# Patient Record
Sex: Male | Born: 1950
Health system: Southern US, Community
[De-identification: ages and names within clinical notes are randomized; demographics above are authoritative.]

## PROBLEM LIST (undated history)

## (undated) DIAGNOSIS — T402X5A Adverse effect of other opioids, initial encounter: Secondary | ICD-10-CM

## (undated) DIAGNOSIS — R319 Hematuria, unspecified: Secondary | ICD-10-CM

## (undated) DIAGNOSIS — E785 Hyperlipidemia, unspecified: Secondary | ICD-10-CM

## (undated) DIAGNOSIS — F329 Major depressive disorder, single episode, unspecified: Secondary | ICD-10-CM

## (undated) DIAGNOSIS — K219 Gastro-esophageal reflux disease without esophagitis: Secondary | ICD-10-CM

## (undated) DIAGNOSIS — C9110 Chronic lymphocytic leukemia of B-cell type not having achieved remission: Secondary | ICD-10-CM

## (undated) DIAGNOSIS — K5900 Constipation, unspecified: Secondary | ICD-10-CM

## (undated) DIAGNOSIS — I10 Essential (primary) hypertension: Secondary | ICD-10-CM

## (undated) DIAGNOSIS — E119 Type 2 diabetes mellitus without complications: Secondary | ICD-10-CM

## (undated) DIAGNOSIS — F32A Depression, unspecified: Secondary | ICD-10-CM

## (undated) DIAGNOSIS — K5903 Drug induced constipation: Secondary | ICD-10-CM

## (undated) HISTORY — PX: CATARACT EXTRACTION W/ INTRAOCULAR LENS IMPLANT: SHX1309

## (undated) HISTORY — DX: Chronic lymphocytic leukemia of B-cell type not having achieved remission: C91.10

## (undated) HISTORY — DX: Drug induced constipation: K59.03

## (undated) HISTORY — DX: Adverse effect of other opioids, initial encounter: T40.2X5A

## (undated) HISTORY — DX: Hematuria, unspecified: R31.9

## (undated) HISTORY — DX: Essential (primary) hypertension: I10

## (undated) HISTORY — DX: Hyperlipidemia, unspecified: E78.5

## (undated) HISTORY — DX: Constipation, unspecified: K59.00

## (undated) HISTORY — DX: Depression, unspecified: F32.A

---
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
## 1981-04-29 HISTORY — PX: CHOLECYSTECTOMY: SHX55

## 2004-08-17 ENCOUNTER — Ambulatory Visit: Payer: Self-pay | Admitting: Urology

## 2004-09-04 ENCOUNTER — Other Ambulatory Visit: Payer: Self-pay

## 2004-09-10 ENCOUNTER — Ambulatory Visit: Payer: Self-pay | Admitting: Urology

## 2004-10-31 ENCOUNTER — Ambulatory Visit: Payer: Self-pay | Admitting: Internal Medicine

## 2004-11-07 ENCOUNTER — Ambulatory Visit: Payer: Self-pay | Admitting: Urology

## 2004-11-27 ENCOUNTER — Ambulatory Visit: Payer: Self-pay | Admitting: Internal Medicine

## 2004-12-28 ENCOUNTER — Ambulatory Visit: Payer: Self-pay | Admitting: Internal Medicine

## 2005-06-05 ENCOUNTER — Ambulatory Visit: Payer: Self-pay | Admitting: Internal Medicine

## 2005-06-19 ENCOUNTER — Ambulatory Visit: Payer: Self-pay | Admitting: Internal Medicine

## 2005-07-17 ENCOUNTER — Ambulatory Visit: Payer: Self-pay | Admitting: Internal Medicine

## 2005-09-18 ENCOUNTER — Ambulatory Visit: Payer: Self-pay | Admitting: Internal Medicine

## 2005-09-27 ENCOUNTER — Ambulatory Visit: Payer: Self-pay | Admitting: Internal Medicine

## 2005-12-11 ENCOUNTER — Ambulatory Visit: Payer: Self-pay | Admitting: Internal Medicine

## 2005-12-28 ENCOUNTER — Ambulatory Visit: Payer: Self-pay | Admitting: Internal Medicine

## 2006-06-28 ENCOUNTER — Ambulatory Visit: Payer: Self-pay | Admitting: Internal Medicine

## 2006-07-02 ENCOUNTER — Ambulatory Visit: Payer: Self-pay | Admitting: Internal Medicine

## 2006-07-29 ENCOUNTER — Ambulatory Visit: Payer: Self-pay | Admitting: Internal Medicine

## 2006-10-15 ENCOUNTER — Ambulatory Visit: Payer: Self-pay | Admitting: Internal Medicine

## 2006-10-28 ENCOUNTER — Ambulatory Visit: Payer: Self-pay | Admitting: Internal Medicine

## 2007-01-05 ENCOUNTER — Ambulatory Visit: Payer: Self-pay | Admitting: Internal Medicine

## 2007-01-06 ENCOUNTER — Ambulatory Visit: Payer: Self-pay | Admitting: Internal Medicine

## 2007-02-17 ENCOUNTER — Ambulatory Visit: Payer: Self-pay | Admitting: Unknown Physician Specialty

## 2007-07-29 ENCOUNTER — Ambulatory Visit: Payer: Self-pay | Admitting: Internal Medicine

## 2007-08-28 ENCOUNTER — Ambulatory Visit: Payer: Self-pay | Admitting: Internal Medicine

## 2007-09-30 ENCOUNTER — Ambulatory Visit: Payer: Self-pay | Admitting: Internal Medicine

## 2007-10-28 ENCOUNTER — Ambulatory Visit: Payer: Self-pay | Admitting: Internal Medicine

## 2007-12-02 ENCOUNTER — Ambulatory Visit: Payer: Self-pay | Admitting: Internal Medicine

## 2007-12-29 ENCOUNTER — Ambulatory Visit: Payer: Self-pay | Admitting: Internal Medicine

## 2008-01-28 ENCOUNTER — Ambulatory Visit: Payer: Self-pay | Admitting: Internal Medicine

## 2008-02-03 ENCOUNTER — Ambulatory Visit: Payer: Self-pay | Admitting: Internal Medicine

## 2008-02-28 ENCOUNTER — Ambulatory Visit: Payer: Self-pay | Admitting: Internal Medicine

## 2008-04-29 ENCOUNTER — Ambulatory Visit: Payer: Self-pay | Admitting: Internal Medicine

## 2008-05-18 ENCOUNTER — Ambulatory Visit: Payer: Self-pay | Admitting: Internal Medicine

## 2008-05-30 ENCOUNTER — Ambulatory Visit: Payer: Self-pay | Admitting: Internal Medicine

## 2008-07-28 ENCOUNTER — Ambulatory Visit: Payer: Self-pay | Admitting: Internal Medicine

## 2008-08-10 ENCOUNTER — Ambulatory Visit: Payer: Self-pay | Admitting: Internal Medicine

## 2008-08-27 ENCOUNTER — Ambulatory Visit: Payer: Self-pay | Admitting: Internal Medicine

## 2008-09-29 ENCOUNTER — Ambulatory Visit: Payer: Self-pay | Admitting: Unknown Physician Specialty

## 2008-11-09 ENCOUNTER — Ambulatory Visit: Payer: Self-pay | Admitting: Ophthalmology

## 2008-11-22 ENCOUNTER — Ambulatory Visit: Payer: Self-pay | Admitting: Ophthalmology

## 2009-02-08 ENCOUNTER — Ambulatory Visit: Payer: Self-pay | Admitting: Internal Medicine

## 2009-02-27 ENCOUNTER — Ambulatory Visit: Payer: Self-pay | Admitting: Internal Medicine

## 2009-07-28 ENCOUNTER — Ambulatory Visit: Payer: Self-pay | Admitting: Internal Medicine

## 2009-08-27 ENCOUNTER — Ambulatory Visit: Payer: Self-pay | Admitting: Internal Medicine

## 2009-09-27 ENCOUNTER — Ambulatory Visit: Payer: Self-pay | Admitting: Internal Medicine

## 2009-10-27 ENCOUNTER — Ambulatory Visit: Payer: Self-pay | Admitting: Internal Medicine

## 2009-11-27 ENCOUNTER — Ambulatory Visit: Payer: Self-pay | Admitting: Internal Medicine

## 2009-12-28 ENCOUNTER — Ambulatory Visit: Payer: Self-pay | Admitting: Internal Medicine

## 2010-01-27 ENCOUNTER — Ambulatory Visit: Payer: Self-pay | Admitting: Internal Medicine

## 2010-02-27 ENCOUNTER — Ambulatory Visit: Payer: Self-pay | Admitting: Internal Medicine

## 2010-03-29 ENCOUNTER — Ambulatory Visit: Payer: Self-pay | Admitting: Internal Medicine

## 2010-04-29 ENCOUNTER — Ambulatory Visit: Payer: Self-pay | Admitting: Internal Medicine

## 2010-05-30 ENCOUNTER — Ambulatory Visit: Payer: Self-pay | Admitting: Internal Medicine

## 2010-06-26 ENCOUNTER — Ambulatory Visit: Payer: Self-pay | Admitting: Internal Medicine

## 2010-07-03 ENCOUNTER — Ambulatory Visit: Payer: Self-pay | Admitting: Internal Medicine

## 2010-07-29 ENCOUNTER — Ambulatory Visit: Payer: Self-pay | Admitting: Internal Medicine

## 2010-08-28 ENCOUNTER — Ambulatory Visit: Payer: Self-pay | Admitting: Internal Medicine

## 2010-10-04 ENCOUNTER — Ambulatory Visit: Payer: Self-pay | Admitting: Internal Medicine

## 2010-10-28 ENCOUNTER — Ambulatory Visit: Payer: Self-pay | Admitting: Internal Medicine

## 2010-11-28 ENCOUNTER — Ambulatory Visit: Payer: Self-pay | Admitting: Internal Medicine

## 2010-12-26 ENCOUNTER — Ambulatory Visit: Payer: Self-pay | Admitting: Physician Assistant

## 2010-12-29 ENCOUNTER — Ambulatory Visit: Payer: Self-pay | Admitting: Internal Medicine

## 2011-01-28 ENCOUNTER — Ambulatory Visit: Payer: Self-pay | Admitting: Internal Medicine

## 2011-03-28 ENCOUNTER — Ambulatory Visit: Payer: Self-pay | Admitting: Internal Medicine

## 2011-03-30 ENCOUNTER — Ambulatory Visit: Payer: Self-pay | Admitting: Internal Medicine

## 2011-06-19 ENCOUNTER — Ambulatory Visit: Payer: Self-pay | Admitting: Internal Medicine

## 2011-06-19 LAB — CBC CANCER CENTER
Basophil %: 1.4 %
Eosinophil #: 0.3 x10 3/mm (ref 0.0–0.7)
Eosinophil %: 5.6 %
HCT: 36.3 % — ABNORMAL LOW (ref 40.0–52.0)
HGB: 12 g/dL — ABNORMAL LOW (ref 13.0–18.0)
Lymphocyte %: 23.4 %
MCH: 33.6 pg (ref 26.0–34.0)
Monocyte %: 11.9 %
Neutrophil %: 57.7 %
Platelet: 114 x10 3/mm — ABNORMAL LOW (ref 150–440)
RBC: 3.57 10*6/uL — ABNORMAL LOW (ref 4.40–5.90)

## 2011-06-28 ENCOUNTER — Ambulatory Visit: Payer: Self-pay | Admitting: Internal Medicine

## 2011-09-11 ENCOUNTER — Ambulatory Visit: Payer: Self-pay | Admitting: Internal Medicine

## 2011-09-11 LAB — CBC CANCER CENTER
Basophil #: 0.2 x10 3/mm — ABNORMAL HIGH (ref 0.0–0.1)
Eosinophil #: 0.2 x10 3/mm (ref 0.0–0.7)
HCT: 35.6 % — ABNORMAL LOW (ref 40.0–52.0)
Lymphocyte %: 25.8 %
MCH: 33.7 pg (ref 26.0–34.0)
MCHC: 32.1 g/dL (ref 32.0–36.0)
Neutrophil #: 3.3 x10 3/mm (ref 1.4–6.5)
RBC: 3.39 10*6/uL — ABNORMAL LOW (ref 4.40–5.90)
WBC: 5.7 x10 3/mm (ref 3.8–10.6)

## 2011-09-11 LAB — HEPATIC FUNCTION PANEL A (ARMC)
Alkaline Phosphatase: 98 U/L (ref 50–136)
Bilirubin, Direct: 0.1 mg/dL (ref 0.00–0.20)
Bilirubin,Total: 0.3 mg/dL (ref 0.2–1.0)
SGPT (ALT): 38 U/L
Total Protein: 7.1 g/dL (ref 6.4–8.2)

## 2011-09-11 LAB — LACTATE DEHYDROGENASE: LDH: 138 U/L (ref 87–241)

## 2011-09-28 ENCOUNTER — Ambulatory Visit: Payer: Self-pay | Admitting: Internal Medicine

## 2011-12-04 ENCOUNTER — Ambulatory Visit: Payer: Self-pay | Admitting: Internal Medicine

## 2011-12-04 LAB — CBC CANCER CENTER
Basophil %: 1.2 %
Eosinophil %: 3.1 %
HCT: 35.1 % — ABNORMAL LOW (ref 40.0–52.0)
Lymphocyte #: 2.1 x10 3/mm (ref 1.0–3.6)
MCH: 34.6 pg — ABNORMAL HIGH (ref 26.0–34.0)
MCV: 105 fL — ABNORMAL HIGH (ref 80–100)
Monocyte #: 0.7 x10 3/mm (ref 0.2–1.0)
Monocyte %: 9.4 %
Neutrophil #: 4.2 x10 3/mm (ref 1.4–6.5)
Platelet: 137 x10 3/mm — ABNORMAL LOW (ref 150–440)
RBC: 3.33 10*6/uL — ABNORMAL LOW (ref 4.40–5.90)
RDW: 17.1 % — ABNORMAL HIGH (ref 11.5–14.5)
WBC: 7.3 x10 3/mm (ref 3.8–10.6)

## 2011-12-29 ENCOUNTER — Ambulatory Visit: Payer: Self-pay | Admitting: Internal Medicine

## 2011-12-30 ENCOUNTER — Emergency Department: Payer: Self-pay | Admitting: Emergency Medicine

## 2012-03-04 ENCOUNTER — Ambulatory Visit: Payer: Self-pay | Admitting: Internal Medicine

## 2012-03-04 LAB — HEPATIC FUNCTION PANEL A (ARMC)
Albumin: 3.6 g/dL (ref 3.4–5.0)
Alkaline Phosphatase: 99 U/L (ref 50–136)
Bilirubin, Direct: 0.1 mg/dL (ref 0.00–0.20)
Bilirubin,Total: 0.4 mg/dL (ref 0.2–1.0)
SGPT (ALT): 40 U/L (ref 12–78)
Total Protein: 7.3 g/dL (ref 6.4–8.2)

## 2012-03-04 LAB — LACTATE DEHYDROGENASE: LDH: 132 U/L (ref 85–241)

## 2012-03-04 LAB — CBC CANCER CENTER
Basophil #: 0.2 x10 3/mm — ABNORMAL HIGH (ref 0.0–0.1)
Basophil %: 1.6 %
Eosinophil #: 0.2 x10 3/mm (ref 0.0–0.7)
HCT: 34.6 % — ABNORMAL LOW (ref 40.0–52.0)
HGB: 11 g/dL — ABNORMAL LOW (ref 13.0–18.0)
Lymphocyte #: 4.4 x10 3/mm — ABNORMAL HIGH (ref 1.0–3.6)
Lymphocyte %: 42.5 %
MCH: 32.8 pg (ref 26.0–34.0)
MCHC: 31.9 g/dL — ABNORMAL LOW (ref 32.0–36.0)
Monocyte #: 0.6 x10 3/mm (ref 0.2–1.0)
Neutrophil #: 5 x10 3/mm (ref 1.4–6.5)
RBC: 3.36 10*6/uL — ABNORMAL LOW (ref 4.40–5.90)
RDW: 16.6 % — ABNORMAL HIGH (ref 11.5–14.5)

## 2012-03-04 LAB — CREATININE, SERUM: Creatinine: 1.56 mg/dL — ABNORMAL HIGH (ref 0.60–1.30)

## 2012-03-29 ENCOUNTER — Ambulatory Visit: Payer: Self-pay | Admitting: Internal Medicine

## 2012-04-29 ENCOUNTER — Ambulatory Visit: Payer: Self-pay | Admitting: Internal Medicine

## 2012-05-27 LAB — CBC CANCER CENTER
Eosinophil #: 0.3 x10 3/mm (ref 0.0–0.7)
Eosinophil %: 1.7 %
HCT: 34.8 % — ABNORMAL LOW (ref 40.0–52.0)
HGB: 11.4 g/dL — ABNORMAL LOW (ref 13.0–18.0)
Lymphocyte #: 10.8 x10 3/mm — ABNORMAL HIGH (ref 1.0–3.6)
Lymphocyte %: 63.9 %
MCH: 32.9 pg (ref 26.0–34.0)
MCHC: 32.7 g/dL (ref 32.0–36.0)
MCV: 101 fL — ABNORMAL HIGH (ref 80–100)
Neutrophil #: 5 x10 3/mm (ref 1.4–6.5)
RBC: 3.45 10*6/uL — ABNORMAL LOW (ref 4.40–5.90)
RDW: 16.6 % — ABNORMAL HIGH (ref 11.5–14.5)

## 2012-05-30 ENCOUNTER — Ambulatory Visit: Payer: Self-pay | Admitting: Internal Medicine

## 2012-06-04 ENCOUNTER — Ambulatory Visit: Payer: Self-pay | Admitting: Unknown Physician Specialty

## 2012-07-07 ENCOUNTER — Ambulatory Visit: Payer: Self-pay | Admitting: Internal Medicine

## 2012-08-18 ENCOUNTER — Ambulatory Visit: Payer: Self-pay | Admitting: Internal Medicine

## 2012-08-19 LAB — CBC CANCER CENTER
Basophil #: 0.1 x10 3/mm (ref 0.0–0.1)
Basophil %: 0.5 %
Eosinophil #: 0.4 x10 3/mm (ref 0.0–0.7)
Eosinophil %: 1.4 %
HCT: 34.9 % — ABNORMAL LOW (ref 40.0–52.0)
HGB: 11.2 g/dL — ABNORMAL LOW (ref 13.0–18.0)
Lymphocyte %: 75.7 %
MCH: 32.1 pg (ref 26.0–34.0)
MCV: 100 fL (ref 80–100)
Monocyte #: 0.9 x10 3/mm (ref 0.2–1.0)
Neutrophil #: 4.8 x10 3/mm (ref 1.4–6.5)
Platelet: 141 x10 3/mm — ABNORMAL LOW (ref 150–440)
RBC: 3.48 10*6/uL — ABNORMAL LOW (ref 4.40–5.90)
RDW: 18.1 % — ABNORMAL HIGH (ref 11.5–14.5)
WBC: 25.5 x10 3/mm — ABNORMAL HIGH (ref 3.8–10.6)

## 2012-08-19 LAB — HEPATIC FUNCTION PANEL A (ARMC)
Albumin: 3.9 g/dL (ref 3.4–5.0)
Alkaline Phosphatase: 104 U/L (ref 50–136)
Bilirubin,Total: 0.5 mg/dL (ref 0.2–1.0)
SGOT(AST): 20 U/L (ref 15–37)
SGPT (ALT): 26 U/L (ref 12–78)
Total Protein: 7.6 g/dL (ref 6.4–8.2)

## 2012-08-19 LAB — CREATININE, SERUM
EGFR (African American): >60
EGFR (Non-African Amer.): 54 — ABNORMAL LOW

## 2012-08-19 LAB — LACTATE DEHYDROGENASE: LDH: 136 U/L (ref 85–241)

## 2012-08-27 ENCOUNTER — Ambulatory Visit: Payer: Self-pay | Admitting: Internal Medicine

## 2012-10-27 ENCOUNTER — Ambulatory Visit: Payer: Self-pay | Admitting: Internal Medicine

## 2012-11-11 LAB — CBC CANCER CENTER
Basophil #: 0.2 x10 3/mm — ABNORMAL HIGH (ref 0.0–0.1)
Eosinophil %: 0.7 %
Lymphocyte #: 23.6 x10 3/mm — ABNORMAL HIGH (ref 1.0–3.6)
Lymphocyte %: 73.9 %
MCH: 33 pg (ref 26.0–34.0)
Platelet: 137 x10 3/mm — ABNORMAL LOW (ref 150–440)
RDW: 17.8 % — ABNORMAL HIGH (ref 11.5–14.5)

## 2012-11-27 ENCOUNTER — Ambulatory Visit: Payer: Self-pay | Admitting: Internal Medicine

## 2013-02-03 ENCOUNTER — Ambulatory Visit: Payer: Self-pay | Admitting: Internal Medicine

## 2013-02-03 LAB — CBC CANCER CENTER
Basophil #: 0.2 x10 3/mm — ABNORMAL HIGH (ref 0.0–0.1)
Basophil %: 0.6 %
Eosinophil #: 0.3 x10 3/mm (ref 0.0–0.7)
MCHC: 32.8 g/dL (ref 32.0–36.0)
MCV: 104 fL — ABNORMAL HIGH (ref 80–100)
Monocyte #: 1.3 x10 3/mm — ABNORMAL HIGH (ref 0.2–1.0)
RDW: 17 % — ABNORMAL HIGH (ref 11.5–14.5)

## 2013-02-03 LAB — HEPATIC FUNCTION PANEL A (ARMC)
Albumin: 4 g/dL (ref 3.4–5.0)
Alkaline Phosphatase: 102 U/L (ref 50–136)
Bilirubin, Direct: 0.1 mg/dL (ref 0.00–0.20)
SGPT (ALT): 25 U/L (ref 12–78)
Total Protein: 7.5 g/dL (ref 6.4–8.2)

## 2013-02-03 LAB — CREATININE, SERUM: EGFR (Non-African Amer.): 48 — ABNORMAL LOW

## 2013-02-03 LAB — LACTATE DEHYDROGENASE: LDH: 182 U/L (ref 85–241)

## 2013-02-27 ENCOUNTER — Ambulatory Visit: Payer: Self-pay | Admitting: Internal Medicine

## 2013-04-26 ENCOUNTER — Ambulatory Visit: Payer: Self-pay | Admitting: Internal Medicine

## 2013-05-05 ENCOUNTER — Ambulatory Visit: Payer: Self-pay | Admitting: Internal Medicine

## 2013-05-05 LAB — CBC CANCER CENTER
BANDS NEUTROPHIL: 1 %
BASOS ABS: 1 %
EOS PCT: 1 %
HCT: 32.6 % — AB (ref 40.0–52.0)
HGB: 10.2 g/dL — AB (ref 13.0–18.0)
Lymphocytes: 78 %
MCH: 32.7 pg (ref 26.0–34.0)
MCHC: 31.4 g/dL — AB (ref 32.0–36.0)
MCV: 104 fL — ABNORMAL HIGH (ref 80–100)
Monocytes: 5 %
Platelet: 147 x10 3/mm — ABNORMAL LOW (ref 150–440)
RBC: 3.13 10*6/uL — AB (ref 4.40–5.90)
RDW: 16.4 % — ABNORMAL HIGH (ref 11.5–14.5)
Segmented Neutrophils: 10 %
VARIANT LYMPHOCYTE - H4-RLYMPH: 4 %
WBC: 44.5 x10 3/mm — AB (ref 3.8–10.6)

## 2013-05-30 ENCOUNTER — Ambulatory Visit: Payer: Self-pay | Admitting: Internal Medicine

## 2013-07-20 ENCOUNTER — Ambulatory Visit: Payer: Self-pay | Admitting: Internal Medicine

## 2013-07-21 LAB — CBC CANCER CENTER
BANDS NEUTROPHIL: 1 %
HCT: 30.1 % — ABNORMAL LOW (ref 40.0–52.0)
HGB: 9.2 g/dL — ABNORMAL LOW (ref 13.0–18.0)
LYMPHS PCT: 84 %
MCH: 31.9 pg (ref 26.0–34.0)
MCHC: 30.4 g/dL — ABNORMAL LOW (ref 32.0–36.0)
MCV: 105 fL — AB (ref 80–100)
Monocytes: 2 %
Platelet: 127 x10 3/mm — ABNORMAL LOW (ref 150–440)
RBC: 2.87 10*6/uL — ABNORMAL LOW (ref 4.40–5.90)
RDW: 18.5 % — AB (ref 11.5–14.5)
Segmented Neutrophils: 9 %
Variant Lymphocyte: 4 %
WBC: 55.8 x10 3/mm — ABNORMAL HIGH (ref 3.8–10.6)

## 2013-07-21 LAB — HEPATIC FUNCTION PANEL A (ARMC)
AST: 15 U/L (ref 15–37)
Albumin: 3.7 g/dL (ref 3.4–5.0)
Alkaline Phosphatase: 91 U/L
Bilirubin, Direct: 0.1 mg/dL (ref 0.00–0.20)
Bilirubin,Total: 0.3 mg/dL (ref 0.2–1.0)
SGPT (ALT): 21 U/L (ref 12–78)
TOTAL PROTEIN: 7 g/dL (ref 6.4–8.2)

## 2013-07-21 LAB — LACTATE DEHYDROGENASE: LDH: 156 U/L (ref 85–241)

## 2013-07-21 LAB — CREATININE, SERUM
Creatinine: 1.37 mg/dL — ABNORMAL HIGH (ref 0.60–1.30)
EGFR (African American): >60
EGFR (Non-African Amer.): 54 — ABNORMAL LOW

## 2013-07-22 ENCOUNTER — Ambulatory Visit: Payer: Self-pay | Admitting: Ophthalmology

## 2013-07-22 LAB — CBC WITH DIFFERENTIAL/PLATELET
BANDS NEUTROPHIL: 1 %
Basophil: 1 %
Eosinophil: 1 %
HCT: 29.8 % — ABNORMAL LOW (ref 40.0–52.0)
HGB: 9.2 g/dL — ABNORMAL LOW (ref 13.0–18.0)
LYMPHS PCT: 18 %
MCH: 32.7 pg (ref 26.0–34.0)
MCHC: 30.9 g/dL — ABNORMAL LOW (ref 32.0–36.0)
MCV: 106 fL — ABNORMAL HIGH (ref 80–100)
PLATELETS: 126 10*3/uL — AB (ref 150–440)
RBC: 2.81 10*6/uL — ABNORMAL LOW (ref 4.40–5.90)
RDW: 18.2 % — AB (ref 11.5–14.5)
Segmented Neutrophils: 13 %
Variant Lymphocyte - H1-Rlymph: 66 %
WBC: 52.6 10*3/uL — ABNORMAL HIGH (ref 3.8–10.6)

## 2013-07-22 LAB — POTASSIUM: Potassium: 4.8 mmol/L (ref 3.5–5.1)

## 2013-07-28 ENCOUNTER — Ambulatory Visit: Payer: Self-pay | Admitting: Internal Medicine

## 2013-08-03 ENCOUNTER — Ambulatory Visit: Payer: Self-pay | Admitting: Ophthalmology

## 2013-10-13 ENCOUNTER — Ambulatory Visit: Payer: Self-pay | Admitting: Internal Medicine

## 2013-10-14 LAB — CBC CANCER CENTER
BASOS ABS: 0.2 x10 3/mm — AB (ref 0.0–0.1)
BASOS PCT: 0.2 %
Eosinophil #: 0.4 x10 3/mm (ref 0.0–0.7)
Eosinophil %: 0.3 %
HCT: 31.3 % — ABNORMAL LOW (ref 40.0–52.0)
HGB: 9.5 g/dL — AB (ref 13.0–18.0)
LYMPHS PCT: 93 %
Lymphocyte #: 97.1 x10 3/mm — ABNORMAL HIGH (ref 1.0–3.6)
MCH: 33.5 pg (ref 26.0–34.0)
MCHC: 30.2 g/dL — AB (ref 32.0–36.0)
MCV: 111 fL — AB (ref 80–100)
MONO ABS: 1.8 x10 3/mm — AB (ref 0.2–1.0)
MONOS PCT: 1.7 %
Neutrophil #: 5 x10 3/mm (ref 1.4–6.5)
Neutrophil %: 4.8 %
PLATELETS: 89 x10 3/mm — AB (ref 150–440)
RBC: 2.83 10*6/uL — AB (ref 4.40–5.90)
RDW: 18.5 % — AB (ref 11.5–14.5)
WBC: 104.4 x10 3/mm — ABNORMAL HIGH (ref 3.8–10.6)

## 2013-10-27 ENCOUNTER — Ambulatory Visit: Payer: Self-pay | Admitting: Internal Medicine

## 2013-11-16 ENCOUNTER — Inpatient Hospital Stay: Payer: Self-pay | Admitting: Internal Medicine

## 2013-11-16 LAB — CBC WITH DIFFERENTIAL/PLATELET
Bands: 1 %
Eosinophil: 1 %
HCT: 28.3 % — ABNORMAL LOW (ref 40.0–52.0)
HGB: 8.3 g/dL — ABNORMAL LOW (ref 13.0–18.0)
LYMPHS PCT: 35 %
MCH: 33.7 pg (ref 26.0–34.0)
MCHC: 29.5 g/dL — AB (ref 32.0–36.0)
MCV: 114 fL — ABNORMAL HIGH (ref 80–100)
MONOS PCT: 2 %
OTHER CELLS BLOOD: 41
PLATELETS: 110 10*3/uL — AB (ref 150–440)
RBC: 2.48 10*6/uL — AB (ref 4.40–5.90)
RDW: 19.4 % — AB (ref 11.5–14.5)
Segmented Neutrophils: 7 %
VARIANT LYMPHOCYTE - H1-RLYMPH: 13 %
WBC: 239.6 10*3/uL — AB (ref 3.8–10.6)

## 2013-11-16 LAB — TROPONIN I

## 2013-11-16 LAB — COMPREHENSIVE METABOLIC PANEL
ALT: 35 U/L (ref 12–78)
Albumin: 4.4 g/dL (ref 3.4–5.0)
Alkaline Phosphatase: 107 U/L
Anion Gap: 9 (ref 7–16)
BUN: 52 mg/dL — AB (ref 7–18)
Bilirubin,Total: 0.2 mg/dL (ref 0.2–1.0)
CHLORIDE: 109 mmol/L — AB (ref 98–107)
CO2: 17 mmol/L — AB (ref 21–32)
Calcium, Total: 9 mg/dL (ref 8.5–10.1)
Creatinine: 2.28 mg/dL — ABNORMAL HIGH (ref 0.60–1.30)
GFR CALC AF AMER: 34 — AB
GFR CALC NON AF AMER: 29 — AB
Glucose: 140 mg/dL — ABNORMAL HIGH (ref 65–99)
Osmolality: 286 (ref 275–301)
POTASSIUM: 5.2 mmol/L — AB (ref 3.5–5.1)
SGOT(AST): 29 U/L (ref 15–37)
Sodium: 135 mmol/L — ABNORMAL LOW (ref 136–145)
Total Protein: 8.6 g/dL — ABNORMAL HIGH (ref 6.4–8.2)

## 2013-11-16 LAB — LIPASE, BLOOD: Lipase: 522 U/L — ABNORMAL HIGH (ref 73–393)

## 2013-11-17 LAB — BASIC METABOLIC PANEL
Anion Gap: 9 (ref 7–16)
BUN: 45 mg/dL — ABNORMAL HIGH (ref 7–18)
CREATININE: 1.79 mg/dL — AB (ref 0.60–1.30)
Calcium, Total: 8 mg/dL — ABNORMAL LOW (ref 8.5–10.1)
Chloride: 113 mmol/L — ABNORMAL HIGH (ref 98–107)
Co2: 16 mmol/L — ABNORMAL LOW (ref 21–32)
GFR CALC AF AMER: 45 — AB
GFR CALC NON AF AMER: 39 — AB
GLUCOSE: 80 mg/dL (ref 65–99)
Osmolality: 286 (ref 275–301)
POTASSIUM: 4.5 mmol/L (ref 3.5–5.1)
SODIUM: 138 mmol/L (ref 136–145)

## 2013-11-17 LAB — CLOSTRIDIUM DIFFICILE(ARMC)

## 2013-11-18 LAB — CBC WITH DIFFERENTIAL/PLATELET
BASOS PCT: 0.4 %
Bands: 2 %
Bands: 2 %
Basophil #: 0.5 10*3/uL — ABNORMAL HIGH (ref 0.0–0.1)
Eosinophil #: 0.5 10*3/uL (ref 0.0–0.7)
Eosinophil %: 0.4 %
HCT: 21 % — ABNORMAL LOW (ref 40.0–52.0)
HCT: 20.4 % — ABNORMAL LOW (ref 40.0–52.0)
HGB: 5.9 g/dL — ABNORMAL LOW (ref 13.0–18.0)
HGB: 5.8 g/dL — ABNORMAL LOW (ref 13.0–18.0)
LYMPHS PCT: 92 %
Lymphocyte #: >100 10*3/uL (ref 1.0–3.6)
Lymphocytes: 87 %
MCH: 32.7 pg (ref 26.0–34.0)
MCH: 32.6 pg (ref 26.0–34.0)
MCHC: 28.2 g/dL — ABNORMAL LOW (ref 32.0–36.0)
MCHC: 28.3 g/dL — ABNORMAL LOW (ref 32.0–36.0)
MCV: 116 fL — ABNORMAL HIGH (ref 80–100)
MCV: 115 fL — ABNORMAL HIGH (ref 80–100)
MONO ABS: 2 x10 3/mm — AB (ref 0.2–1.0)
MONOS PCT: 1.7 %
MONOS PCT: 4 %
Metamyelocyte: 2 %
NEUTROS ABS: 6.4 10*3/uL (ref 1.4–6.5)
Neutrophil %: 5.5 %
Platelet: 59 10*3/uL — ABNORMAL LOW (ref 150–440)
Platelet: 58 10*3/uL — ABNORMAL LOW (ref 150–440)
RBC: 1.81 10*6/uL — ABNORMAL LOW (ref 4.40–5.90)
RBC: 1.77 10*6/uL — ABNORMAL LOW (ref 4.40–5.90)
RDW: 18.6 % — AB (ref 11.5–14.5)
RDW: 18.3 % — ABNORMAL HIGH (ref 11.5–14.5)
SEGMENTED NEUTROPHILS: 5 %
WBC: 115.2 10*3/uL — ABNORMAL HIGH (ref 3.8–10.6)
WBC: 111.2 10*3/uL — ABNORMAL HIGH (ref 3.8–10.6)

## 2013-11-18 LAB — BASIC METABOLIC PANEL
Anion Gap: 7 (ref 7–16)
BUN: 30 mg/dL — AB (ref 7–18)
CALCIUM: 7.4 mg/dL — AB (ref 8.5–10.1)
Chloride: 111 mmol/L — ABNORMAL HIGH (ref 98–107)
Co2: 20 mmol/L — ABNORMAL LOW (ref 21–32)
Creatinine: 1.45 mg/dL — ABNORMAL HIGH (ref 0.60–1.30)
GFR CALC AF AMER: 59 — AB
GFR CALC NON AF AMER: 51 — AB
GLUCOSE: 108 mg/dL — AB (ref 65–99)
Osmolality: 282 (ref 275–301)
Potassium: 3.9 mmol/L (ref 3.5–5.1)
SODIUM: 138 mmol/L (ref 136–145)

## 2013-11-18 LAB — IRON AND TIBC
IRON: 187 ug/dL — AB (ref 65–175)
Iron Bind.Cap.(Total): 306 ug/dL (ref 250–450)
Iron Saturation: 61 %
Unbound Iron-Bind.Cap.: 119 ug/dL

## 2013-11-18 LAB — FERRITIN: Ferritin (ARMC): 381 ng/mL (ref 8–388)

## 2013-11-19 LAB — BASIC METABOLIC PANEL
Anion Gap: 8 (ref 7–16)
BUN: 20 mg/dL — ABNORMAL HIGH (ref 7–18)
CO2: 21 mmol/L (ref 21–32)
Calcium, Total: 7.6 mg/dL — ABNORMAL LOW (ref 8.5–10.1)
Chloride: 113 mmol/L — ABNORMAL HIGH (ref 98–107)
Creatinine: 1.29 mg/dL (ref 0.60–1.30)
EGFR (African American): >60
EGFR (Non-African Amer.): 58 — ABNORMAL LOW
Glucose: 115 mg/dL — ABNORMAL HIGH (ref 65–99)
Osmolality: 287 (ref 275–301)
POTASSIUM: 4.6 mmol/L (ref 3.5–5.1)
SODIUM: 142 mmol/L (ref 136–145)

## 2013-11-19 LAB — CBC WITH DIFFERENTIAL/PLATELET
Bands: 1 %
COMMENT - H1-COM4: NORMAL
HCT: 26.2 % — ABNORMAL LOW (ref 40.0–52.0)
HGB: 7.9 g/dL — AB (ref 13.0–18.0)
Lymphocytes: 92 %
MCH: 31.3 pg (ref 26.0–34.0)
MCHC: 30.3 g/dL — ABNORMAL LOW (ref 32.0–36.0)
MCV: 103 fL — AB (ref 80–100)
Monocytes: 3 %
PLATELETS: 59 10*3/uL — AB (ref 150–440)
RBC: 2.53 10*6/uL — ABNORMAL LOW (ref 4.40–5.90)
RDW: 23.6 % — AB (ref 11.5–14.5)
SEGMENTED NEUTROPHILS: 4 %
WBC: 109.9 10*3/uL — AB (ref 3.8–10.6)

## 2013-11-19 LAB — OCCULT BLOOD X 1 CARD TO LAB, STOOL: Occult Blood, Feces: NEGATIVE

## 2013-11-21 LAB — STOOL CULTURE

## 2013-11-23 LAB — CBC CANCER CENTER
Bands: 1 %
HCT: 29.1 % — ABNORMAL LOW (ref 40.0–52.0)
HGB: 9 g/dL — AB (ref 13.0–18.0)
LYMPHS PCT: 80 %
MCH: 32.8 pg (ref 26.0–34.0)
MCHC: 31 g/dL — AB (ref 32.0–36.0)
MCV: 106 fL — AB (ref 80–100)
MONOS PCT: 1 %
PLATELETS: 98 x10 3/mm — AB (ref 150–440)
RBC: 2.76 10*6/uL — ABNORMAL LOW (ref 4.40–5.90)
RDW: 23.4 % — ABNORMAL HIGH (ref 11.5–14.5)
Segmented Neutrophils: 2 %
Variant Lymphocyte: 16 %
WBC: 187.9 x10 3/mm — ABNORMAL HIGH (ref 3.8–10.6)

## 2013-11-23 LAB — BASIC METABOLIC PANEL
Anion Gap: 11 (ref 7–16)
BUN: 30 mg/dL — AB (ref 7–18)
CALCIUM: 9.3 mg/dL (ref 8.5–10.1)
CREATININE: 1.44 mg/dL — AB (ref 0.60–1.30)
Chloride: 118 mmol/L — ABNORMAL HIGH (ref 98–107)
Co2: 23 mmol/L (ref 21–32)
EGFR (African American): 59 — ABNORMAL LOW
GFR CALC NON AF AMER: 51 — AB
Glucose: 105 mg/dL — ABNORMAL HIGH (ref 65–99)
Osmolality: 308 (ref 275–301)
Potassium: 4.8 mmol/L (ref 3.5–5.1)
SODIUM: 152 mmol/L — AB (ref 136–145)

## 2013-11-23 LAB — URIC ACID: Uric Acid: 8.9 mg/dL — ABNORMAL HIGH (ref 3.5–7.2)

## 2013-11-27 ENCOUNTER — Ambulatory Visit: Payer: Self-pay | Admitting: Internal Medicine

## 2013-12-14 LAB — CBC CANCER CENTER
BANDS NEUTROPHIL: 1 %
HCT: 28.4 % — AB (ref 40.0–52.0)
HGB: 8.7 g/dL — ABNORMAL LOW (ref 13.0–18.0)
Lymphocytes: 95 %
MCH: 32.7 pg (ref 26.0–34.0)
MCHC: 30.5 g/dL — ABNORMAL LOW (ref 32.0–36.0)
MCV: 107 fL — AB (ref 80–100)
Monocytes: 2 %
Platelet: 102 x10 3/mm — ABNORMAL LOW (ref 150–440)
RBC: 2.65 10*6/uL — AB (ref 4.40–5.90)
RDW: 22.6 % — ABNORMAL HIGH (ref 11.5–14.5)
Segmented Neutrophils: 2 %
WBC: 212 x10 3/mm — ABNORMAL HIGH (ref 3.8–10.6)

## 2013-12-14 LAB — BASIC METABOLIC PANEL
Anion Gap: 11 (ref 7–16)
BUN: 18 mg/dL (ref 7–18)
CHLORIDE: 104 mmol/L (ref 98–107)
Calcium, Total: 8.8 mg/dL (ref 8.5–10.1)
Co2: 25 mmol/L (ref 21–32)
Creatinine: 1.51 mg/dL — ABNORMAL HIGH (ref 0.60–1.30)
EGFR (African American): 56 — ABNORMAL LOW
EGFR (Non-African Amer.): 48 — ABNORMAL LOW
GLUCOSE: 95 mg/dL (ref 65–99)
Osmolality: 281 (ref 275–301)
Potassium: 5 mmol/L (ref 3.5–5.1)
SODIUM: 140 mmol/L (ref 136–145)

## 2013-12-14 LAB — PHOSPHORUS: PHOSPHORUS: 3.8 mg/dL (ref 2.5–4.9)

## 2013-12-14 LAB — MAGNESIUM: MAGNESIUM: 1.6 mg/dL — AB

## 2013-12-14 LAB — URIC ACID: URIC ACID: 8.9 mg/dL — AB (ref 3.5–7.2)

## 2013-12-26 DIAGNOSIS — I10 Essential (primary) hypertension: Secondary | ICD-10-CM | POA: Insufficient documentation

## 2013-12-26 DIAGNOSIS — E119 Type 2 diabetes mellitus without complications: Secondary | ICD-10-CM | POA: Insufficient documentation

## 2013-12-26 DIAGNOSIS — C9110 Chronic lymphocytic leukemia of B-cell type not having achieved remission: Secondary | ICD-10-CM | POA: Insufficient documentation

## 2013-12-28 ENCOUNTER — Ambulatory Visit: Payer: Self-pay | Admitting: Internal Medicine

## 2014-01-25 LAB — BASIC METABOLIC PANEL
Anion Gap: 10 (ref 7–16)
BUN: 17 mg/dL (ref 7–18)
CHLORIDE: 107 mmol/L (ref 98–107)
CREATININE: 1.35 mg/dL — AB (ref 0.60–1.30)
Calcium, Total: 9 mg/dL (ref 8.5–10.1)
Co2: 26 mmol/L (ref 21–32)
EGFR (African American): >60
GFR CALC NON AF AMER: 57 — AB
GLUCOSE: 98 mg/dL (ref 65–99)
Osmolality: 286 (ref 275–301)
Potassium: 4.5 mmol/L (ref 3.5–5.1)
SODIUM: 143 mmol/L (ref 136–145)

## 2014-01-25 LAB — CBC CANCER CENTER
Eosinophil: 1 %
HCT: 26.6 % — AB (ref 40.0–52.0)
HGB: 7.8 g/dL — AB (ref 13.0–18.0)
Lymphocytes: 94 %
MCH: 32.2 pg (ref 26.0–34.0)
MCHC: 29.3 g/dL — ABNORMAL LOW (ref 32.0–36.0)
MCV: 110 fL — ABNORMAL HIGH (ref 80–100)
Monocytes: 1 %
NRBC/100 WBC: 1 /100
Platelet: 128 x10 3/mm — ABNORMAL LOW (ref 150–440)
RBC: 2.42 10*6/uL — AB (ref 4.40–5.90)
RDW: 20.8 % — AB (ref 11.5–14.5)
SEGMENTED NEUTROPHILS: 4 %
WBC: 184.1 x10 3/mm — ABNORMAL HIGH (ref 3.8–10.6)

## 2014-01-25 LAB — URIC ACID: Uric Acid: 7.1 mg/dL (ref 3.5–7.2)

## 2014-01-25 LAB — HEPATIC FUNCTION PANEL A (ARMC)
ALBUMIN: 3.8 g/dL (ref 3.4–5.0)
ALT: 25 U/L
AST: 12 U/L — AB (ref 15–37)
Alkaline Phosphatase: 84 U/L
BILIRUBIN TOTAL: 0.6 mg/dL (ref 0.2–1.0)
Bilirubin, Direct: 0.2 mg/dL (ref 0.00–0.20)
Total Protein: 6.7 g/dL (ref 6.4–8.2)

## 2014-01-25 LAB — MAGNESIUM: MAGNESIUM: 1.4 mg/dL — AB

## 2014-01-25 LAB — PHOSPHORUS: Phosphorus: 3.9 mg/dL (ref 2.5–4.9)

## 2014-01-27 ENCOUNTER — Ambulatory Visit: Payer: Self-pay | Admitting: Internal Medicine

## 2014-02-15 LAB — CBC CANCER CENTER
BASOS ABS: 0.1 x10 3/mm (ref 0.0–0.1)
BASOS PCT: 0.1 %
EOS PCT: 0.3 %
Eosinophil #: 0.3 x10 3/mm (ref 0.0–0.7)
HCT: 31.6 % — ABNORMAL LOW (ref 40.0–52.0)
HGB: 8.9 g/dL — AB (ref 13.0–18.0)
LYMPHS PCT: 92.4 %
Lymphocyte #: 110.7 x10 3/mm — ABNORMAL HIGH (ref 1.0–3.6)
MCH: 30.5 pg (ref 26.0–34.0)
MCHC: 28 g/dL — AB (ref 32.0–36.0)
MCV: 109 fL — ABNORMAL HIGH (ref 80–100)
Monocyte #: 1.4 x10 3/mm — ABNORMAL HIGH (ref 0.2–1.0)
Monocyte %: 1.2 %
Neutrophil #: 7.2 x10 3/mm — ABNORMAL HIGH (ref 1.4–6.5)
Neutrophil %: 6 %
Platelet: 130 x10 3/mm — ABNORMAL LOW (ref 150–440)
RBC: 2.9 10*6/uL — AB (ref 4.40–5.90)
RDW: 19.6 % — AB (ref 11.5–14.5)
WBC: 119.7 x10 3/mm — AB (ref 3.8–10.6)

## 2014-02-15 LAB — BASIC METABOLIC PANEL
Anion Gap: 7 (ref 7–16)
BUN: 13 mg/dL (ref 7–18)
CALCIUM: 8.4 mg/dL — AB (ref 8.5–10.1)
CHLORIDE: 110 mmol/L — AB (ref 98–107)
CO2: 27 mmol/L (ref 21–32)
Creatinine: 1.23 mg/dL (ref 0.60–1.30)
EGFR (African American): >60
EGFR (Non-African Amer.): >60
GLUCOSE: 113 mg/dL — AB (ref 65–99)
Osmolality: 288 (ref 275–301)
POTASSIUM: 4.2 mmol/L (ref 3.5–5.1)
Sodium: 144 mmol/L (ref 136–145)

## 2014-02-15 LAB — URIC ACID: URIC ACID: 7.4 mg/dL — AB (ref 3.5–7.2)

## 2014-02-15 LAB — MAGNESIUM: MAGNESIUM: 1.6 mg/dL — AB

## 2014-02-15 LAB — PHOSPHORUS: PHOSPHORUS: 3.7 mg/dL (ref 2.5–4.9)

## 2014-02-27 ENCOUNTER — Ambulatory Visit: Payer: Self-pay | Admitting: Internal Medicine

## 2014-03-08 LAB — CBC CANCER CENTER
BASOS PCT: 0.2 %
Basophil #: 0.2 x10 3/mm — ABNORMAL HIGH (ref 0.0–0.1)
EOS PCT: 0.3 %
Eosinophil #: 0.2 x10 3/mm (ref 0.0–0.7)
HCT: 32.6 % — AB (ref 40.0–52.0)
HGB: 9.8 g/dL — ABNORMAL LOW (ref 13.0–18.0)
LYMPHS PCT: 89.8 %
Lymphocyte #: 73.7 x10 3/mm — ABNORMAL HIGH (ref 1.0–3.6)
MCH: 31.6 pg (ref 26.0–34.0)
MCHC: 30.2 g/dL — ABNORMAL LOW (ref 32.0–36.0)
MCV: 104 fL — AB (ref 80–100)
MONOS PCT: 2.7 %
Monocyte #: 2.2 x10 3/mm — ABNORMAL HIGH (ref 0.2–1.0)
NEUTROS ABS: 5.8 x10 3/mm (ref 1.4–6.5)
NEUTROS PCT: 7 %
PLATELETS: 113 x10 3/mm — AB (ref 150–440)
RBC: 3.12 10*6/uL — AB (ref 4.40–5.90)
RDW: 17.8 % — AB (ref 11.5–14.5)
WBC: 82.1 x10 3/mm — AB (ref 3.8–10.6)

## 2014-03-08 LAB — BASIC METABOLIC PANEL
ANION GAP: 6 — AB (ref 7–16)
BUN: 21 mg/dL — ABNORMAL HIGH (ref 7–18)
CALCIUM: 8.5 mg/dL (ref 8.5–10.1)
CREATININE: 1.31 mg/dL — AB (ref 0.60–1.30)
Chloride: 113 mmol/L — ABNORMAL HIGH (ref 98–107)
Co2: 25 mmol/L (ref 21–32)
EGFR (African American): >60
EGFR (Non-African Amer.): 58 — ABNORMAL LOW
GLUCOSE: 108 mg/dL — AB (ref 65–99)
Osmolality: 290 (ref 275–301)
Potassium: 4.6 mmol/L (ref 3.5–5.1)
Sodium: 144 mmol/L (ref 136–145)

## 2014-03-08 LAB — URIC ACID: Uric Acid: 6.2 mg/dL (ref 3.5–7.2)

## 2014-03-08 LAB — PHOSPHORUS: PHOSPHORUS: 4 mg/dL (ref 2.5–4.9)

## 2014-03-08 LAB — MAGNESIUM: Magnesium: 1.9 mg/dL

## 2014-03-29 ENCOUNTER — Ambulatory Visit: Payer: Self-pay | Admitting: Internal Medicine

## 2014-03-29 LAB — MAGNESIUM: MAGNESIUM: 2.1 mg/dL

## 2014-03-29 LAB — HEPATIC FUNCTION PANEL A (ARMC)
ALT: 16 U/L
AST: 8 U/L — AB (ref 15–37)
Albumin: 3.6 g/dL (ref 3.4–5.0)
Alkaline Phosphatase: 93 U/L
BILIRUBIN DIRECT: 0.2 mg/dL (ref 0.0–0.2)
BILIRUBIN TOTAL: 0.9 mg/dL (ref 0.2–1.0)
TOTAL PROTEIN: 7 g/dL (ref 6.4–8.2)

## 2014-03-29 LAB — CBC CANCER CENTER
Basophil #: 0 x10 3/mm (ref 0.0–0.1)
Basophil %: 0.1 %
Eosinophil #: 0 x10 3/mm (ref 0.0–0.7)
Eosinophil %: 0 %
HCT: 28.7 % — AB (ref 40.0–52.0)
HGB: 8.7 g/dL — AB (ref 13.0–18.0)
Lymphocyte #: 37.5 x10 3/mm — ABNORMAL HIGH (ref 1.0–3.6)
Lymphocyte %: 64.3 %
MCH: 31 pg (ref 26.0–34.0)
MCHC: 30.4 g/dL — ABNORMAL LOW (ref 32.0–36.0)
MCV: 102 fL — AB (ref 80–100)
Monocyte #: 2.9 x10 3/mm — ABNORMAL HIGH (ref 0.2–1.0)
Monocyte %: 5 %
Neutrophil #: 17.8 x10 3/mm — ABNORMAL HIGH (ref 1.4–6.5)
Neutrophil %: 30.6 %
Platelet: 110 x10 3/mm — ABNORMAL LOW (ref 150–440)
RBC: 2.82 10*6/uL — AB (ref 4.40–5.90)
RDW: 18.3 % — ABNORMAL HIGH (ref 11.5–14.5)
WBC: 58.4 x10 3/mm — AB (ref 3.8–10.6)

## 2014-03-29 LAB — BASIC METABOLIC PANEL
Anion Gap: 5 — ABNORMAL LOW (ref 7–16)
BUN: 14 mg/dL (ref 7–18)
CALCIUM: 8.5 mg/dL (ref 8.5–10.1)
CO2: 28 mmol/L (ref 21–32)
Chloride: 108 mmol/L — ABNORMAL HIGH (ref 98–107)
Creatinine: 1.32 mg/dL — ABNORMAL HIGH (ref 0.60–1.30)
GFR CALC NON AF AMER: 58 — AB
Glucose: 126 mg/dL — ABNORMAL HIGH (ref 65–99)
Osmolality: 283 (ref 275–301)
Potassium: 4.2 mmol/L (ref 3.5–5.1)
SODIUM: 141 mmol/L (ref 136–145)

## 2014-03-29 LAB — URIC ACID: Uric Acid: 6.1 mg/dL (ref 3.5–7.2)

## 2014-03-29 LAB — PHOSPHORUS: PHOSPHORUS: 3.5 mg/dL (ref 2.5–4.9)

## 2014-04-12 LAB — CBC CANCER CENTER
Basophil #: 0.4 x10 3/mm — ABNORMAL HIGH (ref 0.0–0.1)
Basophil %: 0.7 %
EOS ABS: 0.2 x10 3/mm (ref 0.0–0.7)
Eosinophil %: 0.4 %
HCT: 31.2 % — ABNORMAL LOW (ref 40.0–52.0)
HGB: 9.4 g/dL — ABNORMAL LOW (ref 13.0–18.0)
LYMPHS ABS: 41 x10 3/mm — AB (ref 1.0–3.6)
Lymphocyte %: 76.4 %
MCH: 29.9 pg (ref 26.0–34.0)
MCHC: 30 g/dL — ABNORMAL LOW (ref 32.0–36.0)
MCV: 100 fL (ref 80–100)
MONO ABS: 1.9 x10 3/mm — AB (ref 0.2–1.0)
Monocyte %: 3.6 %
NEUTROS PCT: 18.9 %
Neutrophil #: 10.2 x10 3/mm — ABNORMAL HIGH (ref 1.4–6.5)
Platelet: 133 x10 3/mm — ABNORMAL LOW (ref 150–440)
RBC: 3.13 10*6/uL — ABNORMAL LOW (ref 4.40–5.90)
RDW: 18.6 % — ABNORMAL HIGH (ref 11.5–14.5)
WBC: 53.7 x10 3/mm — AB (ref 3.8–10.6)

## 2014-04-12 LAB — CREATININE, SERUM
CREATININE: 1.2 mg/dL (ref 0.60–1.30)
EGFR (African American): >60
EGFR (Non-African Amer.): >60

## 2014-04-12 LAB — PHOSPHORUS: PHOSPHORUS: 3.2 mg/dL (ref 2.5–4.9)

## 2014-04-12 LAB — MAGNESIUM: Magnesium: 2.1 mg/dL

## 2014-04-29 ENCOUNTER — Ambulatory Visit: Payer: Self-pay | Admitting: Internal Medicine

## 2014-05-03 LAB — PHOSPHORUS: Phosphorus: 3.4 mg/dL (ref 2.5–4.9)

## 2014-05-03 LAB — HEPATIC FUNCTION PANEL A (ARMC)
ALBUMIN: 4.1 g/dL (ref 3.4–5.0)
Alkaline Phosphatase: 94 U/L
BILIRUBIN DIRECT: 0.1 mg/dL (ref 0.0–0.2)
Bilirubin,Total: 0.5 mg/dL (ref 0.2–1.0)
SGOT(AST): 15 U/L (ref 15–37)
SGPT (ALT): 26 U/L
TOTAL PROTEIN: 7.5 g/dL (ref 6.4–8.2)

## 2014-05-03 LAB — CBC CANCER CENTER
BASOS ABS: 0 x10 3/mm (ref 0.0–0.1)
Basophil %: 0.1 %
EOS ABS: 0.1 x10 3/mm (ref 0.0–0.7)
Eosinophil %: 0.3 %
HCT: 35.1 % — AB (ref 40.0–52.0)
HGB: 10.6 g/dL — ABNORMAL LOW (ref 13.0–18.0)
LYMPHS ABS: 30.6 x10 3/mm — AB (ref 1.0–3.6)
Lymphocyte %: 78.4 %
MCH: 29.8 pg (ref 26.0–34.0)
MCHC: 30.2 g/dL — AB (ref 32.0–36.0)
MCV: 99 fL (ref 80–100)
MONO ABS: 1.8 x10 3/mm — AB (ref 0.2–1.0)
MONOS PCT: 4.7 %
NEUTROS ABS: 6.4 x10 3/mm (ref 1.4–6.5)
Neutrophil %: 16.5 %
Platelet: 136 x10 3/mm — ABNORMAL LOW (ref 150–440)
RBC: 3.56 10*6/uL — ABNORMAL LOW (ref 4.40–5.90)
RDW: 19.1 % — AB (ref 11.5–14.5)
WBC: 39 x10 3/mm — ABNORMAL HIGH (ref 3.8–10.6)

## 2014-05-03 LAB — MAGNESIUM: Magnesium: 1.8 mg/dL

## 2014-05-03 LAB — BASIC METABOLIC PANEL
ANION GAP: 8 (ref 7–16)
BUN: 20 mg/dL — AB (ref 7–18)
CALCIUM: 8.6 mg/dL (ref 8.5–10.1)
Chloride: 110 mmol/L — ABNORMAL HIGH (ref 98–107)
Co2: 24 mmol/L (ref 21–32)
Creatinine: 1.35 mg/dL — ABNORMAL HIGH (ref 0.60–1.30)
GFR CALC NON AF AMER: 56 — AB
Glucose: 98 mg/dL (ref 65–99)
Osmolality: 286 (ref 275–301)
Potassium: 4.7 mmol/L (ref 3.5–5.1)
Sodium: 142 mmol/L (ref 136–145)

## 2014-05-12 ENCOUNTER — Ambulatory Visit: Payer: Self-pay | Admitting: Unknown Physician Specialty

## 2014-05-18 ENCOUNTER — Ambulatory Visit: Payer: Self-pay | Admitting: Unknown Physician Specialty

## 2014-05-24 LAB — PHOSPHORUS: Phosphorus: 4.1 mg/dL (ref 2.5–4.9)

## 2014-05-24 LAB — CBC CANCER CENTER
BASOS ABS: 0.2 x10 3/mm — AB (ref 0.0–0.1)
Basophil %: 0.6 %
Eosinophil #: 0.2 x10 3/mm (ref 0.0–0.7)
Eosinophil %: 0.5 %
HCT: 30.4 % — AB (ref 40.0–52.0)
HGB: 9.3 g/dL — ABNORMAL LOW (ref 13.0–18.0)
Lymphocyte #: 22.9 x10 3/mm — ABNORMAL HIGH (ref 1.0–3.6)
Lymphocyte %: 67.6 %
MCH: 29.8 pg (ref 26.0–34.0)
MCHC: 30.8 g/dL — AB (ref 32.0–36.0)
MCV: 97 fL (ref 80–100)
Monocyte #: 1.6 x10 3/mm — ABNORMAL HIGH (ref 0.2–1.0)
Monocyte %: 4.6 %
NEUTROS ABS: 9 x10 3/mm — AB (ref 1.4–6.5)
Neutrophil %: 26.7 %
Platelet: 154 x10 3/mm (ref 150–440)
RBC: 3.14 10*6/uL — ABNORMAL LOW (ref 4.40–5.90)
RDW: 18.3 % — AB (ref 11.5–14.5)
WBC: 33.8 x10 3/mm — ABNORMAL HIGH (ref 3.8–10.6)

## 2014-05-24 LAB — MAGNESIUM: Magnesium: 1.9 mg/dL

## 2014-05-24 LAB — CREATININE, SERUM
Creatinine: 1.38 mg/dL — ABNORMAL HIGH (ref 0.60–1.30)
EGFR (African American): >60
EGFR (Non-African Amer.): 55 — ABNORMAL LOW

## 2014-05-30 ENCOUNTER — Ambulatory Visit: Payer: Self-pay | Admitting: Internal Medicine

## 2014-06-28 ENCOUNTER — Ambulatory Visit
Admit: 2014-06-28 | Disposition: A | Discharge status: Home / Self Care | Payer: Self-pay | Attending: Internal Medicine | Admitting: Internal Medicine

## 2014-07-29 ENCOUNTER — Ambulatory Visit
Admit: 2014-07-29 | Disposition: A | Discharge status: Home / Self Care | Payer: Self-pay | Attending: Internal Medicine | Admitting: Internal Medicine

## 2014-08-09 LAB — CBC CANCER CENTER
BASOS ABS: 0.1 x10 3/mm (ref 0.0–0.1)
Basophil %: 0.9 %
EOS ABS: 0.1 x10 3/mm (ref 0.0–0.7)
Eosinophil %: 1.1 %
HCT: 35.6 % — ABNORMAL LOW (ref 40.0–52.0)
HGB: 11.4 g/dL — ABNORMAL LOW (ref 13.0–18.0)
Lymphocyte #: 6.5 x10 3/mm — ABNORMAL HIGH (ref 1.0–3.6)
Lymphocyte %: 53.3 %
MCH: 31.8 pg (ref 26.0–34.0)
MCHC: 31.9 g/dL — AB (ref 32.0–36.0)
MCV: 100 fL (ref 80–100)
MONOS PCT: 9.2 %
Monocyte #: 1.1 x10 3/mm — ABNORMAL HIGH (ref 0.2–1.0)
NEUTROS ABS: 4.3 x10 3/mm (ref 1.4–6.5)
Neutrophil %: 35.5 %
Platelet: 95 x10 3/mm — ABNORMAL LOW (ref 150–440)
RBC: 3.57 10*6/uL — ABNORMAL LOW (ref 4.40–5.90)
RDW: 18.5 % — ABNORMAL HIGH (ref 11.5–14.5)
WBC: 12.2 x10 3/mm — ABNORMAL HIGH (ref 3.8–10.6)

## 2014-08-09 LAB — CREATININE, SERUM
Creatinine: 1.22 mg/dL
EGFR (African American): >60
EGFR (Non-African Amer.): >60

## 2014-08-09 LAB — MAGNESIUM: Magnesium: 2 mg/dL

## 2014-08-09 LAB — PHOSPHORUS: PHOSPHORUS: 3.8 mg/dL

## 2014-08-20 NOTE — Consult Note (Signed)
PATIENT NAME:  James [name], James [name] MR#:  [id number] DATE OF BIRTH:  October 28, 1950  DATE OF CONSULTATION:  11/17/2013  REFERRING PHYSICIAN:  [name] CONSULTING PHYSICIAN:  Consuelo Suthers R. Ma Hillock, MD  REASON FOR CONSULTATION:  Chronic lymphocytic leukemia with markedly leukocytosis, WBC count greater than 200,000.   HISTORY OF PRESENT ILLNESS:  The patient is a James [name] with past medical history significant for stage IV B-cell chronic lymphocytic leukemia initially diagnosed July 2006, he has received four cycles of Treanda/Rituxan chemotherapy completed September 2011 and has been on observation since then.  Most recently seen at [facility] in March at which time he was having gradually progressive leukocytosis, but otherwise was mostly asymptomatic and observation was continued.  Past medical history otherwise remarkable for hypertension, intermittent hematuria in the past followed by urology, CLL, cholecystectomy and chronic smoking.  The patient currently admitted to the hospital with diarrhea for 7 to 8 days, dehydration and renal insufficiency.  He is getting IV hydration, creatinine is better today.  He remains afebrile.  C. difficile in stool is negative.  States that he continues to have some cough along with generalized weakness.  Diarrhea is better today.   PAST MEDICAL HISTORY AND PAST SURGICAL HISTORY:  As in HPI above.   FAMILY HISTORY:  Noncontributory.   SOCIAL HISTORY:  Chronic smoker 1 pack per day for over 30 years.  Takes a small amount of alcohol on a regular basis.  Denies recreational drug usage.  Physically active.   ALLERGIES:  No known drug allergies.   MEDICATIONS:  Aspirin 81 mg daily, HCTZ 25 mg daily, lisinopril 10 mg daily, metformin 1000 mg twice daily.   REVIEW OF SYSTEMS: CONSTITUTIONAL:  As in HPI.  No fever or chills.  HEENT:  Denies any headaches, dizziness, epistaxis, ear or jaw pain.  No sinus symptoms.  CARDIAC:  No angina, palpitation, orthopnea, or  PND.  LUNGS:  Has cough with mild dyspnea on exertion.  No hemoptysis or chest pain.  GASTROINTESTINAL:  Has abdominal pain, diarrhea ongoing, but better today.  No bright red blood in stools or melena.  GENITOURINARY:  No dysuria or hematuria currently.  SKIN:  No new rashes or pruritus.  HEMATOLOGIC:  Denies any obvious bleeding issues.  EXTREMITIES:  No new pain or swelling.  MUSCULOSKELETAL:  No new bone pains.  Chronic arthritis unchanged.  NEUROLOGIC:  No new focal weakness, seizures or loss of consciousness.  ENDOCRINE:  No polyuria or polydipsia.  Appetite steady.   PHYSICAL EXAMINATION: GENERAL:  The patient is a moderately built and well-nourished individual, resting in bed, alert and oriented and converses appropriately.  No icterus.  Pallor present.  VITAL SIGNS:  97.9, James, 18, 109/67, 97% on room air.  HEENT:  Normocephalic, atraumatic, extraocular movements intact.  Sclerae anicteric.  No oral thrush.  NECK:  Does not show obvious lymphadenopathy.  CARDIOVASCULAR:  S1, S2, regular rate and rhythm.  LUNGS:  Bilateral diminished breath sounds overall, no rhonchi or crepitation.  ABDOMEN:  Soft, nontender.  No guarding or rigidity.  No hepatosplenomegaly or masses palpable on exam.  EXTREMITIES:  Trace edema.  No cyanosis.  SKIN:  No generalized rashes or major bruising.  NEUROLOGIC:  Cranial nerves intact, grossly nonfocal.  MUSCULOSKELETAL:  No obvious joint deformity or swelling.   LABORATORY, DIAGNOSTIC, AND RADIOLOGICAL DATA:  Stool C. difficile negative.  Creatinine today better at 1.79, BUN 45, calcium 8.0.  On July 21st, WBC 239.6, 7% neutrophils, 1% bands, 35% lymphocytes, 13% variant  lymphocytes and 41% other cells with smear reported compatible with the patient's CLL history, smudge cells present.  Hemoglobin 8.3, MCV 114, platelet count 110.  Lipase 522.   IMPRESSION AND RECOMMENDATION:  A 64 year old [name] with known history of stage IV chronic lymphocytic  leukemia status post Treanda/Rituxan treatment in the past, completed September 2011, and on observation since then since he has been in partial remission, currently admitted with diarrhea, dehydration, renal insufficiency and weakness.  Symptoms are slowly improving with ongoing supportive treatment.  Complete blood count otherwise shows marked leukocytosis with white blood cell increased up to 239.6, hemoglobin has dropped to less than 10 at 8.3 and platelet count is dropping at 110.  Leukocytosis is secondary to progressive chronic lymphocytic leukemia and he most likely will need to pursue treatment once he gets over ongoing acute illness and diarrhea.  Will also get repeat peripheral blood flow cytometry (immunophenotyping study) to re-evaluate chronic lymphocytic leukemia and get CT scan of the chest/abdomen/pelvis without contrast done tomorrow to evaluate for any bulky lymphadenopathy.  Once he is better, we will meet up with the patient and discuss treatment options.  Serum quantitative immunoglobulins done in March were within normal limits otherwise.  The patient explained above details, he is agreeable to this plan.   Thank you for the referral, please feel free to contact me if any additional questions.    ____________________________ Rhett Bannister Ma Hillock, MD srp:ea D: 11/17/2013 16:23:52 ET T: 11/17/2013 17:28:16 ET JOB#: [id number]  cc: Trevor Iha R. Ma Hillock, MD, <Dictator> Alveta Heimlich MD ELECTRONICALLY SIGNED 11/17/2013 21:40

## 2014-08-20 NOTE — H&P (Signed)
PATIENT NAME:  James [name], James [name] MR#:  [id number] DATE OF BIRTH:  04/18/1951  DATE OF ADMISSION:  11/16/2013  PRIMARY CARE PHYSICIAN:  [name].  PRIMARY ONCOLOGIST:  Dr. Ma Hillock.  CHIEF COMPLAINT:  Abdominal pain and weakness.  HISTORY OF PRESENT ILLNESS:  James [name] is a pleasant 63 year old African American gentleman with history of CLL, which has been in remission for the last 3-4 years, comes to the Emergency Room after he started having diarrhea last week, which lasted about 7 days.  The patient had 4 bowel movements again today, which is non-bloody diarrhea.  He comes in with significant abdominal generalized pain, which he rates 6/10.  Denies any bloody stools.  Denies any nausea or vomiting; however, he has poor p.o. intake.  He was found to be in acute renal failure with significant dehydration with creatinine of 2.2.  His baseline creatinine is 1.3.  His potassium is 5.2.  He is being admitted for further evaluation and management.  Given his elevated creatinine, CT scan of the abdomen has not been able to be done.  KUB is still pending.  Patient is being admitted for further evaluation and management. Denies any recent travel or recent antibiotic use.    PAST MEDICAL HISTORY: 1.  CLL.  Completed treatment in September 2011.  2.  Hypertension. 3.  Hematuria.  Follows with urology.   PAST SURGICAL HISTORY: Cholecystectomy in 1983.  SOCIAL HISTORY:  He is a smoker since 1970, one pack of cigarettes a day.  Denies any alcohol or drug use.  Smoking cessation counseled, about 4 minutes spent.  Patient is not too keen on smoking cessation.  MEDICATIONS:   1.  Metformin 1000 mg b.i.d. 2.  Lisinopril 10 mg daily. 3.  Hydrochlorothiazide 25 mg daily. 4.  Aspirin 81 mg daily. 5.  Cialis as needed.  FAMILY HISTORY:  Positive for hypertension.  REVIEW OF SYSTEMS: CONSTITUTIONAL:  No fever.  Possible fatigue and weakness.  No weight loss or weight gain. EYES:  No blurred or double vision,  glaucoma, or cataract. EAR, NOSE, AND THROAT:  No tinnitus, ear pain, hearing loss or post-nasal drip. RESPIRATORY:  No cough or wheeze, hemoptysis or dyspnea.  CARDIOVASCULAR:  No chest pain, orthopnea, edema, or arrhythmia. GASTROINTESTINAL:  Positive for diarrhea and abdominal pain.  No rectal bleed or constipation. GENITOURINARY:  No dysuria, hematuria, or frequency. ENDOCRINE:  No polyuria, nocturia, or thyroid problems. HEMATOLOGY:  No anemia or easy bruising.  Positive for CLL.  SKIN:  No acne, rash, or lesion. MUSCULOSKELETAL:  No arthritis, cramps, swelling, or gout.  NEUROLOGIC:  No CVA, TIA, ataxia, or dementia. PSYCHIATRIC:  No anxiety or depression.    All other systems reviewed and negative.    PHYSICAL EXAMINATION: GENERAL:   Patient is awake, alert, oriented x 3, mild to moderate distress due to abdominal pain. VITAL SIGNS:  Temperature is 98, pulse is 87, blood pressure is 103/76, saturations 100% on room air. HEENT:  Atraumatic, normocephalic.  Pupils are equal, round, and reactive to light and accommodation.  EOM intact.  Oral mucosa is dry. NECK:  Supple.  No JVD.  No carotid bruit. RESPIRATORY:  Clear to auscultation bilaterally.  No rales, rhonchi, respiratory distress CARDIOVASCULAR:  Both the heart sounds are normal.  Rate and rhythm regular.  PMI not lateralized.  Chest nontender.  Good pedal pulses.  Good femoral pulses.  No lower extremity edema.   ABDOMEN: Soft.  Benign.  There is some diffuse tenderness, more in the epigastric  area.  Good hyperactive bowel sounds.   NEUROLOGIC: Grossly normal cranial nerves II-XII.  No motor or sensory deficits.  PSYCHIATRIC:  The patient is awake, alert, oriented x 3.  SKIN:  Warm and dry.    LABORATORY DATA:  Troponin is less than 0.02, white count is 239, hemoglobin and hematocrit is 8.3 and 28.3.  Glucose is 140, BUN is 52, creatinine 2.28, sodium 135, potassium is 5.2, chloride 109, bicarbonate is 17.   Patient's  baseline creatinine is 1.3.    KUB is still pending.  ASSESSMENT:  64 year old James [name] with history of B-cell chronic lymphocytic leukemia who finished chemotherapy September 2011.  History of type 2 diabetes and hypertension.  Comes in with abdominal pain.  He has been having diarrhea for the last 7 days.  He is being admitted with:  1.  Abdominal pain in the setting of acute diarrhea.  We will keep patient N.P.O., give p.r.n. antiemetics, and pain medication.  IV fluids for hydration.  Follow up metabolic panel.  Follow up x-ray KUB.   We will also do a CT of the abdomen since the patient creatinine is elevated.  We will consider surgical consultation if needed.    2.  Acute diarrhea.  Patient has been having diarrhea for the last 7 days.  Denies any recent antibiotic or recent travel; however, I will send stool for Clostridium difficile and stool comprehensive culture.  His white count is elevated secondary to his chronic lymphocytic leukemia.  If stool cultures are negative, consider giving Lomotil as needed.  3.  B-cell chronic lymphocytic leukemia, chronic, known with elevated white count, likely from stress reaction due to recent illness.  Emergency Room physician spoke with Dr. Ma Hillock and recommends symptomatic management.  4.  Anemia of chronic disease.  Hemoglobin and hematocrit is stable.  No active bleeding noted.  5.  Mild hyperkalemia, likely due to dehydration and renal failure.  We will hydrate patient and monitor potassium at this time.   6.  Deep venous thrombosis prophylaxis.  Subcutaneous heparin.  7.  Type 2 diabetes.  Since patient is N.P.O. at this time, we will continue sliding scale insulin for now.  Resume home medication once patient is able to tolerate p.o. diet.   8.  Relative hypotension with history of hypertension.  Hold off on hydrochlorothiazide and lisinopril.    9.   Plan was discussed with patient and patient's sister.  10.  PATIENT IS A FULL  CODE.   TIME SPENT:  50 minutes.    ____________________________ Hart Rochester Posey Pronto, MD sap:ts D: 11/16/2013 17:29:10 ET T: 11/16/2013 18:28:38 ET JOB#: [id number]  cc: Frenchie Pribyl A. Posey Pronto, MD, <Dictator> [name], MD Sandeep R. Ma Hillock, MD  Ilda Basset MD ELECTRONICALLY SIGNED 12/08/2013 11:32

## 2014-08-20 NOTE — Consult Note (Signed)
ONCOLOGY followup note - feels fatigued. Diarrhea improving.denies bleeding issues. Eating better.A, O x 3, NAD.          vitals - afebrile, stable          lungs - b/l good air entry          abd - soft, NTWBC 111.2, Hb 5.8, platelets 58K, Cr 1.45. Coombs test positive.  64 year old gentleman with known history of stage IV chronic lymphocytic leukemia status post Treanda/Rituxan treatment in the past, completed September 2011, and on observation since then since he has been in partial remission, currently admitted with diarrhea, dehydration, renal insufficiency and weakness.  Symptoms are slowly improving with ongoing supportive treatment.  Complete blood count today shows marked leukocytosis is better. 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 Will start on Prednisone, continue to monitor diabetes, agree with supportive PRBC tx. Patient explained about benefits and risks of blood transfusion, he is agreeable and signed informed consent form. Thrombocytopenia secondary to CLL and possibly CL-related ITP. Repeat peripheral blood flow cytometry (immunophenotyping study) to re-evaluate CLL is pending. CT scan of the chest/abdomen/pelvis reports bulky gen lymphadenopathy. Once he is better, will meet up with the patient and discuss treatment options.  Serum quantitative immunoglobulins done in March were within normal limits otherwise.  The patient explained above details, he is agreeable to this plan.  Electronic Signatures: Jonn Shingles (MD)  (Signed on 24-Jul-15 00:34)  Authored  Last Updated: 24-Jul-15 00:34 by Jonn Shingles (MD)

## 2014-08-20 NOTE — Op Note (Signed)
PATIENT NAME:  James [name], James [name] MR#:  [id number] DATE OF BIRTH:  [birth year]  DATE OF PROCEDURE:  08/03/2013  PREOPERATIVE DIAGNOSIS: Visually significant cataract of the left eye.   POSTOPERATIVE DIAGNOSIS: Visually significant cataract of the left eye.   OPERATIVE PROCEDURE: Cataract extraction by phacoemulsification with implant of intraocular lens to left eye.   SURGEON: Birder Robson, MD.   ANESTHESIA:  1. Managed anesthesia care.  2. Topical tetracaine drops followed by 2% Xylocaine jelly applied in the preoperative holding area.   COMPLICATIONS: None.   TECHNIQUE:  Stop and chop.   DESCRIPTION OF PROCEDURE: The patient was examined and consented in the preoperative holding area where the aforementioned topical anesthesia was applied to the left eye and then brought back to the operating room where the left eye was prepped and draped in the usual sterile ophthalmic fashion and a lid speculum was placed. A paracentesis was created with the side port blade and the anterior chamber was filled with viscoelastic. A near clear corneal incision was performed with the steel keratome. A continuous curvilinear capsulorrhexis was performed with a cystotome followed by the capsulorrhexis forceps. Hydrodissection and hydrodelineation were carried out with BSS on a blunt cannula. The lens was removed in a stop and chop technique and the remaining cortical material was removed with the irrigation-aspiration handpiece. The capsular bag was inflated with viscoelastic and the Alcon SN60WF 20.5-diopter lens, serial number [id number] was placed in the capsular bag without complication. The remaining viscoelastic was removed from the eye with the irrigation-aspiration handpiece. The wounds were hydrated. The anterior chamber was flushed with Miostat and the eye was inflated to physiologic pressure. 0.1 mL of cefuroxime concentration 10 mg/mL was placed in the anterior chamber. The wounds were found to be water  tight. The eye was dressed with Vigamox. The patient was given protective glasses to wear throughout the day and a shield with which to sleep tonight. The patient was also given drops with which to begin a drop regimen today and will follow up with me in 1 day.     ____________________________ Livingston Diones. Kylian Loh, MD wlp:aw D: 08/03/2013 12:16:00 ET T: 08/03/2013 12:44:06 ET JOB#: [id number]  cc: Verlin Duke L. Nya Monds, MD, <Dictator> Livingston Diones Earvin Blazier MD ELECTRONICALLY SIGNED 08/12/2013 10:52

## 2014-08-20 NOTE — Discharge Summary (Signed)
PATIENT NAME:  James [name], James [name] MR#:  [id number] DATE OF BIRTH:  05-Apr-1951  DATE OF ADMISSION:  11/16/2013 DATE OF DISCHARGE:  11/19/2013  DIAGNOSES AT TIME OF DISCHARGE:  1. Acute renal failure secondary to diarrhea and dehydration.  2. Chronic lymphocytic leukemia with significant leukocytosis.  3. Anemia.  4. Generalized weakness.  5. History of hypertension.   CHIEF COMPLAINT: Abdominal pain, weakness and diarrhea.   HISTORY OF PRESENT ILLNESS: James [name] is a 64 year old African American gentleman with a history of CLL who had been in remission for the last 3 to 4 days; comes to the ED after he was sent from the [facility] with diarrhea and weakness. The patient reportedly had been having significant diarrhea for about 7 days and also developed significant generalized abdominal pain, which he rated as 6 out of 10, and on admission, the patient's creatinine was 2.2, and his baseline creatinine was only 1.3 and noted to have an elevated potassium of 5.2. The patient's WBC count was also significantly elevated and was and 239,600. Hemoglobin on admission was 8.3. This subsequently dropped to 5.8. With intravenous fluids, the patient's leukocytosis did improve to some extent to 109,900, and his renal function also improved with creatinine improving from 2.28 to 1.29. He did receive 2 units of irradiated packed red blood cells. Coomb's test was positive. He was therefore started on prednisone taper. His diet was gradually advanced, and the patient's diarrhea also improved and his metabolic acidosis also improved. He was seen in consultation by Dr. Ma Hillock, oncologist, and also underwent a CT scan of the chest, abdomen and pelvis which showed bulky generalized lymphadenopathy. The patient was advised to return to [place] to see Dr. Ma Hillock for further chemotherapy as an outpatient. He was discharged in stable condition on the following medications: Metformin 1000 mg p.o. b.i.d., lisinopril 5 mg  once a day, prednisone taper starting at 55 mg and gradually taper by 1 tablet every day until it is gone. Advised to hold his HCTZ at this time, and the patient has been advised to follow up with Dr. Ma Hillock on Tuesday, 11/23/2013. He has been advised to drink plenty of fluids and call back with any questions or concerns.   TOTAL TIME SPENT ON DISCHARGING PATIENT: 35 minutes.     ____________________________ [name], MD vh:lm D: 11/19/2013 17:59:22 ET T: 11/20/2013 03:52:04 ET JOB#: [id number]  cc: [name], MD, <Dictator> [name] MD ELECTRONICALLY SIGNED 11/22/2013 18:18

## 2014-08-22 LAB — SURGICAL PATHOLOGY

## 2014-08-28 NOTE — Op Note (Signed)
PATIENT NAME:  James [name], KEIRSEY MR#:  [id number] DATE OF BIRTH:  June 22, 1950  DATE OF PROCEDURE:  05/18/2014  PROCEDURE: Upper endoscopy with biopsy.   PREOPERATIVE INDICATION: Epigastric pain.  POSTOPERATIVE DIAGNOSIS: 1.  Gastritis of the antrum and body.  2.  Scattered petechiae in the hypopharynx.  3.  Small hiatal hernia.   MEDICATIONS: The patient was sedated per propofol with a CRNA.   DESCRIPTION OF PROCEDURE: The upper endoscope was passed under direct vision down the down the oropharynx, and the hypopharynx and laryngeal areas looked normal. The scope was passed into the stomach. The fundic pool was suctioned. Air was put in the stomach. Scope passed to the pyloric area and then into the duodenal bulb and the second portion. The second portion area was normal. The bulb had a few scattered small spots of blood in it; this was washed and removed, and there were some linear type irritation spots. There were no ulcerations, no neoplasms.   The scope was withdrawn into the stomach. Examination of antrum, body, cardia and fundus showed some mild erythematous, slightly coarse mucosal scattered in the antrum and scattered in the body. A retroflex view of the cardia and the fundus showed no other abnormalities. Biopsies were taken from the antrum and the body to check for Helicobacter pylori and for histology.   Air was removed. Scope was withdrawn. The patient tolerated the procedure well.   PLAN: Treat his diarrhea symptoms. Have him take Carafate and a PPI. Follow back up in the office in a few weeks.    ____________________________ Manya Silvas, MD rte:MT D: 05/18/2014 11:26:33 ET T: 05/18/2014 11:57:07 ET JOB#: [id number]  cc: Manya Silvas, MD, <Dictator> [name], MD Manya Silvas MD ELECTRONICALLY SIGNED 06/09/2014 14:09

## 2014-08-29 ENCOUNTER — Other Ambulatory Visit: Payer: Self-pay | Admitting: *Deleted

## 2014-08-29 DIAGNOSIS — C9110 Chronic lymphocytic leukemia of B-cell type not having achieved remission: Secondary | ICD-10-CM

## 2014-08-30 ENCOUNTER — Other Ambulatory Visit: Payer: Self-pay

## 2014-08-31 ENCOUNTER — Inpatient Hospital Stay: Payer: Medicare Other | Attending: Internal Medicine

## 2014-08-31 DIAGNOSIS — D630 Anemia in neoplastic disease: Secondary | ICD-10-CM | POA: Diagnosis not present

## 2014-08-31 DIAGNOSIS — C9110 Chronic lymphocytic leukemia of B-cell type not having achieved remission: Secondary | ICD-10-CM | POA: Diagnosis not present

## 2014-08-31 DIAGNOSIS — D696 Thrombocytopenia, unspecified: Secondary | ICD-10-CM | POA: Diagnosis not present

## 2014-08-31 LAB — BASIC METABOLIC PANEL
Anion gap: 8 (ref 5–15)
BUN: 18 mg/dL (ref 6–20)
CO2: 23 mmol/L (ref 22–32)
Calcium: 9 mg/dL (ref 8.9–10.3)
Chloride: 105 mmol/L (ref 101–111)
Creatinine, Ser: 1.12 mg/dL (ref 0.61–1.24)
GFR calc Af Amer: >60 mL/min (ref >60)
GLUCOSE: 123 mg/dL — AB (ref 65–99)
POTASSIUM: 4.9 mmol/L (ref 3.5–5.1)
Sodium: 136 mmol/L (ref 135–145)

## 2014-08-31 LAB — CBC WITH DIFFERENTIAL/PLATELET
BASOS ABS: 0.1 10*3/uL (ref 0–0.1)
EOS ABS: 0.1 10*3/uL (ref 0–0.7)
Eosinophils Relative: 1 %
HEMATOCRIT: 35.2 % — AB (ref 40.0–52.0)
HEMOGLOBIN: 11.2 g/dL — AB (ref 13.0–18.0)
Lymphocytes Relative: 51 %
Lymphs Abs: 4.7 10*3/uL — ABNORMAL HIGH (ref 1.0–3.6)
MCH: 31.8 pg (ref 26.0–34.0)
MCHC: 31.9 g/dL — AB (ref 32.0–36.0)
MCV: 99.8 fL (ref 80.0–100.0)
Monocytes Absolute: 0.9 10*3/uL (ref 0.2–1.0)
Neutro Abs: 3.3 10*3/uL (ref 1.4–6.5)
Platelets: 103 10*3/uL — ABNORMAL LOW (ref 150–440)
RBC: 3.53 MIL/uL — ABNORMAL LOW (ref 4.40–5.90)
RDW: 18.4 % — ABNORMAL HIGH (ref 11.5–14.5)
WBC: 9.1 10*3/uL (ref 3.8–10.6)

## 2014-08-31 LAB — PHOSPHORUS: Phosphorus: 3.6 mg/dL (ref 2.5–4.6)

## 2014-08-31 LAB — HEPATIC FUNCTION PANEL
ALBUMIN: 4.3 g/dL (ref 3.5–5.0)
ALT: 13 U/L — ABNORMAL LOW (ref 17–63)
AST: 13 U/L — ABNORMAL LOW (ref 15–41)
Alkaline Phosphatase: 72 U/L (ref 38–126)
BILIRUBIN DIRECT: 0.2 mg/dL (ref 0.1–0.5)
Indirect Bilirubin: 0.4 mg/dL (ref 0.3–0.9)
Total Bilirubin: 0.6 mg/dL (ref 0.3–1.2)
Total Protein: 7.2 g/dL (ref 6.5–8.1)

## 2014-08-31 LAB — MAGNESIUM: Magnesium: 1.9 mg/dL (ref 1.7–2.4)

## 2014-09-20 ENCOUNTER — Encounter (INDEPENDENT_AMBULATORY_CARE_PROVIDER_SITE_OTHER): Payer: Self-pay

## 2014-09-20 ENCOUNTER — Inpatient Hospital Stay: Payer: Medicare Other | Attending: Internal Medicine

## 2014-09-20 DIAGNOSIS — C9110 Chronic lymphocytic leukemia of B-cell type not having achieved remission: Secondary | ICD-10-CM | POA: Insufficient documentation

## 2014-09-20 LAB — PHOSPHORUS: PHOSPHORUS: 2.9 mg/dL (ref 2.5–4.6)

## 2014-09-20 LAB — CBC WITH DIFFERENTIAL/PLATELET
Basophils Absolute: 0.1 10*3/uL (ref 0–0.1)
EOS ABS: 0.1 10*3/uL (ref 0–0.7)
HCT: 36 % — ABNORMAL LOW (ref 40.0–52.0)
HEMOGLOBIN: 11.7 g/dL — AB (ref 13.0–18.0)
LYMPHS ABS: 4.3 10*3/uL — AB (ref 1.0–3.6)
Lymphocytes Relative: 46 %
MCH: 32.9 pg (ref 26.0–34.0)
MCHC: 32.6 g/dL (ref 32.0–36.0)
MCV: 101.1 fL — ABNORMAL HIGH (ref 80.0–100.0)
Monocytes Absolute: 1 10*3/uL (ref 0.2–1.0)
Monocytes Relative: 10 %
NEUTROS ABS: 4 10*3/uL (ref 1.4–6.5)
Platelets: 84 10*3/uL — ABNORMAL LOW (ref 150–440)
RBC: 3.56 MIL/uL — AB (ref 4.40–5.90)
RDW: 18.8 % — ABNORMAL HIGH (ref 11.5–14.5)
WBC: 9.5 10*3/uL (ref 3.8–10.6)

## 2014-09-20 LAB — MAGNESIUM: Magnesium: 2 mg/dL (ref 1.7–2.4)

## 2014-09-20 LAB — CREATININE, SERUM
Creatinine, Ser: 1.12 mg/dL (ref 0.61–1.24)
GFR calc Af Amer: >60 mL/min (ref >60)
GFR calc non Af Amer: >60 mL/min (ref >60)

## 2014-10-11 ENCOUNTER — Inpatient Hospital Stay: Payer: Medicare Other | Attending: Internal Medicine

## 2014-10-11 DIAGNOSIS — C9111 Chronic lymphocytic leukemia of B-cell type in remission: Secondary | ICD-10-CM | POA: Insufficient documentation

## 2014-10-11 DIAGNOSIS — C9110 Chronic lymphocytic leukemia of B-cell type not having achieved remission: Secondary | ICD-10-CM

## 2014-10-11 LAB — MAGNESIUM: Magnesium: 2 mg/dL (ref 1.7–2.4)

## 2014-10-11 LAB — CBC WITH DIFFERENTIAL/PLATELET
BASOS ABS: 0.2 10*3/uL — AB (ref 0–0.1)
BASOS PCT: 3 %
EOS ABS: 0.1 10*3/uL (ref 0–0.7)
EOS PCT: 2 %
HCT: 36 % — ABNORMAL LOW (ref 40.0–52.0)
Hemoglobin: 11.7 g/dL — ABNORMAL LOW (ref 13.0–18.0)
Lymphocytes Relative: 45 %
Lymphs Abs: 3.9 10*3/uL — ABNORMAL HIGH (ref 1.0–3.6)
MCH: 32.6 pg (ref 26.0–34.0)
MCHC: 32.5 g/dL (ref 32.0–36.0)
MCV: 100.4 fL — AB (ref 80.0–100.0)
Monocytes Absolute: 0.9 10*3/uL (ref 0.2–1.0)
Monocytes Relative: 11 %
NEUTROS PCT: 39 %
Neutro Abs: 3.3 10*3/uL (ref 1.4–6.5)
Platelets: 108 10*3/uL — ABNORMAL LOW (ref 150–440)
RBC: 3.59 MIL/uL — AB (ref 4.40–5.90)
RDW: 18.1 % — AB (ref 11.5–14.5)
WBC: 8.5 10*3/uL (ref 3.8–10.6)

## 2014-10-11 LAB — CREATININE, SERUM
Creatinine, Ser: 1.21 mg/dL (ref 0.61–1.24)
GFR calc Af Amer: >60 mL/min (ref >60)
GFR calc non Af Amer: >60 mL/min (ref >60)

## 2014-10-11 LAB — PHOSPHORUS: PHOSPHORUS: 3.1 mg/dL (ref 2.5–4.6)

## 2014-11-01 ENCOUNTER — Inpatient Hospital Stay: Payer: Medicare Other | Admitting: Internal Medicine

## 2014-11-01 ENCOUNTER — Inpatient Hospital Stay: Payer: Medicare Other | Attending: Internal Medicine

## 2014-11-01 DIAGNOSIS — Z79899 Other long term (current) drug therapy: Secondary | ICD-10-CM | POA: Insufficient documentation

## 2014-11-01 DIAGNOSIS — F17210 Nicotine dependence, cigarettes, uncomplicated: Secondary | ICD-10-CM | POA: Insufficient documentation

## 2014-11-01 DIAGNOSIS — C9110 Chronic lymphocytic leukemia of B-cell type not having achieved remission: Secondary | ICD-10-CM | POA: Insufficient documentation

## 2014-11-01 DIAGNOSIS — D630 Anemia in neoplastic disease: Secondary | ICD-10-CM | POA: Insufficient documentation

## 2014-11-01 DIAGNOSIS — L988 Other specified disorders of the skin and subcutaneous tissue: Secondary | ICD-10-CM | POA: Insufficient documentation

## 2014-11-01 DIAGNOSIS — I10 Essential (primary) hypertension: Secondary | ICD-10-CM | POA: Insufficient documentation

## 2014-11-01 DIAGNOSIS — E1165 Type 2 diabetes mellitus with hyperglycemia: Secondary | ICD-10-CM | POA: Insufficient documentation

## 2014-11-01 DIAGNOSIS — D6959 Other secondary thrombocytopenia: Secondary | ICD-10-CM | POA: Insufficient documentation

## 2014-11-01 DIAGNOSIS — R0609 Other forms of dyspnea: Secondary | ICD-10-CM | POA: Insufficient documentation

## 2014-11-01 DIAGNOSIS — R5383 Other fatigue: Secondary | ICD-10-CM | POA: Insufficient documentation

## 2014-11-14 ENCOUNTER — Other Ambulatory Visit: Payer: Self-pay | Admitting: Internal Medicine

## 2014-11-14 ENCOUNTER — Inpatient Hospital Stay (HOSPITAL_BASED_OUTPATIENT_CLINIC_OR_DEPARTMENT_OTHER): Payer: Medicare Other | Admitting: Internal Medicine

## 2014-11-14 ENCOUNTER — Inpatient Hospital Stay: Payer: Medicare Other

## 2014-11-14 VITALS — BP 142/82 | HR 76 | Temp 97.6°F | Resp 18 | Ht 68.0 in | Wt 212.5 lb

## 2014-11-14 DIAGNOSIS — R5383 Other fatigue: Secondary | ICD-10-CM | POA: Diagnosis not present

## 2014-11-14 DIAGNOSIS — L988 Other specified disorders of the skin and subcutaneous tissue: Secondary | ICD-10-CM

## 2014-11-14 DIAGNOSIS — Z79899 Other long term (current) drug therapy: Secondary | ICD-10-CM | POA: Diagnosis not present

## 2014-11-14 DIAGNOSIS — C9110 Chronic lymphocytic leukemia of B-cell type not having achieved remission: Secondary | ICD-10-CM

## 2014-11-14 DIAGNOSIS — R0609 Other forms of dyspnea: Secondary | ICD-10-CM

## 2014-11-14 DIAGNOSIS — I10 Essential (primary) hypertension: Secondary | ICD-10-CM

## 2014-11-14 DIAGNOSIS — F17210 Nicotine dependence, cigarettes, uncomplicated: Secondary | ICD-10-CM | POA: Diagnosis not present

## 2014-11-14 DIAGNOSIS — D6959 Other secondary thrombocytopenia: Secondary | ICD-10-CM | POA: Diagnosis not present

## 2014-11-14 DIAGNOSIS — D630 Anemia in neoplastic disease: Secondary | ICD-10-CM | POA: Diagnosis not present

## 2014-11-14 DIAGNOSIS — E1165 Type 2 diabetes mellitus with hyperglycemia: Secondary | ICD-10-CM | POA: Diagnosis not present

## 2014-11-14 LAB — CBC WITH DIFFERENTIAL/PLATELET
Basophils Absolute: 0 10*3/uL (ref 0–0.1)
Basophils Relative: 0 %
EOS ABS: 0.1 10*3/uL (ref 0–0.7)
Eosinophils Relative: 2 %
HEMATOCRIT: 35.5 % — AB (ref 40.0–52.0)
HEMOGLOBIN: 11.3 g/dL — AB (ref 13.0–18.0)
Lymphocytes Relative: 41 %
Lymphs Abs: 3 10*3/uL (ref 1.0–3.6)
MCH: 32.4 pg (ref 26.0–34.0)
MCHC: 31.8 g/dL — AB (ref 32.0–36.0)
MCV: 101.9 fL — ABNORMAL HIGH (ref 80.0–100.0)
MONO ABS: 0.8 10*3/uL (ref 0.2–1.0)
MONOS PCT: 10 %
NEUTROS PCT: 47 %
Neutro Abs: 3.3 10*3/uL (ref 1.4–6.5)
PLATELETS: 105 10*3/uL — AB (ref 150–440)
RBC: 3.48 MIL/uL — ABNORMAL LOW (ref 4.40–5.90)
RDW: 18.1 % — AB (ref 11.5–14.5)
WBC: 7.2 10*3/uL (ref 3.8–10.6)

## 2014-11-14 LAB — HEPATIC FUNCTION PANEL
ALBUMIN: 4.4 g/dL (ref 3.5–5.0)
ALK PHOS: 72 U/L (ref 38–126)
ALT: 21 U/L (ref 17–63)
AST: 23 U/L (ref 15–41)
Bilirubin, Direct: <0.1 mg/dL — ABNORMAL LOW (ref 0.1–0.5)
TOTAL PROTEIN: 7.1 g/dL (ref 6.5–8.1)
Total Bilirubin: 0.6 mg/dL (ref 0.3–1.2)

## 2014-11-14 LAB — PHOSPHORUS: PHOSPHORUS: 3.7 mg/dL (ref 2.5–4.6)

## 2014-11-14 LAB — BASIC METABOLIC PANEL
Anion gap: 7 (ref 5–15)
BUN: 27 mg/dL — AB (ref 6–20)
CHLORIDE: 107 mmol/L (ref 101–111)
CO2: 25 mmol/L (ref 22–32)
Calcium: 8.4 mg/dL — ABNORMAL LOW (ref 8.9–10.3)
Creatinine, Ser: 1.59 mg/dL — ABNORMAL HIGH (ref 0.61–1.24)
GFR calc Af Amer: 51 mL/min — ABNORMAL LOW (ref >60)
GFR, EST NON AFRICAN AMERICAN: 44 mL/min — AB (ref >60)
GLUCOSE: 155 mg/dL — AB (ref 65–99)
POTASSIUM: 5 mmol/L (ref 3.5–5.1)
Sodium: 139 mmol/L (ref 135–145)

## 2014-11-14 LAB — URIC ACID: Uric Acid, Serum: 9 mg/dL — ABNORMAL HIGH (ref 4.4–7.6)

## 2014-11-14 LAB — MAGNESIUM: Magnesium: 1.8 mg/dL (ref 1.7–2.4)

## 2014-11-16 NOTE — Progress Notes (Signed)
[place]  [phone] [phone]     ID: Tomasa Blase OB: January 03, 1951  MR#: [id number]  KWI#:[id number]  Patient Care Team: [name], MD as PCP - General (Internal Medicine)  CHIEF COMPLAINT/DIAGNOSIS:  B-cell chronic lymphocytic leukemia diagnosed in July 2006 - stage 30 Aug 2009 - WBC 57,600, platelets 99,000, hemoglobin 12.5.  CT scan showed extensive extensive adenopathy in the supraclavicular, axillary, mediastinal, retroperitoneal, mesentery, peripancreatic and splenic hilar regions.  May 2011 - ZAP-70 is positive May 2011 - bone marrow biopsy showed hypercellular marrow (80%) with diffuse involvement by CLL/SLL.  Flow study showed monoclonal B-cell population with CD38 expression in 60% of clonal population.  Cytogenetics showed deletion long arm of chromosome 11 (CLL related clone).  FISH panel showed 50% of nucleated positive for ATM gene deletion (deletion of 11 q., associated with less favorable prognosis).  July 2006 -  Peripheral blood immunophenotyping shows monoclonal B cell kappa positive consistent with CLL/SLL.  ZAP - 70 level intermittent positive.  Patient got 4 cycles of Treanda and Rituxan (completed early Sept 2011).  July 2015 - had disease progression.  Started Ibrutinib in early August 2015.     HISTORY OF PRESENT ILLNESS:  Patient returns for continued oncology evaluation, he was seen a few months ago. States that he is doing fairly steady, he is currently taking improved one tablet daily only, states that he can tolerate this dose with some extent of fatigability. He has chronic dyspnea on exertion which is unchanged. Appetite is good, states that he remains physically active as much as possible. Denies any recurrent  pustular lesions on the tips of his fingers. States that he is trying to get blood sugar better controlled at home. Otherwise denies any progressive lymph node masses on self exam. No fevers. Denies  bleeding symptoms.  REVIEW OF SYSTEMS:   ROS As in HPI above. In addition, no fever, chills or sweats. No new headaches or focal weakness.  No new mood disturbances. No  sore throat, cough, shortness of breath, sputum, hemoptysis or chest pain. No dizziness or palpitation. No abdominal pain, constipation, diarrhea, dysuria or hematuria. No new skin rash or bleeding symptoms. No new paresthesias in extremities.  Otherwise, a complete review of systems is negative.  PAST MEDICAL HISTORY: Reviewed.         Hypertension  Hematuria followed by urology, denies history of bladder cancer  CLL  Hospitalisation july 2015 for diarrhea, renal insufficiency, anemia  Cholecystectomy in 1983  PAST SURGICAL HISTORY: Reviewed. As above  FAMILY HISTORY: Reviewed. Noncontributory.  SOCIAL HISTORY: Reviewed. Chronic smoker since 1970, one pack of cigarettes per day. Does take a small amount of alcohol on a regular basis. No recreational drug usage.  No Known Allergies  Current Outpatient Prescriptions  Medication Sig Dispense Refill  . allopurinol (ZYLOPRIM) 100 MG tablet Take 100 mg by mouth daily.    . cephALEXin (KEFLEX) 250 MG capsule Take by mouth 4 (four) times daily.    Marland Kitchen ibrutinib (IMBRUVICA) 140 MG capsul Take 140 mg by mouth daily.    Marland Kitchen lisinopril (PRINIVIL,ZESTRIL) 5 MG tablet Take 5 mg by mouth daily.    . magnesium oxide (MAG-OX) 400 MG tablet Take 400 mg by mouth daily.    . metFORMIN (GLUMETZA) 1000 MG (MOD) 24 hr tablet Take 1,000 mg by mouth 2 (two) times daily.     No current facility-administered medications for this visit.    PHYSICAL EXAM: Filed Vitals:   11/14/14 1046  BP: 142/82  Pulse: 76  Temp: 97.6 F (36.4 C)  Resp: 18     Body mass index is 32.32 kg/(m^2).    ECOG FS:1 - Symptomatic but completely ambulatory  GENERAL: Patient is alert and oriented and in no acute distress. There is no icterus. HEENT: EOMs intact. Oral exam negative for thrush or lesions. No  cervical lymphadenopathy. CVS: S1S2, regular LUNGS: Bilaterally clear to auscultation, no rhonchi. ABDOMEN: Soft, nontender. No hepatosplenomegaly clinically.  NEURO: grossly nonfocal, cranial nerves are intact. EXTREMITIES: No pedal edema. LYMPHATICS: No palpable adenopathy in axillary or inguinal areas.   LAB RESULTS:    Component Value Date/Time   NA 139 11/14/2014 1020   NA 142 05/03/2014 1139   K 5.0 11/14/2014 1020   K 4.7 05/03/2014 1139   CL 107 11/14/2014 1020   CL 110* 05/03/2014 1139   CO2 25 11/14/2014 1020   CO2 24 05/03/2014 1139   GLUCOSE 155* 11/14/2014 1020   GLUCOSE 98 05/03/2014 1139   BUN 27* 11/14/2014 1020   BUN 20* 05/03/2014 1139   CREATININE 1.59* 11/14/2014 1020   CREATININE 1.22 08/09/2014 1122   CALCIUM 8.4* 11/14/2014 1020   CALCIUM 8.6 05/03/2014 1139   PROT 7.1 11/14/2014 1020   PROT 7.5 05/03/2014 1139   ALBUMIN 4.4 11/14/2014 1020   ALBUMIN 4.1 05/03/2014 1139   AST 23 11/14/2014 1020   AST 15 05/03/2014 1139   ALT 21 11/14/2014 1020   ALT 26 05/03/2014 1139   ALKPHOS 72 11/14/2014 1020   ALKPHOS 94 05/03/2014 1139   BILITOT 0.6 11/14/2014 1020   BILITOT 0.5 05/03/2014 1139   GFRNONAA 44* 11/14/2014 1020   GFRNONAA >60 08/09/2014 1122   GFRAA 51* 11/14/2014 1020   GFRAA >60 08/09/2014 1122   Lab Results  Component Value Date   WBC 7.2 11/14/2014   NEUTROABS 3.3 11/14/2014   HGB 11.3* 11/14/2014   HCT 35.5* 11/14/2014   MCV 101.9* 11/14/2014   PLT 105* 11/14/2014     STUDIES: 03/29/14 - CT chest/abdomen/pelvis. IMPRESSION:  Significant reduction in lymphadenopathy involving the chest, abdomen and pelvis suggesting an excellent response to chemotherapy.   ASSESSMENT / PLAN:   1. Chronic lymphocytic leukemia, stage IV disease given platelet count < 100K, extensive lymphadenopathy on CT scan,bone marrow biopsy showing diffuse involvement by CLL. Also has poor prognostic features with ZAP-70 positivity and deletion long-arm  chromosome 11. Coombs positive hemolytic anemia  -  have reviewed labs from today and discussed with patient. He is currently taking low-dose Ibrutinib at 1 capsule daily. CBC today shows WBC count remains in the normal range, no progressive leukocytosis. He does not have any progressive lymphadenopathy or hepatosplenomegaly on exam either. Patient also does not want to increase dose of Ibrutinib, will continue currently at this dose. Monitor labs every 3 weekly, we did flow cytometry at 3 weeks from now. Next MD follow-up at 18 weeks with labs and make further plan of management.  2. Anemia likely related to CLL - mild, no new symptoms. Will  monitor. 3. Thrombocytopenia likely related to CLL - mild, no bleeding symptoms. Will  Monitor. 4. Skin lesions on bilateral fingers has improved, he was advised to continue to maintain strict control of blood sugar at home.  5. In between visits, patient advised to call or come to ER in case of any fevers, chills, new symptoms or acute sickness. He is agreeable to this plan.     Leia Alf, MD   11/16/2014 7:32  PM

## 2014-12-05 ENCOUNTER — Inpatient Hospital Stay: Payer: Medicare Other | Attending: Internal Medicine

## 2014-12-05 DIAGNOSIS — D6959 Other secondary thrombocytopenia: Secondary | ICD-10-CM | POA: Diagnosis not present

## 2014-12-05 DIAGNOSIS — C9110 Chronic lymphocytic leukemia of B-cell type not having achieved remission: Secondary | ICD-10-CM | POA: Diagnosis present

## 2014-12-05 DIAGNOSIS — D630 Anemia in neoplastic disease: Secondary | ICD-10-CM | POA: Insufficient documentation

## 2014-12-05 LAB — PHOSPHORUS: PHOSPHORUS: 4.2 mg/dL (ref 2.5–4.6)

## 2014-12-05 LAB — CBC WITH DIFFERENTIAL/PLATELET
BASOS PCT: 0 %
Basophils Absolute: 0 10*3/uL (ref 0–0.1)
Eosinophils Absolute: 0.1 10*3/uL (ref 0–0.7)
Eosinophils Relative: 2 %
HEMATOCRIT: 35.3 % — AB (ref 40.0–52.0)
Hemoglobin: 11.5 g/dL — ABNORMAL LOW (ref 13.0–18.0)
LYMPHS ABS: 2.1 10*3/uL (ref 1.0–3.6)
Lymphocytes Relative: 31 %
MCH: 32.4 pg (ref 26.0–34.0)
MCHC: 32.6 g/dL (ref 32.0–36.0)
MCV: 99.3 fL (ref 80.0–100.0)
MONOS PCT: 17 %
Monocytes Absolute: 1.1 10*3/uL — ABNORMAL HIGH (ref 0.2–1.0)
Neutro Abs: 3.4 10*3/uL (ref 1.4–6.5)
Neutrophils Relative %: 50 %
Platelets: 104 10*3/uL — ABNORMAL LOW (ref 150–440)
RBC: 3.55 MIL/uL — AB (ref 4.40–5.90)
RDW: 17.1 % — ABNORMAL HIGH (ref 11.5–14.5)
WBC: 6.7 10*3/uL (ref 3.8–10.6)

## 2014-12-05 LAB — CREATININE, SERUM
CREATININE: 1.27 mg/dL — AB (ref 0.61–1.24)
GFR calc Af Amer: >60 mL/min (ref >60)
GFR calc non Af Amer: 58 mL/min — ABNORMAL LOW (ref >60)

## 2014-12-05 LAB — MAGNESIUM: Magnesium: 2.1 mg/dL (ref 1.7–2.4)

## 2014-12-26 ENCOUNTER — Inpatient Hospital Stay: Payer: Medicare Other

## 2014-12-26 DIAGNOSIS — C9110 Chronic lymphocytic leukemia of B-cell type not having achieved remission: Secondary | ICD-10-CM | POA: Diagnosis not present

## 2014-12-26 LAB — CREATININE, SERUM
Creatinine, Ser: 1.44 mg/dL — ABNORMAL HIGH (ref 0.61–1.24)
GFR calc non Af Amer: 50 mL/min — ABNORMAL LOW (ref >60)
GFR, EST AFRICAN AMERICAN: 57 mL/min — AB (ref >60)

## 2014-12-26 LAB — CBC WITH DIFFERENTIAL/PLATELET
BASOS ABS: 0.1 10*3/uL (ref 0–0.1)
Basophils Relative: 1 %
EOS PCT: 2 %
Eosinophils Absolute: 0.1 10*3/uL (ref 0–0.7)
HEMATOCRIT: 33.5 % — AB (ref 40.0–52.0)
Hemoglobin: 10.8 g/dL — ABNORMAL LOW (ref 13.0–18.0)
LYMPHS ABS: 2.3 10*3/uL (ref 1.0–3.6)
LYMPHS PCT: 33 %
MCH: 32.2 pg (ref 26.0–34.0)
MCHC: 32.4 g/dL (ref 32.0–36.0)
MCV: 99.5 fL (ref 80.0–100.0)
MONOS PCT: 11 %
Monocytes Absolute: 0.7 10*3/uL (ref 0.2–1.0)
Neutro Abs: 3.6 10*3/uL (ref 1.4–6.5)
Neutrophils Relative %: 53 %
Platelets: 92 10*3/uL — ABNORMAL LOW (ref 150–440)
RBC: 3.37 MIL/uL — ABNORMAL LOW (ref 4.40–5.90)
RDW: 17.4 % — AB (ref 11.5–14.5)
WBC: 6.8 10*3/uL (ref 3.8–10.6)

## 2014-12-26 LAB — BASIC METABOLIC PANEL
Anion gap: 5 (ref 5–15)
BUN: 22 mg/dL — AB (ref 6–20)
CHLORIDE: 110 mmol/L (ref 101–111)
CO2: 22 mmol/L (ref 22–32)
Calcium: 8.6 mg/dL — ABNORMAL LOW (ref 8.9–10.3)
Creatinine, Ser: 1.44 mg/dL — ABNORMAL HIGH (ref 0.61–1.24)
GFR calc non Af Amer: 50 mL/min — ABNORMAL LOW (ref >60)
GFR, EST AFRICAN AMERICAN: 57 mL/min — AB (ref >60)
Glucose, Bld: 163 mg/dL — ABNORMAL HIGH (ref 65–99)
POTASSIUM: 4.7 mmol/L (ref 3.5–5.1)
SODIUM: 137 mmol/L (ref 135–145)

## 2014-12-26 LAB — HEPATIC FUNCTION PANEL
ALBUMIN: 4.2 g/dL (ref 3.5–5.0)
ALT: 16 U/L — ABNORMAL LOW (ref 17–63)
AST: 18 U/L (ref 15–41)
Alkaline Phosphatase: 61 U/L (ref 38–126)
BILIRUBIN TOTAL: 0.5 mg/dL (ref 0.3–1.2)
Bilirubin, Direct: <0.1 mg/dL — ABNORMAL LOW (ref 0.1–0.5)
Total Protein: 7 g/dL (ref 6.5–8.1)

## 2014-12-26 LAB — MAGNESIUM: Magnesium: 1.8 mg/dL (ref 1.7–2.4)

## 2014-12-26 LAB — PHOSPHORUS: Phosphorus: 3.3 mg/dL (ref 2.5–4.6)

## 2014-12-27 LAB — COMP PANEL: LEUKEMIA/LYMPHOMA

## 2015-01-16 ENCOUNTER — Inpatient Hospital Stay: Payer: Medicare Other | Attending: Internal Medicine

## 2015-01-16 DIAGNOSIS — C9110 Chronic lymphocytic leukemia of B-cell type not having achieved remission: Secondary | ICD-10-CM | POA: Insufficient documentation

## 2015-01-16 LAB — BASIC METABOLIC PANEL
ANION GAP: 6 (ref 5–15)
BUN: 14 mg/dL (ref 6–20)
CALCIUM: 8.3 mg/dL — AB (ref 8.9–10.3)
CO2: 24 mmol/L (ref 22–32)
Chloride: 106 mmol/L (ref 101–111)
Creatinine, Ser: 1.25 mg/dL — ABNORMAL HIGH (ref 0.61–1.24)
GFR, EST NON AFRICAN AMERICAN: 59 mL/min — AB (ref >60)
Glucose, Bld: 145 mg/dL — ABNORMAL HIGH (ref 65–99)
POTASSIUM: 4.6 mmol/L (ref 3.5–5.1)
Sodium: 136 mmol/L (ref 135–145)

## 2015-01-16 LAB — HEPATIC FUNCTION PANEL
ALBUMIN: 4.4 g/dL (ref 3.5–5.0)
ALT: 15 U/L — AB (ref 17–63)
AST: 17 U/L (ref 15–41)
Alkaline Phosphatase: 71 U/L (ref 38–126)
Bilirubin, Direct: <0.1 mg/dL — ABNORMAL LOW (ref 0.1–0.5)
TOTAL PROTEIN: 7.1 g/dL (ref 6.5–8.1)
Total Bilirubin: 0.6 mg/dL (ref 0.3–1.2)

## 2015-01-16 LAB — CBC WITH DIFFERENTIAL/PLATELET
BASOS ABS: 0.1 10*3/uL (ref 0–0.1)
BASOS PCT: 1 %
EOS ABS: 0.1 10*3/uL (ref 0–0.7)
Eosinophils Relative: 2 %
HEMATOCRIT: 32.9 % — AB (ref 40.0–52.0)
HEMOGLOBIN: 10.6 g/dL — AB (ref 13.0–18.0)
Lymphocytes Relative: 38 %
Lymphs Abs: 2.5 10*3/uL (ref 1.0–3.6)
MCH: 32.3 pg (ref 26.0–34.0)
MCHC: 32.1 g/dL (ref 32.0–36.0)
MCV: 100.7 fL — ABNORMAL HIGH (ref 80.0–100.0)
MONOS PCT: 12 %
Monocytes Absolute: 0.8 10*3/uL (ref 0.2–1.0)
NEUTROS ABS: 3.1 10*3/uL (ref 1.4–6.5)
NEUTROS PCT: 47 %
Platelets: 108 10*3/uL — ABNORMAL LOW (ref 150–440)
RBC: 3.27 MIL/uL — AB (ref 4.40–5.90)
RDW: 17.8 % — ABNORMAL HIGH (ref 11.5–14.5)
WBC: 6.7 10*3/uL (ref 3.8–10.6)

## 2015-01-16 LAB — PHOSPHORUS: Phosphorus: 3.5 mg/dL (ref 2.5–4.6)

## 2015-01-16 LAB — MAGNESIUM: MAGNESIUM: 1.9 mg/dL (ref 1.7–2.4)

## 2015-02-06 ENCOUNTER — Inpatient Hospital Stay: Payer: Medicare Other | Attending: Internal Medicine

## 2015-02-06 DIAGNOSIS — I10 Essential (primary) hypertension: Secondary | ICD-10-CM | POA: Insufficient documentation

## 2015-02-06 DIAGNOSIS — R5383 Other fatigue: Secondary | ICD-10-CM | POA: Diagnosis not present

## 2015-02-06 DIAGNOSIS — C9110 Chronic lymphocytic leukemia of B-cell type not having achieved remission: Secondary | ICD-10-CM | POA: Diagnosis present

## 2015-02-06 DIAGNOSIS — L739 Follicular disorder, unspecified: Secondary | ICD-10-CM | POA: Diagnosis not present

## 2015-02-06 DIAGNOSIS — F17210 Nicotine dependence, cigarettes, uncomplicated: Secondary | ICD-10-CM | POA: Diagnosis not present

## 2015-02-06 DIAGNOSIS — Z7984 Long term (current) use of oral hypoglycemic drugs: Secondary | ICD-10-CM | POA: Diagnosis not present

## 2015-02-06 DIAGNOSIS — Z79899 Other long term (current) drug therapy: Secondary | ICD-10-CM | POA: Diagnosis not present

## 2015-02-06 DIAGNOSIS — Z23 Encounter for immunization: Secondary | ICD-10-CM | POA: Insufficient documentation

## 2015-02-06 DIAGNOSIS — B9561 Methicillin susceptible Staphylococcus aureus infection as the cause of diseases classified elsewhere: Secondary | ICD-10-CM | POA: Insufficient documentation

## 2015-02-06 LAB — CBC WITH DIFFERENTIAL/PLATELET
Basophils Absolute: 0 10*3/uL (ref 0–0.1)
Basophils Relative: 0 %
EOS ABS: 0.1 10*3/uL (ref 0–0.7)
Eosinophils Relative: 1 %
HCT: 33.4 % — ABNORMAL LOW (ref 40.0–52.0)
HEMOGLOBIN: 10.9 g/dL — AB (ref 13.0–18.0)
LYMPHS ABS: 2.1 10*3/uL (ref 1.0–3.6)
LYMPHS PCT: 30 %
MCH: 32.4 pg (ref 26.0–34.0)
MCHC: 32.5 g/dL (ref 32.0–36.0)
MCV: 99.7 fL (ref 80.0–100.0)
MONOS PCT: 11 %
Monocytes Absolute: 0.8 10*3/uL (ref 0.2–1.0)
NEUTROS PCT: 58 %
Neutro Abs: 4.2 10*3/uL (ref 1.4–6.5)
Platelets: 108 10*3/uL — ABNORMAL LOW (ref 150–440)
RBC: 3.35 MIL/uL — AB (ref 4.40–5.90)
RDW: 17.5 % — ABNORMAL HIGH (ref 11.5–14.5)
WBC: 7.2 10*3/uL (ref 3.8–10.6)

## 2015-02-06 LAB — CREATININE, SERUM
Creatinine, Ser: 1.23 mg/dL (ref 0.61–1.24)
GFR calc Af Amer: >60 mL/min (ref >60)
GFR calc non Af Amer: 60 mL/min — ABNORMAL LOW (ref >60)

## 2015-02-06 LAB — MAGNESIUM: MAGNESIUM: 1.9 mg/dL (ref 1.7–2.4)

## 2015-02-06 LAB — PHOSPHORUS: PHOSPHORUS: 3.3 mg/dL (ref 2.5–4.6)

## 2015-02-09 ENCOUNTER — Telehealth: Payer: Self-pay | Admitting: *Deleted

## 2015-02-09 NOTE — Telephone Encounter (Signed)
Per md no labs needed, pt to stop Imbruvica. I called pt and gave him instructions to stop Imbruvica, he repeated this to me. I also informed him that he does not need labs drawn tomorrow and to come  In closer to 230 and he replied alright

## 2015-02-09 NOTE — Telephone Encounter (Signed)
Asking to be seen by md for nausea, weakness and sores on his hands again. Appt given for tomorrow at 230 and agreed on by patient

## 2015-02-10 ENCOUNTER — Encounter: Payer: Self-pay | Admitting: Internal Medicine

## 2015-02-10 ENCOUNTER — Inpatient Hospital Stay: Payer: Medicare Other

## 2015-02-10 ENCOUNTER — Inpatient Hospital Stay (HOSPITAL_BASED_OUTPATIENT_CLINIC_OR_DEPARTMENT_OTHER): Payer: Medicare Other | Admitting: Internal Medicine

## 2015-02-10 ENCOUNTER — Other Ambulatory Visit: Payer: Self-pay

## 2015-02-10 VITALS — BP 165/102 | HR 92 | Temp 98.8°F | Resp 18 | Ht 68.0 in | Wt 211.9 lb

## 2015-02-10 DIAGNOSIS — I10 Essential (primary) hypertension: Secondary | ICD-10-CM

## 2015-02-10 DIAGNOSIS — Z79899 Other long term (current) drug therapy: Secondary | ICD-10-CM

## 2015-02-10 DIAGNOSIS — F17210 Nicotine dependence, cigarettes, uncomplicated: Secondary | ICD-10-CM

## 2015-02-10 DIAGNOSIS — R5383 Other fatigue: Secondary | ICD-10-CM

## 2015-02-10 DIAGNOSIS — L739 Follicular disorder, unspecified: Secondary | ICD-10-CM

## 2015-02-10 DIAGNOSIS — Z7984 Long term (current) use of oral hypoglycemic drugs: Secondary | ICD-10-CM

## 2015-02-10 DIAGNOSIS — C9110 Chronic lymphocytic leukemia of B-cell type not having achieved remission: Secondary | ICD-10-CM

## 2015-02-10 MED ORDER — CLINDAMYCIN HCL 300 MG PO CAPS: 300.0000 mg | ORAL_CAPSULE | Freq: Four times a day (QID) | ORAL | Status: DC

## 2015-02-10 NOTE — Progress Notes (Signed)
[facility] [place] OFFICE PROGRESS NOTE  Patient Care Team: [name], MD as PCP - General (Internal Medicine)   SUMMARY OF ONCOLOGIC HISTORY: # CLL-relapsed; on  ibrutinib   INTERVAL HISTORY:  A very pleasant 64 year old male patient with above history of CLL relapsed currently on ibrutinib since August 2015 is here for to be evaluated for his skin lesions on his hand.  He noticed to have swelling of his right hand along with skin lesions on his hand; along with lesions on his chest; face and posterior trunk. He denies any lesions on his thighs etc.   Denies any fevers. He complains of fatigue.  Otherwise denies any nausea vomiting or chest pain or shortness of breath or cough   REVIEW OF SYSTEMS:  A complete 10 point review of system is done which is negative except mentioned above/history of present illness.   PAST MEDICAL HISTORY :  Past Medical History  Diagnosis Date  . CLL (chronic lymphocytic leukemia) ([place])   . Hypertension   . Hematuria     PAST SURGICAL HISTORY :   Past Surgical History  Procedure Laterality Date  . Cholecystectomy  1983    FAMILY HISTORY :  History reviewed. No pertinent family history.  SOCIAL HISTORY:   Social History  Substance Use Topics  . Smoking status: Current Every Day Smoker -- 0.50 packs/day for 46 years  . Smokeless tobacco: Never Used  . Alcohol Use: 1.8 oz/week    3 Glasses of wine per week     Comment: 3 a week    ALLERGIES:  has No Known Allergies.  MEDICATIONS:  Current Outpatient Prescriptions  Medication Sig Dispense Refill  . ibrutinib (IMBRUVICA) 140 MG capsul Take 140 mg by mouth daily.    Marland Kitchen lisinopril (PRINIVIL,ZESTRIL) 5 MG tablet Take 5 mg by mouth daily.    . metFORMIN (GLUMETZA) 1000 MG (MOD) 24 hr tablet Take 1,000 mg by mouth 2 (two) times daily.     No current facility-administered medications for this visit.    PHYSICAL EXAMINATION: ECOG PERFORMANCE STATUS: 1 - Symptomatic but  completely ambulatory  BP 165/102 mmHg  Pulse 92  Temp(Src) 98.8 F (37.1 C) (Tympanic)  Resp 18  Ht 5\' 8"  (1.727 m)  Wt 211 lb 13.8 oz (96.1 kg)  BMI 32.22 kg/m2  Filed Weights   02/10/15 1432  Weight: 211 lb 13.8 oz (96.1 kg)    GENERAL: Well-nourished well-developed; Alert, no distress and comfortable.   Alone.YES: no pallor or icterus OROPHARYNX: no thrush or ulceration; good dentition  NECK: supple, no masses felt LYMPH:  no palpable lymphadenopathy in the cervical, axillary or inguinal regions LUNGS: clear to auscultation and  No wheeze or crackles HEART/CVS: regular rate & rhythm and no murmurs; No lower extremity edema ABDOMEN:abdomen soft, non-tender and normal bowel sounds Musculoskeletal:no cyanosis of digits and no clubbing  PSYCH: alert & oriented x 3 with fluent speech NEURO: no focal motor/sensory deficits SKIN:  right hand dorsal surface near the wrist -erythema multiple scabbed lesions; no point fluctuation noted. Multiple lesions noted anterior chest / posterior chest.   LABORATORY DATA:  I have reviewed the data as listed    Component Value Date/Time   NA 136 01/16/2015 1051   NA 142 05/03/2014 1139   K 4.6 01/16/2015 1051   K 4.7 05/03/2014 1139   CL 106 01/16/2015 1051   CL 110* 05/03/2014 1139   CO2 24 01/16/2015 1051   CO2 24 05/03/2014 1139   GLUCOSE  145* 01/16/2015 1051   GLUCOSE 98 05/03/2014 1139   BUN 14 01/16/2015 1051   BUN 20* 05/03/2014 1139   CREATININE 1.23 02/06/2015 1049   CREATININE 1.22 08/09/2014 1122   CALCIUM 8.3* 01/16/2015 1051   CALCIUM 8.6 05/03/2014 1139   PROT 7.1 01/16/2015 1051   PROT 7.5 05/03/2014 1139   ALBUMIN 4.4 01/16/2015 1051   ALBUMIN 4.1 05/03/2014 1139   AST 17 01/16/2015 1051   AST 15 05/03/2014 1139   ALT 15* 01/16/2015 1051   ALT 26 05/03/2014 1139   ALKPHOS 71 01/16/2015 1051   ALKPHOS 94 05/03/2014 1139   BILITOT 0.6 01/16/2015 1051   BILITOT 0.5 05/03/2014 1139   GFRNONAA 60* 02/06/2015 1049    GFRNONAA >60 08/09/2014 1122   GFRNONAA 55* 05/24/2014 1124   GFRAA >60 02/06/2015 1049   GFRAA >60 08/09/2014 1122   GFRAA >60 05/24/2014 1124    No results found for: SPEP, UPEP  Lab Results  Component Value Date   WBC 7.2 02/06/2015   NEUTROABS 4.2 02/06/2015   HGB 10.9* 02/06/2015   HCT 33.4* 02/06/2015   MCV 99.7 02/06/2015   PLT 108* 02/06/2015      Chemistry      Component Value Date/Time   NA 136 01/16/2015 1051   NA 142 05/03/2014 1139   K 4.6 01/16/2015 1051   K 4.7 05/03/2014 1139   CL 106 01/16/2015 1051   CL 110* 05/03/2014 1139   CO2 24 01/16/2015 1051   CO2 24 05/03/2014 1139   BUN 14 01/16/2015 1051   BUN 20* 05/03/2014 1139   CREATININE 1.23 02/06/2015 1049   CREATININE 1.22 08/09/2014 1122      Component Value Date/Time   CALCIUM 8.3* 01/16/2015 1051   CALCIUM 8.6 05/03/2014 1139   ALKPHOS 71 01/16/2015 1051   ALKPHOS 94 05/03/2014 1139   AST 17 01/16/2015 1051   AST 15 05/03/2014 1139   ALT 15* 01/16/2015 1051   ALT 26 05/03/2014 1139   BILITOT 0.6 01/16/2015 1051   BILITOT 0.5 05/03/2014 1139       RADIOGRAPHIC STUDIES: I have personally reviewed the radiological images as listed and agreed with the findings in the report. No results found.   ASSESSMENT & PLAN:   # Folliculitis/soft tissue infection- clinically suspicious for MRSA infection. Check the cultures. Empirically start the patient on clindamycin 300 mg every 6 hours for 10 days.  # CLL ibrutinib since August 2015. Hold given above skin lesions.     No orders of the defined types were placed in this encounter.   All questions were answered. The patient knows to call the clinic with any problems, questions or concerns. No barriers to learning was detected. I spent 15 minutes counseling the patient face to face. The total time spent in the appointment was 30 minutes and more than 50% was on counseling and review of test results     Cammie Sickle, MD 02/10/2015  2:58 PM

## 2015-02-10 NOTE — Progress Notes (Signed)
Pt has lesions on right hand with brownish drainage and swelling and pain.  Pt has another one on right chest with scab.  Pt has weveral on left side of face and old ones with scabs on left arm.  Possibly one on his back of left ear. Pt feels fatigue is worse than when he was here before.

## 2015-02-13 LAB — WOUND CULTURE

## 2015-02-20 ENCOUNTER — Encounter: Payer: Self-pay | Admitting: Internal Medicine

## 2015-02-20 ENCOUNTER — Inpatient Hospital Stay: Payer: Medicare Other

## 2015-02-20 ENCOUNTER — Inpatient Hospital Stay (HOSPITAL_BASED_OUTPATIENT_CLINIC_OR_DEPARTMENT_OTHER): Payer: Medicare Other | Admitting: Internal Medicine

## 2015-02-20 ENCOUNTER — Ambulatory Visit: Payer: Medicare Other

## 2015-02-20 VITALS — BP 148/81 | HR 76 | Temp 98.3°F | Resp 18 | Wt 212.9 lb

## 2015-02-20 DIAGNOSIS — C9112 Chronic lymphocytic leukemia of B-cell type in relapse: Secondary | ICD-10-CM

## 2015-02-20 DIAGNOSIS — I10 Essential (primary) hypertension: Secondary | ICD-10-CM

## 2015-02-20 DIAGNOSIS — B9561 Methicillin susceptible Staphylococcus aureus infection as the cause of diseases classified elsewhere: Secondary | ICD-10-CM | POA: Diagnosis not present

## 2015-02-20 DIAGNOSIS — F17210 Nicotine dependence, cigarettes, uncomplicated: Secondary | ICD-10-CM

## 2015-02-20 DIAGNOSIS — C9192 Lymphoid leukemia, unspecified, in relapse: Secondary | ICD-10-CM

## 2015-02-20 DIAGNOSIS — C9110 Chronic lymphocytic leukemia of B-cell type not having achieved remission: Secondary | ICD-10-CM | POA: Diagnosis not present

## 2015-02-20 DIAGNOSIS — Z7984 Long term (current) use of oral hypoglycemic drugs: Secondary | ICD-10-CM

## 2015-02-20 DIAGNOSIS — Z79899 Other long term (current) drug therapy: Secondary | ICD-10-CM

## 2015-02-20 DIAGNOSIS — R5383 Other fatigue: Secondary | ICD-10-CM

## 2015-02-20 DIAGNOSIS — L739 Follicular disorder, unspecified: Secondary | ICD-10-CM | POA: Diagnosis not present

## 2015-02-20 DIAGNOSIS — Z23 Encounter for immunization: Secondary | ICD-10-CM

## 2015-02-20 LAB — COMPREHENSIVE METABOLIC PANEL
ALT: 18 U/L (ref 17–63)
ANION GAP: 7 (ref 5–15)
AST: 25 U/L (ref 15–41)
Albumin: 4.7 g/dL (ref 3.5–5.0)
Alkaline Phosphatase: 84 U/L (ref 38–126)
BUN: 23 mg/dL — ABNORMAL HIGH (ref 6–20)
CHLORIDE: 104 mmol/L (ref 101–111)
CO2: 23 mmol/L (ref 22–32)
Calcium: 8.4 mg/dL — ABNORMAL LOW (ref 8.9–10.3)
Creatinine, Ser: 1.21 mg/dL (ref 0.61–1.24)
Glucose, Bld: 118 mg/dL — ABNORMAL HIGH (ref 65–99)
POTASSIUM: 4.7 mmol/L (ref 3.5–5.1)
SODIUM: 134 mmol/L — AB (ref 135–145)
Total Bilirubin: 0.4 mg/dL (ref 0.3–1.2)
Total Protein: 7.8 g/dL (ref 6.5–8.1)

## 2015-02-20 LAB — CBC WITH DIFFERENTIAL/PLATELET
BASOS ABS: 0.4 10*3/uL — AB (ref 0–0.1)
BASOS PCT: 4 %
Eosinophils Absolute: 0.2 10*3/uL (ref 0–0.7)
Eosinophils Relative: 3 %
HEMATOCRIT: 35.3 % — AB (ref 40.0–52.0)
HEMOGLOBIN: 11.4 g/dL — AB (ref 13.0–18.0)
Lymphocytes Relative: 42 %
Lymphs Abs: 3.9 10*3/uL — ABNORMAL HIGH (ref 1.0–3.6)
MCH: 31.5 pg (ref 26.0–34.0)
MCHC: 32.2 g/dL (ref 32.0–36.0)
MCV: 97.9 fL (ref 80.0–100.0)
Monocytes Absolute: 0.6 10*3/uL (ref 0.2–1.0)
Monocytes Relative: 7 %
NEUTROS ABS: 4.1 10*3/uL (ref 1.4–6.5)
NEUTROS PCT: 44 %
PLATELETS: 175 10*3/uL (ref 150–440)
RBC: 3.61 MIL/uL — AB (ref 4.40–5.90)
RDW: 17.4 % — AB (ref 11.5–14.5)
WBC: 9.3 10*3/uL (ref 3.8–10.6)

## 2015-02-20 LAB — LACTATE DEHYDROGENASE: LDH: 161 U/L (ref 98–192)

## 2015-02-20 MED ORDER — CLINDAMYCIN HCL 300 MG PO CAPS: 300.0000 mg | ORAL_CAPSULE | Freq: Four times a day (QID) | ORAL | Status: DC

## 2015-02-20 MED ORDER — INFLUENZA VAC SPLIT QUAD 0.5 ML IM SUSY
0.5000 mL | PREFILLED_SYRINGE | Freq: Once | INTRAMUSCULAR | Status: AC
  Administered 2015-02-20: 0.5 mL via INTRAMUSCULAR
  Filled 2015-02-20: qty 0.5

## 2015-02-20 NOTE — Progress Notes (Signed)
[place] OFFICE PROGRESS NOTE  Patient Care Team: [name], MD as PCP - General (Internal Medicine)   SUMMARY OF ONCOLOGIC HISTORY: # 2006- CLL STAGE IV; MAY 2011- WBC- 57K;Platelets-99;Hb-12/CT Bulky LN; BMBx- 80% Invol; del 11; START Benda-Ritux x4 [finished Sep 2011];   # July 2015-Progression; Sep 2015-START ibrutinib; CT scan DEC 2015- Improvement LN; Cont Ibrutinib  # MSSA skin infection [Oct 2016]   INTERVAL HISTORY:  64 year old male patient with history of relapsed CLL currently on second line therapy ibrutinib since September 2016 for follow-up.  Given recent diagnosis of infection of his skin- ibrutinib is on hold. Is currently on clindamycin. Patient noted significant improvement of his skin lesions on clindamycin.  Denies any weight loss; does admit to fatigue. Otherwise denies any nausea vomiting or chest pain or shortness of breath or cough   REVIEW OF SYSTEMS:  A complete 10 point review of system is done which is negative except mentioned above/history of present illness.   PAST MEDICAL HISTORY :  Past Medical History  Diagnosis Date  . CLL (chronic lymphocytic leukemia) ([place])   . Hypertension   . Hematuria     PAST SURGICAL HISTORY :   Past Surgical History  Procedure Laterality Date  . Cholecystectomy  1983    FAMILY HISTORY :  No family history on file.  SOCIAL HISTORY:   Social History  Substance Use Topics  . Smoking status: Current Every Day Smoker -- 0.50 packs/day for 46 years  . Smokeless tobacco: Never Used  . Alcohol Use: 1.8 oz/week    3 Glasses of wine per week     Comment: 3 a week    ALLERGIES:  has No Known Allergies.  MEDICATIONS:  Current Outpatient Prescriptions  Medication Sig Dispense Refill  . clindamycin (CLEOCIN) 300 MG capsule Take 1 capsule (300 mg total) by mouth 4 (four) times daily. 20 capsule 0  . ibrutinib (IMBRUVICA) 140 MG capsul Take 140 mg by mouth daily.    Marland Kitchen lisinopril  (PRINIVIL,ZESTRIL) 5 MG tablet Take 5 mg by mouth daily.    . metFORMIN (GLUMETZA) 1000 MG (MOD) 24 hr tablet Take 1,000 mg by mouth 2 (two) times daily.     Current Facility-Administered Medications  Medication Dose Route Frequency Provider Last Rate Last Dose  . Influenza vac split quadrivalent PF (FLUARIX) injection 0.5 mL  0.5 mL Intramuscular Once Cammie Sickle, MD        PHYSICAL EXAMINATION: ECOG PERFORMANCE STATUS: 1 - Symptomatic but completely ambulatory  BP 148/81 mmHg  Pulse 76  Temp(Src) 98.3 F (36.8 C) (Tympanic)  Resp 18  Wt 212 lb 13.7 oz (96.55 kg)  Filed Weights   02/20/15 0845  Weight: 212 lb 13.7 oz (96.55 kg)    GENERAL: Well-nourished well-developed; Alert, no distress and comfortable.   Alone. EYES: no pallor or icterus OROPHARYNX: no thrush or ulceration; good dentition  NECK: supple, no masses felt LYMPH:  no palpable lymphadenopathy in the cervical, axillary or inguinal regions LUNGS: clear to auscultation and  No wheeze or crackles HEART/CVS: regular rate & rhythm and no murmurs; No lower extremity edema ABDOMEN:abdomen soft, non-tender and normal bowel sounds Musculoskeletal:no cyanosis of digits and no clubbing  PSYCH: alert & oriented x 3 with fluent speech NEURO: no focal motor/sensory deficits SKIN:  right hand dorsal surface near the wrist / Multiple lesions noted anterior chest / posterior chest- scabbing/erythema improved. No new lesions.  LABORATORY DATA:  I have reviewed the data as  listed    Component Value Date/Time   NA 136 01/16/2015 1051   NA 142 05/03/2014 1139   K 4.6 01/16/2015 1051   K 4.7 05/03/2014 1139   CL 106 01/16/2015 1051   CL 110* 05/03/2014 1139   CO2 24 01/16/2015 1051   CO2 24 05/03/2014 1139   GLUCOSE 145* 01/16/2015 1051   GLUCOSE 98 05/03/2014 1139   BUN 14 01/16/2015 1051   BUN 20* 05/03/2014 1139   CREATININE 1.23 02/06/2015 1049   CREATININE 1.22 08/09/2014 1122   CALCIUM 8.3* 01/16/2015 1051    CALCIUM 8.6 05/03/2014 1139   PROT 7.1 01/16/2015 1051   PROT 7.5 05/03/2014 1139   ALBUMIN 4.4 01/16/2015 1051   ALBUMIN 4.1 05/03/2014 1139   AST 17 01/16/2015 1051   AST 15 05/03/2014 1139   ALT 15* 01/16/2015 1051   ALT 26 05/03/2014 1139   ALKPHOS 71 01/16/2015 1051   ALKPHOS 94 05/03/2014 1139   BILITOT 0.6 01/16/2015 1051   BILITOT 0.5 05/03/2014 1139   GFRNONAA 60* 02/06/2015 1049   GFRNONAA >60 08/09/2014 1122   GFRNONAA 55* 05/24/2014 1124   GFRAA >60 02/06/2015 1049   GFRAA >60 08/09/2014 1122   GFRAA >60 05/24/2014 1124    No results found for: SPEP, UPEP  Lab Results  Component Value Date   WBC 7.2 02/06/2015   NEUTROABS 4.2 02/06/2015   HGB 10.9* 02/06/2015   HCT 33.4* 02/06/2015   MCV 99.7 02/06/2015   PLT 108* 02/06/2015      Chemistry      Component Value Date/Time   NA 136 01/16/2015 1051   NA 142 05/03/2014 1139   K 4.6 01/16/2015 1051   K 4.7 05/03/2014 1139   CL 106 01/16/2015 1051   CL 110* 05/03/2014 1139   CO2 24 01/16/2015 1051   CO2 24 05/03/2014 1139   BUN 14 01/16/2015 1051   BUN 20* 05/03/2014 1139   CREATININE 1.23 02/06/2015 1049   CREATININE 1.22 08/09/2014 1122      Component Value Date/Time   CALCIUM 8.3* 01/16/2015 1051   CALCIUM 8.6 05/03/2014 1139   ALKPHOS 71 01/16/2015 1051   ALKPHOS 94 05/03/2014 1139   AST 17 01/16/2015 1051   AST 15 05/03/2014 1139   ALT 15* 01/16/2015 1051   ALT 26 05/03/2014 1139   BILITOT 0.6 01/16/2015 1051   BILITOT 0.5 05/03/2014 1139       RADIOGRAPHIC STUDIES: I have personally reviewed the radiological images as listed and agreed with the findings in the report. No results found.   ASSESSMENT & PLAN:   # CLL/SLL- relapsed currently on second line therapy with ibrutinib September 2016. Patient had prior 11 P deletion; I would recommend checking peripheral blood fish panel; and also checking IGV H mutation status. Also check restaging CT chest abdomen pelvis. Hold ibrutinib for  now.  # Folliculitis/soft tissue infection- positive for MSSA infection. Significant improvement noted on clindamycin 300 mg every 6 hours continue for a total of 14 days.  # Patient given a flu shot.  # Patient follow-up with me in approximately 2 weeks or so with above scans and blood work results.    Orders Placed This Encounter  Procedures  . CT Abdomen Pelvis W Contrast    Standing Status: Future     Number of Occurrences:      Standing Expiration Date: 05/21/2016    Order Specific Question:  Reason for Exam (SYMPTOM  OR DIAGNOSIS REQUIRED)  Answer:  lymphoma/ CLL    Order Specific Question:  Preferred imaging location?    Answer:  [facility]  . CT Chest W Contrast    Standing Status: Future     Number of Occurrences:      Standing Expiration Date: 04/21/2016    Order Specific Question:  Reason for Exam (SYMPTOM  OR DIAGNOSIS REQUIRED)    Answer:  lymphoma/ CLL    Order Specific Question:  Preferred imaging location?    Answer:  [facility]  . Lactate dehydrogenase    Standing Status: Future     Number of Occurrences:      Standing Expiration Date: 03/26/2016  . CBC with Differential/Platelet    Standing Status: Future     Number of Occurrences:      Standing Expiration Date: 03/26/2016  . Comprehensive metabolic panel    Standing Status: Future     Number of Occurrences:      Standing Expiration Date: 03/26/2016  . Miscellaneous [facility] test (send-out)    Standing Status: Future     Number of Occurrences:      Standing Expiration Date: 03/26/2016    Order Specific Question:  Test name / description:    Answer:  CLL FISH peripheral blood  . Other/Misc lab test    [facility] mutation testing- [id number] [lavender tube]    Standing Status: Future     Number of Occurrences:      Standing Expiration Date: 03/26/2016   All questions were answered. The patient knows to call the clinic with any problems, questions or concerns. No barriers to learning was  detected.  I spent 25 minutes counseling the patient face to face. The total time spent in the appointment was 30 minutes and more than 50% was on counseling and review of test results     Cammie Sickle, MD 02/20/2015 9:22 AM

## 2015-02-20 NOTE — Progress Notes (Signed)
Patient wants paperwork for handicap placard filled out by MD.

## 2015-02-23 LAB — COMP PANEL: LEUKEMIA/LYMPHOMA

## 2015-02-27 ENCOUNTER — Inpatient Hospital Stay: Payer: Medicare Other

## 2015-03-02 ENCOUNTER — Ambulatory Visit
Admission: RE | Admit: 2015-03-02 | Discharge: 2015-03-02 | Disposition: A | Discharge status: Home / Self Care | Payer: Medicare Other | Source: Ambulatory Visit | Attending: Internal Medicine | Admitting: Internal Medicine

## 2015-03-02 DIAGNOSIS — C91Z2 Other lymphoid leukemia, in relapse: Secondary | ICD-10-CM | POA: Insufficient documentation

## 2015-03-02 DIAGNOSIS — C9112 Chronic lymphocytic leukemia of B-cell type in relapse: Secondary | ICD-10-CM

## 2015-03-02 DIAGNOSIS — C9192 Lymphoid leukemia, unspecified, in relapse: Secondary | ICD-10-CM

## 2015-03-02 HISTORY — DX: Type 2 diabetes mellitus without complications: E11.9

## 2015-03-02 LAB — FISH HES LEUKEMIA, 4Q12 REA

## 2015-03-02 MED ORDER — IOHEXOL 300 MG/ML  SOLN
100.0000 mL | Freq: Once | INTRAMUSCULAR | Status: AC | PRN
  Administered 2015-03-02: 100 mL via INTRAVENOUS

## 2015-03-06 ENCOUNTER — Encounter: Payer: Self-pay | Admitting: Internal Medicine

## 2015-03-06 ENCOUNTER — Inpatient Hospital Stay: Payer: Medicare Other | Attending: Internal Medicine | Admitting: Internal Medicine

## 2015-03-06 VITALS — BP 138/87 | HR 94 | Temp 98.4°F | Resp 18 | Ht 68.0 in | Wt 208.1 lb

## 2015-03-06 DIAGNOSIS — E119 Type 2 diabetes mellitus without complications: Secondary | ICD-10-CM | POA: Insufficient documentation

## 2015-03-06 DIAGNOSIS — L739 Follicular disorder, unspecified: Secondary | ICD-10-CM | POA: Diagnosis not present

## 2015-03-06 DIAGNOSIS — C9110 Chronic lymphocytic leukemia of B-cell type not having achieved remission: Secondary | ICD-10-CM

## 2015-03-06 DIAGNOSIS — Z7984 Long term (current) use of oral hypoglycemic drugs: Secondary | ICD-10-CM | POA: Insufficient documentation

## 2015-03-06 DIAGNOSIS — I10 Essential (primary) hypertension: Secondary | ICD-10-CM | POA: Insufficient documentation

## 2015-03-06 DIAGNOSIS — C9112 Chronic lymphocytic leukemia of B-cell type in relapse: Secondary | ICD-10-CM | POA: Diagnosis present

## 2015-03-06 DIAGNOSIS — Z79899 Other long term (current) drug therapy: Secondary | ICD-10-CM | POA: Insufficient documentation

## 2015-03-06 DIAGNOSIS — F17200 Nicotine dependence, unspecified, uncomplicated: Secondary | ICD-10-CM | POA: Diagnosis not present

## 2015-03-06 MED ORDER — IBRUTINIB 140 MG PO CAPS: 280.0000 mg | ORAL_CAPSULE | Freq: Every day | ORAL | Status: DC

## 2015-03-06 NOTE — Progress Notes (Signed)
[place] OFFICE PROGRESS NOTE  Patient Care Team: [name], MD as PCP - General (Internal Medicine)   SUMMARY OF ONCOLOGIC HISTORY: # 2006- CLL STAGE IV; MAY 2011- WBC- 57K;Platelets-99;Hb-12/CT Bulky LN; BMBx- 80% Invol; del 11; START Benda-Ritux x4 [finished Sep 2011];   # July 2015-Progression; Sep 2015-START ibrutinib; CT scan DEC 2015- Improvement LN; Cont Ibrutinib 140mg /d; NOV CT- 1-2CM LN [mild progression compared to Dec 2015];NOV 2016- FISH peripheral blood- NO MUTATIONS/CD-38 Positive;; NOV 7th- CONT IBRUTINIB 2 pills/day  # MSSA skin infection [Oct 2016] s/p clinda   INTERVAL HISTORY:  64 year old male patient with history of relapsed CLL currently on second line therapy ibrutinib since September 2015 for follow-up.  Given his recent MSSA infection of the skin-  His ibrutinib is on hold. Is here to review the results of his restaging CAT scans.  He admits to getting up of his skin lesions.  No fever no chills. Denies any weight loss; does admit to fatigue. Otherwise denies any nausea vomiting or chest pain or shortness of breath or cough.    REVIEW OF SYSTEMS:  A complete 10 point review of system is done which is negative except mentioned above/history of present illness.   PAST MEDICAL HISTORY :  Past Medical History  Diagnosis Date  . CLL (chronic lymphocytic leukemia) ([place])   . Hypertension   . Hematuria   . Diabetes mellitus without complication ([place])     PAST SURGICAL HISTORY :   Past Surgical History  Procedure Laterality Date  . Cholecystectomy  1983    FAMILY HISTORY :   Family History  Problem Relation Age of Onset  . Hypertension Sister     SOCIAL HISTORY:   Social History  Substance Use Topics  . Smoking status: Current Every Day Smoker -- 0.50 packs/day for 46 years    Types: Cigarettes  . Smokeless tobacco: Never Used  . Alcohol Use: 3.0 oz/week    5 Glasses of wine per week     Comment: 5 a week    ALLERGIES:   has No Known Allergies.  MEDICATIONS:  Current Outpatient Prescriptions  Medication Sig Dispense Refill  . lisinopril (PRINIVIL,ZESTRIL) 5 MG tablet Take 20 mg by mouth daily.     . metFORMIN (GLUMETZA) 1000 MG (MOD) 24 hr tablet Take 1,000 mg by mouth 2 (two) times daily.    Marland Kitchen ibrutinib (IMBRUVICA) 140 MG capsul Take 2 capsules (280 mg total) by mouth daily. 60 capsule 3   No current facility-administered medications for this visit.    PHYSICAL EXAMINATION: ECOG PERFORMANCE STATUS: 0 - Asymptomatic  BP 138/87 mmHg  Pulse 94  Temp(Src) 98.4 F (36.9 C) (Tympanic)  Resp 18  Ht 5\' 8"  (1.727 m)  Wt 208 lb 1.8 oz (94.4 kg)  BMI 31.65 kg/m2  Filed Weights   03/06/15 1134  Weight: 208 lb 1.8 oz (94.4 kg)    GENERAL: Well-nourished well-developed; Alert, no distress and comfortable.   Alone. EYES: no pallor or icterus OROPHARYNX: no thrush or ulceration; good dentition  NECK: supple, no masses felt LYMPH:  no palpable lymphadenopathy in the cervical, axillary or inguinal regions LUNGS: clear to auscultation and  No wheeze or crackles HEART/CVS: regular rate & rhythm and no murmurs; No lower extremity edema ABDOMEN:abdomen soft, non-tender and normal bowel sounds Musculoskeletal:no cyanosis of digits and no clubbing  PSYCH: alert & oriented x 3 with fluent speech NEURO: no focal motor/sensory deficits SKIN:   Multiple skin lesions- improved /scabbed. No  new lesions.  LABORATORY DATA:  I have reviewed the data as listed    Component Value Date/Time   NA 134* 02/20/2015 0956   NA 142 05/03/2014 1139   K 4.7 02/20/2015 0956   K 4.7 05/03/2014 1139   CL 104 02/20/2015 0956   CL 110* 05/03/2014 1139   CO2 23 02/20/2015 0956   CO2 24 05/03/2014 1139   GLUCOSE 118* 02/20/2015 0956   GLUCOSE 98 05/03/2014 1139   BUN 23* 02/20/2015 0956   BUN 20* 05/03/2014 1139   CREATININE 1.21 02/20/2015 0956   CREATININE 1.22 08/09/2014 1122   CALCIUM 8.4* 02/20/2015 0956   CALCIUM 8.6  05/03/2014 1139   PROT 7.8 02/20/2015 0956   PROT 7.5 05/03/2014 1139   ALBUMIN 4.7 02/20/2015 0956   ALBUMIN 4.1 05/03/2014 1139   AST 25 02/20/2015 0956   AST 15 05/03/2014 1139   ALT 18 02/20/2015 0956   ALT 26 05/03/2014 1139   ALKPHOS 84 02/20/2015 0956   ALKPHOS 94 05/03/2014 1139   BILITOT 0.4 02/20/2015 0956   BILITOT 0.5 05/03/2014 1139   GFRNONAA >60 02/20/2015 0956   GFRNONAA >60 08/09/2014 1122   GFRNONAA 55* 05/24/2014 1124   GFRAA >60 02/20/2015 0956   GFRAA >60 08/09/2014 1122   GFRAA >60 05/24/2014 1124    No results found for: SPEP, UPEP  Lab Results  Component Value Date   WBC 9.3 02/20/2015   NEUTROABS 4.1 02/20/2015   HGB 11.4* 02/20/2015   HCT 35.3* 02/20/2015   MCV 97.9 02/20/2015   PLT 175 02/20/2015      Chemistry      Component Value Date/Time   NA 134* 02/20/2015 0956   NA 142 05/03/2014 1139   K 4.7 02/20/2015 0956   K 4.7 05/03/2014 1139   CL 104 02/20/2015 0956   CL 110* 05/03/2014 1139   CO2 23 02/20/2015 0956   CO2 24 05/03/2014 1139   BUN 23* 02/20/2015 0956   BUN 20* 05/03/2014 1139   CREATININE 1.21 02/20/2015 0956   CREATININE 1.22 08/09/2014 1122      Component Value Date/Time   CALCIUM 8.4* 02/20/2015 0956   CALCIUM 8.6 05/03/2014 1139   ALKPHOS 84 02/20/2015 0956   ALKPHOS 94 05/03/2014 1139   AST 25 02/20/2015 0956   AST 15 05/03/2014 1139   ALT 18 02/20/2015 0956   ALT 26 05/03/2014 1139   BILITOT 0.4 02/20/2015 0956   BILITOT 0.5 05/03/2014 1139       RADIOGRAPHIC STUDIES: I have personally reviewed the radiological images as listed and agreed with the findings in the report. No results found.   ASSESSMENT & PLAN:  # CLL/SLL- relapsed currently on second line therapy with ibrutinib September 2016. Patient had prior 11 P deletion;  Interestingly repeat  Fish of the peripheral blood is negative for any 11 P  Or any other deleterious mutations.  CT scan shows-  Few millimeters  Increasing in size of his   Abdominal adenopathy compared to a CAT scan in December 2015.  I reviewed the images myself/ reviewed with the patient detailed.  #  Patient is currently on 1 pill of Ibrutinib because of intolerance.  I recommend starting ibrutinib 2 pills a day.  New prescription given.   # Folliculitis/soft tissue infection- positive for MSSA infection.  Status post clindamycin resolved.   #  Patient follow-up with me in approximately 2 months;  Labs CBC CMP and LDH.   No orders of the defined  types were placed in this encounter.   All questions were answered. The patient knows to call the clinic with any problems, questions or concerns. No barriers to learning was detected.  I spent 25 minutes counseling the patient face to face. The total time spent in the appointment was 30 minutes and more than 50% was on counseling and review of test results     Cammie Sickle, MD 03/06/2015 11:45 AM

## 2015-03-06 NOTE — Progress Notes (Signed)
A little nauseated this am and he did not eat, he usually does not eat. I offered food and drink but pt drank coffee and does not want anything.  Wounds have well healed now

## 2015-03-06 NOTE — Progress Notes (Signed)
rx refill for ibrutinib faxed to [facility].

## 2015-03-06 NOTE — Patient Instructions (Signed)
Patient instructed to restart ibrutinib today. He is to take 2 tablets daily. He will need a new RX.

## 2015-03-17 ENCOUNTER — Encounter: Payer: Self-pay | Admitting: Oncology

## 2015-03-20 ENCOUNTER — Other Ambulatory Visit: Payer: Medicare Other

## 2015-03-20 ENCOUNTER — Ambulatory Visit: Payer: Medicare Other | Admitting: Internal Medicine

## 2015-05-08 ENCOUNTER — Inpatient Hospital Stay: Payer: Medicare Other

## 2015-05-08 ENCOUNTER — Inpatient Hospital Stay: Payer: Medicare Other | Admitting: Internal Medicine

## 2015-05-09 ENCOUNTER — Encounter: Payer: Self-pay | Admitting: *Deleted
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
## 2015-05-19 ENCOUNTER — Encounter: Payer: Self-pay | Admitting: Internal Medicine

## 2015-05-19 ENCOUNTER — Inpatient Hospital Stay (HOSPITAL_BASED_OUTPATIENT_CLINIC_OR_DEPARTMENT_OTHER): Payer: Medicare Other | Admitting: Internal Medicine

## 2015-05-19 ENCOUNTER — Inpatient Hospital Stay: Payer: Medicare Other | Attending: Internal Medicine

## 2015-05-19 VITALS — BP 169/82 | HR 82 | Temp 98.9°F | Resp 18 | Wt 206.8 lb

## 2015-05-19 DIAGNOSIS — C9110 Chronic lymphocytic leukemia of B-cell type not having achieved remission: Secondary | ICD-10-CM

## 2015-05-19 DIAGNOSIS — E119 Type 2 diabetes mellitus without complications: Secondary | ICD-10-CM

## 2015-05-19 DIAGNOSIS — Z7984 Long term (current) use of oral hypoglycemic drugs: Secondary | ICD-10-CM | POA: Insufficient documentation

## 2015-05-19 DIAGNOSIS — Z79899 Other long term (current) drug therapy: Secondary | ICD-10-CM | POA: Insufficient documentation

## 2015-05-19 DIAGNOSIS — F17210 Nicotine dependence, cigarettes, uncomplicated: Secondary | ICD-10-CM

## 2015-05-19 DIAGNOSIS — I10 Essential (primary) hypertension: Secondary | ICD-10-CM | POA: Insufficient documentation

## 2015-05-19 DIAGNOSIS — C9112 Chronic lymphocytic leukemia of B-cell type in relapse: Secondary | ICD-10-CM | POA: Diagnosis not present

## 2015-05-19 DIAGNOSIS — R7989 Other specified abnormal findings of blood chemistry: Secondary | ICD-10-CM | POA: Insufficient documentation

## 2015-05-19 DIAGNOSIS — C9192 Lymphoid leukemia, unspecified, in relapse: Secondary | ICD-10-CM

## 2015-05-19 LAB — CBC WITH DIFFERENTIAL/PLATELET
Basophils Absolute: 0.3 10*3/uL — ABNORMAL HIGH (ref 0–0.1)
Basophils Relative: 3 %
EOS ABS: 0.1 10*3/uL (ref 0–0.7)
EOS PCT: 1 %
HCT: 33.9 % — ABNORMAL LOW (ref 40.0–52.0)
HEMOGLOBIN: 10.9 g/dL — AB (ref 13.0–18.0)
LYMPHS ABS: 5.7 10*3/uL — AB (ref 1.0–3.6)
Lymphocytes Relative: 54 %
MCH: 31.5 pg (ref 26.0–34.0)
MCHC: 32.3 g/dL (ref 32.0–36.0)
MCV: 97.6 fL (ref 80.0–100.0)
MONO ABS: 0.9 10*3/uL (ref 0.2–1.0)
MONOS PCT: 9 %
NEUTROS PCT: 33 %
Neutro Abs: 3.5 10*3/uL (ref 1.4–6.5)
Platelets: 126 10*3/uL — ABNORMAL LOW (ref 150–440)
RBC: 3.47 MIL/uL — ABNORMAL LOW (ref 4.40–5.90)
RDW: 18.3 % — AB (ref 11.5–14.5)
WBC: 10.5 10*3/uL (ref 3.8–10.6)

## 2015-05-19 LAB — COMPREHENSIVE METABOLIC PANEL
ALK PHOS: 70 U/L (ref 38–126)
ALT: 16 U/L — ABNORMAL LOW (ref 17–63)
ANION GAP: 7 (ref 5–15)
AST: 13 U/L — ABNORMAL LOW (ref 15–41)
Albumin: 4.2 g/dL (ref 3.5–5.0)
BUN: 22 mg/dL — ABNORMAL HIGH (ref 6–20)
CALCIUM: 8.8 mg/dL — AB (ref 8.9–10.3)
CO2: 23 mmol/L (ref 22–32)
Chloride: 106 mmol/L (ref 101–111)
Creatinine, Ser: 1.37 mg/dL — ABNORMAL HIGH (ref 0.61–1.24)
GFR calc non Af Amer: 52 mL/min — ABNORMAL LOW (ref >60)
Glucose, Bld: 209 mg/dL — ABNORMAL HIGH (ref 65–99)
Potassium: 4.2 mmol/L (ref 3.5–5.1)
SODIUM: 136 mmol/L (ref 135–145)
TOTAL PROTEIN: 7.3 g/dL (ref 6.5–8.1)
Total Bilirubin: 0.5 mg/dL (ref 0.3–1.2)

## 2015-05-19 LAB — LACTATE DEHYDROGENASE: LDH: 115 U/L (ref 98–192)

## 2015-05-19 NOTE — Progress Notes (Signed)
Patient states he has had nausea on and off for about one year.  [name] performed upper endoscopy but was negative.  No other complaints at this time.

## 2015-05-19 NOTE — Progress Notes (Signed)
[facility] OFFICE PROGRESS NOTE  Patient Care Team: [name], MD as PCP - General (Internal Medicine)   SUMMARY OF ONCOLOGIC HISTORY: # 2006- CLL STAGE IV; MAY 2011- WBC- 57K;Platelets-99;Hb-12/CT Bulky LN; BMBx- 80% Invol; del 11; START Benda-Ritux x4 [finished Sep 2011];   # July 2015-Progression; Sep 2015-START ibrutinib; CT scan DEC 2015- Improvement LN; Cont Ibrutinib 140mg /d; NOV 2016 CT- 1-2CM LN [mild progression compared to Dec 2015];NOV 2016- FISH peripheral blood- NO MUTATIONS/CD-38 Positive;; NOV 7th- CONT IBRUTINIB 2 pills/day  # MSSA skin infection [Oct 2016] s/p clinda  INTERVAL HISTORY:  65 year old male patient with history of relapsed CLL currently on second line therapy ibrutinib since September 2015 for follow-up; patient is currently on 2 pills of ibrutinib [secondary to intolerance].  Appetite is good; he has not lost any weight. No fever no chills. No night sweats. No diarrhea.  No abdominal pain. Denies any new lumps or bumps.    REVIEW OF SYSTEMS:  A complete 10 point review of system is done which is negative except mentioned above/history of present illness.   PAST MEDICAL HISTORY :  Past Medical History  Diagnosis Date  . CLL (chronic lymphocytic leukemia) ([place])   . Hypertension   . Hematuria   . Diabetes mellitus without complication ([place])     PAST SURGICAL HISTORY :   Past Surgical History  Procedure Laterality Date  . Cholecystectomy  1983    FAMILY HISTORY :   Family History  Problem Relation Age of Onset  . Hypertension Sister     SOCIAL HISTORY:   Social History  Substance Use Topics  . Smoking status: Current Every Day Smoker -- 0.50 packs/day for 46 years    Types: Cigarettes  . Smokeless tobacco: Never Used  . Alcohol Use: 3.0 oz/week    5 Glasses of wine per week     Comment: 5 a week    ALLERGIES:  has No Known Allergies.  MEDICATIONS:  Current Outpatient Prescriptions  Medication Sig Dispense  Refill  . ibrutinib (IMBRUVICA) 140 MG capsul Take 2 capsules (280 mg total) by mouth daily. 60 capsule 3  . lisinopril (PRINIVIL,ZESTRIL) 5 MG tablet Take 20 mg by mouth daily.     . metFORMIN (GLUMETZA) 1000 MG (MOD) 24 hr tablet Take 1,000 mg by mouth 2 (two) times daily.    Marland Kitchen omeprazole (PRILOSEC) 20 MG capsule Take by mouth.     No current facility-administered medications for this visit.    PHYSICAL EXAMINATION: ECOG PERFORMANCE STATUS: 0 - Asymptomatic  BP 169/82 mmHg  Pulse 82  Temp(Src) 98.9 F (37.2 C) (Tympanic)  Resp 18  Wt 206 lb 12.7 oz (93.8 kg)  Filed Weights   05/19/15 1116  Weight: 206 lb 12.7 oz (93.8 kg)    GENERAL: Well-nourished well-developed; Alert, no distress and comfortable.   Alone. EYES: no pallor or icterus OROPHARYNX: no thrush or ulceration; good dentition  NECK: supple, no masses felt LYMPH:  no palpable lymphadenopathy in the cervical, axillary or inguinal regions LUNGS: clear to auscultation and  No wheeze or crackles HEART/CVS: regular rate & rhythm and no murmurs; No lower extremity edema ABDOMEN:abdomen soft, non-tender and normal bowel sounds Musculoskeletal:no cyanosis of digits and no clubbing  PSYCH: alert & oriented x 3 with fluent speech NEURO: no focal motor/sensory deficits SKIN:   Multiple skin lesions- improved /scabbed. No new lesions.  LABORATORY DATA:  I have reviewed the data as listed    Component Value Date/Time  NA 136 05/19/2015 1048   NA 142 05/03/2014 1139   K 4.2 05/19/2015 1048   K 4.7 05/03/2014 1139   CL 106 05/19/2015 1048   CL 110* 05/03/2014 1139   CO2 23 05/19/2015 1048   CO2 24 05/03/2014 1139   GLUCOSE 209* 05/19/2015 1048   GLUCOSE 98 05/03/2014 1139   BUN 22* 05/19/2015 1048   BUN 20* 05/03/2014 1139   CREATININE 1.37* 05/19/2015 1048   CREATININE 1.22 08/09/2014 1122   CALCIUM 8.8* 05/19/2015 1048   CALCIUM 8.6 05/03/2014 1139   PROT 7.3 05/19/2015 1048   PROT 7.5 05/03/2014 1139    ALBUMIN 4.2 05/19/2015 1048   ALBUMIN 4.1 05/03/2014 1139   AST 13* 05/19/2015 1048   AST 15 05/03/2014 1139   ALT 16* 05/19/2015 1048   ALT 26 05/03/2014 1139   ALKPHOS 70 05/19/2015 1048   ALKPHOS 94 05/03/2014 1139   BILITOT 0.5 05/19/2015 1048   BILITOT 0.5 05/03/2014 1139   GFRNONAA 52* 05/19/2015 1048   GFRNONAA >60 08/09/2014 1122   GFRNONAA 55* 05/24/2014 1124   GFRAA >60 05/19/2015 1048   GFRAA >60 08/09/2014 1122   GFRAA >60 05/24/2014 1124    No results found for: SPEP, UPEP  Lab Results  Component Value Date   WBC 10.5 05/19/2015   NEUTROABS 3.5 05/19/2015   HGB 10.9* 05/19/2015   HCT 33.9* 05/19/2015   MCV 97.6 05/19/2015   PLT 126* 05/19/2015      Chemistry      Component Value Date/Time   NA 136 05/19/2015 1048   NA 142 05/03/2014 1139   K 4.2 05/19/2015 1048   K 4.7 05/03/2014 1139   CL 106 05/19/2015 1048   CL 110* 05/03/2014 1139   CO2 23 05/19/2015 1048   CO2 24 05/03/2014 1139   BUN 22* 05/19/2015 1048   BUN 20* 05/03/2014 1139   CREATININE 1.37* 05/19/2015 1048   CREATININE 1.22 08/09/2014 1122      Component Value Date/Time   CALCIUM 8.8* 05/19/2015 1048   CALCIUM 8.6 05/03/2014 1139   ALKPHOS 70 05/19/2015 1048   ALKPHOS 94 05/03/2014 1139   AST 13* 05/19/2015 1048   AST 15 05/03/2014 1139   ALT 16* 05/19/2015 1048   ALT 26 05/03/2014 1139   BILITOT 0.5 05/19/2015 1048   BILITOT 0.5 05/03/2014 1139       RADIOGRAPHIC STUDIES: I have personally reviewed the radiological images as listed and agreed with the findings in the report. No results found.   ASSESSMENT & PLAN:  # CLL/SLL- relapsed currently on second line therapy with ibrutinib September 2016. Patient had prior 11 P deletion;  Interestingly repeat Fish of the peripheral blood is negative for any 11 P  Or any other deleterious mutations.  NOV 2016 CT- 1-2CM LN [mild progression compared to Dec 2015]. Patient currently on ibrutinib 2 pills.   Patient clinically doing  well; clinically no suspicion for progression noted at this time. Continue support. 2 pills at this time. Patient's white count is normal hemoglobin slightly low at 10.9 platelets slightly low at 120s. CMP is within normal limits except for mildly elevated creatinine at 1.37 [chronic].  # Patient follow-up with me in  approximately 2 months;  Labs CBC CMP and LDH. Will plan imaging at that visit based upon his clinical status.   I spent 15 minutes counseling the patient face to face. The total time spent in the appointment was 30 minutes and more than 50% was on  counseling and review of test results     Cammie Sickle, MD 05/19/2015 11:26 AM

## 2015-07-17 ENCOUNTER — Inpatient Hospital Stay: Payer: Medicare Other

## 2015-07-17 ENCOUNTER — Encounter: Payer: Self-pay | Admitting: Internal Medicine

## 2015-07-17 ENCOUNTER — Inpatient Hospital Stay: Payer: Medicare Other | Attending: Internal Medicine | Admitting: Internal Medicine

## 2015-07-17 VITALS — BP 152/84 | HR 65 | Temp 97.2°F | Resp 18 | Ht 68.0 in | Wt 210.1 lb

## 2015-07-17 DIAGNOSIS — C9112 Chronic lymphocytic leukemia of B-cell type in relapse: Secondary | ICD-10-CM

## 2015-07-17 DIAGNOSIS — Z79899 Other long term (current) drug therapy: Secondary | ICD-10-CM | POA: Diagnosis not present

## 2015-07-17 DIAGNOSIS — I129 Hypertensive chronic kidney disease with stage 1 through stage 4 chronic kidney disease, or unspecified chronic kidney disease: Secondary | ICD-10-CM | POA: Insufficient documentation

## 2015-07-17 DIAGNOSIS — Z7984 Long term (current) use of oral hypoglycemic drugs: Secondary | ICD-10-CM | POA: Diagnosis not present

## 2015-07-17 DIAGNOSIS — N189 Chronic kidney disease, unspecified: Secondary | ICD-10-CM | POA: Diagnosis not present

## 2015-07-17 DIAGNOSIS — D6489 Other specified anemias: Secondary | ICD-10-CM

## 2015-07-17 DIAGNOSIS — E1122 Type 2 diabetes mellitus with diabetic chronic kidney disease: Secondary | ICD-10-CM | POA: Insufficient documentation

## 2015-07-17 DIAGNOSIS — C9192 Lymphoid leukemia, unspecified, in relapse: Secondary | ICD-10-CM

## 2015-07-17 DIAGNOSIS — F17210 Nicotine dependence, cigarettes, uncomplicated: Secondary | ICD-10-CM

## 2015-07-17 LAB — IRON AND TIBC
IRON: 132 ug/dL (ref 45–182)
Saturation Ratios: 29 % (ref 17.9–39.5)
TIBC: 450 ug/dL (ref 250–450)
UIBC: 318 ug/dL

## 2015-07-17 LAB — COMPREHENSIVE METABOLIC PANEL
ALBUMIN: 4.2 g/dL (ref 3.5–5.0)
ALK PHOS: 66 U/L (ref 38–126)
ALT: 14 U/L — ABNORMAL LOW (ref 17–63)
ANION GAP: 6 (ref 5–15)
AST: 18 U/L (ref 15–41)
BILIRUBIN TOTAL: 0.5 mg/dL (ref 0.3–1.2)
BUN: 21 mg/dL — ABNORMAL HIGH (ref 6–20)
CALCIUM: 8.7 mg/dL — AB (ref 8.9–10.3)
CO2: 24 mmol/L (ref 22–32)
Chloride: 103 mmol/L (ref 101–111)
Creatinine, Ser: 1.49 mg/dL — ABNORMAL HIGH (ref 0.61–1.24)
GFR, EST AFRICAN AMERICAN: 55 mL/min — AB (ref >60)
GFR, EST NON AFRICAN AMERICAN: 47 mL/min — AB (ref >60)
GLUCOSE: 134 mg/dL — AB (ref 65–99)
POTASSIUM: 5 mmol/L (ref 3.5–5.1)
Sodium: 133 mmol/L — ABNORMAL LOW (ref 135–145)
TOTAL PROTEIN: 7.1 g/dL (ref 6.5–8.1)

## 2015-07-17 LAB — CBC WITH DIFFERENTIAL/PLATELET
BASOS PCT: 1 %
Basophils Absolute: 0.1 10*3/uL (ref 0–0.1)
Eosinophils Absolute: 0.2 10*3/uL (ref 0–0.7)
Eosinophils Relative: 2 %
HEMATOCRIT: 32.7 % — AB (ref 40.0–52.0)
HEMOGLOBIN: 10.9 g/dL — AB (ref 13.0–18.0)
LYMPHS ABS: 5.6 10*3/uL — AB (ref 1.0–3.6)
Lymphocytes Relative: 55 %
MCH: 34 pg (ref 26.0–34.0)
MCHC: 33.2 g/dL (ref 32.0–36.0)
MCV: 102.4 fL — ABNORMAL HIGH (ref 80.0–100.0)
MONO ABS: 1 10*3/uL (ref 0.2–1.0)
MONOS PCT: 10 %
NEUTROS ABS: 3.3 10*3/uL (ref 1.4–6.5)
NEUTROS PCT: 32 %
Platelets: 123 10*3/uL — ABNORMAL LOW (ref 150–440)
RBC: 3.2 MIL/uL — ABNORMAL LOW (ref 4.40–5.90)
RDW: 19.8 % — AB (ref 11.5–14.5)
WBC: 10.2 10*3/uL (ref 3.8–10.6)

## 2015-07-17 LAB — FERRITIN: FERRITIN: 64 ng/mL (ref 24–336)

## 2015-07-17 LAB — LACTATE DEHYDROGENASE: LDH: 120 U/L (ref 98–192)

## 2015-07-17 NOTE — Progress Notes (Signed)
[facility] OFFICE PROGRESS NOTE  Patient Care Team: [name], MD as PCP - General (Internal Medicine)   SUMMARY OF ONCOLOGIC HISTORY: # 2006- CLL STAGE IV; MAY 2011- WBC- 57K;Platelets-99;Hb-12/CT Bulky LN; BMBx- 80% Invol; del 11; START Benda-Ritux x4 [finished Sep 2011];   # July 2015-Progression; Sep 2015-START ibrutinib; CT scan DEC 2015- Improvement LN; Cont Ibrutinib '140mg'$ /d; NOV 2016 CT- 1-2CM LN [mild progression compared to Dec 2015];NOV 2016- FISH peripheral blood- NO MUTATIONS/CD-38 Positive;; NOV 7th- CONT IBRUTINIB 2 pills/day  # MSSA skin infection [Oct 2016] s/p clinda  INTERVAL HISTORY:  65 year old male patient with history of relapsed CLL currently on second line therapy ibrutinib since September 2015 for follow-up; patient is currently on 2 pills of ibrutinib [secondary to intolerance].  Patient is not losing any weight. No night sweats. No recent infections or fevers. No nausea no vomiting no early satiety. No new lumps or bumps.  Denies any palpitations or syncopal episodes. No bleeding.  REVIEW OF SYSTEMS:  A complete 10 point review of system is done which is negative except mentioned above/history of present illness.   PAST MEDICAL HISTORY :  Past Medical History  Diagnosis Date  . CLL (chronic lymphocytic leukemia) ([place])   . Hypertension   . Hematuria   . Diabetes mellitus without complication ([place])     PAST SURGICAL HISTORY :   Past Surgical History  Procedure Laterality Date  . Cholecystectomy  1983    FAMILY HISTORY :   Family History  Problem Relation Age of Onset  . Hypertension Sister     SOCIAL HISTORY:   Social History  Substance Use Topics  . Smoking status: Current Every Day Smoker -- 0.50 packs/day for 46 years    Types: Cigarettes  . Smokeless tobacco: Never Used  . Alcohol Use: 3.0 oz/week    5 Glasses of wine per week     Comment: 5 a week    ALLERGIES:  has No Known Allergies.  MEDICATIONS:   Current Outpatient Prescriptions  Medication Sig Dispense Refill  . ibrutinib (IMBRUVICA) 140 MG capsul Take 2 capsules (280 mg total) by mouth daily. 60 capsule 3  . lisinopril (PRINIVIL,ZESTRIL) 5 MG tablet Take 20 mg by mouth daily.     . metFORMIN (GLUMETZA) 1000 MG (MOD) 24 hr tablet Take 1,000 mg by mouth 2 (two) times daily.    Marland Kitchen omeprazole (PRILOSEC) 20 MG capsule Take by mouth.     No current facility-administered medications for this visit.    PHYSICAL EXAMINATION: ECOG PERFORMANCE STATUS: 0 - Asymptomatic  BP 152/84 mmHg  Pulse 65  Temp(Src) 97.2 F (36.2 C) (Tympanic)  Resp 18  Ht '5\' 8"'$  (1.727 m)  Wt 210 lb 1.6 oz (95.3 kg)  BMI 31.95 kg/m2  Filed Weights   07/17/15 1153  Weight: 210 lb 1.6 oz (95.3 kg)    GENERAL: Well-nourished well-developed; Alert, no distress and comfortable.   Alone. EYES: no pallor or icterus OROPHARYNX: no thrush or ulceration; good dentition  NECK: supple, no masses felt LYMPH:  no palpable lymphadenopathy in the cervical, axillary or inguinal regions LUNGS: clear to auscultation and  No wheeze or crackles HEART/CVS: regular rate & rhythm and no murmurs; No lower extremity edema ABDOMEN:abdomen soft, non-tender and normal bowel sounds Musculoskeletal:no cyanosis of digits and no clubbing  PSYCH: alert & oriented x 3 with fluent speech NEURO: no focal motor/sensory deficits SKIN:   Multiple skin lesions- improved /scabbed. No new lesions.  LABORATORY DATA:  I have reviewed the data as listed    Component Value Date/Time   NA 133* 07/17/2015 1035   NA 142 05/03/2014 1139   K 5.0 07/17/2015 1035   K 4.7 05/03/2014 1139   CL 103 07/17/2015 1035   CL 110* 05/03/2014 1139   CO2 24 07/17/2015 1035   CO2 24 05/03/2014 1139   GLUCOSE 134* 07/17/2015 1035   GLUCOSE 98 05/03/2014 1139   BUN 21* 07/17/2015 1035   BUN 20* 05/03/2014 1139   CREATININE 1.49* 07/17/2015 1035   CREATININE 1.22 08/09/2014 1122   CALCIUM 8.7* 07/17/2015  1035   CALCIUM 8.6 05/03/2014 1139   PROT 7.1 07/17/2015 1035   PROT 7.5 05/03/2014 1139   ALBUMIN 4.2 07/17/2015 1035   ALBUMIN 4.1 05/03/2014 1139   AST 18 07/17/2015 1035   AST 15 05/03/2014 1139   ALT 14* 07/17/2015 1035   ALT 26 05/03/2014 1139   ALKPHOS 66 07/17/2015 1035   ALKPHOS 94 05/03/2014 1139   BILITOT 0.5 07/17/2015 1035   BILITOT 0.5 05/03/2014 1139   GFRNONAA 47* 07/17/2015 1035   GFRNONAA >60 08/09/2014 1122   GFRNONAA 55* 05/24/2014 1124   GFRAA 55* 07/17/2015 1035   GFRAA >60 08/09/2014 1122   GFRAA >60 05/24/2014 1124    No results found for: SPEP, UPEP  Lab Results  Component Value Date   WBC 10.2 07/17/2015   NEUTROABS 3.3 07/17/2015   HGB 10.9* 07/17/2015   HCT 32.7* 07/17/2015   MCV 102.4* 07/17/2015   PLT 123* 07/17/2015      Chemistry      Component Value Date/Time   NA 133* 07/17/2015 1035   NA 142 05/03/2014 1139   K 5.0 07/17/2015 1035   K 4.7 05/03/2014 1139   CL 103 07/17/2015 1035   CL 110* 05/03/2014 1139   CO2 24 07/17/2015 1035   CO2 24 05/03/2014 1139   BUN 21* 07/17/2015 1035   BUN 20* 05/03/2014 1139   CREATININE 1.49* 07/17/2015 1035   CREATININE 1.22 08/09/2014 1122      Component Value Date/Time   CALCIUM 8.7* 07/17/2015 1035   CALCIUM 8.6 05/03/2014 1139   ALKPHOS 66 07/17/2015 1035   ALKPHOS 94 05/03/2014 1139   AST 18 07/17/2015 1035   AST 15 05/03/2014 1139   ALT 14* 07/17/2015 1035   ALT 26 05/03/2014 1139   BILITOT 0.5 07/17/2015 1035   BILITOT 0.5 05/03/2014 1139         ASSESSMENT & PLAN:  # CLL/SLL- relapsed currently on second line therapy with ibrutinib September 2016. Patient had prior 11 P deletion;  Interestingly repeat Fish of the peripheral blood is negative for any 11 P  Or any other deleterious mutations.  NOV 2016 CT- 1-2CM LN [mild progression compared to Dec 2015]. Patient currently on ibrutinib 2 pills.   Patient clinically doing well; clinically no suspicion for progression noted at  this time. Continue support. 2 pills at this time. Patient's white count is normal hemoglobin slightly low at 10.9 platelets-123.  Marland Kitchen CMP is within normal limits except for mildly elevated creatinine at 1.49 [chronic].  # Anemia 10.9- question related to chronic kidney disease. Check iron studies today. Recommend by mouth iron once a day. However MCV is elevated; if his anemia continues to get worse I would recommend a bone marrow biopsy for further evaluation.  # Chronic kidney disease creatinine 1.49- recommend increased by mouth fluid intake. Patient agrees.  # Patient follow-up with me in  approximately 2 months;  Labs CBC CMP and LDH; also recommend noncontrast CT scan of the chest abdomen pelvis   # # 25 minutes face-to-face with the patient discussing the above plan of care; more than 50% of time spent on prognosis/ natural history; counseling and coordination.      Cammie Sickle, MD 07/17/2015 12:12 PM

## 2015-07-17 NOTE — Progress Notes (Signed)
Pt her for f/u of CLL. His fatigue is about the same. He has diarrhea about 2 times a week. When he does have the diarrhea it is about 2 times  In the day it happens.He does not take anything for it. He eats and drinks good.

## 2015-09-12 ENCOUNTER — Other Ambulatory Visit: Payer: Self-pay | Admitting: Internal Medicine

## 2015-09-13 ENCOUNTER — Ambulatory Visit
Admission: RE | Admit: 2015-09-13 | Discharge: 2015-09-13 | Disposition: A | Discharge status: Home / Self Care | Payer: Medicare Other | Source: Ambulatory Visit | Attending: Internal Medicine | Admitting: Internal Medicine

## 2015-09-13 DIAGNOSIS — N200 Calculus of kidney: Secondary | ICD-10-CM | POA: Insufficient documentation

## 2015-09-13 DIAGNOSIS — I2510 Atherosclerotic heart disease of native coronary artery without angina pectoris: Secondary | ICD-10-CM | POA: Diagnosis not present

## 2015-09-13 DIAGNOSIS — C91Z2 Other lymphoid leukemia, in relapse: Secondary | ICD-10-CM | POA: Diagnosis present

## 2015-09-13 DIAGNOSIS — N400 Enlarged prostate without lower urinary tract symptoms: Secondary | ICD-10-CM | POA: Diagnosis not present

## 2015-09-13 DIAGNOSIS — D6489 Other specified anemias: Secondary | ICD-10-CM | POA: Diagnosis present

## 2015-09-13 DIAGNOSIS — C9192 Lymphoid leukemia, unspecified, in relapse: Secondary | ICD-10-CM

## 2015-09-13 DIAGNOSIS — C9112 Chronic lymphocytic leukemia of B-cell type in relapse: Secondary | ICD-10-CM

## 2015-09-13 DIAGNOSIS — R590 Localized enlarged lymph nodes: Secondary | ICD-10-CM | POA: Insufficient documentation

## 2015-09-15 ENCOUNTER — Ambulatory Visit: Payer: Medicare Other | Admitting: Internal Medicine

## 2015-09-15 ENCOUNTER — Inpatient Hospital Stay: Payer: Medicare Other | Attending: Internal Medicine

## 2015-09-15 ENCOUNTER — Inpatient Hospital Stay (HOSPITAL_BASED_OUTPATIENT_CLINIC_OR_DEPARTMENT_OTHER): Payer: Medicare Other | Admitting: Internal Medicine

## 2015-09-15 ENCOUNTER — Other Ambulatory Visit: Payer: Medicare Other

## 2015-09-15 VITALS — BP 166/80 | HR 73 | Temp 99.2°F | Resp 18 | Wt 208.6 lb

## 2015-09-15 DIAGNOSIS — D631 Anemia in chronic kidney disease: Secondary | ICD-10-CM | POA: Insufficient documentation

## 2015-09-15 DIAGNOSIS — Z7984 Long term (current) use of oral hypoglycemic drugs: Secondary | ICD-10-CM | POA: Diagnosis not present

## 2015-09-15 DIAGNOSIS — R590 Localized enlarged lymph nodes: Secondary | ICD-10-CM | POA: Diagnosis not present

## 2015-09-15 DIAGNOSIS — Z9221 Personal history of antineoplastic chemotherapy: Secondary | ICD-10-CM

## 2015-09-15 DIAGNOSIS — C9192 Lymphoid leukemia, unspecified, in relapse: Secondary | ICD-10-CM

## 2015-09-15 DIAGNOSIS — N200 Calculus of kidney: Secondary | ICD-10-CM

## 2015-09-15 DIAGNOSIS — I2510 Atherosclerotic heart disease of native coronary artery without angina pectoris: Secondary | ICD-10-CM | POA: Insufficient documentation

## 2015-09-15 DIAGNOSIS — N400 Enlarged prostate without lower urinary tract symptoms: Secondary | ICD-10-CM | POA: Diagnosis not present

## 2015-09-15 DIAGNOSIS — E1122 Type 2 diabetes mellitus with diabetic chronic kidney disease: Secondary | ICD-10-CM | POA: Diagnosis not present

## 2015-09-15 DIAGNOSIS — L989 Disorder of the skin and subcutaneous tissue, unspecified: Secondary | ICD-10-CM | POA: Diagnosis not present

## 2015-09-15 DIAGNOSIS — F17210 Nicotine dependence, cigarettes, uncomplicated: Secondary | ICD-10-CM | POA: Insufficient documentation

## 2015-09-15 DIAGNOSIS — I129 Hypertensive chronic kidney disease with stage 1 through stage 4 chronic kidney disease, or unspecified chronic kidney disease: Secondary | ICD-10-CM | POA: Diagnosis not present

## 2015-09-15 DIAGNOSIS — C9112 Chronic lymphocytic leukemia of B-cell type in relapse: Secondary | ICD-10-CM | POA: Insufficient documentation

## 2015-09-15 DIAGNOSIS — Z79899 Other long term (current) drug therapy: Secondary | ICD-10-CM | POA: Diagnosis not present

## 2015-09-15 LAB — COMPREHENSIVE METABOLIC PANEL
ALBUMIN: 4.1 g/dL (ref 3.5–5.0)
ALT: 12 U/L — AB (ref 17–63)
AST: 15 U/L (ref 15–41)
Alkaline Phosphatase: 79 U/L (ref 38–126)
Anion gap: 8 (ref 5–15)
BILIRUBIN TOTAL: 0.6 mg/dL (ref 0.3–1.2)
BUN: 19 mg/dL (ref 6–20)
CHLORIDE: 110 mmol/L (ref 101–111)
CO2: 23 mmol/L (ref 22–32)
CREATININE: 1.29 mg/dL — AB (ref 0.61–1.24)
Calcium: 8.7 mg/dL — ABNORMAL LOW (ref 8.9–10.3)
GFR calc Af Amer: >60 mL/min (ref >60)
GFR, EST NON AFRICAN AMERICAN: 56 mL/min — AB (ref >60)
GLUCOSE: 115 mg/dL — AB (ref 65–99)
Potassium: 4.8 mmol/L (ref 3.5–5.1)
Sodium: 141 mmol/L (ref 135–145)
TOTAL PROTEIN: 6.7 g/dL (ref 6.5–8.1)

## 2015-09-15 LAB — CBC WITH DIFFERENTIAL/PLATELET
Basophils Absolute: 0 10*3/uL (ref 0–0.1)
Basophils Relative: 0 %
Eosinophils Absolute: 0.1 10*3/uL (ref 0–0.7)
Eosinophils Relative: 1 %
HEMATOCRIT: 32.5 % — AB (ref 40.0–52.0)
Hemoglobin: 10.6 g/dL — ABNORMAL LOW (ref 13.0–18.0)
LYMPHS ABS: 3.5 10*3/uL (ref 1.0–3.6)
LYMPHS PCT: 43 %
MCH: 33.7 pg (ref 26.0–34.0)
MCHC: 32.5 g/dL (ref 32.0–36.0)
MCV: 103.6 fL — AB (ref 80.0–100.0)
Monocytes Absolute: 1.2 10*3/uL — ABNORMAL HIGH (ref 0.2–1.0)
Monocytes Relative: 14 %
NEUTROS PCT: 42 %
Neutro Abs: 3.5 10*3/uL (ref 1.4–6.5)
Platelets: ADEQUATE 10*3/uL (ref 150–440)
RBC: 3.14 MIL/uL — AB (ref 4.40–5.90)
RDW: 17.6 % — ABNORMAL HIGH (ref 11.5–14.5)
WBC: 8.3 10*3/uL (ref 3.8–10.6)

## 2015-09-15 LAB — LACTATE DEHYDROGENASE: LDH: 151 U/L (ref 98–192)

## 2015-09-15 NOTE — Progress Notes (Signed)
Patient recently started [place].  States his appetite comes and goes.  Has had diarrhea a couple of days, however, now resolved.  Did not take imodium.  States overall he is doing okay.

## 2015-09-15 NOTE — Progress Notes (Signed)
[facility] OFFICE PROGRESS NOTE  Patient Care Team: [name], MD as PCP - General (Internal Medicine)   SUMMARY OF ONCOLOGIC HISTORY: # 2006- CLL STAGE IV; MAY 2011- WBC- 57K;Platelets-99;Hb-12/CT Bulky LN; BMBx- 80% Invol; del 11; START Benda-Ritux x4 [finished Sep 2011];   # July 2015-Progression; Sep 2015-START ibrutinib; CT scan DEC 2015- Improvement LN; Cont Ibrutinib 140mg /d; NOV 2016 CT- 1-2CM LN [mild progression compared to Dec 2015];NOV 2016- FISH peripheral blood- NO MUTATIONS/CD-38 Positive; NOV 7th- CONT IBRUTINIB 2 pills/day; CT MAY 2017- Improved  # MSSA skin infection [Oct 2016] s/p clinda  INTERVAL HISTORY:  65 year old male patient with history of relapsed CLL currently on second line therapy ibrutinib since September 2015 for follow-up; patient is currently on 2 pills of ibrutinib [secondary to intolerance]/ review the results of his restaging CAT scan.  No nausea no vomiting no early satiety. No new lumps or bumps. Patient is not losing any weight. No night sweats. No recent infections or fevers. Mild diarrhea maybe once today. Denies any palpitations or syncopal episodes. No bleeding.  REVIEW OF SYSTEMS:  A complete 10 point review of system is done which is negative except mentioned above/history of present illness.   PAST MEDICAL HISTORY :  Past Medical History  Diagnosis Date  . CLL (chronic lymphocytic leukemia) ([place])   . Hypertension   . Hematuria   . Diabetes mellitus without complication ([place])     PAST SURGICAL HISTORY :   Past Surgical History  Procedure Laterality Date  . Cholecystectomy  1983    FAMILY HISTORY :   Family History  Problem Relation Age of Onset  . Hypertension Sister     SOCIAL HISTORY:   Social History  Substance Use Topics  . Smoking status: Current Every Day Smoker -- 0.50 packs/day for 46 years    Types: Cigarettes  . Smokeless tobacco: Never Used  . Alcohol Use: 3.0 oz/week    5 Glasses of wine  per week     Comment: 5 a week    ALLERGIES:  has No Known Allergies.  MEDICATIONS:  Current Outpatient Prescriptions  Medication Sig Dispense Refill  . cyanocobalamin (,VITAMIN B-12,) 1000 MCG/ML injection Inject into the skin.    Marland Kitchen ibrutinib (IMBRUVICA) 140 MG capsul Take 2 capsules (280 mg total) by mouth daily. 60 capsule 3  . lisinopril (PRINIVIL,ZESTRIL) 5 MG tablet Take 20 mg by mouth daily.     . metFORMIN (GLUMETZA) 1000 MG (MOD) 24 hr tablet Take 1,000 mg by mouth 2 (two) times daily.     No current facility-administered medications for this visit.    PHYSICAL EXAMINATION: ECOG PERFORMANCE STATUS: 0 - Asymptomatic  BP 166/80 mmHg  Pulse 73  Temp(Src) 99.2 F (37.3 C) (Tympanic)  Resp 18  Wt 208 lb 8.9 oz (94.6 kg)  Filed Weights   09/15/15 1511  Weight: 208 lb 8.9 oz (94.6 kg)    GENERAL: Well-nourished well-developed; Alert, no distress and comfortable.   Alone. EYES: no pallor or icterus OROPHARYNX: no thrush or ulceration; good dentition  NECK: supple, no masses felt LYMPH:  no palpable lymphadenopathy in the cervical, axillary or inguinal regions LUNGS: clear to auscultation and  No wheeze or crackles HEART/CVS: regular rate & rhythm and no murmurs; No lower extremity edema ABDOMEN:abdomen soft, non-tender and normal bowel sounds Musculoskeletal:no cyanosis of digits and no clubbing  PSYCH: alert & oriented x 3 with fluent speech NEURO: no focal motor/sensory deficits SKIN:   Multiple skin lesions- improved /  scabbed. No new lesions.  LABORATORY DATA:  I have reviewed the data as listed    Component Value Date/Time   NA 141 09/15/2015 1410   NA 142 05/03/2014 1139   K 4.8 09/15/2015 1410   K 4.7 05/03/2014 1139   CL 110 09/15/2015 1410   CL 110* 05/03/2014 1139   CO2 23 09/15/2015 1410   CO2 24 05/03/2014 1139   GLUCOSE 115* 09/15/2015 1410   GLUCOSE 98 05/03/2014 1139   BUN 19 09/15/2015 1410   BUN 20* 05/03/2014 1139   CREATININE 1.29*  09/15/2015 1410   CREATININE 1.22 08/09/2014 1122   CALCIUM 8.7* 09/15/2015 1410   CALCIUM 8.6 05/03/2014 1139   PROT 6.7 09/15/2015 1410   PROT 7.5 05/03/2014 1139   ALBUMIN 4.1 09/15/2015 1410   ALBUMIN 4.1 05/03/2014 1139   AST 15 09/15/2015 1410   AST 15 05/03/2014 1139   ALT 12* 09/15/2015 1410   ALT 26 05/03/2014 1139   ALKPHOS 79 09/15/2015 1410   ALKPHOS 94 05/03/2014 1139   BILITOT 0.6 09/15/2015 1410   BILITOT 0.5 05/03/2014 1139   GFRNONAA 56* 09/15/2015 1410   GFRNONAA >60 08/09/2014 1122   GFRNONAA 55* 05/24/2014 1124   GFRAA >60 09/15/2015 1410   GFRAA >60 08/09/2014 1122   GFRAA >60 05/24/2014 1124    No results found for: SPEP, UPEP  Lab Results  Component Value Date   WBC 8.3 09/15/2015   NEUTROABS 3.5 09/15/2015   HGB 10.6* 09/15/2015   HCT 32.5* 09/15/2015   MCV 103.6* 09/15/2015   PLT  09/15/2015    PLATELET CLUMPS NOTED ON SMEAR, COUNT APPEARS ADEQUATE      Chemistry      Component Value Date/Time   NA 141 09/15/2015 1410   NA 142 05/03/2014 1139   K 4.8 09/15/2015 1410   K 4.7 05/03/2014 1139   CL 110 09/15/2015 1410   CL 110* 05/03/2014 1139   CO2 23 09/15/2015 1410   CO2 24 05/03/2014 1139   BUN 19 09/15/2015 1410   BUN 20* 05/03/2014 1139   CREATININE 1.29* 09/15/2015 1410   CREATININE 1.22 08/09/2014 1122      Component Value Date/Time   CALCIUM 8.7* 09/15/2015 1410   CALCIUM 8.6 05/03/2014 1139   ALKPHOS 79 09/15/2015 1410   ALKPHOS 94 05/03/2014 1139   AST 15 09/15/2015 1410   AST 15 05/03/2014 1139   ALT 12* 09/15/2015 1410   ALT 26 05/03/2014 1139   BILITOT 0.6 09/15/2015 1410   BILITOT 0.5 05/03/2014 1139      EXAM: CT CHEST, ABDOMEN AND PELVIS WITHOUT CONTRAST  TECHNIQUE: Multidetector CT imaging of the chest, abdomen and pelvis was performed following the standard protocol without IV contrast.  COMPARISON: 03/02/2015.  FINDINGS: CT CHEST FINDINGS  Mediastinum/Lymph Nodes: Mediastinal and axillary  lymph nodes are not enlarged by CT size criteria. Specifically, low right paratracheal lymph node measures 6 mm, previously 10 mm. Right axillary lymph node measures 9 mm, previously 10 mm. Hilar regions are difficult to definitively evaluate without IV contrast. Three-vessel coronary artery calcification. Heart size normal. No pericardial effusion.  Lungs/Pleura: Minimal scattered pulmonary parenchymal scarring. Lungs are otherwise clear. No pleural fluid. Airway is unremarkable.  Musculoskeletal: No worrisome lytic or sclerotic lesions.  CT ABDOMEN PELVIS FINDINGS  Hepatobiliary: Liver is unremarkable. Cholecystectomy. No biliary ductal dilatation.  Pancreas: Negative.  Spleen: Negative, normal in size.  Adrenals/Urinary Tract: Adrenal glands are unremarkable. Low-attenuation lesions in the kidneys measure up to  3.4 cm on the left and are likely cysts although definitive characterization is limited without post-contrast imaging. Punctate left renal stone. Ureters are decompressed. Bladder is unremarkable.  Stomach/Bowel: Stomach, small bowel, appendix and colon are unremarkable.  Vascular/Lymphatic: Atherosclerotic calcification of the arterial vasculature without abdominal aortic aneurysm. Porta hepatis lymph node measures 1.7 cm, previously 2.5 cm. Retroperitoneal lymph node is measure up to 1.7 cm in the left periaortic station, stable.  Reproductive: Prostate is enlarged.  Other: No free fluid. Mesenteries and peritoneum are unremarkable.  Musculoskeletal: No worrisome lytic or sclerotic lesions.  IMPRESSION: 1. Mediastinal and porta hepatis adenopathy appears slightly improved. Retroperitoneal adenopathy appears grossly stable. 2. Three-vessel coronary artery calcification. 3. Punctate left renal stone. 4. Enlarged prostate.   ASSESSMENT & PLAN:  # CLL/SLL- relapsed currently on second line therapy with ibrutinib September 2016. Patient had  prior 11 P deletion;  Interestingly repeat Fish of the peripheral blood is negative for any 11 P  Or any other deleterious mutations. . CT scan in May 2017 -CT- C/A/P- improving- sub-centimeter lymph nodes mediastinal axillary; 1.7 cm lymph node in the retroperitoneal region stable. Overall improved. Patient currently on ibrutinib 2 pills.  #  Patient clinically doing well; Continue support. 2 pills at this time. Patient's white count is normal hemoglobin slightly low at 10.6 platelets- clumped.   # Anemia 10.6- question related to chronic kidney disease. Iron studies adequate in March 2017 saturation 29%.  # Chronic kidney disease creatinine 1.29/improved.  # Patient follow-up with me in  approximately 3 months;  Labs CBC CMP and LDH;  # # 25 minutes face-to-face with the patient discussing the above plan of care; more than 50% of time spent on prognosis/ natural history; counseling and coordination.      Cammie Sickle, MD 09/15/2015 3:23 PM

## 2015-11-17 ENCOUNTER — Telehealth: Payer: Self-pay | Admitting: *Deleted

## 2015-11-17 NOTE — Telephone Encounter (Signed)
[employer] needs last office note faxed to 1 [phone].  [employer] will help initiate prior auth on Imbruvica.

## 2015-11-17 NOTE — Telephone Encounter (Signed)
Faxed progress note 11-17-15.

## 2015-11-17 NOTE — Telephone Encounter (Signed)
Faxed Progress Note to [place] at [employer] regarding [place].

## 2015-11-20 ENCOUNTER — Telehealth: Payer: Self-pay | Admitting: *Deleted

## 2015-11-20 NOTE — Telephone Encounter (Signed)
Patient's copay for medication is $2,733.43.  Does patient need financial assistance or help with getting free drug from [employer].  Also received call from [facility] that drug was approved.  Left message with Edwena Blow to call back to advise which call do I need to address.

## 2015-11-20 NOTE — Telephone Encounter (Signed)
Kate Sable has been approved for one year, effective today. Letter to follow.

## 2015-12-15 ENCOUNTER — Inpatient Hospital Stay: Payer: Medicare Other | Attending: Internal Medicine

## 2015-12-15 ENCOUNTER — Inpatient Hospital Stay (HOSPITAL_BASED_OUTPATIENT_CLINIC_OR_DEPARTMENT_OTHER): Payer: Medicare Other | Admitting: Internal Medicine

## 2015-12-15 VITALS — BP 178/83 | HR 73 | Temp 98.4°F | Resp 18 | Wt 206.5 lb

## 2015-12-15 DIAGNOSIS — F17210 Nicotine dependence, cigarettes, uncomplicated: Secondary | ICD-10-CM

## 2015-12-15 DIAGNOSIS — C9112 Chronic lymphocytic leukemia of B-cell type in relapse: Secondary | ICD-10-CM

## 2015-12-15 DIAGNOSIS — Z7984 Long term (current) use of oral hypoglycemic drugs: Secondary | ICD-10-CM | POA: Insufficient documentation

## 2015-12-15 DIAGNOSIS — I129 Hypertensive chronic kidney disease with stage 1 through stage 4 chronic kidney disease, or unspecified chronic kidney disease: Secondary | ICD-10-CM | POA: Diagnosis not present

## 2015-12-15 DIAGNOSIS — Z79899 Other long term (current) drug therapy: Secondary | ICD-10-CM | POA: Insufficient documentation

## 2015-12-15 DIAGNOSIS — Z9221 Personal history of antineoplastic chemotherapy: Secondary | ICD-10-CM | POA: Diagnosis not present

## 2015-12-15 DIAGNOSIS — R1084 Generalized abdominal pain: Secondary | ICD-10-CM | POA: Insufficient documentation

## 2015-12-15 DIAGNOSIS — N189 Chronic kidney disease, unspecified: Secondary | ICD-10-CM | POA: Diagnosis not present

## 2015-12-15 DIAGNOSIS — R1013 Epigastric pain: Secondary | ICD-10-CM

## 2015-12-15 DIAGNOSIS — R197 Diarrhea, unspecified: Secondary | ICD-10-CM | POA: Diagnosis not present

## 2015-12-15 DIAGNOSIS — R634 Abnormal weight loss: Secondary | ICD-10-CM | POA: Diagnosis not present

## 2015-12-15 DIAGNOSIS — D649 Anemia, unspecified: Secondary | ICD-10-CM | POA: Diagnosis not present

## 2015-12-15 DIAGNOSIS — C9192 Lymphoid leukemia, unspecified, in relapse: Secondary | ICD-10-CM

## 2015-12-15 DIAGNOSIS — E1122 Type 2 diabetes mellitus with diabetic chronic kidney disease: Secondary | ICD-10-CM

## 2015-12-15 LAB — COMPREHENSIVE METABOLIC PANEL
ALBUMIN: 4.5 g/dL (ref 3.5–5.0)
ALK PHOS: 73 U/L (ref 38–126)
ALT: 15 U/L — AB (ref 17–63)
ANION GAP: 6 (ref 5–15)
AST: 17 U/L (ref 15–41)
BUN: 16 mg/dL (ref 6–20)
CALCIUM: 8.9 mg/dL (ref 8.9–10.3)
CO2: 24 mmol/L (ref 22–32)
CREATININE: 1.23 mg/dL (ref 0.61–1.24)
Chloride: 106 mmol/L (ref 101–111)
GFR calc Af Amer: >60 mL/min (ref >60)
GFR calc non Af Amer: 59 mL/min — ABNORMAL LOW (ref >60)
GLUCOSE: 136 mg/dL — AB (ref 65–99)
Potassium: 4.5 mmol/L (ref 3.5–5.1)
SODIUM: 136 mmol/L (ref 135–145)
Total Bilirubin: 0.8 mg/dL (ref 0.3–1.2)
Total Protein: 7.4 g/dL (ref 6.5–8.1)

## 2015-12-15 LAB — CBC WITH DIFFERENTIAL/PLATELET
BASOS PCT: 0 %
Basophils Absolute: 0 10*3/uL (ref 0–0.1)
EOS PCT: 1 %
Eosinophils Absolute: 0.1 10*3/uL (ref 0–0.7)
HEMATOCRIT: 36.7 % — AB (ref 40.0–52.0)
Hemoglobin: 11.9 g/dL — ABNORMAL LOW (ref 13.0–18.0)
Lymphocytes Relative: 29 %
Lymphs Abs: 2.5 10*3/uL (ref 1.0–3.6)
MCH: 32.9 pg (ref 26.0–34.0)
MCHC: 32.5 g/dL (ref 32.0–36.0)
MCV: 101.3 fL — ABNORMAL HIGH (ref 80.0–100.0)
MONO ABS: 1 10*3/uL (ref 0.2–1.0)
MONOS PCT: 11 %
NEUTROS ABS: 5.1 10*3/uL (ref 1.4–6.5)
Neutrophils Relative %: 59 %
Platelets: 114 10*3/uL — ABNORMAL LOW (ref 150–440)
RBC: 3.62 MIL/uL — ABNORMAL LOW (ref 4.40–5.90)
RDW: 17.8 % — AB (ref 11.5–14.5)
WBC: 8.7 10*3/uL (ref 3.8–10.6)

## 2015-12-15 LAB — LACTATE DEHYDROGENASE: LDH: 126 U/L (ref 98–192)

## 2015-12-15 NOTE — Assessment & Plan Note (Addendum)
#   CLL/SLL- relapsed currently on second line therapy with ibrutinib September 2016. CT scan in May 2017 -CT- C/A/P- improving- sub-centimeter lymph nodes mediastinal axillary; 1.7 cm lymph node in the retroperitoneal region stable. Overall improved. Patient currently on ibrutinib 2 pills.  # Abdominal pain/cramping- CT scan asap.   # Anemia 11.9 question related to chronic kidney disease. Iron studies adequate in March 2017 saturation 29%.  # Chronic kidney disease creatinine 1.3/stable.  # follow up post CT scan ~ 1week/ no labs.

## 2015-12-15 NOTE — Progress Notes (Signed)
Patient states he is having stomach cramping almost constantly.  Appetite is decreased.  Not sleeping well due to the pain. No nausea and no change in bowel habits. BP 178/83.

## 2015-12-15 NOTE — Progress Notes (Signed)
[facility] OFFICE PROGRESS NOTE  Patient Care Team: [name], MD as PCP - General (Internal Medicine)   SUMMARY OF ONCOLOGIC HISTORY: Oncology History   # 2006- CLL STAGE IV; MAY 2011- WBC- 57K;Platelets-99;Hb-12/CT Bulky LN; BMBx- 80% Invol; del 11; START Benda-Ritux x4 [finished Sep 2011];   # July 2015-Progression; Sep 2015-START ibrutinib; CT scan DEC 2015- Improvement LN; Cont Ibrutinib 140mg /d; NOV 2016 CT- 1-2CM LN [mild progression compared to Dec 2015];NOV 2016- FISH peripheral blood- NO MUTATIONS/CD-38 Positive; NOV 7th- CONT IBRUTINIB 2 pills/day; CT MAY 2017- Improved  # MSSA skin infection [Oct 2016] s/p clinda     CLL (chronic lymphoid leukemia) in relapse ([place])   02/20/2015 Initial Diagnosis    CLL (chronic lymphoid leukemia) in relapse [facility])       INTERVAL HISTORY:  65 year old male patient with history of relapsed CLL currently on second line therapy ibrutinib since September 2015 for follow-up; patient is currently on 2 pills of ibrutinib [secondary to intolerance]- is here for follow-up.  Patient noted to have increasing abdominal pain in the last 2 weeks. Epigastric. Denies any reflux-type symptoms. Appetite is fair. Mild weight loss.   No night sweats. No recent infections or fevers. Mild diarrhea maybe once today. Denies any palpitations or syncopal episodes. No bleeding.  REVIEW OF SYSTEMS:  A complete 10 point review of system is done which is negative except mentioned above/history of present illness.   PAST MEDICAL HISTORY :  Past Medical History:  Diagnosis Date  . CLL (chronic lymphocytic leukemia) ([place])   . Diabetes mellitus without complication ([place])   . Hematuria   . Hypertension     PAST SURGICAL HISTORY :   Past Surgical History:  Procedure Laterality Date  . CHOLECYSTECTOMY  1983    FAMILY HISTORY :   Family History  Problem Relation Age of Onset  . Hypertension Sister     SOCIAL HISTORY:   Social History   Substance Use Topics  . Smoking status: Current Every Day Smoker    Packs/day: 0.50    Years: 46.00    Types: Cigarettes  . Smokeless tobacco: Never Used  . Alcohol use 3.0 oz/week    5 Glasses of wine per week     Comment: 5 a week    ALLERGIES:  has No Known Allergies.  MEDICATIONS:  Current Outpatient Prescriptions  Medication Sig Dispense Refill  . cyanocobalamin (,VITAMIN B-12,) 1000 MCG/ML injection Inject into the skin.    Marland Kitchen ibrutinib (IMBRUVICA) 140 MG capsul Take 2 capsules (280 mg total) by mouth daily. 60 capsule 3  . lisinopril (PRINIVIL,ZESTRIL) 5 MG tablet Take 20 mg by mouth daily.     . metFORMIN (GLUMETZA) 1000 MG (MOD) 24 hr tablet Take 1,000 mg by mouth 2 (two) times daily.     No current facility-administered medications for this visit.     PHYSICAL EXAMINATION: ECOG PERFORMANCE STATUS: 0 - Asymptomatic  BP (!) 178/83 (BP Location: Left Arm, Patient Position: Sitting)   Pulse 73   Temp 98.4 F (36.9 C) (Tympanic)   Resp 18   Wt 206 lb 8 oz (93.7 kg)   BMI 31.40 kg/m   Filed Weights   12/15/15 1444  Weight: 206 lb 8 oz (93.7 kg)    GENERAL: Well-nourished well-developed; Alert, no distress and comfortable.   Alone. EYES: no pallor or icterus OROPHARYNX: no thrush or ulceration; good dentition  NECK: supple, no masses felt LYMPH:  no palpable lymphadenopathy in the cervical, axillary or  inguinal regions LUNGS: clear to auscultation and  No wheeze or crackles HEART/CVS: regular rate & rhythm and no murmurs; No lower extremity edema ABDOMEN:abdomen soft, non-tender and normal bowel sounds ? Epigastric mass/ mildly tender.  Musculoskeletal:no cyanosis of digits and no clubbing  PSYCH: alert & oriented x 3 with fluent speech NEURO: no focal motor/sensory deficits SKIN:   No rash.  LABORATORY DATA:  I have reviewed the data as listed    Component Value Date/Time   NA 136 12/15/2015 1416   NA 142 05/03/2014 1139   K 4.5 12/15/2015 1416   K 4.7  05/03/2014 1139   CL 106 12/15/2015 1416   CL 110 (H) 05/03/2014 1139   CO2 24 12/15/2015 1416   CO2 24 05/03/2014 1139   GLUCOSE 136 (H) 12/15/2015 1416   GLUCOSE 98 05/03/2014 1139   BUN 16 12/15/2015 1416   BUN 20 (H) 05/03/2014 1139   CREATININE 1.23 12/15/2015 1416   CREATININE 1.22 08/09/2014 1122   CALCIUM 8.9 12/15/2015 1416   CALCIUM 8.6 05/03/2014 1139   PROT 7.4 12/15/2015 1416   PROT 7.5 05/03/2014 1139   ALBUMIN 4.5 12/15/2015 1416   ALBUMIN 4.1 05/03/2014 1139   AST 17 12/15/2015 1416   AST 15 05/03/2014 1139   ALT 15 (L) 12/15/2015 1416   ALT 26 05/03/2014 1139   ALKPHOS 73 12/15/2015 1416   ALKPHOS 94 05/03/2014 1139   BILITOT 0.8 12/15/2015 1416   BILITOT 0.5 05/03/2014 1139   GFRNONAA 59 (L) 12/15/2015 1416   GFRNONAA >60 08/09/2014 1122   GFRAA >60 12/15/2015 1416   GFRAA >60 08/09/2014 1122    No results found for: SPEP, UPEP  Lab Results  Component Value Date   WBC 8.7 12/15/2015   NEUTROABS 5.1 12/15/2015   HGB 11.9 (L) 12/15/2015   HCT 36.7 (L) 12/15/2015   MCV 101.3 (H) 12/15/2015   PLT 114 (L) 12/15/2015      Chemistry      Component Value Date/Time   NA 136 12/15/2015 1416   NA 142 05/03/2014 1139   K 4.5 12/15/2015 1416   K 4.7 05/03/2014 1139   CL 106 12/15/2015 1416   CL 110 (H) 05/03/2014 1139   CO2 24 12/15/2015 1416   CO2 24 05/03/2014 1139   BUN 16 12/15/2015 1416   BUN 20 (H) 05/03/2014 1139   CREATININE 1.23 12/15/2015 1416   CREATININE 1.22 08/09/2014 1122      Component Value Date/Time   CALCIUM 8.9 12/15/2015 1416   CALCIUM 8.6 05/03/2014 1139   ALKPHOS 73 12/15/2015 1416   ALKPHOS 94 05/03/2014 1139   AST 17 12/15/2015 1416   AST 15 05/03/2014 1139   ALT 15 (L) 12/15/2015 1416   ALT 26 05/03/2014 1139   BILITOT 0.8 12/15/2015 1416   BILITOT 0.5 05/03/2014 1139         ASSESSMENT & PLAN:  CLL (chronic lymphoid leukemia) in relapse ([place]) # CLL/SLL- relapsed currently on second line therapy with  ibrutinib September 2016. CT scan in May 2017 -CT- C/A/P- improving- sub-centimeter lymph nodes mediastinal axillary; 1.7 cm lymph node in the retroperitoneal region stable. Overall improved. Patient currently on ibrutinib 2 pills.  # Abdominal pain/cramping- CT scan asap.   # Anemia 11.9 question related to chronic kidney disease. Iron studies adequate in March 2017 saturation 29%.  # Chronic kidney disease creatinine 1.3/stable.  # follow up post CT scan ~ 1week/ no labs.  Cammie Sickle, MD 12/15/2015 4:54 PM

## 2015-12-19 ENCOUNTER — Ambulatory Visit
Admission: RE | Admit: 2015-12-19 | Discharge: 2015-12-19 | Disposition: A | Discharge status: Home / Self Care | Payer: Medicare Other | Source: Ambulatory Visit | Attending: Internal Medicine | Admitting: Internal Medicine

## 2015-12-19 DIAGNOSIS — R1084 Generalized abdominal pain: Secondary | ICD-10-CM | POA: Diagnosis not present

## 2015-12-19 DIAGNOSIS — I700 Atherosclerosis of aorta: Secondary | ICD-10-CM | POA: Insufficient documentation

## 2015-12-19 DIAGNOSIS — C9112 Chronic lymphocytic leukemia of B-cell type in relapse: Secondary | ICD-10-CM

## 2015-12-19 DIAGNOSIS — C91Z2 Other lymphoid leukemia, in relapse: Secondary | ICD-10-CM | POA: Diagnosis present

## 2015-12-19 DIAGNOSIS — C9192 Lymphoid leukemia, unspecified, in relapse: Secondary | ICD-10-CM

## 2015-12-20 ENCOUNTER — Inpatient Hospital Stay (HOSPITAL_BASED_OUTPATIENT_CLINIC_OR_DEPARTMENT_OTHER): Payer: Medicare Other | Admitting: Internal Medicine

## 2015-12-20 ENCOUNTER — Encounter: Payer: Self-pay | Admitting: Internal Medicine

## 2015-12-20 VITALS — BP 189/99 | HR 64 | Temp 97.9°F | Resp 16 | Ht 68.0 in | Wt 203.8 lb

## 2015-12-20 DIAGNOSIS — C9192 Lymphoid leukemia, unspecified, in relapse: Secondary | ICD-10-CM

## 2015-12-20 DIAGNOSIS — R197 Diarrhea, unspecified: Secondary | ICD-10-CM

## 2015-12-20 DIAGNOSIS — Z7984 Long term (current) use of oral hypoglycemic drugs: Secondary | ICD-10-CM

## 2015-12-20 DIAGNOSIS — C9112 Chronic lymphocytic leukemia of B-cell type in relapse: Secondary | ICD-10-CM | POA: Diagnosis not present

## 2015-12-20 DIAGNOSIS — I129 Hypertensive chronic kidney disease with stage 1 through stage 4 chronic kidney disease, or unspecified chronic kidney disease: Secondary | ICD-10-CM

## 2015-12-20 DIAGNOSIS — D649 Anemia, unspecified: Secondary | ICD-10-CM | POA: Diagnosis not present

## 2015-12-20 DIAGNOSIS — F17210 Nicotine dependence, cigarettes, uncomplicated: Secondary | ICD-10-CM

## 2015-12-20 DIAGNOSIS — Z9221 Personal history of antineoplastic chemotherapy: Secondary | ICD-10-CM | POA: Diagnosis not present

## 2015-12-20 DIAGNOSIS — N189 Chronic kidney disease, unspecified: Secondary | ICD-10-CM

## 2015-12-20 DIAGNOSIS — Z79899 Other long term (current) drug therapy: Secondary | ICD-10-CM

## 2015-12-20 DIAGNOSIS — E1122 Type 2 diabetes mellitus with diabetic chronic kidney disease: Secondary | ICD-10-CM

## 2015-12-20 NOTE — Assessment & Plan Note (Signed)
#   CLL/SLL- relapsed currently on second line therapy with ibrutinib September 2016. CT scan in May 2017 -CT- C/A/P- improving- sub-centimeter lymph nodes mediastinal axillary; 1.7 cm lymph node in the retroperitoneal region stable. CT AUG 22nd 2017- A/P- NED.   # continue Patient currently on ibrutinib 2 pills.  # Abdominal pain/cramping-resolved- CT- no acute findings.   # Anemia 11.9 question related to chronic kidney disease. Iron studies adequate in March 2017 saturation 29%;  Chronic kidney disease creatinine 1.3/stable. Monitor for now.   # follow up in 3 months/ labs.

## 2015-12-20 NOTE — Progress Notes (Signed)
Here for CT results. ?

## 2015-12-20 NOTE — Progress Notes (Signed)
[facility] OFFICE PROGRESS NOTE  Patient Care Team: [name], MD as PCP - General (Internal Medicine)   SUMMARY OF ONCOLOGIC HISTORY: Oncology History   # 2006- CLL STAGE IV; MAY 2011- WBC- 57K;Platelets-99;Hb-12/CT Bulky LN; BMBx- 80% Invol; del 11; START Benda-Ritux x4 [finished Sep 2011];   # July 2015-Progression; Sep 2015-START ibrutinib; CT scan DEC 2015- Improvement LN; Cont Ibrutinib 140mg /d; NOV 2016 CT- 1-2CM LN [mild progression compared to Dec 2015];NOV 2016- FISH peripheral blood- NO MUTATIONS/CD-38 Positive; NOV 7th- CONT IBRUTINIB 2 pills/day; CT MAY 2017- Improved  # MSSA skin infection [Oct 2016] s/p clinda     CLL (chronic lymphoid leukemia) in relapse ([place])   02/20/2015 Initial Diagnosis    CLL (chronic lymphoid leukemia) in relapse [facility])       INTERVAL HISTORY:  65 year old male patient with history of relapsed CLL currently on second line therapy ibrutinib since September 2015 for follow-up; patient is currently on 2 pills of ibrutinib [secondary to intolerance]- is here for follow-up/To review the results of his CAT scan done for abdominal pain.  Abdominal pain since resolved.  Appetite is fair. Mild weight loss. No night sweats. No recent infections or fevers. Mild diarrhea maybe once today. Denies any palpitations or syncopal episodes. No bleeding.  REVIEW OF SYSTEMS:  A complete 10 point review of system is done which is negative except mentioned above/history of present illness.   PAST MEDICAL HISTORY :  Past Medical History:  Diagnosis Date  . CLL (chronic lymphocytic leukemia) ([place])   . Diabetes mellitus without complication ([place])   . Hematuria   . Hypertension     PAST SURGICAL HISTORY :   Past Surgical History:  Procedure Laterality Date  . CHOLECYSTECTOMY  1983    FAMILY HISTORY :   Family History  Problem Relation Age of Onset  . Hypertension Sister     SOCIAL HISTORY:   Social History  Substance Use Topics   . Smoking status: Current Every Day Smoker    Packs/day: 0.50    Years: 46.00    Types: Cigarettes  . Smokeless tobacco: Never Used  . Alcohol use 3.0 oz/week    5 Glasses of wine per week     Comment: 5 a week    ALLERGIES:  has No Known Allergies.  MEDICATIONS:  Current Outpatient Prescriptions  Medication Sig Dispense Refill  . cyanocobalamin (,VITAMIN B-12,) 1000 MCG/ML injection Inject into the skin.    Marland Kitchen ibrutinib (IMBRUVICA) 140 MG capsul Take 2 capsules (280 mg total) by mouth daily. 60 capsule 3  . lisinopril (PRINIVIL,ZESTRIL) 5 MG tablet Take 20 mg by mouth daily.     . metFORMIN (GLUMETZA) 1000 MG (MOD) 24 hr tablet Take 1,000 mg by mouth 2 (two) times daily.     No current facility-administered medications for this visit.     PHYSICAL EXAMINATION: ECOG PERFORMANCE STATUS: 0 - Asymptomatic  BP (!) 189/99 (BP Location: Left Arm, Patient Position: Sitting)   Pulse 64   Temp 97.9 F (36.6 C) (Tympanic)   Resp 16   Ht 5\' 8"  (1.727 m)   Wt 203 lb 12.8 oz (92.4 kg)   BMI 30.99 kg/m   Filed Weights   12/20/15 1434  Weight: 203 lb 12.8 oz (92.4 kg)    GENERAL: Well-nourished well-developed; Alert, no distress and comfortable.   Alone. EYES: no pallor or icterus OROPHARYNX: no thrush or ulceration; good dentition  NECK: supple, no masses felt LYMPH:  no palpable lymphadenopathy in  the cervical, axillary or inguinal regions LUNGS: clear to auscultation and  No wheeze or crackles HEART/CVS: regular rate & rhythm and no murmurs; No lower extremity edema ABDOMEN:abdomen soft, non-tender and normal bowel sounds.  Musculoskeletal:no cyanosis of digits and no clubbing  PSYCH: alert & oriented x 3 with fluent speech NEURO: no focal motor/sensory deficits SKIN:   No rash.  LABORATORY DATA:  I have reviewed the data as listed    Component Value Date/Time   NA 136 12/15/2015 1416   NA 142 05/03/2014 1139   K 4.5 12/15/2015 1416   K 4.7 05/03/2014 1139   CL 106  12/15/2015 1416   CL 110 (H) 05/03/2014 1139   CO2 24 12/15/2015 1416   CO2 24 05/03/2014 1139   GLUCOSE 136 (H) 12/15/2015 1416   GLUCOSE 98 05/03/2014 1139   BUN 16 12/15/2015 1416   BUN 20 (H) 05/03/2014 1139   CREATININE 1.23 12/15/2015 1416   CREATININE 1.22 08/09/2014 1122   CALCIUM 8.9 12/15/2015 1416   CALCIUM 8.6 05/03/2014 1139   PROT 7.4 12/15/2015 1416   PROT 7.5 05/03/2014 1139   ALBUMIN 4.5 12/15/2015 1416   ALBUMIN 4.1 05/03/2014 1139   AST 17 12/15/2015 1416   AST 15 05/03/2014 1139   ALT 15 (L) 12/15/2015 1416   ALT 26 05/03/2014 1139   ALKPHOS 73 12/15/2015 1416   ALKPHOS 94 05/03/2014 1139   BILITOT 0.8 12/15/2015 1416   BILITOT 0.5 05/03/2014 1139   GFRNONAA 59 (L) 12/15/2015 1416   GFRNONAA >60 08/09/2014 1122   GFRAA >60 12/15/2015 1416   GFRAA >60 08/09/2014 1122    No results found for: SPEP, UPEP  Lab Results  Component Value Date   WBC 8.7 12/15/2015   NEUTROABS 5.1 12/15/2015   HGB 11.9 (L) 12/15/2015   HCT 36.7 (L) 12/15/2015   MCV 101.3 (H) 12/15/2015   PLT 114 (L) 12/15/2015      Chemistry      Component Value Date/Time   NA 136 12/15/2015 1416   NA 142 05/03/2014 1139   K 4.5 12/15/2015 1416   K 4.7 05/03/2014 1139   CL 106 12/15/2015 1416   CL 110 (H) 05/03/2014 1139   CO2 24 12/15/2015 1416   CO2 24 05/03/2014 1139   BUN 16 12/15/2015 1416   BUN 20 (H) 05/03/2014 1139   CREATININE 1.23 12/15/2015 1416   CREATININE 1.22 08/09/2014 1122      Component Value Date/Time   CALCIUM 8.9 12/15/2015 1416   CALCIUM 8.6 05/03/2014 1139   ALKPHOS 73 12/15/2015 1416   ALKPHOS 94 05/03/2014 1139   AST 17 12/15/2015 1416   AST 15 05/03/2014 1139   ALT 15 (L) 12/15/2015 1416   ALT 26 05/03/2014 1139   BILITOT 0.8 12/15/2015 1416   BILITOT 0.5 05/03/2014 1139         ASSESSMENT & PLAN:  CLL (chronic lymphoid leukemia) in relapse ([place]) # CLL/SLL- relapsed currently on second line therapy with ibrutinib September 2016. CT scan  in May 2017 -CT- C/A/P- improving- sub-centimeter lymph nodes mediastinal axillary; 1.7 cm lymph node in the retroperitoneal region stable. CT AUG 22nd 2017- A/P- NED.   # continue Patient currently on ibrutinib 2 pills.  # Abdominal pain/cramping-resolved- CT- no acute findings.   # Anemia 11.9 question related to chronic kidney disease. Iron studies adequate in March 2017 saturation 29%;  Chronic kidney disease creatinine 1.3/stable. Monitor for now.   # follow up in 3 months/ labs.  Cammie Sickle, MD 12/20/2015 4:51 PM

## 2016-01-22 ENCOUNTER — Other Ambulatory Visit: Payer: Self-pay | Admitting: *Deleted

## 2016-01-22 DIAGNOSIS — C9110 Chronic lymphocytic leukemia of B-cell type not having achieved remission: Secondary | ICD-10-CM

## 2016-01-22 MED ORDER — IBRUTINIB 140 MG PO CAPS
280.0000 mg | ORAL_CAPSULE | Freq: Every day | ORAL | 3 refills | Status: DC
Start: 2016-01-22 — End: 2016-11-15

## 2016-03-04 ENCOUNTER — Telehealth: Payer: Self-pay | Admitting: *Deleted

## 2016-03-04 NOTE — Telephone Encounter (Signed)
Advised per VO Dr B to see PCP

## 2016-03-04 NOTE — Telephone Encounter (Signed)
Reports that his abdominal pain is getting worse, asking if he can see Dr B or should her see someone else? Please advise

## 2016-03-25 ENCOUNTER — Inpatient Hospital Stay: Payer: Medicare Other

## 2016-03-25 ENCOUNTER — Inpatient Hospital Stay: Payer: Medicare Other | Attending: Internal Medicine | Admitting: Internal Medicine

## 2016-03-25 VITALS — BP 152/88 | HR 83 | Temp 98.1°F | Wt 190.0 lb

## 2016-03-25 DIAGNOSIS — C8300 Small cell B-cell lymphoma, unspecified site: Secondary | ICD-10-CM

## 2016-03-25 DIAGNOSIS — C9192 Lymphoid leukemia, unspecified, in relapse: Secondary | ICD-10-CM

## 2016-03-25 DIAGNOSIS — F17210 Nicotine dependence, cigarettes, uncomplicated: Secondary | ICD-10-CM | POA: Insufficient documentation

## 2016-03-25 DIAGNOSIS — Z8619 Personal history of other infectious and parasitic diseases: Secondary | ICD-10-CM | POA: Diagnosis not present

## 2016-03-25 DIAGNOSIS — C9112 Chronic lymphocytic leukemia of B-cell type in relapse: Secondary | ICD-10-CM

## 2016-03-25 DIAGNOSIS — Z7984 Long term (current) use of oral hypoglycemic drugs: Secondary | ICD-10-CM | POA: Insufficient documentation

## 2016-03-25 DIAGNOSIS — Z9221 Personal history of antineoplastic chemotherapy: Secondary | ICD-10-CM | POA: Diagnosis not present

## 2016-03-25 DIAGNOSIS — D649 Anemia, unspecified: Secondary | ICD-10-CM | POA: Insufficient documentation

## 2016-03-25 DIAGNOSIS — Z79899 Other long term (current) drug therapy: Secondary | ICD-10-CM | POA: Diagnosis not present

## 2016-03-25 DIAGNOSIS — R1013 Epigastric pain: Secondary | ICD-10-CM | POA: Insufficient documentation

## 2016-03-25 DIAGNOSIS — K5900 Constipation, unspecified: Secondary | ICD-10-CM

## 2016-03-25 DIAGNOSIS — I10 Essential (primary) hypertension: Secondary | ICD-10-CM | POA: Insufficient documentation

## 2016-03-25 DIAGNOSIS — R5381 Other malaise: Secondary | ICD-10-CM | POA: Insufficient documentation

## 2016-03-25 DIAGNOSIS — E119 Type 2 diabetes mellitus without complications: Secondary | ICD-10-CM | POA: Diagnosis not present

## 2016-03-25 LAB — CBC WITH DIFFERENTIAL/PLATELET
BASOS PCT: 1 %
Basophils Absolute: 0.1 10*3/uL (ref 0–0.1)
EOS ABS: 0.1 10*3/uL (ref 0–0.7)
EOS PCT: 1 %
HCT: 32.6 % — ABNORMAL LOW (ref 40.0–52.0)
Hemoglobin: 10.7 g/dL — ABNORMAL LOW (ref 13.0–18.0)
LYMPHS ABS: 1.7 10*3/uL (ref 1.0–3.6)
Lymphocytes Relative: 28 %
MCH: 31.7 pg (ref 26.0–34.0)
MCHC: 32.8 g/dL (ref 32.0–36.0)
MCV: 96.6 fL (ref 80.0–100.0)
Monocytes Absolute: 1 10*3/uL (ref 0.2–1.0)
Monocytes Relative: 17 %
Neutro Abs: 3.2 10*3/uL (ref 1.4–6.5)
Neutrophils Relative %: 53 %
PLATELETS: 85 10*3/uL — AB (ref 150–440)
RBC: 3.38 MIL/uL — AB (ref 4.40–5.90)
RDW: 18 % — ABNORMAL HIGH (ref 11.5–14.5)
WBC: 6 10*3/uL (ref 3.8–10.6)

## 2016-03-25 LAB — COMPREHENSIVE METABOLIC PANEL
ALBUMIN: 4.4 g/dL (ref 3.5–5.0)
ALT: 9 U/L — ABNORMAL LOW (ref 17–63)
ANION GAP: 9 (ref 5–15)
AST: 13 U/L — ABNORMAL LOW (ref 15–41)
Alkaline Phosphatase: 67 U/L (ref 38–126)
BUN: 21 mg/dL — ABNORMAL HIGH (ref 6–20)
CHLORIDE: 105 mmol/L (ref 101–111)
CO2: 22 mmol/L (ref 22–32)
Calcium: 9.1 mg/dL (ref 8.9–10.3)
Creatinine, Ser: 1.35 mg/dL — ABNORMAL HIGH (ref 0.61–1.24)
GFR calc non Af Amer: 53 mL/min — ABNORMAL LOW (ref >60)
GLUCOSE: 110 mg/dL — AB (ref 65–99)
Potassium: 4.8 mmol/L (ref 3.5–5.1)
SODIUM: 136 mmol/L (ref 135–145)
Total Bilirubin: 0.5 mg/dL (ref 0.3–1.2)
Total Protein: 7.3 g/dL (ref 6.5–8.1)

## 2016-03-25 LAB — LIPASE, BLOOD: LIPASE: 31 U/L (ref 11–51)

## 2016-03-25 LAB — AMYLASE: AMYLASE: 32 U/L (ref 28–100)

## 2016-03-25 LAB — LACTATE DEHYDROGENASE: LDH: 107 U/L (ref 98–192)

## 2016-03-25 MED ORDER — OXYCODONE-ACETAMINOPHEN 5-325 MG PO TABS
1.0000 | ORAL_TABLET | Freq: Three times a day (TID) | ORAL | 0 refills | Status: DC | PRN
Start: 2016-03-25 — End: 2016-04-08

## 2016-03-25 NOTE — Progress Notes (Signed)
Patient here today for follow up on chronic lymphoid leukemia.  Patient does have concerns about ibrutinib (IMBRUVICA) medication causing abdominal pain.

## 2016-03-25 NOTE — Progress Notes (Signed)
[facility] OFFICE PROGRESS NOTE  Patient Care Team: [name], MD as PCP - General (Internal Medicine)   SUMMARY OF ONCOLOGIC HISTORY: Oncology History   # 2006- CLL STAGE IV; MAY 2011- WBC- 57K;Platelets-99;Hb-12/CT Bulky LN; BMBx- 80% Invol; del 11; START Benda-Ritux x4 [finished Sep 2011];   # July 2015-Progression; Sep 2015-START ibrutinib; CT scan DEC 2015- Improvement LN; Cont Ibrutinib 140mg /d; NOV 2016 CT- 1-2CM LN [mild progression compared to Dec 2015];NOV 2016- FISH peripheral blood- NO MUTATIONS/CD-38 Positive; NOV 7th- CONT IBRUTINIB 2 pills/day; CT MAY 2017- Improved  # MSSA skin infection [Oct 2016] s/p clinda     CLL (chronic lymphoid leukemia) in relapse ([place])   02/20/2015 Initial Diagnosis    CLL (chronic lymphoid leukemia) in relapse [facility])       INTERVAL HISTORY:  65 year old male patient with history of relapsed CLL currently on second line therapy ibrutinib since September 2015 for follow-up; patient is currently on 2 pills of ibrutinib [secondary to intolerance]- is here for follow-up.   Patient continues to have worsening abdominal pain. Epigastric in location. Going on for the last 2 months or so; mostly constant. Not associated with any food or bowel movements. Recent CT scan in August 2017 was negative for any reason for his abdominal pain. He denies any alcohol. No night sweats. No recent infections or fevers. No diarrhea. Denies any palpitations or syncopal episodes. No bleeding. Overall he feels poorly.  REVIEW OF SYSTEMS:  A complete 10 point review of system is done which is negative except mentioned above/history of present illness.   PAST MEDICAL HISTORY :  Past Medical History:  Diagnosis Date  . CLL (chronic lymphocytic leukemia) ([place])   . Diabetes mellitus without complication ([place])   . Hematuria   . Hypertension     PAST SURGICAL HISTORY :   Past Surgical History:  Procedure Laterality Date  . CHOLECYSTECTOMY  1983     FAMILY HISTORY :   Family History  Problem Relation Age of Onset  . Hypertension Sister     SOCIAL HISTORY:   Social History  Substance Use Topics  . Smoking status: Current Every Day Smoker    Packs/day: 0.50    Years: 46.00    Types: Cigarettes  . Smokeless tobacco: Never Used  . Alcohol use 3.0 oz/week    5 Glasses of wine per week     Comment: 5 a week    ALLERGIES:  has No Known Allergies.  MEDICATIONS:  Current Outpatient Prescriptions  Medication Sig Dispense Refill  . Cyanocobalamin (RA VITAMIN B-12 TR) 1000 MCG TBCR Take by mouth.    . ibrutinib (IMBRUVICA) 140 MG capsul Take 2 capsules (280 mg total) by mouth daily. 60 capsule 3  . levofloxacin (LEVAQUIN) 500 MG tablet   1  . lisinopril (PRINIVIL,ZESTRIL) 30 MG tablet   4  . metFORMIN (GLUMETZA) 1000 MG (MOD) 24 hr tablet Take 1,000 mg by mouth 2 (two) times daily.    Marland Kitchen oxyCODONE-acetaminophen (PERCOCET/ROXICET) 5-325 MG tablet Take 1 tablet by mouth every 8 (eight) hours as needed for severe pain. 40 tablet 0  . tamsulosin (FLOMAX) 0.4 MG CAPS capsule   0   No current facility-administered medications for this visit.     PHYSICAL EXAMINATION: ECOG PERFORMANCE STATUS: 0 - Asymptomatic  BP (!) 152/88 (BP Location: Left Arm, Patient Position: Sitting)   Pulse 83   Temp 98.1 F (36.7 C) (Tympanic)   Wt 190 lb (86.2 kg)   BMI 28.89  kg/m   Filed Weights   03/25/16 1457  Weight: 190 lb (86.2 kg)    GENERAL: Well-nourished well-developed; Alert, no distress and comfortable.   Alone. EYES: no pallor or icterus OROPHARYNX: no thrush or ulceration; good dentition  NECK: supple, no masses felt LYMPH:  no palpable lymphadenopathy in the cervical, axillary or inguinal regions LUNGS: clear to auscultation and  No wheeze or crackles HEART/CVS: regular rate & rhythm and no murmurs; No lower extremity edema ABDOMEN:abdomen soft, non-tender and normal bowel sounds. Question epigastric mass. Musculoskeletal:no  cyanosis of digits and no clubbing  PSYCH: alert & oriented x 3 with fluent speech NEURO: no focal motor/sensory deficits SKIN:   No rash.  LABORATORY DATA:  I have reviewed the data as listed    Component Value Date/Time   NA 136 03/25/2016 1400   NA 142 05/03/2014 1139   K 4.8 03/25/2016 1400   K 4.7 05/03/2014 1139   CL 105 03/25/2016 1400   CL 110 (H) 05/03/2014 1139   CO2 22 03/25/2016 1400   CO2 24 05/03/2014 1139   GLUCOSE 110 (H) 03/25/2016 1400   GLUCOSE 98 05/03/2014 1139   BUN 21 (H) 03/25/2016 1400   BUN 20 (H) 05/03/2014 1139   CREATININE 1.35 (H) 03/25/2016 1400   CREATININE 1.22 08/09/2014 1122   CALCIUM 9.1 03/25/2016 1400   CALCIUM 8.6 05/03/2014 1139   PROT 7.3 03/25/2016 1400   PROT 7.5 05/03/2014 1139   ALBUMIN 4.4 03/25/2016 1400   ALBUMIN 4.1 05/03/2014 1139   AST 13 (L) 03/25/2016 1400   AST 15 05/03/2014 1139   ALT 9 (L) 03/25/2016 1400   ALT 26 05/03/2014 1139   ALKPHOS 67 03/25/2016 1400   ALKPHOS 94 05/03/2014 1139   BILITOT 0.5 03/25/2016 1400   BILITOT 0.5 05/03/2014 1139   GFRNONAA 53 (L) 03/25/2016 1400   GFRNONAA >60 08/09/2014 1122   GFRAA >60 03/25/2016 1400   GFRAA >60 08/09/2014 1122    No results found for: SPEP, UPEP  Lab Results  Component Value Date   WBC 6.0 03/25/2016   NEUTROABS 3.2 03/25/2016   HGB 10.7 (L) 03/25/2016   HCT 32.6 (L) 03/25/2016   MCV 96.6 03/25/2016   PLT 85 (L) 03/25/2016      Chemistry      Component Value Date/Time   NA 136 03/25/2016 1400   NA 142 05/03/2014 1139   K 4.8 03/25/2016 1400   K 4.7 05/03/2014 1139   CL 105 03/25/2016 1400   CL 110 (H) 05/03/2014 1139   CO2 22 03/25/2016 1400   CO2 24 05/03/2014 1139   BUN 21 (H) 03/25/2016 1400   BUN 20 (H) 05/03/2014 1139   CREATININE 1.35 (H) 03/25/2016 1400   CREATININE 1.22 08/09/2014 1122      Component Value Date/Time   CALCIUM 9.1 03/25/2016 1400   CALCIUM 8.6 05/03/2014 1139   ALKPHOS 67 03/25/2016 1400   ALKPHOS 94 05/03/2014  1139   AST 13 (L) 03/25/2016 1400   AST 15 05/03/2014 1139   ALT 9 (L) 03/25/2016 1400   ALT 26 05/03/2014 1139   BILITOT 0.5 03/25/2016 1400   BILITOT 0.5 05/03/2014 1139         ASSESSMENT & PLAN:  CLL (chronic lymphoid leukemia) in relapse ([place]) # CLL/SLL- relapsed currently on second line therapy with ibrutinib September 2016. CT AUG 22nd 2017- A/P- NED. But continued pain- ? Recurrence recommend PET scan.   # Also, HOLD ibrutinib; check lipase/amylase. Given  a prescription for Percocet one pill every 8 hours as needed. Discussed regarding constipation. Also has an appointment with Dr. Vira Agar next month  # Anemia 10/platlets- 80-90. Not iron deficient./Question CKD  # Follow-up in few days after the PET scan.    Cammie Sickle, MD 03/26/2016 10:06 AM

## 2016-03-25 NOTE — Assessment & Plan Note (Addendum)
#   CLL/SLL- relapsed currently on second line therapy with ibrutinib September 2016. CT AUG 22nd 2017- A/P- NED. But continued pain- ? Recurrence recommend PET scan.   # Also, HOLD ibrutinib; check lipase/amylase. Given a prescription for Percocet one pill every 8 hours as needed. Discussed regarding constipation. Also has an appointment with Dr. Vira Agar next month  # Anemia 10/platlets- 80-90. Not iron deficient./Question CKD  # Follow-up in few days after the PET scan.

## 2016-04-02 ENCOUNTER — Ambulatory Visit
Admission: RE | Admit: 2016-04-02 | Discharge: 2016-04-02 | Disposition: A | Discharge status: Home / Self Care | Payer: Medicare Other | Source: Ambulatory Visit | Attending: Internal Medicine | Admitting: Internal Medicine

## 2016-04-02 DIAGNOSIS — I700 Atherosclerosis of aorta: Secondary | ICD-10-CM | POA: Diagnosis not present

## 2016-04-02 DIAGNOSIS — K5730 Diverticulosis of large intestine without perforation or abscess without bleeding: Secondary | ICD-10-CM | POA: Diagnosis not present

## 2016-04-02 DIAGNOSIS — N400 Enlarged prostate without lower urinary tract symptoms: Secondary | ICD-10-CM | POA: Insufficient documentation

## 2016-04-02 DIAGNOSIS — C8590 Non-Hodgkin lymphoma, unspecified, unspecified site: Secondary | ICD-10-CM | POA: Insufficient documentation

## 2016-04-02 DIAGNOSIS — C8300 Small cell B-cell lymphoma, unspecified site: Secondary | ICD-10-CM | POA: Insufficient documentation

## 2016-04-02 DIAGNOSIS — Z9049 Acquired absence of other specified parts of digestive tract: Secondary | ICD-10-CM | POA: Diagnosis not present

## 2016-04-02 DIAGNOSIS — R599 Enlarged lymph nodes, unspecified: Secondary | ICD-10-CM | POA: Diagnosis not present

## 2016-04-02 DIAGNOSIS — I2510 Atherosclerotic heart disease of native coronary artery without angina pectoris: Secondary | ICD-10-CM | POA: Insufficient documentation

## 2016-04-02 DIAGNOSIS — C91Z2 Other lymphoid leukemia, in relapse: Secondary | ICD-10-CM | POA: Insufficient documentation

## 2016-04-02 DIAGNOSIS — C9192 Lymphoid leukemia, unspecified, in relapse: Secondary | ICD-10-CM

## 2016-04-02 DIAGNOSIS — C9112 Chronic lymphocytic leukemia of B-cell type in relapse: Secondary | ICD-10-CM

## 2016-04-02 LAB — GLUCOSE, CAPILLARY: Glucose-Capillary: 101 mg/dL — ABNORMAL HIGH (ref 65–99)

## 2016-04-02 MED ORDER — FLUDEOXYGLUCOSE F - 18 (FDG) INJECTION
12.0000 | Freq: Once | INTRAVENOUS | Status: AC | PRN
  Administered 2016-04-02: 12.82 via INTRAVENOUS

## 2016-04-04 ENCOUNTER — Ambulatory Visit: Payer: Medicare Other | Admitting: Internal Medicine

## 2016-04-08 ENCOUNTER — Inpatient Hospital Stay: Payer: Medicare Other | Attending: Internal Medicine | Admitting: Internal Medicine

## 2016-04-08 VITALS — BP 165/89 | HR 92 | Temp 98.2°F | Ht 68.0 in | Wt 198.8 lb

## 2016-04-08 DIAGNOSIS — N400 Enlarged prostate without lower urinary tract symptoms: Secondary | ICD-10-CM | POA: Diagnosis not present

## 2016-04-08 DIAGNOSIS — R1013 Epigastric pain: Secondary | ICD-10-CM | POA: Diagnosis not present

## 2016-04-08 DIAGNOSIS — E119 Type 2 diabetes mellitus without complications: Secondary | ICD-10-CM | POA: Insufficient documentation

## 2016-04-08 DIAGNOSIS — I10 Essential (primary) hypertension: Secondary | ICD-10-CM | POA: Diagnosis not present

## 2016-04-08 DIAGNOSIS — I700 Atherosclerosis of aorta: Secondary | ICD-10-CM | POA: Insufficient documentation

## 2016-04-08 DIAGNOSIS — R634 Abnormal weight loss: Secondary | ICD-10-CM | POA: Insufficient documentation

## 2016-04-08 DIAGNOSIS — K5730 Diverticulosis of large intestine without perforation or abscess without bleeding: Secondary | ICD-10-CM | POA: Diagnosis not present

## 2016-04-08 DIAGNOSIS — Z79899 Other long term (current) drug therapy: Secondary | ICD-10-CM | POA: Insufficient documentation

## 2016-04-08 DIAGNOSIS — F17210 Nicotine dependence, cigarettes, uncomplicated: Secondary | ICD-10-CM | POA: Diagnosis not present

## 2016-04-08 DIAGNOSIS — Z7984 Long term (current) use of oral hypoglycemic drugs: Secondary | ICD-10-CM | POA: Insufficient documentation

## 2016-04-08 DIAGNOSIS — I2510 Atherosclerotic heart disease of native coronary artery without angina pectoris: Secondary | ICD-10-CM

## 2016-04-08 DIAGNOSIS — C9192 Lymphoid leukemia, unspecified, in relapse: Secondary | ICD-10-CM

## 2016-04-08 DIAGNOSIS — C9112 Chronic lymphocytic leukemia of B-cell type in relapse: Secondary | ICD-10-CM | POA: Insufficient documentation

## 2016-04-08 DIAGNOSIS — C8300 Small cell B-cell lymphoma, unspecified site: Secondary | ICD-10-CM

## 2016-04-08 MED ORDER — PANTOPRAZOLE SODIUM 40 MG PO TBEC: 40.0000 mg | DELAYED_RELEASE_TABLET | Freq: Every day | ORAL | 3 refills | Status: DC

## 2016-04-08 MED ORDER — OXYCODONE-ACETAMINOPHEN 5-325 MG PO TABS: 1.0000 | ORAL_TABLET | Freq: Three times a day (TID) | ORAL | 0 refills | Status: DC | PRN

## 2016-04-08 NOTE — Progress Notes (Signed)
[place] OFFICE PROGRESS NOTE  Patient Care Team: [name], MD as PCP - General (Internal Medicine)   SUMMARY OF ONCOLOGIC HISTORY: Oncology History   # 2006- CLL STAGE IV; MAY 2011- WBC- 57K;Platelets-99;Hb-12/CT Bulky LN; BMBx- 80% Invol; del 11; START Benda-Ritux x4 [finished Sep 2011];   # July 2015-Progression; Sep 2015-START ibrutinib; CT scan DEC 2015- Improvement LN; Cont Ibrutinib 140mg /d; NOV 2016 CT- 1-2CM LN [mild progression compared to Dec 2015];NOV 2016- FISH peripheral blood- NO MUTATIONS/CD-38 Positive; NOV 7th- CONT IBRUTINIB 2 pills/day; CT AUG 2017- STABLE;  DEC 6th PET- Mild RP LN/ Retrocrural LN   SA skin infection [Oct 2016] s/p clinda     CLL (chronic lymphoid leukemia) in relapse [facility])     INTERVAL HISTORY:  65 year old male patient with history of relapsed CLL currently on second line therapy ibrutinib since September 2015 for follow-up; patient is currently on 2 pills of ibrutinib [secondary to intolerance]- is here for follow-up; We'll review the results of his PET scan.  Patient continues to have worsening abdominal pain epigastric. He stated that she has had this pain for more than a year slowly getting worse. He denies any alcohol. No night sweats. No recent infections or fevers. No diarrhea. Denies any palpitations or syncopal episodes. No bleeding. Overall he feels poorly; poor appetite. Positive weight loss. No nausea no vomiting.  REVIEW OF SYSTEMS:  A complete 10 point review of system is done which is negative except mentioned above/history of present illness.   PAST MEDICAL HISTORY :  Past Medical History:  Diagnosis Date  . CLL (chronic lymphocytic leukemia) ([place])   . Diabetes mellitus without complication ([place])   . Hematuria   . Hypertension     PAST SURGICAL HISTORY :   Past Surgical History:  Procedure Laterality Date  . CHOLECYSTECTOMY  1983    FAMILY HISTORY :   Family History  Problem Relation Age of  Onset  . Hypertension Sister     SOCIAL HISTORY:   Social History  Substance Use Topics  . Smoking status: Current Every Day Smoker    Packs/day: 0.50    Years: 46.00    Types: Cigarettes  . Smokeless tobacco: Never Used  . Alcohol use 3.0 oz/week    5 Glasses of wine per week     Comment: 5 a week    ALLERGIES:  has No Known Allergies.  MEDICATIONS:  Current Outpatient Prescriptions  Medication Sig Dispense Refill  . Cyanocobalamin (RA VITAMIN B-12 TR) 1000 MCG TBCR Take by mouth.    . ibrutinib (IMBRUVICA) 140 MG capsul Take 2 capsules (280 mg total) by mouth daily. 60 capsule 3  . levofloxacin (LEVAQUIN) 500 MG tablet   1  . lisinopril (PRINIVIL,ZESTRIL) 30 MG tablet   4  . metFORMIN (GLUMETZA) 1000 MG (MOD) 24 hr tablet Take 1,000 mg by mouth 2 (two) times daily.    Marland Kitchen oxyCODONE-acetaminophen (PERCOCET/ROXICET) 5-325 MG tablet Take 1 tablet by mouth every 8 (eight) hours as needed for severe pain. 40 tablet 0  . tamsulosin (FLOMAX) 0.4 MG CAPS capsule   0  . pantoprazole (PROTONIX) 40 MG tablet Take 1 tablet (40 mg total) by mouth daily. 1 hour prior to break fast. 60 tablet 3   No current facility-administered medications for this visit.     PHYSICAL EXAMINATION: ECOG PERFORMANCE STATUS: 0 - Asymptomatic  BP (!) 165/89 (BP Location: Left Arm, Patient Position: Sitting)   Pulse 92   Temp 98.2 F (36.8  C) (Oral)   Ht 5\' 8"  (1.727 m)   Wt 198 lb 12.8 oz (90.2 kg)   BMI 30.23 kg/m   Filed Weights   04/08/16 1525  Weight: 198 lb 12.8 oz (90.2 kg)    GENERAL: Well-nourished well-developed; Alert, no distress and comfortable.   Alone. EYES: no pallor or icterus OROPHARYNX: no thrush or ulceration; good dentition  NECK: supple, no masses felt LYMPH:  no palpable lymphadenopathy in the cervical, axillary or inguinal regions LUNGS: clear to auscultation and  No wheeze or crackles HEART/CVS: regular rate & rhythm and no murmurs; No lower extremity  edema ABDOMEN:abdomen soft, non-tender and normal bowel sounds. Question epigastric mass. Musculoskeletal:no cyanosis of digits and no clubbing  PSYCH: alert & oriented x 3 with fluent speech NEURO: no focal motor/sensory deficits SKIN:   No rash.  LABORATORY DATA:  I have reviewed the data as listed    Component Value Date/Time   NA 136 03/25/2016 1400   NA 142 05/03/2014 1139   K 4.8 03/25/2016 1400   K 4.7 05/03/2014 1139   CL 105 03/25/2016 1400   CL 110 (H) 05/03/2014 1139   CO2 22 03/25/2016 1400   CO2 24 05/03/2014 1139   GLUCOSE 110 (H) 03/25/2016 1400   GLUCOSE 98 05/03/2014 1139   BUN 21 (H) 03/25/2016 1400   BUN 20 (H) 05/03/2014 1139   CREATININE 1.35 (H) 03/25/2016 1400   CREATININE 1.22 08/09/2014 1122   CALCIUM 9.1 03/25/2016 1400   CALCIUM 8.6 05/03/2014 1139   PROT 7.3 03/25/2016 1400   PROT 7.5 05/03/2014 1139   ALBUMIN 4.4 03/25/2016 1400   ALBUMIN 4.1 05/03/2014 1139   AST 13 (L) 03/25/2016 1400   AST 15 05/03/2014 1139   ALT 9 (L) 03/25/2016 1400   ALT 26 05/03/2014 1139   ALKPHOS 67 03/25/2016 1400   ALKPHOS 94 05/03/2014 1139   BILITOT 0.5 03/25/2016 1400   BILITOT 0.5 05/03/2014 1139   GFRNONAA 53 (L) 03/25/2016 1400   GFRNONAA >60 08/09/2014 1122   GFRAA >60 03/25/2016 1400   GFRAA >60 08/09/2014 1122    No results found for: SPEP, UPEP  Lab Results  Component Value Date   WBC 6.0 03/25/2016   NEUTROABS 3.2 03/25/2016   HGB 10.7 (L) 03/25/2016   HCT 32.6 (L) 03/25/2016   MCV 96.6 03/25/2016   PLT 85 (L) 03/25/2016      Chemistry      Component Value Date/Time   NA 136 03/25/2016 1400   NA 142 05/03/2014 1139   K 4.8 03/25/2016 1400   K 4.7 05/03/2014 1139   CL 105 03/25/2016 1400   CL 110 (H) 05/03/2014 1139   CO2 22 03/25/2016 1400   CO2 24 05/03/2014 1139   BUN 21 (H) 03/25/2016 1400   BUN 20 (H) 05/03/2014 1139   CREATININE 1.35 (H) 03/25/2016 1400   CREATININE 1.22 08/09/2014 1122      Component Value Date/Time    CALCIUM 9.1 03/25/2016 1400   CALCIUM 8.6 05/03/2014 1139   ALKPHOS 67 03/25/2016 1400   ALKPHOS 94 05/03/2014 1139   AST 13 (L) 03/25/2016 1400   AST 15 05/03/2014 1139   ALT 9 (L) 03/25/2016 1400   ALT 26 05/03/2014 1139   BILITOT 0.5 03/25/2016 1400   BILITOT 0.5 05/03/2014 1139     IMPRESSION: 1. Mildly enlarged and mildly hypermetabolic retrocaval lymphadenopathy, mildly increased in size since 12/19/2015 CT study, consistent with metabolically active low grade lymphoma. 2. No  additional sites of metabolically active lymphadenopathy. 3. Relatively uniform low level marrow hypermetabolism throughout the visualized skeleton, which is nonspecific and may indicate metabolically active leukemia. 4. Additional findings include aortic atherosclerosis, left main and 3 vessel coronary atherosclerosis, minimal sigmoid diverticulosis and mild prostatomegaly.   Electronically Signed   By: Ilona Sorrel M.D.   On: 04/02/2016 13:16    ASSESSMENT & PLAN:  CLL (chronic lymphoid leukemia) in relapse ([place]) # CLL/SLL- relapsed currently on second line therapy with ibrutinib September 2016. CT AUG 22nd 2017- A/P- NED; PET small 1 cm LN RP/retrocrural LN- which should NOT be cause of this abdominal pain.   # resume Ibrutinib; no significant difference after holding it. I do not think ibrutinib or CLL/SLL is a cause of his abdominal pain.  #  Epigastric Abdominal pain ? Metformin/ HOLD metformin x 2 weeks; give a call in 1 week re: update. Asked to check blood sugars; to call us if elevated > 140 for alternative blood sugars.   # follow up with me in 6 weeks/labs.     Cammie Sickle, MD 04/08/2016 4:24 PM

## 2016-04-08 NOTE — Progress Notes (Signed)
Patient here for results. Patient continues to have pain in his stomach.

## 2016-04-08 NOTE — Assessment & Plan Note (Addendum)
#   CLL/SLL- relapsed currently on second line therapy with ibrutinib September 2016. CT AUG 22nd 2017- A/P- NED; PET small 1 cm LN RP/retrocrural LN- which should NOT be cause of this abdominal pain.   # resume Ibrutinib; no significant difference after holding it. I do not think ibrutinib or CLL/SLL is a cause of his abdominal pain.  #  Epigastric Abdominal pain ? Metformin/ HOLD metformin x 2 weeks; give a call in 1 week re: update. Asked to check blood sugars; to call us if elevated > 140 for alternative blood sugars.   # follow up with me in 6 weeks/labs.

## 2016-04-17 ENCOUNTER — Other Ambulatory Visit: Payer: Self-pay | Admitting: Urology

## 2016-04-17 DIAGNOSIS — N50811 Right testicular pain: Secondary | ICD-10-CM

## 2016-04-18 ENCOUNTER — Ambulatory Visit
Admission: RE | Admit: 2016-04-18 | Discharge: 2016-04-18 | Disposition: A | Discharge status: Home / Self Care | Payer: Medicare Other | Source: Ambulatory Visit | Attending: Urology | Admitting: Urology

## 2016-04-18 DIAGNOSIS — N433 Hydrocele, unspecified: Secondary | ICD-10-CM | POA: Diagnosis not present

## 2016-04-18 DIAGNOSIS — N50811 Right testicular pain: Secondary | ICD-10-CM | POA: Diagnosis not present

## 2016-07-10 ENCOUNTER — Telehealth: Payer: Self-pay | Admitting: *Deleted

## 2016-07-10 NOTE — Telephone Encounter (Signed)
Lauren - RN with biologics ([phone]) left vm on clinical line regarding patient. She states that she placed a call to patient to determine drug tolerance. Patient c/o of decreased appetite, stomach pain and weight loss. Pt currently on Imbruvica 140 mg (2 capsules-280 mg) daily.  I contacted patient- He states that he has had the stomach pain even before the Imbruvica. Dr .Rogue Bussing had d/c "the [place] in December for 1 week and his metformin for 2 weeks. He didn't know at the time if the abdominal pain was r/t the Imbruvica. My symptoms did not improve and are about the same as the were previously. I'm not sure if these symptoms are still related to the Imbruvica or not. I've lost 12 to 15 pounds since my last apt with Dr. Rogue Bussing. Again the abdominal pain is intermittent - comes and goes. I could go 3 to 4 days without experiencing the abdominal discomfort. My last pet scan was performed in December, so I don't think I have to worry about progression."  I explained to patient that I would let Dr. Rogue Bussing know.

## 2016-07-10 NOTE — Telephone Encounter (Signed)
Spoke with patient. He will come next Wednesday at 1:30 pm 07/17/16. Teach back process to hold imbruvica

## 2016-07-10 NOTE — Telephone Encounter (Signed)
Spoke with Dr. Rogue Bussing. V/o to ask pt to hold Imbruvica x 1 week and return to clinic next week.

## 2016-07-17 ENCOUNTER — Inpatient Hospital Stay: Payer: Medicare Other | Attending: Internal Medicine | Admitting: Internal Medicine

## 2016-07-17 VITALS — BP 188/81 | HR 72 | Temp 97.9°F | Resp 18 | Wt 201.2 lb

## 2016-07-17 DIAGNOSIS — R1084 Generalized abdominal pain: Secondary | ICD-10-CM

## 2016-07-17 DIAGNOSIS — F17210 Nicotine dependence, cigarettes, uncomplicated: Secondary | ICD-10-CM | POA: Insufficient documentation

## 2016-07-17 DIAGNOSIS — K5730 Diverticulosis of large intestine without perforation or abscess without bleeding: Secondary | ICD-10-CM

## 2016-07-17 DIAGNOSIS — I10 Essential (primary) hypertension: Secondary | ICD-10-CM | POA: Diagnosis not present

## 2016-07-17 DIAGNOSIS — R1013 Epigastric pain: Secondary | ICD-10-CM

## 2016-07-17 DIAGNOSIS — Z79899 Other long term (current) drug therapy: Secondary | ICD-10-CM | POA: Diagnosis not present

## 2016-07-17 DIAGNOSIS — E119 Type 2 diabetes mellitus without complications: Secondary | ICD-10-CM | POA: Insufficient documentation

## 2016-07-17 DIAGNOSIS — I700 Atherosclerosis of aorta: Secondary | ICD-10-CM | POA: Insufficient documentation

## 2016-07-17 DIAGNOSIS — Z7984 Long term (current) use of oral hypoglycemic drugs: Secondary | ICD-10-CM | POA: Diagnosis not present

## 2016-07-17 DIAGNOSIS — C9112 Chronic lymphocytic leukemia of B-cell type in relapse: Secondary | ICD-10-CM | POA: Diagnosis not present

## 2016-07-17 DIAGNOSIS — C9192 Lymphoid leukemia, unspecified, in relapse: Secondary | ICD-10-CM

## 2016-07-17 DIAGNOSIS — Z9221 Personal history of antineoplastic chemotherapy: Secondary | ICD-10-CM | POA: Insufficient documentation

## 2016-07-17 DIAGNOSIS — I2510 Atherosclerotic heart disease of native coronary artery without angina pectoris: Secondary | ICD-10-CM | POA: Diagnosis not present

## 2016-07-17 DIAGNOSIS — N400 Enlarged prostate without lower urinary tract symptoms: Secondary | ICD-10-CM | POA: Insufficient documentation

## 2016-07-17 DIAGNOSIS — Z7982 Long term (current) use of aspirin: Secondary | ICD-10-CM | POA: Diagnosis not present

## 2016-07-17 DIAGNOSIS — C8300 Small cell B-cell lymphoma, unspecified site: Secondary | ICD-10-CM

## 2016-07-17 MED ORDER — OXYCODONE-ACETAMINOPHEN 5-325 MG PO TABS: 1.0000 | ORAL_TABLET | Freq: Three times a day (TID) | ORAL | 0 refills | Status: DC | PRN

## 2016-07-17 NOTE — Progress Notes (Signed)
[place] OFFICE PROGRESS NOTE  Patient Care Team: [name], MD as PCP - General (Internal Medicine)   SUMMARY OF ONCOLOGIC HISTORY: Oncology History   # 2006- CLL STAGE IV; MAY 2011- WBC- 57K;Platelets-99;Hb-12/CT Bulky LN; BMBx- 80% Invol; del 11; START Benda-Ritux x4 [finished Sep 2011];   # July 2015-Progression; Sep 2015-START ibrutinib; CT scan DEC 2015- Improvement LN; Cont Ibrutinib 140mg /d; NOV 2016 CT- 1-2CM LN [mild progression compared to Dec 2015];NOV 2016- FISH peripheral blood- NO MUTATIONS/CD-38 Positive; NOV 7th- CONT IBRUTINIB 2 pills/day; CT AUG 2017- STABLE;  DEC 6th PET- Mild RP LN/ Retrocrural LN   SA skin infection [Oct 2016] s/p clinda     CLL (chronic lymphoid leukemia) in relapse [facility])     INTERVAL HISTORY:  66 year old male patient with history of relapsed CLL currently on second line therapy ibrutinib since September 2015 for follow-up; patient is currently on 2 pills of ibrutinib [secondary to intolerance]- is here for follow-up.   Patient unfortunately continues to complain of abdominal discomfort and epigastric region. It is not related to food. 3-4 episodes a week. Improved with pain medication. The pain has been ongoing for the last 2 years. He has been taken off his improvement because of the abdominal discomfort. However no improvement noted.  He denies any alcohol. No night sweats. No recent infections or fevers. No diarrhea. Denies any palpitations or syncopal episodes. No bleeding.  No nausea no vomiting.  REVIEW OF SYSTEMS:  A complete 10 point review of system is done which is negative except mentioned above/history of present illness.   PAST MEDICAL HISTORY :  Past Medical History:  Diagnosis Date  . CLL (chronic lymphocytic leukemia) ([place])   . Diabetes mellitus without complication ([place])   . Hematuria   . Hypertension     PAST SURGICAL HISTORY :   Past Surgical History:  Procedure Laterality Date  .  CHOLECYSTECTOMY  1983    FAMILY HISTORY :   Family History  Problem Relation Age of Onset  . Hypertension Sister     SOCIAL HISTORY:   Social History  Substance Use Topics  . Smoking status: Current Every Day Smoker    Packs/day: 0.50    Years: 46.00    Types: Cigarettes  . Smokeless tobacco: Never Used  . Alcohol use 3.0 oz/week    5 Glasses of wine per week     Comment: 5 a week    ALLERGIES:  has No Known Allergies.  MEDICATIONS:  Current Outpatient Prescriptions  Medication Sig Dispense Refill  . aspirin EC 81 MG tablet Take 81 mg by mouth daily.    . Cyanocobalamin (RA VITAMIN B-12 TR) 1000 MCG TBCR Take by mouth.    Marland Kitchen lisinopril (PRINIVIL,ZESTRIL) 30 MG tablet Take 30 mg by mouth daily.   4  . metFORMIN (GLUMETZA) 1000 MG (MOD) 24 hr tablet Take 1,000 mg by mouth 2 (two) times daily.    Marland Kitchen oxyCODONE-acetaminophen (PERCOCET/ROXICET) 5-325 MG tablet Take 1 tablet by mouth every 8 (eight) hours as needed for severe pain. 40 tablet 0  . ibrutinib (IMBRUVICA) 140 MG capsul Take 2 capsules (280 mg total) by mouth daily. (Patient not taking: Reported on 07/17/2016) 60 capsule 3   No current facility-administered medications for this visit.     PHYSICAL EXAMINATION: ECOG PERFORMANCE STATUS: 0 - Asymptomatic  BP (!) 188/81 (BP Location: Left Arm, Patient Position: Sitting)   Pulse 72   Temp 97.9 F (36.6 C) (Tympanic)   Resp 18  Wt 201 lb 4 oz (91.3 kg)   SpO2 98%   BMI 30.60 kg/m   Filed Weights   07/17/16 1326  Weight: 201 lb 4 oz (91.3 kg)    GENERAL: Well-nourished well-developed; Alert, no distress and comfortable.   Alone. EYES: no pallor or icterus OROPHARYNX: no thrush or ulceration; good dentition  NECK: supple, no masses felt LYMPH:  no palpable lymphadenopathy in the cervical, axillary or inguinal regions LUNGS: clear to auscultation and  No wheeze or crackles HEART/CVS: regular rate & rhythm and no murmurs; No lower extremity edema ABDOMEN:abdomen  soft, non-tender and normal bowel sounds.  Musculoskeletal:no cyanosis of digits and no clubbing  PSYCH: alert & oriented x 3 with fluent speech NEURO: no focal motor/sensory deficits SKIN:   No rash.  LABORATORY DATA:  I have reviewed the data as listed    Component Value Date/Time   NA 136 03/25/2016 1400   NA 142 05/03/2014 1139   K 4.8 03/25/2016 1400   K 4.7 05/03/2014 1139   CL 105 03/25/2016 1400   CL 110 (H) 05/03/2014 1139   CO2 22 03/25/2016 1400   CO2 24 05/03/2014 1139   GLUCOSE 110 (H) 03/25/2016 1400   GLUCOSE 98 05/03/2014 1139   BUN 21 (H) 03/25/2016 1400   BUN 20 (H) 05/03/2014 1139   CREATININE 1.35 (H) 03/25/2016 1400   CREATININE 1.22 08/09/2014 1122   CALCIUM 9.1 03/25/2016 1400   CALCIUM 8.6 05/03/2014 1139   PROT 7.3 03/25/2016 1400   PROT 7.5 05/03/2014 1139   ALBUMIN 4.4 03/25/2016 1400   ALBUMIN 4.1 05/03/2014 1139   AST 13 (L) 03/25/2016 1400   AST 15 05/03/2014 1139   ALT 9 (L) 03/25/2016 1400   ALT 26 05/03/2014 1139   ALKPHOS 67 03/25/2016 1400   ALKPHOS 94 05/03/2014 1139   BILITOT 0.5 03/25/2016 1400   BILITOT 0.5 05/03/2014 1139   GFRNONAA 53 (L) 03/25/2016 1400   GFRNONAA >60 08/09/2014 1122   GFRAA >60 03/25/2016 1400   GFRAA >60 08/09/2014 1122    No results found for: SPEP, UPEP  Lab Results  Component Value Date   WBC 6.0 03/25/2016   NEUTROABS 3.2 03/25/2016   HGB 10.7 (L) 03/25/2016   HCT 32.6 (L) 03/25/2016   MCV 96.6 03/25/2016   PLT 85 (L) 03/25/2016      Chemistry      Component Value Date/Time   NA 136 03/25/2016 1400   NA 142 05/03/2014 1139   K 4.8 03/25/2016 1400   K 4.7 05/03/2014 1139   CL 105 03/25/2016 1400   CL 110 (H) 05/03/2014 1139   CO2 22 03/25/2016 1400   CO2 24 05/03/2014 1139   BUN 21 (H) 03/25/2016 1400   BUN 20 (H) 05/03/2014 1139   CREATININE 1.35 (H) 03/25/2016 1400   CREATININE 1.22 08/09/2014 1122      Component Value Date/Time   CALCIUM 9.1 03/25/2016 1400   CALCIUM 8.6  05/03/2014 1139   ALKPHOS 67 03/25/2016 1400   ALKPHOS 94 05/03/2014 1139   AST 13 (L) 03/25/2016 1400   AST 15 05/03/2014 1139   ALT 9 (L) 03/25/2016 1400   ALT 26 05/03/2014 1139   BILITOT 0.5 03/25/2016 1400   BILITOT 0.5 05/03/2014 1139     IMPRESSION: 1. Mildly enlarged and mildly hypermetabolic retrocaval lymphadenopathy, mildly increased in size since 12/19/2015 CT study, consistent with metabolically active low grade lymphoma. 2. No additional sites of metabolically active lymphadenopathy. 3. Relatively uniform  low level marrow hypermetabolism throughout the visualized skeleton, which is nonspecific and may indicate metabolically active leukemia. 4. Additional findings include aortic atherosclerosis, left main and 3 vessel coronary atherosclerosis, minimal sigmoid diverticulosis and mild prostatomegaly.   Electronically Signed   By: Ilona Sorrel M.D.   On: 04/02/2016 13:16    ASSESSMENT & PLAN:  CLL (chronic lymphoid leukemia) in relapse ([place]) # CLL/SLL- relapsed currently on second line therapy with ibrutinib September 2016. DEC 2017- PET small 1 cm LN RP/retrocrural LN- which should NOT be cause of this abdominal pain. HOLD ibrutinib for ongoing abdominal pain [previously not improved on holding inappropriate.]  #  Epigastric Abdominal pain- not improved on holding; Refer to [facility] for chronic abdominal pain. Recommend prilosec BID. New percocet script given. Check lipase amylase.   # follow up with me in 4 weeks/labs.     Cammie Sickle, MD 07/17/2016 4:12 PM

## 2016-07-17 NOTE — Progress Notes (Signed)
Patient here today for follow up.  Patient has been holding ibrutinib (IMBRUVICA) 140 MG  Concerns of medication causing abdominal pain

## 2016-07-17 NOTE — Assessment & Plan Note (Addendum)
#   CLL/SLL- relapsed currently on second line therapy with ibrutinib September 2016. DEC 2017- PET small 1 cm LN RP/retrocrural LN- which should NOT be cause of this abdominal pain. HOLD ibrutinib for ongoing abdominal pain [previously not improved on holding inappropriate.]  #  Epigastric Abdominal pain- not improved on holding; Refer to [facility] for chronic abdominal pain. Recommend prilosec BID. New percocet script given. Check lipase amylase.   # follow up with me in 4 weeks/labs.

## 2016-07-25 ENCOUNTER — Other Ambulatory Visit: Payer: Self-pay | Admitting: *Deleted

## 2016-08-06 ENCOUNTER — Encounter: Payer: Self-pay | Admitting: *Deleted

## 2016-08-06 ENCOUNTER — Other Ambulatory Visit: Payer: Self-pay | Admitting: *Deleted

## 2016-08-06 DIAGNOSIS — C8300 Small cell B-cell lymphoma, unspecified site: Secondary | ICD-10-CM

## 2016-08-06 DIAGNOSIS — C9192 Lymphoid leukemia, unspecified, in relapse: Secondary | ICD-10-CM

## 2016-08-06 DIAGNOSIS — C9112 Chronic lymphocytic leukemia of B-cell type in relapse: Secondary | ICD-10-CM

## 2016-08-06 MED ORDER — OXYCODONE-ACETAMINOPHEN 5-325 MG PO TABS: 1.0000 | ORAL_TABLET | Freq: Three times a day (TID) | ORAL | 0 refills | Status: DC | PRN

## 2016-08-06 NOTE — Progress Notes (Signed)
Patient walked in to [facility] cancer center to inquire about starting back on the imbruvica. States that his gastro ref. Apt with Dr. Vira Agar is not until May 1. Pt informed per Dr. Rogue Bussing- not to start back on Imbruvica until he sees pt on 08/16/16. Pt gave verbal understanding. While pt was in clinic, he requested RF for percocet. Prescription refill provided.

## 2016-08-16 ENCOUNTER — Inpatient Hospital Stay: Payer: Medicare Other | Attending: Internal Medicine

## 2016-08-16 ENCOUNTER — Inpatient Hospital Stay (HOSPITAL_BASED_OUTPATIENT_CLINIC_OR_DEPARTMENT_OTHER): Payer: Medicare Other | Admitting: Internal Medicine

## 2016-08-16 VITALS — BP 181/90 | HR 64 | Temp 98.1°F | Wt 196.5 lb

## 2016-08-16 DIAGNOSIS — N400 Enlarged prostate without lower urinary tract symptoms: Secondary | ICD-10-CM

## 2016-08-16 DIAGNOSIS — F17210 Nicotine dependence, cigarettes, uncomplicated: Secondary | ICD-10-CM

## 2016-08-16 DIAGNOSIS — K5730 Diverticulosis of large intestine without perforation or abscess without bleeding: Secondary | ICD-10-CM | POA: Diagnosis not present

## 2016-08-16 DIAGNOSIS — Z7984 Long term (current) use of oral hypoglycemic drugs: Secondary | ICD-10-CM | POA: Insufficient documentation

## 2016-08-16 DIAGNOSIS — R1013 Epigastric pain: Secondary | ICD-10-CM

## 2016-08-16 DIAGNOSIS — C9192 Lymphoid leukemia, unspecified, in relapse: Secondary | ICD-10-CM

## 2016-08-16 DIAGNOSIS — Z79899 Other long term (current) drug therapy: Secondary | ICD-10-CM | POA: Insufficient documentation

## 2016-08-16 DIAGNOSIS — I10 Essential (primary) hypertension: Secondary | ICD-10-CM | POA: Diagnosis not present

## 2016-08-16 DIAGNOSIS — E119 Type 2 diabetes mellitus without complications: Secondary | ICD-10-CM

## 2016-08-16 DIAGNOSIS — I700 Atherosclerosis of aorta: Secondary | ICD-10-CM

## 2016-08-16 DIAGNOSIS — C9112 Chronic lymphocytic leukemia of B-cell type in relapse: Secondary | ICD-10-CM

## 2016-08-16 DIAGNOSIS — Z7982 Long term (current) use of aspirin: Secondary | ICD-10-CM | POA: Insufficient documentation

## 2016-08-16 DIAGNOSIS — C9110 Chronic lymphocytic leukemia of B-cell type not having achieved remission: Secondary | ICD-10-CM

## 2016-08-16 DIAGNOSIS — I2510 Atherosclerotic heart disease of native coronary artery without angina pectoris: Secondary | ICD-10-CM

## 2016-08-16 LAB — CBC WITH DIFFERENTIAL/PLATELET
BASOS ABS: 0.1 10*3/uL (ref 0–0.1)
BASOS PCT: 1 %
Eosinophils Absolute: 0.2 10*3/uL (ref 0–0.7)
Eosinophils Relative: 2 %
HEMATOCRIT: 33.8 % — AB (ref 40.0–52.0)
HEMOGLOBIN: 11.2 g/dL — AB (ref 13.0–18.0)
Lymphocytes Relative: 52 %
Lymphs Abs: 5.1 10*3/uL — ABNORMAL HIGH (ref 1.0–3.6)
MCH: 33.3 pg (ref 26.0–34.0)
MCHC: 33.2 g/dL (ref 32.0–36.0)
MCV: 100.4 fL — ABNORMAL HIGH (ref 80.0–100.0)
MONOS PCT: 6 %
Monocytes Absolute: 0.6 10*3/uL (ref 0.2–1.0)
NEUTROS ABS: 3.8 10*3/uL (ref 1.4–6.5)
NEUTROS PCT: 39 %
Platelets: 122 10*3/uL — ABNORMAL LOW (ref 150–440)
RBC: 3.36 MIL/uL — ABNORMAL LOW (ref 4.40–5.90)
RDW: 18 % — ABNORMAL HIGH (ref 11.5–14.5)
WBC: 9.8 10*3/uL (ref 3.8–10.6)

## 2016-08-16 LAB — LACTATE DEHYDROGENASE: LDH: 143 U/L (ref 98–192)

## 2016-08-16 LAB — COMPREHENSIVE METABOLIC PANEL
ALBUMIN: 4.6 g/dL (ref 3.5–5.0)
ALK PHOS: 85 U/L (ref 38–126)
ALT: 10 U/L — AB (ref 17–63)
ANION GAP: 8 (ref 5–15)
AST: 15 U/L (ref 15–41)
BILIRUBIN TOTAL: 0.7 mg/dL (ref 0.3–1.2)
BUN: 22 mg/dL — ABNORMAL HIGH (ref 6–20)
CALCIUM: 9.3 mg/dL (ref 8.9–10.3)
CO2: 24 mmol/L (ref 22–32)
Chloride: 106 mmol/L (ref 101–111)
Creatinine, Ser: 1.24 mg/dL (ref 0.61–1.24)
GFR calc non Af Amer: 58 mL/min — ABNORMAL LOW (ref >60)
Glucose, Bld: 141 mg/dL — ABNORMAL HIGH (ref 65–99)
Potassium: 4.9 mmol/L (ref 3.5–5.1)
SODIUM: 138 mmol/L (ref 135–145)
Total Protein: 7.4 g/dL (ref 6.5–8.1)

## 2016-08-16 LAB — AMYLASE: AMYLASE: 39 U/L (ref 28–100)

## 2016-08-16 LAB — LIPASE, BLOOD: LIPASE: 41 U/L (ref 11–51)

## 2016-08-16 MED ORDER — IBRUTINIB 280 MG PO TABS: 1.0000 | ORAL_TABLET | ORAL | 4 refills | Status: DC

## 2016-08-16 NOTE — Progress Notes (Signed)
[place] OFFICE PROGRESS NOTE  Patient Care Team: [name], MD as PCP - General (Internal Medicine)   SUMMARY OF ONCOLOGIC HISTORY: Oncology History   # 2006- CLL STAGE IV; MAY 2011- WBC- 57K;Platelets-99;Hb-12/CT Bulky LN; BMBx- 80% Invol; del 11; START Benda-Ritux x4 [finished Sep 2011];   # July 2015-Progression; Sep 2015-START ibrutinib; CT scan DEC 2015- Improvement LN; Cont Ibrutinib 140mg /d; NOV 2016 CT- 1-2CM LN [mild progression compared to Dec 2015];NOV 2016- FISH peripheral blood- NO MUTATIONS/CD-38 Positive; NOV 7th- CONT IBRUTINIB 2 pills/day; CT AUG 2017- STABLE;  DEC 6th PET- Mild RP LN/ Retrocrural LN   SA skin infection [Oct 2016] s/p clinda     CLL (chronic lymphoid leukemia) in relapse [facility])     INTERVAL HISTORY:  66 year old male patient with history of relapsed CLL currently on second line therapy ibrutinib since September 2015 for follow-up; patient is currently on 2 pills of ibrutinib [secondary to intolerance]- is here for follow-up.   Patient has been off ibrutinib for the last 1 month- for possible reason for his abdominal pain.  Patient's abdominal pain has not improved. Continues to be intermittent. He takes 1-2 pills of hydrocodone. He is awaiting to see GI next month. He has not had endoscopies.  He denies any alcohol. No night sweats. No recent infections or fevers. No diarrhea. Denies any palpitations or syncopal episodes. No bleeding.  No nausea no vomiting.  REVIEW OF SYSTEMS:  A complete 10 point review of system is done which is negative except mentioned above/history of present illness.   PAST MEDICAL HISTORY :  Past Medical History:  Diagnosis Date  . CLL (chronic lymphocytic leukemia) ([place])   . Diabetes mellitus without complication ([place])   . Hematuria   . Hypertension     PAST SURGICAL HISTORY :   Past Surgical History:  Procedure Laterality Date  . CHOLECYSTECTOMY  1983    FAMILY HISTORY :   Family  History  Problem Relation Age of Onset  . Hypertension Sister     SOCIAL HISTORY:   Social History  Substance Use Topics  . Smoking status: Current Every Day Smoker    Packs/day: 0.50    Years: 46.00    Types: Cigarettes  . Smokeless tobacco: Never Used  . Alcohol use 3.0 oz/week    5 Glasses of wine per week     Comment: 5 a week    ALLERGIES:  has No Known Allergies.  MEDICATIONS:  Current Outpatient Prescriptions  Medication Sig Dispense Refill  . aspirin EC 81 MG tablet Take 81 mg by mouth daily.    . Cyanocobalamin (RA VITAMIN B-12 TR) 1000 MCG TBCR Take by mouth.    Marland Kitchen lisinopril (PRINIVIL,ZESTRIL) 30 MG tablet Take 30 mg by mouth daily.   4  . metFORMIN (GLUMETZA) 1000 MG (MOD) 24 hr tablet Take 1,000 mg by mouth 2 (two) times daily.    Marland Kitchen oxyCODONE-acetaminophen (PERCOCET/ROXICET) 5-325 MG tablet Take 1 tablet by mouth every 8 (eight) hours as needed for severe pain. 40 tablet 0  . ibrutinib (IMBRUVICA) 140 MG capsul Take 2 capsules (280 mg total) by mouth daily. (Patient not taking: Reported on 08/16/2016) 60 capsule 3  . Ibrutinib (IMBRUVICA) 280 MG TABS Take 1 capsule by mouth daily. 30 tablet 4   No current facility-administered medications for this visit.     PHYSICAL EXAMINATION: ECOG PERFORMANCE STATUS: 0 - Asymptomatic  BP (!) 181/90 (BP Location: Left Arm, Patient Position: Sitting)   Pulse 64  Temp 98.1 F (36.7 C) (Tympanic)   Wt 196 lb 8 oz (89.1 kg)   BMI 29.88 kg/m   Filed Weights   08/16/16 1349  Weight: 196 lb 8 oz (89.1 kg)    GENERAL: Well-nourished well-developed; Alert, no distress and comfortable.   Alone. EYES: no pallor or icterus OROPHARYNX: no thrush or ulceration; good dentition  NECK: supple, no masses felt LYMPH:  no palpable lymphadenopathy in the cervical, axillary or inguinal regions LUNGS: clear to auscultation and  No wheeze or crackles HEART/CVS: regular rate & rhythm and no murmurs; No lower extremity  edema ABDOMEN:abdomen soft, non-tender and normal bowel sounds.  Musculoskeletal:no cyanosis of digits and no clubbing  PSYCH: alert & oriented x 3 with fluent speech NEURO: no focal motor/sensory deficits SKIN:   No rash.  LABORATORY DATA:  I have reviewed the data as listed    Component Value Date/Time   NA 138 08/16/2016 1308   NA 142 05/03/2014 1139   K 4.9 08/16/2016 1308   K 4.7 05/03/2014 1139   CL 106 08/16/2016 1308   CL 110 (H) 05/03/2014 1139   CO2 24 08/16/2016 1308   CO2 24 05/03/2014 1139   GLUCOSE 141 (H) 08/16/2016 1308   GLUCOSE 98 05/03/2014 1139   BUN 22 (H) 08/16/2016 1308   BUN 20 (H) 05/03/2014 1139   CREATININE 1.24 08/16/2016 1308   CREATININE 1.22 08/09/2014 1122   CALCIUM 9.3 08/16/2016 1308   CALCIUM 8.6 05/03/2014 1139   PROT 7.4 08/16/2016 1308   PROT 7.5 05/03/2014 1139   ALBUMIN 4.6 08/16/2016 1308   ALBUMIN 4.1 05/03/2014 1139   AST 15 08/16/2016 1308   AST 15 05/03/2014 1139   ALT 10 (L) 08/16/2016 1308   ALT 26 05/03/2014 1139   ALKPHOS 85 08/16/2016 1308   ALKPHOS 94 05/03/2014 1139   BILITOT 0.7 08/16/2016 1308   BILITOT 0.5 05/03/2014 1139   GFRNONAA 58 (L) 08/16/2016 1308   GFRNONAA >60 08/09/2014 1122   GFRAA >60 08/16/2016 1308   GFRAA >60 08/09/2014 1122    No results found for: SPEP, UPEP  Lab Results  Component Value Date   WBC 9.8 08/16/2016   NEUTROABS 3.8 08/16/2016   HGB 11.2 (L) 08/16/2016   HCT 33.8 (L) 08/16/2016   MCV 100.4 (H) 08/16/2016   PLT 122 (L) 08/16/2016      Chemistry      Component Value Date/Time   NA 138 08/16/2016 1308   NA 142 05/03/2014 1139   K 4.9 08/16/2016 1308   K 4.7 05/03/2014 1139   CL 106 08/16/2016 1308   CL 110 (H) 05/03/2014 1139   CO2 24 08/16/2016 1308   CO2 24 05/03/2014 1139   BUN 22 (H) 08/16/2016 1308   BUN 20 (H) 05/03/2014 1139   CREATININE 1.24 08/16/2016 1308   CREATININE 1.22 08/09/2014 1122      Component Value Date/Time   CALCIUM 9.3 08/16/2016 1308    CALCIUM 8.6 05/03/2014 1139   ALKPHOS 85 08/16/2016 1308   ALKPHOS 94 05/03/2014 1139   AST 15 08/16/2016 1308   AST 15 05/03/2014 1139   ALT 10 (L) 08/16/2016 1308   ALT 26 05/03/2014 1139   BILITOT 0.7 08/16/2016 1308   BILITOT 0.5 05/03/2014 1139     IMPRESSION: 1. Mildly enlarged and mildly hypermetabolic retrocaval lymphadenopathy, mildly increased in size since 12/19/2015 CT study, consistent with metabolically active low grade lymphoma. 2. No additional sites of metabolically active lymphadenopathy. 3. Relatively  uniform low level marrow hypermetabolism throughout the visualized skeleton, which is nonspecific and may indicate metabolically active leukemia. 4. Additional findings include aortic atherosclerosis, left main and 3 vessel coronary atherosclerosis, minimal sigmoid diverticulosis and mild prostatomegaly.   Electronically Signed   By: Ilona Sorrel M.D.   On: 04/02/2016 13:16    ASSESSMENT & PLAN:  CLL (chronic lymphoid leukemia) in relapse ([place]) # CLL/SLL- relapsed currently on second line therapy with ibrutinib September 2016. DEC 2017- PET small 1 cm LN RP/retrocrural LN- which should NOT be cause of this abdominal pain. Given the lack of improvement of his pain even off ibrutinib- will re-start ibrutinib today.   # Re-Start Ibrutinib; new script for 280mg /day ibrutinib was given.   #  Epigastric Abdominal pain- not improved on holding; Awaiting [name] appt on May 1st.  On prilosec BID. On percocet BID; will call for script.   # follow up with me in 4 weeks/labs.     Cammie Sickle, MD 08/18/2016 7:02 PM

## 2016-08-16 NOTE — Progress Notes (Signed)
Patient here today for follow up.   

## 2016-08-16 NOTE — Assessment & Plan Note (Addendum)
#   CLL/SLL- relapsed currently on second line therapy with ibrutinib September 2016. DEC 2017- PET small 1 cm LN RP/retrocrural LN- which should NOT be cause of this abdominal pain. Given the lack of improvement of his pain even off ibrutinib- will re-start ibrutinib today.   # Re-Start Ibrutinib; new script for 280mg /day ibrutinib was given.   #  Epigastric Abdominal pain- not improved on holding; Awaiting [name] appt on May 1st.  On prilosec BID. On percocet BID; will call for script.   # follow up with me in 4 weeks/labs.

## 2016-08-29 ENCOUNTER — Telehealth: Payer: Self-pay

## 2016-08-29 DIAGNOSIS — C8300 Small cell B-cell lymphoma, unspecified site: Secondary | ICD-10-CM

## 2016-08-29 DIAGNOSIS — C9112 Chronic lymphocytic leukemia of B-cell type in relapse: Secondary | ICD-10-CM

## 2016-08-29 DIAGNOSIS — C9192 Lymphoid leukemia, unspecified, in relapse: Secondary | ICD-10-CM

## 2016-08-29 MED ORDER — OXYCODONE-ACETAMINOPHEN 5-325 MG PO TABS: 1.0000 | ORAL_TABLET | Freq: Three times a day (TID) | ORAL | 0 refills | Status: DC | PRN

## 2016-08-29 NOTE — Telephone Encounter (Signed)
Pt needs a refill on Oxycodone.  States that he ran out today and was told by Dr. To just come to come by when he needed a refill to pick one up.  Patient is sitting in the waiting area.

## 2016-08-29 NOTE — Telephone Encounter (Signed)
I spoke with Farris Has  And told her to have him leave and I will call him when I can get Dr Rogue Bussing to ask about a refill. I asked her to explain to him that he HAS to call and cannot just drop in to the office and expect to get a refill immediately. She states she will tell him.

## 2016-09-11 ENCOUNTER — Inpatient Hospital Stay: Payer: Medicare Other | Attending: Internal Medicine

## 2016-09-11 ENCOUNTER — Inpatient Hospital Stay (HOSPITAL_BASED_OUTPATIENT_CLINIC_OR_DEPARTMENT_OTHER): Payer: Medicare Other | Admitting: Internal Medicine

## 2016-09-11 VITALS — BP 161/76 | HR 58 | Temp 97.8°F | Resp 18 | Ht 68.0 in | Wt 198.0 lb

## 2016-09-11 DIAGNOSIS — Z7982 Long term (current) use of aspirin: Secondary | ICD-10-CM | POA: Insufficient documentation

## 2016-09-11 DIAGNOSIS — Z79899 Other long term (current) drug therapy: Secondary | ICD-10-CM | POA: Diagnosis not present

## 2016-09-11 DIAGNOSIS — C9112 Chronic lymphocytic leukemia of B-cell type in relapse: Secondary | ICD-10-CM

## 2016-09-11 DIAGNOSIS — I700 Atherosclerosis of aorta: Secondary | ICD-10-CM | POA: Insufficient documentation

## 2016-09-11 DIAGNOSIS — R1013 Epigastric pain: Secondary | ICD-10-CM

## 2016-09-11 DIAGNOSIS — G8929 Other chronic pain: Secondary | ICD-10-CM

## 2016-09-11 DIAGNOSIS — N400 Enlarged prostate without lower urinary tract symptoms: Secondary | ICD-10-CM | POA: Insufficient documentation

## 2016-09-11 DIAGNOSIS — I2510 Atherosclerotic heart disease of native coronary artery without angina pectoris: Secondary | ICD-10-CM | POA: Insufficient documentation

## 2016-09-11 DIAGNOSIS — I10 Essential (primary) hypertension: Secondary | ICD-10-CM | POA: Insufficient documentation

## 2016-09-11 DIAGNOSIS — F17210 Nicotine dependence, cigarettes, uncomplicated: Secondary | ICD-10-CM | POA: Insufficient documentation

## 2016-09-11 DIAGNOSIS — E119 Type 2 diabetes mellitus without complications: Secondary | ICD-10-CM | POA: Diagnosis not present

## 2016-09-11 DIAGNOSIS — D649 Anemia, unspecified: Secondary | ICD-10-CM | POA: Insufficient documentation

## 2016-09-11 DIAGNOSIS — C8300 Small cell B-cell lymphoma, unspecified site: Secondary | ICD-10-CM

## 2016-09-11 DIAGNOSIS — C9192 Lymphoid leukemia, unspecified, in relapse: Secondary | ICD-10-CM

## 2016-09-11 DIAGNOSIS — K5730 Diverticulosis of large intestine without perforation or abscess without bleeding: Secondary | ICD-10-CM | POA: Diagnosis not present

## 2016-09-11 LAB — COMPREHENSIVE METABOLIC PANEL
ALBUMIN: 4.4 g/dL (ref 3.5–5.0)
ALT: 13 U/L — ABNORMAL LOW (ref 17–63)
AST: 15 U/L (ref 15–41)
Alkaline Phosphatase: 76 U/L (ref 38–126)
Anion gap: 5 (ref 5–15)
BUN: 18 mg/dL (ref 6–20)
CHLORIDE: 107 mmol/L (ref 101–111)
CO2: 26 mmol/L (ref 22–32)
Calcium: 8.9 mg/dL (ref 8.9–10.3)
Creatinine, Ser: 1.28 mg/dL — ABNORMAL HIGH (ref 0.61–1.24)
GFR calc Af Amer: >60 mL/min (ref >60)
GFR calc non Af Amer: 56 mL/min — ABNORMAL LOW (ref >60)
GLUCOSE: 106 mg/dL — AB (ref 65–99)
Potassium: 5 mmol/L (ref 3.5–5.1)
SODIUM: 138 mmol/L (ref 135–145)
Total Bilirubin: 0.6 mg/dL (ref 0.3–1.2)
Total Protein: 7.2 g/dL (ref 6.5–8.1)

## 2016-09-11 LAB — CBC WITH DIFFERENTIAL/PLATELET
BASOS PCT: 1 %
Basophils Absolute: 0.1 10*3/uL (ref 0–0.1)
EOS ABS: 0.1 10*3/uL (ref 0–0.7)
EOS PCT: 1 %
HCT: 32.6 % — ABNORMAL LOW (ref 40.0–52.0)
HEMOGLOBIN: 10.7 g/dL — AB (ref 13.0–18.0)
LYMPHS ABS: 6.6 10*3/uL — AB (ref 1.0–3.6)
Lymphocytes Relative: 57 %
MCH: 32.9 pg (ref 26.0–34.0)
MCHC: 33 g/dL (ref 32.0–36.0)
MCV: 99.9 fL (ref 80.0–100.0)
Monocytes Absolute: 0.9 10*3/uL (ref 0.2–1.0)
Monocytes Relative: 7 %
NEUTROS PCT: 34 %
Neutro Abs: 3.9 10*3/uL (ref 1.4–6.5)
PLATELETS: 89 10*3/uL — AB (ref 150–440)
RBC: 3.26 MIL/uL — ABNORMAL LOW (ref 4.40–5.90)
RDW: 17.4 % — ABNORMAL HIGH (ref 11.5–14.5)
WBC: 11.5 10*3/uL — AB (ref 3.8–10.6)

## 2016-09-11 LAB — LACTATE DEHYDROGENASE: LDH: 128 U/L (ref 98–192)

## 2016-09-11 MED ORDER — OXYCODONE-ACETAMINOPHEN 5-325 MG PO TABS: ORAL_TABLET | ORAL | 0 refills | Status: DC

## 2016-09-11 NOTE — Assessment & Plan Note (Addendum)
#   CLL/SLL- relapsed currently on second line therapy with ibrutinib September 2016. DEC 2017- PET small 1 cm LN RP/retrocrural LN- which should NOT be cause of this abdominal pain [see discussion below].   # Continue Ibrutinib 280mg /day; mild anemia hemoglobin 10.5 platelets - 89 secondary to ibrutinib.   # Chronic Epigastric Abdominal pain- not improved; awaiting EGD on July25th [[name]]; still on percocet BID. New prescription given.  # follow up with me in 8 weeks/labs.

## 2016-09-11 NOTE — Progress Notes (Signed)
[place] OFFICE PROGRESS NOTE  Patient Care Team: [name], MD as PCP - General (Internal Medicine)   SUMMARY OF ONCOLOGIC HISTORY: Oncology History   # 2006- CLL STAGE IV; MAY 2011- WBC- 57K;Platelets-99;Hb-12/CT Bulky LN; BMBx- 80% Invol; del 11; START Benda-Ritux x4 [finished Sep 2011];   # July 2015-Progression; Sep 2015-START ibrutinib; CT scan DEC 2015- Improvement LN; Cont Ibrutinib '140mg'$ /d; NOV 2016 CT- 1-2CM LN [mild progression compared to Dec 2015];NOV 2016- FISH peripheral blood- NO MUTATIONS/CD-38 Positive; NOV 7th- CONT IBRUTINIB 2 pills/day; CT AUG 2017- STABLE;  DEC 6th PET- Mild RP LN/ Retrocrural LN   SA skin infection [Oct 2016] s/p clinda     CLL (chronic lymphoid leukemia) in relapse [facility])     INTERVAL HISTORY:  66 year old male patient with history of relapsed CLL currently on second line therapy ibrutinib since September 2015 for follow-up; patient is currently on 2 pills of ibrutinib [secondary to intolerance]- is here for follow-up.   Patient's abdominal pain has not improved. Continues to be intermittent. He takes 1-2 pills of hydrocodone.He has been evaluated by GI awaiting endoscopy in June 2018.   He denies any alcohol. No night sweats. No recent infections or fevers. No diarrhea. Denies any palpitations or syncopal episodes. No bleeding.  No nausea no vomiting.  REVIEW OF SYSTEMS:  A complete 10 point review of system is done which is negative except mentioned above/history of present illness.   PAST MEDICAL HISTORY :  Past Medical History:  Diagnosis Date  . CLL (chronic lymphocytic leukemia) ([place])   . Diabetes mellitus without complication ([place])   . Hematuria   . Hypertension     PAST SURGICAL HISTORY :   Past Surgical History:  Procedure Laterality Date  . CHOLECYSTECTOMY  1983    FAMILY HISTORY :   Family History  Problem Relation Age of Onset  . Hypertension Sister     SOCIAL HISTORY:   Social History   Substance Use Topics  . Smoking status: Current Every Day Smoker    Packs/day: 0.50    Years: 46.00    Types: Cigarettes  . Smokeless tobacco: Never Used  . Alcohol use 3.0 oz/week    5 Glasses of wine per week     Comment: 5 a week    ALLERGIES:  has No Known Allergies.  MEDICATIONS:  Current Outpatient Prescriptions  Medication Sig Dispense Refill  . aspirin EC 81 MG tablet Take 81 mg by mouth daily.    . Cyanocobalamin (B-12 COMPLIANCE INJECTION) 1000 MCG/ML KIT Inject 1 mL as directed every 4 (four) months.    . Cyanocobalamin (RA VITAMIN B-12 TR) 1000 MCG TBCR Take by mouth.    . ibrutinib (IMBRUVICA) 140 MG capsul Take 2 capsules (280 mg total) by mouth daily. 60 capsule 3  . lisinopril (PRINIVIL,ZESTRIL) 30 MG tablet Take 30 mg by mouth daily.   4  . metFORMIN (GLUMETZA) 1000 MG (MOD) 24 hr tablet Take 1,000 mg by mouth 2 (two) times daily.    Marland Kitchen oxyCODONE-acetaminophen (PERCOCET/ROXICET) 5-325 MG tablet 1 pill every 8-12 hours 60 tablet 0  . Ibrutinib (IMBRUVICA) 280 MG TABS Take 1 capsule by mouth daily. (Patient not taking: Reported on 09/11/2016) 30 tablet 4   No current facility-administered medications for this visit.     PHYSICAL EXAMINATION: ECOG PERFORMANCE STATUS: 0 - Asymptomatic  BP (!) 161/76 (BP Location: Left Arm, Patient Position: Sitting)   Pulse (!) 58   Temp 97.8 F (36.6 C) (Tympanic)  Resp 18   Ht 5' 8"  (1.727 m)   Wt 198 lb (89.8 kg)   BMI 30.11 kg/m   Filed Weights   09/11/16 1512  Weight: 198 lb (89.8 kg)    GENERAL: Well-nourished well-developed; Alert, no distress and comfortable.   Alone. EYES: no pallor or icterus OROPHARYNX: no thrush or ulceration; good dentition  NECK: supple, no masses felt LYMPH:  no palpable lymphadenopathy in the cervical, axillary or inguinal regions LUNGS: clear to auscultation and  No wheeze or crackles HEART/CVS: regular rate & rhythm and no murmurs; No lower extremity edema ABDOMEN:abdomen soft,  non-tender and normal bowel sounds.  Musculoskeletal:no cyanosis of digits and no clubbing  PSYCH: alert & oriented x 3 with fluent speech NEURO: no focal motor/sensory deficits SKIN:   No rash.  LABORATORY DATA:  I have reviewed the data as listed    Component Value Date/Time   NA 138 09/11/2016 1435   NA 142 05/03/2014 1139   K 5.0 09/11/2016 1435   K 4.7 05/03/2014 1139   CL 107 09/11/2016 1435   CL 110 (H) 05/03/2014 1139   CO2 26 09/11/2016 1435   CO2 24 05/03/2014 1139   GLUCOSE 106 (H) 09/11/2016 1435   GLUCOSE 98 05/03/2014 1139   BUN 18 09/11/2016 1435   BUN 20 (H) 05/03/2014 1139   CREATININE 1.28 (H) 09/11/2016 1435   CREATININE 1.22 08/09/2014 1122   CALCIUM 8.9 09/11/2016 1435   CALCIUM 8.6 05/03/2014 1139   PROT 7.2 09/11/2016 1435   PROT 7.5 05/03/2014 1139   ALBUMIN 4.4 09/11/2016 1435   ALBUMIN 4.1 05/03/2014 1139   AST 15 09/11/2016 1435   AST 15 05/03/2014 1139   ALT 13 (L) 09/11/2016 1435   ALT 26 05/03/2014 1139   ALKPHOS 76 09/11/2016 1435   ALKPHOS 94 05/03/2014 1139   BILITOT 0.6 09/11/2016 1435   BILITOT 0.5 05/03/2014 1139   GFRNONAA 56 (L) 09/11/2016 1435   GFRNONAA >60 08/09/2014 1122   GFRAA >60 09/11/2016 1435   GFRAA >60 08/09/2014 1122    No results found for: SPEP, UPEP  Lab Results  Component Value Date   WBC 11.5 (H) 09/11/2016   NEUTROABS 3.9 09/11/2016   HGB 10.7 (L) 09/11/2016   HCT 32.6 (L) 09/11/2016   MCV 99.9 09/11/2016   PLT 89 (L) 09/11/2016      Chemistry      Component Value Date/Time   NA 138 09/11/2016 1435   NA 142 05/03/2014 1139   K 5.0 09/11/2016 1435   K 4.7 05/03/2014 1139   CL 107 09/11/2016 1435   CL 110 (H) 05/03/2014 1139   CO2 26 09/11/2016 1435   CO2 24 05/03/2014 1139   BUN 18 09/11/2016 1435   BUN 20 (H) 05/03/2014 1139   CREATININE 1.28 (H) 09/11/2016 1435   CREATININE 1.22 08/09/2014 1122      Component Value Date/Time   CALCIUM 8.9 09/11/2016 1435   CALCIUM 8.6 05/03/2014 1139    ALKPHOS 76 09/11/2016 1435   ALKPHOS 94 05/03/2014 1139   AST 15 09/11/2016 1435   AST 15 05/03/2014 1139   ALT 13 (L) 09/11/2016 1435   ALT 26 05/03/2014 1139   BILITOT 0.6 09/11/2016 1435   BILITOT 0.5 05/03/2014 1139     IMPRESSION: 1. Mildly enlarged and mildly hypermetabolic retrocaval lymphadenopathy, mildly increased in size since 12/19/2015 CT study, consistent with metabolically active low grade lymphoma. 2. No additional sites of metabolically active lymphadenopathy. 3. Relatively uniform  low level marrow hypermetabolism throughout the visualized skeleton, which is nonspecific and may indicate metabolically active leukemia. 4. Additional findings include aortic atherosclerosis, left main and 3 vessel coronary atherosclerosis, minimal sigmoid diverticulosis and mild prostatomegaly.   Electronically Signed   By: Ilona Sorrel M.D.   On: 04/02/2016 13:16    ASSESSMENT & PLAN:  CLL (chronic lymphoid leukemia) in relapse ([place]) # CLL/SLL- relapsed currently on second line therapy with ibrutinib September 2016. DEC 2017- PET small 1 cm LN RP/retrocrural LN- which should NOT be cause of this abdominal pain [see discussion below].   # Continue Ibrutinib 265m/day; mild anemia hemoglobin 10.5 platelets - 89 secondary to ibrutinib.   # Chronic Epigastric Abdominal pain- not improved; awaiting EGD on July25th [[name]]; still on percocet BID. New prescription given.  # follow up with me in 8 weeks/labs.     GCammie Sickle MD 09/15/2016 6:42 PM

## 2016-10-07 ENCOUNTER — Other Ambulatory Visit: Payer: Self-pay | Admitting: *Deleted

## 2016-10-07 ENCOUNTER — Other Ambulatory Visit: Payer: Medicare Other

## 2016-10-07 ENCOUNTER — Ambulatory Visit: Payer: Medicare Other | Admitting: Internal Medicine

## 2016-10-07 DIAGNOSIS — C9192 Lymphoid leukemia, unspecified, in relapse: Secondary | ICD-10-CM

## 2016-10-07 DIAGNOSIS — C9112 Chronic lymphocytic leukemia of B-cell type in relapse: Secondary | ICD-10-CM

## 2016-10-07 DIAGNOSIS — C8300 Small cell B-cell lymphoma, unspecified site: Secondary | ICD-10-CM

## 2016-10-07 MED ORDER — OXYCODONE-ACETAMINOPHEN 5-325 MG PO TABS: ORAL_TABLET | ORAL | 0 refills | Status: DC

## 2016-10-16 DIAGNOSIS — D649 Anemia, unspecified: Secondary | ICD-10-CM

## 2016-10-16 DIAGNOSIS — F17200 Nicotine dependence, unspecified, uncomplicated: Secondary | ICD-10-CM | POA: Insufficient documentation

## 2016-11-05 ENCOUNTER — Other Ambulatory Visit: Payer: Self-pay | Admitting: *Deleted

## 2016-11-05 DIAGNOSIS — C9112 Chronic lymphocytic leukemia of B-cell type in relapse: Secondary | ICD-10-CM

## 2016-11-05 DIAGNOSIS — C8300 Small cell B-cell lymphoma, unspecified site: Secondary | ICD-10-CM

## 2016-11-05 DIAGNOSIS — C9192 Lymphoid leukemia, unspecified, in relapse: Secondary | ICD-10-CM

## 2016-11-05 MED ORDER — OXYCODONE-ACETAMINOPHEN 5-325 MG PO TABS: ORAL_TABLET | ORAL | 0 refills | Status: DC

## 2016-11-15 ENCOUNTER — Inpatient Hospital Stay: Payer: Medicare Other | Attending: Internal Medicine

## 2016-11-15 ENCOUNTER — Inpatient Hospital Stay (HOSPITAL_BASED_OUTPATIENT_CLINIC_OR_DEPARTMENT_OTHER): Payer: Medicare Other | Admitting: Internal Medicine

## 2016-11-15 VITALS — BP 189/95 | HR 60 | Temp 97.8°F | Resp 20 | Ht 68.0 in | Wt 197.5 lb

## 2016-11-15 DIAGNOSIS — Z9049 Acquired absence of other specified parts of digestive tract: Secondary | ICD-10-CM | POA: Diagnosis not present

## 2016-11-15 DIAGNOSIS — D631 Anemia in chronic kidney disease: Secondary | ICD-10-CM

## 2016-11-15 DIAGNOSIS — Z7984 Long term (current) use of oral hypoglycemic drugs: Secondary | ICD-10-CM | POA: Insufficient documentation

## 2016-11-15 DIAGNOSIS — G8929 Other chronic pain: Secondary | ICD-10-CM | POA: Insufficient documentation

## 2016-11-15 DIAGNOSIS — I2510 Atherosclerotic heart disease of native coronary artery without angina pectoris: Secondary | ICD-10-CM | POA: Insufficient documentation

## 2016-11-15 DIAGNOSIS — F17210 Nicotine dependence, cigarettes, uncomplicated: Secondary | ICD-10-CM | POA: Diagnosis not present

## 2016-11-15 DIAGNOSIS — K5730 Diverticulosis of large intestine without perforation or abscess without bleeding: Secondary | ICD-10-CM | POA: Diagnosis not present

## 2016-11-15 DIAGNOSIS — Z79899 Other long term (current) drug therapy: Secondary | ICD-10-CM | POA: Diagnosis not present

## 2016-11-15 DIAGNOSIS — I700 Atherosclerosis of aorta: Secondary | ICD-10-CM | POA: Insufficient documentation

## 2016-11-15 DIAGNOSIS — R1013 Epigastric pain: Secondary | ICD-10-CM | POA: Insufficient documentation

## 2016-11-15 DIAGNOSIS — Z7982 Long term (current) use of aspirin: Secondary | ICD-10-CM

## 2016-11-15 DIAGNOSIS — E1122 Type 2 diabetes mellitus with diabetic chronic kidney disease: Secondary | ICD-10-CM | POA: Diagnosis not present

## 2016-11-15 DIAGNOSIS — C9192 Lymphoid leukemia, unspecified, in relapse: Secondary | ICD-10-CM

## 2016-11-15 DIAGNOSIS — N189 Chronic kidney disease, unspecified: Secondary | ICD-10-CM

## 2016-11-15 DIAGNOSIS — I129 Hypertensive chronic kidney disease with stage 1 through stage 4 chronic kidney disease, or unspecified chronic kidney disease: Secondary | ICD-10-CM

## 2016-11-15 DIAGNOSIS — C9112 Chronic lymphocytic leukemia of B-cell type in relapse: Secondary | ICD-10-CM

## 2016-11-15 LAB — COMPREHENSIVE METABOLIC PANEL
ALBUMIN: 4.2 g/dL (ref 3.5–5.0)
ALK PHOS: 74 U/L (ref 38–126)
ALT: 12 U/L — AB (ref 17–63)
AST: 18 U/L (ref 15–41)
Anion gap: 5 (ref 5–15)
BILIRUBIN TOTAL: 0.5 mg/dL (ref 0.3–1.2)
BUN: 21 mg/dL — AB (ref 6–20)
CALCIUM: 8.8 mg/dL — AB (ref 8.9–10.3)
CO2: 25 mmol/L (ref 22–32)
CREATININE: 1.32 mg/dL — AB (ref 0.61–1.24)
Chloride: 107 mmol/L (ref 101–111)
GFR calc Af Amer: >60 mL/min (ref >60)
GFR calc non Af Amer: 54 mL/min — ABNORMAL LOW (ref >60)
GLUCOSE: 109 mg/dL — AB (ref 65–99)
Potassium: 4.7 mmol/L (ref 3.5–5.1)
Sodium: 137 mmol/L (ref 135–145)
TOTAL PROTEIN: 7 g/dL (ref 6.5–8.1)

## 2016-11-15 LAB — CBC WITH DIFFERENTIAL/PLATELET
BASOS ABS: 0.1 10*3/uL (ref 0–0.1)
BASOS PCT: 1 %
EOS PCT: 1 %
Eosinophils Absolute: 0.1 10*3/uL (ref 0–0.7)
HCT: 31.3 % — ABNORMAL LOW (ref 40.0–52.0)
Hemoglobin: 10.4 g/dL — ABNORMAL LOW (ref 13.0–18.0)
Lymphocytes Relative: 50 %
Lymphs Abs: 5.4 10*3/uL — ABNORMAL HIGH (ref 1.0–3.6)
MCH: 33 pg (ref 26.0–34.0)
MCHC: 33.2 g/dL (ref 32.0–36.0)
MCV: 99.3 fL (ref 80.0–100.0)
MONO ABS: 1 10*3/uL (ref 0.2–1.0)
Monocytes Relative: 9 %
Neutro Abs: 4.2 10*3/uL (ref 1.4–6.5)
Neutrophils Relative %: 39 %
Platelets: 107 10*3/uL — ABNORMAL LOW (ref 150–440)
RBC: 3.15 MIL/uL — ABNORMAL LOW (ref 4.40–5.90)
RDW: 17.7 % — AB (ref 11.5–14.5)
WBC: 10.8 10*3/uL — ABNORMAL HIGH (ref 3.8–10.6)

## 2016-11-15 LAB — LACTATE DEHYDROGENASE: LDH: 145 U/L (ref 98–192)

## 2016-11-15 MED ORDER — AMLODIPINE BESYLATE 5 MG PO TABS: 5.0000 mg | ORAL_TABLET | Freq: Every day | ORAL | 3 refills | Status: DC

## 2016-11-15 NOTE — Progress Notes (Signed)
[facility] OFFICE PROGRESS NOTE  Patient Care Team: [name], MD as PCP - General (Internal Medicine)   SUMMARY OF ONCOLOGIC HISTORY: Oncology History   # 2006- CLL STAGE IV; MAY 2011- WBC- 57K;Platelets-99;Hb-12/CT Bulky LN; BMBx- 80% Invol; del 11; START Benda-Ritux x4 [finished Sep 2011];   # July 2015-Progression; Sep 2015-START ibrutinib; CT scan DEC 2015- Improvement LN; Cont Ibrutinib '140mg'$ /d; NOV 2016 CT- 1-2CM LN [mild progression compared to Dec 2015];NOV 2016- FISH peripheral blood- NO MUTATIONS/CD-38 Positive; NOV 7th- CONT IBRUTINIB 2 pills/day; CT AUG 2017- STABLE;  DEC 6th PET- Mild RP LN/ Retrocrural LN   SA skin infection [Oct 2016] s/p clinda     CLL (chronic lymphoid leukemia) in relapse [facility])     INTERVAL HISTORY:  66 year old male patient with history of relapsed CLL currently on second line therapy ibrutinib since September 2015 for follow-up; patient is currently on 2 pills of ibrutinib [secondary to intolerance]- is here for follow-up.   Patient continues to have intermittent abdominal pain; is currently on oxycodone which seems to be helping. He is awaiting to see GI next week.  No night sweats. No recent infections or fevers. No diarrhea. Denies any palpitations or syncopal episodes. No bleeding.  No nausea no vomiting. Denies any easy bruising or bleeding.  REVIEW OF SYSTEMS:  A complete 10 point review of system is done which is negative except mentioned above/history of present illness.   PAST MEDICAL HISTORY :  Past Medical History:  Diagnosis Date  . CLL (chronic lymphocytic leukemia) ([place])   . Diabetes mellitus without complication ([place])   . Hematuria   . Hypertension     PAST SURGICAL HISTORY :   Past Surgical History:  Procedure Laterality Date  . CHOLECYSTECTOMY  1983    FAMILY HISTORY :   Family History  Problem Relation Age of Onset  . Hypertension Sister     SOCIAL HISTORY:   Social History  Substance  Use Topics  . Smoking status: Current Every Day Smoker    Packs/day: 0.50    Years: 46.00    Types: Cigarettes  . Smokeless tobacco: Never Used  . Alcohol use 3.0 oz/week    5 Glasses of wine per week     Comment: 5 a week    ALLERGIES:  has No Known Allergies.  MEDICATIONS:  Current Outpatient Prescriptions  Medication Sig Dispense Refill  . aspirin EC 81 MG tablet Take 81 mg by mouth daily.    . Cyanocobalamin (B-12 COMPLIANCE INJECTION) 1000 MCG/ML KIT Inject 1 mL as directed every 4 (four) months.    . Cyanocobalamin (RA VITAMIN B-12 TR) 1000 MCG TBCR Take by mouth.    . Ibrutinib (IMBRUVICA) 280 MG TABS Take 1 capsule by mouth daily. 30 tablet 4  . lisinopril (PRINIVIL,ZESTRIL) 30 MG tablet Take 30 mg by mouth daily.   4  . metFORMIN (GLUMETZA) 1000 MG (MOD) 24 hr tablet Take 1,000 mg by mouth 2 (two) times daily.    Marland Kitchen oxyCODONE-acetaminophen (PERCOCET/ROXICET) 5-325 MG tablet 1 pill every 8-12 hours 60 tablet 0  . amLODipine (NORVASC) 5 MG tablet Take 1 tablet (5 mg total) by mouth daily. 30 tablet 3   No current facility-administered medications for this visit.     PHYSICAL EXAMINATION: ECOG PERFORMANCE STATUS: 0 - Asymptomatic  BP (!) 189/95 Comment: recheck  Pulse 60   Temp 97.8 F (36.6 C) (Tympanic)   Resp 20   Ht '5\' 8"'$  (1.727 m)   Wt  197 lb 8 oz (89.6 kg)   BMI 30.03 kg/m   Filed Weights   11/15/16 1406  Weight: 197 lb 8 oz (89.6 kg)    GENERAL: Well-nourished well-developed; Alert, no distress and comfortable.   Alone. EYES: no pallor or icterus OROPHARYNX: no thrush or ulceration; good dentition  NECK: supple, no masses felt LYMPH:  no palpable lymphadenopathy in the cervical, axillary or inguinal regions LUNGS: clear to auscultation and  No wheeze or crackles HEART/CVS: regular rate & rhythm and no murmurs; No lower extremity edema ABDOMEN:abdomen soft, non-tender and normal bowel sounds.  Musculoskeletal:no cyanosis of digits and no clubbing   PSYCH: alert & oriented x 3 with fluent speech NEURO: no focal motor/sensory deficits SKIN:   No rash.  LABORATORY DATA:  I have reviewed the data as listed    Component Value Date/Time   NA 137 11/15/2016 1345   NA 142 05/03/2014 1139   K 4.7 11/15/2016 1345   K 4.7 05/03/2014 1139   CL 107 11/15/2016 1345   CL 110 (H) 05/03/2014 1139   CO2 25 11/15/2016 1345   CO2 24 05/03/2014 1139   GLUCOSE 109 (H) 11/15/2016 1345   GLUCOSE 98 05/03/2014 1139   BUN 21 (H) 11/15/2016 1345   BUN 20 (H) 05/03/2014 1139   CREATININE 1.32 (H) 11/15/2016 1345   CREATININE 1.22 08/09/2014 1122   CALCIUM 8.8 (L) 11/15/2016 1345   CALCIUM 8.6 05/03/2014 1139   PROT 7.0 11/15/2016 1345   PROT 7.5 05/03/2014 1139   ALBUMIN 4.2 11/15/2016 1345   ALBUMIN 4.1 05/03/2014 1139   AST 18 11/15/2016 1345   AST 15 05/03/2014 1139   ALT 12 (L) 11/15/2016 1345   ALT 26 05/03/2014 1139   ALKPHOS 74 11/15/2016 1345   ALKPHOS 94 05/03/2014 1139   BILITOT 0.5 11/15/2016 1345   BILITOT 0.5 05/03/2014 1139   GFRNONAA 54 (L) 11/15/2016 1345   GFRNONAA >60 08/09/2014 1122   GFRAA >60 11/15/2016 1345   GFRAA >60 08/09/2014 1122    No results found for: SPEP, UPEP  Lab Results  Component Value Date   WBC 10.8 (H) 11/15/2016   NEUTROABS 4.2 11/15/2016   HGB 10.4 (L) 11/15/2016   HCT 31.3 (L) 11/15/2016   MCV 99.3 11/15/2016   PLT 107 (L) 11/15/2016      Chemistry      Component Value Date/Time   NA 137 11/15/2016 1345   NA 142 05/03/2014 1139   K 4.7 11/15/2016 1345   K 4.7 05/03/2014 1139   CL 107 11/15/2016 1345   CL 110 (H) 05/03/2014 1139   CO2 25 11/15/2016 1345   CO2 24 05/03/2014 1139   BUN 21 (H) 11/15/2016 1345   BUN 20 (H) 05/03/2014 1139   CREATININE 1.32 (H) 11/15/2016 1345   CREATININE 1.22 08/09/2014 1122      Component Value Date/Time   CALCIUM 8.8 (L) 11/15/2016 1345   CALCIUM 8.6 05/03/2014 1139   ALKPHOS 74 11/15/2016 1345   ALKPHOS 94 05/03/2014 1139   AST 18  11/15/2016 1345   AST 15 05/03/2014 1139   ALT 12 (L) 11/15/2016 1345   ALT 26 05/03/2014 1139   BILITOT 0.5 11/15/2016 1345   BILITOT 0.5 05/03/2014 1139     IMPRESSION: 1. Mildly enlarged and mildly hypermetabolic retrocaval lymphadenopathy, mildly increased in size since 12/19/2015 CT study, consistent with metabolically active low grade lymphoma. 2. No additional sites of metabolically active lymphadenopathy. 3. Relatively uniform low level marrow hypermetabolism  throughout the visualized skeleton, which is nonspecific and may indicate metabolically active leukemia. 4. Additional findings include aortic atherosclerosis, left main and 3 vessel coronary atherosclerosis, minimal sigmoid diverticulosis and mild prostatomegaly.   Electronically Signed   By: Ilona Sorrel M.D.   On: 04/02/2016 13:16    ASSESSMENT & PLAN:  CLL (chronic lymphoid leukemia) in relapse ([place]) # CLL/SLL- relapsed currently on second line therapy with ibrutinib September 2016. DEC 2017- PET small 1 cm LN RP/retrocrural LN. no evidence of clinical progression.   # Continue Ibrutinib 243m/day; mild anemia hemoglobin 10.5 platelets - 107 secondary to ibrutinib.   # Elevated Blood pressure- sec to ibrutinib. Add norvasc 5 mg/day; continue lisinopril 30 mg/day.   # Anemia/CKD- II/ SLL-] stable.   # smoking- Discussed with the patient regarding the ill effects of smoking- including but not limited to cardiac lung and vascular diseases and malignancies. Counseled against smoking; patient interested in quitting.   # Chronic Epigastric Abdominal pain- not improved; awaiting EGD on July 25th [[name]]; still on percocet BID.   # follow up with me in 4 weeks// no labs/ BP mngt.    GCammie Sickle MD 11/15/2016 5:31 PM

## 2016-11-15 NOTE — Assessment & Plan Note (Addendum)
#   CLL/SLL- relapsed currently on second line therapy with ibrutinib September 2016. DEC 2017- PET small 1 cm LN RP/retrocrural LN. no evidence of clinical progression.   # Continue Ibrutinib 280mg /day; mild anemia hemoglobin 10.5 platelets - 107 secondary to ibrutinib.   # Elevated Blood pressure- sec to ibrutinib. Add norvasc 5 mg/day; continue lisinopril 30 mg/day.   # Anemia/CKD- II/ SLL-] stable.   # smoking- Discussed with the patient regarding the ill effects of smoking- including but not limited to cardiac lung and vascular diseases and malignancies. Counseled against smoking; patient interested in quitting.   # Chronic Epigastric Abdominal pain- not improved; awaiting EGD on July 25th [[name]]; still on percocet BID.   # follow up with me in 4 weeks// no labs/ BP mngt.

## 2016-11-15 NOTE — Progress Notes (Signed)
Patient has not missed any doses of Ibrutinib. He has tolerated the drug without any side effects.

## 2016-11-20 ENCOUNTER — Encounter
Admission: RE | Disposition: A | Discharge status: Home / Self Care | Payer: Self-pay | Source: Ambulatory Visit | Attending: Unknown Physician Specialty

## 2016-11-20 ENCOUNTER — Ambulatory Visit
Admission: RE | Admit: 2016-11-20 | Discharge: 2016-11-20 | Disposition: A | Discharge status: Home / Self Care | Payer: Medicare Other | Source: Ambulatory Visit | Attending: Unknown Physician Specialty | Admitting: Unknown Physician Specialty

## 2016-11-20 ENCOUNTER — Ambulatory Visit: Payer: Medicare Other | Admitting: Anesthesiology

## 2016-11-20 ENCOUNTER — Encounter: Payer: Self-pay | Admitting: Anesthesiology

## 2016-11-20 DIAGNOSIS — I10 Essential (primary) hypertension: Secondary | ICD-10-CM | POA: Insufficient documentation

## 2016-11-20 DIAGNOSIS — Z7984 Long term (current) use of oral hypoglycemic drugs: Secondary | ICD-10-CM | POA: Insufficient documentation

## 2016-11-20 DIAGNOSIS — C9110 Chronic lymphocytic leukemia of B-cell type not having achieved remission: Secondary | ICD-10-CM | POA: Diagnosis not present

## 2016-11-20 DIAGNOSIS — R1013 Epigastric pain: Secondary | ICD-10-CM | POA: Diagnosis not present

## 2016-11-20 DIAGNOSIS — K3189 Other diseases of stomach and duodenum: Secondary | ICD-10-CM | POA: Insufficient documentation

## 2016-11-20 DIAGNOSIS — F17210 Nicotine dependence, cigarettes, uncomplicated: Secondary | ICD-10-CM | POA: Insufficient documentation

## 2016-11-20 DIAGNOSIS — Z79899 Other long term (current) drug therapy: Secondary | ICD-10-CM | POA: Diagnosis not present

## 2016-11-20 DIAGNOSIS — E119 Type 2 diabetes mellitus without complications: Secondary | ICD-10-CM | POA: Insufficient documentation

## 2016-11-20 DIAGNOSIS — Z7982 Long term (current) use of aspirin: Secondary | ICD-10-CM | POA: Diagnosis not present

## 2016-11-20 DIAGNOSIS — K2951 Unspecified chronic gastritis with bleeding: Secondary | ICD-10-CM | POA: Diagnosis not present

## 2016-11-20 HISTORY — PX: ESOPHAGOGASTRODUODENOSCOPY (EGD) WITH PROPOFOL: SHX5813

## 2016-11-20 LAB — GLUCOSE, CAPILLARY: Glucose-Capillary: 90 mg/dL (ref 65–99)

## 2016-11-20 SURGERY — ESOPHAGOGASTRODUODENOSCOPY (EGD) WITH PROPOFOL
Anesthesia: General

## 2016-11-20 MED ORDER — MIDAZOLAM HCL 2 MG/2ML IJ SOLN
INTRAMUSCULAR | Status: AC
  Filled 2016-11-20: qty 2

## 2016-11-20 MED ORDER — PROPOFOL 10 MG/ML IV BOLUS
INTRAVENOUS | Status: DC | PRN
  Administered 2016-11-20: 100 mg via INTRAVENOUS

## 2016-11-20 MED ORDER — FENTANYL CITRATE (PF) 100 MCG/2ML IJ SOLN
INTRAMUSCULAR | Status: DC | PRN
Start: 2016-11-20 — End: 2016-11-20
  Administered 2016-11-20: 50 ug via INTRAVENOUS

## 2016-11-20 MED ORDER — MIDAZOLAM HCL 5 MG/5ML IJ SOLN
INTRAMUSCULAR | Status: DC | PRN
  Administered 2016-11-20: 1 mg via INTRAVENOUS

## 2016-11-20 MED ORDER — PHENYLEPHRINE HCL 10 MG/ML IJ SOLN
INTRAMUSCULAR | Status: DC | PRN
  Administered 2016-11-20: 100 ug via INTRAVENOUS

## 2016-11-20 MED ORDER — SODIUM CHLORIDE 0.9 % IV SOLN: INTRAVENOUS | Status: DC

## 2016-11-20 MED ORDER — FENTANYL CITRATE (PF) 100 MCG/2ML IJ SOLN
INTRAMUSCULAR | Status: AC
  Filled 2016-11-20: qty 2

## 2016-11-20 MED ORDER — PROPOFOL 500 MG/50ML IV EMUL
INTRAVENOUS | Status: DC | PRN
  Administered 2016-11-20: 140 ug/kg/min via INTRAVENOUS

## 2016-11-20 MED ORDER — SODIUM CHLORIDE 0.9 % IV SOLN
INTRAVENOUS | Status: DC
  Administered 2016-11-20: 1000 mL via INTRAVENOUS
  Administered 2016-11-20: 10:00:00 via INTRAVENOUS

## 2016-11-20 MED ORDER — PROPOFOL 500 MG/50ML IV EMUL
INTRAVENOUS | Status: AC
  Filled 2016-11-20: qty 50

## 2016-11-20 MED ORDER — LIDOCAINE 2% (20 MG/ML) 5 ML SYRINGE
INTRAMUSCULAR | Status: DC | PRN
  Administered 2016-11-20: 40 mg via INTRAVENOUS

## 2016-11-20 NOTE — Op Note (Signed)
[facility] Gastroenterology Patient Name: James [name] Procedure Date: 11/20/2016 10:15 AM MRN: [id number] Account #: [id number] Date of Birth: Nov 24, 1950 Admit Type: Outpatient Age: 66 [facility]OM 3 Gender: Male Note Status: Finalized Procedure:            Upper GI endoscopy Indications:          Epigastric abdominal pain Providers:            Manya Silvas, MD Referring MD:         [name], MD (Referring MD) Medicines:            Propofol per Anesthesia Complications:        No immediate complications. Procedure:            Pre-Anesthesia Assessment:                       - After reviewing the risks and benefits, the patient                        was deemed in satisfactory condition to undergo the                        procedure.                       After obtaining informed consent, the endoscope was                        passed under direct vision. Throughout the procedure,                        the patient's blood pressure, pulse, and oxygen                        saturations were monitored continuously. The                        Colonoscope was introduced through the mouth, and                        advanced to the second part of duodenum. The upper GI                        endoscopy was accomplished without difficulty. The                        patient tolerated the procedure well. Findings:      The examined esophagus was normal. There were tiny scattered pin point       spots in the esophagus. no blood and no active bleeding seen.      Diffuse severe inflammation characterized by erosions, erythema and       granularity was found in the gastric body. Biopsies were taken with a       cold forceps for histology. Biopsies were taken with a cold forceps for       Helicobacter pylori testing. There may likely be a component of portal       hypertensive gastropathy.      A few small angioectasias without bleeding were found  in the duodenal       bulb. Impression:           -  Normal esophagus.                       - Acute chronic gastritis. Biopsied.                       - Normal examined duodenum. Recommendation:       - Await pathology results. Manya Silvas, MD 11/20/2016 10:43:40 AM This report has been signed electronically. Number of Addenda: 0 Note Initiated On: 11/20/2016 10:15 AM      [facility]

## 2016-11-20 NOTE — Transfer of Care (Signed)
Immediate Anesthesia Transfer of Care Note  Patient: James [name]  Procedure(s) Performed: Procedure(s): ESOPHAGOGASTRODUODENOSCOPY (EGD) WITH PROPOFOL (N/A)  Patient Location: PACU and Endoscopy Unit  Anesthesia Type:General  Level of Consciousness: sedated  Airway & Oxygen Therapy: Patient Spontanous Breathing and Patient connected to nasal cannula oxygen  Post-op Assessment: Report given to RN and Post -op Vital signs reviewed and stable  Post vital signs: Reviewed and stable  Last Vitals:  Vitals:   11/20/16 0922  Pulse: (!) 57  Resp: 16  Temp: (!) 35.6 C    Last Pain:  Vitals:   11/20/16 0922  TempSrc: Tympanic  PainSc: 3          Complications: No apparent anesthesia complications

## 2016-11-20 NOTE — Anesthesia Postprocedure Evaluation (Signed)
Anesthesia Post Note  Patient: James [name]  Procedure(s) Performed: Procedure(s) (LRB): ESOPHAGOGASTRODUODENOSCOPY (EGD) WITH PROPOFOL (N/A)  Patient location during evaluation: Endoscopy Anesthesia Type: General Level of consciousness: awake and alert Pain management: pain level controlled Vital Signs Assessment: post-procedure vital signs reviewed and stable Respiratory status: spontaneous breathing, nonlabored ventilation, respiratory function stable and patient connected to nasal cannula oxygen Cardiovascular status: blood pressure returned to baseline and stable Postop Assessment: no signs of nausea or vomiting Anesthetic complications: no     Last Vitals:  Vitals:   11/20/16 1113 11/20/16 1123  BP: 132/60 (!) 131/56  Pulse: (!) 53 (!) 52  Resp: 16 17  Temp:      Last Pain:  Vitals:   11/20/16 1043  TempSrc: Tympanic  PainSc:                  Martha Clan

## 2016-11-20 NOTE — Anesthesia Preprocedure Evaluation (Signed)
Anesthesia Evaluation  Patient identified by MRN, date of birth, ID band Patient awake    Reviewed: Allergy & Precautions, H&P , NPO status , Patient's Chart, lab work & pertinent test results, reviewed documented beta blocker date and time   History of Anesthesia Complications Negative for: history of anesthetic complications  Airway Mallampati: II  TM Distance: >3 FB Neck ROM: full    Dental  (+) Upper Dentures, Lower Dentures, Edentulous Upper, Edentulous Lower   Pulmonary neg shortness of breath, neg COPD, neg recent URI, Current Smoker,           Cardiovascular Exercise Tolerance: Good hypertension, (-) angina(-) CAD, (-) Past MI, (-) Cardiac Stents and (-) CABG (-) dysrhythmias (-) Valvular Problems/Murmurs     Neuro/Psych negative neurological ROS  negative psych ROS   GI/Hepatic negative GI ROS, Neg liver ROS,   Endo/Other  diabetes  Renal/GU negative Renal ROS  negative genitourinary   Musculoskeletal   Abdominal   Peds  Hematology CLL   Anesthesia Other Findings Past Medical History: No date: CLL (chronic lymphocytic leukemia) ([facility]) No date: Diabetes mellitus without complication ([facility]) No date: Hematuria No date: Hypertension   Reproductive/Obstetrics negative OB ROS                             Anesthesia Physical Anesthesia Plan  ASA: III  Anesthesia Plan: General   Post-op Pain Management:    Induction: Intravenous  PONV Risk Score and Plan: 1 and Propofol  Airway Management Planned: Natural Airway and Nasal Cannula  Additional Equipment:   Intra-op Plan:   Post-operative Plan:   Informed Consent: I have reviewed the patients History and Physical, chart, labs and discussed the procedure including the risks, benefits and alternatives for the proposed anesthesia with the patient or authorized representative who has indicated his/her understanding and acceptance.    Dental Advisory Given  Plan Discussed with: Anesthesiologist, CRNA and Surgeon  Anesthesia Plan Comments:         Anesthesia Quick Evaluation

## 2016-11-20 NOTE — H&P (Signed)
   Primary Care Physician:  [name], MD Primary Gastroenterologist:  Dr. Vira Agar  Pre-Procedure History & Physical: HPI:  James [name] is a 66 y.o. male is here for an endoscopy.   Past Medical History:  Diagnosis Date  . CLL (chronic lymphocytic leukemia) ([place])   . Diabetes mellitus without complication ([place])   . Hematuria   . Hypertension     Past Surgical History:  Procedure Laterality Date  . CHOLECYSTECTOMY  1983    Prior to Admission medications   Medication Sig Start Date End Date Taking? Authorizing Provider  amLODipine (NORVASC) 5 MG tablet Take 1 tablet (5 mg total) by mouth daily. 11/15/16  Yes Cammie Sickle, MD  aspirin EC 81 MG tablet Take 81 mg by mouth daily.   Yes [provider]  Cyanocobalamin (B-12 COMPLIANCE INJECTION) 1000 MCG/ML KIT Inject 1 mL as directed every 4 (four) months.   Yes [provider]  Cyanocobalamin (RA VITAMIN B-12 TR) 1000 MCG TBCR Take by mouth.   Yes [provider]  Ibrutinib (IMBRUVICA) 280 MG TABS Take 1 capsule by mouth daily. 08/16/16  Yes Cammie Sickle, MD  lisinopril (PRINIVIL,ZESTRIL) 30 MG tablet Take 30 mg by mouth daily.  02/29/16  Yes [provider]  metFORMIN (GLUMETZA) 1000 MG (MOD) 24 hr tablet Take 1,000 mg by mouth 2 (two) times daily.   Yes [provider]  oxyCODONE-acetaminophen (PERCOCET/ROXICET) 5-325 MG tablet 1 pill every 8-12 hours 11/05/16  Yes Cammie Sickle, MD    Allergies as of 09/12/2016  . (No Known Allergies)    Family History  Problem Relation Age of Onset  . Hypertension Sister     Social History   Social History  . Marital status: Married    Spouse name: N/A  . Number of children: N/A  . Years of education: N/A   Occupational History  . Not on file.   Social History Main Topics  . Smoking status: Current Every Day Smoker    Packs/day: 0.50    Years: 46.00    Types: Cigarettes  . Smokeless tobacco: Never Used   . Alcohol use 3.0 oz/week    5 Glasses of wine per week     Comment: 5 a week  . Drug use: No  . Sexual activity: No   Other Topics Concern  . Not on file   Social History Narrative  . No narrative on file    Review of Systems: See HPI, otherwise negative ROS  Physical Exam: Pulse (!) 57   Temp (!) 96.1 F (35.6 C) (Tympanic)   Resp 16   Ht '5\' 8"'$  (1.727 m)   Wt 90.3 kg (199 lb)   SpO2 100%   BMI 30.26 kg/m  General:   Alert,  pleasant and cooperative in NAD Head:  Normocephalic and atraumatic. Neck:  Supple; no masses or thyromegaly. Lungs:  Clear throughout to auscultation.    Heart:  Regular rate and rhythm. Abdomen:  Soft, nontender and nondistended. Normal bowel sounds, without guarding, and without rebound.   Neurologic:  Alert and  oriented x4;  grossly normal neurologically.  Impression/Plan: James [name] is here for an endoscopy to be performed for epigastric abdominal pain.  Risks, benefits, limitations, and alternatives regarding  endoscopy have been reviewed with the patient.  Questions have been answered.  All parties agreeable.   Gaylyn Cheers, MD  11/20/2016, 10:21 AM

## 2016-11-20 NOTE — Anesthesia Post-op Follow-up Note (Cosign Needed)
Anesthesia QCDR form completed.        

## 2016-11-21 ENCOUNTER — Encounter: Payer: Self-pay | Admitting: Unknown Physician Specialty

## 2016-11-21 LAB — SURGICAL PATHOLOGY

## 2016-11-25 ENCOUNTER — Telehealth: Payer: Self-pay | Admitting: *Deleted

## 2016-11-25 DIAGNOSIS — C8300 Small cell B-cell lymphoma, unspecified site: Secondary | ICD-10-CM

## 2016-11-25 DIAGNOSIS — C9112 Chronic lymphocytic leukemia of B-cell type in relapse: Secondary | ICD-10-CM

## 2016-11-25 DIAGNOSIS — C9192 Lymphoid leukemia, unspecified, in relapse: Secondary | ICD-10-CM

## 2016-11-25 MED ORDER — OXYCODONE-ACETAMINOPHEN 5-325 MG PO TABS: ORAL_TABLET | ORAL | 0 refills | Status: DC

## 2016-11-26 NOTE — Telephone Encounter (Signed)
Prescription refill completed

## 2016-12-03 ENCOUNTER — Telehealth: Payer: Self-pay | Admitting: *Deleted

## 2016-12-03 DIAGNOSIS — Z122 Encounter for screening for malignant neoplasm of respiratory organs: Secondary | ICD-10-CM

## 2016-12-03 DIAGNOSIS — Z87891 Personal history of nicotine dependence: Secondary | ICD-10-CM

## 2016-12-03 NOTE — Telephone Encounter (Signed)
Received referral for initial lung cancer screening scan. Contacted patient and obtained smoking history,(current, 47 pack year) as well as answering questions related to screening process. Patient denies signs of lung cancer such as weight loss or hemoptysis. Patient denies comorbidity that would prevent curative treatment if lung cancer were found. Patient is scheduled for shared decision making visit and CT scan on 12/11/16.

## 2016-12-09 ENCOUNTER — Other Ambulatory Visit: Payer: Self-pay | Admitting: *Deleted

## 2016-12-09 DIAGNOSIS — C9112 Chronic lymphocytic leukemia of B-cell type in relapse: Secondary | ICD-10-CM

## 2016-12-09 DIAGNOSIS — C9192 Lymphoid leukemia, unspecified, in relapse: Secondary | ICD-10-CM

## 2016-12-09 DIAGNOSIS — C8300 Small cell B-cell lymphoma, unspecified site: Secondary | ICD-10-CM

## 2016-12-09 MED ORDER — OXYCODONE-ACETAMINOPHEN 5-325 MG PO TABS: ORAL_TABLET | ORAL | 0 refills | Status: DC

## 2016-12-11 ENCOUNTER — Ambulatory Visit
Admission: RE | Admit: 2016-12-11 | Discharge: 2016-12-11 | Disposition: A | Discharge status: Home / Self Care | Payer: Medicare Other | Source: Ambulatory Visit | Attending: Oncology | Admitting: Oncology

## 2016-12-11 ENCOUNTER — Inpatient Hospital Stay: Payer: Medicare Other | Attending: Oncology | Admitting: Oncology

## 2016-12-11 ENCOUNTER — Encounter: Payer: Self-pay | Admitting: Oncology

## 2016-12-11 DIAGNOSIS — Z122 Encounter for screening for malignant neoplasm of respiratory organs: Secondary | ICD-10-CM | POA: Diagnosis not present

## 2016-12-11 DIAGNOSIS — R634 Abnormal weight loss: Secondary | ICD-10-CM | POA: Insufficient documentation

## 2016-12-11 DIAGNOSIS — G8929 Other chronic pain: Secondary | ICD-10-CM | POA: Insufficient documentation

## 2016-12-11 DIAGNOSIS — E1122 Type 2 diabetes mellitus with diabetic chronic kidney disease: Secondary | ICD-10-CM | POA: Insufficient documentation

## 2016-12-11 DIAGNOSIS — I129 Hypertensive chronic kidney disease with stage 1 through stage 4 chronic kidney disease, or unspecified chronic kidney disease: Secondary | ICD-10-CM | POA: Insufficient documentation

## 2016-12-11 DIAGNOSIS — I700 Atherosclerosis of aorta: Secondary | ICD-10-CM | POA: Insufficient documentation

## 2016-12-11 DIAGNOSIS — K2970 Gastritis, unspecified, without bleeding: Secondary | ICD-10-CM | POA: Insufficient documentation

## 2016-12-11 DIAGNOSIS — R1013 Epigastric pain: Secondary | ICD-10-CM | POA: Insufficient documentation

## 2016-12-11 DIAGNOSIS — Z7984 Long term (current) use of oral hypoglycemic drugs: Secondary | ICD-10-CM | POA: Insufficient documentation

## 2016-12-11 DIAGNOSIS — F17210 Nicotine dependence, cigarettes, uncomplicated: Secondary | ICD-10-CM | POA: Insufficient documentation

## 2016-12-11 DIAGNOSIS — L089 Local infection of the skin and subcutaneous tissue, unspecified: Secondary | ICD-10-CM | POA: Insufficient documentation

## 2016-12-11 DIAGNOSIS — N182 Chronic kidney disease, stage 2 (mild): Secondary | ICD-10-CM | POA: Insufficient documentation

## 2016-12-11 DIAGNOSIS — J439 Emphysema, unspecified: Secondary | ICD-10-CM | POA: Insufficient documentation

## 2016-12-11 DIAGNOSIS — Z87891 Personal history of nicotine dependence: Secondary | ICD-10-CM | POA: Insufficient documentation

## 2016-12-11 DIAGNOSIS — N400 Enlarged prostate without lower urinary tract symptoms: Secondary | ICD-10-CM | POA: Insufficient documentation

## 2016-12-11 DIAGNOSIS — K5730 Diverticulosis of large intestine without perforation or abscess without bleeding: Secondary | ICD-10-CM | POA: Insufficient documentation

## 2016-12-11 DIAGNOSIS — Z79899 Other long term (current) drug therapy: Secondary | ICD-10-CM | POA: Insufficient documentation

## 2016-12-11 DIAGNOSIS — Z9221 Personal history of antineoplastic chemotherapy: Secondary | ICD-10-CM | POA: Insufficient documentation

## 2016-12-11 DIAGNOSIS — I2510 Atherosclerotic heart disease of native coronary artery without angina pectoris: Secondary | ICD-10-CM | POA: Insufficient documentation

## 2016-12-11 DIAGNOSIS — Z7982 Long term (current) use of aspirin: Secondary | ICD-10-CM | POA: Insufficient documentation

## 2016-12-11 DIAGNOSIS — C9192 Lymphoid leukemia, unspecified, in relapse: Secondary | ICD-10-CM | POA: Insufficient documentation

## 2016-12-11 NOTE — Progress Notes (Signed)
In accordance with CMS guidelines, patient has met eligibility criteria including age, absence of signs or symptoms of lung cancer.  Social History  Substance Use Topics  . Smoking status: Current Every Day Smoker    Packs/day: 1.00    Years: 47.00    Types: Cigarettes  . Smokeless tobacco: Never Used  . Alcohol use 3.0 oz/week    5 Glasses of wine per week     Comment: 5 a week     A shared decision-making session was conducted prior to the performance of CT scan. This includes one or more decision aids, includes benefits and harms of screening, follow-up diagnostic testing, over-diagnosis, false positive rate, and total radiation exposure.  Counseling on the importance of adherence to annual lung cancer LDCT screening, impact of co-morbidities, and ability or willingness to undergo diagnosis and treatment is imperative for compliance of the program.  Counseling on the importance of continued smoking cessation for former smokers; the importance of smoking cessation for current smokers, and information about tobacco cessation interventions have been given to patient including [place] and [facility] quit [place] programs.  Written order for lung cancer screening with LDCT has been given to the patient and any and all questions have been answered to the best of my abilities.   Yearly follow up will be coordinated by Burgess Estelle, Thoracic Navigator.  Faythe Casa, NP 12/11/2016 9:17 AM

## 2016-12-12 ENCOUNTER — Encounter: Payer: Self-pay | Admitting: *Deleted

## 2016-12-12 DIAGNOSIS — K31811 Angiodysplasia of stomach and duodenum with bleeding: Secondary | ICD-10-CM | POA: Insufficient documentation

## 2016-12-12 DIAGNOSIS — D696 Thrombocytopenia, unspecified: Secondary | ICD-10-CM | POA: Insufficient documentation

## 2016-12-13 ENCOUNTER — Inpatient Hospital Stay (HOSPITAL_BASED_OUTPATIENT_CLINIC_OR_DEPARTMENT_OTHER): Payer: Medicare Other | Admitting: Internal Medicine

## 2016-12-13 VITALS — BP 181/82 | HR 73 | Temp 98.2°F | Wt 193.4 lb

## 2016-12-13 DIAGNOSIS — N400 Enlarged prostate without lower urinary tract symptoms: Secondary | ICD-10-CM

## 2016-12-13 DIAGNOSIS — I129 Hypertensive chronic kidney disease with stage 1 through stage 4 chronic kidney disease, or unspecified chronic kidney disease: Secondary | ICD-10-CM | POA: Diagnosis not present

## 2016-12-13 DIAGNOSIS — Z9221 Personal history of antineoplastic chemotherapy: Secondary | ICD-10-CM | POA: Diagnosis not present

## 2016-12-13 DIAGNOSIS — Z79899 Other long term (current) drug therapy: Secondary | ICD-10-CM | POA: Diagnosis not present

## 2016-12-13 DIAGNOSIS — R1013 Epigastric pain: Secondary | ICD-10-CM | POA: Diagnosis not present

## 2016-12-13 DIAGNOSIS — K5730 Diverticulosis of large intestine without perforation or abscess without bleeding: Secondary | ICD-10-CM | POA: Diagnosis not present

## 2016-12-13 DIAGNOSIS — I700 Atherosclerosis of aorta: Secondary | ICD-10-CM

## 2016-12-13 DIAGNOSIS — E1122 Type 2 diabetes mellitus with diabetic chronic kidney disease: Secondary | ICD-10-CM

## 2016-12-13 DIAGNOSIS — R634 Abnormal weight loss: Secondary | ICD-10-CM

## 2016-12-13 DIAGNOSIS — F17210 Nicotine dependence, cigarettes, uncomplicated: Secondary | ICD-10-CM | POA: Diagnosis not present

## 2016-12-13 DIAGNOSIS — G8929 Other chronic pain: Secondary | ICD-10-CM

## 2016-12-13 DIAGNOSIS — K2970 Gastritis, unspecified, without bleeding: Secondary | ICD-10-CM

## 2016-12-13 DIAGNOSIS — Z7982 Long term (current) use of aspirin: Secondary | ICD-10-CM

## 2016-12-13 DIAGNOSIS — C8300 Small cell B-cell lymphoma, unspecified site: Secondary | ICD-10-CM

## 2016-12-13 DIAGNOSIS — N182 Chronic kidney disease, stage 2 (mild): Secondary | ICD-10-CM | POA: Diagnosis not present

## 2016-12-13 DIAGNOSIS — Z7984 Long term (current) use of oral hypoglycemic drugs: Secondary | ICD-10-CM | POA: Diagnosis not present

## 2016-12-13 DIAGNOSIS — L089 Local infection of the skin and subcutaneous tissue, unspecified: Secondary | ICD-10-CM

## 2016-12-13 DIAGNOSIS — I2510 Atherosclerotic heart disease of native coronary artery without angina pectoris: Secondary | ICD-10-CM

## 2016-12-13 DIAGNOSIS — C9112 Chronic lymphocytic leukemia of B-cell type in relapse: Secondary | ICD-10-CM

## 2016-12-13 DIAGNOSIS — C9192 Lymphoid leukemia, unspecified, in relapse: Secondary | ICD-10-CM

## 2016-12-13 MED ORDER — CLINDAMYCIN HCL 300 MG PO CAPS
300.0000 mg | ORAL_CAPSULE | Freq: Four times a day (QID) | ORAL | 0 refills | Status: DC
Start: 2016-12-13 — End: 2017-01-13

## 2016-12-13 MED ORDER — AMLODIPINE BESYLATE 10 MG PO TABS: 10.0000 mg | ORAL_TABLET | Freq: Every day | ORAL | 3 refills | Status: DC

## 2016-12-13 MED ORDER — LISINOPRIL-HYDROCHLOROTHIAZIDE 20-25 MG PO TABS: 1.0000 | ORAL_TABLET | Freq: Every day | ORAL | 4 refills | Status: DC

## 2016-12-13 NOTE — Progress Notes (Signed)
Patient is here today for a follow up. Patient reportshaving abdominal pain (pain scale 2/10 right now), and little to no appetite.

## 2016-12-13 NOTE — Progress Notes (Signed)
[place] OFFICE PROGRESS NOTE  Patient Care Team: [name], MD as PCP - General (Internal Medicine)   SUMMARY OF ONCOLOGIC HISTORY: Oncology History   # 2006- CLL STAGE IV; MAY 2011- WBC- 57K;Platelets-99;Hb-12/CT Bulky LN; BMBx- 80% Invol; del 11; START Benda-Ritux x4 [finished Sep 2011];   # July 2015-Progression; Sep 2015-START ibrutinib; CT scan DEC 2015- Improvement LN; Cont Ibrutinib '140mg'$ /d; NOV 2016 CT- 1-2CM LN [mild progression compared to Dec 2015];NOV 2016- FISH peripheral blood- NO MUTATIONS/CD-38 Positive; NOV 7th- CONT IBRUTINIB 2 pills/day; CT AUG 2017- STABLE;  DEC 6th PET- Mild RP LN/ Retrocrural LN   SA skin infection [Oct 2016] s/p clinda     CLL (chronic lymphoid leukemia) in relapse [facility])     INTERVAL HISTORY:  66 year old male patient with history of relapsed CLL currently on second line therapy ibrutinib since September 2015 for follow-up; patient is currently on 2 pills of ibrutinib [secondary to intolerance]- is here for follow-up.  In the interim patient had been evaluated by GI-for worsening abdominal pain- EGD shows gastritis changes but no malignant changes.  Continues to have intermittent abdominal pain. Continues pain oxycodone. He again has lost weight. He also notes to have "multiple blisters" on his face/ torso. He has been losing weight. Complains of early satiety.  No night sweats. No recent infections or fevers. No diarrhea. Denies any palpitations or syncopal episodes. No bleeding.  No nausea no vomiting. Denies any easy bruising or bleeding.  REVIEW OF SYSTEMS:  A complete 10 point review of system is done which is negative except mentioned above/history of present illness.   PAST MEDICAL HISTORY :  Past Medical History:  Diagnosis Date  . CLL (chronic lymphocytic leukemia) ([place])   . Diabetes mellitus without complication ([place])   . Hematuria   . Hypertension     PAST SURGICAL HISTORY :   Past Surgical History:   Procedure Laterality Date  . CHOLECYSTECTOMY  1983  . ESOPHAGOGASTRODUODENOSCOPY (EGD) WITH PROPOFOL N/A 11/20/2016   Procedure: ESOPHAGOGASTRODUODENOSCOPY (EGD) WITH PROPOFOL;  Surgeon: Manya Silvas, MD;  Location: [facility] ENDOSCOPY;  Service: Endoscopy;  Laterality: N/A;    FAMILY HISTORY :   Family History  Problem Relation Age of Onset  . Hypertension Sister     SOCIAL HISTORY:   Social History  Substance Use Topics  . Smoking status: Current Every Day Smoker    Packs/day: 1.00    Years: 47.00    Types: Cigarettes  . Smokeless tobacco: Never Used  . Alcohol use 3.0 oz/week    5 Glasses of wine per week     Comment: 5 a week    ALLERGIES:  has No Known Allergies.  MEDICATIONS:  Current Outpatient Prescriptions  Medication Sig Dispense Refill  . amLODipine (NORVASC) 10 MG tablet Take 1 tablet (10 mg total) by mouth daily. 30 tablet 3  . aspirin EC 81 MG tablet Take 81 mg by mouth daily.    . Cyanocobalamin (B-12 COMPLIANCE INJECTION) 1000 MCG/ML KIT Inject 1 mL as directed every 4 (four) months.    . Cyanocobalamin (RA VITAMIN B-12 TR) 1000 MCG TBCR Take by mouth.    . Ibrutinib (IMBRUVICA) 280 MG TABS Take 1 capsule by mouth daily. 30 tablet 4  . metFORMIN (GLUMETZA) 1000 MG (MOD) 24 hr tablet Take 1,000 mg by mouth 2 (two) times daily.    Marland Kitchen oxyCODONE-acetaminophen (PERCOCET/ROXICET) 5-325 MG tablet 1 pill every 8-12 hours 60 tablet 0  . clindamycin (CLEOCIN) 300 MG capsule  Take 1 capsule (300 mg total) by mouth 4 (four) times daily. 40 capsule 0  . lisinopril-hydrochlorothiazide (PRINZIDE,ZESTORETIC) 20-25 MG tablet Take 1 tablet by mouth daily. 30 tablet 4   No current facility-administered medications for this visit.     PHYSICAL EXAMINATION: ECOG PERFORMANCE STATUS: 0 - Asymptomatic  BP (!) 181/82   Pulse 73   Temp 98.2 F (36.8 C) (Tympanic)   Wt 193 lb 6.4 oz (87.7 kg)   BMI 29.41 kg/m   Filed Weights   12/13/16 1450  Weight: 193 lb 6.4 oz (87.7 kg)     GENERAL: Well-nourished well-developed; Alert, no distress and comfortable.   Alone. EYES: no pallor or icterus OROPHARYNX: no thrush or ulceration; good dentition  NECK: supple, no masses felt LYMPH:  no palpable lymphadenopathy in the cervical, axillary or inguinal regions LUNGS: clear to auscultation and  No wheeze or crackles HEART/CVS: regular rate & rhythm and no murmurs; No lower extremity edema ABDOMEN:abdomen soft, non-tender and normal bowel sounds.  Musculoskeletal:no cyanosis of digits and no clubbing  PSYCH: alert & oriented x 3 with fluent speech NEURO: no focal motor/sensory deficits SKIN:   No rash.  LABORATORY DATA:  I have reviewed the data as listed    Component Value Date/Time   NA 137 11/15/2016 1345   NA 142 05/03/2014 1139   K 4.7 11/15/2016 1345   K 4.7 05/03/2014 1139   CL 107 11/15/2016 1345   CL 110 (H) 05/03/2014 1139   CO2 25 11/15/2016 1345   CO2 24 05/03/2014 1139   GLUCOSE 109 (H) 11/15/2016 1345   GLUCOSE 98 05/03/2014 1139   BUN 21 (H) 11/15/2016 1345   BUN 20 (H) 05/03/2014 1139   CREATININE 1.32 (H) 11/15/2016 1345   CREATININE 1.22 08/09/2014 1122   CALCIUM 8.8 (L) 11/15/2016 1345   CALCIUM 8.6 05/03/2014 1139   PROT 7.0 11/15/2016 1345   PROT 7.5 05/03/2014 1139   ALBUMIN 4.2 11/15/2016 1345   ALBUMIN 4.1 05/03/2014 1139   AST 18 11/15/2016 1345   AST 15 05/03/2014 1139   ALT 12 (L) 11/15/2016 1345   ALT 26 05/03/2014 1139   ALKPHOS 74 11/15/2016 1345   ALKPHOS 94 05/03/2014 1139   BILITOT 0.5 11/15/2016 1345   BILITOT 0.5 05/03/2014 1139   GFRNONAA 54 (L) 11/15/2016 1345   GFRNONAA >60 08/09/2014 1122   GFRAA >60 11/15/2016 1345   GFRAA >60 08/09/2014 1122    No results found for: SPEP, UPEP  Lab Results  Component Value Date   WBC 10.8 (H) 11/15/2016   NEUTROABS 4.2 11/15/2016   HGB 10.4 (L) 11/15/2016   HCT 31.3 (L) 11/15/2016   MCV 99.3 11/15/2016   PLT 107 (L) 11/15/2016      Chemistry      Component  Value Date/Time   NA 137 11/15/2016 1345   NA 142 05/03/2014 1139   K 4.7 11/15/2016 1345   K 4.7 05/03/2014 1139   CL 107 11/15/2016 1345   CL 110 (H) 05/03/2014 1139   CO2 25 11/15/2016 1345   CO2 24 05/03/2014 1139   BUN 21 (H) 11/15/2016 1345   BUN 20 (H) 05/03/2014 1139   CREATININE 1.32 (H) 11/15/2016 1345   CREATININE 1.22 08/09/2014 1122      Component Value Date/Time   CALCIUM 8.8 (L) 11/15/2016 1345   CALCIUM 8.6 05/03/2014 1139   ALKPHOS 74 11/15/2016 1345   ALKPHOS 94 05/03/2014 1139   AST 18 11/15/2016 1345   AST  15 05/03/2014 1139   ALT 12 (L) 11/15/2016 1345   ALT 26 05/03/2014 1139   BILITOT 0.5 11/15/2016 1345   BILITOT 0.5 05/03/2014 1139     IMPRESSION: 1. Mildly enlarged and mildly hypermetabolic retrocaval lymphadenopathy, mildly increased in size since 12/19/2015 CT study, consistent with metabolically active low grade lymphoma. 2. No additional sites of metabolically active lymphadenopathy. 3. Relatively uniform low level marrow hypermetabolism throughout the visualized skeleton, which is nonspecific and may indicate metabolically active leukemia. 4. Additional findings include aortic atherosclerosis, left main and 3 vessel coronary atherosclerosis, minimal sigmoid diverticulosis and mild prostatomegaly.   Electronically Signed   By: Ilona Sorrel M.D.   On: 04/02/2016 13:16    ASSESSMENT & PLAN:  CLL (chronic lymphoid leukemia) in relapse ([place]) # CLL/SLL- relapsed currently on second line therapy with ibrutinib September 2016. DEC 2017- PET small 1 cm LN RP/retrocrural LN. ? clinical progression. Patient tolerating ibrutinib fairly well except for poorly controlled blood pressures [see discussion below]  # Continue Ibrutinib 280 mg/day; mild anemia hemoglobin 10.5 platelets - 107 secondary to ibrutinib. However given the abdominal pain/weight loss would recommend- PET scan for further evaluation.  # Elevated Blood pressure- Poorly  controlledsec to ibrutinib. Add norvasc 10 mg/day; new script for lisinopril-HCTZ 20-25 sent to pharmacy. Recommend blood pressure cuff.   # Skin infection/? Folliculitis-  recommend clindamycin 300 QID x10 days  # Anemia/CKD- II/ SLL-] stable.   # Chronic Epigastric Abdominal pain- not improved;s/p EGD [[name]]; still on percocet BID. Reviewed the note from GI.   # follow up with me in 4 weeks// labs;  PET prior.     Cammie Sickle, MD 12/13/2016 4:25 PM

## 2016-12-13 NOTE — Assessment & Plan Note (Addendum)
#   CLL/SLL- relapsed currently on second line therapy with ibrutinib September 2016. DEC 2017- PET small 1 cm LN RP/retrocrural LN. ? clinical progression. Patient tolerating ibrutinib fairly well except for poorly controlled blood pressures [see discussion below]  # Continue Ibrutinib 280 mg/day; mild anemia hemoglobin 10.5 platelets - 107 secondary to ibrutinib. However given the abdominal pain/weight loss would recommend- PET scan for further evaluation.  # Elevated Blood pressure- Poorly controlledsec to ibrutinib. Add norvasc 10 mg/day; new script for lisinopril-HCTZ 20-25 sent to pharmacy. Recommend blood pressure cuff.   # Skin infection/? Folliculitis-  recommend clindamycin 300 QID x10 days  # Anemia/CKD- II/ SLL-] stable.   # Chronic Epigastric Abdominal pain- not improved;s/p EGD [[name]]; still on percocet BID. Reviewed the note from GI.   # follow up with me in 4 weeks// labs;  PET prior.

## 2016-12-31 ENCOUNTER — Other Ambulatory Visit: Payer: Self-pay | Admitting: *Deleted

## 2016-12-31 DIAGNOSIS — C9192 Lymphoid leukemia, unspecified, in relapse: Secondary | ICD-10-CM

## 2016-12-31 DIAGNOSIS — C8300 Small cell B-cell lymphoma, unspecified site: Secondary | ICD-10-CM

## 2016-12-31 DIAGNOSIS — C9112 Chronic lymphocytic leukemia of B-cell type in relapse: Secondary | ICD-10-CM

## 2017-01-01 MED ORDER — OXYCODONE-ACETAMINOPHEN 5-325 MG PO TABS: ORAL_TABLET | ORAL | 0 refills | Status: DC

## 2017-01-03 ENCOUNTER — Other Ambulatory Visit: Payer: Self-pay | Admitting: *Deleted

## 2017-01-03 DIAGNOSIS — C9112 Chronic lymphocytic leukemia of B-cell type in relapse: Secondary | ICD-10-CM

## 2017-01-03 DIAGNOSIS — C9192 Lymphoid leukemia, unspecified, in relapse: Secondary | ICD-10-CM

## 2017-01-03 MED ORDER — IBRUTINIB 280 MG PO TABS
1.0000 | ORAL_TABLET | ORAL | 6 refills | Status: DC
Start: 2017-01-03 — End: 2017-01-07

## 2017-01-06 ENCOUNTER — Telehealth: Payer: Self-pay | Admitting: Pharmacist

## 2017-01-06 DIAGNOSIS — C9112 Chronic lymphocytic leukemia of B-cell type in relapse: Secondary | ICD-10-CM

## 2017-01-06 DIAGNOSIS — C9192 Lymphoid leukemia, unspecified, in relapse: Secondary | ICD-10-CM

## 2017-01-06 NOTE — Telephone Encounter (Signed)
Oral Oncology Pharmacist Encounter  Received refill prescription for ibrutinib for the treatment of CLL, planned duration until disease progression or unacceptable drug toxicity. Medication started 12/2013.  CMP/CBC from 11/15/16 assessed, no relevant lab abnormalities. Prescription dose and frequency assessed.   Current medication list in Epic reviewed, no relevant DDIs with ibrutinib identified.  Prescription has been e-scribed to the [facility] and we will see if the insurance company will let it be filled there.  Oral Oncology Clinic will continue to follow patient.   Darl Pikes, PharmD, BCPS Hematology/Oncology Clinical Pharmacist [facility] [phone]  01/06/2017 1:08 PM

## 2017-01-07 ENCOUNTER — Ambulatory Visit
Admission: RE | Admit: 2017-01-07 | Discharge: 2017-01-07 | Disposition: A | Discharge status: Home / Self Care | Payer: Medicare Other | Source: Ambulatory Visit | Attending: Internal Medicine | Admitting: Internal Medicine

## 2017-01-07 ENCOUNTER — Other Ambulatory Visit: Payer: Self-pay | Admitting: *Deleted

## 2017-01-07 DIAGNOSIS — Z7984 Long term (current) use of oral hypoglycemic drugs: Secondary | ICD-10-CM | POA: Diagnosis not present

## 2017-01-07 DIAGNOSIS — Z7982 Long term (current) use of aspirin: Secondary | ICD-10-CM | POA: Diagnosis not present

## 2017-01-07 DIAGNOSIS — Z79899 Other long term (current) drug therapy: Secondary | ICD-10-CM | POA: Diagnosis not present

## 2017-01-07 DIAGNOSIS — C8300 Small cell B-cell lymphoma, unspecified site: Secondary | ICD-10-CM | POA: Diagnosis present

## 2017-01-07 DIAGNOSIS — R918 Other nonspecific abnormal finding of lung field: Secondary | ICD-10-CM | POA: Insufficient documentation

## 2017-01-07 LAB — GLUCOSE, CAPILLARY: GLUCOSE-CAPILLARY: 123 mg/dL — AB (ref 65–99)

## 2017-01-07 MED ORDER — IBRUTINIB 280 MG PO TABS: 1.0000 | ORAL_TABLET | ORAL | 6 refills | Status: DC

## 2017-01-07 MED ORDER — FLUDEOXYGLUCOSE F - 18 (FDG) INJECTION
12.0000 | Freq: Once | INTRAVENOUS | Status: AC | PRN
  Administered 2017-01-07: 12.46 via INTRAVENOUS

## 2017-01-07 MED FILL — IMBRUVICA 280 MG TAB: 280 | 28 days supply | Qty: 28 | Fill #0

## 2017-01-07 NOTE — Telephone Encounter (Addendum)
Oral Chemotherapy Pharmacist Encounter  I spoke with patient for overview of refill prescription for ibrutinibfor the treatment of CLL in planned duration of until disease progression or unacceptable drug toxicity. Medication started 12/2013.  Pt was previously filling at an outside pharmacy but the medication is now being filled at [facility]  Pt is doing well. Reviewed with patient administration, dosing, side effects, safe handling, disposal, drug-food interactions, and monitoring. Patient will take 1 capsule (280mg ) by mouth daily.  Medication will be mailed out today.   Side effects include but not limited to: rash, N/V/D, fatigue, bruising, abdominal cramping.     Reviewed with patient importance of keeping a medication schedule and plan for any missed doses. Patient stated in the past month that he missed maybe one day of medication.   Mr. Colden voiced understanding and appreciation. All questions answered.  Patient knows to call the office with questions or concerns. Oral Oncology Clinic will continue to follow.  Thank you,  Darl Pikes, PharmD, BCPS Hematology/Oncology Clinical Pharmacist [facility] [phone]  01/07/2017 9:53 AM

## 2017-01-07 NOTE — Telephone Encounter (Signed)
Notified [facility] that this was sent to another pharmacy

## 2017-01-10 ENCOUNTER — Inpatient Hospital Stay: Payer: Medicare Other

## 2017-01-10 ENCOUNTER — Inpatient Hospital Stay: Payer: Medicare Other | Admitting: Internal Medicine

## 2017-01-13 ENCOUNTER — Inpatient Hospital Stay: Payer: Medicare Other

## 2017-01-13 ENCOUNTER — Inpatient Hospital Stay: Payer: Medicare Other | Attending: Internal Medicine | Admitting: Internal Medicine

## 2017-01-13 VITALS — BP 152/87 | HR 73 | Temp 97.6°F | Resp 20 | Ht 68.0 in | Wt 189.0 lb

## 2017-01-13 DIAGNOSIS — R634 Abnormal weight loss: Secondary | ICD-10-CM | POA: Insufficient documentation

## 2017-01-13 DIAGNOSIS — Z79899 Other long term (current) drug therapy: Secondary | ICD-10-CM | POA: Diagnosis not present

## 2017-01-13 DIAGNOSIS — Z7982 Long term (current) use of aspirin: Secondary | ICD-10-CM | POA: Diagnosis not present

## 2017-01-13 DIAGNOSIS — N182 Chronic kidney disease, stage 2 (mild): Secondary | ICD-10-CM | POA: Insufficient documentation

## 2017-01-13 DIAGNOSIS — I700 Atherosclerosis of aorta: Secondary | ICD-10-CM | POA: Insufficient documentation

## 2017-01-13 DIAGNOSIS — R630 Anorexia: Secondary | ICD-10-CM

## 2017-01-13 DIAGNOSIS — E1122 Type 2 diabetes mellitus with diabetic chronic kidney disease: Secondary | ICD-10-CM | POA: Insufficient documentation

## 2017-01-13 DIAGNOSIS — J439 Emphysema, unspecified: Secondary | ICD-10-CM

## 2017-01-13 DIAGNOSIS — I129 Hypertensive chronic kidney disease with stage 1 through stage 4 chronic kidney disease, or unspecified chronic kidney disease: Secondary | ICD-10-CM | POA: Diagnosis not present

## 2017-01-13 DIAGNOSIS — K5903 Drug induced constipation: Secondary | ICD-10-CM | POA: Diagnosis not present

## 2017-01-13 DIAGNOSIS — I2510 Atherosclerotic heart disease of native coronary artery without angina pectoris: Secondary | ICD-10-CM | POA: Insufficient documentation

## 2017-01-13 DIAGNOSIS — C8300 Small cell B-cell lymphoma, unspecified site: Secondary | ICD-10-CM

## 2017-01-13 DIAGNOSIS — T402X5A Adverse effect of other opioids, initial encounter: Secondary | ICD-10-CM | POA: Diagnosis not present

## 2017-01-13 DIAGNOSIS — Z9221 Personal history of antineoplastic chemotherapy: Secondary | ICD-10-CM | POA: Diagnosis not present

## 2017-01-13 DIAGNOSIS — C9112 Chronic lymphocytic leukemia of B-cell type in relapse: Secondary | ICD-10-CM | POA: Diagnosis not present

## 2017-01-13 DIAGNOSIS — Z7984 Long term (current) use of oral hypoglycemic drugs: Secondary | ICD-10-CM | POA: Insufficient documentation

## 2017-01-13 DIAGNOSIS — F17210 Nicotine dependence, cigarettes, uncomplicated: Secondary | ICD-10-CM | POA: Diagnosis not present

## 2017-01-13 DIAGNOSIS — R6881 Early satiety: Secondary | ICD-10-CM

## 2017-01-13 DIAGNOSIS — R1013 Epigastric pain: Secondary | ICD-10-CM | POA: Insufficient documentation

## 2017-01-13 DIAGNOSIS — N400 Enlarged prostate without lower urinary tract symptoms: Secondary | ICD-10-CM | POA: Diagnosis not present

## 2017-01-13 DIAGNOSIS — D631 Anemia in chronic kidney disease: Secondary | ICD-10-CM | POA: Insufficient documentation

## 2017-01-13 DIAGNOSIS — C9192 Lymphoid leukemia, unspecified, in relapse: Secondary | ICD-10-CM

## 2017-01-13 DIAGNOSIS — K5730 Diverticulosis of large intestine without perforation or abscess without bleeding: Secondary | ICD-10-CM

## 2017-01-13 LAB — CBC WITH DIFFERENTIAL/PLATELET
Basophils Absolute: 0.2 10*3/uL — ABNORMAL HIGH (ref 0–0.1)
Basophils Relative: 2 %
Eosinophils Absolute: 0.1 10*3/uL (ref 0–0.7)
Eosinophils Relative: 1 %
HCT: 29.7 % — ABNORMAL LOW (ref 40.0–52.0)
Hemoglobin: 9.9 g/dL — ABNORMAL LOW (ref 13.0–18.0)
Lymphocytes Relative: 40 %
Lymphs Abs: 4.1 10*3/uL — ABNORMAL HIGH (ref 1.0–3.6)
MCH: 32.2 pg (ref 26.0–34.0)
MCHC: 33.4 g/dL (ref 32.0–36.0)
MCV: 96.4 fL (ref 80.0–100.0)
Monocytes Absolute: 1 10*3/uL (ref 0.2–1.0)
Monocytes Relative: 10 %
Neutro Abs: 4.9 10*3/uL (ref 1.4–6.5)
Neutrophils Relative %: 47 %
Platelets: 113 10*3/uL — ABNORMAL LOW (ref 150–440)
RBC: 3.08 MIL/uL — ABNORMAL LOW (ref 4.40–5.90)
RDW: 17.2 % — ABNORMAL HIGH (ref 11.5–14.5)
WBC: 10.3 10*3/uL (ref 3.8–10.6)

## 2017-01-13 LAB — COMPREHENSIVE METABOLIC PANEL
ALBUMIN: 4.4 g/dL (ref 3.5–5.0)
ALK PHOS: 72 U/L (ref 38–126)
ALT: 15 U/L — ABNORMAL LOW (ref 17–63)
AST: 18 U/L (ref 15–41)
Anion gap: 10 (ref 5–15)
BILIRUBIN TOTAL: 0.6 mg/dL (ref 0.3–1.2)
BUN: 27 mg/dL — AB (ref 6–20)
CALCIUM: 9 mg/dL (ref 8.9–10.3)
CO2: 22 mmol/L (ref 22–32)
CREATININE: 1.58 mg/dL — AB (ref 0.61–1.24)
Chloride: 102 mmol/L (ref 101–111)
GFR calc Af Amer: 51 mL/min — ABNORMAL LOW (ref >60)
GFR calc non Af Amer: 44 mL/min — ABNORMAL LOW (ref >60)
GLUCOSE: 111 mg/dL — AB (ref 65–99)
POTASSIUM: 4.7 mmol/L (ref 3.5–5.1)
Sodium: 134 mmol/L — ABNORMAL LOW (ref 135–145)
TOTAL PROTEIN: 7.4 g/dL (ref 6.5–8.1)

## 2017-01-13 LAB — LACTATE DEHYDROGENASE: LDH: 116 U/L (ref 98–192)

## 2017-01-13 MED ORDER — NALOXEGOL OXALATE 25 MG PO TABS: 25.0000 mg | ORAL_TABLET | Freq: Every day | ORAL | 0 refills | Status: DC

## 2017-01-13 NOTE — Progress Notes (Signed)
[place] OFFICE PROGRESS NOTE  Patient Care Team: [name], MD as PCP - General (Internal Medicine)   SUMMARY OF ONCOLOGIC HISTORY: Oncology History   # 2006- CLL STAGE IV; MAY 2011- WBC- 57K;Platelets-99;Hb-12/CT Bulky LN; BMBx- 80% Invol; del 11; START Benda-Ritux x4 [finished Sep 2011];   # July 2015-Progression; Sep 2015-START ibrutinib; CT scan DEC 2015- Improvement LN; Cont Ibrutinib '140mg'$ /d; NOV 2016 CT- 1-2CM LN [mild progression compared to Dec 2015];NOV 2016- FISH peripheral blood- NO MUTATIONS/CD-38 Positive; NOV 7th- CONT IBRUTINIB 2 pills/day; CT AUG 2017- STABLE;  DEC 6th PET- Mild RP LN/ Retrocrural LN   SA skin infection [Oct 2016] s/p clinda     CLL (chronic lymphoid leukemia) in relapse [facility])   INTERVAL HISTORY:  66 year old male patient with history of relapsed CLL currently on second line therapy ibrutinib since September 2015 for follow-up; patient is currently on 2 pills of ibrutinib, due to intolerance, returns to clinic today for routine follow-up.   In the interim patient had been evaluated by GI for chronic and unchanged abdominal pain. EGD previously had shown gastritis but no malignant changes.   Today, he continues to have abdominal pain, early satiety, and constipation. GI recommended omeprazole bid which he reports having stopped because it was not helping. He does feel that the symptoms of constipation and abdominal pain have worsened since he started the oxycodone but is reluctant to stop oxycodone because it is the only thing that stops his pain. He currently takes oxycodone every 4-5 hours. He has a bowel movement every 2-3 days and reports straining with BMs. He has lost another 4 pounds in the past month and reports poor appetite. He has tried stool softeners and laxatives with sometimes help his constipation but not resolved. He says he has stopped drinking alcohol.   He had blisters on his face and arms which he says have  resolved.   No night sweats. No recent infections or fevers. No diarrhea. Denies any palpitations or syncopal episodes. No bleeding.  No nausea no vomiting. Denies any easy bruising or bleeding.  REVIEW OF SYSTEMS:  A complete 10 point review of system is done which is negative except mentioned above/history of present illness.   PAST MEDICAL HISTORY :  Past Medical History:  Diagnosis Date  . CLL (chronic lymphocytic leukemia) ([place])   . Diabetes mellitus without complication ([place])   . Hematuria   . Hypertension     PAST SURGICAL HISTORY :   Past Surgical History:  Procedure Laterality Date  . CHOLECYSTECTOMY  1983  . ESOPHAGOGASTRODUODENOSCOPY (EGD) WITH PROPOFOL N/A 11/20/2016   Procedure: ESOPHAGOGASTRODUODENOSCOPY (EGD) WITH PROPOFOL;  Surgeon: Manya Silvas, MD;  Location: [facility] ENDOSCOPY;  Service: Endoscopy;  Laterality: N/A;    FAMILY HISTORY :   Family History  Problem Relation Age of Onset  . Hypertension Sister     SOCIAL HISTORY:   Social History  Substance Use Topics  . Smoking status: Current Every Day Smoker    Packs/day: 1.00    Years: 47.00    Types: Cigarettes  . Smokeless tobacco: Never Used  . Alcohol use 3.0 oz/week    5 Glasses of wine per week     Comment: 5 a week    ALLERGIES:  has No Known Allergies.  MEDICATIONS:  Current Outpatient Prescriptions  Medication Sig Dispense Refill  . amLODipine (NORVASC) 10 MG tablet Take 1 tablet (10 mg total) by mouth daily. 30 tablet 3  . aspirin EC 81  MG tablet Take 81 mg by mouth daily.    . Cyanocobalamin (B-12 COMPLIANCE INJECTION) 1000 MCG/ML KIT Inject 1 mL as directed every 4 (four) months.    . Cyanocobalamin (RA VITAMIN B-12 TR) 1000 MCG TBCR Take by mouth.    . Ibrutinib (IMBRUVICA) 280 MG TABS Take 1 capsule by mouth daily. 30 tablet 6  . lisinopril-hydrochlorothiazide (PRINZIDE,ZESTORETIC) 20-25 MG tablet Take 1 tablet by mouth daily. 30 tablet 4  . metFORMIN (GLUMETZA) 1000 MG (MOD) 24 hr  tablet Take 1,000 mg by mouth 2 (two) times daily.    Marland Kitchen oxyCODONE-acetaminophen (PERCOCET/ROXICET) 5-325 MG tablet 1 pill every 8-12 hours 60 tablet 0  . Sennosides (EX-LAX PO) Take 2 capsules by mouth as needed (constipation).    . naloxegol oxalate (MOVANTIK) 25 MG TABS tablet Take 1 tablet (25 mg total) by mouth daily. 30 tablet 0   No current facility-administered medications for this visit.     PHYSICAL EXAMINATION: ECOG PERFORMANCE STATUS: 0 - Asymptomatic  BP (!) 152/87   Pulse 73   Temp 97.6 F (36.4 C) (Tympanic)   Resp 20   Ht 5' 8"  (1.727 m)   Wt 189 lb (85.7 kg)   BMI 28.74 kg/m   Filed Weights   01/13/17 1521  Weight: 189 lb (85.7 kg)    GENERAL: Well-nourished well-developed; Alert, no distress and comfortable. Alone. EYES: no pallor or icterus OROPHARYNX: no thrush or ulceration; good dentition  NECK: supple, no masses felt LYMPH:  no palpable lymphadenopathy in the cervical, axillary or inguinal regions LUNGS: clear to auscultation and  No wheeze or crackles HEART/CVS: regular rate & rhythm and no murmurs; No lower extremity edema ABDOMEN:abdomen soft, non-tender and normal bowel sounds.  Musculoskeletal:no cyanosis of digits and no clubbing  PSYCH: alert & oriented x 3 with fluent speech NEURO: no focal motor/sensory deficits SKIN:   No rash. Small bruises and healed blisters on arms. LABORATORY DATA:  I have reviewed the data as listed    Component Value Date/Time   NA 134 (L) 01/13/2017 1450   NA 142 05/03/2014 1139   K 4.7 01/13/2017 1450   K 4.7 05/03/2014 1139   CL 102 01/13/2017 1450   CL 110 (H) 05/03/2014 1139   CO2 22 01/13/2017 1450   CO2 24 05/03/2014 1139   GLUCOSE 111 (H) 01/13/2017 1450   GLUCOSE 98 05/03/2014 1139   BUN 27 (H) 01/13/2017 1450   BUN 20 (H) 05/03/2014 1139   CREATININE 1.58 (H) 01/13/2017 1450   CREATININE 1.22 08/09/2014 1122   CALCIUM 9.0 01/13/2017 1450   CALCIUM 8.6 05/03/2014 1139   PROT 7.4 01/13/2017 1450    PROT 7.5 05/03/2014 1139   ALBUMIN 4.4 01/13/2017 1450   ALBUMIN 4.1 05/03/2014 1139   AST 18 01/13/2017 1450   AST 15 05/03/2014 1139   ALT 15 (L) 01/13/2017 1450   ALT 26 05/03/2014 1139   ALKPHOS 72 01/13/2017 1450   ALKPHOS 94 05/03/2014 1139   BILITOT 0.6 01/13/2017 1450   BILITOT 0.5 05/03/2014 1139   GFRNONAA 44 (L) 01/13/2017 1450   GFRNONAA >60 08/09/2014 1122   GFRAA 51 (L) 01/13/2017 1450   GFRAA >60 08/09/2014 1122    No results found for: SPEP, UPEP  Lab Results  Component Value Date   WBC 10.3 01/13/2017   NEUTROABS 4.9 01/13/2017   HGB 9.9 (L) 01/13/2017   HCT 29.7 (L) 01/13/2017   MCV 96.4 01/13/2017   PLT 113 (L) 01/13/2017  Chemistry      Component Value Date/Time   NA 134 (L) 01/13/2017 1450   NA 142 05/03/2014 1139   K 4.7 01/13/2017 1450   K 4.7 05/03/2014 1139   CL 102 01/13/2017 1450   CL 110 (H) 05/03/2014 1139   CO2 22 01/13/2017 1450   CO2 24 05/03/2014 1139   BUN 27 (H) 01/13/2017 1450   BUN 20 (H) 05/03/2014 1139   CREATININE 1.58 (H) 01/13/2017 1450   CREATININE 1.22 08/09/2014 1122      Component Value Date/Time   CALCIUM 9.0 01/13/2017 1450   CALCIUM 8.6 05/03/2014 1139   ALKPHOS 72 01/13/2017 1450   ALKPHOS 94 05/03/2014 1139   AST 18 01/13/2017 1450   AST 15 05/03/2014 1139   ALT 15 (L) 01/13/2017 1450   ALT 26 05/03/2014 1139   BILITOT 0.6 01/13/2017 1450   BILITOT 0.5 05/03/2014 1139     IMPRESSION: 1. Mildly enlarged and mildly hypermetabolic retrocaval lymphadenopathy, mildly increased in size since 12/19/2015 CT study, consistent with metabolically active low grade lymphoma. 2. No additional sites of metabolically active lymphadenopathy. 3. Relatively uniform low level marrow hypermetabolism throughout the visualized skeleton, which is nonspecific and may indicate metabolically active leukemia. 4. Additional findings include aortic atherosclerosis, left main and 3 vessel coronary atherosclerosis, minimal  sigmoid diverticulosis and mild prostatomegaly.  Electronically Signed   By: Ilona Sorrel M.D.   On: 04/02/2016 13:16  NM PET Image Restag 01/07/17 IMPRESSION: 1. No evidence of residual metabolically active lymphoma in the neck, chest, abdomen or pelvis. 2. No acute findings. 3. Stable incidental findings including Aortic Atherosclerosis (ICD10-I70.0). 4. Stable small left upper lobe nodules from recent chest CT.  Electronically Signed   By: Richardean Sale M.D.   On: 01/07/2017 16:13  CT CHEST LUNG CA SCREEN LOW DOSE W/O CM  IMPRESSION: 1. Lung-RADS 2-S, benign appearance or behavior. Continue annual screening with low-dose chest CT without contrast in 12 months. 2. The "S" modifier above refers to potentially clinically significant non lung cancer related findings. Specifically, left main and 3 vessel coronary atherosclerosis. Aortic Atherosclerosis (ICD10-I70.0) and Emphysema (ICD10-J43.9).  Electronically Signed   By: Ilona Sorrel M.D.   On: 12/11/2016 15:21   ASSESSMENT & PLAN:  CLL (chronic lymphoid leukemia) in relapse ([place]) # CLL/SLL- relapsed currently on second line therapy with ibrutinib September 2016. DEC 2017- PET small 1 cm LN RP/retrocrural LN. PET from 01/07/17 showed no evidence of residual metabolically active lymphoma. Patient tolerating ibrutinib fairly well except for poorly controlled blood pressures. Will plan to continue Ibrutinib 280 mg/day; mild anemia hemoglobin 9.9, platelets - 113 secondary to ibrutinib. Creatinine 1.58. Suspect this is related to the ibrutinib. Continue to monitor at this time.   # Elevated Blood pressure- Poorly controlled secondary to ibrutinib. Previously added norvasc 10 mg/day  and refilled lisinopril-HCTZ 20-25. Had previously recommended patient monitor blood pressures at home.    # Skin infection/blisters- resolved  # Anemia/CKD- II/ SLL-] mildly worse today but stable. Asymptomatic. Continue to monitor.   # Chronic  Epigastric Abdominal pain- not improved;s/p EGD [[name]]; still on percocet BID. Reviewed GIs note. Patient had stopped omeprazole because it 'didn't help'. Recommended patient continue medications and follow back up with GI.   #opioid induced constipation- patient reports constipation unrelieved by otc medications. Patient describes that symptoms are worse since taking opioids but is unable to stop taking due to pain. Movantik may be a good option for him. Will send script.    #  follow up with in 3 months with labs.   Beckey Rutter, NP 01/13/17 4:29 PM   Verlon Au, NP 01/13/2017 4:28 PM

## 2017-01-13 NOTE — Progress Notes (Signed)
Patient has lost 4 lbs since last visit. Pt states that he continues to have abdominal discomfort which contributes to decrease appetite. Pt reports intermittent use of laxatives. Last BM-on Saturday. Pt had to take 2 capsules of exlax to produce bm.

## 2017-01-13 NOTE — Assessment & Plan Note (Signed)
#   CLL/SLL- relapsed currently on second line therapy with ibrutinib September 2016. DEC 2017- PET small 1 cm LN RP/retrocrural LN. PET from 01/07/17 showed no evidence of residual metabolically active lymphoma. Patient tolerating ibrutinib fairly well except for poorly controlled blood pressures. Will plan to continue Ibrutinib 280 mg/day; mild anemia hemoglobin 9.9, platelets - 113 secondary to ibrutinib. Creatinine 1.58. Suspect this is related to the ibrutinib. Continue to monitor at this time.   # Elevated Blood pressure- Poorly controlled secondary to ibrutinib. Previously added norvasc 10 mg/day and refilled lisinopril-HCTZ 20-25. Had previously recommended patient monitor blood pressures at home.    # Skin infection/blisters- resolved  # Anemia/CKD- II/ SLL-] mildly worse today but stable. Asymptomatic. Continue to monitor.   # Chronic Epigastric Abdominal pain- not improved;s/p EGD [[name]]; still on percocet BID. Reviewed GIs note. Patient had stopped omeprazole because it 'didn't help'. Recommended patient continue medications and follow back up with GI.   #opioid induced constipation- patient reports constipation unrelieved by otc medications. Patient describes that symptoms are worse since taking opioids but is unable to stop taking due to pain. Movantik may be a good option for him. Will send script.    # follow up with in 3 months with labs.

## 2017-01-20 ENCOUNTER — Other Ambulatory Visit: Payer: Self-pay | Admitting: *Deleted

## 2017-01-20 DIAGNOSIS — C9192 Lymphoid leukemia, unspecified, in relapse: Secondary | ICD-10-CM

## 2017-01-20 DIAGNOSIS — C9112 Chronic lymphocytic leukemia of B-cell type in relapse: Secondary | ICD-10-CM

## 2017-01-20 DIAGNOSIS — C8300 Small cell B-cell lymphoma, unspecified site: Secondary | ICD-10-CM

## 2017-01-20 MED ORDER — OXYCODONE-ACETAMINOPHEN 5-325 MG PO TABS: ORAL_TABLET | ORAL | 0 refills | Status: DC

## 2017-01-30 MED FILL — IMBRUVICA 280 MG TAB: 280 | 28 days supply | Qty: 28 | Fill #1

## 2017-02-10 ENCOUNTER — Other Ambulatory Visit: Payer: Self-pay | Admitting: *Deleted

## 2017-02-10 DIAGNOSIS — C9112 Chronic lymphocytic leukemia of B-cell type in relapse: Secondary | ICD-10-CM

## 2017-02-10 DIAGNOSIS — C8300 Small cell B-cell lymphoma, unspecified site: Secondary | ICD-10-CM

## 2017-02-10 DIAGNOSIS — C9192 Lymphoid leukemia, unspecified, in relapse: Secondary | ICD-10-CM

## 2017-02-10 MED ORDER — OXYCODONE-ACETAMINOPHEN 5-325 MG PO TABS: ORAL_TABLET | ORAL | 0 refills | Status: DC

## 2017-02-10 NOTE — Telephone Encounter (Signed)
Informed that prescription is ready to pick up Left message on voice mail  

## 2017-02-20 NOTE — Telephone Encounter (Signed)
Oral Oncology Patient Advocate Encounter  Patient has PANF $7600.00 11/27/2016-11/26/2017  Co-pay is $608.44   [place] Patient Advocate [phone] 02/20/2017 4:17 PM

## 2017-02-26 MED FILL — IMBRUVICA 280 MG TAB: 280 | 28 days supply | Qty: 28 | Fill #2

## 2017-02-28 ENCOUNTER — Other Ambulatory Visit: Payer: Self-pay | Admitting: *Deleted

## 2017-02-28 DIAGNOSIS — C9192 Lymphoid leukemia, unspecified, in relapse: Secondary | ICD-10-CM

## 2017-02-28 DIAGNOSIS — C9112 Chronic lymphocytic leukemia of B-cell type in relapse: Secondary | ICD-10-CM

## 2017-02-28 DIAGNOSIS — C8300 Small cell B-cell lymphoma, unspecified site: Secondary | ICD-10-CM

## 2017-02-28 MED ORDER — OXYCODONE-ACETAMINOPHEN 5-325 MG PO TABS: ORAL_TABLET | ORAL | 0 refills | Status: DC

## 2017-03-17 ENCOUNTER — Other Ambulatory Visit: Payer: Self-pay | Admitting: *Deleted

## 2017-03-17 DIAGNOSIS — C9112 Chronic lymphocytic leukemia of B-cell type in relapse: Secondary | ICD-10-CM

## 2017-03-17 DIAGNOSIS — C8300 Small cell B-cell lymphoma, unspecified site: Secondary | ICD-10-CM

## 2017-03-17 DIAGNOSIS — C9192 Lymphoid leukemia, unspecified, in relapse: Secondary | ICD-10-CM

## 2017-03-18 MED ORDER — OXYCODONE-ACETAMINOPHEN 5-325 MG PO TABS: ORAL_TABLET | ORAL | 0 refills | Status: DC

## 2017-03-19 MED FILL — IMBRUVICA 280 MG TAB: 280 | 28 days supply | Qty: 28 | Fill #3

## 2017-03-26 ENCOUNTER — Telehealth: Payer: Self-pay | Admitting: *Deleted

## 2017-03-26 MED ORDER — CLINDAMYCIN HCL 300 MG PO CAPS: 300.0000 mg | ORAL_CAPSULE | Freq: Four times a day (QID) | ORAL | 0 refills | Status: DC

## 2017-03-26 NOTE — Telephone Encounter (Signed)
Patient called to request prescription for Clindamycin as he has developed sores on the neck and hands similar to the sores he had back in August and he was put on Clindamycin 300 mg four times a day for it then. Please advise

## 2017-04-01 ENCOUNTER — Telehealth: Payer: Self-pay | Admitting: Pharmacist

## 2017-04-01 NOTE — Telephone Encounter (Signed)
Oral Chemotherapy Pharmacist Encounter  Follow-Up Form  Called patient today to follow up regarding patient's oral chemotherapy medication: Imbruvica (ibrutinib)  Original Start date of oral chemotherapy: 12/2013  Pt reports 0 tablets/doses of ibrutinib missed in the last month. Reviewed plan for missed doses.   Pt reports the following side effects: N/A  Recent labs reviewed: CBC from 01/13/17  New medications?: yes clindamycin, there is no interaction with his ibrutinib  Other Issues: N/A  Patient knows to call the office with questions or concerns. Oral Oncology Clinic will continue to follow.  Darl Pikes, PharmD, BCPS Hematology/Oncology Clinical Pharmacist [facility] [phone]  04/01/2017 3:06 PM

## 2017-04-09 ENCOUNTER — Other Ambulatory Visit: Payer: Self-pay | Admitting: *Deleted

## 2017-04-09 DIAGNOSIS — C8300 Small cell B-cell lymphoma, unspecified site: Secondary | ICD-10-CM

## 2017-04-09 DIAGNOSIS — C9112 Chronic lymphocytic leukemia of B-cell type in relapse: Secondary | ICD-10-CM

## 2017-04-09 DIAGNOSIS — C9192 Lymphoid leukemia, unspecified, in relapse: Secondary | ICD-10-CM

## 2017-04-09 MED ORDER — OXYCODONE-ACETAMINOPHEN 5-325 MG PO TABS: ORAL_TABLET | ORAL | 0 refills | Status: DC

## 2017-04-09 NOTE — Telephone Encounter (Signed)
Informed that prescription is ready to pick up, left message on voice mail

## 2017-04-14 ENCOUNTER — Inpatient Hospital Stay (HOSPITAL_BASED_OUTPATIENT_CLINIC_OR_DEPARTMENT_OTHER): Payer: Medicare Other | Admitting: Internal Medicine

## 2017-04-14 ENCOUNTER — Inpatient Hospital Stay: Payer: Medicare Other | Attending: Internal Medicine

## 2017-04-14 ENCOUNTER — Encounter: Payer: Self-pay | Admitting: Internal Medicine

## 2017-04-14 VITALS — BP 174/78 | HR 65 | Temp 98.1°F | Resp 16 | Wt 196.8 lb

## 2017-04-14 DIAGNOSIS — E119 Type 2 diabetes mellitus without complications: Secondary | ICD-10-CM

## 2017-04-14 DIAGNOSIS — G8929 Other chronic pain: Secondary | ICD-10-CM | POA: Diagnosis not present

## 2017-04-14 DIAGNOSIS — Z79899 Other long term (current) drug therapy: Secondary | ICD-10-CM | POA: Insufficient documentation

## 2017-04-14 DIAGNOSIS — R51 Headache: Secondary | ICD-10-CM | POA: Insufficient documentation

## 2017-04-14 DIAGNOSIS — I10 Essential (primary) hypertension: Secondary | ICD-10-CM

## 2017-04-14 DIAGNOSIS — I700 Atherosclerosis of aorta: Secondary | ICD-10-CM

## 2017-04-14 DIAGNOSIS — D6481 Anemia due to antineoplastic chemotherapy: Secondary | ICD-10-CM | POA: Diagnosis not present

## 2017-04-14 DIAGNOSIS — T451X5S Adverse effect of antineoplastic and immunosuppressive drugs, sequela: Secondary | ICD-10-CM

## 2017-04-14 DIAGNOSIS — Z9221 Personal history of antineoplastic chemotherapy: Secondary | ICD-10-CM | POA: Insufficient documentation

## 2017-04-14 DIAGNOSIS — F17210 Nicotine dependence, cigarettes, uncomplicated: Secondary | ICD-10-CM | POA: Diagnosis not present

## 2017-04-14 DIAGNOSIS — Z7982 Long term (current) use of aspirin: Secondary | ICD-10-CM | POA: Insufficient documentation

## 2017-04-14 DIAGNOSIS — R1013 Epigastric pain: Secondary | ICD-10-CM

## 2017-04-14 DIAGNOSIS — C9192 Lymphoid leukemia, unspecified, in relapse: Secondary | ICD-10-CM | POA: Diagnosis not present

## 2017-04-14 DIAGNOSIS — C9112 Chronic lymphocytic leukemia of B-cell type in relapse: Secondary | ICD-10-CM

## 2017-04-14 DIAGNOSIS — R5383 Other fatigue: Secondary | ICD-10-CM

## 2017-04-14 DIAGNOSIS — R918 Other nonspecific abnormal finding of lung field: Secondary | ICD-10-CM | POA: Diagnosis not present

## 2017-04-14 LAB — COMPREHENSIVE METABOLIC PANEL
ALT: 25 U/L (ref 17–63)
ANION GAP: 8 (ref 5–15)
AST: 25 U/L (ref 15–41)
Albumin: 4 g/dL (ref 3.5–5.0)
Alkaline Phosphatase: 65 U/L (ref 38–126)
BILIRUBIN TOTAL: 0.3 mg/dL (ref 0.3–1.2)
BUN: 28 mg/dL — ABNORMAL HIGH (ref 6–20)
CHLORIDE: 106 mmol/L (ref 101–111)
CO2: 24 mmol/L (ref 22–32)
Calcium: 8.9 mg/dL (ref 8.9–10.3)
Creatinine, Ser: 1.35 mg/dL — ABNORMAL HIGH (ref 0.61–1.24)
GFR calc non Af Amer: 52 mL/min — ABNORMAL LOW (ref >60)
Glucose, Bld: 117 mg/dL — ABNORMAL HIGH (ref 65–99)
POTASSIUM: 3.8 mmol/L (ref 3.5–5.1)
Sodium: 138 mmol/L (ref 135–145)
TOTAL PROTEIN: 6.9 g/dL (ref 6.5–8.1)

## 2017-04-14 LAB — CBC WITH DIFFERENTIAL/PLATELET
BASOS ABS: 0.2 10*3/uL — AB (ref 0–0.1)
Basophils Relative: 2 %
EOS PCT: 0 %
Eosinophils Absolute: 0 10*3/uL (ref 0–0.7)
HEMATOCRIT: 30.1 % — AB (ref 40.0–52.0)
HEMOGLOBIN: 9.7 g/dL — AB (ref 13.0–18.0)
LYMPHS ABS: 3.2 10*3/uL (ref 1.0–3.6)
LYMPHS PCT: 26 %
MCH: 31.9 pg (ref 26.0–34.0)
MCHC: 32.1 g/dL (ref 32.0–36.0)
MCV: 99.4 fL (ref 80.0–100.0)
Monocytes Absolute: 1.3 10*3/uL — ABNORMAL HIGH (ref 0.2–1.0)
Monocytes Relative: 11 %
NEUTROS ABS: 7.4 10*3/uL — AB (ref 1.4–6.5)
NEUTROS PCT: 61 %
PLATELETS: 153 10*3/uL (ref 150–440)
RBC: 3.03 MIL/uL — AB (ref 4.40–5.90)
RDW: 19.2 % — ABNORMAL HIGH (ref 11.5–14.5)
WBC: 12.2 10*3/uL — ABNORMAL HIGH (ref 3.8–10.6)

## 2017-04-14 LAB — LACTATE DEHYDROGENASE: LDH: 134 U/L (ref 98–192)

## 2017-04-14 MED ORDER — METOPROLOL TARTRATE 25 MG PO TABS: 25.0000 mg | ORAL_TABLET | Freq: Two times a day (BID) | ORAL | 4 refills | Status: DC

## 2017-04-14 NOTE — Assessment & Plan Note (Addendum)
#   CLL/SLL- relapsed currently on second line therapy with ibrutinib September 2016. SEP 2018- STABLE/ no progression.  # Patient tolerating ibrutinib fairly well except for poorly controlled blood pressures [see discussion below]  # Continue Ibrutinib 280 mg/day; mild anemia hemoglobin 9.4 platelets - 153 secondary to ibrutinib.   # Elevated Blood pressure- Poorly controlledsec to ibrutinib.On-norvasc 10 mg/day+ lisinopril-HCTZ 20-25; added Metoprolol 25 mg BID sent to pharmacy.  Again reminded to check his blood pressure at home.  # Anemia/CKD- II/ SLL-] stable.   # Chronic Epigastric Abdominal pain- not improved-STABLE;s/p EGD [[name]]; still on percocet BID.   # follow up in 2 months/labs/LDH.

## 2017-04-14 NOTE — Progress Notes (Signed)
[facility] OFFICE PROGRESS NOTE  Patient Care Team: [name], MD as PCP - General (Internal Medicine)   SUMMARY OF ONCOLOGIC HISTORY: Oncology History   # 2006- CLL STAGE IV; MAY 2011- WBC- 57K;Platelets-99;Hb-12/CT Bulky LN; BMBx- 80% Invol; del 11; START Benda-Ritux x4 [finished Sep 2011];   # July 2015-Progression; Sep 2015-START ibrutinib; CT scan DEC 2015- Improvement LN; Cont Ibrutinib '140mg'$ /d; NOV 2016 CT- 1-2CM LN [mild progression compared to Dec 2015];NOV 2016- FISH peripheral blood- NO MUTATIONS/CD-38 Positive; NOV 7th- CONT IBRUTINIB 2 pills/day; CT AUG 2017- STABLE;  DEC 6th PET- Mild RP LN/ Retrocrural LN   SA skin infection [Oct 2016] s/p clinda     CLL (chronic lymphoid leukemia) in relapse [facility])     INTERVAL HISTORY:  66 year old male patient with history of relapsed CLL currently on second line therapy ibrutinib since September 2015 for follow-up; patient is currently on '280mg'$  pills of ibrutinib [secondary to intolerance]- is here for follow-up.  Patient admits to intermittent headaches.  Complains of mild to moderate fatigue.  He has not been checking his blood pressure on a regular basis. He continues to have chronic abdominal pain not any worse.  Continues to take Percocet twice a day.   He denies any night sweats. No recent infections or fevers. No diarrhea.  No bleeding.  No nausea no vomiting. Denies any easy bruising or bleeding.    REVIEW OF SYSTEMS:  A complete 10 point review of system is done which is negative except mentioned above/history of present illness.   PAST MEDICAL HISTORY :  Past Medical History:  Diagnosis Date  . CLL (chronic lymphocytic leukemia) ([place])   . Diabetes mellitus without complication ([place])   . Hematuria   . Hypertension     PAST SURGICAL HISTORY :   Past Surgical History:  Procedure Laterality Date  . CHOLECYSTECTOMY  1983  . ESOPHAGOGASTRODUODENOSCOPY (EGD) WITH PROPOFOL N/A 11/20/2016   Procedure: ESOPHAGOGASTRODUODENOSCOPY (EGD) WITH PROPOFOL;  Surgeon: Manya Silvas, MD;  [facility];  Service: Endoscopy;  Laterality: N/A;    FAMILY HISTORY :   Family History  Problem Relation Age of Onset  . Hypertension Sister     SOCIAL HISTORY:   Social History   Tobacco Use  . Smoking status: Current Every Day Smoker    Packs/day: 1.00    Years: 47.00    Pack years: 47.00    Types: Cigarettes  . Smokeless tobacco: Never Used  Substance Use Topics  . Alcohol use: Yes    Alcohol/week: 3.0 oz    Types: 5 Glasses of wine per week    Comment: 5 a week  . Drug use: No    ALLERGIES:  has No Known Allergies.  MEDICATIONS:  Current Outpatient Medications  Medication Sig Dispense Refill  . amLODipine (NORVASC) 10 MG tablet Take 1 tablet (10 mg total) by mouth daily. 30 tablet 3  . aspirin EC 81 MG tablet Take 81 mg by mouth daily.    . clindamycin (CLEOCIN) 300 MG capsule Take 1 capsule (300 mg total) by mouth 4 (four) times daily. 40 capsule 0  . Cyanocobalamin (B-12 COMPLIANCE INJECTION) 1000 MCG/ML KIT Inject 1 mL as directed every 4 (four) months.    . Cyanocobalamin (RA VITAMIN B-12 TR) 1000 MCG TBCR Take by mouth.    . Ibrutinib (IMBRUVICA) 280 MG TABS Take 1 capsule by mouth daily. 30 tablet 6  . lisinopril-hydrochlorothiazide (PRINZIDE,ZESTORETIC) 20-25 MG tablet Take 1 tablet by mouth daily. [place]  tablet 4  . metFORMIN (GLUMETZA) 1000 MG (MOD) 24 hr tablet Take 1,000 mg by mouth 2 (two) times daily.    . naloxegol oxalate (MOVANTIK) 25 MG TABS tablet Take 1 tablet (25 mg total) by mouth daily. 30 tablet 0  . oxyCODONE-acetaminophen (PERCOCET/ROXICET) 5-325 MG tablet 1 pill every 8-12 hours 60 tablet 0  . Sennosides (EX-LAX PO) Take 2 capsules by mouth as needed (constipation).    . metoprolol tartrate (LOPRESSOR) 25 MG tablet Take 1 tablet (25 mg total) by mouth 2 (two) times daily. 60 tablet 4   No current facility-administered medications for this  visit.     PHYSICAL EXAMINATION: ECOG PERFORMANCE STATUS: 0 - Asymptomatic  BP (!) 174/78 (BP Location: Left Arm, Patient Position: Sitting)   Pulse 65   Temp 98.1 F (36.7 C) (Tympanic)   Resp 16   Wt 196 lb 12.8 oz (89.3 kg)   BMI 29.92 kg/m   Filed Weights   04/14/17 1432  Weight: 196 lb 12.8 oz (89.3 kg)    GENERAL: Well-nourished well-developed; Alert, no distress and comfortable.   Alone. EYES: no pallor or icterus OROPHARYNX: no thrush or ulceration; good dentition  NECK: supple, no masses felt LYMPH:  no palpable lymphadenopathy in the cervical, axillary or inguinal regions LUNGS: clear to auscultation and  No wheeze or crackles HEART/CVS: regular rate & rhythm and no murmurs; No lower extremity edema ABDOMEN:abdomen soft, non-tender and normal bowel sounds.  Musculoskeletal:no cyanosis of digits and no clubbing  PSYCH: alert & oriented x 3 with fluent speech NEURO: no focal motor/sensory deficits SKIN:   No rash.  LABORATORY DATA:  I have reviewed the data as listed    Component Value Date/Time   NA 138 04/14/2017 1348   NA 142 05/03/2014 1139   K 3.8 04/14/2017 1348   K 4.7 05/03/2014 1139   CL 106 04/14/2017 1348   CL 110 (H) 05/03/2014 1139   CO2 24 04/14/2017 1348   CO2 24 05/03/2014 1139   GLUCOSE 117 (H) 04/14/2017 1348   GLUCOSE 98 05/03/2014 1139   BUN 28 (H) 04/14/2017 1348   BUN 20 (H) 05/03/2014 1139   CREATININE 1.35 (H) 04/14/2017 1348   CREATININE 1.22 08/09/2014 1122   CALCIUM 8.9 04/14/2017 1348   CALCIUM 8.6 05/03/2014 1139   PROT 6.9 04/14/2017 1348   PROT 7.5 05/03/2014 1139   ALBUMIN 4.0 04/14/2017 1348   ALBUMIN 4.1 05/03/2014 1139   AST 25 04/14/2017 1348   AST 15 05/03/2014 1139   ALT 25 04/14/2017 1348   ALT 26 05/03/2014 1139   ALKPHOS 65 04/14/2017 1348   ALKPHOS 94 05/03/2014 1139   BILITOT 0.3 04/14/2017 1348   BILITOT 0.5 05/03/2014 1139   GFRNONAA 52 (L) 04/14/2017 1348   GFRNONAA >60 08/09/2014 1122   GFRAA >60  04/14/2017 1348   GFRAA >60 08/09/2014 1122    No results found for: SPEP, UPEP  Lab Results  Component Value Date   WBC 12.2 (H) 04/14/2017   NEUTROABS 7.4 (H) 04/14/2017   HGB 9.7 (L) 04/14/2017   HCT 30.1 (L) 04/14/2017   MCV 99.4 04/14/2017   PLT 153 04/14/2017      Chemistry      Component Value Date/Time   NA 138 04/14/2017 1348   NA 142 05/03/2014 1139   K 3.8 04/14/2017 1348   K 4.7 05/03/2014 1139   CL 106 04/14/2017 1348   CL 110 (H) 05/03/2014 1139   CO2 24 04/14/2017 1348  CO2 24 05/03/2014 1139   BUN 28 (H) 04/14/2017 1348   BUN 20 (H) 05/03/2014 1139   CREATININE 1.35 (H) 04/14/2017 1348   CREATININE 1.22 08/09/2014 1122      Component Value Date/Time   CALCIUM 8.9 04/14/2017 1348   CALCIUM 8.6 05/03/2014 1139   ALKPHOS 65 04/14/2017 1348   ALKPHOS 94 05/03/2014 1139   AST 25 04/14/2017 1348   AST 15 05/03/2014 1139   ALT 25 04/14/2017 1348   ALT 26 05/03/2014 1139   BILITOT 0.3 04/14/2017 1348   BILITOT 0.5 05/03/2014 1139     IMPRESSION: 1. No evidence of residual metabolically active lymphoma in the neck, chest, abdomen or pelvis. 2. No acute findings. 3. Stable incidental findings including Aortic Atherosclerosis (ICD10-I70.0). 4. Stable small left upper lobe nodules from recent chest CT.   Electronically Signed   By: Richardean Sale M.D.   On: 01/07/2017 16:13    ASSESSMENT & PLAN:  CLL (chronic lymphoid leukemia) in relapse ([place]) # CLL/SLL- relapsed currently on second line therapy with ibrutinib September 2016. SEP 2018- STABLE/ no progression.  # Patient tolerating ibrutinib fairly well except for poorly controlled blood pressures [see discussion below]  # Continue Ibrutinib 280 mg/day; mild anemia hemoglobin 9.4 platelets - 153 secondary to ibrutinib.   # Elevated Blood pressure- Poorly controlledsec to ibrutinib.On-norvasc 10 mg/day+ lisinopril-HCTZ 20-25; added Metoprolol 25 mg BID sent to pharmacy.  Again reminded to  check his blood pressure at home.  # Anemia/CKD- II/ SLL-] stable.   # Chronic Epigastric Abdominal pain- not improved-STABLE;s/p EGD [[name]]; still on percocet BID.   # follow up in 2 months/labs/LDH.     Cammie Sickle, MD 04/15/2017 4:37 PM

## 2017-04-16 ENCOUNTER — Telehealth: Payer: Self-pay | Admitting: Pharmacist

## 2017-04-16 NOTE — Telephone Encounter (Signed)
Oral Chemotherapy Pharmacist Encounter  Follow-Up Form  Called patient today to follow up regarding patient's oral chemotherapy medication: Imbruvica (ibrutinib)  Original Start date of oral chemotherapy: 12/2013  Pt reports 0 tablets/doses of ibrutinib missed in the last month. Reviewed plan for missed doses.   Pt reports the following side effects: increase in BP, this is actively being monitored and he was recently started on an additional BP medication metoprolol. Will continue to monitor BP. Asked hime to keep a log of his blood pressures at home.  Recent labs reviewed: CBC from 12/17, no relevant lab abnormalities.   New medications?: metoprolol, no interaction with ibrutinib  Other Issues: N/A  Patient knows to call the office with questions or concerns. Oral Oncology Clinic will continue to follow.  Darl Pikes, PharmD, BCPS Hematology/Oncology Clinical Pharmacist [facility] [phone]  04/16/2017 11:27 AM

## 2017-04-17 ENCOUNTER — Telehealth: Payer: Self-pay | Admitting: Internal Medicine

## 2017-04-17 NOTE — Telephone Encounter (Signed)
Oral Oncology Patient Advocate Encounter  Received an email from [facility] that patients co-pay was denied. I called [employer] [phone] and spoke with Delsa Bern T. She said he still has $4755.54 left in his account. He said that he needed to change some dates in system and wait two hours and resubmit. Sent e-mail to [facility].   [place] Patient Advocate [phone] 04/17/2017 11:46 AM

## 2017-04-18 MED FILL — IMBRUVICA 280 MG TAB: 280 | 28 days supply | Qty: 28 | Fill #4

## 2017-04-28 ENCOUNTER — Other Ambulatory Visit: Payer: Self-pay | Admitting: *Deleted

## 2017-04-28 DIAGNOSIS — C9112 Chronic lymphocytic leukemia of B-cell type in relapse: Secondary | ICD-10-CM

## 2017-04-28 DIAGNOSIS — C9192 Lymphoid leukemia, unspecified, in relapse: Secondary | ICD-10-CM

## 2017-04-28 DIAGNOSIS — C8300 Small cell B-cell lymphoma, unspecified site: Secondary | ICD-10-CM

## 2017-04-28 MED ORDER — OXYCODONE-ACETAMINOPHEN 5-325 MG PO TABS: ORAL_TABLET | ORAL | 0 refills | Status: DC

## 2017-05-06 ENCOUNTER — Encounter (INDEPENDENT_AMBULATORY_CARE_PROVIDER_SITE_OTHER): Payer: Self-pay | Admitting: Vascular Surgery

## 2017-05-06 ENCOUNTER — Ambulatory Visit (INDEPENDENT_AMBULATORY_CARE_PROVIDER_SITE_OTHER): Payer: Medicare Other | Admitting: Vascular Surgery

## 2017-05-06 VITALS — BP 138/81 | HR 54 | Resp 15 | Ht 67.0 in | Wt 195.0 lb

## 2017-05-06 DIAGNOSIS — R230 Cyanosis: Secondary | ICD-10-CM

## 2017-05-06 DIAGNOSIS — I10 Essential (primary) hypertension: Secondary | ICD-10-CM | POA: Diagnosis not present

## 2017-05-06 DIAGNOSIS — I70219 Atherosclerosis of native arteries of extremities with intermittent claudication, unspecified extremity: Secondary | ICD-10-CM

## 2017-05-06 DIAGNOSIS — E785 Hyperlipidemia, unspecified: Secondary | ICD-10-CM

## 2017-05-06 DIAGNOSIS — E118 Type 2 diabetes mellitus with unspecified complications: Secondary | ICD-10-CM | POA: Diagnosis not present

## 2017-05-06 NOTE — Progress Notes (Signed)
Subjective:    [name], male    DOB: 03-Mar-1951, 67 y.o.   MRN: [id number] Chief Complaint  Patient presents with  . New Patient (Initial Visit)    Blue toes   Presents as a new patient referred by Thresa Ross for evaluation of right lower extremity claudication and toe cyanosis.  The patient reports the nail on his right big toe started to fall off approximately 6 months ago.  He notes discomfort to the area as the new nail grew in.  His big toe and pinky toe have a bluish tint to them.  He notes tenderness to them.  He is also experiencing some heel tenderness.  The patient states he is able to ambulate approximately a quarter of a mile before he starts to experience calf cramping to the right lower extremity.  Once he stops the activity his pain will resolve.  He denies any rest pain or ulceration to the lower extremity.  He denies any fever, nausea vomiting.   Review of Systems  Constitutional: Negative.   HENT: Negative.   Eyes: Negative.   Respiratory: Negative.   Cardiovascular: Negative.        Right Calf Claudication Toe Cyanosis  Gastrointestinal: Negative.   Endocrine: Negative.   Genitourinary: Negative.   Musculoskeletal: Negative.   Skin: Negative.   Neurological: Negative.   Hematological: Negative.   Psychiatric/Behavioral: Negative.       Objective:   Physical Exam  Constitutional: He is oriented to person, place, and time. He appears well-developed and well-nourished. No distress.  HENT:  Head: Normocephalic and atraumatic.  Eyes: Conjunctivae are normal. Pupils are equal, round, and reactive to light.  Neck: Normal range of motion.  Cardiovascular: Normal rate, regular rhythm, normal heart sounds and intact distal pulses.  Pulses:      Radial pulses are 2+ on the right side, and 2+ on the left side.       Dorsalis pedis pulses are 1+ on the left side.       Posterior tibial pulses are 1+ on the left side.  Hard to palpate right pedal  pulses.  The right foot is warm.  The patient toes are warm.  Is a slight bluish tint to the right big toe and pinky toe.  No bluish tint to the heel.  Skin is intact.  Pulmonary/Chest: Effort normal and breath sounds normal.  Musculoskeletal: Normal range of motion. He exhibits no edema (No edema noted bilaterally).  Neurological: He is alert and oriented to person, place, and time.  Skin: He is not diaphoretic.  Psychiatric: He has a normal mood and affect. His behavior is normal. Judgment and thought content normal.  Vitals reviewed.  BP 138/81 (BP Location: Right Arm, Patient Position: Sitting)   Pulse (!) 54   Resp 15   Ht '5\' 7"'$  (1.702 m)   Wt 195 lb (88.5 kg)   BMI 30.54 kg/m   Past Medical History:  Diagnosis Date  . CLL (chronic lymphocytic leukemia) ([place])   . Diabetes mellitus without complication ([place])   . Hematuria   . Hypertension    Social History   Socioeconomic History  . Marital status: Married    Spouse name: Not on file  . Number of children: Not on file  . Years of education: Not on file  . Highest education level: Not on file  Social Needs  . Financial resource strain: Not on file  . Food insecurity - worry: Not on file  .  Food insecurity - inability: Not on file  . Transportation needs - medical: Not on file  . Transportation needs - non-medical: Not on file  Occupational History  . Not on file  Tobacco Use  . Smoking status: Current Every Day Smoker    Packs/day: 1.00    Years: 47.00    Pack years: 47.00    Types: Cigarettes  . Smokeless tobacco: Never Used  Substance and Sexual Activity  . Alcohol use: Yes    Alcohol/week: 3.0 oz    Types: 5 Glasses of wine per week    Comment: 5 a week  . Drug use: No  . Sexual activity: No    Partners: Female  Other Topics Concern  . Not on file  Social History Narrative  . Not on file   Past Surgical History:  Procedure Laterality Date  . CHOLECYSTECTOMY  1983  . ESOPHAGOGASTRODUODENOSCOPY (EGD)  WITH PROPOFOL N/A 11/20/2016   Procedure: ESOPHAGOGASTRODUODENOSCOPY (EGD) WITH PROPOFOL;  Surgeon: Manya Silvas, MD;  Location[facility] END[facility]COPY;  Service: Endoscopy;  Laterality: N/A;   Family History  Problem Relation Age of Onset  . Hypertension Sister    No Known Allergies     Assessment & Plan:  Presents as a new patient referred by Thresa Ross for evaluation of right lower extremity claudication and toe cyanosis.  The patient reports the nail on his right big toe started to fall off approximately 6 months ago.  He notes discomfort to the area as the new nail grew in.  His big toe and pinky toe have a bluish tint to them.  He notes tenderness to them.  He is also experiencing some heel tenderness.  The patient states he is able to ambulate approximately a quarter of a mile before he starts to experience calf cramping to the right lower extremity.  Once he stops the activity his pain will resolve.  He denies any rest pain or ulceration to the lower extremity.  He denies any fever, nausea vomiting.  1. Toe cyanosis - New Patient with multiple risk factors for peripheral artery disease I will order an ABI to rule out any contributing peripheral artery disease to the patient's discomfort There is no acute vascular compromise to the patient's right lower extremity Hard to palpate pedal pulses however the whole foot and the toes are warm. Skin is intact I have discussed with the patient at length the risk factors for and pathogenesis of atherosclerotic disease and encouraged a healthy diet, regular exercise regimen and blood pressure / glucose control.  The patient was encouraged to call the office in the interim if he experiences any claudication like symptoms, rest pain or ulcers to his feet / toes.  - VAS [place] ABI WITH/WO TBI; Future  2. Atherosclerotic peripheral vascular disease with intermittent claudication (HCC) - New As above  - VAS [place] ABI WITH/WO TBI; Future  3. Essential  hypertension - Stable Encouraged good control as its slows the progression of atherosclerotic disease  4. Hyperlipidemia, unspecified hyperlipidemia type - Stable Encouraged good control as its slows the progression of atherosclerotic disease  5. Type 2 diabetes mellitus with complication, unspecified whether long term insulin use (HCC) - Stable Encouraged good control as its slows the progression of atherosclerotic disease  Current Outpatient Medications on File Prior to Visit  Medication Sig Dispense Refill  . amLODipine (NORVASC) 10 MG tablet Take 1 tablet (10 mg total) by mouth daily. 30 tablet 3  . aspirin EC 81 MG tablet  Take 81 mg by mouth daily.    Marland Kitchen atorvastatin (LIPITOR) 10 MG tablet   0  . clindamycin (CLEOCIN) 300 MG capsule Take 1 capsule (300 mg total) by mouth 4 (four) times daily. 40 capsule 0  . hydrocortisone 2.5 % cream Apply topically.    . Ibrutinib (IMBRUVICA) 280 MG TABS Take 1 capsule by mouth daily. 30 tablet 6  . lisinopril (PRINIVIL,ZESTRIL) 30 MG tablet   1  . lisinopril-hydrochlorothiazide (PRINZIDE,ZESTORETIC) 20-25 MG tablet Take 1 tablet by mouth daily. 30 tablet 4  . metFORMIN (GLUMETZA) 1000 MG (MOD) 24 hr tablet Take 1,000 mg by mouth 2 (two) times daily.    . metoprolol tartrate (LOPRESSOR) 25 MG tablet Take 1 tablet (25 mg total) by mouth 2 (two) times daily. 60 tablet 4  . omeprazole (PRILOSEC) 40 MG capsule Take by mouth.    . oxyCODONE-acetaminophen (PERCOCET/ROXICET) 5-325 MG tablet 1 pill every 8-12 hours 60 tablet 0  . Cyanocobalamin (B-12 COMPLIANCE INJECTION) 1000 MCG/ML KIT Inject 1 mL as directed every 4 (four) months.    . Cyanocobalamin (RA VITAMIN B-12 TR) 1000 MCG TBCR Take by mouth.    . gabapentin (NEURONTIN) 100 MG capsule   0  . naloxegol oxalate (MOVANTIK) 25 MG TABS tablet Take 1 tablet (25 mg total) by mouth daily. (Patient not taking: Reported on 05/06/2017) 30 tablet 0  . Sennosides (EX-LAX PO) Take 2 capsules by mouth as needed  (constipation).    . sucralfate (CARAFATE) 1 g tablet Take by mouth.     No current facility-administered medications on file prior to visit.    There are no Patient Instructions on file for this visit. No Follow-up on file.  Zierra Laroque A Kimbley Sprague, PA-C

## 2017-05-09 MED FILL — IMBRUVICA 280 MG TAB: 280 | 28 days supply | Qty: 28 | Fill #5

## 2017-05-13 ENCOUNTER — Other Ambulatory Visit (INDEPENDENT_AMBULATORY_CARE_PROVIDER_SITE_OTHER): Payer: Medicare Other

## 2017-05-13 ENCOUNTER — Ambulatory Visit (INDEPENDENT_AMBULATORY_CARE_PROVIDER_SITE_OTHER): Payer: Medicare Other | Admitting: Vascular Surgery

## 2017-05-13 ENCOUNTER — Encounter (INDEPENDENT_AMBULATORY_CARE_PROVIDER_SITE_OTHER): Payer: Self-pay | Admitting: Vascular Surgery

## 2017-05-13 VITALS — BP 130/72 | HR 52 | Resp 16 | Wt 196.0 lb

## 2017-05-13 DIAGNOSIS — I70229 Atherosclerosis of native arteries of extremities with rest pain, unspecified extremity: Secondary | ICD-10-CM | POA: Insufficient documentation

## 2017-05-13 DIAGNOSIS — I70219 Atherosclerosis of native arteries of extremities with intermittent claudication, unspecified extremity: Secondary | ICD-10-CM

## 2017-05-13 DIAGNOSIS — E118 Type 2 diabetes mellitus with unspecified complications: Secondary | ICD-10-CM

## 2017-05-13 DIAGNOSIS — I10 Essential (primary) hypertension: Secondary | ICD-10-CM

## 2017-05-13 DIAGNOSIS — R230 Cyanosis: Secondary | ICD-10-CM | POA: Diagnosis not present

## 2017-05-13 DIAGNOSIS — I70221 Atherosclerosis of native arteries of extremities with rest pain, right leg: Secondary | ICD-10-CM | POA: Diagnosis not present

## 2017-05-13 NOTE — Progress Notes (Signed)
MRN : [id number]  James [name] is a 67 y.o. (June 18, 1950) male who presents with chief complaint of  Chief Complaint  Patient presents with  . Follow-up    abi ultrasound follow up  .  History of Present Illness: Patient returns today in follow up of his PAD.  He still has pain that is waking him at night in the right foot.  He denies open ulceration or infection though.  He does smoke.  His ABIs today were approximately 0.6 on the right but his digital pressure was only 50.  His ABI on the left was 0.79 with a digital pressure of 81.  His waveforms were fairly poor on the right and decent on the left.  Current Outpatient Medications  Medication Sig Dispense Refill  . amLODipine (NORVASC) 10 MG tablet Take 1 tablet (10 mg total) by mouth daily. 30 tablet 3  . aspirin EC 81 MG tablet Take 81 mg by mouth daily.    Marland Kitchen atorvastatin (LIPITOR) 10 MG tablet   0  . clindamycin (CLEOCIN) 300 MG capsule Take 1 capsule (300 mg total) by mouth 4 (four) times daily. 40 capsule 0  . Cyanocobalamin (B-12 COMPLIANCE INJECTION) 1000 MCG/ML KIT Inject 1 mL as directed every 4 (four) months.    . Cyanocobalamin (RA VITAMIN B-12 TR) 1000 MCG TBCR Take by mouth.    . gabapentin (NEURONTIN) 100 MG capsule   0  . hydrocortisone 2.5 % cream Apply topically.    . Ibrutinib (IMBRUVICA) 280 MG TABS Take 1 capsule by mouth daily. 30 tablet 6  . lisinopril (PRINIVIL,ZESTRIL) 30 MG tablet   1  . lisinopril-hydrochlorothiazide (PRINZIDE,ZESTORETIC) 20-25 MG tablet Take 1 tablet by mouth daily. 30 tablet 4  . metFORMIN (GLUMETZA) 1000 MG (MOD) 24 hr tablet Take 1,000 mg by mouth 2 (two) times daily.    . metoprolol tartrate (LOPRESSOR) 25 MG tablet Take 1 tablet (25 mg total) by mouth 2 (two) times daily. 60 tablet 4  . naloxegol oxalate (MOVANTIK) 25 MG TABS tablet Take 1 tablet (25 mg total) by mouth daily. 30 tablet 0  . omeprazole (PRILOSEC) 40 MG capsule Take by mouth.    . oxyCODONE-acetaminophen  (PERCOCET/ROXICET) 5-325 MG tablet 1 pill every 8-12 hours 60 tablet 0  . Sennosides (EX-LAX PO) Take 2 capsules by mouth as needed (constipation).    . sucralfate (CARAFATE) 1 g tablet Take by mouth.     No current facility-administered medications for this visit.     Past Medical History:  Diagnosis Date  . CLL (chronic lymphocytic leukemia) ([place])   . Diabetes mellitus without complication ([place])   . Hematuria   . Hypertension     Past Surgical History:  Procedure Laterality Date  . CHOLECYSTECTOMY  1983  . ESOPHAGOGASTRODUODENOSCOPY (EGD) WITH PROPOFOL N/A 11/20/2016   Procedure: ESOPHAGOGASTRODUODENOSCOPY (EGD) WITH PROPOFOL;  Surgeon: Manya Silvas, MD;  Location: [facility] ENDOSCOPY;  Service: Endoscopy;  Laterality: N/A;    Social History Social History   Tobacco Use  . Smoking status: Current Every Day Smoker    Packs/day: 1.00    Years: 47.00    Pack years: 47.00    Types: Cigarettes  . Smokeless tobacco: Never Used  Substance Use Topics  . Alcohol use: Yes    Alcohol/week: 3.0 oz    Types: 5 Glasses of wine per week    Comment: 5 a week  . Drug use: No    Family History Family History  Problem Relation Age  of Onset  . Hypertension Sister     No Known Allergies   REVIEW OF SYSTEMS (Negative unless checked)  Constitutional: _0 Weight loss  _1 Fever  _2 Chills Cardiac: _3 Chest pain   _4 Chest pressure   _5 Palpitations   _6 Shortness of breath when laying flat   _7 Shortness of breath at rest   _8 Shortness of breath with exertion. Vascular:  _9 Pain in legs with walking   _10 Pain in legs at rest   _11 Pain in legs when laying flat   _12 Claudication   _13 Pain in feet when walking  _14 Pain in feet at rest  _15 Pain in feet when laying flat   _16 History of DVT   _17 Phlebitis   _18 Swelling in legs   _19 Varicose veins   _20 Non-healing ulcers Pulmonary:   _21 Uses home oxygen   _22 Productive cough   _23 Hemoptysis   _24 Wheeze  _25 COPD   _26 Asthma Neurologic:  _27 Dizziness  _28 Blackouts    _29 Seizures   _30 History of stroke   _31 History of TIA  _32 Aphasia   _33 Temporary blindness   _34 Dysphagia   _35 Weakness or numbness in arms   _36 Weakness or numbness in legs Musculoskeletal:  _37 Arthritis   _38 Joint swelling   _39 Joint pain   _40 Low back pain Hematologic:  _41 Easy bruising  _42 Easy bleeding   _43 Hypercoagulable state   _44 Anemic   Gastrointestinal:  _45 Blood in stool   _46 Vomiting blood  _47 Gastroesophageal reflux/heartburn   _48 Abdominal pain Genitourinary:  _49 Chronic kidney disease   _50 Difficult urination  _51 Frequent urination  _52 Burning with urination   _53 Hematuria Skin:  _54 Rashes   _55 Ulcers   _56 Wounds Psychological:  _57 History of anxiety   _58  History of major depression.  Physical Examination  BP 130/72 (BP Location: Right Arm)   Pulse (!) 52   Resp 16   Wt 88.9 kg (196 lb)   BMI 30.70 kg/m  Gen:  WD/WN, NAD Head: [place]/AT, No temporalis wasting. Ear/Nose/Throat: Hearing grossly intact, nares w/o erythema or drainage, trachea midline Eyes: Conjunctiva clear. Sclera non-icteric Neck: Supple.  No JVD.  Pulmonary:  Good air movement, no use of accessory muscles.  Cardiac: RRR, normal S1, S2 Vascular:  Vessel Right Left  Radial Palpable Palpable                          PT  1+ palpable  1+ palpable  DP  trace palpable  1+ palpable    Musculoskeletal: M/S 5/5 throughout.  No deformity or atrophy.  Trace right lower extremity edema. Neurologic: Sensation grossly intact in extremities.  Symmetrical.  Speech is fluent.  Psychiatric: Judgment intact, Mood & affect appropriate for pt's clinical situation. Dermatologic: No rashes or ulcers noted.  No cellulitis or open wounds.       Labs Recent Results (from the past 2160 hour(s))  Lactate dehydrogenase     Status: None   Collection Time: 04/14/17  1:48 PM  Result Value Ref Range   LDH 134 98 - 192 U/L  Comprehensive metabolic panel     Status: Abnormal   Collection Time: 04/14/17  1:48 PM  Result Value Ref Range   Sodium  138 135 - 145 mmol/L   Potassium 3.8 3.5 - 5.1 mmol/L   Chloride 106 101 - 111 mmol/L   CO2 24 22 - 32 mmol/L   Glucose, Bld 117 (H) 65 - 99 mg/dL   BUN 28 (H) 6 - 20 mg/dL   Creatinine, Ser 1.35 (H) 0.61 - 1.24 mg/dL   Calcium 8.9 8.9 - 10.3 mg/dL   Total  Protein 6.9 6.5 - 8.1 g/dL   Albumin 4.0 3.5 - 5.0 g/dL   AST 25 15 - 41 U/L   ALT 25 17 - 63 U/L   Alkaline Phosphatase 65 38 - 126 U/L   Total Bilirubin 0.3 0.3 - 1.2 mg/dL   GFR calc non Af Amer 52 (L) >60 mL/min   GFR calc Af Amer >60 >60 mL/min    Comment: (NOTE) The eGFR has been calculated using the CKD EPI equation. This calculation has not been validated in all clinical situations. eGFR's persistently <60 mL/min signify possible Chronic Kidney Disease.    Anion gap 8 5 - 15  CBC with Differential     Status: Abnormal   Collection Time: 04/14/17  1:48 PM  Result Value Ref Range   WBC 12.2 (H) 3.8 - 10.6 K/uL   RBC 3.03 (L) 4.40 - 5.90 MIL/uL   Hemoglobin 9.7 (L) 13.0 - 18.0 g/dL   HCT 30.1 (L) 40.0 - 52.0 %   MCV 99.4 80.0 - 100.0 fL   MCH 31.9 26.0 - 34.0 pg   MCHC 32.1 32.0 - 36.0 g/dL   RDW 19.2 (H) 11.5 - 14.5 %   Platelets 153 150 - 440 K/uL   Neutrophils Relative % 61 %   Neutro Abs 7.4 (H) 1.4 - 6.5 K/uL   Lymphocytes Relative 26 %   Lymphs Abs 3.2 1.0 - 3.6 K/uL   Monocytes Relative 11 %   Monocytes Absolute 1.3 (H) 0.2 - 1.0 K/uL   Eosinophils Relative 0 %   Eosinophils Absolute 0.0 0 - 0.7 K/uL   Basophils Relative 2 %   Basophils Absolute 0.2 (H) 0 - 0.1 K/uL    Radiology No results found.    Assessment/Plan  Essential hypertension blood pressure control important in reducing the progression of atherosclerotic disease. On appropriate oral medications.   Type 2 diabetes mellitus with complication (HCC) blood glucose control important in reducing the progression of atherosclerotic disease. Also, involved in wound healing. On appropriate medications.   Atherosclerosis of native arteries  of extremity with rest pain ([place]) His ABIs today were approximately 0.6 on the right but his digital pressure was only 50.  His ABI on the left was 0.79 with a digital pressure of 81.  His waveforms were fairly poor on the right and decent on the left.  Recommend:  The patient has evidence of severe atherosclerotic changes of both lower extremities with rest pain that is associated with preulcerative changes and impending tissue loss of the foot.  This represents a limb threatening ischemia and places the patient at the risk for limb loss.  Patient should undergo angiography of the lower extremities with the hope for intervention for limb salvage.  The risks and benefits as well as the alternative therapies was discussed in detail with the patient.  All questions were answered.  Patient agrees to proceed with angiography.  The patient will follow up with me in the office after the procedure.         Leotis Pain, MD  05/13/2017 5:08 PM    This note was created with Dragon medical transcription system.  Any errors from dictation are purely unintentional

## 2017-05-13 NOTE — Assessment & Plan Note (Signed)
blood pressure control important in reducing the progression of atherosclerotic disease. On appropriate oral medications.  

## 2017-05-13 NOTE — Assessment & Plan Note (Signed)
His ABIs today were approximately 0.6 on the right but his digital pressure was only 50.  His ABI on the left was 0.79 with a digital pressure of 81.  His waveforms were fairly poor on the right and decent on the left.  Recommend:  The patient has evidence of severe atherosclerotic changes of both lower extremities with rest pain that is associated with preulcerative changes and impending tissue loss of the foot.  This represents a limb threatening ischemia and places the patient at the risk for limb loss.  Patient should undergo angiography of the lower extremities with the hope for intervention for limb salvage.  The risks and benefits as well as the alternative therapies was discussed in detail with the patient.  All questions were answered.  Patient agrees to proceed with angiography.  The patient will follow up with me in the office after the procedure.

## 2017-05-13 NOTE — Assessment & Plan Note (Signed)
blood glucose control important in reducing the progression of atherosclerotic disease. Also, involved in wound healing. On appropriate medications.  

## 2017-05-14 ENCOUNTER — Encounter (INDEPENDENT_AMBULATORY_CARE_PROVIDER_SITE_OTHER): Payer: Self-pay

## 2017-05-15 ENCOUNTER — Other Ambulatory Visit (INDEPENDENT_AMBULATORY_CARE_PROVIDER_SITE_OTHER): Payer: Self-pay | Admitting: Vascular Surgery

## 2017-05-16 ENCOUNTER — Other Ambulatory Visit: Payer: Self-pay

## 2017-05-16 ENCOUNTER — Encounter
Admission: RE | Admit: 2017-05-16 | Discharge: 2017-05-16 | Disposition: A | Discharge status: Home / Self Care | Payer: Medicare Other | Source: Ambulatory Visit | Attending: Vascular Surgery | Admitting: Vascular Surgery

## 2017-05-16 DIAGNOSIS — Z856 Personal history of leukemia: Secondary | ICD-10-CM | POA: Diagnosis not present

## 2017-05-16 DIAGNOSIS — Z9049 Acquired absence of other specified parts of digestive tract: Secondary | ICD-10-CM | POA: Diagnosis not present

## 2017-05-16 DIAGNOSIS — Z8249 Family history of ischemic heart disease and other diseases of the circulatory system: Secondary | ICD-10-CM | POA: Diagnosis not present

## 2017-05-16 DIAGNOSIS — I70221 Atherosclerosis of native arteries of extremities with rest pain, right leg: Secondary | ICD-10-CM | POA: Diagnosis present

## 2017-05-16 DIAGNOSIS — Z9889 Other specified postprocedural states: Secondary | ICD-10-CM | POA: Diagnosis not present

## 2017-05-16 DIAGNOSIS — F17210 Nicotine dependence, cigarettes, uncomplicated: Secondary | ICD-10-CM | POA: Diagnosis not present

## 2017-05-16 DIAGNOSIS — E119 Type 2 diabetes mellitus without complications: Secondary | ICD-10-CM | POA: Diagnosis not present

## 2017-05-16 DIAGNOSIS — Z7982 Long term (current) use of aspirin: Secondary | ICD-10-CM | POA: Diagnosis not present

## 2017-05-16 DIAGNOSIS — Z79899 Other long term (current) drug therapy: Secondary | ICD-10-CM | POA: Diagnosis not present

## 2017-05-16 DIAGNOSIS — Z7984 Long term (current) use of oral hypoglycemic drugs: Secondary | ICD-10-CM | POA: Diagnosis not present

## 2017-05-16 DIAGNOSIS — I10 Essential (primary) hypertension: Secondary | ICD-10-CM | POA: Diagnosis not present

## 2017-05-16 LAB — CREATININE, SERUM
CREATININE: 1.17 mg/dL (ref 0.61–1.24)
GFR calc Af Amer: >60 mL/min (ref >60)

## 2017-05-16 LAB — BUN: BUN: 16 mg/dL (ref 6–20)

## 2017-05-18 MED ORDER — CEFAZOLIN SODIUM-DEXTROSE 2-4 GM/100ML-% IV SOLN
2.0000 g | Freq: Once | INTRAVENOUS | Status: AC
  Administered 2017-05-19: 2 g via INTRAVENOUS

## 2017-05-19 ENCOUNTER — Encounter: Payer: Self-pay | Admitting: Emergency Medicine

## 2017-05-19 ENCOUNTER — Ambulatory Visit
Admission: RE | Admit: 2017-05-19 | Discharge: 2017-05-19 | Disposition: A | Discharge status: Home / Self Care | Payer: Medicare Other | Source: Ambulatory Visit | Attending: Vascular Surgery | Admitting: Vascular Surgery

## 2017-05-19 ENCOUNTER — Encounter
Admission: RE | Disposition: A | Discharge status: Home / Self Care | Payer: Self-pay | Source: Ambulatory Visit | Attending: Vascular Surgery

## 2017-05-19 DIAGNOSIS — I70223 Atherosclerosis of native arteries of extremities with rest pain, bilateral legs: Secondary | ICD-10-CM

## 2017-05-19 DIAGNOSIS — Z8249 Family history of ischemic heart disease and other diseases of the circulatory system: Secondary | ICD-10-CM | POA: Insufficient documentation

## 2017-05-19 DIAGNOSIS — I70221 Atherosclerosis of native arteries of extremities with rest pain, right leg: Secondary | ICD-10-CM | POA: Insufficient documentation

## 2017-05-19 DIAGNOSIS — Z7982 Long term (current) use of aspirin: Secondary | ICD-10-CM | POA: Insufficient documentation

## 2017-05-19 DIAGNOSIS — Z9889 Other specified postprocedural states: Secondary | ICD-10-CM | POA: Insufficient documentation

## 2017-05-19 DIAGNOSIS — Z79899 Other long term (current) drug therapy: Secondary | ICD-10-CM | POA: Insufficient documentation

## 2017-05-19 DIAGNOSIS — E119 Type 2 diabetes mellitus without complications: Secondary | ICD-10-CM | POA: Insufficient documentation

## 2017-05-19 DIAGNOSIS — Z7984 Long term (current) use of oral hypoglycemic drugs: Secondary | ICD-10-CM | POA: Insufficient documentation

## 2017-05-19 DIAGNOSIS — I70211 Atherosclerosis of native arteries of extremities with intermittent claudication, right leg: Secondary | ICD-10-CM

## 2017-05-19 DIAGNOSIS — Z856 Personal history of leukemia: Secondary | ICD-10-CM | POA: Insufficient documentation

## 2017-05-19 DIAGNOSIS — F17210 Nicotine dependence, cigarettes, uncomplicated: Secondary | ICD-10-CM | POA: Insufficient documentation

## 2017-05-19 DIAGNOSIS — Z9049 Acquired absence of other specified parts of digestive tract: Secondary | ICD-10-CM | POA: Insufficient documentation

## 2017-05-19 DIAGNOSIS — I10 Essential (primary) hypertension: Secondary | ICD-10-CM | POA: Insufficient documentation

## 2017-05-19 HISTORY — PX: LOWER EXTREMITY ANGIOGRAPHY: CATH118251

## 2017-05-19 LAB — GLUCOSE, CAPILLARY
Glucose-Capillary: 94 mg/dL (ref 65–99)
Glucose-Capillary: 114 mg/dL — ABNORMAL HIGH (ref 65–99)

## 2017-05-19 SURGERY — LOWER EXTREMITY ANGIOGRAPHY
Anesthesia: Moderate Sedation | Laterality: Right

## 2017-05-19 MED ORDER — CLOPIDOGREL BISULFATE 75 MG PO TABS: 75.0000 mg | ORAL_TABLET | Freq: Every day | ORAL | Status: DC

## 2017-05-19 MED ORDER — HYDRALAZINE HCL 20 MG/ML IJ SOLN: 5.0000 mg | INTRAMUSCULAR | Status: DC | PRN

## 2017-05-19 MED ORDER — MIDAZOLAM HCL 5 MG/5ML IJ SOLN
INTRAMUSCULAR | Status: AC
  Filled 2017-05-19: qty 10

## 2017-05-19 MED ORDER — ATORVASTATIN CALCIUM 20 MG PO TABS
20.0000 mg | ORAL_TABLET | Freq: Every day | ORAL | Status: DC
  Filled 2017-05-19: qty 1

## 2017-05-19 MED ORDER — FENTANYL CITRATE (PF) 100 MCG/2ML IJ SOLN
INTRAMUSCULAR | Status: AC
  Filled 2017-05-19: qty 4

## 2017-05-19 MED ORDER — FAMOTIDINE 20 MG PO TABS: 40.0000 mg | ORAL_TABLET | ORAL | Status: DC | PRN

## 2017-05-19 MED ORDER — SODIUM CHLORIDE 0.9 % IV SOLN: INTRAVENOUS | Status: DC

## 2017-05-19 MED ORDER — SODIUM CHLORIDE 0.9% FLUSH: 3.0000 mL | Freq: Two times a day (BID) | INTRAVENOUS | Status: DC

## 2017-05-19 MED ORDER — SODIUM CHLORIDE 0.9% FLUSH: 3.0000 mL | INTRAVENOUS | Status: DC | PRN

## 2017-05-19 MED ORDER — ONDANSETRON HCL 4 MG/2ML IJ SOLN: 4.0000 mg | Freq: Four times a day (QID) | INTRAMUSCULAR | Status: DC | PRN

## 2017-05-19 MED ORDER — SODIUM CHLORIDE 0.9 % IV SOLN: 250.0000 mL | INTRAVENOUS | Status: DC | PRN

## 2017-05-19 MED ORDER — MIDAZOLAM HCL 2 MG/2ML IJ SOLN
INTRAMUSCULAR | Status: DC | PRN
  Administered 2017-05-19 (×4): 1 mg via INTRAVENOUS

## 2017-05-19 MED ORDER — ATORVASTATIN CALCIUM 10 MG PO TABS: 10.0000 mg | ORAL_TABLET | Freq: Every day | ORAL | 11 refills | Status: DC

## 2017-05-19 MED ORDER — HEPARIN SODIUM (PORCINE) 1000 UNIT/ML IJ SOLN
INTRAMUSCULAR | Status: AC
  Filled 2017-05-19: qty 1

## 2017-05-19 MED ORDER — HEPARIN SODIUM (PORCINE) 1000 UNIT/ML IJ SOLN
INTRAMUSCULAR | Status: DC | PRN
  Administered 2017-05-19: 5000 [IU] via INTRAVENOUS

## 2017-05-19 MED ORDER — IOPAMIDOL (ISOVUE-300) INJECTION 61%
INTRAVENOUS | Status: DC | PRN
  Administered 2017-05-19: 90 mL via INTRAVENOUS

## 2017-05-19 MED ORDER — SODIUM CHLORIDE 0.9 % IV SOLN
INTRAVENOUS | Status: DC
  Administered 2017-05-19: 11:00:00 via INTRAVENOUS

## 2017-05-19 MED ORDER — LIDOCAINE-EPINEPHRINE (PF) 1 %-1:200000 IJ SOLN
INTRAMUSCULAR | Status: AC
  Filled 2017-05-19: qty 30

## 2017-05-19 MED ORDER — CLOPIDOGREL BISULFATE 75 MG PO TABS: 75.0000 mg | ORAL_TABLET | Freq: Every day | ORAL | 11 refills | Status: DC

## 2017-05-19 MED ORDER — FENTANYL CITRATE (PF) 100 MCG/2ML IJ SOLN
INTRAMUSCULAR | Status: DC | PRN
  Administered 2017-05-19: 25 ug via INTRAVENOUS
  Administered 2017-05-19 (×2): 50 ug via INTRAVENOUS
  Administered 2017-05-19 (×3): 25 ug via INTRAVENOUS

## 2017-05-19 MED ORDER — HYDROMORPHONE HCL 1 MG/ML IJ SOLN: 1.0000 mg | Freq: Once | INTRAMUSCULAR | Status: DC | PRN

## 2017-05-19 MED ORDER — METHYLPREDNISOLONE SODIUM SUCC 125 MG IJ SOLR: 125.0000 mg | INTRAMUSCULAR | Status: DC | PRN

## 2017-05-19 MED ORDER — LABETALOL HCL 5 MG/ML IV SOLN: 10.0000 mg | INTRAVENOUS | Status: DC | PRN

## 2017-05-19 SURGICAL SUPPLY — 21 items
BALLN LUTONIX 5X220X130 (BALLOONS) ×6
BALLN LUTONIX DCB 5X60X130 (BALLOONS) ×3
BALLN LUTONIX DCB 6X60X130 (BALLOONS) ×3
BALLOON LUTONIX 5X220X130 (BALLOONS) ×2 IMPLANT
BALLOON LUTONIX DCB 5X60X130 (BALLOONS) ×1 IMPLANT
BALLOON LUTONIX DCB 6X60X130 (BALLOONS) ×1 IMPLANT
CATH BEACON 5 .035 65 RIM TIP (CATHETERS) ×3 IMPLANT
CATH BEACON 5 .038 100 VERT TP (CATHETERS) ×3 IMPLANT
CATH PIG 70CM (CATHETERS) ×3 IMPLANT
COVER PROBE U/S 5X48 (MISCELLANEOUS) ×3 IMPLANT
DEVICE PRESTO INFLATION (MISCELLANEOUS) ×3 IMPLANT
DEVICE STARCLOSE SE CLOSURE (Vascular Products) ×3 IMPLANT
GLIDEWIRE ADV .035X260CM (WIRE) ×3 IMPLANT
LIFESTENT SOLO 6X200X135 (Permanent Stent) ×6 IMPLANT
PACK ANGIOGRAPHY (CUSTOM PROCEDURE TRAY) ×3 IMPLANT
SHEATH ANL2 6FRX45 HC (SHEATH) ×3 IMPLANT
SHEATH BRITE TIP 5FRX11 (SHEATH) ×3 IMPLANT
SYR MEDRAD MARK V 150ML (SYRINGE) ×3 IMPLANT
TOWEL OR 17X26 4PK STRL BLUE (TOWEL DISPOSABLE) ×3 IMPLANT
TUBING CONTRAST HIGH PRESS 72 (TUBING) ×3 IMPLANT
WIRE J 3MM .035X145CM (WIRE) ×3 IMPLANT

## 2017-05-19 NOTE — Op Note (Signed)
[facility] Percutaneous Study/Intervention Procedural Note   Date of Surgery: 05/19/2017  Surgeon(s):Onyx Schirmer   Assistants:none  Pre-operative Diagnosis: PAD with rest [name] right leg  Post-operative diagnosis: Same  Procedure(s) Performed: 1. Ultrasound guidance for vascular access left femoral artery 2. Catheter placement into right popliteal artery from left femoral approach 3. Aortogram and selective right lower extremity angiogram 4. Percutaneous transluminal angioplasty of left common iliac artery with 5 mm diameter by 6 cm length Lutonix drug-coated angioplasty balloon 5. Percutaneous transluminal angioplasty of the entire right SFA and above-knee popliteal artery with two 5 mm diameter by 22 cm length Lutonix drug-coated angioplasty balloons  6.  Self-expanding stent placement to the right SFA and above-knee popliteal artery with two 6 mm diameter by 20 cm length life stents for multiple areas of greater than 50% stenosis and dissection after angioplasty  7.  Percutaneous transluminal angioplasty of right external iliac artery with 6 mm diameter by 6 cm length Lutonix drug-coated angioplasty 8. StarClose closure device left femoral artery  EBL: 10 cc  Contrast: 90 cc  Fluoro Time: 7 minutes  Moderate Conscious Sedation Time: approximately 60 minutes using 4 mg of Versed and 200 Mcg of Fentanyl  Indications: Patient is a 67 y.o.male with [name] consistent with rest [name] on the right.  He has had some claudication in both legs over the years with the right being the worst. The patient has noninvasive study showing reduction in both ABIs worse on the right. The patient is brought in for angiography for further evaluation and potential treatment.  Due to the limb threatening nature of the situation, angiogram was performed for attempted limb salvage. Risks and  benefits are discussed and informed consent is obtained  Procedure: The patient was identified and appropriate procedural time out was performed. The patient was then placed supine on the table and prepped and draped in the usual sterile fashion.Moderate conscious sedation was administered during a face to face encounter with the patient throughout the procedure with my supervision of the RN administering medicines and monitoring the patient's vital signs, pulse oximetry, telemetry and mental status throughout from the start of the procedure until the patient was taken to the recovery room. Ultrasound was used to evaluate the left common femoral artery. It was patent . A digital ultrasound image was acquired. A Seldinger needle was used to access the left common femoral artery under direct ultrasound guidance and a permanent image was performed. A 0.035 J wire was advanced without resistance and a 5Fr sheath was placed. Pigtail catheter was placed into the aorta and an AP aortogram was performed. This demonstrated normal renal arteries and an aorta which was calcific but not stenotic.  The left common iliac artery had about a 70-75% stenosis.  The right common iliac artery had a very calcific area but this did not appear to have a stenosis of greater than 50%.  The right external iliac artery did have a stenosis in the 60-70% range.  The left external iliac artery appeared to be widely patent.  Prior to attempting to cross the aortic bifurcation, I elected to go ahead and treat the left common iliac artery stenosis to allow [place] to go up and over.  This was gently treated with a 5 mm diameter by 6 cm length Lutonix drug-coated angioplasty balloon inflated to 12 atm for 1 minute.  Although there was still a roughly 45-50% residual stenosis present, this leg was not nearly as symptomatic and I elected not  to place a stent in this location or use a larger balloon for fear of needing to then access the right  femoral artery to treat the iliacs in a kissing balloon fashion which would jeopardize our infra inguinal treatment on the right leg today.  I then crossed the aortic bifurcation and advanced to the right femoral head. Selective right lower extremity angiogram was then performed. This demonstrated multiple areas throughout the SFA and above-knee popliteal artery of greater than 80% stenosis from highly calcific short segment stenoses.  The worst was in the above-knee popliteal artery and there was then two-vessel runoff distally beyond this. The patient was systemically heparinized and a 6 [place] Ansell sheath was then placed over the [employer] wire. I then used a Kumpe catheter and the advantage wire to navigate through the SFA and popliteal stenoses and confirm intraluminal flow in the.  I then placed the wire again and proceeded with treatment of the right lower extremity.  I used 5 mm diameter by 22 cm length Lutonix drug-coated angioplasty balloon to treat the above-knee popliteal artery and distal SFA.  This was inflated to 10 atm for 1 minute.  A second 5 mm diameter by 22 cm length Lutonix drug-coated angioplasty balloon was then used to treat the proximal and mid SFA.  This was also inflated to 10 atm for 1 minute.  There were 5 areas of greater than 50% stenosis in question, all of which were encompassed by these treatments.  Following angioplasty, there was minimal improvement in these highly calcific stenosis with for residual areas of greater than 50% stenosis in the SFA and above-knee popliteal artery.  I elected to cover these areas with stents.  2 life stents were used that were 6 mm in diameter by 20 cm in length from the above-knee popliteal artery up to the proximal SFA a few centimeters below the origin.  These were postdilated with 5 mm balloons with excellent angiographic completion result and less than 10% residual stenosis.  I then turned my attention to the right external iliac artery  lesion.  This moderate lesion was treated with a 6 mm diameter by 6 cm drug-coated angioplasty balloon inflated to 12 atm for 1 minute.  Completion angiogram following this showed about a 25-30% residual stenosis which was not flow limiting. I elected to terminate the procedure. The sheath was removed and StarClose closure device was deployed in the left femoral artery with excellent hemostatic result. The patient was taken to the recovery room in stable condition having tolerated the procedure well.  Findings:  Aortogram: This demonstrated normal renal arteries and an aorta which was calcific but not stenotic.  The left common iliac artery had about a 70-75% stenosis.  The right common iliac artery had a very calcific area but this did not appear to have a stenosis of greater than 50%.  The right external iliac artery did have a stenosis in the 60-70% range.  The left external iliac artery appeared to be widely patent Right Lower Extremity:Multiple areas throughout the SFA and above-knee popliteal artery of greater than 80% stenosis from highly calcific short segment stenoses.  The worst was in the above-knee popliteal artery and there was then two-vessel runoff distally beyond this.   Disposition: Patient was taken to the recovery room in stable condition having tolerated the procedure well.  Complications: None  James [name] 05/19/2017 1:32 PM   This note was created with Dragon Medical transcription system. Any errors in dictation are purely  unintentional. 

## 2017-05-19 NOTE — H&P (Signed)
[facility] History & Physical Update  The patient was interviewed and re-examined.  The patient's previous History and Physical has been reviewed and is unchanged.  There is no change in the plan of care. We plan to proceed with the scheduled procedure.  Leotis Pain, MD  05/19/2017, 10:51 AM

## 2017-05-20 ENCOUNTER — Ambulatory Visit (INDEPENDENT_AMBULATORY_CARE_PROVIDER_SITE_OTHER): Payer: Medicare Other | Admitting: Vascular Surgery

## 2017-05-20 ENCOUNTER — Encounter: Payer: Self-pay | Admitting: Vascular Surgery

## 2017-05-21 ENCOUNTER — Other Ambulatory Visit: Payer: Self-pay | Admitting: *Deleted

## 2017-05-21 DIAGNOSIS — C9192 Lymphoid leukemia, unspecified, in relapse: Secondary | ICD-10-CM

## 2017-05-21 DIAGNOSIS — C9112 Chronic lymphocytic leukemia of B-cell type in relapse: Secondary | ICD-10-CM

## 2017-05-21 DIAGNOSIS — C8300 Small cell B-cell lymphoma, unspecified site: Secondary | ICD-10-CM

## 2017-05-21 MED ORDER — OXYCODONE-ACETAMINOPHEN 5-325 MG PO TABS: ORAL_TABLET | ORAL | 0 refills | Status: DC

## 2017-05-23 ENCOUNTER — Telehealth (INDEPENDENT_AMBULATORY_CARE_PROVIDER_SITE_OTHER): Payer: Self-pay

## 2017-05-23 NOTE — Telephone Encounter (Signed)
[name] called stating the patient had his procedure on 05/19/17 with Dr. Lucky Cowboy and now his big toe that has no nail is bleeding. I advised her to take the patient to the ED to be evaluated.

## 2017-06-04 MED FILL — IMBRUVICA 280 MG TAB: 280 | 28 days supply | Qty: 28 | Fill #6

## 2017-06-13 ENCOUNTER — Other Ambulatory Visit: Payer: Self-pay | Admitting: *Deleted

## 2017-06-13 DIAGNOSIS — C8300 Small cell B-cell lymphoma, unspecified site: Secondary | ICD-10-CM

## 2017-06-13 DIAGNOSIS — C9192 Lymphoid leukemia, unspecified, in relapse: Secondary | ICD-10-CM

## 2017-06-13 DIAGNOSIS — C9112 Chronic lymphocytic leukemia of B-cell type in relapse: Secondary | ICD-10-CM

## 2017-06-13 MED ORDER — OXYCODONE-ACETAMINOPHEN 5-325 MG PO TABS: ORAL_TABLET | ORAL | 0 refills | Status: DC

## 2017-06-16 ENCOUNTER — Encounter: Payer: Self-pay | Admitting: Internal Medicine

## 2017-06-16 ENCOUNTER — Inpatient Hospital Stay: Payer: Medicare Other

## 2017-06-16 ENCOUNTER — Inpatient Hospital Stay: Payer: Medicare Other | Attending: Internal Medicine | Admitting: Internal Medicine

## 2017-06-16 VITALS — BP 173/81 | HR 60 | Temp 97.9°F | Resp 16 | Wt 190.0 lb

## 2017-06-16 DIAGNOSIS — I129 Hypertensive chronic kidney disease with stage 1 through stage 4 chronic kidney disease, or unspecified chronic kidney disease: Secondary | ICD-10-CM | POA: Insufficient documentation

## 2017-06-16 DIAGNOSIS — C9192 Lymphoid leukemia, unspecified, in relapse: Secondary | ICD-10-CM

## 2017-06-16 DIAGNOSIS — F17210 Nicotine dependence, cigarettes, uncomplicated: Secondary | ICD-10-CM | POA: Insufficient documentation

## 2017-06-16 DIAGNOSIS — E1122 Type 2 diabetes mellitus with diabetic chronic kidney disease: Secondary | ICD-10-CM | POA: Diagnosis not present

## 2017-06-16 DIAGNOSIS — R1013 Epigastric pain: Secondary | ICD-10-CM | POA: Diagnosis not present

## 2017-06-16 DIAGNOSIS — R634 Abnormal weight loss: Secondary | ICD-10-CM | POA: Diagnosis not present

## 2017-06-16 DIAGNOSIS — C9112 Chronic lymphocytic leukemia of B-cell type in relapse: Secondary | ICD-10-CM

## 2017-06-16 DIAGNOSIS — N182 Chronic kidney disease, stage 2 (mild): Secondary | ICD-10-CM | POA: Insufficient documentation

## 2017-06-16 DIAGNOSIS — D631 Anemia in chronic kidney disease: Secondary | ICD-10-CM | POA: Diagnosis not present

## 2017-06-16 LAB — COMPREHENSIVE METABOLIC PANEL
ALBUMIN: 4.4 g/dL (ref 3.5–5.0)
ALT: 10 U/L — ABNORMAL LOW (ref 17–63)
ANION GAP: 8 (ref 5–15)
AST: 14 U/L — ABNORMAL LOW (ref 15–41)
Alkaline Phosphatase: 77 U/L (ref 38–126)
BILIRUBIN TOTAL: 0.5 mg/dL (ref 0.3–1.2)
BUN: 21 mg/dL — ABNORMAL HIGH (ref 6–20)
CO2: 25 mmol/L (ref 22–32)
Calcium: 9.1 mg/dL (ref 8.9–10.3)
Chloride: 106 mmol/L (ref 101–111)
Creatinine, Ser: 1.39 mg/dL — ABNORMAL HIGH (ref 0.61–1.24)
GFR calc Af Amer: 59 mL/min — ABNORMAL LOW (ref >60)
GFR calc non Af Amer: 51 mL/min — ABNORMAL LOW (ref >60)
GLUCOSE: 82 mg/dL (ref 65–99)
POTASSIUM: 5 mmol/L (ref 3.5–5.1)
SODIUM: 139 mmol/L (ref 135–145)
TOTAL PROTEIN: 7.4 g/dL (ref 6.5–8.1)

## 2017-06-16 LAB — CBC WITH DIFFERENTIAL/PLATELET
BASOS ABS: 0.2 10*3/uL — AB (ref 0–0.1)
Basophils Relative: 3 %
Eosinophils Absolute: 0.1 10*3/uL (ref 0–0.7)
Eosinophils Relative: 2 %
HEMATOCRIT: 31.3 % — AB (ref 40.0–52.0)
HEMOGLOBIN: 10 g/dL — AB (ref 13.0–18.0)
LYMPHS PCT: 34 %
Lymphs Abs: 2.2 10*3/uL (ref 1.0–3.6)
MCH: 32.3 pg (ref 26.0–34.0)
MCHC: 31.9 g/dL — ABNORMAL LOW (ref 32.0–36.0)
MCV: 101 fL — AB (ref 80.0–100.0)
MONO ABS: 0.9 10*3/uL (ref 0.2–1.0)
Monocytes Relative: 14 %
NEUTROS ABS: 3.1 10*3/uL (ref 1.4–6.5)
Neutrophils Relative %: 47 %
Platelets: 107 10*3/uL — ABNORMAL LOW (ref 150–440)
RBC: 3.1 MIL/uL — AB (ref 4.40–5.90)
RDW: 18.1 % — AB (ref 11.5–14.5)
WBC: 6.5 10*3/uL (ref 3.8–10.6)

## 2017-06-16 LAB — LACTATE DEHYDROGENASE: LDH: 127 U/L (ref 98–192)

## 2017-06-16 NOTE — Assessment & Plan Note (Addendum)
#   CLL/SLL- relapsed currently on second line therapy with ibrutinib September 2016. SEP 2018- STABLE/ no progression.  # Patient tolerating ibrutinib fairly well except for poorly controlled blood pressures [see discussion below].  No obvious evidence of progression.  # Continue Ibrutinib 280 mg/day; mild anemia hemoglobin `10 platelets - 107 secondary to ibrutinib.   # Elevated Blood pressure-171/86; recommend compliance; recommend taking medications on regular basis. On-norvasc 10 mg/day+ lisinopril-HCTZ 20-25;Metoprolol 25 mg BID   # Anemia/CKD- II/ SLL-] stable.   # Chronic Epigastric Abdominal pain- not improved-STABLE;s/p EGD [[name]]; still on percocet BID. [about every day-- improves with oxycodone]  #Follow-up in 1 month labs CT scan chest and pelvis noncontrast prior.

## 2017-06-16 NOTE — Progress Notes (Signed)
[facility] OFFICE PROGRESS NOTE  Patient Care Team: [name], MD as PCP - General (Internal Medicine)   SUMMARY OF ONCOLOGIC HISTORY: Oncology History   # 2006- CLL STAGE IV; MAY 2011- WBC- 57K;Platelets-99;Hb-12/CT Bulky LN; BMBx- 80% Invol; del 11; START Benda-Ritux x4 [finished Sep 2011];   # July 2015-Progression; Sep 2015-START ibrutinib; CT scan DEC 2015- Improvement LN; Cont Ibrutinib 1101m/d; NOV 2016 CT- 1-2CM LN [mild progression compared to Dec 2015];NOV 2016- FISH peripheral blood- NO MUTATIONS/CD-38 Positive; NOV 7th- CONT IBRUTINIB 2 pills/day; CT AUG 2017- STABLE;  DEC 6th PET- Mild RP LN/ Retrocrural LN   SA skin infection [Oct 2016] s/p clinda     CLL (chronic lymphoid leukemia) in relapse ([facility]     INTERVAL HISTORY:  66year old male patient with history of relapsed CLL currently on second line therapy ibrutinib since September 2015 for follow-up; patient is currently on 288mpills of ibrutinib [secondary to intolerance]- is here for follow-up.  Patient continues to complain of abdominal pain epigastric chronic-states that he has been taking oxycodone with improvement of pain.  This is not getting any worse also not getting any better.  Continues to lose weight.  He denies any night sweats. No recent infections or fevers. No diarrhea.  No bleeding.  No nausea no vomiting. Denies any easy bruising or bleeding.    REVIEW OF SYSTEMS:  A complete 10 point review of system is done which is negative except mentioned above/history of present illness.   PAST MEDICAL HISTORY :  Past Medical History:  Diagnosis Date  . [place]  . [place]  . Hematuria   . Hypertension     PAST SURGICAL HISTORY :   Past Surgical History:  Procedure Laterality Date  . CATARACT EXTRACTION W/ INTRAOCULAR LENS IMPLANT Bilateral   . CHOLECYSTECTOMY  1983  . ESOPHAGOGASTRODUODENOSCOPY (EGD)  WITH PROPOFOL N/A 11/20/2016   Procedure: ESOPHAGOGASTRODUODENOSCOPY (EGD) WITH PROPOFOL;  Surgeon: ElManya SilvasMD;  [facility];  Service: Endoscopy;  Laterality: N/A;  . LOWER EXTREMITY ANGIOGRAPHY Right 05/19/2017   Procedure: LOWER EXTREMITY ANGIOGRAPHY;  Surgeon: DeAlgernon HuxleyMD;  [facility];  Service: Cardiovascular;  Laterality: Right;    FAMILY HISTORY :   Family History  Problem Relation Age of Onset  . Hypertension Sister     SOCIAL HISTORY:   Social History   Tobacco Use  . Smoking status: Current Every Day Smoker    Packs/day: 1.00    Years: 47.00    Pack years: 47.00    Types: Cigarettes  . Smokeless tobacco: Never Used  Substance Use Topics  . Alcohol use: Yes    Alcohol/week: 3.0 oz    Types: 5 Glasses of wine per week    Comment: 5 a week but almost none in last 6 months  . Drug use: No    ALLERGIES:  has No Known Allergies.  MEDICATIONS:  Current Outpatient Medications  Medication Sig Dispense Refill  . amLODipine (NORVASC) 10 MG tablet Take 1 tablet (10 mg total) by mouth daily. 30 tablet 3  . aspirin EC 81 MG tablet Take 81 mg by mouth daily.    . Marland Kitchentorvastatin (LIPITOR) 10 MG tablet Take 1 tablet (10 mg total) by mouth daily. 30 tablet 11  . clopidogrel (PLAVIX) 75 MG tablet Take 1 tablet (75 mg total) by mouth daily. 30 tablet 11  . Cyanocobalamin (B-12 COMPLIANCE INJECTION) 1000 MCG/ML  KIT Inject 1 mL as directed every 4 (four) months.    . Ibrutinib (IMBRUVICA) 280 MG TABS Take 1 capsule by mouth daily. 30 tablet 6  . lisinopril-hydrochlorothiazide (PRINZIDE,ZESTORETIC) 20-25 MG tablet Take 1 tablet by mouth daily. 30 tablet 4  . metFORMIN (GLUMETZA) 1000 MG (MOD) 24 hr tablet Take 1,000 mg by mouth 2 (two) times daily.    . metoprolol tartrate (LOPRESSOR) 25 MG tablet Take 1 tablet (25 mg total) by mouth 2 (two) times daily. 60 tablet 4  . oxyCODONE-acetaminophen (PERCOCET/ROXICET) 5-325 MG tablet 1 pill every  8-12 hours 60 tablet 0  . Sennosides (EX-LAX PO) Take 2 capsules by mouth as needed (constipation).     No current facility-administered medications for this visit.     PHYSICAL EXAMINATION: ECOG PERFORMANCE STATUS: 0 - Asymptomatic  BP (!) 173/81 (BP Location: Left Arm, Patient Position: Sitting)   Pulse 60   Temp 97.9 F (36.6 C) (Tympanic)   Resp 16   Wt 190 lb (86.2 kg)   BMI 28.89 kg/m   Filed Weights   06/16/17 1416  Weight: 190 lb (86.2 kg)    GENERAL: Well-nourished well-developed; Alert, no distress and comfortable.   Alone. EYES: no pallor or icterus OROPHARYNX: no thrush or ulceration; good dentition  NECK: supple, no masses felt LYMPH:  no palpable lymphadenopathy in the cervical, axillary or inguinal regions LUNGS: clear to auscultation and  No wheeze or crackles HEART/CVS: regular rate & rhythm and no murmurs; No lower extremity edema ABDOMEN:abdomen soft, non-tender and normal bowel sounds.  Musculoskeletal:no cyanosis of digits and no clubbing  PSYCH: alert & oriented x 3 with fluent speech NEURO: no focal motor/sensory deficits SKIN:   No rash.  LABORATORY DATA:  I have reviewed the data as listed    Component Value Date/Time   NA 139 06/16/2017 1348   NA 142 05/03/2014 1139   K 5.0 06/16/2017 1348   K 4.7 05/03/2014 1139   CL 106 06/16/2017 1348   CL 110 (H) 05/03/2014 1139   CO2 25 06/16/2017 1348   CO2 24 05/03/2014 1139   GLUCOSE 82 06/16/2017 1348   GLUCOSE 98 05/03/2014 1139   BUN 21 (H) 06/16/2017 1348   BUN 20 (H) 05/03/2014 1139   CREATININE 1.39 (H) 06/16/2017 1348   CREATININE 1.22 08/09/2014 1122   CALCIUM 9.1 06/16/2017 1348   CALCIUM 8.6 05/03/2014 1139   PROT 7.4 06/16/2017 1348   PROT 7.5 05/03/2014 1139   ALBUMIN 4.4 06/16/2017 1348   ALBUMIN 4.1 05/03/2014 1139   AST 14 (L) 06/16/2017 1348   AST 15 05/03/2014 1139   ALT 10 (L) 06/16/2017 1348   ALT 26 05/03/2014 1139   ALKPHOS 77 06/16/2017 1348   ALKPHOS 94 05/03/2014  1139   BILITOT 0.5 06/16/2017 1348   BILITOT 0.5 05/03/2014 1139   GFRNONAA 51 (L) 06/16/2017 1348   GFRNONAA >60 08/09/2014 1122   GFRAA 59 (L) 06/16/2017 1348   GFRAA >60 08/09/2014 1122    No results found for: SPEP, UPEP  Lab Results  Component Value Date   WBC 6.5 06/16/2017   NEUTROABS 3.1 06/16/2017   HGB 10.0 (L) 06/16/2017   HCT 31.3 (L) 06/16/2017   MCV 101.0 (H) 06/16/2017   PLT 107 (L) 06/16/2017      Chemistry      Component Value Date/Time   NA 139 06/16/2017 1348   NA 142 05/03/2014 1139   K 5.0 06/16/2017 1348   K 4.7 05/03/2014 1139  CL 106 06/16/2017 1348   CL 110 (H) 05/03/2014 1139   CO2 25 06/16/2017 1348   CO2 24 05/03/2014 1139   BUN 21 (H) 06/16/2017 1348   BUN 20 (H) 05/03/2014 1139   CREATININE 1.39 (H) 06/16/2017 1348   CREATININE 1.22 08/09/2014 1122      Component Value Date/Time   CALCIUM 9.1 06/16/2017 1348   CALCIUM 8.6 05/03/2014 1139   ALKPHOS 77 06/16/2017 1348   ALKPHOS 94 05/03/2014 1139   AST 14 (L) 06/16/2017 1348   AST 15 05/03/2014 1139   ALT 10 (L) 06/16/2017 1348   ALT 26 05/03/2014 1139   BILITOT 0.5 06/16/2017 1348   BILITOT 0.5 05/03/2014 1139     IMPRESSION: 1. No evidence of residual metabolically active lymphoma in the neck, chest, abdomen or pelvis. 2. No acute findings. 3. Stable incidental findings including Aortic Atherosclerosis (ICD10-I70.0). 4. Stable small left upper lobe nodules from recent chest CT.   Electronically Signed   By: Richardean Sale M.D.   On: 01/07/2017 16:13    ASSESSMENT & PLAN:  CLL (chronic lymphoid leukemia) in relapse ([place]) # CLL/SLL- relapsed currently on second line therapy with ibrutinib September 2016. SEP 2018- STABLE/ no progression.  # Patient tolerating ibrutinib fairly well except for poorly controlled blood pressures [see discussion below].  No obvious evidence of progression.  # Continue Ibrutinib 280 mg/day; mild anemia hemoglobin `10 platelets - 107  secondary to ibrutinib.   # Elevated Blood pressure-171/86; recommend compliance; recommend taking medications on regular basis. On-norvasc 10 mg/day+ lisinopril-HCTZ 20-25;Metoprolol 25 mg BID   # Anemia/CKD- II/ SLL-] stable.   # Chronic Epigastric Abdominal pain- not improved-STABLE;s/p EGD [[name]]; still on percocet BID. [about every day-- improves with oxycodone]  #Follow-up in 1 month labs CT scan chest and pelvis noncontrast prior.    Cammie Sickle, MD 06/16/2017 3:25 PM

## 2017-06-19 ENCOUNTER — Other Ambulatory Visit (INDEPENDENT_AMBULATORY_CARE_PROVIDER_SITE_OTHER): Payer: Self-pay | Admitting: Vascular Surgery

## 2017-06-19 DIAGNOSIS — I739 Peripheral vascular disease, unspecified: Secondary | ICD-10-CM

## 2017-06-23 ENCOUNTER — Telehealth: Payer: Self-pay | Admitting: Pharmacist

## 2017-06-23 ENCOUNTER — Ambulatory Visit (INDEPENDENT_AMBULATORY_CARE_PROVIDER_SITE_OTHER): Payer: Medicare Other

## 2017-06-23 ENCOUNTER — Encounter (INDEPENDENT_AMBULATORY_CARE_PROVIDER_SITE_OTHER): Payer: Self-pay | Admitting: Vascular Surgery

## 2017-06-23 ENCOUNTER — Ambulatory Visit (INDEPENDENT_AMBULATORY_CARE_PROVIDER_SITE_OTHER): Payer: Medicare Other | Admitting: Vascular Surgery

## 2017-06-23 VITALS — BP 147/70 | HR 50 | Resp 16 | Ht 68.0 in | Wt 191.0 lb

## 2017-06-23 DIAGNOSIS — E785 Hyperlipidemia, unspecified: Secondary | ICD-10-CM

## 2017-06-23 DIAGNOSIS — I10 Essential (primary) hypertension: Secondary | ICD-10-CM

## 2017-06-23 DIAGNOSIS — I739 Peripheral vascular disease, unspecified: Secondary | ICD-10-CM

## 2017-06-23 DIAGNOSIS — I70219 Atherosclerosis of native arteries of extremities with intermittent claudication, unspecified extremity: Secondary | ICD-10-CM | POA: Diagnosis not present

## 2017-06-23 NOTE — Progress Notes (Signed)
Subjective:    [name], male    DOB: July 10, 1950, 67 y.o.   MRN: [id number] Chief Complaint  Patient presents with  . Follow-up    4 weeks ABI   Patient presents for his first post procedure follow-up.  The patient is status post a right lower extremity angiogram with intervention on May 19, 2017.  The patient today presents without complaint.  The patient's post procedure course has been unremarkable.  The patient reports an improvement in the claudication and rest pain to the right lower extremity that he was experiencing.  The patient underwent a bilateral ABI which was notable for a market improvement in the right ABI.  There was also improvement to the left ABI.  Today's ABI was right 0.94 and left 0.66.  Previous ABI beginning of January 2019 right ABI 0.64 and left 0.79.  The patient denies any left lower extremity symptoms at this time.  The patient denies any fever, nausea vomiting.   Review of Systems  Constitutional: Negative.   HENT: Negative.   Eyes: Negative.   Respiratory: Negative.   Cardiovascular: Negative.   Gastrointestinal: Negative.   Endocrine: Negative.   Genitourinary: Negative.   Musculoskeletal: Negative.   Skin: Negative.   Allergic/Immunologic: Negative.   Neurological: Negative.   Hematological: Negative.   Psychiatric/Behavioral: Negative.       Objective:   Physical Exam  Constitutional: He is oriented to person, place, and time. He appears well-developed and well-nourished. No distress.  HENT:  Head: Normocephalic and atraumatic.  Eyes: Conjunctivae are normal. Pupils are equal, round, and reactive to light.  Neck: Normal range of motion.  Cardiovascular: Normal rate, regular rhythm, normal heart sounds and intact distal pulses.  Pulses:      Radial pulses are 2+ on the right side, and 2+ on the left side.       Dorsalis pedis pulses are 2+ on the right side, and 2+ on the left side.       Posterior tibial pulses are 2+  on the right side, and 2+ on the left side.  Pulmonary/Chest: Effort normal and breath sounds normal.  Musculoskeletal: Normal range of motion. He exhibits no edema.  Neurological: He is alert and oriented to person, place, and time.  Skin: Skin is warm and dry. He is not diaphoretic.  Psychiatric: He has a normal mood and affect. His behavior is normal. Judgment and thought content normal.  Vitals reviewed.  BP (!) 147/70 (BP Location: Right Arm, Patient Position: Sitting)   Pulse (!) 50   Resp 16   Ht 5' 8" (1.727 m)   Wt 191 lb (86.6 kg)   BMI 29.04 kg/m   Past Medical History:  Diagnosis Date  . CLL (chronic lymphocytic leukemia) ([place])   . Diabetes mellitus without complication ([place])   . Hematuria   . Hypertension    Social History   Socioeconomic History  . Marital status: Married    Spouse name: Not on file  . Number of children: Not on file  . Years of education: Not on file  . Highest education level: Not on file  Social Needs  . Financial resource strain: Not on file  . Food insecurity - worry: Not on file  . Food insecurity - inability: Not on file  . Transportation needs - medical: Not on file  . Transportation needs - non-medical: Not on file  Occupational History  . Not on file  Tobacco Use  . Smoking status:  Current Every Day Smoker    Packs/day: 1.00    Years: 47.00    Pack years: 47.00    Types: Cigarettes  . Smokeless tobacco: Never Used  Substance and Sexual Activity  . Alcohol use: Yes    Alcohol/week: 3.0 oz    Types: 5 Glasses of wine per week    Comment: 5 a week but almost none in last 6 months  . Drug use: No  . Sexual activity: No    Partners: Female  Other Topics Concern  . Not on file  Social History Narrative  . Not on file   Past Surgical History:  Procedure Laterality Date  . CATARACT EXTRACTION W/ INTRAOCULAR LENS IMPLANT Bilateral   . CHOLECYSTECTOMY  1983  . ESOPHAGOGASTRODUODENOSCOPY (EGD) WITH PROPOFOL N/A 11/20/2016    Procedure: ESOPHAGOGASTRODUODENOSCOPY (EGD) WITH PROPOFOL;  Surgeon: Manya Silvas, MD;  Location: [facility] ENDOSCOPY;  Service: Endoscopy;  Laterality: N/A;  . LOWER EXTREMITY ANGIOGRAPHY Right 05/19/2017   Procedure: LOWER EXTREMITY ANGIOGRAPHY;  Surgeon: Algernon Huxley, MD;  Location: [facility] LAB;  Service: Cardiovascular;  Laterality: Right;   Family History  Problem Relation Age of Onset  . Hypertension Sister    No Known Allergies     Assessment & Plan:  Patient presents for his first post procedure follow-up.  The patient is status post a right lower extremity angiogram with intervention on May 19, 2017.  The patient today presents without complaint.  The patient's post procedure course has been unremarkable.  The patient reports an improvement in the claudication and rest pain to the right lower extremity that he was experiencing.  The patient underwent a bilateral ABI which was notable for a market improvement in the right ABI.  There was also improvement to the left ABI.  Today's ABI was right 0.94 and left 0.66.  Previous ABI beginning of January 2019 right ABI 0.64 and left 0.79.  The patient denies any left lower extremity symptoms at this time.  The patient denies any fever, nausea vomiting.  1. Atherosclerotic peripheral vascular disease with intermittent claudication (HCC) - Stable Patient status post a right lower extremity on May 19, 2017  The patient presents today with a market improvement in his right lower extremity claudication and rest pain. The patient underwent a bilateral ABI which movement in both arterial blood flow to both extremities. The patient should return in 6 months to undergo an aortoiliac and right lower extremity arterial duplex I have discussed with the patient at length the risk factors for and pathogenesis of atherosclerotic disease and encouraged a healthy diet, regular exercise regimen and blood pressure / glucose control.  The patient  was encouraged to call the office in the interim if he experiences any claudication like symptoms, rest pain or ulcers to his feet / toes.  - VAS US AORTA/IVC/ILIACS; Future - VAS [place] LOWER EXTREMITY ARTERIAL DUPLEX; Future  2. Essential hypertension - Stable Encouraged good control as its slows the progression of atherosclerotic disease  3. Hyperlipidemia, unspecified hyperlipidemia type - Stable Encouraged good control as its slows the progression of atherosclerotic disease  Current Outpatient Medications on File Prior to Visit  Medication Sig Dispense Refill  . amLODipine (NORVASC) 10 MG tablet Take 1 tablet (10 mg total) by mouth daily. 30 tablet 3  . aspirin EC 81 MG tablet Take 81 mg by mouth daily.    Marland Kitchen atorvastatin (LIPITOR) 10 MG tablet Take 1 tablet (10 mg total) by mouth daily. 30 tablet  11  . clopidogrel (PLAVIX) 75 MG tablet Take 1 tablet (75 mg total) by mouth daily. 30 tablet 11  . Cyanocobalamin (B-12 COMPLIANCE INJECTION) 1000 MCG/ML KIT Inject 1 mL as directed every 4 (four) months.    . Ibrutinib (IMBRUVICA) 280 MG TABS Take 1 capsule by mouth daily. 30 tablet 6  . iron polysaccharides (NU-IRON) 150 MG capsule Take by mouth.    Marland Kitchen lisinopril-hydrochlorothiazide (PRINZIDE,ZESTORETIC) 20-25 MG tablet Take 1 tablet by mouth daily. 30 tablet 4  . metFORMIN (GLUMETZA) 1000 MG (MOD) 24 hr tablet Take 1,000 mg by mouth 2 (two) times daily.    . metoprolol tartrate (LOPRESSOR) 25 MG tablet Take 1 tablet (25 mg total) by mouth 2 (two) times daily. 60 tablet 4  . omeprazole (PRILOSEC) 40 MG capsule Take by mouth.    . oxyCODONE-acetaminophen (PERCOCET/ROXICET) 5-325 MG tablet 1 pill every 8-12 hours 60 tablet 0  . Sennosides (EX-LAX PO) Take 2 capsules by mouth as needed (constipation).    . sucralfate (CARAFATE) 1 g tablet Take by mouth.     No current facility-administered medications on file prior to visit.    There are no Patient Instructions on file for this visit. No  Follow-up on file.  Christan Defranco A Hanif Radin, PA-C

## 2017-06-23 NOTE — Telephone Encounter (Signed)
Oral Chemotherapy Pharmacist Encounter  Follow-Up Form  Called patient today to follow up regarding patient's oral chemotherapy medication: Ibrutinib  Original Start date of oral chemotherapy: 12/2013  Pt reports 1 tablets/doses of ibrutinib missed so far this month.  Missed dose(s) attributed to: simply forgetting. Reviewed plan for missed doses.   Pt reports the following side effects: none reported  Recent labs reviewed: CBC and LDH from 06/16/17  New medications?: sucralfate, iron pills, no DDIs with ibrutinib  Other Issues: none reported  Patient knows to call the office with questions or concerns. Oral Oncology Clinic will continue to follow.  Darl Pikes, PharmD, BCPS Hematology/Oncology Clinical Pharmacist [facility] [phone]  06/23/2017 11:13 AM

## 2017-06-23 NOTE — Telephone Encounter (Signed)
Oral Chemotherapy Pharmacist Encounter   Attempted to reach patient for follow up on oral medication: Ibrutinib. Patient was still asleep, wife asked that I call back later.   Darl Pikes, PharmD, BCPS Hematology/Oncology Clinical Pharmacist [facility] [phone]  06/23/2017 8:44 AM

## 2017-06-30 ENCOUNTER — Other Ambulatory Visit: Payer: Self-pay | Admitting: *Deleted

## 2017-06-30 ENCOUNTER — Other Ambulatory Visit: Payer: Self-pay | Admitting: Internal Medicine

## 2017-06-30 DIAGNOSIS — C8300 Small cell B-cell lymphoma, unspecified site: Secondary | ICD-10-CM

## 2017-06-30 DIAGNOSIS — C9192 Lymphoid leukemia, unspecified, in relapse: Secondary | ICD-10-CM

## 2017-06-30 DIAGNOSIS — C9112 Chronic lymphocytic leukemia of B-cell type in relapse: Secondary | ICD-10-CM

## 2017-06-30 MED FILL — IMBRUVICA 280 MG TAB: 280 | 28 days supply | Qty: 28 | Fill #0

## 2017-07-01 ENCOUNTER — Other Ambulatory Visit: Payer: Self-pay | Admitting: *Deleted

## 2017-07-01 DIAGNOSIS — C9192 Lymphoid leukemia, unspecified, in relapse: Secondary | ICD-10-CM

## 2017-07-01 DIAGNOSIS — C9112 Chronic lymphocytic leukemia of B-cell type in relapse: Secondary | ICD-10-CM

## 2017-07-01 DIAGNOSIS — C8300 Small cell B-cell lymphoma, unspecified site: Secondary | ICD-10-CM

## 2017-07-01 MED ORDER — OXYCODONE-ACETAMINOPHEN 5-325 MG PO TABS: ORAL_TABLET | ORAL | 0 refills | Status: DC

## 2017-07-10 ENCOUNTER — Ambulatory Visit
Admission: RE | Admit: 2017-07-10 | Discharge: 2017-07-10 | Disposition: A | Discharge status: Home / Self Care | Payer: Medicare Other | Source: Ambulatory Visit | Attending: Internal Medicine | Admitting: Internal Medicine

## 2017-07-10 DIAGNOSIS — I700 Atherosclerosis of aorta: Secondary | ICD-10-CM | POA: Diagnosis not present

## 2017-07-10 DIAGNOSIS — N400 Enlarged prostate without lower urinary tract symptoms: Secondary | ICD-10-CM | POA: Insufficient documentation

## 2017-07-10 DIAGNOSIS — D709 Neutropenia, unspecified: Secondary | ICD-10-CM | POA: Insufficient documentation

## 2017-07-10 DIAGNOSIS — N200 Calculus of kidney: Secondary | ICD-10-CM | POA: Insufficient documentation

## 2017-07-10 DIAGNOSIS — R9389 Abnormal findings on diagnostic imaging of other specified body structures: Secondary | ICD-10-CM | POA: Diagnosis not present

## 2017-07-10 DIAGNOSIS — C9192 Lymphoid leukemia, unspecified, in relapse: Secondary | ICD-10-CM | POA: Insufficient documentation

## 2017-07-10 DIAGNOSIS — I2510 Atherosclerotic heart disease of native coronary artery without angina pectoris: Secondary | ICD-10-CM | POA: Diagnosis not present

## 2017-07-10 DIAGNOSIS — C9112 Chronic lymphocytic leukemia of B-cell type in relapse: Secondary | ICD-10-CM

## 2017-07-10 DIAGNOSIS — K529 Noninfective gastroenteritis and colitis, unspecified: Secondary | ICD-10-CM | POA: Diagnosis not present

## 2017-07-10 DIAGNOSIS — J439 Emphysema, unspecified: Secondary | ICD-10-CM | POA: Insufficient documentation

## 2017-07-11 ENCOUNTER — Telehealth: Payer: Self-pay | Admitting: Internal Medicine

## 2017-07-11 DIAGNOSIS — R109 Unspecified abdominal pain: Secondary | ICD-10-CM

## 2017-07-11 NOTE — Addendum Note (Signed)
Addended by: Sabino Gasser on: 07/11/2017 03:55 PM   Modules accepted: Orders

## 2017-07-11 NOTE — Telephone Encounter (Signed)
Spoke to pt: he feels no different [chronic mild abdominal pain; no diarrhea]; recommend follow up as planned on 3/18.  Please add lactic acid/ cbc/cmp/ CRP;LDH/ lab appt. Thx

## 2017-07-14 ENCOUNTER — Encounter: Payer: Self-pay | Admitting: Internal Medicine

## 2017-07-14 ENCOUNTER — Inpatient Hospital Stay: Payer: Medicare Other | Attending: Internal Medicine

## 2017-07-14 ENCOUNTER — Inpatient Hospital Stay (HOSPITAL_BASED_OUTPATIENT_CLINIC_OR_DEPARTMENT_OTHER): Payer: Medicare Other | Admitting: Internal Medicine

## 2017-07-14 DIAGNOSIS — R197 Diarrhea, unspecified: Secondary | ICD-10-CM

## 2017-07-14 DIAGNOSIS — E1122 Type 2 diabetes mellitus with diabetic chronic kidney disease: Secondary | ICD-10-CM

## 2017-07-14 DIAGNOSIS — J449 Chronic obstructive pulmonary disease, unspecified: Secondary | ICD-10-CM | POA: Insufficient documentation

## 2017-07-14 DIAGNOSIS — C9192 Lymphoid leukemia, unspecified, in relapse: Secondary | ICD-10-CM

## 2017-07-14 DIAGNOSIS — K529 Noninfective gastroenteritis and colitis, unspecified: Secondary | ICD-10-CM | POA: Diagnosis not present

## 2017-07-14 DIAGNOSIS — I129 Hypertensive chronic kidney disease with stage 1 through stage 4 chronic kidney disease, or unspecified chronic kidney disease: Secondary | ICD-10-CM | POA: Diagnosis not present

## 2017-07-14 DIAGNOSIS — N189 Chronic kidney disease, unspecified: Secondary | ICD-10-CM

## 2017-07-14 DIAGNOSIS — R1013 Epigastric pain: Secondary | ICD-10-CM | POA: Insufficient documentation

## 2017-07-14 DIAGNOSIS — C9112 Chronic lymphocytic leukemia of B-cell type in relapse: Secondary | ICD-10-CM | POA: Insufficient documentation

## 2017-07-14 DIAGNOSIS — D631 Anemia in chronic kidney disease: Secondary | ICD-10-CM | POA: Diagnosis not present

## 2017-07-14 DIAGNOSIS — F17210 Nicotine dependence, cigarettes, uncomplicated: Secondary | ICD-10-CM | POA: Diagnosis not present

## 2017-07-14 DIAGNOSIS — J40 Bronchitis, not specified as acute or chronic: Secondary | ICD-10-CM | POA: Insufficient documentation

## 2017-07-14 DIAGNOSIS — R109 Unspecified abdominal pain: Secondary | ICD-10-CM

## 2017-07-14 LAB — CBC WITH DIFFERENTIAL/PLATELET
Basophils Absolute: 0.1 10*3/uL (ref 0–0.1)
Basophils Relative: 2 %
EOS ABS: 0.1 10*3/uL (ref 0–0.7)
EOS PCT: 2 %
HCT: 29 % — ABNORMAL LOW (ref 40.0–52.0)
HEMOGLOBIN: 9.4 g/dL — AB (ref 13.0–18.0)
LYMPHS ABS: 1.8 10*3/uL (ref 1.0–3.6)
LYMPHS PCT: 30 %
MCH: 32.9 pg (ref 26.0–34.0)
MCHC: 32.5 g/dL (ref 32.0–36.0)
MCV: 101.1 fL — ABNORMAL HIGH (ref 80.0–100.0)
Monocytes Absolute: 0.7 10*3/uL (ref 0.2–1.0)
Monocytes Relative: 13 %
Neutro Abs: 3 10*3/uL (ref 1.4–6.5)
Neutrophils Relative %: 53 %
PLATELETS: 101 10*3/uL — AB (ref 150–440)
RBC: 2.86 MIL/uL — AB (ref 4.40–5.90)
RDW: 18.6 % — ABNORMAL HIGH (ref 11.5–14.5)
WBC: 5.8 10*3/uL (ref 3.8–10.6)

## 2017-07-14 LAB — COMPREHENSIVE METABOLIC PANEL
ALT: 10 U/L — ABNORMAL LOW (ref 17–63)
AST: 13 U/L — ABNORMAL LOW (ref 15–41)
Albumin: 4.2 g/dL (ref 3.5–5.0)
Alkaline Phosphatase: 70 U/L (ref 38–126)
Anion gap: 9 (ref 5–15)
BUN: 17 mg/dL (ref 6–20)
CHLORIDE: 106 mmol/L (ref 101–111)
CO2: 22 mmol/L (ref 22–32)
CREATININE: 1.13 mg/dL (ref 0.61–1.24)
Calcium: 8.9 mg/dL (ref 8.9–10.3)
GFR calc Af Amer: >60 mL/min (ref >60)
Glucose, Bld: 102 mg/dL — ABNORMAL HIGH (ref 65–99)
POTASSIUM: 4.3 mmol/L (ref 3.5–5.1)
Sodium: 137 mmol/L (ref 135–145)
TOTAL PROTEIN: 7.1 g/dL (ref 6.5–8.1)
Total Bilirubin: 0.5 mg/dL (ref 0.3–1.2)

## 2017-07-14 LAB — C-REACTIVE PROTEIN: CRP: <0.8 mg/dL (ref <1.0)

## 2017-07-14 LAB — IRON AND TIBC
Iron: 86 ug/dL (ref 45–182)
Saturation Ratios: 20 % (ref 17.9–39.5)
TIBC: 424 ug/dL (ref 250–450)
UIBC: 338 ug/dL

## 2017-07-14 LAB — FERRITIN: FERRITIN: 30 ng/mL (ref 24–336)

## 2017-07-14 LAB — LACTATE DEHYDROGENASE: LDH: 134 U/L (ref 98–192)

## 2017-07-14 LAB — LACTIC ACID, PLASMA: LACTIC ACID, VENOUS: 1.1 mmol/L (ref 0.5–1.9)

## 2017-07-14 NOTE — Progress Notes (Signed)
[place] OFFICE PROGRESS NOTE  Patient Care Team: [name], MD as PCP - General (Internal Medicine)   SUMMARY OF ONCOLOGIC HISTORY: Oncology History   # 2006- CLL STAGE IV; MAY 2011- WBC- 57K;Platelets-99;Hb-12/CT Bulky LN; BMBx- 80% Invol; del 11; START Benda-Ritux x4 [finished Sep 2011];   # July 2015-Progression; Sep 2015-START ibrutinib; CT scan DEC 2015- Improvement LN; Cont Ibrutinib '140mg'$ /d; NOV 2016 CT- 1-2CM LN [mild progression compared to Dec 2015];NOV 2016- FISH peripheral blood- NO MUTATIONS/CD-38 Positive; NOV 7th- CONT IBRUTINIB 2 pills/day; CT AUG 2017- STABLE;  DEC 6th PET- Mild RP LN/ Retrocrural LN   SA skin infection [Oct 2016] s/p clinda     CLL (chronic lymphoid leukemia) in relapse [facility])     INTERVAL HISTORY:  67 year old male patient with history of relapsed CLL currently on second line therapy ibrutinib since September 2015 for follow-up; patient is currently on '280mg'$  pills of ibrutinib [secondary to intolerance]- is here for follow-up/CT scan.  Patient states that she has been having diarrhea up to 1-2 loose stools a day over the last 2 months.  He denies any significant abdominal cramping.  He denies any blood in stools.  No fevers or chills. Recently has not been treated with any antibiotics.  No weight loss.  Patient continues to have chronic abdominal pain; for which he takes oxycodone.   He denies any night sweats. No recent infections or fevers. No nausea no vomiting. Denies any easy bruising or bleeding.    REVIEW OF SYSTEMS:  A complete 10 point review of system is done which is negative except mentioned above/history of present illness.   PAST MEDICAL HISTORY :  Past Medical History:  Diagnosis Date  . CLL (chronic lymphocytic leukemia) ([place])   . Diabetes mellitus without complication ([place])   . Hematuria   . Hypertension     PAST SURGICAL HISTORY :   Past Surgical History:  Procedure Laterality Date  . CATARACT  EXTRACTION W/ INTRAOCULAR LENS IMPLANT Bilateral   . CHOLECYSTECTOMY  1983  . ESOPHAGOGASTRODUODENOSCOPY (EGD) WITH PROPOFOL N/A 11/20/2016   Procedure: ESOPHAGOGASTRODUODENOSCOPY (EGD) WITH PROPOFOL;  Surgeon: Manya Silvas, MD;  Location: [facility] ENDOSCOPY;  Service: Endoscopy;  Laterality: N/A;  . LOWER EXTREMITY ANGIOGRAPHY Right 05/19/2017   Procedure: LOWER EXTREMITY ANGIOGRAPHY;  Surgeon: Algernon Huxley, MD;  Location: [place] CV LAB;  Service: Cardiovascular;  Laterality: Right;    FAMILY HISTORY :   Family History  Problem Relation Age of Onset  . Hypertension Sister     SOCIAL HISTORY:   Social History   Tobacco Use  . Smoking status: Current Every Day Smoker    Packs/day: 1.00    Years: 47.00    Pack years: 47.00    Types: Cigarettes  . Smokeless tobacco: Never Used  Substance Use Topics  . Alcohol use: Yes    Alcohol/week: 3.0 oz    Types: 5 Glasses of wine per week    Comment: 5 a week but almost none in last 6 months  . Drug use: No    ALLERGIES:  has No Known Allergies.  MEDICATIONS:  Current Outpatient Medications  Medication Sig Dispense Refill  . amLODipine (NORVASC) 10 MG tablet Take 1 tablet (10 mg total) by mouth daily. 30 tablet 3  . aspirin EC 81 MG tablet Take 81 mg by mouth daily.    Marland Kitchen atorvastatin (LIPITOR) 10 MG tablet Take 1 tablet (10 mg total) by mouth daily. 30 tablet 11  . clopidogrel (  PLAVIX) 75 MG tablet Take 1 tablet (75 mg total) by mouth daily. 30 tablet 11  . Cyanocobalamin (B-12 COMPLIANCE INJECTION) 1000 MCG/ML KIT Inject 1 mL as directed every 4 (four) months.    . IMBRUVICA 280 MG TABS TAKE 1 CAPSULE BY MOUTH DAILY. 28 tablet 6  . iron polysaccharides (NU-IRON) 150 MG capsule Take by mouth.    Marland Kitchen lisinopril-hydrochlorothiazide (PRINZIDE,ZESTORETIC) 20-25 MG tablet Take 1 tablet by mouth daily. 30 tablet 4  . metFORMIN (GLUMETZA) 1000 MG (MOD) 24 hr tablet Take 1,000 mg by mouth 2 (two) times daily.    . metoprolol tartrate  (LOPRESSOR) 25 MG tablet Take 1 tablet (25 mg total) by mouth 2 (two) times daily. 60 tablet 4  . omeprazole (PRILOSEC) 40 MG capsule Take by mouth.    . oxyCODONE-acetaminophen (PERCOCET/ROXICET) 5-325 MG tablet 1 pill every 8-12 hours 60 tablet 0  . Sennosides (EX-LAX PO) Take 2 capsules by mouth as needed (constipation).    . sucralfate (CARAFATE) 1 g tablet Take by mouth.     No current facility-administered medications for this visit.     PHYSICAL EXAMINATION: ECOG PERFORMANCE STATUS: 0 - Asymptomatic  BP (!) 158/92 (BP Location: Left Arm, Patient Position: Sitting)   Pulse (!) 41   Temp 97.8 F (36.6 C) (Tympanic)   Resp 16   Wt 191 lb (86.6 kg)   BMI 29.04 kg/m   Filed Weights   07/14/17 1343 07/14/17 1344  Weight: 191 lb (86.6 kg) 191 lb (86.6 kg)    GENERAL: Well-nourished well-developed; Alert, no distress and comfortable.   Alone. EYES: no pallor or icterus OROPHARYNX: no thrush or ulceration; good dentition  NECK: supple, no masses felt LYMPH:  no palpable lymphadenopathy in the cervical, axillary or inguinal regions LUNGS: clear to auscultation and  No wheeze or crackles HEART/CVS: regular rate & rhythm and no murmurs; No lower extremity edema ABDOMEN:abdomen soft, non-tender and normal bowel sounds.  Musculoskeletal:no cyanosis of digits and no clubbing  PSYCH: alert & oriented x 3 with fluent speech NEURO: no focal motor/sensory deficits SKIN:   No rash.  LABORATORY DATA:  I have reviewed the data as listed    Component Value Date/Time   NA 137 07/14/2017 1327   NA 142 05/03/2014 1139   K 4.3 07/14/2017 1327   K 4.7 05/03/2014 1139   CL 106 07/14/2017 1327   CL 110 (H) 05/03/2014 1139   CO2 22 07/14/2017 1327   CO2 24 05/03/2014 1139   GLUCOSE 102 (H) 07/14/2017 1327   GLUCOSE 98 05/03/2014 1139   BUN 17 07/14/2017 1327   BUN 20 (H) 05/03/2014 1139   CREATININE 1.13 07/14/2017 1327   CREATININE 1.22 08/09/2014 1122   CALCIUM 8.9 07/14/2017 1327    CALCIUM 8.6 05/03/2014 1139   PROT 7.1 07/14/2017 1327   PROT 7.5 05/03/2014 1139   ALBUMIN 4.2 07/14/2017 1327   ALBUMIN 4.1 05/03/2014 1139   AST 13 (L) 07/14/2017 1327   AST 15 05/03/2014 1139   ALT 10 (L) 07/14/2017 1327   ALT 26 05/03/2014 1139   ALKPHOS 70 07/14/2017 1327   ALKPHOS 94 05/03/2014 1139   BILITOT 0.5 07/14/2017 1327   BILITOT 0.5 05/03/2014 1139   GFRNONAA >60 07/14/2017 1327   GFRNONAA >60 08/09/2014 1122   GFRAA >60 07/14/2017 1327   GFRAA >60 08/09/2014 1122    No results found for: SPEP, UPEP  Lab Results  Component Value Date   WBC 5.8 07/14/2017   NEUTROABS  3.0 07/14/2017   HGB 9.4 (L) 07/14/2017   HCT 29.0 (L) 07/14/2017   MCV 101.1 (H) 07/14/2017   PLT 101 (L) 07/14/2017      Chemistry      Component Value Date/Time   NA 137 07/14/2017 1327   NA 142 05/03/2014 1139   K 4.3 07/14/2017 1327   K 4.7 05/03/2014 1139   CL 106 07/14/2017 1327   CL 110 (H) 05/03/2014 1139   CO2 22 07/14/2017 1327   CO2 24 05/03/2014 1139   BUN 17 07/14/2017 1327   BUN 20 (H) 05/03/2014 1139   CREATININE 1.13 07/14/2017 1327   CREATININE 1.22 08/09/2014 1122      Component Value Date/Time   CALCIUM 8.9 07/14/2017 1327   CALCIUM 8.6 05/03/2014 1139   ALKPHOS 70 07/14/2017 1327   ALKPHOS 94 05/03/2014 1139   AST 13 (L) 07/14/2017 1327   AST 15 05/03/2014 1139   ALT 10 (L) 07/14/2017 1327   ALT 26 05/03/2014 1139   BILITOT 0.5 07/14/2017 1327   BILITOT 0.5 05/03/2014 1139     IMPRESSION: 1. No evidence of residual metabolically active lymphoma in the neck, chest, abdomen or pelvis. 2. No acute findings. 3. Stable incidental findings including Aortic Atherosclerosis (ICD10-I70.0). 4. Stable small left upper lobe nodules from recent chest CT.   Electronically Signed   By: Richardean Sale M.D.   On: 01/07/2017 16:13    ASSESSMENT & PLAN:  CLL (chronic lymphoid leukemia) in relapse ([place]) # CLL/SLL- relapsed currently on second line therapy  with ibrutinib September 2016.  March 2019-CT scan shows no progressive/worsening lymphadenopathy; however shows "colitis"-see discussion below  #Given the "colitis"-noted on the CT scan recommend holding ibrutinib at this time especially as CLL/SLL is under good control.  # "Colitis" on CT scan; HOLD ibrutinib; check c.diff/ GI PCR.   # cough sec to bronchitis/COPD- recommend stop smoking.   #Elevated blood pressure continue home medications follow-up with PCP  # Anemia/CKD- II/ SLL-] hemoglobin 9.9/platelets 120s question secondary to ibrutinib.  Check iron studies.  # Chronic Epigastric Abdominal pain- not improved-STABLE;s/p EGD [[name]]; still on percocet BID.  Unclear etiology; will discuss regarding urine and blood testing at next visit.   # follow up in 2-3 weeks/ no labs. STOP; add irons tdies./ ferritin labs today. C.diff/GI PCR.   # I reviewed the blood work- with the patient in detail; also reviewed the imaging independently [as summarized above]; and with the patient in detail.      Cammie Sickle, MD 07/14/2017 6:23 PM

## 2017-07-14 NOTE — Assessment & Plan Note (Addendum)
#   CLL/SLL- relapsed currently on second line therapy with ibrutinib September 2016.  March 2019-CT scan shows no progressive/worsening lymphadenopathy; however shows "colitis"-see discussion below  #Given the "colitis"-noted on the CT scan recommend holding ibrutinib at this time especially as CLL/SLL is under good control.  # "Colitis" on CT scan; HOLD ibrutinib; check c.diff/ GI PCR.   # cough sec to bronchitis/COPD- recommend stop smoking.   #Elevated blood pressure continue home medications follow-up with PCP  # Anemia/CKD- II/ SLL-] hemoglobin 9.9/platelets 120s question secondary to ibrutinib.  Check iron studies.  # Chronic Epigastric Abdominal pain- not improved-STABLE;s/p EGD [[name]]; still on percocet BID.  Unclear etiology; will discuss regarding urine and blood testing at next visit.   # follow up in 2-3 weeks/ no labs. STOP; add irons tdies./ ferritin labs today. C.diff/GI PCR.   # I reviewed the blood work- with the patient in detail; also reviewed the imaging independently [as summarized above]; and with the patient in detail.

## 2017-07-16 ENCOUNTER — Other Ambulatory Visit: Payer: Self-pay

## 2017-07-16 ENCOUNTER — Telehealth: Payer: Self-pay | Admitting: *Deleted

## 2017-07-16 ENCOUNTER — Other Ambulatory Visit: Payer: Self-pay | Admitting: *Deleted

## 2017-07-16 DIAGNOSIS — C9192 Lymphoid leukemia, unspecified, in relapse: Secondary | ICD-10-CM

## 2017-07-16 DIAGNOSIS — C9112 Chronic lymphocytic leukemia of B-cell type in relapse: Secondary | ICD-10-CM | POA: Diagnosis not present

## 2017-07-16 DIAGNOSIS — K529 Noninfective gastroenteritis and colitis, unspecified: Secondary | ICD-10-CM

## 2017-07-16 LAB — C DIFFICILE QUICK SCREEN W PCR REFLEX
C DIFFICILE (CDIFF) INTERP: NOT DETECTED
C Diff antigen: NEGATIVE
C Diff toxin: NEGATIVE

## 2017-07-16 MED ORDER — METRONIDAZOLE 500 MG PO TABS: 500.0000 mg | ORAL_TABLET | Freq: Three times a day (TID) | ORAL | 0 refills | Status: DC

## 2017-07-16 MED ORDER — CIPROFLOXACIN HCL 500 MG PO TABS: 500.0000 mg | ORAL_TABLET | Freq: Two times a day (BID) | ORAL | 0 refills | Status: DC

## 2017-07-16 NOTE — Telephone Encounter (Signed)
-----   Message from Cammie Sickle, MD sent at 07/16/2017  1:45 PM EDT ----- Please inform patient that he is negative for C. difficile infection; recommend antibiotics ciprofloxacin 500 BID x10 days [20 pills] and Flagyl 500 TID [30 pills] x10 days [no refills].  Also asked the patient to call his stomach Dr./Dr. Vira Agar for reevaluation for his colitis that showed up on the CT scan.  Please prescribe the anti-biotics. Thanks. GB

## 2017-07-16 NOTE — Telephone Encounter (Signed)
Antibiotics escribed per md order. Patient aware of results. Referral entered for patient to see Dr. Vira Agar.  [place], please schedule ref with Dr. Vira Agar or one of his NPs to evaluate pt's colitis

## 2017-07-21 ENCOUNTER — Other Ambulatory Visit: Payer: Self-pay | Admitting: *Deleted

## 2017-07-21 DIAGNOSIS — C9192 Lymphoid leukemia, unspecified, in relapse: Secondary | ICD-10-CM

## 2017-07-21 DIAGNOSIS — C8300 Small cell B-cell lymphoma, unspecified site: Secondary | ICD-10-CM

## 2017-07-21 DIAGNOSIS — C9112 Chronic lymphocytic leukemia of B-cell type in relapse: Secondary | ICD-10-CM

## 2017-07-21 MED ORDER — OXYCODONE-ACETAMINOPHEN 5-325 MG PO TABS: ORAL_TABLET | ORAL | 0 refills | Status: DC

## 2017-07-21 NOTE — Addendum Note (Signed)
Addended by: Jarrett Soho C on: 07/21/2017 11:40 AM   Modules accepted: Orders

## 2017-07-22 ENCOUNTER — Telehealth: Payer: Self-pay | Admitting: Internal Medicine

## 2017-07-22 MED FILL — IMBRUVICA 280 MG TAB: 280 | 28 days supply | Qty: 28 | Fill #1

## 2017-07-22 NOTE — Telephone Encounter (Signed)
Oral Oncology Patient Advocate Encounter  Spoke with patient about the grant thru [employer]. Tried to get patients income information to add to spread sheet. He was not sure, but is going to give me a call back.     [facility] Patient Advocate [phone] 07/22/2017 3:07 PM

## 2017-07-22 NOTE — Telephone Encounter (Signed)
Oral Oncology Patient Advocate Encounter  Was successful in securing patient a second grant for $ 7900.00 grant from [employer] to provide copayment coverage for his Imbruvica.  This will keep the out of pocket expense at $0.    I have spoken with the patient.    The billing information is as follows and has been shared with [place].   Member ID: [id number] Group ID: [id number] RxBin: [id number] Dates of Eligibility: 07/22/2017 through 2020

## 2017-07-30 ENCOUNTER — Other Ambulatory Visit: Payer: Self-pay | Admitting: *Deleted

## 2017-07-30 NOTE — Telephone Encounter (Signed)
States he was returning a call and then said he got message re appointment

## 2017-08-04 ENCOUNTER — Encounter: Payer: Self-pay | Admitting: Internal Medicine

## 2017-08-04 ENCOUNTER — Inpatient Hospital Stay: Payer: Medicare Other | Attending: Internal Medicine

## 2017-08-04 ENCOUNTER — Inpatient Hospital Stay (HOSPITAL_BASED_OUTPATIENT_CLINIC_OR_DEPARTMENT_OTHER): Payer: Medicare Other | Admitting: Internal Medicine

## 2017-08-04 VITALS — BP 150/83 | HR 52 | Temp 98.3°F | Resp 16 | Wt 189.4 lb

## 2017-08-04 DIAGNOSIS — D631 Anemia in chronic kidney disease: Secondary | ICD-10-CM

## 2017-08-04 DIAGNOSIS — I129 Hypertensive chronic kidney disease with stage 1 through stage 4 chronic kidney disease, or unspecified chronic kidney disease: Secondary | ICD-10-CM | POA: Diagnosis not present

## 2017-08-04 DIAGNOSIS — E1122 Type 2 diabetes mellitus with diabetic chronic kidney disease: Secondary | ICD-10-CM | POA: Insufficient documentation

## 2017-08-04 DIAGNOSIS — N189 Chronic kidney disease, unspecified: Secondary | ICD-10-CM | POA: Diagnosis not present

## 2017-08-04 DIAGNOSIS — C9192 Lymphoid leukemia, unspecified, in relapse: Secondary | ICD-10-CM

## 2017-08-04 DIAGNOSIS — R1013 Epigastric pain: Secondary | ICD-10-CM | POA: Diagnosis not present

## 2017-08-04 DIAGNOSIS — C9112 Chronic lymphocytic leukemia of B-cell type in relapse: Secondary | ICD-10-CM | POA: Diagnosis not present

## 2017-08-04 LAB — COMPREHENSIVE METABOLIC PANEL
ALK PHOS: 72 U/L (ref 38–126)
ALT: 8 U/L — AB (ref 17–63)
AST: 14 U/L — AB (ref 15–41)
Albumin: 4.2 g/dL (ref 3.5–5.0)
Anion gap: 9 (ref 5–15)
BUN: 19 mg/dL (ref 6–20)
CALCIUM: 8.6 mg/dL — AB (ref 8.9–10.3)
CHLORIDE: 104 mmol/L (ref 101–111)
CO2: 22 mmol/L (ref 22–32)
CREATININE: 1.29 mg/dL — AB (ref 0.61–1.24)
GFR calc Af Amer: >60 mL/min (ref >60)
GFR, EST NON AFRICAN AMERICAN: 55 mL/min — AB (ref >60)
Glucose, Bld: 123 mg/dL — ABNORMAL HIGH (ref 65–99)
Potassium: 5 mmol/L (ref 3.5–5.1)
Sodium: 135 mmol/L (ref 135–145)
Total Bilirubin: 0.6 mg/dL (ref 0.3–1.2)
Total Protein: 6.8 g/dL (ref 6.5–8.1)

## 2017-08-04 LAB — CBC WITH DIFFERENTIAL/PLATELET
Basophils Absolute: 0.2 10*3/uL — ABNORMAL HIGH (ref 0–0.1)
Basophils Relative: 2 %
EOS PCT: 3 %
Eosinophils Absolute: 0.3 10*3/uL (ref 0–0.7)
HCT: 30.5 % — ABNORMAL LOW (ref 40.0–52.0)
Hemoglobin: 10 g/dL — ABNORMAL LOW (ref 13.0–18.0)
LYMPHS ABS: 4.7 10*3/uL — AB (ref 1.0–3.6)
Lymphocytes Relative: 56 %
MCH: 32.4 pg (ref 26.0–34.0)
MCHC: 32.6 g/dL (ref 32.0–36.0)
MCV: 99.3 fL (ref 80.0–100.0)
MONO ABS: 0.4 10*3/uL (ref 0.2–1.0)
Monocytes Relative: 5 %
Neutro Abs: 2.9 10*3/uL (ref 1.4–6.5)
Neutrophils Relative %: 34 %
PLATELETS: 182 10*3/uL (ref 150–440)
RBC: 3.07 MIL/uL — AB (ref 4.40–5.90)
RDW: 18.5 % — AB (ref 11.5–14.5)
WBC: 8.5 10*3/uL (ref 3.8–10.6)

## 2017-08-04 LAB — LACTATE DEHYDROGENASE: LDH: 152 U/L (ref 98–192)

## 2017-08-04 NOTE — Progress Notes (Signed)
[place] OFFICE PROGRESS NOTE  Patient Care Team: [name], MD as PCP - General (Internal Medicine)   SUMMARY OF ONCOLOGIC HISTORY: Oncology History   # 2006- CLL STAGE IV; MAY 2011- WBC- 57K;Platelets-99;Hb-12/CT Bulky LN; BMBx- 80% Invol; del 11; START Benda-Ritux x4 [finished Sep 2011];   # July 2015-Progression; Sep 2015-START ibrutinib; CT scan DEC 2015- Improvement LN; Cont Ibrutinib '140mg'$ /d; NOV 2016 CT- 1-2CM LN [mild progression compared to Dec 2015];NOV 2016- FISH peripheral blood- NO MUTATIONS/CD-38 Positive; NOV 7th- CONT IBRUTINIB 2 pills/day; CT AUG 2017- STABLE;  DEC 6th PET- Mild RP LN/ Retrocrural LN   SA skin infection [Oct 2016] s/p clinda     CLL (chronic lymphoid leukemia) in relapse [facility])     INTERVAL HISTORY:  67 year old male patient with history of relapsed CLL currently on second line therapy ibrutinib since September 2015 for follow-up; patient is currently on '280mg'$  pills of ibrutinib [secondary to intolerance]- is here for follow-up.    Approximately-2 weeks ago patient's ibrutinib was held because of diarrhea/colitis noted on CT scan.  Patient was treated with ciprofloxacin and Flagyl-C. difficile was negative.  Patient is currently finished with antibiotics; diarrhea is improved.    Patient however continues to have intermittent abdominal pain; which is chronic at this time.  Oxycodone seems to be helping.  He has not kept his appointment is recommended with GI.  Recently has not been treated with any antibiotics.  No weight loss. He denies any night sweats. No recent infections or fevers. No nausea no vomiting. Denies any easy bruising or bleeding.    REVIEW OF SYSTEMS:  A complete 10 point review of system is done which is negative except mentioned above/history of present illness.   PAST MEDICAL HISTORY :  Past Medical History:  Diagnosis Date  . CLL (chronic lymphocytic leukemia) ([place])   . Diabetes mellitus without  complication ([place])   . Hematuria   . Hypertension     PAST SURGICAL HISTORY :   Past Surgical History:  Procedure Laterality Date  . CATARACT EXTRACTION W/ INTRAOCULAR LENS IMPLANT Bilateral   . CHOLECYSTECTOMY  1983  . ESOPHAGOGASTRODUODENOSCOPY (EGD) WITH PROPOFOL N/A 11/20/2016   Procedure: ESOPHAGOGASTRODUODENOSCOPY (EGD) WITH PROPOFOL;  Surgeon: Manya Silvas, MD;  Location: [facility] ENDOSCOPY;  Service: Endoscopy;  Laterality: N/A;  . LOWER EXTREMITY ANGIOGRAPHY Right 05/19/2017   Procedure: LOWER EXTREMITY ANGIOGRAPHY;  Surgeon: Algernon Huxley, MD;  Location: [place] CV LAB;  Service: Cardiovascular;  Laterality: Right;    FAMILY HISTORY :   Family History  Problem Relation Age of Onset  . Hypertension Sister     SOCIAL HISTORY:   Social History   Tobacco Use  . Smoking status: Current Every Day Smoker    Packs/day: 1.00    Years: 47.00    Pack years: 47.00    Types: Cigarettes  . Smokeless tobacco: Never Used  Substance Use Topics  . Alcohol use: Yes    Alcohol/week: 3.0 oz    Types: 5 Glasses of wine per week    Comment: 5 a week but almost none in last 6 months  . Drug use: No    ALLERGIES:  has No Known Allergies.  MEDICATIONS:  Current Outpatient Medications  Medication Sig Dispense Refill  . amLODipine (NORVASC) 10 MG tablet Take 1 tablet (10 mg total) by mouth daily. 30 tablet 3  . aspirin EC 81 MG tablet Take 81 mg by mouth daily.    Marland Kitchen atorvastatin (LIPITOR) 10  MG tablet Take 1 tablet (10 mg total) by mouth daily. 30 tablet 11  . clopidogrel (PLAVIX) 75 MG tablet Take 1 tablet (75 mg total) by mouth daily. 30 tablet 11  . Cyanocobalamin (B-12 COMPLIANCE INJECTION) 1000 MCG/ML KIT Inject 1 mL as directed every 4 (four) months.    . IMBRUVICA 280 MG TABS TAKE 1 CAPSULE BY MOUTH DAILY. 28 tablet 6  . iron polysaccharides (NU-IRON) 150 MG capsule Take by mouth.    Marland Kitchen lisinopril-hydrochlorothiazide (PRINZIDE,ZESTORETIC) 20-25 MG tablet Take 1 tablet by  mouth daily. 30 tablet 4  . metFORMIN (GLUMETZA) 1000 MG (MOD) 24 hr tablet Take 1,000 mg by mouth 2 (two) times daily.    . metoprolol tartrate (LOPRESSOR) 25 MG tablet Take 1 tablet (25 mg total) by mouth 2 (two) times daily. 60 tablet 4  . omeprazole (PRILOSEC) 40 MG capsule Take by mouth.    . oxyCODONE-acetaminophen (PERCOCET/ROXICET) 5-325 MG tablet 1 pill every 8-12 hours 60 tablet 0  . Sennosides (EX-LAX PO) Take 2 capsules by mouth as needed (constipation).    . sucralfate (CARAFATE) 1 g tablet Take by mouth.     No current facility-administered medications for this visit.     PHYSICAL EXAMINATION: ECOG PERFORMANCE STATUS: 0 - Asymptomatic  BP (!) 150/83 (BP Location: Left Arm, Patient Position: Sitting)   Pulse (!) 52   Temp 98.3 F (36.8 C) (Tympanic)   Resp 16   Wt 189 lb 6.4 oz (85.9 kg)   BMI 28.80 kg/m   Filed Weights   08/04/17 1123  Weight: 189 lb 6.4 oz (85.9 kg)    GENERAL: Well-nourished well-developed; Alert, no distress and comfortable.   Alone. EYES: no pallor or icterus OROPHARYNX: no thrush or ulceration; good dentition  NECK: supple, no masses felt LYMPH:  no palpable lymphadenopathy in the cervical, axillary or inguinal regions LUNGS: clear to auscultation and  No wheeze or crackles HEART/CVS: regular rate & rhythm and no murmurs; No lower extremity edema ABDOMEN:abdomen soft, non-tender and normal bowel sounds.  Musculoskeletal:no cyanosis of digits and no clubbing  PSYCH: alert & oriented x 3 with fluent speech NEURO: no focal motor/sensory deficits SKIN:   No rash.  LABORATORY DATA:  I have reviewed the data as listed    Component Value Date/Time   NA 135 08/04/2017 1015   NA 142 05/03/2014 1139   K 5.0 08/04/2017 1015   K 4.7 05/03/2014 1139   CL 104 08/04/2017 1015   CL 110 (H) 05/03/2014 1139   CO2 22 08/04/2017 1015   CO2 24 05/03/2014 1139   GLUCOSE 123 (H) 08/04/2017 1015   GLUCOSE 98 05/03/2014 1139   BUN 19 08/04/2017 1015    BUN 20 (H) 05/03/2014 1139   CREATININE 1.29 (H) 08/04/2017 1015   CREATININE 1.22 08/09/2014 1122   CALCIUM 8.6 (L) 08/04/2017 1015   CALCIUM 8.6 05/03/2014 1139   PROT 6.8 08/04/2017 1015   PROT 7.5 05/03/2014 1139   ALBUMIN 4.2 08/04/2017 1015   ALBUMIN 4.1 05/03/2014 1139   AST 14 (L) 08/04/2017 1015   AST 15 05/03/2014 1139   ALT 8 (L) 08/04/2017 1015   ALT 26 05/03/2014 1139   ALKPHOS 72 08/04/2017 1015   ALKPHOS 94 05/03/2014 1139   BILITOT 0.6 08/04/2017 1015   BILITOT 0.5 05/03/2014 1139   GFRNONAA 55 (L) 08/04/2017 1015   GFRNONAA >60 08/09/2014 1122   GFRAA >60 08/04/2017 1015   GFRAA >60 08/09/2014 1122    No results found  for: SPEP, UPEP  Lab Results  Component Value Date   WBC 8.5 08/04/2017   NEUTROABS 2.9 08/04/2017   HGB 10.0 (L) 08/04/2017   HCT 30.5 (L) 08/04/2017   MCV 99.3 08/04/2017   PLT 182 08/04/2017      Chemistry      Component Value Date/Time   NA 135 08/04/2017 1015   NA 142 05/03/2014 1139   K 5.0 08/04/2017 1015   K 4.7 05/03/2014 1139   CL 104 08/04/2017 1015   CL 110 (H) 05/03/2014 1139   CO2 22 08/04/2017 1015   CO2 24 05/03/2014 1139   BUN 19 08/04/2017 1015   BUN 20 (H) 05/03/2014 1139   CREATININE 1.29 (H) 08/04/2017 1015   CREATININE 1.22 08/09/2014 1122      Component Value Date/Time   CALCIUM 8.6 (L) 08/04/2017 1015   CALCIUM 8.6 05/03/2014 1139   ALKPHOS 72 08/04/2017 1015   ALKPHOS 94 05/03/2014 1139   AST 14 (L) 08/04/2017 1015   AST 15 05/03/2014 1139   ALT 8 (L) 08/04/2017 1015   ALT 26 05/03/2014 1139   BILITOT 0.6 08/04/2017 1015   BILITOT 0.5 05/03/2014 1139     IMPRESSION: 1. No evidence of residual metabolically active lymphoma in the neck, chest, abdomen or pelvis. 2. No acute findings. 3. Stable incidental findings including Aortic Atherosclerosis (ICD10-I70.0). 4. Stable small left upper lobe nodules from recent chest CT.   Electronically Signed   By: Richardean Sale M.D.   On: 01/07/2017  16:13    ASSESSMENT & PLAN:  CLL (chronic lymphoid leukemia) in relapse ([place]) # CLL/SLL- relapsed currently on second line therapy with ibrutinib September 2016.  March 2019-CT scan shows no progressive/worsening lymphadenopathy; however shows "colitis"-see discussion below  # RE-START Ibrutinib 280 milligrams a day.  I do not think this is contributing to patient's abdominal pain [see discussion below]  # Abdominal pain/dyspepsia- ?  Second metformin.  Recommend talk to [name] re: switching to different anti-diabetic medication.Marland Kitchen   #Elevated blood pressure continue home medications follow-up with PCP  # Anemia/CKD- II/ SLL-] hemoglobin 9.9/platelets 120s question secondary to ibrutinib. No evidence of iron def.   # follow up in 4 weeks/ refer back to GI/[name]; will speak to [name]/ recommend call PCP office-regarding metformin  CC: [name]/[name].     Cammie Sickle, MD 08/05/2017 6:18 PM

## 2017-08-04 NOTE — Assessment & Plan Note (Addendum)
#   CLL/SLL- relapsed currently on second line therapy with ibrutinib September 2016.  March 2019-CT scan shows no progressive/worsening lymphadenopathy; however shows "colitis"-see discussion below  # RE-START Ibrutinib 280 milligrams a day.  I do not think this is contributing to patient's abdominal pain [see discussion below]  # Abdominal pain/dyspepsia- ?  Second metformin.  Recommend talk to [name] re: switching to different anti-diabetic medication.Marland Kitchen   #Elevated blood pressure continue home medications follow-up with PCP  # Anemia/CKD- II/ SLL-] hemoglobin 9.9/platelets 120s question secondary to ibrutinib. No evidence of iron def.   # follow up in 4 weeks/ refer back to GI/[name]; will speak to [name]/ recommend call PCP office-regarding metformin  CC: [name]/[name].

## 2017-08-11 ENCOUNTER — Other Ambulatory Visit: Payer: Self-pay | Admitting: *Deleted

## 2017-08-11 ENCOUNTER — Other Ambulatory Visit: Payer: Self-pay

## 2017-08-11 DIAGNOSIS — C9192 Lymphoid leukemia, unspecified, in relapse: Secondary | ICD-10-CM

## 2017-08-11 DIAGNOSIS — C9112 Chronic lymphocytic leukemia of B-cell type in relapse: Secondary | ICD-10-CM

## 2017-08-11 DIAGNOSIS — C8300 Small cell B-cell lymphoma, unspecified site: Secondary | ICD-10-CM

## 2017-08-11 MED ORDER — OXYCODONE-ACETAMINOPHEN 5-325 MG PO TABS: ORAL_TABLET | ORAL | 0 refills | Status: DC

## 2017-08-15 MED FILL — IMBRUVICA 280 MG TAB: 280 | 28 days supply | Qty: 28 | Fill #2

## 2017-08-21 DIAGNOSIS — G8929 Other chronic pain: Secondary | ICD-10-CM | POA: Insufficient documentation

## 2017-08-21 DIAGNOSIS — Z86010 Personal history of colonic polyps: Secondary | ICD-10-CM | POA: Insufficient documentation

## 2017-08-22 DIAGNOSIS — K219 Gastro-esophageal reflux disease without esophagitis: Secondary | ICD-10-CM | POA: Insufficient documentation

## 2017-09-01 ENCOUNTER — Other Ambulatory Visit: Payer: Self-pay | Admitting: *Deleted

## 2017-09-01 DIAGNOSIS — C9112 Chronic lymphocytic leukemia of B-cell type in relapse: Secondary | ICD-10-CM

## 2017-09-01 DIAGNOSIS — C8300 Small cell B-cell lymphoma, unspecified site: Secondary | ICD-10-CM

## 2017-09-01 DIAGNOSIS — C9192 Lymphoid leukemia, unspecified, in relapse: Secondary | ICD-10-CM

## 2017-09-01 MED ORDER — OXYCODONE-ACETAMINOPHEN 5-325 MG PO TABS: ORAL_TABLET | ORAL | 0 refills | Status: DC

## 2017-09-03 ENCOUNTER — Inpatient Hospital Stay: Payer: Medicare Other | Attending: Internal Medicine

## 2017-09-03 ENCOUNTER — Inpatient Hospital Stay (HOSPITAL_BASED_OUTPATIENT_CLINIC_OR_DEPARTMENT_OTHER): Payer: Medicare Other | Admitting: Internal Medicine

## 2017-09-03 ENCOUNTER — Other Ambulatory Visit: Payer: Self-pay

## 2017-09-03 ENCOUNTER — Encounter: Payer: Self-pay | Admitting: Internal Medicine

## 2017-09-03 VITALS — BP 162/82 | HR 47 | Temp 97.9°F | Resp 20

## 2017-09-03 DIAGNOSIS — E119 Type 2 diabetes mellitus without complications: Secondary | ICD-10-CM

## 2017-09-03 DIAGNOSIS — R5381 Other malaise: Secondary | ICD-10-CM

## 2017-09-03 DIAGNOSIS — R1013 Epigastric pain: Secondary | ICD-10-CM

## 2017-09-03 DIAGNOSIS — C9112 Chronic lymphocytic leukemia of B-cell type in relapse: Secondary | ICD-10-CM

## 2017-09-03 DIAGNOSIS — R5383 Other fatigue: Secondary | ICD-10-CM

## 2017-09-03 DIAGNOSIS — R109 Unspecified abdominal pain: Secondary | ICD-10-CM

## 2017-09-03 DIAGNOSIS — R110 Nausea: Secondary | ICD-10-CM | POA: Insufficient documentation

## 2017-09-03 DIAGNOSIS — F17210 Nicotine dependence, cigarettes, uncomplicated: Secondary | ICD-10-CM | POA: Insufficient documentation

## 2017-09-03 DIAGNOSIS — I10 Essential (primary) hypertension: Secondary | ICD-10-CM | POA: Insufficient documentation

## 2017-09-03 DIAGNOSIS — D649 Anemia, unspecified: Secondary | ICD-10-CM | POA: Insufficient documentation

## 2017-09-03 DIAGNOSIS — C9192 Lymphoid leukemia, unspecified, in relapse: Secondary | ICD-10-CM

## 2017-09-03 DIAGNOSIS — T50995A Adverse effect of other drugs, medicaments and biological substances, initial encounter: Secondary | ICD-10-CM | POA: Diagnosis not present

## 2017-09-03 LAB — COMPREHENSIVE METABOLIC PANEL
ALK PHOS: 82 U/L (ref 38–126)
ALT: 7 U/L — AB (ref 17–63)
AST: 14 U/L — AB (ref 15–41)
Albumin: 4 g/dL (ref 3.5–5.0)
Anion gap: 9 (ref 5–15)
BILIRUBIN TOTAL: 0.6 mg/dL (ref 0.3–1.2)
BUN: 18 mg/dL (ref 6–20)
CALCIUM: 8.7 mg/dL — AB (ref 8.9–10.3)
CO2: 23 mmol/L (ref 22–32)
CREATININE: 1.19 mg/dL (ref 0.61–1.24)
Chloride: 105 mmol/L (ref 101–111)
GFR calc Af Amer: >60 mL/min (ref >60)
Glucose, Bld: 111 mg/dL — ABNORMAL HIGH (ref 65–99)
Potassium: 4.2 mmol/L (ref 3.5–5.1)
Sodium: 137 mmol/L (ref 135–145)
TOTAL PROTEIN: 6.8 g/dL (ref 6.5–8.1)

## 2017-09-03 LAB — CBC WITH DIFFERENTIAL/PLATELET
BASOS PCT: 1 %
Basophils Absolute: 0.1 10*3/uL (ref 0–0.1)
EOS ABS: 0.1 10*3/uL (ref 0–0.7)
EOS PCT: 1 %
HCT: 28 % — ABNORMAL LOW (ref 40.0–52.0)
Hemoglobin: 9.2 g/dL — ABNORMAL LOW (ref 13.0–18.0)
Lymphocytes Relative: 55 %
Lymphs Abs: 5 10*3/uL — ABNORMAL HIGH (ref 1.0–3.6)
MCH: 32.8 pg (ref 26.0–34.0)
MCHC: 33 g/dL (ref 32.0–36.0)
MCV: 99.5 fL (ref 80.0–100.0)
Monocytes Absolute: 0.7 10*3/uL (ref 0.2–1.0)
Monocytes Relative: 8 %
Neutro Abs: 3.1 10*3/uL (ref 1.4–6.5)
Neutrophils Relative %: 35 %
PLATELETS: 92 10*3/uL — AB (ref 150–440)
RBC: 2.81 MIL/uL — AB (ref 4.40–5.90)
RDW: 18.1 % — ABNORMAL HIGH (ref 11.5–14.5)
WBC: 9 10*3/uL (ref 3.8–10.6)

## 2017-09-03 NOTE — Progress Notes (Signed)
James [name] OFFICE PROGRESS NOTE  Patient Care Team: [name], MD as PCP - General (Internal Medicine)  Cancer Staging No matching staging information was found for the patient.   Oncology History   # 2006- CLL STAGE IV; MAY 2011- WBC- 57K;Platelets-99;Hb-12/CT Bulky LN; BMBx- 80% Invol; del 11; START Benda-Ritux x4 [finished Sep 2011];   # July 2015-Progression; Sep 2015-START ibrutinib; CT scan DEC 2015- Improvement LN; Cont Ibrutinib 140mg /d; NOV 2016 CT- 1-2CM LN [mild progression compared to Dec 2015];NOV 2016- FISH peripheral blood- NO MUTATIONS/CD-38 Positive; NOV 7th- CONT IBRUTINIB 2 pills/day; CT AUG 2017- STABLE;  DEC 6th PET- Mild RP LN/ Retrocrural LN   SA skin infection [Oct 2016] s/p clinda     CLL (chronic lymphoid leukemia) in relapse (James [name])      INTERVAL HISTORY:  James [name] 67 y.o.  male pleasant patient above history of CLL/SLL currently on ibrutinib is here for follow-up.  patient continues to have chronic abdominal pain; chronic mild nausea.  No no vomiting.  He continues to take Percocet.   He has been evaluated by GI awaiting upper and lower endoscopy.  Review of Systems  Constitutional: Positive for malaise/fatigue. Negative for chills, diaphoresis, fever and weight loss.  HENT: Negative for nosebleeds and sore throat.   Eyes: Negative for double vision.  Respiratory: Negative for cough, hemoptysis, sputum production, shortness of breath and wheezing.   Cardiovascular: Negative for chest pain, palpitations, orthopnea and leg swelling.  Gastrointestinal: Positive for abdominal pain and nausea. Negative for blood in stool, constipation, diarrhea, heartburn, melena and vomiting.  Genitourinary: Negative for dysuria, frequency and urgency.  Musculoskeletal: Negative for back pain and joint pain.  Skin: Negative.  Negative for itching and rash.  Neurological: Negative for dizziness, tingling, focal weakness, weakness and headaches.   Endo/Heme/Allergies: Does not bruise/bleed easily.  Psychiatric/Behavioral: Negative for depression. The patient is not nervous/anxious and does not have insomnia.       PAST MEDICAL HISTORY :  Past Medical History:  Diagnosis Date  . CLL (chronic lymphocytic leukemia) ([place])   . Diabetes mellitus without complication ([place])   . Hematuria   . Hypertension     PAST SURGICAL HISTORY :   Past Surgical History:  Procedure Laterality Date  . CATARACT EXTRACTION W/ INTRAOCULAR LENS IMPLANT Bilateral   . CHOLECYSTECTOMY  1983  . ESOPHAGOGASTRODUODENOSCOPY (EGD) WITH PROPOFOL N/A 11/20/2016   Procedure: ESOPHAGOGASTRODUODENOSCOPY (EGD) WITH PROPOFOL;  Surgeon: Manya Silvas, MD;  Location: [facility] ENDOSCOPY;  Service: Endoscopy;  Laterality: N/A;  . LOWER EXTREMITY ANGIOGRAPHY Right 05/19/2017   Procedure: LOWER EXTREMITY ANGIOGRAPHY;  Surgeon: Algernon Huxley, MD;  Location: [place] CV LAB;  Service: Cardiovascular;  Laterality: Right;    FAMILY HISTORY :   Family History  Problem Relation Age of Onset  . Hypertension Sister     SOCIAL HISTORY:   Social History   Tobacco Use  . Smoking status: Current Every Day Smoker    Packs/day: 1.00    Years: 47.00    Pack years: 47.00    Types: Cigarettes  . Smokeless tobacco: Never Used  Substance Use Topics  . Alcohol use: Yes    Alcohol/week: 3.0 oz    Types: 5 Glasses of wine per week    Comment: 5 a week but almost none in last 6 months  . Drug use: No    ALLERGIES:  has No Known Allergies.  MEDICATIONS:  Current Outpatient Medications  Medication Sig Dispense Refill  . amLODipine (  NORVASC) 10 MG tablet Take 1 tablet (10 mg total) by mouth daily. 30 tablet 3  . aspirin EC 81 MG tablet Take 81 mg by mouth daily.    Marland Kitchen atorvastatin (LIPITOR) 10 MG tablet Take 1 tablet (10 mg total) by mouth daily. 30 tablet 11  . clopidogrel (PLAVIX) 75 MG tablet Take 1 tablet (75 mg total) by mouth daily. 30 tablet 11  . IMBRUVICA 280 MG  TABS TAKE 1 CAPSULE BY MOUTH DAILY. 28 tablet 6  . iron polysaccharides (NU-IRON) 150 MG capsule Take 150 mg by mouth daily.     . metFORMIN (GLUMETZA) 1000 MG (MOD) 24 hr tablet Take 1,000 mg by mouth 2 (two) times daily.    . metoprolol tartrate (LOPRESSOR) 25 MG tablet Take 1 tablet (25 mg total) by mouth 2 (two) times daily. 60 tablet 4  . omeprazole (PRILOSEC) 40 MG capsule Take 40 mg by mouth daily.     Marland Kitchen oxyCODONE-acetaminophen (PERCOCET/ROXICET) 5-325 MG tablet 1 pill every 8-12 hours 60 tablet 0  . Sennosides (EX-LAX PO) Take 2 capsules by mouth as needed (constipation).     No current facility-administered medications for this visit.     PHYSICAL EXAMINATION: ECOG PERFORMANCE STATUS: 1 - Symptomatic but completely ambulatory  BP (!) 162/82   Pulse (!) 47   Temp 97.9 F (36.6 C) (Tympanic)   Resp 20   There were no vitals filed for this visit.  GENERAL: Well-nourished well-developed; Alert, no distress and comfortable.  Alone. EYES: no pallor or icterus OROPHARYNX: no thrush or ulceration; NECK: supple; no lymph nodes felt. LYMPH:  no palpable lymphadenopathy in the axillary or inguinal regions LUNGS: Decreased breath sounds auscultation bilaterally. No wheeze or crackles HEART/CVS: regular rate & rhythm and no murmurs; No lower extremity edema ABDOMEN:abdomen soft, non-tender and normal bowel sounds. No hepatomegaly or splenomegaly.  Musculoskeletal:no cyanosis of digits and no clubbing  PSYCH: alert & oriented x 3 with fluent speech NEURO: no focal motor/sensory deficits SKIN:  no rashes or significant lesions    LABORATORY DATA:  I have reviewed the data as listed    Component Value Date/Time   NA 137 09/03/2017 1022   NA 142 05/03/2014 1139   K 4.2 09/03/2017 1022   K 4.7 05/03/2014 1139   CL 105 09/03/2017 1022   CL 110 (H) 05/03/2014 1139   CO2 23 09/03/2017 1022   CO2 24 05/03/2014 1139   GLUCOSE 111 (H) 09/03/2017 1022   GLUCOSE 98 05/03/2014 1139    BUN 18 09/03/2017 1022   BUN 20 (H) 05/03/2014 1139   CREATININE 1.19 09/03/2017 1022   CREATININE 1.22 08/09/2014 1122   CALCIUM 8.7 (L) 09/03/2017 1022   CALCIUM 8.6 05/03/2014 1139   PROT 6.8 09/03/2017 1022   PROT 7.5 05/03/2014 1139   ALBUMIN 4.0 09/03/2017 1022   ALBUMIN 4.1 05/03/2014 1139   AST 14 (L) 09/03/2017 1022   AST 15 05/03/2014 1139   ALT 7 (L) 09/03/2017 1022   ALT 26 05/03/2014 1139   ALKPHOS 82 09/03/2017 1022   ALKPHOS 94 05/03/2014 1139   BILITOT 0.6 09/03/2017 1022   BILITOT 0.5 05/03/2014 1139   GFRNONAA >60 09/03/2017 1022   GFRNONAA >60 08/09/2014 1122   GFRAA >60 09/03/2017 1022   GFRAA >60 08/09/2014 1122    No results found for: SPEP, UPEP  Lab Results  Component Value Date   WBC 9.0 09/03/2017   NEUTROABS 3.1 09/03/2017   HGB 9.2 (L) 09/03/2017  HCT 28.0 (L) 09/03/2017   MCV 99.5 09/03/2017   PLT 92 (L) 09/03/2017      Chemistry      Component Value Date/Time   NA 137 09/03/2017 1022   NA 142 05/03/2014 1139   K 4.2 09/03/2017 1022   K 4.7 05/03/2014 1139   CL 105 09/03/2017 1022   CL 110 (H) 05/03/2014 1139   CO2 23 09/03/2017 1022   CO2 24 05/03/2014 1139   BUN 18 09/03/2017 1022   BUN 20 (H) 05/03/2014 1139   CREATININE 1.19 09/03/2017 1022   CREATININE 1.22 08/09/2014 1122      Component Value Date/Time   CALCIUM 8.7 (L) 09/03/2017 1022   CALCIUM 8.6 05/03/2014 1139   ALKPHOS 82 09/03/2017 1022   ALKPHOS 94 05/03/2014 1139   AST 14 (L) 09/03/2017 1022   AST 15 05/03/2014 1139   ALT 7 (L) 09/03/2017 1022   ALT 26 05/03/2014 1139   BILITOT 0.6 09/03/2017 1022   BILITOT 0.5 05/03/2014 1139       RADIOGRAPHIC STUDIES: I have personally reviewed the radiological images as listed and agreed with the findings in the report. No results found.   ASSESSMENT & PLAN:  CLL (chronic lymphoid leukemia) in relapse ([place]) # CLL/SLL- relapsed currently on second line therapy with ibrutinib September 2016.  March 2019-CT scan  shows no progressive/worsening lymphadenopathy.  #Continue ibrutinib at 280 mg/day.  No obvious evidence of clinical progression.  #Abdominal pain/dyspepsia/unclear etiology; not improving.  Discussed with GI Ms. Dawson Bills.  Awaiting EGD/colonoscopy.  Continue Percocet.   #Elevated blood pressure-secondary to ibrutinib; recommend monitoring closely/bring a log of his blood pressures at next visit  #Anemia-hemoglobin 9.9/platelet 90 secondary to ibrutinib.  Monitor closely.  # folow up in 1 monthslabs- cbc/bmp.   CC: [name]/[name].    Orders Placed This Encounter  Procedures  . CBC with Differential    Standing Status:   Future    Standing Expiration Date:   09/04/2018  . Basic metabolic panel    Standing Status:   Future    Standing Expiration Date:   09/04/2018   All questions were answered. The patient knows to call the clinic with any problems, questions or concerns.      Cammie Sickle, MD 09/07/2017 7:46 PM

## 2017-09-03 NOTE — Assessment & Plan Note (Addendum)
#   CLL/SLL- relapsed currently on second line therapy with ibrutinib September 2016.  March 2019-CT scan shows no progressive/worsening lymphadenopathy.  #Continue ibrutinib at 280 mg/day.  No obvious evidence of clinical progression.  #Abdominal pain/dyspepsia/unclear etiology; not improving.  Discussed with GI Ms. Dawson Bills.  Awaiting EGD/colonoscopy.  Continue Percocet.   #Elevated blood pressure-secondary to ibrutinib; recommend monitoring closely/bring a log of his blood pressures at next visit  #Anemia-hemoglobin 9.9/platelet 90 secondary to ibrutinib.  Monitor closely.  # folow up in 1 monthslabs- cbc/bmp.   CC: [name]/[name].

## 2017-09-10 MED FILL — IMBRUVICA 280 MG TAB: 280 | 28 days supply | Qty: 28 | Fill #3

## 2017-09-11 ENCOUNTER — Telehealth: Payer: Self-pay | Admitting: Pharmacist

## 2017-09-11 NOTE — Telephone Encounter (Signed)
Oral Chemotherapy Pharmacist Encounter  Follow-Up Form  Called patient today to follow up regarding patient's oral chemotherapy medication: Imbruvica (ibrutinib)  Original Start date of oral chemotherapy: 12/2013  Pt reports 1 tablets/doses of ibrutinib missed in the last month.  Missed dose(s) attributed to: simply forgetting Reviewed plan for missed doses.   Pt reports the following side effects: None reported  Recent labs reviewed: CBC from 09/03/17  New medications?: None reported  Other Issues: None reported  Patient knows to call the office with questions or concerns. Oral Oncology Clinic will continue to follow.  Darl Pikes, PharmD, BCPS Hematology/Oncology Clinical Pharmacist [facility] [phone]  09/11/2017 11:14 AM

## 2017-09-19 ENCOUNTER — Other Ambulatory Visit: Payer: Self-pay | Admitting: *Deleted

## 2017-09-19 DIAGNOSIS — C8300 Small cell B-cell lymphoma, unspecified site: Secondary | ICD-10-CM

## 2017-09-19 DIAGNOSIS — C9112 Chronic lymphocytic leukemia of B-cell type in relapse: Secondary | ICD-10-CM

## 2017-09-19 DIAGNOSIS — C9192 Lymphoid leukemia, unspecified, in relapse: Secondary | ICD-10-CM

## 2017-09-19 MED ORDER — OXYCODONE-ACETAMINOPHEN 5-325 MG PO TABS: ORAL_TABLET | ORAL | 0 refills | Status: DC

## 2017-10-03 ENCOUNTER — Other Ambulatory Visit: Payer: Medicare Other

## 2017-10-03 ENCOUNTER — Ambulatory Visit: Payer: Medicare Other | Admitting: Oncology

## 2017-10-03 MED FILL — IMBRUVICA 280 MG TAB: 280 | 28 days supply | Qty: 28 | Fill #4

## 2017-10-08 ENCOUNTER — Other Ambulatory Visit: Payer: Self-pay

## 2017-10-08 ENCOUNTER — Inpatient Hospital Stay: Payer: Medicare Other | Attending: Oncology

## 2017-10-08 ENCOUNTER — Encounter: Payer: Self-pay | Admitting: Oncology

## 2017-10-08 ENCOUNTER — Inpatient Hospital Stay (HOSPITAL_BASED_OUTPATIENT_CLINIC_OR_DEPARTMENT_OTHER): Payer: Medicare Other | Admitting: Oncology

## 2017-10-08 VITALS — BP 171/76 | HR 57 | Temp 97.6°F | Resp 18 | Wt 185.7 lb

## 2017-10-08 DIAGNOSIS — R110 Nausea: Secondary | ICD-10-CM | POA: Diagnosis not present

## 2017-10-08 DIAGNOSIS — D696 Thrombocytopenia, unspecified: Secondary | ICD-10-CM | POA: Diagnosis not present

## 2017-10-08 DIAGNOSIS — D539 Nutritional anemia, unspecified: Secondary | ICD-10-CM | POA: Diagnosis not present

## 2017-10-08 DIAGNOSIS — C9192 Lymphoid leukemia, unspecified, in relapse: Secondary | ICD-10-CM

## 2017-10-08 DIAGNOSIS — I10 Essential (primary) hypertension: Secondary | ICD-10-CM

## 2017-10-08 DIAGNOSIS — C9112 Chronic lymphocytic leukemia of B-cell type in relapse: Secondary | ICD-10-CM | POA: Insufficient documentation

## 2017-10-08 DIAGNOSIS — F17210 Nicotine dependence, cigarettes, uncomplicated: Secondary | ICD-10-CM

## 2017-10-08 DIAGNOSIS — R5383 Other fatigue: Secondary | ICD-10-CM | POA: Insufficient documentation

## 2017-10-08 DIAGNOSIS — R1084 Generalized abdominal pain: Secondary | ICD-10-CM | POA: Diagnosis not present

## 2017-10-08 DIAGNOSIS — R5381 Other malaise: Secondary | ICD-10-CM | POA: Insufficient documentation

## 2017-10-08 DIAGNOSIS — C8300 Small cell B-cell lymphoma, unspecified site: Secondary | ICD-10-CM | POA: Diagnosis not present

## 2017-10-08 DIAGNOSIS — E119 Type 2 diabetes mellitus without complications: Secondary | ICD-10-CM | POA: Diagnosis not present

## 2017-10-08 LAB — CBC WITH DIFFERENTIAL/PLATELET
BASOS ABS: 0.2 10*3/uL — AB (ref 0–0.1)
Basophils Relative: 2 %
EOS ABS: 0.1 10*3/uL (ref 0–0.7)
EOS PCT: 1 %
HCT: 27.6 % — ABNORMAL LOW (ref 40.0–52.0)
Hemoglobin: 8.9 g/dL — ABNORMAL LOW (ref 13.0–18.0)
LYMPHS PCT: 44 %
Lymphs Abs: 4.6 10*3/uL — ABNORMAL HIGH (ref 1.0–3.6)
MCH: 32.3 pg (ref 26.0–34.0)
MCHC: 32.3 g/dL (ref 32.0–36.0)
MCV: 100.1 fL — ABNORMAL HIGH (ref 80.0–100.0)
Monocytes Absolute: 1.2 10*3/uL — ABNORMAL HIGH (ref 0.2–1.0)
Monocytes Relative: 12 %
Neutro Abs: 4.3 10*3/uL (ref 1.4–6.5)
Neutrophils Relative %: 41 %
PLATELETS: 122 10*3/uL — AB (ref 150–440)
RBC: 2.76 MIL/uL — AB (ref 4.40–5.90)
RDW: 18.9 % — AB (ref 11.5–14.5)
WBC: 10.5 10*3/uL (ref 3.8–10.6)

## 2017-10-08 LAB — BASIC METABOLIC PANEL
ANION GAP: 7 (ref 5–15)
BUN: 19 mg/dL (ref 6–20)
CALCIUM: 9.1 mg/dL (ref 8.9–10.3)
CO2: 22 mmol/L (ref 22–32)
Chloride: 110 mmol/L (ref 101–111)
Creatinine, Ser: 1.28 mg/dL — ABNORMAL HIGH (ref 0.61–1.24)
GFR calc Af Amer: >60 mL/min (ref >60)
GFR, EST NON AFRICAN AMERICAN: 56 mL/min — AB (ref >60)
Glucose, Bld: 154 mg/dL — ABNORMAL HIGH (ref 65–99)
POTASSIUM: 4.7 mmol/L (ref 3.5–5.1)
SODIUM: 139 mmol/L (ref 135–145)

## 2017-10-08 MED ORDER — OXYCODONE-ACETAMINOPHEN 5-325 MG PO TABS: ORAL_TABLET | ORAL | 0 refills | Status: DC

## 2017-10-08 NOTE — Progress Notes (Signed)
Patient here today for follow up. No concerns voiced.  °

## 2017-10-08 NOTE — Progress Notes (Signed)
[place] OFFICE PROGRESS NOTE  Patient Care Team: [name], MD as PCP - General (Internal Medicine)  Cancer Staging No matching staging information was found for the patient.   Oncology History   # 2006- CLL STAGE IV; MAY 2011- WBC- 57K;Platelets-99;Hb-12/CT Bulky LN; BMBx- 80% Invol; del 11; START Benda-Ritux x4 [finished Sep 2011];   # July 2015-Progression; Sep 2015-START ibrutinib; CT scan DEC 2015- Improvement LN; Cont Ibrutinib 140mg /d; NOV 2016 CT- 1-2CM LN [mild progression compared to Dec 2015];NOV 2016- FISH peripheral blood- NO MUTATIONS/CD-38 Positive; NOV 7th- CONT IBRUTINIB 2 pills/day; CT AUG 2017- STABLE;  DEC 6th PET- Mild RP LN/ Retrocrural LN   SA skin infection [Oct 2016] s/p clinda     CLL (chronic lymphoid leukemia) in relapse [facility])      INTERVAL HISTORY:  James [name] 67 y.o.  male pleasant patient above history of CLL/SLL who is currently on ibrutinib presents for follow-up.  I am covering doctor Rogue Bussing to see this patient.  #CLL, denies any weight loss, fever or chills.  Denies night sweats.  Continues on ibrutinib without any complaints of side effects. #Chronic abdominal pain with mild nausea.  No vomiting.  He continues to take Percocet and ask for refill.  He will have GI evaluation including upper and lower endoscopy in July. #Fatigue, chronic stable.   Review of Systems  Constitutional: Positive for malaise/fatigue. Negative for chills, diaphoresis, fever and weight loss.  HENT: Negative for congestion, ear discharge, ear pain, nosebleeds, sinus pain and sore throat.   Eyes: Negative for double vision, photophobia, pain, discharge and redness.  Respiratory: Negative for cough, hemoptysis, sputum production, shortness of breath and wheezing.   Cardiovascular: Negative for chest pain, palpitations, orthopnea, claudication and leg swelling.  Gastrointestinal: Positive for abdominal pain and nausea. Negative for blood in  stool, constipation, diarrhea, heartburn, melena and vomiting.  Genitourinary: Negative for dysuria, flank pain, frequency, hematuria and urgency.  Musculoskeletal: Negative for back pain, joint pain, myalgias and neck pain.  Skin: Negative.  Negative for itching and rash.  Neurological: Negative for dizziness, tingling, tremors, focal weakness, weakness and headaches.  Endo/Heme/Allergies: Negative for environmental allergies. Does not bruise/bleed easily.  Psychiatric/Behavioral: Negative for depression and hallucinations. The patient is not nervous/anxious and does not have insomnia.       PAST MEDICAL HISTORY :  Past Medical History:  Diagnosis Date  . CLL (chronic lymphocytic leukemia) ([place])   . Diabetes mellitus without complication ([place])   . Hematuria   . Hypertension     PAST SURGICAL HISTORY :   Past Surgical History:  Procedure Laterality Date  . CATARACT EXTRACTION W/ INTRAOCULAR LENS IMPLANT Bilateral   . CHOLECYSTECTOMY  1983  . ESOPHAGOGASTRODUODENOSCOPY (EGD) WITH PROPOFOL N/A 11/20/2016   Procedure: ESOPHAGOGASTRODUODENOSCOPY (EGD) WITH PROPOFOL;  Surgeon: Manya Silvas, MD;  Location: [facility] ENDOSCOPY;  Service: Endoscopy;  Laterality: N/A;  . LOWER EXTREMITY ANGIOGRAPHY Right 05/19/2017   Procedure: LOWER EXTREMITY ANGIOGRAPHY;  Surgeon: Algernon Huxley, MD;  Location: [place] CV LAB;  Service: Cardiovascular;  Laterality: Right;    FAMILY HISTORY :   Family History  Problem Relation Age of Onset  . Hypertension Sister     SOCIAL HISTORY:   Social History   Tobacco Use  . Smoking status: Current Every Day Smoker    Packs/day: 1.00    Years: 47.00    Pack years: 47.00    Types: Cigarettes  . Smokeless tobacco: Never Used  Substance Use Topics  .  Alcohol use: Yes    Alcohol/week: 3.0 oz    Types: 5 Glasses of wine per week    Comment: 5 a week but almost none in last 6 months  . Drug use: No    ALLERGIES:  has No Known  Allergies.  MEDICATIONS:  Current Outpatient Medications  Medication Sig Dispense Refill  . amLODipine (NORVASC) 10 MG tablet Take 1 tablet (10 mg total) by mouth daily. 30 tablet 3  . aspirin EC 81 MG tablet Take 81 mg by mouth daily.    Marland Kitchen atorvastatin (LIPITOR) 10 MG tablet Take 1 tablet (10 mg total) by mouth daily. 30 tablet 11  . clopidogrel (PLAVIX) 75 MG tablet Take 1 tablet (75 mg total) by mouth daily. 30 tablet 11  . IMBRUVICA 280 MG TABS TAKE 1 CAPSULE BY MOUTH DAILY. 28 tablet 6  . lisinopril (PRINIVIL,ZESTRIL) 30 MG tablet Take 30 mg by mouth daily.  1  . metFORMIN (GLUMETZA) 1000 MG (MOD) 24 hr tablet Take 1,000 mg by mouth 2 (two) times daily.    . metoprolol tartrate (LOPRESSOR) 25 MG tablet Take 1 tablet (25 mg total) by mouth 2 (two) times daily. 60 tablet 4  . omeprazole (PRILOSEC) 40 MG capsule Take 40 mg by mouth daily.     Marland Kitchen oxyCODONE-acetaminophen (PERCOCET/ROXICET) 5-325 MG tablet 1 pill every 8-12 hours 20 tablet 0  . polyethylene glycol-electrolytes (NULYTELY/GOLYTELY) 420 g solution   0  . Sennosides (EX-LAX PO) Take 2 capsules by mouth as needed (constipation).    . iron polysaccharides (NU-IRON) 150 MG capsule Take 150 mg by mouth daily.      No current facility-administered medications for this visit.     PHYSICAL EXAMINATION: ECOG PERFORMANCE STATUS: 1 - Symptomatic but completely ambulatory  BP (!) 171/76 (BP Location: Left Arm, Patient Position: Sitting)   Pulse (!) 57   Temp 97.6 F (36.4 C) (Tympanic)   Resp 18   Wt 185 lb 11.2 oz (84.2 kg)   BMI 28.24 kg/m   Filed Weights   10/08/17 1421  Weight: 185 lb 11.2 oz (84.2 kg)   Physical Exam  Constitutional: He is oriented to person, place, and time and well-developed, well-nourished, and in no distress. No distress.  HENT:  Head: Normocephalic and atraumatic.  Nose: Nose normal.  Mouth/Throat: Oropharynx is clear and moist. No oropharyngeal exudate.  Eyes: Pupils are equal, round, and  reactive to light. EOM are normal. Left eye exhibits no discharge. No scleral icterus.  Neck: Normal range of motion. Neck supple. No JVD present.  Cardiovascular: Normal rate, regular rhythm and normal heart sounds.  No murmur heard. Pulmonary/Chest: Effort normal and breath sounds normal. No respiratory distress. He has no wheezes. He has no rales. He exhibits no tenderness.  Abdominal: Soft. He exhibits no distension and no mass. There is no tenderness. There is no rebound.  Musculoskeletal: Normal range of motion. He exhibits no edema or tenderness.  Lymphadenopathy:    He has no cervical adenopathy.  Neurological: He is alert and oriented to person, place, and time. No cranial nerve deficit. He exhibits normal muscle tone. Coordination normal.  Skin: Skin is warm and dry. No rash noted. He is not diaphoretic. No erythema.  Psychiatric: Affect normal.       LABORATORY DATA:  I have reviewed the data as listed    Component Value Date/Time   NA 139 10/08/2017 1353   NA 142 05/03/2014 1139   K 4.7 10/08/2017 1353   K  4.7 05/03/2014 1139   CL 110 10/08/2017 1353   CL 110 (H) 05/03/2014 1139   CO2 22 10/08/2017 1353   CO2 24 05/03/2014 1139   GLUCOSE 154 (H) 10/08/2017 1353   GLUCOSE 98 05/03/2014 1139   BUN 19 10/08/2017 1353   BUN 20 (H) 05/03/2014 1139   CREATININE 1.28 (H) 10/08/2017 1353   CREATININE 1.22 08/09/2014 1122   CALCIUM 9.1 10/08/2017 1353   CALCIUM 8.6 05/03/2014 1139   PROT 6.8 09/03/2017 1022   PROT 7.5 05/03/2014 1139   ALBUMIN 4.0 09/03/2017 1022   ALBUMIN 4.1 05/03/2014 1139   AST 14 (L) 09/03/2017 1022   AST 15 05/03/2014 1139   ALT 7 (L) 09/03/2017 1022   ALT 26 05/03/2014 1139   ALKPHOS 82 09/03/2017 1022   ALKPHOS 94 05/03/2014 1139   BILITOT 0.6 09/03/2017 1022   BILITOT 0.5 05/03/2014 1139   GFRNONAA 56 (L) 10/08/2017 1353   GFRNONAA >60 08/09/2014 1122   GFRAA >60 10/08/2017 1353   GFRAA >60 08/09/2014 1122    No results found for:  SPEP, UPEP  Lab Results  Component Value Date   WBC 10.5 10/08/2017   NEUTROABS 4.3 10/08/2017   HGB 8.9 (L) 10/08/2017   HCT 27.6 (L) 10/08/2017   MCV 100.1 (H) 10/08/2017   PLT 122 (L) 10/08/2017      Chemistry      Component Value Date/Time   NA 139 10/08/2017 1353   NA 142 05/03/2014 1139   K 4.7 10/08/2017 1353   K 4.7 05/03/2014 1139   CL 110 10/08/2017 1353   CL 110 (H) 05/03/2014 1139   CO2 22 10/08/2017 1353   CO2 24 05/03/2014 1139   BUN 19 10/08/2017 1353   BUN 20 (H) 05/03/2014 1139   CREATININE 1.28 (H) 10/08/2017 1353   CREATININE 1.22 08/09/2014 1122      Component Value Date/Time   CALCIUM 9.1 10/08/2017 1353   CALCIUM 8.6 05/03/2014 1139   ALKPHOS 82 09/03/2017 1022   ALKPHOS 94 05/03/2014 1139   AST 14 (L) 09/03/2017 1022   AST 15 05/03/2014 1139   ALT 7 (L) 09/03/2017 1022   ALT 26 05/03/2014 1139   BILITOT 0.6 09/03/2017 1022   BILITOT 0.5 05/03/2014 1139       RADIOGRAPHIC STUDIES: I have personally reviewed the radiological images as listed and agreed with the findings in the report. No results found.   ASSESSMENT & PLAN:  1. Generalized abdominal pain   2. CLL (chronic lymphoid leukemia) in relapse ([place])   3. Malignant lymphoma, small lymphocytic ([place])   4. Macrocytic anemia   5. Thrombocytopenia ([place])    # Patient's CLL appears to be stable.  Well controlled by ibrutinib.  Continue ibrutinib to 80 mg daily.  #Anemia and thrombocytopenia: Anemia slightly worse compared to 4 weeks ago.  Likely secondary to ibrutinib.  Continue to monitor.  Macrocytosis worsened.  Will check B12 and folate at next visit.  Stable thrombocytopenia.  #Chronic abdominal pain, etiology not identified.  Chronic use of narcotics.  Discussed with patient that I will only refill his pain medication for 10 days.  He should call Dr. B for additional refills if needed.  He voices understanding.  Orders Placed This Encounter  Procedures  . CBC with  Differential/Platelet    Standing Status:   Future    Standing Expiration Date:   10/09/2018  . Comprehensive metabolic panel    Standing Status:   Future  Standing Expiration Date:   10/09/2018  . Folate    Standing Status:   Future    Standing Expiration Date:   10/09/2018  . Vitamin B12    Standing Status:   Future    Standing Expiration Date:   10/09/2018   All questions were answered. The patient knows to call the clinic with any problems, questions or concerns.      Earlie Server, MD 10/08/2017 10:02 PM

## 2017-10-20 ENCOUNTER — Other Ambulatory Visit: Payer: Self-pay | Admitting: *Deleted

## 2017-10-20 ENCOUNTER — Other Ambulatory Visit: Payer: Self-pay | Admitting: Internal Medicine

## 2017-10-20 DIAGNOSIS — C8300 Small cell B-cell lymphoma, unspecified site: Secondary | ICD-10-CM

## 2017-10-20 DIAGNOSIS — C9192 Lymphoid leukemia, unspecified, in relapse: Secondary | ICD-10-CM

## 2017-10-20 DIAGNOSIS — C9112 Chronic lymphocytic leukemia of B-cell type in relapse: Secondary | ICD-10-CM

## 2017-10-20 MED ORDER — OXYCODONE-ACETAMINOPHEN 5-325 MG PO TABS: ORAL_TABLET | ORAL | 0 refills | Status: DC

## 2017-10-27 ENCOUNTER — Other Ambulatory Visit: Payer: Self-pay

## 2017-10-27 ENCOUNTER — Encounter
Admission: RE | Disposition: A | Discharge status: Home / Self Care | Payer: Self-pay | Source: Ambulatory Visit | Attending: Unknown Physician Specialty

## 2017-10-27 ENCOUNTER — Ambulatory Visit: Payer: Medicare Other | Admitting: Anesthesiology

## 2017-10-27 ENCOUNTER — Encounter: Payer: Self-pay | Admitting: Anesthesiology

## 2017-10-27 ENCOUNTER — Ambulatory Visit
Admission: RE | Admit: 2017-10-27 | Discharge: 2017-10-27 | Disposition: A | Discharge status: Home / Self Care | Payer: Medicare Other | Source: Ambulatory Visit | Attending: Unknown Physician Specialty | Admitting: Unknown Physician Specialty

## 2017-10-27 DIAGNOSIS — Z79891 Long term (current) use of opiate analgesic: Secondary | ICD-10-CM | POA: Diagnosis not present

## 2017-10-27 DIAGNOSIS — K766 Portal hypertension: Secondary | ICD-10-CM | POA: Insufficient documentation

## 2017-10-27 DIAGNOSIS — K5730 Diverticulosis of large intestine without perforation or abscess without bleeding: Secondary | ICD-10-CM | POA: Diagnosis not present

## 2017-10-27 DIAGNOSIS — K3189 Other diseases of stomach and duodenum: Secondary | ICD-10-CM | POA: Diagnosis not present

## 2017-10-27 DIAGNOSIS — I10 Essential (primary) hypertension: Secondary | ICD-10-CM | POA: Diagnosis not present

## 2017-10-27 DIAGNOSIS — Z7984 Long term (current) use of oral hypoglycemic drugs: Secondary | ICD-10-CM | POA: Diagnosis not present

## 2017-10-27 DIAGNOSIS — E1151 Type 2 diabetes mellitus with diabetic peripheral angiopathy without gangrene: Secondary | ICD-10-CM | POA: Insufficient documentation

## 2017-10-27 DIAGNOSIS — Z79899 Other long term (current) drug therapy: Secondary | ICD-10-CM | POA: Insufficient documentation

## 2017-10-27 DIAGNOSIS — F17210 Nicotine dependence, cigarettes, uncomplicated: Secondary | ICD-10-CM | POA: Diagnosis not present

## 2017-10-27 DIAGNOSIS — Z9049 Acquired absence of other specified parts of digestive tract: Secondary | ICD-10-CM | POA: Diagnosis not present

## 2017-10-27 DIAGNOSIS — D122 Benign neoplasm of ascending colon: Secondary | ICD-10-CM | POA: Diagnosis not present

## 2017-10-27 DIAGNOSIS — Q2730 Arteriovenous malformation, site unspecified: Secondary | ICD-10-CM | POA: Insufficient documentation

## 2017-10-27 DIAGNOSIS — Z7982 Long term (current) use of aspirin: Secondary | ICD-10-CM | POA: Insufficient documentation

## 2017-10-27 DIAGNOSIS — K640 First degree hemorrhoids: Secondary | ICD-10-CM | POA: Insufficient documentation

## 2017-10-27 DIAGNOSIS — Z856 Personal history of leukemia: Secondary | ICD-10-CM | POA: Diagnosis not present

## 2017-10-27 DIAGNOSIS — R1084 Generalized abdominal pain: Secondary | ICD-10-CM | POA: Diagnosis present

## 2017-10-27 HISTORY — PX: COLONOSCOPY WITH PROPOFOL: SHX5780

## 2017-10-27 HISTORY — PX: ESOPHAGOGASTRODUODENOSCOPY (EGD) WITH PROPOFOL: SHX5813

## 2017-10-27 LAB — GLUCOSE, CAPILLARY: Glucose-Capillary: 102 mg/dL — ABNORMAL HIGH (ref 70–99)

## 2017-10-27 SURGERY — ESOPHAGOGASTRODUODENOSCOPY (EGD) WITH PROPOFOL
Anesthesia: General

## 2017-10-27 MED ORDER — PROPOFOL 10 MG/ML IV BOLUS
INTRAVENOUS | Status: DC | PRN
  Administered 2017-10-27: 30 mg via INTRAVENOUS
  Administered 2017-10-27: 40 mg via INTRAVENOUS

## 2017-10-27 MED ORDER — SODIUM CHLORIDE 0.9 % IV SOLN: INTRAVENOUS | Status: DC

## 2017-10-27 MED ORDER — PHENYLEPHRINE HCL 10 MG/ML IJ SOLN
INTRAMUSCULAR | Status: DC | PRN
  Administered 2017-10-27: 100 ug via INTRAVENOUS

## 2017-10-27 MED ORDER — GLYCOPYRROLATE 0.2 MG/ML IJ SOLN
INTRAMUSCULAR | Status: DC | PRN
  Administered 2017-10-27: 0.2 mg via INTRAVENOUS

## 2017-10-27 MED ORDER — PROPOFOL 500 MG/50ML IV EMUL
INTRAVENOUS | Status: DC | PRN
  Administered 2017-10-27: 150 ug/kg/min via INTRAVENOUS

## 2017-10-27 MED ORDER — EPHEDRINE SULFATE 50 MG/ML IJ SOLN
INTRAMUSCULAR | Status: DC | PRN
  Administered 2017-10-27: 10 mg via INTRAVENOUS
  Administered 2017-10-27: 15 mg via INTRAVENOUS

## 2017-10-27 MED ORDER — PROPOFOL 500 MG/50ML IV EMUL
INTRAVENOUS | Status: AC
  Filled 2017-10-27: qty 50

## 2017-10-27 MED ORDER — SODIUM CHLORIDE 0.9 % IV SOLN
INTRAVENOUS | Status: DC
  Administered 2017-10-27 (×2): via INTRAVENOUS

## 2017-10-27 MED ORDER — FENTANYL CITRATE (PF) 100 MCG/2ML IJ SOLN
INTRAMUSCULAR | Status: AC
  Filled 2017-10-27: qty 2

## 2017-10-27 MED ORDER — LIDOCAINE HCL (CARDIAC) PF 100 MG/5ML IV SOSY
PREFILLED_SYRINGE | INTRAVENOUS | Status: DC | PRN
  Administered 2017-10-27: 100 mg via INTRATRACHEAL

## 2017-10-27 MED ORDER — FENTANYL CITRATE (PF) 100 MCG/2ML IJ SOLN
INTRAMUSCULAR | Status: DC | PRN
  Administered 2017-10-27: 25 ug via INTRAVENOUS

## 2017-10-27 MED FILL — IMBRUVICA 280 MG TAB: 280 | 28 days supply | Qty: 28 | Fill #5

## 2017-10-27 NOTE — Anesthesia Post-op Follow-up Note (Signed)
Anesthesia QCDR form completed.        

## 2017-10-27 NOTE — Op Note (Signed)
[facility] Gastroenterology Patient Name: James [name] Procedure Date: 10/27/2017 1:49 PM MRN: [id number] Account #: [id number] Date of Birth: 09/27/1950 Admit Type: Outpatient Age: 67 Room: [facility] ENDO ROOM 1 Gender: Male Note Status: Finalized Procedure:            Upper GI endoscopy Indications:          Epigastric abdominal pain, Generalized abdominal pain,                        Portal hypertensive gastropathy Providers:            Manya Silvas, MD Referring MD:         [name], MD (Referring MD) Medicines:            Propofol per Anesthesia Complications:        No immediate complications. Procedure:            Pre-Anesthesia Assessment:                       - After reviewing the risks and benefits, the patient                        was deemed in satisfactory condition to undergo the                        procedure.                       After obtaining informed consent, the endoscope was                        passed under direct vision. Throughout the procedure,                        the patient's blood pressure, pulse, and oxygen                        saturations were monitored continuously. The Endoscope                        was introduced through the mouth, and advanced to the                        second part of duodenum. The upper GI endoscopy was                        accomplished without difficulty. The patient tolerated                        the procedure well. Findings:      The examined esophagus was normal.      Diffuse portal hypertensive gastropathy was found in the gastric body.      Diffuse mildly erythematous mucosa without bleeding was found in the       gastric antrum.      The examined duodenum was normal. 2 superficial avm. Impression:           - Normal esophagus.                       - Portal hypertensive gastropathy.                       -  Erythematous mucosa in the antrum.                       - Normal  examined duodenum.                       - No specimens collected. Recommendation:       - Perform a colonoscopy as previously scheduled. Manya Silvas, MD 10/27/2017 2:05:52 PM This report has been signed electronically. Number of Addenda: 0 Note Initiated On: 10/27/2017 1:49 PM      [facility]

## 2017-10-27 NOTE — H&P (Signed)
Primary Care Physician:  [name], MD Primary Gastroenterologist:  Dr. Vira Agar  Pre-Procedure History & Physical: HPI:  James [name] is a 67 y.o. male is here for an colonoscopy and an endoscopy for follow up colon polyps and abdominal pain.   Past Medical History:  Diagnosis Date  . CLL (chronic lymphocytic leukemia) ([place])   . Diabetes mellitus without complication ([place])   . Hematuria   . Hypertension     Past Surgical History:  Procedure Laterality Date  . CATARACT EXTRACTION W/ INTRAOCULAR LENS IMPLANT Bilateral   . CHOLECYSTECTOMY  1983  . ESOPHAGOGASTRODUODENOSCOPY (EGD) WITH PROPOFOL N/A 11/20/2016   Procedure: ESOPHAGOGASTRODUODENOSCOPY (EGD) WITH PROPOFOL;  Surgeon: Manya Silvas, MD;  Location: [facility] ENDOSCOPY;  Service: Endoscopy;  Laterality: N/A;  . LOWER EXTREMITY ANGIOGRAPHY Right 05/19/2017   Procedure: LOWER EXTREMITY ANGIOGRAPHY;  Surgeon: Algernon Huxley, MD;  Location: [place] CV LAB;  Service: Cardiovascular;  Laterality: Right;    Prior to Admission medications   Medication Sig Start Date End Date Taking? Authorizing Provider  amLODipine (NORVASC) 10 MG tablet Take 1 tablet (10 mg total) by mouth daily. 12/13/16  Yes Cammie Sickle, MD  atorvastatin (LIPITOR) 10 MG tablet Take 1 tablet (10 mg total) by mouth daily. 05/19/17 05/19/18 Yes Dew, Erskine Squibb, MD  IMBRUVICA 280 MG TABS TAKE 1 CAPSULE BY MOUTH DAILY. 06/30/17  Yes Cammie Sickle, MD  iron polysaccharides (NU-IRON) 150 MG capsule Take 150 mg by mouth daily.  06/17/17 06/17/18 Yes [provider]  lisinopril (PRINIVIL,ZESTRIL) 30 MG tablet Take 30 mg by mouth daily. 09/16/17  Yes [provider]  metFORMIN (GLUMETZA) 1000 MG (MOD) 24 hr tablet Take 1,000 mg by mouth 2 (two) times daily.   Yes [provider]  metoprolol tartrate (LOPRESSOR) 25 MG tablet TAKE 1 TABLET BY MOUTH TWICE DAILY 10/20/17  Yes Cammie Sickle, MD  oxyCODONE-acetaminophen  (PERCOCET/ROXICET) 5-325 MG tablet 1 pill every 8-12 hours 10/20/17  Yes Cammie Sickle, MD  Sennosides (EX-LAX PO) Take 2 capsules by mouth as needed (constipation).   Yes [provider]  aspirin EC 81 MG tablet Take 81 mg by mouth daily.    [provider]  clopidogrel (PLAVIX) 75 MG tablet Take 1 tablet (75 mg total) by mouth daily. 05/19/17   Algernon Huxley, MD  omeprazole (PRILOSEC) 40 MG capsule Take 40 mg by mouth daily.  06/17/17   [provider]  polyethylene glycol-electrolytes (NULYTELY/GOLYTELY) 420 g solution  08/21/17   [provider]    Allergies as of 08/26/2017  . (No Known Allergies)    Family History  Problem Relation Age of Onset  . Hypertension Sister     Social History   Socioeconomic History  . Marital status: Married    Spouse name: Not on file  . Number of children: Not on file  . Years of education: Not on file  . Highest education level: Not on file  Occupational History  . Not on file  Social Needs  . Financial resource strain: Not on file  . Food insecurity:    Worry: Not on file    Inability: Not on file  . Transportation needs:    Medical: Not on file    Non-medical: Not on file  Tobacco Use  . Smoking status: Current Every Day Smoker    Packs/day: 0.50    Years: 47.00    Pack years: 23.50    Types: Cigarettes  . Smokeless tobacco:  Never Used  Substance and Sexual Activity  . Alcohol use: Yes    Alcohol/week: 0.6 oz    Types: 1 Glasses of wine per week    Comment:  almost none in last 6 months  . Drug use: No  . Sexual activity: Never    Partners: Female  Lifestyle  . Physical activity:    Days per week: Not on file    Minutes per session: Not on file  . Stress: Not on file  Relationships  . Social connections:    Talks on phone: Not on file    Gets together: Not on file    Attends religious service: Not on file    Active member of club or organization: Not on file    Attends meetings  of clubs or organizations: Not on file    Relationship status: Not on file  . Intimate partner violence:    Fear of current or ex partner: Not on file    Emotionally abused: Not on file    Physically abused: Not on file    Forced sexual activity: Not on file  Other Topics Concern  . Not on file  Social History Narrative  . Not on file    Review of Systems: See HPI, otherwise negative ROS  Physical Exam: BP (!) 156/69   Pulse (!) 49   Temp (!) 96.6 F (35.9 C)   Resp 18   Ht 5\' 8"  (1.727 m)   Wt 83.9 kg (185 lb)   SpO2 100%   BMI 28.13 kg/m  General:   Alert,  pleasant and cooperative in NAD Head:  Normocephalic and atraumatic. Neck:  Supple; no masses or thyromegaly. Lungs:  Clear throughout to auscultation.    Heart:  Regular rate and rhythm. Abdomen:  Soft, nontender and nondistended. Normal bowel sounds, without guarding, and without rebound.   Neurologic:  Alert and  oriented x4;  grossly normal neurologically.  Impression/Plan: James [name] is here for an endoscopy and colonoscopy to be performed for Personal history of colon polyps and abdominal pain.  Risks, benefits, limitations, and alternatives regarding  endoscopy and colonoscopy have been reviewed with the patient.  Questions have been answered.  All parties agreeable.   Gaylyn Cheers, MD  10/27/2017, 1:46 PM

## 2017-10-27 NOTE — Op Note (Signed)
[facility] Gastroenterology Patient Name: James [name] Procedure Date: 10/27/2017 1:48 PM MRN: [id number] Account #: [id number] Date of Birth: 1950-06-17 Admit Type: Outpatient Age: 67 Room: [facility] ENDO ROOM 1 Gender: Male Note Status: Finalized Procedure:            Colonoscopy Indications:          Generalized abdominal pain Providers:            Manya Silvas, MD Referring MD:         [name], MD (Referring MD) Medicines:            Propofol per Anesthesia Complications:        No immediate complications. Procedure:            Pre-Anesthesia Assessment:                       - After reviewing the risks and benefits, the patient                        was deemed in satisfactory condition to undergo the                        procedure.                       After obtaining informed consent, the colonoscope was                        passed under direct vision. Throughout the procedure,                        the patient's blood pressure, pulse, and oxygen                        saturations were monitored continuously. The                        Colonoscope was introduced through the anus and                        advanced to the the cecum, identified by appendiceal                        orifice and ileocecal valve. The colonoscopy was                        performed without difficulty. The patient tolerated the                        procedure well. The quality of the bowel preparation                        was adequate to identify polyps 6 mm and larger in size. Findings:      A diminutive polyp was found in the ascending colon. The polyp was       sessile. The polyp was removed with a jumbo cold forceps. Resection and       retrieval were complete.      A small polyp was found in the ascending colon. The polyp was sessile.       The polyp was removed with a cold  snare. Resection and retrieval were       complete. A clip was placed at the  site.      A few small-mouthed diverticula were found in the sigmoid colon.      Internal hemorrhoids were found during endoscopy. The hemorrhoids were       small and Grade I (internal hemorrhoids that do not prolapse).      The exam was otherwise without abnormality. Impression:           - One diminutive polyp in the ascending colon, removed                        with a jumbo cold forceps. Resected and retrieved.                       - One small polyp in the ascending colon, removed with                        a cold snare. Resected and retrieved.                       - Diverticulosis in the sigmoid colon.                       - Internal hemorrhoids.                       - The examination was otherwise normal. Recommendation:       - Await pathology results. Manya Silvas, MD 10/27/2017 2:34:10 PM This report has been signed electronically. Number of Addenda: 0 Note Initiated On: 10/27/2017 1:48 PM Scope Withdrawal Time: 0 hours 10 minutes 49 seconds  Total Procedure Duration: 0 hours 19 minutes 48 seconds       [facility]

## 2017-10-27 NOTE — Anesthesia Preprocedure Evaluation (Addendum)
Anesthesia Evaluation  Patient identified by MRN, date of birth, ID band Patient awake    Reviewed: Allergy & Precautions, NPO status , Patient's Chart, lab work & pertinent test results, reviewed documented beta blocker date and time   Airway Mallampati: III  TM Distance: >3 FB     Dental  (+) Upper Dentures, Lower Dentures   Pulmonary Current Smoker,           Cardiovascular hypertension, Pt. on medications and Pt. on home beta blockers + Peripheral Vascular Disease       Neuro/Psych    GI/Hepatic   Endo/Other  diabetes, Type 2  Renal/GU      Musculoskeletal   Abdominal   Peds  Hematology   Anesthesia Other Findings CLL.  Reproductive/Obstetrics                            Anesthesia Physical Anesthesia Plan  ASA: III  Anesthesia Plan: General   Post-op Pain Management:    Induction: Intravenous  PONV Risk Score and Plan:   Airway Management Planned:   Additional Equipment:   Intra-op Plan:   Post-operative Plan:   Informed Consent: I have reviewed the patients History and Physical, chart, labs and discussed the procedure including the risks, benefits and alternatives for the proposed anesthesia with the patient or authorized representative who has indicated his/her understanding and acceptance.     Plan Discussed with: CRNA  Anesthesia Plan Comments:         Anesthesia Quick Evaluation

## 2017-10-27 NOTE — Transfer of Care (Signed)
Immediate Anesthesia Transfer of Care Note  Patient: James [name]  Procedure(s) Performed: ESOPHAGOGASTRODUODENOSCOPY (EGD) WITH PROPOFOL (N/A ) COLONOSCOPY WITH PROPOFOL (N/A )  Patient Location: PACU  Anesthesia Type:General  Level of Consciousness: sedated  Airway & Oxygen Therapy: Patient Spontanous Breathing and Patient connected to nasal cannula oxygen  Post-op Assessment: Report given to RN and Post -op Vital signs reviewed and stable  Post vital signs: Reviewed and stable  Last Vitals:  Vitals Value Taken Time  BP    Temp    Pulse 55 10/27/2017  2:35 PM  Resp 16 10/27/2017  2:35 PM  SpO2 99 % 10/27/2017  2:35 PM  Vitals shown include unvalidated device data.  Last Pain:  Vitals:   10/27/17 1435  TempSrc: (P) Tympanic  PainSc:          Complications: No apparent anesthesia complications

## 2017-10-28 ENCOUNTER — Encounter: Payer: Self-pay | Admitting: Unknown Physician Specialty

## 2017-10-28 NOTE — Anesthesia Postprocedure Evaluation (Signed)
Anesthesia Post Note  Patient: James [name]  Procedure(s) Performed: ESOPHAGOGASTRODUODENOSCOPY (EGD) WITH PROPOFOL (N/A ) COLONOSCOPY WITH PROPOFOL (N/A )  Patient location during evaluation: Endoscopy Anesthesia Type: General Level of consciousness: awake and alert Pain management: pain level controlled Vital Signs Assessment: post-procedure vital signs reviewed and stable Respiratory status: spontaneous breathing, nonlabored ventilation, respiratory function stable and patient connected to nasal cannula oxygen Cardiovascular status: blood pressure returned to baseline and stable Postop Assessment: no apparent nausea or vomiting Anesthetic complications: no     Last Vitals:  Vitals:   10/27/17 1455 10/27/17 1505  BP: (!) 123/59 123/60  Pulse: (!) 55 (!) 54  Resp: 15 12  Temp:    SpO2: 99% 99%    Last Pain:  Vitals:   10/27/17 1505  TempSrc:   PainSc: 0-No pain                 Tomia Enlow S

## 2017-10-29 LAB — SURGICAL PATHOLOGY

## 2017-11-10 ENCOUNTER — Encounter (INDEPENDENT_AMBULATORY_CARE_PROVIDER_SITE_OTHER): Payer: Self-pay

## 2017-11-10 ENCOUNTER — Inpatient Hospital Stay: Payer: Medicare Other | Attending: Internal Medicine

## 2017-11-10 ENCOUNTER — Inpatient Hospital Stay (HOSPITAL_BASED_OUTPATIENT_CLINIC_OR_DEPARTMENT_OTHER): Payer: Medicare Other | Admitting: Internal Medicine

## 2017-11-10 VITALS — BP 161/68 | HR 50 | Temp 98.3°F | Resp 16 | Wt 186.4 lb

## 2017-11-10 DIAGNOSIS — I10 Essential (primary) hypertension: Secondary | ICD-10-CM | POA: Insufficient documentation

## 2017-11-10 DIAGNOSIS — F17210 Nicotine dependence, cigarettes, uncomplicated: Secondary | ICD-10-CM | POA: Insufficient documentation

## 2017-11-10 DIAGNOSIS — C8300 Small cell B-cell lymphoma, unspecified site: Secondary | ICD-10-CM

## 2017-11-10 DIAGNOSIS — C9112 Chronic lymphocytic leukemia of B-cell type in relapse: Secondary | ICD-10-CM

## 2017-11-10 DIAGNOSIS — R1013 Epigastric pain: Secondary | ICD-10-CM

## 2017-11-10 DIAGNOSIS — D539 Nutritional anemia, unspecified: Secondary | ICD-10-CM

## 2017-11-10 DIAGNOSIS — D649 Anemia, unspecified: Secondary | ICD-10-CM | POA: Insufficient documentation

## 2017-11-10 DIAGNOSIS — R1084 Generalized abdominal pain: Secondary | ICD-10-CM

## 2017-11-10 DIAGNOSIS — D696 Thrombocytopenia, unspecified: Secondary | ICD-10-CM

## 2017-11-10 DIAGNOSIS — Z923 Personal history of irradiation: Secondary | ICD-10-CM | POA: Diagnosis not present

## 2017-11-10 DIAGNOSIS — C9192 Lymphoid leukemia, unspecified, in relapse: Secondary | ICD-10-CM

## 2017-11-10 DIAGNOSIS — Z85038 Personal history of other malignant neoplasm of large intestine: Secondary | ICD-10-CM | POA: Diagnosis present

## 2017-11-10 LAB — FOLATE: Folate: 5.1 ng/mL — ABNORMAL LOW (ref >5.9)

## 2017-11-10 LAB — CBC WITH DIFFERENTIAL/PLATELET
BASOS PCT: 2 %
Basophils Absolute: 0.1 10*3/uL (ref 0–0.1)
EOS ABS: 0.1 10*3/uL (ref 0–0.7)
Eosinophils Relative: 1 %
HCT: 27.9 % — ABNORMAL LOW (ref 40.0–52.0)
Hemoglobin: 9.2 g/dL — ABNORMAL LOW (ref 13.0–18.0)
Lymphocytes Relative: 33 %
Lymphs Abs: 2.7 10*3/uL (ref 1.0–3.6)
MCH: 32.7 pg (ref 26.0–34.0)
MCHC: 32.8 g/dL (ref 32.0–36.0)
MCV: 99.6 fL (ref 80.0–100.0)
MONO ABS: 1 10*3/uL (ref 0.2–1.0)
Monocytes Relative: 12 %
NEUTROS PCT: 52 %
Neutro Abs: 4.2 10*3/uL (ref 1.4–6.5)
PLATELETS: 98 10*3/uL — AB (ref 150–440)
RBC: 2.8 MIL/uL — ABNORMAL LOW (ref 4.40–5.90)
RDW: 19 % — AB (ref 11.5–14.5)
WBC: 8.1 10*3/uL (ref 3.8–10.6)

## 2017-11-10 LAB — COMPREHENSIVE METABOLIC PANEL
ALT: 13 U/L (ref 0–44)
ANION GAP: 10 (ref 5–15)
AST: 16 U/L (ref 15–41)
Albumin: 4 g/dL (ref 3.5–5.0)
Alkaline Phosphatase: 90 U/L (ref 38–126)
BILIRUBIN TOTAL: 0.7 mg/dL (ref 0.3–1.2)
BUN: 17 mg/dL (ref 8–23)
CALCIUM: 8.8 mg/dL — AB (ref 8.9–10.3)
CO2: 26 mmol/L (ref 22–32)
CREATININE: 1.32 mg/dL — AB (ref 0.61–1.24)
Chloride: 106 mmol/L (ref 98–111)
GFR calc Af Amer: >60 mL/min (ref >60)
GFR calc non Af Amer: 54 mL/min — ABNORMAL LOW (ref >60)
Glucose, Bld: 106 mg/dL — ABNORMAL HIGH (ref 70–99)
POTASSIUM: 4.6 mmol/L (ref 3.5–5.1)
Sodium: 142 mmol/L (ref 135–145)
Total Protein: 7 g/dL (ref 6.5–8.1)

## 2017-11-10 LAB — VITAMIN B12: VITAMIN B 12: 907 pg/mL (ref 180–914)

## 2017-11-10 MED ORDER — OXYCODONE-ACETAMINOPHEN 5-325 MG PO TABS: ORAL_TABLET | ORAL | 0 refills | Status: DC

## 2017-11-10 NOTE — Assessment & Plan Note (Addendum)
#  CLL/SLL- relapsed currently on second line therapy with ibrutinib September 2016.  March 2019-CT scan shows no progressive/worsening lymphadenopathy.  #Continue ibrutinib at 280 mg/day.  No obvious evidence of clinical progression. STABLE.   # Anemia-hemoglobin 9.2/platelet 98-secondary to ibrutinib. If worse; recommend Bone marrow Biopsy or Holding ibrutinib.   #Abdominal pain/dyspepsia/unclear etiology; not improving.  S/p EGD/colonoscopy- no reason for pain found.  Continue Percocet; discussed narcotic pain medication contract; reviewed the terms and conditions.  Patient agrees; signed pain contract.  #Elevated blood pressure-secondary to ibrutinib; again reminded to bring a log of his blood pressures at next visit. STABLE.   # folow up in 2 monthslabs- cbc/bmp.   CC: [name]/[name].

## 2017-11-10 NOTE — Progress Notes (Signed)
[place] OFFICE PROGRESS NOTE  Patient Care Team: [name], MD as PCP - General (Internal Medicine)  Cancer Staging No matching staging information was found for the patient.   Oncology History   # 2006- CLL STAGE IV; MAY 2011- WBC- 57K;Platelets-99;Hb-12/CT Bulky LN; BMBx- 80% Invol; del 11; START Benda-Ritux x4 [finished Sep 2011];   # July 2015-Progression; Sep 2015-START ibrutinib; CT scan DEC 2015- Improvement LN; Cont Ibrutinib 138m/d; NOV 2016 CT- 1-2CM LN [mild progression compared to Dec 2015];NOV 2016- FISH peripheral blood- NO MUTATIONS/CD-38 Positive; NOV 7th- CONT IBRUTINIB 2 pills/day; CT AUG 2017- STABLE;  DEC 6th PET- Mild RP LN/ Retrocrural LN   SA skin infection [Oct 2016] s/p clinda     CLL (chronic lymphoid [place]) [place] ([place]      INTERVAL HISTORY:  James Blase61y.o.  male pleasant patient above history of CLL and chronic abdominal pain of unclear etiology is here for follow-up.  Patient is currently on ibrutinib 280 mg a day.  Patient continues to have chronic abdominal pain for which she is on Percocet.  He has had recent GI evaluation with EGD colonoscopy that was for an acute process.  Patient's weight is currently stable.  No nausea no vomiting.  Review of Systems  Constitutional: Positive for malaise/fatigue. Negative for chills, diaphoresis, fever and weight loss.  HENT: Negative for nosebleeds and sore throat.   Eyes: Negative for double vision.  Respiratory: Negative for cough, hemoptysis, sputum production, shortness of breath and wheezing.   Cardiovascular: Negative for chest pain, palpitations, orthopnea and leg swelling.  Gastrointestinal: Positive for abdominal pain and nausea. Negative for blood in stool, constipation, diarrhea, heartburn, melena and vomiting.  Genitourinary: Negative for dysuria, frequency and urgency.  Musculoskeletal: Positive for back pain and joint pain.  Skin: Negative.  Negative  for itching and rash.  Neurological: Negative for dizziness, tingling, focal weakness, weakness and headaches.  Endo/Heme/Allergies: Does not bruise/bleed easily.  Psychiatric/Behavioral: Negative for depression. The patient is not nervous/anxious and does not have insomnia.       PAST MEDICAL HISTORY :  Past Medical History:  Diagnosis Date  . [place]ocytic [place]) [place]   . Diabetes mellitus without complication ([place]   . Hematuria   . Hypertension     PAST SURGICAL HISTORY :   Past Surgical History:  Procedure Laterality Date  . CATARACT EXTRACTION W/ INTRAOCULAR LENS IMPLANT Bilateral   . CHOLECYSTECTOMY  1983  . COLONOSCOPY WITH PROPOFOL N/A 10/27/2017   Procedure: COLONOSCOPY WITH PROPOFOL;  Surgeon: EManya Silvas MD;  [facility]  Service: Endoscopy;  Laterality: N/A;  . ESOPHAGOGASTRODUODENOSCOPY (EGD) WITH PROPOFOL N/A 11/20/2016   Procedure: ESOPHAGOGASTRODUODENOSCOPY (EGD) WITH PROPOFOL;  Surgeon: EManya Silvas MD;  [facility];  Service: Endoscopy;  Laterality: N/A;  . ESOPHAGOGASTRODUODENOSCOPY (EGD) WITH PROPOFOL N/A 10/27/2017   Procedure: ESOPHAGOGASTRODUODENOSCOPY (EGD) WITH PROPOFOL;  Surgeon: EManya Silvas MD;  [facility];  Service: Endoscopy;  Laterality: N/A;  . LOWER EXTREMITY ANGIOGRAPHY Right 05/19/2017   Procedure: LOWER EXTREMITY ANGIOGRAPHY;  Surgeon: DAlgernon Huxley MD;  [facility];  Service: Cardiovascular;  Laterality: Right;    FAMILY HISTORY :   Family History  Problem Relation Age of Onset  . Hypertension Sister     SOCIAL HISTORY:   Social History   Tobacco Use  . Smoking status: Current Every Day Smoker    Packs/day: 0.50    Years: 47.00  Pack years: 23.50    Types: Cigarettes  . Smokeless tobacco: Never Used  Substance Use Topics  . Alcohol use: Yes    Alcohol/week: 0.6 oz    Types: 1 Glasses of wine per week    Comment:  almost none in last 6  months  . Drug use: No    ALLERGIES:  has No Known Allergies.  MEDICATIONS:  Current Outpatient Medications  Medication Sig Dispense Refill  . amLODipine (NORVASC) 10 MG tablet Take 1 tablet (10 mg total) by mouth daily. 30 tablet 3  . aspirin EC 81 MG tablet Take 81 mg by mouth daily.    Marland Kitchen atorvastatin (LIPITOR) 10 MG tablet Take 1 tablet (10 mg total) by mouth daily. 30 tablet 11  . clopidogrel (PLAVIX) 75 MG tablet Take 1 tablet (75 mg total) by mouth daily. 30 tablet 11  . IMBRUVICA 280 MG TABS TAKE 1 CAPSULE BY MOUTH DAILY. 28 tablet 6  . iron polysaccharides (NU-IRON) 150 MG capsule Take 150 mg by mouth daily.     Marland Kitchen lisinopril (PRINIVIL,ZESTRIL) 30 MG tablet Take 30 mg by mouth daily.  1  . metFORMIN (GLUMETZA) 1000 MG (MOD) 24 hr tablet Take 1,000 mg by mouth 2 (two) times daily.    . metoprolol tartrate (LOPRESSOR) 25 MG tablet TAKE 1 TABLET BY MOUTH TWICE DAILY 180 tablet 1  . omeprazole (PRILOSEC) 40 MG capsule Take 40 mg by mouth daily.     Marland Kitchen oxyCODONE-acetaminophen (PERCOCET/ROXICET) 5-325 MG tablet 1 pill every 8-12 hours 60 tablet 0  . polyethylene glycol-electrolytes (NULYTELY/GOLYTELY) 420 g solution   0  . Sennosides (EX-LAX PO) Take 2 capsules by mouth as needed (constipation).     No current facility-administered medications for this visit.     PHYSICAL EXAMINATION: ECOG PERFORMANCE STATUS: 1 - Symptomatic but completely ambulatory  BP (!) 161/68 (BP Location: Left Arm, Patient Position: Sitting)   Pulse (!) 50   Temp 98.3 F (36.8 C) (Tympanic)   Resp 16   Wt 186 lb 6.4 oz (84.6 kg)   BMI 28.34 kg/m   Filed Weights   11/10/17 1043  Weight: 186 lb 6.4 oz (84.6 kg)    GENERAL: Well-nourished well-developed; Alert, no distress and comfortable.  Alone. EYES: no pallor or icterus OROPHARYNX: no thrush or ulceration; NECK: supple; no lymph nodes felt. LYMPH:  no palpable lymphadenopathy in the axillary or inguinal regions LUNGS: Decreased breath sounds  auscultation bilaterally. No wheeze or crackles HEART/CVS: regular rate & rhythm and no murmurs; No lower extremity edema ABDOMEN:abdomen soft, non-tender and normal bowel sounds. No hepatomegaly or splenomegaly.  Musculoskeletal:no cyanosis of digits and no clubbing  PSYCH: alert & oriented x 3 with fluent speech NEURO: no focal motor/sensory deficits SKIN:  no rashes or significant lesions    LABORATORY DATA:  I have reviewed the data as listed    Component Value Date/Time   NA 142 11/10/2017 1002   NA 142 05/03/2014 1139   K 4.6 11/10/2017 1002   K 4.7 05/03/2014 1139   CL 106 11/10/2017 1002   CL 110 (H) 05/03/2014 1139   CO2 26 11/10/2017 1002   CO2 24 05/03/2014 1139   GLUCOSE 106 (H) 11/10/2017 1002   GLUCOSE 98 05/03/2014 1139   BUN 17 11/10/2017 1002   BUN 20 (H) 05/03/2014 1139   CREATININE 1.32 (H) 11/10/2017 1002   CREATININE 1.22 08/09/2014 1122   CALCIUM 8.8 (L) 11/10/2017 1002   CALCIUM 8.6 05/03/2014 1139   PROT  7.0 11/10/2017 1002   PROT 7.5 05/03/2014 1139   ALBUMIN 4.0 11/10/2017 1002   ALBUMIN 4.1 05/03/2014 1139   AST 16 11/10/2017 1002   AST 15 05/03/2014 1139   ALT 13 11/10/2017 1002   ALT 26 05/03/2014 1139   ALKPHOS 90 11/10/2017 1002   ALKPHOS 94 05/03/2014 1139   BILITOT 0.7 11/10/2017 1002   BILITOT 0.5 05/03/2014 1139   GFRNONAA 54 (L) 11/10/2017 1002   GFRNONAA >60 08/09/2014 1122   GFRAA >60 11/10/2017 1002   GFRAA >60 08/09/2014 1122    No results found for: SPEP, UPEP  Lab Results  Component Value Date   WBC 8.1 11/10/2017   NEUTROABS 4.2 11/10/2017   HGB 9.2 (L) 11/10/2017   HCT 27.9 (L) 11/10/2017   MCV 99.6 11/10/2017   PLT 98 (L) 11/10/2017      Chemistry      Component Value Date/Time   NA 142 11/10/2017 1002   NA 142 05/03/2014 1139   K 4.6 11/10/2017 1002   K 4.7 05/03/2014 1139   CL 106 11/10/2017 1002   CL 110 (H) 05/03/2014 1139   CO2 26 11/10/2017 1002   CO2 24 05/03/2014 1139   BUN 17 11/10/2017 1002    BUN 20 (H) 05/03/2014 1139   CREATININE 1.32 (H) 11/10/2017 1002   CREATININE 1.22 08/09/2014 1122      Component Value Date/Time   CALCIUM 8.8 (L) 11/10/2017 1002   CALCIUM 8.6 05/03/2014 1139   ALKPHOS 90 11/10/2017 1002   ALKPHOS 94 05/03/2014 1139   AST 16 11/10/2017 1002   AST 15 05/03/2014 1139   ALT 13 11/10/2017 1002   ALT 26 05/03/2014 1139   BILITOT 0.7 11/10/2017 1002   BILITOT 0.5 05/03/2014 1139       RADIOGRAPHIC STUDIES: I have personally reviewed the radiological images as listed and agreed with the findings in the report. No results found.   ASSESSMENT & PLAN:  CLL (chronic lymphoid leukemia) in relapse ([place]) # CLL/SLL- relapsed currently on second line therapy with ibrutinib September 2016.  March 2019-CT scan shows no progressive/worsening lymphadenopathy.  #Continue ibrutinib at 280 mg/day.  No obvious evidence of clinical progression. STABLE.   # Anemia-hemoglobin 9.2/platelet 98-secondary to ibrutinib. If worse; recommend Bone marrow Biopsy or Holding ibrutinib.   #Abdominal pain/dyspepsia/unclear etiology; not improving.  S/p EGD/colonoscopy- no reason for pain found.  Continue Percocet; discussed narcotic pain medication contract; reviewed the terms and conditions.  Patient agrees; signed pain contract.  #Elevated blood pressure-secondary to ibrutinib; again reminded to bring a log of his blood pressures at next visit. STABLE.   # folow up in 2 monthslabs- cbc/bmp.   CC: [name]/[name].    No orders of the defined types were placed in this encounter.  All questions were answered. The patient knows to call the clinic with any problems, questions or concerns.      Cammie Sickle, MD 11/11/2017 7:40 PM

## 2017-11-20 MED FILL — IMBRUVICA 280 MG TAB: 280 | 28 days supply | Qty: 28 | Fill #6

## 2017-11-27 ENCOUNTER — Other Ambulatory Visit: Payer: Self-pay | Admitting: *Deleted

## 2017-11-27 DIAGNOSIS — C9112 Chronic lymphocytic leukemia of B-cell type in relapse: Secondary | ICD-10-CM

## 2017-11-27 DIAGNOSIS — C9192 Lymphoid leukemia, unspecified, in relapse: Secondary | ICD-10-CM

## 2017-11-27 DIAGNOSIS — C8300 Small cell B-cell lymphoma, unspecified site: Secondary | ICD-10-CM

## 2017-11-27 NOTE — Telephone Encounter (Signed)
MD will not RF any sooner. Per md - patient should not be adjusting his own pain medication. Per md patient is on pain contract.

## 2017-11-27 NOTE — Telephone Encounter (Signed)
Patient requesting refill of his Oxycodone, He got #60 on 7/15 sig 1 tablet every 8 - 12 hours. I asked how he is using it and he states he is having to take 3 - 4 tabs a day to control his pain. Please advise

## 2017-12-01 ENCOUNTER — Other Ambulatory Visit: Payer: Self-pay | Admitting: *Deleted

## 2017-12-01 DIAGNOSIS — C9112 Chronic lymphocytic leukemia of B-cell type in relapse: Secondary | ICD-10-CM

## 2017-12-01 DIAGNOSIS — C8300 Small cell B-cell lymphoma, unspecified site: Secondary | ICD-10-CM

## 2017-12-01 DIAGNOSIS — C9192 Lymphoid leukemia, unspecified, in relapse: Secondary | ICD-10-CM

## 2017-12-01 MED ORDER — OXYCODONE-ACETAMINOPHEN 5-325 MG PO TABS: 1.0000 | ORAL_TABLET | Freq: Two times a day (BID) | ORAL | 0 refills | Status: DC | PRN

## 2017-12-15 ENCOUNTER — Other Ambulatory Visit: Payer: Self-pay | Admitting: Internal Medicine

## 2017-12-15 DIAGNOSIS — C9112 Chronic lymphocytic leukemia of B-cell type in relapse: Secondary | ICD-10-CM

## 2017-12-15 DIAGNOSIS — C9192 Lymphoid leukemia, unspecified, in relapse: Secondary | ICD-10-CM

## 2017-12-16 ENCOUNTER — Telehealth: Payer: Self-pay | Admitting: Pharmacy Technician

## 2017-12-16 MED FILL — IMBRUVICA 280 MG TAB: 280 | 28 days supply | Qty: 28 | Fill #0

## 2017-12-16 NOTE — Telephone Encounter (Signed)
Oral Oncology Patient Advocate Encounter   Received notification from [employer] that the patients grant funding expired.  I called to re-enroll patient in grant funding through [facility].  I also enrolled patient in another foundation grant to help cover the copay through next year.  Billing information for both grants has been sent to [facility] and is stated below:   1) Was successful in securing patient an $7900 grant from Patient [place] [facility]) to provide copayment coverage for Imbruvica.  This will keep the out of pocket expense at $0.    Member ID: [id number] Group ID: [id number] RxBin: [id number] Dates of Eligibility: 11/27/2017 through 11/27/2018    2) Was successful in securing patient a $8000 grant from [employer] to provide copayment coverage for [place]. This will keep the out of pocket expense at $0.   [employer] ID: [id number]  RxBin: [place] Member ID: [id number] Group ID: [id number] PCN: [id number]  Dates of Eligibility: 11/16/2017 through 11/16/2018  [place] Patient [place] Phone [phone] Fax [phone] 12/16/2017 11:01 AM

## 2017-12-22 ENCOUNTER — Other Ambulatory Visit: Payer: Self-pay | Admitting: *Deleted

## 2017-12-22 ENCOUNTER — Other Ambulatory Visit (INDEPENDENT_AMBULATORY_CARE_PROVIDER_SITE_OTHER): Payer: Self-pay | Admitting: Vascular Surgery

## 2017-12-22 DIAGNOSIS — C9112 Chronic lymphocytic leukemia of B-cell type in relapse: Secondary | ICD-10-CM

## 2017-12-22 DIAGNOSIS — C8300 Small cell B-cell lymphoma, unspecified site: Secondary | ICD-10-CM

## 2017-12-22 DIAGNOSIS — I739 Peripheral vascular disease, unspecified: Secondary | ICD-10-CM

## 2017-12-22 DIAGNOSIS — C9192 Lymphoid leukemia, unspecified, in relapse: Secondary | ICD-10-CM

## 2017-12-22 MED ORDER — OXYCODONE-ACETAMINOPHEN 5-325 MG PO TABS: ORAL_TABLET | ORAL | 0 refills | Status: DC

## 2017-12-23 ENCOUNTER — Encounter (INDEPENDENT_AMBULATORY_CARE_PROVIDER_SITE_OTHER): Payer: Self-pay | Admitting: Vascular Surgery

## 2017-12-23 ENCOUNTER — Ambulatory Visit (INDEPENDENT_AMBULATORY_CARE_PROVIDER_SITE_OTHER): Payer: Medicare Other | Admitting: Vascular Surgery

## 2017-12-23 ENCOUNTER — Ambulatory Visit (INDEPENDENT_AMBULATORY_CARE_PROVIDER_SITE_OTHER): Payer: Medicare Other

## 2017-12-23 VITALS — BP 154/71 | HR 45 | Resp 14 | Ht 68.0 in | Wt 176.0 lb

## 2017-12-23 DIAGNOSIS — I70219 Atherosclerosis of native arteries of extremities with intermittent claudication, unspecified extremity: Secondary | ICD-10-CM

## 2017-12-23 DIAGNOSIS — I70211 Atherosclerosis of native arteries of extremities with intermittent claudication, right leg: Secondary | ICD-10-CM | POA: Diagnosis not present

## 2017-12-23 DIAGNOSIS — I10 Essential (primary) hypertension: Secondary | ICD-10-CM

## 2017-12-23 DIAGNOSIS — I70212 Atherosclerosis of native arteries of extremities with intermittent claudication, left leg: Secondary | ICD-10-CM

## 2017-12-23 DIAGNOSIS — I739 Peripheral vascular disease, unspecified: Secondary | ICD-10-CM

## 2017-12-23 DIAGNOSIS — E785 Hyperlipidemia, unspecified: Secondary | ICD-10-CM | POA: Diagnosis not present

## 2017-12-23 DIAGNOSIS — E118 Type 2 diabetes mellitus with unspecified complications: Secondary | ICD-10-CM

## 2017-12-23 NOTE — Patient Instructions (Signed)

## 2017-12-23 NOTE — Progress Notes (Signed)
MRN : [id number]  James [name] is a 67 y.o. (1950-12-26) male who presents with chief complaint of  Chief Complaint  Patient presents with  . Follow-up    Ultrasound follow up  .  History of Present Illness: Patient returns today in follow up of PAD.  Earlier this year, he underwent angioplasty of bilateral iliac arteries and angioplasty and stenting of the right SFA and popliteal arteries for severe PAD.  Since that time, he has done well.  He is not having any current lifestyle limiting claudication, ischemic rest pain, or ulceration after revascularization.  He does have some mild claudication symptoms bilaterally.  His noninvasive studies today demonstrate a right ABI of 1.0 and a left ABI of 0.59 aortoiliac duplex showed an ectatic aorta of about 2.8 cm but not frankly aneurysmal.  Current Outpatient Medications  Medication Sig Dispense Refill  . amLODipine (NORVASC) 10 MG tablet Take 1 tablet (10 mg total) by mouth daily. 30 tablet 3  . amoxicillin-clavulanate (AUGMENTIN) 875-125 MG tablet Take 1 tablet by mouth 2 (two) times daily.  0  . aspirin EC 81 MG tablet Take 81 mg by mouth daily.    Marland Kitchen atorvastatin (LIPITOR) 10 MG tablet Take 1 tablet (10 mg total) by mouth daily. 30 tablet 11  . clopidogrel (PLAVIX) 75 MG tablet Take 1 tablet (75 mg total) by mouth daily. 30 tablet 11  . IMBRUVICA 280 MG TABS TAKE 1 TABLET BY MOUTH DAILY. 28 tablet 6  . iron polysaccharides (NU-IRON) 150 MG capsule Take 150 mg by mouth daily.     Marland Kitchen lisinopril (PRINIVIL,ZESTRIL) 30 MG tablet Take 30 mg by mouth daily.  1  . metFORMIN (GLUMETZA) 1000 MG (MOD) 24 hr tablet Take 1,000 mg by mouth 2 (two) times daily.    . metoprolol tartrate (LOPRESSOR) 25 MG tablet TAKE 1 TABLET BY MOUTH TWICE DAILY 180 tablet 1  . omeprazole (PRILOSEC) 40 MG capsule Take 40 mg by mouth daily.     Marland Kitchen oxyCODONE-acetaminophen (PERCOCET/ROXICET) 5-325 MG tablet 1 pill every 8-12 hours 60 tablet 0  . polyethylene  glycol-electrolytes (NULYTELY/GOLYTELY) 420 g solution   0  . Sennosides (EX-LAX PO) Take 2 capsules by mouth as needed (constipation).     No current facility-administered medications for this visit.     Past Medical History:  Diagnosis Date  . CLL (chronic lymphocytic leukemia) ([place])   . Diabetes mellitus without complication ([place])   . Hematuria   . Hypertension     Past Surgical History:  Procedure Laterality Date  . CATARACT EXTRACTION W/ INTRAOCULAR LENS IMPLANT Bilateral   . CHOLECYSTECTOMY  1983  . COLONOSCOPY WITH PROPOFOL N/A 10/27/2017   Procedure: COLONOSCOPY WITH PROPOFOL;  Surgeon: Manya Silvas, MD;  Location: [facility] ENDOSCOPY;  Service: Endoscopy;  Laterality: N/A;  . ESOPHAGOGASTRODUODENOSCOPY (EGD) WITH PROPOFOL N/A 11/20/2016   Procedure: ESOPHAGOGASTRODUODENOSCOPY (EGD) WITH PROPOFOL;  Surgeon: Manya Silvas, MD;  Location: [facility] ENDOSCOPY;  Service: Endoscopy;  Laterality: N/A;  . ESOPHAGOGASTRODUODENOSCOPY (EGD) WITH PROPOFOL N/A 10/27/2017   Procedure: ESOPHAGOGASTRODUODENOSCOPY (EGD) WITH PROPOFOL;  Surgeon: Manya Silvas, MD;  Location: [facility] ENDOSCOPY;  Service: Endoscopy;  Laterality: N/A;  . LOWER EXTREMITY ANGIOGRAPHY Right 05/19/2017   Procedure: LOWER EXTREMITY ANGIOGRAPHY;  Surgeon: Algernon Huxley, MD;  Location: [facility];  Service: Cardiovascular;  Laterality: Right;    Social History        Tobacco Use  . Smoking status: Current Every Day Smoker    Packs/day:  1.00    Years: 47.00    Pack years: 47.00    Types: Cigarettes  . Smokeless tobacco: Never Used  Substance Use Topics  . Alcohol use: Yes    Alcohol/week: 3.0 oz    Types: 5 Glasses of wine per week    Comment: 5 a week  . Drug use: No    Family History      Family History  Problem Relation Age of Onset  . Hypertension Sister     No Known Allergies   REVIEW OF SYSTEMS (Negative unless checked)  Constitutional: _0 Weight loss  _1 Fever   _2 Chills Cardiac: _3 Chest pain   _4 Chest pressure   _5 Palpitations   _6 Shortness of breath when laying flat   _7 Shortness of breath at rest   _8 Shortness of breath with exertion. Vascular:  _9 Pain in legs with walking   _10 Pain in legs at rest   _11 Pain in legs when laying flat   _12 Claudication   _13 Pain in feet when walking  _14 Pain in feet at rest  _15 Pain in feet when laying flat   _16 History of DVT   _17 Phlebitis   _18 Swelling in legs   _19 Varicose veins   _20 Non-healing ulcers Pulmonary:   _21 Uses home oxygen   _22 Productive cough   _23 Hemoptysis   _24 Wheeze  _25 COPD   _26 Asthma Neurologic:  _27 Dizziness  _28 Blackouts   _29 Seizures   _30 History of stroke   _31 History of TIA  _32 Aphasia   _33 Temporary blindness   _34 Dysphagia   _35 Weakness or numbness in arms   _36 Weakness or numbness in legs Musculoskeletal:  _37 Arthritis   _38 Joint swelling   _39 Joint pain   _40 Low back pain Hematologic:  _41 Easy bruising  _42 Easy bleeding   _43 Hypercoagulable state   _44 Anemic   Gastrointestinal:  _45 Blood in stool   _46 Vomiting blood  _47 Gastroesophageal reflux/heartburn   _48 Abdominal pain Genitourinary:  _49 Chronic kidney disease   _50 Difficult urination  _51 Frequent urination  _52 Burning with urination   _53 Hematuria Skin:  _54 Rashes   _55 Ulcers   _56 Wounds Psychological:  _57 History of anxiety   _58  History of major depression.    Physical Examination  BP (!) 154/71 (BP Location: Right Arm, Patient Position: Sitting)   Pulse (!) 45   Resp 14   Ht _59  (1.727 m)   Wt 176 lb (79.8 kg)   BMI 26.76 kg/m  Gen:  WD/WN, NAD Head: [place]/AT, No temporalis wasting. Ear/Nose/Throat: Hearing grossly intact, nares w/o erythema or drainage Eyes: Conjunctiva clear. Sclera non-icteric Neck: Supple.  Trachea midline Pulmonary:  Good air movement, no use of accessory muscles.  Cardiac: RRR, no JVD Vascular:  Vessel Right Left  Radial Palpable Palpable                          PT 2+ Palpable 1+ Palpable  DP 1+ Palpable 1+ Palpable     Musculoskeletal: M/S 5/5 throughout.  No deformity or atrophy. Moderate stasis changes, minimal edema. Neurologic: Sensation grossly intact in extremities.  Symmetrical.  Speech is fluent.  Psychiatric: Judgment intact, Mood & affect appropriate for pt's clinical situation. Dermatologic: No rashes or ulcers noted.  No cellulitis or open wounds.       Labs Recent Results (from the past 2160 hour(s))  CBC with Differential     Status: Abnormal   Collection Time: 10/08/17  1:53 PM  Result Value Ref Range   WBC 10.5 3.8 - 10.6 K/uL   RBC 2.76 (L) 4.40 - 5.90 MIL/uL   Hemoglobin 8.9 (L)  13.0 - 18.0 g/dL   HCT 27.6 (L) 40.0 - 52.0 %   MCV 100.1 (H) 80.0 - 100.0 fL   MCH 32.3 26.0 - 34.0 pg   MCHC 32.3 32.0 - 36.0 g/dL   RDW 18.9 (H) 11.5 - 14.5 %   Platelets 122 (L) 150 - 440 K/uL   Neutrophils Relative % 41 %   Neutro Abs 4.3 1.4 - 6.5 K/uL   Lymphocytes Relative 44 %   Lymphs Abs 4.6 (H) 1.0 - 3.6 K/uL   Monocytes Relative 12 %   Monocytes Absolute 1.2 (H) 0.2 - 1.0 K/uL   Eosinophils Relative 1 %   Eosinophils Absolute 0.1 0 - 0.7 K/uL   Basophils Relative 2 %   Basophils Absolute 0.2 (H) 0 - 0.1 K/uL    Comment: Performed at [facility], [address] [place], [place] [address]  Basic metabolic panel     Status: Abnormal   Collection Time: 10/08/17  1:53 PM  Result Value Ref Range   Sodium 139 135 - 145 mmol/L   Potassium 4.7 3.5 - 5.1 mmol/L   Chloride 110 101 - 111 mmol/L   CO2 22 22 - 32 mmol/L   Glucose, Bld 154 (H) 65 - 99 mg/dL   BUN 19 6 - 20 mg/dL   Creatinine, Ser 1.28 (H) 0.61 - 1.24 mg/dL   Calcium 9.1 8.9 - 10.3 mg/dL   GFR calc non Af Amer 56 (L) >60 mL/min   GFR calc Af Amer >60 >60 mL/min    Comment: (NOTE) The eGFR has been calculated using the CKD EPI equation. This calculation has not been validated in all clinical situations. eGFR's persistently <60 mL/min signify possible Chronic Kidney Disease.    Anion gap 7 5 - 15    Comment:  Performed at [facility], [address] [place], [place] [address]  Glucose, capillary     Status: Abnormal   Collection Time: 10/27/17 12:50 PM  Result Value Ref Range   Glucose-Capillary 102 (H) 70 - 99 mg/dL  Surgical pathology     Status: None   Collection Time: 10/27/17  2:21 PM  Result Value Ref Range   SURGICAL PATHOLOGY      Surgical Pathology CASE: [id number] PATIENT: Tomasa Blase Surgical Pathology Report     SPECIMEN SUBMITTED: A. Colon polyp, ascending; cbx B. Colon polyp, ascending; cold snare  CLINICAL HISTORY: None provided  PRE-OPERATIVE DIAGNOSIS: ABD pain, gastritis, PH polyps  POST-OPERATIVE DIAGNOSIS: Portal venous HTN, colon polyps     DIAGNOSIS: A.  COLON POLYP, ASCENDING; COLD BIOPSY: - TUBULAR ADENOMA. - NEGATIVE FOR HIGH-GRADE DYSPLASIA AND MALIGNANCY.  B.  COLON POLYP, ASCENDING; COLD SNARE: - TUBULAR ADENOMA. - NEGATIVE FOR HIGH-GRADE DYSPLASIA AND MALIGNANCY.   GROSS DESCRIPTION: A. Labeled: Cbx ascending colon polyp Received: In formalin Tissue fragment(s): 1 Size: 0.4 cm Description: Tan fragment Entirely submitted in one cassette.  B. Labeled: Cold snare ascending colon polyp Received: In formalin Tissue fragment(s): 1 Size: 0.5 cm Description: Tan fragment Entirely submitted in one cassette.   Final Diagnosis performed by Idamae Lusher, MD.   Electronically signed 10/29/2017 12:43:41PM The electronic signature indicates that the named Attending Pathologist has evaluated the specimen  Technical component performed at [facility], [address], [place], [place] [address] Lab: [phone] Dir: Rush Farmer, MD, MMM  Professional component performed at [facility], [facility], [place], [place], [place] [address] Lab: [phone] Dir: Dellia Nims. Reuel Derby, MD   Vitamin B12  Status: None   Collection Time: 11/10/17 10:02 AM  Result Value Ref Range   Vitamin B-12 907 180 - 914 pg/mL     Comment: (NOTE) This assay is not validated for testing neonatal or myeloproliferative syndrome specimens for Vitamin B12 levels. Performed at [facility], [place] [address], [place], [place] [address]   Folate     Status: Abnormal   Collection Time: 11/10/17 10:02 AM  Result Value Ref Range   Folate 5.1 (L) >5.9 ng/mL    Comment: Performed at [facility], [address] [place], [place] [address]  Comprehensive metabolic panel     Status: Abnormal   Collection Time: 11/10/17 10:02 AM  Result Value Ref Range   Sodium 142 135 - 145 mmol/L   Potassium 4.6 3.5 - 5.1 mmol/L   Chloride 106 98 - 111 mmol/L    Comment: Please note change in reference range.   CO2 26 22 - 32 mmol/L   Glucose, Bld 106 (H) 70 - 99 mg/dL    Comment: Please note change in reference range.   BUN 17 8 - 23 mg/dL    Comment: Please note change in reference range.   Creatinine, Ser 1.32 (H) 0.61 - 1.24 mg/dL   Calcium 8.8 (L) 8.9 - 10.3 mg/dL   Total Protein 7.0 6.5 - 8.1 g/dL   Albumin 4.0 3.5 - 5.0 g/dL   AST 16 15 - 41 U/L   ALT 13 0 - 44 U/L    Comment: Please note change in reference range.   Alkaline Phosphatase 90 38 - 126 U/L   Total Bilirubin 0.7 0.3 - 1.2 mg/dL   GFR calc non Af Amer 54 (L) >60 mL/min   GFR calc Af Amer >60 >60 mL/min    Comment: (NOTE) The eGFR has been calculated using the CKD EPI equation. This calculation has not been validated in all clinical situations. eGFR's persistently <60 mL/min signify possible Chronic Kidney Disease.    Anion gap 10 5 - 15    Comment: Performed at [facility], [address] [place], [place] [address]  CBC with Differential/Platelet     Status: Abnormal   Collection Time: 11/10/17 10:02 AM  Result Value Ref Range   WBC 8.1 3.8 - 10.6 K/uL   RBC 2.80 (L) 4.40 - 5.90 MIL/uL   Hemoglobin 9.2 (L) 13.0 - 18.0 g/dL   HCT 27.9 (L) 40.0 - 52.0 %   MCV 99.6 80.0 - 100.0 fL   MCH 32.7 26.0 - 34.0 pg   MCHC 32.8 32.0 - 36.0  g/dL   RDW 19.0 (H) 11.5 - 14.5 %   Platelets 98 (L) 150 - 440 K/uL   Neutrophils Relative % 52 %   Neutro Abs 4.2 1.4 - 6.5 K/uL   Lymphocytes Relative 33 %   Lymphs Abs 2.7 1.0 - 3.6 K/uL   Monocytes Relative 12 %   Monocytes Absolute 1.0 0.2 - 1.0 K/uL   Eosinophils Relative 1 %   Eosinophils Absolute 0.1 0 - 0.7 K/uL   Basophils Relative 2 %   Basophils Absolute 0.1 0 - 0.1 K/uL    Comment: Performed at [facility], [address], [place], [place] [address]    Radiology No results found.  Assessment/Plan Essential hypertension blood pressure control important in reducing the progression of atherosclerotic disease. On appropriate oral medications.   Type 2 diabetes mellitus with complication (HCC) blood glucose control important in reducing the progression of atherosclerotic  disease. Also, involved in wound healing. On appropriate medications.  Atherosclerotic peripheral vascular disease with intermittent claudication (HCC)  His noninvasive studies today demonstrate a right ABI of 1.0 and a left ABI of 0.59 aortoiliac duplex showed an ectatic aorta of about 2.8 cm but not frankly aneurysmal.  No current lifestyle limiting symptoms with only mild claudication.  No intervention will be performed at this time with only mild symptoms.  I will plan to see him back in 6 months with noninvasive studies and if he remains stable and mildly symptomatic, we would likely go to annual visits thereafter.  Continue current medical regimen.    Leotis Pain, MD  12/23/2017 12:02 PM    This note was created with Dragon medical transcription system.  Any errors from dictation are purely unintentional

## 2017-12-23 NOTE — Assessment & Plan Note (Signed)
His noninvasive studies today demonstrate a right ABI of 1.0 and a left ABI of 0.59 aortoiliac duplex showed an ectatic aorta of about 2.8 cm but not frankly aneurysmal.  No current lifestyle limiting symptoms with only mild claudication.  No intervention will be performed at this time with only mild symptoms.  I will plan to see him back in 6 months with noninvasive studies and if he remains stable and mildly symptomatic, we would likely go to annual visits thereafter.  Continue current medical regimen.

## 2018-01-08 MED FILL — IMBRUVICA 280 MG TAB: 280 | 28 days supply | Qty: 28 | Fill #1

## 2018-01-12 ENCOUNTER — Other Ambulatory Visit: Payer: Self-pay

## 2018-01-12 ENCOUNTER — Other Ambulatory Visit: Payer: Self-pay | Admitting: *Deleted

## 2018-01-12 ENCOUNTER — Encounter: Payer: Self-pay | Admitting: Internal Medicine

## 2018-01-12 ENCOUNTER — Inpatient Hospital Stay (HOSPITAL_BASED_OUTPATIENT_CLINIC_OR_DEPARTMENT_OTHER): Payer: Medicare Other | Admitting: Internal Medicine

## 2018-01-12 ENCOUNTER — Inpatient Hospital Stay: Payer: Medicare Other | Attending: Internal Medicine

## 2018-01-12 VITALS — BP 148/75 | HR 56 | Temp 97.9°F | Resp 18 | Ht 68.0 in | Wt 173.5 lb

## 2018-01-12 DIAGNOSIS — C9192 Lymphoid leukemia, unspecified, in relapse: Secondary | ICD-10-CM | POA: Diagnosis present

## 2018-01-12 DIAGNOSIS — C9112 Chronic lymphocytic leukemia of B-cell type in relapse: Secondary | ICD-10-CM

## 2018-01-12 DIAGNOSIS — G8929 Other chronic pain: Secondary | ICD-10-CM | POA: Insufficient documentation

## 2018-01-12 DIAGNOSIS — D649 Anemia, unspecified: Secondary | ICD-10-CM | POA: Insufficient documentation

## 2018-01-12 DIAGNOSIS — E119 Type 2 diabetes mellitus without complications: Secondary | ICD-10-CM | POA: Diagnosis not present

## 2018-01-12 DIAGNOSIS — C8300 Small cell B-cell lymphoma, unspecified site: Secondary | ICD-10-CM

## 2018-01-12 DIAGNOSIS — I10 Essential (primary) hypertension: Secondary | ICD-10-CM

## 2018-01-12 DIAGNOSIS — R350 Frequency of micturition: Secondary | ICD-10-CM | POA: Insufficient documentation

## 2018-01-12 DIAGNOSIS — R3915 Urgency of urination: Secondary | ICD-10-CM | POA: Insufficient documentation

## 2018-01-12 DIAGNOSIS — R630 Anorexia: Secondary | ICD-10-CM | POA: Diagnosis not present

## 2018-01-12 DIAGNOSIS — R1013 Epigastric pain: Secondary | ICD-10-CM | POA: Diagnosis not present

## 2018-01-12 DIAGNOSIS — R634 Abnormal weight loss: Secondary | ICD-10-CM | POA: Diagnosis not present

## 2018-01-12 DIAGNOSIS — F17210 Nicotine dependence, cigarettes, uncomplicated: Secondary | ICD-10-CM

## 2018-01-12 LAB — IRON AND TIBC
Iron: 100 ug/dL (ref 45–182)
Saturation Ratios: 29 % (ref 17.9–39.5)
TIBC: 344 ug/dL (ref 250–450)
UIBC: 244 ug/dL

## 2018-01-12 LAB — COMPREHENSIVE METABOLIC PANEL
ALT: 11 U/L (ref 0–44)
ANION GAP: 8 (ref 5–15)
AST: 13 U/L — ABNORMAL LOW (ref 15–41)
Albumin: 3.7 g/dL (ref 3.5–5.0)
Alkaline Phosphatase: 84 U/L (ref 38–126)
BUN: 25 mg/dL — AB (ref 8–23)
CALCIUM: 8.5 mg/dL — AB (ref 8.9–10.3)
CO2: 20 mmol/L — AB (ref 22–32)
CREATININE: 1.34 mg/dL — AB (ref 0.61–1.24)
Chloride: 112 mmol/L — ABNORMAL HIGH (ref 98–111)
GFR calc non Af Amer: 53 mL/min — ABNORMAL LOW (ref >60)
Glucose, Bld: 127 mg/dL — ABNORMAL HIGH (ref 70–99)
POTASSIUM: 4.3 mmol/L (ref 3.5–5.1)
SODIUM: 140 mmol/L (ref 135–145)
Total Bilirubin: 0.3 mg/dL (ref 0.3–1.2)
Total Protein: 6.7 g/dL (ref 6.5–8.1)

## 2018-01-12 LAB — CBC WITH DIFFERENTIAL/PLATELET
BASOS PCT: 0 %
Basophils Absolute: 0 10*3/uL (ref 0–0.1)
EOS ABS: 0.1 10*3/uL (ref 0–0.7)
Eosinophils Relative: 1 %
HCT: 27.4 % — ABNORMAL LOW (ref 40.0–52.0)
Hemoglobin: 8.7 g/dL — ABNORMAL LOW (ref 13.0–18.0)
Lymphocytes Relative: 33 %
Lymphs Abs: 3.1 10*3/uL (ref 1.0–3.6)
MCH: 32 pg (ref 26.0–34.0)
MCHC: 31.9 g/dL — AB (ref 32.0–36.0)
MCV: 100.4 fL — ABNORMAL HIGH (ref 80.0–100.0)
MONOS PCT: 8 %
Monocytes Absolute: 0.8 10*3/uL (ref 0.2–1.0)
Neutro Abs: 5.3 10*3/uL (ref 1.4–6.5)
Neutrophils Relative %: 58 %
Platelets: 123 10*3/uL — ABNORMAL LOW (ref 150–440)
RBC: 2.73 MIL/uL — ABNORMAL LOW (ref 4.40–5.90)
RDW: 17.9 % — AB (ref 11.5–14.5)
WBC: 9.3 10*3/uL (ref 3.8–10.6)

## 2018-01-12 LAB — FERRITIN: FERRITIN: 50 ng/mL (ref 24–336)

## 2018-01-12 LAB — LACTATE DEHYDROGENASE: LDH: 111 U/L (ref 98–192)

## 2018-01-12 MED ORDER — OXYCODONE-ACETAMINOPHEN 5-325 MG PO TABS: ORAL_TABLET | ORAL | 0 refills | Status: DC

## 2018-01-12 NOTE — Progress Notes (Signed)
[facility] OFFICE PROGRESS NOTE  Patient Care Team: [name], MD as PCP - General (Internal Medicine)  Cancer Staging No matching staging information was found for the patient.   Oncology History   # 2006- CLL STAGE IV; MAY 2011- WBC- 57K;Platelets-99;Hb-12/CT Bulky LN; BMBx- 80% Invol; del 11; START Benda-Ritux x4 [finished Sep 2011];   # July 2015-Progression; Sep 2015-START ibrutinib; CT scan DEC 2015- Improvement LN; Cont Ibrutinib 140mg /d; NOV 2016 CT- 1-2CM LN [mild progression compared to Dec 2015];NOV 2016- FISH peripheral blood- NO MUTATIONS/CD-38 Positive; NOV 7th- CONT IBRUTINIB 2 pills/day; CT AUG 2017- STABLE;  DEC 6th PET- Mild RP LN/ Retrocrural LN   SA skin infection [Oct 2016] s/p clinda -------------------------------------------------------    DIAGNOSIS: CLL  STAGE:   IV      ;GOALS: palliative  CURRENT/MOST RECENT THERAPY : Ibrutinib      CLL (chronic lymphoid leukemia) in relapse (HCC)      INTERVAL HISTORY:  James [name] 67 y.o.  male pleasant patient above history of CLL and chronic abdominal pain of unclear etiology is here for follow-up.  Patient is currently on ibrutinib 280 mg a day.   Patient complains of worsening appetite.  Continues to have weight loss.  Feels poorly.  Continues to have chronic abdominal pain for which he is on Percocet.  But no nausea no vomiting.   Review of Systems  Constitutional: Positive for malaise/fatigue and weight loss. Negative for chills, diaphoresis and fever.  HENT: Negative for nosebleeds and sore throat.   Eyes: Negative for double vision.  Respiratory: Negative for cough, hemoptysis, sputum production, shortness of breath and wheezing.   Cardiovascular: Negative for chest pain, palpitations, orthopnea and leg swelling.  Gastrointestinal: Positive for abdominal pain and nausea. Negative for blood in stool, constipation, diarrhea, heartburn, melena and vomiting.  Genitourinary: Negative  for dysuria, frequency and urgency.  Skin: Negative.  Negative for itching and rash.  Neurological: Negative for dizziness, tingling, focal weakness, weakness and headaches.  Endo/Heme/Allergies: Does not bruise/bleed easily.  Psychiatric/Behavioral: Negative for depression. The patient is not nervous/anxious and does not have insomnia.       PAST MEDICAL HISTORY :  Past Medical History:  Diagnosis Date  . CLL (chronic lymphocytic leukemia) ([place])   . Diabetes mellitus without complication ([place])   . Hematuria   . Hypertension     PAST SURGICAL HISTORY :   Past Surgical History:  Procedure Laterality Date  . CATARACT EXTRACTION W/ INTRAOCULAR LENS IMPLANT Bilateral   . CHOLECYSTECTOMY  1983  . COLONOSCOPY WITH PROPOFOL N/A 10/27/2017   Procedure: COLONOSCOPY WITH PROPOFOL;  Surgeon: Manya Silvas, MD;  Location: [facility] ENDOSCOPY;  Service: Endoscopy;  Laterality: N/A;  . ESOPHAGOGASTRODUODENOSCOPY (EGD) WITH PROPOFOL N/A 11/20/2016   Procedure: ESOPHAGOGASTRODUODENOSCOPY (EGD) WITH PROPOFOL;  Surgeon: Manya Silvas, MD;  Location: [facility] ENDOSCOPY;  Service: Endoscopy;  Laterality: N/A;  . ESOPHAGOGASTRODUODENOSCOPY (EGD) WITH PROPOFOL N/A 10/27/2017   Procedure: ESOPHAGOGASTRODUODENOSCOPY (EGD) WITH PROPOFOL;  Surgeon: Manya Silvas, MD;  Location: [facility] ENDOSCOPY;  Service: Endoscopy;  Laterality: N/A;  . LOWER EXTREMITY ANGIOGRAPHY Right 05/19/2017   Procedure: LOWER EXTREMITY ANGIOGRAPHY;  Surgeon: Algernon Huxley, MD;  Location: [facility];  Service: Cardiovascular;  Laterality: Right;    FAMILY HISTORY :   Family History  Problem Relation Age of Onset  . Hypertension Sister     SOCIAL HISTORY:   Social History   Tobacco Use  . Smoking status: Current Every Day Smoker  Packs/day: 0.50    Years: 47.00    Pack years: 23.50    Types: Cigarettes  . Smokeless tobacco: Never Used  Substance Use Topics  . Alcohol use: Yes    Alcohol/week: 1.0 standard drinks     Types: 1 Glasses of wine per week    Comment:  almost none in last 6 months  . Drug use: No    ALLERGIES:  has No Known Allergies.  MEDICATIONS:  Current Outpatient Medications  Medication Sig Dispense Refill  . amLODipine (NORVASC) 10 MG tablet Take 1 tablet (10 mg total) by mouth daily. 30 tablet 3  . aspirin EC 81 MG tablet Take 81 mg by mouth daily.    Marland Kitchen atorvastatin (LIPITOR) 10 MG tablet Take 1 tablet (10 mg total) by mouth daily. 30 tablet 11  . clopidogrel (PLAVIX) 75 MG tablet Take 1 tablet (75 mg total) by mouth daily. 30 tablet 11  . IMBRUVICA 280 MG TABS TAKE 1 TABLET BY MOUTH DAILY. 28 tablet 6  . iron polysaccharides (NU-IRON) 150 MG capsule Take 150 mg by mouth daily.     Marland Kitchen lisinopril (PRINIVIL,ZESTRIL) 30 MG tablet Take 30 mg by mouth daily.  1  . metFORMIN (GLUMETZA) 1000 MG (MOD) 24 hr tablet Take 1,000 mg by mouth 2 (two) times daily.    . metoprolol tartrate (LOPRESSOR) 25 MG tablet TAKE 1 TABLET BY MOUTH TWICE DAILY 180 tablet 1  . omeprazole (PRILOSEC) 40 MG capsule Take 40 mg by mouth daily.     Marland Kitchen oxyCODONE-acetaminophen (PERCOCET/ROXICET) 5-325 MG tablet 1 pill every 8-12 hours 60 tablet 0   No current facility-administered medications for this visit.     PHYSICAL EXAMINATION: ECOG PERFORMANCE STATUS: 1 - Symptomatic but completely ambulatory  BP (!) 148/75   Pulse (!) 56   Temp 97.9 F (36.6 C) (Tympanic)   Resp 18   Ht 5\' 8"  (1.727 m)   Wt 173 lb 8 oz (78.7 kg)   BMI 26.38 kg/m   Filed Weights   01/12/18 1003  Weight: 173 lb 8 oz (78.7 kg)    Physical Exam  Constitutional: He is oriented to person, place, and time and well-developed, well-nourished, and in no distress.  HENT:  Head: Normocephalic and atraumatic.  Mouth/Throat: Oropharynx is clear and moist. No oropharyngeal exudate.  Eyes: Pupils are equal, round, and reactive to light.  Neck: Normal range of motion. Neck supple.  Cardiovascular: Normal rate and regular rhythm.   Pulmonary/Chest: No respiratory distress. He has no wheezes.  Decreased air entry bil.   Abdominal: Soft. Bowel sounds are normal. He exhibits no distension and no mass. There is no tenderness. There is no rebound and no guarding.  ? Positive for splenomegaly.   Musculoskeletal: Normal range of motion. He exhibits no edema or tenderness.  Neurological: He is alert and oriented to person, place, and time.  Skin: Skin is warm.  Psychiatric: Affect normal.      LABORATORY DATA:  I have reviewed the data as listed    Component Value Date/Time   NA 140 01/12/2018 0918   NA 142 05/03/2014 1139   K 4.3 01/12/2018 0918   K 4.7 05/03/2014 1139   CL 112 (H) 01/12/2018 0918   CL 110 (H) 05/03/2014 1139   CO2 20 (L) 01/12/2018 0918   CO2 24 05/03/2014 1139   GLUCOSE 127 (H) 01/12/2018 0918   GLUCOSE 98 05/03/2014 1139   BUN 25 (H) 01/12/2018 0918   BUN 20 (H) 05/03/2014  1139   CREATININE 1.34 (H) 01/12/2018 0918   CREATININE 1.22 08/09/2014 1122   CALCIUM 8.5 (L) 01/12/2018 0918   CALCIUM 8.6 05/03/2014 1139   PROT 6.7 01/12/2018 0918   PROT 7.5 05/03/2014 1139   ALBUMIN 3.7 01/12/2018 0918   ALBUMIN 4.1 05/03/2014 1139   AST 13 (L) 01/12/2018 0918   AST 15 05/03/2014 1139   ALT 11 01/12/2018 0918   ALT 26 05/03/2014 1139   ALKPHOS 84 01/12/2018 0918   ALKPHOS 94 05/03/2014 1139   BILITOT 0.3 01/12/2018 0918   BILITOT 0.5 05/03/2014 1139   GFRNONAA 53 (L) 01/12/2018 0918   GFRNONAA >60 08/09/2014 1122   GFRAA >60 01/12/2018 0918   GFRAA >60 08/09/2014 1122    No results found for: SPEP, UPEP  Lab Results  Component Value Date   WBC 9.3 01/12/2018   NEUTROABS 5.3 01/12/2018   HGB 8.7 (L) 01/12/2018   HCT 27.4 (L) 01/12/2018   MCV 100.4 (H) 01/12/2018   PLT 123 (L) 01/12/2018      Chemistry      Component Value Date/Time   NA 140 01/12/2018 0918   NA 142 05/03/2014 1139   K 4.3 01/12/2018 0918   K 4.7 05/03/2014 1139   CL 112 (H) 01/12/2018 0918   CL 110 (H)  05/03/2014 1139   CO2 20 (L) 01/12/2018 0918   CO2 24 05/03/2014 1139   BUN 25 (H) 01/12/2018 0918   BUN 20 (H) 05/03/2014 1139   CREATININE 1.34 (H) 01/12/2018 0918   CREATININE 1.22 08/09/2014 1122      Component Value Date/Time   CALCIUM 8.5 (L) 01/12/2018 0918   CALCIUM 8.6 05/03/2014 1139   ALKPHOS 84 01/12/2018 0918   ALKPHOS 94 05/03/2014 1139   AST 13 (L) 01/12/2018 0918   AST 15 05/03/2014 1139   ALT 11 01/12/2018 0918   ALT 26 05/03/2014 1139   BILITOT 0.3 01/12/2018 0918   BILITOT 0.5 05/03/2014 1139       RADIOGRAPHIC STUDIES: I have personally reviewed the radiological images as listed and agreed with the findings in the report. No results found.   ASSESSMENT & PLAN:  CLL (chronic lymphoid leukemia) in relapse ([place]) # CLL/SLL- relapsed currently on second line therapy with ibrutinib September 2016.  March 2019-CT scan shows no progressive/worsening lymphadenopathy; however clinically worse.   # recommend CT C/A/P asap; if progression of disease noted; would recommend Venaoclax based therapy. for Continue ibrutinib at 280 mg/day.   # Anemia-hemoglobin8.9; worse; ? etiology CKD vs worsening CLL; Check iron studies;ferritin; await above CT scans.   #Abdominal pain/dyspepsia/unclear etiology; not improving.  S/p EGD/colonoscopy- no reason for pain found.  Continue Percocet; new refill given.   #Elevated blood pressure-secondary to ibrutinib; STABLE.   # CT C/A/P asap; follow up 1-2 days later;      Orders Placed This Encounter  Procedures  . CT CHEST W CONTRAST    Standing Status:   Future    Standing Expiration Date:   01/13/2019    Order Specific Question:   If indicated for the ordered procedure, I authorize the administration of contrast media per Radiology protocol    Answer:   Yes    Order Specific Question:   Preferred imaging location?    Answer:   [facility]    Order Specific Question:   Radiology Contrast Protocol - do NOT remove file  path    Answer:   \\[email or web address]    Order Specific Question:   **  REASON FOR EXAM (FREE TEXT)    Answer:   CLL on treatment  . CT Abdomen Pelvis W Contrast    Standing Status:   Future    Standing Expiration Date:   01/12/2019    Order Specific Question:   ** REASON FOR EXAM (FREE TEXT)    Answer:   CLL on treatment    Order Specific Question:   If indicated for the ordered procedure, I authorize the administration of contrast media per Radiology protocol    Answer:   Yes    Order Specific Question:   Preferred imaging location?    Answer:   [facility]    Order Specific Question:   Is Oral Contrast requested for this exam?    Answer:   Yes, Per Radiology protocol    Order Specific Question:   Radiology Contrast Protocol - do NOT remove file path    Answer:   \\[email or web address]  . Iron and TIBC    Standing Status:   Future    Number of Occurrences:   1    Standing Expiration Date:   01/13/2019  . Ferritin    Standing Status:   Future    Number of Occurrences:   1    Standing Expiration Date:   01/13/2019  . Lactate dehydrogenase    Standing Status:   Future    Number of Occurrences:   1    Standing Expiration Date:   01/13/2019   All questions were answered. The patient knows to call the clinic with any problems, questions or concerns.      Cammie Sickle, MD 01/12/2018 12:06 PM

## 2018-01-12 NOTE — Assessment & Plan Note (Addendum)
#   CLL/SLL- relapsed currently on second line therapy with ibrutinib September 2016.  March 2019-CT scan shows no progressive/worsening lymphadenopathy; however clinically worse.   # recommend CT C/A/P asap; if progression of disease noted; would recommend Venaoclax based therapy. for Continue ibrutinib at 280 mg/day.   # Anemia-hemoglobin8.9; worse; ? etiology CKD vs worsening CLL; Check iron studies;ferritin; await above CT scans.   #Abdominal pain/dyspepsia/unclear etiology; not improving.  S/p EGD/colonoscopy- no reason for pain found.  Continue Percocet; new refill given.   #Elevated blood pressure-secondary to ibrutinib; STABLE.   # CT C/A/P asap; follow up 1-2 days later;

## 2018-01-20 ENCOUNTER — Ambulatory Visit
Admission: RE | Admit: 2018-01-20 | Discharge: 2018-01-20 | Disposition: A | Discharge status: Home / Self Care | Payer: Medicare Other | Source: Ambulatory Visit | Attending: Internal Medicine | Admitting: Internal Medicine

## 2018-01-20 DIAGNOSIS — C9192 Lymphoid leukemia, unspecified, in relapse: Secondary | ICD-10-CM | POA: Diagnosis not present

## 2018-01-20 DIAGNOSIS — C9112 Chronic lymphocytic leukemia of B-cell type in relapse: Secondary | ICD-10-CM

## 2018-01-20 MED ORDER — IOPAMIDOL (ISOVUE-300) INJECTION 61%
100.0000 mL | Freq: Once | INTRAVENOUS | Status: AC | PRN
  Administered 2018-01-20: 100 mL via INTRAVENOUS

## 2018-01-22 ENCOUNTER — Inpatient Hospital Stay (HOSPITAL_BASED_OUTPATIENT_CLINIC_OR_DEPARTMENT_OTHER): Payer: Medicare Other | Admitting: Internal Medicine

## 2018-01-22 VITALS — BP 125/66 | HR 53 | Temp 97.7°F | Resp 16 | Wt 170.0 lb

## 2018-01-22 DIAGNOSIS — F17210 Nicotine dependence, cigarettes, uncomplicated: Secondary | ICD-10-CM

## 2018-01-22 DIAGNOSIS — R634 Abnormal weight loss: Secondary | ICD-10-CM

## 2018-01-22 DIAGNOSIS — R350 Frequency of micturition: Secondary | ICD-10-CM | POA: Diagnosis not present

## 2018-01-22 DIAGNOSIS — C9112 Chronic lymphocytic leukemia of B-cell type in relapse: Secondary | ICD-10-CM

## 2018-01-22 DIAGNOSIS — C9192 Lymphoid leukemia, unspecified, in relapse: Secondary | ICD-10-CM | POA: Diagnosis not present

## 2018-01-22 DIAGNOSIS — G8929 Other chronic pain: Secondary | ICD-10-CM

## 2018-01-22 DIAGNOSIS — I10 Essential (primary) hypertension: Secondary | ICD-10-CM

## 2018-01-22 DIAGNOSIS — E119 Type 2 diabetes mellitus without complications: Secondary | ICD-10-CM

## 2018-01-22 DIAGNOSIS — D630 Anemia in neoplastic disease: Secondary | ICD-10-CM

## 2018-01-22 DIAGNOSIS — R1013 Epigastric pain: Secondary | ICD-10-CM | POA: Diagnosis not present

## 2018-01-22 DIAGNOSIS — R319 Hematuria, unspecified: Secondary | ICD-10-CM

## 2018-01-22 DIAGNOSIS — R3915 Urgency of urination: Secondary | ICD-10-CM

## 2018-01-22 LAB — URINALYSIS, COMPLETE (UACMP) WITH MICROSCOPIC
BACTERIA UA: NONE SEEN
BILIRUBIN URINE: NEGATIVE
Glucose, UA: NEGATIVE mg/dL
KETONES UR: NEGATIVE mg/dL
NITRITE: POSITIVE — AB
PH: 5 (ref 5.0–8.0)
PROTEIN: 100 mg/dL — AB
Specific Gravity, Urine: 1.017 (ref 1.005–1.030)
Squamous Epithelial / LPF: NONE SEEN (ref 0–5)
WBC, UA: >50 WBC/hpf — ABNORMAL HIGH (ref 0–5)

## 2018-01-22 NOTE — Assessment & Plan Note (Addendum)
#  CLL/SLL- relapsed currently on second line therapy with ibrutinib September 2016.  SEP 24th-CT scan shows no progressive/worsening lymphadenopathy; however clinically worse [continue abdominal pain/weight loss/anemia]  #Continue ibrutinib for now; recommend check a bone marrow biopsy for further evaluation of anemia.  # Anemia-hemoglobin8.9; worse; ? etiology CKD vs worsening CLL; CT scan unrevealing [see above]' await bone marrow biopsy.  # Abdominal pain/dyspepsia/unclear etiology; not improving.  S/p EGD/colonoscopy- no reason for pain found.  Continue Percocet; new refill given.  Check urine drug screen at next visit.  # Increased frequency of urination/urgency; check UA. Start Flomax.   # BMBx in 2 weeks/ follow up with me 1 week later.   Addendum: UA positive for infection recommend-ciprofloxacin 500 mg twice daily for 5 days.  Also check urine drug screen at next visit.

## 2018-01-22 NOTE — Progress Notes (Signed)
[place] OFFICE PROGRESS NOTE  Patient Care Team: [name], MD as PCP - General (Internal Medicine)  Cancer Staging No matching staging information was found for the patient.   Oncology History   # 2006- CLL STAGE IV; MAY 2011- WBC- 57K;Platelets-99;Hb-12/CT Bulky LN; BMBx- 80% Invol; del 11; START Benda-Ritux x4 [finished Sep 2011];   # July 2015-Progression; Sep 2015-START ibrutinib; CT scan DEC 2015- Improvement LN; Cont Ibrutinib '140mg'$ /d; NOV 2016 CT- 1-2CM LN [mild progression compared to Dec 2015];NOV 2016- FISH peripheral blood- NO MUTATIONS/CD-38 Positive; NOV 7th- CONT IBRUTINIB 2 pills/day; CT AUG 2017- STABLE;  DEC 6th PET- Mild RP LN/ Retrocrural LN   SA skin infection [Oct 2016] s/p clinda -------------------------------------------------------    DIAGNOSIS: CLL  STAGE:   IV      ;GOALS: palliative  CURRENT/MOST RECENT THERAPY : Ibrutinib      CLL (chronic lymphoid leukemia) in relapse (HCC)      INTERVAL HISTORY:  James [name] 67 y.o.  male pleasant patient above history of CLL and chronic abdominal pain of unclear etiology is here for follow-up/review the results of CAT scan.  Patient continues to have abdominal pain not to bring.  Complains of poor appetite.  Continues to lose weight.  He continues to been Percocet for abdominal pain.  Positive for nausea.  No vomiting.    Review of Systems  Constitutional: Positive for malaise/fatigue and weight loss. Negative for chills, diaphoresis and fever.  HENT: Negative for nosebleeds and sore throat.   Eyes: Negative for double vision.  Respiratory: Negative for cough, hemoptysis, sputum production, shortness of breath and wheezing.   Cardiovascular: Negative for chest pain, palpitations, orthopnea and leg swelling.  Gastrointestinal: Positive for abdominal pain and nausea. Negative for blood in stool, constipation, diarrhea, heartburn, melena and vomiting.  Genitourinary: Negative for  dysuria, frequency and urgency.  Skin: Negative.  Negative for itching and rash.  Neurological: Negative for dizziness, tingling, focal weakness, weakness and headaches.  Endo/Heme/Allergies: Does not bruise/bleed easily.  Psychiatric/Behavioral: Negative for depression. The patient is not nervous/anxious and does not have insomnia.       PAST MEDICAL HISTORY :  Past Medical History:  Diagnosis Date  . CLL (chronic lymphocytic leukemia) ([place])   . Diabetes mellitus without complication ([place])   . Hematuria   . Hypertension     PAST SURGICAL HISTORY :   Past Surgical History:  Procedure Laterality Date  . CATARACT EXTRACTION W/ INTRAOCULAR LENS IMPLANT Bilateral   . CHOLECYSTECTOMY  1983  . COLONOSCOPY WITH PROPOFOL N/A 10/27/2017   Procedure: COLONOSCOPY WITH PROPOFOL;  Surgeon: Manya Silvas, MD;  Location: [facility] ENDOSCOPY;  Service: Endoscopy;  Laterality: N/A;  . ESOPHAGOGASTRODUODENOSCOPY (EGD) WITH PROPOFOL N/A 11/20/2016   Procedure: ESOPHAGOGASTRODUODENOSCOPY (EGD) WITH PROPOFOL;  Surgeon: Manya Silvas, MD;  Location: [facility] ENDOSCOPY;  Service: Endoscopy;  Laterality: N/A;  . ESOPHAGOGASTRODUODENOSCOPY (EGD) WITH PROPOFOL N/A 10/27/2017   Procedure: ESOPHAGOGASTRODUODENOSCOPY (EGD) WITH PROPOFOL;  Surgeon: Manya Silvas, MD;  Location: [facility] ENDOSCOPY;  Service: Endoscopy;  Laterality: N/A;  . LOWER EXTREMITY ANGIOGRAPHY Right 05/19/2017   Procedure: LOWER EXTREMITY ANGIOGRAPHY;  Surgeon: Algernon Huxley, MD;  Location: [place] [facility];  Service: Cardiovascular;  Laterality: Right;    FAMILY HISTORY :   Family History  Problem Relation Age of Onset  . Hypertension Sister     SOCIAL HISTORY:   Social History   Tobacco Use  . Smoking status: Current Every Day Smoker    Packs/day:  0.50    Years: 47.00    Pack years: 23.50    Types: Cigarettes  . Smokeless tobacco: Never Used  Substance Use Topics  . Alcohol use: Yes    Alcohol/week: 1.0 standard drinks     Types: 1 Glasses of wine per week    Comment:  almost none in last 6 months  . Drug use: No    ALLERGIES:  has No Known Allergies.  MEDICATIONS:  Current Outpatient Medications  Medication Sig Dispense Refill  . amLODipine (NORVASC) 10 MG tablet Take 1 tablet (10 mg total) by mouth daily. 30 tablet 3  . aspirin EC 81 MG tablet Take 81 mg by mouth daily.    Marland Kitchen atorvastatin (LIPITOR) 10 MG tablet Take 1 tablet (10 mg total) by mouth daily. 30 tablet 11  . IMBRUVICA 280 MG TABS TAKE 1 TABLET BY MOUTH DAILY. 28 tablet 6  . iron polysaccharides (NU-IRON) 150 MG capsule Take 150 mg by mouth daily.     Marland Kitchen lisinopril (PRINIVIL,ZESTRIL) 30 MG tablet Take 30 mg by mouth daily.  1  . metFORMIN (GLUMETZA) 1000 MG (MOD) 24 hr tablet Take 1,000 mg by mouth 2 (two) times daily.    Marland Kitchen oxyCODONE-acetaminophen (PERCOCET/ROXICET) 5-325 MG tablet 1 pill every 8-12 hours 60 tablet 0  . clopidogrel (PLAVIX) 75 MG tablet Take 1 tablet (75 mg total) by mouth daily. (Patient not taking: Reported on 01/22/2018) 30 tablet 11  . metoprolol tartrate (LOPRESSOR) 25 MG tablet TAKE 1 TABLET BY MOUTH TWICE DAILY (Patient not taking: Reported on 01/22/2018) 180 tablet 1  . omeprazole (PRILOSEC) 40 MG capsule Take 40 mg by mouth daily.      No current facility-administered medications for this visit.     PHYSICAL EXAMINATION: ECOG PERFORMANCE STATUS: 1 - Symptomatic but completely ambulatory  BP 125/66 (BP Location: Left Arm, Patient Position: Sitting)   Pulse (!) 53   Temp 97.7 F (36.5 C) (Tympanic)   Resp 16   Wt 170 lb (77.1 kg)   BMI 25.85 kg/m   Filed Weights   01/22/18 1022  Weight: 170 lb (77.1 kg)    Physical Exam  Constitutional: He is oriented to person, place, and time and well-developed, well-nourished, and in no distress.  HENT:  Head: Normocephalic and atraumatic.  Mouth/Throat: Oropharynx is clear and moist. No oropharyngeal exudate.  Eyes: Pupils are equal, round, and reactive to light.   Neck: Normal range of motion. Neck supple.  Cardiovascular: Normal rate and regular rhythm.  Pulmonary/Chest: No respiratory distress. He has no wheezes.  Decreased air entry bil.   Abdominal: Soft. Bowel sounds are normal. He exhibits no distension and no mass. There is no tenderness. There is no rebound and no guarding.  ? Positive for splenomegaly.   Musculoskeletal: Normal range of motion. He exhibits no edema or tenderness.  Neurological: He is alert and oriented to person, place, and time.  Skin: Skin is warm.  Psychiatric: Affect normal.      LABORATORY DATA:  I have reviewed the data as listed    Component Value Date/Time   NA 140 01/12/2018 0918   NA 142 05/03/2014 1139   K 4.3 01/12/2018 0918   K 4.7 05/03/2014 1139   CL 112 (H) 01/12/2018 0918   CL 110 (H) 05/03/2014 1139   CO2 20 (L) 01/12/2018 0918   CO2 24 05/03/2014 1139   GLUCOSE 127 (H) 01/12/2018 0918   GLUCOSE 98 05/03/2014 1139   BUN 25 (H) 01/12/2018 6384  BUN 20 (H) 05/03/2014 1139   CREATININE 1.34 (H) 01/12/2018 0918   CREATININE 1.22 08/09/2014 1122   CALCIUM 8.5 (L) 01/12/2018 0918   CALCIUM 8.6 05/03/2014 1139   PROT 6.7 01/12/2018 0918   PROT 7.5 05/03/2014 1139   ALBUMIN 3.7 01/12/2018 0918   ALBUMIN 4.1 05/03/2014 1139   AST 13 (L) 01/12/2018 0918   AST 15 05/03/2014 1139   ALT 11 01/12/2018 0918   ALT 26 05/03/2014 1139   ALKPHOS 84 01/12/2018 0918   ALKPHOS 94 05/03/2014 1139   BILITOT 0.3 01/12/2018 0918   BILITOT 0.5 05/03/2014 1139   GFRNONAA 53 (L) 01/12/2018 0918   GFRNONAA >60 08/09/2014 1122   GFRAA >60 01/12/2018 0918   GFRAA >60 08/09/2014 1122    No results found for: SPEP, UPEP  Lab Results  Component Value Date   WBC 9.3 01/12/2018   NEUTROABS 5.3 01/12/2018   HGB 8.7 (L) 01/12/2018   HCT 27.4 (L) 01/12/2018   MCV 100.4 (H) 01/12/2018   PLT 123 (L) 01/12/2018      Chemistry      Component Value Date/Time   NA 140 01/12/2018 0918   NA 142 05/03/2014 1139    K 4.3 01/12/2018 0918   K 4.7 05/03/2014 1139   CL 112 (H) 01/12/2018 0918   CL 110 (H) 05/03/2014 1139   CO2 20 (L) 01/12/2018 0918   CO2 24 05/03/2014 1139   BUN 25 (H) 01/12/2018 0918   BUN 20 (H) 05/03/2014 1139   CREATININE 1.34 (H) 01/12/2018 0918   CREATININE 1.22 08/09/2014 1122      Component Value Date/Time   CALCIUM 8.5 (L) 01/12/2018 0918   CALCIUM 8.6 05/03/2014 1139   ALKPHOS 84 01/12/2018 0918   ALKPHOS 94 05/03/2014 1139   AST 13 (L) 01/12/2018 0918   AST 15 05/03/2014 1139   ALT 11 01/12/2018 0918   ALT 26 05/03/2014 1139   BILITOT 0.3 01/12/2018 0918   BILITOT 0.5 05/03/2014 1139       RADIOGRAPHIC STUDIES: I have personally reviewed the radiological images as listed and agreed with the findings in the report. No results found.   ASSESSMENT & PLAN:  CLL (chronic lymphoid leukemia) in relapse ([place]) # CLL/SLL- relapsed currently on second line therapy with ibrutinib September 2016.  SEP 24th-CT scan shows no progressive/worsening lymphadenopathy; however clinically worse [continue abdominal pain/weight loss/anemia]  #Continue ibrutinib for now; recommend check a bone marrow biopsy for further evaluation of anemia.  # Anemia-hemoglobin8.9; worse; ? etiology CKD vs worsening CLL; CT scan unrevealing [see above]' await bone marrow biopsy.  # Abdominal pain/dyspepsia/unclear etiology; not improving.  S/p EGD/colonoscopy- no reason for pain found.  Continue Percocet; new refill given.  Check urine drug screen at next visit.  # Increased frequency of urination/urgency; check UA. Start Flomax.   # BMBx in 2 weeks/ follow up with me 1 week later.     Orders Placed This Encounter  Procedures  . CT BONE MARROW BIOPSY & ASPIRATION    Comprehensive morphologic evaluation of bone marrow biopsy/ aspirate; flow cytometry; cytogenetics; FISH- as deemed appropriate by the pathologist. Please call me- if any question- at [phone].    Standing Status:   Future     Standing Expiration Date:   04/24/2019    Order Specific Question:   Reason for Exam (SYMPTOM  OR DIAGNOSIS REQUIRED)    Answer:   CLL    Order Specific Question:   Preferred imaging location?  Answer:   [facility]    Order Specific Question:   Radiology Contrast Protocol - do NOT remove file path    Answer:   [email or web address]  . Urinalysis, Complete w Microscopic   All questions were answered. The patient knows to call the clinic with any problems, questions or concerns.      Cammie Sickle, MD 01/25/2018 7:38 PM

## 2018-01-25 ENCOUNTER — Telehealth: Payer: Self-pay | Admitting: Internal Medicine

## 2018-01-25 DIAGNOSIS — T40605S Adverse effect of unspecified narcotics, sequela: Principal | ICD-10-CM

## 2018-01-25 DIAGNOSIS — IMO0001 Reserved for inherently not codable concepts without codable children: Secondary | ICD-10-CM

## 2018-01-25 MED ORDER — CIPROFLOXACIN HCL 500 MG PO TABS: 500.0000 mg | ORAL_TABLET | Freq: Two times a day (BID) | ORAL | 0 refills | Status: DC

## 2018-01-25 NOTE — Telephone Encounter (Signed)
Please inform patient that I recommend ciprofloxacin for a UTI.  Also schedule a urine test at next visit; I have ordered the test.

## 2018-01-26 ENCOUNTER — Telehealth: Payer: Self-pay | Admitting: *Deleted

## 2018-01-26 ENCOUNTER — Other Ambulatory Visit: Payer: Self-pay | Admitting: *Deleted

## 2018-01-26 DIAGNOSIS — N390 Urinary tract infection, site not specified: Secondary | ICD-10-CM

## 2018-01-26 NOTE — Telephone Encounter (Signed)
Per md - v/o to add u/a as well. Orders entered.

## 2018-01-26 NOTE — Telephone Encounter (Signed)
Bone marrow biopsy scheduled. Patient aware of biopsy apts times (10/14 with arrival at 8 am), npo after midnight. He may take his bp/heart meds with a sip of water only and will need to bring a driver. His f/u apt with Dr. B has been r/s to 10/21 due to result availability.

## 2018-01-26 NOTE — Telephone Encounter (Signed)
Patient has been notified of Dr. Aletha Halim comments and recommendations. Patient verbalized understanding.

## 2018-02-02 ENCOUNTER — Other Ambulatory Visit: Payer: Self-pay | Admitting: *Deleted

## 2018-02-02 ENCOUNTER — Other Ambulatory Visit: Payer: Self-pay | Admitting: Oncology

## 2018-02-02 DIAGNOSIS — C8300 Small cell B-cell lymphoma, unspecified site: Secondary | ICD-10-CM

## 2018-02-02 DIAGNOSIS — C9192 Lymphoid leukemia, unspecified, in relapse: Secondary | ICD-10-CM

## 2018-02-02 DIAGNOSIS — C9112 Chronic lymphocytic leukemia of B-cell type in relapse: Secondary | ICD-10-CM

## 2018-02-02 MED ORDER — OXYCODONE-ACETAMINOPHEN 5-325 MG PO TABS: ORAL_TABLET | ORAL | 0 refills | Status: DC

## 2018-02-02 MED FILL — IMBRUVICA 280 MG TAB: 280 | 28 days supply | Qty: 28 | Fill #2

## 2018-02-02 NOTE — Progress Notes (Signed)
Patient called cancer center requesting refill of Oxycodone 5-325 mg q 8.   [place] Controlled Substance Reporting System reviewed and refill is appropriate on or after 02/01/18. Medication e-scribed to his pharmacy ([place]) using [employer] 2-step verification process.    [place] reviewed:     Faythe Casa, NP 02/02/2018 12:13 PM [phone]

## 2018-02-04 ENCOUNTER — Telehealth: Payer: Self-pay | Admitting: *Deleted

## 2018-02-04 ENCOUNTER — Inpatient Hospital Stay: Payer: Medicare Other | Attending: Internal Medicine

## 2018-02-04 DIAGNOSIS — R634 Abnormal weight loss: Secondary | ICD-10-CM | POA: Diagnosis not present

## 2018-02-04 DIAGNOSIS — T40605S Adverse effect of unspecified narcotics, sequela: Secondary | ICD-10-CM

## 2018-02-04 DIAGNOSIS — I10 Essential (primary) hypertension: Secondary | ICD-10-CM | POA: Diagnosis not present

## 2018-02-04 DIAGNOSIS — D649 Anemia, unspecified: Secondary | ICD-10-CM | POA: Insufficient documentation

## 2018-02-04 DIAGNOSIS — N390 Urinary tract infection, site not specified: Secondary | ICD-10-CM

## 2018-02-04 DIAGNOSIS — IMO0001 Reserved for inherently not codable concepts without codable children: Secondary | ICD-10-CM

## 2018-02-04 DIAGNOSIS — R1084 Generalized abdominal pain: Secondary | ICD-10-CM | POA: Diagnosis not present

## 2018-02-04 DIAGNOSIS — C9192 Lymphoid leukemia, unspecified, in relapse: Secondary | ICD-10-CM | POA: Insufficient documentation

## 2018-02-04 DIAGNOSIS — E119 Type 2 diabetes mellitus without complications: Secondary | ICD-10-CM | POA: Diagnosis not present

## 2018-02-04 DIAGNOSIS — R1013 Epigastric pain: Secondary | ICD-10-CM | POA: Insufficient documentation

## 2018-02-04 DIAGNOSIS — R5383 Other fatigue: Secondary | ICD-10-CM | POA: Diagnosis not present

## 2018-02-04 DIAGNOSIS — G8929 Other chronic pain: Secondary | ICD-10-CM | POA: Insufficient documentation

## 2018-02-04 DIAGNOSIS — F17210 Nicotine dependence, cigarettes, uncomplicated: Secondary | ICD-10-CM | POA: Diagnosis not present

## 2018-02-04 LAB — URINALYSIS, COMPLETE (UACMP) WITH MICROSCOPIC
Bacteria, UA: NONE SEEN
Bilirubin Urine: NEGATIVE
GLUCOSE, UA: NEGATIVE mg/dL
KETONES UR: NEGATIVE mg/dL
LEUKOCYTES UA: NEGATIVE
Nitrite: NEGATIVE
Protein, ur: NEGATIVE mg/dL
Specific Gravity, Urine: 1.016 (ref 1.005–1.030)
pH: 5 (ref 5.0–8.0)

## 2018-02-04 LAB — URINE DRUG SCREEN, QUALITATIVE (ARMC ONLY)
AMPHETAMINES, UR SCREEN: NOT DETECTED
BENZODIAZEPINE, UR SCRN: NOT DETECTED
Barbiturates, Ur Screen: NOT DETECTED
CANNABINOID 50 NG, UR ~~LOC~~: NOT DETECTED
Cocaine Metabolite,Ur ~~LOC~~: NOT DETECTED
MDMA (Ecstasy)Ur Screen: NOT DETECTED
Methadone Scn, Ur: NOT DETECTED
Opiate, Ur Screen: DETECTED — AB
PHENCYCLIDINE (PCP) UR S: NOT DETECTED
TRICYCLIC, UR SCREEN: NOT DETECTED

## 2018-02-04 NOTE — Telephone Encounter (Signed)
Yes, per Dr. Jacinto Reap - he can come in for his lab apt up to this week.

## 2018-02-04 NOTE — Telephone Encounter (Signed)
Patient informed of need for testing and agrees to come in at [facility]

## 2018-02-04 NOTE — Telephone Encounter (Signed)
Patient called asking if he needs to have another urine test to see if his infection has cleared before he has his Bone Marrow done Monday. Please advise

## 2018-02-05 ENCOUNTER — Other Ambulatory Visit: Payer: Self-pay | Admitting: Student

## 2018-02-09 ENCOUNTER — Telehealth: Payer: Self-pay | Admitting: *Deleted

## 2018-02-09 ENCOUNTER — Other Ambulatory Visit (HOSPITAL_COMMUNITY)
Admission: RE | Admit: 2018-02-09 | Disposition: A | Discharge status: Home / Self Care | Payer: Medicare Other | Source: Ambulatory Visit | Attending: Internal Medicine | Admitting: Internal Medicine

## 2018-02-09 ENCOUNTER — Ambulatory Visit
Admission: RE | Admit: 2018-02-09 | Discharge: 2018-02-09 | Disposition: A | Discharge status: Home / Self Care | Payer: Medicare Other | Source: Ambulatory Visit | Attending: Internal Medicine | Admitting: Internal Medicine

## 2018-02-09 DIAGNOSIS — Z8249 Family history of ischemic heart disease and other diseases of the circulatory system: Secondary | ICD-10-CM | POA: Diagnosis not present

## 2018-02-09 DIAGNOSIS — C9112 Chronic lymphocytic leukemia of B-cell type in relapse: Secondary | ICD-10-CM | POA: Insufficient documentation

## 2018-02-09 DIAGNOSIS — Z79899 Other long term (current) drug therapy: Secondary | ICD-10-CM | POA: Diagnosis not present

## 2018-02-09 DIAGNOSIS — Z7902 Long term (current) use of antithrombotics/antiplatelets: Secondary | ICD-10-CM | POA: Diagnosis not present

## 2018-02-09 DIAGNOSIS — I10 Essential (primary) hypertension: Secondary | ICD-10-CM | POA: Insufficient documentation

## 2018-02-09 DIAGNOSIS — D539 Nutritional anemia, unspecified: Secondary | ICD-10-CM | POA: Insufficient documentation

## 2018-02-09 DIAGNOSIS — Z7982 Long term (current) use of aspirin: Secondary | ICD-10-CM | POA: Insufficient documentation

## 2018-02-09 DIAGNOSIS — C9192 Lymphoid leukemia, unspecified, in relapse: Secondary | ICD-10-CM | POA: Diagnosis present

## 2018-02-09 DIAGNOSIS — D696 Thrombocytopenia, unspecified: Secondary | ICD-10-CM | POA: Insufficient documentation

## 2018-02-09 DIAGNOSIS — E119 Type 2 diabetes mellitus without complications: Secondary | ICD-10-CM | POA: Diagnosis not present

## 2018-02-09 DIAGNOSIS — F17210 Nicotine dependence, cigarettes, uncomplicated: Secondary | ICD-10-CM | POA: Diagnosis not present

## 2018-02-09 DIAGNOSIS — Z7984 Long term (current) use of oral hypoglycemic drugs: Secondary | ICD-10-CM | POA: Diagnosis not present

## 2018-02-09 DIAGNOSIS — R3914 Feeling of incomplete bladder emptying: Secondary | ICD-10-CM

## 2018-02-09 DIAGNOSIS — R300 Dysuria: Secondary | ICD-10-CM

## 2018-02-09 DIAGNOSIS — Z9049 Acquired absence of other specified parts of digestive tract: Secondary | ICD-10-CM | POA: Diagnosis not present

## 2018-02-09 LAB — PROTIME-INR
INR: 1.04
PROTHROMBIN TIME: 13.5 s (ref 11.4–15.2)

## 2018-02-09 LAB — CBC WITH DIFFERENTIAL/PLATELET
Abs Immature Granulocytes: 0.04 10*3/uL (ref 0.00–0.07)
Basophils Absolute: 0.2 10*3/uL — ABNORMAL HIGH (ref 0.0–0.1)
Basophils Relative: 2 %
EOS ABS: 0.1 10*3/uL (ref 0.0–0.5)
EOS PCT: 1 %
HEMATOCRIT: 29.2 % — AB (ref 39.0–52.0)
HEMOGLOBIN: 9 g/dL — AB (ref 13.0–17.0)
Immature Granulocytes: 0 %
LYMPHS ABS: 3.4 10*3/uL (ref 0.7–4.0)
Lymphocytes Relative: 31 %
MCH: 31.5 pg (ref 26.0–34.0)
MCHC: 30.8 g/dL (ref 30.0–36.0)
MCV: 102.1 fL — ABNORMAL HIGH (ref 80.0–100.0)
MONOS PCT: 10 %
Monocytes Absolute: 1.1 10*3/uL — ABNORMAL HIGH (ref 0.1–1.0)
NRBC: 0 % (ref 0.0–0.2)
Neutro Abs: 6 10*3/uL (ref 1.7–7.7)
Neutrophils Relative %: 56 %
Platelets: 108 10*3/uL — ABNORMAL LOW (ref 150–400)
RBC: 2.86 MIL/uL — ABNORMAL LOW (ref 4.22–5.81)
RDW: 19 % — AB (ref 11.5–15.5)
WBC: 10.9 10*3/uL — ABNORMAL HIGH (ref 4.0–10.5)

## 2018-02-09 LAB — APTT: aPTT: 30 seconds (ref 24–36)

## 2018-02-09 LAB — GLUCOSE, CAPILLARY: Glucose-Capillary: 88 mg/dL (ref 70–99)

## 2018-02-09 MED ORDER — MIDAZOLAM HCL 5 MG/5ML IJ SOLN
INTRAMUSCULAR | Status: AC
  Filled 2018-02-09: qty 5

## 2018-02-09 MED ORDER — MIDAZOLAM HCL 5 MG/5ML IJ SOLN
INTRAMUSCULAR | Status: AC | PRN
  Administered 2018-02-09 (×3): 1 mg via INTRAVENOUS
  Administered 2018-02-09: 0.5 mg via INTRAVENOUS

## 2018-02-09 MED ORDER — HEPARIN SOD (PORK) LOCK FLUSH 100 UNIT/ML IV SOLN
INTRAVENOUS | Status: AC
  Filled 2018-02-09: qty 5

## 2018-02-09 MED ORDER — FENTANYL CITRATE (PF) 100 MCG/2ML IJ SOLN
INTRAMUSCULAR | Status: AC | PRN
  Administered 2018-02-09: 50 ug via INTRAVENOUS
  Administered 2018-02-09 (×2): 25 ug via INTRAVENOUS

## 2018-02-09 MED ORDER — SODIUM CHLORIDE 0.9 % IV SOLN
INTRAVENOUS | Status: DC
  Administered 2018-02-09: 09:00:00 via INTRAVENOUS

## 2018-02-09 MED ORDER — FENTANYL CITRATE (PF) 100 MCG/2ML IJ SOLN
INTRAMUSCULAR | Status: AC
  Filled 2018-02-09: qty 4

## 2018-02-09 NOTE — Discharge Instructions (Signed)
Bone Marrow Aspiration and Bone Marrow Biopsy, Adult, Care After °This sheet gives you information about how to care for yourself after your procedure. Your health care provider may also give you more specific instructions. If you have problems or questions, contact your health care provider. °What can I expect after the procedure? °After the procedure, it is common to have: °· Mild pain and tenderness. °· Swelling. °· Bruising. ° °Follow these instructions at home: °· Take over-the-counter or prescription medicines only as told by your health care provider. °· Do not take baths, swim, or use a hot tub until your health care provider approves. Ask if you can take a shower or have a sponge bath. °· Follow instructions from your health care provider about how to take care of the puncture site. Make sure you: °? Wash your hands with soap and water before you change your bandage (dressing). If soap and water are not available, use hand sanitizer. °? Change your dressing as told by your health care provider. °· Check your puncture site every day for signs of infection. Check for: °? More redness, swelling, or pain. °? More fluid or blood. °? Warmth. °? Pus or a bad smell. °· Return to your normal activities as told by your health care provider. Ask your health care provider what activities are safe for you. °· Do not drive for 24 hours if you were given a medicine to help you relax (sedative). °· Keep all follow-up visits as told by your health care provider. This is important. °Contact a health care provider if: °· You have more redness, swelling, or pain around the puncture site. °· You have more fluid or blood coming from the puncture site. °· Your puncture site feels warm to the touch. °· You have pus or a bad smell coming from the puncture site. °· You have a fever. °· Your pain is not controlled with medicine. °This information is not intended to replace advice given to you by your health care provider. Make sure  you discuss any questions you have with your health care provider. °Document Released: 11/02/2004 Document Revised: 11/03/2015 Document Reviewed: 09/27/2015 °Elsevier Interactive Patient Education © 2018 [employer] Inc. ° °

## 2018-02-09 NOTE — H&P (Signed)
Chief Complaint: CLL    Referring Physician(s): Brahmanday,Govinda R    History of Present Illness: James [name] is a 67 y.o. male with relapsed CLL, and new adenopathy by CT.  Here for repeat CT BM asp and core bx  No complaints today.  Past Medical History:  Diagnosis Date  . CLL (chronic lymphocytic leukemia) ([place])   . Diabetes mellitus without complication ([place])   . Hematuria   . Hypertension     Past Surgical History:  Procedure Laterality Date  . CATARACT EXTRACTION W/ INTRAOCULAR LENS IMPLANT Bilateral   . CHOLECYSTECTOMY  1983  . COLONOSCOPY WITH PROPOFOL N/A 10/27/2017   Procedure: COLONOSCOPY WITH PROPOFOL;  Surgeon: Manya Silvas, MD;  Location: [facility] ENDOSCOPY;  Service: Endoscopy;  Laterality: N/A;  . ESOPHAGOGASTRODUODENOSCOPY (EGD) WITH PROPOFOL N/A 11/20/2016   Procedure: ESOPHAGOGASTRODUODENOSCOPY (EGD) WITH PROPOFOL;  Surgeon: Manya Silvas, MD;  Location: [facility] ENDOSCOPY;  Service: Endoscopy;  Laterality: N/A;  . ESOPHAGOGASTRODUODENOSCOPY (EGD) WITH PROPOFOL N/A 10/27/2017   Procedure: ESOPHAGOGASTRODUODENOSCOPY (EGD) WITH PROPOFOL;  Surgeon: Manya Silvas, MD;  Location: [facility] ENDOSCOPY;  Service: Endoscopy;  Laterality: N/A;  . LOWER EXTREMITY ANGIOGRAPHY Right 05/19/2017   Procedure: LOWER EXTREMITY ANGIOGRAPHY;  Surgeon: Algernon Huxley, MD;  Location: [place] [facility] LAB;  Service: Cardiovascular;  Laterality: Right;    Allergies: Patient has no known allergies.  Medications: Prior to Admission medications   Medication Sig Start Date End Date Taking? Authorizing Provider  amLODipine (NORVASC) 10 MG tablet Take 1 tablet (10 mg total) by mouth daily. 12/13/16  Yes Cammie Sickle, MD  aspirin EC 81 MG tablet Take 81 mg by mouth daily.   Yes [provider]  atorvastatin (LIPITOR) 10 MG tablet Take 1 tablet (10 mg total) by mouth daily. 05/19/17 05/19/18 Yes Dew, Erskine Squibb, MD  IMBRUVICA 280 MG TABS TAKE 1 TABLET BY MOUTH DAILY.  12/15/17  Yes Cammie Sickle, MD  iron polysaccharides (NU-IRON) 150 MG capsule Take 150 mg by mouth daily.  06/17/17 06/17/18 Yes [provider]  lisinopril (PRINIVIL,ZESTRIL) 30 MG tablet Take 30 mg by mouth daily. 09/16/17  Yes [provider]  metFORMIN (GLUMETZA) 1000 MG (MOD) 24 hr tablet Take 1,000 mg by mouth 2 (two) times daily.   Yes [provider]  metoprolol tartrate (LOPRESSOR) 25 MG tablet TAKE 1 TABLET BY MOUTH TWICE DAILY 10/20/17  Yes Cammie Sickle, MD  omeprazole (PRILOSEC) 40 MG capsule Take 40 mg by mouth daily.  06/17/17  Yes [provider]  oxyCODONE-acetaminophen (PERCOCET/ROXICET) 5-325 MG tablet 1 pill every 8-12 hours 02/02/18  Yes Burns, Wandra Feinstein, NP  ciprofloxacin (CIPRO) 500 MG tablet Take 1 tablet (500 mg total) by mouth 2 (two) times daily. 01/25/18   Cammie Sickle, MD  clopidogrel (PLAVIX) 75 MG tablet Take 1 tablet (75 mg total) by mouth daily. Patient not taking: Reported on 01/22/2018 05/19/17   Algernon Huxley, MD     Family History  Problem Relation Age of Onset  . Hypertension Sister     Social History   Socioeconomic History  . Marital status: Married    Spouse name: James [name]  . Number of children: 1  . Years of education: Not on file  . Highest education level: Not on file  Occupational History  . 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 Financial resource strain: Not very hard  . Food insecurity:    Worry:  Never true    Inability: Never true  . Transportation needs:    Medical: No    Non-medical: No  Tobacco Use  . Smoking status: Current Every Day Smoker    Packs/day: 0.50    Years: 47.00    Pack years: 23.50    Types: Cigarettes  . Smokeless tobacco: Never Used  Substance and Sexual Activity  . Alcohol use: Yes    Alcohol/week: 1.0 standard drinks    Types: 1 Glasses of wine per week    Comment:  almost none in last 6 months  . Drug use: No  . Sexual  activity: Never    Partners: Female  Lifestyle  . Physical activity:    Days per week: Not on file    Minutes per session: Not on file  . Stress: To some extent  Relationships  . Social connections:    Talks on phone: Once a week    Gets together: Once a week    Attends religious service: Not on file    Active member of club or organization: Not on file    Attends meetings of clubs or organizations: Not on file    Relationship status: Married  Other Topics Concern  . Not on file  Social History Narrative  . Not on file    ECOG Status: 1 - Symptomatic but completely ambulatory  Review of Systems: A 12 point ROS discussed and pertinent positives are indicated in the HPI above.  All other systems are negative.  Review of Systems  Vital Signs: BP (!) 148/60   Pulse (!) 57   Temp 98.2 F (36.8 C)   Resp 14   Ht 5\' 8"  (1.727 m)   Wt 77.1 kg   SpO2 100%   BMI 25.85 kg/m   Physical Exam  Constitutional: He is oriented to person, place, and time. He appears well-developed and well-nourished. No distress.  Eyes: Conjunctivae are normal. No scleral icterus.  Cardiovascular: Normal rate and regular rhythm.  Pulmonary/Chest: Effort normal and breath sounds normal.  Abdominal: Soft. Bowel sounds are normal.  Musculoskeletal: He exhibits no edema.  Neurological: He is alert and oriented to person, place, and time.  Skin: He is not diaphoretic.  Psychiatric: He has a normal mood and affect.    Imaging: Ct Chest W Contrast  Addendum Date: 01/20/2018   ADDENDUM REPORT: 01/20/2018 18:19 ADDENDUM: The following was noted in the findings of the original report but also should have been included in the impression: Mild bladder wall thickening with mucosal enhancement, nonspecific. Correlate for hematuria and consider cystoscopy as clinically warranted. Electronically Signed   By: Julian Hy M.D.   On: 01/20/2018 18:19   Result Date: 01/20/2018 CLINICAL DATA:  Chronic  lymphoid leukemia, on chemotherapy EXAM: CT CHEST, ABDOMEN, AND PELVIS WITH CONTRAST TECHNIQUE: Multidetector CT imaging of the chest, abdomen and pelvis was performed following the standard protocol during bolus administration of intravenous contrast. CONTRAST:  122mL ISOVUE-300 IOPAMIDOL (ISOVUE-300) INJECTION 61% COMPARISON:  07/10/2017 FINDINGS: CT CHEST FINDINGS Cardiovascular: Heart is normal in size.  No pericardial effusion. No evidence of thoracic aortic aneurysm. Atherosclerotic calcifications of the aortic arch. Mild three-vessel coronary atherosclerosis. Mediastinum/Nodes: No suspicious mediastinal lymphadenopathy. Visualized thyroid is unremarkable. Lungs/Pleura: Mild centrilobular emphysematous changes. No focal consolidation. No suspicious pulmonary nodules. No pleural effusion or pneumothorax. Musculoskeletal: Degenerative changes of the lower thoracic spine. CT ABDOMEN PELVIS FINDINGS Hepatobiliary: Liver is within normal limits. Status post cholecystectomy. No intrahepatic or extrahepatic ductal dilatation. Pancreas: Within  normal limits. Spleen: Spleen is normal in size. Adrenals/Urinary Tract: Adrenal glands within normal limits. Bilateral renal cysts, including a 4.5 cm posterior left upper pole renal cyst and a 3.5 cm right renal sinus cysts. Mild right perinephric stranding, chronic. No hydronephrosis. Thick-walled bladder with mild left mucosal enhancement (series 2/image 114). Stomach/Bowel: Stomach is within normal limits. Mild soft tissue along the right lateral aspect of the duodenal bulb (series 2/image 69), chronic and non FDG avid, favoring ectopic pancreatic tissue/annular pancreas. No evidence of bowel obstruction. Normal appendix (series 2/image 96). No colonic wall thickening or inflammatory changes. Specifically, the wall thickening involving the ascending colon/cecum has resolved. Vascular/Lymphatic: No evidence of abdominal aortic aneurysm. Small retroperitoneal lymph nodes,  including a dominant 11 mm short axis left para-aortic node (series 2/image 84), previously 12 mm. Reproductive: Prostatomegaly, with enlargement of the central gland which indents the base of the bladder. Other: No abdominopelvic ascites. Musculoskeletal: Mild degenerative changes of the lumbar spine. IMPRESSION: Small retroperitoneal nodes, including an 11 mm short axis left para-aortic node, stable versus mildly decreased. No suspicious lymphadenopathy in the chest, abdomen, or pelvis. Spleen is normal in size. 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 Prior colonic wall thickening has resolved, likely infectious/inflammatory. Electronically Signed: By: Julian Hy M.D. On: 01/20/2018 15:38   Ct Abdomen Pelvis W Contrast  Addendum Date: 01/20/2018   ADDENDUM REPORT: 01/20/2018 18:19 ADDENDUM: The following was noted in the findings of the original report but also should have been included in the impression: Mild bladder wall thickening with mucosal enhancement, nonspecific. Correlate for hematuria and consider cystoscopy as clinically warranted. Electronically Signed   By: Julian Hy M.D.   On: 01/20/2018 18:19   Result Date: 01/20/2018 CLINICAL DATA:  Chronic lymphoid leukemia, on chemotherapy EXAM: CT CHEST, ABDOMEN, AND PELVIS WITH CONTRAST TECHNIQUE: Multidetector CT imaging of the chest, abdomen and pelvis was performed following the standard protocol during bolus administration of intravenous contrast. CONTRAST:  167mL ISOVUE-300 IOPAMIDOL (ISOVUE-300) INJECTION 61% COMPARISON:  07/10/2017 FINDINGS: CT CHEST FINDINGS Cardiovascular: Heart is normal in size.  No pericardial effusion. No evidence of thoracic aortic aneurysm. Atherosclerotic calcifications of the aortic arch. Mild three-vessel coronary atherosclerosis. Mediastinum/Nodes: No suspicious mediastinal lymphadenopathy. Visualized thyroid is  unremarkable. Lungs/Pleura: Mild centrilobular emphysematous changes. No focal consolidation. No suspicious pulmonary nodules. No pleural effusion or pneumothorax. Musculoskeletal: Degenerative changes of the lower thoracic spine. CT ABDOMEN PELVIS FINDINGS Hepatobiliary: Liver is within normal limits. Status post cholecystectomy. No intrahepatic or extrahepatic ductal dilatation. Pancreas: Within normal limits. Spleen: Spleen is normal in size. Adrenals/Urinary Tract: Adrenal glands within normal limits. Bilateral renal cysts, including a 4.5 cm posterior left upper pole renal cyst and a 3.5 cm right renal sinus cysts. Mild right perinephric stranding, chronic. No hydronephrosis. Thick-walled bladder with mild left mucosal enhancement (series 2/image 114). Stomach/Bowel: Stomach is within normal limits. Mild soft tissue along the right lateral aspect of the duodenal bulb (series 2/image 69), chronic and non FDG avid, favoring ectopic pancreatic tissue/annular pancreas. No evidence of bowel obstruction. Normal appendix (series 2/image 96). No colonic wall thickening or inflammatory changes. Specifically, the wall thickening involving the ascending colon/cecum has resolved. Vascular/Lymphatic: No evidence of abdominal aortic aneurysm. Small retroperitoneal lymph nodes, including a dominant 11 mm short axis left para-aortic node (series 2/image 84), previously 12 mm. Reproductive: Prostatomegaly, with enlargement of the central gland which indents the base of the bladder. Other: No abdominopelvic ascites.  Musculoskeletal: Mild degenerative changes of the lumbar spine. IMPRESSION: Small retroperitoneal nodes, including an 11 mm short axis left para-aortic node, stable versus mildly decreased. No suspicious lymphadenopathy in the chest, abdomen, or pelvis. Spleen is normal in size. 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 Prior  colonic wall thickening has resolved, likely infectious/inflammatory. Electronically Signed: By: Julian Hy M.D. On: 01/20/2018 15:38    Labs:  CBC: Recent Labs    10/08/17 1353 11/10/17 1002 01/12/18 0918 02/09/18 0802  WBC 10.5 8.1 9.3 10.9*  HGB 8.9* 9.2* 8.7* 9.0*  HCT 27.6* 27.9* 27.4* 29.2*  PLT 122* 98* 123* 108*    COAGS: Recent Labs    02/09/18 0802  INR 1.04  APTT 30    BMP: Recent Labs    09/03/17 1022 10/08/17 1353 11/10/17 1002 01/12/18 0918  NA 137 139 142 140  K 4.2 4.7 4.6 4.3  CL 105 110 106 112*  CO2 23 22 26  20*  GLUCOSE 111* 154* 106* 127*  BUN 18 19 17  25*  CALCIUM 8.7* 9.1 8.8* 8.5*  CREATININE 1.19 1.28* 1.32* 1.34*  GFRNONAA >60 56* 54* 53*  GFRAA >60 >60 >60 >60    LIVER FUNCTION TESTS: Recent Labs    08/04/17 1015 09/03/17 1022 11/10/17 1002 01/12/18 0918  BILITOT 0.6 0.6 0.7 0.3  AST 14* 14* 16 13*  ALT 8* 7* 13 11  ALKPHOS 72 82 90 84  PROT 6.8 6.8 7.0 6.7  ALBUMIN 4.2 4.0 4.0 3.7    TUMOR MARKERS: No results for input(s): AFPTM, CEA, CA199, CHROMGRNA in the last 8760 hours.  Assessment and Plan:  Relapsing CLL, here for repeat CT BM asp and core bx  Risks and benefits discussed with the patient including, but not limited to bleeding, infection, damage to adjacent structures or low yield requiring additional tests.  All of the patient's questions were answered, patient is agreeable to proceed. Consent signed and in chart.    Thank you for this interesting consult.  I greatly enjoyed meeting Jaymison Luber and look forward to participating in their care.  A copy of this report was sent to the requesting provider on this date.  Electronically Signed: Greggory Keen, MD 02/09/2018, 9:25 AM   I spent a total of  15 Minutes   in face to face in clinical consultation, greater than 50% of which was counseling/coordinating care for this patient with CLL

## 2018-02-09 NOTE — Procedures (Signed)
CLL  S/p RT ILIAC BM ASP AND CORE BX  No comp Stable EBL min Path pending Full report in pacs

## 2018-02-09 NOTE — Telephone Encounter (Signed)
Patient presents to clinic today to discuss his u/a results. He would like to know if he needs to continue taking cipro. MD reviewed labs. cipro is no longer needed. Patient reports dysuria and feelings of incomplete bladder emptying. I spoke with Dr. Rogue Bussing. Md recommends pyridium and a referral to urology.  Referral entered for urology. Colette, please coordinate referral to urology

## 2018-02-10 ENCOUNTER — Other Ambulatory Visit: Payer: Self-pay | Admitting: *Deleted

## 2018-02-10 DIAGNOSIS — R300 Dysuria: Secondary | ICD-10-CM

## 2018-02-10 MED ORDER — PHENAZOPYRIDINE HCL 100 MG PO TABS: 100.0000 mg | ORAL_TABLET | Freq: Three times a day (TID) | ORAL | 0 refills | Status: DC | PRN

## 2018-02-12 ENCOUNTER — Ambulatory Visit: Payer: Medicare Other | Admitting: Internal Medicine

## 2018-02-12 ENCOUNTER — Other Ambulatory Visit: Payer: Medicare Other

## 2018-02-13 ENCOUNTER — Other Ambulatory Visit: Payer: Self-pay | Admitting: Internal Medicine

## 2018-02-13 DIAGNOSIS — C8300 Small cell B-cell lymphoma, unspecified site: Secondary | ICD-10-CM

## 2018-02-16 ENCOUNTER — Encounter: Payer: Self-pay | Admitting: Internal Medicine

## 2018-02-16 ENCOUNTER — Inpatient Hospital Stay (HOSPITAL_BASED_OUTPATIENT_CLINIC_OR_DEPARTMENT_OTHER): Payer: Medicare Other | Admitting: Internal Medicine

## 2018-02-16 ENCOUNTER — Inpatient Hospital Stay: Payer: Medicare Other

## 2018-02-16 VITALS — BP 161/80 | HR 55 | Temp 98.0°F | Resp 18 | Wt 171.4 lb

## 2018-02-16 DIAGNOSIS — I10 Essential (primary) hypertension: Secondary | ICD-10-CM

## 2018-02-16 DIAGNOSIS — R1013 Epigastric pain: Secondary | ICD-10-CM | POA: Diagnosis not present

## 2018-02-16 DIAGNOSIS — C9192 Lymphoid leukemia, unspecified, in relapse: Secondary | ICD-10-CM | POA: Diagnosis not present

## 2018-02-16 DIAGNOSIS — F17210 Nicotine dependence, cigarettes, uncomplicated: Secondary | ICD-10-CM

## 2018-02-16 DIAGNOSIS — R1084 Generalized abdominal pain: Secondary | ICD-10-CM | POA: Diagnosis not present

## 2018-02-16 DIAGNOSIS — D649 Anemia, unspecified: Secondary | ICD-10-CM | POA: Diagnosis not present

## 2018-02-16 DIAGNOSIS — E119 Type 2 diabetes mellitus without complications: Secondary | ICD-10-CM

## 2018-02-16 DIAGNOSIS — G8929 Other chronic pain: Secondary | ICD-10-CM

## 2018-02-16 DIAGNOSIS — R5383 Other fatigue: Secondary | ICD-10-CM

## 2018-02-16 DIAGNOSIS — R634 Abnormal weight loss: Secondary | ICD-10-CM

## 2018-02-16 DIAGNOSIS — C9112 Chronic lymphocytic leukemia of B-cell type in relapse: Secondary | ICD-10-CM

## 2018-02-16 DIAGNOSIS — C8300 Small cell B-cell lymphoma, unspecified site: Secondary | ICD-10-CM

## 2018-02-16 LAB — COMPREHENSIVE METABOLIC PANEL
ALBUMIN: 3.7 g/dL (ref 3.5–5.0)
ALK PHOS: 78 U/L (ref 38–126)
ALT: 10 U/L (ref 0–44)
ANION GAP: 7 (ref 5–15)
AST: 13 U/L — ABNORMAL LOW (ref 15–41)
BUN: 22 mg/dL (ref 8–23)
CALCIUM: 8.9 mg/dL (ref 8.9–10.3)
CO2: 24 mmol/L (ref 22–32)
Chloride: 109 mmol/L (ref 98–111)
Creatinine, Ser: 1.37 mg/dL — ABNORMAL HIGH (ref 0.61–1.24)
GFR, EST AFRICAN AMERICAN: 59 mL/min — AB (ref >60)
GFR, EST NON AFRICAN AMERICAN: 51 mL/min — AB (ref >60)
Glucose, Bld: 102 mg/dL — ABNORMAL HIGH (ref 70–99)
Potassium: 4.9 mmol/L (ref 3.5–5.1)
SODIUM: 140 mmol/L (ref 135–145)
TOTAL PROTEIN: 6.7 g/dL (ref 6.5–8.1)
Total Bilirubin: 0.4 mg/dL (ref 0.3–1.2)

## 2018-02-16 LAB — CBC WITH DIFFERENTIAL/PLATELET
Abs Immature Granulocytes: 0.03 10*3/uL (ref 0.00–0.07)
BASOS ABS: 0.1 10*3/uL (ref 0.0–0.1)
BASOS PCT: 2 %
EOS ABS: 0.1 10*3/uL (ref 0.0–0.5)
Eosinophils Relative: 1 %
HCT: 24.9 % — ABNORMAL LOW (ref 39.0–52.0)
Hemoglobin: 7.5 g/dL — ABNORMAL LOW (ref 13.0–17.0)
IMMATURE GRANULOCYTES: 0 %
Lymphocytes Relative: 42 %
Lymphs Abs: 2.9 10*3/uL (ref 0.7–4.0)
MCH: 31 pg (ref 26.0–34.0)
MCHC: 30.1 g/dL (ref 30.0–36.0)
MCV: 102.9 fL — ABNORMAL HIGH (ref 80.0–100.0)
Monocytes Absolute: 0.7 10*3/uL (ref 0.1–1.0)
Monocytes Relative: 10 %
NEUTROS PCT: 45 %
NRBC: 0 % (ref 0.0–0.2)
Neutro Abs: 3.1 10*3/uL (ref 1.7–7.7)
PLATELETS: 130 10*3/uL — AB (ref 150–400)
RBC: 2.42 MIL/uL — ABNORMAL LOW (ref 4.22–5.81)
RDW: 18.5 % — AB (ref 11.5–15.5)
WBC: 6.9 10*3/uL (ref 4.0–10.5)

## 2018-02-16 NOTE — Assessment & Plan Note (Addendum)
#  CLL/SLL- relapsed currently on second line therapy with ibrutinib September 2016.  SEP 24th 2019-CT scan shows no progressive/worsening lymphadenopathy; however clinically worse [continue abdominal pain/weight loss/anemia]  #Given the ongoing worsening anemia-recommend holding ibrutinib.  Bone marrow biopsy reviewed-no obvious evidence of any acute leukemia; and also burden of CLL does not correlate with the severity of the anemia.  Given the hypercellularity question MDS; await cytogenetics.  FISH panel for CLL has been ordered.  # Anemia-hemoglobin- 7.5 ; worse; ? etiology CKD/no obvious evidence of iron deficiency.  Question ibrutinib.  Stop ibrutinib.  Reevaluate in 2 weeks.  If continues to get worse would recommend MDS panel/NGS peripheral blood.  # Abdominal pain/dyspepsia/unclear etiology; not improving.  S/p EGD/colonoscopy- no reason for pain found.  Continue Percocet; new refill given.   # STOP IBRUTINIB. # follow up in 2 weeks/labs-HOLD tube/cbc/bmp/possible 1 unit of blood transfusion.   

## 2018-02-16 NOTE — Progress Notes (Signed)
[facility] OFFICE PROGRESS NOTE  Patient Care Team: [name], MD as PCP - General (Internal Medicine)  Cancer Staging No matching staging information was found for the patient.   Oncology History   # 2006- CLL STAGE IV; MAY 2011- WBC- 57K;Platelets-99;Hb-12/CT Bulky LN; BMBx- 80% Invol; del 11; START Benda-Ritux x4 [finished Sep 2011];   # July 2015-Progression; Sep 2015-START ibrutinib; CT scan DEC 2015- Improvement LN; Cont Ibrutinib 152m/d; NOV 2016 CT- 1-2CM LN [mild progression compared to Dec 2015];NOV 2016- FISH peripheral blood- NO MUTATIONS/CD-38 Positive; NOV 7th- CONT IBRUTINIB 2 pills/day; CT AUG 2017- STABLE;  DEC 6th PET- Mild RP LN/ Retrocrural LN   SA skin infection [Oct 2016] s/p clinda -------------------------------------------------------    DIAGNOSIS: CLL  STAGE:   IV      ;GOALS: palliative  CURRENT/MOST RECENT THERAPY : Ibrutinib      CLL (chronic lymphoid leukemia) in relapse (HCC)      INTERVAL HISTORY:  [name]89y.o.  male pleasant patient above history of CLL and chronic abdominal pain of unclear etiology is here for follow-up/review the results of bone marrow biopsy.  Patient complains of worsening fatigue.  Denies any blood in stools black or stools.  He has not been taking iron pills as recommended.  Patient continues to complain of abdominal pain generalized.  Is currently on Percocet.  Review of Systems  Constitutional: Positive for malaise/fatigue and weight loss. Negative for chills, diaphoresis and fever.  HENT: Negative for nosebleeds and sore throat.   Eyes: Negative for double vision.  Respiratory: Negative for cough, hemoptysis, sputum production, shortness of breath and wheezing.   Cardiovascular: Negative for chest pain, palpitations, orthopnea and leg swelling.  Gastrointestinal: Positive for abdominal pain and nausea. Negative for blood in stool, constipation, diarrhea, heartburn, melena and  vomiting.  Genitourinary: Negative for dysuria, frequency and urgency.  Skin: Negative.  Negative for itching and rash.  Neurological: Negative for dizziness, tingling, focal weakness, weakness and headaches.  Endo/Heme/Allergies: Does not bruise/bleed easily.  Psychiatric/Behavioral: Negative for depression. The patient is not nervous/anxious and does not have insomnia.       PAST MEDICAL HISTORY :  Past Medical History:  Diagnosis Date  . CLL (chronic lymphocytic leukemia) ([place]   . [place] complication ([place]   . Hematuria   . Hypertension     PAST SURGICAL HISTORY :   Past Surgical History:  Procedure Laterality Date  . CATARACT EXTRACTION W/ INTRAOCULAR LENS IMPLANT Bilateral   . CHOLECYSTECTOMY  1983  . COLONOSCOPY WITH PROPOFOL N/A 10/27/2017   Procedure: COLONOSCOPY WITH PROPOFOL;  Surgeon: EManya Silvas MD;  [facility];  Service: Endoscopy;  Laterality: N/A;  . ESOPHAGOGASTRODUODENOSCOPY (EGD) WITH PROPOFOL N/A 11/20/2016   Procedure: ESOPHAGOGASTRODUODENOSCOPY (EGD) WITH PROPOFOL;  Surgeon: EManya Silvas MD;  [facility];  Service: Endoscopy;  Laterality: N/A;  . ESOPHAGOGASTRODUODENOSCOPY (EGD) WITH PROPOFOL N/A 10/27/2017   Procedure: ESOPHAGOGASTRODUODENOSCOPY (EGD) WITH PROPOFOL;  Surgeon: EManya Silvas MD;  [facility];  Service: Endoscopy;  Laterality: N/A;  . LOWER EXTREMITY ANGIOGRAPHY Right 05/19/2017   Procedure: LOWER EXTREMITY ANGIOGRAPHY;  Surgeon: DAlgernon Huxley MD;  [facility];  Service: Cardiovascular;  Laterality: Right;    FAMILY HISTORY :   Family History  Problem Relation Age of Onset  . Hypertension Sister     SOCIAL HISTORY:   Social History   Tobacco Use  . Smoking status: Current Every Day Smoker  Packs/day: 0.50    Years: 47.00    Pack years: 23.50    Types: Cigarettes  . Smokeless tobacco: Never Used  Substance Use Topics  . Alcohol use: Yes     Alcohol/week: 1.0 standard drinks    Types: 1 Glasses of wine per week    Comment:  almost none in last 6 months  . Drug use: No    ALLERGIES:  has No Known Allergies.  MEDICATIONS:  Current Outpatient Medications  Medication Sig Dispense Refill  . amLODipine (NORVASC) 10 MG tablet Take 1 tablet (10 mg total) by mouth daily. 30 tablet 3  . aspirin EC 81 MG tablet Take 81 mg by mouth daily.    Marland Kitchen atorvastatin (LIPITOR) 10 MG tablet Take 1 tablet (10 mg total) by mouth daily. 30 tablet 11  . ciprofloxacin (CIPRO) 500 MG tablet Take by mouth.    . clopidogrel (PLAVIX) 75 MG tablet Take 1 tablet (75 mg total) by mouth daily. 30 tablet 11  . IMBRUVICA 280 MG TABS TAKE 1 TABLET BY MOUTH DAILY. 28 tablet 6  . iron polysaccharides (NU-IRON) 150 MG capsule Take 150 mg by mouth daily.     Marland Kitchen lisinopril (PRINIVIL,ZESTRIL) 30 MG tablet Take 30 mg by mouth daily.  1  . metFORMIN (GLUMETZA) 1000 MG (MOD) 24 hr tablet Take 1,000 mg by mouth 2 (two) times daily.    . metoprolol tartrate (LOPRESSOR) 25 MG tablet TAKE 1 TABLET BY MOUTH TWICE DAILY 180 tablet 1  . omeprazole (PRILOSEC) 40 MG capsule Take 40 mg by mouth daily.     Marland Kitchen oxyCODONE-acetaminophen (PERCOCET/ROXICET) 5-325 MG tablet 1 pill every 8-12 hours 60 tablet 0  . phenazopyridine (PYRIDIUM) 100 MG tablet Take 1 tablet (100 mg total) by mouth 3 (three) times daily as needed for pain. (Patient not taking: Reported on 02/16/2018) 30 tablet 0   No current facility-administered medications for this visit.     PHYSICAL EXAMINATION: ECOG PERFORMANCE STATUS: 1 - Symptomatic but completely ambulatory  BP (!) 161/80 (BP Location: Left Arm, Patient Position: Sitting)   Pulse (!) 55   Temp 98 F (36.7 C) (Tympanic)   Resp 18   Wt 171 lb 6 oz (77.7 kg)   BMI 26.06 kg/m   Filed Weights   02/16/18 1345  Weight: 171 lb 6 oz (77.7 kg)    Physical Exam  Constitutional: He is oriented to person, place, and time and well-developed, well-nourished,  and in no distress.  Is accompanied by his wife.  Appears pale.  HENT:  Head: Normocephalic and atraumatic.  Mouth/Throat: Oropharynx is clear and moist. No oropharyngeal exudate.  Eyes: Pupils are equal, round, and reactive to light.  Neck: Normal range of motion. Neck supple.  Cardiovascular: Normal rate and regular rhythm.  Pulmonary/Chest: No respiratory distress. He has no wheezes.  Decreased air entry bil.   Abdominal: Soft. Bowel sounds are normal. He exhibits no distension and no mass. There is no tenderness. There is no rebound and no guarding.  ? Positive for splenomegaly.   Musculoskeletal: Normal range of motion. He exhibits no edema or tenderness.  Neurological: He is alert and oriented to person, place, and time.  Skin: Skin is warm. There is pallor.  Psychiatric: Affect normal.      LABORATORY DATA:  I have reviewed the data as listed    Component Value Date/Time   NA 140 02/16/2018 1311   NA 142 05/03/2014 1139   K 4.9 02/16/2018 1311   K  4.7 05/03/2014 1139   CL 109 02/16/2018 1311   CL 110 (H) 05/03/2014 1139   CO2 24 02/16/2018 1311   CO2 24 05/03/2014 1139   GLUCOSE 102 (H) 02/16/2018 1311   GLUCOSE 98 05/03/2014 1139   BUN 22 02/16/2018 1311   BUN 20 (H) 05/03/2014 1139   CREATININE 1.37 (H) 02/16/2018 1311   CREATININE 1.22 08/09/2014 1122   CALCIUM 8.9 02/16/2018 1311   CALCIUM 8.6 05/03/2014 1139   PROT 6.7 02/16/2018 1311   PROT 7.5 05/03/2014 1139   ALBUMIN 3.7 02/16/2018 1311   ALBUMIN 4.1 05/03/2014 1139   AST 13 (L) 02/16/2018 1311   AST 15 05/03/2014 1139   ALT 10 02/16/2018 1311   ALT 26 05/03/2014 1139   ALKPHOS 78 02/16/2018 1311   ALKPHOS 94 05/03/2014 1139   BILITOT 0.4 02/16/2018 1311   BILITOT 0.5 05/03/2014 1139   GFRNONAA 51 (L) 02/16/2018 1311   GFRNONAA >60 08/09/2014 1122   GFRAA 59 (L) 02/16/2018 1311   GFRAA >60 08/09/2014 1122    No results found for: SPEP, UPEP  Lab Results  Component Value Date   WBC 6.9  02/16/2018   NEUTROABS 3.1 02/16/2018   HGB 7.5 (L) 02/16/2018   HCT 24.9 (L) 02/16/2018   MCV 102.9 (H) 02/16/2018   PLT 130 (L) 02/16/2018      Chemistry      Component Value Date/Time   NA 140 02/16/2018 1311   NA 142 05/03/2014 1139   K 4.9 02/16/2018 1311   K 4.7 05/03/2014 1139   CL 109 02/16/2018 1311   CL 110 (H) 05/03/2014 1139   CO2 24 02/16/2018 1311   CO2 24 05/03/2014 1139   BUN 22 02/16/2018 1311   BUN 20 (H) 05/03/2014 1139   CREATININE 1.37 (H) 02/16/2018 1311   CREATININE 1.22 08/09/2014 1122      Component Value Date/Time   CALCIUM 8.9 02/16/2018 1311   CALCIUM 8.6 05/03/2014 1139   ALKPHOS 78 02/16/2018 1311   ALKPHOS 94 05/03/2014 1139   AST 13 (L) 02/16/2018 1311   AST 15 05/03/2014 1139   ALT 10 02/16/2018 1311   ALT 26 05/03/2014 1139   BILITOT 0.4 02/16/2018 1311   BILITOT 0.5 05/03/2014 1139       RADIOGRAPHIC STUDIES: I have personally reviewed the radiological images as listed and agreed with the findings in the report. No results found.   ASSESSMENT & PLAN:  CLL (chronic lymphoid leukemia) in relapse ([place]) # CLL/SLL- relapsed currently on second line therapy with ibrutinib September 2016.  SEP 24th 2019-CT scan shows no progressive/worsening lymphadenopathy; however clinically worse [continue abdominal pain/weight loss/anemia]  #Given the ongoing worsening anemia-recommend holding ibrutinib.  Bone marrow biopsy reviewed-no obvious evidence of any acute leukemia; and also burden of CLL does not correlate with the severity of the anemia.  Given the hypercellularity question MDS; await cytogenetics.  FISH panel for CLL has been ordered.  # Anemia-hemoglobin- 7.5 ; worse; ? etiology CKD/no obvious evidence of iron deficiency.  Question ibrutinib.  Stop ibrutinib.  Reevaluate in 2 weeks.  If continues to get worse would recommend MDS panel/NGS peripheral blood.  # Abdominal pain/dyspepsia/unclear etiology; not improving.  S/p EGD/colonoscopy-  no reason for pain found.  Continue Percocet; new refill given.   # STOP IBRUTINIB. # follow up in 2 weeks/labs-HOLD tube/cbc/bmp/possible 1 unit of blood transfusion.     Orders Placed This Encounter  Procedures  . CBC with Differential/Platelet    Standing  Status:   Future    Standing Expiration Date:   02/17/2019  . Comprehensive metabolic panel    Standing Status:   Future    Standing Expiration Date:   02/17/2019  . Sample to Blood Bank    Standing Status:   Future    Standing Expiration Date:   02/17/2019   All questions were answered. The patient knows to call the clinic with any problems, questions or concerns.      Cammie Sickle, MD 02/16/2018 6:21 PM

## 2018-02-16 NOTE — Patient Instructions (Signed)
#   STOP  Ibrutinib until further directions.   # take iron pill every other day.

## 2018-02-16 NOTE — Progress Notes (Signed)
Pt reports continued "abdominal discomfort, pain med only takes edge off".  Pt also reports decreased appetite, drinking 1-2 ensure a day.  Pt reports going to walk in clinic over weekend and started on Cipro for urinary pain, to see Dr Rogers Blocker today.

## 2018-02-18 ENCOUNTER — Telehealth: Payer: Self-pay | Admitting: Internal Medicine

## 2018-02-18 ENCOUNTER — Encounter (HOSPITAL_COMMUNITY): Payer: Self-pay | Admitting: Internal Medicine

## 2018-02-18 NOTE — Telephone Encounter (Signed)
Left a message for Dr. Ruby Cola discuss the cytogenetics.

## 2018-02-19 ENCOUNTER — Telehealth: Payer: Self-pay | Admitting: Internal Medicine

## 2018-02-19 DIAGNOSIS — C8300 Small cell B-cell lymphoma, unspecified site: Secondary | ICD-10-CM

## 2018-02-19 NOTE — Telephone Encounter (Signed)
Spoke to hemato-pathology, Dr.Smir. Will need further testing- recommend [facility].  Brooke- please ask pt to come for the above lab asap/ ? Tomorrow [I can sign the order]. Thanks. GB

## 2018-02-19 NOTE — Telephone Encounter (Signed)
Will get labs tomorrow/hold tube.

## 2018-02-19 NOTE — Telephone Encounter (Signed)
[facility] form has been filled out. Awaiting MD signature. Colette, would you mind calling and scheduling patient for a lab appt tomorrow?

## 2018-02-20 ENCOUNTER — Inpatient Hospital Stay: Payer: Medicare Other

## 2018-02-20 DIAGNOSIS — C8300 Small cell B-cell lymphoma, unspecified site: Secondary | ICD-10-CM

## 2018-02-20 DIAGNOSIS — C9192 Lymphoid leukemia, unspecified, in relapse: Secondary | ICD-10-CM | POA: Diagnosis not present

## 2018-02-20 LAB — CBC WITH DIFFERENTIAL/PLATELET
Abs Immature Granulocytes: 0.02 10*3/uL (ref 0.00–0.07)
BASOS PCT: 2 %
Basophils Absolute: 0.1 10*3/uL (ref 0.0–0.1)
EOS ABS: 0.1 10*3/uL (ref 0.0–0.5)
EOS PCT: 3 %
HEMATOCRIT: 24.2 % — AB (ref 39.0–52.0)
Hemoglobin: 7.2 g/dL — ABNORMAL LOW (ref 13.0–17.0)
IMMATURE GRANULOCYTES: 0 %
Lymphocytes Relative: 20 %
Lymphs Abs: 1.1 10*3/uL (ref 0.7–4.0)
MCH: 30.6 pg (ref 26.0–34.0)
MCHC: 29.8 g/dL — ABNORMAL LOW (ref 30.0–36.0)
MCV: 103 fL — AB (ref 80.0–100.0)
MONO ABS: 0.9 10*3/uL (ref 0.1–1.0)
MONOS PCT: 17 %
NEUTROS PCT: 58 %
Neutro Abs: 3.1 10*3/uL (ref 1.7–7.7)
PLATELETS: 155 10*3/uL (ref 150–400)
RBC: 2.35 MIL/uL — ABNORMAL LOW (ref 4.22–5.81)
RDW: 18.8 % — AB (ref 11.5–15.5)
WBC: 5.4 10*3/uL (ref 4.0–10.5)
nRBC: 0 % (ref 0.0–0.2)

## 2018-02-20 LAB — SAMPLE TO BLOOD BANK

## 2018-02-23 ENCOUNTER — Other Ambulatory Visit: Payer: Self-pay | Admitting: *Deleted

## 2018-02-23 ENCOUNTER — Other Ambulatory Visit: Payer: Self-pay | Admitting: Oncology

## 2018-02-23 DIAGNOSIS — C9192 Lymphoid leukemia, unspecified, in relapse: Secondary | ICD-10-CM

## 2018-02-23 DIAGNOSIS — C9112 Chronic lymphocytic leukemia of B-cell type in relapse: Secondary | ICD-10-CM

## 2018-02-23 DIAGNOSIS — C8300 Small cell B-cell lymphoma, unspecified site: Secondary | ICD-10-CM

## 2018-02-23 MED ORDER — OXYCODONE-ACETAMINOPHEN 5-325 MG PO TABS: ORAL_TABLET | ORAL | 0 refills | Status: DC

## 2018-02-23 NOTE — Telephone Encounter (Signed)
Patient called cancer center requesting refill of Norco 5-325mg  q 8-12 hours PRN for Pain.   As mandated by the [place] STOP Act (Strengthen Opioid Misuse Prevention), the [place] Controlled Substance Reporting System ([place]) was reviewed for this patient.  Below is the past 67-months of controlled substance prescriptions as displayed by the registry.  I have personally consulted with my supervising physician, Dr. Rogue Bussing, who agrees that continuation of opiate therapy is medically appropriate at this time and agrees to provide continual monitoring, including urine/blood drug screens, as indicated. Refill is appropriate on or after 02/21/18.  NCCSRS reviewed:    Faythe Casa, NP 02/23/2018 11:12 AM [phone]

## 2018-02-23 NOTE — Progress Notes (Signed)
RX sent.   Faythe Casa, NP 02/23/2018 1:03 PM

## 2018-03-03 MED FILL — IMBRUVICA 280 MG TAB: 280 | 28 days supply | Qty: 28 | Fill #3

## 2018-03-04 ENCOUNTER — Inpatient Hospital Stay (HOSPITAL_BASED_OUTPATIENT_CLINIC_OR_DEPARTMENT_OTHER): Payer: Medicare Other | Admitting: Internal Medicine

## 2018-03-04 ENCOUNTER — Other Ambulatory Visit: Payer: Self-pay | Admitting: Internal Medicine

## 2018-03-04 ENCOUNTER — Other Ambulatory Visit: Payer: Self-pay

## 2018-03-04 ENCOUNTER — Inpatient Hospital Stay: Payer: Medicare Other

## 2018-03-04 ENCOUNTER — Encounter: Payer: Self-pay | Admitting: Internal Medicine

## 2018-03-04 ENCOUNTER — Inpatient Hospital Stay: Payer: Medicare Other | Attending: Internal Medicine

## 2018-03-04 VITALS — BP 155/78 | HR 56 | Temp 98.9°F | Resp 18 | Wt 176.4 lb

## 2018-03-04 DIAGNOSIS — G8929 Other chronic pain: Secondary | ICD-10-CM | POA: Insufficient documentation

## 2018-03-04 DIAGNOSIS — F17210 Nicotine dependence, cigarettes, uncomplicated: Secondary | ICD-10-CM | POA: Diagnosis not present

## 2018-03-04 DIAGNOSIS — R5383 Other fatigue: Secondary | ICD-10-CM | POA: Diagnosis not present

## 2018-03-04 DIAGNOSIS — D649 Anemia, unspecified: Secondary | ICD-10-CM

## 2018-03-04 DIAGNOSIS — C9192 Lymphoid leukemia, unspecified, in relapse: Secondary | ICD-10-CM

## 2018-03-04 DIAGNOSIS — I10 Essential (primary) hypertension: Secondary | ICD-10-CM | POA: Diagnosis not present

## 2018-03-04 DIAGNOSIS — R1013 Epigastric pain: Secondary | ICD-10-CM | POA: Diagnosis not present

## 2018-03-04 DIAGNOSIS — C9112 Chronic lymphocytic leukemia of B-cell type in relapse: Secondary | ICD-10-CM

## 2018-03-04 LAB — CBC WITH DIFFERENTIAL/PLATELET
BASOS ABS: 0.1 10*3/uL (ref 0.0–0.1)
BASOS PCT: 1 %
Eosinophils Absolute: 0.3 10*3/uL (ref 0.0–0.5)
Eosinophils Relative: 3 %
HCT: 23.7 % — ABNORMAL LOW (ref 39.0–52.0)
Hemoglobin: 7.2 g/dL — ABNORMAL LOW (ref 13.0–17.0)
LYMPHS ABS: 5.2 10*3/uL — AB (ref 0.7–4.0)
Lymphocytes Relative: 54 %
MCH: 31.6 pg (ref 26.0–34.0)
MCHC: 30.4 g/dL (ref 30.0–36.0)
MCV: 103.9 fL — AB (ref 80.0–100.0)
Monocytes Absolute: 0.7 10*3/uL (ref 0.1–1.0)
Monocytes Relative: 7 %
NEUTROS ABS: 3.4 10*3/uL (ref 1.7–7.7)
NEUTROS PCT: 35 %
NRBC: 0 % (ref 0.0–0.2)
PLATELETS: 168 10*3/uL (ref 150–400)
RBC: 2.28 MIL/uL — ABNORMAL LOW (ref 4.22–5.81)
RDW: 19.1 % — AB (ref 11.5–15.5)
WBC MORPHOLOGY: ABNORMAL
WBC: 9.7 10*3/uL (ref 4.0–10.5)

## 2018-03-04 LAB — COMPREHENSIVE METABOLIC PANEL
ALBUMIN: 3.9 g/dL (ref 3.5–5.0)
ALT: 11 U/L (ref 0–44)
ANION GAP: 6 (ref 5–15)
AST: 15 U/L (ref 15–41)
Alkaline Phosphatase: 91 U/L (ref 38–126)
BUN: 15 mg/dL (ref 8–23)
CO2: 23 mmol/L (ref 22–32)
Calcium: 8.6 mg/dL — ABNORMAL LOW (ref 8.9–10.3)
Chloride: 110 mmol/L (ref 98–111)
Creatinine, Ser: 1.15 mg/dL (ref 0.61–1.24)
GFR calc non Af Amer: >60 mL/min (ref >60)
GLUCOSE: 138 mg/dL — AB (ref 70–99)
POTASSIUM: 4.6 mmol/L (ref 3.5–5.1)
SODIUM: 139 mmol/L (ref 135–145)
TOTAL PROTEIN: 6.7 g/dL (ref 6.5–8.1)
Total Bilirubin: 0.3 mg/dL (ref 0.3–1.2)

## 2018-03-04 LAB — ABO/RH: ABO/RH(D): A POS

## 2018-03-04 LAB — PREPARE RBC (CROSSMATCH)

## 2018-03-04 LAB — SAMPLE TO BLOOD BANK

## 2018-03-04 MED ORDER — SODIUM CHLORIDE 0.9% IV SOLUTION
250.0000 mL | Freq: Once | INTRAVENOUS | Status: AC
  Administered 2018-03-04: 250 mL via INTRAVENOUS
  Filled 2018-03-04: qty 250

## 2018-03-04 MED ORDER — DIPHENHYDRAMINE HCL 25 MG PO CAPS
25.0000 mg | ORAL_CAPSULE | Freq: Once | ORAL | Status: AC
  Administered 2018-03-04: 25 mg via ORAL
  Filled 2018-03-04: qty 1

## 2018-03-04 MED ORDER — LINACLOTIDE 145 MCG PO CAPS: 145.0000 ug | ORAL_CAPSULE | Freq: Every day | ORAL | 3 refills | Status: DC

## 2018-03-04 MED ORDER — ACETAMINOPHEN 325 MG PO TABS
650.0000 mg | ORAL_TABLET | Freq: Once | ORAL | Status: AC
  Administered 2018-03-04: 650 mg via ORAL
  Filled 2018-03-04: qty 2

## 2018-03-04 NOTE — Assessment & Plan Note (Addendum)
#  CLL/SLL- relapsed currently off ibrutinib [sec ton intolerance in sep 2019]; SEP 24th 2019-CT scan shows no progressive/worsening lymphadenopathy; however clinically worse [continue abdominal pain/weight loss/anemia]  #Given the ongoing worsening anemia-continue holding ibrutinib.  Bone marrow biopsy reviewed-no obvious evidence of any acute leukemia; and also burden of CLL does not correlate with the severity of the anemia.  Given the hypercellularity question MDS- await [facility] testing.  Discussed with Dr. Gari Crown.   # Anemia-hemoglobin- 7.2 ; worse; ? etiology CKD/no obvious evidence of iron deficiency.  Await MDS panel/NGS peripheral blood. Proceed wit PRBC transfusion.   # Abdominal pain/dyspepsia/unclear etiology; not improving. ? IBS-with constipation- recommend linzess '14mg'$ /day; new script given.   # DISPOSITION:  # PRBC 1 unit today.  # follow up in 2 weeks/labs-HOLD tube/cbc/bmp/possible 1 unit of blood transfusion.

## 2018-03-04 NOTE — Progress Notes (Signed)
[facility] OFFICE PROGRESS NOTE  Patient Care Team: Hande, Vishwanath, MD as PCP - General (Internal Medicine)  Cancer Staging No matching staging information was found for the patient.   Oncology History   # 2006- CLL STAGE IV; MAY 2011- WBC- 57K;Platelets-99;Hb-12/CT Bulky LN; BMBx- 80% Invol; del 11; START Benda-Ritux x4 [finished Sep 2011];   # July 2015-Progression; Sep 2015-START ibrutinib; CT scan DEC 2015- Improvement LN; Cont Ibrutinib 140mg/d; NOV 2016 CT- 1-2CM LN [mild progression compared to Dec 2015];NOV 2016- FISH peripheral blood- NO MUTATIONS/CD-38 Positive; NOV 7th- CONT IBRUTINIB 2 pills/day; CT AUG 2017- STABLE;  DEC 6th PET- Mild RP LN/ Retrocrural LN   SA skin infection [Oct 2016] s/p clinda -------------------------------------------------------    DIAGNOSIS: CLL  STAGE:   IV      ;GOALS: palliative  CURRENT/MOST RECENT THERAPY : Ibrutinib      CLL (chronic lymphoid leukemia) in relapse (HCC)    INTERVAL HISTORY:  James [name] 67 y.o.  male pleasant patient above history of CLL and chronic abdominal pain of unclear etiology is here for follow-up.  In the interim patient had an NGS testing on the peripheral blood.  Patient continues to complain of fatigue.  No blood in stools black or stools.  Currently off ibrutinib.  Continues to have chronic intermittent abdominal pain stable on Percocet.  Review of Systems  Constitutional: Positive for malaise/fatigue and weight loss. Negative for chills, diaphoresis and fever.  HENT: Negative for nosebleeds and sore throat.   Eyes: Negative for double vision.  Respiratory: Negative for cough, hemoptysis, sputum production, shortness of breath and wheezing.   Cardiovascular: Negative for chest pain, palpitations, orthopnea and leg swelling.  Gastrointestinal: Positive for abdominal pain and nausea. Negative for blood in stool, constipation, diarrhea, heartburn, melena and vomiting.   Genitourinary: Negative for dysuria, frequency and urgency.  Skin: Negative.  Negative for itching and rash.  Neurological: Negative for dizziness, tingling, focal weakness, weakness and headaches.  Endo/Heme/Allergies: Does not bruise/bleed easily.  Psychiatric/Behavioral: Negative for depression. The patient is not nervous/anxious and does not have insomnia.       PAST MEDICAL HISTORY :  Past Medical History:  Diagnosis Date  . CLL (chronic lymphocytic leukemia) (HCC)   . Diabetes mellitus without complication (HCC)   . Hematuria   . Hypertension     PAST SURGICAL HISTORY :   Past Surgical History:  Procedure Laterality Date  . CATARACT EXTRACTION W/ INTRAOCULAR LENS IMPLANT Bilateral   . CHOLECYSTECTOMY  1983  . COLONOSCOPY WITH PROPOFOL N/A 10/27/2017   Procedure: COLONOSCOPY WITH PROPOFOL;  Surgeon: Elliott, Robert T, MD;  Location: [facility] ENDOSCOPY;  Service: Endoscopy;  Laterality: N/A;  . ESOPHAGOGASTRODUODENOSCOPY (EGD) WITH PROPOFOL N/A 11/20/2016   Procedure: ESOPHAGOGASTRODUODENOSCOPY (EGD) WITH PROPOFOL;  Surgeon: Elliott, Robert T, MD;  Location: [facility];  Service: Endoscopy;  Laterality: N/A;  . ESOPHAGOGASTRODUODENOSCOPY (EGD) WITH PROPOFOL N/A 10/27/2017   Procedure: ESOPHAGOGASTRODUODENOSCOPY (EGD) WITH PROPOFOL;  Surgeon: Elliott, Robert T, MD;  Location: [facility] END[facility]COPY;  Service: Endoscopy;  Laterality: N/A;  . LOWER EXTREMITY ANGIOGRAPHY Right 05/19/2017   Procedure: LOWER EXTREMITY ANGIOGRAPHY;  Surgeon: Dew, Jason S, MD;  Location: [facility];  Service: Cardiovascular;  Laterality: Right;    FAMILY HISTORY :   Family History  Problem Relation Age of Onset  . Hypertension Sister     SOCIAL HISTORY:   Social History   Tobacco Use  . Smoking status: Current Every Day Smoker    Packs/day: 0.50      Years: 47.00    Pack years: 23.50    Types: Cigarettes  . Smokeless tobacco: Never Used  Substance Use Topics  . Alcohol use: Yes     Alcohol/week: 1.0 standard drinks    Types: 1 Glasses of wine per week    Comment:  almost none in last 6 months  . Drug use: No    ALLERGIES:  has No Known Allergies.  MEDICATIONS:  Current Outpatient Medications  Medication Sig Dispense Refill  . amLODipine (NORVASC) 10 MG tablet Take 1 tablet (10 mg total) by mouth daily. 30 tablet 3  . aspirin EC 81 MG tablet Take 81 mg by mouth daily.    Marland Kitchen atorvastatin (LIPITOR) 10 MG tablet Take 1 tablet (10 mg total) by mouth daily. 30 tablet 11  . clopidogrel (PLAVIX) 75 MG tablet Take 1 tablet (75 mg total) by mouth daily. 30 tablet 11  . IMBRUVICA 280 MG TABS TAKE 1 TABLET BY MOUTH DAILY. 28 tablet 6  . iron polysaccharides (NU-IRON) 150 MG capsule Take 150 mg by mouth daily.     Marland Kitchen lisinopril (PRINIVIL,ZESTRIL) 30 MG tablet Take 30 mg by mouth daily.  1  . metFORMIN (GLUMETZA) 1000 MG (MOD) 24 hr tablet Take 1,000 mg by mouth 2 (two) times daily.    . metoprolol tartrate (LOPRESSOR) 25 MG tablet TAKE 1 TABLET BY MOUTH TWICE DAILY 180 tablet 1  . omeprazole (PRILOSEC) 40 MG capsule Take 40 mg by mouth daily.     Marland Kitchen oxyCODONE-acetaminophen (PERCOCET/ROXICET) 5-325 MG tablet 1 pill every 8-12 hours 60 tablet 0  . linaclotide (LINZESS) 145 MCG CAPS capsule Take 1 capsule (145 mcg total) by mouth daily before breakfast. 30 capsule 3  . phenazopyridine (PYRIDIUM) 100 MG tablet Take 1 tablet (100 mg total) by mouth 3 (three) times daily as needed for pain. (Patient not taking: Reported on 02/16/2018) 30 tablet 0   No current facility-administered medications for this visit.     PHYSICAL EXAMINATION: ECOG PERFORMANCE STATUS: 1 - Symptomatic but completely ambulatory  BP (!) 155/78 (BP Location: Left Arm, Patient Position: Sitting)   Pulse (!) 56   Temp 98.9 F (37.2 C) (Tympanic)   Resp 18   Wt 176 lb 6 oz (80 kg)   BMI 26.82 kg/m   Filed Weights   03/04/18 0946  Weight: 176 lb 6 oz (80 kg)    Physical Exam  Constitutional: He is  oriented to person, place, and time and well-developed, well-nourished, and in no distress.  Is accompanied by his wife.  Appears pale.  HENT:  Head: Normocephalic and atraumatic.  Mouth/Throat: Oropharynx is clear and moist. No oropharyngeal exudate.  Eyes: Pupils are equal, round, and reactive to light.  Neck: Normal range of motion. Neck supple.  Cardiovascular: Normal rate and regular rhythm.  Pulmonary/Chest: No respiratory distress. He has no wheezes.  Decreased air entry bil.   Abdominal: Soft. Bowel sounds are normal. He exhibits no distension and no mass. There is no tenderness. There is no rebound and no guarding.  ? Positive for splenomegaly.   Musculoskeletal: Normal range of motion. He exhibits no edema or tenderness.  Neurological: He is alert and oriented to person, place, and time.  Skin: Skin is warm. There is pallor.  Psychiatric: Affect normal.      LABORATORY DATA:  I have reviewed the data as listed    Component Value Date/Time   NA 139 03/04/2018 0859   NA 142 05/03/2014 1139   K 4.6  03/04/2018 0859   K 4.7 05/03/2014 1139   CL 110 03/04/2018 0859   CL 110 (H) 05/03/2014 1139   CO2 23 03/04/2018 0859   CO2 24 05/03/2014 1139   GLUCOSE 138 (H) 03/04/2018 0859   GLUCOSE 98 05/03/2014 1139   BUN 15 03/04/2018 0859   BUN 20 (H) 05/03/2014 1139   CREATININE 1.15 03/04/2018 0859   CREATININE 1.22 08/09/2014 1122   CALCIUM 8.6 (L) 03/04/2018 0859   CALCIUM 8.6 05/03/2014 1139   PROT 6.7 03/04/2018 0859   PROT 7.5 05/03/2014 1139   ALBUMIN 3.9 03/04/2018 0859   ALBUMIN 4.1 05/03/2014 1139   AST 15 03/04/2018 0859   AST 15 05/03/2014 1139   ALT 11 03/04/2018 0859   ALT 26 05/03/2014 1139   ALKPHOS 91 03/04/2018 0859   ALKPHOS 94 05/03/2014 1139   BILITOT 0.3 03/04/2018 0859   BILITOT 0.5 05/03/2014 1139   GFRNONAA >60 03/04/2018 0859   GFRNONAA >60 08/09/2014 1122   GFRAA >60 03/04/2018 0859   GFRAA >60 08/09/2014 1122    No results found for:  SPEP, UPEP  Lab Results  Component Value Date   WBC 9.7 03/04/2018   NEUTROABS 3.4 03/04/2018   HGB 7.2 (L) 03/04/2018   HCT 23.7 (L) 03/04/2018   MCV 103.9 (H) 03/04/2018   PLT 168 03/04/2018      Chemistry      Component Value Date/Time   NA 139 03/04/2018 0859   NA 142 05/03/2014 1139   K 4.6 03/04/2018 0859   K 4.7 05/03/2014 1139   CL 110 03/04/2018 0859   CL 110 (H) 05/03/2014 1139   CO2 23 03/04/2018 0859   CO2 24 05/03/2014 1139   BUN 15 03/04/2018 0859   BUN 20 (H) 05/03/2014 1139   CREATININE 1.15 03/04/2018 0859   CREATININE 1.22 08/09/2014 1122      Component Value Date/Time   CALCIUM 8.6 (L) 03/04/2018 0859   CALCIUM 8.6 05/03/2014 1139   ALKPHOS 91 03/04/2018 0859   ALKPHOS 94 05/03/2014 1139   AST 15 03/04/2018 0859   AST 15 05/03/2014 1139   ALT 11 03/04/2018 0859   ALT 26 05/03/2014 1139   BILITOT 0.3 03/04/2018 0859   BILITOT 0.5 05/03/2014 1139       RADIOGRAPHIC STUDIES: I have personally reviewed the radiological images as listed and agreed with the findings in the report. No results found.   ASSESSMENT & PLAN:  CLL (chronic lymphoid leukemia) in relapse ([place]) # CLL/SLL- relapsed currently off ibrutinib [sec ton intolerance in sep 2019]; SEP 24th 2019-CT scan shows no progressive/worsening lymphadenopathy; however clinically worse [continue abdominal pain/weight loss/anemia]  #Given the ongoing worsening anemia-continue holding ibrutinib.  Bone marrow biopsy reviewed-no obvious evidence of any acute leukemia; and also burden of CLL does not correlate with the severity of the anemia.  Given the hypercellularity question MDS- await [facility] testing.  Discussed with Dr. Gari Crown.   # Anemia-hemoglobin- 7.2 ; worse; ? etiology CKD/no obvious evidence of iron deficiency.  Await MDS panel/NGS peripheral blood. Proceed wit PRBC transfusion.   # Abdominal pain/dyspepsia/unclear etiology; not improving. ? IBS-with constipation- recommend  linzess 56m/day; new script given.   # DISPOSITION:  # PRBC 1 unit today.  # follow up in 2 weeks/labs-HOLD tube/cbc/bmp/possible 1 unit of blood transfusion.     Orders Placed This Encounter  Procedures  . CBC with Differential/Platelet    Standing Status:   Future    Standing Expiration Date:  03/05/2019  . Comprehensive metabolic panel    Standing Status:   Future    Standing Expiration Date:   03/05/2019  . Sample to Blood Bank    Standing Status:   Future    Standing Expiration Date:   03/05/2019   All questions were answered. The patient knows to call the clinic with any problems, questions or concerns.      Cammie Sickle, MD 03/10/2018 1:29 PM

## 2018-03-04 NOTE — Progress Notes (Signed)
Pt reports "feeling pretty good today but other days not feeling very well, very fatigued".

## 2018-03-04 NOTE — Progress Notes (Signed)
1510: Pt tolerated transfusion well. Pt and VS stable discharge.

## 2018-03-05 LAB — BPAM RBC
BLOOD PRODUCT EXPIRATION DATE: 201911132359
ISSUE DATE / TIME: 201911061309
Unit Type and Rh: 5100

## 2018-03-05 LAB — TYPE AND SCREEN
ABO/RH(D): A POS
Antibody Screen: NEGATIVE
Unit division: 0

## 2018-03-17 ENCOUNTER — Other Ambulatory Visit: Payer: Self-pay | Admitting: *Deleted

## 2018-03-17 DIAGNOSIS — C9192 Lymphoid leukemia, unspecified, in relapse: Secondary | ICD-10-CM

## 2018-03-17 DIAGNOSIS — C9112 Chronic lymphocytic leukemia of B-cell type in relapse: Secondary | ICD-10-CM

## 2018-03-17 DIAGNOSIS — C8300 Small cell B-cell lymphoma, unspecified site: Secondary | ICD-10-CM

## 2018-03-17 MED ORDER — OXYCODONE-ACETAMINOPHEN 5-325 MG PO TABS: ORAL_TABLET | ORAL | 0 refills | Status: DC

## 2018-03-17 NOTE — Telephone Encounter (Signed)
Patient called cancer center requesting refill of Norco 5-325 mg q 8 hours PRN.   As mandated by the [place] STOP Act (Strengthen Opioid Misuse Prevention), the [place] Controlled Substance Reporting System ([place]) was reviewed for this patient.  Below is the past 71-months of controlled substance prescriptions as displayed by the registry.  I have personally consulted with my supervising physician,Dr. Rogue Bussing, who agrees that continuation of opiate therapy is medically appropriate at this time and agrees to provide continual monitoring, including urine/blood drug screens, as indicated. Refill is appropriate on or after 03/14/18.  NCCSRS reviewed:     Faythe Casa, NP 03/17/2018 2:57 PM [phone]

## 2018-03-18 ENCOUNTER — Inpatient Hospital Stay (HOSPITAL_BASED_OUTPATIENT_CLINIC_OR_DEPARTMENT_OTHER): Payer: Medicare Other | Admitting: Internal Medicine

## 2018-03-18 ENCOUNTER — Inpatient Hospital Stay: Payer: Medicare Other

## 2018-03-18 ENCOUNTER — Encounter: Payer: Self-pay | Admitting: Internal Medicine

## 2018-03-18 VITALS — BP 180/78 | HR 54 | Resp 16 | Wt 175.8 lb

## 2018-03-18 DIAGNOSIS — D649 Anemia, unspecified: Secondary | ICD-10-CM

## 2018-03-18 DIAGNOSIS — I10 Essential (primary) hypertension: Secondary | ICD-10-CM

## 2018-03-18 DIAGNOSIS — C9192 Lymphoid leukemia, unspecified, in relapse: Secondary | ICD-10-CM | POA: Diagnosis not present

## 2018-03-18 DIAGNOSIS — C9112 Chronic lymphocytic leukemia of B-cell type in relapse: Secondary | ICD-10-CM

## 2018-03-18 DIAGNOSIS — G8929 Other chronic pain: Secondary | ICD-10-CM

## 2018-03-18 DIAGNOSIS — F17210 Nicotine dependence, cigarettes, uncomplicated: Secondary | ICD-10-CM

## 2018-03-18 DIAGNOSIS — R1013 Epigastric pain: Secondary | ICD-10-CM

## 2018-03-18 DIAGNOSIS — R5383 Other fatigue: Secondary | ICD-10-CM

## 2018-03-18 LAB — CBC WITH DIFFERENTIAL/PLATELET
Abs Immature Granulocytes: 0 10*3/uL (ref 0.00–0.07)
Basophils Absolute: 0.1 10*3/uL (ref 0.0–0.1)
Basophils Relative: 2 %
EOS ABS: 0.3 10*3/uL (ref 0.0–0.5)
Eosinophils Relative: 3 %
HEMATOCRIT: 27.9 % — AB (ref 39.0–52.0)
HEMOGLOBIN: 8.4 g/dL — AB (ref 13.0–17.0)
Immature Granulocytes: 0 %
Lymphocytes Relative: 38 %
Lymphs Abs: 3.7 10*3/uL (ref 0.7–4.0)
MCH: 31.3 pg (ref 26.0–34.0)
MCHC: 30.1 g/dL (ref 30.0–36.0)
MCV: 104.1 fL — AB (ref 80.0–100.0)
MONO ABS: 2.8 10*3/uL — AB (ref 0.1–1.0)
MONOS PCT: 30 %
NEUTROS ABS: 2.6 10*3/uL (ref 1.7–7.7)
Neutrophils Relative %: 27 %
Platelets: 168 10*3/uL (ref 150–400)
RBC: 2.68 MIL/uL — AB (ref 4.22–5.81)
RDW: 20.1 % — AB (ref 11.5–15.5)
WBC: 9.6 10*3/uL (ref 4.0–10.5)
nRBC: 0 % (ref 0.0–0.2)

## 2018-03-18 LAB — COMPREHENSIVE METABOLIC PANEL
ALT: 11 U/L (ref 0–44)
AST: 11 U/L — ABNORMAL LOW (ref 15–41)
Albumin: 4.1 g/dL (ref 3.5–5.0)
Alkaline Phosphatase: 97 U/L (ref 38–126)
Anion gap: 7 (ref 5–15)
BUN: 18 mg/dL (ref 8–23)
CALCIUM: 8.8 mg/dL — AB (ref 8.9–10.3)
CHLORIDE: 108 mmol/L (ref 98–111)
CO2: 24 mmol/L (ref 22–32)
Creatinine, Ser: 1.08 mg/dL (ref 0.61–1.24)
GFR calc non Af Amer: >60 mL/min (ref >60)
Glucose, Bld: 140 mg/dL — ABNORMAL HIGH (ref 70–99)
Potassium: 4.9 mmol/L (ref 3.5–5.1)
Sodium: 139 mmol/L (ref 135–145)
Total Bilirubin: 0.6 mg/dL (ref 0.3–1.2)
Total Protein: 6.6 g/dL (ref 6.5–8.1)

## 2018-03-18 LAB — SAMPLE TO BLOOD BANK

## 2018-03-18 NOTE — Progress Notes (Signed)
[facility] OFFICE PROGRESS NOTE  Patient Care Team: [name], MD as PCP - General (Internal Medicine)  Cancer Staging No matching staging information was found for the patient.   Oncology History   # 2006- CLL STAGE IV; MAY 2011- WBC- 57K;Platelets-99;Hb-12/CT Bulky LN; BMBx- 80% Invol; del 11; START Benda-Ritux x4 [finished Sep 2011];   # July 2015-Progression; Sep 2015-START ibrutinib; CT scan DEC 2015- Improvement LN; Cont Ibrutinib 144m/d; NOV 2016 CT- 1-2CM LN [mild progression compared to Dec 2015];NOV 2016- FISH peripheral blood- NO MUTATIONS/CD-38 Positive; NOV 7th- CONT IBRUTINIB 2 pills/day; CT AUG 2017- STABLE;  DEC 6th PET- Mild RP LN/ Retrocrural LN   #October 2019-bone marrow biopsy [worsening anemia]-question dyserythropoietic changes/small clone of CLL; awaiting NGS testing  SA skin infection [Oct 2016] s/p clinda -------------------------------------------------------    DIAGNOSIS: CLL  STAGE:   IV      ;GOALS: palliative  CURRENT/MOST RECENT THERAPY : On hold since September 2019 ibrutinib      CLL (chronic lymphoid leukemia) in relapse ([place]    INTERVAL HISTORY:  James Zaremba89y.o.  male pleasant patient above history of CLL and chronic abdominal pain of unclear etiology; worsening anemia of unclear etiology is here for follow-up.  Patient received PRBC transfusion approximately 2 weeks ago when his hemoglobin was 7.2.  His energy level slightly improved.  He denies any blood in stools black or stools.  Continues to have mild to moderate fatigue.  He continues to be off ibrutinib.  Continues to have chronic abdominal pain for which he is on Percocet up to 2 to 3 pills a day.  Review of Systems  Constitutional: Positive for malaise/fatigue and weight loss. Negative for chills, diaphoresis and fever.  HENT: Negative for nosebleeds and sore throat.   Eyes: Negative for double vision.  Respiratory: Negative for cough,  hemoptysis, sputum production, shortness of breath and wheezing.   Cardiovascular: Negative for chest pain, palpitations, orthopnea and leg swelling.  Gastrointestinal: Positive for abdominal pain and nausea. Negative for blood in stool, constipation, diarrhea, heartburn, melena and vomiting.  Genitourinary: Negative for dysuria, frequency and urgency.  Skin: Negative.  Negative for itching and rash.  Neurological: Negative for dizziness, tingling, focal weakness, weakness and headaches.  Endo/Heme/Allergies: Does not bruise/bleed easily.  Psychiatric/Behavioral: Negative for depression. The patient is not nervous/anxious and does not have insomnia.       PAST MEDICAL HISTORY :  Past Medical History:  Diagnosis Date  . C[place] (ch[place]ocytic leukemia) ([place]   . Diabetes mellitus without complication ([place]   . Hematuria   . Hypertension     PAST SURGICAL HISTORY :   Past Surgical History:  Procedure Laterality Date  . CATARACT EXTRACTION W/ INTRAOCULAR LENS IMPLANT Bilateral   . CHOLECYSTECTOMY  1983  . COLONOSCOPY WITH PROPOFOL N/A 10/27/2017   Procedure: COLONOSCOPY WITH PROPOFOL;  Surgeon: EManya Silvas MD;  [facility]  Service: Endoscopy;  Laterality: N/A;  . ESOPHAGOGASTRODUODENOSCOPY (EGD) WITH PROPOFOL N/A 11/20/2016   Procedure: ESOPHAGOGASTRODUODENOSCOPY (EGD) WITH PROPOFOL;  Surgeon: EManya Silvas MD;  [facility];  Service: Endoscopy;  Laterality: N/A;  . ESOPHAGOGASTRODUODENOSCOPY (EGD) WITH PROPOFOL N/A 10/27/2017   Procedure: ESOPHAGOGASTRODUODENOSCOPY (EGD) WITH PROPOFOL;  Surgeon: EManya Silvas MD;  [facility];  Service: Endoscopy;  Laterality: N/A;  . LOWER EXTREMITY ANGIOGRAPHY Right 05/19/2017   Procedure: LOWER EXTREMITY ANGIOGRAPHY;  Surgeon: DAlgernon Huxley MD;  [facility];  Service: Cardiovascular;  Laterality: Right;    FAMILY HISTORY :   Family History  Problem Relation Age of Onset  .  Hypertension Sister     SOCIAL HISTORY:   Social History   Tobacco Use  . Smoking status: Current Every Day Smoker    Packs/day: 0.50    Years: 47.00    Pack years: 23.50    Types: Cigarettes  . Smokeless tobacco: Never Used  Substance Use Topics  . Alcohol use: Yes    Alcohol/week: 1.0 standard drinks    Types: 1 Glasses of wine per week    Comment:  almost none in last 6 months  . Drug use: No    ALLERGIES:  has No Known Allergies.  MEDICATIONS:  Current Outpatient Medications  Medication Sig Dispense Refill  . amLODipine (NORVASC) 10 MG tablet Take 1 tablet (10 mg total) by mouth daily. 30 tablet 3  . aspirin EC 81 MG tablet Take 81 mg by mouth daily.    Marland Kitchen atorvastatin (LIPITOR) 10 MG tablet Take 1 tablet (10 mg total) by mouth daily. 30 tablet 11  . clopidogrel (PLAVIX) 75 MG tablet Take 1 tablet (75 mg total) by mouth daily. 30 tablet 11  . IMBRUVICA 280 MG TABS TAKE 1 TABLET BY MOUTH DAILY. 28 tablet 6  . iron polysaccharides (NU-IRON) 150 MG capsule Take 150 mg by mouth daily.     Marland Kitchen linaclotide (LINZESS) 145 MCG CAPS capsule Take 1 capsule (145 mcg total) by mouth daily before breakfast. 30 capsule 3  . lisinopril (PRINIVIL,ZESTRIL) 30 MG tablet Take 30 mg by mouth daily.  1  . metFORMIN (GLUMETZA) 1000 MG (MOD) 24 hr tablet Take 1,000 mg by mouth 2 (two) times daily.    . metoprolol tartrate (LOPRESSOR) 25 MG tablet TAKE 1 TABLET BY MOUTH TWICE DAILY 180 tablet 1  . omeprazole (PRILOSEC) 40 MG capsule Take 40 mg by mouth daily.     Marland Kitchen oxyCODONE-acetaminophen (PERCOCET/ROXICET) 5-325 MG tablet 1 pill every 8-12 hours 90 tablet 0  . phenazopyridine (PYRIDIUM) 100 MG tablet Take 1 tablet (100 mg total) by mouth 3 (three) times daily as needed for pain. 30 tablet 0   No current facility-administered medications for this visit.     PHYSICAL EXAMINATION: ECOG PERFORMANCE STATUS: 1 - Symptomatic but completely ambulatory  BP (!) 180/78 (BP Location: Left Arm, Patient  Position: Sitting)   Pulse (!) 54   Resp 16   Wt 175 lb 12.8 oz (79.7 kg)   BMI 26.73 kg/m   Filed Weights   03/18/18 0946  Weight: 175 lb 12.8 oz (79.7 kg)    Physical Exam  Constitutional: He is oriented to person, place, and time and well-developed, well-nourished, and in no distress.  Is accompanied by his wife.  Appears pale.  HENT:  Head: Normocephalic and atraumatic.  Mouth/Throat: Oropharynx is clear and moist. No oropharyngeal exudate.  Eyes: Pupils are equal, round, and reactive to light.  Neck: Normal range of motion. Neck supple.  Cardiovascular: Normal rate and regular rhythm.  Pulmonary/Chest: No respiratory distress. He has no wheezes.  Decreased air entry bil.   Abdominal: Soft. Bowel sounds are normal. He exhibits no distension and no mass. There is no tenderness. There is no rebound and no guarding.  ? Positive for splenomegaly.   Musculoskeletal: Normal range of motion. He exhibits no edema or tenderness.  Neurological: He is alert and oriented to person, place, and time.  Skin: Skin is warm. There is pallor.  Psychiatric: Affect  normal.      LABORATORY DATA:  I have reviewed the data as listed    Component Value Date/Time   NA 139 03/18/2018 0920   NA 142 05/03/2014 1139   K 4.9 03/18/2018 0920   K 4.7 05/03/2014 1139   CL 108 03/18/2018 0920   CL 110 (H) 05/03/2014 1139   CO2 24 03/18/2018 0920   CO2 24 05/03/2014 1139   GLUCOSE 140 (H) 03/18/2018 0920   GLUCOSE 98 05/03/2014 1139   BUN 18 03/18/2018 0920   BUN 20 (H) 05/03/2014 1139   CREATININE 1.08 03/18/2018 0920   CREATININE 1.22 08/09/2014 1122   CALCIUM 8.8 (L) 03/18/2018 0920   CALCIUM 8.6 05/03/2014 1139   PROT 6.6 03/18/2018 0920   PROT 7.5 05/03/2014 1139   ALBUMIN 4.1 03/18/2018 0920   ALBUMIN 4.1 05/03/2014 1139   AST 11 (L) 03/18/2018 0920   AST 15 05/03/2014 1139   ALT 11 03/18/2018 0920   ALT 26 05/03/2014 1139   ALKPHOS 97 03/18/2018 0920   ALKPHOS 94 05/03/2014 1139    BILITOT 0.6 03/18/2018 0920   BILITOT 0.5 05/03/2014 1139   GFRNONAA >60 03/18/2018 0920   GFRNONAA >60 08/09/2014 1122   GFRAA >60 03/18/2018 0920   GFRAA >60 08/09/2014 1122    No results found for: SPEP, UPEP  Lab Results  Component Value Date   WBC 9.6 03/18/2018   NEUTROABS 2.6 03/18/2018   HGB 8.4 (L) 03/18/2018   HCT 27.9 (L) 03/18/2018   MCV 104.1 (H) 03/18/2018   PLT 168 03/18/2018      Chemistry      Component Value Date/Time   NA 139 03/18/2018 0920   NA 142 05/03/2014 1139   K 4.9 03/18/2018 0920   K 4.7 05/03/2014 1139   CL 108 03/18/2018 0920   CL 110 (H) 05/03/2014 1139   CO2 24 03/18/2018 0920   CO2 24 05/03/2014 1139   BUN 18 03/18/2018 0920   BUN 20 (H) 05/03/2014 1139   CREATININE 1.08 03/18/2018 0920   CREATININE 1.22 08/09/2014 1122      Component Value Date/Time   CALCIUM 8.8 (L) 03/18/2018 0920   CALCIUM 8.6 05/03/2014 1139   ALKPHOS 97 03/18/2018 0920   ALKPHOS 94 05/03/2014 1139   AST 11 (L) 03/18/2018 0920   AST 15 05/03/2014 1139   ALT 11 03/18/2018 0920   ALT 26 05/03/2014 1139   BILITOT 0.6 03/18/2018 0920   BILITOT 0.5 05/03/2014 1139       RADIOGRAPHIC STUDIES: I have personally reviewed the radiological images as listed and agreed with the findings in the report. No results found.   ASSESSMENT & PLAN:  CLL (chronic lymphoid leukemia) in relapse ([place]) # CLL/SLL- relapsed currently off ibrutinib [sec ton intolerance in sep 2019]; SEP 24th 2019-CT scan shows no progressive/worsening lymphadenopathy; however clinically worse [continue abdominal pain/weight loss/anemia]- STABLE.   # Anemia-hemoglobin- 8.2 today; s/p PRBC transfusion in 2 weeks ago;  worse; ? etiology CKD/no obvious evidence of iron deficiency.  Await MDS panel/NGS peripheral blood. HOLD transfusion today.   # HTN- poorly controlled- recommend compliance with medications.   # Abdominal pain/dyspepsia/unclear etiology; not improving. Continue percocet for now.    # DISPOSITION:  # NO transfusion today.  # follow up in 2 weeks/labs-HOLD tube/cbc/bmp/possible 1 unit of blood transfusion.     Orders Placed This Encounter  Procedures  . CBC with Differential/Platelet    Standing Status:   Future    Standing  Expiration Date:   03/19/2019  . Comprehensive metabolic panel    Standing Status:   Future    Standing Expiration Date:   03/19/2019  . Sample to Blood Bank    Standing Status:   Future    Standing Expiration Date:   03/19/2019   All questions were answered. The patient knows to call the clinic with any problems, questions or concerns.      Cammie Sickle, MD 03/18/2018 10:29 AM

## 2018-03-18 NOTE — Assessment & Plan Note (Addendum)
#   CLL/SLL- relapsed currently off ibrutinib [sec ton intolerance in sep 2019]; SEP 24th 2019-CT scan shows no progressive/worsening lymphadenopathy; however clinically worse [continue abdominal pain/weight loss/anemia]- STABLE.   # Anemia-hemoglobin- 8.2 today; s/p PRBC transfusion in 2 weeks ago;  worse; ? etiology CKD/no obvious evidence of iron deficiency.  Await MDS panel/NGS peripheral blood. HOLD transfusion today.   # HTN- poorly controlled- recommend compliance with medications.   # Abdominal pain/dyspepsia/unclear etiology; not improving. Continue percocet for now.   # DISPOSITION:  # NO transfusion today.  # follow up in 2 weeks/labs-HOLD tube/cbc/bmp/possible 1 unit of blood transfusion.

## 2018-03-25 MED FILL — IMBRUVICA 280 MG TAB: 280 | 28 days supply | Qty: 28 | Fill #4

## 2018-04-01 ENCOUNTER — Inpatient Hospital Stay: Payer: Medicare Other | Attending: Internal Medicine

## 2018-04-01 ENCOUNTER — Encounter: Payer: Self-pay | Admitting: Internal Medicine

## 2018-04-01 ENCOUNTER — Inpatient Hospital Stay: Payer: Medicare Other

## 2018-04-01 ENCOUNTER — Inpatient Hospital Stay (HOSPITAL_BASED_OUTPATIENT_CLINIC_OR_DEPARTMENT_OTHER): Payer: Medicare Other | Admitting: Internal Medicine

## 2018-04-01 ENCOUNTER — Other Ambulatory Visit: Payer: Self-pay

## 2018-04-01 VITALS — BP 176/72 | HR 50 | Resp 16 | Wt 181.1 lb

## 2018-04-01 DIAGNOSIS — Z79899 Other long term (current) drug therapy: Secondary | ICD-10-CM | POA: Diagnosis not present

## 2018-04-01 DIAGNOSIS — C9112 Chronic lymphocytic leukemia of B-cell type in relapse: Secondary | ICD-10-CM | POA: Diagnosis not present

## 2018-04-01 DIAGNOSIS — I10 Essential (primary) hypertension: Secondary | ICD-10-CM

## 2018-04-01 DIAGNOSIS — D649 Anemia, unspecified: Secondary | ICD-10-CM | POA: Diagnosis not present

## 2018-04-01 DIAGNOSIS — F17210 Nicotine dependence, cigarettes, uncomplicated: Secondary | ICD-10-CM

## 2018-04-01 DIAGNOSIS — R109 Unspecified abdominal pain: Secondary | ICD-10-CM

## 2018-04-01 DIAGNOSIS — C9192 Lymphoid leukemia, unspecified, in relapse: Secondary | ICD-10-CM

## 2018-04-01 LAB — CBC WITH DIFFERENTIAL/PLATELET
Abs Immature Granulocytes: 0.02 10*3/uL (ref 0.00–0.07)
BASOS ABS: 0.1 10*3/uL (ref 0.0–0.1)
Basophils Relative: 2 %
EOS ABS: 0.2 10*3/uL (ref 0.0–0.5)
EOS PCT: 2 %
HEMATOCRIT: 28.5 % — AB (ref 39.0–52.0)
HEMOGLOBIN: 8.7 g/dL — AB (ref 13.0–17.0)
Immature Granulocytes: 0 %
LYMPHS ABS: 4.5 10*3/uL — AB (ref 0.7–4.0)
LYMPHS PCT: 53 %
MCH: 32.2 pg (ref 26.0–34.0)
MCHC: 30.5 g/dL (ref 30.0–36.0)
MCV: 105.6 fL — AB (ref 80.0–100.0)
MONO ABS: 0.7 10*3/uL (ref 0.1–1.0)
MONOS PCT: 8 %
Neutro Abs: 3 10*3/uL (ref 1.7–7.7)
Neutrophils Relative %: 35 %
Platelets: 141 10*3/uL — ABNORMAL LOW (ref 150–400)
RBC: 2.7 MIL/uL — ABNORMAL LOW (ref 4.22–5.81)
RDW: 19.3 % — AB (ref 11.5–15.5)
WBC: 8.4 10*3/uL (ref 4.0–10.5)
nRBC: 0 % (ref 0.0–0.2)

## 2018-04-01 LAB — COMPREHENSIVE METABOLIC PANEL
ALBUMIN: 4.1 g/dL (ref 3.5–5.0)
ALK PHOS: 93 U/L (ref 38–126)
ALT: 11 U/L (ref 0–44)
AST: 13 U/L — AB (ref 15–41)
Anion gap: 7 (ref 5–15)
BUN: 17 mg/dL (ref 8–23)
CHLORIDE: 111 mmol/L (ref 98–111)
CO2: 25 mmol/L (ref 22–32)
CREATININE: 1.22 mg/dL (ref 0.61–1.24)
Calcium: 8.5 mg/dL — ABNORMAL LOW (ref 8.9–10.3)
GFR calc non Af Amer: >60 mL/min (ref >60)
GLUCOSE: 117 mg/dL — AB (ref 70–99)
Potassium: 4.2 mmol/L (ref 3.5–5.1)
SODIUM: 143 mmol/L (ref 135–145)
Total Bilirubin: 0.4 mg/dL (ref 0.3–1.2)
Total Protein: 6.8 g/dL (ref 6.5–8.1)

## 2018-04-01 MED ORDER — AMLODIPINE BESYLATE 10 MG PO TABS: 10.0000 mg | ORAL_TABLET | Freq: Every day | ORAL | 3 refills | Status: DC

## 2018-04-01 NOTE — Assessment & Plan Note (Addendum)
#   CLL/SLL- relapsed currently off ibrutinib [sec ton intolerance in sep 2019]; SEP 24th 2019-CT scan shows no progressive/worsening lymphadenopathy; however clinically worse [continue abdominal pain/weight loss/anemia]- STABLE.   # Anemia-hemoglobin- 8.7 today; STABLE. HOLD transfusion today. ? etiology CKD/no obvious evidence of iron deficiency.  Await MDS panel/NGS peripheral blood.  # HTN- poorly controlled-recommend adding norvasc 10 mg; in addition to linisopril/metoprolol.   # Abdominal pain/dyspepsia/unclear etiology; not improving. Continue percocet for now. Follow up in 2 weeks- new script the.   # DISPOSITION:  # NO transfusion today.  # follow up in 2 weeks/labs-HOLD tube/cbc/bmp/possible 1 unit of blood transfusion.

## 2018-04-01 NOTE — Progress Notes (Signed)
[facility] OFFICE PROGRESS NOTE  Patient Care Team: [name], MD as PCP - General (Internal Medicine)  Cancer Staging No matching staging information was found for the patient.   Oncology History   # 2006- CLL STAGE IV; MAY 2011- WBC- 57K;Platelets-99;Hb-12/CT Bulky LN; BMBx- 80% Invol; del 11; START Benda-Ritux x4 [finished Sep 2011];   # July 2015-Progression; Sep 2015-START ibrutinib; CT scan DEC 2015- Improvement LN; Cont Ibrutinib 141m/d; NOV 2016 CT- 1-2CM LN [mild progression compared to Dec 2015];NOV 2016- FISH peripheral blood- NO MUTATIONS/CD-38 Positive; NOV 7th- CONT IBRUTINIB 2 pills/day; CT AUG 2017- STABLE;  DEC 6th PET- Mild RP LN/ Retrocrural LN   #October 2019-bone marrow biopsy [worsening anemia]-question dyserythropoietic changes/small clone of CLL; awaiting NGS testing  SA skin infection [Oct 2016] s/p clinda -------------------------------------------------------    DIAGNOSIS: CLL  STAGE:   IV      ;GOALS: palliative  CURRENT/MOST RECENT THERAPY : On hold since September 2019 ibrutinib      CLL (chronic lymphoid leukemia) in relapse ([place]    INTERVAL HISTORY:  James Blase89y.o.  male pleasant patient above history of CLL [currently off ibrutinib] and chronic abdominal pain of unclear etiology; worsening anemia of unclear etiology is here for follow-up.  Patient's last blood transfusion was approximately 4 weeks ago.  He continues to complain of fatigue.  He continues to complain of chronic abdominal pain for which he is taking Percocet.  However patient's pain is improving.  His take up to 3 pills a day.  Positive weight loss.  No nausea vomiting.  Review of Systems  Constitutional: Positive for malaise/fatigue and weight loss. Negative for chills, diaphoresis and fever.  HENT: Negative for nosebleeds and sore throat.   Eyes: Negative for double vision.  Respiratory: Negative for cough, hemoptysis, sputum  production, shortness of breath and wheezing.   Cardiovascular: Negative for chest pain, palpitations, orthopnea and leg swelling.  Gastrointestinal: Positive for abdominal pain and nausea. Negative for blood in stool, constipation, diarrhea, heartburn, melena and vomiting.  Genitourinary: Negative for dysuria, frequency and urgency.  Skin: Negative.  Negative for itching and rash.  Neurological: Negative for dizziness, tingling, focal weakness, weakness and headaches.  Endo/Heme/Allergies: Does not bruise/bleed easily.  Psychiatric/Behavioral: Negative for depression. The patient is not nervous/anxious and does not have insomnia.       PAST MEDICAL HISTORY :  Past Medical History:  Diagnosis Date  . C[place] (ch[place]ocytic leukemia) ([place]   . Diabetes mellitus without complication ([place]   . Hematuria   . Hypertension     PAST SURGICAL HISTORY :   Past Surgical History:  Procedure Laterality Date  . CATARACT EXTRACTION W/ INTRAOCULAR LENS IMPLANT Bilateral   . CHOLECYSTECTOMY  1983  . COLONOSCOPY WITH PROPOFOL N/A 10/27/2017   Procedure: COLONOSCOPY WITH PROPOFOL;  Surgeon: EManya Silvas MD;  [facility];  Service: Endoscopy;  Laterality: N/A;  . ESOPHAGOGASTRODUODENOSCOPY (EGD) WITH PROPOFOL N/A 11/20/2016   Procedure: ESOPHAGOGASTRODUODENOSCOPY (EGD) WITH PROPOFOL;  Surgeon: EManya Silvas MD;  [facility];  Service: Endoscopy;  Laterality: N/A;  . ESOPHAGOGASTRODUODENOSCOPY (EGD) WITH PROPOFOL N/A 10/27/2017   Procedure: ESOPHAGOGASTRODUODENOSCOPY (EGD) WITH PROPOFOL;  Surgeon: EManya Silvas MD;  [facility];  Service: Endoscopy;  Laterality: N/A;  . LOWER EXTREMITY ANGIOGRAPHY Right 05/19/2017   Procedure: LOWER EXTREMITY ANGIOGRAPHY;  Surgeon: DAlgernon Huxley MD;  [facility];  Service: Cardiovascular;  Laterality: Right;    FAMILY HISTORY :  Family History  Problem Relation Age of Onset  . Hypertension  Sister     SOCIAL HISTORY:   Social History   Tobacco Use  . Smoking status: Current Every Day Smoker    Packs/day: 0.50    Years: 47.00    Pack years: 23.50    Types: Cigarettes  . Smokeless tobacco: Never Used  Substance Use Topics  . Alcohol use: Yes    Alcohol/week: 1.0 standard drinks    Types: 1 Glasses of wine per week    Comment:  almost none in last 6 months  . Drug use: No    ALLERGIES:  has No Known Allergies.  MEDICATIONS:  Current Outpatient Medications  Medication Sig Dispense Refill  . amLODipine (NORVASC) 10 MG tablet Take 1 tablet (10 mg total) by mouth daily. 30 tablet 3  . aspirin EC 81 MG tablet Take 81 mg by mouth daily.    Marland Kitchen atorvastatin (LIPITOR) 10 MG tablet Take 1 tablet (10 mg total) by mouth daily. 30 tablet 11  . clopidogrel (PLAVIX) 75 MG tablet Take 1 tablet (75 mg total) by mouth daily. 30 tablet 11  . IMBRUVICA 280 MG TABS TAKE 1 TABLET BY MOUTH DAILY. 28 tablet 6  . iron polysaccharides (NU-IRON) 150 MG capsule Take 150 mg by mouth daily.     Marland Kitchen linaclotide (LINZESS) 145 MCG CAPS capsule Take 1 capsule (145 mcg total) by mouth daily before breakfast. 30 capsule 3  . lisinopril (PRINIVIL,ZESTRIL) 30 MG tablet Take 30 mg by mouth daily.  1  . metFORMIN (GLUMETZA) 1000 MG (MOD) 24 hr tablet Take 1,000 mg by mouth 2 (two) times daily.    . metoprolol tartrate (LOPRESSOR) 25 MG tablet TAKE 1 TABLET BY MOUTH TWICE DAILY 180 tablet 1  . omeprazole (PRILOSEC) 40 MG capsule Take 40 mg by mouth daily.     Marland Kitchen oxyCODONE-acetaminophen (PERCOCET/ROXICET) 5-325 MG tablet 1 pill every 8-12 hours 90 tablet 0  . phenazopyridine (PYRIDIUM) 100 MG tablet Take 1 tablet (100 mg total) by mouth 3 (three) times daily as needed for pain. 30 tablet 0   No current facility-administered medications for this visit.     PHYSICAL EXAMINATION: ECOG PERFORMANCE STATUS: 1 - Symptomatic but completely ambulatory  BP (!) 176/72 (BP Location: Left Arm, Patient Position:  Sitting)   Pulse (!) 50   Resp 16   Wt 181 lb 1.6 oz (82.1 kg)   BMI 27.54 kg/m   Filed Weights   04/01/18 0928  Weight: 181 lb 1.6 oz (82.1 kg)    Physical Exam  Constitutional: He is oriented to person, place, and time and well-developed, well-nourished, and in no distress.  Is accompanied by his wife.  Appears pale.  HENT:  Head: Normocephalic and atraumatic.  Mouth/Throat: Oropharynx is clear and moist. No oropharyngeal exudate.  Eyes: Pupils are equal, round, and reactive to light.  Neck: Normal range of motion. Neck supple.  Cardiovascular: Normal rate and regular rhythm.  Pulmonary/Chest: No respiratory distress. He has no wheezes.  Decreased air entry bil.   Abdominal: Soft. Bowel sounds are normal. He exhibits no distension and no mass. There is no tenderness. There is no rebound and no guarding.  ? Positive for splenomegaly.   Musculoskeletal: Normal range of motion. He exhibits no edema or tenderness.  Neurological: He is alert and oriented to person, place, and time.  Skin: Skin is warm. There is pallor.  Psychiatric: Affect normal.      LABORATORY DATA:  I  have reviewed the data as listed    Component Value Date/Time   NA 143 04/01/2018 0905   NA 142 05/03/2014 1139   K 4.2 04/01/2018 0905   K 4.7 05/03/2014 1139   CL 111 04/01/2018 0905   CL 110 (H) 05/03/2014 1139   CO2 25 04/01/2018 0905   CO2 24 05/03/2014 1139   GLUCOSE 117 (H) 04/01/2018 0905   GLUCOSE 98 05/03/2014 1139   BUN 17 04/01/2018 0905   BUN 20 (H) 05/03/2014 1139   CREATININE 1.22 04/01/2018 0905   CREATININE 1.22 08/09/2014 1122   CALCIUM 8.5 (L) 04/01/2018 0905   CALCIUM 8.6 05/03/2014 1139   PROT 6.8 04/01/2018 0905   PROT 7.5 05/03/2014 1139   ALBUMIN 4.1 04/01/2018 0905   ALBUMIN 4.1 05/03/2014 1139   AST 13 (L) 04/01/2018 0905   AST 15 05/03/2014 1139   ALT 11 04/01/2018 0905   ALT 26 05/03/2014 1139   ALKPHOS 93 04/01/2018 0905   ALKPHOS 94 05/03/2014 1139   BILITOT  0.4 04/01/2018 0905   BILITOT 0.5 05/03/2014 1139   GFRNONAA >60 04/01/2018 0905   GFRNONAA >60 08/09/2014 1122   GFRAA >60 04/01/2018 0905   GFRAA >60 08/09/2014 1122    No results found for: SPEP, UPEP  Lab Results  Component Value Date   WBC 8.4 04/01/2018   NEUTROABS 3.0 04/01/2018   HGB 8.7 (L) 04/01/2018   HCT 28.5 (L) 04/01/2018   MCV 105.6 (H) 04/01/2018   PLT 141 (L) 04/01/2018      Chemistry      Component Value Date/Time   NA 143 04/01/2018 0905   NA 142 05/03/2014 1139   K 4.2 04/01/2018 0905   K 4.7 05/03/2014 1139   CL 111 04/01/2018 0905   CL 110 (H) 05/03/2014 1139   CO2 25 04/01/2018 0905   CO2 24 05/03/2014 1139   BUN 17 04/01/2018 0905   BUN 20 (H) 05/03/2014 1139   CREATININE 1.22 04/01/2018 0905   CREATININE 1.22 08/09/2014 1122      Component Value Date/Time   CALCIUM 8.5 (L) 04/01/2018 0905   CALCIUM 8.6 05/03/2014 1139   ALKPHOS 93 04/01/2018 0905   ALKPHOS 94 05/03/2014 1139   AST 13 (L) 04/01/2018 0905   AST 15 05/03/2014 1139   ALT 11 04/01/2018 0905   ALT 26 05/03/2014 1139   BILITOT 0.4 04/01/2018 0905   BILITOT 0.5 05/03/2014 1139       RADIOGRAPHIC STUDIES: I have personally reviewed the radiological images as listed and agreed with the findings in the report. No results found.   ASSESSMENT & PLAN:  CLL (chronic lymphoid leukemia) in relapse ([place]) # CLL/SLL- relapsed currently off ibrutinib [sec ton intolerance in sep 2019]; SEP 24th 2019-CT scan shows no progressive/worsening lymphadenopathy; however clinically worse [continue abdominal pain/weight loss/anemia]- STABLE.   # Anemia-hemoglobin- 8.7 today; STABLE. HOLD transfusion today. ? etiology CKD/no obvious evidence of iron deficiency.  Await MDS panel/NGS peripheral blood.  # HTN- poorly controlled-recommend adding norvasc 10 mg; in addition to linisopril/metoprolol.   # Abdominal pain/dyspepsia/unclear etiology; not improving. Continue percocet for now. Follow up in 2  weeks- new script the.   # DISPOSITION:  # NO transfusion today.  # follow up in 2 weeks/labs-HOLD tube/cbc/bmp/possible 1 unit of blood transfusion.     Orders Placed This Encounter  Procedures  . CBC with Differential/Platelet    Standing Status:   Future    Standing Expiration Date:   04/02/2019  .  Comprehensive metabolic panel    Standing Status:   Future    Standing Expiration Date:   04/02/2019  . Sample to Blood Bank    Standing Status:   Future    Standing Expiration Date:   04/02/2019   All questions were answered. The patient knows to call the clinic with any problems, questions or concerns.      Cammie Sickle, MD 04/01/2018 9:07 PM

## 2018-04-02 LAB — SAMPLE TO BLOOD BANK

## 2018-04-06 ENCOUNTER — Encounter: Payer: Self-pay | Admitting: Internal Medicine

## 2018-04-15 ENCOUNTER — Encounter: Payer: Self-pay | Admitting: Internal Medicine

## 2018-04-15 ENCOUNTER — Other Ambulatory Visit: Payer: Self-pay

## 2018-04-15 ENCOUNTER — Inpatient Hospital Stay (HOSPITAL_BASED_OUTPATIENT_CLINIC_OR_DEPARTMENT_OTHER): Payer: Medicare Other | Admitting: Internal Medicine

## 2018-04-15 ENCOUNTER — Inpatient Hospital Stay: Payer: Medicare Other

## 2018-04-15 VITALS — BP 160/81 | HR 47 | Temp 97.8°F | Wt 178.0 lb

## 2018-04-15 DIAGNOSIS — R109 Unspecified abdominal pain: Secondary | ICD-10-CM

## 2018-04-15 DIAGNOSIS — D649 Anemia, unspecified: Secondary | ICD-10-CM | POA: Diagnosis not present

## 2018-04-15 DIAGNOSIS — D631 Anemia in chronic kidney disease: Secondary | ICD-10-CM | POA: Insufficient documentation

## 2018-04-15 DIAGNOSIS — N183 Chronic kidney disease, stage 3 unspecified: Secondary | ICD-10-CM

## 2018-04-15 DIAGNOSIS — C9112 Chronic lymphocytic leukemia of B-cell type in relapse: Secondary | ICD-10-CM

## 2018-04-15 DIAGNOSIS — F17210 Nicotine dependence, cigarettes, uncomplicated: Secondary | ICD-10-CM

## 2018-04-15 DIAGNOSIS — I10 Essential (primary) hypertension: Secondary | ICD-10-CM | POA: Diagnosis not present

## 2018-04-15 DIAGNOSIS — Z79899 Other long term (current) drug therapy: Secondary | ICD-10-CM

## 2018-04-15 DIAGNOSIS — C8300 Small cell B-cell lymphoma, unspecified site: Secondary | ICD-10-CM

## 2018-04-15 DIAGNOSIS — C9192 Lymphoid leukemia, unspecified, in relapse: Secondary | ICD-10-CM

## 2018-04-15 DIAGNOSIS — D539 Nutritional anemia, unspecified: Secondary | ICD-10-CM | POA: Insufficient documentation

## 2018-04-15 LAB — COMPREHENSIVE METABOLIC PANEL
ALT: 11 U/L (ref 0–44)
ANION GAP: 8 (ref 5–15)
AST: 12 U/L — ABNORMAL LOW (ref 15–41)
Albumin: 4.2 g/dL (ref 3.5–5.0)
Alkaline Phosphatase: 92 U/L (ref 38–126)
BUN: 18 mg/dL (ref 8–23)
CO2: 25 mmol/L (ref 22–32)
Calcium: 8.9 mg/dL (ref 8.9–10.3)
Chloride: 106 mmol/L (ref 98–111)
Creatinine, Ser: 1.1 mg/dL (ref 0.61–1.24)
GFR calc Af Amer: >60 mL/min (ref >60)
GFR calc non Af Amer: >60 mL/min (ref >60)
Glucose, Bld: 111 mg/dL — ABNORMAL HIGH (ref 70–99)
Potassium: 4.4 mmol/L (ref 3.5–5.1)
SODIUM: 139 mmol/L (ref 135–145)
TOTAL PROTEIN: 6.9 g/dL (ref 6.5–8.1)
Total Bilirubin: 0.6 mg/dL (ref 0.3–1.2)

## 2018-04-15 LAB — SAMPLE TO BLOOD BANK

## 2018-04-15 LAB — CBC WITH DIFFERENTIAL/PLATELET
Abs Immature Granulocytes: 0.02 10*3/uL (ref 0.00–0.07)
Basophils Absolute: 0.1 10*3/uL (ref 0.0–0.1)
Basophils Relative: 1 %
EOS ABS: 0.1 10*3/uL (ref 0.0–0.5)
Eosinophils Relative: 2 %
HCT: 28.2 % — ABNORMAL LOW (ref 39.0–52.0)
HEMOGLOBIN: 8.7 g/dL — AB (ref 13.0–17.0)
Immature Granulocytes: 0 %
Lymphocytes Relative: 42 %
Lymphs Abs: 3.5 10*3/uL (ref 0.7–4.0)
MCH: 33 pg (ref 26.0–34.0)
MCHC: 30.9 g/dL (ref 30.0–36.0)
MCV: 106.8 fL — ABNORMAL HIGH (ref 80.0–100.0)
Monocytes Absolute: 1.6 10*3/uL — ABNORMAL HIGH (ref 0.1–1.0)
Monocytes Relative: 20 %
Neutro Abs: 2.9 10*3/uL (ref 1.7–7.7)
Neutrophils Relative %: 35 %
Platelets: 135 10*3/uL — ABNORMAL LOW (ref 150–400)
RBC: 2.64 MIL/uL — ABNORMAL LOW (ref 4.22–5.81)
RDW: 18.3 % — ABNORMAL HIGH (ref 11.5–15.5)
Smear Review: NORMAL
WBC: 8.3 10*3/uL (ref 4.0–10.5)
nRBC: 0 % (ref 0.0–0.2)

## 2018-04-15 MED ORDER — OXYCODONE-ACETAMINOPHEN 5-325 MG PO TABS: ORAL_TABLET | ORAL | 0 refills | Status: DC

## 2018-04-15 NOTE — Progress Notes (Signed)
[facility] [place] OFFICE PROGRESS NOTE  Patient Care Team: [name], MD as PCP - General (Internal Medicine)  Cancer Staging No matching staging information was found for the patient.   Oncology History   # 2006- CLL STAGE IV; MAY 2011- WBC- 57K;Platelets-99;Hb-12/CT Bulky LN; BMBx- 80% Invol; del 11; START Benda-Ritux x4 [finished Sep 2011];   # July 2015-Progression; Sep 2015-START ibrutinib; CT scan DEC 2015- Improvement LN; Cont Ibrutinib 124m/d; NOV 2016 CT- 1-2CM LN [mild progression compared to Dec 2015];NOV 2016- FISH peripheral blood- NO MUTATIONS/CD-38 Positive; NOV 7th- CONT IBRUTINIB 2 pills/day; CT AUG 2017- STABLE;  DEC 6th PET- Mild RP LN/ Retrocrural LN   #October 2019-bone marrow biopsy [worsening anemia]-question dyserythropoietic changes/small clone of CLL;  # [facility] H[facility]- NEG.  SA skin infection [Oct 2016] s/p clinda -------------------------------------------------------    DIAGNOSIS: CLL  STAGE:   IV      ;GOALS: palliative  CURRENT/MOST RECENT THERAPY : On hold since September 2019 ibrutinib      CLL (chronic lymphoid leukemia) in relapse ([facility]    INTERVAL HISTORY:  [name]89y.o.  male pleasant patient above history of CLL [currently off ibrutinib] and chronic abdominal pain of unclear etiology; worsening anemia of unclear etiology is here for follow-up.  Patient had blood transfusion approximately 2 weeks ago.  Continues to complain of fatigue.   No nausea no vomiting.  Continues to have chronic abdominal pain for which he is taking Percocet.  He is taking up to 3 pills a day.  No blood in stools or black or stools.  Review of Systems  Constitutional: Positive for malaise/fatigue and weight loss. Negative for chills, diaphoresis and fever.  HENT: Negative for nosebleeds and sore throat.   Eyes: Negative for double vision.  Respiratory: Negative for cough, hemoptysis, sputum production, shortness of  breath and wheezing.   Cardiovascular: Negative for chest pain, palpitations, orthopnea and leg swelling.  Gastrointestinal: Positive for abdominal pain and nausea. Negative for blood in stool, constipation, diarrhea, heartburn, melena and vomiting.  Genitourinary: Negative for dysuria, frequency and urgency.  Skin: Negative.  Negative for itching and rash.  Neurological: Negative for dizziness, tingling, focal weakness, weakness and headaches.  Endo/Heme/Allergies: Does not bruise/bleed easily.  Psychiatric/Behavioral: Negative for depression. The patient is not nervous/anxious and does not have insomnia.       PAST MEDICAL HISTORY :  Past Medical History:  Diagnosis Date  . CLL (chronic lymphocytic leukemia) ([place]   . Diabetes mellitus without complication ([place]   . Hematuria   . Hypertension     PAST SURGICAL HISTORY :   Past Surgical History:  Procedure Laterality Date  . CATARACT EXTRACTION W/ INTRAOCULAR LENS IMPLANT Bilateral   . CHOLECYSTECTOMY  1983  . COLONOSCOPY WITH PROPOFOL N/A 10/27/2017   Procedure: COLONOSCOPY WITH PROPOFOL;  Surgeon: EManya Silvas MD;  [facility];  Service: Endoscopy;  Laterality: N/A;  . ESOPHAGOGASTRODUODENOSCOPY (EGD) WITH PROPOFOL N/A 11/20/2016   Procedure: ESOPHAGOGASTRODUODENOSCOPY (EGD) WITH PROPOFOL;  Surgeon: EManya Silvas MD;  [facility];  Service: Endoscopy;  Laterality: N/A;  . ESOPHAGOGASTRODUODENOSCOPY (EGD) WITH PROPOFOL N/A 10/27/2017   Procedure: ESOPHAGOGASTRODUODENOSCOPY (EGD) WITH PROPOFOL;  Surgeon: EManya Silvas MD;  [facility];  Service: Endoscopy;  Laterality: N/A;  . LOWER EXTREMITY ANGIOGRAPHY Right 05/19/2017   Procedure: LOWER EXTREMITY ANGIOGRAPHY;  Surgeon: DAlgernon Huxley MD;  [facility];  Service: Cardiovascular;  Laterality: Right;    FAMILY HISTORY :  Family History  Problem Relation Age of Onset  . Hypertension Sister     SOCIAL HISTORY:    Social History   Tobacco Use  . Smoking status: Current Every Day Smoker    Packs/day: 0.50    Years: 47.00    Pack years: 23.50    Types: Cigarettes  . Smokeless tobacco: Never Used  Substance Use Topics  . Alcohol use: Yes    Alcohol/week: 1.0 standard drinks    Types: 1 Glasses of wine per week    Comment:  almost none in last 6 months  . Drug use: No    ALLERGIES:  has No Known Allergies.  MEDICATIONS:  Current Outpatient Medications  Medication Sig Dispense Refill  . amLODipine (NORVASC) 10 MG tablet Take 1 tablet (10 mg total) by mouth daily. 30 tablet 3  . aspirin EC 81 MG tablet Take 81 mg by mouth daily.    Marland Kitchen atorvastatin (LIPITOR) 10 MG tablet Take 1 tablet (10 mg total) by mouth daily. 30 tablet 11  . clopidogrel (PLAVIX) 75 MG tablet Take 1 tablet (75 mg total) by mouth daily. 30 tablet 11  . IMBRUVICA 280 MG TABS TAKE 1 TABLET BY MOUTH DAILY. 28 tablet 6  . iron polysaccharides (NU-IRON) 150 MG capsule Take 150 mg by mouth daily.     Marland Kitchen linaclotide (LINZESS) 145 MCG CAPS capsule Take 1 capsule (145 mcg total) by mouth daily before breakfast. 30 capsule 3  . lisinopril (PRINIVIL,ZESTRIL) 30 MG tablet Take 30 mg by mouth daily.  1  . metFORMIN (GLUMETZA) 1000 MG (MOD) 24 hr tablet Take 1,000 mg by mouth 2 (two) times daily.    . metoprolol tartrate (LOPRESSOR) 25 MG tablet TAKE 1 TABLET BY MOUTH TWICE DAILY 180 tablet 1  . omeprazole (PRILOSEC) 40 MG capsule Take 40 mg by mouth daily.     Marland Kitchen oxyCODONE-acetaminophen (PERCOCET/ROXICET) 5-325 MG tablet 1 pill every 8-12 hours 90 tablet 0  . phenazopyridine (PYRIDIUM) 100 MG tablet Take 1 tablet (100 mg total) by mouth 3 (three) times daily as needed for pain. 30 tablet 0   No current facility-administered medications for this visit.     PHYSICAL EXAMINATION: ECOG PERFORMANCE STATUS: 1 - Symptomatic but completely ambulatory  BP (!) 160/81 (BP Location: Left Arm, Patient Position: Sitting, Cuff Size: Normal)   Pulse  (!) 47   Temp 97.8 F (36.6 C) (Tympanic)   Wt 178 lb (80.7 kg)   BMI 27.06 kg/m   Filed Weights   04/15/18 0952  Weight: 178 lb (80.7 kg)    Physical Exam  Constitutional: He is oriented to person, place, and time and well-developed, well-nourished, and in no distress.  Is accompanied by his wife.  Appears pale.  HENT:  Head: Normocephalic and atraumatic.  Mouth/Throat: Oropharynx is clear and moist. No oropharyngeal exudate.  Eyes: Pupils are equal, round, and reactive to light.  Neck: Normal range of motion. Neck supple.  Cardiovascular: Normal rate and regular rhythm.  Pulmonary/Chest: No respiratory distress. He has no wheezes.  Decreased air entry bil.   Abdominal: Soft. Bowel sounds are normal. He exhibits no distension and no mass. There is no abdominal tenderness. There is no rebound and no guarding.  Musculoskeletal: Normal range of motion.        General: No tenderness or edema.  Neurological: He is alert and oriented to person, place, and time.  Skin: Skin is warm. There is pallor.  Psychiatric: Affect normal.  LABORATORY DATA:  I have reviewed the data as listed    Component Value Date/Time   NA 139 04/15/2018 0919   NA 142 05/03/2014 1139   K 4.4 04/15/2018 0919   K 4.7 05/03/2014 1139   CL 106 04/15/2018 0919   CL 110 (H) 05/03/2014 1139   CO2 25 04/15/2018 0919   CO2 24 05/03/2014 1139   GLUCOSE 111 (H) 04/15/2018 0919   GLUCOSE 98 05/03/2014 1139   BUN 18 04/15/2018 0919   BUN 20 (H) 05/03/2014 1139   CREATININE 1.10 04/15/2018 0919   CREATININE 1.22 08/09/2014 1122   CALCIUM 8.9 04/15/2018 0919   CALCIUM 8.6 05/03/2014 1139   PROT 6.9 04/15/2018 0919   PROT 7.5 05/03/2014 1139   ALBUMIN 4.2 04/15/2018 0919   ALBUMIN 4.1 05/03/2014 1139   AST 12 (L) 04/15/2018 0919   AST 15 05/03/2014 1139   ALT 11 04/15/2018 0919   ALT 26 05/03/2014 1139   ALKPHOS 92 04/15/2018 0919   ALKPHOS 94 05/03/2014 1139   BILITOT 0.6 04/15/2018 0919    BILITOT 0.5 05/03/2014 1139   GFRNONAA >60 04/15/2018 0919   GFRNONAA >60 08/09/2014 1122   GFRAA >60 04/15/2018 0919   GFRAA >60 08/09/2014 1122    No results found for: SPEP, UPEP  Lab Results  Component Value Date   WBC 8.3 04/15/2018   NEUTROABS 2.9 04/15/2018   HGB 8.7 (L) 04/15/2018   HCT 28.2 (L) 04/15/2018   MCV 106.8 (H) 04/15/2018   PLT 135 (L) 04/15/2018      Chemistry      Component Value Date/Time   NA 139 04/15/2018 0919   NA 142 05/03/2014 1139   K 4.4 04/15/2018 0919   K 4.7 05/03/2014 1139   CL 106 04/15/2018 0919   CL 110 (H) 05/03/2014 1139   CO2 25 04/15/2018 0919   CO2 24 05/03/2014 1139   BUN 18 04/15/2018 0919   BUN 20 (H) 05/03/2014 1139   CREATININE 1.10 04/15/2018 0919   CREATININE 1.22 08/09/2014 1122      Component Value Date/Time   CALCIUM 8.9 04/15/2018 0919   CALCIUM 8.6 05/03/2014 1139   ALKPHOS 92 04/15/2018 0919   ALKPHOS 94 05/03/2014 1139   AST 12 (L) 04/15/2018 0919   AST 15 05/03/2014 1139   ALT 11 04/15/2018 0919   ALT 26 05/03/2014 1139   BILITOT 0.6 04/15/2018 0919   BILITOT 0.5 05/03/2014 1139       RADIOGRAPHIC STUDIES: I have personally reviewed the radiological images as listed and agreed with the findings in the report. No results found.   ASSESSMENT & PLAN:  CLL (chronic lymphoid leukemia) in relapse ([place]) # CLL/SLL- relapsed currently off ibrutinib [sec ton intolerance in sep 2019]; SEP 24th 2019-CT scan shows no progressive/worsening lymphadenopathy; However worsening anemia [see below]  # Anemia-hemoglobin- 8.7 today; STABLE. HOLD transfusion today.  Unclear etiology- ? CKD/no obvious evidence of iron deficiency.  Foundation 1 heme/NGS negative for any deleterious mutations/alterations.  Recommend use of Aranesp subcu every 2 weeks.   # Discussed use of erythropoietin stimulating agents like Aranesp to stimulate the bone marrow.  Discussed the potential issues with erythropoietin estimating agents-given the  risk of stroke thromboembolic events/elevated blood pressure.  However, most of the serious events did not happen when the goal hematocrit is 33/hemoglobin 30.   # HTN- poorly controlled-recommend adding norvasc 10 mg; in addition to linisopril/metoprolol.   # Abdominal pain/dyspepsia/unclear etiology; not improving. Continue percocet for now;  new script given.  Will discuss drug screen at next visit.  # DISPOSITION:  # NO transfusion today.  # jan 2nd- labs- H&H; hold tube-possible aranesp SQ # follow up-MD- jan 16th-;hold tube cbc/bmp/possible ananesp SQ-Dr.B    Orders Placed This Encounter  Procedures  . Hematocrit ([facility])    Standing Status:   Future    Standing Expiration Date:   04/16/2019  . Hemoglobin [facility])    Standing Status:   Future    Standing Expiration Date:   04/16/2019  . CBC with Differential/Platelet    Standing Status:   Future    Standing Expiration Date:   04/16/2019  . Comprehensive metabolic panel    Standing Status:   Future    Standing Expiration Date:   04/16/2019  . Sample to Blood Bank    Standing Status:   Future    Standing Expiration Date:   04/16/2019  . Sample to Blood Bank    Standing Status:   Future    Standing Expiration Date:   04/16/2019   All questions were answered. The patient knows to call the clinic with any problems, questions or concerns.      Cammie Sickle, MD 04/15/2018 5:09 PM

## 2018-04-15 NOTE — Assessment & Plan Note (Addendum)
#   CLL/SLL- relapsed currently off ibrutinib [sec ton intolerance in sep 2019]; SEP 24th 2019-CT scan shows no progressive/worsening lymphadenopathy; However worsening anemia [see below]  # Anemia-hemoglobin- 8.7 today; STABLE. HOLD transfusion today.  Unclear etiology- ? CKD/no obvious evidence of iron deficiency.  Foundation 1 heme/NGS negative for any deleterious mutations/alterations.  Recommend use of Aranesp subcu every 2 weeks.   # Discussed use of erythropoietin stimulating agents like Aranesp to stimulate the bone marrow.  Discussed the potential issues with erythropoietin estimating agents-given the risk of stroke thromboembolic events/elevated blood pressure.  However, most of the serious events did not happen when the goal hematocrit is 33/hemoglobin 30.   # HTN- poorly controlled-recommend adding norvasc 10 mg; in addition to linisopril/metoprolol.   # Abdominal pain/dyspepsia/unclear etiology; not improving. Continue percocet for now; new script given.  Will discuss drug screen at next visit.  # DISPOSITION:  # NO transfusion today.  # jan 2nd- labs- H&H; hold tube-possible aranesp SQ # follow up-MD- jan 16th-;hold tube cbc/bmp/possible ananesp SQ-Dr.B

## 2018-04-17 MED FILL — IMBRUVICA 280 MG TAB: 280 | 28 days supply | Qty: 28 | Fill #5

## 2018-04-30 ENCOUNTER — Inpatient Hospital Stay: Payer: Medicare Other

## 2018-04-30 ENCOUNTER — Inpatient Hospital Stay: Payer: Medicare Other | Attending: Internal Medicine

## 2018-04-30 ENCOUNTER — Other Ambulatory Visit: Payer: Self-pay

## 2018-04-30 VITALS — BP 150/73 | HR 52

## 2018-04-30 DIAGNOSIS — N183 Chronic kidney disease, stage 3 (moderate): Principal | ICD-10-CM

## 2018-04-30 DIAGNOSIS — R109 Unspecified abdominal pain: Secondary | ICD-10-CM | POA: Insufficient documentation

## 2018-04-30 DIAGNOSIS — F1190 Opioid use, unspecified, uncomplicated: Secondary | ICD-10-CM | POA: Insufficient documentation

## 2018-04-30 DIAGNOSIS — C9112 Chronic lymphocytic leukemia of B-cell type in relapse: Secondary | ICD-10-CM

## 2018-04-30 DIAGNOSIS — C9192 Lymphoid leukemia, unspecified, in relapse: Secondary | ICD-10-CM

## 2018-04-30 DIAGNOSIS — Z7289 Other problems related to lifestyle: Secondary | ICD-10-CM | POA: Insufficient documentation

## 2018-04-30 DIAGNOSIS — I129 Hypertensive chronic kidney disease with stage 1 through stage 4 chronic kidney disease, or unspecified chronic kidney disease: Secondary | ICD-10-CM | POA: Diagnosis present

## 2018-04-30 DIAGNOSIS — N189 Chronic kidney disease, unspecified: Secondary | ICD-10-CM | POA: Diagnosis present

## 2018-04-30 DIAGNOSIS — F17210 Nicotine dependence, cigarettes, uncomplicated: Secondary | ICD-10-CM | POA: Insufficient documentation

## 2018-04-30 DIAGNOSIS — D649 Anemia, unspecified: Secondary | ICD-10-CM | POA: Diagnosis present

## 2018-04-30 DIAGNOSIS — D631 Anemia in chronic kidney disease: Secondary | ICD-10-CM

## 2018-04-30 DIAGNOSIS — G8929 Other chronic pain: Secondary | ICD-10-CM | POA: Insufficient documentation

## 2018-04-30 LAB — HEMATOCRIT: HCT: 29.1 % — ABNORMAL LOW (ref 39.0–52.0)

## 2018-04-30 LAB — HEMOGLOBIN: Hemoglobin: 8.9 g/dL — ABNORMAL LOW (ref 13.0–17.0)

## 2018-04-30 LAB — SAMPLE TO BLOOD BANK

## 2018-04-30 MED ORDER — DARBEPOETIN ALFA 200 MCG/0.4ML IJ SOSY
200.0000 ug | PREFILLED_SYRINGE | Freq: Once | INTRAMUSCULAR | Status: AC
  Administered 2018-04-30: 200 ug via SUBCUTANEOUS
  Filled 2018-04-30: qty 0.4

## 2018-05-13 MED FILL — IMBRUVICA 280 MG TAB: 280 | 28 days supply | Qty: 28 | Fill #6

## 2018-05-14 ENCOUNTER — Inpatient Hospital Stay: Payer: Medicare Other

## 2018-05-14 ENCOUNTER — Encounter: Payer: Self-pay | Admitting: Internal Medicine

## 2018-05-14 ENCOUNTER — Other Ambulatory Visit: Payer: Self-pay

## 2018-05-14 ENCOUNTER — Inpatient Hospital Stay (HOSPITAL_BASED_OUTPATIENT_CLINIC_OR_DEPARTMENT_OTHER): Payer: Medicare Other | Admitting: Internal Medicine

## 2018-05-14 VITALS — BP 121/67 | HR 43 | Temp 97.7°F | Resp 16 | Ht 68.0 in | Wt 177.1 lb

## 2018-05-14 DIAGNOSIS — I129 Hypertensive chronic kidney disease with stage 1 through stage 4 chronic kidney disease, or unspecified chronic kidney disease: Secondary | ICD-10-CM | POA: Diagnosis not present

## 2018-05-14 DIAGNOSIS — G8929 Other chronic pain: Secondary | ICD-10-CM

## 2018-05-14 DIAGNOSIS — C9192 Lymphoid leukemia, unspecified, in relapse: Secondary | ICD-10-CM | POA: Diagnosis not present

## 2018-05-14 DIAGNOSIS — D649 Anemia, unspecified: Secondary | ICD-10-CM

## 2018-05-14 DIAGNOSIS — R1013 Epigastric pain: Secondary | ICD-10-CM

## 2018-05-14 DIAGNOSIS — N189 Chronic kidney disease, unspecified: Secondary | ICD-10-CM

## 2018-05-14 DIAGNOSIS — F17210 Nicotine dependence, cigarettes, uncomplicated: Secondary | ICD-10-CM

## 2018-05-14 DIAGNOSIS — E119 Type 2 diabetes mellitus without complications: Secondary | ICD-10-CM

## 2018-05-14 DIAGNOSIS — D631 Anemia in chronic kidney disease: Secondary | ICD-10-CM

## 2018-05-14 DIAGNOSIS — N183 Chronic kidney disease, stage 3 unspecified: Secondary | ICD-10-CM

## 2018-05-14 DIAGNOSIS — C8300 Small cell B-cell lymphoma, unspecified site: Secondary | ICD-10-CM

## 2018-05-14 DIAGNOSIS — C9112 Chronic lymphocytic leukemia of B-cell type in relapse: Secondary | ICD-10-CM

## 2018-05-14 DIAGNOSIS — I10 Essential (primary) hypertension: Secondary | ICD-10-CM

## 2018-05-14 LAB — CBC WITH DIFFERENTIAL/PLATELET
ABS IMMATURE GRANULOCYTES: 0.03 10*3/uL (ref 0.00–0.07)
Basophils Absolute: 0.1 10*3/uL (ref 0.0–0.1)
Basophils Relative: 1 %
Eosinophils Absolute: 0.1 10*3/uL (ref 0.0–0.5)
Eosinophils Relative: 1 %
HCT: 29.1 % — ABNORMAL LOW (ref 39.0–52.0)
Hemoglobin: 8.7 g/dL — ABNORMAL LOW (ref 13.0–17.0)
Immature Granulocytes: 0 %
Lymphocytes Relative: 54 %
Lymphs Abs: 5.1 10*3/uL — ABNORMAL HIGH (ref 0.7–4.0)
MCH: 32.3 pg (ref 26.0–34.0)
MCHC: 29.9 g/dL — ABNORMAL LOW (ref 30.0–36.0)
MCV: 108.2 fL — ABNORMAL HIGH (ref 80.0–100.0)
Monocytes Absolute: 1.5 10*3/uL — ABNORMAL HIGH (ref 0.1–1.0)
Monocytes Relative: 16 %
NEUTROS ABS: 2.7 10*3/uL (ref 1.7–7.7)
Neutrophils Relative %: 28 %
Platelets: 126 10*3/uL — ABNORMAL LOW (ref 150–400)
RBC: 2.69 MIL/uL — ABNORMAL LOW (ref 4.22–5.81)
RDW: 16.9 % — ABNORMAL HIGH (ref 11.5–15.5)
WBC: 9.5 10*3/uL (ref 4.0–10.5)
nRBC: 0 % (ref 0.0–0.2)

## 2018-05-14 LAB — COMPREHENSIVE METABOLIC PANEL
ALT: 7 U/L (ref 0–44)
AST: 12 U/L — ABNORMAL LOW (ref 15–41)
Albumin: 4.3 g/dL (ref 3.5–5.0)
Alkaline Phosphatase: 90 U/L (ref 38–126)
Anion gap: 8 (ref 5–15)
BUN: 22 mg/dL (ref 8–23)
CO2: 26 mmol/L (ref 22–32)
Calcium: 8.8 mg/dL — ABNORMAL LOW (ref 8.9–10.3)
Chloride: 110 mmol/L (ref 98–111)
Creatinine, Ser: 1.35 mg/dL — ABNORMAL HIGH (ref 0.61–1.24)
GFR calc Af Amer: >60 mL/min (ref >60)
GFR calc non Af Amer: 53 mL/min — ABNORMAL LOW (ref >60)
Glucose, Bld: 125 mg/dL — ABNORMAL HIGH (ref 70–99)
Potassium: 4.5 mmol/L (ref 3.5–5.1)
Sodium: 144 mmol/L (ref 135–145)
Total Bilirubin: 0.6 mg/dL (ref 0.3–1.2)
Total Protein: 6.8 g/dL (ref 6.5–8.1)

## 2018-05-14 LAB — SAMPLE TO BLOOD BANK

## 2018-05-14 MED ORDER — OXYCODONE-ACETAMINOPHEN 5-325 MG PO TABS: ORAL_TABLET | ORAL | 0 refills | Status: DC

## 2018-05-14 MED ORDER — DARBEPOETIN ALFA 200 MCG/0.4ML IJ SOSY
200.0000 ug | PREFILLED_SYRINGE | Freq: Once | INTRAMUSCULAR | Status: AC
  Administered 2018-05-14: 200 ug via SUBCUTANEOUS
  Filled 2018-05-14: qty 0.4

## 2018-05-14 NOTE — Patient Instructions (Signed)
#   re-start take Ibrutinib one pill a day.

## 2018-05-14 NOTE — Progress Notes (Signed)
[facility] [place] OFFICE PROGRESS NOTE  Patient Care Team: [name], MD as PCP - General (Internal Medicine)  Cancer Staging No matching staging information was found for the patient.   Oncology History   # 2006- CLL STAGE IV; MAY 2011- WBC- 57K;Platelets-99;Hb-12/CT Bulky LN; BMBx- 80% Invol; del 11; START Benda-Ritux x4 [finished Sep 2011];   # July 2015-Progression; Sep 2015-START ibrutinib; CT scan DEC 2015- Improvement LN; Cont Ibrutinib 118m/d; NOV 2016 CT- 1-2CM LN [mild progression compared to Dec 2015];NOV 2016- FISH peripheral blood- NO MUTATIONS/CD-38 Positive; NOV 7th- CONT IBRUTINIB 2 pills/day; CT AUG 2017- STABLE;  DEC 6th PET- Mild RP LN/ Retrocrural LN  # OFF ibrutinib [? intol]- sep 2019- Jan 16th 2020; Re-start Ibrutinib   # Jan 16th 2020- start aranesp  #October 2019-bone marrow biopsy [worsening anemia]-question dyserythropoietic changes/small clone of CLL;  # [facility] HEM- NEG.  SA skin infection [Oct 2016] s/p clinda -------------------------------------------------------    DIAGNOSIS: CLL  STAGE:   IV      ;GOALS: palliative  CURRENT/MOST RECENT THERAPY : On hold since September 2019 ibrutinib      CLL (chronic lymphoid leukemia) in relapse ([facility]    INTERVAL HISTORY:  James Blase67y.o.  male pleasant patient above history of CLL [currently off ibrutinib] and chronic abdominal pain of unclear etiology; worsening anemia of unclear etiology is here for follow-up.  Continues to chronic fatigue.  Poor appetite.  Chronic intermittent abdominal pain for which he is taking Percocet up to 3 pills a day.  No blood in stools no black or stools.  Last transfusion was approximately 4 to 6 weeks ago.  Review of Systems  Constitutional: Positive for malaise/fatigue and weight loss. Negative for chills, diaphoresis and fever.  HENT: Negative for nosebleeds and sore throat.   Eyes: Negative for double vision.  Respiratory:  Negative for cough, hemoptysis, sputum production, shortness of breath and wheezing.   Cardiovascular: Negative for chest pain, palpitations, orthopnea and leg swelling.  Gastrointestinal: Positive for abdominal pain and nausea. Negative for blood in stool, constipation, diarrhea, heartburn, melena and vomiting.  Genitourinary: Negative for dysuria, frequency and urgency.  Skin: Negative.  Negative for itching and rash.  Neurological: Negative for dizziness, tingling, focal weakness, weakness and headaches.  Endo/Heme/Allergies: Does not bruise/bleed easily.  Psychiatric/Behavioral: Negative for depression. The patient is not nervous/anxious and does not have insomnia.       PAST MEDICAL HISTORY :  Past Medical History:  Diagnosis Date  . [place]) [place]   . Diabetes mellitus without complication ([place]   . Hematuria   . Hypertension     PAST SURGICAL HISTORY :   Past Surgical History:  Procedure Laterality Date  . CATARACT EXTRACTION W/ INTRAOCULAR LENS IMPLANT Bilateral   . CHOLECYSTECTOMY  1983  . COLONOSCOPY WITH PROPOFOL N/A 10/27/2017   Procedure: COLONOSCOPY WITH PROPOFOL;  Surgeon: EManya Silvas MD;  [facility];  Service: Endoscopy;  Laterality: N/A;  . ESOPHAGOGASTRODUODENOSCOPY (EGD) WITH PROPOFOL N/A 11/20/2016   Procedure: ESOPHAGOGASTRODUODENOSCOPY (EGD) WITH PROPOFOL;  Surgeon: EManya Silvas MD;  [facility];  Service: Endoscopy;  Laterality: N/A;  . ESOPHAGOGASTRODUODENOSCOPY (EGD) WITH PROPOFOL N/A 10/27/2017   Procedure: ESOPHAGOGASTRODUODENOSCOPY (EGD) WITH PROPOFOL;  Surgeon: EManya Silvas MD;  [facility]  Service: Endoscopy;  Laterality: N/A;  . LOWER EXTREMITY ANGIOGRAPHY Right 05/19/2017   Procedure: LOWER EXTREMITY ANGIOGRAPHY;  Surgeon: DAlgernon Huxley MD;  [facility];  Service: Cardiovascular;  Laterality: Right;    FAMILY HISTORY :   Family History  Problem  Relation Age of Onset  . Hypertension Sister     SOCIAL HISTORY:   Social History   Tobacco Use  . Smoking status: Current Every Day Smoker    Packs/day: 0.50    Years: 47.00    Pack years: 23.50    Types: Cigarettes  . Smokeless tobacco: Never Used  Substance Use Topics  . Alcohol use: Yes    Alcohol/week: 1.0 standard drinks    Types: 1 Glasses of wine per week    Comment:  almost none in last 6 months  . Drug use: No    ALLERGIES:  has No Known Allergies.  MEDICATIONS:  Current Outpatient Medications  Medication Sig Dispense Refill  . amLODipine (NORVASC) 10 MG tablet Take 1 tablet (10 mg total) by mouth daily. 30 tablet 3  . aspirin EC 81 MG tablet Take 81 mg by mouth daily.    Marland Kitchen atorvastatin (LIPITOR) 10 MG tablet Take 1 tablet (10 mg total) by mouth daily. 30 tablet 11  . clopidogrel (PLAVIX) 75 MG tablet Take 1 tablet (75 mg total) by mouth daily. 30 tablet 11  . IMBRUVICA 280 MG TABS TAKE 1 TABLET BY MOUTH DAILY. 28 tablet 6  . iron polysaccharides (NU-IRON) 150 MG capsule Take 150 mg by mouth daily.     Marland Kitchen linaclotide (LINZESS) 145 MCG CAPS capsule Take 1 capsule (145 mcg total) by mouth daily before breakfast. 30 capsule 3  . lisinopril (PRINIVIL,ZESTRIL) 30 MG tablet Take 30 mg by mouth daily.  1  . metFORMIN (GLUMETZA) 1000 MG (MOD) 24 hr tablet Take 1,000 mg by mouth 2 (two) times daily.    . metoprolol tartrate (LOPRESSOR) 25 MG tablet TAKE 1 TABLET BY MOUTH TWICE DAILY 180 tablet 1  . omeprazole (PRILOSEC) 40 MG capsule Take 40 mg by mouth daily.     Marland Kitchen oxyCODONE-acetaminophen (PERCOCET/ROXICET) 5-325 MG tablet 1 pill every 8-12 hours 90 tablet 0  . phenazopyridine (PYRIDIUM) 100 MG tablet Take 1 tablet (100 mg total) by mouth 3 (three) times daily as needed for pain. 30 tablet 0   No current facility-administered medications for this visit.     PHYSICAL EXAMINATION: ECOG PERFORMANCE STATUS: 1 - Symptomatic but completely ambulatory  BP 121/67 (BP Location:  Left Arm, Patient Position: Sitting)   Pulse (!) 43   Temp 97.7 F (36.5 C) (Tympanic)   Resp 16   Ht _0  (1.727 m)   Wt 177 lb 1.6 oz (80.3 kg)   BMI 26.93 kg/m   Filed Weights   05/14/18 0835  Weight: 177 lb 1.6 oz (80.3 kg)    Physical Exam  Constitutional: He is oriented to person, place, and time and well-developed, well-nourished, and in no distress.  Is accompanied by his wife.  Appears pale.  HENT:  Head: Normocephalic and atraumatic.  Mouth/Throat: Oropharynx is clear and moist. No oropharyngeal exudate.  Eyes: Pupils are equal, round, and reactive to light.  Neck: Normal range of motion. Neck supple.  Cardiovascular: Normal rate and regular rhythm.  Pulmonary/Chest: No respiratory distress. He has no wheezes.  Decreased air entry bil.   Abdominal: Soft. Bowel sounds are normal. He exhibits no distension and no mass. There is no abdominal tenderness. There is no rebound and no guarding.  Musculoskeletal: Normal range of motion.        General: No tenderness or edema.  Neurological: He is alert  and oriented to person, place, and time.  Skin: Skin is warm. There is pallor.  Psychiatric: Affect normal.      LABORATORY DATA:  I have reviewed the data as listed    Component Value Date/Time   NA 144 05/14/2018 0819   NA 142 05/03/2014 1139   K 4.5 05/14/2018 0819   K 4.7 05/03/2014 1139   CL 110 05/14/2018 0819   CL 110 (H) 05/03/2014 1139   CO2 26 05/14/2018 0819   CO2 24 05/03/2014 1139   GLUCOSE 125 (H) 05/14/2018 0819   GLUCOSE 98 05/03/2014 1139   BUN 22 05/14/2018 0819   BUN 20 (H) 05/03/2014 1139   CREATININE 1.35 (H) 05/14/2018 0819   CREATININE 1.22 08/09/2014 1122   CALCIUM 8.8 (L) 05/14/2018 0819   CALCIUM 8.6 05/03/2014 1139   PROT 6.8 05/14/2018 0819   PROT 7.5 05/03/2014 1139   ALBUMIN 4.3 05/14/2018 0819   ALBUMIN 4.1 05/03/2014 1139   AST 12 (L) 05/14/2018 0819   AST 15 05/03/2014 1139   ALT 7 05/14/2018 0819   ALT 26 05/03/2014 1139    ALKPHOS 90 05/14/2018 0819   ALKPHOS 94 05/03/2014 1139   BILITOT 0.6 05/14/2018 0819   BILITOT 0.5 05/03/2014 1139   GFRNONAA 53 (L) 05/14/2018 0819   GFRNONAA >60 08/09/2014 1122   GFRAA >60 05/14/2018 0819   GFRAA >60 08/09/2014 1122    No results found for: SPEP, UPEP  Lab Results  Component Value Date   WBC 9.5 05/14/2018   NEUTROABS 2.7 05/14/2018   HGB 8.7 (L) 05/14/2018   HCT 29.1 (L) 05/14/2018   MCV 108.2 (H) 05/14/2018   PLT 126 (L) 05/14/2018      Chemistry      Component Value Date/Time   NA 144 05/14/2018 0819   NA 142 05/03/2014 1139   K 4.5 05/14/2018 0819   K 4.7 05/03/2014 1139   CL 110 05/14/2018 0819   CL 110 (H) 05/03/2014 1139   CO2 26 05/14/2018 0819   CO2 24 05/03/2014 1139   BUN 22 05/14/2018 0819   BUN 20 (H) 05/03/2014 1139   CREATININE 1.35 (H) 05/14/2018 0819   CREATININE 1.22 08/09/2014 1122      Component Value Date/Time   CALCIUM 8.8 (L) 05/14/2018 0819   CALCIUM 8.6 05/03/2014 1139   ALKPHOS 90 05/14/2018 0819   ALKPHOS 94 05/03/2014 1139   AST 12 (L) 05/14/2018 0819   AST 15 05/03/2014 1139   ALT 7 05/14/2018 0819   ALT 26 05/03/2014 1139   BILITOT 0.6 05/14/2018 0819   BILITOT 0.5 05/03/2014 1139       RADIOGRAPHIC STUDIES: I have personally reviewed the radiological images as listed and agreed with the findings in the report. No results found.   ASSESSMENT & PLAN:  CLL (chronic lymphoid leukemia) in relapse ([place]) # CLL/SLL- relapsed currently off ibrutinib [sec ton intolerance in sep 2019]; SEP 24th 2019-CT scan shows no progressive/worsening lymphadenopathy.  Restart ibrutinib 280 mg once a day today.  #Anemia hemoglobin 8.4-unclear etiology/CKD.  Start Aranesp every 2 weeks today.  Again discussed the potential side effects. # HTN-better controlled.  # Abdominal pain/dyspepsia/unclear etiology; stable continue Percocet for now.  New prescription given.  Will check urine drug screen at next visit.  #  DISPOSITION:  # NO transfusion today; proceed with aranesp. # in 2 weeks- H&H weeks/possible aranesp; urine drug screen.  # in 4 weeks- MD/ cbc/bmp/possible aranesp-Dr.B    No orders of  the defined types were placed in this encounter.  All questions were answered. The patient knows to call the clinic with any problems, questions or concerns.      Cammie Sickle, MD 05/18/2018 12:39 PM

## 2018-05-14 NOTE — Assessment & Plan Note (Addendum)
#   CLL/SLL- relapsed currently off ibrutinib [sec ton intolerance in sep 2019]; SEP 24th 2019-CT scan shows no progressive/worsening lymphadenopathy.  Restart ibrutinib 280 mg once a day today.  #Anemia hemoglobin 8.4-unclear etiology/CKD.  Start Aranesp every 2 weeks today.  Again discussed the potential side effects. # HTN-better controlled.  # Abdominal pain/dyspepsia/unclear etiology; stable continue Percocet for now.  New prescription given.  Will check urine drug screen at next visit.  # DISPOSITION:  # NO transfusion today; proceed with aranesp. # in 2 weeks- H&H weeks/possible aranesp; urine drug screen.  # in 4 weeks- MD/ cbc/bmp/possible aranesp-Dr.B

## 2018-05-14 NOTE — Progress Notes (Signed)
Patient here for follow up. He reports on-going abdominal pain.

## 2018-05-18 ENCOUNTER — Telehealth: Payer: Self-pay | Admitting: Internal Medicine

## 2018-05-18 DIAGNOSIS — C9192 Lymphoid leukemia, unspecified, in relapse: Secondary | ICD-10-CM

## 2018-05-18 DIAGNOSIS — C9112 Chronic lymphocytic leukemia of B-cell type in relapse: Secondary | ICD-10-CM

## 2018-05-18 NOTE — Addendum Note (Signed)
Addended by: Sandria Bales B on: 05/18/2018 01:14 PM   Modules accepted: Orders

## 2018-05-18 NOTE — Telephone Encounter (Signed)
Please order; not done at last visit.   # in ~ 2 weeks- H&H weeks/possible aranesp; urine drug screen.  # in 4 weeks- MD/ cbc/bmp/possible aranesp-Dr.B Thx

## 2018-05-28 ENCOUNTER — Inpatient Hospital Stay: Payer: Medicare Other

## 2018-05-28 DIAGNOSIS — C9192 Lymphoid leukemia, unspecified, in relapse: Secondary | ICD-10-CM

## 2018-05-28 DIAGNOSIS — I129 Hypertensive chronic kidney disease with stage 1 through stage 4 chronic kidney disease, or unspecified chronic kidney disease: Secondary | ICD-10-CM | POA: Diagnosis not present

## 2018-05-28 DIAGNOSIS — C9112 Chronic lymphocytic leukemia of B-cell type in relapse: Secondary | ICD-10-CM

## 2018-05-28 LAB — URINE DRUG SCREEN, QUALITATIVE (ARMC ONLY)
Amphetamines, Ur Screen: NOT DETECTED
Barbiturates, Ur Screen: NOT DETECTED
Benzodiazepine, Ur Scrn: NOT DETECTED
Cannabinoid 50 Ng, Ur ~~LOC~~: NOT DETECTED
Cocaine Metabolite,Ur ~~LOC~~: NOT DETECTED
MDMA (Ecstasy)Ur Screen: NOT DETECTED
Methadone Scn, Ur: NOT DETECTED
Opiate, Ur Screen: POSITIVE — AB
PHENCYCLIDINE (PCP) UR S: NOT DETECTED
Tricyclic, Ur Screen: NOT DETECTED

## 2018-05-28 LAB — HEMATOCRIT: HCT: 33.4 % — ABNORMAL LOW (ref 39.0–52.0)

## 2018-05-28 LAB — HEMOGLOBIN: Hemoglobin: 10.1 g/dL — ABNORMAL LOW (ref 13.0–17.0)

## 2018-06-02 ENCOUNTER — Ambulatory Visit (INDEPENDENT_AMBULATORY_CARE_PROVIDER_SITE_OTHER): Payer: Medicare Other | Admitting: Vascular Surgery

## 2018-06-02 ENCOUNTER — Encounter (INDEPENDENT_AMBULATORY_CARE_PROVIDER_SITE_OTHER): Payer: Self-pay | Admitting: Vascular Surgery

## 2018-06-02 ENCOUNTER — Ambulatory Visit (INDEPENDENT_AMBULATORY_CARE_PROVIDER_SITE_OTHER): Payer: Medicare Other

## 2018-06-02 VITALS — BP 122/74 | HR 44 | Resp 16 | Ht 68.0 in | Wt 170.2 lb

## 2018-06-02 DIAGNOSIS — I70219 Atherosclerosis of native arteries of extremities with intermittent claudication, unspecified extremity: Secondary | ICD-10-CM | POA: Diagnosis not present

## 2018-06-02 DIAGNOSIS — F17210 Nicotine dependence, cigarettes, uncomplicated: Secondary | ICD-10-CM

## 2018-06-02 DIAGNOSIS — E118 Type 2 diabetes mellitus with unspecified complications: Secondary | ICD-10-CM | POA: Diagnosis not present

## 2018-06-02 DIAGNOSIS — I10 Essential (primary) hypertension: Secondary | ICD-10-CM | POA: Diagnosis not present

## 2018-06-02 NOTE — Assessment & Plan Note (Signed)
Currently doing well with no limb threatening or lifestyle limiting symptoms.  Right ABI is 1 and left ABI stable at 0.61.  No role for intervention at this time.  Recheck in 1 year.  Continue current medical regimen.

## 2018-06-02 NOTE — Progress Notes (Signed)
MRN : [id number]  James [name] is a 68 y.o. (1950/09/10) male who presents with chief complaint of  Chief Complaint  Patient presents with  . Follow-up  .  History of Present Illness: Patient returns today in follow up of of his PAD.  About a year ago, he underwent extensive right lower extremity revascularization for rest pain. Currently doing well with no limb threatening or lifestyle limiting symptoms.  Right ABI is 1 and left ABI stable at 0.61.   Current Outpatient Medications  Medication Sig Dispense Refill  . amLODipine (NORVASC) 10 MG tablet Take 1 tablet (10 mg total) by mouth daily. 30 tablet 3  . aspirin EC 81 MG tablet Take 81 mg by mouth daily.    . clopidogrel (PLAVIX) 75 MG tablet Take 1 tablet (75 mg total) by mouth daily. 30 tablet 11  . IMBRUVICA 280 MG TABS TAKE 1 TABLET BY MOUTH DAILY. 28 tablet 6  . iron polysaccharides (NU-IRON) 150 MG capsule Take 150 mg by mouth daily.     Marland Kitchen linaclotide (LINZESS) 145 MCG CAPS capsule Take 1 capsule (145 mcg total) by mouth daily before breakfast. 30 capsule 3  . lisinopril (PRINIVIL,ZESTRIL) 30 MG tablet Take 30 mg by mouth daily.  1  . metFORMIN (GLUMETZA) 1000 MG (MOD) 24 hr tablet Take 1,000 mg by mouth 2 (two) times daily.    . metoprolol tartrate (LOPRESSOR) 25 MG tablet TAKE 1 TABLET BY MOUTH TWICE DAILY 180 tablet 1  . omeprazole (PRILOSEC) 40 MG capsule Take 40 mg by mouth daily.     Marland Kitchen oxyCODONE-acetaminophen (PERCOCET/ROXICET) 5-325 MG tablet 1 pill every 8-12 hours 90 tablet 0  . phenazopyridine (PYRIDIUM) 100 MG tablet Take 1 tablet (100 mg total) by mouth 3 (three) times daily as needed for pain. 30 tablet 0  . atorvastatin (LIPITOR) 10 MG tablet Take 1 tablet (10 mg total) by mouth daily. 30 tablet 11   No current facility-administered medications for this visit.     Past Medical History:  Diagnosis Date  . CLL (chronic lymphocytic leukemia) ([place])   . Diabetes mellitus without complication ([place])   .  Hematuria   . Hypertension     Past Surgical History:  Procedure Laterality Date  . CATARACT EXTRACTION W/ INTRAOCULAR LENS IMPLANT Bilateral   . CHOLECYSTECTOMY  1983  . COLONOSCOPY WITH PROPOFOL N/A 10/27/2017   Procedure: COLONOSCOPY WITH PROPOFOL;  Surgeon: Manya Silvas, MD;  Location: [facility] ENDOSCOPY;  Service: Endoscopy;  Laterality: N/A;  . ESOPHAGOGASTRODUODENOSCOPY (EGD) WITH PROPOFOL N/A 11/20/2016   Procedure: ESOPHAGOGASTRODUODENOSCOPY (EGD) WITH PROPOFOL;  Surgeon: Manya Silvas, MD;  Location: [facility] ENDOSCOPY;  Service: Endoscopy;  Laterality: N/A;  . ESOPHAGOGASTRODUODENOSCOPY (EGD) WITH PROPOFOL N/A 10/27/2017   Procedure: ESOPHAGOGASTRODUODENOSCOPY (EGD) WITH PROPOFOL;  Surgeon: Manya Silvas, MD;  Location: [facility] ENDOSCOPY;  Service: Endoscopy;  Laterality: N/A;  . LOWER EXTREMITY ANGIOGRAPHY Right 05/19/2017   Procedure: LOWER EXTREMITY ANGIOGRAPHY;  Surgeon: Algernon Huxley, MD;  Location: [place] CV LAB;  Service: Cardiovascular;  Laterality: Right;   Social History        Tobacco Use  . Smoking status: Current Every Day Smoker    Packs/day: 1.00    Years: 47.00    Pack years: 47.00    Types: Cigarettes  . Smokeless tobacco: Never Used  Substance Use Topics  . Alcohol use: Yes    Alcohol/week: 3.0 oz    Types: 5 Glasses of wine per week    Comment: 5  a week  . Drug use: No    Family History      Family History  Problem Relation Age of Onset  . Hypertension Sister     No Known Allergies   REVIEW OF SYSTEMS(Negative unless checked)  Constitutional: [] ?Weight loss[] ?Fever[] ?Chills Cardiac:[] ?Chest pain[] ?Chest pressure[] ?Palpitations [] ?Shortness of breath when laying flat [] ?Shortness of breath at rest [] ?Shortness of breath with exertion. Vascular: [x] ?Pain in legs with walking[] ?Pain in legsat rest[] ?Pain in legs when laying flat [x] ?Claudication [] ?Pain in feet when walking  [] ?Pain in feet at rest [] ?Pain in feet when laying flat [] ?History of DVT [] ?Phlebitis [x] ?Swelling in legs [] ?Varicose veins [] ?Non-healing ulcers Pulmonary: [] ?Uses home oxygen [] ?Productive cough[] ?Hemoptysis [] ?Wheeze [] ?COPD [] ?Asthma Neurologic: [] ?Dizziness [] ?Blackouts [] ?Seizures [] ?History of stroke [] ?History of TIA[] ?Aphasia [] ?Temporary blindness[] ?Dysphagia [] ?Weaknessor numbness in arms [] ?Weakness or numbnessin legs Musculoskeletal: [x] ?Arthritis [] ?Joint swelling [] ?Joint pain [] ?Low back pain Hematologic:[] ?Easy bruising[] ?Easy bleeding [] ?Hypercoagulable state [] ?Anemic  Gastrointestinal:[] ?Blood in stool[] ?Vomiting blood[x] ?Gastroesophageal reflux/heartburn[] ?Abdominal pain Genitourinary: [] ?Chronic kidney disease [] ?Difficulturination [] ?Frequenturination [] ?Burning with urination[] ?Hematuria Skin: [] ?Rashes [] ?Ulcers [] ?Wounds Psychological: [] ?History of anxiety[] ?History of major depression.     Physical Examination  BP 122/74 (BP Location: Right Arm, Patient Position: Sitting, Cuff Size: Large)   Pulse (!) 44   Resp 16   Ht 5' 8"  (1.727 m)   Wt 170 lb 3.2 oz (77.2 kg)   BMI 25.88 kg/m  Gen:  WD/WN, NAD Head: [place]/AT, No temporalis wasting. Ear/Nose/Throat: Hearing grossly intact, nares w/o erythema or drainage Eyes: Conjunctiva clear. Sclera non-icteric Neck: Supple.  Trachea midline Pulmonary:  Good air movement, no use of accessory muscles.  Cardiac: RRR, no JVD Vascular:  Vessel Right Left  Radial Palpable Palpable                          PT Palpable  1+ palpable  DP  1+ palpable  1+ palpable    Musculoskeletal: M/S 5/5 throughout.  No deformity or atrophy.  Trace lower extremity edema. Neurologic: Sensation grossly intact in extremities.  Symmetrical.  Speech is fluent.  Psychiatric: Judgment intact, Mood & affect appropriate for pt's clinical  situation. Dermatologic: No rashes or ulcers noted.  No cellulitis or open wounds.       Labs Recent Results (from the past 2160 hour(s))  Sample to Blood Bank     Status: None   Collection Time: 03/18/18  9:07 AM  Result Value Ref Range   Blood Bank Specimen SAMPLE AVAILABLE FOR TESTING    Sample Expiration      03/21/2018 Performed at [facility], [address] [place], [place] [address]   Comprehensive metabolic panel     Status: Abnormal   Collection Time: 03/18/18  9:20 AM  Result Value Ref Range   Sodium 139 135 - 145 mmol/L   Potassium 4.9 3.5 - 5.1 mmol/L   Chloride 108 98 - 111 mmol/L   CO2 24 22 - 32 mmol/L   Glucose, Bld 140 (H) 70 - 99 mg/dL   BUN 18 8 - 23 mg/dL   Creatinine, Ser 1.08 0.61 - 1.24 mg/dL   Calcium 8.8 (L) 8.9 - 10.3 mg/dL   Total Protein 6.6 6.5 - 8.1 g/dL   Albumin 4.1 3.5 - 5.0 g/dL   AST 11 (L) 15 - 41 U/L   ALT 11 0 - 44 U/L   Alkaline Phosphatase 97 38 - 126 U/L   Total Bilirubin 0.6 0.3 - 1.2 mg/dL   GFR calc non Af Amer >60 >60 mL/min  GFR calc Af Amer >60 >60 mL/min    Comment: (NOTE) The eGFR has been calculated using the CKD EPI equation. This calculation has not been validated in all clinical situations. eGFR's persistently <60 mL/min signify possible Chronic Kidney Disease.    Anion gap 7 5 - 15    Comment: Performed at [facility], [address] [place], [place] [address]  CBC with Differential/Platelet     Status: Abnormal   Collection Time: 03/18/18  9:20 AM  Result Value Ref Range   WBC 9.6 4.0 - 10.5 K/uL   RBC 2.68 (L) 4.22 - 5.81 MIL/uL   Hemoglobin 8.4 (L) 13.0 - 17.0 g/dL   HCT 27.9 (L) 39.0 - 52.0 %   MCV 104.1 (H) 80.0 - 100.0 fL   MCH 31.3 26.0 - 34.0 pg   MCHC 30.1 30.0 - 36.0 g/dL   RDW 20.1 (H) 11.5 - 15.5 %   Platelets 168 150 - 400 K/uL   nRBC 0.0 0.0 - 0.2 %   Neutrophils Relative % 27 %   Neutro Abs 2.6 1.7 - 7.7 K/uL   Lymphocytes Relative 38 %   Lymphs Abs 3.7 0.7 - 4.0  K/uL   Monocytes Relative 30 %   Monocytes Absolute 2.8 (H) 0.1 - 1.0 K/uL   Eosinophils Relative 3 %   Eosinophils Absolute 0.3 0.0 - 0.5 K/uL   Basophils Relative 2 %   Basophils Absolute 0.1 0.0 - 0.1 K/uL   WBC Morphology DIFF CONFIRMED BY MANUAL    Smear Review CONSISTANT WITH PREVIOUS    Immature Granulocytes 0 %   Abs Immature Granulocytes 0.00 0.00 - 0.07 K/uL    Comment: Performed at [facility], [address] [place], [place] [address]  Sample to Blood Bank     Status: None   Collection Time: 04/01/18  9:05 AM  Result Value Ref Range   Blood Bank Specimen SAMPLE AVAILABLE FOR TESTING    Sample Expiration      04/04/2018 Performed at [facility], [address] [place], [place] [address]   Comprehensive metabolic panel     Status: Abnormal   Collection Time: 04/01/18  9:05 AM  Result Value Ref Range   Sodium 143 135 - 145 mmol/L   Potassium 4.2 3.5 - 5.1 mmol/L   Chloride 111 98 - 111 mmol/L   CO2 25 22 - 32 mmol/L   Glucose, Bld 117 (H) 70 - 99 mg/dL   BUN 17 8 - 23 mg/dL   Creatinine, Ser 1.22 0.61 - 1.24 mg/dL   Calcium 8.5 (L) 8.9 - 10.3 mg/dL   Total Protein 6.8 6.5 - 8.1 g/dL   Albumin 4.1 3.5 - 5.0 g/dL   AST 13 (L) 15 - 41 U/L   ALT 11 0 - 44 U/L   Alkaline Phosphatase 93 38 - 126 U/L   Total Bilirubin 0.4 0.3 - 1.2 mg/dL   GFR calc non Af Amer >60 >60 mL/min   GFR calc Af Amer >60 >60 mL/min   Anion gap 7 5 - 15    Comment: Performed at [facility], [address] [place], [place] [address]  CBC with Differential/Platelet     Status: Abnormal   Collection Time: 04/01/18  9:05 AM  Result Value Ref Range   WBC 8.4 4.0 - 10.5 K/uL   RBC 2.70 (L) 4.22 - 5.81 MIL/uL   Hemoglobin 8.7 (L) 13.0 - 17.0 g/dL   HCT 28.5 (L) 39.0 -  52.0 %   MCV 105.6 (H) 80.0 - 100.0 fL   MCH 32.2 26.0 - 34.0 pg   MCHC 30.5 30.0 - 36.0 g/dL   RDW 19.3 (H) 11.5 - 15.5 %   Platelets 141 (L) 150 - 400 K/uL   nRBC 0.0 0.0 - 0.2 %   Neutrophils  Relative % 35 %   Neutro Abs 3.0 1.7 - 7.7 K/uL   Lymphocytes Relative 53 %   Lymphs Abs 4.5 (H) 0.7 - 4.0 K/uL   Monocytes Relative 8 %   Monocytes Absolute 0.7 0.1 - 1.0 K/uL   Eosinophils Relative 2 %   Eosinophils Absolute 0.2 0.0 - 0.5 K/uL   Basophils Relative 2 %   Basophils Absolute 0.1 0.0 - 0.1 K/uL   Immature Granulocytes 0 %   Abs Immature Granulocytes 0.02 0.00 - 0.07 K/uL    Comment: Performed at [facility], [address] [place], [place] [address]  Sample to Blood Bank     Status: None   Collection Time: 04/15/18  9:19 AM  Result Value Ref Range   Blood Bank Specimen SAMPLE AVAILABLE FOR TESTING    Sample Expiration      04/18/2018 Performed at [facility], [address] [place], [place] [address]   Comprehensive metabolic panel     Status: Abnormal   Collection Time: 04/15/18  9:19 AM  Result Value Ref Range   Sodium 139 135 - 145 mmol/L   Potassium 4.4 3.5 - 5.1 mmol/L   Chloride 106 98 - 111 mmol/L   CO2 25 22 - 32 mmol/L   Glucose, Bld 111 (H) 70 - 99 mg/dL   BUN 18 8 - 23 mg/dL   Creatinine, Ser 1.10 0.61 - 1.24 mg/dL   Calcium 8.9 8.9 - 10.3 mg/dL   Total Protein 6.9 6.5 - 8.1 g/dL   Albumin 4.2 3.5 - 5.0 g/dL   AST 12 (L) 15 - 41 U/L   ALT 11 0 - 44 U/L   Alkaline Phosphatase 92 38 - 126 U/L   Total Bilirubin 0.6 0.3 - 1.2 mg/dL   GFR calc non Af Amer >60 >60 mL/min   GFR calc Af Amer >60 >60 mL/min   Anion gap 8 5 - 15    Comment: Performed at [facility], [address] [place],  [address]  CBC with Differential/Platelet     Status: Abnormal   Collection Time: 04/15/18  9:19 AM  Result Value Ref Range   WBC 8.3 4.0 - 10.5 K/uL   RBC 2.64 (L) 4.22 - 5.81 MIL/uL   Hemoglobin 8.7 (L) 13.0 - 17.0 g/dL   HCT 28.2 (L) 39.0 - 52.0 %   MCV 106.8 (H) 80.0 - 100.0 fL   MCH 33.0 26.0 - 34.0 pg   MCHC 30.9 30.0 - 36.0 g/dL   RDW 18.3 (H) 11.5 - 15.5 %   Platelets 135 (L) 150 - 400 K/uL   nRBC 0.0 0.0 - 0.2 %    Neutrophils Relative % 35 %   Neutro Abs 2.9 1.7 - 7.7 K/uL   Lymphocytes Relative 42 %   Lymphs Abs 3.5 0.7 - 4.0 K/uL   Monocytes Relative 20 %   Monocytes Absolute 1.6 (H) 0.1 - 1.0 K/uL   Eosinophils Relative 2 %   Eosinophils Absolute 0.1 0.0 - 0.5 K/uL   Basophils Relative 1 %   Basophils Absolute 0.1 0.0 - 0.1 K/uL   WBC Morphology DIFF CONFIRMED BY MANUAL  Smear Review RBC MORPHOLOGY NORMAL     Comment: PLATELETS APPEAR DECREASED Normal platelet morphology    Immature Granulocytes 0 %   Abs Immature Granulocytes 0.02 0.00 - 0.07 K/uL    Comment: Performed at [facility], [address] [place], [place] [address]  Sample to Blood Bank     Status: None   Collection Time: 04/30/18  9:21 AM  Result Value Ref Range   Blood Bank Specimen SAMPLE AVAILABLE FOR TESTING    Sample Expiration      05/03/2018 Performed at [facility], [address] [place], [place] [address]   Hemoglobin [facility])     Status: Abnormal   Collection Time: 04/30/18  9:22 AM  Result Value Ref Range   Hemoglobin 8.9 (L) 13.0 - 17.0 g/dL    Comment: Performed at [facility], [address] [place], [place] [address]  Hematocrit [facility])     Status: Abnormal   Collection Time: 04/30/18  9:22 AM  Result Value Ref Range   HCT 29.1 (L) 39.0 - 52.0 %    Comment: Performed at [facility], [address] [place], [place] [address]  Sample to Blood Bank     Status: None   Collection Time: 05/14/18  8:19 AM  Result Value Ref Range   Blood Bank Specimen SAMPLE AVAILABLE FOR TESTING    Sample Expiration      05/17/2018 Performed at [facility], [address] [place], [place] [address]   CBC with Differential/Platelet     Status: Abnormal   Collection Time: 05/14/18  8:19 AM  Result Value Ref Range   WBC 9.5 4.0 - 10.5 K/uL   RBC 2.69 (L) 4.22 - 5.81 MIL/uL   Hemoglobin 8.7 (L) 13.0 - 17.0 g/dL   HCT 29.1 (L) 39.0 - 52.0 %   MCV 108.2 (H) 80.0 - 100.0 fL     MCH 32.3 26.0 - 34.0 pg   MCHC 29.9 (L) 30.0 - 36.0 g/dL   RDW 16.9 (H) 11.5 - 15.5 %   Platelets 126 (L) 150 - 400 K/uL   nRBC 0.0 0.0 - 0.2 %   Neutrophils Relative % 28 %   Neutro Abs 2.7 1.7 - 7.7 K/uL   Lymphocytes Relative 54 %   Lymphs Abs 5.1 (H) 0.7 - 4.0 K/uL   Monocytes Relative 16 %   Monocytes Absolute 1.5 (H) 0.1 - 1.0 K/uL   Eosinophils Relative 1 %   Eosinophils Absolute 0.1 0.0 - 0.5 K/uL   Basophils Relative 1 %   Basophils Absolute 0.1 0.0 - 0.1 K/uL   Immature Granulocytes 0 %   Abs Immature Granulocytes 0.03 0.00 - 0.07 K/uL    Comment: Performed at [facility], [address] [place], [place] [address]  Comprehensive metabolic panel     Status: Abnormal   Collection Time: 05/14/18  8:19 AM  Result Value Ref Range   Sodium 144 135 - 145 mmol/L   Potassium 4.5 3.5 - 5.1 mmol/L   Chloride 110 98 - 111 mmol/L   CO2 26 22 - 32 mmol/L   Glucose, Bld 125 (H) 70 - 99 mg/dL   BUN 22 8 - 23 mg/dL   Creatinine, Ser 1.35 (H) 0.61 - 1.24 mg/dL   Calcium 8.8 (L) 8.9 - 10.3 mg/dL   Total Protein 6.8 6.5 - 8.1 g/dL   Albumin 4.3 3.5 - 5.0 g/dL   AST 12 (L) 15 - 41 U/L   ALT  7 0 - 44 U/L   Alkaline Phosphatase 90 38 - 126 U/L   Total Bilirubin 0.6 0.3 - 1.2 mg/dL   GFR calc non Af Amer 53 (L) >60 mL/min   GFR calc Af Amer >60 >60 mL/min   Anion gap 8 5 - 15    Comment: Performed at [facility], [address] [place], [place] [address]  Hemoglobin     Status: Abnormal   Collection Time: 05/28/18  9:19 AM  Result Value Ref Range   Hemoglobin 10.1 (L) 13.0 - 17.0 g/dL    Comment: Performed at [facility], [address] [place], [place] [address]  Hematocrit     Status: Abnormal   Collection Time: 05/28/18  9:19 AM  Result Value Ref Range   HCT 33.4 (L) 39.0 - 52.0 %    Comment: Performed at [facility], [address] [place], [place] [address]  Urine Drug Screen, Qualitative [facility])     Status: Abnormal   Collection Time:  05/28/18  9:19 AM  Result Value Ref Range   Tricyclic, Ur Screen NONE DETECTED NONE DETECTED   Amphetamines, Ur Screen NONE DETECTED NONE DETECTED   MDMA (Ecstasy)Ur Screen NONE DETECTED NONE DETECTED   Cocaine Metabolite,Ur [place] NONE DETECTED NONE DETECTED   Opiate, Ur Screen POSITIVE (A) NONE DETECTED   Phencyclidine (PCP) Ur S NONE DETECTED NONE DETECTED   Cannabinoid 50 Ng, Ur [place] NONE DETECTED NONE DETECTED   Barbiturates, Ur Screen NONE DETECTED NONE DETECTED   Benzodiazepine, Ur Scrn NONE DETECTED NONE DETECTED   Methadone Scn, Ur NONE DETECTED NONE DETECTED    Comment: (NOTE) Tricyclics + metabolites, urine    Cutoff 1000 ng/mL Amphetamines + metabolites, urine  Cutoff 1000 ng/mL MDMA (Ecstasy), urine              Cutoff 500 ng/mL Cocaine Metabolite, urine          Cutoff 300 ng/mL Opiate + metabolites, urine        Cutoff 300 ng/mL Phencyclidine (PCP), urine         Cutoff 25 ng/mL Cannabinoid, urine                 Cutoff 50 ng/mL Barbiturates + metabolites, urine  Cutoff 200 ng/mL Benzodiazepine, urine              Cutoff 200 ng/mL Methadone, urine                   Cutoff 300 ng/mL The urine drug screen provides only a preliminary, unconfirmed analytical test result and should not be used for non-medical purposes. Clinical consideration and professional judgment should be applied to any positive drug screen result due to possible interfering substances. A more specific alternate chemical method must be used in order to obtain a confirmed analytical result. Gas chromatography / mass spectrometry (GC/MS) is the preferred confirmat ory method. Performed at [facility], [address], [place], [place] [address]     Radiology No results found.  Assessment/Plan Essential hypertension blood pressure control important in reducing the progression of atherosclerotic disease. On appropriate oral medications.   Type 2 diabetes mellitus with complication  (HCC) blood glucose control important in reducing the progression of atherosclerotic disease. Also, involved in wound healing. On appropriate medications.  Atherosclerotic peripheral vascular disease with intermittent claudication (HCC) Currently doing well with no limb threatening or lifestyle limiting symptoms.  Right ABI is 1 and left ABI stable at 0.61.  No role for intervention at this time.  Recheck in 1 year.  Continue current medical regimen.    Leotis Pain, MD  06/02/2018 2:25 PM    This note was created with Dragon medical transcription system.  Any errors from dictation are purely unintentional

## 2018-06-02 NOTE — Patient Instructions (Signed)
Peripheral Vascular Disease  Peripheral vascular disease (PVD) is a disease of the blood vessels that are not part of your heart and brain. A simple term for PVD is poor circulation. In most cases, PVD narrows the blood vessels that carry blood from your heart to the rest of your body. This can reduce the supply of blood to your arms, legs, and internal organs, like your stomach or kidneys. However, PVD most often affects a person's lower legs and feet. Without treatment, PVD tends to get worse. PVD can also lead to acute ischemic limb. This is when an arm or leg suddenly cannot get enough blood. This is a medical emergency. Follow these instructions at home: Lifestyle  Do not use any products that contain nicotine or tobacco, such as cigarettes and e-cigarettes. If you need help quitting, ask your doctor.  Lose weight if you are overweight. Or, stay at a healthy weight as told by your doctor.  Eat a diet that is low in fat and cholesterol. If you need help, ask your doctor.  Exercise regularly. Ask your doctor for activities that are right for you. General instructions  Take over-the-counter and prescription medicines only as told by your doctor.  Take good care of your feet: ? Wear comfortable shoes that fit well. ? Check your feet often for any cuts or sores.  Keep all follow-up visits as told by your doctor This is important. Contact a doctor if:  You have cramps in your legs when you walk.  You have leg pain when you are at rest.  You have coldness in a leg or foot.  Your skin changes.  You are unable to get or have an erection (erectile dysfunction).  You have cuts or sores on your feet that do not heal. Get help right away if:  Your arm or leg turns cold, numb, and blue.  Your arms or legs become red, warm, swollen, painful, or numb.  You have chest pain.  You have trouble breathing.  You suddenly have weakness in your face, arm, or leg.  You become very  confused or you cannot speak.  You suddenly have a very bad headache.  You suddenly cannot see. Summary  Peripheral vascular disease (PVD) is a disease of the blood vessels.  A simple term for PVD is poor circulation. Without treatment, PVD tends to get worse.  Treatment may include exercise, low fat and low cholesterol diet, and quitting smoking. This information is not intended to replace advice given to you by your health care provider. Make sure you discuss any questions you have with your health care provider. Document Released: 07/10/2009 Document Revised: 05/23/2016 Document Reviewed: 05/23/2016 Elsevier Interactive Patient Education  2019 [employer].  

## 2018-06-04 ENCOUNTER — Other Ambulatory Visit: Payer: Self-pay | Admitting: Internal Medicine

## 2018-06-04 DIAGNOSIS — C9112 Chronic lymphocytic leukemia of B-cell type in relapse: Secondary | ICD-10-CM

## 2018-06-04 DIAGNOSIS — C9192 Lymphoid leukemia, unspecified, in relapse: Secondary | ICD-10-CM

## 2018-06-08 MED FILL — IMBRUVICA 280 MG TAB: 280 | 28 days supply | Qty: 28 | Fill #0

## 2018-06-09 ENCOUNTER — Other Ambulatory Visit (INDEPENDENT_AMBULATORY_CARE_PROVIDER_SITE_OTHER): Payer: Self-pay | Admitting: Vascular Surgery

## 2018-06-10 NOTE — Telephone Encounter (Signed)
PLease advise on refill request

## 2018-06-10 NOTE — Telephone Encounter (Signed)
Since this medication is a statin, it should be followed by his PCP as his liver function needs to be followed while on this medication.

## 2018-06-11 ENCOUNTER — Inpatient Hospital Stay: Payer: Medicare Other

## 2018-06-11 ENCOUNTER — Other Ambulatory Visit: Payer: Self-pay

## 2018-06-11 ENCOUNTER — Inpatient Hospital Stay (HOSPITAL_BASED_OUTPATIENT_CLINIC_OR_DEPARTMENT_OTHER): Payer: Medicare Other | Admitting: Internal Medicine

## 2018-06-11 ENCOUNTER — Inpatient Hospital Stay: Payer: Medicare Other | Attending: Internal Medicine

## 2018-06-11 ENCOUNTER — Encounter: Payer: Self-pay | Admitting: Internal Medicine

## 2018-06-11 VITALS — BP 116/70 | HR 44 | Temp 97.9°F | Resp 20

## 2018-06-11 DIAGNOSIS — C9112 Chronic lymphocytic leukemia of B-cell type in relapse: Secondary | ICD-10-CM

## 2018-06-11 DIAGNOSIS — D72820 Lymphocytosis (symptomatic): Secondary | ICD-10-CM

## 2018-06-11 DIAGNOSIS — N183 Chronic kidney disease, stage 3 unspecified: Secondary | ICD-10-CM

## 2018-06-11 DIAGNOSIS — I129 Hypertensive chronic kidney disease with stage 1 through stage 4 chronic kidney disease, or unspecified chronic kidney disease: Secondary | ICD-10-CM | POA: Diagnosis not present

## 2018-06-11 DIAGNOSIS — D631 Anemia in chronic kidney disease: Secondary | ICD-10-CM

## 2018-06-11 DIAGNOSIS — I10 Essential (primary) hypertension: Secondary | ICD-10-CM

## 2018-06-11 DIAGNOSIS — N189 Chronic kidney disease, unspecified: Secondary | ICD-10-CM | POA: Diagnosis present

## 2018-06-11 DIAGNOSIS — D649 Anemia, unspecified: Secondary | ICD-10-CM | POA: Diagnosis present

## 2018-06-11 DIAGNOSIS — C9192 Lymphoid leukemia, unspecified, in relapse: Secondary | ICD-10-CM

## 2018-06-11 DIAGNOSIS — F17210 Nicotine dependence, cigarettes, uncomplicated: Secondary | ICD-10-CM

## 2018-06-11 DIAGNOSIS — E119 Type 2 diabetes mellitus without complications: Secondary | ICD-10-CM

## 2018-06-11 DIAGNOSIS — R1013 Epigastric pain: Secondary | ICD-10-CM

## 2018-06-11 DIAGNOSIS — D72829 Elevated white blood cell count, unspecified: Secondary | ICD-10-CM | POA: Diagnosis not present

## 2018-06-11 DIAGNOSIS — C8300 Small cell B-cell lymphoma, unspecified site: Secondary | ICD-10-CM

## 2018-06-11 LAB — CBC WITH DIFFERENTIAL/PLATELET
Abs Immature Granulocytes: 0 10*3/uL (ref 0.00–0.07)
Basophils Absolute: 0.3 10*3/uL — ABNORMAL HIGH (ref 0.0–0.1)
Basophils Relative: 1 %
Eosinophils Absolute: 0 10*3/uL (ref 0.0–0.5)
Eosinophils Relative: 0 %
HCT: 28.5 % — ABNORMAL LOW (ref 39.0–52.0)
Hemoglobin: 8.6 g/dL — ABNORMAL LOW (ref 13.0–17.0)
Lymphocytes Relative: 62 %
Lymphs Abs: 17.2 10*3/uL — ABNORMAL HIGH (ref 0.7–4.0)
MCH: 32.2 pg (ref 26.0–34.0)
MCHC: 30.2 g/dL (ref 30.0–36.0)
MCV: 106.7 fL — ABNORMAL HIGH (ref 80.0–100.0)
MONOS PCT: 12 %
Monocytes Absolute: 3.3 10*3/uL — ABNORMAL HIGH (ref 0.1–1.0)
NEUTROS PCT: 25 %
Neutro Abs: 6.9 10*3/uL (ref 1.7–7.7)
Platelets: 72 10*3/uL — ABNORMAL LOW (ref 150–400)
RBC: 2.67 MIL/uL — ABNORMAL LOW (ref 4.22–5.81)
RDW: 15.9 % — ABNORMAL HIGH (ref 11.5–15.5)
Smear Review: DECREASED
WBC: 27.7 10*3/uL — ABNORMAL HIGH (ref 4.0–10.5)
nRBC: 0.1 % (ref 0.0–0.2)

## 2018-06-11 LAB — FERRITIN: Ferritin: 60 ng/mL (ref 24–336)

## 2018-06-11 LAB — COMPREHENSIVE METABOLIC PANEL
ALBUMIN: 4.3 g/dL (ref 3.5–5.0)
ALT: 11 U/L (ref 0–44)
AST: 12 U/L — ABNORMAL LOW (ref 15–41)
Alkaline Phosphatase: 65 U/L (ref 38–126)
Anion gap: 5 (ref 5–15)
BILIRUBIN TOTAL: 0.4 mg/dL (ref 0.3–1.2)
BUN: 37 mg/dL — ABNORMAL HIGH (ref 8–23)
CO2: 23 mmol/L (ref 22–32)
Calcium: 8.5 mg/dL — ABNORMAL LOW (ref 8.9–10.3)
Chloride: 111 mmol/L (ref 98–111)
Creatinine, Ser: 1.54 mg/dL — ABNORMAL HIGH (ref 0.61–1.24)
GFR calc Af Amer: 53 mL/min — ABNORMAL LOW (ref >60)
GFR calc non Af Amer: 45 mL/min — ABNORMAL LOW (ref >60)
Glucose, Bld: 116 mg/dL — ABNORMAL HIGH (ref 70–99)
POTASSIUM: 5.1 mmol/L (ref 3.5–5.1)
Sodium: 139 mmol/L (ref 135–145)
Total Protein: 7 g/dL (ref 6.5–8.1)

## 2018-06-11 LAB — IRON AND TIBC
Iron: 99 ug/dL (ref 45–182)
SATURATION RATIOS: 30 % (ref 17.9–39.5)
TIBC: 334 ug/dL (ref 250–450)
UIBC: 236 ug/dL

## 2018-06-11 MED ORDER — OXYCODONE-ACETAMINOPHEN 5-325 MG PO TABS: ORAL_TABLET | ORAL | 0 refills | Status: DC

## 2018-06-11 MED ORDER — DARBEPOETIN ALFA 300 MCG/0.6ML IJ SOSY
300.0000 ug | PREFILLED_SYRINGE | Freq: Once | INTRAMUSCULAR | Status: AC
  Administered 2018-06-11: 300 ug via SUBCUTANEOUS
  Filled 2018-06-11: qty 0.6

## 2018-06-11 NOTE — Progress Notes (Signed)
[facility] [place] OFFICE PROGRESS NOTE  Patient Care Team: [name], MD as PCP - General (Internal Medicine)  Cancer Staging No matching staging information was found for the patient.   Oncology History   # 2006- CLL STAGE IV; MAY 2011- WBC- 57K;Platelets-99;Hb-12/CT Bulky LN; BMBx- 80% Invol; del 11; START Benda-Ritux x4 [finished Sep 2011];   # July 2015-Progression; Sep 2015-START ibrutinib; CT scan DEC 2015- Improvement LN; Cont Ibrutinib 155m/d; NOV 2016 CT- 1-2CM LN [mild progression compared to Dec 2015];NOV 2016- FISH peripheral blood- NO MUTATIONS/CD-38 Positive; NOV 7th- CONT IBRUTINIB 2 pills/day; CT AUG 2017- STABLE;  DEC 6th PET- Mild RP LN/ Retrocrural LN  # OFF ibrutinib [? intol]- sep 2019- Jan 16th 2020; Re-start Ibrutinib   # Jan 16th 2020- start aranesp  # DEC 2019- PAIN CONTRACT  #October 2019-bone marrow biopsy [worsening anemia]-question dyserythropoietic changes/small clone of CLL;  # [facility]- NEG.  SA skin infection [Oct 2016] s/p clinda -------------------------------------------------------    DIAGNOSIS: CLL  STAGE:   IV      ;GOALS: palliative  CURRENT/MOST RECENT THERAPY : Ibrutinib.      CLL (chronic lymphoid leukemia) in relapse ([facility]    INTERVAL HISTORY:  James Blase89y.o.  male pleasant patient above history of CLL [currently off ibrutinib] and chronic abdominal pain of unclear etiology; worsening anemia of unclear etiology/? CKD is here for follow-up.  Patient is currently back on ibrutinib since last visit.  Denies any significant improvement abdominal pain.  For which he is taking Percocet 3 pills a day.  Denies any constipation or blood in stools or black or stools.  Has not had blood transfusion in the last 2 months or so.  He continues very Aranesp.  Review of Systems  Constitutional: Positive for malaise/fatigue and weight loss. Negative for chills, diaphoresis and fever.  HENT:  Negative for nosebleeds and sore throat.   Eyes: Negative for double vision.  Respiratory: Negative for cough, hemoptysis, sputum production, shortness of breath and wheezing.   Cardiovascular: Negative for chest pain, palpitations, orthopnea and leg swelling.  Gastrointestinal: Positive for abdominal pain and nausea. Negative for blood in stool, constipation, diarrhea, heartburn, melena and vomiting.  Genitourinary: Negative for dysuria, frequency and urgency.  Skin: Negative.  Negative for itching and rash.  Neurological: Negative for dizziness, tingling, focal weakness, weakness and headaches.  Endo/Heme/Allergies: Does not bruise/bleed easily.  Psychiatric/Behavioral: Negative for depression. The patient is not nervous/anxious and does not have insomnia.       PAST MEDICAL HISTORY :  Past Medical History:  Diagnosis Date  . C[place]   . [place] complication ([place]   . Hematuria   . Hypertension     PAST SURGICAL HISTORY :   Past Surgical History:  Procedure Laterality Date  . CATARACT EXTRACTION W/ INTRAOCULAR LENS IMPLANT Bilateral   . CHOLECYSTECTOMY  1983  . COLONOSCOPY WITH PROPOFOL N/A 10/27/2017   Procedure: COLONOSCOPY WITH PROPOFOL;  Surgeon: EManya Silvas MD;  [facility];  Service: Endoscopy;  Laterality: N/A;  . ESOPHAGOGASTRODUODENOSCOPY (EGD) WITH PROPOFOL N/A 11/20/2016   Procedure: ESOPHAGOGASTRODUODENOSCOPY (EGD) WITH PROPOFOL;  Surgeon: EManya Silvas MD;  [facility];  Service: Endoscopy;  Laterality: N/A;  . ESOPHAGOGASTRODUODENOSCOPY (EGD) WITH PROPOFOL N/A 10/27/2017   Procedure: ESOPHAGOGASTRODUODENOSCOPY (EGD) WITH PROPOFOL;  Surgeon: EManya Silvas MD;  [facility];  Service: Endoscopy;  Laterality: N/A;  . LOWER EXTREMITY ANGIOGRAPHY Right 05/19/2017  Procedure: LOWER EXTREMITY ANGIOGRAPHY;  Surgeon: Algernon Huxley, MD;  [place] [facility];  Service:  Cardiovascular;  Laterality: Right;    FAMILY HISTORY :   Family History  Problem Relation Age of Onset  . Hypertension Sister     SOCIAL HISTORY:   Social History   Tobacco Use  . Smoking status: Current Every Day Smoker    Packs/day: 0.50    Years: 47.00    Pack years: 23.50    Types: Cigarettes  . Smokeless tobacco: Never Used  Substance Use Topics  . Alcohol use: Yes    Alcohol/week: 1.0 standard drinks    Types: 1 Glasses of wine per week    Comment:  almost none in last 6 months  . Drug use: No    ALLERGIES:  has No Known Allergies.  MEDICATIONS:  Current Outpatient Medications  Medication Sig Dispense Refill  . amLODipine (NORVASC) 10 MG tablet Take 1 tablet (10 mg total) by mouth daily. 30 tablet 3  . aspirin EC 81 MG tablet Take 81 mg by mouth daily.    Marland Kitchen atorvastatin (LIPITOR) 10 MG tablet Take 1 tablet (10 mg total) by mouth daily. 30 tablet 11  . clopidogrel (PLAVIX) 75 MG tablet Take 1 tablet (75 mg total) by mouth daily. 30 tablet 11  . IMBRUVICA 280 MG TABS TAKE 1 TABLET BY MOUTH DAILY. 28 tablet 6  . iron polysaccharides (NU-IRON) 150 MG capsule Take 150 mg by mouth daily.     Marland Kitchen lisinopril (PRINIVIL,ZESTRIL) 30 MG tablet Take 30 mg by mouth daily.  1  . metFORMIN (GLUMETZA) 1000 MG (MOD) 24 hr tablet Take 1,000 mg by mouth 2 (two) times daily.    . metoprolol tartrate (LOPRESSOR) 25 MG tablet TAKE 1 TABLET BY MOUTH TWICE DAILY 180 tablet 1  . omeprazole (PRILOSEC) 40 MG capsule Take 40 mg by mouth daily.     Marland Kitchen oxyCODONE-acetaminophen (PERCOCET/ROXICET) 5-325 MG tablet 1 pill every 8-12 hours 90 tablet 0   No current facility-administered medications for this visit.     PHYSICAL EXAMINATION: ECOG PERFORMANCE STATUS: 1 - Symptomatic but completely ambulatory  BP 116/70 (BP Location: Left Arm, Patient Position: Sitting)   Pulse (!) 44   Temp 97.9 F (36.6 C) (Tympanic)   Resp 20   There were no vitals filed for this visit.  Physical Exam   Constitutional: He is oriented to person, place, and time and well-developed, well-nourished, and in no distress.  He is alone.  Appears pale.  HENT:  Head: Normocephalic and atraumatic.  Mouth/Throat: Oropharynx is clear and moist. No oropharyngeal exudate.  Eyes: Pupils are equal, round, and reactive to light.  Neck: Normal range of motion. Neck supple.  Cardiovascular: Normal rate and regular rhythm.  Pulmonary/Chest: No respiratory distress. He has no wheezes.  Decreased air entry bil.   Abdominal: Soft. Bowel sounds are normal. He exhibits no distension and no mass. There is no abdominal tenderness. There is no rebound and no guarding.  Musculoskeletal: Normal range of motion.        General: No tenderness or edema.  Neurological: He is alert and oriented to person, place, and time.  Skin: Skin is warm. There is pallor.  Psychiatric: Affect normal.      LABORATORY DATA:  I have reviewed the data as listed    Component Value Date/Time   NA 139 06/11/2018 0938   NA 142 05/03/2014 1139   K 5.1 06/11/2018 0938   K 4.7  05/03/2014 1139   CL 111 06/11/2018 0938   CL 110 (H) 05/03/2014 1139   CO2 23 06/11/2018 0938   CO2 24 05/03/2014 1139   GLUCOSE 116 (H) 06/11/2018 0938   GLUCOSE 98 05/03/2014 1139   BUN 37 (H) 06/11/2018 0938   BUN 20 (H) 05/03/2014 1139   CREATININE 1.54 (H) 06/11/2018 0938   CREATININE 1.22 08/09/2014 1122   CALCIUM 8.5 (L) 06/11/2018 0938   CALCIUM 8.6 05/03/2014 1139   PROT 7.0 06/11/2018 0938   PROT 7.5 05/03/2014 1139   ALBUMIN 4.3 06/11/2018 0938   ALBUMIN 4.1 05/03/2014 1139   AST 12 (L) 06/11/2018 0938   AST 15 05/03/2014 1139   ALT 11 06/11/2018 0938   ALT 26 05/03/2014 1139   ALKPHOS 65 06/11/2018 0938   ALKPHOS 94 05/03/2014 1139   BILITOT 0.4 06/11/2018 0938   BILITOT 0.5 05/03/2014 1139   GFRNONAA 45 (L) 06/11/2018 0938   GFRNONAA >60 08/09/2014 1122   GFRAA 53 (L) 06/11/2018 0938   GFRAA >60 08/09/2014 1122    No results  found for: SPEP, UPEP  Lab Results  Component Value Date   WBC 27.7 (H) 06/11/2018   NEUTROABS 6.9 06/11/2018   HGB 8.6 (L) 06/11/2018   HCT 28.5 (L) 06/11/2018   MCV 106.7 (H) 06/11/2018   PLT 72 (L) 06/11/2018      Chemistry      Component Value Date/Time   NA 139 06/11/2018 0938   NA 142 05/03/2014 1139   K 5.1 06/11/2018 0938   K 4.7 05/03/2014 1139   CL 111 06/11/2018 0938   CL 110 (H) 05/03/2014 1139   CO2 23 06/11/2018 0938   CO2 24 05/03/2014 1139   BUN 37 (H) 06/11/2018 0938   BUN 20 (H) 05/03/2014 1139   CREATININE 1.54 (H) 06/11/2018 0938   CREATININE 1.22 08/09/2014 1122      Component Value Date/Time   CALCIUM 8.5 (L) 06/11/2018 0938   CALCIUM 8.6 05/03/2014 1139   ALKPHOS 65 06/11/2018 0938   ALKPHOS 94 05/03/2014 1139   AST 12 (L) 06/11/2018 0938   AST 15 05/03/2014 1139   ALT 11 06/11/2018 0938   ALT 26 05/03/2014 1139   BILITOT 0.4 06/11/2018 0938   BILITOT 0.5 05/03/2014 1139       RADIOGRAPHIC STUDIES: I have personally reviewed the radiological images as listed and agreed with the findings in the report. No results found.   ASSESSMENT & PLAN:  CLL (chronic lymphoid leukemia) in relapse ([place]) # CLL/SLL- relapsed currently off ibrutinib [sec ton intolerance in sep 2019]; SEP 24th 2019-CT scan shows no progressive/worsening lymphadenopathy. Currently on ibrutinib 280 mg once a day today.  Stable  #Leukocytosis -28,000 g lymphocytosis likely secondary to re-starting of ibrutinib.  Monitor closely.  #Anemia hemoglobin 8.4-unclear etiology/CKD.  On Aranesp 200 every 2 weeks today. Increase to 300 mcg q 2 weeks.   On PO iron q day.  We will add iron studies today.  # Abdominal pain/dyspepsia/unclear etiology; stable continue Percocet for now.  New prescription given.    # DISPOSITION: add iron studies/ferritin today labs # proceed with aranesp. # in 2 weeks- H&H weeks/possible aranesp.   # in 4 weeks- MD/ cbc/bmp/possible  aranesp-Dr.B    Orders Placed This Encounter  Procedures  . Hemoglobin [facility])    Standing Status:   Future    Standing Expiration Date:   06/12/2019  . Hematocrit ([facility])    Standing Status:   Future  Standing Expiration Date:   06/12/2019  . CBC with Differential    Standing Status:   Future    Standing Expiration Date:   06/12/2019  . Basic metabolic panel    Standing Status:   Future    Standing Expiration Date:   06/12/2019  . Ferritin    Standing Status:   Future    Number of Occurrences:   1    Standing Expiration Date:   06/12/2019  . Iron and TIBC    Standing Status:   Future    Number of Occurrences:   1    Standing Expiration Date:   06/12/2019   All questions were answered. The patient knows to call the clinic with any problems, questions or concerns.      Cammie Sickle, MD 06/11/2018 1:37 PM

## 2018-06-11 NOTE — Assessment & Plan Note (Addendum)
#   CLL/SLL- relapsed currently off ibrutinib [sec ton intolerance in sep 2019]; SEP 24th 2019-CT scan shows no progressive/worsening lymphadenopathy. Currently on ibrutinib 280 mg once a day today.  Stable  #Leukocytosis -28,000 g lymphocytosis likely secondary to re-starting of ibrutinib.  Monitor closely.  #Anemia hemoglobin 8.4-unclear etiology/CKD.  On Aranesp 200 every 2 weeks today. Increase to 300 mcg q 2 weeks.   On PO iron q day.  We will add iron studies today.  # Abdominal pain/dyspepsia/unclear etiology; stable continue Percocet for now.  New prescription given.    # DISPOSITION: add iron studies/ferritin today labs # proceed with aranesp. # in 2 weeks- H&H weeks/possible aranesp.   # in 4 weeks- MD/ cbc/bmp/possible aranesp-Dr.B

## 2018-06-25 ENCOUNTER — Inpatient Hospital Stay: Payer: Medicare Other

## 2018-06-25 VITALS — BP 130/69

## 2018-06-25 DIAGNOSIS — C9112 Chronic lymphocytic leukemia of B-cell type in relapse: Secondary | ICD-10-CM

## 2018-06-25 DIAGNOSIS — N183 Chronic kidney disease, stage 3 unspecified: Secondary | ICD-10-CM

## 2018-06-25 DIAGNOSIS — C8300 Small cell B-cell lymphoma, unspecified site: Secondary | ICD-10-CM

## 2018-06-25 DIAGNOSIS — I129 Hypertensive chronic kidney disease with stage 1 through stage 4 chronic kidney disease, or unspecified chronic kidney disease: Secondary | ICD-10-CM | POA: Diagnosis not present

## 2018-06-25 DIAGNOSIS — D631 Anemia in chronic kidney disease: Secondary | ICD-10-CM

## 2018-06-25 DIAGNOSIS — D649 Anemia, unspecified: Secondary | ICD-10-CM | POA: Diagnosis not present

## 2018-06-25 DIAGNOSIS — C9192 Lymphoid leukemia, unspecified, in relapse: Secondary | ICD-10-CM

## 2018-06-25 LAB — HEMATOCRIT: HEMATOCRIT: 28.9 % — AB (ref 39.0–52.0)

## 2018-06-25 LAB — HEMOGLOBIN: Hemoglobin: 8.7 g/dL — ABNORMAL LOW (ref 13.0–17.0)

## 2018-06-25 MED ORDER — DARBEPOETIN ALFA 300 MCG/0.6ML IJ SOSY
300.0000 ug | PREFILLED_SYRINGE | Freq: Once | INTRAMUSCULAR | Status: AC
  Administered 2018-06-25: 300 ug via SUBCUTANEOUS
  Filled 2018-06-25: qty 0.6

## 2018-07-02 ENCOUNTER — Encounter: Payer: Self-pay | Admitting: *Deleted

## 2018-07-02 MED FILL — IMBRUVICA 280 MG TAB: 280 | 28 days supply | Qty: 28 | Fill #1

## 2018-07-03 ENCOUNTER — Encounter: Payer: Self-pay | Admitting: *Deleted

## 2018-07-09 ENCOUNTER — Inpatient Hospital Stay (HOSPITAL_BASED_OUTPATIENT_CLINIC_OR_DEPARTMENT_OTHER): Payer: Medicare Other | Admitting: Internal Medicine

## 2018-07-09 ENCOUNTER — Other Ambulatory Visit: Payer: Self-pay

## 2018-07-09 ENCOUNTER — Telehealth: Payer: Self-pay | Admitting: *Deleted

## 2018-07-09 ENCOUNTER — Inpatient Hospital Stay: Payer: Medicare Other | Attending: Internal Medicine

## 2018-07-09 ENCOUNTER — Encounter: Payer: Self-pay | Admitting: Internal Medicine

## 2018-07-09 ENCOUNTER — Inpatient Hospital Stay: Payer: Medicare Other

## 2018-07-09 VITALS — BP 124/58 | HR 42 | Temp 96.2°F | Resp 18 | Wt 172.0 lb

## 2018-07-09 DIAGNOSIS — F17210 Nicotine dependence, cigarettes, uncomplicated: Secondary | ICD-10-CM

## 2018-07-09 DIAGNOSIS — C9112 Chronic lymphocytic leukemia of B-cell type in relapse: Secondary | ICD-10-CM

## 2018-07-09 DIAGNOSIS — R109 Unspecified abdominal pain: Secondary | ICD-10-CM

## 2018-07-09 DIAGNOSIS — C9192 Lymphoid leukemia, unspecified, in relapse: Secondary | ICD-10-CM

## 2018-07-09 DIAGNOSIS — D631 Anemia in chronic kidney disease: Secondary | ICD-10-CM

## 2018-07-09 DIAGNOSIS — N183 Chronic kidney disease, stage 3 unspecified: Secondary | ICD-10-CM

## 2018-07-09 DIAGNOSIS — N189 Chronic kidney disease, unspecified: Secondary | ICD-10-CM | POA: Diagnosis not present

## 2018-07-09 DIAGNOSIS — D649 Anemia, unspecified: Secondary | ICD-10-CM | POA: Insufficient documentation

## 2018-07-09 DIAGNOSIS — C8300 Small cell B-cell lymphoma, unspecified site: Secondary | ICD-10-CM

## 2018-07-09 DIAGNOSIS — G8929 Other chronic pain: Secondary | ICD-10-CM | POA: Diagnosis not present

## 2018-07-09 DIAGNOSIS — D72829 Elevated white blood cell count, unspecified: Secondary | ICD-10-CM

## 2018-07-09 LAB — BASIC METABOLIC PANEL
Anion gap: 10 (ref 5–15)
BUN: 22 mg/dL (ref 8–23)
CO2: 23 mmol/L (ref 22–32)
Calcium: 8.7 mg/dL — ABNORMAL LOW (ref 8.9–10.3)
Chloride: 107 mmol/L (ref 98–111)
Creatinine, Ser: 1.54 mg/dL — ABNORMAL HIGH (ref 0.61–1.24)
GFR, EST AFRICAN AMERICAN: 53 mL/min — AB (ref >60)
GFR, EST NON AFRICAN AMERICAN: 45 mL/min — AB (ref >60)
Glucose, Bld: 107 mg/dL — ABNORMAL HIGH (ref 70–99)
Potassium: 4.4 mmol/L (ref 3.5–5.1)
Sodium: 140 mmol/L (ref 135–145)

## 2018-07-09 LAB — CBC WITH DIFFERENTIAL/PLATELET
Abs Immature Granulocytes: 0.05 10*3/uL (ref 0.00–0.07)
Basophils Absolute: 0.2 10*3/uL — ABNORMAL HIGH (ref 0.0–0.1)
Basophils Relative: 1 %
EOS PCT: 1 %
Eosinophils Absolute: 0.2 10*3/uL (ref 0.0–0.5)
HCT: 29.4 % — ABNORMAL LOW (ref 39.0–52.0)
Hemoglobin: 8.7 g/dL — ABNORMAL LOW (ref 13.0–17.0)
Immature Granulocytes: 0 %
Lymphocytes Relative: 82 %
Lymphs Abs: 21.5 10*3/uL — ABNORMAL HIGH (ref 0.7–4.0)
MCH: 32.5 pg (ref 26.0–34.0)
MCHC: 29.6 g/dL — ABNORMAL LOW (ref 30.0–36.0)
MCV: 109.7 fL — ABNORMAL HIGH (ref 80.0–100.0)
Monocytes Absolute: 1.3 10*3/uL — ABNORMAL HIGH (ref 0.1–1.0)
Monocytes Relative: 5 %
Neutro Abs: 2.9 10*3/uL (ref 1.7–7.7)
Neutrophils Relative %: 11 %
PLATELETS: 120 10*3/uL — AB (ref 150–400)
RBC: 2.68 MIL/uL — ABNORMAL LOW (ref 4.22–5.81)
RDW: 16.8 % — ABNORMAL HIGH (ref 11.5–15.5)
WBC: 26.2 10*3/uL — ABNORMAL HIGH (ref 4.0–10.5)
nRBC: 0 % (ref 0.0–0.2)

## 2018-07-09 MED ORDER — OXYCODONE-ACETAMINOPHEN 5-325 MG PO TABS: ORAL_TABLET | ORAL | 0 refills | Status: DC

## 2018-07-09 MED ORDER — DARBEPOETIN ALFA 300 MCG/0.6ML IJ SOSY
300.0000 ug | PREFILLED_SYRINGE | Freq: Once | INTRAMUSCULAR | Status: AC
  Administered 2018-07-09: 300 ug via SUBCUTANEOUS
  Filled 2018-07-09: qty 0.6

## 2018-07-09 NOTE — Telephone Encounter (Signed)
[facility] called to report that patient PCP gave him prescription for Oxycodone  On 3/5 # 30 tabs to take once daily. Wanted to make sure Dr B is aware of this and get call back of whether or not to fill the Oxycodone sent in today. Please advise

## 2018-07-09 NOTE — Progress Notes (Signed)
Patient here for follow up. No concerns voiced.  °

## 2018-07-09 NOTE — Telephone Encounter (Signed)
As patient is on a pain contract with our office, I advised pharmacy to hold off on filling patient prescription from [place] today until she hears back from [place] as Dr Rogue Bussing is off this afternoon.

## 2018-07-09 NOTE — Progress Notes (Signed)
[facility] [place] OFFICE PROGRESS NOTE  Patient Care Team: [name], MD as PCP - General (Internal Medicine)  Cancer Staging No matching staging information was found for the patient.   Oncology History   # 2006- CLL STAGE IV; MAY 2011- WBC- 57K;Platelets-99;Hb-12/CT Bulky LN; BMBx- 80% Invol; del 11; START Benda-Ritux x4 [finished Sep 2011];   # July 2015-Progression; Sep 2015-START ibrutinib; CT scan DEC 2015- Improvement LN; Cont Ibrutinib 163m/d; NOV 2016 CT- 1-2CM LN [mild progression compared to Dec 2015];NOV 2016- FISH peripheral blood- NO MUTATIONS/CD-38 Positive; NOV 7th- CONT IBRUTINIB 2 pills/day; CT AUG 2017- STABLE;  DEC 6th PET- Mild RP LN/ Retrocrural LN  # OFF ibrutinib [? intol]- sep 2019- Jan 16th 2020; Re-start Ibrutinib   # Jan 16th 2020- start aranesp  # DEC 2019- PAIN CONTRACT  #October 2019-bone marrow biopsy [worsening anemia]-question dyserythropoietic changes/small clone of CLL;  # [facility] HEM- NEG.  SA skin infection [Oct 2016] s/p clinda -------------------------------------------------------    DIAGNOSIS: CLL  STAGE:   IV      ;GOALS: palliative  CURRENT/MOST RECENT THERAPY : Ibrutinib.      CLL (chronic lymphoid leukemia) in relapse ([facility]    INTERVAL HISTORY:  [name]89y.o.  male pleasant patient above history of CLL [currently off ibrutinib] and chronic abdominal pain of unclear etiology; worsening anemia of unclear etiology/? CKD is here for follow-up.  Patient complains of continued abdominal pain.  For which he is taking Percocet.  He continues pain improvement.  Patient has not had a blood transfusion for the last 3 months.  He continues very Aranesp.  Review of Systems  Constitutional: Positive for malaise/fatigue and weight loss. Negative for chills, diaphoresis and fever.  HENT: Negative for nosebleeds and sore throat.   Eyes: Negative for double vision.  Respiratory: Negative for  cough, hemoptysis, sputum production, shortness of breath and wheezing.   Cardiovascular: Negative for chest pain, palpitations, orthopnea and leg swelling.  Gastrointestinal: Positive for abdominal pain and nausea. Negative for blood in stool, constipation, diarrhea, heartburn, melena and vomiting.  Genitourinary: Negative for dysuria, frequency and urgency.  Skin: Negative.  Negative for itching and rash.  Neurological: Negative for dizziness, tingling, focal weakness, weakness and headaches.  Endo/Heme/Allergies: Does not bruise/bleed easily.  Psychiatric/Behavioral: Negative for depression. The patient is not nervous/anxious and does not have insomnia.       PAST MEDICAL HISTORY :  Past Medical History:  Diagnosis Date  . [place]   . Diabetes mellitus without complication ([place]   . Hematuria   . Hypertension     PAST SURGICAL HISTORY :   Past Surgical History:  Procedure Laterality Date  . CATARACT EXTRACTION W/ INTRAOCULAR LENS IMPLANT Bilateral   . CHOLECYSTECTOMY  1983  . COLONOSCOPY WITH PROPOFOL N/A 10/27/2017   Procedure: COLONOSCOPY WITH PROPOFOL;  Surgeon: EManya Silvas MD;  [facility];  Service: Endoscopy;  Laterality: N/A;  . ESOPHAGOGASTRODUODENOSCOPY (EGD) WITH PROPOFOL N/A 11/20/2016   Procedure: ESOPHAGOGASTRODUODENOSCOPY (EGD) WITH PROPOFOL;  Surgeon: EManya Silvas MD;  [facility];  Service: Endoscopy;  Laterality: N/A;  . ESOPHAGOGASTRODUODENOSCOPY (EGD) WITH PROPOFOL N/A 10/27/2017   Procedure: ESOPHAGOGASTRODUODENOSCOPY (EGD) WITH PROPOFOL;  Surgeon: EManya Silvas MD;  [facility];  Service: Endoscopy;  Laterality: N/A;  . LOWER EXTREMITY ANGIOGRAPHY Right 05/19/2017   Procedure: LOWER EXTREMITY ANGIOGRAPHY;  Surgeon: DAlgernon Huxley MD;  [facility] [place];  Service: Cardiovascular;  Laterality:  Right;    FAMILY HISTORY :   Family History  Problem Relation Age of  Onset  . Hypertension Sister     SOCIAL HISTORY:   Social History   Tobacco Use  . Smoking status: Current Every Day Smoker    Packs/day: 0.50    Years: 47.00    Pack years: 23.50    Types: Cigarettes  . Smokeless tobacco: Never Used  Substance Use Topics  . Alcohol use: Yes    Alcohol/week: 1.0 standard drinks    Types: 1 Glasses of wine per week    Comment:  almost none in last 6 months  . Drug use: No    ALLERGIES:  has No Known Allergies.  MEDICATIONS:  Current Outpatient Medications  Medication Sig Dispense Refill  . amLODipine (NORVASC) 10 MG tablet Take 1 tablet (10 mg total) by mouth daily. 30 tablet 3  . aspirin EC 81 MG tablet Take 81 mg by mouth daily.    Marland Kitchen atorvastatin (LIPITOR) 10 MG tablet Take 1 tablet (10 mg total) by mouth daily. 30 tablet 11  . clopidogrel (PLAVIX) 75 MG tablet Take 1 tablet (75 mg total) by mouth daily. 30 tablet 11  . IMBRUVICA 280 MG TABS TAKE 1 TABLET BY MOUTH DAILY. 28 tablet 6  . lisinopril (PRINIVIL,ZESTRIL) 30 MG tablet Take 30 mg by mouth daily.  1  . metFORMIN (GLUCOPHAGE) 1000 MG tablet Take 1,000 mg by mouth 2 (two) times daily.    . metoprolol tartrate (LOPRESSOR) 25 MG tablet TAKE 1 TABLET BY MOUTH TWICE DAILY 180 tablet 1  . omeprazole (PRILOSEC) 40 MG capsule Take 40 mg by mouth daily.     Marland Kitchen oxyCODONE-acetaminophen (PERCOCET/ROXICET) 5-325 MG tablet 1 pill every 8-12 hours 90 tablet 0  . sodium polystyrene (SPS) 15 GM/60ML suspension TAKE AS DIRECTED (30 GRAMS) FOR 1 DOSE    . iron polysaccharides (NU-IRON) 150 MG capsule Take 150 mg by mouth daily.      No current facility-administered medications for this visit.     PHYSICAL EXAMINATION: ECOG PERFORMANCE STATUS: 1 - Symptomatic but completely ambulatory  BP (!) 124/58 (BP Location: Right Arm)   Pulse (!) 42 Comment: manually  Temp (!) 96.2 F (35.7 C)   Resp 18   Wt 172 lb (78 kg)   SpO2 100%   BMI 26.15 kg/m   Filed Weights   07/09/18 1109  Weight: 172 lb  (78 kg)    Physical Exam  Constitutional: He is oriented to person, place, and time and well-developed, well-nourished, and in no distress.  He is alone.  Appears pale.  HENT:  Head: Normocephalic and atraumatic.  Mouth/Throat: Oropharynx is clear and moist. No oropharyngeal exudate.  Eyes: Pupils are equal, round, and reactive to light.  Neck: Normal range of motion. Neck supple.  Cardiovascular: Normal rate and regular rhythm.  Pulmonary/Chest: No respiratory distress. He has no wheezes.  Decreased air entry bil.   Abdominal: Soft. Bowel sounds are normal. He exhibits no distension and no mass. There is no abdominal tenderness. There is no rebound and no guarding.  Musculoskeletal: Normal range of motion.        General: No tenderness or edema.  Neurological: He is alert and oriented to person, place, and time.  Skin: Skin is warm. There is pallor.  Psychiatric: Affect normal.      LABORATORY DATA:  I have reviewed the data as listed    Component Value Date/Time   NA 140 07/09/2018 1043  NA 142 05/03/2014 1139   K 4.4 07/09/2018 1043   K 4.7 05/03/2014 1139   CL 107 07/09/2018 1043   CL 110 (H) 05/03/2014 1139   CO2 23 07/09/2018 1043   CO2 24 05/03/2014 1139   GLUCOSE 107 (H) 07/09/2018 1043   GLUCOSE 98 05/03/2014 1139   BUN 22 07/09/2018 1043   BUN 20 (H) 05/03/2014 1139   CREATININE 1.54 (H) 07/09/2018 1043   CREATININE 1.22 08/09/2014 1122   CALCIUM 8.7 (L) 07/09/2018 1043   CALCIUM 8.6 05/03/2014 1139   PROT 7.0 06/11/2018 0938   PROT 7.5 05/03/2014 1139   ALBUMIN 4.3 06/11/2018 0938   ALBUMIN 4.1 05/03/2014 1139   AST 12 (L) 06/11/2018 0938   AST 15 05/03/2014 1139   ALT 11 06/11/2018 0938   ALT 26 05/03/2014 1139   ALKPHOS 65 06/11/2018 0938   ALKPHOS 94 05/03/2014 1139   BILITOT 0.4 06/11/2018 0938   BILITOT 0.5 05/03/2014 1139   GFRNONAA 45 (L) 07/09/2018 1043   GFRNONAA >60 08/09/2014 1122   GFRAA 53 (L) 07/09/2018 1043   GFRAA >60 08/09/2014  1122    No results found for: SPEP, UPEP  Lab Results  Component Value Date   WBC 26.2 (H) 07/09/2018   NEUTROABS 2.9 07/09/2018   HGB 8.7 (L) 07/09/2018   HCT 29.4 (L) 07/09/2018   MCV 109.7 (H) 07/09/2018   PLT 120 (L) 07/09/2018      Chemistry      Component Value Date/Time   NA 140 07/09/2018 1043   NA 142 05/03/2014 1139   K 4.4 07/09/2018 1043   K 4.7 05/03/2014 1139   CL 107 07/09/2018 1043   CL 110 (H) 05/03/2014 1139   CO2 23 07/09/2018 1043   CO2 24 05/03/2014 1139   BUN 22 07/09/2018 1043   BUN 20 (H) 05/03/2014 1139   CREATININE 1.54 (H) 07/09/2018 1043   CREATININE 1.22 08/09/2014 1122      Component Value Date/Time   CALCIUM 8.7 (L) 07/09/2018 1043   CALCIUM 8.6 05/03/2014 1139   ALKPHOS 65 06/11/2018 0938   ALKPHOS 94 05/03/2014 1139   AST 12 (L) 06/11/2018 0938   AST 15 05/03/2014 1139   ALT 11 06/11/2018 0938   ALT 26 05/03/2014 1139   BILITOT 0.4 06/11/2018 0938   BILITOT 0.5 05/03/2014 1139       RADIOGRAPHIC STUDIES: I have personally reviewed the radiological images as listed and agreed with the findings in the report. No results found.   ASSESSMENT & PLAN:  CLL (chronic lymphoid leukemia) in relapse ([place]) # CLL/SLL- relapsed currently off ibrutinib [sec ton intolerance in sep 2019]; SEP 24th 2019-CT scan shows no progressive/worsening lymphadenopathy.  Ibrutinib 280 mg once a day.  Stable.Marland Kitchen   #Leukocytosis - 26,000/ lymphocytosis-likely spurious secondary to ibrutinib.  #Anemia hemoglobin 8.7-unclear etiology/CKD. continue to 300 mcg q 2 weeks.  Stable.  # Abdominal pain/dyspepsia/unclear etiology; STABLE.  continue Percocet for now.  New prescription given.    # DISPOSITION:  # proceed with aranesp. # in 2 weeks- H&H weeks/possible aranesp.   # in 4 weeks- H&H possible aranesp # in 6 weeks- MD/ cbc/bmp/possible aranesp-Dr.B  Addendum: After patient received narcotic pain prescription from [place] it was notified from pharmacy that he  received previously Percocet prescription PCP.  Patient had signed a pain contract that he will not get pain medication from anybody else apart from [place].  This will be related to the patient at the next visit.  No orders of the defined types were placed in this encounter.  All questions were answered. The patient knows to call the clinic with any problems, questions or concerns.      Cammie Sickle, MD 07/12/2018 10:30 AM

## 2018-07-09 NOTE — Assessment & Plan Note (Addendum)
#   CLL/SLL- relapsed currently off ibrutinib [sec ton intolerance in sep 2019]; SEP 24th 2019-CT scan shows no progressive/worsening lymphadenopathy.  Ibrutinib 280 mg once a day.  Stable.Marland Kitchen   #Leukocytosis - 26,000/ lymphocytosis-likely spurious secondary to ibrutinib.  #Anemia hemoglobin 8.7-unclear etiology/CKD. continue to 300 mcg q 2 weeks.  Stable.  # Abdominal pain/dyspepsia/unclear etiology; STABLE.  continue Percocet for now.  New prescription given.    # DISPOSITION:  # proceed with aranesp. # in 2 weeks- H&H weeks/possible aranesp.   # in 4 weeks- H&H possible aranesp # in 6 weeks- MD/ cbc/bmp/possible aranesp-Dr.B  Addendum: After patient received narcotic pain prescription from [place] it was notified from pharmacy that he received previously Percocet prescription PCP.  Patient had signed a pain contract that he will not get pain medication from anybody else apart from [place].  This will be related to the patient at the next visit.

## 2018-07-10 NOTE — Telephone Encounter (Signed)
OK to go ahead and refill prescription this time. Pharmacy notified

## 2018-07-20 ENCOUNTER — Other Ambulatory Visit (INDEPENDENT_AMBULATORY_CARE_PROVIDER_SITE_OTHER): Payer: Self-pay | Admitting: Vascular Surgery

## 2018-07-20 NOTE — Telephone Encounter (Signed)
I am getting a flag because the patient is also taking Omeprazole. Please advise.

## 2018-07-22 ENCOUNTER — Other Ambulatory Visit: Payer: Self-pay

## 2018-07-22 DIAGNOSIS — N183 Chronic kidney disease, stage 3 unspecified: Secondary | ICD-10-CM

## 2018-07-22 DIAGNOSIS — D631 Anemia in chronic kidney disease: Secondary | ICD-10-CM

## 2018-07-23 ENCOUNTER — Inpatient Hospital Stay: Payer: Medicare Other

## 2018-07-23 ENCOUNTER — Other Ambulatory Visit: Payer: Self-pay

## 2018-07-23 VITALS — BP 163/69 | HR 65

## 2018-07-23 DIAGNOSIS — N183 Chronic kidney disease, stage 3 unspecified: Secondary | ICD-10-CM

## 2018-07-23 DIAGNOSIS — D631 Anemia in chronic kidney disease: Secondary | ICD-10-CM

## 2018-07-23 DIAGNOSIS — N189 Chronic kidney disease, unspecified: Secondary | ICD-10-CM | POA: Diagnosis not present

## 2018-07-23 LAB — HEMOGLOBIN: Hemoglobin: 9.4 g/dL — ABNORMAL LOW (ref 13.0–17.0)

## 2018-07-23 LAB — HEMATOCRIT: HCT: 30.9 % — ABNORMAL LOW (ref 39.0–52.0)

## 2018-07-23 MED ORDER — DARBEPOETIN ALFA 300 MCG/0.6ML IJ SOSY
300.0000 ug | PREFILLED_SYRINGE | Freq: Once | INTRAMUSCULAR | Status: AC
  Administered 2018-07-23: 300 ug via SUBCUTANEOUS
  Filled 2018-07-23: qty 0.6

## 2018-07-23 NOTE — Progress Notes (Signed)
Per Rogue Bussing ok to give injection with bp 163/69

## 2018-07-24 MED FILL — IMBRUVICA 280 MG TAB: 280 | 28 days supply | Qty: 28 | Fill #2

## 2018-07-26 ENCOUNTER — Other Ambulatory Visit: Payer: Self-pay | Admitting: Internal Medicine

## 2018-08-01 ENCOUNTER — Other Ambulatory Visit: Payer: Self-pay | Admitting: Internal Medicine

## 2018-08-03 ENCOUNTER — Telehealth (INDEPENDENT_AMBULATORY_CARE_PROVIDER_SITE_OTHER): Payer: Self-pay | Admitting: Vascular Surgery

## 2018-08-03 NOTE — Telephone Encounter (Signed)
Bring him in with James [name] or dew

## 2018-08-04 ENCOUNTER — Encounter (INDEPENDENT_AMBULATORY_CARE_PROVIDER_SITE_OTHER): Payer: Self-pay

## 2018-08-04 ENCOUNTER — Other Ambulatory Visit: Payer: Self-pay

## 2018-08-04 ENCOUNTER — Encounter (INDEPENDENT_AMBULATORY_CARE_PROVIDER_SITE_OTHER): Payer: Self-pay | Admitting: Vascular Surgery

## 2018-08-04 ENCOUNTER — Ambulatory Visit (INDEPENDENT_AMBULATORY_CARE_PROVIDER_SITE_OTHER): Payer: Medicare Other

## 2018-08-04 ENCOUNTER — Ambulatory Visit (INDEPENDENT_AMBULATORY_CARE_PROVIDER_SITE_OTHER): Payer: Medicare Other | Admitting: Vascular Surgery

## 2018-08-04 ENCOUNTER — Other Ambulatory Visit (INDEPENDENT_AMBULATORY_CARE_PROVIDER_SITE_OTHER): Payer: Self-pay | Admitting: Nurse Practitioner

## 2018-08-04 VITALS — BP 135/76 | HR 50 | Resp 15 | Ht 68.0 in | Wt 166.0 lb

## 2018-08-04 DIAGNOSIS — Z79899 Other long term (current) drug therapy: Secondary | ICD-10-CM

## 2018-08-04 DIAGNOSIS — M79672 Pain in left foot: Secondary | ICD-10-CM | POA: Diagnosis not present

## 2018-08-04 DIAGNOSIS — I70245 Atherosclerosis of native arteries of left leg with ulceration of other part of foot: Secondary | ICD-10-CM | POA: Diagnosis not present

## 2018-08-04 DIAGNOSIS — E785 Hyperlipidemia, unspecified: Secondary | ICD-10-CM | POA: Diagnosis not present

## 2018-08-04 DIAGNOSIS — M79671 Pain in right foot: Secondary | ICD-10-CM | POA: Diagnosis not present

## 2018-08-04 DIAGNOSIS — N183 Chronic kidney disease, stage 3 unspecified: Secondary | ICD-10-CM | POA: Insufficient documentation

## 2018-08-04 DIAGNOSIS — I10 Essential (primary) hypertension: Secondary | ICD-10-CM

## 2018-08-04 DIAGNOSIS — L989 Disorder of the skin and subcutaneous tissue, unspecified: Secondary | ICD-10-CM

## 2018-08-04 DIAGNOSIS — F17210 Nicotine dependence, cigarettes, uncomplicated: Secondary | ICD-10-CM

## 2018-08-04 DIAGNOSIS — L97529 Non-pressure chronic ulcer of other part of left foot with unspecified severity: Secondary | ICD-10-CM | POA: Diagnosis not present

## 2018-08-04 DIAGNOSIS — E118 Type 2 diabetes mellitus with unspecified complications: Secondary | ICD-10-CM

## 2018-08-04 DIAGNOSIS — Z7984 Long term (current) use of oral hypoglycemic drugs: Secondary | ICD-10-CM

## 2018-08-04 DIAGNOSIS — I7025 Atherosclerosis of native arteries of other extremities with ulceration: Secondary | ICD-10-CM | POA: Insufficient documentation

## 2018-08-04 DIAGNOSIS — E1122 Type 2 diabetes mellitus with diabetic chronic kidney disease: Secondary | ICD-10-CM

## 2018-08-04 DIAGNOSIS — I129 Hypertensive chronic kidney disease with stage 1 through stage 4 chronic kidney disease, or unspecified chronic kidney disease: Secondary | ICD-10-CM | POA: Diagnosis not present

## 2018-08-04 NOTE — Assessment & Plan Note (Signed)
His noninvasive studies today shows stable good perfusion on the right at 1.01 but a drop in his left ABI down to 0.58 with a digital pressure of only 35.  He now describes symptoms concerning for rest pain and he has a nonhealing ulceration.  This is now a critical and limb threatening situation and has deteriorated from his visit earlier this year.  As such, he should proceed with angiography with possible revascularization.  I discussed the risks and benefits the procedures.  The patient voices his understanding and is agreeable to proceed.

## 2018-08-04 NOTE — Assessment & Plan Note (Signed)
We will try to limit contrast with his procedure, but this is a critical and limb threatening situation.

## 2018-08-04 NOTE — Patient Instructions (Signed)
Peripheral Vascular Disease  Peripheral vascular disease (PVD) is a disease of the blood vessels that are not part of your heart and brain. A simple term for PVD is poor circulation. In most cases, PVD narrows the blood vessels that carry blood from your heart to the rest of your body. This can reduce the supply of blood to your arms, legs, and internal organs, like your stomach or kidneys. However, PVD most often affects a person's lower legs and feet. Without treatment, PVD tends to get worse. PVD can also lead to acute ischemic limb. This is when an arm or leg suddenly cannot get enough blood. This is a medical emergency. Follow these instructions at home: Lifestyle  Do not use any products that contain nicotine or tobacco, such as cigarettes and e-cigarettes. If you need help quitting, ask your doctor.  Lose weight if you are overweight. Or, stay at a healthy weight as told by your doctor.  Eat a diet that is low in fat and cholesterol. If you need help, ask your doctor.  Exercise regularly. Ask your doctor for activities that are right for you. General instructions  Take over-the-counter and prescription medicines only as told by your doctor.  Take good care of your feet: ? Wear comfortable shoes that fit well. ? Check your feet often for any cuts or sores.  Keep all follow-up visits as told by your doctor This is important. Contact a doctor if:  You have cramps in your legs when you walk.  You have leg pain when you are at rest.  You have coldness in a leg or foot.  Your skin changes.  You are unable to get or have an erection (erectile dysfunction).  You have cuts or sores on your feet that do not heal. Get help right away if:  Your arm or leg turns cold, numb, and blue.  Your arms or legs become red, warm, swollen, painful, or numb.  You have chest pain.  You have trouble breathing.  You suddenly have weakness in your face, arm, or leg.  You become very  confused or you cannot speak.  You suddenly have a very bad headache.  You suddenly cannot see. Summary  Peripheral vascular disease (PVD) is a disease of the blood vessels.  A simple term for PVD is poor circulation. Without treatment, PVD tends to get worse.  Treatment may include exercise, low fat and low cholesterol diet, and quitting smoking. This information is not intended to replace advice given to you by your health care provider. Make sure you discuss any questions you have with your health care provider. Document Released: 07/10/2009 Document Revised: 05/23/2016 Document Reviewed: 05/23/2016 Elsevier Interactive Patient Education  2019 [employer].  

## 2018-08-04 NOTE — Assessment & Plan Note (Signed)
lipid control important in reducing the progression of atherosclerotic disease. Continue statin therapy  

## 2018-08-04 NOTE — Progress Notes (Signed)
MRN : [id number]  James [name] is a 67 y.o. (1950-07-12) male who presents with chief complaint of  Chief Complaint  Patient presents with  . Follow-up    ultrasound follow up  .  History of Present Illness: Patient returns today in follow up of his PAD earlier than his scheduled visit.  He is having pain that is keeping him from going to sleep at night in his left foot.  He has developed a small ulceration on his left fifth toe.  These are new and a significant change for the worse since his visit earlier this year.  At that time, he was having claudication.  His right leg remains reasonably normal and symptom-free at this point.  He had this treated a little over a year ago.  No fevers or chills.  He does have very short distance claudication but is still present. His noninvasive studies today shows stable good perfusion on the right at 1.01 but a drop in his left ABI down to 0.58 with a digital pressure of only 35.  Current Outpatient Medications  Medication Sig Dispense Refill  . amLODipine (NORVASC) 10 MG tablet Take 1 tablet by mouth once daily 30 tablet 0  . aspirin EC 81 MG tablet Take 81 mg by mouth daily.    Marland Kitchen atorvastatin (LIPITOR) 10 MG tablet Take 1 tablet (10 mg total) by mouth daily. 30 tablet 11  . clopidogrel (PLAVIX) 75 MG tablet Take 1 tablet by mouth once daily 90 tablet 0  . IMBRUVICA 280 MG TABS TAKE 1 TABLET BY MOUTH DAILY. 28 tablet 6  . lisinopril (PRINIVIL,ZESTRIL) 30 MG tablet Take 30 mg by mouth daily.  1  . metFORMIN (GLUCOPHAGE) 1000 MG tablet Take 1,000 mg by mouth 2 (two) times daily.    . metoprolol tartrate (LOPRESSOR) 25 MG tablet Take 1 tablet by mouth twice daily 180 tablet 0  . omeprazole (PRILOSEC) 40 MG capsule Take 40 mg by mouth daily.     Marland Kitchen oxyCODONE-acetaminophen (PERCOCET/ROXICET) 5-325 MG tablet 1 pill every 8-12 hours 90 tablet 0  . sodium polystyrene (SPS) 15 GM/60ML suspension TAKE AS DIRECTED (30 GRAMS) FOR 1 DOSE    . iron  polysaccharides (NU-IRON) 150 MG capsule Take 150 mg by mouth daily.      No current facility-administered medications for this visit.     Past Medical History:  Diagnosis Date  . CLL (chronic lymphocytic leukemia) ([place])   . Diabetes mellitus without complication ([place])   . Hematuria   . Hypertension     Past Surgical History:  Procedure Laterality Date  . CATARACT EXTRACTION W/ INTRAOCULAR LENS IMPLANT Bilateral   . CHOLECYSTECTOMY  1983  . COLONOSCOPY WITH PROPOFOL N/A 10/27/2017   Procedure: COLONOSCOPY WITH PROPOFOL;  Surgeon: Manya Silvas, MD;  Location: [facility] ENDOSCOPY;  Service: Endoscopy;  Laterality: N/A;  . ESOPHAGOGASTRODUODENOSCOPY (EGD) WITH PROPOFOL N/A 11/20/2016   Procedure: ESOPHAGOGASTRODUODENOSCOPY (EGD) WITH PROPOFOL;  Surgeon: Manya Silvas, MD;  Location[facility] ENDOSCOPY;  Service: Endoscopy;  Laterality: N/A;  . ESOPHAGOGASTRODUODENOSCOPY (EGD) WITH PROPOFOL N/A 10/27/2017   Procedure: ESOPHAGOGASTRODUODENOSCOPY (EGD) WITH PROPOFOL;  Surgeon: Manya Silvas, MD;  Location: [facility] ENDOSCOPY;  Service: Endoscopy;  Laterality: N/A;  . LOWER EXTREMITY ANGIOGRAPHY Right 05/19/2017   Procedure: LOWER EXTREMITY ANGIOGRAPHY;  Surgeon: Algernon Huxley, MD;  Location: [facility];  Service: Cardiovascular;  Laterality: Right;   Social History        Tobacco Use  . Smoking status:  Current Every Day Smoker    Packs/day: 1.00    Years: 47.00    Pack years: 47.00    Types: Cigarettes  . Smokeless tobacco: Never Used  Substance Use Topics  . Alcohol use: Yes    Alcohol/week: 3.0 oz    Types: 5 Glasses of wine per week    Comment: 5 a week  . Drug use: No    Family History      Family History  Problem Relation Age of Onset  . Hypertension Sister   No bleeding disorders, clotting disorders, or aneurysms  No Known Allergies   REVIEW OF SYSTEMS(Negative unless checked)  Constitutional: [] ??Weight  loss[] ??Fever[] ??Chills Cardiac:[] ??Chest pain[] ??Chest pressure[] ??Palpitations [] ??Shortness of breath when laying flat [] ??Shortness of breath at rest [] ??Shortness of breath with exertion. Vascular: [x] ??Pain in legs with walking[] ??Pain in legsat rest[] ??Pain in legs when laying flat [x] ??Claudication [] ??Pain in feet when walking [] ??Pain in feet at rest [] ??Pain in feet when laying flat [] ??History of DVT [] ??Phlebitis [x] ??Swelling in legs [] ??Varicose veins [x] ??Non-healing ulcers Pulmonary: [] ??Uses home oxygen [] ??Productive cough[] ??Hemoptysis [] ??Wheeze [] ??COPD [] ??Asthma Neurologic: [] ??Dizziness [] ??Blackouts [] ??Seizures [] ??History of stroke [] ??History of TIA[] ??Aphasia [] ??Temporary blindness[] ??Dysphagia [] ??Weaknessor numbness in arms [] ??Weakness or numbnessin legs Musculoskeletal: [x] ??Arthritis [] ??Joint swelling [] ??Joint pain [] ??Low back pain Hematologic:[] ??Easy bruising[] ??Easy bleeding [] ??Hypercoagulable state [x] ??Anemic  Gastrointestinal:[] ??Blood in stool[] ??Vomiting blood[x] ??Gastroesophageal reflux/heartburn[] ??Abdominal pain Genitourinary: [] ??Chronic kidney disease [] ??Difficulturination [] ??Frequenturination [] ??Burning with urination[] ??Hematuria Skin: [] ??Rashes [x] ??Ulcers [x] ??Wounds Psychological: [] ??History of anxiety[] ??History of major depression.    Physical Examination  BP 135/76 (BP Location: Right Arm)   Pulse (!) 50   Resp 15   Ht 5\' 8"  (1.727 m)   Wt 166 lb (75.3 kg)   BMI 25.24 kg/m  Gen:  WD/WN, NAD Head: [place]/AT, No temporalis wasting. Ear/Nose/Throat: Hearing grossly intact, nares w/o erythema or drainage Eyes: Conjunctiva clear. Sclera non-icteric Neck: Supple.  Trachea midline Pulmonary:  Good air movement, no use of accessory muscles.  Cardiac: RRR, no JVD Vascular:  Vessel Right Left  Radial Palpable  Palpable                          PT 1+ Palpable Not Palpable  DP 2+ Palpable 1+ Palpable   Gastrointestinal: soft, non-tender/non-distended. Musculoskeletal: M/S 5/5 throughout.  No deformity or atrophy. Small clean ulcer on the left fifth toe. No edema. Neurologic: Sensation grossly intact in extremities.  Symmetrical.  Speech is fluent.  Psychiatric: Judgment intact, Mood & affect appropriate for pt's clinical situation. Dermatologic: small clean ulcer on the left fifth toe.       Labs Recent Results (from the past 2160 hour(s))  Sample to Blood Bank     Status: None   Collection Time: 05/14/18  8:19 AM  Result Value Ref Range   Blood Bank Specimen SAMPLE AVAILABLE FOR TESTING    Sample Expiration      05/17/2018 Performed at [facility], [address] [name], [place] [address]   CBC with Differential/Platelet     Status: Abnormal   Collection Time: 05/14/18  8:19 AM  Result Value Ref Range   WBC 9.5 4.0 - 10.5 K/uL   RBC 2.69 (L) 4.22 - 5.81 MIL/uL   Hemoglobin 8.7 (L) 13.0 - 17.0 g/dL   HCT 29.1 (L) 39.0 - 52.0 %   MCV 108.2 (H) 80.0 - 100.0 fL   MCH 32.3 26.0 - 34.0 pg   MCHC 29.9 (L) 30.0 - 36.0 g/dL   RDW 16.9 (H) 11.5 - 15.5 %  Platelets 126 (L) 150 - 400 K/uL   nRBC 0.0 0.0 - 0.2 %   Neutrophils Relative % 28 %   Neutro Abs 2.7 1.7 - 7.7 K/uL   Lymphocytes Relative 54 %   Lymphs Abs 5.1 (H) 0.7 - 4.0 K/uL   Monocytes Relative 16 %   Monocytes Absolute 1.5 (H) 0.1 - 1.0 K/uL   Eosinophils Relative 1 %   Eosinophils Absolute 0.1 0.0 - 0.5 K/uL   Basophils Relative 1 %   Basophils Absolute 0.1 0.0 - 0.1 K/uL   Immature Granulocytes 0 %   Abs Immature Granulocytes 0.03 0.00 - 0.07 K/uL    Comment: Performed at [facility], [address] [place], [place] [address]  Comprehensive metabolic panel     Status: Abnormal   Collection Time: 05/14/18  8:19 AM  Result Value Ref Range   Sodium 144 135 - 145 mmol/L   Potassium 4.5 3.5 -  5.1 mmol/L   Chloride 110 98 - 111 mmol/L   CO2 26 22 - 32 mmol/L   Glucose, Bld 125 (H) 70 - 99 mg/dL   BUN 22 8 - 23 mg/dL   Creatinine, Ser 1.35 (H) 0.61 - 1.24 mg/dL   Calcium 8.8 (L) 8.9 - 10.3 mg/dL   Total Protein 6.8 6.5 - 8.1 g/dL   Albumin 4.3 3.5 - 5.0 g/dL   AST 12 (L) 15 - 41 U/L   ALT 7 0 - 44 U/L   Alkaline Phosphatase 90 38 - 126 U/L   Total Bilirubin 0.6 0.3 - 1.2 mg/dL   GFR calc non Af Amer 53 (L) >60 mL/min   GFR calc Af Amer >60 >60 mL/min   Anion gap 8 5 - 15    Comment: Performed at [facility], [address] [place], [place] [address]  Hemoglobin     Status: Abnormal   Collection Time: 05/28/18  9:19 AM  Result Value Ref Range   Hemoglobin 10.1 (L) 13.0 - 17.0 g/dL    Comment: Performed at [facility], [address] [place], [place] [address]  Hematocrit     Status: Abnormal   Collection Time: 05/28/18  9:19 AM  Result Value Ref Range   HCT 33.4 (L) 39.0 - 52.0 %    Comment: Performed at [facility], [address], [place], [place] [address]  Urine Drug Screen, Qualitative [facility])     Status: Abnormal   Collection Time: 05/28/18  9:19 AM  Result Value Ref Range   Tricyclic, Ur Screen NONE DETECTED NONE DETECTED   Amphetamines, Ur Screen NONE DETECTED NONE DETECTED   MDMA (Ecstasy)Ur Screen NONE DETECTED NONE DETECTED   Cocaine Metabolite,Ur [place] NONE DETECTED NONE DETECTED   Opiate, Ur Screen POSITIVE (A) NONE DETECTED   Phencyclidine (PCP) Ur S NONE DETECTED NONE DETECTED   Cannabinoid 50 Ng, Ur [place] NONE DETECTED NONE DETECTED   Barbiturates, Ur Screen NONE DETECTED NONE DETECTED   Benzodiazepine, Ur Scrn NONE DETECTED NONE DETECTED   Methadone Scn, Ur NONE DETECTED NONE DETECTED    Comment: (NOTE) Tricyclics + metabolites, urine    Cutoff 1000 ng/mL Amphetamines + metabolites, urine  Cutoff 1000 ng/mL MDMA (Ecstasy), urine              Cutoff 500 ng/mL Cocaine Metabolite, urine          Cutoff 300 ng/mL Opiate +  metabolites, urine        Cutoff 300 ng/mL Phencyclidine (PCP), urine  Cutoff 25 ng/mL Cannabinoid, urine                 Cutoff 50 ng/mL Barbiturates + metabolites, urine  Cutoff 200 ng/mL Benzodiazepine, urine              Cutoff 200 ng/mL Methadone, urine                   Cutoff 300 ng/mL The urine drug screen provides only a preliminary, unconfirmed analytical test result and should not be used for non-medical purposes. Clinical consideration and professional judgment should be applied to any positive drug screen result due to possible interfering substances. A more specific alternate chemical method must be used in order to obtain a confirmed analytical result. Gas chromatography / mass spectrometry (GC/MS) is the preferred confirmat ory method. Performed at [facility], [address] [place], [place] [address]   CBC with Differential/Platelet     Status: Abnormal   Collection Time: 06/11/18  9:38 AM  Result Value Ref Range   WBC 27.7 (H) 4.0 - 10.5 K/uL   RBC 2.67 (L) 4.22 - 5.81 MIL/uL   Hemoglobin 8.6 (L) 13.0 - 17.0 g/dL   HCT 28.5 (L) 39.0 - 52.0 %   MCV 106.7 (H) 80.0 - 100.0 fL   MCH 32.2 26.0 - 34.0 pg   MCHC 30.2 30.0 - 36.0 g/dL   RDW 15.9 (H) 11.5 - 15.5 %   Platelets 72 (L) 150 - 400 K/uL   nRBC 0.1 0.0 - 0.2 %   Neutrophils Relative % 25 %   Neutro Abs 6.9 1.7 - 7.7 K/uL   Lymphocytes Relative 62 %   Lymphs Abs 17.2 (H) 0.7 - 4.0 K/uL   Monocytes Relative 12 %   Monocytes Absolute 3.3 (H) 0.1 - 1.0 K/uL   Eosinophils Relative 0 %   Eosinophils Absolute 0.0 0.0 - 0.5 K/uL   Basophils Relative 1 %   Basophils Absolute 0.3 (H) 0.0 - 0.1 K/uL   WBC Morphology DIFF CONFIRMED BY MANUAL    Smear Review PLATELETS APPEAR DECREASED    Abs Immature Granulocytes 0.00 0.00 - 0.07 K/uL    Comment: Performed at [facility], [address] [place], [place] [address]  Comprehensive metabolic panel     Status: Abnormal   Collection Time:  06/11/18  9:38 AM  Result Value Ref Range   Sodium 139 135 - 145 mmol/L   Potassium 5.1 3.5 - 5.1 mmol/L   Chloride 111 98 - 111 mmol/L   CO2 23 22 - 32 mmol/L   Glucose, Bld 116 (H) 70 - 99 mg/dL   BUN 37 (H) 8 - 23 mg/dL   Creatinine, Ser 1.54 (H) 0.61 - 1.24 mg/dL   Calcium 8.5 (L) 8.9 - 10.3 mg/dL   Total Protein 7.0 6.5 - 8.1 g/dL   Albumin 4.3 3.5 - 5.0 g/dL   AST 12 (L) 15 - 41 U/L   ALT 11 0 - 44 U/L   Alkaline Phosphatase 65 38 - 126 U/L   Total Bilirubin 0.4 0.3 - 1.2 mg/dL   GFR calc non Af Amer 45 (L) >60 mL/min   GFR calc Af Amer 53 (L) >60 mL/min   Anion gap 5 5 - 15    Comment: Performed at [facility], [address] [place], [place] [address]  Iron and TIBC     Status: None   Collection Time: 06/11/18  9:38 AM  Result Value Ref Range  Iron 99 45 - 182 ug/dL   TIBC 334 250 - 450 ug/dL   Saturation Ratios 30 17.9 - 39.5 %   UIBC 236 ug/dL    Comment: Performed at [facility], [address] [place], [place] [address]  Ferritin     Status: None   Collection Time: 06/11/18  9:38 AM  Result Value Ref Range   Ferritin 60 24 - 336 ng/mL    Comment: Performed at [facility], [address] [place], [place] [address]  Hematocrit [facility])     Status: Abnormal   Collection Time: 06/25/18  9:38 AM  Result Value Ref Range   HCT 28.9 (L) 39.0 - 52.0 %    Comment: Performed at [facility], [address] [place], [place] [address]  Hemoglobin [facility])     Status: Abnormal   Collection Time: 06/25/18  9:38 AM  Result Value Ref Range   Hemoglobin 8.7 (L) 13.0 - 17.0 g/dL    Comment: Performed at [facility], [address] [place], [place] [address]  Basic metabolic panel     Status: Abnormal   Collection Time: 07/09/18 10:43 AM  Result Value Ref Range   Sodium 140 135 - 145 mmol/L   Potassium 4.4 3.5 - 5.1 mmol/L   Chloride 107 98 - 111 mmol/L   CO2 23 22 - 32 mmol/L   Glucose, Bld 107 (H) 70 - 99 mg/dL   BUN 22 8 -  23 mg/dL   Creatinine, Ser 1.54 (H) 0.61 - 1.24 mg/dL   Calcium 8.7 (L) 8.9 - 10.3 mg/dL   GFR calc non Af Amer 45 (L) >60 mL/min   GFR calc Af Amer 53 (L) >60 mL/min   Anion gap 10 5 - 15    Comment: Performed at [facility], [address] [place], [place] [address]  CBC with Differential     Status: Abnormal   Collection Time: 07/09/18 10:43 AM  Result Value Ref Range   WBC 26.2 (H) 4.0 - 10.5 K/uL   RBC 2.68 (L) 4.22 - 5.81 MIL/uL   Hemoglobin 8.7 (L) 13.0 - 17.0 g/dL   HCT 29.4 (L) 39.0 - 52.0 %   MCV 109.7 (H) 80.0 - 100.0 fL   MCH 32.5 26.0 - 34.0 pg   MCHC 29.6 (L) 30.0 - 36.0 g/dL   RDW 16.8 (H) 11.5 - 15.5 %   Platelets 120 (L) 150 - 400 K/uL   nRBC 0.0 0.0 - 0.2 %   Neutrophils Relative % 11 %   Neutro Abs 2.9 1.7 - 7.7 K/uL   Lymphocytes Relative 82 %   Lymphs Abs 21.5 (H) 0.7 - 4.0 K/uL   Monocytes Relative 5 %   Monocytes Absolute 1.3 (H) 0.1 - 1.0 K/uL   Eosinophils Relative 1 %   Eosinophils Absolute 0.2 0.0 - 0.5 K/uL   Basophils Relative 1 %   Basophils Absolute 0.2 (H) 0.0 - 0.1 K/uL   Immature Granulocytes 0 %   Abs Immature Granulocytes 0.05 0.00 - 0.07 K/uL    Comment: Performed at [facility], [address] [place], [place] [address]  Hematocrit     Status: Abnormal   Collection Time: 07/23/18 10:00 AM  Result Value Ref Range   HCT 30.9 (L) 39.0 - 52.0 %    Comment: Performed at [facility], [address] [place], [place] [address]  Hemoglobin     Status: Abnormal   Collection Time: 07/23/18 10:00  AM  Result Value Ref Range   Hemoglobin 9.4 (L) 13.0 - 17.0 g/dL    Comment: Performed at [facility], [address], [place], [place] [address]    Radiology No results found.  Assessment/Plan Essential hypertension blood pressure control important in reducing the progression of atherosclerotic disease. On appropriate oral medications.   Type 2 diabetes mellitus with complication (HCC) blood glucose control  important in reducing the progression of atherosclerotic disease. Also, involved in wound healing. On appropriate medications.  Hyperlipidemia lipid control important in reducing the progression of atherosclerotic disease. Continue statin therapy   CKD stage 3 secondary to diabetes [facility]) We will try to limit contrast with his procedure, but this is a critical and limb threatening situation.  Atherosclerosis of native arteries of the extremities with ulceration ([place]) His noninvasive studies today shows stable good perfusion on the right at 1.01 but a drop in his left ABI down to 0.58 with a digital pressure of only 35.  He now describes symptoms concerning for rest pain and he has a nonhealing ulceration.  This is now a critical and limb threatening situation and has deteriorated from his visit earlier this year.  As such, he should proceed with angiography with possible revascularization.  I discussed the risks and benefits the procedures.  The patient voices his understanding and is agreeable to proceed.    Leotis Pain, MD  08/04/2018 11:42 AM    This note was created with Dragon medical transcription system.  Any errors from dictation are purely unintentional

## 2018-08-05 ENCOUNTER — Other Ambulatory Visit: Payer: Self-pay

## 2018-08-06 ENCOUNTER — Other Ambulatory Visit: Payer: Self-pay | Admitting: *Deleted

## 2018-08-06 ENCOUNTER — Inpatient Hospital Stay: Payer: Medicare Other | Attending: Internal Medicine

## 2018-08-06 ENCOUNTER — Inpatient Hospital Stay: Payer: Medicare Other

## 2018-08-06 ENCOUNTER — Other Ambulatory Visit: Payer: Self-pay

## 2018-08-06 VITALS — BP 136/72 | HR 44

## 2018-08-06 DIAGNOSIS — D631 Anemia in chronic kidney disease: Secondary | ICD-10-CM

## 2018-08-06 DIAGNOSIS — N189 Chronic kidney disease, unspecified: Secondary | ICD-10-CM | POA: Insufficient documentation

## 2018-08-06 DIAGNOSIS — N183 Chronic kidney disease, stage 3 (moderate): Secondary | ICD-10-CM

## 2018-08-06 DIAGNOSIS — C8300 Small cell B-cell lymphoma, unspecified site: Secondary | ICD-10-CM

## 2018-08-06 DIAGNOSIS — C9192 Lymphoid leukemia, unspecified, in relapse: Secondary | ICD-10-CM

## 2018-08-06 DIAGNOSIS — C9112 Chronic lymphocytic leukemia of B-cell type in relapse: Secondary | ICD-10-CM

## 2018-08-06 LAB — HEMATOCRIT: HCT: 31.1 % — ABNORMAL LOW (ref 39.0–52.0)

## 2018-08-06 LAB — HEMOGLOBIN: Hemoglobin: 9.4 g/dL — ABNORMAL LOW (ref 13.0–17.0)

## 2018-08-06 MED ORDER — DARBEPOETIN ALFA 300 MCG/0.6ML IJ SOSY
300.0000 ug | PREFILLED_SYRINGE | Freq: Once | INTRAMUSCULAR | Status: AC
  Administered 2018-08-06: 11:00:00 300 ug via SUBCUTANEOUS
  Filled 2018-08-06: qty 0.6

## 2018-08-06 MED ORDER — OXYCODONE-ACETAMINOPHEN 5-325 MG PO TABS: ORAL_TABLET | ORAL | 0 refills | Status: DC

## 2018-08-06 NOTE — Telephone Encounter (Signed)
Patient came into the clinic for lab/injection. He is requesting a Rf on his Percocet. Thanks.

## 2018-08-09 ENCOUNTER — Other Ambulatory Visit (INDEPENDENT_AMBULATORY_CARE_PROVIDER_SITE_OTHER): Payer: Self-pay | Admitting: Nurse Practitioner

## 2018-08-10 ENCOUNTER — Ambulatory Visit
Admission: RE | Admit: 2018-08-10 | Discharge: 2018-08-10 | Disposition: A | Discharge status: Home / Self Care | Payer: Medicare Other | Attending: Vascular Surgery | Admitting: Vascular Surgery

## 2018-08-10 ENCOUNTER — Encounter: Payer: Self-pay | Admitting: *Deleted

## 2018-08-10 ENCOUNTER — Encounter
Admission: RE | Disposition: A | Discharge status: Home / Self Care | Payer: Self-pay | Source: Home / Self Care | Attending: Vascular Surgery

## 2018-08-10 DIAGNOSIS — Z79899 Other long term (current) drug therapy: Secondary | ICD-10-CM | POA: Diagnosis not present

## 2018-08-10 DIAGNOSIS — E785 Hyperlipidemia, unspecified: Secondary | ICD-10-CM | POA: Diagnosis not present

## 2018-08-10 DIAGNOSIS — L97529 Non-pressure chronic ulcer of other part of left foot with unspecified severity: Secondary | ICD-10-CM | POA: Insufficient documentation

## 2018-08-10 DIAGNOSIS — F17210 Nicotine dependence, cigarettes, uncomplicated: Secondary | ICD-10-CM | POA: Insufficient documentation

## 2018-08-10 DIAGNOSIS — I130 Hypertensive heart and chronic kidney disease with heart failure and stage 1 through stage 4 chronic kidney disease, or unspecified chronic kidney disease: Secondary | ICD-10-CM | POA: Diagnosis not present

## 2018-08-10 DIAGNOSIS — M79662 Pain in left lower leg: Secondary | ICD-10-CM | POA: Diagnosis not present

## 2018-08-10 DIAGNOSIS — Z7982 Long term (current) use of aspirin: Secondary | ICD-10-CM | POA: Diagnosis not present

## 2018-08-10 DIAGNOSIS — N183 Chronic kidney disease, stage 3 (moderate): Secondary | ICD-10-CM | POA: Diagnosis not present

## 2018-08-10 DIAGNOSIS — Z7984 Long term (current) use of oral hypoglycemic drugs: Secondary | ICD-10-CM | POA: Diagnosis not present

## 2018-08-10 DIAGNOSIS — I70245 Atherosclerosis of native arteries of left leg with ulceration of other part of foot: Secondary | ICD-10-CM | POA: Diagnosis present

## 2018-08-10 DIAGNOSIS — E1122 Type 2 diabetes mellitus with diabetic chronic kidney disease: Secondary | ICD-10-CM | POA: Insufficient documentation

## 2018-08-10 DIAGNOSIS — I70222 Atherosclerosis of native arteries of extremities with rest pain, left leg: Secondary | ICD-10-CM | POA: Diagnosis not present

## 2018-08-10 DIAGNOSIS — E11621 Type 2 diabetes mellitus with foot ulcer: Secondary | ICD-10-CM | POA: Insufficient documentation

## 2018-08-10 DIAGNOSIS — I70201 Unspecified atherosclerosis of native arteries of extremities, right leg: Secondary | ICD-10-CM | POA: Diagnosis not present

## 2018-08-10 DIAGNOSIS — E1151 Type 2 diabetes mellitus with diabetic peripheral angiopathy without gangrene: Secondary | ICD-10-CM | POA: Insufficient documentation

## 2018-08-10 DIAGNOSIS — L97909 Non-pressure chronic ulcer of unspecified part of unspecified lower leg with unspecified severity: Secondary | ICD-10-CM

## 2018-08-10 DIAGNOSIS — I70299 Other atherosclerosis of native arteries of extremities, unspecified extremity: Secondary | ICD-10-CM

## 2018-08-10 HISTORY — PX: LOWER EXTREMITY ANGIOGRAPHY: CATH118251

## 2018-08-10 LAB — BUN: BUN: 20 mg/dL (ref 8–23)

## 2018-08-10 LAB — CREATININE, SERUM
Creatinine, Ser: 1.14 mg/dL (ref 0.61–1.24)
GFR calc Af Amer: >60 mL/min (ref >60)
GFR calc non Af Amer: >60 mL/min (ref >60)

## 2018-08-10 LAB — GLUCOSE, CAPILLARY
Glucose-Capillary: 111 mg/dL — ABNORMAL HIGH (ref 70–99)
Glucose-Capillary: 99 mg/dL (ref 70–99)

## 2018-08-10 SURGERY — LOWER EXTREMITY ANGIOGRAPHY
Anesthesia: Moderate Sedation | Site: Leg Lower | Laterality: Left

## 2018-08-10 MED ORDER — LIDOCAINE-EPINEPHRINE (PF) 1 %-1:200000 IJ SOLN
INTRAMUSCULAR | Status: AC
  Filled 2018-08-10: qty 30

## 2018-08-10 MED ORDER — HYDRALAZINE HCL 20 MG/ML IJ SOLN: 5.0000 mg | INTRAMUSCULAR | Status: DC | PRN

## 2018-08-10 MED ORDER — DIPHENHYDRAMINE HCL 50 MG/ML IJ SOLN: 50.0000 mg | Freq: Once | INTRAMUSCULAR | Status: DC | PRN

## 2018-08-10 MED ORDER — SODIUM CHLORIDE 0.9 % IV SOLN
INTRAVENOUS | Status: DC
  Administered 2018-08-10: 10:00:00 via INTRAVENOUS

## 2018-08-10 MED ORDER — IOHEXOL 300 MG/ML  SOLN
INTRAMUSCULAR | Status: DC | PRN
  Administered 2018-08-10: 11:00:00 130 mL via INTRAVENOUS

## 2018-08-10 MED ORDER — ONDANSETRON HCL 4 MG/2ML IJ SOLN: 4.0000 mg | Freq: Four times a day (QID) | INTRAMUSCULAR | Status: DC | PRN

## 2018-08-10 MED ORDER — FENTANYL CITRATE (PF) 100 MCG/2ML IJ SOLN
INTRAMUSCULAR | Status: DC | PRN
  Administered 2018-08-10 (×2): 25 ug via INTRAVENOUS
  Administered 2018-08-10 (×2): 50 ug via INTRAVENOUS
  Administered 2018-08-10: 25 ug via INTRAVENOUS
  Administered 2018-08-10: 50 ug via INTRAVENOUS

## 2018-08-10 MED ORDER — MIDAZOLAM HCL 2 MG/2ML IJ SOLN
INTRAMUSCULAR | Status: DC | PRN
  Administered 2018-08-10: 1 mg via INTRAVENOUS
  Administered 2018-08-10: 2 mg via INTRAVENOUS
  Administered 2018-08-10 (×2): 1 mg via INTRAVENOUS

## 2018-08-10 MED ORDER — SODIUM CHLORIDE 0.9 % IV SOLN: INTRAVENOUS | Status: DC

## 2018-08-10 MED ORDER — FAMOTIDINE 20 MG PO TABS: 40.0000 mg | ORAL_TABLET | Freq: Once | ORAL | Status: DC | PRN

## 2018-08-10 MED ORDER — MIDAZOLAM HCL 2 MG/ML PO SYRP: 8.0000 mg | ORAL_SOLUTION | Freq: Once | ORAL | Status: DC | PRN

## 2018-08-10 MED ORDER — MIDAZOLAM HCL 5 MG/5ML IJ SOLN
INTRAMUSCULAR | Status: AC
  Filled 2018-08-10: qty 10

## 2018-08-10 MED ORDER — HEPARIN (PORCINE) IN NACL 1000-0.9 UT/500ML-% IV SOLN
INTRAVENOUS | Status: AC
  Filled 2018-08-10: qty 1000

## 2018-08-10 MED ORDER — SODIUM CHLORIDE 0.9% FLUSH: 3.0000 mL | INTRAVENOUS | Status: DC | PRN

## 2018-08-10 MED ORDER — ACETAMINOPHEN 325 MG PO TABS: 650.0000 mg | ORAL_TABLET | ORAL | Status: DC | PRN

## 2018-08-10 MED ORDER — FENTANYL CITRATE (PF) 100 MCG/2ML IJ SOLN
INTRAMUSCULAR | Status: AC
  Filled 2018-08-10: qty 4

## 2018-08-10 MED ORDER — HEPARIN SODIUM (PORCINE) 1000 UNIT/ML IJ SOLN
INTRAMUSCULAR | Status: DC | PRN
  Administered 2018-08-10: 5000 [IU] via INTRAVENOUS

## 2018-08-10 MED ORDER — LABETALOL HCL 5 MG/ML IV SOLN: 10.0000 mg | INTRAVENOUS | Status: DC | PRN

## 2018-08-10 MED ORDER — SODIUM CHLORIDE 0.9% FLUSH: 3.0000 mL | Freq: Two times a day (BID) | INTRAVENOUS | Status: DC

## 2018-08-10 MED ORDER — HEPARIN SODIUM (PORCINE) 1000 UNIT/ML IJ SOLN
INTRAMUSCULAR | Status: AC
  Filled 2018-08-10: qty 1

## 2018-08-10 MED ORDER — CEFAZOLIN SODIUM-DEXTROSE 2-4 GM/100ML-% IV SOLN
2.0000 g | Freq: Once | INTRAVENOUS | Status: AC
  Administered 2018-08-10: 2 g via INTRAVENOUS

## 2018-08-10 MED ORDER — METHYLPREDNISOLONE SODIUM SUCC 125 MG IJ SOLR: 125.0000 mg | Freq: Once | INTRAMUSCULAR | Status: DC | PRN

## 2018-08-10 MED ORDER — HYDROMORPHONE HCL 1 MG/ML IJ SOLN: 1.0000 mg | Freq: Once | INTRAMUSCULAR | Status: DC | PRN

## 2018-08-10 MED ORDER — SODIUM CHLORIDE 0.9 % IV SOLN: 250.0000 mL | INTRAVENOUS | Status: DC | PRN

## 2018-08-10 SURGICAL SUPPLY — 27 items
BALLN LUTONIX 5X220X130 (BALLOONS) ×6
BALLN ULTRVRSE 3X100X130C (BALLOONS) ×3
BALLN ULTRVRSE 8X40X75C (BALLOONS) ×3
BALLOON LUTONIX 5X220X130 (BALLOONS) ×2 IMPLANT
BALLOON ULTRVRSE 3X100X130C (BALLOONS) ×1 IMPLANT
BALLOON ULTRVRSE 8X40X75C (BALLOONS) ×1 IMPLANT
CATH BEACON 5 .038 100 VERT TP (CATHETERS) ×3 IMPLANT
CATH PIG 70CM (CATHETERS) ×3 IMPLANT
CATH SEEKER .035X135CM (CATHETERS) ×3 IMPLANT
COVER PROBE U/S 5X48 (MISCELLANEOUS) ×3 IMPLANT
DEVICE PRESTO INFLATION (MISCELLANEOUS) ×6 IMPLANT
DEVICE STARCLOSE SE CLOSURE (Vascular Products) ×6 IMPLANT
GLIDEWIRE ADV .035X180CM (WIRE) ×3 IMPLANT
GLIDEWIRE ADV .035X260CM (WIRE) ×3 IMPLANT
GUIDEWIRE ADV .018X180CM (WIRE) IMPLANT
PACK ANGIOGRAPHY (CUSTOM PROCEDURE TRAY) ×3 IMPLANT
SHEATH ANL2 6FRX45 HC (SHEATH) ×3 IMPLANT
SHEATH BRITE TIP 5FRX11 (SHEATH) ×3 IMPLANT
SHEATH BRITE TIP 6FRX11 (SHEATH) ×3 IMPLANT
SHEATH BRITE TIP 7FRX11 (SHEATH) ×3 IMPLANT
STENT LIFESTREAM 7X26X80 (Permanent Stent) ×3 IMPLANT
STENT LIFESTREAM 8X37X80 (Permanent Stent) ×3 IMPLANT
STENT VIABAHN 6X250X120 (Permanent Stent) ×3 IMPLANT
SYR MEDRAD MARK 7 150ML (SYRINGE) ×3 IMPLANT
TUBING CONTRAST HIGH PRESS 72 (TUBING) ×3 IMPLANT
WIRE G V18X300CM (WIRE) ×3 IMPLANT
WIRE J 3MM .035X145CM (WIRE) ×3 IMPLANT

## 2018-08-10 NOTE — Discharge Instructions (Signed)
Moderate Conscious Sedation, Adult, Care After °These instructions provide you with information about caring for yourself after your procedure. Your health care provider may also give you more specific instructions. Your treatment has been planned according to current medical practices, but problems sometimes occur. Call your health care provider if you have any problems or questions after your procedure. °What can I expect after the procedure? °After your procedure, it is common: °· To feel sleepy for several hours. °· To feel clumsy and have poor balance for several hours. °· To have poor judgment for several hours. °· To vomit if you eat too soon. °Follow these instructions at home: °For at least 24 hours after the procedure: ° °· Do not: °? Participate in activities where you could fall or become injured. °? Drive. °? Use heavy machinery. °? Drink alcohol. °? Take sleeping pills or medicines that cause drowsiness. °? Make important decisions or sign legal documents. °? Take care of children on your own. °· Rest. °Eating and drinking °· Follow the diet recommended by your health care provider. °· If you vomit: °? Drink water, juice, or soup when you can drink without vomiting. °? Make sure you have little or no nausea before eating solid foods. °General instructions °· Have a responsible adult stay with you until you are awake and alert. °· Take over-the-counter and prescription medicines only as told by your health care provider. °· If you smoke, do not smoke without supervision. °· Keep all follow-up visits as told by your health care provider. This is important. °Contact a health care provider if: °· You keep feeling nauseous or you keep vomiting. °· You feel light-headed. °· You develop a rash. °· You have a fever. °Get help right away if: °· You have trouble breathing. °This information is not intended to replace advice given to you by your health care provider. Make sure you discuss any questions you have  with your health care provider. °Document Released: 02/03/2013 Document Revised: 09/18/2015 Document Reviewed: 08/05/2015 °Elsevier Interactive Patient Education © 2019 Else[employer] Inc[employer] °Angiogram, Care After °This sheet gives you information about how to care for yourself after your procedure. Your doctor may also give you more specific instructions. If you have problems or questions, contact your doctor. °Follow these instructions at home: °Insertion site care °· Follow instructions from your doctor about how to take care of your long, thin tube (catheter) insertion area. Make sure you: °? Wash your hands with soap and water before you change your bandage (dressing). If you cannot use soap and water, use hand sanitizer. °? Change your bandage as told by your doctor. °? Leave stitches (sutures), skin glue, or skin tape (adhesive) strips in place. They may need to stay in place for 2 weeks or longer. If tape strips get loose and curl up, you may trim the loose edges. Do not remove tape strips completely unless your doctor says it is okay. °· Do not take baths, swim, or use a hot tub until your doctor says it is okay. °· You may shower 24-48 hours after the procedure or as told by your doctor. °? Gently wash the area with plain soap and water. °? Pat the area dry with a clean towel. °? Do not rub the area. This may cause bleeding. °· Do not apply powder or lotion to the area. Keep the area clean and dry. °· Check your insertion area every day for signs of infection. Check for: °? More redness, swelling, or pain. °?   Fluid or blood. °? Warmth. °? Pus or a bad smell. °Activity °· Rest as told by your doctor, usually for 1-2 days. °· Do not lift anything that is heavier than 10 lbs. (4.5 kg) or as told by your doctor. °· Do not drive for 24 hours if you were given a medicine to help you relax (sedative). °· Do not drive or use heavy machinery while taking prescription pain medicine. °General instructions ° °· Go back to  your normal activities as told by your doctor, usually in about a week. Ask your doctor what activities are safe for you. °· If the insertion area starts to bleed, lie flat and put pressure on the area. If the bleeding does not stop, get help right away. This is an emergency. °· Drink enough fluid to keep your pee (urine) clear or pale yellow. °· Take over-the-counter and prescription medicines only as told by your doctor. °· Keep all follow-up visits as told by your doctor. This is important. °Contact a doctor if: °· You have a fever. °· You have chills. °· You have more redness, swelling, or pain around your insertion area. °· You have fluid or blood coming from your insertion area. °· The insertion area feels warm to the touch. °· You have pus or a bad smell coming from your insertion area. °· You have more bruising around the insertion area. °· Blood collects in the tissue around the insertion area (hematoma) that may be painful to the touch. °Get help right away if: °· You have a lot of pain in the insertion area. °· The insertion area swells very fast. °· The insertion area is bleeding, and the bleeding does not stop after holding steady pressure on the area. °· The area near or just beyond the insertion area becomes pale, cool, tingly, or numb. °These symptoms may be an emergency. Do not wait to see if the symptoms will go away. Get medical help right away. Call your local emergency services (911 in the [place] Do not drive yourself to the hospital. °Summary °· After the procedure, it is common to have bruising and tenderness at the long, thin tube insertion area. °· After the procedure, it is important to rest and drink plenty of fluids. °· Do not take baths, swim, or use a hot tub until your doctor says it is okay to do so. You may shower 24-48 hours after the procedure or as told by your doctor. °· If the insertion area starts to bleed, lie flat and put pressure on the area. If the bleeding does not stop,  get help right away. This is an emergency. °This information is not intended to replace advice given to you by your health care provider. Make sure you discuss any questions you have with your health care provider. °Document Released: 07/12/2008 Document Revised: 04/09/2016 Document Reviewed: 04/09/2016 °Elsevier Interactive Patient Education © 2019 Elsevier Inc. ° °

## 2018-08-10 NOTE — H&P (Signed)
 [facility] History & Physical Update  The patient was interviewed and re-examined.  The patient's previous History and Physical has been reviewed and is unchanged.  There is no change in the plan of care. We plan to proceed with the scheduled procedure.  Leotis Pain, MD  08/10/2018, 9:21 AM

## 2018-08-10 NOTE — Op Note (Signed)
[facility]  Percutaneous Study/Intervention Procedural Note   Date of Surgery: 08/10/2018  Surgeon(s):Kacie Huxtable    Assistants:none  Pre-operative Diagnosis: PAD with rest pain LLE  Post-operative diagnosis:  Same  Procedure(s) Performed:             1.  Ultrasound guidance for vascular access right femoral artery and left femoral artery             2.  Catheter placement into left common femoral artery from right femoral approach and into the aorta from left femoral approach             3.  Aortogram and selective left lower extremity angiogram             4.  Percutaneous transluminal angioplasty of left anterior tibial artery with 3 mm diameter by 10 cm length angioplasty balloon             5.   Left superficial femoral artery and above-knee popliteal artery with two 5 mm diameter by 22 cm length Lutonix drug-coated angioplasty balloons  6.  Viabahn stent placement to the mid and distal SFA and proximal popliteal artery with a 6 mm diameter by 25 cm length stent for multiple areas of greater than 50% stenosis after angioplasty  7.  Kissing balloon stent placement to both common iliac arteries and the terminal aorta with an 8 mm diameter by 37 mm length lifestream stent on the right and a 7 mm diameter by 26 mm length lifestream stent on the left             .  StarClose closure device bilateral femoral arteries  EBL: 10 cc  Contrast: 130 cc  Fluoro Time: 12.8 minutes  Moderate Conscious Sedation Time: approximately 60 minutes using 5 mg of Versed and 125 mcg of Fentanyl              Indications:  Patient is a 68 y.o.male with worsening symptoms on the left leg now with rest pain. The patient has noninvasive study showing significantly reduced left ABI.  He has previously undergone treatment on the right leg for disabling claudication symptoms. The patient is brought in for angiography for further evaluation and potential treatment.  Due to the limb  threatening nature of the situation, angiogram was performed for attempted limb salvage. The patient is aware that if the procedure fails, amputation would be expected.  The patient also understands that even with successful revascularization, amputation may still be required due to the severity of the situation.  Risks and benefits are discussed and informed consent is obtained.   Procedure:  The patient was identified and appropriate procedural time out was performed.  The patient was then placed supine on the table and prepped and draped in the usual sterile fashion. Moderate conscious sedation was administered during a face to face encounter with the patient throughout the procedure with my supervision of the RN administering medicines and monitoring the patient's vital signs, pulse oximetry, telemetry and mental status throughout from the start of the procedure until the patient was taken to the recovery room. Ultrasound was used to evaluate the right common femoral artery.  It was patent .  A digital ultrasound image was acquired.  A Seldinger needle was used to access the right common femoral artery under direct ultrasound guidance and a permanent image was performed.  A 0.035 J wire was advanced without resistance and a 5Fr sheath was placed.  Pigtail catheter was  placed into the aorta and an AP aortogram was performed. This demonstrated that the renal arteries did not show any hemodynamically significant stenosis on either side.  The aorta was calcific and at the terminal aorta there was plaque that went into both common iliac arteries creating a moderate stenosis on the right in the 55 to 60% range and a more significant stenosis on the left in the 70 to 75% range.  The left hypogastric artery was occluded. The right hypogastric artery was patent.  Both external iliac arteries were patent. I then crossed the aortic bifurcation and advanced to the left femoral head. Selective left lower extremity  angiogram was then performed. This demonstrated an area in the proximal SFA with about a 60% stenosis, the mid SFA and then had a medium length occlusion with reconstitution at Hunter's canal.  There was then a short segment of stenosis with near occlusion in the above-knee popliteal artery.  There was then a typical tibial trifurcation with moderate to high-grade stenosis in the proximal portion of the anterior tibial artery in the 70 to 80% range over the first 5 to 7 cm.  The vessel then normalized and was continuous to the foot.  The posterior tibial artery was also continuous to the foot and the peroneal artery provided a small third vessel for runoff without focal stenosis obvious.  It was felt that it was in the patient's best interest to proceed with intervention after these images to avoid a second procedure and a larger amount of contrast and fluoroscopy based off of the findings from the initial angiogram.  It was also felt that treating his infrainguinal disease on the left before rebuilding the aortic bifurcation would be appropriate as we may or may not be able to cross the aortic bifurcation after the kissing stents that would be necessary would be placed.  The patient was systemically heparinized and a 6 [place] Ansell sheath was then placed over the [employer] wire. I then used a Kumpe catheter and the advantage wire to navigate through the multiple areas of SFA and popliteal stenosis and occlusion.  I then exchanged down with a Kumpe catheter for a 0.018 wire and cross the stenosis in the anterior tibial artery as well.  A 3 mm diameter by 10 cm length angioplasty balloon was then inflated to 8 atm for 1 minute in the anterior tibial artery proximally.  Following angioplasty there was about a 20 to 30% residual stenosis in the anterior tibial artery.  We then used a pair of 5 mm diameter by 22 cm length Lutonix drug-coated angioplasty balloons to treat from the mid popliteal artery up to the  proximal SFA and encompass the multiple lesions.  These balloons were both inflated to 8 atm for 1 minute.  Completion imaging showed the proximal SFA to have only about a 20% residual stenosis but the mid SFA had about a 70 to 80% residual stenosis, Hunter's canal had about a 50% stenosis, and the above-knee popliteal artery had about a 70 to 80% stenosis.  I was able to encompass all 3 lesions with a 6 mm diameter by 25 cm length Viabahn stent deployed from the mid SFA to the above-knee popliteal artery and postdilated with a 5 mm balloon with excellent angiographic completion result and less than 10% residual stenosis.  I then turned my attention to treating the aortoiliac disease.  The 0.035 advantage wire was replaced through the right femoral sheath and we exchanged for a 7 [place]  sheath in the right groin.  Ultrasound was used to visualize the left femoral artery in an area where it was patent.  It was then accessed under direct ultrasound guidance without difficulty with a Seldinger needle and a permanent image was recorded.  A 6 French sheath was then placed in the left femoral artery and a 0.035 advantage wire was used to cross the stenosis in the left iliac system and get up into the aorta.  I then selected an 8 mm diameter by 37 mm length lifestream stent for the right iliac system and a 7 mm diameter by 26 mm length lifestream stent for the left iliac system.  These were advanced into the distal aorta a few millimeters to make sure we encompassed all the lesions and gained a good seal.  These balloons were then inflated to 12 atm and then I upsized to an 8 mm balloon to repeat treatment on that side and ensure apposition.  We had stuck with a 7 mm extreme stent because it would clearly seal and would allow [place] to stay with a 6 French sheath on the left.  After inflating 8 mm balloons to 12 atm bilaterally, completion imaging was performed.  This demonstrated less than 10% residual stenosis in the distal  aorta and bilateral common iliac arteries with brisk flow distally.  I elected to terminate the procedure. The sheath was removed and StarClose closure device was deployed in the right femoral artery with excellent hemostatic result.  Similarly, the left femoral sheath was removed and a Star close closure device was deployed on the left with excellent hemostatic result.  The patient was taken to the recovery room in stable condition having tolerated the procedure well.  Findings:               Aortogram:  Renal arteries did not show any hemodynamically significant stenosis on either side.  The aorta was calcific and at the terminal aorta there was plaque that went into both common iliac arteries creating a moderate stenosis on the right in the 55 to 60% range and a more significant stenosis on the left in the 70 to 75% range.  The left hypogastric artery was occluded. The right hypogastric artery was patent.  Both external iliac arteries were patent.             Left Lower Extremity:  This demonstrated an area in the proximal SFA with about a 60% stenosis, the mid SFA and then had a medium length occlusion with reconstitution at Hunter's canal.  There was then a short segment of stenosis with near occlusion in the above-knee popliteal artery.  There was then a typical tibial trifurcation with moderate to high-grade stenosis in the proximal portion of the anterior tibial artery in the 70 to 80% range over the first 5 to 7 cm.  The vessel then normalized and was continuous to the foot.  The posterior tibial artery was also continuous to the foot and the peroneal artery provided a small third vessel for runoff without focal stenosis obvious   Disposition: Patient was taken to the recovery room in stable condition having tolerated the procedure well.  Complications: None  Leotis Pain 08/10/2018 11:16 AM   This note was created with Dragon Medical transcription system. Any errors in dictation are purely  unintentional.

## 2018-08-11 ENCOUNTER — Encounter: Payer: Self-pay | Admitting: Vascular Surgery

## 2018-08-17 MED FILL — IMBRUVICA 280 MG TAB: 280 | 28 days supply | Qty: 28 | Fill #3

## 2018-08-19 ENCOUNTER — Other Ambulatory Visit: Payer: Self-pay

## 2018-08-20 ENCOUNTER — Inpatient Hospital Stay (HOSPITAL_BASED_OUTPATIENT_CLINIC_OR_DEPARTMENT_OTHER): Payer: Medicare Other | Admitting: Internal Medicine

## 2018-08-20 ENCOUNTER — Encounter: Payer: Self-pay | Admitting: Internal Medicine

## 2018-08-20 ENCOUNTER — Inpatient Hospital Stay: Payer: Medicare Other

## 2018-08-20 ENCOUNTER — Other Ambulatory Visit: Payer: Self-pay

## 2018-08-20 VITALS — BP 128/61 | HR 50 | Temp 96.6°F | Resp 20

## 2018-08-20 DIAGNOSIS — N183 Chronic kidney disease, stage 3 unspecified: Secondary | ICD-10-CM

## 2018-08-20 DIAGNOSIS — D631 Anemia in chronic kidney disease: Secondary | ICD-10-CM

## 2018-08-20 DIAGNOSIS — N189 Chronic kidney disease, unspecified: Secondary | ICD-10-CM | POA: Diagnosis not present

## 2018-08-20 DIAGNOSIS — C9192 Lymphoid leukemia, unspecified, in relapse: Secondary | ICD-10-CM | POA: Diagnosis not present

## 2018-08-20 DIAGNOSIS — C9112 Chronic lymphocytic leukemia of B-cell type in relapse: Secondary | ICD-10-CM

## 2018-08-20 LAB — COMPREHENSIVE METABOLIC PANEL
ALT: 9 U/L (ref 0–44)
AST: 12 U/L — ABNORMAL LOW (ref 15–41)
Albumin: 3.7 g/dL (ref 3.5–5.0)
Alkaline Phosphatase: 83 U/L (ref 38–126)
Anion gap: 6 (ref 5–15)
BUN: 22 mg/dL (ref 8–23)
CO2: 25 mmol/L (ref 22–32)
Calcium: 8.5 mg/dL — ABNORMAL LOW (ref 8.9–10.3)
Chloride: 109 mmol/L (ref 98–111)
Creatinine, Ser: 1.57 mg/dL — ABNORMAL HIGH (ref 0.61–1.24)
GFR calc Af Amer: 51 mL/min — ABNORMAL LOW (ref >60)
GFR calc non Af Amer: 44 mL/min — ABNORMAL LOW (ref >60)
Glucose, Bld: 118 mg/dL — ABNORMAL HIGH (ref 70–99)
Potassium: 5.3 mmol/L — ABNORMAL HIGH (ref 3.5–5.1)
Sodium: 140 mmol/L (ref 135–145)
Total Bilirubin: 0.4 mg/dL (ref 0.3–1.2)
Total Protein: 6.9 g/dL (ref 6.5–8.1)

## 2018-08-20 LAB — HEMOGLOBIN: Hemoglobin: 9 g/dL — ABNORMAL LOW (ref 13.0–17.0)

## 2018-08-20 LAB — HEMATOCRIT: HCT: 29.6 % — ABNORMAL LOW (ref 39.0–52.0)

## 2018-08-20 MED ORDER — DARBEPOETIN ALFA 300 MCG/0.6ML IJ SOSY
300.0000 ug | PREFILLED_SYRINGE | Freq: Once | INTRAMUSCULAR | Status: AC
  Administered 2018-08-20: 10:00:00 300 ug via SUBCUTANEOUS
  Filled 2018-08-20: qty 0.6

## 2018-08-20 NOTE — Assessment & Plan Note (Addendum)
#   CLL/SLL- relapsed currently off ibrutinib [sec ton intolerance in sep 2019]; SEP 24th 2019-CT scan shows no progressive/worsening lymphadenopathy.  Ibrutinib 280 mg once a day.  Stable.    #Leukocytosis - 26,000/ lymphocytosis-likely spurious secondary to ibrutinib.  #Anemia hemoglobin ~9. CLL/CKD; stable. continue to 300 mcg q 2 weeks.   # Abdominal pain/dyspepsia/unclear etiology; stable. Continue Percocet for now.  Awaiting appt at [place] in June 2020.   # DISPOSITION:  # next week- cbc/possible aranesp.   # in 4 weeks- MD-cbc/bmp/possible aranesp-Dr.B

## 2018-08-20 NOTE — Progress Notes (Signed)
I connected with James [name] on 08/20/2018 at 11:30 AM EDT by telephone visit and verified that I am speaking with the correct person using two identifiers.  I discussed the limitations, risks, security and privacy concerns of performing an evaluation and management service by telemedicine and the availability of in-person appointments. I also discussed with the patient that there may be a patient responsible charge related to this service. The patient expressed understanding and agreed to proceed.    Other persons participating in the visit and their role in the encounter: none   Patient's location: home  Provider's location: home   Oncology History Overview Note  # 2006- CLL STAGE IV; MAY 2011- WBC- 57K;Platelets-99;Hb-12/CT Bulky LN; BMBx- 80% Invol; del 11; START Benda-Ritux x4 [finished Sep 2011];   # July 2015-Progression; Sep 2015-START ibrutinib; CT scan DEC 2015- Improvement LN; Cont Ibrutinib 154m/d; NOV 2016 CT- 1-2CM LN [mild progression compared to Dec 2015];NOV 2016- FISH peripheral blood- NO MUTATIONS/CD-38 Positive; NOV 7th- CONT IBRUTINIB 2 pills/day; CT AUG 2017- STABLE;  DEC 6th PET- Mild RP LN/ Retrocrural LN  # OFF ibrutinib [? intol]- sep 2019- Jan 16th 2020; Re-start Ibrutinib   # Jan 16th 2020- start aranesp  # DEC 2019- PAIN CONTRACT  #October 2019-bone marrow biopsy [worsening anemia]-question dyserythropoietic changes/small clone of CLL;  # [facility]- NEG.  SA skin infection [Oct 2016] s/p clinda -------------------------------------------------------    DIAGNOSIS: CLL  STAGE:   IV      ;GOALS: palliative  CURRENT/MOST RECENT THERAPY : Ibrutinib.    CLL (chronic lymphoid leukemia) in relapse ([facility]     Chief Complaint: CLL/ Anemia sec to CKD.     History of present illness:James [name] 68y.o.  male with history of CLL; chronic kidney disease on Aranesp; history of chronic abdominal pain of unclear etiology is here for  follow-up.  Patient continues to complain of fatigue.  Denies any nausea vomiting.   Observation/objective:  Assessment and plan: CLL (chronic lymphoid leukemia) in relapse ([facility] # CLL/SLL- relapsed currently off ibrutinib [sec ton intolerance in sep 2019]; SEP 24th 2019-CT scan shows no progressive/worsening lymphadenopathy.  Ibrutinib 280 mg once a day.  Stable.    #Leukocytosis - 26,000/ lymphocytosis-likely spurious secondary to ibrutinib.  #Anemia hemoglobin ~9. CLL/CKD; stable. continue to 300 mcg q 2 weeks.   # Abdominal pain/dyspepsia/unclear etiology; stable. Continue Percocet for now.  [place]   # DISPOSITION:  # next week- cbc/possible aranesp.   # in 4 weeks- MD-cbc/bmp/possible aranesp-Dr.B    Follow-up instructions:  I discussed the assessment and treatment plan with the patient.  The patient was provided an opportunity to ask questions and all were answered.  The patient agreed with the plan and demonstrated understanding of instructions.  The patient was advised to call back or seek an in person evaluation if the symptoms worsen or if the condition fails to improve as anticipated.  I provided12  minutes of non face-to-face telephone visit time during this encounter, and > 50% was spent counseling as documented under my assessment & plan.   Dr. GCharlaine DaltonCOtoeat [facility]6/29/2020 8:33 PM

## 2018-09-04 ENCOUNTER — Other Ambulatory Visit: Payer: Self-pay | Admitting: Internal Medicine

## 2018-09-04 ENCOUNTER — Other Ambulatory Visit: Payer: Self-pay | Admitting: *Deleted

## 2018-09-04 DIAGNOSIS — C9192 Lymphoid leukemia, unspecified, in relapse: Secondary | ICD-10-CM

## 2018-09-04 DIAGNOSIS — C8300 Small cell B-cell lymphoma, unspecified site: Secondary | ICD-10-CM

## 2018-09-04 DIAGNOSIS — C9112 Chronic lymphocytic leukemia of B-cell type in relapse: Secondary | ICD-10-CM

## 2018-09-04 NOTE — Telephone Encounter (Signed)
Incoming call from patient. msg left on triage. Pt requesting narcotic renewal.

## 2018-09-04 NOTE — Telephone Encounter (Signed)
He had a script filled by his PCP on 08/13/18 and by Dr. Rogue Bussing on 08/06/18 both for Percocet. One for 90 tabs and one for 30 tabs. I wonder how often he is taking it?

## 2018-09-07 NOTE — Telephone Encounter (Signed)
Thanks for the update [name]!

## 2018-09-07 NOTE — Telephone Encounter (Signed)
Patient arrived in the clinic at 1030 this am requesting update on his narcotic rf. Patient also has no further apts in the cancer center. Chart reviewed with Dr. Rogue Bussing. Per Sonia Baller, NP's note, patient also had narcotic RFs provided by his pcp. Narcotics will not be filled at this time due to pain contract.  Dr. Rogue Bussing will review his chart and personally contact the patient via phone call this afternoon to further discuss the violation of the pain contract as well as any further f/u.  Doni, RN informed the patient of the plan of care.

## 2018-09-08 ENCOUNTER — Telehealth: Payer: Self-pay | Admitting: Internal Medicine

## 2018-09-08 NOTE — Telephone Encounter (Signed)
Left a message for the patient on his cell phone-call us back to discuss narcotic prescription refills.

## 2018-09-09 ENCOUNTER — Telehealth: Payer: Self-pay | Admitting: Internal Medicine

## 2018-09-09 DIAGNOSIS — C9192 Lymphoid leukemia, unspecified, in relapse: Secondary | ICD-10-CM

## 2018-09-09 DIAGNOSIS — C9112 Chronic lymphocytic leukemia of B-cell type in relapse: Secondary | ICD-10-CM

## 2018-09-09 DIAGNOSIS — C8300 Small cell B-cell lymphoma, unspecified site: Secondary | ICD-10-CM

## 2018-09-09 MED ORDER — OXYCODONE-ACETAMINOPHEN 5-325 MG PO TABS: ORAL_TABLET | ORAL | 0 refills | Status: DC

## 2018-09-09 NOTE — Telephone Encounter (Signed)
I called the patient regarding his narcotic prescriptions again today-reviewed his filling of narcotic prescriptions.   Patient had received 90 pills from [place] 4/09; and then on 4/16 1 week later he filled a prescription from [name] for 30 pills. Reviewed with the patient that this is not acceptable; and breech of pain contract.  Patient states he is sorry/and will not repeat again.   I informed patient I will not change the frequency/or directions at this time.  He will have to call his pharmacy/and find out the earliest he can fill the prescription. Script was sent to [facility] 5/13 AM.

## 2018-09-09 NOTE — Telephone Encounter (Signed)
Spoke to patient regarding his narcotic refill.  Initially patient denied having received a refill from his PCP for narcotics.  However, after confirming with the patient that his narcotic prescription refill show that he had refilled a prescription PCP-patient admits that he is " sorry"; and he would not repeat it again.  Discussed with patient that he is under pain contract-and this is the second time he has received pain prescription from his PCP-when he is aware that he should not be getting narcotics from any other provider.  Patient was told that he would not be prescribed any further narcotics if there is a breach of pain contract in the future.  I agreed to refill patient's pain prescription on above conditions; with understanding that he will not receive any further narcotics if there is breech of the pain contract.

## 2018-09-10 MED FILL — IMBRUVICA 280 MG TAB: 280 | 28 days supply | Qty: 28 | Fill #4

## 2018-09-17 ENCOUNTER — Other Ambulatory Visit: Payer: Self-pay | Admitting: *Deleted

## 2018-09-17 DIAGNOSIS — C8300 Small cell B-cell lymphoma, unspecified site: Secondary | ICD-10-CM

## 2018-09-24 ENCOUNTER — Inpatient Hospital Stay: Payer: Medicare Other

## 2018-09-24 ENCOUNTER — Other Ambulatory Visit: Payer: Self-pay

## 2018-09-24 ENCOUNTER — Inpatient Hospital Stay: Payer: Medicare Other | Attending: Internal Medicine

## 2018-09-24 VITALS — BP 113/70 | HR 55

## 2018-09-24 DIAGNOSIS — D649 Anemia, unspecified: Secondary | ICD-10-CM | POA: Diagnosis present

## 2018-09-24 DIAGNOSIS — D631 Anemia in chronic kidney disease: Secondary | ICD-10-CM | POA: Diagnosis present

## 2018-09-24 DIAGNOSIS — C8300 Small cell B-cell lymphoma, unspecified site: Secondary | ICD-10-CM

## 2018-09-24 DIAGNOSIS — N183 Chronic kidney disease, stage 3 unspecified: Secondary | ICD-10-CM

## 2018-09-24 DIAGNOSIS — N189 Chronic kidney disease, unspecified: Secondary | ICD-10-CM | POA: Insufficient documentation

## 2018-09-24 LAB — CBC WITH DIFFERENTIAL/PLATELET
Abs Immature Granulocytes: 0.04 10*3/uL (ref 0.00–0.07)
Basophils Absolute: 0.1 10*3/uL (ref 0.0–0.1)
Basophils Relative: 1 %
Eosinophils Absolute: 0 10*3/uL (ref 0.0–0.5)
Eosinophils Relative: 0 %
HCT: 28.4 % — ABNORMAL LOW (ref 39.0–52.0)
Hemoglobin: 8.8 g/dL — ABNORMAL LOW (ref 13.0–17.0)
Immature Granulocytes: 0 %
Lymphocytes Relative: 60 %
Lymphs Abs: 6.1 10*3/uL — ABNORMAL HIGH (ref 0.7–4.0)
MCH: 32.6 pg (ref 26.0–34.0)
MCHC: 31 g/dL (ref 30.0–36.0)
MCV: 105.2 fL — ABNORMAL HIGH (ref 80.0–100.0)
Monocytes Absolute: 0.6 10*3/uL (ref 0.1–1.0)
Monocytes Relative: 6 %
Neutro Abs: 3.3 10*3/uL (ref 1.7–7.7)
Neutrophils Relative %: 33 %
Platelets: 87 10*3/uL — ABNORMAL LOW (ref 150–400)
RBC: 2.7 MIL/uL — ABNORMAL LOW (ref 4.22–5.81)
RDW: 16.6 % — ABNORMAL HIGH (ref 11.5–15.5)
WBC: 10.2 10*3/uL (ref 4.0–10.5)
nRBC: 0 % (ref 0.0–0.2)

## 2018-09-24 MED ORDER — DARBEPOETIN ALFA 300 MCG/0.6ML IJ SOSY
300.0000 ug | PREFILLED_SYRINGE | Freq: Once | INTRAMUSCULAR | Status: AC
  Administered 2018-09-24: 300 ug via SUBCUTANEOUS
  Filled 2018-09-24: qty 0.6

## 2018-09-25 ENCOUNTER — Ambulatory Visit (INDEPENDENT_AMBULATORY_CARE_PROVIDER_SITE_OTHER): Payer: Medicare Other

## 2018-09-25 ENCOUNTER — Ambulatory Visit (INDEPENDENT_AMBULATORY_CARE_PROVIDER_SITE_OTHER): Payer: Medicare Other | Admitting: Vascular Surgery

## 2018-09-25 ENCOUNTER — Encounter (INDEPENDENT_AMBULATORY_CARE_PROVIDER_SITE_OTHER): Payer: Self-pay | Admitting: Vascular Surgery

## 2018-09-25 VITALS — BP 151/75 | HR 60 | Resp 16 | Ht 68.0 in | Wt 163.0 lb

## 2018-09-25 DIAGNOSIS — Z95820 Peripheral vascular angioplasty status with implants and grafts: Secondary | ICD-10-CM

## 2018-09-25 DIAGNOSIS — E118 Type 2 diabetes mellitus with unspecified complications: Secondary | ICD-10-CM | POA: Diagnosis not present

## 2018-09-25 DIAGNOSIS — I70213 Atherosclerosis of native arteries of extremities with intermittent claudication, bilateral legs: Secondary | ICD-10-CM

## 2018-09-25 DIAGNOSIS — Z79899 Other long term (current) drug therapy: Secondary | ICD-10-CM

## 2018-09-25 DIAGNOSIS — K3189 Other diseases of stomach and duodenum: Secondary | ICD-10-CM | POA: Insufficient documentation

## 2018-09-25 DIAGNOSIS — Z7984 Long term (current) use of oral hypoglycemic drugs: Secondary | ICD-10-CM

## 2018-09-25 DIAGNOSIS — F17210 Nicotine dependence, cigarettes, uncomplicated: Secondary | ICD-10-CM

## 2018-09-25 DIAGNOSIS — I10 Essential (primary) hypertension: Secondary | ICD-10-CM

## 2018-09-25 DIAGNOSIS — E785 Hyperlipidemia, unspecified: Secondary | ICD-10-CM | POA: Diagnosis not present

## 2018-09-25 DIAGNOSIS — I70219 Atherosclerosis of native arteries of extremities with intermittent claudication, unspecified extremity: Secondary | ICD-10-CM

## 2018-09-25 NOTE — Progress Notes (Signed)
MRN : [id number]  James [name] is a 68 y.o. ([birth year]) male who presents with chief complaint of  Chief Complaint  Patient presents with  . Follow-up    [facility] 6week ABI  .  History of Present Illness: Patient returns today in follow up of his PAD.  A little over a month ago, he underwent bilateral iliac intervention and extensive left lower extremity vascularization.  His claudication symptoms were markedly improved almost immediately after the procedure.  He had some reperfusion swelling which resolved within a week or so.  His access sites are well-healed. Noninvasive studies today show normal perfusion bilaterally with an ABI on the right of 0.99 and ABI on the left of 1.0 with good waveforms.  This is a marked improvement particularly on the left after intervention.  Current Outpatient Medications  Medication Sig Dispense Refill  . amLODipine (NORVASC) 10 MG tablet Take 1 tablet by mouth once daily 30 tablet 0  . aspirin EC 81 MG tablet Take 81 mg by mouth daily.    Marland Kitchen atorvastatin (LIPITOR) 10 MG tablet Take 1 tablet (10 mg total) by mouth daily. 30 tablet 11  . clopidogrel (PLAVIX) 75 MG tablet Take 1 tablet by mouth once daily 90 tablet 0  . IMBRUVICA 280 MG TABS TAKE 1 TABLET BY MOUTH DAILY. 28 tablet 6  . lisinopril (PRINIVIL,ZESTRIL) 30 MG tablet Take 30 mg by mouth daily.  1  . metFORMIN (GLUCOPHAGE) 1000 MG tablet Take 1,000 mg by mouth 2 (two) times daily.    . metoprolol tartrate (LOPRESSOR) 25 MG tablet Take 1 tablet by mouth twice daily 180 tablet 0  . omeprazole (PRILOSEC) 40 MG capsule Take 40 mg by mouth daily.     Marland Kitchen oxyCODONE-acetaminophen (PERCOCET/ROXICET) 5-325 MG tablet 1 pill every 8-12 hours 90 tablet 0  . sodium polystyrene (SPS) 15 GM/60ML suspension TAKE AS DIRECTED (30 GRAMS) FOR 1 DOSE    . iron polysaccharides (NU-IRON) 150 MG capsule Take 150 mg by mouth daily.      No current facility-administered medications for this visit.     Past Medical  History:  Diagnosis Date  . CLL (chronic lymphocytic leukemia) ([place])   . Diabetes mellitus without complication ([place])   . Hematuria   . Hypertension     Past Surgical History:  Procedure Laterality Date  . CATARACT EXTRACTION W/ INTRAOCULAR LENS IMPLANT Bilateral   . CHOLECYSTECTOMY  1983  . COLONOSCOPY WITH PROPOFOL N/A 10/27/2017   Procedure: COLONOSCOPY WITH PROPOFOL;  Surgeon: Manya Silvas, MD;  Location[facility] ENDOSCOPY;  Service: Endoscopy;  Laterality: N/A;  . ESOPHAGOGASTRODUODENOSCOPY (EGD) WITH PROPOFOL N/A 11/20/2016   Procedure: ESOPHAGOGASTRODUODENOSCOPY (EGD) WITH PROPOFOL;  Surgeon: Manya Silvas, MD;  Location: [facility] ENDOSCOPY;  Service: Endoscopy;  Laterality: N/A;  . ESOPHAGOGASTRODUODENOSCOPY (EGD) WITH PROPOFOL N/A 10/27/2017   Procedure: ESOPHAGOGASTRODUODENOSCOPY (EGD) WITH PROPOFOL;  Surgeon: Manya Silvas, MD;  Location: [facility] ENDOSCOPY;  Service: Endoscopy;  Laterality: N/A;  . LOWER EXTREMITY ANGIOGRAPHY Right 05/19/2017   Procedure: LOWER EXTREMITY ANGIOGRAPHY;  Surgeon: Algernon Huxley, MD;  Location: [place] CV LAB;  Service: Cardiovascular;  Laterality: Right;  . LOWER EXTREMITY ANGIOGRAPHY Left 08/10/2018   Procedure: LOWER EXTREMITY ANGIOGRAPHY;  Surgeon: Algernon Huxley, MD;  Location: [place] CV LAB;  Service: Cardiovascular;  Laterality: Left;   Social History        Tobacco Use  . Smoking status: Current Every Day Smoker    Packs/day: 1.00    Years:  47.00    Pack years: 47.00    Types: Cigarettes  . Smokeless tobacco: Never Used  Substance Use Topics  . Alcohol use: Yes    Alcohol/week: 3.0 oz    Types: 5 Glasses of wine per week    Comment: 5 a week  . Drug use: No    Family History      Family History  Problem Relation Age of Onset  . Hypertension Sister   No bleeding disorders, clotting disorders, or aneurysms  No Known Allergies   REVIEW OF SYSTEMS(Negative unless checked)   Constitutional: [] ???Weight loss[] ???Fever[] ???Chills Cardiac:[] ???Chest pain[] ???Chest pressure[] ???Palpitations [] ???Shortness of breath when laying flat [] ???Shortness of breath at rest [] ???Shortness of breath with exertion. Vascular: [x] ???Pain in legs with walking[] ???Pain in legsat rest[] ???Pain in legs when laying flat [x] ???Claudication [] ???Pain in feet when walking [] ???Pain in feet at rest [] ???Pain in feet when laying flat [] ???History of DVT [] ???Phlebitis [x] ???Swelling in legs [] ???Varicose veins [x] ???Non-healing ulcers Pulmonary: [] ???Uses home oxygen [] ???Productive cough[] ???Hemoptysis [] ???Wheeze [] ???COPD [] ???Asthma Neurologic: [] ???Dizziness [] ???Blackouts [] ???Seizures [] ???History of stroke [] ???History of TIA[] ???Aphasia [] ???Temporary blindness[] ???Dysphagia [] ???Weaknessor numbness in arms [] ???Weakness or numbnessin legs Musculoskeletal: [x] ???Arthritis [] ???Joint swelling [] ???Joint pain [] ???Low back pain Hematologic:[] ???Easy bruising[] ???Easy bleeding [] ???Hypercoagulable state [x] ???Anemic  Gastrointestinal:[] ???Blood in stool[] ???Vomiting blood[x] ???Gastroesophageal reflux/heartburn[] ???Abdominal pain Genitourinary: [] ???Chronic kidney disease [] ???Difficulturination [] ???Frequenturination [] ???Burning with urination[] ???Hematuria Skin: [] ???Rashes [x] ???Ulcers [x] ???Wounds Psychological: [] ???History of anxiety[] ???History of major depression.    Physical Examination  BP (!) 151/75 (BP Location: Right Arm)   Pulse 60   Resp 16   Ht 5\' 8"  (1.727 m)   Wt 163 lb (73.9 kg)   BMI 24.78 kg/m  Gen:  WD/WN, NAD Head: [place]/AT, No temporalis wasting. Ear/Nose/Throat: Hearing grossly intact, nares w/o erythema or drainage Eyes: Conjunctiva clear. Sclera non-icteric Neck: Supple.  Trachea midline Pulmonary:  Good air movement, no use of accessory  muscles.  Cardiac: RRR, no JVD Vascular:  Vessel Right Left  Radial Palpable Palpable                          PT Palpable Palpable  DP Palpable Palpable   Gastrointestinal: soft, non-tender/non-distended. No guarding/reflex.  Musculoskeletal: M/S 5/5 throughout.  No deformity or atrophy. Trace LLE edema. Neurologic: Sensation grossly intact in extremities.  Symmetrical.  Speech is fluent.  Psychiatric: Judgment intact, Mood & affect appropriate for pt's clinical situation. Dermatologic: No rashes or ulcers noted.  No cellulitis or open wounds.       Labs Recent Results (from the past 2160 hour(s))  Basic metabolic panel     Status: Abnormal   Collection Time: 07/09/18 10:43 AM  Result Value Ref Range   Sodium 140 135 - 145 mmol/L   Potassium 4.4 3.5 - 5.1 mmol/L   Chloride 107 98 - 111 mmol/L   CO2 23 22 - 32 mmol/L   Glucose, Bld 107 (H) 70 - 99 mg/dL   BUN 22 8 - 23 mg/dL   Creatinine, Ser 1.54 (H) 0.61 - 1.24 mg/dL   Calcium 8.7 (L) 8.9 - 10.3 mg/dL   GFR calc non Af Amer 45 (L) >60 mL/min   GFR calc Af Amer 53 (L) >60 mL/min   Anion gap 10 5 - 15    Comment: Performed at [facility], [address] [place], [place] [address]  CBC with Differential     Status: Abnormal   Collection Time: 07/09/18 10:43 AM  Result Value Ref Range   WBC 26.2 (H) 4.0 - 10.5 K/uL   RBC  2.68 (L) 4.22 - 5.81 MIL/uL   Hemoglobin 8.7 (L) 13.0 - 17.0 g/dL   HCT 29.4 (L) 39.0 - 52.0 %   MCV 109.7 (H) 80.0 - 100.0 fL   MCH 32.5 26.0 - 34.0 pg   MCHC 29.6 (L) 30.0 - 36.0 g/dL   RDW 16.8 (H) 11.5 - 15.5 %   Platelets 120 (L) 150 - 400 K/uL   nRBC 0.0 0.0 - 0.2 %   Neutrophils Relative % 11 %   Neutro Abs 2.9 1.7 - 7.7 K/uL   Lymphocytes Relative 82 %   Lymphs Abs 21.5 (H) 0.7 - 4.0 K/uL   Monocytes Relative 5 %   Monocytes Absolute 1.3 (H) 0.1 - 1.0 K/uL   Eosinophils Relative 1 %   Eosinophils Absolute 0.2 0.0 - 0.5 K/uL   Basophils Relative 1 %   Basophils Absolute  0.2 (H) 0.0 - 0.1 K/uL   Immature Granulocytes 0 %   Abs Immature Granulocytes 0.05 0.00 - 0.07 K/uL    Comment: Performed at [facility], [address] [place], [place] [address]  Hematocrit     Status: Abnormal   Collection Time: 07/23/18 10:00 AM  Result Value Ref Range   HCT 30.9 (L) 39.0 - 52.0 %    Comment: Performed at [facility], [address] [place], [place] [address]  Hemoglobin     Status: Abnormal   Collection Time: 07/23/18 10:00 AM  Result Value Ref Range   Hemoglobin 9.4 (L) 13.0 - 17.0 g/dL    Comment: Performed at [facility], [address] [place], [place] [address]  Hemoglobin     Status: Abnormal   Collection Time: 08/06/18 10:40 AM  Result Value Ref Range   Hemoglobin 9.4 (L) 13.0 - 17.0 g/dL    Comment: Performed at [facility], [address] [place], [place] [address]  Hematocrit     Status: Abnormal   Collection Time: 08/06/18 10:40 AM  Result Value Ref Range   HCT 31.1 (L) 39.0 - 52.0 %    Comment: Performed at [facility], [address] [place], [place] [address]  Glucose, capillary     Status: Abnormal   Collection Time: 08/10/18  9:13 AM  Result Value Ref Range   Glucose-Capillary 111 (H) 70 - 99 mg/dL  BUN     Status: None   Collection Time: 08/10/18  9:23 AM  Result Value Ref Range   BUN 20 8 - 23 mg/dL    Comment: Performed at [facility], [address] [place], [place] [address]  Creatinine, serum     Status: None   Collection Time: 08/10/18  9:23 AM  Result Value Ref Range   Creatinine, Ser 1.14 0.61 - 1.24 mg/dL   GFR calc non Af Amer >60 >60 mL/min   GFR calc Af Amer >60 >60 mL/min    Comment: Performed at [facility], [address] [place], [place] [address]  Glucose, capillary     Status: None   Collection Time: 08/10/18 11:17 AM  Result Value Ref Range   Glucose-Capillary 99 70 - 99 mg/dL  Hemoglobin     Status: Abnormal   Collection Time: 08/20/18 10:09 AM   Result Value Ref Range   Hemoglobin 9.0 (L) 13.0 - 17.0 g/dL    Comment: Performed at [facility] [address], [place], [place] [address]  Hematocrit     Status: Abnormal   Collection Time: 08/20/18 10:09 AM  Result Value Ref Range   HCT 29.6 (L) 39.0 - 52.0 %    Comment: Performed at [facility], [address] [place], [place] [address]  Comprehensive metabolic panel     Status: Abnormal   Collection Time: 08/20/18 10:09 AM  Result Value Ref Range   Sodium 140 135 - 145 mmol/L   Potassium 5.3 (H) 3.5 - 5.1 mmol/L   Chloride 109 98 - 111 mmol/L   CO2 25 22 - 32 mmol/L   Glucose, Bld 118 (H) 70 - 99 mg/dL   BUN 22 8 - 23 mg/dL   Creatinine, Ser 1.57 (H) 0.61 - 1.24 mg/dL   Calcium 8.5 (L) 8.9 - 10.3 mg/dL   Total Protein 6.9 6.5 - 8.1 g/dL   Albumin 3.7 3.5 - 5.0 g/dL   AST 12 (L) 15 - 41 U/L   ALT 9 0 - 44 U/L   Alkaline Phosphatase 83 38 - 126 U/L   Total Bilirubin 0.4 0.3 - 1.2 mg/dL   GFR calc non Af Amer 44 (L) >60 mL/min   GFR calc Af Amer 51 (L) >60 mL/min   Anion gap 6 5 - 15    Comment: Performed at [facility], [address] [place], [place] [address]  CBC with Differential     Status: Abnormal   Collection Time: 09/24/18  2:47 PM  Result Value Ref Range   WBC 10.2 4.0 - 10.5 K/uL   RBC 2.70 (L) 4.22 - 5.81 MIL/uL   Hemoglobin 8.8 (L) 13.0 - 17.0 g/dL   HCT 28.4 (L) 39.0 - 52.0 %   MCV 105.2 (H) 80.0 - 100.0 fL   MCH 32.6 26.0 - 34.0 pg   MCHC 31.0 30.0 - 36.0 g/dL   RDW 16.6 (H) 11.5 - 15.5 %   Platelets 87 (L) 150 - 400 K/uL   nRBC 0.0 0.0 - 0.2 %   Neutrophils Relative % 33 %   Neutro Abs 3.3 1.7 - 7.7 K/uL   Lymphocytes Relative 60 %   Lymphs Abs 6.1 (H) 0.7 - 4.0 K/uL   Monocytes Relative 6 %   Monocytes Absolute 0.6 0.1 - 1.0 K/uL   Eosinophils Relative 0 %   Eosinophils Absolute 0.0 0.0 - 0.5 K/uL   Basophils Relative 1 %   Basophils Absolute 0.1 0.0 - 0.1 K/uL   Immature Granulocytes 0 %   Abs Immature Granulocytes  0.04 0.00 - 0.07 K/uL    Comment: Performed at [facility], [address], [place], [place] [address]    Radiology No results found.  Assessment/Plan Essential hypertension blood pressure control important in reducing the progression of atherosclerotic disease. On appropriate oral medications.   Type 2 diabetes mellitus with complication (HCC) blood glucose control important in reducing the progression of atherosclerotic disease. Also, involved in wound healing. On appropriate medications.  Hyperlipidemia lipid control important in reducing the progression of atherosclerotic disease. Continue statin therapy  Atherosclerotic peripheral vascular disease with intermittent claudication (HCC) Markedly improved after revascularization a few weeks ago.  ABIs now are normal at 0.99 on the right and 1.0 on the left.  Good digital waveforms. Continue medical regimen. Recheck in 3-4 months.    Leotis Pain, MD  09/25/2018 10:50 AM    This note was created with Dragon medical transcription system.  Any errors from dictation are purely unintentional

## 2018-09-25 NOTE — Assessment & Plan Note (Signed)
Markedly improved after revascularization a few weeks ago.  ABIs now are normal at 0.99 on the right and 1.0 on the left.  Good digital waveforms. Continue medical regimen. Recheck in 3-4 months.

## 2018-10-05 ENCOUNTER — Other Ambulatory Visit: Payer: Self-pay | Admitting: Oncology

## 2018-10-05 MED FILL — IMBRUVICA 280 MG TAB: 280 | 28 days supply | Qty: 28 | Fill #5

## 2018-10-12 ENCOUNTER — Other Ambulatory Visit: Payer: Self-pay | Admitting: *Deleted

## 2018-10-12 DIAGNOSIS — C9112 Chronic lymphocytic leukemia of B-cell type in relapse: Secondary | ICD-10-CM

## 2018-10-12 DIAGNOSIS — C9192 Lymphoid leukemia, unspecified, in relapse: Secondary | ICD-10-CM

## 2018-10-12 DIAGNOSIS — C8300 Small cell B-cell lymphoma, unspecified site: Secondary | ICD-10-CM

## 2018-10-12 MED ORDER — OXYCODONE-ACETAMINOPHEN 5-325 MG PO TABS: ORAL_TABLET | ORAL | 0 refills | Status: DC

## 2018-10-12 NOTE — Telephone Encounter (Signed)
Patient called cancer center requesting refill of Percocet 5-325 mg q 8-12 hours. .   As mandated by the [place] STOP Act (Strengthen Opioid Misuse Prevention), the [place] Controlled Substance Reporting System ([place]) was reviewed for this patient.  Below is the past 78-months of controlled substance prescriptions as displayed by the registry.  I have personally consulted with my supervising physician, Dr. Rogue Bussing, who agrees that continuation of opiate therapy is medically appropriate at this time and agrees to provide continual monitoring, including urine/blood drug screens, as indicated. Refill is appropriate on or after 10/12/18.  [place]CC[place] reviewed: OK- Reviewed.    Faythe Casa, NP 10/12/2018 11:09 AM [phone]

## 2018-10-12 NOTE — Telephone Encounter (Signed)
Patient needs an apt this week for lab/md/aranesp-Dr. B sch

## 2018-10-12 NOTE — Telephone Encounter (Signed)
Patient asking when he is to return for his appointment for injection. He has no follow up appointment, but last disposition note: View all       Follow-up and Disposition History  09/17/2018 1202 - Cammie Sickle, MD  Check-out note:  # DISPOSITION:  # next week- cbc/possible aranesp.   # in 4 weeks- MD-cbc/bmp/possible aranesp-Dr.B

## 2018-10-14 ENCOUNTER — Other Ambulatory Visit: Payer: Self-pay

## 2018-10-14 ENCOUNTER — Inpatient Hospital Stay (HOSPITAL_BASED_OUTPATIENT_CLINIC_OR_DEPARTMENT_OTHER): Payer: Medicare Other | Admitting: Internal Medicine

## 2018-10-14 ENCOUNTER — Inpatient Hospital Stay: Payer: Medicare Other

## 2018-10-14 ENCOUNTER — Inpatient Hospital Stay: Payer: Medicare Other | Attending: Internal Medicine

## 2018-10-14 ENCOUNTER — Telehealth: Payer: Self-pay | Admitting: Internal Medicine

## 2018-10-14 DIAGNOSIS — Z79899 Other long term (current) drug therapy: Secondary | ICD-10-CM | POA: Diagnosis not present

## 2018-10-14 DIAGNOSIS — F17210 Nicotine dependence, cigarettes, uncomplicated: Secondary | ICD-10-CM

## 2018-10-14 DIAGNOSIS — C9112 Chronic lymphocytic leukemia of B-cell type in relapse: Secondary | ICD-10-CM | POA: Insufficient documentation

## 2018-10-14 DIAGNOSIS — I129 Hypertensive chronic kidney disease with stage 1 through stage 4 chronic kidney disease, or unspecified chronic kidney disease: Secondary | ICD-10-CM | POA: Diagnosis not present

## 2018-10-14 DIAGNOSIS — R109 Unspecified abdominal pain: Secondary | ICD-10-CM | POA: Diagnosis not present

## 2018-10-14 DIAGNOSIS — D631 Anemia in chronic kidney disease: Secondary | ICD-10-CM | POA: Insufficient documentation

## 2018-10-14 DIAGNOSIS — I70219 Atherosclerosis of native arteries of extremities with intermittent claudication, unspecified extremity: Secondary | ICD-10-CM

## 2018-10-14 DIAGNOSIS — C9192 Lymphoid leukemia, unspecified, in relapse: Secondary | ICD-10-CM

## 2018-10-14 DIAGNOSIS — N189 Chronic kidney disease, unspecified: Secondary | ICD-10-CM

## 2018-10-14 DIAGNOSIS — C8300 Small cell B-cell lymphoma, unspecified site: Secondary | ICD-10-CM

## 2018-10-14 DIAGNOSIS — E119 Type 2 diabetes mellitus without complications: Secondary | ICD-10-CM | POA: Insufficient documentation

## 2018-10-14 DIAGNOSIS — Z7982 Long term (current) use of aspirin: Secondary | ICD-10-CM | POA: Insufficient documentation

## 2018-10-14 LAB — CBC WITH DIFFERENTIAL/PLATELET
Abs Immature Granulocytes: 0.06 10*3/uL (ref 0.00–0.07)
Basophils Absolute: 0.1 10*3/uL (ref 0.0–0.1)
Basophils Relative: 2 %
Eosinophils Absolute: 0.1 10*3/uL (ref 0.0–0.5)
Eosinophils Relative: 1 %
HCT: 28.3 % — ABNORMAL LOW (ref 39.0–52.0)
Hemoglobin: 8.6 g/dL — ABNORMAL LOW (ref 13.0–17.0)
Immature Granulocytes: 1 %
Lymphocytes Relative: 53 %
Lymphs Abs: 3.9 10*3/uL (ref 0.7–4.0)
MCH: 32 pg (ref 26.0–34.0)
MCHC: 30.4 g/dL (ref 30.0–36.0)
MCV: 105.2 fL — ABNORMAL HIGH (ref 80.0–100.0)
Monocytes Absolute: 0.7 10*3/uL (ref 0.1–1.0)
Monocytes Relative: 9 %
Neutro Abs: 2.4 10*3/uL (ref 1.7–7.7)
Neutrophils Relative %: 34 %
Platelets: 81 10*3/uL — ABNORMAL LOW (ref 150–400)
RBC: 2.69 MIL/uL — ABNORMAL LOW (ref 4.22–5.81)
RDW: 17.5 % — ABNORMAL HIGH (ref 11.5–15.5)
WBC: 7.2 10*3/uL (ref 4.0–10.5)
nRBC: 0 % (ref 0.0–0.2)

## 2018-10-14 LAB — BASIC METABOLIC PANEL
Anion gap: 10 (ref 5–15)
BUN: 19 mg/dL (ref 8–23)
CO2: 22 mmol/L (ref 22–32)
Calcium: 8.9 mg/dL (ref 8.9–10.3)
Chloride: 111 mmol/L (ref 98–111)
Creatinine, Ser: 1.54 mg/dL — ABNORMAL HIGH (ref 0.61–1.24)
GFR calc Af Amer: 53 mL/min — ABNORMAL LOW (ref >60)
GFR calc non Af Amer: 45 mL/min — ABNORMAL LOW (ref >60)
Glucose, Bld: 125 mg/dL — ABNORMAL HIGH (ref 70–99)
Potassium: 5.1 mmol/L (ref 3.5–5.1)
Sodium: 143 mmol/L (ref 135–145)

## 2018-10-14 MED ORDER — DARBEPOETIN ALFA 300 MCG/0.6ML IJ SOSY
300.0000 ug | PREFILLED_SYRINGE | Freq: Once | INTRAMUSCULAR | Status: AC
  Administered 2018-10-14: 300 ug via SUBCUTANEOUS
  Filled 2018-10-14: qty 0.6

## 2018-10-14 NOTE — Progress Notes (Signed)
[facility] [place] OFFICE PROGRESS NOTE  Patient Care Team: [name], MD as PCP - General (Internal Medicine)  Cancer Staging No matching staging information was found for the patient.   Oncology History Overview Note  # 2006- CLL STAGE IV; MAY 2011- WBC- 57K;Platelets-99;Hb-12/CT Bulky LN; BMBx- 80% Invol; del 11; START Benda-Ritux x4 [finished Sep 2011];   # July 2015-Progression; Sep 2015-START ibrutinib; CT scan DEC 2015- Improvement LN; Cont Ibrutinib 126m/d; NOV 2016 CT- 1-2CM LN [mild progression compared to Dec 2015];NOV 2016- FISH peripheral blood- NO MUTATIONS/CD-38 Positive; NOV 7th- CONT IBRUTINIB 2 pills/day; CT AUG 2017- STABLE;  DEC 6th PET- Mild RP LN/ Retrocrural LN  # OFF ibrutinib [? intol]- sep 2019- Jan 16th 2020; Re-start Ibrutinib   # Jan 16th 2020- start aranesp  # DEC 2019- PAIN CONTRACT  #October 2019-bone marrow biopsy [worsening anemia]-question dyserythropoietic changes/small clone of CLL;  # FOUNDATION [facility] HEM- NEG.  SA skin infection [Oct 2016] s/p clinda -------------------------------------------------------    DIAGNOSIS: CLL  STAGE:   IV      ;GOALS: palliative  CURRENT/MOST RECENT THERAPY : Ibrutinib.    CLL (chronic lymphoid leukemia) in relapse ([facility]    INTERVAL HISTORY:  Fernie F Frankie 68y.o.  male pleasant patient above history of CLL [currently off ibrutinib] and chronic abdominal pain of unclear etiology; worsening anemia of unclear etiology/? CKD is here for follow-up.  [facility].  [facility].  He continues to have abdominal pain.  He continues to take Percocet.  He states abdominal pain is slightly improved with the Carafate.  Mild to moderate nausea but no vomiting.  Review of Systems  Constitutional: Positive for malaise/fatigue and weight loss. Negative for chills, diaphoresis and  fever.  HENT: Negative for nosebleeds and sore throat.   Eyes: Negative for double vision.  Respiratory: Negative for cough, hemoptysis, sputum production, shortness of breath and wheezing.   Cardiovascular: Negative for chest pain, palpitations, orthopnea and leg swelling.  Gastrointestinal: Positive for abdominal pain and nausea. Negative for blood in stool, constipation, diarrhea, heartburn, melena and vomiting.  Genitourinary: Negative for dysuria, frequency and urgency.  Skin: Negative.  Negative for itching and rash.  Neurological: Negative for dizziness, tingling, focal weakness, weakness and headaches.  Endo/Heme/Allergies: Does not bruise/bleed easily.  Psychiatric/Behavioral: Negative for depression. The patient is not nervous/anxious and does not have insomnia.       PAST MEDICAL HISTORY :  Past Medical History:  Diagnosis Date  . CLL (chronic lymphocytic leukemia) ([place]   . Diabetes mellitus without complication ([place]   . Hematuria   . Hypertension     PAST SURGICAL HISTORY :   Past Surgical History:  Procedure Laterality Date  . CATARACT EXTRACTION W/ INTRAOCULAR LENS IMPLANT Bilateral   . CHOLECYSTECTOMY  1983  . COLONOSCOPY WITH PROPOFOL N/A 10/27/2017   Procedure: COLONOSCOPY WITH PROPOFOL;  Surgeon: EManya Silvas MD;  [facility];  Service: Endoscopy;  Laterality: N/A;  . ESOPHAGOGASTRODUODENOSCOPY (EGD) WITH PROPOFOL N/A 11/20/2016   Procedure: ESOPHAGOGASTRODUODENOSCOPY (EGD) WITH PROPOFOL;  Surgeon: EManya Silvas MD;  [facility];  Service: Endoscopy;  Laterality: N/A;  . ESOPHAGOGASTRODUODENOSCOPY (EGD) WITH PROPOFOL N/A 10/27/2017   Procedure: ESOPHAGOGASTRODUODENOSCOPY (EGD) WITH PROPOFOL;  Surgeon: EManya Silvas MD;  [facility];  Service: Endoscopy;  Laterality: N/A;  . LOWER EXTREMITY ANGIOGRAPHY Right 05/19/2017   Procedure: LOWER EXTREMITY ANGIOGRAPHY;  Surgeon: Algernon Huxley, MD;  Location: [place] CV  LAB;  Service: Cardiovascular;  Laterality: Right;  . LOWER EXTREMITY ANGIOGRAPHY Left 08/10/2018   Procedure: LOWER EXTREMITY ANGIOGRAPHY;  Surgeon: Algernon Huxley, MD;  Location: [place] CV LAB;  Service: Cardiovascular;  Laterality: Left;    FAMILY HISTORY :   Family History  Problem Relation Age of Onset  . Hypertension Sister     SOCIAL HISTORY:   Social History   Tobacco Use  . Smoking status: Current Every Day Smoker    Packs/day: 0.50    Years: 47.00    Pack years: 23.50    Types: Cigarettes  . Smokeless tobacco: Never Used  Substance Use Topics  . Alcohol use: Yes    Alcohol/week: 1.0 standard drinks    Types: 1 Glasses of wine per week    Comment:  almost none in last 6 months  . Drug use: No    ALLERGIES:  has No Known Allergies.  MEDICATIONS:  Current Outpatient Medications  Medication Sig Dispense Refill  . amLODipine (NORVASC) 10 MG tablet Take 1 tablet by mouth once daily 30 tablet 0  . aspirin EC 81 MG tablet Take 81 mg by mouth daily.    Marland Kitchen atorvastatin (LIPITOR) 10 MG tablet Take 1 tablet (10 mg total) by mouth daily. 30 tablet 11  . clopidogrel (PLAVIX) 75 MG tablet Take 1 tablet by mouth once daily 90 tablet 0  . IMBRUVICA 280 MG TABS TAKE 1 TABLET BY MOUTH DAILY. 28 tablet 6  . lisinopril (PRINIVIL,ZESTRIL) 30 MG tablet Take 30 mg by mouth daily.  1  . metFORMIN (GLUCOPHAGE) 1000 MG tablet Take 1,000 mg by mouth 2 (two) times daily.    . metoprolol tartrate (LOPRESSOR) 25 MG tablet Take 1 tablet by mouth twice daily 180 tablet 0  . omeprazole (PRILOSEC) 40 MG capsule Take 40 mg by mouth daily.     Marland Kitchen oxyCODONE-acetaminophen (PERCOCET/ROXICET) 5-325 MG tablet 1 pill every 8-12 hours 90 tablet 0  . sodium polystyrene (SPS) 15 GM/60ML suspension TAKE AS DIRECTED (30 GRAMS) FOR 1 DOSE    . sucralfate (CARAFATE) 1 g tablet DISSOLVE 1 TABLET IN WATER AS DIRECTED BY YOUR DOCTOR AND DRINK BY MOUTH 4 TIMES DAILY (BEFORE MEALS AND NIGHTLY)    . iron  polysaccharides (NU-IRON) 150 MG capsule Take 150 mg by mouth daily.      No current facility-administered medications for this visit.     PHYSICAL EXAMINATION: ECOG PERFORMANCE STATUS: 1 - Symptomatic but completely ambulatory  BP (!) 120/56   Pulse 60   Resp 16   Wt 163 lb 9.6 oz (74.2 kg)   BMI 24.88 kg/m   Filed Weights   10/14/18 0844  Weight: 163 lb 9.6 oz (74.2 kg)    Physical Exam  Constitutional: He is oriented to person, place, and time and well-developed, well-nourished, and in no distress.  He is alone.  Appears pale.  HENT:  Head: Normocephalic and atraumatic.  Mouth/Throat: Oropharynx is clear and moist. No oropharyngeal exudate.  Eyes: Pupils are equal, round, and reactive to light.  Neck: Normal range of motion. Neck supple.  Cardiovascular: Normal rate and regular rhythm.  Pulmonary/Chest: No respiratory distress. He has no wheezes.  Decreased air entry bil.   Abdominal: Soft. Bowel sounds are normal. He exhibits no distension and no mass. There is no abdominal tenderness. There is no rebound and no guarding.  Musculoskeletal: Normal range of motion.  General: No tenderness or edema.  Neurological: He is alert and oriented to person, place, and time.  Skin: Skin is warm. There is pallor.  Psychiatric: Affect normal.      LABORATORY DATA:  I have reviewed the data as listed    Component Value Date/Time   NA 143 10/14/2018 0821   NA 142 05/03/2014 1139   K 5.1 10/14/2018 0821   K 4.7 05/03/2014 1139   CL 111 10/14/2018 0821   CL 110 (H) 05/03/2014 1139   CO2 22 10/14/2018 0821   CO2 24 05/03/2014 1139   GLUCOSE 125 (H) 10/14/2018 0821   GLUCOSE 98 05/03/2014 1139   BUN 19 10/14/2018 0821   BUN 20 (H) 05/03/2014 1139   CREATININE 1.54 (H) 10/14/2018 0821   CREATININE 1.22 08/09/2014 1122   CALCIUM 8.9 10/14/2018 0821   CALCIUM 8.6 05/03/2014 1139   PROT 6.9 08/20/2018 1009   PROT 7.5 05/03/2014 1139   ALBUMIN 3.7 08/20/2018 1009    ALBUMIN 4.1 05/03/2014 1139   AST 12 (L) 08/20/2018 1009   AST 15 05/03/2014 1139   ALT 9 08/20/2018 1009   ALT 26 05/03/2014 1139   ALKPHOS 83 08/20/2018 1009   ALKPHOS 94 05/03/2014 1139   BILITOT 0.4 08/20/2018 1009   BILITOT 0.5 05/03/2014 1139   GFRNONAA 45 (L) 10/14/2018 0821   GFRNONAA >60 08/09/2014 1122   GFRAA 53 (L) 10/14/2018 0821   GFRAA >60 08/09/2014 1122    No results found for: SPEP, UPEP  Lab Results  Component Value Date   WBC 7.2 10/14/2018   NEUTROABS 2.4 10/14/2018   HGB 8.6 (L) 10/14/2018   HCT 28.3 (L) 10/14/2018   MCV 105.2 (H) 10/14/2018   PLT 81 (L) 10/14/2018      Chemistry      Component Value Date/Time   NA 143 10/14/2018 0821   NA 142 05/03/2014 1139   K 5.1 10/14/2018 0821   K 4.7 05/03/2014 1139   CL 111 10/14/2018 0821   CL 110 (H) 05/03/2014 1139   CO2 22 10/14/2018 0821   CO2 24 05/03/2014 1139   BUN 19 10/14/2018 0821   BUN 20 (H) 05/03/2014 1139   CREATININE 1.54 (H) 10/14/2018 0821   CREATININE 1.22 08/09/2014 1122      Component Value Date/Time   CALCIUM 8.9 10/14/2018 0821   CALCIUM 8.6 05/03/2014 1139   ALKPHOS 83 08/20/2018 1009   ALKPHOS 94 05/03/2014 1139   AST 12 (L) 08/20/2018 1009   AST 15 05/03/2014 1139   ALT 9 08/20/2018 1009   ALT 26 05/03/2014 1139   BILITOT 0.4 08/20/2018 1009   BILITOT 0.5 05/03/2014 1139       RADIOGRAPHIC STUDIES: I have personally reviewed the radiological images as listed and agreed with the findings in the report. No results found.   ASSESSMENT & PLAN:  CLL (chronic lymphoid leukemia) in relapse (HCC) # CLL/SLL- relapsed currently on lower dose ibrutinib [sec to intolerance in sep 2019]; SEP 24th 2019-CT scan shows no progressive/worsening lymphadenopathy.  Ibrutinib 280 mg once a day. Stable; will get imaging at next visit.   #Anemia hemoglobin ~8.6. CLL/CKD vs MD-last bone marrow biopsy hypercellular; low CLL burden; FISH-inconclusive for MDS.  I would recommend checking  NGS for foundation 1 heme.  Discussed with patient/insurance issues.  Continue to 300 mcg q 2 weeks.  Iron adequate.  # Abdominal pain/dyspepsia/unclear etiology; stable.  Continue Percocet for now. S/p GI eval at [facility]; awaiting hepatology evaluation; [place]-  normal.   # DISPOSITION:  # aranesp today # in 3 weeks- cbc/Foundation One-Hem- possible aranesp.   # in 6 weeks- MD-cbc/bmp/LDH-possible aranesp-Dr.B    No orders of the defined types were placed in this encounter.  All questions were answered. The patient knows to call the clinic with any problems, questions or concerns.      Cammie Sickle, MD 10/14/2018 12:27 PM

## 2018-10-14 NOTE — Telephone Encounter (Signed)
Per Dr. B the foundation heme was already done previously. No need to repeat at this time.

## 2018-10-14 NOTE — Telephone Encounter (Signed)
James [name]-please order foundation 1 heme Dx; MDS/CLL at next lab draw in 3 weeks. Thx

## 2018-10-14 NOTE — Assessment & Plan Note (Addendum)
#  CLL/SLL- relapsed currently on lower dose ibrutinib [sec to intolerance in sep 2019]; SEP 24th 2019-CT scan shows no progressive/worsening lymphadenopathy.  Ibrutinib 280 mg once a day. Stable; will get imaging at next visit.   #Anemia hemoglobin ~8.6. CLL/CKD vs MD-last bone marrow biopsy hypercellular; low CLL burden; FISH-inconclusive for MDS.  I would recommend checking NGS for foundation 1 heme.  Discussed with patient/insurance issues.  Continue to 300 mcg q 2 weeks.  Iron adequate.  # Abdominal pain/dyspepsia/unclear etiology; stable.  Continue Percocet for now. S/p GI eval at [facility]; awaiting hepatology evaluation; [place]- normal.   # DISPOSITION:  # aranesp today # in 3 weeks- cbc/Foundation One-Hem- possible aranesp.   # in 6 weeks- MD-cbc/bmp/LDH-possible aranesp-Dr.B

## 2018-10-14 NOTE — Progress Notes (Signed)
Patient still having upper abdominal pain that requires him to take the pain medication on a routine basis.  He took Oxycodone at 6:30 this morning and pain is at 3/10 right now.  Does have Carafate but it does not help with the abdominal pain.

## 2018-10-28 ENCOUNTER — Other Ambulatory Visit (INDEPENDENT_AMBULATORY_CARE_PROVIDER_SITE_OTHER): Payer: Self-pay | Admitting: Vascular Surgery

## 2018-10-28 ENCOUNTER — Telehealth: Payer: Self-pay | Admitting: Pharmacy Technician

## 2018-10-28 NOTE — Telephone Encounter (Signed)
Oral Oncology Patient Advocate Encounter   Was successful in renewing patients CLL grant from [facility]) in the amount of $8400.  This grant will continue to provide copayment coverage for Imbruvica and will reduce out of pocket cost to $0.   The billing information is as follows and has been shared with [facility].   Member ID: [id number] Group ID: [id number] RxBin: [id number] Dates of Eligibility: 11/28/2018 through 11/27/2019  [place] Phone [phone] Fax [phone] 10/28/2018 10:02 AM

## 2018-11-02 ENCOUNTER — Other Ambulatory Visit: Payer: Self-pay | Admitting: Internal Medicine

## 2018-11-02 DIAGNOSIS — G8929 Other chronic pain: Secondary | ICD-10-CM

## 2018-11-02 DIAGNOSIS — R1013 Epigastric pain: Secondary | ICD-10-CM

## 2018-11-02 DIAGNOSIS — C9110 Chronic lymphocytic leukemia of B-cell type not having achieved remission: Secondary | ICD-10-CM

## 2018-11-02 MED FILL — IMBRUVICA 280 MG TAB: 280 | 28 days supply | Qty: 28 | Fill #6

## 2018-11-03 ENCOUNTER — Other Ambulatory Visit: Payer: Self-pay

## 2018-11-03 ENCOUNTER — Other Ambulatory Visit: Payer: Self-pay | Admitting: *Deleted

## 2018-11-03 DIAGNOSIS — D631 Anemia in chronic kidney disease: Secondary | ICD-10-CM

## 2018-11-03 DIAGNOSIS — C9192 Lymphoid leukemia, unspecified, in relapse: Secondary | ICD-10-CM

## 2018-11-03 DIAGNOSIS — E1122 Type 2 diabetes mellitus with diabetic chronic kidney disease: Secondary | ICD-10-CM

## 2018-11-03 DIAGNOSIS — N183 Type 2 diabetes mellitus with diabetic chronic kidney disease: Secondary | ICD-10-CM

## 2018-11-03 DIAGNOSIS — C9112 Chronic lymphocytic leukemia of B-cell type in relapse: Secondary | ICD-10-CM

## 2018-11-04 ENCOUNTER — Other Ambulatory Visit: Payer: Self-pay

## 2018-11-04 ENCOUNTER — Inpatient Hospital Stay: Payer: Medicare Other | Attending: Internal Medicine

## 2018-11-04 ENCOUNTER — Inpatient Hospital Stay: Payer: Medicare Other

## 2018-11-04 VITALS — BP 116/65 | HR 48

## 2018-11-04 DIAGNOSIS — Z79899 Other long term (current) drug therapy: Secondary | ICD-10-CM | POA: Diagnosis not present

## 2018-11-04 DIAGNOSIS — D631 Anemia in chronic kidney disease: Secondary | ICD-10-CM

## 2018-11-04 DIAGNOSIS — C9192 Lymphoid leukemia, unspecified, in relapse: Secondary | ICD-10-CM | POA: Insufficient documentation

## 2018-11-04 DIAGNOSIS — E1122 Type 2 diabetes mellitus with diabetic chronic kidney disease: Secondary | ICD-10-CM | POA: Diagnosis not present

## 2018-11-04 DIAGNOSIS — F17210 Nicotine dependence, cigarettes, uncomplicated: Secondary | ICD-10-CM | POA: Insufficient documentation

## 2018-11-04 DIAGNOSIS — N183 Chronic kidney disease, stage 3 unspecified: Secondary | ICD-10-CM

## 2018-11-04 DIAGNOSIS — I129 Hypertensive chronic kidney disease with stage 1 through stage 4 chronic kidney disease, or unspecified chronic kidney disease: Secondary | ICD-10-CM | POA: Insufficient documentation

## 2018-11-04 DIAGNOSIS — C9112 Chronic lymphocytic leukemia of B-cell type in relapse: Secondary | ICD-10-CM

## 2018-11-04 LAB — CBC WITH DIFFERENTIAL/PLATELET
Abs Immature Granulocytes: 0.03 10*3/uL (ref 0.00–0.07)
Basophils Absolute: 0.1 10*3/uL (ref 0.0–0.1)
Basophils Relative: 1 %
Eosinophils Absolute: 0.1 10*3/uL (ref 0.0–0.5)
Eosinophils Relative: 1 %
HCT: 25.4 % — ABNORMAL LOW (ref 39.0–52.0)
Hemoglobin: 8.1 g/dL — ABNORMAL LOW (ref 13.0–17.0)
Immature Granulocytes: 0 %
Lymphocytes Relative: 53 %
Lymphs Abs: 6.6 10*3/uL — ABNORMAL HIGH (ref 0.7–4.0)
MCH: 33.3 pg (ref 26.0–34.0)
MCHC: 31.9 g/dL (ref 30.0–36.0)
MCV: 104.5 fL — ABNORMAL HIGH (ref 80.0–100.0)
Monocytes Absolute: 4.5 10*3/uL — ABNORMAL HIGH (ref 0.1–1.0)
Monocytes Relative: 37 %
Neutro Abs: 0.9 10*3/uL — ABNORMAL LOW (ref 1.7–7.7)
Neutrophils Relative %: 8 %
Platelets: 87 10*3/uL — ABNORMAL LOW (ref 150–400)
RBC: 2.43 MIL/uL — ABNORMAL LOW (ref 4.22–5.81)
RDW: 18.1 % — ABNORMAL HIGH (ref 11.5–15.5)
Smear Review: DECREASED
WBC: 12 10*3/uL — ABNORMAL HIGH (ref 4.0–10.5)
nRBC: 0 % (ref 0.0–0.2)

## 2018-11-04 MED ORDER — DARBEPOETIN ALFA 300 MCG/0.6ML IJ SOSY
300.0000 ug | PREFILLED_SYRINGE | Freq: Once | INTRAMUSCULAR | Status: AC
  Administered 2018-11-04: 10:00:00 300 ug via SUBCUTANEOUS
  Filled 2018-11-04: qty 0.6

## 2018-11-11 ENCOUNTER — Other Ambulatory Visit: Payer: Self-pay | Admitting: *Deleted

## 2018-11-11 DIAGNOSIS — C8300 Small cell B-cell lymphoma, unspecified site: Secondary | ICD-10-CM

## 2018-11-11 DIAGNOSIS — C9192 Lymphoid leukemia, unspecified, in relapse: Secondary | ICD-10-CM

## 2018-11-11 DIAGNOSIS — C9112 Chronic lymphocytic leukemia of B-cell type in relapse: Secondary | ICD-10-CM

## 2018-11-11 MED ORDER — OXYCODONE-ACETAMINOPHEN 5-325 MG PO TABS: ORAL_TABLET | ORAL | 0 refills | Status: DC

## 2018-11-11 NOTE — Telephone Encounter (Signed)
Pt Requesting rf for percocet

## 2018-11-12 ENCOUNTER — Other Ambulatory Visit: Payer: Self-pay | Admitting: *Deleted

## 2018-11-12 DIAGNOSIS — C9192 Lymphoid leukemia, unspecified, in relapse: Secondary | ICD-10-CM

## 2018-11-12 DIAGNOSIS — C9112 Chronic lymphocytic leukemia of B-cell type in relapse: Secondary | ICD-10-CM

## 2018-11-12 DIAGNOSIS — C8300 Small cell B-cell lymphoma, unspecified site: Secondary | ICD-10-CM

## 2018-11-13 MED ORDER — OXYCODONE-ACETAMINOPHEN 5-325 MG PO TABS: ORAL_TABLET | ORAL | 0 refills | Status: DC

## 2018-11-13 NOTE — Telephone Encounter (Signed)
Patient called cancer center requesting refill of Percocet 5/325 mg every 8-12 hours PRN for pain.  As mandated by the [place] STOP Act (Strengthen Opioid Misuse Prevention), the [place] Controlled Substance Reporting System ([place]) was reviewed for this patient. Below is the past 58-months of controlled substance prescriptions as displayed by the registry. I have personally consulted with my supervising physician, Dr. Grayland Ormond, who agrees that continuation of opiate therapy is medically appropriate at this time and agrees to provide continual monitoring, including urine/blood drug screens, as indicated. Refill is appropriate on or after 11/11/2018.  Last refilled on 10/12/2018.Marland Kitchen  NCCSRS reviewed: Okay and appropriate for refill.    Faythe Casa, NP 11/13/2018 10:46 AM [phone]

## 2018-11-17 ENCOUNTER — Ambulatory Visit
Admission: RE | Admit: 2018-11-17 | Discharge: 2018-11-17 | Disposition: A | Discharge status: Home / Self Care | Payer: Medicare Other | Source: Ambulatory Visit | Attending: Internal Medicine | Admitting: Internal Medicine

## 2018-11-17 ENCOUNTER — Other Ambulatory Visit: Payer: Self-pay

## 2018-11-17 DIAGNOSIS — R1013 Epigastric pain: Secondary | ICD-10-CM | POA: Insufficient documentation

## 2018-11-17 DIAGNOSIS — G8929 Other chronic pain: Secondary | ICD-10-CM

## 2018-11-17 DIAGNOSIS — C9110 Chronic lymphocytic leukemia of B-cell type not having achieved remission: Secondary | ICD-10-CM | POA: Diagnosis present

## 2018-11-17 MED ORDER — IOHEXOL 300 MG/ML  SOLN
100.0000 mL | Freq: Once | INTRAMUSCULAR | Status: AC | PRN
  Administered 2018-11-17: 100 mL via INTRAVENOUS

## 2018-11-19 ENCOUNTER — Other Ambulatory Visit: Payer: Self-pay | Admitting: Oncology

## 2018-11-24 ENCOUNTER — Other Ambulatory Visit: Payer: Self-pay

## 2018-11-25 ENCOUNTER — Other Ambulatory Visit: Payer: Self-pay

## 2018-11-25 ENCOUNTER — Inpatient Hospital Stay (HOSPITAL_BASED_OUTPATIENT_CLINIC_OR_DEPARTMENT_OTHER): Payer: Medicare Other | Admitting: Internal Medicine

## 2018-11-25 ENCOUNTER — Inpatient Hospital Stay: Payer: Medicare Other

## 2018-11-25 ENCOUNTER — Other Ambulatory Visit: Payer: Self-pay | Admitting: Internal Medicine

## 2018-11-25 DIAGNOSIS — C9192 Lymphoid leukemia, unspecified, in relapse: Secondary | ICD-10-CM

## 2018-11-25 DIAGNOSIS — D631 Anemia in chronic kidney disease: Secondary | ICD-10-CM

## 2018-11-25 DIAGNOSIS — I129 Hypertensive chronic kidney disease with stage 1 through stage 4 chronic kidney disease, or unspecified chronic kidney disease: Secondary | ICD-10-CM

## 2018-11-25 DIAGNOSIS — C9112 Chronic lymphocytic leukemia of B-cell type in relapse: Secondary | ICD-10-CM

## 2018-11-25 DIAGNOSIS — E1122 Type 2 diabetes mellitus with diabetic chronic kidney disease: Secondary | ICD-10-CM

## 2018-11-25 DIAGNOSIS — N183 Chronic kidney disease, stage 3 unspecified: Secondary | ICD-10-CM

## 2018-11-25 DIAGNOSIS — R109 Unspecified abdominal pain: Secondary | ICD-10-CM | POA: Diagnosis not present

## 2018-11-25 DIAGNOSIS — N189 Chronic kidney disease, unspecified: Secondary | ICD-10-CM | POA: Diagnosis not present

## 2018-11-25 LAB — CBC WITH DIFFERENTIAL/PLATELET
Abs Immature Granulocytes: 0.01 10*3/uL (ref 0.00–0.07)
Basophils Absolute: 0.1 10*3/uL (ref 0.0–0.1)
Basophils Relative: 1 %
Eosinophils Absolute: 0.1 10*3/uL (ref 0.0–0.5)
Eosinophils Relative: 1 %
HCT: 28.2 % — ABNORMAL LOW (ref 39.0–52.0)
Hemoglobin: 8.6 g/dL — ABNORMAL LOW (ref 13.0–17.0)
Immature Granulocytes: 0 %
Lymphocytes Relative: 77 %
Lymphs Abs: 9.8 10*3/uL — ABNORMAL HIGH (ref 0.7–4.0)
MCH: 33.2 pg (ref 26.0–34.0)
MCHC: 30.5 g/dL (ref 30.0–36.0)
MCV: 108.9 fL — ABNORMAL HIGH (ref 80.0–100.0)
Monocytes Absolute: 0.7 10*3/uL (ref 0.1–1.0)
Monocytes Relative: 5 %
Neutro Abs: 2.1 10*3/uL (ref 1.7–7.7)
Neutrophils Relative %: 16 %
Platelets: 107 10*3/uL — ABNORMAL LOW (ref 150–400)
RBC: 2.59 MIL/uL — ABNORMAL LOW (ref 4.22–5.81)
RDW: 18.4 % — ABNORMAL HIGH (ref 11.5–15.5)
WBC: 12.9 10*3/uL — ABNORMAL HIGH (ref 4.0–10.5)
nRBC: 0 % (ref 0.0–0.2)

## 2018-11-25 LAB — BASIC METABOLIC PANEL
Anion gap: 4 — ABNORMAL LOW (ref 5–15)
BUN: 20 mg/dL (ref 8–23)
CO2: 25 mmol/L (ref 22–32)
Calcium: 8.7 mg/dL — ABNORMAL LOW (ref 8.9–10.3)
Chloride: 110 mmol/L (ref 98–111)
Creatinine, Ser: 1.44 mg/dL — ABNORMAL HIGH (ref 0.61–1.24)
GFR calc Af Amer: 57 mL/min — ABNORMAL LOW (ref >60)
GFR calc non Af Amer: 49 mL/min — ABNORMAL LOW (ref >60)
Glucose, Bld: 94 mg/dL (ref 70–99)
Potassium: 6 mmol/L — ABNORMAL HIGH (ref 3.5–5.1)
Sodium: 139 mmol/L (ref 135–145)

## 2018-11-25 LAB — LACTATE DEHYDROGENASE: LDH: 127 U/L (ref 98–192)

## 2018-11-25 MED ORDER — DARBEPOETIN ALFA 300 MCG/0.6ML IJ SOSY
300.0000 ug | PREFILLED_SYRINGE | Freq: Once | INTRAMUSCULAR | Status: AC
  Administered 2018-11-25: 10:00:00 300 ug via SUBCUTANEOUS
  Filled 2018-11-25: qty 0.6

## 2018-11-25 NOTE — Progress Notes (Signed)
Patient reports abdominal pain is the same with pain 3/10 on pain scale and last took pain med at 10 pm.  Appetite comes and goes with a weight loss of 5 pounds since last visit.   He reports occasional nausea and he does have a prescription

## 2018-11-25 NOTE — Progress Notes (Signed)
[facility] [place] OFFICE PROGRESS NOTE  Patient Care Team: [name], MD as PCP - General (Internal Medicine)  Cancer Staging No matching staging information was found for the patient.   Oncology History Overview Note  # 2006- CLL STAGE IV; MAY 2011- WBC- 57K;Platelets-99;Hb-12/CT Bulky LN; BMBx- 80% Invol; del 11; START Benda-Ritux x4 [finished Sep 2011];   # July 2015-Progression; Sep 2015-START ibrutinib; CT scan DEC 2015- Improvement LN; Cont Ibrutinib 19m/d; NOV 2016 CT- 1-2CM LN [mild progression compared to Dec 2015];NOV 2016- FISH peripheral blood- NO MUTATIONS/CD-38 Positive; NOV 7th- CONT IBRUTINIB 2 pills/day; CT AUG 2017- STABLE;  DEC 6th PET- Mild RP LN/ Retrocrural LN  # OFF ibrutinib [? intol]- sep 2019- Jan 16th 2020; Re-start Ibrutinib   # Jan 16th 2020- start aranesp  # DEC 2019- PAIN CONTRACT  #October 2019-bone marrow biopsy [worsening anemia]-question dyserythropoietic changes/small clone of CLL;  # [facility] HEM- NEG.  SA skin infection [Oct 2016] s/p clinda -------------------------------------------------------    DIAGNOSIS: CLL  STAGE:   IV      ;GOALS: palliative  CURRENT/MOST RECENT THERAPY : Ibrutinib.    CLL (chronic lymphoid leukemia) in relapse ([facility]    INTERVAL HISTORY:  James [name] 68y.o.  male pleasant patient above history of CLL [currently off ibrutinib] and chronic abdominal pain of unclear etiology; worsening anemia of unclear etiology/? CKD is here for follow-up.  Patient was recently evaluated by PCP for ongoing abdominal pain.  He had a CT scan abdomen pelvis did not show any acute process.  Appetite is fair.  Weight is stable.  He continues to take Percocet to 3 pills a day for his abdominal pain.  Denies any lumps or bumps.  No nausea vomiting.  Review of Systems  Constitutional: Positive for malaise/fatigue and weight loss. Negative for chills, diaphoresis and fever.  HENT: Negative  for nosebleeds and sore throat.   Eyes: Negative for double vision.  Respiratory: Negative for cough, hemoptysis, sputum production, shortness of breath and wheezing.   Cardiovascular: Negative for chest pain, palpitations, orthopnea and leg swelling.  Gastrointestinal: Positive for abdominal pain and nausea. Negative for blood in stool, constipation, diarrhea, heartburn, melena and vomiting.  Genitourinary: Negative for dysuria, frequency and urgency.  Skin: Negative.  Negative for itching and rash.  Neurological: Negative for dizziness, tingling, focal weakness, weakness and headaches.  Endo/Heme/Allergies: Does not bruise/bleed easily.  Psychiatric/Behavioral: Negative for depression. The patient is not nervous/anxious and does not have insomnia.       PAST MEDICAL HISTORY :  Past Medical History:  Diagnosis Date  . [place]ocytic [place]) ([place]   . Diabetes mellitus without complication ([place]   . Hematuria   . Hypertension     PAST SURGICAL HISTORY :   Past Surgical History:  Procedure Laterality Date  . CATARACT EXTRACTION W/ INTRAOCULAR LENS IMPLANT Bilateral   . CHOLECYSTECTOMY  1983  . COLONOSCOPY WITH PROPOFOL N/A 10/27/2017   Procedure: COLONOSCOPY WITH PROPOFOL;  Surgeon: EManya Silvas MD;  [facility];  Service: Endoscopy;  Laterality: N/A;  . ESOPHAGOGASTRODUODENOSCOPY (EGD) WITH PROPOFOL N/A 11/20/2016   Procedure: ESOPHAGOGASTRODUODENOSCOPY (EGD) WITH PROPOFOL;  Surgeon: EManya Silvas MD;  [facility];  Service: Endoscopy;  Laterality: N/A;  . ESOPHAGOGASTRODUODENOSCOPY (EGD) WITH PROPOFOL N/A 10/27/2017   Procedure: ESOPHAGOGASTRODUODENOSCOPY (EGD) WITH PROPOFOL;  Surgeon: EManya Silvas MD;  [facility];  Service: Endoscopy;  Laterality: N/A;  . LOWER EXTREMITY ANGIOGRAPHY Right 05/19/2017  Procedure: LOWER EXTREMITY ANGIOGRAPHY;  Surgeon: Algernon Huxley, MD;  Location: [place] CV LAB;  Service:  Cardiovascular;  Laterality: Right;  . LOWER EXTREMITY ANGIOGRAPHY Left 08/10/2018   Procedure: LOWER EXTREMITY ANGIOGRAPHY;  Surgeon: Algernon Huxley, MD;  Location: [place] CV LAB;  Service: Cardiovascular;  Laterality: Left;    FAMILY HISTORY :   Family History  Problem Relation Age of Onset  . Hypertension Sister     SOCIAL HISTORY:   Social History   Tobacco Use  . Smoking status: Current Every Day Smoker    Packs/day: 0.50    Years: 47.00    Pack years: 23.50    Types: Cigarettes  . Smokeless tobacco: Never Used  Substance Use Topics  . Alcohol use: Yes    Alcohol/week: 1.0 standard drinks    Types: 1 Glasses of wine per week    Comment:  almost none in last 6 months  . Drug use: No    ALLERGIES:  has No Known Allergies.  MEDICATIONS:  Current Outpatient Medications  Medication Sig Dispense Refill  . amLODipine (NORVASC) 10 MG tablet Take 1 tablet by mouth once daily 30 tablet 4  . aspirin EC 81 MG tablet Take 81 mg by mouth daily.    Marland Kitchen atorvastatin (LIPITOR) 10 MG tablet Take 1 tablet (10 mg total) by mouth daily. 30 tablet 11  . clopidogrel (PLAVIX) 75 MG tablet Take 1 tablet by mouth once daily 90 tablet 0  . IMBRUVICA 280 MG TABS TAKE 1 TABLET BY MOUTH DAILY. 28 tablet 6  . lisinopril (PRINIVIL,ZESTRIL) 30 MG tablet Take 30 mg by mouth daily.  1  . metFORMIN (GLUCOPHAGE) 1000 MG tablet Take 1,000 mg by mouth 2 (two) times daily.    . metoprolol tartrate (LOPRESSOR) 25 MG tablet Take 1 tablet by mouth twice daily 180 tablet 0  . omeprazole (PRILOSEC) 40 MG capsule Take 40 mg by mouth daily.     Marland Kitchen oxyCODONE-acetaminophen (PERCOCET/ROXICET) 5-325 MG tablet 1 pill every 8-12 hours 90 tablet 0  . sodium polystyrene (SPS) 15 GM/60ML suspension TAKE AS DIRECTED (30 GRAMS) FOR 1 DOSE    . sucralfate (CARAFATE) 1 g tablet DISSOLVE 1 TABLET IN WATER AS DIRECTED BY YOUR DOCTOR AND DRINK BY MOUTH 4 TIMES DAILY (BEFORE MEALS AND NIGHTLY)    . iron polysaccharides  (NU-IRON) 150 MG capsule Take 150 mg by mouth daily.      No current facility-administered medications for this visit.     PHYSICAL EXAMINATION: ECOG PERFORMANCE STATUS: 1 - Symptomatic but completely ambulatory  BP (!) 154/80   Pulse (!) 48   Temp 97.6 F (36.4 C)   Resp 16   Wt 158 lb 6.4 oz (71.8 kg)   BMI 24.08 kg/m   Filed Weights   11/25/18 0856  Weight: 158 lb 6.4 oz (71.8 kg)    Physical Exam  Constitutional: He is oriented to person, place, and time and well-developed, well-nourished, and in no distress.  He is alone.  Appears pale.  HENT:  Head: Normocephalic and atraumatic.  Mouth/Throat: Oropharynx is clear and moist. No oropharyngeal exudate.  Eyes: Pupils are equal, round, and reactive to light.  Neck: Normal range of motion. Neck supple.  Cardiovascular: Normal rate and regular rhythm.  Pulmonary/Chest: No respiratory distress. He has no wheezes.  Decreased air entry bil.   Abdominal: Soft. Bowel sounds are normal. He exhibits no distension and no mass. There is no abdominal tenderness. There is no rebound and  no guarding.  Musculoskeletal: Normal range of motion.        General: No tenderness or edema.  Neurological: He is alert and oriented to person, place, and time.  Skin: Skin is warm. There is pallor.  Multiple bruises noted in bilateral upper extremity.  Psychiatric: Affect normal.      LABORATORY DATA:  I have reviewed the data as listed    Component Value Date/Time   NA 139 11/25/2018 0844   NA 142 05/03/2014 1139   K 6.0 (H) 11/25/2018 0844   K 4.7 05/03/2014 1139   CL 110 11/25/2018 0844   CL 110 (H) 05/03/2014 1139   CO2 25 11/25/2018 0844   CO2 24 05/03/2014 1139   GLUCOSE 94 11/25/2018 0844   GLUCOSE 98 05/03/2014 1139   BUN 20 11/25/2018 0844   BUN 20 (H) 05/03/2014 1139   CREATININE 1.44 (H) 11/25/2018 0844   CREATININE 1.22 08/09/2014 1122   CALCIUM 8.7 (L) 11/25/2018 0844   CALCIUM 8.6 05/03/2014 1139   PROT 6.9  08/20/2018 1009   PROT 7.5 05/03/2014 1139   ALBUMIN 3.7 08/20/2018 1009   ALBUMIN 4.1 05/03/2014 1139   AST 12 (L) 08/20/2018 1009   AST 15 05/03/2014 1139   ALT 9 08/20/2018 1009   ALT 26 05/03/2014 1139   ALKPHOS 83 08/20/2018 1009   ALKPHOS 94 05/03/2014 1139   BILITOT 0.4 08/20/2018 1009   BILITOT 0.5 05/03/2014 1139   GFRNONAA 49 (L) 11/25/2018 0844   GFRNONAA >60 08/09/2014 1122   GFRAA 57 (L) 11/25/2018 0844   GFRAA >60 08/09/2014 1122    No results found for: SPEP, UPEP  Lab Results  Component Value Date   WBC 12.9 (H) 11/25/2018   NEUTROABS 2.1 11/25/2018   HGB 8.6 (L) 11/25/2018   HCT 28.2 (L) 11/25/2018   MCV 108.9 (H) 11/25/2018   PLT 107 (L) 11/25/2018      Chemistry      Component Value Date/Time   NA 139 11/25/2018 0844   NA 142 05/03/2014 1139   K 6.0 (H) 11/25/2018 0844   K 4.7 05/03/2014 1139   CL 110 11/25/2018 0844   CL 110 (H) 05/03/2014 1139   CO2 25 11/25/2018 0844   CO2 24 05/03/2014 1139   BUN 20 11/25/2018 0844   BUN 20 (H) 05/03/2014 1139   CREATININE 1.44 (H) 11/25/2018 0844   CREATININE 1.22 08/09/2014 1122      Component Value Date/Time   CALCIUM 8.7 (L) 11/25/2018 0844   CALCIUM 8.6 05/03/2014 1139   ALKPHOS 83 08/20/2018 1009   ALKPHOS 94 05/03/2014 1139   AST 12 (L) 08/20/2018 1009   AST 15 05/03/2014 1139   ALT 9 08/20/2018 1009   ALT 26 05/03/2014 1139   BILITOT 0.4 08/20/2018 1009   BILITOT 0.5 05/03/2014 1139       RADIOGRAPHIC STUDIES: I have personally reviewed the radiological images as listed and agreed with the findings in the report. No results found.   ASSESSMENT & PLAN:  CLL (chronic lymphoid leukemia) in relapse ([place]) # CLL/SLL- relapsed currently on lower dose ibrutinib [sec to intolerance in sep 2019]; July 2020- CT Ab/Pelvis- CT scan shows no progressive/worsening lymphadenopathy.  Ibrutinib 280 mg once a day.  # Anemia hemoglobin ~8.3. CLL/CKD; Continue to 300 mcg q 2 weeks.   #Easy  bruising/secondary to ibrutinib/Plavix platelets 107.  Monitor for now  # Abdominal pain/dyspepsia/unclear etiology;stable;  Continue Percocet for now. S/p GI eval at [facility]; awaiting hepatology  evaluation; [place]- normal; July 2020- CT no acute process.   # DISPOSITION: add iron studies/ferritin today # aranesp today; # in 3 weeks- cbc/ possible aranesp.   # in 6 weeks- MD-cbc/bmp/LDH-possible aranesp-Dr.B    No orders of the defined types were placed in this encounter.  All questions were answered. The patient knows to call the clinic with any problems, questions or concerns.      Cammie Sickle, MD 11/25/2018 11:22 AM

## 2018-11-25 NOTE — Assessment & Plan Note (Addendum)
#   CLL/SLL- relapsed currently on lower dose ibrutinib [sec to intolerance in sep 2019]; July 2020- CT Ab/Pelvis- CT scan shows no progressive/worsening lymphadenopathy.  Ibrutinib 280 mg once a day.  # Anemia hemoglobin ~8.3. CLL/CKD; Continue to 300 mcg q 2 weeks.   #Easy bruising/secondary to ibrutinib/Plavix platelets 107.  Monitor for now  # Abdominal pain/dyspepsia/unclear etiology;stable;  Continue Percocet for now. S/p GI eval at [facility]; awaiting hepatology evaluation; [place]- normal; July 2020- CT no acute process.   # DISPOSITION: add iron studies/ferritin today # aranesp today; # in 3 weeks- cbc/ possible aranesp.   # in 6 weeks- MD-cbc/bmp/LDH-possible aranesp-Dr.B

## 2018-11-26 ENCOUNTER — Telehealth: Payer: Self-pay | Admitting: Pharmacy Technician

## 2018-11-26 MED FILL — IMBRUVICA 280 MG TAB: 280 | 28 days supply | Qty: 28 | Fill #0

## 2018-11-26 NOTE — Telephone Encounter (Signed)
Oral Oncology Patient Advocate Encounter  Was successful in securing patient a $8000 grant from [employer] to provide copayment coverage for Imbruvica.  This will keep the out of pocket expense at $0.     [employer] ID: [id number]  I have spoken with the patient.   The billing information is as follows and has been shared with [place].    RxBin: [id number] PCN: [id number] Member ID: [id number] Group ID: [id number] Dates of Eligibility: 11/17/2018 through 11/16/2019  [name] Phone [phone] Fax [phone] 11/26/2018 8:23 AM

## 2018-12-14 ENCOUNTER — Other Ambulatory Visit: Payer: Self-pay | Admitting: *Deleted

## 2018-12-14 DIAGNOSIS — C8300 Small cell B-cell lymphoma, unspecified site: Secondary | ICD-10-CM

## 2018-12-14 DIAGNOSIS — C9192 Lymphoid leukemia, unspecified, in relapse: Secondary | ICD-10-CM

## 2018-12-14 DIAGNOSIS — C9112 Chronic lymphocytic leukemia of B-cell type in relapse: Secondary | ICD-10-CM

## 2018-12-14 MED ORDER — OXYCODONE-ACETAMINOPHEN 5-325 MG PO TABS: ORAL_TABLET | ORAL | 0 refills | Status: DC

## 2018-12-15 ENCOUNTER — Other Ambulatory Visit: Payer: Self-pay

## 2018-12-15 ENCOUNTER — Other Ambulatory Visit: Payer: Self-pay | Admitting: *Deleted

## 2018-12-15 DIAGNOSIS — C9112 Chronic lymphocytic leukemia of B-cell type in relapse: Secondary | ICD-10-CM

## 2018-12-15 DIAGNOSIS — D631 Anemia in chronic kidney disease: Secondary | ICD-10-CM

## 2018-12-15 DIAGNOSIS — N183 Chronic kidney disease, stage 3 unspecified: Secondary | ICD-10-CM

## 2018-12-15 DIAGNOSIS — C9192 Lymphoid leukemia, unspecified, in relapse: Secondary | ICD-10-CM

## 2018-12-16 ENCOUNTER — Inpatient Hospital Stay: Payer: Medicare Other

## 2018-12-16 ENCOUNTER — Other Ambulatory Visit: Payer: Self-pay

## 2018-12-16 ENCOUNTER — Inpatient Hospital Stay: Payer: Medicare Other | Attending: Internal Medicine

## 2018-12-16 VITALS — BP 127/71 | HR 54

## 2018-12-16 DIAGNOSIS — N183 Chronic kidney disease, stage 3 unspecified: Secondary | ICD-10-CM

## 2018-12-16 DIAGNOSIS — N189 Chronic kidney disease, unspecified: Secondary | ICD-10-CM | POA: Insufficient documentation

## 2018-12-16 DIAGNOSIS — D631 Anemia in chronic kidney disease: Secondary | ICD-10-CM | POA: Insufficient documentation

## 2018-12-16 DIAGNOSIS — C9112 Chronic lymphocytic leukemia of B-cell type in relapse: Secondary | ICD-10-CM

## 2018-12-16 DIAGNOSIS — C9192 Lymphoid leukemia, unspecified, in relapse: Secondary | ICD-10-CM

## 2018-12-16 LAB — CBC WITH DIFFERENTIAL/PLATELET
Abs Immature Granulocytes: 0 10*3/uL (ref 0.00–0.07)
Basophils Absolute: 0.1 10*3/uL (ref 0.0–0.1)
Basophils Relative: 1 %
Eosinophils Absolute: 0.3 10*3/uL (ref 0.0–0.5)
Eosinophils Relative: 2 %
HCT: 28.2 % — ABNORMAL LOW (ref 39.0–52.0)
Hemoglobin: 8.8 g/dL — ABNORMAL LOW (ref 13.0–17.0)
Lymphocytes Relative: 83 %
Lymphs Abs: 11.5 10*3/uL — ABNORMAL HIGH (ref 0.7–4.0)
MCH: 33.8 pg (ref 26.0–34.0)
MCHC: 31.2 g/dL (ref 30.0–36.0)
MCV: 108.5 fL — ABNORMAL HIGH (ref 80.0–100.0)
Monocytes Absolute: 0.3 10*3/uL (ref 0.1–1.0)
Monocytes Relative: 2 %
Neutro Abs: 1.7 10*3/uL (ref 1.7–7.7)
Neutrophils Relative %: 12 %
Platelets: 77 10*3/uL — ABNORMAL LOW (ref 150–400)
RBC: 2.6 MIL/uL — ABNORMAL LOW (ref 4.22–5.81)
RDW: 16.8 % — ABNORMAL HIGH (ref 11.5–15.5)
Smear Review: DECREASED
WBC: 13.8 10*3/uL — ABNORMAL HIGH (ref 4.0–10.5)
nRBC: 0 % (ref 0.0–0.2)

## 2018-12-16 MED ORDER — DARBEPOETIN ALFA 300 MCG/0.6ML IJ SOSY
300.0000 ug | PREFILLED_SYRINGE | Freq: Once | INTRAMUSCULAR | Status: AC
  Administered 2018-12-16: 10:00:00 300 ug via SUBCUTANEOUS
  Filled 2018-12-16: qty 0.6

## 2018-12-24 MED FILL — IMBRUVICA 280 MG TAB: 280 | 28 days supply | Qty: 28 | Fill #1

## 2018-12-28 ENCOUNTER — Other Ambulatory Visit: Payer: Self-pay

## 2018-12-28 ENCOUNTER — Inpatient Hospital Stay
Admission: EM | Admit: 2018-12-28 | Discharge: 2018-12-30 | DRG: 683 | Disposition: A | Discharge status: Home / Self Care | Payer: Medicare Other | Attending: Internal Medicine | Admitting: Internal Medicine

## 2018-12-28 ENCOUNTER — Emergency Department: Payer: Medicare Other

## 2018-12-28 DIAGNOSIS — K219 Gastro-esophageal reflux disease without esophagitis: Secondary | ICD-10-CM | POA: Diagnosis present

## 2018-12-28 DIAGNOSIS — Z8249 Family history of ischemic heart disease and other diseases of the circulatory system: Secondary | ICD-10-CM

## 2018-12-28 DIAGNOSIS — D72829 Elevated white blood cell count, unspecified: Secondary | ICD-10-CM | POA: Diagnosis present

## 2018-12-28 DIAGNOSIS — E875 Hyperkalemia: Secondary | ICD-10-CM | POA: Diagnosis present

## 2018-12-28 DIAGNOSIS — N183 Chronic kidney disease, stage 3 (moderate): Secondary | ICD-10-CM | POA: Diagnosis present

## 2018-12-28 DIAGNOSIS — D539 Nutritional anemia, unspecified: Secondary | ICD-10-CM | POA: Diagnosis present

## 2018-12-28 DIAGNOSIS — E538 Deficiency of other specified B group vitamins: Secondary | ICD-10-CM | POA: Diagnosis present

## 2018-12-28 DIAGNOSIS — E1122 Type 2 diabetes mellitus with diabetic chronic kidney disease: Secondary | ICD-10-CM | POA: Diagnosis present

## 2018-12-28 DIAGNOSIS — F17210 Nicotine dependence, cigarettes, uncomplicated: Secondary | ICD-10-CM | POA: Diagnosis present

## 2018-12-28 DIAGNOSIS — R0602 Shortness of breath: Secondary | ICD-10-CM | POA: Diagnosis not present

## 2018-12-28 DIAGNOSIS — N179 Acute kidney failure, unspecified: Principal | ICD-10-CM

## 2018-12-28 DIAGNOSIS — R634 Abnormal weight loss: Secondary | ICD-10-CM | POA: Diagnosis present

## 2018-12-28 DIAGNOSIS — D631 Anemia in chronic kidney disease: Secondary | ICD-10-CM

## 2018-12-28 DIAGNOSIS — Z6822 Body mass index (BMI) 22.0-22.9, adult: Secondary | ICD-10-CM

## 2018-12-28 DIAGNOSIS — G8929 Other chronic pain: Secondary | ICD-10-CM | POA: Diagnosis present

## 2018-12-28 DIAGNOSIS — J449 Chronic obstructive pulmonary disease, unspecified: Secondary | ICD-10-CM | POA: Diagnosis present

## 2018-12-28 DIAGNOSIS — Z66 Do not resuscitate: Secondary | ICD-10-CM | POA: Diagnosis present

## 2018-12-28 DIAGNOSIS — Z20828 Contact with and (suspected) exposure to other viral communicable diseases: Secondary | ICD-10-CM | POA: Diagnosis present

## 2018-12-28 DIAGNOSIS — I129 Hypertensive chronic kidney disease with stage 1 through stage 4 chronic kidney disease, or unspecified chronic kidney disease: Secondary | ICD-10-CM | POA: Diagnosis present

## 2018-12-28 DIAGNOSIS — D649 Anemia, unspecified: Secondary | ICD-10-CM

## 2018-12-28 DIAGNOSIS — R109 Unspecified abdominal pain: Secondary | ICD-10-CM | POA: Diagnosis present

## 2018-12-28 DIAGNOSIS — C9110 Chronic lymphocytic leukemia of B-cell type not having achieved remission: Secondary | ICD-10-CM | POA: Diagnosis present

## 2018-12-28 LAB — GLUCOSE, CAPILLARY
Glucose-Capillary: 83 mg/dL (ref 70–99)
Glucose-Capillary: 117 mg/dL — ABNORMAL HIGH (ref 70–99)

## 2018-12-28 LAB — CBC
HCT: 33 % — ABNORMAL LOW (ref 39.0–52.0)
Hemoglobin: 10.1 g/dL — ABNORMAL LOW (ref 13.0–17.0)
MCH: 33.7 pg (ref 26.0–34.0)
MCHC: 30.6 g/dL (ref 30.0–36.0)
MCV: 110 fL — ABNORMAL HIGH (ref 80.0–100.0)
Platelets: 165 10*3/uL (ref 150–400)
RBC: 3 MIL/uL — ABNORMAL LOW (ref 4.22–5.81)
RDW: 16.2 % — ABNORMAL HIGH (ref 11.5–15.5)
WBC: 27.9 10*3/uL — ABNORMAL HIGH (ref 4.0–10.5)
nRBC: 0 % (ref 0.0–0.2)

## 2018-12-28 LAB — LACTIC ACID, PLASMA
Lactic Acid, Venous: 1.1 mmol/L (ref 0.5–1.9)
Lactic Acid, Venous: 1.3 mmol/L (ref 0.5–1.9)

## 2018-12-28 LAB — BASIC METABOLIC PANEL
Anion gap: 9 (ref 5–15)
BUN: 56 mg/dL — ABNORMAL HIGH (ref 8–23)
CO2: 17 mmol/L — ABNORMAL LOW (ref 22–32)
Calcium: 9.1 mg/dL (ref 8.9–10.3)
Chloride: 115 mmol/L — ABNORMAL HIGH (ref 98–111)
Creatinine, Ser: 2.28 mg/dL — ABNORMAL HIGH (ref 0.61–1.24)
GFR calc Af Amer: 33 mL/min — ABNORMAL LOW (ref >60)
GFR calc non Af Amer: 28 mL/min — ABNORMAL LOW (ref >60)
Glucose, Bld: 110 mg/dL — ABNORMAL HIGH (ref 70–99)
Potassium: 5.9 mmol/L — ABNORMAL HIGH (ref 3.5–5.1)
Sodium: 141 mmol/L (ref 135–145)

## 2018-12-28 LAB — HEMOGLOBIN A1C
Hgb A1c MFr Bld: 5 % (ref 4.8–5.6)
Mean Plasma Glucose: 96.8 mg/dL

## 2018-12-28 LAB — SARS CORONAVIRUS 2 BY RT PCR (HOSPITAL ORDER, PERFORMED IN ~~LOC~~ HOSPITAL LAB): SARS Coronavirus 2: NEGATIVE

## 2018-12-28 MED ORDER — CALCIUM GLUCONATE-NACL 1-0.675 GM/50ML-% IV SOLN
1.0000 g | Freq: Once | INTRAVENOUS | Status: AC
  Administered 2018-12-28: 16:00:00 1000 mg via INTRAVENOUS
  Filled 2018-12-28: qty 50

## 2018-12-28 MED ORDER — FLUTICASONE FUROATE-VILANTEROL 100-25 MCG/INH IN AEPB
1.0000 | INHALATION_SPRAY | Freq: Every day | RESPIRATORY_TRACT | Status: DC
  Administered 2018-12-29 – 2018-12-30 (×2): 1 via RESPIRATORY_TRACT
  Filled 2018-12-28: qty 28

## 2018-12-28 MED ORDER — SODIUM CHLORIDE 0.9 % IV SOLN
1000.0000 mL | Freq: Once | INTRAVENOUS | Status: AC
  Administered 2018-12-28: 15:00:00 1000 mL via INTRAVENOUS

## 2018-12-28 MED ORDER — INSULIN ASPART 100 UNIT/ML ~~LOC~~ SOLN
0.0000 [IU] | Freq: Three times a day (TID) | SUBCUTANEOUS | Status: DC
  Administered 2018-12-30: 12:00:00 1 [IU] via SUBCUTANEOUS
  Filled 2018-12-28: qty 1

## 2018-12-28 MED ORDER — ATORVASTATIN CALCIUM 20 MG PO TABS
10.0000 mg | ORAL_TABLET | Freq: Every day | ORAL | Status: DC
  Administered 2018-12-28 – 2018-12-29 (×2): 10 mg via ORAL
  Filled 2018-12-28 (×2): qty 1

## 2018-12-28 MED ORDER — ASPIRIN EC 81 MG PO TBEC
81.0000 mg | DELAYED_RELEASE_TABLET | Freq: Every day | ORAL | Status: DC
  Administered 2018-12-29 – 2018-12-30 (×2): 81 mg via ORAL
  Filled 2018-12-28 (×2): qty 1

## 2018-12-28 MED ORDER — PANTOPRAZOLE SODIUM 40 MG PO TBEC
40.0000 mg | DELAYED_RELEASE_TABLET | Freq: Every day | ORAL | Status: DC
  Administered 2018-12-29 – 2018-12-30 (×2): 40 mg via ORAL
  Filled 2018-12-28 (×2): qty 1

## 2018-12-28 MED ORDER — PATIROMER SORBITEX CALCIUM 8.4 G PO PACK
8.4000 g | PACK | ORAL | Status: AC
  Administered 2018-12-28: 16:00:00 8.4 g via ORAL
  Filled 2018-12-28: qty 1

## 2018-12-28 MED ORDER — METFORMIN HCL 500 MG PO TABS: 1000.0000 mg | ORAL_TABLET | Freq: Two times a day (BID) | ORAL | Status: DC

## 2018-12-28 MED ORDER — NICOTINE 21 MG/24HR TD PT24
21.0000 mg | MEDICATED_PATCH | Freq: Every day | TRANSDERMAL | Status: DC
  Administered 2018-12-28 – 2018-12-30 (×3): 21 mg via TRANSDERMAL
  Filled 2018-12-28 (×3): qty 1

## 2018-12-28 MED ORDER — IBRUTINIB 280 MG PO TABS
280.0000 mg | ORAL_TABLET | Freq: Every day | ORAL | Status: DC
  Administered 2018-12-29 – 2018-12-30 (×2): 280 mg via ORAL
  Filled 2018-12-28 (×3): qty 1

## 2018-12-28 MED ORDER — DOCUSATE SODIUM 100 MG PO CAPS: 100.0000 mg | ORAL_CAPSULE | Freq: Two times a day (BID) | ORAL | Status: DC | PRN

## 2018-12-28 MED ORDER — SODIUM CHLORIDE 0.9% FLUSH: 3.0000 mL | Freq: Once | INTRAVENOUS | Status: DC

## 2018-12-28 MED ORDER — SUCRALFATE 1 G PO TABS
1.0000 g | ORAL_TABLET | Freq: Three times a day (TID) | ORAL | Status: DC
  Administered 2018-12-28 – 2018-12-30 (×7): 1 g via ORAL
  Filled 2018-12-28 (×7): qty 1

## 2018-12-28 MED ORDER — HEPARIN SODIUM (PORCINE) 5000 UNIT/ML IJ SOLN
5000.0000 [IU] | Freq: Three times a day (TID) | INTRAMUSCULAR | Status: DC
  Administered 2018-12-28 – 2018-12-30 (×5): 5000 [IU] via SUBCUTANEOUS
  Filled 2018-12-28 (×5): qty 1

## 2018-12-28 MED ORDER — IPRATROPIUM-ALBUTEROL 0.5-2.5 (3) MG/3ML IN SOLN
3.0000 mL | Freq: Four times a day (QID) | RESPIRATORY_TRACT | Status: DC
  Administered 2018-12-28 – 2018-12-29 (×6): 3 mL via RESPIRATORY_TRACT
  Filled 2018-12-28 (×6): qty 3

## 2018-12-28 MED ORDER — SODIUM CHLORIDE 0.9 % IV SOLN
INTRAVENOUS | Status: DC
  Administered 2018-12-28 – 2018-12-29 (×3): via INTRAVENOUS

## 2018-12-28 MED ORDER — CLOPIDOGREL BISULFATE 75 MG PO TABS
75.0000 mg | ORAL_TABLET | Freq: Every day | ORAL | Status: DC
  Administered 2018-12-29 – 2018-12-30 (×2): 75 mg via ORAL
  Filled 2018-12-28 (×2): qty 1

## 2018-12-28 MED ORDER — INSULIN ASPART 100 UNIT/ML ~~LOC~~ SOLN: 0.0000 [IU] | Freq: Every day | SUBCUTANEOUS | Status: DC

## 2018-12-28 MED ORDER — OXYCODONE-ACETAMINOPHEN 5-325 MG PO TABS
1.0000 | ORAL_TABLET | Freq: Four times a day (QID) | ORAL | Status: DC | PRN
  Administered 2018-12-29 – 2018-12-30 (×4): 1 via ORAL
  Filled 2018-12-28 (×4): qty 1

## 2018-12-28 MED ORDER — IPRATROPIUM-ALBUTEROL 0.5-2.5 (3) MG/3ML IN SOLN
3.0000 mL | Freq: Once | RESPIRATORY_TRACT | Status: AC
  Administered 2018-12-28: 14:00:00 3 mL via RESPIRATORY_TRACT
  Filled 2018-12-28: qty 3

## 2018-12-28 MED ORDER — POLYSACCHARIDE IRON COMPLEX 150 MG PO CAPS
150.0000 mg | ORAL_CAPSULE | Freq: Every day | ORAL | Status: DC
  Administered 2018-12-28 – 2018-12-30 (×3): 150 mg via ORAL
  Filled 2018-12-28 (×3): qty 1

## 2018-12-28 MED ORDER — ALBUTEROL SULFATE (2.5 MG/3ML) 0.083% IN NEBU
2.5000 mg | INHALATION_SOLUTION | RESPIRATORY_TRACT | Status: AC
  Filled 2018-12-28 (×3): qty 3

## 2018-12-28 NOTE — ED Notes (Signed)
ED TO INPATIENT HANDOFF REPORT  ED Nurse Name and Phone #: [phone]  S Name/Age/Gender James [name] 68 y.o. male Room/Bed: ED07A/ED07A  Code Status   Code Status: Prior  Home/SNF/Other Home Patient oriented to: situation Is this baseline? Yes   Triage Complete: Triage complete  Chief Complaint sob  Triage Note Pt c/o increased SOB over the past week,. States he get out of breath walking to the BR at home. Pt is a/ox4 on arrival, in NAD. Pt states he does have leukemia and is on medication for that.    Allergies No Known Allergies  Level of Care/Admitting Diagnosis ED Disposition    ED Disposition Condition [facility] Area: [place] [100120]  Level of Care: Med-Surg [16]  Covid Evaluation: Confirmed COVID Negative  Diagnosis: SOB (shortness of breath) [092330]  Admitting Physician: Vaughan Basta [phone]  Attending Physician: Vaughan Basta [phone]  PT Class (Do Not Modify): Observation [104]  PT Acc Code (Do Not Modify): Observation [10022]       B Medical/Surgery History Past Medical History:  Diagnosis Date  . CLL (chronic lymphocytic leukemia) ([place])   . Diabetes mellitus without complication ([place])   . Hematuria   . Hypertension    Past Surgical History:  Procedure Laterality Date  . CATARACT EXTRACTION W/ INTRAOCULAR LENS IMPLANT Bilateral   . CHOLECYSTECTOMY  1983  . COLONOSCOPY WITH PROPOFOL N/A 10/27/2017   Procedure: COLONOSCOPY WITH PROPOFOL;  Surgeon: Manya Silvas, MD;  Location: [facility] ENDOSCOPY;  Service: Endoscopy;  Laterality: N/A;  . ESOPHAGOGASTRODUODENOSCOPY (EGD) WITH PROPOFOL N/A 11/20/2016   Procedure: ESOPHAGOGASTRODUODENOSCOPY (EGD) WITH PROPOFOL;  Surgeon: Manya Silvas, MD;  Location: [facility] ENDOSCOPY;  Service: Endoscopy;  Laterality: N/A;  . ESOPHAGOGASTRODUODENOSCOPY (EGD) WITH PROPOFOL N/A 10/27/2017   Procedure: ESOPHAGOGASTRODUODENOSCOPY (EGD) WITH PROPOFOL;  Surgeon:  Manya Silvas, MD;  Location: [facility] ENDOSCOPY;  Service: Endoscopy;  Laterality: N/A;  . LOWER EXTREMITY ANGIOGRAPHY Right 05/19/2017   Procedure: LOWER EXTREMITY ANGIOGRAPHY;  Surgeon: Algernon Huxley, MD;  Location: [place] CV LAB;  Service: Cardiovascular;  Laterality: Right;  . LOWER EXTREMITY ANGIOGRAPHY Left 08/10/2018   Procedure: LOWER EXTREMITY ANGIOGRAPHY;  Surgeon: Algernon Huxley, MD;  Location: [place] CV LAB;  Service: Cardiovascular;  Laterality: Left;     A IV Location/Drains/Wounds Patient Lines/Drains/Airways Status   Active Line/Drains/Airways    Name:   Placement date:   Placement time:   Site:   Days:   Peripheral IV 12/28/18 Right Antecubital   12/28/18    1300    Antecubital   less than 1   Peripheral IV 12/28/18 Left Antecubital   12/28/18    1440    Antecubital   less than 1   Sheath 08/10/18 Right   08/10/18    1005    -   140   Sheath 08/10/18 Left   08/10/18    1042    -   140          Intake/Output Last 24 hours No intake or output data in the 24 hours ending 12/28/18 1802  Labs/Imaging Results for orders placed or performed during the hospital encounter of 12/28/18 (from the past 48 hour(s))  Basic metabolic panel     Status: Abnormal   Collection Time: 12/28/18 10:07 AM  Result Value Ref Range   Sodium 141 135 - 145 mmol/L   Potassium 5.9 (H) 3.5 - 5.1 mmol/L   Chloride 115 (H) 98 - 111 mmol/L  CO2 17 (L) 22 - 32 mmol/L   Glucose, Bld 110 (H) 70 - 99 mg/dL   BUN 56 (H) 8 - 23 mg/dL   Creatinine, Ser 2.28 (H) 0.61 - 1.24 mg/dL   Calcium 9.1 8.9 - 10.3 mg/dL   GFR calc non Af Amer 28 (L) >60 mL/min   GFR calc Af Amer 33 (L) >60 mL/min   Anion gap 9 5 - 15    Comment: Performed at [facility], [address] [place], [place] [address]  CBC     Status: Abnormal   Collection Time: 12/28/18 10:07 AM  Result Value Ref Range   WBC 27.9 (H) 4.0 - 10.5 K/uL   RBC 3.00 (L) 4.22 - 5.81 MIL/uL   Hemoglobin 10.1 (L) 13.0 - 17.0 g/dL    HCT 33.0 (L) 39.0 - 52.0 %   MCV 110.0 (H) 80.0 - 100.0 fL   MCH 33.7 26.0 - 34.0 pg   MCHC 30.6 30.0 - 36.0 g/dL   RDW 16.2 (H) 11.5 - 15.5 %   Platelets 165 150 - 400 K/uL   nRBC 0.0 0.0 - 0.2 %    Comment: Performed at [facility], [address] [place], [place] [address]  SARS Coronavirus 2 [facility] order, Performed in [facility] hospital lab) Nasopharyngeal Nasopharyngeal Swab     Status: None   Collection Time: 12/28/18 12:29 PM   Specimen: Nasopharyngeal Swab  Result Value Ref Range   SARS Coronavirus 2 NEGATIVE NEGATIVE    Comment: (NOTE) If result is NEGATIVE SARS-CoV-2 target nucleic acids are NOT DETECTED. The SARS-CoV-2 RNA is generally detectable in upper and lower  respiratory specimens during the acute phase of infection. The lowest  concentration of SARS-CoV-2 viral copies this assay can detect is 250  copies / mL. A negative result does not preclude SARS-CoV-2 infection  and should not be used as the sole basis for treatment or other  patient management decisions.  A negative result may occur with  improper specimen collection / handling, submission of specimen other  than nasopharyngeal swab, presence of viral mutation(s) within the  areas targeted by this assay, and inadequate number of viral copies  (<250 copies / mL). A negative result must be combined with clinical  observations, patient history, and epidemiological information. If result is POSITIVE SARS-CoV-2 target nucleic acids are DETECTED. The SARS-CoV-2 RNA is generally detectable in upper and lower  respiratory specimens dur ing the acute phase of infection.  Positive  results are indicative of active infection with SARS-CoV-2.  Clinical  correlation with patient history and other diagnostic information is  necessary to determine patient infection status.  Positive results do  not rule out bacterial infection or co-infection with other viruses. If result is PRESUMPTIVE  POSTIVE SARS-CoV-2 nucleic acids MAY BE PRESENT.   A presumptive positive result was obtained on the submitted specimen  and confirmed on repeat testing.  While 2019 novel coronavirus  (SARS-CoV-2) nucleic acids may be present in the submitted sample  additional confirmatory testing may be necessary for epidemiological  and / or clinical management purposes  to differentiate between  SARS-CoV-2 and other Sarbecovirus currently known to infect humans.  If clinically indicated additional testing with an alternate test  methodology [phone]) is advised. The SARS-CoV-2 RNA is generally  detectable in upper and lower respiratory sp ecimens during the acute  phase of infection. The expected result is Negative. Fact Sheet for Patients:  StrictlyIdeas.no Fact Sheet for Healthcare Providers: BankingDealers.co.za This  test is not yet approved or cleared by the [place] and has been authorized for detection and/or diagnosis of SARS-CoV-2 by FDA under an Emergency Use Authorization (EUA).  This EUA will remain in effect (meaning this test can be used) for the duration of the COVID-19 declaration under Section 564(b)(1) of the Act, 21 U.S.C. section 360bbb-3(b)(1), unless the authorization is terminated or revoked sooner. Performed at [facility], [address] [place], [place] [address]   Lactic acid, plasma     Status: None   Collection Time: 12/28/18  2:24 PM  Result Value Ref Range   Lactic Acid, Venous 1.1 0.5 - 1.9 mmol/L    Comment: Performed at [facility], [address] [place], [place] [address]  Glucose, capillary     Status: None   Collection Time: 12/28/18  5:41 PM  Result Value Ref Range   Glucose-Capillary 83 70 - 99 mg/dL   Dg Chest 2 View  Result Date: 12/28/2018 CLINICAL DATA:  Shortness of breath EXAM: CHEST - 2 VIEW COMPARISON:  CT chest, 01/20/2018 FINDINGS: The heart size and mediastinal  contours are within normal limits. Both lungs are clear. Disc degenerative disease of the lower thoracic spine. IMPRESSION: No acute abnormality of the lungs. Electronically Signed   By: Eddie Candle M.D.   On: 12/28/2018 11:29    Pending Labs Unresulted Labs (From admission, onward)    Start     Ordered   12/28/18 1711  Lactic acid, plasma  Once,   STAT     12/28/18 1710   12/28/18 1520  Hemoglobin A1c  Once,   STAT    Comments: To assess prior glycemic control    12/28/18 1519   12/28/18 1417  Blood culture (routine x 2)  BLOOD CULTURE X 2,   STAT     12/28/18 1416   Signed and Held  HIV antibody (Routine Testing)  Once,   R     Signed and Held   Signed and Held  Basic metabolic panel  Tomorrow morning,   R     Signed and Held   Signed and Held  CBC  Tomorrow morning,   R     Signed and Held   Signed and Held  CBC  (heparin)  Once,   R    Comments: Baseline for heparin therapy IF NOT ALREADY DRAWN.  Notify MD if PLT < 100 K.    Signed and Held   Signed and Held  Creatinine, serum  (heparin)  Once,   R    Comments: Baseline for heparin therapy IF NOT ALREADY DRAWN.    Signed and Held          Vitals/Pain Today's Vitals   12/28/18 1026 12/28/18 1222 12/28/18 1413 12/28/18 1735  BP:  114/75 107/68 122/70  Pulse:  63 70 61  Resp:  18 18 18   Temp:      TempSrc:      SpO2:  100% 100% 100%  Weight: 65.8 kg     Height: 5\' 8"  (1.727 m)     PainSc: 3  4  4  4      Isolation Precautions No active isolations  Medications Medications  sodium chloride flush (NS) 0.9 % injection 3 mL (3 mLs Intravenous Not Given 12/28/18 1445)  ipratropium-albuterol (DUONEB) 0.5-2.5 (3) MG/3ML nebulizer solution 3 mL (3 mLs Nebulization Given 12/28/18 1702)  0.9 %  sodium chloride infusion ( Intravenous New Bag/Given 12/28/18 1704)  albuterol (PROVENTIL) (  2.5 MG/3ML) 0.083% nebulizer solution 2.5 mg (2.5 mg Nebulization Not Given 12/28/18 1554)  insulin aspart (novoLOG) injection 0-9 Units (0  Units Subcutaneous Not Given 12/28/18 1742)  insulin aspart (novoLOG) injection 0-5 Units (has no administration in time range)  fluticasone furoate-vilanterol (BREO ELLIPTA) 100-25 MCG/INH 1 puff (has no administration in time range)  ipratropium-albuterol (DUONEB) 0.5-2.5 (3) MG/3ML nebulizer solution 3 mL (3 mLs Nebulization Given 12/28/18 1331)  0.9 %  sodium chloride infusion (0 mLs Intravenous Stopped 12/28/18 1658)  patiromer Daryll Drown) packet 8.4 g (8.4 g Oral Given 12/28/18 1559)  calcium gluconate 1 g/ 50 mL sodium chloride IVPB (0 mg Intravenous Stopped 12/28/18 1708)    Mobility walks Low fall risk   Focused Assessments Cardiac Assessment Handoff:    Lab Results  Component Value Date   TROPONINI < 0.02 11/16/2013   No results found for: DDIMER Does the Patient currently have chest pain? No     R Recommendations: See Admitting Provider Note  Report given to:   Additional Notes:

## 2018-12-28 NOTE — ED Triage Notes (Signed)
Pt c/o increased SOB over the past week,. States he get out of breath walking to the BR at home. Pt is a/ox4 on arrival, in NAD. Pt states he does have leukemia and is on medication for that.

## 2018-12-28 NOTE — ED Notes (Signed)
Report to [facility], patient to floor.

## 2018-12-28 NOTE — Progress Notes (Signed)
Family Meeting Note  Advance Directive:yes  Today a meeting took place with the Patient.  The following clinical team members were present during this meeting:MD  The following were discussed:Patient's diagnosis: COPD, shortness of breath, CLL, active smoking, diabetes, Patient's progosis: Unable to determine and Goals for treatment: DNR  Additional follow-up to be provided: PMD  Time spent during discussion:20 minutes  Vaughan Basta, MD

## 2018-12-28 NOTE — H&P (Signed)
[place] [facility] [place] NAME: James [name]    MR#:  [id number]  DATE OF BIRTH:  1950-09-06  DATE OF ADMISSION:  12/28/2018  PRIMARY CARE PHYSICIAN: [name], MD   REQUESTING/REFERRING PHYSICIAN: [email or web address]  CHIEF COMPLAINT:   Chief Complaint  Patient presents with  . Shortness of Breath    HISTORY OF PRESENT ILLNESS: James [name]  is a 68 y.o. male with a known history of CLL, diabetes, hematuria, hypertension-has been on oral tablet as chemo with his CLL. For last 1 week he has worsening shortness of breath with minimal exertion.  He denies any symptoms of orthopnea or leg edema.  He denies any chest pain or palpitation or cough or fever. He says the shortness of breath bothers him on minimal walking up to 10 steps to the bathroom.  He denies any cough or sputum production In ER he was noted to have acute renal failure with some hyperkalemia but his chest x-ray was clear without acute findings.  His COVID-19 test was negative.  PAST MEDICAL HISTORY:   Past Medical History:  Diagnosis Date  . CLL (chronic lymphocytic leukemia) ([place])   . Diabetes mellitus without complication ([place])   . Hematuria   . Hypertension     PAST SURGICAL HISTORY:  Past Surgical History:  Procedure Laterality Date  . CATARACT EXTRACTION W/ INTRAOCULAR LENS IMPLANT Bilateral   . CHOLECYSTECTOMY  1983  . COLONOSCOPY WITH PROPOFOL N/A 10/27/2017   Procedure: COLONOSCOPY WITH PROPOFOL;  Surgeon: Manya Silvas, MD;  Location: [facility] ENDOSCOPY;  Service: Endoscopy;  Laterality: N/A;  . ESOPHAGOGASTRODUODENOSCOPY (EGD) WITH PROPOFOL N/A 11/20/2016   Procedure: ESOPHAGOGASTRODUODENOSCOPY (EGD) WITH PROPOFOL;  Surgeon: Manya Silvas, MD;  Location: [facility] ENDOSCOPY;  Service: Endoscopy;  Laterality: N/A;  . ESOPHAGOGASTRODUODENOSCOPY (EGD) WITH PROPOFOL N/A 10/27/2017   Procedure: ESOPHAGOGASTRODUODENOSCOPY (EGD) WITH PROPOFOL;  Surgeon: Manya Silvas, MD;   Location: [facility] ENDOSCOPY;  Service: Endoscopy;  Laterality: N/A;  . LOWER EXTREMITY ANGIOGRAPHY Right 05/19/2017   Procedure: LOWER EXTREMITY ANGIOGRAPHY;  Surgeon: Algernon Huxley, MD;  Location: [place] CV LAB;  Service: Cardiovascular;  Laterality: Right;  . LOWER EXTREMITY ANGIOGRAPHY Left 08/10/2018   Procedure: LOWER EXTREMITY ANGIOGRAPHY;  Surgeon: Algernon Huxley, MD;  Location: [place] CV LAB;  Service: Cardiovascular;  Laterality: Left;    SOCIAL HISTORY:  Social History   Tobacco Use  . Smoking status: Current Every Day Smoker    Packs/day: 0.50    Years: 47.00    Pack years: 23.50    Types: Cigarettes  . Smokeless tobacco: Never Used  Substance Use Topics  . Alcohol use: Yes    Alcohol/week: 1.0 standard drinks    Types: 1 Glasses of wine per week    Comment:  almost none in last 6 months    FAMILY HISTORY:  Family History  Problem Relation Age of Onset  . Hypertension Sister     DRUG ALLERGIES: No Known Allergies  REVIEW OF SYSTEMS:   CONSTITUTIONAL: No fever, have fatigue or weakness.  EYES: No blurred or double vision.  EARS, NOSE, AND THROAT: No tinnitus or ear pain.  RESPIRATORY: No cough, have shortness of breath, no wheezing or hemoptysis.  CARDIOVASCULAR: No chest pain, orthopnea, edema.  GASTROINTESTINAL: No nausea, vomiting, diarrhea or abdominal pain.  GENITOURINARY: No dysuria, hematuria.  ENDOCRINE: No polyuria, nocturia,  HEMATOLOGY: No anemia, easy bruising or bleeding SKIN: No rash or lesion. MUSCULOSKELETAL: No joint pain or arthritis.  NEUROLOGIC: No tingling, numbness, weakness.  PSYCHIATRY: No anxiety or depression.   MEDICATIONS AT HOME:  Prior to Admission medications   Medication Sig Start Date End Date Taking? Authorizing Provider  amLODipine (NORVASC) 10 MG tablet Take 1 tablet by mouth once daily 11/19/18   Cammie Sickle, MD  aspirin EC 81 MG tablet Take 81 mg by mouth daily.    [provider]   atorvastatin (LIPITOR) 10 MG tablet Take 1 tablet (10 mg total) by mouth daily. 05/19/17 06/12/27  Algernon Huxley, MD  clopidogrel (PLAVIX) 75 MG tablet Take 1 tablet by mouth once daily 10/28/18   Stegmayer, Kimberly A, PA-C  IMBRUVICA 280 MG TABS TAKE 1 TABLET BY MOUTH DAILY. 11/25/18   Cammie Sickle, MD  iron polysaccharides (NU-IRON) 150 MG capsule Take 150 mg by mouth daily.  06/17/17 06/17/18  [provider]  lisinopril (PRINIVIL,ZESTRIL) 30 MG tablet Take 30 mg by mouth daily. 09/16/17   [provider]  metFORMIN (GLUCOPHAGE) 1000 MG tablet Take 1,000 mg by mouth 2 (two) times daily. 07/02/18   [provider]  metoprolol tartrate (LOPRESSOR) 25 MG tablet Take 1 tablet by mouth twice daily 07/28/18   Cammie Sickle, MD  omeprazole (PRILOSEC) 40 MG capsule Take 40 mg by mouth daily.  06/17/17   [provider]  oxyCODONE-acetaminophen (PERCOCET/ROXICET) 5-325 MG tablet 1 pill every 8-12 hours 12/14/18   Cammie Sickle, MD  sodium polystyrene (SPS) 15 GM/60ML suspension TAKE AS DIRECTED (30 GRAMS) FOR 1 DOSE 06/26/18   [provider]  sucralfate (CARAFATE) 1 g tablet DISSOLVE 1 TABLET IN WATER AS DIRECTED BY YOUR DOCTOR AND DRINK BY MOUTH 4 TIMES DAILY (BEFORE MEALS AND NIGHTLY) 09/22/18   [provider]      PHYSICAL EXAMINATION:   VITAL SIGNS: Blood pressure 107/68, pulse 70, temperature 99 F (37.2 C), temperature source Oral, resp. rate 18, height 5\' 8"  (1.727 m), weight 65.8 kg, SpO2 100 %.  GENERAL:  68 y.o.-year-old patient lying in the bed with no acute distress.  EYES: Pupils equal, round, reactive to light and accommodation. No scleral icterus. Extraocular muscles intact.  HEENT: Head atraumatic, normocephalic. Oropharynx and nasopharynx clear.  NECK:  Supple, no jugular venous distention. No thyroid enlargement, no tenderness.  LUNGS: Normal breath sounds bilaterally, no wheezing, no crepitation. No use of  accessory muscles of respiration.  CARDIOVASCULAR: S1, S2 normal. No murmurs, rubs, or gallops.  ABDOMEN: Soft, nontender, nondistended. Bowel sounds present. No organomegaly or mass.  EXTREMITIES: No pedal edema, cyanosis, or clubbing.  NEUROLOGIC: Cranial nerves II through XII are intact. Muscle strength 5/5 in all extremities. Sensation intact. Gait not checked.  PSYCHIATRIC: The patient is alert and oriented x 3.  SKIN: No obvious rash, lesion, or ulcer.   LABORATORY PANEL:   CBC Recent Labs  Lab 12/28/18 1007  WBC 27.9*  HGB 10.1*  HCT 33.0*  PLT 165  MCV 110.0*  MCH 33.7  MCHC 30.6  RDW 16.2*   ------------------------------------------------------------------------------------------------------------------  Chemistries  Recent Labs  Lab 12/28/18 1007  NA 141  K 5.9*  CL 115*  CO2 17*  GLUCOSE 110*  BUN 56*  CREATININE 2.28*  CALCIUM 9.1   ------------------------------------------------------------------------------------------------------------------ estimated creatinine clearance is 28.5 mL/min (A) (by C-G formula based on SCr of 2.28 mg/dL (H)). ------------------------------------------------------------------------------------------------------------------ No results for input(s): TSH, T4TOTAL, T3FREE, THYROIDAB in the last 72 hours.  Invalid input(s): FREET3   Coagulation profile No results for input(s): INR, PROTIME in  the last 168 hours. ------------------------------------------------------------------------------------------------------------------- No results for input(s): DDIMER in the last 72 hours. -------------------------------------------------------------------------------------------------------------------  Cardiac Enzymes No results for input(s): CKMB, TROPONINI, MYOGLOBIN in the last 168 hours.  Invalid input(s):  CK ------------------------------------------------------------------------------------------------------------------ Invalid input(s): POCBNP  ---------------------------------------------------------------------------------------------------------------  Urinalysis    Component Value Date/Time   COLORURINE YELLOW (A) 02/04/2018 1411   APPEARANCEUR CLEAR (A) 02/04/2018 1411   LABSPEC 1.016 02/04/2018 1411   PHURINE 5.0 02/04/2018 1411   GLUCOSEU NEGATIVE 02/04/2018 1411   HGBUR SMALL (A) 02/04/2018 1411   BILIRUBINUR NEGATIVE 02/04/2018 1411   KETONESUR NEGATIVE 02/04/2018 1411   PROTEINUR NEGATIVE 02/04/2018 1411   NITRITE NEGATIVE 02/04/2018 1411   LEUKOCYTESUR NEGATIVE 02/04/2018 1411     RADIOLOGY: Dg Chest 2 View  Result Date: 12/28/2018 CLINICAL DATA:  Shortness of breath EXAM: CHEST - 2 VIEW COMPARISON:  CT chest, 01/20/2018 FINDINGS: The heart size and mediastinal contours are within normal limits. Both lungs are clear. Disc degenerative disease of the lower thoracic spine. IMPRESSION: No acute abnormality of the lungs. Electronically Signed   By: Eddie Candle M.D.   On: 12/28/2018 11:29    EKG: Orders placed or performed during the hospital encounter of 12/28/18  . ED EKG  . ED EKG    IMPRESSION AND PLAN:  *Shortness of breath- COPD Patient is a smoker for more than 45 years. He was never diagnosed with COPD. There is CT scan of the chest last year shows emphysematous changes. As he is a smoker and her diabetes I will also get echocardiogram to rule out underlying CHF though currently does not seem to have exacerbation symptoms. I will start on nebulizer therapy and counseled to quit smoking. He may need to have long-acting inhalers on discharge.  *Acute renal failure on CKD stage III Significant worsening on renal function. Avoid nephrotoxic medication. Blood pressure is running borderline so I will hold most of his antihypertensive medications. Gentle IV  hydration and monitor.  *Hyperkalemia Due to worsening renal function Give Veltassa 1 dose, calcium gluconate 1 dose, albuterol inhalers. Monitor kidney function and potassium closely.  *Hypertension Hold medications due to renal failure and borderline blood pressure currently.  *Gastroesophageal reflux disease Continue PPI and sucralfate.  *Diabetes mellitus type 2 Continue metformin and keep on sliding scale coverage.  * CLL On oral antineoplastic medication and follows with Dr. B as outpatient. -To continue the same.  *Active smoking Counseled to quit smoking for 4 minutes and offered nicotine patch.  All the records are reviewed and case discussed with ED provider. Management plans discussed with the patient, family and they are in agreement.  CODE STATUS: DNR Code Status History    Date Active Date Inactive Code Status Order ID Comments User Context   08/10/2018 1126 08/10/2018 1625 Full Code [id number]  Algernon Huxley, MD Inpatient   05/19/2017 1435 05/19/2017 1903 Full Code [id number]  Algernon Huxley, MD Inpatient   Advance Care Planning Activity       TOTAL TIME TAKING CARE OF THIS PATIENT: 45 minutes.    Vaughan Basta M.D on 12/28/2018   Between 7am to 6pm - Pager - [phone]  After 6pm go to [email or web address] - password [email or web address] [facility]  Office  [phone]  CC: Primary care physician; [name], MD   Note: This dictation was prepared with Dragon dictation along with smaller phrase technology. Any transcriptional errors that result from this process are unintentional.

## 2018-12-28 NOTE — ED Provider Notes (Signed)
[facility] Emergency Department Provider Note   ____________________________________________    I have reviewed the triage vital signs and the nursing notes.   HISTORY  Chief Complaint Shortness of Breath     HPI James [name] is a 68 y.o. male with a history of CLL, diabetes followed by Dr. Rogue Bussing who presents today with complaints of shortness of breath.  Patient reports exertional shortness of breath when walking to the bathroom today.  This is been getting worse over the last several days.  Denies chest pain.  No significant cough.  No fevers or chills.  No nausea or vomiting.  Past Medical History:  Diagnosis Date  . CLL (chronic lymphocytic leukemia) ([place])   . Diabetes mellitus without complication ([place])   . Hematuria   . Hypertension     Patient Active Problem List   Diagnosis Date Noted  . CKD stage 3 secondary to diabetes ([place]) 08/04/2018  . Atherosclerosis of native arteries of the extremities with ulceration ([place]) 08/04/2018  . Anemia due to stage 3 chronic kidney disease ([place]) 04/15/2018  . Atherosclerosis of native arteries of extremity with rest pain ([place]) 05/13/2017  . Hyperlipidemia 05/06/2017  . Type 2 diabetes mellitus with complication ([place]) 00/93/8182  . Essential hypertension 05/06/2017  . Atherosclerotic peripheral vascular disease with intermittent claudication ([place]) 05/06/2017  . Toe cyanosis 05/06/2017  . Malignant lymphoma, small lymphocytic ([place]) 03/25/2016  . Generalized abdominal pain 12/15/2015  . CLL (chronic lymphoid leukemia) in relapse ([place]) 02/20/2015    Past Surgical History:  Procedure Laterality Date  . CATARACT EXTRACTION W/ INTRAOCULAR LENS IMPLANT Bilateral   . CHOLECYSTECTOMY  1983  . COLONOSCOPY WITH PROPOFOL N/A 10/27/2017   Procedure: COLONOSCOPY WITH PROPOFOL;  Surgeon: Manya Silvas, MD;  Location: [facility] ENDOSCOPY;  Service: Endoscopy;  Laterality: N/A;  . ESOPHAGOGASTRODUODENOSCOPY  (EGD) WITH PROPOFOL N/A 11/20/2016   Procedure: ESOPHAGOGASTRODUODENOSCOPY (EGD) WITH PROPOFOL;  Surgeon: Manya Silvas, MD;  Location: [facility] ENDOSCOPY;  Service: Endoscopy;  Laterality: N/A;  . ESOPHAGOGASTRODUODENOSCOPY (EGD) WITH PROPOFOL N/A 10/27/2017   Procedure: ESOPHAGOGASTRODUODENOSCOPY (EGD) WITH PROPOFOL;  Surgeon: Manya Silvas, MD;  Location: [facility] ENDOSCOPY;  Service: Endoscopy;  Laterality: N/A;  . LOWER EXTREMITY ANGIOGRAPHY Right 05/19/2017   Procedure: LOWER EXTREMITY ANGIOGRAPHY;  Surgeon: Algernon Huxley, MD;  Location: [place] CV LAB;  Service: Cardiovascular;  Laterality: Right;  . LOWER EXTREMITY ANGIOGRAPHY Left 08/10/2018   Procedure: LOWER EXTREMITY ANGIOGRAPHY;  Surgeon: Algernon Huxley, MD;  Location: [place] CV LAB;  Service: Cardiovascular;  Laterality: Left;    Prior to Admission medications   Medication Sig Start Date End Date Taking? Authorizing Provider  amLODipine (NORVASC) 10 MG tablet Take 1 tablet by mouth once daily 11/19/18   Cammie Sickle, MD  aspirin EC 81 MG tablet Take 81 mg by mouth daily.    [provider]  atorvastatin (LIPITOR) 10 MG tablet Take 1 tablet (10 mg total) by mouth daily. 05/19/17 06/12/27  Algernon Huxley, MD  clopidogrel (PLAVIX) 75 MG tablet Take 1 tablet by mouth once daily 10/28/18   Stegmayer, Kimberly A, PA-C  IMBRUVICA 280 MG TABS TAKE 1 TABLET BY MOUTH DAILY. 11/25/18   Cammie Sickle, MD  iron polysaccharides (NU-IRON) 150 MG capsule Take 150 mg by mouth daily.  06/17/17 06/17/18  [provider]  lisinopril (PRINIVIL,ZESTRIL) 30 MG tablet Take 30 mg by mouth daily. 09/16/17   [provider]  metFORMIN (GLUCOPHAGE) 1000 MG tablet Take  1,000 mg by mouth 2 (two) times daily. 07/02/18   [provider]  metoprolol tartrate (LOPRESSOR) 25 MG tablet Take 1 tablet by mouth twice daily 07/28/18   Cammie Sickle, MD  omeprazole (PRILOSEC) 40 MG capsule Take 40 mg by mouth daily.   06/17/17   [provider]  oxyCODONE-acetaminophen (PERCOCET/ROXICET) 5-325 MG tablet 1 pill every 8-12 hours 12/14/18   Cammie Sickle, MD  sodium polystyrene (SPS) 15 GM/60ML suspension TAKE AS DIRECTED (30 GRAMS) FOR 1 DOSE 06/26/18   [provider]  sucralfate (CARAFATE) 1 g tablet DISSOLVE 1 TABLET IN WATER AS DIRECTED BY YOUR DOCTOR AND DRINK BY MOUTH 4 TIMES DAILY (BEFORE MEALS AND NIGHTLY) 09/22/18   [provider]     Allergies Patient has no known allergies.  Family History  Problem Relation Age of Onset  . Hypertension Sister     Social History Social History   Tobacco Use  . Smoking status: Current Every Day Smoker    Packs/day: 0.50    Years: 47.00    Pack years: 23.50    Types: Cigarettes  . Smokeless tobacco: Never Used  Substance Use Topics  . Alcohol use: Yes    Alcohol/week: 1.0 standard drinks    Types: 1 Glasses of wine per week    Comment:  almost none in last 6 months  . Drug use: No    Review of Systems  Constitutional: No fever/chills Eyes: No visual changes.  ENT: No sore throat. Cardiovascular: Denies chest pain. Respiratory: As above Gastrointestinal: No abdominal pain.  No nausea, no vomiting.   Genitourinary: Negative for dysuria. Musculoskeletal: Negative for back pain. Skin: Negative for rash. Neurological: Negative for headaches    ____________________________________________   PHYSICAL EXAM:  VITAL SIGNS: ED Triage Vitals  Enc Vitals Group     BP 12/28/18 1006 104/65     Pulse Rate 12/28/18 1006 (!) 103     Resp 12/28/18 1006 18     Temp 12/28/18 1006 99 F (37.2 C)     Temp Source 12/28/18 1006 Oral     SpO2 12/28/18 1006 100 %     Weight 12/28/18 1006 65.8 kg (145 lb)     Height 12/28/18 1006 1.727 m (5\' 8" )     Head Circumference --      Peak Flow --      Pain Score 12/28/18 1026 3     Pain Loc --      Pain Edu? --      Excl. in [place]? --     Constitutional: Alert and oriented.   Eyes: Conjunctivae are normal.   Nose: No congestion/rhinnorhea. Mouth/Throat: Mucous membranes are moist.    Cardiovascular: Normal rate, regular rhythm. Grossly normal heart sounds.  Good peripheral circulation. Respiratory: Normal respiratory effort.  No retractions.  Bibasilar Rales Gastrointestinal: Soft and nontender. No distention.  No CVA tenderness.  Musculoskeletal:   Warm and well perfused Neurologic:  Normal speech and language. No gross focal neurologic deficits are appreciated.  Skin:  Skin is warm, dry and intact. No rash noted. Psychiatric: Mood and affect are normal. Speech and behavior are normal.  ____________________________________________   LABS (all labs ordered are listed, but only abnormal results are displayed)  Labs Reviewed  BASIC METABOLIC PANEL - Abnormal; Notable for the following components:      Result Value   Potassium 5.9 (*)    Chloride 115 (*)    CO2 17 (*)    Glucose, Bld  110 (*)    BUN 56 (*)    Creatinine, Ser 2.28 (*)    GFR calc non Af Amer 28 (*)    GFR calc Af Amer 33 (*)    All other components within normal limits  CBC - Abnormal; Notable for the following components:   WBC 27.9 (*)    RBC 3.00 (*)    Hemoglobin 10.1 (*)    HCT 33.0 (*)    MCV 110.0 (*)    RDW 16.2 (*)    All other components within normal limits  SARS CORONAVIRUS 2 (HOSPITAL ORDER, [place] LAB)  CULTURE, BLOOD (ROUTINE X 2)  CULTURE, BLOOD (ROUTINE X 2)  LACTIC ACID, PLASMA  LACTIC ACID, PLASMA   ____________________________________________  EKG  None ____________________________________________  RADIOLOGY  Chest x-ray unremarkable ____________________________________________   PROCEDURES  Procedure(s) performed: No  Procedures   Critical Care performed: No ____________________________________________   INITIAL IMPRESSION / ASSESSMENT AND PLAN / ED COURSE  Pertinent labs & imaging results that were  available during my care of the patient were reviewed by me and considered in my medical decision making (see chart for details).  Patient with a history of CLL, his white blood cell count here is 27.9, that seems to be a significant increase from his baseline however he is afebrile and his chest x-ray is reassuring.  No dysuria or abdominal pain.  He also has evidence of an acute on chronic kidney injury likely secondary to dehydration, we will give IV fluids.  His current virus test is negative.  I will send cultures, lactic and admit to the hospitalist service    ____________________________________________   FINAL CLINICAL IMPRESSION(S) / ED DIAGNOSES  Final diagnoses:  Shortness of breath  Acute kidney injury [facility])        Note:  This document was prepared using Dragon voice recognition software and may include unintentional dictation errors.   Lavonia Drafts, MD 12/28/18 [phone]

## 2018-12-29 ENCOUNTER — Observation Stay
Admit: 2018-12-29 | Discharge: 2018-12-29 | Disposition: A | Discharge status: Home / Self Care | Payer: Medicare Other | Attending: Internal Medicine | Admitting: Internal Medicine

## 2018-12-29 DIAGNOSIS — R109 Unspecified abdominal pain: Secondary | ICD-10-CM | POA: Diagnosis present

## 2018-12-29 DIAGNOSIS — Z6822 Body mass index (BMI) 22.0-22.9, adult: Secondary | ICD-10-CM | POA: Diagnosis not present

## 2018-12-29 DIAGNOSIS — D539 Nutritional anemia, unspecified: Secondary | ICD-10-CM | POA: Diagnosis present

## 2018-12-29 DIAGNOSIS — C9110 Chronic lymphocytic leukemia of B-cell type not having achieved remission: Secondary | ICD-10-CM | POA: Diagnosis present

## 2018-12-29 DIAGNOSIS — D631 Anemia in chronic kidney disease: Secondary | ICD-10-CM | POA: Diagnosis present

## 2018-12-29 DIAGNOSIS — Z20828 Contact with and (suspected) exposure to other viral communicable diseases: Secondary | ICD-10-CM | POA: Diagnosis present

## 2018-12-29 DIAGNOSIS — Z8249 Family history of ischemic heart disease and other diseases of the circulatory system: Secondary | ICD-10-CM | POA: Diagnosis not present

## 2018-12-29 DIAGNOSIS — Z66 Do not resuscitate: Secondary | ICD-10-CM | POA: Diagnosis present

## 2018-12-29 DIAGNOSIS — N183 Chronic kidney disease, stage 3 (moderate): Secondary | ICD-10-CM | POA: Diagnosis present

## 2018-12-29 DIAGNOSIS — R634 Abnormal weight loss: Secondary | ICD-10-CM | POA: Diagnosis present

## 2018-12-29 DIAGNOSIS — J449 Chronic obstructive pulmonary disease, unspecified: Secondary | ICD-10-CM | POA: Diagnosis present

## 2018-12-29 DIAGNOSIS — D72829 Elevated white blood cell count, unspecified: Secondary | ICD-10-CM | POA: Diagnosis present

## 2018-12-29 DIAGNOSIS — E538 Deficiency of other specified B group vitamins: Secondary | ICD-10-CM | POA: Diagnosis present

## 2018-12-29 DIAGNOSIS — E875 Hyperkalemia: Secondary | ICD-10-CM | POA: Diagnosis present

## 2018-12-29 DIAGNOSIS — E1122 Type 2 diabetes mellitus with diabetic chronic kidney disease: Secondary | ICD-10-CM | POA: Diagnosis present

## 2018-12-29 DIAGNOSIS — R0602 Shortness of breath: Secondary | ICD-10-CM | POA: Diagnosis present

## 2018-12-29 DIAGNOSIS — K219 Gastro-esophageal reflux disease without esophagitis: Secondary | ICD-10-CM | POA: Diagnosis present

## 2018-12-29 DIAGNOSIS — N179 Acute kidney failure, unspecified: Secondary | ICD-10-CM | POA: Diagnosis present

## 2018-12-29 DIAGNOSIS — G8929 Other chronic pain: Secondary | ICD-10-CM | POA: Diagnosis present

## 2018-12-29 DIAGNOSIS — F17210 Nicotine dependence, cigarettes, uncomplicated: Secondary | ICD-10-CM | POA: Diagnosis present

## 2018-12-29 DIAGNOSIS — I129 Hypertensive chronic kidney disease with stage 1 through stage 4 chronic kidney disease, or unspecified chronic kidney disease: Secondary | ICD-10-CM | POA: Diagnosis present

## 2018-12-29 LAB — BLOOD CULTURE ID PANEL (REFLEXED)

## 2018-12-29 LAB — ECHOCARDIOGRAM COMPLETE
Height: 68 in
Weight: 2320 oz

## 2018-12-29 LAB — TECHNOLOGIST SMEAR REVIEW

## 2018-12-29 LAB — HEMOGLOBIN AND HEMATOCRIT, BLOOD
HCT: 26.8 % — ABNORMAL LOW (ref 39.0–52.0)
Hemoglobin: 8.6 g/dL — ABNORMAL LOW (ref 13.0–17.0)

## 2018-12-29 LAB — BASIC METABOLIC PANEL
Anion gap: 6 (ref 5–15)
BUN: 47 mg/dL — ABNORMAL HIGH (ref 8–23)
CO2: 17 mmol/L — ABNORMAL LOW (ref 22–32)
Calcium: 8.2 mg/dL — ABNORMAL LOW (ref 8.9–10.3)
Chloride: 115 mmol/L — ABNORMAL HIGH (ref 98–111)
Creatinine, Ser: 1.5 mg/dL — ABNORMAL HIGH (ref 0.61–1.24)
GFR calc Af Amer: 54 mL/min — ABNORMAL LOW (ref >60)
GFR calc non Af Amer: 47 mL/min — ABNORMAL LOW (ref >60)
Glucose, Bld: 77 mg/dL (ref 70–99)
Potassium: 4.8 mmol/L (ref 3.5–5.1)
Sodium: 138 mmol/L (ref 135–145)

## 2018-12-29 LAB — GLUCOSE, CAPILLARY
Glucose-Capillary: 79 mg/dL (ref 70–99)
Glucose-Capillary: 105 mg/dL — ABNORMAL HIGH (ref 70–99)
Glucose-Capillary: 103 mg/dL — ABNORMAL HIGH (ref 70–99)
Glucose-Capillary: 123 mg/dL — ABNORMAL HIGH (ref 70–99)

## 2018-12-29 LAB — CBC
HCT: 24.1 % — ABNORMAL LOW (ref 39.0–52.0)
Hemoglobin: 7.5 g/dL — ABNORMAL LOW (ref 13.0–17.0)
MCH: 33.9 pg (ref 26.0–34.0)
MCHC: 31.1 g/dL (ref 30.0–36.0)
MCV: 109 fL — ABNORMAL HIGH (ref 80.0–100.0)
Platelets: 105 10*3/uL — ABNORMAL LOW (ref 150–400)
RBC: 2.21 MIL/uL — ABNORMAL LOW (ref 4.22–5.81)
RDW: 16.1 % — ABNORMAL HIGH (ref 11.5–15.5)
WBC: 13.3 10*3/uL — ABNORMAL HIGH (ref 4.0–10.5)
nRBC: 0 % (ref 0.0–0.2)

## 2018-12-29 LAB — PREPARE RBC (CROSSMATCH)

## 2018-12-29 MED ORDER — IPRATROPIUM-ALBUTEROL 0.5-2.5 (3) MG/3ML IN SOLN
3.0000 mL | Freq: Three times a day (TID) | RESPIRATORY_TRACT | Status: DC
  Administered 2018-12-30 (×2): 3 mL via RESPIRATORY_TRACT
  Filled 2018-12-29 (×2): qty 3

## 2018-12-29 MED ORDER — SODIUM CHLORIDE 0.9% IV SOLUTION
Freq: Once | INTRAVENOUS | Status: AC
  Administered 2018-12-29: 10:00:00 via INTRAVENOUS

## 2018-12-29 NOTE — Progress Notes (Signed)
[place] [facility] [place] NAME: James [name]    MR#:  [id number]  DATE OF BIRTH:  1951/04/15  SUBJECTIVE:  CHIEF COMPLAINT:   Chief Complaint  Patient presents with  . Shortness of Breath  anemic, feels short of breath REVIEW OF SYSTEMS:  Review of Systems  Constitutional: Negative for diaphoresis, fever, malaise/fatigue and weight loss.  HENT: Negative for ear discharge, ear pain, hearing loss, nosebleeds, sore throat and tinnitus.   Eyes: Negative for blurred vision and pain.  Respiratory: Positive for shortness of breath. Negative for cough, hemoptysis and wheezing.   Cardiovascular: Negative for chest pain, palpitations, orthopnea and leg swelling.  Gastrointestinal: Negative for abdominal pain, blood in stool, constipation, diarrhea, heartburn, nausea and vomiting.  Genitourinary: Negative for dysuria, frequency and urgency.  Musculoskeletal: Negative for back pain and myalgias.  Skin: Negative for itching and rash.  Neurological: Negative for dizziness, tingling, tremors, focal weakness, seizures, weakness and headaches.  Psychiatric/Behavioral: Negative for depression. The patient is not nervous/anxious.     DRUG ALLERGIES:  No Known Allergies VITALS:  Blood pressure 109/70, pulse 83, temperature 98.7 F (37.1 C), temperature source Oral, resp. rate 18, height 5\' 8"  (1.727 m), weight 65.8 kg, SpO2 100 %. PHYSICAL EXAMINATION:  Physical Exam HENT:     Head: Normocephalic and atraumatic.  Eyes:     Conjunctiva/sclera: Conjunctivae normal.     Pupils: Pupils are equal, round, and reactive to light.  Neck:     Musculoskeletal: Normal range of motion and neck supple.     Thyroid: No thyromegaly.     Trachea: No tracheal deviation.  Cardiovascular:     Rate and Rhythm: Normal rate and regular rhythm.     Heart sounds: Normal heart sounds.  Pulmonary:     Effort: Pulmonary effort is normal. No respiratory distress.   Breath sounds: Normal breath sounds. No wheezing.  Chest:     Chest wall: No tenderness.  Abdominal:     General: Bowel sounds are normal. There is no distension.     Palpations: Abdomen is soft.     Tenderness: There is no abdominal tenderness.  Musculoskeletal: Normal range of motion.  Skin:    General: Skin is warm and dry.     Findings: No rash.  Neurological:     Mental Status: He is alert and oriented to person, place, and time.     Cranial Nerves: No cranial nerve deficit.    LABORATORY PANEL:  Male CBC Recent Labs  Lab 12/29/18 0440  WBC 13.3*  HGB 7.5*  HCT 24.1*  PLT 105*   ------------------------------------------------------------------------------------------------------------------ Chemistries  Recent Labs  Lab 12/29/18 0440  NA 138  K 4.8  CL 115*  CO2 17*  GLUCOSE 77  BUN 47*  CREATININE 1.50*  CALCIUM 8.2*   RADIOLOGY:  No results found. ASSESSMENT AND PLAN:  68 y.o. male with a known history of CLL, diabetes, hematuria, hypertension being admitted for shortness of breath  *Shortness of breath-likely due to severe anemia  Patient is a smoker for more than 45 years. He was never diagnosed with COPD. There is CT scan of the chest last year shows emphysematous changes. - echocardiogram shows normal LV systolic function.  There is some diastolic dysfunction. -Continue nebulizer therapy and he was counseled to quit smoking on admission. -Consider long-acting inhalers on discharge.  * Anemia of chronic disease -His hemoglobin is down to 7.5 (10.1).  He denies any obvious bleeding -We will order 1 unit of packed red blood cell which should help with his symptoms.  *Acute on CKD stage III -Creatinine improving with hydration - Avoid nephrotoxic medication. - hold most of his antihypertensive medications. -Continue IV hydration and monitor.  *Hyperkalemia: Resolved Due to worsening renal function -Status post Veltassa 1 dose, calcium  gluconate 1 dose, albuterol inhalers. Monitor kidney function and potassium closely.  *Hypertension Hold medications due to renal failure and borderline blood pressure currently.  *Gastroesophageal reflux disease Continue PPI and sucralfate.  *Diabetes mellitus type 2 Continue metformin and keep on sliding scale coverage.  * CLL On oral antineoplastic medication and follows with Dr. Jacinto Reap as outpatient. -Consult oncology  *Active smoking Counseled to quit smoking for 4 minutes and offered nicotine patch.     All the records are reviewed and case discussed with Care Management/Social Worker. Management plans discussed with the patient, nursing and they are in agreement.  CODE STATUS: DNR  TOTAL TIME TAKING CARE OF THIS PATIENT: 35 minutes.   More than 50% of the time was spent in counseling/coordination of care: YES  POSSIBLE D/C IN 1-2 DAYS, DEPENDING ON CLINICAL CONDITION.   Max Sane M.D on 12/29/2018 at 3:03 PM  Between 7am to 6pm - Pager - [phone]  After 6pm go to [email or web address] - Proofreader  [facility] [place] [facility]  Office  [phone]  CC: Primary care physician; [name], MD  Note: This dictation was prepared with Dragon dictation along with smaller phrase technology. Any transcriptional errors that result from this process are unintentional.

## 2018-12-29 NOTE — Progress Notes (Signed)
PHARMACY - PHYSICIAN COMMUNICATION CRITICAL VALUE ALERT - BLOOD CULTURE IDENTIFICATION (BCID)  James [name] is an 68 y.o. male who presented to [facility] on 12/28/2018 with a chief complaint of SOB  Assessment:  Patient admitted with new COPD and acute renal failure. BCx showing 1 of 4 GPR in anaerobic bottle. And 1 of 4 GPC, BCID identified staph species, MecA (+).    Patient is afebrile. WBC trended down from 27.9 >> 13.9 without antibiotics. Patient does have a PMH of CLL. No signs of infection.   Name of physician (or Provider) Contacted: Dr. Manuella Ghazi   Current antibiotics: None   Changes to prescribed antibiotics recommended:  After discussion with provider no antibiotics will be started at this time. Patient does not have any signs of infection currently.  These results most likely represents a contaminant. Will continue to monitor.   James [name], PharmD, BCPS Clinical Pharmacist 12/29/2018 12:34 PM

## 2018-12-29 NOTE — Care Management Obs Status (Signed)
[place] NOTIFICATION   Patient Details  Name: James [name] MRN: [id number] Date of Birth: 09-17-1950   Medicare Observation Status Notification Given:  Yes    Manuelita Moxon, Veronia Beets, Marlinda Mike 12/29/2018, 10:31 AM

## 2018-12-29 NOTE — Progress Notes (Signed)
*  PRELIMINARY RESULTS* Echocardiogram 2D Echocardiogram has been performed.  James [name] James [name] 12/29/2018, 9:20 AM

## 2018-12-29 NOTE — TOC Initial Note (Signed)
Transition of Care [facility]) - Initial/Assessment Note    Patient Details  Name: James [name] MRN: [id number] Date of Birth: 09/02/1950  Transition of Care [facility]) CM/SW Contact:    Yarel Rushlow, Lenice Llamas Phone Number: [phone]  12/29/2018, 10:50 AM  Clinical Narrative: Clinical Social Worker (CSW) met with patient alone at bedside to discuss D/C plan. Patient was alert and oriented X4 and was laying in the bed. CSW introduced self and explained role of CSW department. Per patient he lives in [place] with his wife [name]. Patient reported that he is independent with his ADLs and still drives. Patient reported that he plans to return home from [facility]. Patient reported no needs or concerns. CSW will continue to follow and assist as needed.                Expected Discharge Plan: Home/Self Care Barriers to Discharge: Continued Medical Work up   Patient Goals and CMS Choice Patient states their goals for this hospitalization and ongoing recovery are:: To go home.      Expected Discharge Plan and Services Expected Discharge Plan: Home/Self Care In-house Referral: Clinical Social Work     Living arrangements for the past 2 months: Single Family Home Expected Discharge Date: 12/29/18                                    Prior Living Arrangements/Services Living arrangements for the past 2 months: Single Family Home Lives with:: Spouse Patient language and need for interpreter reviewed:: No Do you feel safe going back to the place where you live?: Yes      Need for Family Participation in Patient Care: No (Comment) Care giver support system in place?: No (comment)   Criminal Activity/Legal Involvement Pertinent to Current Situation/Hospitalization: No - Comment as needed  Activities of Daily Living Home Assistive Devices/Equipment: None ADL Screening (condition at time of admission) Patient's cognitive ability adequate to safely complete daily activities?: Yes Is the  patient deaf or have difficulty hearing?: No Does the patient have difficulty seeing, even when wearing glasses/contacts?: No Does the patient have difficulty concentrating, remembering, or making decisions?: No Patient able to express need for assistance with ADLs?: Yes Does the patient have difficulty dressing or bathing?: No Independently performs ADLs?: Yes (appropriate for developmental age) Does the patient have difficulty walking or climbing stairs?: No Weakness of Legs: None Weakness of Arms/Hands: None  Permission Sought/Granted                  Emotional Assessment Appearance:: Appears stated age Attitude/Demeanor/Rapport: Engaged Affect (typically observed): Pleasant, Calm Orientation: : Oriented to Self, Oriented to Place, Oriented to  Time, Oriented to Situation Alcohol / Substance Use: Not Applicable Psych Involvement: No (comment)  Admission diagnosis:  Shortness of breath [R06.02] Acute kidney injury [facility]) [N17.9] Patient Active Problem List   Diagnosis Date Noted  . Shortness of breath 12/28/2018  . Acute on chronic renal failure ([place]) 12/28/2018  . SOB (shortness of breath) 12/28/2018  . CKD stage 3 secondary to diabetes ([place]) 08/04/2018  . Atherosclerosis of native arteries of the extremities with ulceration ([place]) 08/04/2018  . Anemia due to stage 3 chronic kidney disease ([place]) 04/15/2018  . Atherosclerosis of native arteries of extremity with rest pain ([place]) 05/13/2017  . Hyperlipidemia 05/06/2017  . Type 2 diabetes mellitus with complication ([place]) 65/06/5463  . Essential hypertension 05/06/2017  . Atherosclerotic peripheral  vascular disease with intermittent claudication ([place]) 05/06/2017  . Toe cyanosis 05/06/2017  . Malignant lymphoma, small lymphocytic ([place]) 03/25/2016  . Generalized abdominal pain 12/15/2015  . CLL (chronic lymphoid leukemia) in relapse ([place]) 02/20/2015   PCP:  [name], MD Pharmacy:   [facility] [address]  ([address]), [place] - [place] ([place]) [place] [address] Phone: [phone] Fax: [place], [place] - [place] [address] Phone: [phone] Fax: [phone]     Social Determinants of Health (SDOH) Interventions    Readmission Risk Interventions No flowsheet data found.

## 2018-12-29 NOTE — Assessment & Plan Note (Addendum)
68 year old male patient history of CLL currently on ibrutinib is currently admitted to the hospital for chronic anemia worsening.  #Chronic macrocytic anemia-worsening; the etiology is unclear-recent bone marrow biopsy dyserythropoietic changes but inconclusive/NGS negative; no obvious evidence of CLL.  Hemoglobin 7.8 post 1 unit of PRBC transfusion.  Recommend 1 more unit of transfusion today.  Folic acid deficiency noted.  Recommend folic acid once a day.   #Shortness of breath-chronic multifactorial/COPD-worsening anemia.  Agree with PRBC transfusion/nebulization stable.  See above.  #Leukocytosis-improved.  Blood cultures likely contaminant.  #Acute on chronic kidney disease-likely prerenal; improving.  Discussed with Dr. Posey Pronto.  Patient stable for discharge from hematology standpoint.  Follow-up as planned next week.

## 2018-12-29 NOTE — Consult Note (Signed)
[facility] CONSULT NOTE  Patient Care Team: [name], MD as PCP - General (Internal Medicine)  CHIEF COMPLAINTS/PURPOSE OF CONSULTATION:  Worsening anemia/CLL  HISTORY OF PRESENTING ILLNESS:  James [name] 68 y.o.  male history of CLL high risk cytogenetics most recently on ibrutinib is currently admitted to hospital for worsening shortness of breath/fatigue.  Patient noted to have a hemoglobin of 7.5; patient is currently getting PRBC transfusion.  Patient has a progressive anemia for the last many months.  Bone marrow biopsy in oct 2019; showed dyserythropoietic changes without significant involvement with CLL; but no obvious MDS noted.  Cytogenetics/NGS normal.  Patient anemia thought to be from CKD-on thrombopoietin stimulating agent/IV iron as needed.  Patient states that she has progressive worsening shortness of breath cough especially exertion.  He also has chronic abdominal pain chronic nausea.  He has been losing weight.  No night sweats.   On admission patient's creatinine was 2.28/baseline 1.5; hyperkalemia potassium 5.9.  Initially found to have leukocytosis of 27.9/no differential; hemoglobin 10 platelets 165. However, platelets today are 105;  hemoglobin is 7.5.  Patient is currently getting PRBC 1 unit transfusion.   Review of Systems  Constitutional: Positive for malaise/fatigue and weight loss. Negative for chills, diaphoresis and fever.  HENT: Negative for nosebleeds and sore throat.   Eyes: Negative for double vision.  Respiratory: Positive for cough and shortness of breath. Negative for hemoptysis, sputum production and wheezing.   Cardiovascular: Negative for chest pain, palpitations, orthopnea and leg swelling.  Gastrointestinal: Positive for abdominal pain and nausea. Negative for blood in stool, constipation, diarrhea, heartburn, melena and vomiting.  Genitourinary: Negative for dysuria, frequency and urgency.  Musculoskeletal: Negative  for back pain and joint pain.  Skin: Negative.  Negative for itching and rash.  Neurological: Negative for dizziness, tingling, focal weakness, weakness and headaches.  Endo/Heme/Allergies: Does not bruise/bleed easily.  Psychiatric/Behavioral: Negative for depression. The patient is not nervous/anxious and does not have insomnia.      MEDICAL HISTORY:  Past Medical History:  Diagnosis Date  . CLL (chronic lymphocytic leukemia) ([place])   . Diabetes mellitus without complication ([place])   . Hematuria   . Hypertension     SURGICAL HISTORY: Past Surgical History:  Procedure Laterality Date  . CATARACT EXTRACTION W/ INTRAOCULAR LENS IMPLANT Bilateral   . CHOLECYSTECTOMY  1983  . COLONOSCOPY WITH PROPOFOL N/A 10/27/2017   Procedure: COLONOSCOPY WITH PROPOFOL;  Surgeon: Manya Silvas, MD;  Location: [facility] ENDOSCOPY;  Service: Endoscopy;  Laterality: N/A;  . ESOPHAGOGASTRODUODENOSCOPY (EGD) WITH PROPOFOL N/A 11/20/2016   Procedure: ESOPHAGOGASTRODUODENOSCOPY (EGD) WITH PROPOFOL;  Surgeon: Manya Silvas, MD;  Location: [facility] ENDOSCOPY;  Service: Endoscopy;  Laterality: N/A;  . ESOPHAGOGASTRODUODENOSCOPY (EGD) WITH PROPOFOL N/A 10/27/2017   Procedure: ESOPHAGOGASTRODUODENOSCOPY (EGD) WITH PROPOFOL;  Surgeon: Manya Silvas, MD;  Location: [facility] ENDOSCOPY;  Service: Endoscopy;  Laterality: N/A;  . LOWER EXTREMITY ANGIOGRAPHY Right 05/19/2017   Procedure: LOWER EXTREMITY ANGIOGRAPHY;  Surgeon: Algernon Huxley, MD;  Location: [facility] CV LAB;  Service: Cardiovascular;  Laterality: Right;  . LOWER EXTREMITY ANGIOGRAPHY Left 08/10/2018   Procedure: LOWER EXTREMITY ANGIOGRAPHY;  Surgeon: Algernon Huxley, MD;  Location: [place] CV LAB;  Service: Cardiovascular;  Laterality: Left;    SOCIAL HISTORY: Social History   Socioeconomic History  . Marital status: Married    Spouse name: [name]  . Number of children: 1  . Years of education: Not on file  . Highest education level: Not on  file   Occupational History  . 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 Financial resource strain: Not very hard  . Food insecurity    Worry: Never true    Inability: Never true  . Transportation needs    Medical: No    Non-medical: No  Tobacco Use  . Smoking status: Current Every Day Smoker    Packs/day: 0.50    Years: 47.00    Pack years: 23.50    Types: Cigarettes  . Smokeless tobacco: Never Used  Substance and Sexual Activity  . Alcohol use: Yes    Alcohol/week: 1.0 standard drinks    Types: 1 Glasses of wine per week    Comment:  almost none in last 6 months  . Drug use: No  . Sexual activity: Never    Partners: Female  Lifestyle  . Physical activity    Days per week: Not on file    Minutes per session: Not on file  . Stress: To some extent  Relationships  . Social Herbalist on phone: Once a week    Gets together: Once a week    Attends religious service: Not on file    Active member of club or organization: Not on file    Attends meetings of clubs or organizations: Not on file    Relationship status: Married  . Intimate partner violence    Fear of current or ex partner: No    Emotionally abused: No    Physically abused: No    Forced sexual activity: No  Other Topics Concern  . Not on file  Social History Narrative  . Not on file    FAMILY HISTORY: Family History  Problem Relation Age of Onset  . Hypertension Sister     ALLERGIES:  has No Known Allergies.  MEDICATIONS:  Current Facility-Administered Medications  Medication Dose Route Frequency Provider Last Rate Last Dose  . 0.9 %  sodium chloride infusion   Intravenous Continuous Vaughan Basta, MD 75 mL/hr at 12/29/18 1600    . aspirin EC tablet 81 mg  81 mg Oral Daily Vaughan Basta, MD   81 mg at 12/29/18 0803  . atorvastatin (LIPITOR) tablet 10 mg  10 mg Oral Daily Vaughan Basta, MD   10 mg at 12/28/18 2108  . clopidogrel (PLAVIX) tablet 75  mg  75 mg Oral Daily Vaughan Basta, MD   75 mg at 12/29/18 0803  . docusate sodium (COLACE) capsule 100 mg  100 mg Oral BID PRN Vaughan Basta, MD      . fluticasone furoate-vilanterol (BREO ELLIPTA) 100-25 MCG/INH 1 puff  1 puff Inhalation Daily Vaughan Basta, MD   1 puff at 12/29/18 0802  . heparin injection 5,000 Units  5,000 Units Subcutaneous Q8H Vaughan Basta, MD   5,000 Units at 12/29/18 1512  . Ibrutinib TABS 280 mg  280 mg Oral Daily Vaughan Basta, MD   280 mg at 12/29/18 0925  . insulin aspart (novoLOG) injection 0-5 Units  0-5 Units Subcutaneous QHS Vaughan Basta, MD      . insulin aspart (novoLOG) injection 0-9 Units  0-9 Units Subcutaneous TID WC Vaughan Basta, MD      . ipratropium-albuterol (DUONEB) 0.5-2.5 (3) MG/3ML nebulizer solution 3 mL  3 mL Nebulization Q6H Vaughan Basta, MD   3 mL at 12/29/18 1352  . iron polysaccharides (NIFEREX) capsule 150 mg  150 mg Oral Daily Vaughan Basta, MD   150 mg at 12/29/18  7371  . nicotine (NICODERM CQ - dosed in mg/24 hours) patch 21 mg  21 mg Transdermal Daily Seals, Angela H, NP   21 mg at 12/29/18 0802  . oxyCODONE-acetaminophen (PERCOCET/ROXICET) 5-325 MG per tablet 1 tablet  1 tablet Oral Q6H PRN Vaughan Basta, MD   1 tablet at 12/29/18 1512  . pantoprazole (PROTONIX) EC tablet 40 mg  40 mg Oral Daily Vaughan Basta, MD   40 mg at 12/29/18 0803  . sodium chloride flush (NS) 0.9 % injection 3 mL  3 mL Intravenous Once Lavonia Drafts, MD      . sucralfate (CARAFATE) tablet 1 g  1 g Oral TID WC & HS Vaughan Basta, MD   1 g at 12/29/18 1724      .  PHYSICAL EXAMINATION:  Vitals:   12/29/18 1353 12/29/18 1543  BP:  118/60  Pulse:  74  Resp:  18  Temp:  99.2 F (37.3 C)  SpO2: 100% 100%   Filed Weights   12/28/18 1006 12/28/18 1026  Weight: 145 lb (65.8 kg) 145 lb (65.8 kg)    Physical Exam  Constitutional: He is oriented to  person, place, and time and well-developed, well-nourished, and in no distress.  Frail-appearing male patient alone.  HENT:  Head: Normocephalic and atraumatic.  Mouth/Throat: Oropharynx is clear and moist. No oropharyngeal exudate.  Eyes: Pupils are equal, round, and reactive to light.  Neck: Normal range of motion. Neck supple.  Cardiovascular: Normal rate and regular rhythm.  Pulmonary/Chest: No respiratory distress. He has no wheezes.  Decreased air entry bilaterally.  Abdominal: Soft. Bowel sounds are normal. He exhibits no distension and no mass. There is no abdominal tenderness. There is no rebound and no guarding.  Musculoskeletal: Normal range of motion.        General: No tenderness or edema.  Neurological: He is alert and oriented to person, place, and time.  Skin: Skin is warm.  Psychiatric: Affect normal.     LABORATORY DATA:  I have reviewed the data as listed Lab Results  Component Value Date   WBC 13.3 (H) 12/29/2018   HGB 8.6 (L) 12/29/2018   HCT 26.8 (L) 12/29/2018   MCV 109.0 (H) 12/29/2018   PLT 105 (L) 12/29/2018   Recent Labs    05/14/18 0819 06/11/18 0938  08/20/18 1009  11/25/18 0844 12/28/18 1007 12/29/18 0440  NA 144 139   < > 140   < > 139 141 138  K 4.5 5.1   < > 5.3*   < > 6.0* 5.9* 4.8  CL 110 111   < > 109   < > 110 115* 115*  CO2 26 23   < > 25   < > 25 17* 17*  GLUCOSE 125* 116*   < > 118*   < > 94 110* 77  BUN 22 37*   < > 22   < > 20 56* 47*  CREATININE 1.35* 1.54*   < > 1.57*   < > 1.44* 2.28* 1.50*  CALCIUM 8.8* 8.5*   < > 8.5*   < > 8.7* 9.1 8.2*  GFRNONAA 53* 45*   < > 44*   < > 49* 28* 47*  GFRAA >60 53*   < > 51*   < > 57* 33* 54*  PROT 6.8 7.0  --  6.9  --   --   --   --   ALBUMIN 4.3 4.3  --  3.7  --   --   --   --  AST 12* 12*  --  12*  --   --   --   --   ALT 7 11  --  9  --   --   --   --   ALKPHOS 90 65  --  83  --   --   --   --   BILITOT 0.6 0.4  --  0.4  --   --   --   --    < > = values in this interval not  displayed.    RADIOGRAPHIC STUDIES: I have personally reviewed the radiological images as listed and agreed with the findings in the report. Dg Chest 2 View  Result Date: 12/28/2018 CLINICAL DATA:  Shortness of breath EXAM: CHEST - 2 VIEW COMPARISON:  CT chest, 01/20/2018 FINDINGS: The heart size and mediastinal contours are within normal limits. Both lungs are clear. Disc degenerative disease of the lower thoracic spine. IMPRESSION: No acute abnormality of the lungs. Electronically Signed   By: Eddie Candle M.D.   On: 12/28/2018 11:29    Macrocytic anemia 68 year old male patient history of CLL currently on ibrutinib is currently admitted to the hospital for chronic anemia worsening.  #Chronic macrocytic anemia-worsening; the etiology is unclear-recent bone marrow biopsy dyserythropoietic changes but inconclusive/NGS negative; no obvious evidence of CLL..  MDS versus CKD-currently on erythropoietin stimulating agent-no significant response.  Will check iron studies A56 folic acid LDH peripheral smear.  #Weight loss chronic abdominal pain-unclear etiology.  Recent CT scan abdomen pelvis-negative for obvious lymphadenopathy or splenomegaly.   #Shortness of breath-chronic multifactorial/COPD-worsening anemia.  Agree with PRBC transfusion/nebulization  #Leukocytosis-no obvious signs of infection/not on antibiotics.  Trending down by itself.  Check CBC with differential in the morning.  #Acute on chronic kidney disease-likely prerenal; IV fluids improving.  Thank you Dr.Shah for allowing me to participate in the care of your pleasant patient. Please do not hesitate to contact me with questions or concerns in the interim.  All questions were answered. The patient knows to call the clinic with any problems, questions or concerns.    Cammie Sickle, MD 12/29/2018 7:08 PM

## 2018-12-29 NOTE — Progress Notes (Addendum)
PHARMACY - PHYSICIAN COMMUNICATION CRITICAL VALUE ALERT - BLOOD CULTURE IDENTIFICATION (BCID)  James [name] is an 68 y.o. male who presented to [facility] on 12/28/2018 with a chief complaint of SOB  Assessment:  Patient admitted with new COPD and acute renal failure. BCx showing 1 of 4 GPR in anaerobic bottle. Patient is afebrile. WBC trended down from 27.9 >> 13.9 without antibiotics. Patient does have a PMH of CLL.   Name of physician (or Provider) Contacted: Dr. Manuella Ghazi   Current antibiotics: None   Changes to prescribed antibiotics recommended:  After discussion with provider no antibiotics will be started at this time. This most likely represents a contaminant.   Pernell Dupre, PharmD, BCPS Clinical Pharmacist 12/29/2018 7:38 AM

## 2018-12-29 NOTE — Progress Notes (Signed)
PHARMACY - PHYSICIAN COMMUNICATION CRITICAL VALUE ALERT - BLOOD CULTURE IDENTIFICATION (BCID)  James [name] is an 68 y.o. male who presented to [facility] on 12/28/2018 with a chief complaint of SOB  Assessment:  [name] in Lab called with 1 of 4 bottles reporting GPR, ANAEROBIC.  States will send samples to [facility] for evaluation.  Name of physician (or Provider) Contacted:   attempted to page and secure chat Dr Anselm Jungling.  Will also notify ID pharmacist for additional f/u  Current antibiotics: none currently  Changes to prescribed antibiotics recommended:  N/A, continue follow up w/ MD when available.    No results found for this or any previous visit.  [name] A 12/29/2018  6:42 AM

## 2018-12-30 LAB — CBC WITH DIFFERENTIAL/PLATELET
Abs Immature Granulocytes: 0.03 10*3/uL (ref 0.00–0.07)
Basophils Absolute: 0.1 10*3/uL (ref 0.0–0.1)
Basophils Relative: 1 %
Eosinophils Absolute: 0 10*3/uL (ref 0.0–0.5)
Eosinophils Relative: 0 %
HCT: 24.1 % — ABNORMAL LOW (ref 39.0–52.0)
Hemoglobin: 7.9 g/dL — ABNORMAL LOW (ref 13.0–17.0)
Immature Granulocytes: 0 %
Lymphocytes Relative: 67 %
Lymphs Abs: 7.4 10*3/uL — ABNORMAL HIGH (ref 0.7–4.0)
MCH: 33.5 pg (ref 26.0–34.0)
MCHC: 32.8 g/dL (ref 30.0–36.0)
MCV: 102.1 fL — ABNORMAL HIGH (ref 80.0–100.0)
Monocytes Absolute: 0.7 10*3/uL (ref 0.1–1.0)
Monocytes Relative: 7 %
Neutro Abs: 2.7 10*3/uL (ref 1.7–7.7)
Neutrophils Relative %: 25 %
Platelets: 84 10*3/uL — ABNORMAL LOW (ref 150–400)
RBC: 2.36 MIL/uL — ABNORMAL LOW (ref 4.22–5.81)
RDW: 19.9 % — ABNORMAL HIGH (ref 11.5–15.5)
Smear Review: NORMAL
WBC: 11 10*3/uL — ABNORMAL HIGH (ref 4.0–10.5)
nRBC: 0 % (ref 0.0–0.2)

## 2018-12-30 LAB — HIV ANTIBODY (ROUTINE TESTING W REFLEX): HIV Screen 4th Generation wRfx: NONREACTIVE

## 2018-12-30 LAB — VITAMIN B12: Vitamin B-12: 453 pg/mL (ref 180–914)

## 2018-12-30 LAB — GLUCOSE, CAPILLARY
Glucose-Capillary: 89 mg/dL (ref 70–99)
Glucose-Capillary: 124 mg/dL — ABNORMAL HIGH (ref 70–99)

## 2018-12-30 LAB — IRON AND TIBC
Iron: 224 ug/dL — ABNORMAL HIGH (ref 45–182)
Saturation Ratios: 86 % — ABNORMAL HIGH (ref 17.9–39.5)
TIBC: 261 ug/dL (ref 250–450)
UIBC: 37 ug/dL

## 2018-12-30 LAB — RETICULOCYTES
Immature Retic Fract: 11.3 % (ref 2.3–15.9)
RBC.: 2.36 MIL/uL — ABNORMAL LOW (ref 4.22–5.81)
Retic Count, Absolute: 18.9 10*3/uL — ABNORMAL LOW (ref 19.0–186.0)
Retic Ct Pct: 0.8 % (ref 0.4–3.1)

## 2018-12-30 LAB — PREPARE RBC (CROSSMATCH)

## 2018-12-30 LAB — LACTATE DEHYDROGENASE: LDH: 96 U/L — ABNORMAL LOW (ref 98–192)

## 2018-12-30 LAB — FERRITIN: Ferritin: 123 ng/mL (ref 24–336)

## 2018-12-30 LAB — FOLATE: Folate: 3.1 ng/mL — ABNORMAL LOW (ref >5.9)

## 2018-12-30 MED ORDER — DIPHENHYDRAMINE HCL 25 MG PO CAPS
25.0000 mg | ORAL_CAPSULE | Freq: Once | ORAL | Status: AC
  Administered 2018-12-30: 09:00:00 25 mg via ORAL
  Filled 2018-12-30: qty 1

## 2018-12-30 MED ORDER — FOLIC ACID 1 MG PO TABS
1.0000 mg | ORAL_TABLET | Freq: Every day | ORAL | Status: DC
  Administered 2018-12-30: 1 mg via ORAL
  Filled 2018-12-30: qty 1

## 2018-12-30 MED ORDER — ACETAMINOPHEN 325 MG PO TABS
650.0000 mg | ORAL_TABLET | Freq: Once | ORAL | Status: AC
  Administered 2018-12-30: 650 mg via ORAL
  Filled 2018-12-30: qty 2

## 2018-12-30 MED ORDER — SODIUM CHLORIDE 0.9% FLUSH: 10.0000 mL | INTRAVENOUS | Status: DC | PRN

## 2018-12-30 MED ORDER — HEPARIN SOD (PORK) LOCK FLUSH 100 UNIT/ML IV SOLN: 250.0000 [IU] | INTRAVENOUS | Status: DC | PRN

## 2018-12-30 MED ORDER — SODIUM CHLORIDE 0.9% IV SOLUTION
250.0000 mL | Freq: Once | INTRAVENOUS | Status: AC
  Administered 2018-12-30: 250 mL via INTRAVENOUS

## 2018-12-30 MED ORDER — HEPARIN SOD (PORK) LOCK FLUSH 100 UNIT/ML IV SOLN: 500.0000 [IU] | Freq: Every day | INTRAVENOUS | Status: DC | PRN

## 2018-12-30 MED ORDER — LISINOPRIL 10 MG PO TABS: 10.0000 mg | ORAL_TABLET | Freq: Every day | ORAL | 0 refills | Status: DC

## 2018-12-30 MED ORDER — SODIUM CHLORIDE 0.9% FLUSH: 3.0000 mL | INTRAVENOUS | Status: DC | PRN

## 2018-12-30 MED ORDER — FOLIC ACID 1 MG PO TABS: 1.0000 mg | ORAL_TABLET | Freq: Every day | ORAL | 1 refills | Status: DC

## 2018-12-30 NOTE — Progress Notes (Signed)
James [name]   DOB:1951-01-01   VE#:[id number]    Subjective: Continues to feel poorly.  Shortness of breath/ fatigue on exertion.  No issues with transfusion overnight.  Appetite is poor.  No fevers or chills nausea vomiting.  Objective:  Vitals:   12/30/18 1423 12/30/18 1459  BP:  132/62  Pulse:  64  Resp:  17  Temp:  98.9 F (37.2 C)  SpO2: 100% 100%    No intake or output data in the 24 hours ending 01/01/19 0810  Physical Exam  Constitutional: He is oriented to person, place, and time and well-developed, well-nourished, and in no distress.  Appears tired.  HENT:  Head: Normocephalic and atraumatic.  Mouth/Throat: Oropharynx is clear and moist. No oropharyngeal exudate.  Eyes: Pupils are equal, round, and reactive to light.  Neck: Normal range of motion. Neck supple.  Cardiovascular: Normal rate and regular rhythm.  Pulmonary/Chest: No respiratory distress. He has no wheezes.  Decreased air entry bilaterally.  Abdominal: Soft. Bowel sounds are normal. He exhibits no distension and no mass. There is no abdominal tenderness. There is no rebound and no guarding.  Musculoskeletal: Normal range of motion.        General: No tenderness or edema.  Neurological: He is alert and oriented to person, place, and time.  Skin: Skin is warm.  Psychiatric: Affect normal.     Labs:  Lab Results  Component Value Date   WBC 11.0 (H) 12/30/2018   HGB 7.9 (L) 12/30/2018   HCT 24.1 (L) 12/30/2018   MCV 102.1 (H) 12/30/2018   PLT 84 (L) 12/30/2018   NEUTROABS 2.7 12/30/2018    Lab Results  Component Value Date   NA 138 12/29/2018   K 4.8 12/29/2018   CL 115 (H) 12/29/2018   CO2 17 (L) 12/29/2018    Studies:  No results found.  Macrocytic anemia 68 year old male patient history of CLL currently on ibrutinib is currently admitted to the hospital for chronic anemia worsening.  #Chronic macrocytic anemia-worsening; the etiology is unclear-recent bone marrow biopsy  dyserythropoietic changes but inconclusive/NGS negative; no obvious evidence of CLL.  Hemoglobin 7.8 post 1 unit of PRBC transfusion.  Recommend 1 more unit of transfusion today.  Folic acid deficiency noted.  Recommend folic acid once a day.   #Shortness of breath-chronic multifactorial/COPD-worsening anemia.  Agree with PRBC transfusion/nebulization stable.  See above.  #Leukocytosis-improved.  Blood cultures likely contaminant.  #Acute on chronic kidney disease-likely prerenal; improving.  Discussed with Dr. Posey Pronto.  Patient stable for discharge from hematology standpoint.  Follow-up as planned next week.     Cammie Sickle, MD 01/01/2019  8:10 AM

## 2018-12-30 NOTE — Discharge Summary (Signed)
[place] [facility] [place] NAME: James [name]    MR#:  [id number]  DATE OF BIRTH:  01/07/1951  DATE OF ADMISSION:  12/28/2018 ADMITTING PHYSICIAN: Vaughan Basta, MD  DATE OF DISCHARGE: 12/30/2018  PRIMARY CARE PHYSICIAN: [name], MD    ADMISSION DIAGNOSIS:  Shortness of breath [R06.02] Acute kidney injury ([place]) [N17.9]  DISCHARGE DIAGNOSIS:  acute on chronic anemia suspected due to CLL  SECONDARY DIAGNOSIS:   Past Medical History:  Diagnosis Date  . CLL (chronic lymphocytic leukemia) ([place])   . Diabetes mellitus without complication ([place])   . Hematuria   . Hypertension     HOSPITAL COURSE:  68 y.o.malewith a known history of CLL, diabetes, hematuria, hypertension being admitted for shortness of breath  *Shortness of breath-likely due to acute on chronic anemia  Patient is a smoker for more than 45 years. He was never diagnosed with COPD. There is CT scan of the chest last year shows emphysematous changes. - echocardiogram shows normal LV systolic function.  There is some diastolic dysfunction. -Continue nebulizer therapy and he was counseled to quit smoking on admission. -Consider long-acting inhalers on discharge.  * Anemia of chronic disease/chronic macrocytic anemia -His hemoglobin is down to 7.5 (10.1)-- status post 2 unit blood transfusion.  He denies any obvious bleeding -patient will get his second unit today prior to discharge -will get follow-up CBC next week when he goes to see Dr. Rogue Bussing in his routine appointment  *Acute on CKD stage III -Creatinine improving with hydration - Avoid nephrotoxic medication. - hold most of his antihypertensive medications. -Received  IV hydration   *Hyperkalemia: Resolved Due to worsening renal function -Status post Veltassa 1 dose, calcium gluconate 1 dose, albuterol inhalers. Monitor kidney function and potassium closely. -K  4.8  *Hypertension creatinine much better after IV fluids and IV blood transfusion. Will resume home meds including lisinopril and have patient follow-up primary care physician is outpatient to monitor creatinine closely -I will decrease the dose of lisinopril to 10 mg.  *Gastroesophageal reflux disease Continue PPI and sucralfate.  *Diabetes mellitus type 2 Continue metformin and keep on sliding scale coverage.  * CLL On oral antineoplastic medication and follows with Dr. Jacinto Reap as outpatient. -Consult oncology with Dr. Rogue Bussing appreciated  *Active smoking Counseled to quit smoking for 4 minutes and offered nicotine patch.  Pt  will discharged to home after blood transfusion today. CONSULTS OBTAINED:    DRUG ALLERGIES:  No Known Allergies  DISCHARGE MEDICATIONS:   Allergies as of 12/30/2018   No Known Allergies     Medication List    TAKE these medications   amLODipine 10 MG tablet Commonly known as: NORVASC Take 1 tablet by mouth once daily   aspirin EC 81 MG tablet Take 81 mg by mouth daily.   atorvastatin 10 MG tablet Commonly known as: Lipitor Take 1 tablet (10 mg total) by mouth daily.   clopidogrel 75 MG tablet Commonly known as: PLAVIX Take 1 tablet by mouth once daily   dicyclomine 10 MG capsule Commonly known as: BENTYL Take 10 mg by mouth 4 (four) times daily -  before meals and at bedtime.   folic acid 1 MG tablet Commonly known as: FOLVITE Take 1 tablet (1 mg total) by mouth daily. Start taking on: December 31, 2018   Imbruvica 280 MG Tabs Generic drug: Ibrutinib TAKE 1 TABLET BY MOUTH DAILY. What changed: how much to take   lisinopril 10 MG tablet Commonly  known as: ZESTRIL Take 1 tablet (10 mg total) by mouth daily. What changed:   medication strength  how much to take   metFORMIN 1000 MG tablet Commonly known as: GLUCOPHAGE Take 1,000 mg by mouth 2 (two) times daily.   metoprolol tartrate 25 MG tablet Commonly known as:  LOPRESSOR Take 1 tablet by mouth twice daily   Nu-Iron 150 MG capsule Generic drug: iron polysaccharides Take 150 mg by mouth daily.   omeprazole 40 MG capsule Commonly known as: PRILOSEC Take 40 mg by mouth daily.   oxyCODONE-acetaminophen 5-325 MG tablet Commonly known as: PERCOCET/ROXICET 1 pill every 8-12 hours   SPS 15 GM/60ML suspension Generic drug: sodium polystyrene TAKE AS DIRECTED (30 GRAMS) FOR 1 DOSE   sucralfate 1 g tablet Commonly known as: CARAFATE DISSOLVE 1 TABLET IN WATER AS DIRECTED BY YOUR DOCTOR AND DRINK BY MOUTH 4 TIMES DAILY (BEFORE MEALS AND NIGHTLY)       If you experience worsening of your admission symptoms, develop shortness of breath, life threatening emergency, suicidal or homicidal thoughts you must seek medical attention immediately by calling 911 or calling your MD immediately  if symptoms less severe.  You Must read complete instructions/literature along with all the possible adverse reactions/side effects for all the Medicines you take and that have been prescribed to you. Take any new Medicines after you have completely understood and accept all the possible adverse reactions/side effects.   Please note  You were cared for by a hospitalist during your hospital stay. If you have any questions about your discharge medications or the care you received while you were in the hospital after you are discharged, you can call the unit and asked to speak with the hospitalist on call if the hospitalist that took care of you is not available. Once you are discharged, your primary care physician will handle any further medical issues. Please note that NO REFILLS for any discharge medications will be authorized once you are discharged, as it is imperative that you return to your primary care physician (or establish a relationship with a primary care physician if you do not have one) for your aftercare needs so that they can reassess your need for medications  and monitor your lab values. Today   SUBJECTIVE   Overall feels better. Oxygen saturation hundred percent on room air  VITAL SIGNS:  Blood pressure 133/70, pulse 70, temperature 97.6 F (36.4 C), temperature source Oral, resp. rate 16, height 5\' 8"  (1.727 m), weight 65.8 kg, SpO2 100 %.  I/O:    Intake/Output Summary (Last 24 hours) at 12/30/2018 1031 Last data filed at 12/30/2018 0900 Gross per 24 hour  Intake 2210.85 ml  Output -  Net 2210.85 ml    PHYSICAL EXAMINATION:  GENERAL:  68 y.o.-year-old patient lying in the bed with no acute distress.  EYES: Pupils equal, round, reactive to light and accommodation. No scleral icterus. Extraocular muscles intact.  HEENT: Head atraumatic, normocephalic. Oropharynx and nasopharynx clear.  NECK:  Supple, no jugular venous distention. No thyroid enlargement, no tenderness.  LUNGS: Normal breath sounds bilaterally, no wheezing, rales,rhonchi or crepitation. No use of accessory muscles of respiration.  CARDIOVASCULAR: S1, S2 normal. No murmurs, rubs, or gallops.  ABDOMEN: Soft, non-tender, non-distended. Bowel sounds present. No organomegaly or mass.  EXTREMITIES: No pedal edema, cyanosis, or clubbing.  NEUROLOGIC: Cranial nerves II through XII are intact. Muscle strength 5/5 in all extremities. Sensation intact. Gait not checked. Globally weak PSYCHIATRIC: The patient is alert and  oriented x 3.  SKIN: No obvious rash, lesion, or ulcer.   DATA REVIEW:   CBC  Recent Labs  Lab 12/30/18 0448  WBC 11.0*  HGB 7.9*  HCT 24.1*  PLT 84*    Chemistries  Recent Labs  Lab 12/29/18 0440  NA 138  K 4.8  CL 115*  CO2 17*  GLUCOSE 77  BUN 47*  CREATININE 1.50*  CALCIUM 8.2*    Microbiology Results   Recent Results (from the past 240 hour(s))  SARS Coronavirus 2 [facility] order, Performed in [facility] hospital lab) Nasopharyngeal Nasopharyngeal Swab     Status: None   Collection Time: 12/28/18 12:29 PM   Specimen: Nasopharyngeal  Swab  Result Value Ref Range Status   SARS Coronavirus 2 NEGATIVE NEGATIVE Final    Comment: (NOTE) If result is NEGATIVE SARS-CoV-2 target nucleic acids are NOT DETECTED. The SARS-CoV-2 RNA is generally detectable in upper and lower  respiratory specimens during the acute phase of infection. The lowest  concentration of SARS-CoV-2 viral copies this assay can detect is 250  copies / mL. A negative result does not preclude SARS-CoV-2 infection  and should not be used as the sole basis for treatment or other  patient management decisions.  A negative result may occur with  improper specimen collection / handling, submission of specimen other  than nasopharyngeal swab, presence of viral mutation(s) within the  areas targeted by this assay, and inadequate number of viral copies  (<250 copies / mL). A negative result must be combined with clinical  observations, patient history, and epidemiological information. If result is POSITIVE SARS-CoV-2 target nucleic acids are DETECTED. The SARS-CoV-2 RNA is generally detectable in upper and lower  respiratory specimens dur ing the acute phase of infection.  Positive  results are indicative of active infection with SARS-CoV-2.  Clinical  correlation with patient history and other diagnostic information is  necessary to determine patient infection status.  Positive results do  not rule out bacterial infection or co-infection with other viruses. If result is PRESUMPTIVE POSTIVE SARS-CoV-2 nucleic acids MAY BE PRESENT.   A presumptive positive result was obtained on the submitted specimen  and confirmed on repeat testing.  While 2019 novel coronavirus  (SARS-CoV-2) nucleic acids may be present in the submitted sample  additional confirmatory testing may be necessary for epidemiological  and / or clinical management purposes  to differentiate between  SARS-CoV-2 and other Sarbecovirus currently known to infect humans.  If clinically indicated  additional testing with an alternate test  methodology [phone]) is advised. The SARS-CoV-2 RNA is generally  detectable in upper and lower respiratory sp ecimens during the acute  phase of infection. The expected result is Negative. Fact Sheet for Patients:  StrictlyIdeas.no Fact Sheet for Healthcare Providers: BankingDealers.co.za This test is not yet approved or cleared by the [place] FDA and has been authorized for detection and/or diagnosis of SARS-CoV-2 by FDA under an Emergency Use Authorization (EUA).  This EUA will remain in effect (meaning this test can be used) for the duration of the COVID-19 declaration under Section 564(b)(1) of the Act, 21 U.S.C. section 360bbb-3(b)(1), unless the authorization is terminated or revoked sooner. Performed at [facility], [address] [place], [place] [address]   Blood culture (routine x 2)     Status: Abnormal (Preliminary result)   Collection Time: 12/28/18  2:24 PM   Specimen: BLOOD  Result Value Ref Range Status   Specimen Description   Final  BLOOD RAC Performed at [facility], [address] [place], [place] [address]    Special Requests   Final    BLOOD Blood Culture adequate volume Performed at [facility], [address] [place], [place] [address]    Culture  Setup Time   Final    GRAM POSITIVE RODS ANAEROBIC BOTTLE ONLY GRAM POSITIVE COCCI AEROBIC BOTTLE ONLY CRITICAL RESULT CALLED TO, READ BACK BY AND VERIFIED WITH: [place] [facility] [place] 12/29/2018 [place] Organism ID to follow CRITICAL RESULT CALLED TO, READ BACK BY AND VERIFIED WITH: Eleonore Chiquito 12/29/18 1216 KLW Performed at [facility], [address] [place], [place] [address]    Culture (A)  Final    BACILLUS SPECIES Standardized susceptibility testing for this organism is not available. STAPHYLOCOCCUS SPECIES (COAGULASE NEGATIVE) THE SIGNIFICANCE OF ISOLATING THIS  ORGANISM FROM A SINGLE SET OF BLOOD CULTURES WHEN MULTIPLE SETS ARE DRAWN IS UNCERTAIN. PLEASE NOTIFY THE MICROBIOLOGY DEPARTMENT WITHIN ONE WEEK IF SPECIATION AND SENSITIVITIES ARE REQUIRED. Performed at [facility], [place] [address], [place], [place] [address]    Report Status PENDING  Incomplete  Blood Culture ID Panel (Reflexed)     Status: Abnormal   Collection Time: 12/28/18  2:24 PM  Result Value Ref Range Status   Enterococcus species NOT DETECTED NOT DETECTED Final   Listeria monocytogenes NOT DETECTED NOT DETECTED Final   Staphylococcus species DETECTED (A) NOT DETECTED Final    Comment: Methicillin (oxacillin) resistant coagulase negative staphylococcus. Possible blood culture contaminant (unless isolated from more than one blood culture draw or clinical case suggests pathogenicity). No antibiotic treatment is indicated for blood  culture contaminants. CRITICAL RESULT CALLED TO, READ BACK BY AND VERIFIED WITH: Winfield Rast Stanislawa Gaffin 12/29/18 1216 KLW    Staphylococcus aureus (BCID) NOT DETECTED NOT DETECTED Final   Methicillin resistance DETECTED (A) NOT DETECTED Final    Comment: CRITICAL RESULT CALLED TO, READ BACK BY AND VERIFIED WITH: Winfield Rast Jazzlene Huot 12/29/18 1216 KLW    Streptococcus species NOT DETECTED NOT DETECTED Final   Streptococcus agalactiae NOT DETECTED NOT DETECTED Final   Streptococcus pneumoniae NOT DETECTED NOT DETECTED Final   Streptococcus pyogenes NOT DETECTED NOT DETECTED Final   Acinetobacter baumannii NOT DETECTED NOT DETECTED Final   Enterobacteriaceae species NOT DETECTED NOT DETECTED Final   Enterobacter cloacae complex NOT DETECTED NOT DETECTED Final   Escherichia coli NOT DETECTED NOT DETECTED Final   Klebsiella oxytoca NOT DETECTED NOT DETECTED Final   Klebsiella pneumoniae NOT DETECTED NOT DETECTED Final   Proteus species NOT DETECTED NOT DETECTED Final   Serratia marcescens NOT DETECTED NOT DETECTED Final   Haemophilus influenzae NOT DETECTED NOT  DETECTED Final   Neisseria meningitidis NOT DETECTED NOT DETECTED Final   Pseudomonas aeruginosa NOT DETECTED NOT DETECTED Final   Candida albicans NOT DETECTED NOT DETECTED Final   Candida glabrata NOT DETECTED NOT DETECTED Final   Candida krusei NOT DETECTED NOT DETECTED Final   Candida parapsilosis NOT DETECTED NOT DETECTED Final   Candida tropicalis NOT DETECTED NOT DETECTED Final    Comment: Performed at [facility], [address] [place], [place] [address]  Blood culture (routine x 2)     Status: None (Preliminary result)   Collection Time: 12/28/18  2:25 PM   Specimen: BLOOD  Result Value Ref Range Status   Specimen Description BLOOD LAC  Final   Special Requests   Final    BOTTLES DRAWN AEROBIC AND ANAEROBIC Blood Culture adequate volume  Culture   Final    NO GROWTH 2 DAYS Performed at [facility], [address] [place], [place] [address]    Report Status PENDING  Incomplete    RADIOLOGY:  Dg Chest 2 View  Result Date: 12/28/2018 CLINICAL DATA:  Shortness of breath EXAM: CHEST - 2 VIEW COMPARISON:  CT chest, 01/20/2018 FINDINGS: The heart size and mediastinal contours are within normal limits. Both lungs are clear. Disc degenerative disease of the lower thoracic spine. IMPRESSION: No acute abnormality of the lungs. Electronically Signed   By: Eddie Candle M.D.   On: 12/28/2018 11:29     CODE STATUS:     Code Status Orders  (From admission, onward)         Start     Ordered   12/28/18 1825  Do not attempt resuscitation (DNR)  Continuous    Question Answer Comment  In the event of cardiac or respiratory ARREST Do not call a "code blue"   In the event of cardiac or respiratory ARREST Do not perform Intubation, CPR, defibrillation or ACLS   In the event of cardiac or respiratory ARREST Use medication by any route, position, wound care, and other measures to relive pain and suffering. May use oxygen, suction and manual treatment of airway  obstruction as needed for comfort.      12/28/18 1824        Code Status History    Date Active Date Inactive Code Status Order ID Comments User Context   08/10/2018 1126 08/10/2018 1625 Full Code [id number]  Algernon Huxley, MD Inpatient   05/19/2017 1435 05/19/2017 1903 Full Code [id number]  Algernon Huxley, MD Inpatient   Advance Care Planning Activity      TOTAL TIME TAKING CARE OF THIS PATIENT: *40 minutes.    [name] M.D on 12/30/2018 at 10:31 AM  Between 7am to 6pm - Pager - [phone] After 6pm go to [email or web address] - password [facility]  Office  [phone]  CC: Primary care physician; [name], MD

## 2018-12-30 NOTE — Progress Notes (Signed)
Received MD order to discharge patient to home, reviewed home meds , discharge instructions and follow up appointments with patent and patient verbalized understanding

## 2018-12-31 LAB — CULTURE, BLOOD (ROUTINE X 2): Special Requests: ADEQUATE

## 2018-12-31 LAB — TYPE AND SCREEN
ABO/RH(D): A POS
Antibody Screen: NEGATIVE
Unit division: 0
Unit division: 0

## 2018-12-31 LAB — BPAM RBC
Blood Product Expiration Date: 202009122359
Blood Product Expiration Date: 202009152359
ISSUE DATE / TIME: 202009011218
ISSUE DATE / TIME: 202009021129
Unit Type and Rh: 600
Unit Type and Rh: 6200

## 2018-12-31 LAB — HAPTOGLOBIN: Haptoglobin: 47 mg/dL (ref 32–363)

## 2019-01-01 LAB — COPPER, SERUM: Copper: 51 ug/dL — ABNORMAL LOW (ref 72–166)

## 2019-01-01 LAB — ZINC: Zinc: 72 ug/dL (ref 56–134)

## 2019-01-02 LAB — CULTURE, BLOOD (ROUTINE X 2)
Culture: NO GROWTH
Special Requests: ADEQUATE

## 2019-01-05 NOTE — Progress Notes (Signed)
Patient is coming in for follow up, he was in the hospital last week.

## 2019-01-06 ENCOUNTER — Encounter: Payer: Self-pay | Admitting: Internal Medicine

## 2019-01-06 ENCOUNTER — Inpatient Hospital Stay (HOSPITAL_BASED_OUTPATIENT_CLINIC_OR_DEPARTMENT_OTHER): Payer: Medicare Other | Admitting: Internal Medicine

## 2019-01-06 ENCOUNTER — Other Ambulatory Visit: Payer: Self-pay

## 2019-01-06 ENCOUNTER — Inpatient Hospital Stay: Payer: Medicare Other | Attending: Internal Medicine

## 2019-01-06 ENCOUNTER — Inpatient Hospital Stay: Payer: Medicare Other

## 2019-01-06 DIAGNOSIS — E610 Copper deficiency: Secondary | ICD-10-CM | POA: Insufficient documentation

## 2019-01-06 DIAGNOSIS — D631 Anemia in chronic kidney disease: Secondary | ICD-10-CM

## 2019-01-06 DIAGNOSIS — C9110 Chronic lymphocytic leukemia of B-cell type not having achieved remission: Secondary | ICD-10-CM | POA: Insufficient documentation

## 2019-01-06 DIAGNOSIS — I129 Hypertensive chronic kidney disease with stage 1 through stage 4 chronic kidney disease, or unspecified chronic kidney disease: Secondary | ICD-10-CM | POA: Insufficient documentation

## 2019-01-06 DIAGNOSIS — I70219 Atherosclerosis of native arteries of extremities with intermittent claudication, unspecified extremity: Secondary | ICD-10-CM | POA: Diagnosis not present

## 2019-01-06 DIAGNOSIS — N189 Chronic kidney disease, unspecified: Secondary | ICD-10-CM | POA: Diagnosis not present

## 2019-01-06 DIAGNOSIS — R109 Unspecified abdominal pain: Secondary | ICD-10-CM | POA: Insufficient documentation

## 2019-01-06 DIAGNOSIS — C9112 Chronic lymphocytic leukemia of B-cell type in relapse: Secondary | ICD-10-CM

## 2019-01-06 DIAGNOSIS — G8929 Other chronic pain: Secondary | ICD-10-CM | POA: Insufficient documentation

## 2019-01-06 DIAGNOSIS — C9192 Lymphoid leukemia, unspecified, in relapse: Secondary | ICD-10-CM

## 2019-01-06 DIAGNOSIS — F17210 Nicotine dependence, cigarettes, uncomplicated: Secondary | ICD-10-CM | POA: Diagnosis not present

## 2019-01-06 DIAGNOSIS — Z79899 Other long term (current) drug therapy: Secondary | ICD-10-CM | POA: Insufficient documentation

## 2019-01-06 DIAGNOSIS — N183 Chronic kidney disease, stage 3 unspecified: Secondary | ICD-10-CM

## 2019-01-06 LAB — CBC WITH DIFFERENTIAL/PLATELET
Abs Immature Granulocytes: 0.02 10*3/uL (ref 0.00–0.07)
Basophils Absolute: 0.2 10*3/uL — ABNORMAL HIGH (ref 0.0–0.1)
Basophils Relative: 2 %
Eosinophils Absolute: 0.1 10*3/uL (ref 0.0–0.5)
Eosinophils Relative: 1 %
HCT: 28.5 % — ABNORMAL LOW (ref 39.0–52.0)
Hemoglobin: 8.9 g/dL — ABNORMAL LOW (ref 13.0–17.0)
Immature Granulocytes: 0 %
Lymphocytes Relative: 65 %
Lymphs Abs: 7.3 10*3/uL — ABNORMAL HIGH (ref 0.7–4.0)
MCH: 32.7 pg (ref 26.0–34.0)
MCHC: 31.2 g/dL (ref 30.0–36.0)
MCV: 104.8 fL — ABNORMAL HIGH (ref 80.0–100.0)
Monocytes Absolute: 0.8 10*3/uL (ref 0.1–1.0)
Monocytes Relative: 7 %
Neutro Abs: 2.8 10*3/uL (ref 1.7–7.7)
Neutrophils Relative %: 25 %
Platelets: 102 10*3/uL — ABNORMAL LOW (ref 150–400)
RBC: 2.72 MIL/uL — ABNORMAL LOW (ref 4.22–5.81)
RDW: 17.5 % — ABNORMAL HIGH (ref 11.5–15.5)
Smear Review: NORMAL
WBC: 11.2 10*3/uL — ABNORMAL HIGH (ref 4.0–10.5)
nRBC: 0 % (ref 0.0–0.2)

## 2019-01-06 LAB — BASIC METABOLIC PANEL
Anion gap: 8 (ref 5–15)
BUN: 18 mg/dL (ref 8–23)
CO2: 22 mmol/L (ref 22–32)
Calcium: 8.7 mg/dL — ABNORMAL LOW (ref 8.9–10.3)
Chloride: 114 mmol/L — ABNORMAL HIGH (ref 98–111)
Creatinine, Ser: 1.07 mg/dL (ref 0.61–1.24)
GFR calc Af Amer: >60 mL/min (ref >60)
GFR calc non Af Amer: >60 mL/min (ref >60)
Glucose, Bld: 90 mg/dL (ref 70–99)
Potassium: 4.5 mmol/L (ref 3.5–5.1)
Sodium: 144 mmol/L (ref 135–145)

## 2019-01-06 LAB — LACTATE DEHYDROGENASE: LDH: 141 U/L (ref 98–192)

## 2019-01-06 MED ORDER — DARBEPOETIN ALFA 300 MCG/0.6ML IJ SOSY
300.0000 ug | PREFILLED_SYRINGE | Freq: Once | INTRAMUSCULAR | Status: AC
  Administered 2019-01-06: 300 ug via SUBCUTANEOUS
  Filled 2019-01-06: qty 0.6

## 2019-01-06 NOTE — Assessment & Plan Note (Addendum)
#   CLL/SLL- relapsed most recently on ibrutinib; July 2020- CT Ab/Pelvis- CT scan shows no progressive/worsening lymphadenopathy.  Currently held ibrutinib because of progressive anemia [see discussion below]  # #Worsening anemia-hemoglobin 7.5 needing to units of PRBC transfusion in the hospital; today hemoglobin is 8.9.  Second to CKD/CLL;  copper deficiency/folic acid deficiency noted.  Recommend proceeding with Aranesp today.  Iron stores normal.  Recommend copper 4 mg a day/folic acid 2 mg a day  # Constipation- recommend miralax prn.   # Abdominal pain/dyspepsia/unclear etiology; stable continue Percocet for now. S/p GI eval at [facility]; awaiting hepatology evaluation; [place]- normal; July 2020- CT no acute process.   # DISPOSITION:  # aranesp today; # in 3 weeks- MD-cbc/bmp--possible aranesp-Dr.B

## 2019-01-06 NOTE — Patient Instructions (Signed)
#  Copper supplement [2 mg pills ; over-the-counter] take 2 pills once a day.  #Folic acid [1 mg pills]; take 2 pills a day.  #Continue to hold ibrutinib at this time.

## 2019-01-06 NOTE — Progress Notes (Signed)
[facility] [place] OFFICE PROGRESS NOTE  Patient Care Team: [name], MD as PCP - General (Internal Medicine)  Cancer Staging No matching staging information was found for the patient.   Oncology History Overview Note  # 2006- CLL STAGE IV; MAY 2011- WBC- 57K;Platelets-99;Hb-12/CT Bulky LN; BMBx- 80% Invol; del 11; START Benda-Ritux x4 [finished Sep 2011];   # July 2015-Progression; Sep 2015-START ibrutinib; CT scan DEC 2015- Improvement LN; Cont Ibrutinib '140mg'$ /d; NOV 2016 CT- 1-2CM LN [mild progression compared to Dec 2015];NOV 2016- FISH peripheral blood- NO MUTATIONS/CD-38 Positive; NOV 7th- CONT IBRUTINIB 2 pills/day; CT AUG 2017- STABLE;  DEC 6th PET- Mild RP LN/ Retrocrural LN  # OFF ibrutinib [? intol]- sep 2019- Jan 16th 2020; Re-start Ibrutinib; September 2020-stop ibrutinib [poor tolerance/worsening anemia]  # Jan 16th 2020- start aranesp  # DEC 2019- PAIN CONTRACT  #October 2019-bone marrow biopsy [worsening anemia]-question dyserythropoietic changes/small clone of CLL;  # [facility] HEM- NEG.  SA skin infection [Oct 2016] s/p clinda -------------------------------------------------------    DIAGNOSIS: CLL  STAGE:   IV      ;GOALS: palliative  CURRENT/MOST RECENT THERAPY : Ibrutinib.    CLL (chronic lymphocytic leukemia) (HCC)    INTERVAL HISTORY:  James [name] 68 y.o.  male pleasant patient above history of CLL [currently off ibrutinib] and chronic abdominal pain of unclear etiology; worsening anemia of unclear etiology/? CKD is here for follow-up.  In the interim patient was admitted to hospital for worsening fatigue/symptomatic anemia hemoglobin 7.5.  Patient received 2 units of PRBC transfusion.  Patient was also noted to have low folic acid currently on folic acid 1 mg a day.  Patient feels slightly better since admission.  Denies any nausea vomiting.  Chronic abdominal pain not any worse.  Positive for  constipation  Review of Systems  Constitutional: Positive for malaise/fatigue and weight loss. Negative for chills, diaphoresis and fever.  HENT: Negative for nosebleeds and sore throat.   Eyes: Negative for double vision.  Respiratory: Negative for cough, hemoptysis, sputum production, shortness of breath and wheezing.   Cardiovascular: Negative for chest pain, palpitations, orthopnea and leg swelling.  Gastrointestinal: Positive for abdominal pain, constipation and nausea. Negative for blood in stool, diarrhea, heartburn, melena and vomiting.  Genitourinary: Negative for dysuria, frequency and urgency.  Skin: Negative.  Negative for itching and rash.  Neurological: Negative for dizziness, tingling, focal weakness, weakness and headaches.  Endo/Heme/Allergies: Does not bruise/bleed easily.  Psychiatric/Behavioral: Negative for depression. The patient is not nervous/anxious and does not have insomnia.       PAST MEDICAL HISTORY :  Past Medical History:  Diagnosis Date  . CLL (chronic lymphocytic leukemia) ([place])   . Diabetes mellitus without complication ([place])   . Hematuria   . Hypertension     PAST SURGICAL HISTORY :   Past Surgical History:  Procedure Laterality Date  . CATARACT EXTRACTION W/ INTRAOCULAR LENS IMPLANT Bilateral   . CHOLECYSTECTOMY  1983  . COLONOSCOPY WITH PROPOFOL N/A 10/27/2017   Procedure: COLONOSCOPY WITH PROPOFOL;  Surgeon: Manya Silvas, MD;  Location[facility] ENDOSCOPY;  Service: Endoscopy;  Laterality: N/A;  . ESOPHAGOGASTRODUODENOSCOPY (EGD) WITH PROPOFOL N/A 11/20/2016   Procedure: ESOPHAGOGASTRODUODENOSCOPY (EGD) WITH PROPOFOL;  Surgeon: Manya Silvas, MD;  Location: [facility] ENDOSCOPY;  Service: Endoscopy;  Laterality: N/A;  . ESOPHAGOGASTRODUODENOSCOPY (EGD) WITH PROPOFOL N/A 10/27/2017   Procedure: ESOPHAGOGASTRODUODENOSCOPY (EGD) WITH PROPOFOL;  Surgeon: Manya Silvas, MD;  Location: [facility] ENDOSCOPY;  Service: Endoscopy;  Laterality: N/A;  .  LOWER  EXTREMITY ANGIOGRAPHY Right 05/19/2017   Procedure: LOWER EXTREMITY ANGIOGRAPHY;  Surgeon: Algernon Huxley, MD;  Location: [place] CV LAB;  Service: Cardiovascular;  Laterality: Right;  . LOWER EXTREMITY ANGIOGRAPHY Left 08/10/2018   Procedure: LOWER EXTREMITY ANGIOGRAPHY;  Surgeon: Algernon Huxley, MD;  Location: [place] CV LAB;  Service: Cardiovascular;  Laterality: Left;    FAMILY HISTORY :   Family History  Problem Relation Age of Onset  . Hypertension Sister     SOCIAL HISTORY:   Social History   Tobacco Use  . Smoking status: Current Every Day Smoker    Packs/day: 0.50    Years: 47.00    Pack years: 23.50    Types: Cigarettes  . Smokeless tobacco: Never Used  Substance Use Topics  . Alcohol use: Yes    Alcohol/week: 1.0 standard drinks    Types: 1 Glasses of wine per week    Comment:  almost none in last 6 months  . Drug use: No    ALLERGIES:  has No Known Allergies.  MEDICATIONS:  Current Outpatient Medications  Medication Sig Dispense Refill  . amLODipine (NORVASC) 10 MG tablet Take 1 tablet by mouth once daily 30 tablet 4  . aspirin EC 81 MG tablet Take 81 mg by mouth daily.    Marland Kitchen atorvastatin (LIPITOR) 10 MG tablet Take 1 tablet (10 mg total) by mouth daily. 30 tablet 11  . clopidogrel (PLAVIX) 75 MG tablet Take 1 tablet by mouth once daily 90 tablet 0  . dicyclomine (BENTYL) 10 MG capsule Take 10 mg by mouth 4 (four) times daily -  before meals and at bedtime.    . folic acid (FOLVITE) 1 MG tablet Take 1 tablet (1 mg total) by mouth daily. 30 tablet 1  . IMBRUVICA 280 MG TABS TAKE 1 TABLET BY MOUTH DAILY. (Patient taking differently: Take 280 mg by mouth daily. ) 28 tablet 6  . lisinopril (ZESTRIL) 10 MG tablet Take 1 tablet (10 mg total) by mouth daily. 30 tablet 0  . metFORMIN (GLUCOPHAGE) 1000 MG tablet Take 1,000 mg by mouth 2 (two) times daily.    . metoprolol tartrate (LOPRESSOR) 25 MG tablet Take 1 tablet by mouth twice daily 180 tablet 0  .  omeprazole (PRILOSEC) 40 MG capsule Take 40 mg by mouth daily.     Marland Kitchen oxyCODONE-acetaminophen (PERCOCET/ROXICET) 5-325 MG tablet 1 pill every 8-12 hours 90 tablet 0  . sodium polystyrene (SPS) 15 GM/60ML suspension TAKE AS DIRECTED (30 GRAMS) FOR 1 DOSE    . sucralfate (CARAFATE) 1 g tablet DISSOLVE 1 TABLET IN WATER AS DIRECTED BY YOUR DOCTOR AND DRINK BY MOUTH 4 TIMES DAILY (BEFORE MEALS AND NIGHTLY)    . iron polysaccharides (NU-IRON) 150 MG capsule Take 150 mg by mouth daily.      No current facility-administered medications for this visit.     PHYSICAL EXAMINATION: ECOG PERFORMANCE STATUS: 1 - Symptomatic but completely ambulatory  BP (!) 159/85 (BP Location: Right Arm, Patient Position: Sitting)   Pulse 63   Temp (!) 97.3 F (36.3 C) (Tympanic)   Ht 5' 8"  (1.727 m)   Wt 156 lb (70.8 kg)   BMI 23.72 kg/m   Filed Weights   01/06/19 1021  Weight: 156 lb (70.8 kg)    Physical Exam  Constitutional: He is oriented to person, place, and time and well-developed, well-nourished, and in no distress.  He is alone.  Appears pale.  HENT:  Head: Normocephalic and atraumatic.  Mouth/Throat: Oropharynx is clear and moist. No oropharyngeal exudate.  Eyes: Pupils are equal, round, and reactive to light.  Neck: Normal range of motion. Neck supple.  Cardiovascular: Normal rate and regular rhythm.  Pulmonary/Chest: No respiratory distress. He has no wheezes.  Decreased air entry bil.   Abdominal: Soft. Bowel sounds are normal. He exhibits no distension and no mass. There is no abdominal tenderness. There is no rebound and no guarding.  Musculoskeletal: Normal range of motion.        General: No tenderness or edema.  Neurological: He is alert and oriented to person, place, and time.  Skin: Skin is warm. There is pallor.  Multiple bruises noted in bilateral upper extremity.  Psychiatric: Affect normal.      LABORATORY DATA:  I have reviewed the data as listed    Component Value  Date/Time   NA 144 01/06/2019 1013   NA 142 05/03/2014 1139   K 4.5 01/06/2019 1013   K 4.7 05/03/2014 1139   CL 114 (H) 01/06/2019 1013   CL 110 (H) 05/03/2014 1139   CO2 22 01/06/2019 1013   CO2 24 05/03/2014 1139   GLUCOSE 90 01/06/2019 1013   GLUCOSE 98 05/03/2014 1139   BUN 18 01/06/2019 1013   BUN 20 (H) 05/03/2014 1139   CREATININE 1.07 01/06/2019 1013   CREATININE 1.22 08/09/2014 1122   CALCIUM 8.7 (L) 01/06/2019 1013   CALCIUM 8.6 05/03/2014 1139   PROT 6.9 08/20/2018 1009   PROT 7.5 05/03/2014 1139   ALBUMIN 3.7 08/20/2018 1009   ALBUMIN 4.1 05/03/2014 1139   AST 12 (L) 08/20/2018 1009   AST 15 05/03/2014 1139   ALT 9 08/20/2018 1009   ALT 26 05/03/2014 1139   ALKPHOS 83 08/20/2018 1009   ALKPHOS 94 05/03/2014 1139   BILITOT 0.4 08/20/2018 1009   BILITOT 0.5 05/03/2014 1139   GFRNONAA >60 01/06/2019 1013   GFRNONAA >60 08/09/2014 1122   GFRAA >60 01/06/2019 1013   GFRAA >60 08/09/2014 1122    No results found for: SPEP, UPEP  Lab Results  Component Value Date   WBC 11.2 (H) 01/06/2019   NEUTROABS 2.8 01/06/2019   HGB 8.9 (L) 01/06/2019   HCT 28.5 (L) 01/06/2019   MCV 104.8 (H) 01/06/2019   PLT 102 (L) 01/06/2019      Chemistry      Component Value Date/Time   NA 144 01/06/2019 1013   NA 142 05/03/2014 1139   K 4.5 01/06/2019 1013   K 4.7 05/03/2014 1139   CL 114 (H) 01/06/2019 1013   CL 110 (H) 05/03/2014 1139   CO2 22 01/06/2019 1013   CO2 24 05/03/2014 1139   BUN 18 01/06/2019 1013   BUN 20 (H) 05/03/2014 1139   CREATININE 1.07 01/06/2019 1013   CREATININE 1.22 08/09/2014 1122      Component Value Date/Time   CALCIUM 8.7 (L) 01/06/2019 1013   CALCIUM 8.6 05/03/2014 1139   ALKPHOS 83 08/20/2018 1009   ALKPHOS 94 05/03/2014 1139   AST 12 (L) 08/20/2018 1009   AST 15 05/03/2014 1139   ALT 9 08/20/2018 1009   ALT 26 05/03/2014 1139   BILITOT 0.4 08/20/2018 1009   BILITOT 0.5 05/03/2014 1139       RADIOGRAPHIC STUDIES: I have  personally reviewed the radiological images as listed and agreed with the findings in the report. No results found.   ASSESSMENT & PLAN:  CLL (chronic lymphocytic leukemia) ([place]) # CLL/SLL- relapsed most recently on  ibrutinib; July 2020- CT Ab/Pelvis- CT scan shows no progressive/worsening lymphadenopathy.  Currently held ibrutinib because of progressive anemia [see discussion below]  # #Worsening anemia-hemoglobin 7.5 needing to units of PRBC transfusion in the hospital; today hemoglobin is 8.9.  Second to CKD/CLL;  copper deficiency/folic acid deficiency noted.  Recommend proceeding with Aranesp today.  Iron stores normal.  Recommend copper 4 mg a day/folic acid 2 mg a day  # Constipation- recommend miralax prn.   # Abdominal pain/dyspepsia/unclear etiology; stable continue Percocet for now. S/p GI eval at [facility]; awaiting hepatology evaluation; [place]- normal; July 2020- CT no acute process.   # DISPOSITION:  # aranesp today; # in 3 weeks- MD-cbc/bmp--possible aranesp-Dr.B    No orders of the defined types were placed in this encounter.  All questions were answered. The patient knows to call the clinic with any problems, questions or concerns.      Cammie Sickle, MD 01/06/2019 1:18 PM

## 2019-01-13 ENCOUNTER — Other Ambulatory Visit: Payer: Self-pay | Admitting: *Deleted

## 2019-01-13 DIAGNOSIS — C8300 Small cell B-cell lymphoma, unspecified site: Secondary | ICD-10-CM

## 2019-01-13 DIAGNOSIS — C9112 Chronic lymphocytic leukemia of B-cell type in relapse: Secondary | ICD-10-CM

## 2019-01-13 DIAGNOSIS — C9192 Lymphoid leukemia, unspecified, in relapse: Secondary | ICD-10-CM

## 2019-01-13 MED ORDER — OXYCODONE-ACETAMINOPHEN 5-325 MG PO TABS: ORAL_TABLET | ORAL | 0 refills | Status: DC

## 2019-01-13 NOTE — Telephone Encounter (Signed)
Patient called [facility] requesting refill of Percocet.  As mandated by the [place] STOP Act (Strengthen Opioid Misuse Prevention), the [place] Controlled Substance Reporting System ([place]) was reviewed for this patient.  I have personally consulted with my supervising physician, Dr. Rogue Bussing, who agrees that continuation of opiate therapy is medically appropriate at this time and agrees to provide continual monitoring, including urine/blood drug screens, as indicated. Prescription sent electronically using [employer] secure transmission to requested pharmacy.   [place] Reviewed & PDMP updated in Epic as appropriate.   Beckey Rutter, DNP, AGNP-C [place] at [facility] [phone] (work cell) [phone] (office)

## 2019-01-18 ENCOUNTER — Telehealth: Payer: Self-pay

## 2019-01-18 NOTE — Telephone Encounter (Signed)
Nutrition  Patient identified on Malnutrition Screening report for weight loss and poor appetite.    Chart reviewed  Spoke with patient via phone this am to introduce self and service at [facility].  Patient reports that his appetite is better and he thinks his weight is increasing.  Denied nutritional concerns at this time.  Contact information provided to patient and encouraged to reach out to RD if needed in the future.  Patient appreciative of call.   Cidney Kirkwood B. Zenia Resides, [place], [place] Registered Dietitian [phone] (pager)

## 2019-01-21 MED FILL — IMBRUVICA 280 MG TAB: 280 | 28 days supply | Qty: 28 | Fill #2

## 2019-01-26 ENCOUNTER — Other Ambulatory Visit: Payer: Self-pay

## 2019-01-26 ENCOUNTER — Ambulatory Visit (INDEPENDENT_AMBULATORY_CARE_PROVIDER_SITE_OTHER): Payer: Medicare Other | Admitting: Vascular Surgery

## 2019-01-26 ENCOUNTER — Encounter: Payer: Self-pay | Admitting: Internal Medicine

## 2019-01-26 ENCOUNTER — Ambulatory Visit (INDEPENDENT_AMBULATORY_CARE_PROVIDER_SITE_OTHER): Payer: Medicare Other

## 2019-01-26 ENCOUNTER — Encounter (INDEPENDENT_AMBULATORY_CARE_PROVIDER_SITE_OTHER): Payer: Self-pay | Admitting: Vascular Surgery

## 2019-01-26 VITALS — BP 107/70 | HR 66 | Resp 10 | Ht 68.0 in | Wt 154.0 lb

## 2019-01-26 DIAGNOSIS — I10 Essential (primary) hypertension: Secondary | ICD-10-CM | POA: Diagnosis not present

## 2019-01-26 DIAGNOSIS — E1122 Type 2 diabetes mellitus with diabetic chronic kidney disease: Secondary | ICD-10-CM | POA: Diagnosis not present

## 2019-01-26 DIAGNOSIS — I70219 Atherosclerosis of native arteries of extremities with intermittent claudication, unspecified extremity: Secondary | ICD-10-CM

## 2019-01-26 DIAGNOSIS — N183 Chronic kidney disease, stage 3 (moderate): Secondary | ICD-10-CM

## 2019-01-26 DIAGNOSIS — E785 Hyperlipidemia, unspecified: Secondary | ICD-10-CM | POA: Diagnosis not present

## 2019-01-26 NOTE — Progress Notes (Signed)
MRN : [id number]  James [name] is a 68 y.o. (1951-03-15) male who presents with chief complaint of  Chief Complaint  Patient presents with  . Follow-up  .  History of Present Illness: Patient returns today in follow up of his PAD.  He underwent fairly extensive left lower extremity revascularization as well as bilateral aortoiliac revascularization earlier this year.  He is doing well.  He does not have any rest pain, ulceration, or disabling claudication symptoms.  He has fairly mild claudication symptoms more in the right leg than the left.  His noninvasive studies today show a right ABI 0.83 and the left ABI of 0.89 with biphasic waveforms.  His digital pressure is a little reduced on the right and slightly reduced on the left.  Current Outpatient Medications  Medication Sig Dispense Refill  . amLODipine (NORVASC) 10 MG tablet Take 1 tablet by mouth once daily 30 tablet 4  . aspirin EC 81 MG tablet Take 81 mg by mouth daily.    Marland Kitchen atorvastatin (LIPITOR) 10 MG tablet Take 1 tablet (10 mg total) by mouth daily. 30 tablet 11  . clopidogrel (PLAVIX) 75 MG tablet Take 1 tablet by mouth once daily 90 tablet 0  . dicyclomine (BENTYL) 10 MG capsule Take 10 mg by mouth 4 (four) times daily -  before meals and at bedtime.    . folic acid (FOLVITE) 1 MG tablet Take 1 tablet (1 mg total) by mouth daily. 30 tablet 1  . iron polysaccharides (NU-IRON) 150 MG capsule Take 150 mg by mouth daily.     Marland Kitchen lisinopril (ZESTRIL) 10 MG tablet Take 1 tablet (10 mg total) by mouth daily. 30 tablet 0  . metFORMIN (GLUCOPHAGE) 1000 MG tablet Take 1,000 mg by mouth 2 (two) times daily.    . metoprolol tartrate (LOPRESSOR) 25 MG tablet Take 1 tablet by mouth twice daily 180 tablet 0  . omeprazole (PRILOSEC) 40 MG capsule Take 40 mg by mouth daily.     Marland Kitchen oxyCODONE-acetaminophen (PERCOCET/ROXICET) 5-325 MG tablet 1 pill every 8-12 hours 90 tablet 0  . IMBRUVICA 280 MG TABS TAKE 1 TABLET BY MOUTH DAILY. (Patient not  taking: No sig reported) 28 tablet 6  . sodium polystyrene (SPS) 15 GM/60ML suspension TAKE AS DIRECTED (30 GRAMS) FOR 1 DOSE    . sucralfate (CARAFATE) 1 g tablet DISSOLVE 1 TABLET IN WATER AS DIRECTED BY YOUR DOCTOR AND DRINK BY MOUTH 4 TIMES DAILY (BEFORE MEALS AND NIGHTLY)     No current facility-administered medications for this visit.     Past Medical History:  Diagnosis Date  . CLL (chronic lymphocytic leukemia) ([place])   . Diabetes mellitus without complication ([place])   . Hematuria   . Hypertension     Past Surgical History:  Procedure Laterality Date  . CATARACT EXTRACTION W/ INTRAOCULAR LENS IMPLANT Bilateral   . CHOLECYSTECTOMY  1983  . COLONOSCOPY WITH PROPOFOL N/A 10/27/2017   Procedure: COLONOSCOPY WITH PROPOFOL;  Surgeon: Manya Silvas, MD;  Location: [facility] ENDOSCOPY;  Service: Endoscopy;  Laterality: N/A;  . ESOPHAGOGASTRODUODENOSCOPY (EGD) WITH PROPOFOL N/A 11/20/2016   Procedure: ESOPHAGOGASTRODUODENOSCOPY (EGD) WITH PROPOFOL;  Surgeon: Manya Silvas, MD;  Location: [facility] ENDOSCOPY;  Service: Endoscopy;  Laterality: N/A;  . ESOPHAGOGASTRODUODENOSCOPY (EGD) WITH PROPOFOL N/A 10/27/2017   Procedure: ESOPHAGOGASTRODUODENOSCOPY (EGD) WITH PROPOFOL;  Surgeon: Manya Silvas, MD;  Location: [facility] ENDOSCOPY;  Service: Endoscopy;  Laterality: N/A;  . LOWER EXTREMITY ANGIOGRAPHY Right 05/19/2017   Procedure: LOWER EXTREMITY  ANGIOGRAPHY;  Surgeon: Algernon Huxley, MD;  Location: [place] CV LAB;  Service: Cardiovascular;  Laterality: Right;  . LOWER EXTREMITY ANGIOGRAPHY Left 08/10/2018   Procedure: LOWER EXTREMITY ANGIOGRAPHY;  Surgeon: Algernon Huxley, MD;  Location: [place] CV LAB;  Service: Cardiovascular;  Laterality: Left;    Social History Social History   Tobacco Use  . Smoking status: Current Every Day Smoker    Packs/day: 0.50    Years: 47.00    Pack years: 23.50    Types: Cigarettes  . Smokeless tobacco: Never Used  Substance Use Topics  . Alcohol use:  Yes    Alcohol/week: 1.0 standard drinks    Types: 1 Glasses of wine per week    Comment:  almost none in last 6 months  . Drug use: No    Family History Family History  Problem Relation Age of Onset  . Hypertension Sister   no bleeding disorders, clotting disorders, or aneurysms  No Known Allergies  REVIEW OF SYSTEMS(Negative unless checked)  Constitutional: [] ????Weight loss[] ????Fever[] ????Chills Cardiac:[] ????Chest pain[] ????Chest pressure[] ????Palpitations [] ????Shortness of breath when laying flat [] ????Shortness of breath at rest [] ????Shortness of breath with exertion. Vascular: [x] ????Pain in legs with walking[] ????Pain in legsat rest[] ????Pain in legs when laying flat [x] ????Claudication [] ????Pain in feet when walking [] ????Pain in feet at rest [] ????Pain in feet when laying flat [] ????History of DVT [] ????Phlebitis [x] ????Swelling in legs [] ????Varicose veins [x] ????Non-healing ulcers Pulmonary: [] ????Uses home oxygen [] ????Productive cough[] ????Hemoptysis [] ????Wheeze [] ????COPD [] ????Asthma Neurologic: [] ????Dizziness [] ????Blackouts [] ????Seizures [] ????History of stroke [] ????History of TIA[] ????Aphasia [] ????Temporary blindness[] ????Dysphagia [] ????Weaknessor numbness in arms [] ????Weakness or numbnessin legs Musculoskeletal: [x] ????Arthritis [] ????Joint swelling [] ????Joint pain [] ????Low back pain Hematologic:[] ????Easy bruising[] ????Easy bleeding [] ????Hypercoagulable state [x] ????Anemic  Gastrointestinal:[] ????Blood in stool[] ????Vomiting blood[x] ????Gastroesophageal reflux/heartburn[] ????Abdominal pain Genitourinary: [] ????Chronic kidney disease [] ????Difficulturination [] ????Frequenturination [] ????Burning with urination[] ????Hematuria Skin: [] ????Rashes [x] ????Ulcers [x] ????Wounds Psychological: [] ????History of anxiety[] ????History of major  depression.  Physical Examination  BP 107/70 (BP Location: Left Arm, Patient Position: Sitting, Cuff Size: Normal)   Pulse 66   Resp 10   Ht 5\' 8"  (1.727 m)   Wt 154 lb (69.9 kg)   BMI 23.42 kg/m  Gen:  WD/WN, NAD Head: [place]/AT, No temporalis wasting. Ear/Nose/Throat: Hearing grossly intact, nares w/o erythema or drainage Eyes: Conjunctiva clear. Sclera non-icteric Neck: Supple.  Trachea midline Pulmonary:  Good air movement, no use of accessory muscles.  Cardiac: RRR, no JVD Vascular:  Vessel Right Left  Radial Palpable Palpable                          PT Palpable Palpable  DP Palpable Palpable   Gastrointestinal: soft, non-tender/non-distended. No guarding/reflex.  Musculoskeletal: M/S 5/5 throughout.  No deformity or atrophy.  Trace left lower extremity edema. Neurologic: Sensation grossly intact in extremities.  Symmetrical.  Speech is fluent.  Psychiatric: Judgment intact, Mood & affect appropriate for pt's clinical situation. Dermatologic: No rashes or ulcers noted.  No cellulitis or open wounds.       Labs Recent Results (from the past 2160 hour(s))  CBC with Differential/Platelet     Status: Abnormal   Collection Time: 11/04/18  9:15 AM  Result Value Ref Range   WBC 12.0 (H) 4.0 - 10.5 K/uL   RBC 2.43 (L) 4.22 - 5.81 MIL/uL   Hemoglobin 8.1 (L) 13.0 - 17.0 g/dL   HCT 25.4 (L) 39.0 - 52.0 %   MCV 104.5 (H) 80.0 - 100.0 fL   MCH 33.3 26.0 - 34.0 pg   MCHC 31.9 30.0 - 36.0 g/dL   RDW  18.1 (H) 11.5 - 15.5 %   Platelets 87 (L) 150 - 400 K/uL   nRBC 0.0 0.0 - 0.2 %   Neutrophils Relative % 8 %   Neutro Abs 0.9 (L) 1.7 - 7.7 K/uL   Lymphocytes Relative 53 %   Lymphs Abs 6.6 (H) 0.7 - 4.0 K/uL   Monocytes Relative 37 %   Monocytes Absolute 4.5 (H) 0.1 - 1.0 K/uL   Eosinophils Relative 1 %   Eosinophils Absolute 0.1 0.0 - 0.5 K/uL   Basophils Relative 1 %   Basophils Absolute 0.1 0.0 - 0.1 K/uL   WBC Morphology      CONSISTANT WITH KNOWN CLL. RARE  PROLYMPHS NOTED ON SMEAR.   RBC Morphology MORPHOLOGY UNREMARKABLE    Smear Review PLATELETS APPEAR DECREASED     Comment: PLATELETS VARY IN SIZE.   Immature Granulocytes 0 %   Abs Immature Granulocytes 0.03 0.00 - 0.07 K/uL    Comment: Performed at [facility], [address] [place], [place] [address]  Lactate dehydrogenase     Status: None   Collection Time: 11/25/18  8:44 AM  Result Value Ref Range   LDH 127 98 - 192 U/L    Comment: Performed at [facility], [address] [place], [place] [address]  Basic metabolic panel     Status: Abnormal   Collection Time: 11/25/18  8:44 AM  Result Value Ref Range   Sodium 139 135 - 145 mmol/L   Potassium 6.0 (H) 3.5 - 5.1 mmol/L   Chloride 110 98 - 111 mmol/L   CO2 25 22 - 32 mmol/L   Glucose, Bld 94 70 - 99 mg/dL   BUN 20 8 - 23 mg/dL   Creatinine, Ser 1.44 (H) 0.61 - 1.24 mg/dL   Calcium 8.7 (L) 8.9 - 10.3 mg/dL   GFR calc non Af Amer 49 (L) >60 mL/min   GFR calc Af Amer 57 (L) >60 mL/min   Anion gap 4 (L) 5 - 15    Comment: Performed at [facility], [address] [place], [place] [address]  CBC with Differential     Status: Abnormal   Collection Time: 11/25/18  8:44 AM  Result Value Ref Range   WBC 12.9 (H) 4.0 - 10.5 K/uL   RBC 2.59 (L) 4.22 - 5.81 MIL/uL   Hemoglobin 8.6 (L) 13.0 - 17.0 g/dL   HCT 28.2 (L) 39.0 - 52.0 %   MCV 108.9 (H) 80.0 - 100.0 fL   MCH 33.2 26.0 - 34.0 pg   MCHC 30.5 30.0 - 36.0 g/dL   RDW 18.4 (H) 11.5 - 15.5 %   Platelets 107 (L) 150 - 400 K/uL   nRBC 0.0 0.0 - 0.2 %   Neutrophils Relative % 16 %   Neutro Abs 2.1 1.7 - 7.7 K/uL   Lymphocytes Relative 77 %   Lymphs Abs 9.8 (H) 0.7 - 4.0 K/uL   Monocytes Relative 5 %   Monocytes Absolute 0.7 0.1 - 1.0 K/uL   Eosinophils Relative 1 %   Eosinophils Absolute 0.1 0.0 - 0.5 K/uL   Basophils Relative 1 %   Basophils Absolute 0.1 0.0 - 0.1 K/uL   Immature Granulocytes 0 %   Abs Immature Granulocytes 0.01 0.00 - 0.07 K/uL     Comment: Performed at [facility], [address] [place], [place] [address]  CBC with Differential     Status: Abnormal   Collection Time: 12/16/18  9:06 AM  Result  Value Ref Range   WBC 13.8 (H) 4.0 - 10.5 K/uL   RBC 2.60 (L) 4.22 - 5.81 MIL/uL   Hemoglobin 8.8 (L) 13.0 - 17.0 g/dL   HCT 28.2 (L) 39.0 - 52.0 %   MCV 108.5 (H) 80.0 - 100.0 fL   MCH 33.8 26.0 - 34.0 pg   MCHC 31.2 30.0 - 36.0 g/dL   RDW 16.8 (H) 11.5 - 15.5 %   Platelets 77 (L) 150 - 400 K/uL   nRBC 0.0 0.0 - 0.2 %   Neutrophils Relative % 12 %   Neutro Abs 1.7 1.7 - 7.7 K/uL   Lymphocytes Relative 83 %   Lymphs Abs 11.5 (H) 0.7 - 4.0 K/uL   Monocytes Relative 2 %   Monocytes Absolute 0.3 0.1 - 1.0 K/uL   Eosinophils Relative 2 %   Eosinophils Absolute 0.3 0.0 - 0.5 K/uL   Basophils Relative 1 %   Basophils Absolute 0.1 0.0 - 0.1 K/uL   WBC Morphology CONSISTANT WITH KNOWN CLL    RBC Morphology MIXED RBC POPULATION    Smear Review PLATELETS APPEAR DECREASED     Comment: PLATELETS VARY IN SIZE   Abs Immature Granulocytes 0.00 0.00 - 0.07 K/uL   Smudge Cells PRESENT     Comment: Performed at [facility], [address] [place], [place] [address]  Basic metabolic panel     Status: Abnormal   Collection Time: 12/28/18 10:07 AM  Result Value Ref Range   Sodium 141 135 - 145 mmol/L   Potassium 5.9 (H) 3.5 - 5.1 mmol/L   Chloride 115 (H) 98 - 111 mmol/L   CO2 17 (L) 22 - 32 mmol/L   Glucose, Bld 110 (H) 70 - 99 mg/dL   BUN 56 (H) 8 - 23 mg/dL   Creatinine, Ser 2.28 (H) 0.61 - 1.24 mg/dL   Calcium 9.1 8.9 - 10.3 mg/dL   GFR calc non Af Amer 28 (L) >60 mL/min   GFR calc Af Amer 33 (L) >60 mL/min   Anion gap 9 5 - 15    Comment: Performed at [facility], [address] [place], [place] [address]  CBC     Status: Abnormal   Collection Time: 12/28/18 10:07 AM  Result Value Ref Range   WBC 27.9 (H) 4.0 - 10.5 K/uL   RBC 3.00 (L) 4.22 - 5.81 MIL/uL   Hemoglobin 10.1 (L) 13.0 - 17.0  g/dL   HCT 33.0 (L) 39.0 - 52.0 %   MCV 110.0 (H) 80.0 - 100.0 fL   MCH 33.7 26.0 - 34.0 pg   MCHC 30.6 30.0 - 36.0 g/dL   RDW 16.2 (H) 11.5 - 15.5 %   Platelets 165 150 - 400 K/uL   nRBC 0.0 0.0 - 0.2 %    Comment: Performed at [facility], [address] [place], [place] [address]  Hemoglobin A1c     Status: None   Collection Time: 12/28/18 10:07 AM  Result Value Ref Range   Hgb A1c MFr Bld 5.0 4.8 - 5.6 %    Comment: (NOTE) Pre diabetes:          5.7%-6.4% Diabetes:              >6.4% Glycemic control for   <7.0% adults with diabetes    Mean Plasma Glucose 96.8 mg/dL    Comment: Performed at [facility], [place] [address] [place], [place] [address]  SARS Coronavirus 2 [facility] order, Performed in  [facility] hospital lab) Nasopharyngeal Nasopharyngeal Swab     Status: None   Collection Time: 12/28/18 12:29 PM   Specimen: Nasopharyngeal Swab  Result Value Ref Range   SARS Coronavirus 2 NEGATIVE NEGATIVE    Comment: (NOTE) If result is NEGATIVE SARS-CoV-2 target nucleic acids are NOT DETECTED. The SARS-CoV-2 RNA is generally detectable in upper and lower  respiratory specimens during the acute phase of infection. The lowest  concentration of SARS-CoV-2 viral copies this assay can detect is 250  copies / mL. A negative result does not preclude SARS-CoV-2 infection  and should not be used as the sole basis for treatment or other  patient management decisions.  A negative result may occur with  improper specimen collection / handling, submission of specimen other  than nasopharyngeal swab, presence of viral mutation(s) within the  areas targeted by this assay, and inadequate number of viral copies  (<250 copies / mL). A negative result must be combined with clinical  observations, patient history, and epidemiological information. If result is POSITIVE SARS-CoV-2 target nucleic acids are DETECTED. The SARS-CoV-2 RNA is generally detectable in upper and lower   respiratory specimens dur ing the acute phase of infection.  Positive  results are indicative of active infection with SARS-CoV-2.  Clinical  correlation with patient history and other diagnostic information is  necessary to determine patient infection status.  Positive results do  not rule out bacterial infection or co-infection with other viruses. If result is PRESUMPTIVE POSTIVE SARS-CoV-2 nucleic acids MAY BE PRESENT.   A presumptive positive result was obtained on the submitted specimen  and confirmed on repeat testing.  While 2019 novel coronavirus  (SARS-CoV-2) nucleic acids may be present in the submitted sample  additional confirmatory testing may be necessary for epidemiological  and / or clinical management purposes  to differentiate between  SARS-CoV-2 and other Sarbecovirus currently known to infect humans.  If clinically indicated additional testing with an alternate test  methodology [phone]) is advised. The SARS-CoV-2 RNA is generally  detectable in upper and lower respiratory sp ecimens during the acute  phase of infection. The expected result is Negative. Fact Sheet for Patients:  StrictlyIdeas.no Fact Sheet for Healthcare Providers: BankingDealers.co.za This test is not yet approved or cleared by the [place] FDA and has been authorized for detection and/or diagnosis of SARS-CoV-2 by FDA under an Emergency Use Authorization (EUA).  This EUA will remain in effect (meaning this test can be used) for the duration of the COVID-19 declaration under Section 564(b)(1) of the Act, 21 U.S.C. section 360bbb-3(b)(1), unless the authorization is terminated or revoked sooner. Performed at [facility], [address] [place], [place] [address]   Blood culture (routine x 2)     Status: Abnormal   Collection Time: 12/28/18  2:24 PM   Specimen: BLOOD  Result Value Ref Range   Specimen Description      BLOOD RAC  Performed at [facility], [address] [place], [place] [address]    Special Requests      BLOOD Blood Culture adequate volume Performed at [facility], [place], [place] [address]    Culture  Setup Time      GRAM POSITIVE RODS ANAEROBIC BOTTLE ONLY GRAM POSITIVE COCCI AEROBIC BOTTLE ONLY CRITICAL RESULT CALLED TO, READ BACK BY AND VERIFIED WITH: [place] AT [place] 12/29/2018 SDR Organism ID to follow CRITICAL RESULT CALLED TO, READ BACK BY AND VERIFIED WITH: Winfield Rast PATEL 12/29/18 [place]  Performed at [facility], [address] [place], [place] [address]    Culture (A)     BACILLUS SPECIES Standardized susceptibility testing for this organism is not available. STAPHYLOCOCCUS SPECIES (COAGULASE NEGATIVE) THE SIGNIFICANCE OF ISOLATING THIS ORGANISM FROM A SINGLE SET OF BLOOD CULTURES WHEN MULTIPLE SETS ARE DRAWN IS UNCERTAIN. PLEASE NOTIFY THE MICROBIOLOGY DEPARTMENT WITHIN ONE WEEK IF SPECIATION AND SENSITIVITIES ARE REQUIRED. Performed at [facility], [place] [address] [place], [place] [address]    Report Status 12/31/2018 FINAL   Lactic acid, plasma     Status: None   Collection Time: 12/28/18  2:24 PM  Result Value Ref Range   Lactic Acid, Venous 1.1 0.5 - 1.9 mmol/L    Comment: Performed at [facility], [address] [place], [place] [address]  Blood Culture ID Panel (Reflexed)     Status: Abnormal   Collection Time: 12/28/18  2:24 PM  Result Value Ref Range   Enterococcus species NOT DETECTED NOT DETECTED   Listeria monocytogenes NOT DETECTED NOT DETECTED   Staphylococcus species DETECTED (A) NOT DETECTED    Comment: Methicillin (oxacillin) resistant coagulase negative staphylococcus. Possible blood culture contaminant (unless isolated from more than one blood culture draw or clinical case suggests pathogenicity). No antibiotic treatment is indicated for blood  culture contaminants. CRITICAL RESULT CALLED TO,  READ BACK BY AND VERIFIED WITH: Winfield Rast PATEL 12/29/18 1216 KLW    Staphylococcus aureus (BCID) NOT DETECTED NOT DETECTED   Methicillin resistance DETECTED (A) NOT DETECTED    Comment: CRITICAL RESULT CALLED TO, READ BACK BY AND VERIFIED WITH: Winfield Rast PATEL 12/29/18 1216 KLW    Streptococcus species NOT DETECTED NOT DETECTED   Streptococcus agalactiae NOT DETECTED NOT DETECTED   Streptococcus pneumoniae NOT DETECTED NOT DETECTED   Streptococcus pyogenes NOT DETECTED NOT DETECTED   Acinetobacter baumannii NOT DETECTED NOT DETECTED   Enterobacteriaceae species NOT DETECTED NOT DETECTED   Enterobacter cloacae complex NOT DETECTED NOT DETECTED   Escherichia coli NOT DETECTED NOT DETECTED   Klebsiella oxytoca NOT DETECTED NOT DETECTED   Klebsiella pneumoniae NOT DETECTED NOT DETECTED   Proteus species NOT DETECTED NOT DETECTED   Serratia marcescens NOT DETECTED NOT DETECTED   Haemophilus influenzae NOT DETECTED NOT DETECTED   Neisseria meningitidis NOT DETECTED NOT DETECTED   Pseudomonas aeruginosa NOT DETECTED NOT DETECTED   Candida albicans NOT DETECTED NOT DETECTED   Candida glabrata NOT DETECTED NOT DETECTED   Candida krusei NOT DETECTED NOT DETECTED   Candida parapsilosis NOT DETECTED NOT DETECTED   Candida tropicalis NOT DETECTED NOT DETECTED    Comment: Performed at [facility], [address] [place], [place] [address]  Blood culture (routine x 2)     Status: None   Collection Time: 12/28/18  2:25 PM   Specimen: BLOOD  Result Value Ref Range   Specimen Description BLOOD LAC    Special Requests      BOTTLES DRAWN AEROBIC AND ANAEROBIC Blood Culture adequate volume   Culture      NO GROWTH 5 DAYS Performed at [facility], [address] [place],  [address]    Report Status 01/02/2019 FINAL   Glucose, capillary     Status: None   Collection Time: 12/28/18  5:41 PM  Result Value Ref Range   Glucose-Capillary 83 70 - 99 mg/dL  Lactic acid, plasma      Status: None   Collection Time: 12/28/18  8:32 PM  Result Value Ref Range  Lactic Acid, Venous 1.3 0.5 - 1.9 mmol/L    Comment: Performed at [facility], [address] [place], [place] [address]  Glucose, capillary     Status: Abnormal   Collection Time: 12/28/18  9:19 PM  Result Value Ref Range   Glucose-Capillary 117 (H) 70 - 99 mg/dL  HIV antibody (Routine Testing)     Status: None   Collection Time: 12/29/18  4:40 AM  Result Value Ref Range   HIV Screen 4th Generation wRfx Non Reactive Non Reactive    Comment: (NOTE) Performed At: [facility] [place], [place] [id number] Rush Farmer MD [id number]   Basic metabolic panel     Status: Abnormal   Collection Time: 12/29/18  4:40 AM  Result Value Ref Range   Sodium 138 135 - 145 mmol/L   Potassium 4.8 3.5 - 5.1 mmol/L   Chloride 115 (H) 98 - 111 mmol/L   CO2 17 (L) 22 - 32 mmol/L   Glucose, Bld 77 70 - 99 mg/dL   BUN 47 (H) 8 - 23 mg/dL   Creatinine, Ser 1.50 (H) 0.61 - 1.24 mg/dL   Calcium 8.2 (L) 8.9 - 10.3 mg/dL   GFR calc non Af Amer 47 (L) >60 mL/min   GFR calc Af Amer 54 (L) >60 mL/min   Anion gap 6 5 - 15    Comment: Performed at [facility], [address] [place], [place] [address]  CBC     Status: Abnormal   Collection Time: 12/29/18  4:40 AM  Result Value Ref Range   WBC 13.3 (H) 4.0 - 10.5 K/uL    Comment: Performed at [facility], [address] [place], [place] [address]   RBC 2.21 (L) 4.22 - 5.81 MIL/uL    Comment: Performed at [facility], [address] [place], [place] [address]   Hemoglobin 7.5 (L) 13.0 - 17.0 g/dL    Comment: DELTA CHECK NOTED Performed at [facility], [place] [address], [place], [place] [address]    HCT 24.1 (L) 39.0 - 52.0 %   MCV 109.0 (H) 80.0 - 100.0 fL   MCH 33.9 26.0 - 34.0 pg   MCHC 31.1 30.0 - 36.0 g/dL   RDW 16.1 (H) 11.5 - 15.5 %   Platelets 105 (L) 150 - 400 K/uL    Comment: Immature  Platelet Fraction may be clinically indicated, consider ordering this additional test [id number]    nRBC 0.0 0.0 - 0.2 %    Comment: Performed at [facility], [address] [place], [place] [address]  Glucose, capillary     Status: None   Collection Time: 12/29/18  7:08 AM  Result Value Ref Range   Glucose-Capillary 79 70 - 99 mg/dL   Comment 1 Notify RN    Comment 2 Document in Chart   Prepare RBC     Status: None   Collection Time: 12/29/18  8:28 AM  Result Value Ref Range   Order Confirmation      ORDER PROCESSED BY BLOOD BANK Performed at [facility], [address] [place], [place] [address]   ECHOCARDIOGRAM COMPLETE     Status: None   Collection Time: 12/29/18  9:20 AM  Result Value Ref Range   Weight 2,320 oz   Height 68 in   BP 102/53 mmHg  Type and screen [place]     Status: None   Collection Time: 12/29/18  9:26 AM  Result Value Ref Range   ABO/RH(D) A POS    Antibody Screen NEG    Sample Expiration 01/01/2019,2359    Unit Number [id number]    Blood Component Type RBC, LR IRR    Unit division 00    Status of Unit ISSUED,FINAL    Unit tag comment IRRADIATED PRODUCT    Transfusion Status OK TO TRANSFUSE    Crossmatch Result Compatible    Unit Number [id number]    Blood Component Type RBC, LR IRR    Unit division 00    Status of Unit ISSUED,FINAL    Transfusion Status OK TO TRANSFUSE    Crossmatch Result      Compatible Performed at [facility], [address] [place], [place] [address]   BPAM RBC     Status: None   Collection Time: 12/29/18  9:26 AM  Result Value Ref Range   ISSUE DATE / TIME [id number]    Blood Product Unit Number [id number]    PRODUCT CODE E7517G01    Unit Type and Rh 7494    Blood Product Expiration Date 496759163846    ISSUE DATE / TIME 659935701779    Blood Product Unit Number [id number]    PRODUCT CODE M2263F35    Unit Type and Rh 0600    Blood Product  Expiration Date [id number]   Glucose, capillary     Status: Abnormal   Collection Time: 12/29/18 12:05 PM  Result Value Ref Range   Glucose-Capillary 105 (H) 70 - 99 mg/dL   Comment 1 Notify RN    Comment 2 Document in Chart   Hemoglobin and hematocrit, blood     Status: Abnormal   Collection Time: 12/29/18  4:48 PM  Result Value Ref Range   Hemoglobin 8.6 (L) 13.0 - 17.0 g/dL   HCT 26.8 (L) 39.0 - 52.0 %    Comment: Performed at [facility], [address] [place], [place] [address]  Technologist smear review     Status: None   Collection Time: 12/29/18  4:48 PM  Result Value Ref Range   WBC Morphology MORPHOLOGY UNREMARKABLE    RBC Morphology POLYCHROMASIA PRESENT     Comment: MIXED RBC POPULATION   Tech Review MORPHOLOGY UNREMARKABLE     Comment: Performed at [facility], [address] [place], [place] [address]  Glucose, capillary     Status: Abnormal   Collection Time: 12/29/18  5:27 PM  Result Value Ref Range   Glucose-Capillary 103 (H) 70 - 99 mg/dL   Comment 1 Notify RN    Comment 2 Document in Chart   Glucose, capillary     Status: Abnormal   Collection Time: 12/29/18  9:36 PM  Result Value Ref Range   Glucose-Capillary 123 (H) 70 - 99 mg/dL  Vitamin B12     Status: None   Collection Time: 12/30/18  4:48 AM  Result Value Ref Range   Vitamin B-12 453 180 - 914 pg/mL    Comment: (NOTE) This assay is not validated for testing neonatal or myeloproliferative syndrome specimens for Vitamin B12 levels. Performed at [facility], [place] [address] [place], [place] [address]   Lactate dehydrogenase     Status: Abnormal   Collection Time: 12/30/18  4:48 AM  Result Value Ref Range   LDH 96 (L) 98 - 192 U/L    Comment: Performed at [facility], [place], [place] [address]  Iron and TIBC     Status: Abnormal  Collection Time: 12/30/18  4:48 AM  Result Value Ref Range   Iron 224 (H) 45 - 182 ug/dL   TIBC 261 250 -  450 ug/dL   Saturation Ratios 86 (H) 17.9 - 39.5 %   UIBC 37 ug/dL    Comment: Performed at [facility], [address] [place], [place] [address]  Ferritin     Status: None   Collection Time: 12/30/18  4:48 AM  Result Value Ref Range   Ferritin 123 24 - 336 ng/mL    Comment: Performed at [facility], [address], [place], [place] [address]  Folate     Status: Abnormal   Collection Time: 12/30/18  4:48 AM  Result Value Ref Range   Folate 3.1 (L) >5.9 ng/mL    Comment: Performed at [facility], [address] [place], [place] [address]  Copper, serum     Status: Abnormal   Collection Time: 12/30/18  4:48 AM  Result Value Ref Range   Copper 51 (L) 72 - 166 ug/dL    Comment: (NOTE) This test was developed and its performance characteristics determined by [facility]. It has not been cleared or approved by the Food and Drug Administration.                                Detection Limit = 5 Performed At: [facility] [place], [place] [id number] Rush Farmer MD [id number]   Zinc     Status: None   Collection Time: 12/30/18  4:48 AM  Result Value Ref Range   Zinc 72 56 - 134 ug/dL    Comment: (NOTE) This test was developed and its performance characteristics determined by [facility]. It has not been cleared or approved by the Food and Drug Administration.                                Detection Limit = 5 Performed At: [facility] [place], [place] [id number] Rush Farmer MD [id number]   Haptoglobin     Status: None   Collection Time: 12/30/18  4:48 AM  Result Value Ref Range   Haptoglobin 47 32 - 363 mg/dL    Comment: (NOTE) Performed At: [facility] [place], [place] [id number] Rush Farmer MD [id number]   Reticulocytes     Status: Abnormal   Collection Time: 12/30/18  4:48 AM  Result Value Ref Range   Retic Ct Pct 0.8 0.4 - 3.1 %   RBC. 2.36 (L) 4.22 -  5.81 MIL/uL   Retic Count, Absolute 18.9 (L) 19.0 - 186.0 K/uL   Immature Retic Fract 11.3 2.3 - 15.9 %    Comment: Performed at [facility], [address] [place], [place] [address]  CBC with Differential     Status: Abnormal   Collection Time: 12/30/18  4:48 AM  Result Value Ref Range   WBC 11.0 (H) 4.0 - 10.5 K/uL   RBC 2.36 (L) 4.22 - 5.81 MIL/uL   Hemoglobin 7.9 (L) 13.0 - 17.0 g/dL   HCT 24.1 (L) 39.0 - 52.0 %   MCV 102.1 (H) 80.0 - 100.0 fL   MCH 33.5 26.0 - 34.0 pg   MCHC 32.8 30.0 - 36.0 g/dL   RDW 19.9 (H) 11.5 - 15.5 %   Platelets 84 (L) 150 - 400 K/uL  Comment: Immature Platelet Fraction may be clinically indicated, consider ordering this additional test [id number]    nRBC 0.0 0.0 - 0.2 %   Neutrophils Relative % 25 %   Neutro Abs 2.7 1.7 - 7.7 K/uL   Lymphocytes Relative 67 %   Lymphs Abs 7.4 (H) 0.7 - 4.0 K/uL   Monocytes Relative 7 %   Monocytes Absolute 0.7 0.1 - 1.0 K/uL   Eosinophils Relative 0 %   Eosinophils Absolute 0.0 0.0 - 0.5 K/uL   Basophils Relative 1 %   Basophils Absolute 0.1 0.0 - 0.1 K/uL   WBC Morphology MORPHOLOGY UNREMARKABLE    RBC Morphology MORPHOLOGY UNREMARKABLE    Smear Review Normal platelet morphology    Immature Granulocytes 0 %   Abs Immature Granulocytes 0.03 0.00 - 0.07 K/uL    Comment: Performed at [facility], [address] [place], [place] [address]  Glucose, capillary     Status: None   Collection Time: 12/30/18  8:09 AM  Result Value Ref Range   Glucose-Capillary 89 70 - 99 mg/dL   Comment 1 Notify RN   Prepare RBC     Status: None   Collection Time: 12/30/18  8:31 AM  Result Value Ref Range   Order Confirmation      ORDER PROCESSED BY BLOOD BANK Performed at [facility], [address] [place], [place] [address]   Glucose, capillary     Status: Abnormal   Collection Time: 12/30/18 12:05 PM  Result Value Ref Range   Glucose-Capillary 124 (H) 70 - 99 mg/dL  Basic metabolic  panel     Status: Abnormal   Collection Time: 01/06/19 10:13 AM  Result Value Ref Range   Sodium 144 135 - 145 mmol/L   Potassium 4.5 3.5 - 5.1 mmol/L   Chloride 114 (H) 98 - 111 mmol/L   CO2 22 22 - 32 mmol/L   Glucose, Bld 90 70 - 99 mg/dL   BUN 18 8 - 23 mg/dL   Creatinine, Ser 1.07 0.61 - 1.24 mg/dL   Calcium 8.7 (L) 8.9 - 10.3 mg/dL   GFR calc non Af Amer >60 >60 mL/min   GFR calc Af Amer >60 >60 mL/min   Anion gap 8 5 - 15    Comment: Performed at [facility], [address] [place], [place] [address]  CBC with Differential     Status: Abnormal   Collection Time: 01/06/19 10:13 AM  Result Value Ref Range   WBC 11.2 (H) 4.0 - 10.5 K/uL   RBC 2.72 (L) 4.22 - 5.81 MIL/uL   Hemoglobin 8.9 (L) 13.0 - 17.0 g/dL   HCT 28.5 (L) 39.0 - 52.0 %   MCV 104.8 (H) 80.0 - 100.0 fL   MCH 32.7 26.0 - 34.0 pg   MCHC 31.2 30.0 - 36.0 g/dL   RDW 17.5 (H) 11.5 - 15.5 %   Platelets 102 (L) 150 - 400 K/uL   nRBC 0.0 0.0 - 0.2 %   Neutrophils Relative % 25 %   Neutro Abs 2.8 1.7 - 7.7 K/uL   Lymphocytes Relative 65 %   Lymphs Abs 7.3 (H) 0.7 - 4.0 K/uL   Monocytes Relative 7 %   Monocytes Absolute 0.8 0.1 - 1.0 K/uL   Eosinophils Relative 1 %   Eosinophils Absolute 0.1 0.0 - 0.5 K/uL   Basophils Relative 2 %   Basophils Absolute 0.2 (H) 0.0 - 0.1 K/uL   WBC Morphology DIFF CONFIRMED BY MANUAL  RBC Morphology MIXED RBC POPULATION    Smear Review Normal platelet morphology     Comment: PLATELETS APPEAR DECREASED   Immature Granulocytes 0 %   Abs Immature Granulocytes 0.02 0.00 - 0.07 K/uL    Comment: Performed at [facility], [address] [place], [place] [address]  Lactate dehydrogenase     Status: None   Collection Time: 01/06/19 10:13 AM  Result Value Ref Range   LDH 141 98 - 192 U/L    Comment: Performed at [facility], [address] [place], [place] [address]    Radiology Dg Chest 2 View  Result Date: 12/28/2018 CLINICAL DATA:  Shortness of  breath EXAM: CHEST - 2 VIEW COMPARISON:  CT chest, 01/20/2018 FINDINGS: The heart size and mediastinal contours are within normal limits. Both lungs are clear. Disc degenerative disease of the lower thoracic spine. IMPRESSION: No acute abnormality of the lungs. Electronically Signed   By: Eddie Candle M.D.   On: 12/28/2018 11:29    Assessment/Plan Essential hypertension blood pressure control important in reducing the progression of atherosclerotic disease. On appropriate oral medications.   Type 2 diabetes mellitus with complication (HCC) blood glucose control important in reducing the progression of atherosclerotic disease. Also, involved in wound healing. On appropriate medications.  Hyperlipidemia lipid control important in reducing the progression of atherosclerotic disease. Continue statin therapy  Atherosclerotic peripheral vascular disease with intermittent claudication (HCC) His noninvasive studies today show a right ABI 0.83 and the left ABI of 0.89 with biphasic waveforms.  His digital pressure is a little reduced on the right and slightly reduced on the left. Overall doing fairly well.  No major limb threatening issues.  Slight drop in his ABI so we will keep him on 80-month follow-up intervals.    Leotis Pain, MD  01/26/2019 2:59 PM    This note was created with Dragon medical transcription system.  Any errors from dictation are purely unintentional

## 2019-01-26 NOTE — Assessment & Plan Note (Signed)
His noninvasive studies today show a right ABI 0.83 and the left ABI of 0.89 with biphasic waveforms.  His digital pressure is a little reduced on the right and slightly reduced on the left. Overall doing fairly well.  No major limb threatening issues.  Slight drop in his ABI so we will keep him on 33-month follow-up intervals.

## 2019-01-27 ENCOUNTER — Other Ambulatory Visit: Payer: Self-pay | Admitting: Internal Medicine

## 2019-01-27 ENCOUNTER — Inpatient Hospital Stay (HOSPITAL_BASED_OUTPATIENT_CLINIC_OR_DEPARTMENT_OTHER): Payer: Medicare Other | Admitting: Internal Medicine

## 2019-01-27 ENCOUNTER — Inpatient Hospital Stay: Payer: Medicare Other

## 2019-01-27 ENCOUNTER — Other Ambulatory Visit: Payer: Self-pay

## 2019-01-27 VITALS — BP 122/70 | HR 48 | Temp 97.0°F | Wt 155.8 lb

## 2019-01-27 DIAGNOSIS — C9110 Chronic lymphocytic leukemia of B-cell type not having achieved remission: Secondary | ICD-10-CM

## 2019-01-27 DIAGNOSIS — C9112 Chronic lymphocytic leukemia of B-cell type in relapse: Secondary | ICD-10-CM

## 2019-01-27 DIAGNOSIS — D631 Anemia in chronic kidney disease: Secondary | ICD-10-CM

## 2019-01-27 DIAGNOSIS — N183 Chronic kidney disease, stage 3 unspecified: Secondary | ICD-10-CM

## 2019-01-27 DIAGNOSIS — I70219 Atherosclerosis of native arteries of extremities with intermittent claudication, unspecified extremity: Secondary | ICD-10-CM | POA: Diagnosis not present

## 2019-01-27 DIAGNOSIS — C9192 Lymphoid leukemia, unspecified, in relapse: Secondary | ICD-10-CM

## 2019-01-27 DIAGNOSIS — N189 Chronic kidney disease, unspecified: Secondary | ICD-10-CM | POA: Diagnosis not present

## 2019-01-27 LAB — BASIC METABOLIC PANEL
Anion gap: 6 (ref 5–15)
BUN: 31 mg/dL — ABNORMAL HIGH (ref 8–23)
CO2: 23 mmol/L (ref 22–32)
Calcium: 8.7 mg/dL — ABNORMAL LOW (ref 8.9–10.3)
Chloride: 113 mmol/L — ABNORMAL HIGH (ref 98–111)
Creatinine, Ser: 1.29 mg/dL — ABNORMAL HIGH (ref 0.61–1.24)
GFR calc Af Amer: >60 mL/min (ref >60)
GFR calc non Af Amer: 56 mL/min — ABNORMAL LOW (ref >60)
Glucose, Bld: 115 mg/dL — ABNORMAL HIGH (ref 70–99)
Potassium: 5.9 mmol/L — ABNORMAL HIGH (ref 3.5–5.1)
Sodium: 142 mmol/L (ref 135–145)

## 2019-01-27 LAB — CBC WITH DIFFERENTIAL/PLATELET
Abs Immature Granulocytes: 0.05 10*3/uL (ref 0.00–0.07)
Basophils Absolute: 0.1 10*3/uL (ref 0.0–0.1)
Basophils Relative: 1 %
Eosinophils Absolute: 0.3 10*3/uL (ref 0.0–0.5)
Eosinophils Relative: 2 %
HCT: 27 % — ABNORMAL LOW (ref 39.0–52.0)
Hemoglobin: 8.3 g/dL — ABNORMAL LOW (ref 13.0–17.0)
Immature Granulocytes: 0 %
Lymphocytes Relative: 45 %
Lymphs Abs: 7.9 10*3/uL — ABNORMAL HIGH (ref 0.7–4.0)
MCH: 32.7 pg (ref 26.0–34.0)
MCHC: 30.7 g/dL (ref 30.0–36.0)
MCV: 106.3 fL — ABNORMAL HIGH (ref 80.0–100.0)
Monocytes Absolute: 5 10*3/uL — ABNORMAL HIGH (ref 0.1–1.0)
Monocytes Relative: 29 %
Neutro Abs: 4.1 10*3/uL (ref 1.7–7.7)
Neutrophils Relative %: 23 %
Platelets: 149 10*3/uL — ABNORMAL LOW (ref 150–400)
RBC: 2.54 MIL/uL — ABNORMAL LOW (ref 4.22–5.81)
RDW: 18 % — ABNORMAL HIGH (ref 11.5–15.5)
Smear Review: NORMAL
WBC: 17.5 10*3/uL — ABNORMAL HIGH (ref 4.0–10.5)
nRBC: 0 % (ref 0.0–0.2)

## 2019-01-27 LAB — SAMPLE TO BLOOD BANK

## 2019-01-27 LAB — LACTATE DEHYDROGENASE: LDH: 146 U/L (ref 98–192)

## 2019-01-27 MED ORDER — DARBEPOETIN ALFA 300 MCG/0.6ML IJ SOSY
300.0000 ug | PREFILLED_SYRINGE | Freq: Once | INTRAMUSCULAR | Status: AC
  Administered 2019-01-27: 11:00:00 300 ug via SUBCUTANEOUS
  Filled 2019-01-27: qty 0.6

## 2019-01-27 MED ORDER — SODIUM POLYSTYRENE SULFONATE 15 GM/60ML PO SUSP: 30.0000 g | Freq: Once | ORAL | 0 refills | Status: AC

## 2019-01-27 NOTE — Progress Notes (Signed)
Per Jerene Pitch CMA per Dr. Rogue Bussing no blood transfusion needed at this time, pt to receive Aranesp only.

## 2019-01-27 NOTE — Progress Notes (Signed)
[facility] there [place] OFFICE PROGRESS NOTE  Patient Care Team: [name], MD as PCP - General (Internal Medicine)  Cancer Staging No matching staging information was found for the patient.   Oncology History Overview Note  # 2006- CLL STAGE IV; MAY 2011- WBC- 57K;Platelets-99;Hb-12/CT Bulky LN; BMBx- 80% Invol; del 11; START Benda-Ritux x4 [finished Sep 2011];   # July 2015-Progression; Sep 2015-START ibrutinib; CT scan DEC 2015- Improvement LN; Cont Ibrutinib 136m/d; NOV 2016 CT- 1-2CM LN [mild progression compared to Dec 2015];NOV 2016- FISH peripheral blood- NO MUTATIONS/CD-38 Positive; NOV 7th- CONT IBRUTINIB 2 pills/day; CT AUG 2017- STABLE;  DEC 6th PET- Mild RP LN/ Retrocrural LN  # OFF ibrutinib [? intol]- sep 2019- Jan 16th 2020; Re-start Ibrutinib; September 2020-stop ibrutinib [poor tolerance/worsening anemia]  # Jan 16th 2020- start aranesp  # DEC 2019- PAIN CONTRACT  #October 2019-bone marrow biopsy [worsening anemia]-question dyserythropoietic changes/small clone of CLL;  # [facility] HEM- NEG.  SA skin infection [Oct 2016] s/p clinda -------------------------------------------------------    DIAGNOSIS: CLL  STAGE:   IV      ;GOALS: palliative  CURRENT/MOST RECENT THERAPY : Ibrutinib.    CLL (chronic lymphocytic leukemia) (HCC)    INTERVAL HISTORY:  James [name] 68y.o.  male pleasant patient above history of CLL [currently off ibrutinib] and chronic abdominal pain of unclear etiology; worsening anemia of unclear etiology/? CKD is here for follow-up.  Patient has not had any repeat hospitalizations.  He continues to feel poorly.  Continues to have chronic abdominal pain states getting worse.  Intermittent constipation.  No nausea no vomiting.  He states been folic acid/copper supplements.  Review of Systems  Constitutional: Positive for malaise/fatigue and weight loss. Negative for chills, diaphoresis and fever.  HENT:  Negative for nosebleeds and sore throat.   Eyes: Negative for double vision.  Respiratory: Negative for cough, hemoptysis, sputum production, shortness of breath and wheezing.   Cardiovascular: Negative for chest pain, palpitations, orthopnea and leg swelling.  Gastrointestinal: Positive for abdominal pain, constipation and nausea. Negative for blood in stool, diarrhea, heartburn, melena and vomiting.  Genitourinary: Negative for dysuria, frequency and urgency.  Skin: Negative.  Negative for itching and rash.  Neurological: Negative for dizziness, tingling, focal weakness, weakness and headaches.  Endo/Heme/Allergies: Does not bruise/bleed easily.  Psychiatric/Behavioral: Negative for depression. The patient is not nervous/anxious and does not have insomnia.       PAST MEDICAL HISTORY :  Past Medical History:  Diagnosis Date  . [place]   . [place] mellitus without complication ([place]   . Hematuria   . Hypertension     PAST SURGICAL HISTORY :   Past Surgical History:  Procedure Laterality Date  . CATARACT EXTRACTION W/ INTRAOCULAR LENS IMPLANT Bilateral   . CHOLECYSTECTOMY  1983  . COLONOSCOPY WITH PROPOFOL N/A 10/27/2017   Procedure: COLONOSCOPY WITH PROPOFOL;  Surgeon: EManya Silvas MD;  [facility];  Service: Endoscopy;  Laterality: N/A;  . ESOPHAGOGASTRODUODENOSCOPY (EGD) WITH PROPOFOL N/A 11/20/2016   Procedure: ESOPHAGOGASTRODUODENOSCOPY (EGD) WITH PROPOFOL;  Surgeon: EManya Silvas MD;  [facility];  Service: Endoscopy;  Laterality: N/A;  . ESOPHAGOGASTRODUODENOSCOPY (EGD) WITH PROPOFOL N/A 10/27/2017   Procedure: ESOPHAGOGASTRODUODENOSCOPY (EGD) WITH PROPOFOL;  Surgeon: EManya Silvas MD;  [facility];  Service: Endoscopy;  Laterality: N/A;  . LOWER EXTREMITY ANGIOGRAPHY Right 05/19/2017   Procedure: LOWER EXTREMITY ANGIOGRAPHY;  Surgeon: DAlgernon Huxley MD;  [facility];  Service:  Cardiovascular;  Laterality: Right;  . LOWER EXTREMITY ANGIOGRAPHY Left 08/10/2018   Procedure: LOWER EXTREMITY ANGIOGRAPHY;  Surgeon: Algernon Huxley, MD;  Location: [place] [facility] LAB;  Service: Cardiovascular;  Laterality: Left;    FAMILY HISTORY :   Family History  Problem Relation Age of Onset  . Hypertension Sister     SOCIAL HISTORY:   Social History   Tobacco Use  . Smoking status: Current Every Day Smoker    Packs/day: 0.50    Years: 47.00    Pack years: 23.50    Types: Cigarettes  . Smokeless tobacco: Never Used  Substance Use Topics  . Alcohol use: Yes    Alcohol/week: 1.0 standard drinks    Types: 1 Glasses of wine per week    Comment:  almost none in last 6 months  . Drug use: No    ALLERGIES:  has No Known Allergies.  MEDICATIONS:  Current Outpatient Medications  Medication Sig Dispense Refill  . amLODipine (NORVASC) 10 MG tablet Take 1 tablet by mouth once daily 30 tablet 4  . aspirin EC 81 MG tablet Take 81 mg by mouth daily.    Marland Kitchen atorvastatin (LIPITOR) 10 MG tablet Take 1 tablet (10 mg total) by mouth daily. 30 tablet 11  . clopidogrel (PLAVIX) 75 MG tablet Take 1 tablet by mouth once daily 90 tablet 0  . dicyclomine (BENTYL) 10 MG capsule Take 10 mg by mouth 4 (four) times daily -  before meals and at bedtime.    . folic acid (FOLVITE) 1 MG tablet Take 1 tablet (1 mg total) by mouth daily. 30 tablet 1  . lisinopril (ZESTRIL) 10 MG tablet Take 1 tablet (10 mg total) by mouth daily. 30 tablet 0  . metFORMIN (GLUCOPHAGE) 1000 MG tablet Take 1,000 mg by mouth 2 (two) times daily.    Marland Kitchen omeprazole (PRILOSEC) 40 MG capsule Take 40 mg by mouth daily.     Marland Kitchen oxyCODONE-acetaminophen (PERCOCET/ROXICET) 5-325 MG tablet 1 pill every 8-12 hours 90 tablet 0  . sodium polystyrene (SPS) 15 GM/60ML suspension Take 120 mLs (30 g total) by mouth once for 1 dose. 120 mL 0  . sucralfate (CARAFATE) 1 g tablet DISSOLVE 1 TABLET IN WATER AS DIRECTED BY YOUR DOCTOR AND DRINK BY  MOUTH 4 TIMES DAILY (BEFORE MEALS AND NIGHTLY)    . IMBRUVICA 280 MG TABS TAKE 1 TABLET BY MOUTH DAILY. (Patient not taking: No sig reported) 28 tablet 6  . iron polysaccharides (NU-IRON) 150 MG capsule Take 150 mg by mouth daily.     . metoprolol tartrate (LOPRESSOR) 25 MG tablet Take 1 tablet by mouth twice daily 180 tablet 0   No current facility-administered medications for this visit.     PHYSICAL EXAMINATION: ECOG PERFORMANCE STATUS: 1 - Symptomatic but completely ambulatory  BP 122/70 (BP Location: Left Arm, Patient Position: Sitting, Cuff Size: Normal)   Pulse (!) 48   Temp (!) 97 F (36.1 C) (Tympanic)   Wt 155 lb 12.8 oz (70.7 kg)   BMI 23.69 kg/m   Filed Weights   01/27/19 1027  Weight: 155 lb 12.8 oz (70.7 kg)    Physical Exam  Constitutional: He is oriented to person, place, and time and well-developed, well-nourished, and in no distress.  He is alone.  Appears pale.  HENT:  Head: Normocephalic and atraumatic.  Mouth/Throat: Oropharynx is clear and moist. No oropharyngeal exudate.  Eyes: Pupils are equal, round, and reactive to light.  Neck: Normal range  of motion. Neck supple.  Cardiovascular: Normal rate and regular rhythm.  Pulmonary/Chest: No respiratory distress. He has no wheezes.  Decreased air entry bil.   Abdominal: Soft. Bowel sounds are normal. He exhibits no distension and no mass. There is no abdominal tenderness. There is no rebound and no guarding.  Musculoskeletal: Normal range of motion.        General: No tenderness or edema.  Neurological: He is alert and oriented to person, place, and time.  Skin: Skin is warm. There is pallor.  Multiple bruises noted in bilateral upper extremity.  Psychiatric: Affect normal.      LABORATORY DATA:  I have reviewed the data as listed    Component Value Date/Time   NA 142 01/27/2019 1010   NA 142 05/03/2014 1139   K 5.9 (H) 01/27/2019 1010   K 4.7 05/03/2014 1139   CL 113 (H) 01/27/2019 1010   CL  110 (H) 05/03/2014 1139   CO2 23 01/27/2019 1010   CO2 24 05/03/2014 1139   GLUCOSE 115 (H) 01/27/2019 1010   GLUCOSE 98 05/03/2014 1139   BUN 31 (H) 01/27/2019 1010   BUN 20 (H) 05/03/2014 1139   CREATININE 1.29 (H) 01/27/2019 1010   CREATININE 1.22 08/09/2014 1122   CALCIUM 8.7 (L) 01/27/2019 1010   CALCIUM 8.6 05/03/2014 1139   PROT 6.9 08/20/2018 1009   PROT 7.5 05/03/2014 1139   ALBUMIN 3.7 08/20/2018 1009   ALBUMIN 4.1 05/03/2014 1139   AST 12 (L) 08/20/2018 1009   AST 15 05/03/2014 1139   ALT 9 08/20/2018 1009   ALT 26 05/03/2014 1139   ALKPHOS 83 08/20/2018 1009   ALKPHOS 94 05/03/2014 1139   BILITOT 0.4 08/20/2018 1009   BILITOT 0.5 05/03/2014 1139   GFRNONAA 56 (L) 01/27/2019 1010   GFRNONAA >60 08/09/2014 1122   GFRAA >60 01/27/2019 1010   GFRAA >60 08/09/2014 1122    No results found for: SPEP, UPEP  Lab Results  Component Value Date   WBC 17.5 (H) 01/27/2019   NEUTROABS 4.1 01/27/2019   HGB 8.3 (L) 01/27/2019   HCT 27.0 (L) 01/27/2019   MCV 106.3 (H) 01/27/2019   PLT 149 (L) 01/27/2019      Chemistry      Component Value Date/Time   NA 142 01/27/2019 1010   NA 142 05/03/2014 1139   K 5.9 (H) 01/27/2019 1010   K 4.7 05/03/2014 1139   CL 113 (H) 01/27/2019 1010   CL 110 (H) 05/03/2014 1139   CO2 23 01/27/2019 1010   CO2 24 05/03/2014 1139   BUN 31 (H) 01/27/2019 1010   BUN 20 (H) 05/03/2014 1139   CREATININE 1.29 (H) 01/27/2019 1010   CREATININE 1.22 08/09/2014 1122      Component Value Date/Time   CALCIUM 8.7 (L) 01/27/2019 1010   CALCIUM 8.6 05/03/2014 1139   ALKPHOS 83 08/20/2018 1009   ALKPHOS 94 05/03/2014 1139   AST 12 (L) 08/20/2018 1009   AST 15 05/03/2014 1139   ALT 9 08/20/2018 1009   ALT 26 05/03/2014 1139   BILITOT 0.4 08/20/2018 1009   BILITOT 0.5 05/03/2014 1139       RADIOGRAPHIC STUDIES: I have personally reviewed the radiological images as listed and agreed with the findings in the report. No results found.    ASSESSMENT & PLAN:  CLL (chronic lymphocytic leukemia) ([place]) # CLL/SLL- relapsed most recently on ibrutinib; July 2020- CT Ab/Pelvis- CT scan shows no progressive/worsening lymphadenopathy.  Currently held ibrutinib  because of progressive anemia [see discussion below].  White count/lymphocytosis slightly rising-concerning for recurrence.  Monitor for now  #Anemia-hemoglobin 8.3 question etiology.  Question CKD versus others-copper/folic acid deficiency.  Currently on supplementation.  Continue Aranesp today.  #Hyperkalemia potassium 5.9-recent admission to hospital for the same.  Creatinine stable at 1.3.  Recommend discontinuation of lisinopril/also give 1 dose of Kayexalate.  We will repeat BMP in 2 days.  # Abdominal pain/dyspepsia/unclear etiology; stable continue Percocet for now.  S/p extensive evaluation at [facility].  Would not recommend increasing the dose of narcotic medication.  # DISPOSITION: add LDH # aranesp today; # BMP in oct 2nd.  # in 2 weeks- MD-cbc/bmp--possible aranesp-Dr.B    Orders Placed This Encounter  Procedures  . CBC with Differential    Standing Status:   Future    Number of Occurrences:   1    Standing Expiration Date:   01/26/2020  . Basic metabolic panel    Standing Status:   Future    Number of Occurrences:   1    Standing Expiration Date:   01/26/2020  . Lactate dehydrogenase    Standing Status:   Future    Standing Expiration Date:   01/27/2020  . Basic metabolic panel    Standing Status:   Future    Standing Expiration Date:   01/27/2020  . CBC with Differential/Platelet    Standing Status:   Future    Standing Expiration Date:   01/27/2020  . Comprehensive metabolic panel    Standing Status:   Future    Standing Expiration Date:   01/27/2020  . Lactate dehydrogenase    Standing Status:   Future    Number of Occurrences:   1    Standing Expiration Date:   01/27/2020  . Hold Tube- Blood Bank    Standing Status:   Future    Number of Occurrences:    1    Standing Expiration Date:   01/26/2020   All questions were answered. The patient knows to call the clinic with any problems, questions or concerns.      Cammie Sickle, MD 01/27/2019 4:39 PM

## 2019-01-27 NOTE — Patient Instructions (Signed)
#  Stop taking lisinopril-as the potassium is running high

## 2019-01-27 NOTE — Assessment & Plan Note (Addendum)
#   CLL/SLL- relapsed most recently on ibrutinib; July 2020- CT Ab/Pelvis- CT scan shows no progressive/worsening lymphadenopathy.  Currently held ibrutinib because of progressive anemia [see discussion below].  White count/lymphocytosis slightly rising-concerning for recurrence.  Monitor for now  #Anemia-hemoglobin 8.3 question etiology.  Question CKD versus others-copper/folic acid deficiency.  Currently on supplementation.  Continue Aranesp today.  #Hyperkalemia potassium 5.9-recent admission to hospital for the same.  Creatinine stable at 1.3.  Recommend discontinuation of lisinopril/also give 1 dose of Kayexalate.  We will repeat BMP in 2 days.  # Abdominal pain/dyspepsia/unclear etiology; stable continue Percocet for now.  S/p extensive evaluation at [facility].  Would not recommend increasing the dose of narcotic medication.  # DISPOSITION: add LDH # aranesp today; # BMP in oct 2nd.  # in 2 weeks- MD-cbc/bmp--possible aranesp-Dr.B

## 2019-01-29 ENCOUNTER — Other Ambulatory Visit: Payer: Self-pay

## 2019-01-29 ENCOUNTER — Inpatient Hospital Stay: Payer: Medicare Other | Attending: Internal Medicine

## 2019-01-29 DIAGNOSIS — C9110 Chronic lymphocytic leukemia of B-cell type not having achieved remission: Secondary | ICD-10-CM | POA: Insufficient documentation

## 2019-01-29 DIAGNOSIS — C9192 Lymphoid leukemia, unspecified, in relapse: Secondary | ICD-10-CM

## 2019-01-29 DIAGNOSIS — D631 Anemia in chronic kidney disease: Secondary | ICD-10-CM | POA: Diagnosis not present

## 2019-01-29 DIAGNOSIS — N183 Chronic kidney disease, stage 3 unspecified: Secondary | ICD-10-CM | POA: Insufficient documentation

## 2019-01-29 DIAGNOSIS — C9112 Chronic lymphocytic leukemia of B-cell type in relapse: Secondary | ICD-10-CM

## 2019-01-29 LAB — BASIC METABOLIC PANEL
Anion gap: 7 (ref 5–15)
BUN: 24 mg/dL — ABNORMAL HIGH (ref 8–23)
CO2: 24 mmol/L (ref 22–32)
Calcium: 8.8 mg/dL — ABNORMAL LOW (ref 8.9–10.3)
Chloride: 112 mmol/L — ABNORMAL HIGH (ref 98–111)
Creatinine, Ser: 1.2 mg/dL (ref 0.61–1.24)
GFR calc Af Amer: >60 mL/min (ref >60)
GFR calc non Af Amer: >60 mL/min (ref >60)
Glucose, Bld: 105 mg/dL — ABNORMAL HIGH (ref 70–99)
Potassium: 5.4 mmol/L — ABNORMAL HIGH (ref 3.5–5.1)
Sodium: 143 mmol/L (ref 135–145)

## 2019-02-09 ENCOUNTER — Other Ambulatory Visit: Payer: Self-pay

## 2019-02-10 ENCOUNTER — Other Ambulatory Visit: Payer: Self-pay

## 2019-02-10 ENCOUNTER — Inpatient Hospital Stay (HOSPITAL_BASED_OUTPATIENT_CLINIC_OR_DEPARTMENT_OTHER): Payer: Medicare Other | Admitting: Internal Medicine

## 2019-02-10 ENCOUNTER — Inpatient Hospital Stay: Payer: Medicare Other

## 2019-02-10 VITALS — BP 117/58 | HR 52 | Temp 97.2°F | Resp 18 | Wt 154.4 lb

## 2019-02-10 DIAGNOSIS — C9192 Lymphoid leukemia, unspecified, in relapse: Secondary | ICD-10-CM | POA: Diagnosis not present

## 2019-02-10 DIAGNOSIS — C8300 Small cell B-cell lymphoma, unspecified site: Secondary | ICD-10-CM | POA: Diagnosis not present

## 2019-02-10 DIAGNOSIS — N183 Chronic kidney disease, stage 3 unspecified: Secondary | ICD-10-CM

## 2019-02-10 DIAGNOSIS — N184 Chronic kidney disease, stage 4 (severe): Secondary | ICD-10-CM | POA: Diagnosis not present

## 2019-02-10 DIAGNOSIS — C9110 Chronic lymphocytic leukemia of B-cell type not having achieved remission: Secondary | ICD-10-CM

## 2019-02-10 DIAGNOSIS — I70219 Atherosclerosis of native arteries of extremities with intermittent claudication, unspecified extremity: Secondary | ICD-10-CM | POA: Diagnosis not present

## 2019-02-10 DIAGNOSIS — C9112 Chronic lymphocytic leukemia of B-cell type in relapse: Secondary | ICD-10-CM

## 2019-02-10 DIAGNOSIS — D631 Anemia in chronic kidney disease: Secondary | ICD-10-CM

## 2019-02-10 DIAGNOSIS — E875 Hyperkalemia: Secondary | ICD-10-CM

## 2019-02-10 LAB — COMPREHENSIVE METABOLIC PANEL
ALT: 13 U/L (ref 0–44)
AST: 12 U/L — ABNORMAL LOW (ref 15–41)
Albumin: 4.4 g/dL (ref 3.5–5.0)
Alkaline Phosphatase: 86 U/L (ref 38–126)
Anion gap: 7 (ref 5–15)
BUN: 36 mg/dL — ABNORMAL HIGH (ref 8–23)
CO2: 23 mmol/L (ref 22–32)
Calcium: 9.2 mg/dL (ref 8.9–10.3)
Chloride: 109 mmol/L (ref 98–111)
Creatinine, Ser: 1.5 mg/dL — ABNORMAL HIGH (ref 0.61–1.24)
GFR calc Af Amer: 54 mL/min — ABNORMAL LOW (ref >60)
GFR calc non Af Amer: 47 mL/min — ABNORMAL LOW (ref >60)
Glucose, Bld: 147 mg/dL — ABNORMAL HIGH (ref 70–99)
Potassium: 5.8 mmol/L — ABNORMAL HIGH (ref 3.5–5.1)
Sodium: 139 mmol/L (ref 135–145)
Total Bilirubin: 0.4 mg/dL (ref 0.3–1.2)
Total Protein: 7.4 g/dL (ref 6.5–8.1)

## 2019-02-10 LAB — CBC WITH DIFFERENTIAL/PLATELET
Abs Immature Granulocytes: 0.03 10*3/uL (ref 0.00–0.07)
Basophils Absolute: 0.1 10*3/uL (ref 0.0–0.1)
Basophils Relative: 1 %
Eosinophils Absolute: 0.3 10*3/uL (ref 0.0–0.5)
Eosinophils Relative: 2 %
HCT: 28.2 % — ABNORMAL LOW (ref 39.0–52.0)
Hemoglobin: 8.8 g/dL — ABNORMAL LOW (ref 13.0–17.0)
Immature Granulocytes: 0 %
Lymphocytes Relative: 59 %
Lymphs Abs: 10.4 10*3/uL — ABNORMAL HIGH (ref 0.7–4.0)
MCH: 33 pg (ref 26.0–34.0)
MCHC: 31.2 g/dL (ref 30.0–36.0)
MCV: 105.6 fL — ABNORMAL HIGH (ref 80.0–100.0)
Monocytes Absolute: 2.2 10*3/uL — ABNORMAL HIGH (ref 0.1–1.0)
Monocytes Relative: 13 %
Neutro Abs: 4.3 10*3/uL (ref 1.7–7.7)
Neutrophils Relative %: 25 %
Platelets: 183 10*3/uL (ref 150–400)
RBC: 2.67 MIL/uL — ABNORMAL LOW (ref 4.22–5.81)
RDW: 18.5 % — ABNORMAL HIGH (ref 11.5–15.5)
Smear Review: NORMAL
WBC: 17.4 10*3/uL — ABNORMAL HIGH (ref 4.0–10.5)
nRBC: 0 % (ref 0.0–0.2)

## 2019-02-10 LAB — LACTATE DEHYDROGENASE: LDH: 136 U/L (ref 98–192)

## 2019-02-10 MED ORDER — SODIUM POLYSTYRENE SULFONATE 15 GM/60ML PO SUSP: 15.0000 g | Freq: Once | ORAL | 0 refills | Status: AC

## 2019-02-10 MED ORDER — OXYCODONE-ACETAMINOPHEN 5-325 MG PO TABS: ORAL_TABLET | ORAL | 0 refills | Status: DC

## 2019-02-10 MED ORDER — DARBEPOETIN ALFA 300 MCG/0.6ML IJ SOSY
300.0000 ug | PREFILLED_SYRINGE | Freq: Once | INTRAMUSCULAR | Status: AC
  Administered 2019-02-10: 11:00:00 300 ug via SUBCUTANEOUS
  Filled 2019-02-10: qty 0.6

## 2019-02-10 NOTE — Progress Notes (Signed)
James [name] is here for Aranesp today. He is complaining of abdominal pain, loss of appetite, and weakness this morning.

## 2019-02-10 NOTE — Progress Notes (Signed)
[facility] [place] OFFICE PROGRESS NOTE  Patient Care Team: [name], MD as PCP - General (Internal Medicine)  Cancer Staging No matching staging information was found for the patient.   Oncology History Overview Note  # 2006- CLL STAGE IV; MAY 2011- WBC- 57K;Platelets-99;Hb-12/CT Bulky LN; BMBx- 80% Invol; del 11; START Benda-Ritux x4 [finished Sep 2011];   # July 2015-Progression; Sep 2015-START ibrutinib; CT scan DEC 2015- Improvement LN; Cont Ibrutinib 159m/d; NOV 2016 CT- 1-2CM LN [mild progression compared to Dec 2015];NOV 2016- FISH peripheral blood- NO MUTATIONS/CD-38 Positive; NOV 7th- CONT IBRUTINIB 2 pills/day; CT AUG 2017- STABLE;  DEC 6th PET- Mild RP LN/ Retrocrural LN  # OFF ibrutinib [? intol]- sep 2019- Jan 16th 2020; Re-start Ibrutinib; September 2020-stop ibrutinib [poor tolerance/worsening anemia]  # Jan 16th 2020- start aranesp  # DEC 2019- PAIN CONTRACT  #October 2019-bone marrow biopsy [worsening anemia]-question dyserythropoietic changes/small clone of CLL;  # [facility] HEM- NEG.  SA skin infection [Oct 2016] s/p clinda -------------------------------------------------------    DIAGNOSIS: CLL  STAGE:   IV      ;GOALS: palliative  CURRENT/MOST RECENT THERAPY : Ibrutinib.    CLL (chronic lymphocytic leukemia) (HCC)    INTERVAL HISTORY:  James [name] 68y.o.  male pleasant patient above history of CLL [currently off ibrutinib] and chronic abdominal pain of unclear etiology; worsening anemia of unclear etiology/? CKD is here for follow-up.  Patient overall continues to feel poorly.  Continues to have chronic abdominal pain.  Not any worse.  Intermittent constipation.  No nausea no vomiting.  He states to be on folic acid and copper supplements.   Appetite is poor.  Weight is stable.  Review of Systems  Constitutional: Positive for malaise/fatigue and weight loss. Negative for chills, diaphoresis and fever.  HENT:  Negative for nosebleeds and sore throat.   Eyes: Negative for double vision.  Respiratory: Negative for cough, hemoptysis, sputum production, shortness of breath and wheezing.   Cardiovascular: Negative for chest pain, palpitations, orthopnea and leg swelling.  Gastrointestinal: Positive for abdominal pain, constipation and nausea. Negative for blood in stool, diarrhea, heartburn, melena and vomiting.  Genitourinary: Negative for dysuria, frequency and urgency.  Skin: Negative.  Negative for itching and rash.  Neurological: Negative for dizziness, tingling, focal weakness, weakness and headaches.  Endo/Heme/Allergies: Does not bruise/bleed easily.  Psychiatric/Behavioral: Negative for depression. The patient is not nervous/anxious and does not have insomnia.       PAST MEDICAL HISTORY :  Past Medical History:  Diagnosis Date  . CLL (chronic lymphocytic leukemia) ([facility]   . Diabetes mellitus without complication ([place]   . Hematuria   . Hypertension     PAST SURGICAL HISTORY :   Past Surgical History:  Procedure Laterality Date  . CATARACT EXTRACTION W/ INTRAOCULAR LENS IMPLANT Bilateral   . CHOLECYSTECTOMY  1983  . COLONOSCOPY WITH PROPOFOL N/A 10/27/2017   Procedure: COLONOSCOPY WITH PROPOFOL;  Surgeon: EManya Silvas MD;  [facility]  Service: Endoscopy;  Laterality: N/A;  . ESOPHAGOGASTRODUODENOSCOPY (EGD) WITH PROPOFOL N/A 11/20/2016   Procedure: ESOPHAGOGASTRODUODENOSCOPY (EGD) WITH PROPOFOL;  Surgeon: EManya Silvas MD;  [facility];  Service: Endoscopy;  Laterality: N/A;  . ESOPHAGOGASTRODUODENOSCOPY (EGD) WITH PROPOFOL N/A 10/27/2017   Procedure: ESOPHAGOGASTRODUODENOSCOPY (EGD) WITH PROPOFOL;  Surgeon: EManya Silvas MD;  [facility]  Service: Endoscopy;  Laterality: N/A;  . LOWER EXTREMITY ANGIOGRAPHY Right 05/19/2017   Procedure: LOWER EXTREMITY ANGIOGRAPHY;  Surgeon: DAlgernon Huxley  MD;  Location: [place] [facility];  Service:  Cardiovascular;  Laterality: Right;  . LOWER EXTREMITY ANGIOGRAPHY Left 08/10/2018   Procedure: LOWER EXTREMITY ANGIOGRAPHY;  Surgeon: Algernon Huxley, MD;  Location: [place] [facility];  Service: Cardiovascular;  Laterality: Left;    FAMILY HISTORY :   Family History  Problem Relation Age of Onset  . Hypertension Sister     SOCIAL HISTORY:   Social History   Tobacco Use  . Smoking status: Current Every Day Smoker    Packs/day: 0.50    Years: 47.00    Pack years: 23.50    Types: Cigarettes  . Smokeless tobacco: Never Used  Substance Use Topics  . Alcohol use: Yes    Alcohol/week: 1.0 standard drinks    Types: 1 Glasses of wine per week    Comment:  almost none in last 6 months  . Drug use: No    ALLERGIES:  has No Known Allergies.  MEDICATIONS:  Current Outpatient Medications  Medication Sig Dispense Refill  . amLODipine (NORVASC) 10 MG tablet Take 1 tablet by mouth once daily 30 tablet 4  . aspirin EC 81 MG tablet Take 81 mg by mouth daily.    Marland Kitchen atorvastatin (LIPITOR) 10 MG tablet Take 1 tablet (10 mg total) by mouth daily. 30 tablet 11  . clopidogrel (PLAVIX) 75 MG tablet Take 1 tablet by mouth once daily 90 tablet 0  . dicyclomine (BENTYL) 10 MG capsule Take 10 mg by mouth 4 (four) times daily -  before meals and at bedtime.    . folic acid (FOLVITE) 1 MG tablet Take 1 tablet (1 mg total) by mouth daily. 30 tablet 1  . iron polysaccharides (NU-IRON) 150 MG capsule Take 150 mg by mouth daily.     Marland Kitchen lisinopril (ZESTRIL) 10 MG tablet Take 1 tablet (10 mg total) by mouth daily. 30 tablet 0  . metoprolol tartrate (LOPRESSOR) 25 MG tablet Take 1 tablet by mouth twice daily 180 tablet 0  . oxyCODONE-acetaminophen (PERCOCET/ROXICET) 5-325 MG tablet 1 pill every 8-12 hours 90 tablet 0  . IMBRUVICA 280 MG TABS TAKE 1 TABLET BY MOUTH DAILY. (Patient not taking: No sig reported) 28 tablet 6  . metFORMIN (GLUCOPHAGE) 1000 MG tablet Take 1,000 mg by mouth daily with breakfast.     .  sodium polystyrene (KAYEXALATE) 15 GM/60ML suspension Take 60 mLs (15 g total) by mouth once for 1 dose. Take once a week. 60 mL 0   No current facility-administered medications for this visit.    Facility-Administered Medications Ordered in Other Visits  Medication Dose Route Frequency Provider Last Rate Last Dose  . Darbepoetin Alfa (ARANESP) injection 300 mcg  300 mcg Subcutaneous Once Cammie Sickle, MD        PHYSICAL EXAMINATION: ECOG PERFORMANCE STATUS: 1 - Symptomatic but completely ambulatory  BP (!) 117/58 (BP Location: Left Arm)   Pulse (!) 52   Temp (!) 97.2 F (36.2 C)   Resp 18   Wt 154 lb 6.4 oz (70 kg)   BMI 23.48 kg/m   Filed Weights   02/09/19 1400  Weight: 154 lb 6.4 oz (70 kg)    Physical Exam  Constitutional: He is oriented to person, place, and time and well-developed, well-nourished, and in no distress.  He is alone.  Appears pale.  HENT:  Head: Normocephalic and atraumatic.  Mouth/Throat: Oropharynx is clear and moist. No oropharyngeal exudate.  Eyes: Pupils are equal, round, and reactive to light.  Neck: Normal range of motion. Neck supple.  Cardiovascular: Normal rate and regular rhythm.  Pulmonary/Chest: No respiratory distress. He has no wheezes.  Decreased air entry bil.   Abdominal: Soft. Bowel sounds are normal. He exhibits no distension and no mass. There is no abdominal tenderness. There is no rebound and no guarding.  Musculoskeletal: Normal range of motion.        General: No tenderness or edema.  Neurological: He is alert and oriented to person, place, and time.  Skin: Skin is warm. There is pallor.  Multiple bruises noted in bilateral upper extremity.  Psychiatric: Affect normal.      LABORATORY DATA:  I have reviewed the data as listed    Component Value Date/Time   NA 139 02/10/2019 0931   NA 142 05/03/2014 1139   K 5.8 (H) 02/10/2019 0931   K 4.7 05/03/2014 1139   CL 109 02/10/2019 0931   CL 110 (H) 05/03/2014  1139   CO2 23 02/10/2019 0931   CO2 24 05/03/2014 1139   GLUCOSE 147 (H) 02/10/2019 0931   GLUCOSE 98 05/03/2014 1139   BUN 36 (H) 02/10/2019 0931   BUN 20 (H) 05/03/2014 1139   CREATININE 1.50 (H) 02/10/2019 0931   CREATININE 1.22 08/09/2014 1122   CALCIUM 9.2 02/10/2019 0931   CALCIUM 8.6 05/03/2014 1139   PROT 7.4 02/10/2019 0931   PROT 7.5 05/03/2014 1139   ALBUMIN 4.4 02/10/2019 0931   ALBUMIN 4.1 05/03/2014 1139   AST 12 (L) 02/10/2019 0931   AST 15 05/03/2014 1139   ALT 13 02/10/2019 0931   ALT 26 05/03/2014 1139   ALKPHOS 86 02/10/2019 0931   ALKPHOS 94 05/03/2014 1139   BILITOT 0.4 02/10/2019 0931   BILITOT 0.5 05/03/2014 1139   GFRNONAA 47 (L) 02/10/2019 0931   GFRNONAA >60 08/09/2014 1122   GFRAA 54 (L) 02/10/2019 0931   GFRAA >60 08/09/2014 1122    No results found for: SPEP, UPEP  Lab Results  Component Value Date   WBC 17.4 (H) 02/10/2019   NEUTROABS 4.3 02/10/2019   HGB 8.8 (L) 02/10/2019   HCT 28.2 (L) 02/10/2019   MCV 105.6 (H) 02/10/2019   PLT 183 02/10/2019      Chemistry      Component Value Date/Time   NA 139 02/10/2019 0931   NA 142 05/03/2014 1139   K 5.8 (H) 02/10/2019 0931   K 4.7 05/03/2014 1139   CL 109 02/10/2019 0931   CL 110 (H) 05/03/2014 1139   CO2 23 02/10/2019 0931   CO2 24 05/03/2014 1139   BUN 36 (H) 02/10/2019 0931   BUN 20 (H) 05/03/2014 1139   CREATININE 1.50 (H) 02/10/2019 0931   CREATININE 1.22 08/09/2014 1122      Component Value Date/Time   CALCIUM 9.2 02/10/2019 0931   CALCIUM 8.6 05/03/2014 1139   ALKPHOS 86 02/10/2019 0931   ALKPHOS 94 05/03/2014 1139   AST 12 (L) 02/10/2019 0931   AST 15 05/03/2014 1139   ALT 13 02/10/2019 0931   ALT 26 05/03/2014 1139   BILITOT 0.4 02/10/2019 0931   BILITOT 0.5 05/03/2014 1139       RADIOGRAPHIC STUDIES: I have personally reviewed the radiological images as listed and agreed with the findings in the report. No results found.   ASSESSMENT & PLAN:  CLL (chronic  lymphocytic leukemia) ([place]) # CLL/SLL- relapsed most recently on ibrutinib; July 2020- CT Ab/Pelvis- CT scan shows no progressive/worsening lymphadenopathy.  Currently held ibrutinib because  of progressive anemia [see discussion below].  White count/lymphocytosis slightly rising-concerning for recurrence.  #Poor tolerance to ibrutinib/question also progression.  Recommend discontinuation of ibrutinib.  Plan Gazyva-if progressive disease noted/at subsequent visits.  Hold off venetoclax given patient's tenuous renal function/hyperkalemia.  #Anemia-hemoglobin 8.3 question etiology.  Question CKD versus others-copper/folic acid deficiency/CLL.  Continue with Aranesp.  #Hyperkalemia potassium 5.8 today.  Creatinine stable at 1.3.  Kayexalate every week.;  Will make a referral to nephrology.  Discontinue lisinopril 5 mg.  # Abdominal pain/dyspepsia/unclear etiology; stable continue Percocet for now.  S/p extensive evaluation at [facility].  Continue Percocet at the current dose.  # DISPOSITION: # referral to Dr.Singh/Neprhology re: CKD/ Hyperkalemia-ASAP #  aranesp today; # in 2 weeks- lab- cbc/bmp- Aranesp # in 4 weeks- lab- cbc/bmp- Aranesp # in 6 weeks- MD-cbc/bmp--possible aranesp-Dr.B    Orders Placed This Encounter  Procedures  . CBC with Differential    Standing Status:   Standing    Number of Occurrences:   20    Standing Expiration Date:   02/10/2020  . Basic metabolic panel    Standing Status:   Standing    Number of Occurrences:   20    Standing Expiration Date:   02/10/2020  . Ambulatory referral to Nephrology    Referral Priority:   Urgent    Referral Type:   Consultation    Referral Reason:   Specialty Services Required    Referred to Provider:   Murlean Iba, MD    Requested Specialty:   Nephrology    Number of Visits Requested:   1   All questions were answered. The patient knows to call the clinic with any problems, questions or concerns.      Cammie Sickle,  MD 02/10/2019 10:33 AM

## 2019-02-10 NOTE — Assessment & Plan Note (Addendum)
#   CLL/SLL- relapsed most recently on ibrutinib; July 2020- CT Ab/Pelvis- CT scan shows no progressive/worsening lymphadenopathy.  Currently held ibrutinib because of progressive anemia [see discussion below].  White count/lymphocytosis slightly rising-concerning for recurrence.  #Poor tolerance to ibrutinib/question also progression.  Recommend discontinuation of ibrutinib.  Plan Gazyva-if progressive disease noted/at subsequent visits.  Hold off venetoclax given patient's tenuous renal function/hyperkalemia.  #Anemia-hemoglobin 8.3 question etiology.  Question CKD versus others-copper/folic acid deficiency/CLL.  Continue with Aranesp.  #Hyperkalemia potassium 5.8 today.  Creatinine stable at 1.3.  Kayexalate every week.;  Will make a referral to nephrology.  Discontinue lisinopril 5 mg.  # Abdominal pain/dyspepsia/unclear etiology; stable continue Percocet for now.  S/p extensive evaluation at [facility].  Continue Percocet at the current dose.  # DISPOSITION: # referral to Dr.Singh/Neprhology re: CKD/ Hyperkalemia-ASAP #  aranesp today; # in 2 weeks- lab- cbc/bmp- Aranesp # in 4 weeks- lab- cbc/bmp- Aranesp # in 6 weeks- MD-cbc/bmp--possible aranesp-Dr.B

## 2019-02-17 MED FILL — IMBRUVICA 280 MG TAB: 280 | 28 days supply | Qty: 28 | Fill #3

## 2019-02-18 ENCOUNTER — Other Ambulatory Visit (INDEPENDENT_AMBULATORY_CARE_PROVIDER_SITE_OTHER): Payer: Self-pay | Admitting: Vascular Surgery

## 2019-02-24 ENCOUNTER — Inpatient Hospital Stay: Payer: Medicare Other

## 2019-02-24 ENCOUNTER — Other Ambulatory Visit: Payer: Self-pay

## 2019-02-24 VITALS — BP 135/70 | HR 59

## 2019-02-24 DIAGNOSIS — N184 Chronic kidney disease, stage 4 (severe): Secondary | ICD-10-CM

## 2019-02-24 DIAGNOSIS — N183 Chronic kidney disease, stage 3 unspecified: Secondary | ICD-10-CM | POA: Diagnosis not present

## 2019-02-24 DIAGNOSIS — C9110 Chronic lymphocytic leukemia of B-cell type not having achieved remission: Secondary | ICD-10-CM

## 2019-02-24 DIAGNOSIS — D631 Anemia in chronic kidney disease: Secondary | ICD-10-CM

## 2019-02-24 DIAGNOSIS — E875 Hyperkalemia: Secondary | ICD-10-CM

## 2019-02-24 LAB — BASIC METABOLIC PANEL
Anion gap: 9 (ref 5–15)
BUN: 38 mg/dL — ABNORMAL HIGH (ref 8–23)
CO2: 24 mmol/L (ref 22–32)
Calcium: 8.9 mg/dL (ref 8.9–10.3)
Chloride: 108 mmol/L (ref 98–111)
Creatinine, Ser: 1.63 mg/dL — ABNORMAL HIGH (ref 0.61–1.24)
GFR calc Af Amer: 49 mL/min — ABNORMAL LOW (ref >60)
GFR calc non Af Amer: 42 mL/min — ABNORMAL LOW (ref >60)
Glucose, Bld: 114 mg/dL — ABNORMAL HIGH (ref 70–99)
Potassium: 5.9 mmol/L — ABNORMAL HIGH (ref 3.5–5.1)
Sodium: 141 mmol/L (ref 135–145)

## 2019-02-24 LAB — CBC WITH DIFFERENTIAL/PLATELET
Abs Immature Granulocytes: 0.02 10*3/uL (ref 0.00–0.07)
Basophils Absolute: 0.1 10*3/uL (ref 0.0–0.1)
Basophils Relative: 1 %
Eosinophils Absolute: 0.2 10*3/uL (ref 0.0–0.5)
Eosinophils Relative: 1 %
HCT: 26 % — ABNORMAL LOW (ref 39.0–52.0)
Hemoglobin: 8.1 g/dL — ABNORMAL LOW (ref 13.0–17.0)
Immature Granulocytes: 0 %
Lymphocytes Relative: 54 %
Lymphs Abs: 6.4 10*3/uL — ABNORMAL HIGH (ref 0.7–4.0)
MCH: 33.8 pg (ref 26.0–34.0)
MCHC: 31.2 g/dL (ref 30.0–36.0)
MCV: 108.3 fL — ABNORMAL HIGH (ref 80.0–100.0)
Monocytes Absolute: 2 10*3/uL — ABNORMAL HIGH (ref 0.1–1.0)
Monocytes Relative: 17 %
Neutro Abs: 3.2 10*3/uL (ref 1.7–7.7)
Neutrophils Relative %: 27 %
Platelets: 117 10*3/uL — ABNORMAL LOW (ref 150–400)
RBC: 2.4 MIL/uL — ABNORMAL LOW (ref 4.22–5.81)
RDW: 18.7 % — ABNORMAL HIGH (ref 11.5–15.5)
Smear Review: NORMAL
WBC: 11.9 10*3/uL — ABNORMAL HIGH (ref 4.0–10.5)
nRBC: 0 % (ref 0.0–0.2)

## 2019-02-24 MED ORDER — DARBEPOETIN ALFA 300 MCG/0.6ML IJ SOSY
300.0000 ug | PREFILLED_SYRINGE | Freq: Once | INTRAMUSCULAR | Status: AC
  Administered 2019-02-24: 11:00:00 300 ug via SUBCUTANEOUS
  Filled 2019-02-24: qty 0.6

## 2019-03-08 DIAGNOSIS — F330 Major depressive disorder, recurrent, mild: Secondary | ICD-10-CM | POA: Insufficient documentation

## 2019-03-10 ENCOUNTER — Inpatient Hospital Stay: Payer: Medicare Other

## 2019-03-10 ENCOUNTER — Inpatient Hospital Stay: Payer: Medicare Other | Attending: Internal Medicine

## 2019-03-10 ENCOUNTER — Other Ambulatory Visit: Payer: Self-pay

## 2019-03-10 VITALS — BP 129/67 | HR 49

## 2019-03-10 DIAGNOSIS — C9110 Chronic lymphocytic leukemia of B-cell type not having achieved remission: Secondary | ICD-10-CM

## 2019-03-10 DIAGNOSIS — D631 Anemia in chronic kidney disease: Secondary | ICD-10-CM | POA: Insufficient documentation

## 2019-03-10 DIAGNOSIS — N184 Chronic kidney disease, stage 4 (severe): Secondary | ICD-10-CM

## 2019-03-10 DIAGNOSIS — E875 Hyperkalemia: Secondary | ICD-10-CM

## 2019-03-10 DIAGNOSIS — N189 Chronic kidney disease, unspecified: Secondary | ICD-10-CM | POA: Diagnosis present

## 2019-03-10 LAB — BASIC METABOLIC PANEL
Anion gap: 5 (ref 5–15)
BUN: 31 mg/dL — ABNORMAL HIGH (ref 8–23)
CO2: 22 mmol/L (ref 22–32)
Calcium: 8.9 mg/dL (ref 8.9–10.3)
Chloride: 110 mmol/L (ref 98–111)
Creatinine, Ser: 1.25 mg/dL — ABNORMAL HIGH (ref 0.61–1.24)
GFR calc Af Amer: >60 mL/min (ref >60)
GFR calc non Af Amer: 58 mL/min — ABNORMAL LOW (ref >60)
Glucose, Bld: 125 mg/dL — ABNORMAL HIGH (ref 70–99)
Potassium: 5.3 mmol/L — ABNORMAL HIGH (ref 3.5–5.1)
Sodium: 137 mmol/L (ref 135–145)

## 2019-03-10 LAB — CBC WITH DIFFERENTIAL/PLATELET
Abs Immature Granulocytes: 0.04 10*3/uL (ref 0.00–0.07)
Basophils Absolute: 0.1 10*3/uL (ref 0.0–0.1)
Basophils Relative: 1 %
Eosinophils Absolute: 0.2 10*3/uL (ref 0.0–0.5)
Eosinophils Relative: 1 %
HCT: 27.8 % — ABNORMAL LOW (ref 39.0–52.0)
Hemoglobin: 8.4 g/dL — ABNORMAL LOW (ref 13.0–17.0)
Immature Granulocytes: 0 %
Lymphocytes Relative: 63 %
Lymphs Abs: 11.1 10*3/uL — ABNORMAL HIGH (ref 0.7–4.0)
MCH: 33.1 pg (ref 26.0–34.0)
MCHC: 30.2 g/dL (ref 30.0–36.0)
MCV: 109.4 fL — ABNORMAL HIGH (ref 80.0–100.0)
Monocytes Absolute: 2.4 10*3/uL — ABNORMAL HIGH (ref 0.1–1.0)
Monocytes Relative: 14 %
Neutro Abs: 3.7 10*3/uL (ref 1.7–7.7)
Neutrophils Relative %: 21 %
Platelets: 153 10*3/uL (ref 150–400)
RBC: 2.54 MIL/uL — ABNORMAL LOW (ref 4.22–5.81)
RDW: 18 % — ABNORMAL HIGH (ref 11.5–15.5)
Smear Review: ADEQUATE
WBC: 17.6 10*3/uL — ABNORMAL HIGH (ref 4.0–10.5)
nRBC: 0 % (ref 0.0–0.2)

## 2019-03-10 MED ORDER — DARBEPOETIN ALFA 300 MCG/0.6ML IJ SOSY
300.0000 ug | PREFILLED_SYRINGE | Freq: Once | INTRAMUSCULAR | Status: AC
  Administered 2019-03-10: 300 ug via SUBCUTANEOUS
  Filled 2019-03-10: qty 0.6

## 2019-03-15 ENCOUNTER — Other Ambulatory Visit: Payer: Self-pay | Admitting: *Deleted

## 2019-03-15 DIAGNOSIS — C9112 Chronic lymphocytic leukemia of B-cell type in relapse: Secondary | ICD-10-CM

## 2019-03-15 DIAGNOSIS — C8300 Small cell B-cell lymphoma, unspecified site: Secondary | ICD-10-CM

## 2019-03-15 DIAGNOSIS — C9192 Lymphoid leukemia, unspecified, in relapse: Secondary | ICD-10-CM

## 2019-03-15 MED ORDER — OXYCODONE-ACETAMINOPHEN 5-325 MG PO TABS: ORAL_TABLET | ORAL | 0 refills | Status: DC

## 2019-03-16 MED FILL — IMBRUVICA 280 MG TAB: 280 | 28 days supply | Qty: 28 | Fill #4

## 2019-03-23 NOTE — Progress Notes (Signed)
Patient is coming in for follow up, he mentions some abdominal pain he says 4/10 and come and goes.

## 2019-03-24 ENCOUNTER — Inpatient Hospital Stay: Payer: Medicare Other

## 2019-03-24 ENCOUNTER — Inpatient Hospital Stay (HOSPITAL_BASED_OUTPATIENT_CLINIC_OR_DEPARTMENT_OTHER): Payer: Medicare Other | Admitting: Internal Medicine

## 2019-03-24 ENCOUNTER — Other Ambulatory Visit: Payer: Self-pay

## 2019-03-24 VITALS — BP 139/74 | HR 56 | Temp 97.1°F | Ht 68.0 in | Wt 155.8 lb

## 2019-03-24 DIAGNOSIS — D539 Nutritional anemia, unspecified: Secondary | ICD-10-CM

## 2019-03-24 DIAGNOSIS — N189 Chronic kidney disease, unspecified: Secondary | ICD-10-CM | POA: Diagnosis not present

## 2019-03-24 DIAGNOSIS — C9110 Chronic lymphocytic leukemia of B-cell type not having achieved remission: Secondary | ICD-10-CM

## 2019-03-24 DIAGNOSIS — G8929 Other chronic pain: Secondary | ICD-10-CM

## 2019-03-24 DIAGNOSIS — E875 Hyperkalemia: Secondary | ICD-10-CM

## 2019-03-24 DIAGNOSIS — D631 Anemia in chronic kidney disease: Secondary | ICD-10-CM

## 2019-03-24 DIAGNOSIS — N184 Chronic kidney disease, stage 4 (severe): Secondary | ICD-10-CM

## 2019-03-24 DIAGNOSIS — R109 Unspecified abdominal pain: Secondary | ICD-10-CM

## 2019-03-24 DIAGNOSIS — I70219 Atherosclerosis of native arteries of extremities with intermittent claudication, unspecified extremity: Secondary | ICD-10-CM

## 2019-03-24 LAB — BASIC METABOLIC PANEL
Anion gap: 7 (ref 5–15)
BUN: 33 mg/dL — ABNORMAL HIGH (ref 8–23)
CO2: 22 mmol/L (ref 22–32)
Calcium: 8.8 mg/dL — ABNORMAL LOW (ref 8.9–10.3)
Chloride: 108 mmol/L (ref 98–111)
Creatinine, Ser: 1.31 mg/dL — ABNORMAL HIGH (ref 0.61–1.24)
GFR calc Af Amer: >60 mL/min (ref >60)
GFR calc non Af Amer: 55 mL/min — ABNORMAL LOW (ref >60)
Glucose, Bld: 119 mg/dL — ABNORMAL HIGH (ref 70–99)
Potassium: 5.6 mmol/L — ABNORMAL HIGH (ref 3.5–5.1)
Sodium: 137 mmol/L (ref 135–145)

## 2019-03-24 LAB — CBC WITH DIFFERENTIAL/PLATELET
Abs Immature Granulocytes: 0.04 10*3/uL (ref 0.00–0.07)
Basophils Absolute: 0.2 10*3/uL — ABNORMAL HIGH (ref 0.0–0.1)
Basophils Relative: 1 %
Eosinophils Absolute: 0.2 10*3/uL (ref 0.0–0.5)
Eosinophils Relative: 1 %
HCT: 28.5 % — ABNORMAL LOW (ref 39.0–52.0)
Hemoglobin: 8.6 g/dL — ABNORMAL LOW (ref 13.0–17.0)
Immature Granulocytes: 0 %
Lymphocytes Relative: 66 %
Lymphs Abs: 11.1 10*3/uL — ABNORMAL HIGH (ref 0.7–4.0)
MCH: 33.2 pg (ref 26.0–34.0)
MCHC: 30.2 g/dL (ref 30.0–36.0)
MCV: 110 fL — ABNORMAL HIGH (ref 80.0–100.0)
Monocytes Absolute: 1.6 10*3/uL — ABNORMAL HIGH (ref 0.1–1.0)
Monocytes Relative: 9 %
Neutro Abs: 3.9 10*3/uL (ref 1.7–7.7)
Neutrophils Relative %: 23 %
Platelets: 112 10*3/uL — ABNORMAL LOW (ref 150–400)
RBC: 2.59 MIL/uL — ABNORMAL LOW (ref 4.22–5.81)
RDW: 16.5 % — ABNORMAL HIGH (ref 11.5–15.5)
Smear Review: NORMAL
WBC: 16.9 10*3/uL — ABNORMAL HIGH (ref 4.0–10.5)
nRBC: 0 % (ref 0.0–0.2)

## 2019-03-24 MED ORDER — DARBEPOETIN ALFA 300 MCG/0.6ML IJ SOSY
300.0000 ug | PREFILLED_SYRINGE | Freq: Once | INTRAMUSCULAR | Status: AC
  Administered 2019-03-24: 300 ug via SUBCUTANEOUS
  Filled 2019-03-24: qty 0.6

## 2019-03-24 NOTE — Assessment & Plan Note (Addendum)
#   CLL/SLL- relapsed most recently on ibrutinib; July 2020- CT Ab/Pelvis- CT scan shows no progressive/worsening lymphadenopathy.  Currently held ibrutinib because of progressive anemia [see discussion below].  White count-17/lymphocytosis slightly rising-concerning for recurrence.  #Given the lack of convincing evidence of significant lymphadenopathy-and multiple other medical issues [renal insufficiency/anemia unrelated to CLL]-I think it is reasonable to hold off ibrutinib monitor closely.  Next option would be Gazyva.  #Anemia-hemoglobin 8.6 question etiology.  Question CKD versus others-copper/folic acid deficiency/CLL.  Will recheck copper/folic acid next visit.  Continue with Aranesp.  #Hyperkalemia potassium 5. 6 today.  Creatinine stable at 1.3.  Continue follow-up with nephrology.  Reviewed appreciate nephrology evaluation.  # Abdominal pain/dyspepsia/unclear etiology; stable.  Percocet for now.  S/p extensive evaluation at [facility].  Continue Percocet at the current dose. Interested in pain clinic referral.  # DISPOSITION: # referral to Pain clinic- re: chronic abdominal pain #  aranesp today; # in 2 weeks- lab- cbc/bmp- Aranesp # in 4 weeks- lab- cbc/bmp- Aranesp # in 6 weeks- MD-cbc/bmp--possible aranesp-Dr.B

## 2019-03-24 NOTE — Progress Notes (Signed)
[facility] [place] OFFICE PROGRESS NOTE  Patient Care Team: [name], MD as PCP - General (Internal Medicine)  Cancer Staging No matching staging information was found for the patient.   Oncology History Overview Note  # 2006- CLL STAGE IV; MAY 2011- WBC- 57K;Platelets-99;Hb-12/CT Bulky LN; BMBx- 80% Invol; del 11; START Benda-Ritux x4 [finished Sep 2011];   # July 2015-Progression; Sep 2015-START ibrutinib; CT scan DEC 2015- Improvement LN; Cont Ibrutinib 182m/d; NOV 2016 CT- 1-2CM LN [mild progression compared to Dec 2015];NOV 2016- FISH peripheral blood- NO MUTATIONS/CD-38 Positive; NOV 7th- CONT IBRUTINIB 2 pills/day; CT AUG 2017- STABLE;  DEC 6th PET- Mild RP LN/ Retrocrural LN  # OFF ibrutinib [? intol]- sep 2019- Jan 16th 2020; Re-start Ibrutinib; September 2020-stop ibrutinib [poor tolerance/worsening anemia]  # Jan 16th 2020- start aranesp  # DEC 2019- PAIN CONTRACT  #October 2019-bone marrow biopsy [worsening anemia]-question dyserythropoietic changes/small clone of CLL;  # [facility] HEM- NEG.  SA skin infection [Oct 2016] s/p clinda -------------------------------------------------------    DIAGNOSIS: CLL  STAGE:   IV      ;GOALS: palliative  CURRENT/MOST RECENT THERAPY : Ibrutinib.    CLL (chronic lymphocytic leukemia) (HCC)    INTERVAL HISTORY:  James [name] 68y.o.  male pleasant patient above history of CLL [currently off ibrutinib] and chronic abdominal pain of unclear etiology; worsening anemia of unclear etiology/? CKD is here for follow-up.  In the interim patient was evaluated by nephrology given his worsening renal function/hyperkalemia.   Continues to feel fair.  Continues to have chronic abdominal pain.  Intermittent constipation no diarrhea. Appetite is poor.  Weight is stable.  Review of Systems  Constitutional: Positive for malaise/fatigue and weight loss. Negative for chills, diaphoresis and fever.  HENT:  Negative for nosebleeds and sore throat.   Eyes: Negative for double vision.  Respiratory: Negative for cough, hemoptysis, sputum production, shortness of breath and wheezing.   Cardiovascular: Negative for chest pain, palpitations, orthopnea and leg swelling.  Gastrointestinal: Positive for abdominal pain, constipation and nausea. Negative for blood in stool, diarrhea, heartburn, melena and vomiting.  Genitourinary: Negative for dysuria, frequency and urgency.  Skin: Negative.  Negative for itching and rash.  Neurological: Negative for dizziness, tingling, focal weakness, weakness and headaches.  Endo/Heme/Allergies: Does not bruise/bleed easily.  Psychiatric/Behavioral: Negative for depression. The patient is not nervous/anxious and does not have insomnia.       PAST MEDICAL HISTORY :  Past Medical History:  Diagnosis Date  . CLL (chronic lymphocytic leukemia) ([facility]   . Diabetes mellitus without complication ([facility]   . Hematuria   . Hypertension     PAST SURGICAL HISTORY :   Past Surgical History:  Procedure Laterality Date  . CATARACT EXTRACTION W/ INTRAOCULAR LENS IMPLANT Bilateral   . CHOLECYSTECTOMY  1983  . COLONOSCOPY WITH PROPOFOL N/A 10/27/2017   Procedure: COLONOSCOPY WITH PROPOFOL;  Surgeon: EManya Silvas MD;  [facility];  Service: Endoscopy;  Laterality: N/A;  . ESOPHAGOGASTRODUODENOSCOPY (EGD) WITH PROPOFOL N/A 11/20/2016   Procedure: ESOPHAGOGASTRODUODENOSCOPY (EGD) WITH PROPOFOL;  Surgeon: EManya Silvas MD;  [facility];  Service: Endoscopy;  Laterality: N/A;  . ESOPHAGOGASTRODUODENOSCOPY (EGD) WITH PROPOFOL N/A 10/27/2017   Procedure: ESOPHAGOGASTRODUODENOSCOPY (EGD) WITH PROPOFOL;  Surgeon: EManya Silvas MD;  [facility];  Service: Endoscopy;  Laterality: N/A;  . LOWER EXTREMITY ANGIOGRAPHY Right 05/19/2017   Procedure: LOWER EXTREMITY ANGIOGRAPHY;  Surgeon: DAlgernon Huxley MD;  [facility];  Service:  Cardiovascular;  Laterality: Right;  . LOWER EXTREMITY ANGIOGRAPHY Left 08/10/2018   Procedure: LOWER EXTREMITY ANGIOGRAPHY;  Surgeon: Algernon Huxley, MD;  Location: [facility];  Service: Cardiovascular;  Laterality: Left;    FAMILY HISTORY :   Family History  Problem Relation Age of Onset  . Hypertension Sister     SOCIAL HISTORY:   Social History   Tobacco Use  . Smoking status: Current Every Day Smoker    Packs/day: 0.50    Years: 47.00    Pack years: 23.50    Types: Cigarettes  . Smokeless tobacco: Never Used  Substance Use Topics  . Alcohol use: Yes    Alcohol/week: 1.0 standard drinks    Types: 1 Glasses of wine per week    Comment:  almost none in last 6 months  . Drug use: No    ALLERGIES:  has No Known Allergies.  MEDICATIONS:  Current Outpatient Medications  Medication Sig Dispense Refill  . amLODipine (NORVASC) 10 MG tablet Take 1 tablet by mouth once daily 30 tablet 4  . aspirin EC 81 MG tablet Take 81 mg by mouth daily.    Marland Kitchen atorvastatin (LIPITOR) 10 MG tablet Take 1 tablet (10 mg total) by mouth daily. 30 tablet 11  . clopidogrel (PLAVIX) 75 MG tablet Take 1 tablet by mouth once daily 90 tablet 0  . dicyclomine (BENTYL) 10 MG capsule Take 10 mg by mouth 4 (four) times daily -  before meals and at bedtime.    . folic acid (FOLVITE) 1 MG tablet Take 1 tablet (1 mg total) by mouth daily. 30 tablet 1  . iron polysaccharides (NU-IRON) 150 MG capsule Take 150 mg by mouth daily.     Marland Kitchen lisinopril (ZESTRIL) 10 MG tablet Take 1 tablet (10 mg total) by mouth daily. 30 tablet 0  . metFORMIN (GLUCOPHAGE) 1000 MG tablet Take 1,000 mg by mouth daily with breakfast.     . metoprolol tartrate (LOPRESSOR) 25 MG tablet Take 1 tablet by mouth twice daily 180 tablet 0  . oxyCODONE-acetaminophen (PERCOCET/ROXICET) 5-325 MG tablet 1 pill every 8-12 hours 90 tablet 0  . sertraline (ZOLOFT) 50 MG tablet Take 1 tablet by mouth daily.    . IMBRUVICA 280 MG TABS TAKE 1 TABLET  BY MOUTH DAILY. (Patient not taking: No sig reported) 28 tablet 6  . sucralfate (CARAFATE) 1 g tablet Take 1 g by mouth 4 (four) times daily.     No current facility-administered medications for this visit.     PHYSICAL EXAMINATION: ECOG PERFORMANCE STATUS: 1 - Symptomatic but completely ambulatory  BP 139/74 (BP Location: Left Arm, Patient Position: Sitting, Cuff Size: Normal)   Pulse (!) 56   Temp (!) 97.1 F (36.2 C) (Tympanic)   Ht 5' 8"  (1.727 m)   Wt 155 lb 12.8 oz (70.7 kg)   BMI 23.69 kg/m   Filed Weights   03/24/19 1015  Weight: 155 lb 12.8 oz (70.7 kg)    Physical Exam  Constitutional: He is oriented to person, place, and time and well-developed, well-nourished, and in no distress.  He is alone.  Appears pale.  HENT:  Head: Normocephalic and atraumatic.  Mouth/Throat: Oropharynx is clear and moist. No oropharyngeal exudate.  Eyes: Pupils are equal, round, and reactive to light.  Neck: Normal range of motion. Neck supple.  Cardiovascular: Normal rate and regular rhythm.  Pulmonary/Chest: No respiratory distress. He has no wheezes.  Decreased air entry bil.   Abdominal: Soft.  Bowel sounds are normal. He exhibits no distension and no mass. There is no abdominal tenderness. There is no rebound and no guarding.  Musculoskeletal: Normal range of motion.        General: No tenderness or edema.  Neurological: He is alert and oriented to person, place, and time.  Skin: Skin is warm. There is pallor.  Multiple bruises noted in bilateral upper extremity.  Psychiatric: Affect normal.      LABORATORY DATA:  I have reviewed the data as listed    Component Value Date/Time   NA 137 03/24/2019 0952   NA 142 05/03/2014 1139   K 5.6 (H) 03/24/2019 0952   K 4.7 05/03/2014 1139   CL 108 03/24/2019 0952   CL 110 (H) 05/03/2014 1139   CO2 22 03/24/2019 0952   CO2 24 05/03/2014 1139   GLUCOSE 119 (H) 03/24/2019 0952   GLUCOSE 98 05/03/2014 1139   BUN 33 (H) 03/24/2019  0952   BUN 20 (H) 05/03/2014 1139   CREATININE 1.31 (H) 03/24/2019 0952   CREATININE 1.22 08/09/2014 1122   CALCIUM 8.8 (L) 03/24/2019 0952   CALCIUM 8.6 05/03/2014 1139   PROT 7.4 02/10/2019 0931   PROT 7.5 05/03/2014 1139   ALBUMIN 4.4 02/10/2019 0931   ALBUMIN 4.1 05/03/2014 1139   AST 12 (L) 02/10/2019 0931   AST 15 05/03/2014 1139   ALT 13 02/10/2019 0931   ALT 26 05/03/2014 1139   ALKPHOS 86 02/10/2019 0931   ALKPHOS 94 05/03/2014 1139   BILITOT 0.4 02/10/2019 0931   BILITOT 0.5 05/03/2014 1139   GFRNONAA 55 (L) 03/24/2019 0952   GFRNONAA >60 08/09/2014 1122   GFRAA >60 03/24/2019 0952   GFRAA >60 08/09/2014 1122    No results found for: SPEP, UPEP  Lab Results  Component Value Date   WBC 16.9 (H) 03/24/2019   NEUTROABS 3.9 03/24/2019   HGB 8.6 (L) 03/24/2019   HCT 28.5 (L) 03/24/2019   MCV 110.0 (H) 03/24/2019   PLT 112 (L) 03/24/2019      Chemistry      Component Value Date/Time   NA 137 03/24/2019 0952   NA 142 05/03/2014 1139   K 5.6 (H) 03/24/2019 0952   K 4.7 05/03/2014 1139   CL 108 03/24/2019 0952   CL 110 (H) 05/03/2014 1139   CO2 22 03/24/2019 0952   CO2 24 05/03/2014 1139   BUN 33 (H) 03/24/2019 0952   BUN 20 (H) 05/03/2014 1139   CREATININE 1.31 (H) 03/24/2019 0952   CREATININE 1.22 08/09/2014 1122      Component Value Date/Time   CALCIUM 8.8 (L) 03/24/2019 0952   CALCIUM 8.6 05/03/2014 1139   ALKPHOS 86 02/10/2019 0931   ALKPHOS 94 05/03/2014 1139   AST 12 (L) 02/10/2019 0931   AST 15 05/03/2014 1139   ALT 13 02/10/2019 0931   ALT 26 05/03/2014 1139   BILITOT 0.4 02/10/2019 0931   BILITOT 0.5 05/03/2014 1139       RADIOGRAPHIC STUDIES: I have personally reviewed the radiological images as listed and agreed with the findings in the report. No results found.   ASSESSMENT & PLAN:  CLL (chronic lymphocytic leukemia) ([place]) # CLL/SLL- relapsed most recently on ibrutinib; July 2020- CT Ab/Pelvis- CT scan shows no  progressive/worsening lymphadenopathy.  Currently held ibrutinib because of progressive anemia [see discussion below].  White count-17/lymphocytosis slightly rising-concerning for recurrence.  #Given the lack of convincing evidence of significant lymphadenopathy-and multiple other medical issues [renal insufficiency/anemia unrelated  to CLL]-I think it is reasonable to hold off ibrutinib monitor closely.  Next option would be Gazyva.  #Anemia-hemoglobin 8.6 question etiology.  Question CKD versus others-copper/folic acid deficiency/CLL.  Will recheck copper/folic acid next visit.  Continue with Aranesp.  #Hyperkalemia potassium 5. 6 today.  Creatinine stable at 1.3.  Continue follow-up with nephrology.  Reviewed appreciate nephrology evaluation.  # Abdominal pain/dyspepsia/unclear etiology; stable.  Percocet for now.  S/p extensive evaluation at [facility].  Continue Percocet at the current dose. Interested in pain clinic referral.  # DISPOSITION: # referral to Pain clinic- re: chronic abdominal pain #  aranesp today; # in 2 weeks- lab- cbc/bmp- Aranesp # in 4 weeks- lab- cbc/bmp- Aranesp # in 6 weeks- MD-cbc/bmp--possible aranesp-Dr.B    Orders Placed This Encounter  Procedures  . CBC with Differential    Standing Status:   Standing    Number of Occurrences:   20    Standing Expiration Date:   03/23/2020  . Basic metabolic panel    Standing Status:   Standing    Number of Occurrences:   20    Standing Expiration Date:   03/23/2020  . Copper, serum    Standing Status:   Future    Standing Expiration Date:   03/23/2020  . Folate    Standing Status:   Future    Standing Expiration Date:   03/23/2020  . Ambulatory referral to Pain Clinic    Referral Priority:   Routine    Referral Type:   Consultation    Referral Reason:   Specialty Services Required    Requested Specialty:   Pain Medicine    Number of Visits Requested:   1   All questions were answered. The patient knows to call the  clinic with any problems, questions or concerns.      Cammie Sickle, MD 03/24/2019 12:49 PM

## 2019-03-29 ENCOUNTER — Telehealth: Payer: Self-pay | Admitting: *Deleted

## 2019-03-29 NOTE — Telephone Encounter (Signed)
James [name], the referral was entered to [facility]. Pain clinic apts are not immediately provided. These have to be reviewed by pain mgmt team doctors first. The dept will call him with the apts.

## 2019-03-29 NOTE — Telephone Encounter (Signed)
Patient called asking if appointment and info has been sent to pain clinic. Please advise

## 2019-03-29 NOTE — Telephone Encounter (Signed)
Patient informed to give it a week and if he has not heard from them by then to call us back

## 2019-04-07 ENCOUNTER — Inpatient Hospital Stay: Payer: Medicare Other | Attending: Internal Medicine

## 2019-04-07 ENCOUNTER — Inpatient Hospital Stay: Payer: Medicare Other

## 2019-04-07 ENCOUNTER — Other Ambulatory Visit: Payer: Self-pay

## 2019-04-07 VITALS — BP 144/75 | HR 55

## 2019-04-07 DIAGNOSIS — D631 Anemia in chronic kidney disease: Secondary | ICD-10-CM | POA: Insufficient documentation

## 2019-04-07 DIAGNOSIS — N183 Chronic kidney disease, stage 3 unspecified: Secondary | ICD-10-CM

## 2019-04-07 DIAGNOSIS — N189 Chronic kidney disease, unspecified: Secondary | ICD-10-CM | POA: Diagnosis present

## 2019-04-07 DIAGNOSIS — N184 Chronic kidney disease, stage 4 (severe): Secondary | ICD-10-CM

## 2019-04-07 DIAGNOSIS — C9110 Chronic lymphocytic leukemia of B-cell type not having achieved remission: Secondary | ICD-10-CM

## 2019-04-07 DIAGNOSIS — E875 Hyperkalemia: Secondary | ICD-10-CM

## 2019-04-07 LAB — CBC WITH DIFFERENTIAL/PLATELET
Abs Immature Granulocytes: 0.04 10*3/uL (ref 0.00–0.07)
Basophils Absolute: 0.2 10*3/uL — ABNORMAL HIGH (ref 0.0–0.1)
Basophils Relative: 1 %
Eosinophils Absolute: 0.2 10*3/uL (ref 0.0–0.5)
Eosinophils Relative: 1 %
HCT: 30.5 % — ABNORMAL LOW (ref 39.0–52.0)
Hemoglobin: 9.2 g/dL — ABNORMAL LOW (ref 13.0–17.0)
Immature Granulocytes: 0 %
Lymphocytes Relative: 70 %
Lymphs Abs: 14.4 10*3/uL — ABNORMAL HIGH (ref 0.7–4.0)
MCH: 33.6 pg (ref 26.0–34.0)
MCHC: 30.2 g/dL (ref 30.0–36.0)
MCV: 111.3 fL — ABNORMAL HIGH (ref 80.0–100.0)
Monocytes Absolute: 2.6 10*3/uL — ABNORMAL HIGH (ref 0.1–1.0)
Monocytes Relative: 13 %
Neutro Abs: 3.1 10*3/uL (ref 1.7–7.7)
Neutrophils Relative %: 15 %
Platelets: 119 10*3/uL — ABNORMAL LOW (ref 150–400)
RBC: 2.74 MIL/uL — ABNORMAL LOW (ref 4.22–5.81)
RDW: 15.9 % — ABNORMAL HIGH (ref 11.5–15.5)
Smear Review: NORMAL
WBC: 20.5 10*3/uL — ABNORMAL HIGH (ref 4.0–10.5)
nRBC: 0 % (ref 0.0–0.2)

## 2019-04-07 LAB — BASIC METABOLIC PANEL
Anion gap: 6 (ref 5–15)
BUN: 22 mg/dL (ref 8–23)
CO2: 23 mmol/L (ref 22–32)
Calcium: 9.1 mg/dL (ref 8.9–10.3)
Chloride: 108 mmol/L (ref 98–111)
Creatinine, Ser: 1.35 mg/dL — ABNORMAL HIGH (ref 0.61–1.24)
GFR calc Af Amer: >60 mL/min (ref >60)
GFR calc non Af Amer: 53 mL/min — ABNORMAL LOW (ref >60)
Glucose, Bld: 110 mg/dL — ABNORMAL HIGH (ref 70–99)
Potassium: 4.8 mmol/L (ref 3.5–5.1)
Sodium: 137 mmol/L (ref 135–145)

## 2019-04-07 MED ORDER — DARBEPOETIN ALFA 300 MCG/0.6ML IJ SOSY
300.0000 ug | PREFILLED_SYRINGE | Freq: Once | INTRAMUSCULAR | Status: AC
  Administered 2019-04-07: 300 ug via SUBCUTANEOUS
  Filled 2019-04-07: qty 0.6

## 2019-04-09 MED FILL — IMBRUVICA 280 MG TAB: 280 | 28 days supply | Qty: 28 | Fill #5

## 2019-04-13 ENCOUNTER — Other Ambulatory Visit: Payer: Self-pay | Admitting: *Deleted

## 2019-04-13 DIAGNOSIS — C9112 Chronic lymphocytic leukemia of B-cell type in relapse: Secondary | ICD-10-CM

## 2019-04-13 DIAGNOSIS — C9192 Lymphoid leukemia, unspecified, in relapse: Secondary | ICD-10-CM

## 2019-04-13 DIAGNOSIS — C8300 Small cell B-cell lymphoma, unspecified site: Secondary | ICD-10-CM

## 2019-04-14 ENCOUNTER — Telehealth: Payer: Self-pay | Admitting: *Deleted

## 2019-04-14 MED ORDER — OXYCODONE-ACETAMINOPHEN 5-325 MG PO TABS: ORAL_TABLET | ORAL | 0 refills | Status: DC

## 2019-04-14 NOTE — Telephone Encounter (Signed)
-----   Message from Verlon Au, NP sent at 04/13/2019  4:08 PM EST ----- [name]- what is status of pain clinic referral? Thanks!  Ander Purpura

## 2019-04-14 NOTE — Telephone Encounter (Signed)
Reviewed referral workque. The referral is currently under Dr. Adalberto Cole review.

## 2019-04-14 NOTE — Telephone Encounter (Signed)
Patient called cancer center requesting refill of Percocet 5-325 mg tablets every 8-12 hours as needed for pain.   As mandated by the [place] STOP Act (Strengthen Opioid Misuse Prevention), the [place] Controlled Substance Reporting System ([place]) was reviewed for this patient. Below is the past 23-months of controlled substance prescriptions as displayed by the registry. I have personally consulted with my supervising physician, Dr. Mike Gip, who agrees that continuation of opiate therapy is medically appropriate at this time and agrees to provide continual monitoring, including urine/blood drug screens, as indicated. Refill is appropriate on or after 04/13/19.  [place] reviewed: Reviewed and ok to refill     Faythe Casa, NP 04/14/2019 12:21 PM [phone]

## 2019-04-20 ENCOUNTER — Other Ambulatory Visit: Payer: Self-pay

## 2019-04-21 ENCOUNTER — Other Ambulatory Visit: Payer: Self-pay

## 2019-04-21 ENCOUNTER — Inpatient Hospital Stay: Payer: Medicare Other

## 2019-04-21 DIAGNOSIS — D631 Anemia in chronic kidney disease: Secondary | ICD-10-CM

## 2019-04-21 DIAGNOSIS — E875 Hyperkalemia: Secondary | ICD-10-CM

## 2019-04-21 DIAGNOSIS — C9110 Chronic lymphocytic leukemia of B-cell type not having achieved remission: Secondary | ICD-10-CM

## 2019-04-21 DIAGNOSIS — N184 Chronic kidney disease, stage 4 (severe): Secondary | ICD-10-CM

## 2019-04-21 DIAGNOSIS — N189 Chronic kidney disease, unspecified: Secondary | ICD-10-CM | POA: Diagnosis not present

## 2019-04-21 LAB — BASIC METABOLIC PANEL
Anion gap: 7 (ref 5–15)
BUN: 21 mg/dL (ref 8–23)
CO2: 23 mmol/L (ref 22–32)
Calcium: 8.9 mg/dL (ref 8.9–10.3)
Chloride: 110 mmol/L (ref 98–111)
Creatinine, Ser: 1.22 mg/dL (ref 0.61–1.24)
GFR calc Af Amer: >60 mL/min (ref >60)
GFR calc non Af Amer: 60 mL/min — ABNORMAL LOW (ref >60)
Glucose, Bld: 110 mg/dL — ABNORMAL HIGH (ref 70–99)
Potassium: 5 mmol/L (ref 3.5–5.1)
Sodium: 140 mmol/L (ref 135–145)

## 2019-04-21 LAB — CBC WITH DIFFERENTIAL/PLATELET
Abs Immature Granulocytes: 0 10*3/uL (ref 0.00–0.07)
Basophils Absolute: 0.5 10*3/uL — ABNORMAL HIGH (ref 0.0–0.1)
Basophils Relative: 3 %
Eosinophils Absolute: 0.2 10*3/uL (ref 0.0–0.5)
Eosinophils Relative: 1 %
HCT: 30.6 % — ABNORMAL LOW (ref 39.0–52.0)
Hemoglobin: 9.1 g/dL — ABNORMAL LOW (ref 13.0–17.0)
Lymphocytes Relative: 66 %
Lymphs Abs: 11.7 10*3/uL — ABNORMAL HIGH (ref 0.7–4.0)
MCH: 32.7 pg (ref 26.0–34.0)
MCHC: 29.7 g/dL — ABNORMAL LOW (ref 30.0–36.0)
MCV: 110.1 fL — ABNORMAL HIGH (ref 80.0–100.0)
Monocytes Absolute: 1.2 10*3/uL — ABNORMAL HIGH (ref 0.1–1.0)
Monocytes Relative: 7 %
Neutro Abs: 4.1 10*3/uL (ref 1.7–7.7)
Neutrophils Relative %: 23 %
Platelets: 107 10*3/uL — ABNORMAL LOW (ref 150–400)
RBC: 2.78 MIL/uL — ABNORMAL LOW (ref 4.22–5.81)
RDW: 15.7 % — ABNORMAL HIGH (ref 11.5–15.5)
Smear Review: NORMAL
WBC: 17.8 10*3/uL — ABNORMAL HIGH (ref 4.0–10.5)
nRBC: 0 % (ref 0.0–0.2)

## 2019-04-21 MED ORDER — DARBEPOETIN ALFA 300 MCG/0.6ML IJ SOSY
300.0000 ug | PREFILLED_SYRINGE | Freq: Once | INTRAMUSCULAR | Status: AC
  Administered 2019-04-21: 300 ug via SUBCUTANEOUS
  Filled 2019-04-21: qty 0.6

## 2019-04-27 ENCOUNTER — Telehealth: Payer: Self-pay | Admitting: *Deleted

## 2019-04-27 NOTE — Telephone Encounter (Signed)
Attempted to call for pre appointment assessment. Messages left on both numbers listed.

## 2019-04-28 ENCOUNTER — Encounter: Payer: Self-pay | Admitting: Pain Medicine

## 2019-04-28 ENCOUNTER — Telehealth: Payer: Self-pay | Admitting: Pain Medicine

## 2019-04-28 NOTE — Telephone Encounter (Signed)
Called and got history info for 05/03/19 appt.

## 2019-04-28 NOTE — Telephone Encounter (Signed)
Patient returning call for New Patient appt Monday w/ Dr. Dossie Arbour. Asks that you use home phone [phone]

## 2019-05-02 NOTE — Progress Notes (Signed)
Patient's Name: James [name]  MRN: [id number]  Referring Provider: Cammie Sickle, *  DOB: May 25, 1950  PCP: [name], MD  DOS: 05/03/2019  Note by: Gaspar Cola, MD  Service setting: Ambulatory outpatient  Specialty: Interventional Pain Management  Location: [facility] Virtual Visit  Visit type: Initial Patient Evaluation  Patient type: New Patient   Virtual Encounter - Pain Management PROVIDER NOTE: Information contained herein reflects review and annotations entered in association with encounter. Interpretation of such information and data should be left to medically-trained personnel. Information provided to patient can be located elsewhere in the medical record under "Patient Instructions". Document created using STT-dictation technology, any transcriptional errors that may result from process are unintentional.    Contact & Pharmacy Preferred: [phone] Home: [phone] (home) Mobile: [phone] (mobile) E-mail: No e-mail address on record  [place] [address] [place] [address] [place] [address] Phone: [phone] Fax: [place], [place] [address] Phone: [phone] Fax: [phone]   Pre-screening note:  Our staff contacted James [name] and offered him an "in person", "face-to-face" appointment versus a telephone encounter. He indicated preferring the telephone encounter, at this time.  Primary Reason(s) for Visit: Tele-Encounter for initial evaluation of one or more chronic problems (new to examiner) potentially causing chronic pain, and posing a threat to normal musculoskeletal function. (Level of risk: High) CC: Abdominal Pain (upper)  I contacted James [name] on 05/03/2019 via telephone.      I clearly identified myself as Gaspar Cola, MD. I verified that I was  speaking with the correct person using two identifiers (Name: James [name], and date of birth: Dec 04, 1950).  Advanced Informed Consent I sought verbal advanced consent from James [name] for virtual visit interactions. I informed James [name] of possible security and privacy concerns, risks, and limitations associated with providing "not-in-person" medical evaluation and management services. I also informed James [name] of the availability of "in-person" appointments. Finally, I informed him that there would be a charge for the virtual visit and that he could be  personally, fully or partially, financially responsible for it. James [name] expressed understanding and agreed to proceed.   HPI  James [name] is a 69 y.o. year old, male patient, contacted today for an initial evaluation of his chronic pain. He has CLL (chronic lymphocytic leukemia) ([place]); Generalized abdominal pain; Malignant lymphoma, small lymphocytic ([place]); Hyperlipidemia; Diabetes mellitus type 2, uncomplicated ([place]); Hypertension; Atherosclerotic peripheral vascular disease with intermittent claudication ([place]); Toe cyanosis; Atherosclerosis of native arteries of extremity with rest pain ([place]); Anemia due to stage 3 chronic kidney disease; CKD stage 3 secondary to diabetes ([place]); Atherosclerosis of native arteries of the extremities with ulceration ([place]); Shortness of breath; Acute on chronic renal failure (HCC); SOB (shortness of breath); Anemia; AVM (arteriovenous malformation) of duodenum, acquired with hemorrhage; Chronic epigastric pain (Primary Area of Pain); Current smoker; Gastroesophageal reflux disease without esophagitis; Hx of adenomatous colonic polyps; Portal hypertensive gastropathy ([place]); Thrombocytopenia ([place]); Major depressive disorder, recurrent, mild ([place]); Stage 3 chronic kidney disease; Chronic pain syndrome; Pharmacologic therapy; Disorder of skeletal system; Problems influencing health status; Chronic abdominal pain; Elevated  uric acid in blood; Chronic prescription opiate use; Enlarged prostate; and Folic acid deficiency on their problem list.  Pain Assessment: Location: Upper Abdomen Radiating: denies Onset: More than a month ago Duration: Chronic pain Quality: Aching,  Sharp Severity:  /10 (subjective, self-reported pain score)  Effect on ADL: Makes me very tired and dreadful Timing: Constant Modifying factors: medication  Onset and Duration: Present longer than 3 months Cause of pain: Cancer Severity: NAS-11 at its worse: 10/10, NAS-11 at its best: 3/10, NAS-11 now: 7/10 and NAS-11 on the average: 7/10 Timing: Morning Aggravating Factors: Hurts all the time. Alleviating Factors: Lying down and Medications Associated Problems: Pain that does not allow patient to sleep Quality of Pain: Aching, Nagging, Sharp and Uncomfortable Previous Examinations or Tests: MRI scan and X-rays Previous Treatments: Narcotic medications  According to the patient he has been experiencing chronic epigastric abdominal pain for the past 5 years.  This pain has been worsening with time.  He indicates having had diagnostic endoscopies, CT scans, ultrasounds, none of which have provided any useful information as to what is going on.  He does have a history significant for chronic lymphocytic leukemia.  He denies any prior nerve blocks or ever having been seen at another pain clinic.  He has been treated by gastroenterologists such as Dr. Ferne [name].  The pain does not seem to change with intake of meals, liquids, or with bowel movements or voiding.  He denies ever having tried pregabalin (Lyrica) or gabapentin (Neurontin).  He had a complete abdominal ultrasound in 2016, but nothing since.  This chronic abdominal pain has caused him to lose over 50 pounds over the last couple years and the only thing that he seems to get some benefit from his when he takes oxycodone/APAP 5/325 1 tablet p.o. 3 times daily, which he has been doing  nonstop for the past 2 years.  Because of this, today I took the opportunity to talk to him about opioid tolerance and the concept of "Drug Holidays".  He does admit to prolonged bleeding and according to my review of the his lab work, he does have thrombocytopenia, elevated uric acid levels, but no recent sed rate, CRP, amylase or lipase levels, folate, magnesium, or vitamin D levels.  He does have diabetes and chronic kidney disease.  He refers that he currently is 5 feet 8 inches tall and weighs about 155 pounds.  The patient was informed that my practice is divided into two sections: an interventional pain management section, as well as a completely separate and distinct medication management section. I explained that I have procedure days for my interventional therapies, and evaluation days for follow-ups and medication management. Because of the amount of documentation required during both, they are kept separated. This means that there is the possibility that he may be scheduled for a procedure on one day, and medication management the next. I have also informed him that because of staffing and facility limitations, I no longer take patients for medication management only. To illustrate the reasons for this, I gave the patient the example of surgeons, and how inappropriate it would be to refer a patient to his/her care, just to write for the post-surgical antibiotics on a surgery done by a different surgeon.   Because interventional pain management is my board-certified specialty, the patient was informed that joining my practice means that they are open to any and all interventional therapies. I made it clear that this does not mean that they will be forced to have any procedures done. What this means is that I believe interventional therapies to be essential part of the diagnosis and proper management of chronic pain conditions. Therefore, patients not interested in these interventional alternatives  will be better served under the care of a different practitioner.  The patient was also made aware of my Comprehensive Pain Management Safety Guidelines where by joining my practice, they limit all of their nerve blocks and joint injections to those done by our practice, for as long as we are retained to manage their care.   Historic Controlled Substance Pharmacotherapy Review  PMP and historical list of controlled substances: Oxycodone/APAP 5/325, 1 tab PO q 8 hrs (15 mg/day of oxycodone IR) Highest opioid analgesic regimen found: Oxycodone/APAP 5/325, 1 tab PO q 8 hrs (15 mg/day of oxycodone IR)  Most recent opioid analgesic: Oxycodone/APAP 5/325, 1 tab PO q 8 hrs (15 mg/day of oxycodone IR) Current opioid analgesics: Oxycodone/APAP 5/325, 1 tab PO q 8 hrs (15 mg/day of oxycodone IR)  Highest recorded MME/day: 22.5 mg/day MME/day: 22.5 mg/day  Medications: Bottles not available for inspection. Pharmacodynamics: Desired effects: Analgesia: The patient reports >50% benefit. Reported improvement in function: The patient reports medication allows him to accomplish basic ADLs. Clinically meaningful improvement in function (CMIF): Sustained CMIF goals met Perceived effectiveness: Described as relatively effective, allowing for increase in activities of daily living (ADL) Undesirable effects: Side-effects or Adverse reactions: None reported Historical Monitoring: The patient  reports no history of drug use. List of all UDS Test(s): Lab Results  Component Value Date   MDMA NONE DETECTED 05/28/2018   MDMA NONE DETECTED 02/04/2018   COCAINSCRNUR NONE DETECTED 05/28/2018   COCAINSCRNUR NONE DETECTED 02/04/2018   PCPSCRNUR NONE DETECTED 05/28/2018   PCPSCRNUR NONE DETECTED 02/04/2018   THCU NONE DETECTED 05/28/2018   THCU NONE DETECTED 02/04/2018   List of other Serum/Urine Drug Screening Test(s):  Lab Results  Component Value Date   COCAINSCRNUR NONE DETECTED 05/28/2018   COCAINSCRNUR  NONE DETECTED 02/04/2018   THCU NONE DETECTED 05/28/2018   THCU NONE DETECTED 02/04/2018   Historical Background Evaluation: [place] PMP: PDMP reviewed during this encounter. Two (2) year initial data search conducted.             PMP NARX Score Report:  Narcotic: 491 Sedative: 170 Stimulant: 000 [place] Department of public safety, offender search: Editor, commissioning Information) Non-contributory Risk Assessment Profile: Aberrant behavior: None observed or detected today Risk factors for fatal opioid overdose: None identified today PMP NARX Overdose Risk Score: 240 Fatal overdose hazard ratio (HR): Calculation deferred Non-fatal overdose hazard ratio (HR): Calculation deferred Risk of opioid abuse or dependence: 0.7-3.0% with doses ? 36 MME/day and 6.1-26% with doses ? 120 MME/day. Substance use disorder (SUD) risk level: See below Personal History of Substance Abuse (SUD-Substance use disorder):  Alcohol: Negative  Illegal Drugs: Negative  Rx Drugs: Negative  ORT Risk Level calculation: Low Risk Opioid Risk Tool - 05/03/19 1238      Family History of Substance Abuse   Alcohol  Negative    Illegal Drugs  Negative    Rx Drugs  Negative      Personal History of Substance Abuse   Alcohol  Negative    Illegal Drugs  Negative    Rx Drugs  Negative      Age   Age between 71-45 years   No      History of Preadolescent Sexual Abuse   History of Preadolescent Sexual Abuse  Negative or Male      Psychological Disease   Psychological Disease  Negative    Depression  Negative      Total Score   Opioid Risk Tool Scoring  0  Opioid Risk Interpretation  Low Risk      ORT Scoring interpretation table:  Score <3 = Low Risk for SUD  Score between 4-7 = Moderate Risk for SUD  Score >8 = High Risk for Opioid Abuse   Pharmacologic Plan: As per protocol, I have not taken over any controlled substance management, pending the results of ordered tests and/or consults.            Initial impression:  Pending review of available data and ordered tests.  Meds   Current Outpatient Medications:  .  aspirin EC 81 MG tablet, Take 81 mg by mouth daily., Disp: , Rfl:  .  atorvastatin (LIPITOR) 10 MG tablet, Take 1 tablet (10 mg total) by mouth daily., Disp: 30 tablet, Rfl: 11 .  Ensure (ENSURE), Take 237 mLs by mouth., Disp: , Rfl:  .  folic acid (FOLVITE) 1 MG tablet, Take 1 tablet (1 mg total) by mouth daily., Disp: 30 tablet, Rfl: 1 .  iron polysaccharides (NU-IRON) 150 MG capsule, Take 150 mg by mouth daily. , Disp: , Rfl:  .  lisinopril (ZESTRIL) 10 MG tablet, Take 1 tablet (10 mg total) by mouth daily., Disp: 30 tablet, Rfl: 0 .  metFORMIN (GLUCOPHAGE) 1000 MG tablet, Take 1,000 mg by mouth daily with breakfast. , Disp: , Rfl:  .  oxyCODONE-acetaminophen (PERCOCET/ROXICET) 5-325 MG tablet, 1 pill every 8-12 hours, Disp: 90 tablet, Rfl: 0 .  sucralfate (CARAFATE) 1 g tablet, Take 1 g by mouth 4 (four) times daily., Disp: , Rfl:   ROS  Cardiovascular: No reported cardiovascular signs or symptoms such as High blood pressure, coronary artery disease, abnormal heart rate or rhythm, heart attack, blood thinner therapy or heart weakness and/or failure Pulmonary or Respiratory: No reported pulmonary signs or symptoms such as wheezing and difficulty taking a deep full breath (Asthma), difficulty blowing air out (Emphysema), coughing up mucus (Bronchitis), persistent dry cough, or temporary stoppage of breathing during sleep Neurological: No reported neurological signs or symptoms such as seizures, abnormal skin sensations, urinary and/or fecal incontinence, being born with an abnormal open spine and/or a tethered spinal cord Review of Past Neurological Studies: No results found for this or any previous visit. Psychological-Psychiatric: Depressed and Difficulty sleeping and or falling asleep Gastrointestinal: No reported gastrointestinal signs or symptoms such as vomiting or evacuating blood, reflux,  heartburn, alternating episodes of diarrhea and constipation, inflamed or scarred liver, or pancreas or irrregular and/or infrequent bowel movements Genitourinary: No reported renal or genitourinary signs or symptoms such as difficulty voiding or producing urine, peeing blood, non-functioning kidney, kidney stones, difficulty emptying the bladder, difficulty controlling the flow of urine, or chronic kidney disease Hematological: No reported hematological signs or symptoms such as prolonged bleeding, low or poor functioning platelets, bruising or bleeding easily, hereditary bleeding problems, low energy levels due to low hemoglobin or being anemic Endocrine: High blood sugar controlled without the use of insulin (NIDDM) Rheumatologic: No reported rheumatological signs and symptoms such as fatigue, joint pain, tenderness, swelling, redness, heat, stiffness, decreased range of motion, with or without associated rash Musculoskeletal: Negative for myasthenia gravis, muscular dystrophy, multiple sclerosis or malignant hyperthermia Work History: Retired  Allergies  Mr. Ploeger has No Known Allergies.  Laboratory Chemistry Profile   Screening Lab Results  Component Value Date   SARSCOV2NAA NEGATIVE 12/28/2018   HIV Non Reactive 12/29/2018    Inflammation (CRP: Acute Phase) (ESR: Chronic Phase) Lab Results  Component Value Date   CRP <0.8 07/14/2017  LATICACIDVEN 1.3 12/28/2018                         Rheumatology Lab Results  Component Value Date   LABURIC 9.0 (H) 11/14/2014                        Renal Lab Results  Component Value Date   BUN 21 04/21/2019   CREATININE 1.22 04/21/2019   GFRAA >60 04/21/2019   GFRNONAA 60 (L) 04/21/2019                             Hepatic Lab Results  Component Value Date   AST 12 (L) 02/10/2019   ALT 13 02/10/2019   ALBUMIN 4.4 02/10/2019   ALKPHOS 86 02/10/2019   AMYLASE 39 08/16/2016   LIPASE 41 08/16/2016                         Electrolytes Lab Results  Component Value Date   NA 140 04/21/2019   K 5.0 04/21/2019   CL 110 04/21/2019   CALCIUM 8.9 04/21/2019   MG 1.9 02/06/2015   PHOS 3.3 02/06/2015                        Neuropathy Lab Results  Component Value Date   VITAMINB12 453 12/30/2018   FOLATE 3.1 (L) 12/30/2018   HGBA1C 5.0 12/28/2018   HIV Non Reactive 12/29/2018                        CNS No results found.  Bone No results found.  Coagulation Lab Results  Component Value Date   INR 1.04 02/09/2018   LABPROT 13.5 02/09/2018   APTT 30 02/09/2018   PLT 107 (L) 04/21/2019                        Cardiovascular Lab Results  Component Value Date   TROPONINI < 0.02 11/16/2013   HGB 9.1 (L) 04/21/2019   HCT 30.6 (L) 04/21/2019                         ID Lab Results  Component Value Date   HIV Non Reactive 12/29/2018   [place] NEGATIVE 12/28/2018   MICROTEXT  11/19/2013       COMMENT                   NO SALMONELLA OR SHIGELLA ISOLATED   COMMENT                   NO PATHOGENIC E.COLI DETECTED   COMMENT                   NO CAMPYLOBACTER ANTIGEN DETECTED   ANTIBIOTIC                                                        Cancer No results found.  Endocrine No results found.  Note: Lab results reviewed.  Imaging Review   Complexity Note: No results found under the [facility] electronic medical record.  [place]  Drug: James [name]  reports no history of drug use. Alcohol:  reports current alcohol use of about 1.0 standard drinks of alcohol per week. Tobacco:  reports that he has been smoking cigarettes. He has a 23.50 pack-year smoking history. He has never used smokeless tobacco. Medical:  has a past medical history of CLL (chronic lymphocytic leukemia) ([place]), Depression, Diabetes mellitus without complication ([place]), Hematuria, Hyperlipidemia, and Hypertension. Family: family history includes Hypertension in his sister.  Past Surgical  History:  Procedure Laterality Date  . CATARACT EXTRACTION W/ INTRAOCULAR LENS IMPLANT Bilateral   . CHOLECYSTECTOMY  1983  . COLONOSCOPY WITH PROPOFOL N/A 10/27/2017   Procedure: COLONOSCOPY WITH PROPOFOL;  Surgeon: Manya Silvas, MD;  Location: [facility] ENDOSCOPY;  Service: Endoscopy;  Laterality: N/A;  . ESOPHAGOGASTRODUODENOSCOPY (EGD) WITH PROPOFOL N/A 11/20/2016   Procedure: ESOPHAGOGASTRODUODENOSCOPY (EGD) WITH PROPOFOL;  Surgeon: Manya Silvas, MD;  Location: [facility] ENDOSCOPY;  Service: Endoscopy;  Laterality: N/A;  . ESOPHAGOGASTRODUODENOSCOPY (EGD) WITH PROPOFOL N/A 10/27/2017   Procedure: ESOPHAGOGASTRODUODENOSCOPY (EGD) WITH PROPOFOL;  Surgeon: Manya Silvas, MD;  Location: [facility] ENDOSCOPY;  Service: Endoscopy;  Laterality: N/A;  . LOWER EXTREMITY ANGIOGRAPHY Right 05/19/2017   Procedure: LOWER EXTREMITY ANGIOGRAPHY;  Surgeon: Algernon Huxley, MD;  Location: [place] CV LAB;  Service: Cardiovascular;  Laterality: Right;  . LOWER EXTREMITY ANGIOGRAPHY Left 08/10/2018   Procedure: LOWER EXTREMITY ANGIOGRAPHY;  Surgeon: Algernon Huxley, MD;  Location: [place] CV LAB;  Service: Cardiovascular;  Laterality: Left;   Active Ambulatory Problems    Diagnosis Date Noted  . CLL (chronic lymphocytic leukemia) ([place]) 12/26/2013  . Generalized abdominal pain 12/15/2015  . Malignant lymphoma, small lymphocytic ([place]) 03/25/2016  . Hyperlipidemia 05/06/2017  . Diabetes mellitus type 2, uncomplicated ([place]) 85/46/2703  . Hypertension 12/26/2013  . Atherosclerotic peripheral vascular disease with intermittent claudication ([place]) 05/06/2017  . Toe cyanosis 05/06/2017  . Atherosclerosis of native arteries of extremity with rest pain ([place]) 05/13/2017  . Anemia due to stage 3 chronic kidney disease 04/15/2018  . CKD stage 3 secondary to diabetes ([place]) 08/04/2018  . Atherosclerosis of native arteries of the extremities with ulceration ([place]) 08/04/2018  . Shortness of breath 12/28/2018  . Acute on  chronic renal failure ([place]) 12/28/2018  . SOB (shortness of breath) 12/28/2018  . Anemia 10/16/2016  . AVM (arteriovenous malformation) of duodenum, acquired with hemorrhage 12/12/2016  . Chronic epigastric pain (Primary Area of Pain) 08/21/2017  . Current smoker 10/16/2016  . Gastroesophageal reflux disease without esophagitis 08/22/2017  . Hx of adenomatous colonic polyps 08/21/2017  . Portal hypertensive gastropathy ([place]) 09/25/2018  . Thrombocytopenia ([place]) 12/12/2016  . Major depressive disorder, recurrent, mild ([place]) 03/08/2019  . Stage 3 chronic kidney disease 08/04/2018  . Chronic pain syndrome 05/03/2019  . Pharmacologic therapy 05/03/2019  . Disorder of skeletal system 05/03/2019  . Problems influencing health status 05/03/2019  . Chronic abdominal pain 05/03/2019  . Elevated uric acid in blood 05/03/2019  . Chronic prescription opiate use 05/03/2019  . Enlarged prostate 05/03/2019  . Folic acid deficiency 50/12/3816   Resolved Ambulatory Problems    Diagnosis Date Noted  . No Resolved Ambulatory Problems   Past Medical History:  Diagnosis Date  . Depression   . Diabetes mellitus without complication ([place])   . Hematuria    Assessment  Primary Diagnosis & Pertinent Problem List: The primary encounter diagnosis was Chronic pain syndrome. Diagnoses of Chronic epigastric pain (Primary Area of Pain), Chronic abdominal pain, Generalized abdominal pain, CLL (  chronic lymphocytic leukemia) ([place]), Malignant lymphoma, small lymphocytic ([place]), Pharmacologic therapy, Chronic prescription opiate use, Disorder of skeletal system, Problems influencing health status, Thrombocytopenia ([place]), Elevated uric acid in blood, Anemia due to stage 3 chronic kidney disease, unspecified whether stage 3a or 3b CKD, Gastroesophageal reflux disease without esophagitis, Type 2 diabetes mellitus without complication, without long-term current use of insulin ([place]), Enlarged prostate, and Folic acid  deficiency were also pertinent to this visit.  Visit Diagnosis (New problems to examiner): 1. Chronic pain syndrome   2. Chronic epigastric pain (Primary Area of Pain)   3. Chronic abdominal pain   4. Generalized abdominal pain   5. CLL (chronic lymphocytic leukemia) ([place])   6. Malignant lymphoma, small lymphocytic ([place])   7. Pharmacologic therapy   8. Chronic prescription opiate use   9. Disorder of skeletal system   10. Problems influencing health status   11. Thrombocytopenia ([place])   12. Elevated uric acid in blood   13. Anemia due to stage 3 chronic kidney disease, unspecified whether stage 3a or 3b CKD   14. Gastroesophageal reflux disease without esophagitis   15. Type 2 diabetes mellitus without complication, without long-term current use of insulin ([place])   16. Enlarged prostate   17. Folic acid deficiency    Plan of Care (Initial workup plan)  Note: James [name] was reminded that as per protocol, today's visit has been an evaluation only. We have not taken over the patient's controlled substance management.  Problem-specific plan: No problem-specific Assessment & Plan notes found for this encounter.   Lab Orders     UDS (Comprehensive-24) ([facility]) ([facility]) (New Pt.)     Comprehensive Metabolic Panel w/GFR     Magnesium     Vitamin B12     Sedimentation rate     Vitamin D (D2+D3)     CRP     Uric acid, random urine     Uric acid, blood     Platelet Count     Platelet Function Test     Hemoglobin A1C     PSA     Folate     Amylase     Lipase  Imaging Orders     US Abdomen Complete Referral Orders  No referral(s) requested today   Procedure Orders    No procedure(s) ordered today   Pharmacotherapy (current): Medications ordered:  No orders of the defined types were placed in this encounter.  Medications administered during this visit: Linsey F. Troost had no medications administered during this visit.   Pharmacological management options:  Opioid  Analgesics: The patient was informed that there is no guarantee that he would be a candidate for opioid analgesics. The decision will be made following CDC guidelines. This decision will be based on the results of diagnostic studies, as well as James [name] risk profile.   Membrane stabilizer: To be determined at a later time  Muscle relaxant: To be determined at a later time  NSAID: To be determined at a later time  Other analgesic(s): To be determined at a later time   Interventional management options: James [name] was informed that there is no guarantee that he would be a candidate for interventional therapies. The decision will be based on the results of diagnostic studies, as well as James [name] risk profile.  Procedure(s) under consideration:  Diagnostic bilateral celiac plexus block #1 under fluoroscopic guidance and IV sedation   Provider-requested follow-up: Return for (VV), E/M, (s/p Tests).  Future Appointments  Date Time  Provider [place]  05/05/2019  9:30 AM [facility] LAB [facility] None  05/05/2019 10:00 AM Cammie Sickle, MD [facility] None  05/05/2019 10:30 AM [facility] INJECTION [facility] None  06/08/2019  2:30 PM [facility] VASC 2 [facility] None  06/08/2019  3:45 PM Dew, Erskine Squibb, MD [facility] None  07/27/2019  2:00 PM [facility] VASC 1 [facility] None  07/27/2019  3:00 PM Dew, Erskine Squibb, MD [facility] None    Total duration of non-face-to-face encounter: 35 minutes.  Primary Care Physician: [name], MD Location: [facility] Note by: Gaspar Cola, MD Date: 05/03/2019; Time: 3:07 PM  Note: This dictation was prepared with Dragon dictation. Any transcriptional errors that may result from this process are unintentional.

## 2019-05-03 ENCOUNTER — Other Ambulatory Visit: Payer: Self-pay

## 2019-05-03 ENCOUNTER — Ambulatory Visit: Payer: Medicare Other | Attending: Pain Medicine | Admitting: Pain Medicine

## 2019-05-03 VITALS — Ht 68.0 in | Wt 155.0 lb

## 2019-05-03 DIAGNOSIS — R1013 Epigastric pain: Secondary | ICD-10-CM | POA: Diagnosis not present

## 2019-05-03 DIAGNOSIS — C8300 Small cell B-cell lymphoma, unspecified site: Secondary | ICD-10-CM

## 2019-05-03 DIAGNOSIS — D696 Thrombocytopenia, unspecified: Secondary | ICD-10-CM

## 2019-05-03 DIAGNOSIS — C9110 Chronic lymphocytic leukemia of B-cell type not having achieved remission: Secondary | ICD-10-CM | POA: Diagnosis not present

## 2019-05-03 DIAGNOSIS — R109 Unspecified abdominal pain: Secondary | ICD-10-CM

## 2019-05-03 DIAGNOSIS — G8929 Other chronic pain: Secondary | ICD-10-CM

## 2019-05-03 DIAGNOSIS — R1084 Generalized abdominal pain: Secondary | ICD-10-CM | POA: Diagnosis not present

## 2019-05-03 DIAGNOSIS — N183 Chronic kidney disease, stage 3 unspecified: Secondary | ICD-10-CM

## 2019-05-03 DIAGNOSIS — E790 Hyperuricemia without signs of inflammatory arthritis and tophaceous disease: Secondary | ICD-10-CM | POA: Insufficient documentation

## 2019-05-03 DIAGNOSIS — K219 Gastro-esophageal reflux disease without esophagitis: Secondary | ICD-10-CM

## 2019-05-03 DIAGNOSIS — E119 Type 2 diabetes mellitus without complications: Secondary | ICD-10-CM

## 2019-05-03 DIAGNOSIS — Z789 Other specified health status: Secondary | ICD-10-CM

## 2019-05-03 DIAGNOSIS — Z79899 Other long term (current) drug therapy: Secondary | ICD-10-CM

## 2019-05-03 DIAGNOSIS — E538 Deficiency of other specified B group vitamins: Secondary | ICD-10-CM

## 2019-05-03 DIAGNOSIS — D631 Anemia in chronic kidney disease: Secondary | ICD-10-CM

## 2019-05-03 DIAGNOSIS — M899 Disorder of bone, unspecified: Secondary | ICD-10-CM

## 2019-05-03 DIAGNOSIS — Z79891 Long term (current) use of opiate analgesic: Secondary | ICD-10-CM

## 2019-05-03 DIAGNOSIS — N400 Enlarged prostate without lower urinary tract symptoms: Secondary | ICD-10-CM

## 2019-05-03 DIAGNOSIS — G894 Chronic pain syndrome: Secondary | ICD-10-CM

## 2019-05-03 NOTE — Patient Instructions (Signed)
____________________________________________________________________________________________  Drug Holidays (Slow)  What is a "Drug Holiday"? Drug Holiday: is the name given to the period of time during which a patient stops taking a medication(s) for the purpose of eliminating tolerance to the drug.  Benefits . Improved effectiveness of opioids. . Decreased opioid dose needed to achieve benefits. . Improved pain with lesser dose.  What is tolerance? Tolerance: is the progressive decreased in effectiveness of a drug due to its repetitive use. With repetitive use, the body gets use to the medication and as a consequence, it loses its effectiveness. This is a common problem seen with opioid pain medications. As a result, a larger dose of the drug is needed to achieve the same effect that used to be obtained with a smaller dose.  How long should a "Drug Holiday" last? You should stay off of the pain medicine for at least 14 consecutive days. (2 weeks)  Should I stop the medicine "cold turkey"? No. You should always coordinate with your Pain Specialist so that he/she can provide you with the correct medication dose to make the transition as smoothly as possible.  How do I stop the medicine? Slowly. You will be instructed to decrease the daily amount of pills that you take by one (1) pill every seven (7) days. This is called a "slow downward taper" of your dose. For example: if you normally take four (4) pills per day, you will be asked to drop this dose to three (3) pills per day for seven (7) days, then to two (2) pills per day for seven (7) days, then to one (1) per day for seven (7) days, and at the end of those last seven (7) days, this is when the "Drug Holiday" would start.   Will I have withdrawals? By doing a "slow downward taper" like this one, it is unlikely that you will experience any significant withdrawal symptoms. Typically, what triggers withdrawals is the sudden stop of a high  dose opioid therapy. Withdrawals can usually be avoided by slowly decreasing the dose over a prolonged period of time.  What are withdrawals? Withdrawals: refers to the wide range of symptoms that occur after stopping or dramatically reducing opiate drugs after heavy and prolonged use. Withdrawal symptoms do not occur to patients that use low dose opioids, or those who take the medication sporadically. Contrary to benzodiazepine (example: Valium, Xanax, etc.) or alcohol withdrawals ("Delirium Tremens"), opioid withdrawals are not lethal. Withdrawals are the physical manifestation of the body getting rid of the excess receptors.  Expected Symptoms Early symptoms of withdrawal may include: . Agitation . Anxiety . Muscle aches . Increased tearing . Insomnia . Runny nose . Sweating . Yawning  Late symptoms of withdrawal may include: . Abdominal cramping . Diarrhea . Dilated pupils . Goose bumps . Nausea . Vomiting  Will I experience withdrawals? Due to the slow nature of the taper, it is very unlikely that you will experience any.  What is a slow taper? Taper: refers to the gradual decrease in dose.  ___________________________________________________________________________________________     

## 2019-05-04 NOTE — Progress Notes (Signed)
Patient is coming in for appointment he is doing well no to all covid questions and mentions some abdominal discomfort

## 2019-05-05 ENCOUNTER — Inpatient Hospital Stay: Payer: Medicare Other | Attending: Internal Medicine

## 2019-05-05 ENCOUNTER — Inpatient Hospital Stay: Payer: Medicare Other

## 2019-05-05 ENCOUNTER — Encounter: Payer: Self-pay | Admitting: Internal Medicine

## 2019-05-05 ENCOUNTER — Inpatient Hospital Stay (HOSPITAL_BASED_OUTPATIENT_CLINIC_OR_DEPARTMENT_OTHER): Payer: Medicare Other | Admitting: Internal Medicine

## 2019-05-05 ENCOUNTER — Other Ambulatory Visit: Payer: Self-pay

## 2019-05-05 DIAGNOSIS — C9110 Chronic lymphocytic leukemia of B-cell type not having achieved remission: Secondary | ICD-10-CM | POA: Diagnosis not present

## 2019-05-05 DIAGNOSIS — N183 Chronic kidney disease, stage 3 unspecified: Secondary | ICD-10-CM | POA: Diagnosis present

## 2019-05-05 DIAGNOSIS — D631 Anemia in chronic kidney disease: Secondary | ICD-10-CM | POA: Insufficient documentation

## 2019-05-05 DIAGNOSIS — D539 Nutritional anemia, unspecified: Secondary | ICD-10-CM

## 2019-05-05 LAB — CBC WITH DIFFERENTIAL/PLATELET
Abs Immature Granulocytes: 0.04 10*3/uL (ref 0.00–0.07)
Basophils Absolute: 0.2 10*3/uL — ABNORMAL HIGH (ref 0.0–0.1)
Basophils Relative: 1 %
Eosinophils Absolute: 0.2 10*3/uL (ref 0.0–0.5)
Eosinophils Relative: 1 %
HCT: 28.6 % — ABNORMAL LOW (ref 39.0–52.0)
Hemoglobin: 8.7 g/dL — ABNORMAL LOW (ref 13.0–17.0)
Immature Granulocytes: 0 %
Lymphocytes Relative: 65 %
Lymphs Abs: 12.2 10*3/uL — ABNORMAL HIGH (ref 0.7–4.0)
MCH: 33.5 pg (ref 26.0–34.0)
MCHC: 30.4 g/dL (ref 30.0–36.0)
MCV: 110 fL — ABNORMAL HIGH (ref 80.0–100.0)
Monocytes Absolute: 2.9 10*3/uL — ABNORMAL HIGH (ref 0.1–1.0)
Monocytes Relative: 15 %
Neutro Abs: 3.4 10*3/uL (ref 1.7–7.7)
Neutrophils Relative %: 18 %
Platelets: 111 10*3/uL — ABNORMAL LOW (ref 150–400)
RBC: 2.6 MIL/uL — ABNORMAL LOW (ref 4.22–5.81)
RDW: 15.5 % (ref 11.5–15.5)
Smear Review: NORMAL
WBC: 19 10*3/uL — ABNORMAL HIGH (ref 4.0–10.5)
nRBC: 0 % (ref 0.0–0.2)

## 2019-05-05 LAB — BASIC METABOLIC PANEL
Anion gap: 7 (ref 5–15)
BUN: 23 mg/dL (ref 8–23)
CO2: 22 mmol/L (ref 22–32)
Calcium: 8.5 mg/dL — ABNORMAL LOW (ref 8.9–10.3)
Chloride: 109 mmol/L (ref 98–111)
Creatinine, Ser: 1.42 mg/dL — ABNORMAL HIGH (ref 0.61–1.24)
GFR calc Af Amer: 58 mL/min — ABNORMAL LOW (ref >60)
GFR calc non Af Amer: 50 mL/min — ABNORMAL LOW (ref >60)
Glucose, Bld: 137 mg/dL — ABNORMAL HIGH (ref 70–99)
Potassium: 4.5 mmol/L (ref 3.5–5.1)
Sodium: 138 mmol/L (ref 135–145)

## 2019-05-05 LAB — FOLATE: Folate: 11.3 ng/mL (ref >5.9)

## 2019-05-05 MED ORDER — DARBEPOETIN ALFA 300 MCG/0.6ML IJ SOSY
300.0000 ug | PREFILLED_SYRINGE | Freq: Once | INTRAMUSCULAR | Status: AC
  Administered 2019-05-05: 300 ug via SUBCUTANEOUS
  Filled 2019-05-05: qty 0.6

## 2019-05-05 NOTE — Assessment & Plan Note (Addendum)
#   CLL/SLL- relapsed most recently on ibrutinib; July 2020- CT Ab/Pelvis- CT scan shows no progressive/worsening lymphadenopathy.  Currently held ibrutinib because of progressive anemia [see discussion below].  White count-19/lymphocytosis slightly rising-concerning for recurrence.  # I discussed that I would recommend treating with Gazyva; discussed the schedule risk of infusion reactions/risk of infections etc.  At this time I think is reasonable to continue monitoring off therapy at this time-given his multiple comorbidities/Covid pandemic.   #Anemia-hemoglobin 8.6 question etiology.  Question CKD versus others-copper/folic acid deficiency/CLL.  Awaiting on copper/folic acid.  Continue with Aranesp.  #Hyperkalemia potassium 4.2; STABLE today.  Creatinine stable at 1.3.  Continue follow-up with nephrology.  # Abdominal pain/dyspepsia/unclear etiology STABLE;  Percocet for now.  S/p evaluation at pain clinic.   # # I discussed regarding Covid precautions/and also discussed proceeding with Covid vaccination when available.  Discussed that unfortunately the data safety and efficacy of vaccination is unclear especially in patients with immunocompromised state.  However, I think the benefits of the vaccination outweigh the potential risks.  # DISPOSITION: #  Aranesp today; # in 2 weeks- lab- cbc/bmp- Aranesp # in 4 weeks- lab- cbc/bmp- Aranesp # in 6 weeks- MD-cbc/bmp--possible aranesp-Dr.B

## 2019-05-05 NOTE — Progress Notes (Signed)
Should there [facility] [place] OFFICE PROGRESS NOTE  Patient Care Team: [name], MD as PCP - General (Internal Medicine)  Cancer Staging No matching staging information was found for the patient.   Oncology History Overview Note  # 2006- CLL STAGE IV; MAY 2011- WBC- 57K;Platelets-99;Hb-12/CT Bulky LN; BMBx- 80% Invol; del 11; START Benda-Ritux x4 [finished Sep 2011];   # July 2015-Progression; Sep 2015-START ibrutinib; CT scan DEC 2015- Improvement LN; Cont Ibrutinib 120m/d; NOV 2016 CT- 1-2CM LN [mild progression compared to Dec 2015];NOV 2016- FISH peripheral blood- NO MUTATIONS/CD-38 Positive; NOV 7th- CONT IBRUTINIB 2 pills/day; CT AUG 2017- STABLE;  DEC 6th PET- Mild RP LN/ Retrocrural LN  # OFF ibrutinib [? intol]- sep 2019- Jan 16th 2020; Re-start Ibrutinib; September 2020-stop ibrutinib [poor tolerance/worsening anemia]  # Jan 16th 2020- start aranesp  # DEC 2019- PAIN CONTRACT  #October 2019-bone marrow biopsy [worsening anemia]-question dyserythropoietic changes/small clone of CLL;  # [facility] H[facility]- NEG.  SA skin infection [Oct 2016] s/p clinda -------------------------------------------------------    DIAGNOSIS: CLL  STAGE:   IV      ;GOALS: palliative  CURRENT/MOST RECENT THERAPY : Ibrutinib.    CLL (chronic lymphocytic leukemia) (HCC)    INTERVAL HISTORY:  James [name] 69y.o.  male pleasant patient above history of CLL [currently off ibrutinib] and chronic abdominal pain of unclear etiology; worsening anemia of unclear etiology/? CKD is here for follow-up.  Patient continues to get erythropoietin stimulating agent/Aranesp every 2 weeks.  In the interim he was also evaluated at pain clinic-for his chronic abdominal pain.  Otherwise he feels fair.  Chronic fatigue.  Appetite is fair at best.  No weight loss.   Review of Systems  Constitutional: Positive for malaise/fatigue and weight loss. Negative for chills, diaphoresis and  fever.  HENT: Negative for nosebleeds and sore throat.   Eyes: Negative for double vision.  Respiratory: Negative for cough, hemoptysis, sputum production, shortness of breath and wheezing.   Cardiovascular: Negative for chest pain, palpitations, orthopnea and leg swelling.  Gastrointestinal: Positive for abdominal pain, constipation and nausea. Negative for blood in stool, diarrhea, heartburn, melena and vomiting.  Genitourinary: Negative for dysuria, frequency and urgency.  Skin: Negative.  Negative for itching and rash.  Neurological: Negative for dizziness, tingling, focal weakness, weakness and headaches.  Endo/Heme/Allergies: Does not bruise/bleed easily.  Psychiatric/Behavioral: Negative for depression. The patient is not nervous/anxious and does not have insomnia.       PAST MEDICAL HISTORY :  Past Medical History:  Diagnosis Date  . [place]ocytic [place]) ([place]   . Depression   . Diabetes mellitus without complication ([place]   . Hematuria   . Hyperlipidemia   . Hypertension     PAST SURGICAL HISTORY :   Past Surgical History:  Procedure Laterality Date  . CATARACT EXTRACTION W/ INTRAOCULAR LENS IMPLANT Bilateral   . CHOLECYSTECTOMY  1983  . COLONOSCOPY WITH PROPOFOL N/A 10/27/2017   Procedure: COLONOSCOPY WITH PROPOFOL;  Surgeon: EManya Silvas MD;  [facility];  Service: Endoscopy;  Laterality: N/A;  . ESOPHAGOGASTRODUODENOSCOPY (EGD) WITH PROPOFOL N/A 11/20/2016   Procedure: ESOPHAGOGASTRODUODENOSCOPY (EGD) WITH PROPOFOL;  Surgeon: EManya Silvas MD;  [facility];  Service: Endoscopy;  Laterality: N/A;  . ESOPHAGOGASTRODUODENOSCOPY (EGD) WITH PROPOFOL N/A 10/27/2017   Procedure: ESOPHAGOGASTRODUODENOSCOPY (EGD) WITH PROPOFOL;  Surgeon: EManya Silvas MD;  [facility];  Service: Endoscopy;  Laterality: N/A;  . LOWER EXTREMITY ANGIOGRAPHY Right 05/19/2017   Procedure: LOWER  EXTREMITY ANGIOGRAPHY;  Surgeon: Algernon Huxley, MD;  Location: [place] CV LAB;  Service: Cardiovascular;  Laterality: Right;  . LOWER EXTREMITY ANGIOGRAPHY Left 08/10/2018   Procedure: LOWER EXTREMITY ANGIOGRAPHY;  Surgeon: Algernon Huxley, MD;  Location: [place] CV LAB;  Service: Cardiovascular;  Laterality: Left;    FAMILY HISTORY :   Family History  Problem Relation Age of Onset  . Hypertension Sister     SOCIAL HISTORY:   Social History   Tobacco Use  . Smoking status: Current Every Day Smoker    Packs/day: 0.50    Years: 47.00    Pack years: 23.50    Types: Cigarettes  . Smokeless tobacco: Never Used  Substance Use Topics  . Alcohol use: Yes    Alcohol/week: 1.0 standard drinks    Types: 1 Glasses of wine per week    Comment:  almost none in last 6 months  . Drug use: No    ALLERGIES:  has No Known Allergies.  MEDICATIONS:  Current Outpatient Medications  Medication Sig Dispense Refill  . aspirin EC 81 MG tablet Take 81 mg by mouth daily.    Marland Kitchen atorvastatin (LIPITOR) 10 MG tablet Take 1 tablet (10 mg total) by mouth daily. 30 tablet 11  . Ensure (ENSURE) Take 237 mLs by mouth.    . folic acid (FOLVITE) 1 MG tablet Take 1 tablet (1 mg total) by mouth daily. 30 tablet 1  . iron polysaccharides (NU-IRON) 150 MG capsule Take 150 mg by mouth daily.     Marland Kitchen lisinopril (ZESTRIL) 10 MG tablet Take 1 tablet (10 mg total) by mouth daily. 30 tablet 0  . metFORMIN (GLUCOPHAGE) 1000 MG tablet Take 1,000 mg by mouth daily with breakfast.     . oxyCODONE-acetaminophen (PERCOCET/ROXICET) 5-325 MG tablet 1 pill every 8-12 hours 90 tablet 0  . sucralfate (CARAFATE) 1 g tablet Take 1 g by mouth 4 (four) times daily.     No current facility-administered medications for this visit.    PHYSICAL EXAMINATION: ECOG PERFORMANCE STATUS: 1 - Symptomatic but completely ambulatory  BP 110/71 (BP Location: Left Arm, Patient Position: Sitting, Cuff Size: Normal)   Pulse 60   Temp (!) 97.4 F (36.3 C) (Tympanic)   Wt 163 lb  (73.9 kg)   BMI 24.78 kg/m   Filed Weights   05/05/19 1005  Weight: 163 lb (73.9 kg)    Physical Exam  Constitutional: He is oriented to person, place, and time and well-developed, well-nourished, and in no distress.  He is alone.  Appears pale.  HENT:  Head: Normocephalic and atraumatic.  Mouth/Throat: Oropharynx is clear and moist. No oropharyngeal exudate.  Eyes: Pupils are equal, round, and reactive to light.  Cardiovascular: Normal rate and regular rhythm.  Pulmonary/Chest: No respiratory distress. He has no wheezes.  Decreased air entry bil.   Abdominal: Soft. Bowel sounds are normal. He exhibits no distension and no mass. There is no abdominal tenderness. There is no rebound and no guarding.  Musculoskeletal:        General: No tenderness or edema. Normal range of motion.     Cervical back: Normal range of motion and neck supple.  Neurological: He is alert and oriented to person, place, and time.  Skin: Skin is warm. There is pallor.  Multiple bruises noted in bilateral upper extremity.  Psychiatric: Affect normal.      LABORATORY DATA:  I have reviewed the data as listed    Component Value Date/Time  NA 138 05/05/2019 0930   NA 142 05/03/2014 1139   K 4.5 05/05/2019 0930   K 4.7 05/03/2014 1139   CL 109 05/05/2019 0930   CL 110 (H) 05/03/2014 1139   CO2 22 05/05/2019 0930   CO2 24 05/03/2014 1139   GLUCOSE 137 (H) 05/05/2019 0930   GLUCOSE 98 05/03/2014 1139   BUN 23 05/05/2019 0930   BUN 20 (H) 05/03/2014 1139   CREATININE 1.42 (H) 05/05/2019 0930   CREATININE 1.22 08/09/2014 1122   CALCIUM 8.5 (L) 05/05/2019 0930   CALCIUM 8.6 05/03/2014 1139   PROT 7.4 02/10/2019 0931   PROT 7.5 05/03/2014 1139   ALBUMIN 4.4 02/10/2019 0931   ALBUMIN 4.1 05/03/2014 1139   AST 12 (L) 02/10/2019 0931   AST 15 05/03/2014 1139   ALT 13 02/10/2019 0931   ALT 26 05/03/2014 1139   ALKPHOS 86 02/10/2019 0931   ALKPHOS 94 05/03/2014 1139   BILITOT 0.4 02/10/2019 0931    BILITOT 0.5 05/03/2014 1139   GFRNONAA 50 (L) 05/05/2019 0930   GFRNONAA >60 08/09/2014 1122   GFRAA 58 (L) 05/05/2019 0930   GFRAA >60 08/09/2014 1122    No results found for: SPEP, UPEP  Lab Results  Component Value Date   WBC 19.0 (H) 05/05/2019   NEUTROABS 3.4 05/05/2019   HGB 8.7 (L) 05/05/2019   HCT 28.6 (L) 05/05/2019   MCV 110.0 (H) 05/05/2019   PLT 111 (L) 05/05/2019      Chemistry      Component Value Date/Time   NA 138 05/05/2019 0930   NA 142 05/03/2014 1139   K 4.5 05/05/2019 0930   K 4.7 05/03/2014 1139   CL 109 05/05/2019 0930   CL 110 (H) 05/03/2014 1139   CO2 22 05/05/2019 0930   CO2 24 05/03/2014 1139   BUN 23 05/05/2019 0930   BUN 20 (H) 05/03/2014 1139   CREATININE 1.42 (H) 05/05/2019 0930   CREATININE 1.22 08/09/2014 1122      Component Value Date/Time   CALCIUM 8.5 (L) 05/05/2019 0930   CALCIUM 8.6 05/03/2014 1139   ALKPHOS 86 02/10/2019 0931   ALKPHOS 94 05/03/2014 1139   AST 12 (L) 02/10/2019 0931   AST 15 05/03/2014 1139   ALT 13 02/10/2019 0931   ALT 26 05/03/2014 1139   BILITOT 0.4 02/10/2019 0931   BILITOT 0.5 05/03/2014 1139       RADIOGRAPHIC STUDIES: I have personally reviewed the radiological images as listed and agreed with the findings in the report. No results found.   ASSESSMENT & PLAN:  CLL (chronic lymphocytic leukemia) ([place]) # CLL/SLL- relapsed most recently on ibrutinib; July 2020- CT Ab/Pelvis- CT scan shows no progressive/worsening lymphadenopathy.  Currently held ibrutinib because of progressive anemia [see discussion below].  White count-19/lymphocytosis slightly rising-concerning for recurrence.  # I discussed that I would recommend treating with Gazyva; discussed the schedule risk of infusion reactions/risk of infections etc.  At this time I think is reasonable to continue monitoring off therapy at this time-given his multiple comorbidities/Covid pandemic.   #Anemia-hemoglobin 8.6 question etiology.  Question  CKD versus others-copper/folic acid deficiency/CLL.  Awaiting on copper/folic acid.  Continue with Aranesp.  #Hyperkalemia potassium 4.2; STABLE today.  Creatinine stable at 1.3.  Continue follow-up with nephrology.  # Abdominal pain/dyspepsia/unclear etiology STABLE;  Percocet for now.  S/p evaluation at pain clinic.   # # I discussed regarding Covid precautions/and also discussed proceeding with Covid vaccination when available.  Discussed that unfortunately  the data safety and efficacy of vaccination is unclear especially in patients with immunocompromised state.  However, I think the benefits of the vaccination outweigh the potential risks.  # DISPOSITION: #  Aranesp today; # in 2 weeks- lab- cbc/bmp- Aranesp # in 4 weeks- lab- cbc/bmp- Aranesp # in 6 weeks- MD-cbc/bmp--possible aranesp-Dr.B    No orders of the defined types were placed in this encounter.  All questions were answered. The patient knows to call the clinic with any problems, questions or concerns.      Cammie Sickle, MD 05/05/2019 1:11 PM

## 2019-05-08 LAB — COPPER, SERUM: Copper: 100 ug/dL (ref 69–132)

## 2019-05-11 ENCOUNTER — Other Ambulatory Visit
Admission: RE | Admit: 2019-05-11 | Discharge: 2019-05-11 | Disposition: A | Discharge status: Home / Self Care | Payer: Medicare Other | Source: Ambulatory Visit | Attending: Pain Medicine | Admitting: Pain Medicine

## 2019-05-11 DIAGNOSIS — G894 Chronic pain syndrome: Secondary | ICD-10-CM | POA: Insufficient documentation

## 2019-05-11 DIAGNOSIS — E119 Type 2 diabetes mellitus without complications: Secondary | ICD-10-CM | POA: Diagnosis not present

## 2019-05-11 DIAGNOSIS — R1013 Epigastric pain: Secondary | ICD-10-CM | POA: Insufficient documentation

## 2019-05-11 DIAGNOSIS — Z789 Other specified health status: Secondary | ICD-10-CM | POA: Diagnosis not present

## 2019-05-11 DIAGNOSIS — M899 Disorder of bone, unspecified: Secondary | ICD-10-CM | POA: Insufficient documentation

## 2019-05-11 DIAGNOSIS — D696 Thrombocytopenia, unspecified: Secondary | ICD-10-CM | POA: Insufficient documentation

## 2019-05-11 DIAGNOSIS — E538 Deficiency of other specified B group vitamins: Secondary | ICD-10-CM | POA: Diagnosis not present

## 2019-05-11 DIAGNOSIS — E790 Hyperuricemia without signs of inflammatory arthritis and tophaceous disease: Secondary | ICD-10-CM | POA: Insufficient documentation

## 2019-05-11 DIAGNOSIS — R109 Unspecified abdominal pain: Secondary | ICD-10-CM | POA: Insufficient documentation

## 2019-05-11 DIAGNOSIS — Z79899 Other long term (current) drug therapy: Secondary | ICD-10-CM | POA: Diagnosis not present

## 2019-05-11 DIAGNOSIS — R1084 Generalized abdominal pain: Secondary | ICD-10-CM | POA: Diagnosis not present

## 2019-05-11 LAB — PLATELET COUNT: Platelets: 136 10*3/uL — ABNORMAL LOW (ref 150–400)

## 2019-05-11 LAB — COMPREHENSIVE METABOLIC PANEL
ALT: 12 U/L (ref 0–44)
AST: 12 U/L — ABNORMAL LOW (ref 15–41)
Albumin: 4.6 g/dL (ref 3.5–5.0)
Alkaline Phosphatase: 60 U/L (ref 38–126)
Anion gap: 6 (ref 5–15)
BUN: 24 mg/dL — ABNORMAL HIGH (ref 8–23)
CO2: 23 mmol/L (ref 22–32)
Calcium: 8.9 mg/dL (ref 8.9–10.3)
Chloride: 110 mmol/L (ref 98–111)
Creatinine, Ser: 1.34 mg/dL — ABNORMAL HIGH (ref 0.61–1.24)
GFR calc Af Amer: >60 mL/min (ref >60)
GFR calc non Af Amer: 53 mL/min — ABNORMAL LOW (ref >60)
Glucose, Bld: 117 mg/dL — ABNORMAL HIGH (ref 70–99)
Potassium: 5.2 mmol/L — ABNORMAL HIGH (ref 3.5–5.1)
Sodium: 139 mmol/L (ref 135–145)
Total Bilirubin: 0.5 mg/dL (ref 0.3–1.2)
Total Protein: 7.4 g/dL (ref 6.5–8.1)

## 2019-05-11 LAB — URINE DRUG SCREEN, QUALITATIVE (ARMC ONLY)
Amphetamines, Ur Screen: NOT DETECTED
Barbiturates, Ur Screen: NOT DETECTED
Benzodiazepine, Ur Scrn: NOT DETECTED
Cannabinoid 50 Ng, Ur ~~LOC~~: NOT DETECTED
Cocaine Metabolite,Ur ~~LOC~~: NOT DETECTED
MDMA (Ecstasy)Ur Screen: NOT DETECTED
Methadone Scn, Ur: NOT DETECTED
Opiate, Ur Screen: POSITIVE — AB
Phencyclidine (PCP) Ur S: NOT DETECTED
Tricyclic, Ur Screen: NOT DETECTED

## 2019-05-11 LAB — PLATELET FUNCTION ASSAY
Collagen / ADP: 99 seconds (ref 0–118)
Collagen / Epinephrine: 239 seconds — ABNORMAL HIGH (ref 0–193)

## 2019-05-11 LAB — SEDIMENTATION RATE: Sed Rate: 39 mm/hr — ABNORMAL HIGH (ref 0–20)

## 2019-05-11 LAB — C-REACTIVE PROTEIN: CRP: 0.6 mg/dL (ref <1.0)

## 2019-05-11 LAB — FOLATE: Folate: 10.4 ng/mL (ref >5.9)

## 2019-05-11 LAB — HEMOGLOBIN A1C
Hgb A1c MFr Bld: 6 % — ABNORMAL HIGH (ref 4.8–5.6)
Mean Plasma Glucose: 125.5 mg/dL

## 2019-05-11 LAB — AMYLASE: Amylase: 30 U/L (ref 28–100)

## 2019-05-11 LAB — LIPASE, BLOOD: Lipase: 28 U/L (ref 11–51)

## 2019-05-11 LAB — MAGNESIUM: Magnesium: 2.1 mg/dL (ref 1.7–2.4)

## 2019-05-11 LAB — VITAMIN B12: Vitamin B-12: 305 pg/mL (ref 180–914)

## 2019-05-11 LAB — URIC ACID: Uric Acid, Serum: 7.1 mg/dL (ref 3.7–8.6)

## 2019-05-12 LAB — URIC ACID, RANDOM URINE: Uric Acid, Urine: 26.2 mg/dL

## 2019-05-12 LAB — VITAMIN D 25 HYDROXY (VIT D DEFICIENCY, FRACTURES): Vit D, 25-Hydroxy: 13.74 ng/mL — ABNORMAL LOW (ref 30–100)

## 2019-05-14 ENCOUNTER — Other Ambulatory Visit: Payer: Self-pay | Admitting: *Deleted

## 2019-05-14 DIAGNOSIS — C9112 Chronic lymphocytic leukemia of B-cell type in relapse: Secondary | ICD-10-CM

## 2019-05-14 DIAGNOSIS — C9192 Lymphoid leukemia, unspecified, in relapse: Secondary | ICD-10-CM

## 2019-05-14 DIAGNOSIS — C8300 Small cell B-cell lymphoma, unspecified site: Secondary | ICD-10-CM

## 2019-05-14 MED ORDER — OXYCODONE-ACETAMINOPHEN 5-325 MG PO TABS: ORAL_TABLET | ORAL | 0 refills | Status: DC

## 2019-05-14 NOTE — Telephone Encounter (Signed)
Patient called cancer center requesting refill of oxycodone/acetaminophen 5-325 mg.  Patient to take 1 tablet every 8-12 hours as needed for pain.  As mandated by the [place] STOP Act (Strengthen Opioid Misuse Prevention), the [place] Controlled Substance Reporting System ([place]) was reviewed for this patient. Below is the past 62-months of controlled substance prescriptions as displayed by the registry. I have personally consulted with my supervising physician, Dr. Grayland Ormond who agrees that continuation of opiate therapy is medically appropriate at this time and agrees to provide continual monitoring, including urine/blood drug screens, as indicated. Refill is appropriate on or after 05/13/2019.  [place]CCSRS reviewed: Reviewed last filled on 04/14/19. Appropriate for refill.    Faythe Casa, NP 05/14/2019 11:08 AM [phone]

## 2019-05-18 ENCOUNTER — Encounter: Payer: Self-pay | Admitting: Pain Medicine

## 2019-05-18 ENCOUNTER — Other Ambulatory Visit: Payer: Self-pay

## 2019-05-18 NOTE — Progress Notes (Signed)
Patient: James [name]  Service Category: E/M  Provider: Gaspar Cola, MD  DOB: [birth year]  DOS: 05/19/2019  Location: Office  MRN: [id number]  Setting: Ambulatory outpatient  Referring Provider: [name], MD  Type: Established Patient  Specialty: Interventional Pain Management  PCP: [name], MD  Location: Remote location  Delivery: TeleHealth     Virtual Encounter - Pain Management PROVIDER NOTE: Information contained herein reflects review and annotations entered in association with encounter. Interpretation of such information and data should be left to medically-trained personnel. Information provided to patient can be located elsewhere in the medical record under "Patient Instructions". Document created using STT-dictation technology, any transcriptional errors that may result from process are unintentional.    Contact & Pharmacy Preferred: [phone] Home: [phone] (home) Mobile: [phone] (mobile) E-mail: No e-mail address on record  [facility] ([address]), [place] [address] [place]) [place] [address] Phone: [phone] Fax: [place], [address] [name] [address] Phone: [phone] Fax: [phone]   Pre-screening note:  Our staff contacted James [name] and offered him an "in person", "face-to-face" appointment versus a telephone encounter. He indicated preferring the telephone encounter, at this time.   Primary Reason(s) for Virtual Visit: Encounter for evaluation before starting new chronic pain management plan of care (Level of risk: moderate) COVID-19*  Social distancing based on CDC ans AMA recommendations.    I contacted James [name] on 05/19/2019 via telephone.      I clearly identified myself as Gaspar Cola, MD. I verified that I was speaking with the correct person using two  identifiers (Name: James [name], and date of birth: 05-11-1950).  Advanced Informed Consent I sought verbal advanced consent from James [name] for virtual visit interactions. I informed James [name] of possible security and privacy concerns, risks, and limitations associated with providing "not-in-person" medical evaluation and management services. I also informed James [name] of the availability of "in-person" appointments. Finally, I informed him that there would be a charge for the virtual visit and that he could be  personally, fully or partially, financially responsible for it. James [name] expressed understanding and agreed to proceed.   Historic Elements   James [name] is a 69 y.o. year old, male patient evaluated today after his last encounter by our practice on 05/03/2019. James [name]  has a past medical history of CLL (chronic lymphocytic leukemia) ([place]), Depression, Diabetes mellitus without complication ([place]), Hematuria, Hyperlipidemia, and Hypertension. He also  has a past surgical history that includes Cholecystectomy (1983); Esophagogastroduodenoscopy (egd) with propofol (N/A, 11/20/2016); Cataract extraction w/ intraocular lens implant (Bilateral); Lower Extremity Angiography (Right, 05/19/2017); Esophagogastroduodenoscopy (egd) with propofol (N/A, 10/27/2017); Colonoscopy with propofol (N/A, 10/27/2017); and Lower Extremity Angiography (Left, 08/10/2018). James [name] has a current medication list which includes the following prescription(s): aspirin ec, atorvastatin, clopidogrel, ensure, folic acid, iron polysaccharides, lisinopril, metformin, oxycodone-acetaminophen, calcium plus d3 absorbable, vitamin d3, and [START ON 05/20/2019] ergocalciferol. He  reports that he has been smoking cigarettes. He has a 23.50 pack-year smoking history. He has never used smokeless tobacco. He reports current alcohol use of about 1.0 standard drinks of alcohol per week. He reports that he does not use drugs. James [name]  has No Known Allergies.   HPI  He is being evaluated for review of studies ordered on initial visit and to consider treatment plan options. Today I  went over the results of his tests. These were explained in "Layman's terms". During today's appointment I went over my diagnostic impression, as well as the proposed treatment plan.  According to the patient he has been experiencing chronic epigastric abdominal pain for the past 5 years.  This pain has been worsening with time.  He indicates having had diagnostic endoscopies, CT scans, ultrasounds, none of which have provided any useful information as to what is going on.  He does have a history significant for chronic lymphocytic leukemia.  He denies any prior nerve blocks or ever having been seen at another pain clinic.  He has been treated by gastroenterologists such as Dr. Ferne Coe.  The pain does not seem to change with intake of meals, liquids, or with bowel movements or voiding.  He denies ever having tried pregabalin (Lyrica) or gabapentin (Neurontin).  He had a complete abdominal ultrasound in 2016, but nothing since.  This chronic abdominal pain has caused him to lose over 50 pounds over the last couple years and the only thing that he seems to get some benefit from his when he takes oxycodone/APAP 5/325 1 tablet p.o. 3 times daily, which he has been doing nonstop for the past 2 years.  Today I went over the results of all of his lab work explained to him in layman's terms any abnormalities.  I also explained to him the significance of the abnormalities.  Once we completed that and he had no questions about it then I moved onto telling him what we could do for this abdominal pain, starting with a diagnostic bilateral celiac plexus block.  We talked about the possible results of that diagnostic injection and the possibility of having to diagnostic abdominal wall injections should he not get any benefit from the celiac plexus block.  I asked him if  he would be interested in pursuing the plan and he indicated that he would.  Today I also explained to the patient that he needs to be NPO x8 hours prior to the procedure.  I also informed him that he would need to stop his Plavix for 7 days prior to procedure, but that he should not stop it right now until he knows when he would be scheduled for the procedure.  I also told him that right after the procedure he needs to wait at least 2 hours before his restarts the Plavix, but he should restart it either that same day or the next morning.  He understood and accepted.  I reminded him that this type of procedure would normally do it under IV sedation and that he therefore needed a driver.  Controlled Substance Pharmacotherapy Assessment REMS (Risk Evaluation and Mitigation Strategy)  Analgesic: Oxycodone/APAP 5/325, 1 tab PO q 8 hrs (15 mg/day of oxycodone IR)  Highest recorded MME/day: 22.5 mg/day MME/day: 22.5 mg/day   Monitoring: [place] PMP: PDMP reviewed during this encounter.       Not applicable at this point since we have not taken over the patient's medication management yet. List of other Serum/Urine Drug Screening Test(s):  Lab Results  Component Value Date   COCAINSCRNUR NONE DETECTED 05/11/2019   COCAINSCRNUR NONE DETECTED 05/28/2018   COCAINSCRNUR NONE DETECTED 02/04/2018   THCU NONE DETECTED 05/11/2019   THCU NONE DETECTED 05/28/2018   THCU NONE DETECTED 02/04/2018   List of all UDS test(s) done:  No results found for: TOXASSSELUR, SUMMARY Last UDS on record: No results found for: TOXASSSELUR, SUMMARY UDS interpretation: No unexpected  findings.          Medication Assessment Form: Patient introduced to form today Treatment compliance: Treatment may start today if patient agrees with proposed plan. Evaluation of compliance is not applicable at this point Risk Assessment Profile: Aberrant behavior: See initial evaluations. None observed or detected today Comorbid factors  increasing risk of overdose: See initial evaluation. No additional risks detected today Opioid risk tool (ORT):  Opioid Risk  05/03/2019  Alcohol 0  Illegal Drugs 0  Rx Drugs 0  Alcohol 0  Illegal Drugs 0  Rx Drugs 0  Age between 16-45 years  0  History of Preadolescent Sexual Abuse 0  Psychological Disease 0  Depression 0  Opioid Risk Tool Scoring 0  Opioid Risk Interpretation Low Risk    ORT Scoring interpretation table:  Score <3 = Low Risk for SUD  Score between 4-7 = Moderate Risk for SUD  Score >8 = High Risk for Opioid Abuse   Risk of substance use disorder (SUD): Low  Pharmacologic Plan: Non-opioid analgesic therapy offered.             Meds   Current Outpatient Medications:  .  aspirin EC 81 MG tablet, Take 81 mg by mouth daily., Disp: , Rfl:  .  atorvastatin (LIPITOR) 10 MG tablet, Take 1 tablet (10 mg total) by mouth daily., Disp: 30 tablet, Rfl: 11 .  clopidogrel (PLAVIX) 75 MG tablet, Take 75 mg by mouth daily., Disp: , Rfl:  .  Ensure (ENSURE), Take 237 mLs by mouth., Disp: , Rfl:  .  folic acid (FOLVITE) 1 MG tablet, Take 1 tablet (1 mg total) by mouth daily., Disp: 30 tablet, Rfl: 1 .  iron polysaccharides (NU-IRON) 150 MG capsule, Take 150 mg by mouth daily. , Disp: , Rfl:  .  lisinopril (ZESTRIL) 10 MG tablet, Take 1 tablet (10 mg total) by mouth daily., Disp: 30 tablet, Rfl: 0 .  metFORMIN (GLUCOPHAGE) 1000 MG tablet, Take 1,000 mg by mouth daily with breakfast. , Disp: , Rfl:  .  oxyCODONE-acetaminophen (PERCOCET/ROXICET) 5-325 MG tablet, 1 pill every 8-12 hours, Disp: 90 tablet, Rfl: 0 .  Calcium Carb-Cholecalciferol (CALCIUM PLUS D3 ABSORBABLE) [phone] MG-UNIT CAPS, Take 1 capsule by mouth 2 (two) times daily with a meal., Disp: 60 capsule, Rfl: 5 .  Cholecalciferol (VITAMIN D3) 125 MCG (5000 UT) CAPS, Take 1 capsule (5,000 Units total) by mouth daily with breakfast. Take along with calcium and magnesium., Disp: 30 capsule, Rfl: 5 .  [START ON 05/20/2019]  ergocalciferol (VITAMIN D2) 1.25 MG (50000 UT) capsule, Take 1 capsule (50,000 Units total) by mouth 2 (two) times a week. X 6 weeks., Disp: 12 capsule, Rfl: 0  Laboratory Chemistry Profile   Screening Lab Results  Component Value Date   SARSCOV2NAA NEGATIVE 12/28/2018   HIV Non Reactive 12/29/2018    Inflammation (CRP: Acute Phase) (ESR: Chronic Phase) Lab Results  Component Value Date   CRP 0.6 05/11/2019   ESRSEDRATE 39 (H) 05/11/2019   LATICACIDVEN 1.3 12/28/2018                         Rheumatology Lab Results  Component Value Date   LABURIC 7.1 05/11/2019                        Renal Lab Results  Component Value Date   BUN 35 (H) 05/19/2019   CREATININE 1.80 (H) 05/19/2019   GFRAA 43 (L)  05/19/2019   GFRNONAA 37 (L) 05/19/2019                             Hepatic Lab Results  Component Value Date   AST 12 (L) 05/11/2019   ALT 12 05/11/2019   ALBUMIN 4.6 05/11/2019   ALKPHOS 60 05/11/2019   AMYLASE 30 05/11/2019   LIPASE 28 05/11/2019                        Electrolytes Lab Results  Component Value Date   NA 140 05/19/2019   K 5.9 (H) 05/19/2019   CL 111 05/19/2019   CALCIUM 8.9 05/19/2019   MG 2.1 05/11/2019   PHOS 3.3 02/06/2015                        Neuropathy Lab Results  Component Value Date   VITAMINB12 305 05/11/2019   FOLATE 10.4 05/11/2019   HGBA1C 6.0 (H) 05/11/2019   HIV Non Reactive 12/29/2018                        CNS No results found.  Bone Lab Results  Component Value Date   VD25OH 13.74 (L) 05/11/2019                         Coagulation Lab Results  Component Value Date   INR 1.04 02/09/2018   LABPROT 13.5 02/09/2018   APTT 30 02/09/2018   PLT 112 (L) 05/19/2019                        Cardiovascular Lab Results  Component Value Date   TROPONINI < 0.02 11/16/2013   HGB 8.5 (L) 05/19/2019   HCT 28.9 (L) 05/19/2019                         ID Lab Results  Component Value Date   HIV Non Reactive 12/29/2018    [place] NEGATIVE 12/28/2018   MICROTEXT  11/19/2013       COMMENT                   NO SALMONELLA OR SHIGELLA ISOLATED   COMMENT                   NO PATHOGENIC E.COLI DETECTED   COMMENT                   NO CAMPYLOBACTER ANTIGEN DETECTED   ANTIBIOTIC                                                        Cancer No results found.  Endocrine No results found.  Note: Lab results reviewed.  Recent Diagnostic Imaging Review   Complexity Note: No results found under the [facility] electronic medical record.              Assessment  The primary encounter diagnosis was Chronic epigastric pain (Primary Area of Pain). Diagnoses of Chronic abdominal pain, Generalized abdominal pain, CLL (chronic lymphocytic leukemia) (HCC), Chronic pain syndrome, Thrombocytopenia (HCC), Chronic anticoagulation (Plavix), and Vitamin D deficiency were also  pertinent to this visit.  Plan of Care  I have discontinued James [name]'s sucralfate. I am also having him start on ergocalciferol, Vitamin D3, and Calcium Plus D3 Absorbable. Additionally, I am having him maintain his aspirin EC, atorvastatin, iron polysaccharides, metFORMIN, folic acid, lisinopril, Ensure, oxyCODONE-acetaminophen, and clopidogrel. Pharmacotherapy (Medications Ordered): Meds ordered this encounter  Medications  . ergocalciferol (VITAMIN D2) 1.25 MG (50000 UT) capsule    Sig: Take 1 capsule (50,000 Units total) by mouth 2 (two) times a week. X 6 weeks.    Dispense:  12 capsule    Refill:  0    Fill one day early if pharmacy is closed on scheduled refill date. May substitute for generic if available.  . Cholecalciferol (VITAMIN D3) 125 MCG (5000 UT) CAPS    Sig: Take 1 capsule (5,000 Units total) by mouth daily with breakfast. Take along with calcium and magnesium.    Dispense:  30 capsule    Refill:  5    Fill one day early if pharmacy is closed on scheduled refill date. May substitute for generic if available. May  substitute with similar over-the-counter product.  . Calcium Carb-Cholecalciferol (CALCIUM PLUS D3 ABSORBABLE) [phone] MG-UNIT CAPS    Sig: Take 1 capsule by mouth 2 (two) times daily with a meal.    Dispense:  60 capsule    Refill:  5    Fill one day early if pharmacy is closed on scheduled refill date. May substitute for generic if available.    Procedure Orders     CELIAC PLEXUS BLOCK Lab Orders  No laboratory test(s) ordered today   Imaging Orders  No imaging studies ordered today   Referral Orders  No referral(s) requested today    Orders:  Orders Placed This Encounter  Procedures  . CELIAC PLEXUS BLOCK    Standing Status:   Future    Standing Expiration Date:   06/19/2019    Scheduling Instructions:     Side: Bilateral     Sedation: With Sedation.     TIMEFRAME: ASAA    Order Specific Question:   Where will this procedure be performed?    Answer:   [facility]   Pharmacological management options:  Opioid Analgesics: I will not be prescribing any opioids at this time Membrane stabilizer: Options discussed, including a trial. Muscle relaxant: We have discussed the possibility of a trial NSAID: Trial discussed. Other analgesic(s): To be determined at a later time    Interventional treatment options: Planned, scheduled, and/or pending:    NOTE: PLAVIX ANTICOAGULATION  (Stop: 7 days  Restart: 2 hrs) Diagnostic bilateral celiac plexus block #1 under fluoroscopic guidance and IV sedation   Under consideration:   Diagnostic bilateral celiac plexus block #1 under fluoroscopic guidance and IV sedation   Therapeutic/palliative (PRN):   None at this time    Total duration of non-face-to-face encounter: 35 minutes.  Follow-up plan:   Return for Procedure (w/ sedation): (B) CPB #1, (Blood-thinner Protocol).    Recent Visits Date Type Provider Dept  05/03/19 Office Visit Milinda Pointer, MD [facility]  Showing recent visits within past 90  days and meeting all other requirements   Today's Visits Date Type Provider Dept  05/19/19 Telemedicine Milinda Pointer, MD [facility]  Showing today's visits and meeting all other requirements   Future Appointments No visits were found meeting these conditions.  Showing future appointments within next 90 days and meeting all other requirements   Primary Care Physician:  [name], MD Location: Telephone Virtual Visit Note by: Gaspar Cola, MD Date: 05/19/2019; Time: 5:18 PM  Note: This dictation was prepared with Dragon dictation. Any transcriptional errors that may result from this process are unintentional.  Disclaimer:  * Given the special circumstances of the COVID-19 pandemic, the federal government has announced that the Office for Civil Rights (OCR) will exercise its enforcement discretion and will not impose penalties on physicians using telehealth in the event of noncompliance with regulatory requirements under the [place] and [place] (HIPAA) in connection with the good faith provision of telehealth during the VELFY-10 national public health emergency. ([place])

## 2019-05-19 ENCOUNTER — Ambulatory Visit: Payer: Medicare Other | Attending: Pain Medicine | Admitting: Pain Medicine

## 2019-05-19 ENCOUNTER — Other Ambulatory Visit: Payer: Self-pay

## 2019-05-19 ENCOUNTER — Inpatient Hospital Stay: Payer: Medicare Other

## 2019-05-19 VITALS — BP 108/66 | HR 52

## 2019-05-19 DIAGNOSIS — G8929 Other chronic pain: Secondary | ICD-10-CM

## 2019-05-19 DIAGNOSIS — R1084 Generalized abdominal pain: Secondary | ICD-10-CM | POA: Diagnosis not present

## 2019-05-19 DIAGNOSIS — R1013 Epigastric pain: Secondary | ICD-10-CM

## 2019-05-19 DIAGNOSIS — Z7902 Long term (current) use of antithrombotics/antiplatelets: Secondary | ICD-10-CM

## 2019-05-19 DIAGNOSIS — R109 Unspecified abdominal pain: Secondary | ICD-10-CM | POA: Diagnosis not present

## 2019-05-19 DIAGNOSIS — C9110 Chronic lymphocytic leukemia of B-cell type not having achieved remission: Secondary | ICD-10-CM

## 2019-05-19 DIAGNOSIS — D631 Anemia in chronic kidney disease: Secondary | ICD-10-CM

## 2019-05-19 DIAGNOSIS — D696 Thrombocytopenia, unspecified: Secondary | ICD-10-CM

## 2019-05-19 DIAGNOSIS — E559 Vitamin D deficiency, unspecified: Secondary | ICD-10-CM

## 2019-05-19 DIAGNOSIS — G894 Chronic pain syndrome: Secondary | ICD-10-CM

## 2019-05-19 DIAGNOSIS — N183 Chronic kidney disease, stage 3 unspecified: Secondary | ICD-10-CM | POA: Diagnosis not present

## 2019-05-19 DIAGNOSIS — Z7901 Long term (current) use of anticoagulants: Secondary | ICD-10-CM

## 2019-05-19 LAB — BASIC METABOLIC PANEL
Anion gap: 6 (ref 5–15)
BUN: 35 mg/dL — ABNORMAL HIGH (ref 8–23)
CO2: 23 mmol/L (ref 22–32)
Calcium: 8.9 mg/dL (ref 8.9–10.3)
Chloride: 111 mmol/L (ref 98–111)
Creatinine, Ser: 1.8 mg/dL — ABNORMAL HIGH (ref 0.61–1.24)
GFR calc Af Amer: 43 mL/min — ABNORMAL LOW (ref >60)
GFR calc non Af Amer: 37 mL/min — ABNORMAL LOW (ref >60)
Glucose, Bld: 111 mg/dL — ABNORMAL HIGH (ref 70–99)
Potassium: 5.9 mmol/L — ABNORMAL HIGH (ref 3.5–5.1)
Sodium: 140 mmol/L (ref 135–145)

## 2019-05-19 LAB — CBC WITH DIFFERENTIAL/PLATELET
Abs Immature Granulocytes: 0.03 10*3/uL (ref 0.00–0.07)
Basophils Absolute: 0.2 10*3/uL — ABNORMAL HIGH (ref 0.0–0.1)
Basophils Relative: 1 %
Eosinophils Absolute: 0.2 10*3/uL (ref 0.0–0.5)
Eosinophils Relative: 1 %
HCT: 28.9 % — ABNORMAL LOW (ref 39.0–52.0)
Hemoglobin: 8.5 g/dL — ABNORMAL LOW (ref 13.0–17.0)
Immature Granulocytes: 0 %
Lymphocytes Relative: 62 %
Lymphs Abs: 12.1 10*3/uL — ABNORMAL HIGH (ref 0.7–4.0)
MCH: 32.2 pg (ref 26.0–34.0)
MCHC: 29.4 g/dL — ABNORMAL LOW (ref 30.0–36.0)
MCV: 109.5 fL — ABNORMAL HIGH (ref 80.0–100.0)
Monocytes Absolute: 3.2 10*3/uL — ABNORMAL HIGH (ref 0.1–1.0)
Monocytes Relative: 17 %
Neutro Abs: 3.7 10*3/uL (ref 1.7–7.7)
Neutrophils Relative %: 19 %
Platelets: 112 10*3/uL — ABNORMAL LOW (ref 150–400)
RBC: 2.64 MIL/uL — ABNORMAL LOW (ref 4.22–5.81)
RDW: 15.9 % — ABNORMAL HIGH (ref 11.5–15.5)
WBC: 19.4 10*3/uL — ABNORMAL HIGH (ref 4.0–10.5)
nRBC: 0 % (ref 0.0–0.2)

## 2019-05-19 MED ORDER — ERGOCALCIFEROL 1.25 MG (50000 UT) PO CAPS: 50000.0000 [IU] | ORAL_CAPSULE | ORAL | 0 refills | Status: AC

## 2019-05-19 MED ORDER — DARBEPOETIN ALFA 300 MCG/0.6ML IJ SOSY
300.0000 ug | PREFILLED_SYRINGE | Freq: Once | INTRAMUSCULAR | Status: AC
  Administered 2019-05-19: 300 ug via SUBCUTANEOUS
  Filled 2019-05-19: qty 0.6

## 2019-05-19 MED ORDER — CALCIUM PLUS D3 ABSORBABLE 600-2500 MG-UNIT PO CAPS: 1.0000 | ORAL_CAPSULE | Freq: Two times a day (BID) | ORAL | 5 refills | Status: DC

## 2019-05-19 MED ORDER — VITAMIN D3 125 MCG (5000 UT) PO CAPS: 1.0000 | ORAL_CAPSULE | Freq: Every day | ORAL | 5 refills | Status: DC

## 2019-05-19 NOTE — Patient Instructions (Signed)
____________________________________________________________________________________________  Preparing for Procedure with Sedation  Procedure appointments are limited to planned procedures: . No Prescription Refills. . No disability issues will be discussed. . No medication changes will be discussed.  Instructions: . Oral Intake: Do not eat or drink anything for at least 8 hours prior to your procedure. . Transportation: Public transportation is not allowed. Bring an adult driver. The driver must be physically present in our waiting room before any procedure can be started. . Physical Assistance: Bring an adult physically capable of assisting you, in the event you need help. This adult should keep you company at home for at least 6 hours after the procedure. . Blood Pressure Medicine: Take your blood pressure medicine with a sip of water the morning of the procedure. . Blood thinners: Notify our staff if you are taking any blood thinners. Depending on which one you take, there will be specific instructions on how and when to stop it. . Diabetics on insulin: Notify the staff so that you can be scheduled 1st case in the morning. If your diabetes requires high dose insulin, take only  of your normal insulin dose the morning of the procedure and notify the staff that you have done so. . Preventing infections: Shower with an antibacterial soap the morning of your procedure. . Build-up your immune system: Take 1000 mg of Vitamin C with every meal (3 times a day) the day prior to your procedure. . Antibiotics: Inform the staff if you have a condition or reason that requires you to take antibiotics before dental procedures. . Pregnancy: If you are pregnant, call and cancel the procedure. . Sickness: If you have a cold, fever, or any active infections, call and cancel the procedure. . Arrival: You must be in the facility at least 30 minutes prior to your scheduled procedure. . Children: Do not bring  children with you. . Dress appropriately: Bring dark clothing that you would not mind if they get stained. . Valuables: Do not bring any jewelry or valuables.  Reasons to call and reschedule or cancel your procedure: (Following these recommendations will minimize the risk of a serious complication.) . Surgeries: Avoid having procedures within 2 weeks of any surgery. (Avoid for 2 weeks before or after any surgery). . Flu Shots: Avoid having procedures within 2 weeks of a flu shots or . (Avoid for 2 weeks before or after immunizations). . Barium: Avoid having a procedure within 7-10 days after having had a radiological study involving the use of radiological contrast. (Myelograms, Barium swallow or enema study). . Heart attacks: Avoid any elective procedures or surgeries for the initial 6 months after a "Myocardial Infarction" (Heart Attack). . Blood thinners: It is imperative that you stop these medications before procedures. Let us know if you if you take any blood thinner.  . Infection: Avoid procedures during or within two weeks of an infection (including chest colds or gastrointestinal problems). Symptoms associated with infections include: Localized redness, fever, chills, night sweats or profuse sweating, burning sensation when voiding, cough, congestion, stuffiness, runny nose, sore throat, diarrhea, nausea, vomiting, cold or Flu symptoms, recent or current infections. It is specially important if the infection is over the area that we intend to treat. . Heart and lung problems: Symptoms that may suggest an active cardiopulmonary problem include: cough, chest pain, breathing difficulties or shortness of breath, dizziness, ankle swelling, uncontrolled high or unusually low blood pressure, and/or palpitations. If you are experiencing any of these symptoms, cancel your procedure and contact   your primary care physician for an evaluation.  Remember:  Regular Business hours are:  Monday to Thursday  8:00 AM to 4:00 PM  Provider's Schedule: Cayman Brogden, MD:  Procedure days: Tuesday and Thursday 7:30 AM to 4:00 PM  Bilal Lateef, MD:  Procedure days: Monday and Wednesday 7:30 AM to 4:00 PM ____________________________________________________________________________________________   ____________________________________________________________________________________________  Blood Thinners  IMPORTANT NOTICE:  If you take any of these, make sure to notify the nursing staff.  Failure to do so may result in injury.  Recommended time intervals to stop and restart blood-thinners, before & after invasive procedures  Generic Name Brand Name Stop Time. Must be stopped at least this long before procedures. After procedures, wait at least this long before re-starting.  Abciximab Reopro 15 days 2 hrs  Alteplase Activase 10 days 10 days  Anagrelide Agrylin    Apixaban Eliquis 3 days 6 hrs  Cilostazol Pletal 3 days 5 hrs  Clopidogrel Plavix 7-10 days 2 hrs  Dabigatran Pradaxa 5 days 6 hrs  Dalteparin Fragmin 24 hours 4 hrs  Dipyridamole Aggrenox 11days 2 hrs  Edoxaban Lixiana; Savaysa 3 days 2 hrs  Enoxaparin  Lovenox 24 hours 4 hrs  Eptifibatide Integrillin 8 hours 2 hrs  Fondaparinux  Arixtra 72 hours 12 hrs  Prasugrel Effient 7-10 days 6 hrs  Reteplase Retavase 10 days 10 days  Rivaroxaban Xarelto 3 days 6 hrs  Ticagrelor Brilinta 5-7 days 6 hrs  Ticlopidine Ticlid 10-14 days 2 hrs  Tinzaparin Innohep 24 hours 4 hrs  Tirofiban Aggrastat 8 hours 2 hrs  Warfarin Coumadin 5 days 2 hrs   Other medications with blood-thinning effects  Product indications Generic (Brand) names Note  Cholesterol Lipitor Stop 4 days before procedure  Blood thinner (injectable) Heparin (LMW or LMWH Heparin) Stop 24 hours before procedure  Cancer Ibrutinib (Imbruvica) Stop 7 days before procedure  Malaria/Rheumatoid Hydroxychloroquine (Plaquenil) Stop 11 days before procedure  Thrombolytics  10  days before or after procedures   Over-the-counter (OTC) Products with blood-thinning effects  Product Common names Stop Time  Aspirin > 325 mg Goody Powders, Excedrin, etc. 11 days  Aspirin ? 81 mg  7 days  Fish oil  4 days  Garlic supplements  7 days  Ginkgo biloba  36 hours  Ginseng  24 hours  NSAIDs Ibuprofen, Naprosyn, etc. 3 days  Vitamin E  4 days   ____________________________________________________________________________________________   

## 2019-05-20 ENCOUNTER — Telehealth: Payer: Self-pay

## 2019-05-20 NOTE — Telephone Encounter (Signed)
I have reviewed his last visit and there is order to get medical clearance.  Thank you for checking on that.

## 2019-05-20 NOTE — Telephone Encounter (Signed)
He is having a procedure on 06/01/19 and the order said to stop his plavix for 7 days. I told him to stop 05/25/19. I wanted to make sure you weren't waiting on the ok from his doctor to stop them because it didn't say anything in the notes about getting auth from his doctor.

## 2019-05-31 NOTE — Telephone Encounter (Signed)
Contacted patient and he did stop his Plavix on 05/25/19.  We will see him tomorrow at 1230 for procedure with Dr Dossie Arbour.

## 2019-05-31 NOTE — Telephone Encounter (Signed)
He is coming tomorrow for a procedure and I haven't heard anything about medical clearance to stop plavix for 7 days. Did he have clearance and did someone call him to stop the plavix?

## 2019-06-01 ENCOUNTER — Encounter: Payer: Self-pay | Admitting: Pain Medicine

## 2019-06-01 ENCOUNTER — Other Ambulatory Visit: Payer: Self-pay

## 2019-06-01 ENCOUNTER — Ambulatory Visit
Admission: RE | Admit: 2019-06-01 | Discharge: 2019-06-01 | Disposition: A | Discharge status: Home / Self Care | Payer: Medicare Other | Source: Ambulatory Visit | Attending: Pain Medicine | Admitting: Pain Medicine

## 2019-06-01 ENCOUNTER — Ambulatory Visit (HOSPITAL_BASED_OUTPATIENT_CLINIC_OR_DEPARTMENT_OTHER): Payer: Medicare Other | Admitting: Pain Medicine

## 2019-06-01 VITALS — BP 101/55 | HR 60 | Temp 98.7°F | Resp 15 | Ht 68.0 in | Wt 157.0 lb

## 2019-06-01 DIAGNOSIS — E861 Hypovolemia: Secondary | ICD-10-CM | POA: Diagnosis present

## 2019-06-01 DIAGNOSIS — D696 Thrombocytopenia, unspecified: Secondary | ICD-10-CM | POA: Diagnosis present

## 2019-06-01 DIAGNOSIS — R109 Unspecified abdominal pain: Secondary | ICD-10-CM | POA: Diagnosis present

## 2019-06-01 DIAGNOSIS — G8929 Other chronic pain: Secondary | ICD-10-CM

## 2019-06-01 DIAGNOSIS — Z7901 Long term (current) use of anticoagulants: Secondary | ICD-10-CM | POA: Diagnosis present

## 2019-06-01 DIAGNOSIS — C8300 Small cell B-cell lymphoma, unspecified site: Secondary | ICD-10-CM | POA: Diagnosis present

## 2019-06-01 DIAGNOSIS — R001 Bradycardia, unspecified: Secondary | ICD-10-CM | POA: Diagnosis present

## 2019-06-01 DIAGNOSIS — I9589 Other hypotension: Secondary | ICD-10-CM | POA: Insufficient documentation

## 2019-06-01 DIAGNOSIS — R1013 Epigastric pain: Secondary | ICD-10-CM | POA: Diagnosis present

## 2019-06-01 DIAGNOSIS — R1084 Generalized abdominal pain: Secondary | ICD-10-CM | POA: Diagnosis present

## 2019-06-01 DIAGNOSIS — I959 Hypotension, unspecified: Secondary | ICD-10-CM | POA: Insufficient documentation

## 2019-06-01 MED ORDER — DEXAMETHASONE SODIUM PHOSPHATE 10 MG/ML IJ SOLN
10.0000 mg | Freq: Once | INTRAMUSCULAR | Status: AC
  Administered 2019-06-01: 10 mg
  Filled 2019-06-01: qty 1

## 2019-06-01 MED ORDER — MIDAZOLAM HCL 5 MG/5ML IJ SOLN
1.0000 mg | INTRAMUSCULAR | Status: AC | PRN
  Administered 2019-06-01: 2 mg via INTRAVENOUS
  Filled 2019-06-01: qty 5

## 2019-06-01 MED ORDER — GLYCOPYRROLATE 0.2 MG/ML IJ SOLN
0.2000 mg | Freq: Once | INTRAMUSCULAR | Status: AC
  Administered 2019-06-01: 0.2 mg via INTRAVENOUS

## 2019-06-01 MED ORDER — LACTATED RINGERS IV SOLN
1000.0000 mL | Freq: Once | INTRAVENOUS | Status: AC
  Administered 2019-06-01: 1000 mL via INTRAVENOUS

## 2019-06-01 MED ORDER — BUPIVACAINE-EPINEPHRINE (PF) 0.25% -1:200000 IJ SOLN
14.0000 mL | Freq: Once | INTRAMUSCULAR | Status: AC
  Administered 2019-06-01: 14 mL
  Filled 2019-06-01: qty 30

## 2019-06-01 MED ORDER — IOHEXOL 180 MG/ML  SOLN
10.0000 mL | Freq: Once | INTRAMUSCULAR | Status: AC
  Administered 2019-06-01: 10 mL via EPIDURAL
  Filled 2019-06-01: qty 20

## 2019-06-01 MED ORDER — LIDOCAINE HCL 2 % IJ SOLN
20.0000 mL | Freq: Once | INTRAMUSCULAR | Status: AC
  Administered 2019-06-01: 400 mg
  Filled 2019-06-01: qty 40

## 2019-06-01 MED ORDER — FENTANYL CITRATE (PF) 100 MCG/2ML IJ SOLN
25.0000 ug | INTRAMUSCULAR | Status: AC | PRN
  Administered 2019-06-01: 100 ug via INTRAVENOUS
  Filled 2019-06-01: qty 2

## 2019-06-01 NOTE — Patient Instructions (Signed)

## 2019-06-01 NOTE — Progress Notes (Signed)
PROVIDER NOTE: Information contained herein reflects review and annotations entered in association with encounter. Interpretation of such information and data should be left to medically-trained personnel. Information provided to patient can be located elsewhere in the medical record under "Patient Instructions". Document created using STT-dictation technology, any transcriptional errors that may result from process are unintentional.    Patient: James [name]  Service Category: Procedure  Provider: Gaspar Cola, MD  DOB: [birth year]  DOS: 06/01/2019  Location: [facility]  MRN: [id number]  Setting: Ambulatory - outpatient  Referring Provider: [name], MD  Type: Established Patient  Specialty: Interventional Pain Management  PCP: [name], MD   Primary Reason for Visit: Interventional Pain Management Treatment. CC: Abdominal Pain (chronic epigastric pain )  Procedure:          Anesthesia, Analgesia, Anxiolysis:  Type: Diagnostic Celiac Plexus Block #1  Region:Thoracolumbar Level: anterolateral aspect of T12-L1 (target); above the lateral tip of the L2 transverse process (skin puncture) Laterality: Bilateral Paramedial  Type: Moderate (Conscious) Sedation combined with Local Anesthesia Indication(s): Analgesia and Anxiety Route: Intravenous (IV) IV Access: Secured Sedation: Meaningful verbal contact was maintained at all times during the procedure  Local Anesthetic: Lidocaine 1-2%  Position: Prone with head of the table was raised to facilitate breathing.   Indications: 1. Chronic abdominal pain   2. Chronic epigastric pain (Primary Area of Pain)   3. Generalized abdominal pain   4. Malignant lymphoma, small lymphocytic ([place])   5. Chronic anticoagulation (Plavix)   6. Thrombocytopenia (HCC)    Pain Score: Pre-procedure: 5 /10 Post-procedure: 2 /10   Pre-op Assessment:  Mr. Lewman is a 69 y.o. (year old), male patient, seen today for  interventional treatment. He  has a past surgical history that includes Cholecystectomy (1983); Esophagogastroduodenoscopy (egd) with propofol (N/A, 11/20/2016); Cataract extraction w/ intraocular lens implant (Bilateral); Lower Extremity Angiography (Right, 05/19/2017); Esophagogastroduodenoscopy (egd) with propofol (N/A, 10/27/2017); Colonoscopy with propofol (N/A, 10/27/2017); and Lower Extremity Angiography (Left, 08/10/2018). Mr. Bonfanti has a current medication list which includes the following prescription(s): aspirin ec, atorvastatin, calcium plus d3 absorbable, vitamin d3, clopidogrel, ensure, ergocalciferol, folic acid, iron polysaccharides, lisinopril, metformin, and oxycodone-acetaminophen. His primarily concern today is the Abdominal Pain (chronic epigastric pain )  Initial Vital Signs:  Pulse/HCG Rate: 60ECG Heart Rate: (!) 57 Temp: 98.7 F (37.1 C) Resp: 16 BP: (!) 110/58 SpO2: 100 %  BMI: Estimated body mass index is 23.87 kg/m as calculated from the following:   Height as of this encounter: 5\' 8"  (1.727 m).   Weight as of this encounter: 157 lb (71.2 kg).  Risk Assessment: Allergies: Reviewed. He has No Known Allergies.  Allergy Precautions: None required Coagulopathies: Reviewed. None identified.  Blood-thinner therapy: None at this time Active Infection(s): Reviewed. None identified. Mr. Richter is afebrile  Site Confirmation: Mr. Gorniak was asked to confirm the procedure and laterality before marking the site Procedure checklist: Completed Consent: Before the procedure and under the influence of no sedative(s), amnesic(s), or anxiolytics, the patient was informed of the treatment options, risks and possible complications. To fulfill our ethical and legal obligations, as recommended by the American Medical Association's Code of Ethics, I have informed the patient of my clinical impression; the nature and purpose of the treatment or procedure; the risks, benefits, and possible  complications of the intervention; the alternatives, including doing nothing; the risk(s) and benefit(s) of the alternative treatment(s) or procedure(s); and the risk(s) and benefit(s) of doing nothing. The patient was provided  information about the general risks and possible complications associated with the procedure. These may include, but are not limited to: failure to achieve desired goals, infection, bleeding, organ or nerve damage, allergic reactions, paralysis, and death. In addition, the patient was informed of those risks and complications associated to Spine-related procedures, such as failure to decrease pain; infection (i.e.: Meningitis, epidural or intraspinal abscess); bleeding (i.e.: epidural hematoma, subarachnoid hemorrhage, or any other type of intraspinal or peri-dural bleeding); organ or nerve damage (i.e.: Any type of peripheral nerve, nerve root, or spinal cord injury) with subsequent damage to sensory, motor, and/or autonomic systems, resulting in permanent pain, numbness, and/or weakness of one or several areas of the body; allergic reactions; (i.e.: anaphylactic reaction); and/or death. Furthermore, the patient was informed of those risks and complications associated with the medications. These include, but are not limited to: allergic reactions (i.e.: anaphylactic or anaphylactoid reaction(s)); adrenal axis suppression; blood sugar elevation that in diabetics may result in ketoacidosis or comma; water retention that in patients with history of congestive heart failure may result in shortness of breath, pulmonary edema, and decompensation with resultant heart failure; weight gain; swelling or edema; medication-induced neural toxicity; particulate matter embolism and blood vessel occlusion with resultant organ, and/or nervous system infarction; and/or aseptic necrosis of one or more joints. Finally, the patient was informed that Medicine is not an exact science; therefore, there is also  the possibility of unforeseen or unpredictable risks and/or possible complications that may result in a catastrophic outcome. The patient indicated having understood very clearly. We have given the patient no guarantees and we have made no promises. Enough time was given to the patient to ask questions, all of which were answered to the patient's satisfaction. Mr. Marczak has indicated that he wanted to continue with the procedure. Attestation: I, the ordering provider, attest that I have discussed with the patient the benefits, risks, side-effects, alternatives, likelihood of achieving goals, and potential problems during recovery for the procedure that I have provided informed consent. Date  Time: 06/01/2019 12:41 PM  Pre-Procedure Preparation:  Monitoring: As per clinic protocol. Respiration, ETCO2, SpO2, BP, heart rate and rhythm monitor placed and checked for adequate function Safety Precautions: Patient was assessed for positional comfort and pressure points before starting the procedure. Time-out: I initiated and conducted the "Time-out" before starting the procedure, as per protocol. The patient was asked to participate by confirming the accuracy of the "Time Out" information. Verification of the correct person, site, and procedure were performed and confirmed by me, the nursing staff, and the patient. "Time-out" conducted as per Joint Commission's Universal Protocol (UP.01.01.01). Time: 1321  Description of Procedure:          Target Area: For the Celiac Plexus Block(s), the target is the area anterolateral to the Aorta at the level of T12-L1, with the splanchnic nerves crossing the diaphragmatic crux at about the T12 level. Approach: Paravertebral, percutaneous approach. Area Prepped: Entire Posterior Thoracolumbar Region Prepping solution: DuraPrep (Iodine Povacrylex [0.7% available iodine] and Isopropyl Alcohol, 74% w/w) Safety Precautions: Aspiration looking for blood return was conducted  prior to all injections. At no point did we inject any substances, as a needle was being advanced. No attempts were made at seeking any paresthesias. Safe injection practices and needle disposal techniques used. Medications properly checked for expiration dates. SDV (single dose vial) medications used. Description of the Procedure: Protocol guidelines were followed. The patient was placed in position over the procedure table. The target area was identified and  the area prepped in the usual manner. Skin & deeper tissues infiltrated with local anesthetic. Appropriate amount of time allowed to pass for local anesthetics to take effect. The procedure needles were introduced at the skin level of the L2 transverse process and advanced cephalad below the L1 transverse process, aiming towards the anterolateral aspect of the T12-L1 area, under pulsed fluoroscopic guidance. Care was taken not to advance the tip of the needle past the anterior border of the vertebral body, on the lateral fluoroscopic view. Proper needle placement secured. Negative aspiration confirmed. Solution injected in intermittent fashion, asking for systemic symptoms every 0.5cc of injectate. The needles were then removed and the area cleansed, making sure to leave some of the prepping solution back to take advantage of its long term bactericidal properties. Vitals:   06/01/19 1400 06/01/19 1410 06/01/19 1420 06/01/19 1430  BP: (!) 85/59 90/62 (!) 89/69 (!) 101/55  Pulse:      Resp: 16 12 14 15   Temp:      TempSrc:      SpO2: 98% 100% 100% 100%  Weight:      Height:        Start Time: 1321 hrs. End Time: 1341 hrs. Materials:  Needle(s) Type: Spinal Needle Gauge: 22G Length: 3.5-in Medication(s): Please see orders for medications and dosing details.  Imaging Guidance (Spinal):          Type of Imaging Technique: Fluoroscopy Guidance (Spinal) Indication(s): Assistance in needle guidance and placement for procedures requiring needle  placement in or near specific anatomical locations not easily accessible without such assistance. Exposure Time: Please see nurses notes. Contrast: Before injecting any contrast, we confirmed that the patient did not have an allergy to iodine, shellfish, or radiological contrast. Once satisfactory needle placement was completed at the desired level, radiological contrast was injected. Contrast injected under live fluoroscopy. No contrast complications. See chart for type and volume of contrast used. Fluoroscopic Guidance: I was personally present during the use of fluoroscopy. "Tunnel Vision Technique" used to obtain the best possible view of the target area. Parallax error corrected before commencing the procedure. "Direction-depth-direction" technique used to introduce the needle under continuous pulsed fluoroscopy. Once target was reached, antero-posterior, oblique, and lateral fluoroscopic projection used confirm needle placement in all planes. Images permanently stored in EMR.        Interpretation: I personally interpreted the imaging intraoperatively. Adequate needle placement confirmed in multiple planes. Appropriate spread of contrast into desired area was observed. No evidence of afferent or efferent intravascular uptake. No intrathecal or subarachnoid spread observed. Permanent images saved into the patient's record.  Antibiotic Prophylaxis:   Anti-infectives (From admission, onward)   None     Indication(s): None identified  Post-operative Assessment:  Post-procedure Vital Signs:  Pulse/HCG Rate: 60(!) 52 Temp: 98.7 F (37.1 C) Resp: 15 BP: (!) 101/55(Md aware- informed med pt to eat and drink H2O) SpO2: 100 %  EBL: None  Complications: No immediate post-treatment complications observed by team, or reported by patient.  The patient did experience some decrease in blood pressure and heart rate which we treated with glycopyrrolate and fluid.  Apparently the patient has not  been able to eat well secondary to the epigastric pain and therefore he seems to be dehydrated.  We make sure to rehydrate him and we reminded him of the possibility of loose stools as a side effect of the celiac plexus block.  He was instructed to drink as much fluid as he can and to eat.  Note: The patient tolerated the entire procedure well. A repeat set of vitals were taken after the procedure and the patient was kept under observation following institutional policy, for this type of procedure. Post-procedural neurological assessment was performed, showing return to baseline, prior to discharge. The patient was provided with post-procedure discharge instructions, including a section on how to identify potential problems. Should any problems arise concerning this procedure, the patient was given instructions to immediately contact us, at any time, without hesitation. In any case, we plan to contact the patient by telephone for a follow-up status report regarding this interventional procedure.  Comments:  No additional relevant information.  Plan of Care  Orders:  Orders Placed This Encounter  Procedures  . CELIAC PLEXUS BLOCK    CLINICAL INDICATIONS: Chronic Abdominal Pain    Scheduling Instructions:     PURPOSE: Diagnostic     LATERALITY: Bilateral     LEVEL(s): L1     SEDATION: Sedation recommended.     TIMEFRAME: Today    Order Specific Question:   Where will this procedure be performed?    Answer:   [facility]  . DG PAIN [facility] C-ARM 1-60 MIN NO REPORT    Intraoperative interpretation by procedural physician at [place].    Standing Status:   Standing    Number of Occurrences:   1    Order Specific Question:   Reason for exam:    Answer:   Assistance in needle guidance and placement for procedures requiring needle placement in or near specific anatomical locations not easily accessible without such assistance.  . Informed Consent Details:  Physician/Practitioner Attestation; Transcribe to consent form and obtain patient signature    Provider Attestation: I, [place] Dossie Arbour, MD, (Pain Management Specialist), the physician/practitioner, attest that I have discussed with the patient the benefits, risks, side effects, alternatives, likelihood of achieving goals and potential problems during recovery for the procedure that I have provided informed consent.    Scheduling Instructions:     Procedure: Diagnostic bilateral celiac plexus block under fluoroscopic guidance and IV sedation     Indication/Reason: Chronic abdominal pain     Note: Always confirm laterality of pain with Mr. Bilyk, before procedure.  . Care order/instruction: Please confirm that the patient has stopped the Plavix (Clopidogrel) x 7-10 days prior to procedure or surgery.    Please confirm that the patient has stopped the Plavix (Clopidogrel) x 7-10 days prior to procedure or surgery.    Standing Status:   Standing    Number of Occurrences:   1  . Provide equipment / supplies at bedside    Equipment required: Single use, disposable, "Block Tray"    Standing Status:   Standing    Number of Occurrences:   1    Order Specific Question:   Specify    Answer:   Block Tray  . Bleeding precautions    Standing Status:   Standing    Number of Occurrences:   1   Chronic Opioid Analgesic:  Oxycodone/APAP 5/325, 1 tab PO q 8 hrs (15 mg/day of oxycodone IR)  Highest recorded MME/day: 22.5 mg/day MME/day: 22.5 mg/day   Medications ordered for procedure: Meds ordered this encounter  Medications  . iohexol (OMNIPAQUE) 180 MG/ML injection 10 mL    Must be Myelogram-compatible. If not available, you may substitute with a water-soluble, non-ionic, hypoallergenic, myelogram-compatible radiological contrast medium.  Marland Kitchen lidocaine (XYLOCAINE) 2 % (with pres) injection 400 mg  . lactated ringers infusion  1,000 mL  . midazolam (VERSED) 5 MG/5ML injection 1-2 mg    Make sure  Flumazenil is available in the pyxis when using this medication. If oversedation occurs, administer 0.2 mg IV over 15 sec. If after 45 sec no response, administer 0.2 mg again over 1 min; may repeat at 1 min intervals; not to exceed 4 doses (1 mg)  . fentaNYL (SUBLIMAZE) injection 25-50 mcg    Make sure Narcan is available in the pyxis when using this medication. In the event of respiratory depression (RR< 8/min): Titrate NARCAN (naloxone) in increments of 0.1 to 0.2 mg IV at 2-3 minute intervals, until desired degree of reversal.  . dexamethasone (DECADRON) injection 10 mg  . bupivacaine-epinephrine (MARCAINE W/ EPI) 0.25% -1:200000 injection 14 mL  . glycopyrrolate (ROBINUL) injection 0.2 mg   Medications administered: We administered iohexol, lidocaine, lactated ringers, midazolam, fentaNYL, dexamethasone, bupivacaine-epinephrine, and glycopyrrolate.  See the medical record for exact dosing, route, and time of administration.  Follow-up plan:   Return in about 2 weeks (around 06/15/2019) for (VV), (PP).       Interventional treatment options: Planned, scheduled, and/or pending:    NOTE: PLAVIX ANTICOAGULATION  (Stop: 7 days  Restart: 2 hrs) Diagnostic bilateral celiac plexus block #1 under fluoroscopic guidance and IV sedation   Under consideration:   Diagnostic bilateral celiac plexus block #1 under fluoroscopic guidance and IV sedation   Therapeutic/palliative (PRN):   None at this time    Recent Visits Date Type Provider Dept  06/01/19 Procedure visit Milinda Pointer, MD [facility]  05/19/19 Telemedicine Milinda Pointer, [facility]  05/03/19 Office Visit Milinda Pointer, MD [facility]  Showing recent visits within past 90 days and meeting all other requirements   Future Appointments Date Type Provider Dept  06/15/19 Appointment Milinda Pointer, MD [facility]  Showing future appointments within next 90 days and  meeting all other requirements   Disposition: Discharge home  Discharge (Date  Time): 06/01/2019; 1444 hrs.   Primary Care Physician: [name], MD Location: [facility] Note by: Gaspar Cola, MD Date: 06/01/2019; Time: 4:12 AM  Disclaimer:  Medicine is not an Chief Strategy Officer. The only guarantee in medicine is that nothing is guaranteed. It is important to note that the decision to proceed with this intervention was based on the information collected from the patient. The Data and conclusions were drawn from the patient's questionnaire, the interview, and the physical examination. Because the information was provided in large part by the patient, it cannot be guaranteed that it has not been purposely or unconsciously manipulated. Every effort has been made to obtain as much relevant data as possible for this evaluation. It is important to note that the conclusions that lead to this procedure are derived in large part from the available data. Always take into account that the treatment will also be dependent on availability of resources and existing treatment guidelines, considered by other Pain Management Practitioners as being common knowledge and practice, at the time of the intervention. For Medico-Legal purposes, it is also important to point out that variation in procedural techniques and pharmacological choices are the acceptable norm. The indications, contraindications, technique, and results of the above procedure should only be interpreted and judged by a Board-Certified Interventional Pain Specialist with extensive familiarity and expertise in the same exact procedure and technique.

## 2019-06-02 ENCOUNTER — Other Ambulatory Visit: Payer: Self-pay

## 2019-06-02 ENCOUNTER — Telehealth: Payer: Self-pay | Admitting: Internal Medicine

## 2019-06-02 ENCOUNTER — Inpatient Hospital Stay: Payer: Medicare Other | Attending: Internal Medicine

## 2019-06-02 ENCOUNTER — Telehealth: Payer: Self-pay

## 2019-06-02 ENCOUNTER — Inpatient Hospital Stay: Payer: Medicare Other

## 2019-06-02 VITALS — BP 147/76 | HR 96

## 2019-06-02 DIAGNOSIS — D631 Anemia in chronic kidney disease: Secondary | ICD-10-CM | POA: Insufficient documentation

## 2019-06-02 DIAGNOSIS — N183 Chronic kidney disease, stage 3 unspecified: Secondary | ICD-10-CM

## 2019-06-02 DIAGNOSIS — E1122 Type 2 diabetes mellitus with diabetic chronic kidney disease: Secondary | ICD-10-CM

## 2019-06-02 DIAGNOSIS — E875 Hyperkalemia: Secondary | ICD-10-CM

## 2019-06-02 DIAGNOSIS — C9110 Chronic lymphocytic leukemia of B-cell type not having achieved remission: Secondary | ICD-10-CM

## 2019-06-02 LAB — CBC WITH DIFFERENTIAL/PLATELET
Abs Immature Granulocytes: 0.17 10*3/uL — ABNORMAL HIGH (ref 0.00–0.07)
Basophils Absolute: 0.1 10*3/uL (ref 0.0–0.1)
Basophils Relative: 0 %
Eosinophils Absolute: 0 10*3/uL (ref 0.0–0.5)
Eosinophils Relative: 0 %
HCT: 29.4 % — ABNORMAL LOW (ref 39.0–52.0)
Hemoglobin: 8.8 g/dL — ABNORMAL LOW (ref 13.0–17.0)
Immature Granulocytes: 1 %
Lymphocytes Relative: 62 %
Lymphs Abs: 22.4 10*3/uL — ABNORMAL HIGH (ref 0.7–4.0)
MCH: 32 pg (ref 26.0–34.0)
MCHC: 29.9 g/dL — ABNORMAL LOW (ref 30.0–36.0)
MCV: 106.9 fL — ABNORMAL HIGH (ref 80.0–100.0)
Monocytes Absolute: 3 10*3/uL — ABNORMAL HIGH (ref 0.1–1.0)
Monocytes Relative: 8 %
Neutro Abs: 10.2 10*3/uL — ABNORMAL HIGH (ref 1.7–7.7)
Neutrophils Relative %: 29 %
Platelets: 149 10*3/uL — ABNORMAL LOW (ref 150–400)
RBC: 2.75 MIL/uL — ABNORMAL LOW (ref 4.22–5.81)
RDW: 15.7 % — ABNORMAL HIGH (ref 11.5–15.5)
WBC: 35.9 10*3/uL — ABNORMAL HIGH (ref 4.0–10.5)
nRBC: 0.1 % (ref 0.0–0.2)

## 2019-06-02 LAB — BASIC METABOLIC PANEL
Anion gap: 7 (ref 5–15)
BUN: 54 mg/dL — ABNORMAL HIGH (ref 8–23)
CO2: 20 mmol/L — ABNORMAL LOW (ref 22–32)
Calcium: 9.6 mg/dL (ref 8.9–10.3)
Chloride: 110 mmol/L (ref 98–111)
Creatinine, Ser: 1.8 mg/dL — ABNORMAL HIGH (ref 0.61–1.24)
GFR calc Af Amer: 43 mL/min — ABNORMAL LOW (ref >60)
GFR calc non Af Amer: 37 mL/min — ABNORMAL LOW (ref >60)
Glucose, Bld: 166 mg/dL — ABNORMAL HIGH (ref 70–99)
Potassium: 6.5 mmol/L (ref 3.5–5.1)
Sodium: 137 mmol/L (ref 135–145)

## 2019-06-02 MED ORDER — SODIUM POLYSTYRENE SULFONATE 15 GM/60ML PO SUSP: 15.0000 g | Freq: Four times a day (QID) | ORAL | 0 refills | Status: DC

## 2019-06-02 MED ORDER — DARBEPOETIN ALFA 300 MCG/0.6ML IJ SOSY
300.0000 ug | PREFILLED_SYRINGE | Freq: Once | INTRAMUSCULAR | Status: AC
  Administered 2019-06-02: 12:00:00 300 ug via SUBCUTANEOUS
  Filled 2019-06-02: qty 0.6

## 2019-06-02 NOTE — Telephone Encounter (Signed)
H/T- please inform pt that his potassium is high at 6.5; and his kidney numbers are slightly worse.   Please ask him to STOP taking Lisinopril. Also- I will start pt on kayexalate/script sent.ask him to start taking today-ASAP.    Please inform pt that I will also inform Dr.Lateef- and check if he has any other recommendations.

## 2019-06-02 NOTE — Telephone Encounter (Signed)
Post procedure phone call. Patient states he is doing well.  

## 2019-06-02 NOTE — Telephone Encounter (Signed)
Please inform patient that I have spoken to Dr. Lajuana Ripple doctor.  Dr. Holley Raring recommends-patient to follow-up in his office tomorrow between 8:30- 12-for Lokelma samples from his office for his elevated potassium.   #C- Please schedule follow-up on 2/08-MD; CBC BMP; LDH; uric acid; mag; phos- Dr.B

## 2019-06-02 NOTE — Telephone Encounter (Signed)
I spoke with patient (via pt's cell #). (Pt was not at residence per wife.). pt given verbal instructions to stop the lisinopril and to start taking the kayexalate. Pt given instructions on kayexalate and he understands that the medication will cause him to have loose stools (which is the goal to help him eliminate the high potassium level). Teach back process performed with patient.  He understands that Dr. Jacinto Reap has left a msg for Dr. Holley Raring for recommendation regarding worsening renal function and hyperkalemia and Dr. B is waiting for nephrology's recommendation. We will contact the patient back as soon as Dr. B speaks to Dr. Holley Raring regarding the recommendations for rechecking pt's potassium.

## 2019-06-02 NOTE — Addendum Note (Signed)
Addended by: Maxwell Caul on: 06/02/2019 01:09 PM   Modules accepted: Orders

## 2019-06-02 NOTE — Telephone Encounter (Signed)
Spoke with patient. He gave verbal understanding of the plan of care. He states that he would come to Dr. Elwyn Lade office tomorrow around 9 am.  He would like Colette to call him back in about an hour as he is in the store. He believe that he and his wife [facility] an [facility] for the covid vaccine. He doesn't know what time that apt would be. He would be home within an hour. Colette - please call pt with new apt for 06/07/19

## 2019-06-02 NOTE — Telephone Encounter (Signed)
Apts given for patient for 2/8

## 2019-06-03 ENCOUNTER — Telehealth: Payer: Self-pay | Admitting: *Deleted

## 2019-06-03 ENCOUNTER — Inpatient Hospital Stay: Payer: Medicare Other

## 2019-06-03 DIAGNOSIS — C9110 Chronic lymphocytic leukemia of B-cell type not having achieved remission: Secondary | ICD-10-CM

## 2019-06-03 DIAGNOSIS — E875 Hyperkalemia: Secondary | ICD-10-CM

## 2019-06-03 DIAGNOSIS — N183 Chronic kidney disease, stage 3 unspecified: Secondary | ICD-10-CM

## 2019-06-03 DIAGNOSIS — D631 Anemia in chronic kidney disease: Secondary | ICD-10-CM

## 2019-06-03 DIAGNOSIS — E1122 Type 2 diabetes mellitus with diabetic chronic kidney disease: Secondary | ICD-10-CM

## 2019-06-03 LAB — CBC WITH DIFFERENTIAL/PLATELET
Abs Immature Granulocytes: 0.15 10*3/uL — ABNORMAL HIGH (ref 0.00–0.07)
Basophils Absolute: 0.1 10*3/uL (ref 0.0–0.1)
Basophils Relative: 0 %
Eosinophils Absolute: 0 10*3/uL (ref 0.0–0.5)
Eosinophils Relative: 0 %
HCT: 27.8 % — ABNORMAL LOW (ref 39.0–52.0)
Hemoglobin: 8.7 g/dL — ABNORMAL LOW (ref 13.0–17.0)
Immature Granulocytes: 1 %
Lymphocytes Relative: 69 %
Lymphs Abs: 21.4 10*3/uL — ABNORMAL HIGH (ref 0.7–4.0)
MCH: 33 pg (ref 26.0–34.0)
MCHC: 31.3 g/dL (ref 30.0–36.0)
MCV: 105.3 fL — ABNORMAL HIGH (ref 80.0–100.0)
Monocytes Absolute: 2 10*3/uL — ABNORMAL HIGH (ref 0.1–1.0)
Monocytes Relative: 6 %
Neutro Abs: 7.4 10*3/uL (ref 1.7–7.7)
Neutrophils Relative %: 24 %
Platelets: 132 10*3/uL — ABNORMAL LOW (ref 150–400)
RBC: 2.64 MIL/uL — ABNORMAL LOW (ref 4.22–5.81)
RDW: 16.2 % — ABNORMAL HIGH (ref 11.5–15.5)
WBC: 31.8 10*3/uL — ABNORMAL HIGH (ref 4.0–10.5)
nRBC: 0 % (ref 0.0–0.2)

## 2019-06-03 LAB — BASIC METABOLIC PANEL
Anion gap: 8 (ref 5–15)
BUN: 40 mg/dL — ABNORMAL HIGH (ref 8–23)
CO2: 25 mmol/L (ref 22–32)
Calcium: 9.1 mg/dL (ref 8.9–10.3)
Chloride: 106 mmol/L (ref 98–111)
Creatinine, Ser: 1.45 mg/dL — ABNORMAL HIGH (ref 0.61–1.24)
GFR calc Af Amer: 56 mL/min — ABNORMAL LOW (ref >60)
GFR calc non Af Amer: 48 mL/min — ABNORMAL LOW (ref >60)
Glucose, Bld: 116 mg/dL — ABNORMAL HIGH (ref 70–99)
Potassium: 4.4 mmol/L (ref 3.5–5.1)
Sodium: 139 mmol/L (ref 135–145)

## 2019-06-03 LAB — PHOSPHORUS: Phosphorus: 4.4 mg/dL (ref 2.5–4.6)

## 2019-06-03 LAB — LACTATE DEHYDROGENASE: LDH: 140 U/L (ref 98–192)

## 2019-06-03 LAB — HEPATITIS B SURFACE ANTIGEN: Hepatitis B Surface Ag: NONREACTIVE

## 2019-06-03 LAB — MAGNESIUM: Magnesium: 2.4 mg/dL (ref 1.7–2.4)

## 2019-06-03 LAB — URIC ACID: Uric Acid, Serum: 6.8 mg/dL (ref 3.7–8.6)

## 2019-06-03 NOTE — Telephone Encounter (Signed)
Patient called. He states that he went to the nephrologist in [place] and they did not have any record of patient having an apt today at Dr. Elwyn Lade office. Patient states that the [place] office sent patient to the [facility] office to see Dr. Holley Raring. He states that when he went to the [facility] office they told him he "had labs to be drawn on Monday for Dr. B so they drew his labs for Dr. Jacinto Reap. He had these labs drawn in [facility] per patient. Patient stated that "I just don't think I was at the right place to have my labs drawn and that is why I am calling you."  I made Dr. Rogue Bussing aware that labs for Monday 2/8 were actually drawn today. Dr. B aware that patient never saw Dr. Holley Raring today or had labs drawn at nephrology office.  Per patient - he never had a loose stool following the kayexlate dosing x 2 yesterday.  Md made aware.  Potassium is improved at 4.4. creatinine level is 1.45 today.  Per Dr. Rogue Bussing - v/o to add metb for Monday and r/s the patient for a lab apt for Monday 2/8

## 2019-06-04 ENCOUNTER — Encounter (INDEPENDENT_AMBULATORY_CARE_PROVIDER_SITE_OTHER): Payer: Medicare Other

## 2019-06-04 ENCOUNTER — Other Ambulatory Visit: Payer: Self-pay

## 2019-06-04 ENCOUNTER — Ambulatory Visit (INDEPENDENT_AMBULATORY_CARE_PROVIDER_SITE_OTHER): Payer: Medicare Other | Admitting: Vascular Surgery

## 2019-06-04 LAB — HEPATITIS C ANTIBODY: HCV Ab: NONREACTIVE

## 2019-06-04 LAB — HEPATITIS B CORE ANTIBODY, IGM: Hep B C IgM: NONREACTIVE

## 2019-06-07 ENCOUNTER — Ambulatory Visit: Payer: Medicare Other | Attending: Internal Medicine

## 2019-06-07 ENCOUNTER — Inpatient Hospital Stay (HOSPITAL_BASED_OUTPATIENT_CLINIC_OR_DEPARTMENT_OTHER): Payer: Medicare Other | Admitting: Internal Medicine

## 2019-06-07 ENCOUNTER — Other Ambulatory Visit: Payer: Self-pay

## 2019-06-07 ENCOUNTER — Other Ambulatory Visit: Payer: Medicare Other

## 2019-06-07 ENCOUNTER — Telehealth: Payer: Self-pay | Admitting: *Deleted

## 2019-06-07 ENCOUNTER — Inpatient Hospital Stay: Payer: Medicare Other

## 2019-06-07 DIAGNOSIS — N183 Chronic kidney disease, stage 3 unspecified: Secondary | ICD-10-CM | POA: Diagnosis not present

## 2019-06-07 DIAGNOSIS — C9110 Chronic lymphocytic leukemia of B-cell type not having achieved remission: Secondary | ICD-10-CM

## 2019-06-07 DIAGNOSIS — Z23 Encounter for immunization: Secondary | ICD-10-CM | POA: Insufficient documentation

## 2019-06-07 DIAGNOSIS — E875 Hyperkalemia: Secondary | ICD-10-CM

## 2019-06-07 DIAGNOSIS — D631 Anemia in chronic kidney disease: Secondary | ICD-10-CM

## 2019-06-07 LAB — BASIC METABOLIC PANEL
Anion gap: 11 (ref 5–15)
BUN: 30 mg/dL — ABNORMAL HIGH (ref 8–23)
CO2: 22 mmol/L (ref 22–32)
Calcium: 9.2 mg/dL (ref 8.9–10.3)
Chloride: 105 mmol/L (ref 98–111)
Creatinine, Ser: 1.64 mg/dL — ABNORMAL HIGH (ref 0.61–1.24)
GFR calc Af Amer: 48 mL/min — ABNORMAL LOW (ref >60)
GFR calc non Af Amer: 42 mL/min — ABNORMAL LOW (ref >60)
Glucose, Bld: 157 mg/dL — ABNORMAL HIGH (ref 70–99)
Potassium: 5.1 mmol/L (ref 3.5–5.1)
Sodium: 138 mmol/L (ref 135–145)

## 2019-06-07 LAB — CBC WITH DIFFERENTIAL/PLATELET
Abs Immature Granulocytes: 0 10*3/uL (ref 0.00–0.07)
Band Neutrophils: 1 %
Basophils Absolute: 0.3 10*3/uL — ABNORMAL HIGH (ref 0.0–0.1)
Basophils Relative: 1 %
Eosinophils Absolute: 1 10*3/uL — ABNORMAL HIGH (ref 0.0–0.5)
Eosinophils Relative: 3 %
HCT: 30.1 % — ABNORMAL LOW (ref 39.0–52.0)
Hemoglobin: 8.9 g/dL — ABNORMAL LOW (ref 13.0–17.0)
Lymphocytes Relative: 80 %
Lymphs Abs: 25.4 10*3/uL — ABNORMAL HIGH (ref 0.7–4.0)
MCH: 32 pg (ref 26.0–34.0)
MCHC: 29.6 g/dL — ABNORMAL LOW (ref 30.0–36.0)
MCV: 108.3 fL — ABNORMAL HIGH (ref 80.0–100.0)
Monocytes Absolute: 1.3 10*3/uL — ABNORMAL HIGH (ref 0.1–1.0)
Monocytes Relative: 4 %
Neutro Abs: 3.8 10*3/uL (ref 1.7–7.7)
Neutrophils Relative %: 11 %
Platelets: 162 10*3/uL (ref 150–400)
RBC: 2.78 MIL/uL — ABNORMAL LOW (ref 4.22–5.81)
RDW: 15.9 % — ABNORMAL HIGH (ref 11.5–15.5)
Smear Review: NORMAL
WBC Morphology: ABNORMAL
WBC: 31.7 10*3/uL — ABNORMAL HIGH (ref 4.0–10.5)
nRBC: 0.1 % (ref 0.0–0.2)

## 2019-06-07 MED ORDER — ACYCLOVIR 400 MG PO TABS: ORAL_TABLET | ORAL | 3 refills | Status: DC

## 2019-06-07 MED ORDER — MONTELUKAST SODIUM 10 MG PO TABS: 10.0000 mg | ORAL_TABLET | Freq: Every day | ORAL | 0 refills | Status: DC

## 2019-06-07 MED ORDER — DARBEPOETIN ALFA 300 MCG/0.6ML IJ SOSY
300.0000 ug | PREFILLED_SYRINGE | Freq: Once | INTRAMUSCULAR | Status: AC
  Administered 2019-06-07: 300 ug via SUBCUTANEOUS
  Filled 2019-06-07: qty 0.6

## 2019-06-07 MED ORDER — DEXAMETHASONE 4 MG PO TABS: ORAL_TABLET | ORAL | 3 refills | Status: DC

## 2019-06-07 MED ORDER — SODIUM CHLORIDE 0.9 % IV SOLN
Freq: Once | INTRAVENOUS | Status: DC
  Filled 2019-06-07: qty 250

## 2019-06-07 NOTE — Patient Instructions (Addendum)
#   Take pre-medications as recommended prior to next treatment/chemo infusion.   # Take tylenol 325 mg twice a day; Take claritin one a day; start 2 days prior to treatement x 4 days; take along with other prescriptions as recommended;  called to pharmacy  *you will follow the same instructions for ALL SCHEDULED INFUSIONS!    2/20/202- Saturday  Take  tylenol 325 mg 1 TABLET TWICE DAILY  Take Claritin 10 mg once daily- CAN PICK UP OVER THE COUNTER  montelukast (SINGULAIR) 10 MG tablet take daily for a total of 4 days  Dr. B sent this script to the pharmacy  dexamethasone (DECADRON) 4 MG tablet take daily for 2 days prior to your infusion  Dr. B sent this script to the pharmacy  **THIS IS THE STEROID MEDICATION  2/21/202- Sunday  Take  tylenol 325 mg twice a day  Take Claritin 10 mg once daily  montelukast (SINGULAIR) 10 MG tablet take daily for a total of 4 days * Dr. B sent this script to the pharmacy  dexamethasone (DECADRON) 4 MG tablet take daily for 2 days prior to your infusion **Dr. B sent this script to the pharmacy **[place]  06/21/2019-- Monday The day of your infusion  Take  tylenol 325 mg twice a day  Take Claritin 10 mg once daily  montelukast (SINGULAIR) 10 MG tablet take daily for a total of 4 days * Dr. B sent this script to the pharmacy   Do not take steriods on the day of infusion    06/22/2019-- Tuesday The day of your infusion  Take  tylenol 325 mg twice a day  Take Claritin 10 mg once daily  montelukast (SINGULAIR) 10 MG tablet take daily for a total of 4 days ** Dr. B sent this script to the pharmacy   Do not take steriods on the day of infusion   TAKE acyclovir (ZOVIRAX) 400 MG tablet everyday - This is the medication for shingle prevention!

## 2019-06-07 NOTE — Progress Notes (Signed)
   Covid-19 Vaccination Clinic  Name:  James [name]    MRN: [id number] DOB: 05/17/1950  06/07/2019  [name] was observed post Covid-19 immunization for 30 minutes without incidence. He was provided with Vaccine Information Sheet and instruction to access the V-Safe system.   [name] was instructed to call 911 with any severe reactions post vaccine: Marland Kitchen Difficulty breathing  . Swelling of your face and throat  . A fast heartbeat  . A bad rash all over your body  . Dizziness and weakness    Immunizations Administered    Name Date Dose VIS Date Route   Moderna COVID-19 Vaccine 06/07/2019 11:24 AM 0.5 mL 03/30/2019 Intramuscular   Manufacturer: Moderna   Lot: [id number]   [place]: 70658-260-88

## 2019-06-07 NOTE — Progress Notes (Signed)
Should there [facility] OFFICE PROGRESS NOTE  Patient Care Team: [name], MD as PCP - General (Internal Medicine)  Cancer Staging No matching staging information was found for the patient.   Oncology History Overview Note  # 2006- CLL STAGE IV; MAY 2011- WBC- 57K;Platelets-99;Hb-12/CT Bulky LN; BMBx- 80% Invol; del 11; START Benda-Ritux x4 [finished Sep 2011];   # July 2015-Progression; Sep 2015-START ibrutinib; CT scan DEC 2015- Improvement LN; Cont Ibrutinib 160m/d; NOV 2016 CT- 1-2CM LN [mild progression compared to Dec 2015];NOV 2016- FISH peripheral blood- NO MUTATIONS/CD-38 Positive; NOV 7th- CONT IBRUTINIB 2 pills/day; CT AUG 2017- STABLE;  DEC 6th PET- Mild RP LN/ Retrocrural LN  # OFF ibrutinib [? intol]- sep 2019- Jan 16th 2020; Re-start Ibrutinib; September 2020-stop ibrutinib [poor tolerance/worsening anemia]  # Jan 16th 2020- start aranesp  # DEC 2019- PAIN CONTRACT  # PALLIATIVE CARE- O  #October 2019-bone marrow biopsy [worsening anemia]-question dyserythropoietic changes/small clone of CLL;  # [facility]- NEG.  SA skin infection [Oct 2016] s/p clinda -------------------------------------------------------    DIAGNOSIS: CLL  STAGE:   IV      ;GOALS: palliative  CURRENT/MOST RECENT THERAPY : Ibrutinib.    CLL (chronic lymphocytic leukemia) (HCC)    INTERVAL HISTORY:  James [name] 69y.o.  male pleasant patient above history of CLL [currently off ibrutinib] and chronic abdominal pain of unclear etiology; worsening anemia of unclear etiology/? CKD/hyperkalemia is here for follow-up.  The interim patient was noted to have elevated potassium of 6.5/creatinine worsening at 1.8.  I spoke to Dr. Lateef-recommended further labs; potassium lowering agent.  Unfortunately there was a mixup patient-had repeat labs with uKoreapotassium improved at 4.5.  Patient took Kayexalate without any loose stools.  Patient continues to feel overall  poorly.  Chronic abdominal pain.  He had a serious plexus block.   Review of Systems  Constitutional: Positive for malaise/fatigue and weight loss. Negative for chills, diaphoresis and fever.  HENT: Negative for nosebleeds and sore throat.   Eyes: Negative for double vision.  Respiratory: Negative for cough, hemoptysis, sputum production, shortness of breath and wheezing.   Cardiovascular: Negative for chest pain, palpitations, orthopnea and leg swelling.  Gastrointestinal: Positive for abdominal pain, constipation and nausea. Negative for blood in stool, diarrhea, heartburn, melena and vomiting.  Genitourinary: Negative for dysuria, frequency and urgency.  Skin: Negative.  Negative for itching and rash.  Neurological: Negative for dizziness, tingling, focal weakness, weakness and headaches.  Endo/Heme/Allergies: Does not bruise/bleed easily.  Psychiatric/Behavioral: Negative for depression. The patient is not nervous/anxious and does not have insomnia.       PAST MEDICAL HISTORY :  Past Medical History:  Diagnosis Date  . C[place] ([place]ocytic leukemia) ([facility]   . Depression   . Diabetes mellitus without complication ([facility]   . Hematuria   . Hyperlipidemia   . Hypertension     PAST SURGICAL HISTORY :   Past Surgical History:  Procedure Laterality Date  . CATARACT EXTRACTION W/ INTRAOCULAR LENS IMPLANT Bilateral   . CHOLECYSTECTOMY  1983  . COLONOSCOPY WITH PROPOFOL N/A 10/27/2017   Procedure: COLONOSCOPY WITH PROPOFOL;  Surgeon: EManya Silvas MD;  [facility];  Service: Endoscopy;  Laterality: N/A;  . ESOPHAGOGASTRODUODENOSCOPY (EGD) WITH PROPOFOL N/A 11/20/2016   Procedure: ESOPHAGOGASTRODUODENOSCOPY (EGD) WITH PROPOFOL;  Surgeon: EManya Silvas MD;  [facility];  Service: Endoscopy;  Laterality: N/A;  . ESOPHAGOGASTRODUODENOSCOPY (EGD) WITH PROPOFOL N/A 10/27/2017   Procedure: ESOPHAGOGASTRODUODENOSCOPY (EGD) WITH  PROPOFOL;  Surgeon: Manya Silvas, MD;  [facility];  Service: Endoscopy;  Laterality: N/A;  . LOWER EXTREMITY ANGIOGRAPHY Right 05/19/2017   Procedure: LOWER EXTREMITY ANGIOGRAPHY;  Surgeon: Algernon Huxley, MD;  Location: [facility];  Service: Cardiovascular;  Laterality: Right;  . LOWER EXTREMITY ANGIOGRAPHY Left 08/10/2018   Procedure: LOWER EXTREMITY ANGIOGRAPHY;  Surgeon: Algernon Huxley, MD;  Location: [place] [facility];  Service: Cardiovascular;  Laterality: Left;    FAMILY HISTORY :   Family History  Problem Relation Age of Onset  . Hypertension Sister     SOCIAL HISTORY:   Social History   Tobacco Use  . Smoking status: Current Every Day Smoker    Packs/day: 0.50    Years: 47.00    Pack years: 23.50    Types: Cigarettes  . Smokeless tobacco: Never Used  Substance Use Topics  . Alcohol use: Yes    Alcohol/week: 1.0 standard drinks    Types: 1 Glasses of wine per week    Comment:  almost none in last 6 months  . Drug use: No    ALLERGIES:  has No Known Allergies.  MEDICATIONS:  Current Outpatient Medications  Medication Sig Dispense Refill  . aspirin EC 81 MG tablet Take 81 mg by mouth daily.    Marland Kitchen atorvastatin (LIPITOR) 10 MG tablet Take 1 tablet (10 mg total) by mouth daily. 30 tablet 11  . Calcium Carb-Cholecalciferol (CALCIUM PLUS D3 ABSORBABLE) [phone] MG-UNIT CAPS Take 1 capsule by mouth 2 (two) times daily with a meal. 60 capsule 5  . Cholecalciferol (VITAMIN D3) 125 MCG (5000 UT) CAPS Take 1 capsule (5,000 Units total) by mouth daily with breakfast. Take along with calcium and magnesium. 30 capsule 5  . clopidogrel (PLAVIX) 75 MG tablet Take 75 mg by mouth daily.    . Ensure (ENSURE) Take 237 mLs by mouth.    . ergocalciferol (VITAMIN D2) 1.25 MG (50000 UT) capsule Take 1 capsule (50,000 Units total) by mouth 2 (two) times a week. X 6 weeks. 12 capsule 0  . folic acid (FOLVITE) 1 MG tablet Take 1 tablet (1 mg total) by mouth daily. 30 tablet 1  . iron  polysaccharides (NU-IRON) 150 MG capsule Take 150 mg by mouth daily.     Marland Kitchen lisinopril (ZESTRIL) 10 MG tablet Take 1 tablet (10 mg total) by mouth daily. 30 tablet 0  . metFORMIN (GLUCOPHAGE) 1000 MG tablet Take 1,000 mg by mouth daily with breakfast.     . oxyCODONE-acetaminophen (PERCOCET/ROXICET) 5-325 MG tablet 1 pill every 8-12 hours 90 tablet 0  . acyclovir (ZOVIRAX) 400 MG tablet One pill a day [to prevent shingles] 30 tablet 3  . dexamethasone (DECADRON) 4 MG tablet Start 2 days prior to infusion; Take for 2 days. Do not take on the day of infusion. 60 tablet 3  . montelukast (SINGULAIR) 10 MG tablet Take 1 tablet (10 mg total) by mouth at bedtime. Start 2 days prior to infusion. Take it for 4 days. 60 tablet 0  . sodium polystyrene (KAYEXALATE) 15 GM/60ML suspension Take 60 mLs (15 g total) by mouth every 6 (six) hours. Until 2 loose stools 240 mL 0   No current facility-administered medications for this visit.    PHYSICAL EXAMINATION: ECOG PERFORMANCE STATUS: 1 - Symptomatic but completely ambulatory  BP (!) 142/90 (Patient Position: Sitting)   Pulse (!) 116   Temp 98.1 F (36.7 C) (Tympanic)   Resp 20   Ht 5'  8" (1.727 m)   Wt 157 lb (71.2 kg)   BMI 23.87 kg/m   Filed Weights   06/07/19 0849  Weight: 157 lb (71.2 kg)    Physical Exam  Constitutional: He is oriented to person, place, and time and well-developed, well-nourished, and in no distress.  He is alone.  Appears pale.  HENT:  Head: Normocephalic and atraumatic.  Mouth/Throat: Oropharynx is clear and moist. No oropharyngeal exudate.  Eyes: Pupils are equal, round, and reactive to light.  Cardiovascular: Normal rate and regular rhythm.  Pulmonary/Chest: No respiratory distress. He has no wheezes.  Decreased air entry bil.   Abdominal: Soft. Bowel sounds are normal. He exhibits no distension and no mass. There is no abdominal tenderness. There is no rebound and no guarding.  Musculoskeletal:        General: No  tenderness or edema. Normal range of motion.     Cervical back: Normal range of motion and neck supple.  Neurological: He is alert and oriented to person, place, and time.  Skin: Skin is warm. There is pallor.  Multiple bruises noted in bilateral upper extremity.  Psychiatric: Affect normal.      LABORATORY DATA:  I have reviewed the data as listed    Component Value Date/Time   NA 138 06/07/2019 0840   NA 142 05/03/2014 1139   K 5.1 06/07/2019 0840   K 4.7 05/03/2014 1139   CL 105 06/07/2019 0840   CL 110 (H) 05/03/2014 1139   CO2 22 06/07/2019 0840   CO2 24 05/03/2014 1139   GLUCOSE 157 (H) 06/07/2019 0840   GLUCOSE 98 05/03/2014 1139   BUN 30 (H) 06/07/2019 0840   BUN 20 (H) 05/03/2014 1139   CREATININE 1.64 (H) 06/07/2019 0840   CREATININE 1.22 08/09/2014 1122   CALCIUM 9.2 06/07/2019 0840   CALCIUM 8.6 05/03/2014 1139   PROT 7.4 05/11/2019 1117   PROT 7.5 05/03/2014 1139   ALBUMIN 4.6 05/11/2019 1117   ALBUMIN 4.1 05/03/2014 1139   AST 12 (L) 05/11/2019 1117   AST 15 05/03/2014 1139   ALT 12 05/11/2019 1117   ALT 26 05/03/2014 1139   ALKPHOS 60 05/11/2019 1117   ALKPHOS 94 05/03/2014 1139   BILITOT 0.5 05/11/2019 1117   BILITOT 0.5 05/03/2014 1139   GFRNONAA 42 (L) 06/07/2019 0840   GFRNONAA >60 08/09/2014 1122   GFRAA 48 (L) 06/07/2019 0840   GFRAA >60 08/09/2014 1122    No results found for: SPEP, UPEP  Lab Results  Component Value Date   WBC 31.7 (H) 06/07/2019   NEUTROABS 3.8 06/07/2019   HGB 8.9 (L) 06/07/2019   HCT 30.1 (L) 06/07/2019   MCV 108.3 (H) 06/07/2019   PLT 162 06/07/2019      Chemistry      Component Value Date/Time   NA 138 06/07/2019 0840   NA 142 05/03/2014 1139   K 5.1 06/07/2019 0840   K 4.7 05/03/2014 1139   CL 105 06/07/2019 0840   CL 110 (H) 05/03/2014 1139   CO2 22 06/07/2019 0840   CO2 24 05/03/2014 1139   BUN 30 (H) 06/07/2019 0840   BUN 20 (H) 05/03/2014 1139   CREATININE 1.64 (H) 06/07/2019 0840   CREATININE  1.22 08/09/2014 1122      Component Value Date/Time   CALCIUM 9.2 06/07/2019 0840   CALCIUM 8.6 05/03/2014 1139   ALKPHOS 60 05/11/2019 1117   ALKPHOS 94 05/03/2014 1139   AST 12 (L) 05/11/2019 1117  AST 15 05/03/2014 1139   ALT 12 05/11/2019 1117   ALT 26 05/03/2014 1139   BILITOT 0.5 05/11/2019 1117   BILITOT 0.5 05/03/2014 1139       RADIOGRAPHIC STUDIES: I have personally reviewed the radiological images as listed and agreed with the findings in the report. No results found.   ASSESSMENT & PLAN:  CLL (chronic lymphocytic leukemia) ([place]) # CLL/SLL- relapsed most recently on ibrutinib; July 2020- CT Ab/Pelvis- CT scan shows no progressive/worsening lymphadenopathy.  Currently off ibrutinib because of progressive anemia [see discussion below].  However white count-30- lymphocytosis rising-concerning for recurrence/progression.  Recommend CT scan chest and pelvis noncontrast  #Had a long discussion with the patient regarding the concerns of progressive disease needing to switching therapy to [facility].  Discussed the potential risk of infusion reactions/side effects including but not limited to increased risk of infections/low blood counts etc.  Also discussed at length regarding-premedications-Tylenol/Claritin; Singulair/dexamethasone prior.  #Anemia-hemoglobin 8.6-CKD versus progressive leukemia.  Continue Aranesp.  See plan above  #CKD-III-Hyperkalemia potassium- labile; discussed with Dr.Lateef.  Today potassium 5.1;   # Abdominal pain/dyspepsia/unclear etiology STABLE;  Percocet for now.  S/p celiac plexus block.  #Patient initially agreed to [facility] treatment; but then stated that he is not ready to go through treatments.  Stated he is tired of work-up/treatments-and wants to take a more conservative approach.   # Palliative care evaluation: Introduced palliative care philosophy and services. I discussed the need for palliative care evaluation/symptom management to help quality  of life in the context of incurable disease.  Patient is interested; will make referral.  # DISPOSITION: #  Aranesp today; # referral to pallaitive care in 2 weeks0- # in 2 weeks-MD-- lab- cbc/bmp- Aranesp; CT scans prior- Dr.B    Orders Placed This Encounter  Procedures  . CT Abdomen Pelvis Wo Contrast    Standing Status:   Future    Standing Expiration Date:   06/06/2020    Order Specific Question:   ** REASON FOR EXAM (FREE TEXT)    Answer:   CLL    Order Specific Question:   Preferred imaging location?    Answer:   [facility]    Order Specific Question:   Is Oral Contrast requested for this exam?    Answer:   Yes, Per Radiology protocol    Order Specific Question:   Radiology Contrast Protocol - do NOT remove file path    Answer:   \\[email or web address]  . CT CHEST WO CONTRAST    Standing Status:   Future    Standing Expiration Date:   06/06/2020    Order Specific Question:   Preferred imaging location?    Answer:   [facility]    Order Specific Question:   Radiology Contrast Protocol - do NOT remove file path    Answer:   \\[email or web address]    Order Specific Question:   ** REASON FOR EXAM (FREE TEXT)    Answer:   CLL   All questions were answered. The patient knows to call the clinic with any problems, questions or concerns.      Cammie Sickle, MD 06/07/2019 1:08 PM

## 2019-06-07 NOTE — Progress Notes (Signed)
Patient educated on premedications for gazva. However, he asked to speak to the provider again as he was now declining this treatment option. He would like to wait for a CT scan results to determine next steps. Dr. B made aware and spoke to patient regarding pt's decision.

## 2019-06-07 NOTE — Telephone Encounter (Signed)
Patient called and would like to further discuss procedures discussed at todays appointment, he has questions. Please return his call [phone]

## 2019-06-07 NOTE — Assessment & Plan Note (Addendum)
#   CLL/SLL- relapsed most recently on ibrutinib; July 2020- CT Ab/Pelvis- CT scan shows no progressive/worsening lymphadenopathy.  Currently off ibrutinib because of progressive anemia [see discussion below].  However white count-30- lymphocytosis rising-concerning for recurrence/progression.  Recommend CT scan chest and pelvis noncontrast  #Had a long discussion with the patient regarding the concerns of progressive disease needing to switching therapy to [facility].  Treatments are monthly IV infusions; long hours of infusion.  First month would be more frequent.  Treatments are palliative.  Discussed the potential risk of infusion reactions/side effects including but not limited to increased risk of infections/low blood counts etc.  Also discussed at length regarding-premedications-Tylenol/Claritin; Singulair/dexamethasone prior.  #Anemia-hemoglobin 8.6-CKD versus progressive leukemia.  Continue Aranesp.  See plan above  #CKD-III-Hyperkalemia potassium- labile; discussed with Dr.Lateef.  Today potassium 5.1;   # Abdominal pain/dyspepsia/unclear etiology STABLE;  Percocet for now.  S/p celiac plexus block.  #Patient initially agreed to [facility] treatment; but then stated that he is not ready to go through treatments.  Stated he is tired of work-up/treatments-and wants to take a more conservative approach.   # Palliative care evaluation: Introduced palliative care philosophy and services. I discussed the need for palliative care evaluation/symptom management to help quality of life in the context of incurable disease.  Patient is interested; will make referral.  # DISPOSITION: #  Aranesp today; # referral to pallaitive care in 2 weeks0- # in 2 weeks-MD-- lab- cbc/bmp- Aranesp; CT scans prior- Dr.B

## 2019-06-08 ENCOUNTER — Ambulatory Visit (INDEPENDENT_AMBULATORY_CARE_PROVIDER_SITE_OTHER): Payer: Medicare Other | Admitting: Vascular Surgery

## 2019-06-08 ENCOUNTER — Telehealth: Payer: Self-pay | Admitting: Internal Medicine

## 2019-06-08 ENCOUNTER — Encounter (INDEPENDENT_AMBULATORY_CARE_PROVIDER_SITE_OTHER): Payer: Medicare Other

## 2019-06-08 NOTE — Telephone Encounter (Signed)
Done

## 2019-06-08 NOTE — Telephone Encounter (Signed)
Dr. Jacinto Reap - please return patient's phone call.

## 2019-06-08 NOTE — Telephone Encounter (Signed)
On 2/8- spoke to pt re: his concerns of proceeding with treatment. Wants to wait until further imaging to decide on treatment.  GB

## 2019-06-14 ENCOUNTER — Encounter: Payer: Self-pay | Admitting: Pain Medicine

## 2019-06-14 ENCOUNTER — Telehealth: Payer: Medicare Other | Admitting: Pain Medicine

## 2019-06-14 ENCOUNTER — Other Ambulatory Visit: Payer: Self-pay | Admitting: *Deleted

## 2019-06-14 ENCOUNTER — Telehealth: Payer: Self-pay

## 2019-06-14 DIAGNOSIS — C8300 Small cell B-cell lymphoma, unspecified site: Secondary | ICD-10-CM

## 2019-06-14 DIAGNOSIS — C9192 Lymphoid leukemia, unspecified, in relapse: Secondary | ICD-10-CM

## 2019-06-14 DIAGNOSIS — C9112 Chronic lymphocytic leukemia of B-cell type in relapse: Secondary | ICD-10-CM

## 2019-06-14 MED ORDER — OXYCODONE-ACETAMINOPHEN 5-325 MG PO TABS: ORAL_TABLET | ORAL | 0 refills | Status: DC

## 2019-06-14 NOTE — Telephone Encounter (Signed)
Pt was called and message left on answering service.

## 2019-06-14 NOTE — Progress Notes (Signed)
Pain relief after procedure (treated area only): (Questions asked to patient) 1. Starting about 15 minutes after the procedure, and "while the area was still numb" (from the local anesthetics), were you having any of your usual pain "in that area" (the treated area)?  (NOTE: NOT including the discomfort from the needle sticks.) First 1 hour: 30 % better. First 4-6 hours: 30 % better. 2. How long did the numbness from the local anesthetics last? (More than 6 hours?) Duration: 0 hours.  3. How much better is your pain now, when compared to before the procedure? Current benefit: 0 % patient states  pain was a little better for 2 days and then pain went right back to the same as before. 4. Can you move better now? Improvement in ROM (Range of Motion): No. 5. Can you do more now? Improvement in function: No. 4. Did you have any problems with the procedure? Side-effects/Complications: No.

## 2019-06-14 NOTE — Telephone Encounter (Signed)
Patient called cancer center requesting refill of Oxycodone 5-325mg  tab q 8-12 hours prn for pain.  As mandated by the [place] STOP Act (Strengthen Opioid Misuse Prevention), the [place] Controlled Substance Reporting System ([place]) was reviewed for this patient. Below is the past 31-months of controlled substance prescriptions as displayed by the registry. I have personally consulted with my supervising physician, Dr. Rogue Bussing who agrees that continuation of opiate therapy is medically appropriate at this time and agrees to provide continual monitoring, including urine/blood drug screens, as indicated. Refill is appropriate on or after 06/13/19.   [place] reviewed: Reviewed and acceptable for refill.     Faythe Casa, NP 06/14/2019 10:58 AM [phone]

## 2019-06-15 ENCOUNTER — Ambulatory Visit: Payer: Medicare Other | Attending: Pain Medicine | Admitting: Pain Medicine

## 2019-06-15 ENCOUNTER — Other Ambulatory Visit: Payer: Self-pay

## 2019-06-15 ENCOUNTER — Telehealth: Payer: Self-pay | Admitting: *Deleted

## 2019-06-15 DIAGNOSIS — R109 Unspecified abdominal pain: Secondary | ICD-10-CM | POA: Diagnosis not present

## 2019-06-15 DIAGNOSIS — C9110 Chronic lymphocytic leukemia of B-cell type not having achieved remission: Secondary | ICD-10-CM | POA: Diagnosis not present

## 2019-06-15 DIAGNOSIS — M545 Low back pain, unspecified: Secondary | ICD-10-CM | POA: Insufficient documentation

## 2019-06-15 DIAGNOSIS — G8929 Other chronic pain: Secondary | ICD-10-CM

## 2019-06-15 DIAGNOSIS — C8300 Small cell B-cell lymphoma, unspecified site: Secondary | ICD-10-CM | POA: Diagnosis not present

## 2019-06-15 DIAGNOSIS — M5414 Radiculopathy, thoracic region: Secondary | ICD-10-CM

## 2019-06-15 DIAGNOSIS — R1013 Epigastric pain: Secondary | ICD-10-CM | POA: Insufficient documentation

## 2019-06-15 NOTE — Progress Notes (Signed)
Patient: James [name]  Service Category: E/M  Provider: Gaspar Cola, MD  DOB: 07-10-1950  DOS: 06/15/2019  Location: Office  MRN: [id number]  Setting: Ambulatory outpatient  Referring Provider: [name], MD  Type: Established Patient  Specialty: Interventional Pain Management  PCP: [name], MD  Location: Remote location  Delivery: TeleHealth     Virtual Encounter - Pain Management PROVIDER NOTE: Information contained herein reflects review and annotations entered in association with encounter. Interpretation of such information and data should be left to medically-trained personnel. Information provided to patient can be located elsewhere in the medical record under "Patient Instructions". Document created using STT-dictation technology, any transcriptional errors that may result from process are unintentional.    Contact & Pharmacy Preferred: [phone] Home: [phone] (home) Mobile: [phone] (mobile) E-mail: No e-mail address on record  [place] ([address]), [place] [address]) [place] [address] Phone: [phone] Fax: [place], [place] [address] Phone: [phone] Fax: [phone]   Pre-screening  Mr. Deroy offered "in-person" vs "virtual" encounter. He indicated preferring virtual for this encounter.   Reason COVID-19*  Social distancing based on CDC and AMA recommendations.   I contacted James [name] on 06/15/2019 via telephone.      I clearly identified myself as Gaspar Cola, MD. I verified that I was speaking with the correct person using two identifiers (Name: EZEQUIEL MACAULEY, and date of birth: [birth year]).  Consent I sought verbal advanced consent from James [name] for virtual visit interactions. I informed Mr. Schillaci of possible security and privacy  concerns, risks, and limitations associated with providing "not-in-person" medical evaluation and management services. I also informed Mr. Gunning of the availability of "in-person" appointments. Finally, I informed him that there would be a charge for the virtual visit and that he could be  personally, fully or partially, financially responsible for it. Mr. Samaras expressed understanding and agreed to proceed.   Historic Elements   Mr. KALI AMBLER is a 69 y.o. year old, male patient evaluated today after his last contact with our practice on 06/14/2019. Mr. Komar  has a past medical history of CLL (chronic lymphocytic leukemia) ([facility]), Depression, Diabetes mellitus without complication ([place]), Hematuria, Hyperlipidemia, and Hypertension. He also  has a past surgical history that includes Cholecystectomy (1983); Esophagogastroduodenoscopy (egd) with propofol (N/A, 11/20/2016); Cataract extraction w/ intraocular lens implant (Bilateral); Lower Extremity Angiography (Right, 05/19/2017); Esophagogastroduodenoscopy (egd) with propofol (N/A, 10/27/2017); Colonoscopy with propofol (N/A, 10/27/2017); and Lower Extremity Angiography (Left, 08/10/2018). Mr. Mckelvy has a current medication list which includes the following prescription(s): aspirin ec, atorvastatin, calcium plus d3 absorbable, clopidogrel, dexamethasone, ensure, ergocalciferol, folic acid, iron polysaccharides, lisinopril, metformin, oxycodone-acetaminophen, acyclovir, vitamin d3, montelukast, and sodium polystyrene. He  reports that he has been smoking cigarettes. He has a 23.50 pack-year smoking history. He has never used smokeless tobacco. He reports current alcohol use of about 1.0 standard drinks of alcohol per week. He reports that he does not use drugs. Mr. Alverio has No Known Allergies.   HPI  Today, he is being contacted for a post-procedure assessment.  According to the patient he did not get much benefit from the diagnostic injection.  This would suggest  that the pain is not visceral and therefore it is likely to be coming from the abdominal wall.  The patient was also asked today about  other possible associated symptoms and he indicated that he occasionally will have right-sided low back pain.  In view of this and the possibility that this could be a thoracic radicular pain, today I will go ahead and order x-rays of the thoracic and lumbar spine to evaluate for possible anomalies.  Today I asked the patient to give [place] a call as soon as he has the x-ray so that we can schedule a virtual visit and follow-up with those results.  Depending on what they show, that will probably determine what her neck step will be.  Post-Procedure Evaluation  Procedure (06/01/2019): Diagnostic bilateral celiac plexus block #1 under fluoroscopic guidance and IV sedation Pre-procedure pain level:  5/10 Post-procedure: 2/10 (> 50% relief)  Sedation: Sedation provided.  Janett Billow, RN  06/14/2019  1:35 PM  Sign when Signing Visit Pain relief after procedure (treated area only): (Questions asked to patient) 1. Starting about 15 minutes after the procedure, and "while the area was still numb" (from the local anesthetics), were you having any of your usual pain "in that area" (the treated area)?  (NOTE: NOT including the discomfort from the needle sticks.) First 1 hour: 30 % better. First 4-6 hours: 30 % better. 2. How long did the numbness from the local anesthetics last? (More than 6 hours?) Duration: 0 hours.  3. How much better is your pain now, when compared to before the procedure? Current benefit: 0 % patient states  pain was a little better for 2 days and then pain went right back to the same as before. 4. Can you move better now? Improvement in ROM (Range of Motion): No. 5. Can you do more now? Improvement in function: No. 4. Did you have any problems with the procedure? Side-effects/Complications: No.  Current benefits: Defined as benefit that  persist at this time.   Analgesia:  <50% better.  In fact, the patient indicates that he might of had maybe 30% relief of the pain, but in a few hours it was gone.  This would suggest that the pain is really not visceral in nature but it may be more of an issue coming from the abdominal wall.  Today I will be ordering some x-rays of the lumbar and thoracic spine to see if there are any abnormalities that will clueless into what may be causing this. Function: No benefit ROM: No benefit  Pharmacotherapy Assessment  Analgesic: No opioid analgesics prescribed by our practice. Oxycodone/APAP 5/325, 1 tab PO q 8 hrs (15 mg/day of oxycodone IR)  Highest recorded MME/day: 22.5 mg/day MME/day: 22.5 mg/day   Monitoring: [place] PMP: PDMP reviewed during this encounter.       Pharmacotherapy: No side-effects or adverse reactions reported. Compliance: No problems identified. Effectiveness: Clinically acceptable. Plan: Refer to "POC".  UDS: No results found for: SUMMARY Laboratory Chemistry Profile   Renal Lab Results  Component Value Date   BUN 30 (H) 06/07/2019   CREATININE 1.64 (H) 06/07/2019   GFRAA 48 (L) 06/07/2019   GFRNONAA 42 (L) 06/07/2019    Hepatic Lab Results  Component Value Date   AST 12 (L) 05/11/2019   ALT 12 05/11/2019   ALBUMIN 4.6 05/11/2019   ALKPHOS 60 05/11/2019   HCVAB NON REACTIVE 06/03/2019   AMYLASE 30 05/11/2019   LIPASE 28 05/11/2019    Electrolytes Lab Results  Component Value Date   NA 138 06/07/2019   K 5.1 06/07/2019   CL 105 06/07/2019   CALCIUM 9.2 06/07/2019   MG  2.4 06/03/2019   PHOS 4.4 06/03/2019    Bone Lab Results  Component Value Date   VD25OH 13.74 (L) 05/11/2019    Coagulation Lab Results  Component Value Date   INR 1.04 02/09/2018   LABPROT 13.5 02/09/2018   APTT 30 02/09/2018   PLT 162 06/07/2019    Cardiovascular Lab Results  Component Value Date   TROPONINI < 0.02 11/16/2013   HGB 8.9 (L) 06/07/2019   HCT 30.1 (L)  06/07/2019    Inflammation (CRP: Acute Phase) (ESR: Chronic Phase) Lab Results  Component Value Date   CRP 0.6 05/11/2019   ESRSEDRATE 39 (H) 05/11/2019   LATICACIDVEN 1.3 12/28/2018      Note: Above Lab results reviewed.  Imaging  DG PAIN CLINIC C-ARM 1-60 MIN NO REPORT Fluoro was used, but no Radiologist interpretation will be provided.  Please refer to "NOTES" tab for provider progress note.  Assessment  The primary encounter diagnosis was Chronic abdominal pain. Diagnoses of Chronic epigastric pain (Primary Area of Pain), CLL (chronic lymphocytic leukemia) ([facility]), Malignant lymphoma, small lymphocytic ([facility]), Abdominal wall pain in epigastric region, Thoracic radiculitis, and Chronic right-sided low back pain without sciatica were also pertinent to this visit.  Plan of Care  Problem-specific:  No problem-specific Assessment & Plan notes found for this encounter.  I am having Markies F. Grandpre maintain his aspirin EC, atorvastatin, iron polysaccharides, metFORMIN, folic acid, lisinopril, Ensure, clopidogrel, ergocalciferol, Vitamin D3, Calcium Plus D3 Absorbable, sodium polystyrene, montelukast, dexamethasone, acyclovir, and oxyCODONE-acetaminophen.  Pharmacotherapy (Medications Ordered): No orders of the defined types were placed in this encounter.  Orders:  Orders Placed This Encounter  Procedures  . DG Lumbar Spine Complete W/Bend    Patient presents with axial pain with possible radicular component.  In addition to any acute findings, please report on:  1. Facet (Zygapophyseal) joint DJD (Hypertrophy, space narrowing, subchondral sclerosis, and/or osteophyte formation) 2. DDD and/or IVDD (Loss of disc height, desiccation or "Black disc disease") 3. Pars defects 4. Spondylolisthesis, spondylosis, and/or spondyloarthropathies (include Degree/Grade of displacement in mm) 5. Vertebral body Fractures, including age (old, new/acute) 30. Modic Type Changes 7. Demineralization 8.  Bone pathology 9. Central, Lateral Recess, and/or Foraminal Stenosis (include AP diameter of stenosis in mm) 10. Surgical changes (hardware type, status, and presence of fibrosis)  NOTE: Please specify level(s) and laterality. If applicable: Please indicate ROM and/or evidence of instability (>41m displacement between flexion and extension views)    Standing Status:   Future    Standing Expiration Date:   09/12/2019    Order Specific Question:   Reason for Exam (SYMPTOM  OR DIAGNOSIS REQUIRED)    Answer:   Low back pain    Order Specific Question:   Preferred imaging location?    Answer:   [facility]    Order Specific Question:   Call Results- Best Contact Number?    Answer:   [phone][facility]    Order Specific Question:   Radiology Contrast Protocol - do NOT remove file path    Answer:   _[email or web address]  . DG Thoracic Spine 2 View    Patient presents with axial pain with possible radicular component.  In addition to any acute findings, please report on:  1. Facet (Zygapophyseal) joint DJD (Hypertrophy, space narrowing, subchondral sclerosis, and/or osteophyte formation) 2. DDD and/or IVDD (Loss of disc height, desiccation or "Black disc disease") 3. Pars defects 4. Spondylolisthesis, spondylosis, and/or spondyloarthropathies (include Degree/Grade of displacement in mm) 5. Vertebral body Fractures, including  age (old, new/acute) 64. Modic Type Changes 7. Demineralization 8. Bone pathology 9. Central, Lateral Recess, and/or Foraminal Stenosis (include AP diameter of stenosis in mm) 10. Surgical changes (hardware type, status, and presence of fibrosis) NOTE: Please specify level(s) and laterality.    Standing Status:   Future    Standing Expiration Date:   06/14/2020    Order Specific Question:   Reason for Exam (SYMPTOM  OR DIAGNOSIS REQUIRED)    Answer:   Upper back pain and/or thoracic spine pain.    Order Specific  Question:   Preferred imaging location?    Answer:   [facility]    Order Specific Question:   Call Results- Best Contact Number?    Answer:   [phone] (Patient Clinic facility) (Dr. Dossie Arbour)   Follow-up plan:   Return for (VV), (s/p Tests) to evaluate results of lumbar and thoracic x-rays.      Interventional treatment options: Planned, scheduled, and/or pending:    NOTE: PLAVIX ANTICOAGULATION  (Stop: 7 days  Restart: 2 hrs) Diagnostic bilateral celiac plexus block #1 under fluoroscopic guidance and IV sedation   Under consideration:   Diagnostic bilateral celiac plexus block #1 under fluoroscopic guidance and IV sedation   Therapeutic/palliative (PRN):   None at this time     Recent Visits Date Type Provider Dept  06/01/19 Procedure visit Milinda Pointer, MD [facility]  05/19/19 Telemedicine Milinda Pointer, [facility]  05/03/19 Office Visit Milinda Pointer, MD [facility]  Showing recent visits within past 90 days and meeting all other requirements   Today's Visits Date Type Provider Dept  06/15/19 Telemedicine Milinda Pointer, MD [facility]  Showing today's visits and meeting all other requirements   Future Appointments No visits were found meeting these conditions.  Showing future appointments within next 90 days and meeting all other requirements   I discussed the assessment and treatment plan with the patient. The patient was provided an opportunity to ask questions and all were answered. The patient agreed with the plan and demonstrated an understanding of the instructions.  Patient advised to call back or seek an in-person evaluation if the symptoms or condition worsens.  Duration of encounter: 13 minutes.  Note by: Gaspar Cola, MD Date: 06/15/2019; Time: 11:07 AM

## 2019-06-15 NOTE — Progress Notes (Signed)
Spoke with patient and instructed him to have xrays done.

## 2019-06-16 ENCOUNTER — Other Ambulatory Visit: Payer: Medicare Other

## 2019-06-16 ENCOUNTER — Ambulatory Visit: Payer: Medicare Other | Admitting: Internal Medicine

## 2019-06-16 ENCOUNTER — Ambulatory Visit
Admission: RE | Admit: 2019-06-16 | Discharge: 2019-06-16 | Disposition: A | Discharge status: Home / Self Care | Payer: Medicare Other | Source: Ambulatory Visit | Attending: Pain Medicine | Admitting: Pain Medicine

## 2019-06-16 ENCOUNTER — Ambulatory Visit: Payer: Medicare Other

## 2019-06-16 DIAGNOSIS — R1013 Epigastric pain: Secondary | ICD-10-CM | POA: Diagnosis present

## 2019-06-16 DIAGNOSIS — G8929 Other chronic pain: Secondary | ICD-10-CM

## 2019-06-16 DIAGNOSIS — M5414 Radiculopathy, thoracic region: Secondary | ICD-10-CM | POA: Diagnosis present

## 2019-06-16 DIAGNOSIS — M545 Low back pain: Secondary | ICD-10-CM | POA: Insufficient documentation

## 2019-06-18 ENCOUNTER — Ambulatory Visit
Admission: RE | Admit: 2019-06-18 | Discharge: 2019-06-18 | Disposition: A | Discharge status: Home / Self Care | Payer: Medicare Other | Source: Ambulatory Visit | Attending: Internal Medicine | Admitting: Internal Medicine

## 2019-06-18 ENCOUNTER — Telehealth: Payer: Self-pay | Admitting: Licensed Clinical Social Worker

## 2019-06-18 ENCOUNTER — Other Ambulatory Visit: Payer: Self-pay | Admitting: *Deleted

## 2019-06-18 ENCOUNTER — Other Ambulatory Visit: Payer: Self-pay

## 2019-06-18 DIAGNOSIS — D631 Anemia in chronic kidney disease: Secondary | ICD-10-CM

## 2019-06-18 DIAGNOSIS — C9110 Chronic lymphocytic leukemia of B-cell type not having achieved remission: Secondary | ICD-10-CM

## 2019-06-18 NOTE — Telephone Encounter (Signed)
I called the patient to do precharting for Monday. Patient complained of shortness of breath, pain in his abdomen. Advised patient to seek medical attention if his shortness of breath is worsening. Patient states that it worsens with activity. Patient had CT scan today but has no results as of yet. We will see the patient on Monday.

## 2019-06-19 ENCOUNTER — Other Ambulatory Visit (INDEPENDENT_AMBULATORY_CARE_PROVIDER_SITE_OTHER): Payer: Self-pay | Admitting: Nurse Practitioner

## 2019-06-21 ENCOUNTER — Ambulatory Visit: Payer: Medicare Other

## 2019-06-21 ENCOUNTER — Inpatient Hospital Stay (HOSPITAL_BASED_OUTPATIENT_CLINIC_OR_DEPARTMENT_OTHER): Payer: Medicare Other | Admitting: Internal Medicine

## 2019-06-21 ENCOUNTER — Encounter: Payer: Self-pay | Admitting: Internal Medicine

## 2019-06-21 ENCOUNTER — Inpatient Hospital Stay (HOSPITAL_BASED_OUTPATIENT_CLINIC_OR_DEPARTMENT_OTHER): Payer: Medicare Other | Admitting: Hospice and Palliative Medicine

## 2019-06-21 ENCOUNTER — Inpatient Hospital Stay: Payer: Medicare Other

## 2019-06-21 ENCOUNTER — Other Ambulatory Visit: Payer: Self-pay

## 2019-06-21 DIAGNOSIS — C9110 Chronic lymphocytic leukemia of B-cell type not having achieved remission: Secondary | ICD-10-CM

## 2019-06-21 DIAGNOSIS — N183 Chronic kidney disease, stage 3 unspecified: Secondary | ICD-10-CM

## 2019-06-21 DIAGNOSIS — Z515 Encounter for palliative care: Secondary | ICD-10-CM | POA: Diagnosis not present

## 2019-06-21 DIAGNOSIS — G893 Neoplasm related pain (acute) (chronic): Secondary | ICD-10-CM | POA: Diagnosis not present

## 2019-06-21 LAB — CBC WITH DIFFERENTIAL/PLATELET
Abs Immature Granulocytes: 0.09 10*3/uL — ABNORMAL HIGH (ref 0.00–0.07)
Basophils Absolute: 0.3 10*3/uL — ABNORMAL HIGH (ref 0.0–0.1)
Basophils Relative: 1 %
Eosinophils Absolute: 0.2 10*3/uL (ref 0.0–0.5)
Eosinophils Relative: 1 %
HCT: 29.9 % — ABNORMAL LOW (ref 39.0–52.0)
Hemoglobin: 9 g/dL — ABNORMAL LOW (ref 13.0–17.0)
Immature Granulocytes: 0 %
Lymphocytes Relative: 76 %
Lymphs Abs: 26.1 10*3/uL — ABNORMAL HIGH (ref 0.7–4.0)
MCH: 32.8 pg (ref 26.0–34.0)
MCHC: 30.1 g/dL (ref 30.0–36.0)
MCV: 109.1 fL — ABNORMAL HIGH (ref 80.0–100.0)
Monocytes Absolute: 3.2 10*3/uL — ABNORMAL HIGH (ref 0.1–1.0)
Monocytes Relative: 9 %
Neutro Abs: 4.7 10*3/uL (ref 1.7–7.7)
Neutrophils Relative %: 13 %
Platelets: 163 10*3/uL (ref 150–400)
RBC: 2.74 MIL/uL — ABNORMAL LOW (ref 4.22–5.81)
RDW: 17.4 % — ABNORMAL HIGH (ref 11.5–15.5)
Smear Review: ADEQUATE
WBC Morphology: ABNORMAL
WBC: 34.5 10*3/uL — ABNORMAL HIGH (ref 4.0–10.5)
nRBC: 0 % (ref 0.0–0.2)

## 2019-06-21 LAB — BASIC METABOLIC PANEL
Anion gap: 9 (ref 5–15)
BUN: 36 mg/dL — ABNORMAL HIGH (ref 8–23)
CO2: 22 mmol/L (ref 22–32)
Calcium: 9.5 mg/dL (ref 8.9–10.3)
Chloride: 108 mmol/L (ref 98–111)
Creatinine, Ser: 1.64 mg/dL — ABNORMAL HIGH (ref 0.61–1.24)
GFR calc Af Amer: 48 mL/min — ABNORMAL LOW (ref >60)
GFR calc non Af Amer: 42 mL/min — ABNORMAL LOW (ref >60)
Glucose, Bld: 156 mg/dL — ABNORMAL HIGH (ref 70–99)
Potassium: 5.3 mmol/L — ABNORMAL HIGH (ref 3.5–5.1)
Sodium: 139 mmol/L (ref 135–145)

## 2019-06-21 LAB — SAMPLE TO BLOOD BANK

## 2019-06-21 MED ORDER — DARBEPOETIN ALFA 300 MCG/0.6ML IJ SOSY
300.0000 ug | PREFILLED_SYRINGE | Freq: Once | INTRAMUSCULAR | Status: AC
  Administered 2019-06-21: 300 ug via SUBCUTANEOUS
  Filled 2019-06-21: qty 0.6

## 2019-06-21 MED ORDER — POLYSACCHARIDE IRON COMPLEX 150 MG PO CAPS: 150.0000 mg | ORAL_CAPSULE | Freq: Every day | ORAL | 1 refills | Status: DC

## 2019-06-21 MED ORDER — MORPHINE SULFATE ER 15 MG PO TBCR: 15.0000 mg | EXTENDED_RELEASE_TABLET | Freq: Two times a day (BID) | ORAL | 0 refills | Status: DC

## 2019-06-21 NOTE — Progress Notes (Signed)
[facility] [place] OFFICE PROGRESS NOTE  Patient Care Team: James Harrier, MD as PCP - General (Internal Medicine)  Cancer Staging No matching staging information was found for the patient.   Oncology History Overview Note  # 2006- CLL STAGE IV; MAY 2011- WBC- 57K;Platelets-99;Hb-12/CT Bulky LN; BMBx- 80% Invol; del 11; START Benda-Ritux x4 [finished Sep 2011];   # July 2015-Progression; Sep 2015-START ibrutinib; CT scan DEC 2015- Improvement LN; Cont Ibrutinib 131m/d; NOV 2016 CT- 1-2CM LN [mild progression compared to Dec 2015];NOV 2016- FISH peripheral blood- NO MUTATIONS/CD-38 Positive; NOV 7th- CONT IBRUTINIB 2 pills/day; CT AUG 2017- STABLE;  DEC 6th PET- Mild RP LN/ Retrocrural LN  # OFF ibrutinib [? intol]- sep 2019- Jan 16th 2020; Re-start Ibrutinib; September 2020-stop ibrutinib [poor tolerance/worsening anemia]  # Jan 16th 2020- start aranesp  # FEB 2021-James [name]  # [place]- PAIN CONTRACT  # PALLIATIVE CARE- 06/21/2019-  # October 2019-bone marrow biopsy [worsening anemia]-question dyserythropoietic changes/small clone of CLL;  # [facility] H[facility]- NEG.  SA skin infection [Oct 2016] s/p clinda -------------------------------------------------------    DIAGNOSIS: CLL  STAGE:   IV      ;GOALS: palliative  CURRENT/MOST RECENT THERAPY : Gazyva [C]   CLL (chronic lymphocytic leukemia) ([place]  06/21/2019 -  Chemotherapy   The patient had obinutuzumab (GAZYVA) 100 mg in sodium chloride 0.9 % 100 mL (0.9615 mg/mL) chemo infusion, 100 mg, Intravenous, Once, 0 of 6 cycles  for chemotherapy treatment.      INTERVAL HISTORY:  FGeorgette Dover89y.o.  male pleasant patient above history of CLL [currently off ibrutinib] and chronic abdominal pain of unclear etiology; worsening anemia of unclear etiology [? CKD/hyperkalemia] is here for follow-up review results of the CT scan.  Patient has lost about 10 pounds since last visit.  He continues to  feel poorly.  Continues to complain epigastric/abdominal pain.  Not getting any better continue get worse.  Complains of constipation.  Complains of nausea.   Review of Systems  Constitutional: Positive for malaise/fatigue and weight loss. Negative for chills, diaphoresis and fever.  HENT: Negative for nosebleeds and sore throat.   Eyes: Negative for double vision.  Respiratory: Negative for cough, hemoptysis, sputum production, shortness of breath and wheezing.   Cardiovascular: Negative for chest pain, palpitations, orthopnea and leg swelling.  Gastrointestinal: Positive for abdominal pain, constipation and nausea. Negative for blood in stool, diarrhea, heartburn, melena and vomiting.  Genitourinary: Negative for dysuria, frequency and urgency.  Skin: Negative.  Negative for itching and rash.  Neurological: Negative for dizziness, tingling, focal weakness, weakness and headaches.  Endo/Heme/Allergies: Does not bruise/bleed easily.  Psychiatric/Behavioral: Negative for depression. The patient is not nervous/anxious and does not have insomnia.       PAST MEDICAL HISTORY :  Past Medical History:  Diagnosis Date  . CLL (chronic lymphocytic leukemia) ([place]   . Depression   . Diabetes mellitus without complication ([place]   . Hematuria   . Hyperlipidemia   . Hypertension     PAST SURGICAL HISTORY :   Past Surgical History:  Procedure Laterality Date  . CATARACT EXTRACTION W/ INTRAOCULAR LENS IMPLANT Bilateral   . CHOLECYSTECTOMY  1983  . COLONOSCOPY WITH PROPOFOL N/A 10/27/2017   Procedure: COLONOSCOPY WITH PROPOFOL;  Surgeon: James Silvas MD;  Location: [facility] CENDOSCOPY;  Service: Endoscopy;  Laterality: N/A;  . ESOPHAGOGASTRODUODENOSCOPY (EGD) WITH PROPOFOL N/A 11/20/2016   Procedure: ESOPHAGOGASTRODUODENOSCOPY (EGD) WITH PROPOFOL;  Surgeon: James Silvas MD;  Location: [facility] ENDOSCOPY;  Service: Endoscopy;  Laterality: N/A;  . ESOPHAGOGASTRODUODENOSCOPY (EGD) WITH PROPOFOL  N/A 10/27/2017   Procedure: ESOPHAGOGASTRODUODENOSCOPY (EGD) WITH PROPOFOL;  Surgeon: James Silvas, MD;  Location[facility] ENDOSCOPY;  Service: Endoscopy;  Laterality: N/A;  . LOWER EXTREMITY ANGIOGRAPHY Right 05/19/2017   Procedure: LOWER EXTREMITY ANGIOGRAPHY;  Surgeon: James Huxley, MD;  Location: [facility] CV LAB;  Service: Cardiovascular;  Laterality: Right;  . LOWER EXTREMITY ANGIOGRAPHY Left 08/10/2018   Procedure: LOWER EXTREMITY ANGIOGRAPHY;  Surgeon: James Huxley, MD;  Location: [facility] LAB;  Service: Cardiovascular;  Laterality: Left;    FAMILY HISTORY :   Family History  Problem Relation Age of Onset  . Hypertension Sister     SOCIAL HISTORY:   Social History   Tobacco Use  . Smoking status: Current Every Day Smoker    Packs/day: 0.50    Years: 47.00    Pack years: 23.50    Types: Cigarettes  . Smokeless tobacco: Never Used  Substance Use Topics  . Alcohol use: Yes    Alcohol/week: 1.0 standard drinks    Types: 1 Glasses of wine per week    Comment:  almost none in last 6 months  . Drug use: No    ALLERGIES:  has No Known Allergies.  MEDICATIONS:  Current Outpatient Medications  Medication Sig Dispense Refill  . aspirin EC 81 MG tablet Take 81 mg by mouth daily.    Marland Kitchen atorvastatin (LIPITOR) 10 MG tablet Take 1 tablet (10 mg total) by mouth daily. 30 tablet 11  . Calcium Carb-Cholecalciferol (CALCIUM PLUS D3 ABSORBABLE) [phone] MG-UNIT CAPS Take 1 capsule by mouth 2 (two) times daily with a meal. 60 capsule 5  . clopidogrel (PLAVIX) 75 MG tablet Take 75 mg by mouth daily.    . Ensure (ENSURE) Take 237 mLs by mouth.    . ergocalciferol (VITAMIN D2) 1.25 MG (50000 UT) capsule Take 1 capsule (50,000 Units total) by mouth 2 (two) times a week. X 6 weeks. 12 capsule 0  . folic acid (FOLVITE) 1 MG tablet Take 1 tablet (1 mg total) by mouth daily. 30 tablet 1  . iron polysaccharides (NU-IRON) 150 MG capsule Take 1 capsule (150 mg total) by mouth daily. 30  capsule 1  . lisinopril (ZESTRIL) 10 MG tablet Take 1 tablet (10 mg total) by mouth daily. 30 tablet 0  . metFORMIN (GLUCOPHAGE) 1000 MG tablet Take 1,000 mg by mouth daily with breakfast.     . oxyCODONE-acetaminophen (PERCOCET/ROXICET) 5-325 MG tablet 1 pill every 8-12 hours 90 tablet 0  . acyclovir (ZOVIRAX) 400 MG tablet One pill a day [to prevent shingles] (Patient not taking: Reported on 06/14/2019) 30 tablet 3  . Cholecalciferol (VITAMIN D3) 125 MCG (5000 UT) CAPS Take 1 capsule (5,000 Units total) by mouth daily with breakfast. Take along with calcium and magnesium. (Patient not taking: Reported on 06/14/2019) 30 capsule 5  . dexamethasone (DECADRON) 4 MG tablet Start 2 days prior to infusion; Take for 2 days. Do not take on the day of infusion. (Patient not taking: Reported on 06/21/2019) 60 tablet 3  . montelukast (SINGULAIR) 10 MG tablet Take 1 tablet (10 mg total) by mouth at bedtime. Start 2 days prior to infusion. Take it for 4 days. (Patient not taking: Reported on 06/14/2019) 60 tablet 0  . sodium polystyrene (KAYEXALATE) 15 GM/60ML suspension Take 60 mLs (15 g total) by mouth every 6 (six) hours. Until 2 loose stools (Patient not taking: Reported  on 06/14/2019) 240 mL 0   No current facility-administered medications for this visit.    PHYSICAL EXAMINATION: ECOG PERFORMANCE STATUS: 1 - Symptomatic but completely ambulatory  BP 120/81 (BP Location: Left Arm, Patient Position: Sitting, Cuff Size: Normal)   Pulse (!) 108   Temp (!) 96.5 F (35.8 C) (Tympanic)   Wt 146 lb 6.4 oz (66.4 kg)   SpO2 100% Comment: room air  BMI 22.26 kg/m   Filed Weights   06/21/19 0830  Weight: 146 lb 6.4 oz (66.4 kg)    Physical Exam  Constitutional: He is oriented to person, place, and time and well-developed, well-nourished, and in no distress.  He is alone.  Appears pale.  HENT:  Head: Normocephalic and atraumatic.  Mouth/Throat: Oropharynx is clear and moist. No oropharyngeal exudate.   Eyes: Pupils are equal, round, and reactive to light.  Cardiovascular: Normal rate and regular rhythm.  Pulmonary/Chest: No respiratory distress. He has no wheezes.  Decreased air entry bil.   Abdominal: Soft. Bowel sounds are normal. He exhibits no distension and no mass. There is no abdominal tenderness. There is no rebound and no guarding.  Musculoskeletal:        General: No tenderness or edema. Normal range of motion.     Cervical back: Normal range of motion and neck supple.  Neurological: He is alert and oriented to person, place, and time.  Skin: Skin is warm. There is pallor.  Multiple bruises noted in bilateral upper extremity.  Psychiatric: Affect normal.      LABORATORY DATA:  I have reviewed the data as listed    Component Value Date/Time   NA 139 06/21/2019 0802   NA 142 05/03/2014 1139   K 5.3 (H) 06/21/2019 0802   K 4.7 05/03/2014 1139   CL 108 06/21/2019 0802   CL 110 (H) 05/03/2014 1139   CO2 22 06/21/2019 0802   CO2 24 05/03/2014 1139   GLUCOSE 156 (H) 06/21/2019 0802   GLUCOSE 98 05/03/2014 1139   BUN 36 (H) 06/21/2019 0802   BUN 20 (H) 05/03/2014 1139   CREATININE 1.64 (H) 06/21/2019 0802   CREATININE 1.22 08/09/2014 1122   CALCIUM 9.5 06/21/2019 0802   CALCIUM 8.6 05/03/2014 1139   PROT 7.4 05/11/2019 1117   PROT 7.5 05/03/2014 1139   ALBUMIN 4.6 05/11/2019 1117   ALBUMIN 4.1 05/03/2014 1139   AST 12 (L) 05/11/2019 1117   AST 15 05/03/2014 1139   ALT 12 05/11/2019 1117   ALT 26 05/03/2014 1139   ALKPHOS 60 05/11/2019 1117   ALKPHOS 94 05/03/2014 1139   BILITOT 0.5 05/11/2019 1117   BILITOT 0.5 05/03/2014 1139   GFRNONAA 42 (L) 06/21/2019 0802   GFRNONAA >60 08/09/2014 1122   GFRAA 48 (L) 06/21/2019 0802   GFRAA >60 08/09/2014 1122    No results found for: SPEP, UPEP  Lab Results  Component Value Date   WBC 34.5 (H) 06/21/2019   NEUTROABS 4.7 06/21/2019   HGB 9.0 (L) 06/21/2019   HCT 29.9 (L) 06/21/2019   MCV 109.1 (H) 06/21/2019    PLT 163 06/21/2019      Chemistry      Component Value Date/Time   NA 139 06/21/2019 0802   NA 142 05/03/2014 1139   K 5.3 (H) 06/21/2019 0802   K 4.7 05/03/2014 1139   CL 108 06/21/2019 0802   CL 110 (H) 05/03/2014 1139   CO2 22 06/21/2019 0802   CO2 24 05/03/2014 1139   BUN 36 (  H) 06/21/2019 0802   BUN 20 (H) 05/03/2014 1139   CREATININE 1.64 (H) 06/21/2019 0802   CREATININE 1.22 08/09/2014 1122      Component Value Date/Time   CALCIUM 9.5 06/21/2019 0802   CALCIUM 8.6 05/03/2014 1139   ALKPHOS 60 05/11/2019 1117   ALKPHOS 94 05/03/2014 1139   AST 12 (L) 05/11/2019 1117   AST 15 05/03/2014 1139   ALT 12 05/11/2019 1117   ALT 26 05/03/2014 1139   BILITOT 0.5 05/11/2019 1117   BILITOT 0.5 05/03/2014 1139       RADIOGRAPHIC STUDIES: I have personally reviewed the radiological images as listed and agreed with the findings in the report. No results found.   ASSESSMENT & PLAN:  CLL (chronic lymphocytic leukemia) ([place]) # CLL/SLL- relapsed most recently on ibrutinib; FEB 2021- CT Ab/Pelvis-CT scan chest and pelvis shows significant progression of the lymphadenopathy-axillary/mediastinal/abdominal/pelvic-however the largest lesion approximately inch in size.  Rising white count-suggestive of progressive disease.  #I again reviewed with the patient regarding starting Gazyva.  Treatments are monthly IV infusions; long hours of infusion.  First month would be more frequent.  Treatments are palliative.  Discussed the potential risk of infusion reactions/side effects including but not limited to increased risk of infections/low blood counts etc.  Also discussed at length regarding-premedications-Tylenol/Claritin; Singulair/dexamethasone prior.  Patient agrees.  #Anemia-hemoglobin ~9 CKD versus progressive leukemia.  Continue Aranesp.  See plan above  #CKD-III-Hyperkalemia vs-pseudohyperkalemia;  labile; discussed with Dr.Lateef.  Today potassium 5.1;   # Abdominal  pain/dyspepsia/unclear etiology [no clear evidence of malignancy causing the pain as the largest lymph node is about 2.2 cm in size] -worsening discussed with Josh Borders okay with the long-acting pain medication.  Percocet for now.  S/p celiac plexus block.  # Palliative care evaluation: awaiting palliative care philosophy and services; evluation today.  Discussed with Josh.  # DISPOSITION: #  Aranesp today; # in 1 weeks-MD-- lab- cbc/cmp/LDHDyann [name] d-1; d-2;GaZYVA  #  D-8- MD; labs- cbc/bmp;Gazyva- Dr.B  # I reviewed the blood work- with the patient in detail; also reviewed the imaging independently [as summarized above]; and with the patient in detail.      Orders Placed This Encounter  Procedures  . CBC with Differential    Standing Status:   Future    Standing Expiration Date:   06/20/2020  . Comprehensive metabolic panel    Standing Status:   Future    Standing Expiration Date:   06/20/2020  . Lactate dehydrogenase    Standing Status:   Future    Standing Expiration Date:   06/20/2020   All questions were answered. The patient knows to call the clinic with any problems, questions or concerns.      Cammie Sickle, MD 06/21/2019 2:03 PM

## 2019-06-21 NOTE — Progress Notes (Signed)
James [name]  Telephone:([phone] [phone]   Name: James [name] Date: 06/21/2019 MRN: [id number]  DOB: Dec 03, 1950  Patient Care Team: [name], MD as PCP - General (Internal Medicine)    REASON FOR CONSULTATION: James [name] is a 69 y.o. male with multiple medical problems including stage IV CLL (initially diagnosed in 2006) most recently treated with ibrutinib but was discontinued due to poor tolerance. He is now on treatment with Gazyva.  Patient has had severe and chronic abdominal pain of unclear etiology.  He has had extensive work-up including referral to [facility].  He has been followed by interventional management in the status post celiac plexus blocks.  Patient has continued to lose weight.  He was referred to palliative care to help address goals and manage ongoing symptoms.  SOCIAL HISTORY:     reports that he has been smoking cigarettes. He has a 23.50 pack-year smoking history. He has never used smokeless tobacco. He reports current alcohol use of about 1.0 standard drinks of alcohol per week. He reports that he does not use drugs.  Patient is married and lives at home with his wife.  He has a son in [place].  Patient formally worked as a Administrator.  ADVANCE DIRECTIVES:  Does not have  CODE STATUS:   PAST MEDICAL HISTORY: Past Medical History:  Diagnosis Date  . CLL (chronic lymphocytic leukemia) ([place])   . Depression   . Diabetes mellitus without complication ([place])   . Hematuria   . Hyperlipidemia   . Hypertension     PAST SURGICAL HISTORY:  Past Surgical History:  Procedure Laterality Date  . CATARACT EXTRACTION W/ INTRAOCULAR LENS IMPLANT Bilateral   . CHOLECYSTECTOMY  1983  . COLONOSCOPY WITH PROPOFOL N/A 10/27/2017   Procedure: COLONOSCOPY WITH PROPOFOL;  Surgeon: Manya Silvas, MD;  Location: [facility] ENDOSCOPY;  Service: Endoscopy;  Laterality: N/A;  . ESOPHAGOGASTRODUODENOSCOPY  (EGD) WITH PROPOFOL N/A 11/20/2016   Procedure: ESOPHAGOGASTRODUODENOSCOPY (EGD) WITH PROPOFOL;  Surgeon: Manya Silvas, MD;  Location: [facility] ENDOSCOPY;  Service: Endoscopy;  Laterality: N/A;  . ESOPHAGOGASTRODUODENOSCOPY (EGD) WITH PROPOFOL N/A 10/27/2017   Procedure: ESOPHAGOGASTRODUODENOSCOPY (EGD) WITH PROPOFOL;  Surgeon: Manya Silvas, MD;  Location: [facility] ENDOSCOPY;  Service: Endoscopy;  Laterality: N/A;  . LOWER EXTREMITY ANGIOGRAPHY Right 05/19/2017   Procedure: LOWER EXTREMITY ANGIOGRAPHY;  Surgeon: Algernon Huxley, MD;  Location: [place] CV LAB;  Service: Cardiovascular;  Laterality: Right;  . LOWER EXTREMITY ANGIOGRAPHY Left 08/10/2018   Procedure: LOWER EXTREMITY ANGIOGRAPHY;  Surgeon: Algernon Huxley, MD;  Location: [place] CV LAB;  Service: Cardiovascular;  Laterality: Left;    HEMATOLOGY/ONCOLOGY HISTORY:  Oncology History Overview Note  # 2006- CLL STAGE IV; MAY 2011- WBC- 57K;Platelets-99;Hb-12/CT Bulky LN; BMBx- 80% Invol; del 11; START Benda-Ritux x4 [finished Sep 2011];   # July 2015-Progression; Sep 2015-START ibrutinib; CT scan DEC 2015- Improvement LN; Cont Ibrutinib 148m/d; NOV 2016 CT- 1-2CM LN [mild progression compared to Dec 2015];NOV 2016- FISH peripheral blood- NO MUTATIONS/CD-38 Positive; NOV 7th- CONT IBRUTINIB 2 pills/day; CT AUG 2017- STABLE;  DEC 6th PET- Mild RP LN/ Retrocrural LN  # OFF ibrutinib [? intol]- sep 2019- Jan 16th 2020; Re-start Ibrutinib; September 2020-stop ibrutinib [poor tolerance/worsening anemia]  # Jan 16th 2020- start aranesp  # FEB 2021-[name]  # [place]- [place] CONTRACT  # PALLIATIVE CARE- 06/21/2019-  # October 2019-bone marrow biopsy [worsening anemia]-question dyserythropoietic changes/small clone of CLL;  #  [facility] HEM- NEG.  SA skin infection [Oct 2016] s/p clinda -------------------------------------------------------    DIAGNOSIS: CLL  STAGE:   IV      ;GOALS: palliative  CURRENT/MOST RECENT THERAPY  : Gazyva [C]   CLL (chronic lymphocytic leukemia) ([place])  06/21/2019 -  Chemotherapy   The patient had obinutuzumab (GAZYVA) 100 mg in sodium chloride 0.9 % 100 mL (0.9615 mg/mL) chemo infusion, 100 mg, Intravenous, Once, 0 of 6 cycles  for chemotherapy treatment.      ALLERGIES:  has No Known Allergies.  MEDICATIONS:  Current Outpatient Medications  Medication Sig Dispense Refill  . acyclovir (ZOVIRAX) 400 MG tablet One pill a day [to prevent shingles] (Patient not taking: Reported on 06/14/2019) 30 tablet 3  . aspirin EC 81 MG tablet Take 81 mg by mouth daily.    Marland Kitchen atorvastatin (LIPITOR) 10 MG tablet Take 1 tablet (10 mg total) by mouth daily. 30 tablet 11  . Calcium Carb-Cholecalciferol (CALCIUM PLUS D3 ABSORBABLE) [phone] MG-UNIT CAPS Take 1 capsule by mouth 2 (two) times daily with a meal. 60 capsule 5  . Cholecalciferol (VITAMIN D3) 125 MCG (5000 UT) CAPS Take 1 capsule (5,000 Units total) by mouth daily with breakfast. Take along with calcium and magnesium. (Patient not taking: Reported on 06/14/2019) 30 capsule 5  . clopidogrel (PLAVIX) 75 MG tablet Take 1 tablet by mouth once daily 90 tablet 0  . dexamethasone (DECADRON) 4 MG tablet Start 2 days prior to infusion; Take for 2 days. Do not take on the day of infusion. (Patient not taking: Reported on 06/21/2019) 60 tablet 3  . Ensure (ENSURE) Take 237 mLs by mouth.    . ergocalciferol (VITAMIN D2) 1.25 MG (50000 UT) capsule Take 1 capsule (50,000 Units total) by mouth 2 (two) times a week. X 6 weeks. 12 capsule 0  . folic acid (FOLVITE) 1 MG tablet Take 1 tablet (1 mg total) by mouth daily. 30 tablet 1  . iron polysaccharides (NU-IRON) 150 MG capsule Take 1 capsule (150 mg total) by mouth daily. 30 capsule 1  . lisinopril (ZESTRIL) 10 MG tablet Take 1 tablet (10 mg total) by mouth daily. 30 tablet 0  . metFORMIN (GLUCOPHAGE) 1000 MG tablet Take 1,000 mg by mouth daily with breakfast.     . montelukast (SINGULAIR) 10 MG tablet Take 1  tablet (10 mg total) by mouth at bedtime. Start 2 days prior to infusion. Take it for 4 days. (Patient not taking: Reported on 06/14/2019) 60 tablet 0  . oxyCODONE-acetaminophen (PERCOCET/ROXICET) 5-325 MG tablet 1 pill every 8-12 hours 90 tablet 0  . sodium polystyrene (KAYEXALATE) 15 GM/60ML suspension Take 60 mLs (15 g total) by mouth every 6 (six) hours. Until 2 loose stools (Patient not taking: Reported on 06/14/2019) 240 mL 0   No current facility-administered medications for this visit.    VITAL SIGNS: There were no vitals taken for this visit. There were no vitals filed for this visit.  Estimated body mass index is 22.26 kg/m as calculated from the following:   Height as of 06/07/19: _0  (1.727 m).   Weight as of an earlier encounter on 06/21/19: 146 lb 6.4 oz (66.4 kg).  LABS: CBC:    Component Value Date/Time   WBC 34.5 (H) 06/21/2019 0802   HGB 9.0 (L) 06/21/2019 0802   HGB 11.4 (L) 08/09/2014 1122   HCT 29.9 (L) 06/21/2019 0802   HCT 35.6 (L) 08/09/2014 1122   PLT 163 06/21/2019 0802   PLT 95 (  L) 08/09/2014 1122   MCV 109.1 (H) 06/21/2019 0802   MCV 100 08/09/2014 1122   NEUTROABS 4.7 06/21/2019 0802   NEUTROABS 4.3 08/09/2014 1122   LYMPHSABS 26.1 (H) 06/21/2019 0802   LYMPHSABS 6.5 (H) 08/09/2014 1122   MONOABS 3.2 (H) 06/21/2019 0802   MONOABS 1.1 (H) 08/09/2014 1122   EOSABS 0.2 06/21/2019 0802   EOSABS 0.1 08/09/2014 1122   BASOSABS 0.3 (H) 06/21/2019 0802   BASOSABS 0.1 08/09/2014 1122   Comprehensive Metabolic Panel:    Component Value Date/Time   NA 139 06/21/2019 0802   NA 142 05/03/2014 1139   K 5.3 (H) 06/21/2019 0802   K 4.7 05/03/2014 1139   CL 108 06/21/2019 0802   CL 110 (H) 05/03/2014 1139   CO2 22 06/21/2019 0802   CO2 24 05/03/2014 1139   BUN 36 (H) 06/21/2019 0802   BUN 20 (H) 05/03/2014 1139   CREATININE 1.64 (H) 06/21/2019 0802   CREATININE 1.22 08/09/2014 1122   GLUCOSE 156 (H) 06/21/2019 0802   GLUCOSE 98 05/03/2014 1139    CALCIUM 9.5 06/21/2019 0802   CALCIUM 8.6 05/03/2014 1139   AST 12 (L) 05/11/2019 1117   AST 15 05/03/2014 1139   ALT 12 05/11/2019 1117   ALT 26 05/03/2014 1139   ALKPHOS 60 05/11/2019 1117   ALKPHOS 94 05/03/2014 1139   BILITOT 0.5 05/11/2019 1117   BILITOT 0.5 05/03/2014 1139   PROT 7.4 05/11/2019 1117   PROT 7.5 05/03/2014 1139   ALBUMIN 4.6 05/11/2019 1117   ALBUMIN 4.1 05/03/2014 1139    RADIOGRAPHIC STUDIES: CT Abdomen Pelvis Wo Contrast  Result Date: 06/18/2019 CLINICAL DATA:  Restaging CLL. Initial diagnosis 2011. Decreased appetite and weight loss over the past 3-4 months. EXAM: CT CHEST, ABDOMEN AND PELVIS WITHOUT CONTRAST TECHNIQUE: Multidetector CT imaging of the chest, abdomen and pelvis was performed following the standard protocol without IV contrast. COMPARISON:  CT scan 11/17/2018 FINDINGS: CT CHEST FINDINGS Cardiovascular: The heart is normal in size. No pericardial effusion. There is tortuosity and moderate atherosclerotic calcification involving the thoracic aorta. No focal aneurysm. Branch vessel calcifications including three-vessel coronary artery calcifications are again noted. Mediastinum/Nodes: Lower neck, supraclavicular, supraclavicular fossa, subpectoral and bilateral axillary adenopathy. Left supraclavicular lymph node on image 1/2 measures 11 mm. I do not see this on the prior study. Right subpectoral lymph node on image 11/2 measures 13 mm. This measures 6 mm on the prior study. Right axillary lymph node on image 14/2 measures 18.5 mm and previously measured 9 mm. 17 mm left axillary lymph node on image 15/2 previously measured 10 mm. 10.5 mm precarinal lymph node on image 27/2 previously measured 6 mm. AP window node on image 24/2 measures 9.5 mm and previously measured 5.5 mm. Lungs/Pleura: Stable mild emphysematous changes. No acute pulmonary findings. No worrisome pulmonary lesions. No enlarged pleural nodes. No pleural effusion. Musculoskeletal: No chest wall  mass. The thyroid gland appears normal. The bony structures are unremarkable. Mild osteoporosis. CT ABDOMEN PELVIS FINDINGS Hepatobiliary: No focal hepatic lesions are identified without contrast. Gallbladder is surgically absent. No common bile duct dilatation. Pancreas: No mass, inflammation or ductal dilatation. Spleen: Mild splenomegaly. The spleen measures 12.0 x 9.5 x 8.0 cm. No splenic lesions. Adrenals/Urinary Tract: The adrenal glands and kidneys are unremarkable. There are small bilateral renal calculi and bilateral renal cysts. No worrisome renal lesions. The bladder is unremarkable. Stomach/Bowel: The stomach, duodenum, small bowel and colon are unremarkable. No acute inflammatory changes, mass lesions or obstructive  findings. The terminal ileum and appendix are normal. Vascular/Lymphatic: Advanced atherosclerotic calcifications involving the aorta and branch vessels. Bilateral iliac artery stents are noted. Enlarging mesenteric and retroperitoneal lymph nodes. 10.5 mm mesenteric node on image 73/2 is new. Left-sided retroperitoneal nodal mass measures 3.4 x 2.3 cm on image 79/2. This previously measured less than 1 cm. Right-sided retroperitoneal adenopathy is also noted. Small bilateral pelvic sidewall lymph nodes are noted. 11 mm obturator lymph node on image 117/2 is new. No inguinal adenopathy. Reproductive: The prostate gland is enlarged. There is median lobe hypertrophy impressing on the base the bladder. The seminal vesicles appear normal. Other: No free pelvic fluid collections. No inguinal hernia. Musculoskeletal: No significant bony findings. IMPRESSION: 1. Significant progression of lymphadenopathy in the lower neck, chest, abdomen and pelvis as detailed above. 2. Mild splenomegaly. 3. Advanced atherosclerotic calcifications involving the thoracic and abdominal aorta and branch vessels including three-vessel coronary artery calcifications. 4. Bilateral renal calculi and renal cysts. 5.  Enlarged prostate gland with median lobe hypertrophy impressing on the base the bladder. Aortic Atherosclerosis (ICD10-I70.0). Electronically Signed   By: Marijo Sanes M.D.   On: 06/18/2019 14:45   DG Thoracic Spine 2 View  Result Date: 06/16/2019 CLINICAL DATA:  Upper back pain for several months, no known injury, initial encounter EXAM: THORACIC SPINE 2 VIEWS COMPARISON:  None. FINDINGS: Vertebral body height is well maintained. Mild osteophytes are noted. Pedicles are within normal limits and no paraspinal mass lesion is seen. No soft tissue abnormality or rib abnormality is seen. IMPRESSION: Degenerative change without acute abnormality. Electronically Signed   By: Inez Catalina M.D.   On: 06/16/2019 16:15   DG Lumbar Spine Complete W/Bend  Result Date: 06/16/2019 CLINICAL DATA:  Low back pain for several months EXAM: LUMBAR SPINE - COMPLETE WITH BENDING VIEWS COMPARISON:  11/17/2018 FINDINGS: Five lumbar type vertebral bodies are well visualized. Vertebral body height is well maintained. No pars defects are seen. Facet hypertrophic changes are noted. No anterolisthesis is seen. Flexion and extension views show no significant instability. Bilateral iliac stenting is noted. Aortic calcifications are seen without aneurysmal dilatation. IMPRESSION: Degenerative change without instability. Electronically Signed   By: Inez Catalina M.D.   On: 06/16/2019 16:14   CT CHEST WO CONTRAST  Result Date: 06/18/2019 CLINICAL DATA:  Restaging CLL. Initial diagnosis 2011. Decreased appetite and weight loss over the past 3-4 months. EXAM: CT CHEST, ABDOMEN AND PELVIS WITHOUT CONTRAST TECHNIQUE: Multidetector CT imaging of the chest, abdomen and pelvis was performed following the standard protocol without IV contrast. COMPARISON:  CT scan 11/17/2018 FINDINGS: CT CHEST FINDINGS Cardiovascular: The heart is normal in size. No pericardial effusion. There is tortuosity and moderate atherosclerotic calcification involving  the thoracic aorta. No focal aneurysm. Branch vessel calcifications including three-vessel coronary artery calcifications are again noted. Mediastinum/Nodes: Lower neck, supraclavicular, supraclavicular fossa, subpectoral and bilateral axillary adenopathy. Left supraclavicular lymph node on image 1/2 measures 11 mm. I do not see this on the prior study. Right subpectoral lymph node on image 11/2 measures 13 mm. This measures 6 mm on the prior study. Right axillary lymph node on image 14/2 measures 18.5 mm and previously measured 9 mm. 17 mm left axillary lymph node on image 15/2 previously measured 10 mm. 10.5 mm precarinal lymph node on image 27/2 previously measured 6 mm. AP window node on image 24/2 measures 9.5 mm and previously measured 5.5 mm. Lungs/Pleura: Stable mild emphysematous changes. No acute pulmonary findings. No worrisome pulmonary lesions. No enlarged pleural  nodes. No pleural effusion. Musculoskeletal: No chest wall mass. The thyroid gland appears normal. The bony structures are unremarkable. Mild osteoporosis. CT ABDOMEN PELVIS FINDINGS Hepatobiliary: No focal hepatic lesions are identified without contrast. Gallbladder is surgically absent. No common bile duct dilatation. Pancreas: No mass, inflammation or ductal dilatation. Spleen: Mild splenomegaly. The spleen measures 12.0 x 9.5 x 8.0 cm. No splenic lesions. Adrenals/Urinary Tract: The adrenal glands and kidneys are unremarkable. There are small bilateral renal calculi and bilateral renal cysts. No worrisome renal lesions. The bladder is unremarkable. Stomach/Bowel: The stomach, duodenum, small bowel and colon are unremarkable. No acute inflammatory changes, mass lesions or obstructive findings. The terminal ileum and appendix are normal. Vascular/Lymphatic: Advanced atherosclerotic calcifications involving the aorta and branch vessels. Bilateral iliac artery stents are noted. Enlarging mesenteric and retroperitoneal lymph nodes. 10.5 mm  mesenteric node on image 73/2 is new. Left-sided retroperitoneal nodal mass measures 3.4 x 2.3 cm on image 79/2. This previously measured less than 1 cm. Right-sided retroperitoneal adenopathy is also noted. Small bilateral pelvic sidewall lymph nodes are noted. 11 mm obturator lymph node on image 117/2 is new. No inguinal adenopathy. Reproductive: The prostate gland is enlarged. There is median lobe hypertrophy impressing on the base the bladder. The seminal vesicles appear normal. Other: No free pelvic fluid collections. No inguinal hernia. Musculoskeletal: No significant bony findings. IMPRESSION: 1. Significant progression of lymphadenopathy in the lower neck, chest, abdomen and pelvis as detailed above. 2. Mild splenomegaly. 3. Advanced atherosclerotic calcifications involving the thoracic and abdominal aorta and branch vessels including three-vessel coronary artery calcifications. 4. Bilateral renal calculi and renal cysts. 5. Enlarged prostate gland with median lobe hypertrophy impressing on the base the bladder. Aortic Atherosclerosis (ICD10-I70.0). Electronically Signed   By: Marijo Sanes M.D.   On: 06/18/2019 14:45   DG PAIN CLINIC C-ARM 1-60 MIN NO REPORT  Result Date: 06/01/2019 Fluoro was used, but no Radiologist interpretation will be provided. Please refer to "NOTES" tab for provider progress note.   PERFORMANCE STATUS (ECOG) : 1 - Symptomatic but completely ambulatory  Review of Systems Unless otherwise noted, a complete review of systems is negative.  Physical Exam General: NAD, frail appearing, thin Pulmonary: Unlabored Extremities: no edema, no joint deformities Skin: no rashes Neurological: Weakness but otherwise nonfocal  IMPRESSION: Met with patient today in the clinic.  I introduced palliative care services and attempted establish therapeutic rapport.  Patient reports that overall he feels he is doing poorly.  He has chronic abdominal pain, which he describes as severe  and persistent.  Patient reports focal gastric pain.  Discussed with Dr. Rogue Bussing.  Patient has had an extensive work-up including GI referral and EGD.  Patient is actively followed by pain management and is status post celiac plexus block but unfortunately this did not appear to significantly improve patient's symptoms.  Patient does take Percocet about 3 times a day.  He does find some relief with use of as needed opioids.  Patient says his pain averages 8 out of 10 but will drop to 4-5 out of 10 after use of Percocet.  However, patient describes the effect as short-lived.  Patient is interested in trying a long-acting opioid to see if this provides better somatic control.  I note the patient does have a pain contract with our clinic.  Patient reports that he has minimal oral intake.  He is just not hungry much of the time.  I do note persistent downtrending weights.  Weight is down 9 pounds over  the past 2 weeks.  We did discuss the importance of increasing his caloric intake.  I recommended oral supplements.  Will refer to the dietitian.  We did discuss establishing ACP documents today.  Patient says that he would want his wife to be his decision maker if needed.  Patient says that he would not want aggressive measures at end-of-life such as CPR or intubation.  I did send him home with a MOST Form to review with his family and hopefully we can complete this during a future clinic visit.  PLAN: -Continue current scope of treatment -Start MS Contin 15 mg every 12 hours (#30) -Continue Percocet 5-347m every 8 hours as needed for breakthrough pain -Prophylactic bowel regimen -Start daily supplements 3 times daily -Referral to RD -ACP/MOST form reviewed -RTC in 2 to 3 weeks   Patient expressed understanding and was in agreement with this plan. He also understands that He can call the clinic at any time with any questions, concerns, or complaints.     Time Total: 30 minutes  Visit  consisted of counseling and education dealing with the complex and emotionally intense issues of symptom management and palliative care in the setting of serious and potentially life-threatening illness.Greater than 50%  of this time was spent counseling and coordinating care related to the above assessment and plan.  Signed by: JAltha Harm PhD, NP-C

## 2019-06-21 NOTE — Progress Notes (Signed)
START ON PATHWAY REGIMEN - Lymphoma and CLL     Cycle 1: A cycle is 28 days:     Venetoclax      Obinutuzumab      Obinutuzumab      Obinutuzumab    Cycle 2: A cycle is 28 days:     Venetoclax      Venetoclax      Venetoclax      Venetoclax      Obinutuzumab    Cycles 3 through 6: A cycle is 28 days:     Venetoclax      Obinutuzumab    Cycles 7 through 12: A cycle is 28 days:     Venetoclax   **Always confirm dose/schedule in your pharmacy ordering system**  Patient Characteristics: Chronic Lymphocytic Leukemia (CLL), First Line, Treatment Indicated, 17p del (+) or ATM Mutation Positive or TP53 Mutation Positive Disease Type: Chronic Lymphocytic Leukemia (CLL) Disease Type: Not Applicable Disease Type: Not Applicable Line of Therapy: First Line RAI Stage: IV Treatment Indicated<= Treatment Indicated ATM Mutation Status: Positive 17p Deletion Status: Unknown TP53 Mutation Status: Unknown Intent of Therapy: Non-Curative / Palliative Intent, Discussed with Patient

## 2019-06-21 NOTE — Progress Notes (Signed)
Patient on plan of care prior to pathways. 

## 2019-06-21 NOTE — Assessment & Plan Note (Addendum)
#   CLL/SLL- relapsed most recently on ibrutinib; FEB 2021- CT Ab/Pelvis-CT scan chest and pelvis shows significant progression of the lymphadenopathy-axillary/mediastinal/abdominal/pelvic-however the largest lesion approximately inch in size.  Rising white count-suggestive of progressive disease.  #I again reviewed with the patient regarding starting Gazyva.  Treatments are monthly IV infusions; long hours of infusion.  First month would be more frequent.  Treatments are palliative.  Discussed the potential risk of infusion reactions/side effects including but not limited to increased risk of infections/low blood counts etc.  Also discussed at length regarding-premedications-Tylenol/Claritin; Singulair/dexamethasone prior.  Patient agrees.  #Anemia-hemoglobin ~9 CKD versus progressive leukemia.  Continue Aranesp.  See plan above  #CKD-III-Hyperkalemia vs-pseudohyperkalemia;  labile; discussed with Dr.Lateef.  Today potassium 5.1;   # Abdominal pain/dyspepsia/unclear etiology [no clear evidence of malignancy causing the pain as the largest lymph node is about 2.2 cm in size] -worsening discussed with Josh Borders okay with the long-acting pain medication.  Percocet for now.  S/p celiac plexus block.  # Palliative care evaluation: awaiting palliative care philosophy and services; evluation today.  Discussed with Josh.  # DISPOSITION: #  Aranesp today; # in 1 weeks-MD-- lab- cbc/cmp/LDHDyann Kief d-1; d-2;GaZYVA  #  D-8- MD; labs- cbc/bmp;Gazyva- Dr.B  # I reviewed the blood work- with the patient in detail; also reviewed the imaging independently [as summarized above]; and with the patient in detail.

## 2019-06-21 NOTE — Patient Instructions (Signed)
#   Take pre-medications as recommended prior to next treatment/chemo infusion.    # Take tylenol 325 mg twice a day; Take claritin one a day; start 2 days prior to treatement x 4 days; take along with other prescriptions as recommended;  called to pharmacy   *you will follow the same instructions for ALL SCHEDULED INFUSIONS!      06/27/19- Sunday  Take  tylenol 325 mg 1 TABLET TWICE DAILY  Take Claritin 10 mg once daily- CAN PICK UP OVER THE COUNTER  montelukast (SINGULAIR) 10 MG tablet take daily for a total of 4 days  Dr. B sent this script to the pharmacy  dexamethasone (DECADRON) 4 MG tablet take daily for 2 days prior to your infusion  Dr. B sent this script to the pharmacy  **THIS IS THE STEROID MEDICATION    06/28/19- Monday  Take  tylenol 325 mg twice a day  Take Claritin 10 mg once daily  montelukast (SINGULAIR) 10 MG tablet take daily for a total of 4 days * Dr. B sent this script to the pharmacy  dexamethasone (DECADRON) 4 MG tablet take daily for 2 days prior to your infusion **Dr. B sent this script to the pharmacy **THIS IS THE STEROID MEDICATION   06/29/2019--Tuesday-  The day of your infusion  Take  tylenol 325 mg twice a day  Take Claritin 10 mg once daily  montelukast (SINGULAIR) 10 MG tablet take daily for a total of 4 days * Dr. B sent this script to the pharmacy     Do not take steriods on the day of infusion       06/30/2019-- Wednesday The day of your infusion  Take  tylenol 325 mg twice a day  Take Claritin 10 mg once daily  montelukast (SINGULAIR) 10 MG tablet take daily for a total of 4 days ** Dr. B sent this script to the pharmacy     Do not take steriods on the day of infusion     TAKE acyclovir (ZOVIRAX) 400 MG tablet everyday - This is the medication for shingle prevention!

## 2019-06-22 ENCOUNTER — Ambulatory Visit: Payer: Medicare Other

## 2019-06-22 ENCOUNTER — Other Ambulatory Visit: Payer: Self-pay | Admitting: *Deleted

## 2019-06-22 MED ORDER — POLYSACCHARIDE IRON COMPLEX 150 MG PO CAPS: 150.0000 mg | ORAL_CAPSULE | Freq: Every day | ORAL | 1 refills | Status: DC

## 2019-06-25 ENCOUNTER — Telehealth: Payer: Self-pay | Admitting: Licensed Clinical Social Worker

## 2019-06-25 NOTE — Telephone Encounter (Signed)
Patient called with wife on the line asking about his premedication that he needed to take prior to infusions. Medication schedule explained in detail. The reason he is taking acyclovir as a preventative and not because he has been diagnosed with shingles. I explained to the patient that he needs to take the medication as written out on the AVS for each specific day for him. He and his wife expressed understanding.

## 2019-06-29 ENCOUNTER — Encounter: Payer: Self-pay | Admitting: Internal Medicine

## 2019-06-29 ENCOUNTER — Inpatient Hospital Stay: Payer: Medicare Other

## 2019-06-29 ENCOUNTER — Inpatient Hospital Stay: Payer: Medicare Other | Attending: Internal Medicine

## 2019-06-29 ENCOUNTER — Inpatient Hospital Stay (HOSPITAL_BASED_OUTPATIENT_CLINIC_OR_DEPARTMENT_OTHER): Payer: Medicare Other | Admitting: Hospice and Palliative Medicine

## 2019-06-29 ENCOUNTER — Inpatient Hospital Stay (HOSPITAL_BASED_OUTPATIENT_CLINIC_OR_DEPARTMENT_OTHER): Payer: Medicare Other | Admitting: Internal Medicine

## 2019-06-29 ENCOUNTER — Other Ambulatory Visit: Payer: Self-pay

## 2019-06-29 DIAGNOSIS — Z79899 Other long term (current) drug therapy: Secondary | ICD-10-CM | POA: Diagnosis not present

## 2019-06-29 DIAGNOSIS — Z5112 Encounter for antineoplastic immunotherapy: Secondary | ICD-10-CM | POA: Insufficient documentation

## 2019-06-29 DIAGNOSIS — E875 Hyperkalemia: Secondary | ICD-10-CM | POA: Diagnosis not present

## 2019-06-29 DIAGNOSIS — I129 Hypertensive chronic kidney disease with stage 1 through stage 4 chronic kidney disease, or unspecified chronic kidney disease: Secondary | ICD-10-CM | POA: Insufficient documentation

## 2019-06-29 DIAGNOSIS — Z515 Encounter for palliative care: Secondary | ICD-10-CM | POA: Diagnosis not present

## 2019-06-29 DIAGNOSIS — C9110 Chronic lymphocytic leukemia of B-cell type not having achieved remission: Secondary | ICD-10-CM

## 2019-06-29 DIAGNOSIS — E1122 Type 2 diabetes mellitus with diabetic chronic kidney disease: Secondary | ICD-10-CM | POA: Insufficient documentation

## 2019-06-29 DIAGNOSIS — N183 Chronic kidney disease, stage 3 unspecified: Secondary | ICD-10-CM | POA: Insufficient documentation

## 2019-06-29 DIAGNOSIS — G893 Neoplasm related pain (acute) (chronic): Secondary | ICD-10-CM

## 2019-06-29 LAB — COMPREHENSIVE METABOLIC PANEL
ALT: 10 U/L (ref 0–44)
AST: 11 U/L — ABNORMAL LOW (ref 15–41)
Albumin: 4.7 g/dL (ref 3.5–5.0)
Alkaline Phosphatase: 38 U/L (ref 38–126)
Anion gap: 10 (ref 5–15)
BUN: 43 mg/dL — ABNORMAL HIGH (ref 8–23)
CO2: 21 mmol/L — ABNORMAL LOW (ref 22–32)
Calcium: 9.5 mg/dL (ref 8.9–10.3)
Chloride: 106 mmol/L (ref 98–111)
Creatinine, Ser: 1.42 mg/dL — ABNORMAL HIGH (ref 0.61–1.24)
GFR calc Af Amer: 58 mL/min — ABNORMAL LOW (ref >60)
GFR calc non Af Amer: 50 mL/min — ABNORMAL LOW (ref >60)
Glucose, Bld: 181 mg/dL — ABNORMAL HIGH (ref 70–99)
Potassium: 5.9 mmol/L — ABNORMAL HIGH (ref 3.5–5.1)
Sodium: 137 mmol/L (ref 135–145)
Total Bilirubin: 0.4 mg/dL (ref 0.3–1.2)
Total Protein: 7.3 g/dL (ref 6.5–8.1)

## 2019-06-29 LAB — CBC WITH DIFFERENTIAL/PLATELET
Abs Immature Granulocytes: 0.12 10*3/uL — ABNORMAL HIGH (ref 0.00–0.07)
Basophils Absolute: 0.1 10*3/uL (ref 0.0–0.1)
Basophils Relative: 0 %
Eosinophils Absolute: 0 10*3/uL (ref 0.0–0.5)
Eosinophils Relative: 0 %
HCT: 28.8 % — ABNORMAL LOW (ref 39.0–52.0)
Hemoglobin: 8.7 g/dL — ABNORMAL LOW (ref 13.0–17.0)
Immature Granulocytes: 0 %
Lymphocytes Relative: 73 %
Lymphs Abs: 24.6 10*3/uL — ABNORMAL HIGH (ref 0.7–4.0)
MCH: 33.3 pg (ref 26.0–34.0)
MCHC: 30.2 g/dL (ref 30.0–36.0)
MCV: 110.3 fL — ABNORMAL HIGH (ref 80.0–100.0)
Monocytes Absolute: 1.9 10*3/uL — ABNORMAL HIGH (ref 0.1–1.0)
Monocytes Relative: 5 %
Neutro Abs: 7.6 10*3/uL (ref 1.7–7.7)
Neutrophils Relative %: 22 %
Platelets: 185 10*3/uL (ref 150–400)
RBC: 2.61 MIL/uL — ABNORMAL LOW (ref 4.22–5.81)
RDW: 17.3 % — ABNORMAL HIGH (ref 11.5–15.5)
WBC: 34.3 10*3/uL — ABNORMAL HIGH (ref 4.0–10.5)
nRBC: 0 % (ref 0.0–0.2)

## 2019-06-29 LAB — LACTATE DEHYDROGENASE: LDH: 118 U/L (ref 98–192)

## 2019-06-29 NOTE — Patient Instructions (Addendum)
#   go to Dr.Lateef's [place] for medications to bring your potassium down.   # STOP Zestril    # Take pre-medications as recommended prior to next treatment/chemo infusion.  # Take tylenol 325 mg twice a day; Take claritin one a day; start 2 days prior to treatement x 4 days; take along with other prescriptions as recommended; called to pharmacy  *you will follow the same instructions for ALL SCHEDULED INFUSIONS!    07/06/19- Tuesday  Taketylenol 325 mg 1 TABLET TWICE DAILY  Take Claritin 10 mg once daily- CAN PICK UP OVER THE COUNTER  montelukast (SINGULAIR) 10 MG tablettake daily for a total of 4 days Dr. B sent this script to the pharmacy  dexamethasone (DECADRON) 4 MG tablettake daily for 2 days prior to your infusion Dr. B sent this script to the pharmacy **THIS IS THE STEROID MEDICATION   07/07/19- Wednesday  Taketylenol 325 mg twice a day  Take Claritin 10 mg once daily  montelukast (SINGULAIR) 10 MG tablettake daily for a total of 4 days * Dr. B sent this script to the pharmacy  dexamethasone (DECADRON) 4 MG tablettake daily for 2 days prior to your infusion **Dr. B sent this script to the pharmacy **THIS IS [place] [facility]  07/08/2019--Thursday-  The day of your infusion  Taketylenol 325 mg twice a day  Take Claritin 10 mg once daily  montelukast (SINGULAIR) 10 MG tablettake daily for a total of 4 days * Dr. B sent this script to the pharmacy   Do not takesteriodson the day of infusion    07/09/2019-- Friday The day of your infusion  Taketylenol 325 mg twice a day  Take Claritin 10 mg once daily  montelukast (SINGULAIR) 10 MG tablettake daily for a total of 4 days ** Dr. B sent this script to the pharmacy   Do not takesteriodson the day of infusion   TAKEacyclovir (ZOVIRAX) 400 MG tablet everyday - This is the medication for shingle prevention!

## 2019-06-29 NOTE — Assessment & Plan Note (Addendum)
#   CLL/SLL- relapsed most recently on ibrutinib; FEB 2021- CT Ab/Pelvis-CT scan chest and pelvis shows significant progression of axillary/mediastinal/abdominal/pelvic-however the largest lesion approximately inch in size.  Rising white count-suggestive of progressive disease.  Plan Gazyva.  #Hold Gazyva treatment this week-because of elevated potassium 5.9 [see below].  We will plan starting next week.  As the patient not to pursue rest of the premedications as is treatment as well.  #Hyperkalemia/CKD-III-secondary renal insufficiency/creatinine 1.6- [baseline 1.4].  Discussed with Dr. Latif-recommends potassium lowering agents lokelma.  Patient has still pick up the samples from Dr. Elwyn Lade office/[place] today.  #Anemia-hemoglobin ~8-9 CKD versus progressive leukemia.  Hold Aranesp for now  # Abdominal pain/dyspepsia/unclear etiology [no clear evidence of malignancy causing the pain as the largest lymph node is about 2.2 cm in size] -continue pain medication/followed by palliative care.  # Palliative care evaluation: awaiting palliative care philosophy and services; evluation today.  Discussed with Josh.  # DISPOSITION: #  HOLD Gazya this week/ cancel appt # in 1 weeks-MD-- lab- cbc/cmp/LDH- Gazyva d-1; d-2;GaZYVA-  #  D-8- MD; labs- cbc/bmp;Gazyva- Dr.B

## 2019-06-29 NOTE — Progress Notes (Signed)
Should there cone [place] OFFICE PROGRESS NOTE  Patient Care Team: [name], MD as PCP - General (Internal Medicine)  Cancer Staging No matching staging information was found for the patient.   Oncology History Overview Note  # 2006- CLL STAGE IV; MAY 2011- WBC- 57K;Platelets-99;Hb-12/CT Bulky LN; BMBx- 80% Invol; del 11; START Benda-Ritux x4 [finished Sep 2011];   # July 2015-Progression; Sep 2015-START ibrutinib; CT scan DEC 2015- Improvement LN; Cont Ibrutinib 118m/d; NOV 2016 CT- 1-2CM LN [mild progression compared to Dec 2015];NOV 2016- FISH peripheral blood- NO MUTATIONS/CD-38 Positive; NOV 7th- CONT IBRUTINIB 2 pills/day; CT AUG 2017- STABLE;  DEC 6th PET- Mild RP LN/ Retrocrural LN  # OFF ibrutinib [? intol]- sep 2019- Jan 16th 2020; Re-start Ibrutinib; September 2020-stop ibrutinib [poor tolerance/worsening anemia]  # Jan 16th 2020- start aranesp  # FEB 2021-[name]  # [place]  # PALLIATIVE CARE- 06/21/2019-  # October 2019-bone marrow biopsy [worsening anemia]-question dyserythropoietic changes/small clone of CLL;  # [facility]- NEG.  SA skin infection [Oct 2016] s/p clinda -------------------------------------------------------    DIAGNOSIS: CLL  STAGE:   IV      ;GOALS: palliative  CURRENT/MOST RECENT THERAPY : Gazyva [C]   [place]  06/29/2019 -  Chemotherapy   The patient had obinutuzumab (GAZYVA) 100 mg in sodium chloride 0.9 % 100 mL (0.9615 mg/mL) chemo infusion, 100 mg, Intravenous, Once, 0 of 6 cycles  for chemotherapy treatment.      INTERVAL HISTORY:  FGeorgette Dover89y.o.  male pleasant patient above progressive CLL [currently off ibrutinib] and chronic abdominal pain of unclear etiology; worsening anemia of unclear etiology [? CKD/hyperkalemia] is here for follow-up/proceed with chemotherapy.  Continues to feel poorly.  Continues to lose weight.  Continues to complain  of chronic epigastric pain abdominal discomfort.  Positive for nausea no vomiting.  Review of Systems  Constitutional: Positive for malaise/fatigue and weight loss. Negative for chills, diaphoresis and fever.  HENT: Negative for nosebleeds and sore throat.   Eyes: Negative for double vision.  Respiratory: Negative for cough, hemoptysis, sputum production, shortness of breath and wheezing.   Cardiovascular: Negative for chest pain, palpitations, orthopnea and leg swelling.  Gastrointestinal: Positive for abdominal pain, constipation and nausea. Negative for blood in stool, diarrhea, heartburn, melena and vomiting.  Genitourinary: Negative for dysuria, frequency and urgency.  Skin: Negative.  Negative for itching and rash.  Neurological: Negative for dizziness, tingling, focal weakness, weakness and headaches.  Endo/Heme/Allergies: Does not bruise/bleed easily.  Psychiatric/Behavioral: Negative for depression. The patient is not nervous/anxious and does not have insomnia.       PAST MEDICAL HISTORY :  Past Medical History:  Diagnosis Date  . [place]   . Depression   . Diabetes mellitus without complication [place]   . Hematuria   . Hyperlipidemia   . Hypertension     PAST SURGICAL HISTORY :   Past Surgical History:  Procedure Laterality Date  . CATARACT EXTRACTION W/ INTRAOCULAR LENS IMPLANT Bilateral   . CHOLECYSTECTOMY  1983  . COLONOSCOPY WITH PROPOFOL N/A 10/27/2017   Procedure: COLONOSCOPY WITH PROPOFOL;  Surgeon: EManya Silvas MD;  [facility];  Service: Endoscopy;  Laterality: N/A;  . ESOPHAGOGASTRODUODENOSCOPY (EGD) WITH PROPOFOL N/A 11/20/2016   Procedure: ESOPHAGOGASTRODUODENOSCOPY (EGD) WITH PROPOFOL;  Surgeon: EManya Silvas MD;  [facility];  Service: Endoscopy;  Laterality: N/A;  . ESOPHAGOGASTRODUODENOSCOPY (EGD) WITH PROPOFOL N/A 10/27/2017  Procedure: ESOPHAGOGASTRODUODENOSCOPY (EGD) WITH PROPOFOL;   Surgeon: Manya Silvas, MD;  [facility]COPY;  Service: Endoscopy;  Laterality: N/A;  . LOWER EXTREMITY ANGIOGRAPHY Right 05/19/2017   Procedure: LOWER EXTREMITY ANGIOGRAPHY;  Surgeon: Algernon Huxley, MD;  Location: [place] CV LAB;  Service: Cardiovascular;  Laterality: Right;  . LOWER EXTREMITY ANGIOGRAPHY Left 08/10/2018   Procedure: LOWER EXTREMITY ANGIOGRAPHY;  Surgeon: Algernon Huxley, MD;  Location: [place] CV LAB;  Service: Cardiovascular;  Laterality: Left;    FAMILY HISTORY :   Family History  Problem Relation Age of Onset  . Hypertension Sister     SOCIAL HISTORY:   Social History   Tobacco Use  . Smoking status: Current Every Day Smoker    Packs/day: 0.50    Years: 47.00    Pack years: 23.50    Types: Cigarettes  . Smokeless tobacco: Never Used  Substance Use Topics  . Alcohol use: Yes    Alcohol/week: 1.0 standard drinks    Types: 1 Glasses of wine per week    Comment:  almost none in last 6 months  . Drug use: No    ALLERGIES:  has No Known Allergies.  MEDICATIONS:  Current Outpatient Medications  Medication Sig Dispense Refill  . acyclovir (ZOVIRAX) 400 MG tablet One pill a day [to prevent shingles] (Patient not taking: Reported on 06/14/2019) 30 tablet 3  . aspirin EC 81 MG tablet Take 81 mg by mouth daily.    Marland Kitchen atorvastatin (LIPITOR) 10 MG tablet Take 1 tablet (10 mg total) by mouth daily. 30 tablet 11  . Calcium Carb-Cholecalciferol (CALCIUM PLUS D3 ABSORBABLE) [phone] MG-UNIT CAPS Take 1 capsule by mouth 2 (two) times daily with a meal. 60 capsule 5  . Cholecalciferol (VITAMIN D3) 125 MCG (5000 UT) CAPS Take 1 capsule (5,000 Units total) by mouth daily with breakfast. Take along with calcium and magnesium. (Patient not taking: Reported on 06/14/2019) 30 capsule 5  . clopidogrel (PLAVIX) 75 MG tablet Take 1 tablet by mouth once daily 90 tablet 0  . dexamethasone (DECADRON) 4 MG tablet Start 2 days prior to infusion; Take for 2 days. Do not  take on the day of infusion. (Patient not taking: Reported on 06/21/2019) 60 tablet 3  . Ensure (ENSURE) Take 237 mLs by mouth.    . ergocalciferol (VITAMIN D2) 1.25 MG (50000 UT) capsule Take 1 capsule (50,000 Units total) by mouth 2 (two) times a week. X 6 weeks. 12 capsule 0  . folic acid (FOLVITE) 1 MG tablet Take 1 tablet (1 mg total) by mouth daily. 30 tablet 1  . iron polysaccharides (NU-IRON) 150 MG capsule Take 1 capsule (150 mg total) by mouth daily. 30 capsule 1  . lisinopril (ZESTRIL) 10 MG tablet Take 1 tablet (10 mg total) by mouth daily. 30 tablet 0  . metFORMIN (GLUCOPHAGE) 1000 MG tablet Take 1,000 mg by mouth daily with breakfast.     . montelukast (SINGULAIR) 10 MG tablet Take 1 tablet (10 mg total) by mouth at bedtime. Start 2 days prior to infusion. Take it for 4 days. (Patient not taking: Reported on 06/14/2019) 60 tablet 0  . morphine (MS CONTIN) 15 MG 12 hr tablet Take 1 tablet (15 mg total) by mouth every 12 (twelve) hours. 30 tablet 0  . oxyCODONE-acetaminophen (PERCOCET/ROXICET) 5-325 MG tablet 1 pill every 8-12 hours 90 tablet 0  . sodium polystyrene (KAYEXALATE) 15 GM/60ML suspension Take 60 mLs (15 g total) by mouth every 6 (six) hours.  Until 2 loose stools (Patient not taking: Reported on 06/14/2019) 240 mL 0   No current facility-administered medications for this visit.    PHYSICAL EXAMINATION: ECOG PERFORMANCE STATUS: 1 - Symptomatic but completely ambulatory  BP 132/82 (BP Location: Left Arm, Patient Position: Sitting, Cuff Size: Normal)   Pulse 75   Temp (!) 97.1 F (36.2 C) (Tympanic)   Wt 147 lb (66.7 kg)   BMI 22.35 kg/m   Filed Weights   06/29/19 0830  Weight: 147 lb (66.7 kg)    Physical Exam  Constitutional: He is oriented to person, place, and time and well-developed, well-nourished, and in no distress.  He is alone.  Appears pale.  HENT:  Head: Normocephalic and atraumatic.  Mouth/Throat: Oropharynx is clear and moist. No oropharyngeal  exudate.  Eyes: Pupils are equal, round, and reactive to light.  Cardiovascular: Normal rate and regular rhythm.  Pulmonary/Chest: No respiratory distress. He has no wheezes.  Decreased air entry bil.   Abdominal: Soft. Bowel sounds are normal. He exhibits no distension and no mass. There is no abdominal tenderness. There is no rebound and no guarding.  Musculoskeletal:        General: No tenderness or edema. Normal range of motion.     Cervical back: Normal range of motion and neck supple.  Neurological: He is alert and oriented to person, place, and time.  Skin: Skin is warm. There is pallor.  Multiple bruises noted in bilateral upper extremity.  Psychiatric: Affect normal.      LABORATORY DATA:  I have reviewed the data as listed    Component Value Date/Time   NA 137 06/29/2019 0804   NA 142 05/03/2014 1139   K 5.9 (H) 06/29/2019 0804   K 4.7 05/03/2014 1139   CL 106 06/29/2019 0804   CL 110 (H) 05/03/2014 1139   CO2 21 (L) 06/29/2019 0804   CO2 24 05/03/2014 1139   GLUCOSE 181 (H) 06/29/2019 0804   GLUCOSE 98 05/03/2014 1139   BUN 43 (H) 06/29/2019 0804   BUN 20 (H) 05/03/2014 1139   CREATININE 1.42 (H) 06/29/2019 0804   CREATININE 1.22 08/09/2014 1122   CALCIUM 9.5 06/29/2019 0804   CALCIUM 8.6 05/03/2014 1139   PROT 7.3 06/29/2019 0804   PROT 7.5 05/03/2014 1139   ALBUMIN 4.7 06/29/2019 0804   ALBUMIN 4.1 05/03/2014 1139   AST 11 (L) 06/29/2019 0804   AST 15 05/03/2014 1139   ALT 10 06/29/2019 0804   ALT 26 05/03/2014 1139   ALKPHOS 38 06/29/2019 0804   ALKPHOS 94 05/03/2014 1139   BILITOT 0.4 06/29/2019 0804   BILITOT 0.5 05/03/2014 1139   GFRNONAA 50 (L) 06/29/2019 0804   GFRNONAA >60 08/09/2014 1122   GFRAA 58 (L) 06/29/2019 0804   GFRAA >60 08/09/2014 1122    No results found for: SPEP, UPEP  Lab Results  Component Value Date   WBC 34.3 (H) 06/29/2019   NEUTROABS 7.6 06/29/2019   HGB 8.7 (L) 06/29/2019   HCT 28.8 (L) 06/29/2019   MCV 110.3 (H)  06/29/2019   PLT 185 06/29/2019      Chemistry      Component Value Date/Time   NA 137 06/29/2019 0804   NA 142 05/03/2014 1139   K 5.9 (H) 06/29/2019 0804   K 4.7 05/03/2014 1139   CL 106 06/29/2019 0804   CL 110 (H) 05/03/2014 1139   CO2 21 (L) 06/29/2019 0804   CO2 24 05/03/2014 1139   BUN 43 (H)  06/29/2019 0804   BUN 20 (H) 05/03/2014 1139   CREATININE 1.42 (H) 06/29/2019 0804   CREATININE 1.22 08/09/2014 1122      Component Value Date/Time   CALCIUM 9.5 06/29/2019 0804   CALCIUM 8.6 05/03/2014 1139   ALKPHOS 38 06/29/2019 0804   ALKPHOS 94 05/03/2014 1139   AST 11 (L) 06/29/2019 0804   AST 15 05/03/2014 1139   ALT 10 06/29/2019 0804   ALT 26 05/03/2014 1139   BILITOT 0.4 06/29/2019 0804   BILITOT 0.5 05/03/2014 1139       RADIOGRAPHIC STUDIES: I have personally reviewed the radiological images as listed and agreed with the findings in the report. No results found.   ASSESSMENT & PLAN:  CLL (chronic lymphocytic leukemia) ([place]) # CLL/SLL- relapsed most recently on ibrutinib; FEB 2021- CT Ab/Pelvis-CT scan chest and pelvis shows significant progression of axillary/mediastinal/abdominal/pelvic-however the largest lesion approximately inch in size.  Rising white count-suggestive of progressive disease.  Plan Gazyva.  #Hold Gazyva treatment this week-because of elevated potassium 5.9 [see below].  We will plan starting next week.  As the patient not to pursue rest of the premedications as is treatment as well.  #Hyperkalemia/CKD-III-secondary renal insufficiency/creatinine 1.6- [baseline 1.4].  Discussed with Dr. Latif-recommends potassium lowering agents lokelma.  Patient has still pick up the samples from Dr. Elwyn Lade office/[place] today.  #Anemia-hemoglobin ~8-9 CKD versus progressive leukemia.  Hold Aranesp for now  # Abdominal pain/dyspepsia/unclear etiology [no clear evidence of malignancy causing the pain as the largest lymph node is about 2.2 cm in size]  -continue pain medication/followed by palliative care.  # Palliative care evaluation: awaiting palliative care philosophy and services; evluation today.  Discussed with Josh.  # DISPOSITION: #  HOLD Gazya this week/ cancel appt # in 1 weeks-MD-- lab- cbc/cmp/LDHDyann Kief d-1; d-2;GaZYVA-  #  D-8- MD; labs- cbc/bmp;Gazyva- Dr.B       Orders Placed This Encounter  Procedures  . CBC with Differential    Standing Status:   Future    Standing Expiration Date:   06/28/2020  . Comprehensive metabolic panel    Standing Status:   Future    Standing Expiration Date:   06/28/2020  . Lactate dehydrogenase    Standing Status:   Future    Standing Expiration Date:   06/28/2020   All questions were answered. The patient knows to call the clinic with any problems, questions or concerns.      Cammie Sickle, MD 06/29/2019 10:21 AM

## 2019-06-29 NOTE — Progress Notes (Signed)
[place]  [phone] [phone]   Name: BOLTON CANUPP Date: 06/29/2019 MRN: [id number]  DOB: 1950-07-15  Patient Care Team: [name], MD as PCP - General (Internal Medicine)    REASON FOR CONSULTATION: SELIM DURDEN is a 69 y.o. male with multiple medical problems including stage IV CLL (initially diagnosed in 2006) most recently treated with ibrutinib but was discontinued due to poor tolerance. He is now on treatment with Gazyva.  Patient has had severe and chronic abdominal pain of unclear etiology.  He has had extensive work-up including referral to [facility].  He has been followed by interventional management in the status post celiac plexus blocks.  Patient has continued to lose weight.  He was referred to palliative care to help address goals and manage ongoing symptoms.  SOCIAL HISTORY:     reports that he has been smoking cigarettes. He has a 23.50 pack-year smoking history. He has never used smokeless tobacco. He reports current alcohol use of about 1.0 standard drinks of alcohol per week. He reports that he does not use drugs.  Patient is married and lives at home with his wife.  He has a son in [place].  Patient formally worked as a Administrator.  ADVANCE DIRECTIVES:  Does not have  CODE STATUS:   PAST MEDICAL HISTORY: Past Medical History:  Diagnosis Date  . CLL (chronic lymphocytic leukemia) ([place])   . Depression   . Diabetes mellitus without complication ([place])   . Hematuria   . Hyperlipidemia   . Hypertension     PAST SURGICAL HISTORY:  Past Surgical History:  Procedure Laterality Date  . CATARACT EXTRACTION W/ INTRAOCULAR LENS IMPLANT Bilateral   . CHOLECYSTECTOMY  1983  . COLONOSCOPY WITH PROPOFOL N/A 10/27/2017   Procedure: COLONOSCOPY WITH PROPOFOL;  Surgeon: Manya Silvas, MD;  Location: [facility] ENDOSCOPY;  Service: Endoscopy;  Laterality: N/A;  . ESOPHAGOGASTRODUODENOSCOPY  (EGD) WITH PROPOFOL N/A 11/20/2016   Procedure: ESOPHAGOGASTRODUODENOSCOPY (EGD) WITH PROPOFOL;  Surgeon: Manya Silvas, MD;  Location: [facility] ENDOSCOPY;  Service: Endoscopy;  Laterality: N/A;  . ESOPHAGOGASTRODUODENOSCOPY (EGD) WITH PROPOFOL N/A 10/27/2017   Procedure: ESOPHAGOGASTRODUODENOSCOPY (EGD) WITH PROPOFOL;  Surgeon: Manya Silvas, MD;  Location: [facility] ENDOSCOPY;  Service: Endoscopy;  Laterality: N/A;  . LOWER EXTREMITY ANGIOGRAPHY Right 05/19/2017   Procedure: LOWER EXTREMITY ANGIOGRAPHY;  Surgeon: Algernon Huxley, MD;  Location: [place] CV LAB;  Service: Cardiovascular;  Laterality: Right;  . LOWER EXTREMITY ANGIOGRAPHY Left 08/10/2018   Procedure: LOWER EXTREMITY ANGIOGRAPHY;  Surgeon: Algernon Huxley, MD;  Location: [place] CV LAB;  Service: Cardiovascular;  Laterality: Left;    HEMATOLOGY/ONCOLOGY HISTORY:  Oncology History Overview Note  # 2006- CLL STAGE IV; MAY 2011- WBC- 57K;Platelets-99;Hb-12/CT Bulky LN; BMBx- 80% Invol; del 11; START Benda-Ritux x4 [finished Sep 2011];   # July 2015-Progression; Sep 2015-START ibrutinib; CT scan DEC 2015- Improvement LN; Cont Ibrutinib 189m/d; NOV 2016 CT- 1-2CM LN [mild progression compared to Dec 2015];NOV 2016- FISH peripheral blood- NO MUTATIONS/CD-38 Positive; NOV 7th- CONT IBRUTINIB 2 pills/day; CT AUG 2017- STABLE;  DEC 6th PET- Mild RP LN/ Retrocrural LN  # OFF ibrutinib [? intol]- sep 2019- Jan 16th 2020; Re-start Ibrutinib; September 2020-stop ibrutinib [poor tolerance/worsening anemia]  # Jan 16th 2020- start aranesp  # FEB 2021-[name]  # DElgin2019- PAIN CONTRACT  # PALLIATIVE CARE- 06/21/2019-  # October 2019-bone marrow biopsy [worsening anemia]-question dyserythropoietic changes/small clone of CLL;  #  [facility] HEM- NEG.  SA skin infection [Oct 2016] s/p clinda -------------------------------------------------------    DIAGNOSIS: CLL  STAGE:   IV      ;GOALS: palliative  CURRENT/MOST RECENT THERAPY  : Gazyva [C]   CLL (chronic lymphocytic leukemia) ([place])  06/29/2019 -  Chemotherapy   The patient had obinutuzumab (GAZYVA) 100 mg in sodium chloride 0.9 % 100 mL (0.9615 mg/mL) chemo infusion, 100 mg, Intravenous, Once, 0 of 6 cycles  for chemotherapy treatment.      ALLERGIES:  has No Known Allergies.  MEDICATIONS:  Current Outpatient Medications  Medication Sig Dispense Refill  . acyclovir (ZOVIRAX) 400 MG tablet One pill a day [to prevent shingles] (Patient not taking: Reported on 06/14/2019) 30 tablet 3  . aspirin EC 81 MG tablet Take 81 mg by mouth daily.    Marland Kitchen atorvastatin (LIPITOR) 10 MG tablet Take 1 tablet (10 mg total) by mouth daily. 30 tablet 11  . Calcium Carb-Cholecalciferol (CALCIUM PLUS D3 ABSORBABLE) [id number] MG-UNIT CAPS Take 1 capsule by mouth 2 (two) times daily with a meal. 60 capsule 5  . Cholecalciferol (VITAMIN D3) 125 MCG (5000 UT) CAPS Take 1 capsule (5,000 Units total) by mouth daily with breakfast. Take along with calcium and magnesium. (Patient not taking: Reported on 06/14/2019) 30 capsule 5  . clopidogrel (PLAVIX) 75 MG tablet Take 1 tablet by mouth once daily 90 tablet 0  . dexamethasone (DECADRON) 4 MG tablet Start 2 days prior to infusion; Take for 2 days. Do not take on the day of infusion. (Patient not taking: Reported on 06/21/2019) 60 tablet 3  . Ensure (ENSURE) Take 237 mLs by mouth.    . ergocalciferol (VITAMIN D2) 1.25 MG (50000 UT) capsule Take 1 capsule (50,000 Units total) by mouth 2 (two) times a week. X 6 weeks. 12 capsule 0  . folic acid (FOLVITE) 1 MG tablet Take 1 tablet (1 mg total) by mouth daily. 30 tablet 1  . iron polysaccharides (NU-IRON) 150 MG capsule Take 1 capsule (150 mg total) by mouth daily. 30 capsule 1  . lisinopril (ZESTRIL) 10 MG tablet Take 1 tablet (10 mg total) by mouth daily. 30 tablet 0  . metFORMIN (GLUCOPHAGE) 1000 MG tablet Take 1,000 mg by mouth daily with breakfast.     . montelukast (SINGULAIR) 10 MG tablet Take 1  tablet (10 mg total) by mouth at bedtime. Start 2 days prior to infusion. Take it for 4 days. (Patient not taking: Reported on 06/14/2019) 60 tablet 0  . morphine (MS CONTIN) 15 MG 12 hr tablet Take 1 tablet (15 mg total) by mouth every 12 (twelve) hours. 30 tablet 0  . oxyCODONE-acetaminophen (PERCOCET/ROXICET) 5-325 MG tablet 1 pill every 8-12 hours 90 tablet 0  . sodium polystyrene (KAYEXALATE) 15 GM/60ML suspension Take 60 mLs (15 g total) by mouth every 6 (six) hours. Until 2 loose stools (Patient not taking: Reported on 06/14/2019) 240 mL 0   No current facility-administered medications for this visit.    VITAL SIGNS: There were no vitals taken for this visit. There were no vitals filed for this visit.  Estimated body mass index is 22.35 kg/m as calculated from the following:   Height as of 06/07/19: 5' 8"  (1.727 m).   Weight as of an earlier encounter on 06/29/19: 147 lb (66.7 kg).  LABS: CBC:    Component Value Date/Time   WBC 34.3 (H) 06/29/2019 0804   HGB 8.7 (L) 06/29/2019 0804   HGB 11.4 (L) 08/09/2014 1122  HCT 28.8 (L) 06/29/2019 0804   HCT 35.6 (L) 08/09/2014 1122   PLT 185 06/29/2019 0804   PLT 95 (L) 08/09/2014 1122   MCV 110.3 (H) 06/29/2019 0804   MCV 100 08/09/2014 1122   NEUTROABS 7.6 06/29/2019 0804   NEUTROABS 4.3 08/09/2014 1122   LYMPHSABS 24.6 (H) 06/29/2019 0804   LYMPHSABS 6.5 (H) 08/09/2014 1122   MONOABS 1.9 (H) 06/29/2019 0804   MONOABS 1.1 (H) 08/09/2014 1122   EOSABS 0.0 06/29/2019 0804   EOSABS 0.1 08/09/2014 1122   BASOSABS 0.1 06/29/2019 0804   BASOSABS 0.1 08/09/2014 1122   Comprehensive Metabolic Panel:    Component Value Date/Time   NA 137 06/29/2019 0804   NA 142 05/03/2014 1139   K 5.9 (H) 06/29/2019 0804   K 4.7 05/03/2014 1139   CL 106 06/29/2019 0804   CL 110 (H) 05/03/2014 1139   CO2 21 (L) 06/29/2019 0804   CO2 24 05/03/2014 1139   BUN 43 (H) 06/29/2019 0804   BUN 20 (H) 05/03/2014 1139   CREATININE 1.42 (H) 06/29/2019 0804    CREATININE 1.22 08/09/2014 1122   GLUCOSE 181 (H) 06/29/2019 0804   GLUCOSE 98 05/03/2014 1139   CALCIUM 9.5 06/29/2019 0804   CALCIUM 8.6 05/03/2014 1139   AST 11 (L) 06/29/2019 0804   AST 15 05/03/2014 1139   ALT 10 06/29/2019 0804   ALT 26 05/03/2014 1139   ALKPHOS 38 06/29/2019 0804   ALKPHOS 94 05/03/2014 1139   BILITOT 0.4 06/29/2019 0804   BILITOT 0.5 05/03/2014 1139   PROT 7.3 06/29/2019 0804   PROT 7.5 05/03/2014 1139   ALBUMIN 4.7 06/29/2019 0804   ALBUMIN 4.1 05/03/2014 1139    RADIOGRAPHIC STUDIES: CT Abdomen Pelvis Wo Contrast  Result Date: 06/18/2019 CLINICAL DATA:  Restaging CLL. Initial diagnosis 2011. Decreased appetite and weight loss over the past 3-4 months. EXAM: CT CHEST, ABDOMEN AND PELVIS WITHOUT CONTRAST TECHNIQUE: Multidetector CT imaging of the chest, abdomen and pelvis was performed following the standard protocol without IV contrast. COMPARISON:  CT scan 11/17/2018 FINDINGS: CT CHEST FINDINGS Cardiovascular: The heart is normal in size. No pericardial effusion. There is tortuosity and moderate atherosclerotic calcification involving the thoracic aorta. No focal aneurysm. Branch vessel calcifications including three-vessel coronary artery calcifications are again noted. Mediastinum/Nodes: Lower neck, supraclavicular, supraclavicular fossa, subpectoral and bilateral axillary adenopathy. Left supraclavicular lymph node on image 1/2 measures 11 mm. I do not see this on the prior study. Right subpectoral lymph node on image 11/2 measures 13 mm. This measures 6 mm on the prior study. Right axillary lymph node on image 14/2 measures 18.5 mm and previously measured 9 mm. 17 mm left axillary lymph node on image 15/2 previously measured 10 mm. 10.5 mm precarinal lymph node on image 27/2 previously measured 6 mm. AP window node on image 24/2 measures 9.5 mm and previously measured 5.5 mm. Lungs/Pleura: Stable mild emphysematous changes. No acute pulmonary findings. No  worrisome pulmonary lesions. No enlarged pleural nodes. No pleural effusion. Musculoskeletal: No chest wall mass. The thyroid gland appears normal. The bony structures are unremarkable. Mild osteoporosis. CT ABDOMEN PELVIS FINDINGS Hepatobiliary: No focal hepatic lesions are identified without contrast. Gallbladder is surgically absent. No common bile duct dilatation. Pancreas: No mass, inflammation or ductal dilatation. Spleen: Mild splenomegaly. The spleen measures 12.0 x 9.5 x 8.0 cm. No splenic lesions. Adrenals/Urinary Tract: The adrenal glands and kidneys are unremarkable. There are small bilateral renal calculi and bilateral renal cysts. No worrisome renal lesions.  The bladder is unremarkable. Stomach/Bowel: The stomach, duodenum, small bowel and colon are unremarkable. No acute inflammatory changes, mass lesions or obstructive findings. The terminal ileum and appendix are normal. Vascular/Lymphatic: Advanced atherosclerotic calcifications involving the aorta and branch vessels. Bilateral iliac artery stents are noted. Enlarging mesenteric and retroperitoneal lymph nodes. 10.5 mm mesenteric node on image 73/2 is new. Left-sided retroperitoneal nodal mass measures 3.4 x 2.3 cm on image 79/2. This previously measured less than 1 cm. Right-sided retroperitoneal adenopathy is also noted. Small bilateral pelvic sidewall lymph nodes are noted. 11 mm obturator lymph node on image 117/2 is new. No inguinal adenopathy. Reproductive: The prostate gland is enlarged. There is median lobe hypertrophy impressing on the base the bladder. The seminal vesicles appear normal. Other: No free pelvic fluid collections. No inguinal hernia. Musculoskeletal: No significant bony findings. IMPRESSION: 1. Significant progression of lymphadenopathy in the lower neck, chest, abdomen and pelvis as detailed above. 2. Mild splenomegaly. 3. Advanced atherosclerotic calcifications involving the thoracic and abdominal aorta and branch vessels  including three-vessel coronary artery calcifications. 4. Bilateral renal calculi and renal cysts. 5. Enlarged prostate gland with median lobe hypertrophy impressing on the base the bladder. Aortic Atherosclerosis (ICD10-I70.0). Electronically Signed   By: Marijo Sanes M.D.   On: 06/18/2019 14:45   DG Thoracic Spine 2 View  Result Date: 06/16/2019 CLINICAL DATA:  Upper back pain for several months, no known injury, initial encounter EXAM: THORACIC SPINE 2 VIEWS COMPARISON:  None. FINDINGS: Vertebral body height is well maintained. Mild osteophytes are noted. Pedicles are within normal limits and no paraspinal mass lesion is seen. No soft tissue abnormality or rib abnormality is seen. IMPRESSION: Degenerative change without acute abnormality. Electronically Signed   By: Inez Catalina M.D.   On: 06/16/2019 16:15   DG Lumbar Spine Complete W/Bend  Result Date: 06/16/2019 CLINICAL DATA:  Low back pain for several months EXAM: LUMBAR SPINE - COMPLETE WITH BENDING VIEWS COMPARISON:  11/17/2018 FINDINGS: Five lumbar type vertebral bodies are well visualized. Vertebral body height is well maintained. No pars defects are seen. Facet hypertrophic changes are noted. No anterolisthesis is seen. Flexion and extension views show no significant instability. Bilateral iliac stenting is noted. Aortic calcifications are seen without aneurysmal dilatation. IMPRESSION: Degenerative change without instability. Electronically Signed   By: Inez Catalina M.D.   On: 06/16/2019 16:14   CT CHEST WO CONTRAST  Result Date: 06/18/2019 CLINICAL DATA:  Restaging CLL. Initial diagnosis 2011. Decreased appetite and weight loss over the past 3-4 months. EXAM: CT CHEST, ABDOMEN AND PELVIS WITHOUT CONTRAST TECHNIQUE: Multidetector CT imaging of the chest, abdomen and pelvis was performed following the standard protocol without IV contrast. COMPARISON:  CT scan 11/17/2018 FINDINGS: CT CHEST FINDINGS Cardiovascular: The heart is normal in  size. No pericardial effusion. There is tortuosity and moderate atherosclerotic calcification involving the thoracic aorta. No focal aneurysm. Branch vessel calcifications including three-vessel coronary artery calcifications are again noted. Mediastinum/Nodes: Lower neck, supraclavicular, supraclavicular fossa, subpectoral and bilateral axillary adenopathy. Left supraclavicular lymph node on image 1/2 measures 11 mm. I do not see this on the prior study. Right subpectoral lymph node on image 11/2 measures 13 mm. This measures 6 mm on the prior study. Right axillary lymph node on image 14/2 measures 18.5 mm and previously measured 9 mm. 17 mm left axillary lymph node on image 15/2 previously measured 10 mm. 10.5 mm precarinal lymph node on image 27/2 previously measured 6 mm. AP window node on image 24/2 measures 9.5  mm and previously measured 5.5 mm. Lungs/Pleura: Stable mild emphysematous changes. No acute pulmonary findings. No worrisome pulmonary lesions. No enlarged pleural nodes. No pleural effusion. Musculoskeletal: No chest wall mass. The thyroid gland appears normal. The bony structures are unremarkable. Mild osteoporosis. CT ABDOMEN PELVIS FINDINGS Hepatobiliary: No focal hepatic lesions are identified without contrast. Gallbladder is surgically absent. No common bile duct dilatation. Pancreas: No mass, inflammation or ductal dilatation. Spleen: Mild splenomegaly. The spleen measures 12.0 x 9.5 x 8.0 cm. No splenic lesions. Adrenals/Urinary Tract: The adrenal glands and kidneys are unremarkable. There are small bilateral renal calculi and bilateral renal cysts. No worrisome renal lesions. The bladder is unremarkable. Stomach/Bowel: The stomach, duodenum, small bowel and colon are unremarkable. No acute inflammatory changes, mass lesions or obstructive findings. The terminal ileum and appendix are normal. Vascular/Lymphatic: Advanced atherosclerotic calcifications involving the aorta and branch vessels.  Bilateral iliac artery stents are noted. Enlarging mesenteric and retroperitoneal lymph nodes. 10.5 mm mesenteric node on image 73/2 is new. Left-sided retroperitoneal nodal mass measures 3.4 x 2.3 cm on image 79/2. This previously measured less than 1 cm. Right-sided retroperitoneal adenopathy is also noted. Small bilateral pelvic sidewall lymph nodes are noted. 11 mm obturator lymph node on image 117/2 is new. No inguinal adenopathy. Reproductive: The prostate gland is enlarged. There is median lobe hypertrophy impressing on the base the bladder. The seminal vesicles appear normal. Other: No free pelvic fluid collections. No inguinal hernia. Musculoskeletal: No significant bony findings. IMPRESSION: 1. Significant progression of lymphadenopathy in the lower neck, chest, abdomen and pelvis as detailed above. 2. Mild splenomegaly. 3. Advanced atherosclerotic calcifications involving the thoracic and abdominal aorta and branch vessels including three-vessel coronary artery calcifications. 4. Bilateral renal calculi and renal cysts. 5. Enlarged prostate gland with median lobe hypertrophy impressing on the base the bladder. Aortic Atherosclerosis (ICD10-I70.0). Electronically Signed   By: Marijo Sanes M.D.   On: 06/18/2019 14:45   DG PAIN CLINIC C-ARM 1-60 MIN NO REPORT  Result Date: 06/01/2019 Fluoro was used, but no Radiologist interpretation will be provided. Please refer to "NOTES" tab for provider progress note.   PERFORMANCE STATUS (ECOG) : 1 - Symptomatic but completely ambulatory  Review of Systems Unless otherwise noted, a complete review of systems is negative.  Physical Exam General: NAD, frail appearing, thin Pulmonary: Unlabored Extremities: no edema, no joint deformities Skin: no rashes Neurological: Weakness but otherwise nonfocal  IMPRESSION: Routine follow-up visit today.  Patient reports her pain is about the same.  He cannot tell much of improvement with an addition of MS  Contin.  He continues to take Percocet 3-5 times a day for breakthrough pain.  He does find the Percocet is helpful in improving his symptoms.  We could try to increase MS Contin to every 8 hour dosing in an attempt to lessen his need for frequent as needed medications.  Patient denies other distressing symptoms.  Treatment is being held today due to hyperkalemia.  Patient has been referred to nephrology.  Appetite/weight is stable.  We will readdress ACP documents during future visit.  Patient previously stated that he thought he would not want aggressive measures at end-of-life.  PLAN: -Continue current scope of treatment -Continue MS Contin 15 mg every 12 hours.  Would consider increasing to every 8 hours if pain remains poorly controlled -Continue Percocet 5-367m every 8 hours as needed for breakthrough pain -Prophylactic bowel regimen -ACP/MOST form previously reviewed -Follow-up telephone visit in 3 to 4 weeks  Case and plan  discussed with Dr. Rogue Bussing   Patient expressed understanding and was in agreement with this plan. He also understands that He can call the clinic at any time with any questions, concerns, or complaints.     Time Total: 15 minutes  Visit consisted of counseling and education dealing with the complex and emotionally intense issues of symptom management and palliative care in the setting of serious and potentially life-threatening illness.Greater than 50%  of this time was spent counseling and coordinating care related to the above assessment and plan.  Signed by: Altha Harm, PhD, NP-C

## 2019-06-30 ENCOUNTER — Inpatient Hospital Stay: Payer: Medicare Other

## 2019-07-05 ENCOUNTER — Telehealth: Payer: Self-pay | Admitting: Pain Medicine

## 2019-07-05 NOTE — Telephone Encounter (Signed)
Pt called and stated that he would like to know the results of his x rays.

## 2019-07-06 ENCOUNTER — Ambulatory Visit: Payer: Medicare Other

## 2019-07-06 ENCOUNTER — Encounter (INDEPENDENT_AMBULATORY_CARE_PROVIDER_SITE_OTHER): Payer: Medicare Other

## 2019-07-06 ENCOUNTER — Other Ambulatory Visit: Payer: Medicare Other

## 2019-07-06 ENCOUNTER — Ambulatory Visit: Payer: Medicare Other | Admitting: Internal Medicine

## 2019-07-06 ENCOUNTER — Ambulatory Visit (INDEPENDENT_AMBULATORY_CARE_PROVIDER_SITE_OTHER): Payer: Medicare Other | Admitting: Vascular Surgery

## 2019-07-06 NOTE — Telephone Encounter (Signed)
Results read to patient. 

## 2019-07-07 ENCOUNTER — Encounter: Payer: Self-pay | Admitting: Pain Medicine

## 2019-07-07 ENCOUNTER — Other Ambulatory Visit: Payer: Self-pay

## 2019-07-07 ENCOUNTER — Ambulatory Visit: Payer: Medicare Other | Attending: Internal Medicine

## 2019-07-07 DIAGNOSIS — Z23 Encounter for immunization: Secondary | ICD-10-CM

## 2019-07-07 NOTE — Progress Notes (Signed)
   Covid-19 Vaccination Clinic  Name:  James [name]    MRN: [id number] DOB: 10-24-1950  07/07/2019  James [name] was observed post Covid-19 immunization for 15 minutes without incident. He was provided with Vaccine Information Sheet and instruction to access the V-Safe system.   James [name] was instructed to call 911 with any severe reactions post vaccine: Marland Kitchen Difficulty breathing  . Swelling of face and throat  . A fast heartbeat  . A bad rash all over body  . Dizziness and weakness   Immunizations Administered    Name Date Dose VIS Date Route   Moderna COVID-19 Vaccine 07/07/2019 11:22 AM 0.5 mL 03/30/2019 Intramuscular   Manufacturer: Moderna   Lot: [id number]   [place]: [id number]

## 2019-07-07 NOTE — Telephone Encounter (Signed)
LM for patient to call us back for pre virtual appointment questions.

## 2019-07-08 ENCOUNTER — Inpatient Hospital Stay: Payer: Medicare Other

## 2019-07-08 ENCOUNTER — Telehealth: Payer: Self-pay | Admitting: *Deleted

## 2019-07-08 ENCOUNTER — Other Ambulatory Visit: Payer: Self-pay

## 2019-07-08 ENCOUNTER — Inpatient Hospital Stay (HOSPITAL_BASED_OUTPATIENT_CLINIC_OR_DEPARTMENT_OTHER): Payer: Medicare Other | Admitting: Hospice and Palliative Medicine

## 2019-07-08 ENCOUNTER — Encounter: Payer: Self-pay | Admitting: Internal Medicine

## 2019-07-08 ENCOUNTER — Inpatient Hospital Stay (HOSPITAL_BASED_OUTPATIENT_CLINIC_OR_DEPARTMENT_OTHER): Payer: Medicare Other | Admitting: Internal Medicine

## 2019-07-08 VITALS — BP 116/72 | HR 83 | Resp 18

## 2019-07-08 DIAGNOSIS — C9110 Chronic lymphocytic leukemia of B-cell type not having achieved remission: Secondary | ICD-10-CM | POA: Diagnosis not present

## 2019-07-08 DIAGNOSIS — Z515 Encounter for palliative care: Secondary | ICD-10-CM | POA: Diagnosis not present

## 2019-07-08 DIAGNOSIS — Z5112 Encounter for antineoplastic immunotherapy: Secondary | ICD-10-CM | POA: Diagnosis not present

## 2019-07-08 DIAGNOSIS — Z7189 Other specified counseling: Secondary | ICD-10-CM | POA: Diagnosis not present

## 2019-07-08 DIAGNOSIS — K5900 Constipation, unspecified: Secondary | ICD-10-CM | POA: Diagnosis not present

## 2019-07-08 LAB — COMPREHENSIVE METABOLIC PANEL
ALT: 13 U/L (ref 0–44)
AST: 12 U/L — ABNORMAL LOW (ref 15–41)
Albumin: 4.6 g/dL (ref 3.5–5.0)
Alkaline Phosphatase: 40 U/L (ref 38–126)
Anion gap: 10 (ref 5–15)
BUN: 32 mg/dL — ABNORMAL HIGH (ref 8–23)
CO2: 24 mmol/L (ref 22–32)
Calcium: 9.4 mg/dL (ref 8.9–10.3)
Chloride: 103 mmol/L (ref 98–111)
Creatinine, Ser: 1.21 mg/dL (ref 0.61–1.24)
GFR calc Af Amer: >60 mL/min (ref >60)
GFR calc non Af Amer: >60 mL/min (ref >60)
Glucose, Bld: 159 mg/dL — ABNORMAL HIGH (ref 70–99)
Potassium: 4.9 mmol/L (ref 3.5–5.1)
Sodium: 137 mmol/L (ref 135–145)
Total Bilirubin: 0.5 mg/dL (ref 0.3–1.2)
Total Protein: 7.2 g/dL (ref 6.5–8.1)

## 2019-07-08 LAB — CBC WITH DIFFERENTIAL/PLATELET
Abs Immature Granulocytes: 0.11 10*3/uL — ABNORMAL HIGH (ref 0.00–0.07)
Basophils Absolute: 0.1 10*3/uL (ref 0.0–0.1)
Basophils Relative: 0 %
Eosinophils Absolute: 0 10*3/uL (ref 0.0–0.5)
Eosinophils Relative: 0 %
HCT: 27.7 % — ABNORMAL LOW (ref 39.0–52.0)
Hemoglobin: 8.4 g/dL — ABNORMAL LOW (ref 13.0–17.0)
Immature Granulocytes: 0 %
Lymphocytes Relative: 73 %
Lymphs Abs: 20.6 10*3/uL — ABNORMAL HIGH (ref 0.7–4.0)
MCH: 33.6 pg (ref 26.0–34.0)
MCHC: 30.3 g/dL (ref 30.0–36.0)
MCV: 110.8 fL — ABNORMAL HIGH (ref 80.0–100.0)
Monocytes Absolute: 1.9 10*3/uL — ABNORMAL HIGH (ref 0.1–1.0)
Monocytes Relative: 7 %
Neutro Abs: 5.6 10*3/uL (ref 1.7–7.7)
Neutrophils Relative %: 20 %
Platelets: 142 10*3/uL — ABNORMAL LOW (ref 150–400)
RBC: 2.5 MIL/uL — ABNORMAL LOW (ref 4.22–5.81)
RDW: 17.2 % — ABNORMAL HIGH (ref 11.5–15.5)
Smear Review: ADEQUATE
WBC Morphology: ABNORMAL
WBC: 28.2 10*3/uL — ABNORMAL HIGH (ref 4.0–10.5)
nRBC: 0 % (ref 0.0–0.2)

## 2019-07-08 LAB — LACTATE DEHYDROGENASE: LDH: 123 U/L (ref 98–192)

## 2019-07-08 MED ORDER — DIPHENHYDRAMINE HCL 50 MG/ML IJ SOLN
50.0000 mg | Freq: Once | INTRAMUSCULAR | Status: AC
  Administered 2019-07-08: 50 mg via INTRAVENOUS
  Filled 2019-07-08: qty 1

## 2019-07-08 MED ORDER — SODIUM CHLORIDE 0.9 % IV SOLN
Freq: Once | INTRAVENOUS | Status: AC
  Filled 2019-07-08: qty 250

## 2019-07-08 MED ORDER — SODIUM CHLORIDE 0.9 % IV SOLN
100.0000 mg | Freq: Once | INTRAVENOUS | Status: AC
  Administered 2019-07-08: 100 mg via INTRAVENOUS
  Filled 2019-07-08: qty 4

## 2019-07-08 MED ORDER — MONTELUKAST SODIUM 10 MG PO TABS
10.0000 mg | ORAL_TABLET | Freq: Once | ORAL | Status: AC
  Administered 2019-07-08: 10 mg via ORAL
  Filled 2019-07-08: qty 1

## 2019-07-08 MED ORDER — SODIUM CHLORIDE 0.9 % IV SOLN
20.0000 mg | Freq: Once | INTRAVENOUS | Status: AC
  Administered 2019-07-08: 20 mg via INTRAVENOUS
  Filled 2019-07-08: qty 20

## 2019-07-08 MED ORDER — ACETAMINOPHEN 325 MG PO TABS
650.0000 mg | ORAL_TABLET | Freq: Once | ORAL | Status: AC
  Administered 2019-07-08: 650 mg via ORAL
  Filled 2019-07-08: qty 2

## 2019-07-08 NOTE — Assessment & Plan Note (Addendum)
#   CLL/SLL- relapsed most recently on ibrutinib; FEB 2021- CT Ab/Pelvis-CT scan chest and pelvis shows significant progression of axillary/mediastinal/abdominal/pelvic-however the largest lesion approximately inch in size.  Rising white count-suggestive of progressive disease.  Plan Gazyva starting today.   # proceed with Gazyva cycle #1 today. Labs today reviewed;  acceptable for treatment today.   #Hyperkalemia/CKD-III-secondary renal insufficiency/creatinine 1.6- [baseline 1.4]. improved. On Loklema as per nephrology.   #Anemia-hemoglobin ~8-9 CKD versus progressive leukemia; STABLE.   Hold Aranesp for now  # Abdominal pain/dyspepsia/unclear etiology [no clear evidence of malignancy causing the pain]-continue narcotic pain medication.  Overall stable.  # Constipation- recommend miralxa/hydration.  # Palliative care evaluation:s/p evaluation with Josh.   # DISPOSITION: #   Proceed with Gazyva; d-2. - #  1 week- MD; labs- cbc/bmp;Gazyva- Dr.B

## 2019-07-08 NOTE — Progress Notes (Signed)
[facility] OFFICE PROGRESS NOTE  Patient Care Team: [name], MD as PCP - General (Internal Medicine)  Cancer Staging No matching staging information was found for the patient.   Oncology History Overview Note  # 2006- CLL STAGE IV; MAY 2011- WBC- 57K;Platelets-99;Hb-12/CT Bulky LN; BMBx- 80% Invol; del 11; START Benda-Ritux x4 [finished Sep 2011];   # July 2015-Progression; Sep 2015-START ibrutinib; CT scan DEC 2015- Improvement LN; Cont Ibrutinib 174m/d; NOV 2016 CT- 1-2CM LN [mild progression compared to Dec 2015];NOV 2016- FISH peripheral blood- NO MUTATIONS/CD-38 Positive; NOV 7th- CONT IBRUTINIB 2 pills/day; CT AUG 2017- STABLE;  DEC 6th PET- Mild RP LN/ Retrocrural LN  # OFF ibrutinib [? intol]- sep 2019- Jan 16th 2020; Re-start Ibrutinib; September 2020-stop ibrutinib [poor tolerance/worsening anemia]  # Jan 16th 2020- start aranesp; HOLD [place] on [place]   # MARCH 11th 2021-[name]  # DEC 2019- PAIN CONTRACT  # PALLIATIVE CARE- 06/21/2019-  # October 2019-bone marrow biopsy [worsening anemia]-question dyserythropoietic changes/small clone of CLL;  # [facility] HEM- NEG.  SA skin infection [Oct 2016] s/p clinda -------------------------------------------------------    DIAGNOSIS: CLL  STAGE:   IV      ;GOALS: palliative  CURRENT/MOST RECENT THERAPY : Gazyva [C]   CLL (chronic lymphocytic leukemia) ([place]  07/08/2019 -  Chemotherapy   The patient had obinutuzumab (GAZYVA) 100 mg in sodium chloride 0.9 % 100 mL (0.9615 mg/mL) chemo infusion, 100 mg, Intravenous, Once, 1 of 6 cycles Administration: 100 mg (07/08/2019), 900 mg (07/09/2019)  for chemotherapy treatment.      INTERVAL HISTORY:  FGeorgette Dover89y.o.  male pleasant patient above progressive CLL [currently off ibrutinib] and chronic abdominal pain of unclear etiology; worsening anemia of unclear etiology [? CKD/hyperkalemia] is here for follow-up/proceed with  chemotherapy.  The interim patient was evaluated by nephrology; started on potassium depleting agent for his hyperkalemia.  Patient overall feels poorly.  Poor appetite.  Continues to chronic epigastric/abdominal discomfort.  Positive for nausea no vomiting.   Review of Systems  Constitutional: Positive for malaise/fatigue and weight loss. Negative for chills, diaphoresis and fever.  HENT: Negative for nosebleeds and sore throat.   Eyes: Negative for double vision.  Respiratory: Negative for cough, hemoptysis, sputum production, shortness of breath and wheezing.   Cardiovascular: Negative for chest pain, palpitations, orthopnea and leg swelling.  Gastrointestinal: Positive for abdominal pain, constipation and nausea. Negative for blood in stool, diarrhea, heartburn, melena and vomiting.  Genitourinary: Negative for dysuria, frequency and urgency.  Skin: Negative.  Negative for itching and rash.  Neurological: Negative for dizziness, tingling, focal weakness, weakness and headaches.  Endo/Heme/Allergies: Does not bruise/bleed easily.  Psychiatric/Behavioral: Negative for depression. The patient is not nervous/anxious and does not have insomnia.       PAST MEDICAL HISTORY :  Past Medical History:  Diagnosis Date  . C[place] ([place]ocytic [place]) ([place]   . Depression   . Diabetes mellitus without complication ([place]   . Hematuria   . Hyperlipidemia   . Hypertension     PAST SURGICAL HISTORY :   Past Surgical History:  Procedure Laterality Date  . CATARACT EXTRACTION W/ INTRAOCULAR LENS IMPLANT Bilateral   . CHOLECYSTECTOMY  1983  . COLONOSCOPY WITH PROPOFOL N/A 10/27/2017   Procedure: COLONOSCOPY WITH PROPOFOL;  Surgeon: EManya Silvas MD;  [facility]  Service: Endoscopy;  Laterality: N/A;  . ESOPHAGOGASTRODUODENOSCOPY (EGD) WITH PROPOFOL N/A 11/20/2016   Procedure: ESOPHAGOGASTRODUODENOSCOPY (EGD) WITH PROPOFOL;  Surgeon: Manya Silvas, MD;  Location[facility]  [facility] END[facility]COPY;  Service: Endoscopy;  Laterality: N/A;  . ESOPHAGOGASTRODUODENOSCOPY (EGD) WITH PROPOFOL N/A 10/27/2017   Procedure: ESOPHAGOGASTRODUODENOSCOPY (EGD) WITH PROPOFOL;  Surgeon: Manya Silvas, MD;  Location[facility] END[facility]COPY;  Service: Endoscopy;  Laterality: N/A;  . LOWER EXTREMITY ANGIOGRAPHY Right 05/19/2017   Procedure: LOWER EXTREMITY ANGIOGRAPHY;  Surgeon: Algernon Huxley, MD;  Location: [place] CV LAB;  Service: Cardiovascular;  Laterality: Right;  . LOWER EXTREMITY ANGIOGRAPHY Left 08/10/2018   Procedure: LOWER EXTREMITY ANGIOGRAPHY;  Surgeon: Algernon Huxley, MD;  Location: [place] CV LAB;  Service: Cardiovascular;  Laterality: Left;    FAMILY HISTORY :   Family History  Problem Relation Age of Onset  . Hypertension Sister     SOCIAL HISTORY:   Social History   Tobacco Use  . Smoking status: Current Every Day Smoker    Packs/day: 0.50    Years: 47.00    Pack years: 23.50    Types: Cigarettes  . Smokeless tobacco: Never Used  Substance Use Topics  . Alcohol use: Yes    Alcohol/week: 1.0 standard drinks    Types: 1 Glasses of wine per week    Comment:  almost none in last 6 months  . Drug use: No    ALLERGIES:  has No Known Allergies.  MEDICATIONS:  Current Outpatient Medications  Medication Sig Dispense Refill  . acyclovir (ZOVIRAX) 400 MG tablet One pill a day [to prevent shingles] 30 tablet 3  . aspirin EC 81 MG tablet Take 81 mg by mouth daily.    Marland Kitchen atorvastatin (LIPITOR) 10 MG tablet Take 1 tablet (10 mg total) by mouth daily. 30 tablet 11  . Calcium Carb-Cholecalciferol (CALCIUM PLUS D3 ABSORBABLE) [id number] MG-UNIT CAPS Take 1 capsule by mouth 2 (two) times daily with a meal. 60 capsule 5  . Cholecalciferol (VITAMIN D3) 125 MCG (5000 UT) CAPS Take 1 capsule (5,000 Units total) by mouth daily with breakfast. Take along with calcium and magnesium. 30 capsule 5  . clopidogrel (PLAVIX) 75 MG tablet Take 1 tablet by mouth once daily 90 tablet 0  .  dexamethasone (DECADRON) 4 MG tablet Start 2 days prior to infusion; Take for 2 days. Do not take on the day of infusion. 60 tablet 3  . Ensure (ENSURE) Take 237 mLs by mouth.    . folic acid (FOLVITE) 1 MG tablet Take 1 tablet (1 mg total) by mouth daily. 30 tablet 1  . iron polysaccharides (NU-IRON) 150 MG capsule Take 1 capsule (150 mg total) by mouth daily. 30 capsule 1  . lisinopril (ZESTRIL) 10 MG tablet Take 1 tablet (10 mg total) by mouth daily. 30 tablet 0  . metFORMIN (GLUCOPHAGE) 1000 MG tablet Take 1,000 mg by mouth daily with breakfast.     . montelukast (SINGULAIR) 10 MG tablet Take 1 tablet (10 mg total) by mouth at bedtime. Start 2 days prior to infusion. Take it for 4 days. 60 tablet 0  . morphine (MS CONTIN) 15 MG 12 hr tablet Take 1 tablet (15 mg total) by mouth every 12 (twelve) hours. 30 tablet 0  . oxyCODONE-acetaminophen (PERCOCET/ROXICET) 5-325 MG tablet 1 pill every 8-12 hours 90 tablet 0  . sodium polystyrene (KAYEXALATE) 15 GM/60ML suspension Take 60 mLs (15 g total) by mouth every 6 (six) hours. Until 2 loose stools 240 mL 0   No current facility-administered medications for this visit.    PHYSICAL EXAMINATION: ECOG PERFORMANCE STATUS: 1 - Symptomatic but  completely ambulatory  BP 134/71 (BP Location: Left Arm, Patient Position: Sitting, Cuff Size: Normal)   Pulse 92   Temp (!) 96 F (35.6 C) (Tympanic)   Resp 20   Wt 145 lb 6 oz (65.9 kg)   BMI 22.10 kg/m   Filed Weights   07/08/19 0852  Weight: 145 lb 6 oz (65.9 kg)    Physical Exam  Constitutional: He is oriented to person, place, and time and well-developed, well-nourished, and in no distress.  He is alone.  Appears pale.  HENT:  Head: Normocephalic and atraumatic.  Mouth/Throat: Oropharynx is clear and moist. No oropharyngeal exudate.  Eyes: Pupils are equal, round, and reactive to light.  Cardiovascular: Normal rate and regular rhythm.  Pulmonary/Chest: No respiratory distress. He has no  wheezes.  Decreased air entry bil.   Abdominal: Soft. Bowel sounds are normal. He exhibits no distension and no mass. There is no abdominal tenderness. There is no rebound and no guarding.  Musculoskeletal:        General: No tenderness or edema. Normal range of motion.     Cervical back: Normal range of motion and neck supple.  Neurological: He is alert and oriented to person, place, and time.  Skin: Skin is warm. There is pallor.  Multiple bruises noted in bilateral upper extremity.  Psychiatric: Affect normal.      LABORATORY DATA:  I have reviewed the data as listed    Component Value Date/Time   NA 137 07/08/2019 0824   NA 142 05/03/2014 1139   K 4.9 07/08/2019 0824   K 4.7 05/03/2014 1139   CL 103 07/08/2019 0824   CL 110 (H) 05/03/2014 1139   CO2 24 07/08/2019 0824   CO2 24 05/03/2014 1139   GLUCOSE 159 (H) 07/08/2019 0824   GLUCOSE 98 05/03/2014 1139   BUN 32 (H) 07/08/2019 0824   BUN 20 (H) 05/03/2014 1139   CREATININE 1.21 07/08/2019 0824   CREATININE 1.22 08/09/2014 1122   CALCIUM 9.4 07/08/2019 0824   CALCIUM 8.6 05/03/2014 1139   PROT 7.2 07/08/2019 0824   PROT 7.5 05/03/2014 1139   ALBUMIN 4.6 07/08/2019 0824   ALBUMIN 4.1 05/03/2014 1139   AST 12 (L) 07/08/2019 0824   AST 15 05/03/2014 1139   ALT 13 07/08/2019 0824   ALT 26 05/03/2014 1139   ALKPHOS 40 07/08/2019 0824   ALKPHOS 94 05/03/2014 1139   BILITOT 0.5 07/08/2019 0824   BILITOT 0.5 05/03/2014 1139   GFRNONAA >60 07/08/2019 0824   GFRNONAA >60 08/09/2014 1122   GFRAA >60 07/08/2019 0824   GFRAA >60 08/09/2014 1122    No results found for: SPEP, UPEP  Lab Results  Component Value Date   WBC 28.2 (H) 07/08/2019   NEUTROABS 5.6 07/08/2019   HGB 8.4 (L) 07/08/2019   HCT 27.7 (L) 07/08/2019   MCV 110.8 (H) 07/08/2019   PLT 142 (L) 07/08/2019      Chemistry      Component Value Date/Time   NA 137 07/08/2019 0824   NA 142 05/03/2014 1139   K 4.9 07/08/2019 0824   K 4.7 05/03/2014  1139   CL 103 07/08/2019 0824   CL 110 (H) 05/03/2014 1139   CO2 24 07/08/2019 0824   CO2 24 05/03/2014 1139   BUN 32 (H) 07/08/2019 0824   BUN 20 (H) 05/03/2014 1139   CREATININE 1.21 07/08/2019 0824   CREATININE 1.22 08/09/2014 1122      Component Value Date/Time   CALCIUM  9.4 07/08/2019 0824   CALCIUM 8.6 05/03/2014 1139   ALKPHOS 40 07/08/2019 0824   ALKPHOS 94 05/03/2014 1139   AST 12 (L) 07/08/2019 0824   AST 15 05/03/2014 1139   ALT 13 07/08/2019 0824   ALT 26 05/03/2014 1139   BILITOT 0.5 07/08/2019 0824   BILITOT 0.5 05/03/2014 1139       RADIOGRAPHIC STUDIES: I have personally reviewed the radiological images as listed and agreed with the findings in the report. No results found.   ASSESSMENT & PLAN:  CLL (chronic lymphocytic leukemia) ([facility]) # CLL/SLL- relapsed most recently on ibrutinib; FEB 2021- CT Ab/Pelvis-CT scan chest and pelvis shows significant progression of axillary/mediastinal/abdominal/pelvic-however the largest lesion approximately inch in size.  Rising white count-suggestive of progressive disease.  Plan Gazyva starting today.   # proceed with Gazyva cycle #1 today. Labs today reviewed;  acceptable for treatment today.   #Hyperkalemia/CKD-III-secondary renal insufficiency/creatinine 1.6- [baseline 1.4]. improved. On Loklema as per nephrology.   #Anemia-hemoglobin ~8-9 CKD versus progressive leukemia; STABLE.   Hold Aranesp for now  # Abdominal pain/dyspepsia/unclear etiology [no clear evidence of malignancy causing the pain]-continue narcotic pain medication.  Overall stable.  # Constipation- recommend miralxa/hydration.  # Palliative care evaluation:s/p evaluation with Josh.   # DISPOSITION: #   Proceed with Gazyva; d-2. - #  1 week- MD; labs- cbc/bmp;Gazyva- Dr.B       No orders of the defined types were placed in this encounter.  All questions were answered. The patient knows to call the clinic with any problems, questions or  concerns.      Cammie Sickle, MD 07/12/2019 7:26 AM

## 2019-07-08 NOTE — Telephone Encounter (Signed)
Attempted to call for pre appointment review of allergies/meds. Spoke with wife, patient at the hospital "getting a treatment"

## 2019-07-08 NOTE — Progress Notes (Signed)
Nutrition Assessment   Reason for Assessment:   Referral from [place], NP regarding weight loss, poor appetite   ASSESSMENT: 69 year old male with stage IV CLL.  Past medical history of depression, DM, HLD, HTN, CKD.  Patient receiving gazyva.    Met with patient during infusion.  Patient reports poor appetite for the past 2 months.  Noted work-up at [facility] for abdominal pain.  Patient reports no bloating, no gas, food does not make pain worse.  Reports typically only eats 1 meal per day around 6-7pm at night.  Last night ate 2 hot dogs without the bun and few fries.  Reports that he drinks a little bit of chocolate milk and water.  Has drank ensure (banana) in the past but is currently out of it.   Reports no bowel movement in the last 7 days. Reports for the past 3 days, for 2 of those days he has taken a suppository and drank prune juice without relief.      Medications: decadron, metformin, pain medications   Labs: K 4.9, glucose 159, BUN 32, creatinine 1.21   Anthropometrics:   Height: 68 inches Weight: 145 lb today 2/8 157 lb 1/6 163 lb BMI: 22  11% weight loss in the last 2 months, significant   Estimated Energy Needs  Kcals: 2000-2300 Protein: 100-115 g Fluid: > 2 L   NUTRITION DIAGNOSIS: Inadequate oral intake related to altered GI function (pain, constipation) as evidenced by 11% weight loss in the last 2 months and poor appetite (eating 1 meal per day)   INTERVENTION:  Reviewed with NP patient lack of bowel movement and needing bowel regimen.   Discussed with patient importance of "nibbling" q 2 hours.  Set a schedule to eat/drink shake.   Samples of different oral nutrition supplements given to patient today.  Current intake does not include high potassium foods.  Will monitor K level and intake to determine if switching oral nutrition shakes to nepro would be appropriate.   Trial of appetite stimulant maybe helpful as well if intake does not improve with bowel  regimen and abdominal pain.  Patient given contact information   MONITORING, EVALUATION, GOAL: Patient will consume adequate calories and protein to prevent weight loss   Next Visit: phone f/u April 1  Amariah Kierstead B. Zenia Resides, [place], [place] Registered Dietitian [phone] (pager)

## 2019-07-08 NOTE — Progress Notes (Signed)
[place]  Telephone:([phone] [phone]   Name: James [name] Date: 07/08/2019 MRN: [id number]  DOB: [birth year]  Patient Care Team: [name], MD as PCP - General (Internal Medicine)    REASON FOR CONSULTATION: James [name] is a 69 y.o. male with multiple medical problems including stage IV CLL (initially diagnosed in 2006) most recently treated with ibrutinib but was discontinued due to poor tolerance. He is now on treatment with Gazyva.  Patient has had severe and chronic abdominal pain of unclear etiology.  He has had extensive work-up including referral to [facility].  He has been followed by interventional management in the status post celiac plexus blocks.  Patient has continued to lose weight.  He was referred to palliative care to help address goals and manage ongoing symptoms.  SOCIAL HISTORY:     reports that he has been smoking cigarettes. He has a 23.50 pack-year smoking history. He has never used smokeless tobacco. He reports current alcohol use of about 1.0 standard drinks of alcohol per week. He reports that he does not use drugs.  Patient is married and lives at home with his wife.  He has a son in [place].  Patient formally worked as a Administrator.  ADVANCE DIRECTIVES:  Does not have  CODE STATUS: DNR/DNI (MOST form completed on 07/08/19)  PAST MEDICAL HISTORY: Past Medical History:  Diagnosis Date  . CLL (chronic lymphocytic leukemia) ([place])   . Depression   . Diabetes mellitus without complication ([place])   . Hematuria   . Hyperlipidemia   . Hypertension     PAST SURGICAL HISTORY:  Past Surgical History:  Procedure Laterality Date  . CATARACT EXTRACTION W/ INTRAOCULAR LENS IMPLANT Bilateral   . CHOLECYSTECTOMY  1983  . COLONOSCOPY WITH PROPOFOL N/A 10/27/2017   Procedure: COLONOSCOPY WITH PROPOFOL;  Surgeon: Manya Silvas, MD;  Location: [facility] ENDOSCOPY;  Service: Endoscopy;   Laterality: N/A;  . ESOPHAGOGASTRODUODENOSCOPY (EGD) WITH PROPOFOL N/A 11/20/2016   Procedure: ESOPHAGOGASTRODUODENOSCOPY (EGD) WITH PROPOFOL;  Surgeon: Manya Silvas, MD;  Location: [facility] ENDOSCOPY;  Service: Endoscopy;  Laterality: N/A;  . ESOPHAGOGASTRODUODENOSCOPY (EGD) WITH PROPOFOL N/A 10/27/2017   Procedure: ESOPHAGOGASTRODUODENOSCOPY (EGD) WITH PROPOFOL;  Surgeon: Manya Silvas, MD;  Location[facility] ENDOSCOPY;  Service: Endoscopy;  Laterality: N/A;  . LOWER EXTREMITY ANGIOGRAPHY Right 05/19/2017   Procedure: LOWER EXTREMITY ANGIOGRAPHY;  Surgeon: Algernon Huxley, MD;  Location: [place] CV LAB;  Service: Cardiovascular;  Laterality: Right;  . LOWER EXTREMITY ANGIOGRAPHY Left 08/10/2018   Procedure: LOWER EXTREMITY ANGIOGRAPHY;  Surgeon: Algernon Huxley, MD;  Location: [place] CV LAB;  Service: Cardiovascular;  Laterality: Left;    HEMATOLOGY/ONCOLOGY HISTORY:  Oncology History Overview Note  # 2006- CLL STAGE IV; MAY 2011- WBC- 57K;Platelets-99;Hb-12/CT Bulky LN; BMBx- 80% Invol; del 11; START Benda-Ritux x4 [finished Sep 2011];   # July 2015-Progression; Sep 2015-START ibrutinib; CT scan DEC 2015- Improvement LN; Cont Ibrutinib 145m/d; NOV 2016 CT- 1-2CM LN [mild progression compared to Dec 2015];NOV 2016- FISH peripheral blood- NO MUTATIONS/CD-38 Positive; NOV 7th- CONT IBRUTINIB 2 pills/day; CT AUG 2017- STABLE;  DEC 6th PET- Mild RP LN/ Retrocrural LN  # OFF ibrutinib [? intol]- sep 2019- Jan 16th 2020; Re-start Ibrutinib; September 2020-stop ibrutinib [poor tolerance/worsening anemia]  # Jan 16th 2020- start aranesp; HOLD while on [place]   # MARCH 11th 2021-[name]  # DEC 2019- PAIN CONTRACT  # PALLIATIVE CARE- 06/21/2019-  # October  2019-bone marrow biopsy [worsening anemia]-question dyserythropoietic changes/small clone of CLL;  # [facility] HEM- NEG.  SA skin infection [Oct 2016] s/p clinda -------------------------------------------------------     DIAGNOSIS: CLL  STAGE:   IV      ;GOALS: palliative  CURRENT/MOST RECENT THERAPY : Gazyva [C]   CLL (chronic lymphocytic leukemia) ([place])  07/08/2019 -  Chemotherapy   The patient had obinutuzumab (GAZYVA) 100 mg in sodium chloride 0.9 % 100 mL (0.9615 mg/mL) chemo infusion, 100 mg, Intravenous, Once, 1 of 6 cycles  for chemotherapy treatment.      ALLERGIES:  has No Known Allergies.  MEDICATIONS:  Current Outpatient Medications  Medication Sig Dispense Refill  . acyclovir (ZOVIRAX) 400 MG tablet One pill a day [to prevent shingles] 30 tablet 3  . aspirin EC 81 MG tablet Take 81 mg by mouth daily.    Marland Kitchen atorvastatin (LIPITOR) 10 MG tablet Take 1 tablet (10 mg total) by mouth daily. 30 tablet 11  . Calcium Carb-Cholecalciferol (CALCIUM PLUS D3 ABSORBABLE) [phone] MG-UNIT CAPS Take 1 capsule by mouth 2 (two) times daily with a meal. 60 capsule 5  . Cholecalciferol (VITAMIN D3) 125 MCG (5000 UT) CAPS Take 1 capsule (5,000 Units total) by mouth daily with breakfast. Take along with calcium and magnesium. 30 capsule 5  . clopidogrel (PLAVIX) 75 MG tablet Take 1 tablet by mouth once daily 90 tablet 0  . dexamethasone (DECADRON) 4 MG tablet Start 2 days prior to infusion; Take for 2 days. Do not take on the day of infusion. 60 tablet 3  . Ensure (ENSURE) Take 237 mLs by mouth.    . folic acid (FOLVITE) 1 MG tablet Take 1 tablet (1 mg total) by mouth daily. 30 tablet 1  . iron polysaccharides (NU-IRON) 150 MG capsule Take 1 capsule (150 mg total) by mouth daily. 30 capsule 1  . lisinopril (ZESTRIL) 10 MG tablet Take 1 tablet (10 mg total) by mouth daily. 30 tablet 0  . metFORMIN (GLUCOPHAGE) 1000 MG tablet Take 1,000 mg by mouth daily with breakfast.     . montelukast (SINGULAIR) 10 MG tablet Take 1 tablet (10 mg total) by mouth at bedtime. Start 2 days prior to infusion. Take it for 4 days. 60 tablet 0  . morphine (MS CONTIN) 15 MG 12 hr tablet Take 1 tablet (15 mg total) by mouth every 12  (twelve) hours. 30 tablet 0  . oxyCODONE-acetaminophen (PERCOCET/ROXICET) 5-325 MG tablet 1 pill every 8-12 hours 90 tablet 0  . sodium polystyrene (KAYEXALATE) 15 GM/60ML suspension Take 60 mLs (15 g total) by mouth every 6 (six) hours. Until 2 loose stools 240 mL 0   No current facility-administered medications for this visit.   Facility-Administered Medications Ordered in Other Visits  Medication Dose Route Frequency Provider Last Rate Last Admin  . obinutuzumab (GAZYVA) 100 mg in sodium chloride 0.9 % 100 mL (0.9615 mg/mL) chemo infusion  100 mg Intravenous Once Cammie Sickle, MD 26 mL/hr at 07/08/19 1228 100 mg at 07/08/19 1228    VITAL SIGNS: There were no vitals taken for this visit. There were no vitals filed for this visit.  Estimated body mass index is 22.1 kg/m as calculated from the following:   Height as of 06/07/19: 5' 8"  (1.727 m).   Weight as of an earlier encounter on 07/08/19: 145 lb 6 oz (65.9 kg).  LABS: CBC:    Component Value Date/Time   WBC 28.2 (H) 07/08/2019 0824   HGB 8.4 (L) 07/08/2019  0824   HGB 11.4 (L) 08/09/2014 1122   HCT 27.7 (L) 07/08/2019 0824   HCT 35.6 (L) 08/09/2014 1122   PLT 142 (L) 07/08/2019 0824   PLT 95 (L) 08/09/2014 1122   MCV 110.8 (H) 07/08/2019 0824   MCV 100 08/09/2014 1122   NEUTROABS 5.6 07/08/2019 0824   NEUTROABS 4.3 08/09/2014 1122   LYMPHSABS 20.6 (H) 07/08/2019 0824   LYMPHSABS 6.5 (H) 08/09/2014 1122   MONOABS 1.9 (H) 07/08/2019 0824   MONOABS 1.1 (H) 08/09/2014 1122   EOSABS 0.0 07/08/2019 0824   EOSABS 0.1 08/09/2014 1122   BASOSABS 0.1 07/08/2019 0824   BASOSABS 0.1 08/09/2014 1122   Comprehensive Metabolic Panel:    Component Value Date/Time   NA 137 07/08/2019 0824   NA 142 05/03/2014 1139   K 4.9 07/08/2019 0824   K 4.7 05/03/2014 1139   CL 103 07/08/2019 0824   CL 110 (H) 05/03/2014 1139   CO2 24 07/08/2019 0824   CO2 24 05/03/2014 1139   BUN 32 (H) 07/08/2019 0824   BUN 20 (H) 05/03/2014 1139    CREATININE 1.21 07/08/2019 0824   CREATININE 1.22 08/09/2014 1122   GLUCOSE 159 (H) 07/08/2019 0824   GLUCOSE 98 05/03/2014 1139   CALCIUM 9.4 07/08/2019 0824   CALCIUM 8.6 05/03/2014 1139   AST 12 (L) 07/08/2019 0824   AST 15 05/03/2014 1139   ALT 13 07/08/2019 0824   ALT 26 05/03/2014 1139   ALKPHOS 40 07/08/2019 0824   ALKPHOS 94 05/03/2014 1139   BILITOT 0.5 07/08/2019 0824   BILITOT 0.5 05/03/2014 1139   PROT 7.2 07/08/2019 0824   PROT 7.5 05/03/2014 1139   ALBUMIN 4.6 07/08/2019 0824   ALBUMIN 4.1 05/03/2014 1139    RADIOGRAPHIC STUDIES: CT Abdomen Pelvis Wo Contrast  Result Date: 06/18/2019 CLINICAL DATA:  Restaging CLL. Initial diagnosis 2011. Decreased appetite and weight loss over the past 3-4 months. EXAM: CT CHEST, ABDOMEN AND PELVIS WITHOUT CONTRAST TECHNIQUE: Multidetector CT imaging of the chest, abdomen and pelvis was performed following the standard protocol without IV contrast. COMPARISON:  CT scan 11/17/2018 FINDINGS: CT CHEST FINDINGS Cardiovascular: The heart is normal in size. No pericardial effusion. There is tortuosity and moderate atherosclerotic calcification involving the thoracic aorta. No focal aneurysm. Branch vessel calcifications including three-vessel coronary artery calcifications are again noted. Mediastinum/Nodes: Lower neck, supraclavicular, supraclavicular fossa, subpectoral and bilateral axillary adenopathy. Left supraclavicular lymph node on image 1/2 measures 11 mm. I do not see this on the prior study. Right subpectoral lymph node on image 11/2 measures 13 mm. This measures 6 mm on the prior study. Right axillary lymph node on image 14/2 measures 18.5 mm and previously measured 9 mm. 17 mm left axillary lymph node on image 15/2 previously measured 10 mm. 10.5 mm precarinal lymph node on image 27/2 previously measured 6 mm. AP window node on image 24/2 measures 9.5 mm and previously measured 5.5 mm. Lungs/Pleura: Stable mild emphysematous changes. No  acute pulmonary findings. No worrisome pulmonary lesions. No enlarged pleural nodes. No pleural effusion. Musculoskeletal: No chest wall mass. The thyroid gland appears normal. The bony structures are unremarkable. Mild osteoporosis. CT ABDOMEN PELVIS FINDINGS Hepatobiliary: No focal hepatic lesions are identified without contrast. Gallbladder is surgically absent. No common bile duct dilatation. Pancreas: No mass, inflammation or ductal dilatation. Spleen: Mild splenomegaly. The spleen measures 12.0 x 9.5 x 8.0 cm. No splenic lesions. Adrenals/Urinary Tract: The adrenal glands and kidneys are unremarkable. There are small bilateral renal calculi  and bilateral renal cysts. No worrisome renal lesions. The bladder is unremarkable. Stomach/Bowel: The stomach, duodenum, small bowel and colon are unremarkable. No acute inflammatory changes, mass lesions or obstructive findings. The terminal ileum and appendix are normal. Vascular/Lymphatic: Advanced atherosclerotic calcifications involving the aorta and branch vessels. Bilateral iliac artery stents are noted. Enlarging mesenteric and retroperitoneal lymph nodes. 10.5 mm mesenteric node on image 73/2 is new. Left-sided retroperitoneal nodal mass measures 3.4 x 2.3 cm on image 79/2. This previously measured less than 1 cm. Right-sided retroperitoneal adenopathy is also noted. Small bilateral pelvic sidewall lymph nodes are noted. 11 mm obturator lymph node on image 117/2 is new. No inguinal adenopathy. Reproductive: The prostate gland is enlarged. There is median lobe hypertrophy impressing on the base the bladder. The seminal vesicles appear normal. Other: No free pelvic fluid collections. No inguinal hernia. Musculoskeletal: No significant bony findings. IMPRESSION: 1. Significant progression of lymphadenopathy in the lower neck, chest, abdomen and pelvis as detailed above. 2. Mild splenomegaly. 3. Advanced atherosclerotic calcifications involving the thoracic and  abdominal aorta and branch vessels including three-vessel coronary artery calcifications. 4. Bilateral renal calculi and renal cysts. 5. Enlarged prostate gland with median lobe hypertrophy impressing on the base the bladder. Aortic Atherosclerosis (ICD10-I70.0). Electronically Signed   By: Marijo Sanes M.D.   On: 06/18/2019 14:45   DG Thoracic Spine 2 View  Result Date: 06/16/2019 CLINICAL DATA:  Upper back pain for several months, no known injury, initial encounter EXAM: THORACIC SPINE 2 VIEWS COMPARISON:  None. FINDINGS: Vertebral body height is well maintained. Mild osteophytes are noted. Pedicles are within normal limits and no paraspinal mass lesion is seen. No soft tissue abnormality or rib abnormality is seen. IMPRESSION: Degenerative change without acute abnormality. Electronically Signed   By: Inez Catalina M.D.   On: 06/16/2019 16:15   DG Lumbar Spine Complete W/Bend  Result Date: 06/16/2019 CLINICAL DATA:  Low back pain for several months EXAM: LUMBAR SPINE - COMPLETE WITH BENDING VIEWS COMPARISON:  11/17/2018 FINDINGS: Five lumbar type vertebral bodies are well visualized. Vertebral body height is well maintained. No pars defects are seen. Facet hypertrophic changes are noted. No anterolisthesis is seen. Flexion and extension views show no significant instability. Bilateral iliac stenting is noted. Aortic calcifications are seen without aneurysmal dilatation. IMPRESSION: Degenerative change without instability. Electronically Signed   By: Inez Catalina M.D.   On: 06/16/2019 16:14   CT CHEST WO CONTRAST  Result Date: 06/18/2019 CLINICAL DATA:  Restaging CLL. Initial diagnosis 2011. Decreased appetite and weight loss over the past 3-4 months. EXAM: CT CHEST, ABDOMEN AND PELVIS WITHOUT CONTRAST TECHNIQUE: Multidetector CT imaging of the chest, abdomen and pelvis was performed following the standard protocol without IV contrast. COMPARISON:  CT scan 11/17/2018 FINDINGS: CT CHEST FINDINGS  Cardiovascular: The heart is normal in size. No pericardial effusion. There is tortuosity and moderate atherosclerotic calcification involving the thoracic aorta. No focal aneurysm. Branch vessel calcifications including three-vessel coronary artery calcifications are again noted. Mediastinum/Nodes: Lower neck, supraclavicular, supraclavicular fossa, subpectoral and bilateral axillary adenopathy. Left supraclavicular lymph node on image 1/2 measures 11 mm. I do not see this on the prior study. Right subpectoral lymph node on image 11/2 measures 13 mm. This measures 6 mm on the prior study. Right axillary lymph node on image 14/2 measures 18.5 mm and previously measured 9 mm. 17 mm left axillary lymph node on image 15/2 previously measured 10 mm. 10.5 mm precarinal lymph node on image 27/2 previously measured 6 mm.  AP window node on image 24/2 measures 9.5 mm and previously measured 5.5 mm. Lungs/Pleura: Stable mild emphysematous changes. No acute pulmonary findings. No worrisome pulmonary lesions. No enlarged pleural nodes. No pleural effusion. Musculoskeletal: No chest wall mass. The thyroid gland appears normal. The bony structures are unremarkable. Mild osteoporosis. CT ABDOMEN PELVIS FINDINGS Hepatobiliary: No focal hepatic lesions are identified without contrast. Gallbladder is surgically absent. No common bile duct dilatation. Pancreas: No mass, inflammation or ductal dilatation. Spleen: Mild splenomegaly. The spleen measures 12.0 x 9.5 x 8.0 cm. No splenic lesions. Adrenals/Urinary Tract: The adrenal glands and kidneys are unremarkable. There are small bilateral renal calculi and bilateral renal cysts. No worrisome renal lesions. The bladder is unremarkable. Stomach/Bowel: The stomach, duodenum, small bowel and colon are unremarkable. No acute inflammatory changes, mass lesions or obstructive findings. The terminal ileum and appendix are normal. Vascular/Lymphatic: Advanced atherosclerotic calcifications  involving the aorta and branch vessels. Bilateral iliac artery stents are noted. Enlarging mesenteric and retroperitoneal lymph nodes. 10.5 mm mesenteric node on image 73/2 is new. Left-sided retroperitoneal nodal mass measures 3.4 x 2.3 cm on image 79/2. This previously measured less than 1 cm. Right-sided retroperitoneal adenopathy is also noted. Small bilateral pelvic sidewall lymph nodes are noted. 11 mm obturator lymph node on image 117/2 is new. No inguinal adenopathy. Reproductive: The prostate gland is enlarged. There is median lobe hypertrophy impressing on the base the bladder. The seminal vesicles appear normal. Other: No free pelvic fluid collections. No inguinal hernia. Musculoskeletal: No significant bony findings. IMPRESSION: 1. Significant progression of lymphadenopathy in the lower neck, chest, abdomen and pelvis as detailed above. 2. Mild splenomegaly. 3. Advanced atherosclerotic calcifications involving the thoracic and abdominal aorta and branch vessels including three-vessel coronary artery calcifications. 4. Bilateral renal calculi and renal cysts. 5. Enlarged prostate gland with median lobe hypertrophy impressing on the base the bladder. Aortic Atherosclerosis (ICD10-I70.0). Electronically Signed   By: Marijo Sanes M.D.   On: 06/18/2019 14:45    PERFORMANCE STATUS (ECOG) : 1 - Symptomatic but completely ambulatory  Review of Systems Unless otherwise noted, a complete review of systems is negative.  Physical Exam General: NAD, frail appearing, thin Pulmonary: Unlabored Extremities: no edema, no joint deformities Skin: no rashes Neurological: Weakness but otherwise nonfocal  IMPRESSION: Routine follow-up visit today.  Patient was seen in the infusion area.  Patient reports the pain is stable.  He does now have constipation, which likely could be secondary to pain medications.  Last bowel movement was about a week ago.  He has tried intermittent senna and Dulcolax  suppositories.  We discussed increasing his bowel regimen to include once or twice daily MiraLAX, increased senna, and possible trial of an enema.  We also discussed options of Colace and mag citrate.  Could trial a peripheral opioid receptor antagonist but patient has not yet maximized OTC meds.  Patient says that he is decided that he would not want to be resuscitated or have his life prolonged artificially machines.  He would be okay with short-term hospitalization if needed.   I completed a MOST form today. The patient and family outlined their wishes for the following treatment decisions:  Cardiopulmonary Resuscitation: Do Not Attempt Resuscitation (DNR/No CPR)  Medical Interventions: Limited Additional Interventions: Use medical treatment, IV fluids and cardiac monitoring as indicated, DO NOT USE intubation or mechanical ventilation. May consider use of less invasive airway support such as BiPAP or CPAP. Also provide comfort measures. Transfer to the hospital if indicated. Avoid intensive  care.   Antibiotics: Antibiotics if indicated  IV Fluids: IV fluids if indicated  Feeding Tube: Left Blank     PLAN: -Continue current scope of treatment -Continue MS Contin 15 mg every 12 hours.  -Continue Percocet 5-341m every 8 hours as needed for breakthrough pain -Increase OTC bowel regimen.  Could consider trial of Movantik if needed -DNR/DNI -MOST form completed -Follow-up virtual visit in 1 to 2 months  Case and plan discussed with Dr. BRogue Bussing  Patient expressed understanding and was in agreement with this plan. He also understands that He can call the clinic at any time with any questions, concerns, or complaints.     Time Total: 20 minutes  Visit consisted of counseling and education dealing with the complex and emotionally intense issues of symptom management and palliative care in the setting of serious and potentially life-threatening illness.Greater than 50%  of this time  was spent counseling and coordinating care related to the above assessment and plan.  Signed by: JAltha Harm PhD, NP-C

## 2019-07-08 NOTE — Patient Instructions (Addendum)
07/14/19- Wednesday  Taketylenol 325 mg 1 TABLET TWICE DAILY  Take Claritin 10 mg once daily- CAN PICK UP OVER THE COUNTER  montelukast (SINGULAIR) 10 MG tablettake daily for a total of 4 days Dr. B sent this script to the pharmacy  dexamethasone (DECADRON) 4 MG tablettake daily for 2 days prior to your infusion Dr. B sent this script to the pharmacy **THIS IS THE STEROID MEDICATION   07/15/19- Thursday  Taketylenol 325 mg twice a day  Take Claritin 10 mg once daily  montelukast (SINGULAIR) 10 MG tablettake daily for a total of 4 days * Dr. B sent this script to the pharmacy  dexamethasone (DECADRON) 4 MG tablettake daily for 2 days prior to your infusion **Dr. B sent this script to the pharmacy **THIS IS THE STEROID MEDICATION  07/16/2019-Friday- The day of your infusion  Taketylenol 325 mg twice a day  Take Claritin 10 mg once daily  montelukast (SINGULAIR) 10 MG tablettake daily for a total of 4 days * Dr. B sent this script to the pharmacy  Do not takesteriodson the day of infusion   07/17/2019-- Saturday after your infusion  Taketylenol 325 mg twice a day  Take Claritin 10 mg once daily  montelukast (SINGULAIR) 10 MG tablettake daily for a total of 4 days ** Dr. B sent this script to the pharmacy   TAKEacyclovir (ZOVIRAX) 400 MG tablet everyday - This is the medication for shingle prevention!

## 2019-07-09 ENCOUNTER — Other Ambulatory Visit: Payer: Self-pay

## 2019-07-09 ENCOUNTER — Inpatient Hospital Stay: Payer: Medicare Other

## 2019-07-09 ENCOUNTER — Telehealth: Payer: Self-pay

## 2019-07-09 VITALS — BP 114/58 | HR 61 | Temp 97.7°F | Resp 18

## 2019-07-09 DIAGNOSIS — C9110 Chronic lymphocytic leukemia of B-cell type not having achieved remission: Secondary | ICD-10-CM

## 2019-07-09 DIAGNOSIS — Z5112 Encounter for antineoplastic immunotherapy: Secondary | ICD-10-CM | POA: Diagnosis not present

## 2019-07-09 MED ORDER — ACETAMINOPHEN 325 MG PO TABS
650.0000 mg | ORAL_TABLET | Freq: Once | ORAL | Status: AC
  Administered 2019-07-09: 650 mg via ORAL
  Filled 2019-07-09: qty 2

## 2019-07-09 MED ORDER — DIPHENHYDRAMINE HCL 50 MG/ML IJ SOLN
50.0000 mg | Freq: Once | INTRAMUSCULAR | Status: AC
  Administered 2019-07-09: 09:00:00 50 mg via INTRAVENOUS
  Filled 2019-07-09: qty 1

## 2019-07-09 MED ORDER — SODIUM CHLORIDE 0.9 % IV SOLN
900.0000 mg | Freq: Once | INTRAVENOUS | Status: AC
  Administered 2019-07-09: 900 mg via INTRAVENOUS
  Filled 2019-07-09: qty 36

## 2019-07-09 MED ORDER — SODIUM CHLORIDE 0.9 % IV SOLN
Freq: Once | INTRAVENOUS | Status: AC
  Filled 2019-07-09: qty 250

## 2019-07-09 MED ORDER — SODIUM CHLORIDE 0.9 % IV SOLN
20.0000 mg | Freq: Once | INTRAVENOUS | Status: AC
  Administered 2019-07-09: 20 mg via INTRAVENOUS
  Filled 2019-07-09: qty 20

## 2019-07-09 NOTE — Progress Notes (Signed)
Pt tolerated infusion well. No s/s of distress noted. Pt and VS stable at discharge.  

## 2019-07-09 NOTE — Telephone Encounter (Signed)
Lm for patient to call office to go over pre virtual appointment questions.

## 2019-07-10 ENCOUNTER — Other Ambulatory Visit: Payer: Self-pay | Admitting: Nurse Practitioner

## 2019-07-11 NOTE — Progress Notes (Signed)
Unsuccessful attempt to contact patient for Virtual Visit (Pain Management Telehealth)   Patient provided contact information:  [phone] (home); [phone] (mobile); (Preferred) [phone] No e-mail address on record   Pre-screening:  Our staff was successful in contacting James [name] using the above provided information.   I unsuccessfully attempted to make contact with James [name] twice, on 07/12/2019 via telephone. I was unable to complete the virtual encounter due to call going directly to voicemail. I was able to leave a message where I clearly identify myself as Gaspar Cola, MD and I left a message to call us back to reschedule the call.  Pharmacotherapy Assessment  Analgesic: No opioid analgesics prescribed by our practice. Oxycodone/APAP 5/325, 1 tab PO q 8 hrs (15 mg/day of oxycodone IR)  Highest recorded MME/day: 22.5 mg/day MME/day: 22.5 mg/day   Follow-up plan:   Reschedule Visit.     Interventional treatment options: Planned, scheduled, and/or pending:    NOTE: PLAVIX ANTICOAGULATION  (Stop: 7 days  Restart: 2 hrs) Diagnostic bilateral celiac plexus block #1 under fluoroscopic guidance and IV sedation   Under consideration:   Diagnostic bilateral celiac plexus block #1 under fluoroscopic guidance and IV sedation   Therapeutic/palliative (PRN):   None at this time    Recent Visits Date Type Provider Dept  06/15/19 [place], [place], MD [facility]  06/01/19 Procedure visit Milinda Pointer, MD [facility]  05/19/19 Telemedicine Milinda Pointer, [facility]  05/03/19 Office Visit Milinda Pointer, MD [facility]  Showing recent visits within past 90 days and meeting all other requirements   Today's Visits Date Type Provider Dept  07/12/19 Telemedicine Milinda Pointer, MD [facility]  Showing today's visits and meeting all other requirements   Future Appointments No  visits were found meeting these conditions.  Showing future appointments within next 90 days and meeting all other requirements    Note by: Gaspar Cola, MD Date: 07/12/2019; Time: 4:00 PM

## 2019-07-12 ENCOUNTER — Encounter: Payer: Self-pay | Admitting: Pain Medicine

## 2019-07-12 ENCOUNTER — Ambulatory Visit: Payer: Medicare Other | Attending: Pain Medicine | Admitting: Pain Medicine

## 2019-07-12 ENCOUNTER — Other Ambulatory Visit: Payer: Self-pay

## 2019-07-12 ENCOUNTER — Other Ambulatory Visit: Payer: Self-pay | Admitting: *Deleted

## 2019-07-12 DIAGNOSIS — G893 Neoplasm related pain (acute) (chronic): Secondary | ICD-10-CM | POA: Insufficient documentation

## 2019-07-12 DIAGNOSIS — G8929 Other chronic pain: Secondary | ICD-10-CM

## 2019-07-12 DIAGNOSIS — M47816 Spondylosis without myelopathy or radiculopathy, lumbar region: Secondary | ICD-10-CM | POA: Insufficient documentation

## 2019-07-12 DIAGNOSIS — M5137 Other intervertebral disc degeneration, lumbosacral region: Secondary | ICD-10-CM | POA: Insufficient documentation

## 2019-07-12 DIAGNOSIS — Z7901 Long term (current) use of anticoagulants: Secondary | ICD-10-CM

## 2019-07-12 DIAGNOSIS — C9110 Chronic lymphocytic leukemia of B-cell type not having achieved remission: Secondary | ICD-10-CM

## 2019-07-12 DIAGNOSIS — R700 Elevated erythrocyte sedimentation rate: Secondary | ICD-10-CM | POA: Insufficient documentation

## 2019-07-12 DIAGNOSIS — C9192 Lymphoid leukemia, unspecified, in relapse: Secondary | ICD-10-CM

## 2019-07-12 DIAGNOSIS — C8300 Small cell B-cell lymphoma, unspecified site: Secondary | ICD-10-CM

## 2019-07-12 DIAGNOSIS — C9112 Chronic lymphocytic leukemia of B-cell type in relapse: Secondary | ICD-10-CM

## 2019-07-12 DIAGNOSIS — M5134 Other intervertebral disc degeneration, thoracic region: Secondary | ICD-10-CM | POA: Insufficient documentation

## 2019-07-12 DIAGNOSIS — Z5329 Procedure and treatment not carried out because of patient's decision for other reasons: Secondary | ICD-10-CM

## 2019-07-13 MED ORDER — OXYCODONE-ACETAMINOPHEN 5-325 MG PO TABS: ORAL_TABLET | ORAL | 0 refills | Status: DC

## 2019-07-13 NOTE — Telephone Encounter (Signed)
Patient called cancer center requesting refill of oxycodone 5-325 mg tablets every 8-12 hours as needed for pain.  As mandated by the [place] STOP Act (Strengthen Opioid Misuse Prevention), the [place] Controlled Substance Reporting System ([place]) was reviewed for this patient. Below is the past 5-months of controlled substance prescriptions as displayed by the registry. I have personally consulted with my supervising physician, Dr. Rogue Bussing who agrees that continuation of opiate therapy is medically appropriate at this time and agrees to provide continual monitoring, including urine/blood drug screens, as indicated. Refill is appropriate on or after 07/11/19.  [place] reviewed: Reviewed and acceptable.    Faythe Casa, NP 07/13/2019 9:52 AM [phone]

## 2019-07-14 ENCOUNTER — Encounter: Payer: Self-pay | Admitting: Pain Medicine

## 2019-07-15 ENCOUNTER — Other Ambulatory Visit: Payer: Self-pay

## 2019-07-15 ENCOUNTER — Encounter: Payer: Self-pay | Admitting: Internal Medicine

## 2019-07-15 ENCOUNTER — Ambulatory Visit: Payer: Medicare Other | Attending: Pain Medicine | Admitting: Pain Medicine

## 2019-07-15 ENCOUNTER — Telehealth: Payer: Self-pay | Admitting: *Deleted

## 2019-07-15 DIAGNOSIS — G8929 Other chronic pain: Secondary | ICD-10-CM

## 2019-07-15 DIAGNOSIS — Z7901 Long term (current) use of anticoagulants: Secondary | ICD-10-CM

## 2019-07-15 DIAGNOSIS — G893 Neoplasm related pain (acute) (chronic): Secondary | ICD-10-CM

## 2019-07-15 DIAGNOSIS — G894 Chronic pain syndrome: Secondary | ICD-10-CM

## 2019-07-15 DIAGNOSIS — C9110 Chronic lymphocytic leukemia of B-cell type not having achieved remission: Secondary | ICD-10-CM

## 2019-07-15 DIAGNOSIS — R1013 Epigastric pain: Secondary | ICD-10-CM

## 2019-07-15 NOTE — Patient Instructions (Signed)
Intrathecal Pain Pump Information An intrathecal pain pump is a pump that sends medicine into your spinal canal through a thin tube (catheter). You may need an intrathecal pain pump if you have long-term pain that cannot be managed in other ways. In some cases, this pump can be used to deliver medicine that treats long-term muscle spasms (spasticity).  If you are using the pump for only a short time, the pump may be outside your body.  For long-term use, the pump and the catheter are implanted inside your body. The pump is a small round disc and is usually placed under the skin of the abdomen. The catheter will run under your skin from your abdomen to your back. Your health care provider will program the pump to deliver pain medicine into your spinal canal continuously or at certain intervals. You may have a handheld device that allows you to give yourself a dose of pain medicine when needed. The pump runs on a battery. The battery will last 7-10 years, and then the pump will need to be replaced. What are the benefits of an intrathecal pain pump? Opioid pain medicines are strong medicines that are often used to control long-term and severe pain. When these medicines are placed in the fluid space (intrathecal space) that surrounds your spinal cord and brain, the medicine will block the pain signals that come from your body. This type of pump may be an option if you have long-term pain that cannot be managed with opioids that are taken by mouth or by injection. The pump may allow you to have better control of your pain with less medicine. What are the risks of an intrathecal pain pump? Before you can have the pump placed, you will need to go through a pain control trial. Medicine will be placed into your intrathecal space to see if it controls your pain and to see how well you tolerate the medicine. You will need to have a surgical procedure to place the catheter and the pain pump. This surgery has some  risks. There are also risks to long-term use of opioids. Risks of pain pump implantation and use include:  Infection.  Bleeding.  Failure of the pump or the catheter.  Leaking of fluid from your spinal canal (cerebrospinal fluid leak).  A growth around the tip of the catheter inside your spinal canal (granuloma). This can put pressure on your spine.  A need to remove the pump if problems develop.  A need to replace the pump after 7-10 years. Risks of long-term opioid use include:  A breathing problem that prevents you from getting enough oxygen (respiratory depression).  Physical and psychological dependence.  Constipation or difficulty passing urine.  Nausea and vomiting.  Mental changes, including feeling high, dizzy, drowsy, confused, depressed, or anxious. How do I use the pump? Your health care provider will decide what type of pump is best for you. If you have a pump that gives a dose of medicine on demand, you may have a handheld device to trigger the release of your medicine. You will need to see your health care provider often to make sure the pump is programmed correctly for your pain. This programming may need to be changed over time. You will also need to have the pump resupplied with medicine. Your health care provider may do this at your routine visits. You should also know that:  Electronic devices or scans will not damage your pump.  You will need a device identification card  to pass through security gates.  Your device will have an alarm to warn you of a malfunction or low battery power. The alarm will sound if you need a medicine refill. Contact your health care provider if your alarm sounds. Summary  An intrathecal pain pump is a pump that sends medicine into your spinal canal through a thin tube (catheter).  The pump is a small round disc and is usually placed under the skin of the abdomen.  This type of pump may be an option if you have long-term pain  that cannot be managed with opioids that are taken by mouth or by injection.  The pump may allow you to have better control of your pain with less medicine. This information is not intended to replace advice given to you by your health care provider. Make sure you discuss any questions you have with your health care provider. Document Revised: 02/16/2018 Document Reviewed: 02/16/2018 Elsevier Patient Education  2020 Elsevier Inc.  

## 2019-07-15 NOTE — Progress Notes (Signed)
Patient: James [name]  Service Category: E/M  Provider: Gaspar Cola, MD  DOB: February 11, 1951  DOS: 07/15/2019  Location: Office  MRN: [id number]  Setting: Ambulatory outpatient  Referring Provider: [name], MD  Type: Established Patient  Specialty: Interventional Pain Management  PCP: [name], MD  Location: Remote location  Delivery: TeleHealth     Virtual Encounter - Pain Management PROVIDER NOTE: Information contained herein reflects review and annotations entered in association with encounter. Interpretation of such information and data should be left to medically-trained personnel. Information provided to patient can be located elsewhere in the medical record under "Patient Instructions". Document created using STT-dictation technology, any transcriptional errors that may result from process are unintentional.    Contact & Pharmacy Preferred: [phone] Home: [phone] (home) Mobile: [phone] (mobile) E-mail: No e-mail address on record  [facility] [place] [address]) [place] [address] Phone: [phone] Fax: [place], [place] [address] Phone: [phone] Fax: [phone]   Pre-screening  James [name] offered "in-person" vs "virtual" encounter. He indicated preferring virtual for this encounter.   Reason COVID-19*  Social distancing based on CDC and AMA recommendations.   I contacted James [name] on 07/15/2019 via telephone.      I clearly identified myself as Gaspar Cola, MD. I verified that I was speaking with the correct person using two identifiers (Name: James [name], and date of birth: Oct 17, 1950).  Consent I sought verbal advanced consent from James [name] for virtual visit interactions. I informed James [name] of possible security and privacy  concerns, risks, and limitations associated with providing "not-in-person" medical evaluation and management services. I also informed James [name] of the availability of "in-person" appointments. Finally, I informed him that there would be a charge for the virtual visit and that he could be  personally, fully or partially, financially responsible for it. James [name] expressed understanding and agreed to proceed.   Historic Elements   James [name] is a 69 y.o. year old, male patient evaluated today after his last contact with our practice on 07/09/2019. James [name]  has a past medical history of CLL (chronic lymphocytic leukemia) ([place]), Depression, Diabetes mellitus without complication ([place]), Hematuria, Hyperlipidemia, and Hypertension. He also  has a past surgical history that includes Cholecystectomy (1983); Esophagogastroduodenoscopy (egd) with propofol (N/A, 11/20/2016); Cataract extraction w/ intraocular lens implant (Bilateral); Lower Extremity Angiography (Right, 05/19/2017); Esophagogastroduodenoscopy (egd) with propofol (N/A, 10/27/2017); Colonoscopy with propofol (N/A, 10/27/2017); and Lower Extremity Angiography (Left, 08/10/2018). James [name] has a current medication list which includes the following prescription(s): acyclovir, aspirin ec, atorvastatin, calcium plus d3 absorbable, vitamin d3, clopidogrel, dexamethasone, ensure, folic acid, iron polysaccharides, metformin, montelukast, morphine, oxycodone-acetaminophen, and sodium polystyrene. He  reports that he has been smoking cigarettes. He has a 23.50 pack-year smoking history. He has never used smokeless tobacco. He reports current alcohol use of about 1.0 standard drinks of alcohol per week. He reports that he does not use drugs. James [name] has No Known Allergies.   HPI  Today, he is being contacted for follow-up evaluation to follow-up with the results of the lumbar and thoracic x-rays.  In the lumbar spine degenerative changes can be seen including  lumbar facet hypertrophy.  In the thoracic x-rays some mild osteophytes can be observed as well areas degenerative disc disease but no acute abnormalities.  According to the patient, he has been taking some MS Contin 15 mg p.o. twice daily along with oxycodone/APAP 5/325 every 8 hours as needed for pain.  He refers that this is not really helping his epigastric pain.  He refers that he is being told that they cannot attribute the pain that he is experiencing to his cancer problem.  However, he does have a chronic lymphocytic leukemia with a malignant lymphoma for which he is currently undergoing chemotherapy.  CT scans show that he has multiple lymph nodes in multiple places including some degree of splenomegaly.  There is a diagnostic celiac plexus block does not seem to have provided him with any significant long-term relief of the pain but sadly he also did not pay a whole lot of attention to what happened for the duration of the local anesthetic and therefore I did not get any significant information that I could use towards considering a neurolytic block.  Today we looked at several options and he is willing to entertain the possibility of an implant.  At this point I will go ahead and schedule him to do a diagnostic intrathecal injection of an opioid analgesic for the purpose of determining whether or not he would be a good candidate for an intrathecal pump.  The patient indicates agreement with the plan.  Pharmacotherapy Assessment  Analgesic: No opioid analgesics prescribed by our practice. Oxycodone/APAP 5/325, 1 tab PO q 8 hrs (15 mg/day of oxycodone IR)  Highest recorded MME/day: 22.5 mg/day MME/day: 22.5 mg/day   Monitoring: [place] PMP: PDMP reviewed during this encounter.       Pharmacotherapy: No side-effects or adverse reactions reported. Compliance: No problems identified. Effectiveness: Clinically acceptable. Plan: Refer to "POC".  UDS: No results found for: SUMMARY Laboratory Chemistry  Profile   Renal Lab Results  Component Value Date   BUN 32 (H) 07/08/2019   CREATININE 1.21 07/08/2019   GFRAA >60 07/08/2019   GFRNONAA >60 07/08/2019    Hepatic Lab Results  Component Value Date   AST 12 (L) 07/08/2019   ALT 13 07/08/2019   ALBUMIN 4.6 07/08/2019   ALKPHOS 40 07/08/2019   HCVAB NON REACTIVE 06/03/2019   AMYLASE 30 05/11/2019   LIPASE 28 05/11/2019    Electrolytes Lab Results  Component Value Date   NA 137 07/08/2019   K 4.9 07/08/2019   CL 103 07/08/2019   CALCIUM 9.4 07/08/2019   MG 2.4 06/03/2019   PHOS 4.4 06/03/2019    Bone Lab Results  Component Value Date   VD25OH 13.74 (L) 05/11/2019    Inflammation (CRP: Acute Phase) (ESR: Chronic Phase) Lab Results  Component Value Date   CRP 0.6 05/11/2019   ESRSEDRATE 39 (H) 05/11/2019   LATICACIDVEN 1.3 12/28/2018      Note: Above Lab results reviewed.  Imaging  CT CHEST WO CONTRAST CLINICAL DATA:  Restaging CLL. Initial diagnosis 2011. Decreased appetite and weight loss over the past 3-4 months.  EXAM: CT CHEST, ABDOMEN AND PELVIS WITHOUT CONTRAST  TECHNIQUE: Multidetector CT imaging of the chest, abdomen and pelvis was performed following the standard protocol without IV contrast.  COMPARISON:  CT scan 11/17/2018  FINDINGS: CT CHEST FINDINGS  Cardiovascular: The heart is normal in size. No pericardial effusion. There is tortuosity and moderate atherosclerotic calcification involving the thoracic aorta. No focal aneurysm. Branch vessel calcifications including three-vessel coronary artery calcifications are again noted.  Mediastinum/Nodes: Lower neck, supraclavicular, supraclavicular fossa, subpectoral and bilateral axillary adenopathy.  Left supraclavicular lymph node on  image 1/2 measures 11 mm. I do not see this on the prior study.  Right subpectoral lymph node on image 11/2 measures 13 mm. This measures 6 mm on the prior study.  Right axillary lymph node on image 14/2  measures 18.5 mm and previously measured 9 mm.  17 mm left axillary lymph node on image 15/2 previously measured 10 mm.  10.5 mm precarinal lymph node on image 27/2 previously measured 6 mm.  AP window node on image 24/2 measures 9.5 mm and previously measured 5.5 mm.  Lungs/Pleura: Stable mild emphysematous changes. No acute pulmonary findings. No worrisome pulmonary lesions. No enlarged pleural nodes. No pleural effusion.  Musculoskeletal: No chest wall mass. The thyroid gland appears normal. The bony structures are unremarkable. Mild osteoporosis.  CT ABDOMEN PELVIS FINDINGS  Hepatobiliary: No focal hepatic lesions are identified without contrast. Gallbladder is surgically absent. No common bile duct dilatation.  Pancreas: No mass, inflammation or ductal dilatation.  Spleen: Mild splenomegaly. The spleen measures 12.0 x 9.5 x 8.0 cm. No splenic lesions.  Adrenals/Urinary Tract: The adrenal glands and kidneys are unremarkable. There are small bilateral renal calculi and bilateral renal cysts. No worrisome renal lesions. The bladder is unremarkable.  Stomach/Bowel: The stomach, duodenum, small bowel and colon are unremarkable. No acute inflammatory changes, mass lesions or obstructive findings. The terminal ileum and appendix are normal.  Vascular/Lymphatic: Advanced atherosclerotic calcifications involving the aorta and branch vessels. Bilateral iliac artery stents are noted.  Enlarging mesenteric and retroperitoneal lymph nodes.  10.5 mm mesenteric node on image 73/2 is new.  Left-sided retroperitoneal nodal mass measures 3.4 x 2.3 cm on image 79/2. This previously measured less than 1 cm. Right-sided retroperitoneal adenopathy is also noted.  Small bilateral pelvic sidewall lymph nodes are noted. 11 mm obturator lymph node on image 117/2 is new.  No inguinal adenopathy.  Reproductive: The prostate gland is enlarged. There is median lobe hypertrophy  impressing on the base the bladder. The seminal vesicles appear normal.  Other: No free pelvic fluid collections. No inguinal hernia.  Musculoskeletal: No significant bony findings.  IMPRESSION: 1. Significant progression of lymphadenopathy in the lower neck, chest, abdomen and pelvis as detailed above. 2. Mild splenomegaly. 3. Advanced atherosclerotic calcifications involving the thoracic and abdominal aorta and branch vessels including three-vessel coronary artery calcifications. 4. Bilateral renal calculi and renal cysts. 5. Enlarged prostate gland with median lobe hypertrophy impressing on the base the bladder.  Aortic Atherosclerosis (ICD10-I70.0).  Electronically Signed   By: Marijo Sanes M.D.   On: 06/18/2019 14:45 CT Abdomen Pelvis Wo Contrast CLINICAL DATA:  Restaging CLL. Initial diagnosis 2011. Decreased appetite and weight loss over the past 3-4 months.  EXAM: CT CHEST, ABDOMEN AND PELVIS WITHOUT CONTRAST  TECHNIQUE: Multidetector CT imaging of the chest, abdomen and pelvis was performed following the standard protocol without IV contrast.  COMPARISON:  CT scan 11/17/2018  FINDINGS: CT CHEST FINDINGS  Cardiovascular: The heart is normal in size. No pericardial effusion. There is tortuosity and moderate atherosclerotic calcification involving the thoracic aorta. No focal aneurysm. Branch vessel calcifications including three-vessel coronary artery calcifications are again noted.  Mediastinum/Nodes: Lower neck, supraclavicular, supraclavicular fossa, subpectoral and bilateral axillary adenopathy.  Left supraclavicular lymph node on image 1/2 measures 11 mm. I do not see this on the prior study.  Right subpectoral lymph node on image 11/2 measures 13 mm. This measures 6 mm on the prior study.  Right axillary lymph node on image 14/2 measures 18.5 mm and previously measured  9 mm.  17 mm left axillary lymph node on image 15/2 previously measured  10 mm.  10.5 mm precarinal lymph node on image 27/2 previously measured 6 mm.  AP window node on image 24/2 measures 9.5 mm and previously measured 5.5 mm.  Lungs/Pleura: Stable mild emphysematous changes. No acute pulmonary findings. No worrisome pulmonary lesions. No enlarged pleural nodes. No pleural effusion.  Musculoskeletal: No chest wall mass. The thyroid gland appears normal. The bony structures are unremarkable. Mild osteoporosis.  CT ABDOMEN PELVIS FINDINGS  Hepatobiliary: No focal hepatic lesions are identified without contrast. Gallbladder is surgically absent. No common bile duct dilatation.  Pancreas: No mass, inflammation or ductal dilatation.  Spleen: Mild splenomegaly. The spleen measures 12.0 x 9.5 x 8.0 cm. No splenic lesions.  Adrenals/Urinary Tract: The adrenal glands and kidneys are unremarkable. There are small bilateral renal calculi and bilateral renal cysts. No worrisome renal lesions. The bladder is unremarkable.  Stomach/Bowel: The stomach, duodenum, small bowel and colon are unremarkable. No acute inflammatory changes, mass lesions or obstructive findings. The terminal ileum and appendix are normal.  Vascular/Lymphatic: Advanced atherosclerotic calcifications involving the aorta and branch vessels. Bilateral iliac artery stents are noted.  Enlarging mesenteric and retroperitoneal lymph nodes.  10.5 mm mesenteric node on image 73/2 is new.  Left-sided retroperitoneal nodal mass measures 3.4 x 2.3 cm on image 79/2. This previously measured less than 1 cm. Right-sided retroperitoneal adenopathy is also noted.  Small bilateral pelvic sidewall lymph nodes are noted. 11 mm obturator lymph node on image 117/2 is new.  No inguinal adenopathy.  Reproductive: The prostate gland is enlarged. There is median lobe hypertrophy impressing on the base the bladder. The seminal vesicles appear normal.  Other: No free pelvic fluid collections. No  inguinal hernia.  Musculoskeletal: No significant bony findings.  IMPRESSION: 1. Significant progression of lymphadenopathy in the lower neck, chest, abdomen and pelvis as detailed above. 2. Mild splenomegaly. 3. Advanced atherosclerotic calcifications involving the thoracic and abdominal aorta and branch vessels including three-vessel coronary artery calcifications. 4. Bilateral renal calculi and renal cysts. 5. Enlarged prostate gland with median lobe hypertrophy impressing on the base the bladder.  Aortic Atherosclerosis (ICD10-I70.0).  Electronically Signed   By: Marijo Sanes M.D.   On: 06/18/2019 14:45  Assessment  The primary encounter diagnosis was Chronic pain syndrome. Diagnoses of Chronic epigastric pain (1ry area of Pain), CLL (chronic lymphocytic leukemia) ([place]), Cancer-related pain, and Chronic anticoagulation (Plavix) were also pertinent to this visit.  Plan of Care  Problem-specific:  No problem-specific Assessment & Plan notes found for this encounter.  James [name] has a current medication list which includes the following long-term medication(s): atorvastatin, calcium plus d3 absorbable, iron polysaccharides, metformin, and montelukast.  Pharmacotherapy (Medications Ordered): No orders of the defined types were placed in this encounter.  Orders:  Orders Placed This Encounter  Procedures  . Blood Thinner Instructions to Nursing    If the patient requires a Lovenox-bridge therapy, make sure arrangements are made to institute it with the assistance of the PCP.    Standing Status:   Standing    Number of Occurrences:   36    Standing Expiration Date:   01/14/2021    Scheduling Instructions:     Always stop the Plavix (Clopidogrel) x 7-10 days prior to procedure or surgery.  . Intrathecal trial    Standing Status:   Future    Standing Expiration Date:   07/14/2020    Scheduling Instructions:  Please schedule this patient to return as soon as  possible for an intrathecal injection of an opioid analgesic for the purpose of completing an intrathecal pump trial.   Follow-up plan:   Return for Procedure (no sedation): (ML) Intrathecal Pump Trial, (Blood-thinner Protocol).      Interventional treatment options: Planned, scheduled, and/or pending:    NOTE: PLAVIX ANTICOAGULATION  (Stop: 7 days  Restart: 2 hrs)    Under consideration:   Diagnostic intrathecal injection of opioid analgesic (intrathecal pump trial).   Therapeutic/palliative (PRN):   Diagnostic bilateral celiac plexus block completed (patient indicated no relief)    Recent Visits Date Type Provider Dept  07/12/19 Telemedicine Milinda Pointer, MD [facility]  06/15/19 Telemedicine Milinda Pointer, MD [facility]  06/01/19 Procedure visit Milinda Pointer, MD [facility]  05/19/19 Telemedicine Milinda Pointer, MD [facility]  05/03/19 Office Visit Milinda Pointer, MD [facility]  Showing recent visits within past 90 days and meeting all other requirements   Today's Visits Date Type Provider Dept  07/15/19 Telemedicine Milinda Pointer, MD [facility]  Showing today's visits and meeting all other requirements   Future Appointments No visits were found meeting these conditions.  Showing future appointments within next 90 days and meeting all other requirements   I discussed the assessment and treatment plan with the patient. The patient was provided an opportunity to ask questions and all were answered. The patient agreed with the plan and demonstrated an understanding of the instructions.  Patient advised to call back or seek an in-person evaluation if the symptoms or condition worsens.  Duration of encounter: 16 minutes.  Note by: Gaspar Cola, MD Date: 07/15/2019; Time: 3:19 PM

## 2019-07-15 NOTE — Telephone Encounter (Signed)
Tamika, CMA contacted patient prior to his apt with Dr. Rogue Bussing. He is c/o abdominal pain 7/10 scale. He stated that he "almost went the ER yesterday due to the pain" but he is unable to leave his wife due to her failing health." He is contemplating cnl his chemotherapy apt tomorrow due to the pain and being uncomfortable and weakness. Patient offered an apt today in [facility] to have his symptoms address. Patient declined any apts today as he has a virtual visit with pain mgmt within the hour. He was encouraged to keep his apt tomorrow in clinic with Dr. Rogue Bussing.

## 2019-07-16 ENCOUNTER — Ambulatory Visit
Admission: RE | Admit: 2019-07-16 | Discharge: 2019-07-16 | Disposition: A | Discharge status: Home / Self Care | Payer: Medicare Other | Source: Ambulatory Visit | Attending: Internal Medicine | Admitting: Internal Medicine

## 2019-07-16 ENCOUNTER — Inpatient Hospital Stay (HOSPITAL_BASED_OUTPATIENT_CLINIC_OR_DEPARTMENT_OTHER): Payer: Medicare Other | Admitting: Internal Medicine

## 2019-07-16 ENCOUNTER — Other Ambulatory Visit: Payer: Self-pay

## 2019-07-16 ENCOUNTER — Inpatient Hospital Stay: Payer: Medicare Other

## 2019-07-16 VITALS — BP 147/87 | HR 108 | Temp 98.4°F | Wt 140.4 lb

## 2019-07-16 DIAGNOSIS — R1013 Epigastric pain: Secondary | ICD-10-CM | POA: Insufficient documentation

## 2019-07-16 DIAGNOSIS — C9110 Chronic lymphocytic leukemia of B-cell type not having achieved remission: Secondary | ICD-10-CM

## 2019-07-16 DIAGNOSIS — Z5112 Encounter for antineoplastic immunotherapy: Secondary | ICD-10-CM | POA: Diagnosis not present

## 2019-07-16 LAB — CBC WITH DIFFERENTIAL/PLATELET
Abs Immature Granulocytes: 0.04 10*3/uL (ref 0.00–0.07)
Basophils Absolute: 0 10*3/uL (ref 0.0–0.1)
Basophils Relative: 0 %
Eosinophils Absolute: 0 10*3/uL (ref 0.0–0.5)
Eosinophils Relative: 0 %
HCT: 28.3 % — ABNORMAL LOW (ref 39.0–52.0)
Hemoglobin: 8.9 g/dL — ABNORMAL LOW (ref 13.0–17.0)
Immature Granulocytes: 1 %
Lymphocytes Relative: 44 %
Lymphs Abs: 2.9 10*3/uL (ref 0.7–4.0)
MCH: 34.2 pg — ABNORMAL HIGH (ref 26.0–34.0)
MCHC: 31.4 g/dL (ref 30.0–36.0)
MCV: 108.8 fL — ABNORMAL HIGH (ref 80.0–100.0)
Monocytes Absolute: 0.3 10*3/uL (ref 0.1–1.0)
Monocytes Relative: 4 %
Neutro Abs: 3.4 10*3/uL (ref 1.7–7.7)
Neutrophils Relative %: 51 %
Platelets: 126 10*3/uL — ABNORMAL LOW (ref 150–400)
RBC: 2.6 MIL/uL — ABNORMAL LOW (ref 4.22–5.81)
RDW: 16.6 % — ABNORMAL HIGH (ref 11.5–15.5)
WBC: 6.5 10*3/uL (ref 4.0–10.5)
nRBC: 0 % (ref 0.0–0.2)

## 2019-07-16 LAB — HEPATIC FUNCTION PANEL
ALT: 31 U/L (ref 0–44)
AST: 12 U/L — ABNORMAL LOW (ref 15–41)
Albumin: 4.6 g/dL (ref 3.5–5.0)
Alkaline Phosphatase: 48 U/L (ref 38–126)
Bilirubin, Direct: 0.2 mg/dL (ref 0.0–0.2)
Indirect Bilirubin: 0.5 mg/dL (ref 0.3–0.9)
Total Bilirubin: 0.7 mg/dL (ref 0.3–1.2)
Total Protein: 7.2 g/dL (ref 6.5–8.1)

## 2019-07-16 LAB — BASIC METABOLIC PANEL
Anion gap: 11 (ref 5–15)
BUN: 39 mg/dL — ABNORMAL HIGH (ref 8–23)
CO2: 21 mmol/L — ABNORMAL LOW (ref 22–32)
Calcium: 9 mg/dL (ref 8.9–10.3)
Chloride: 103 mmol/L (ref 98–111)
Creatinine, Ser: 1.42 mg/dL — ABNORMAL HIGH (ref 0.61–1.24)
GFR calc Af Amer: 58 mL/min — ABNORMAL LOW (ref >60)
GFR calc non Af Amer: 50 mL/min — ABNORMAL LOW (ref >60)
Glucose, Bld: 200 mg/dL — ABNORMAL HIGH (ref 70–99)
Potassium: 5.1 mmol/L (ref 3.5–5.1)
Sodium: 135 mmol/L (ref 135–145)

## 2019-07-16 LAB — LIPASE, BLOOD: Lipase: 25 U/L (ref 11–51)

## 2019-07-16 LAB — AMYLASE: Amylase: 22 U/L — ABNORMAL LOW (ref 28–100)

## 2019-07-16 LAB — LACTIC ACID, PLASMA: Lactic Acid, Venous: 1.7 mmol/L (ref 0.5–1.9)

## 2019-07-16 NOTE — Progress Notes (Signed)
[facility] [place] OFFICE PROGRESS NOTE  Patient Care Team: [name], MD as PCP - General (Internal Medicine)  Cancer Staging No matching staging information was found for the patient.   Oncology History Overview Note  # 2006- CLL STAGE IV; MAY 2011- WBC- 57K;Platelets-99;Hb-12/CT Bulky LN; BMBx- 80% Invol; del 11; START Benda-Ritux x4 [finished Sep 2011];   # July 2015-Progression; Sep 2015-START ibrutinib; CT scan DEC 2015- Improvement LN; Cont Ibrutinib 147m/d; NOV 2016 CT- 1-2CM LN [mild progression compared to Dec 2015];NOV 2016- FISH peripheral blood- NO MUTATIONS/CD-38 Positive; NOV 7th- CONT IBRUTINIB 2 pills/day; CT AUG 2017- STABLE;  DEC 6th PET- Mild RP LN/ Retrocrural LN  # OFF ibrutinib [? intol]- sep 2019- Jan 16th 2020; Re-start Ibrutinib; September 2020-stop ibrutinib [poor tolerance/worsening anemia]  # Jan 16th 2020- start aranesp; HOLD while on GKeizer   # MARCH 11th 2021-[name]  # DEC 2019- PAIN CONTRACT  # PALLIATIVE CARE- 06/21/2019-  # October 2019-bone marrow biopsy [worsening anemia]-question dyserythropoietic changes/small clone of CLL;  # [facility] HEM- NEG.  SA skin infection [Oct 2016] s/p clinda -------------------------------------------------------    DIAGNOSIS: CLL  STAGE:   IV      ;GOALS: palliative  CURRENT/MOST RECENT THERAPY : Gazyva [C]   CLL (chronic lymphocytic leukemia) ([place]  07/08/2019 -  Chemotherapy   The patient had obinutuzumab (GAZYVA) 100 mg in sodium chloride 0.9 % 100 mL (0.9615 mg/mL) chemo infusion, 100 mg, Intravenous, Once, 1 of 6 cycles Administration: 100 mg (07/08/2019), 900 mg (07/09/2019)  for chemotherapy treatment.      INTERVAL HISTORY:  James CUMPSTON89y.o.  male pleasant patient above progressive CLL and chronic abdominal pain of unclear etiology; anemia- ckd/hyperkalemia currently on does advise here for follow-up.  Patient underwent cycle number 1 day 1-treatment  without any major difficulty.  No acute infusion reaction noted.  Patient however complains of worsening abdominal pain.  Continues to feel poorly.  Worsening fatigue.  Losing weight.  Positive for nausea no vomiting.  Review of Systems  Constitutional: Positive for malaise/fatigue and weight loss. Negative for chills, diaphoresis and fever.  HENT: Negative for nosebleeds and sore throat.   Eyes: Negative for double vision.  Respiratory: Negative for cough, hemoptysis, sputum production, shortness of breath and wheezing.   Cardiovascular: Negative for chest pain, palpitations, orthopnea and leg swelling.  Gastrointestinal: Positive for abdominal pain, constipation and nausea. Negative for blood in stool, diarrhea, heartburn, melena and vomiting.  Genitourinary: Negative for dysuria, frequency and urgency.  Skin: Negative.  Negative for itching and rash.  Neurological: Negative for dizziness, tingling, focal weakness, weakness and headaches.  Endo/Heme/Allergies: Does not bruise/bleed easily.  Psychiatric/Behavioral: Negative for depression. The patient is not nervous/anxious and does not have insomnia.       PAST MEDICAL HISTORY :  Past Medical History:  Diagnosis Date  . CLL (chronic lymphocytic leukemia) ([place]   . Depression   . Diabetes mellitus without complication ([place]   . Hematuria   . Hyperlipidemia   . Hypertension     PAST SURGICAL HISTORY :   Past Surgical History:  Procedure Laterality Date  . CATARACT EXTRACTION W/ INTRAOCULAR LENS IMPLANT Bilateral   . CHOLECYSTECTOMY  1983  . COLONOSCOPY WITH PROPOFOL N/A 10/27/2017   Procedure: COLONOSCOPY WITH PROPOFOL;  Surgeon: EManya Silvas MD;  [facility];  Service: Endoscopy;  Laterality: N/A;  . ESOPHAGOGASTRODUODENOSCOPY (EGD) WITH PROPOFOL N/A 11/20/2016   Procedure: ESOPHAGOGASTRODUODENOSCOPY (EGD) WITH PROPOFOL;  Surgeon: Manya Silvas, MD;  Location[facility]COPY;  Service: Endoscopy;   Laterality: N/A;  . ESOPHAGOGASTRODUODENOSCOPY (EGD) WITH PROPOFOL N/A 10/27/2017   Procedure: ESOPHAGOGASTRODUODENOSCOPY (EGD) WITH PROPOFOL;  Surgeon: Manya Silvas, MD;  Location[facility] ENDOSCOPY;  Service: Endoscopy;  Laterality: N/A;  . LOWER EXTREMITY ANGIOGRAPHY Right 05/19/2017   Procedure: LOWER EXTREMITY ANGIOGRAPHY;  Surgeon: Algernon Huxley, MD;  Location: [place] CV LAB;  Service: Cardiovascular;  Laterality: Right;  . LOWER EXTREMITY ANGIOGRAPHY Left 08/10/2018   Procedure: LOWER EXTREMITY ANGIOGRAPHY;  Surgeon: Algernon Huxley, MD;  Location: [place] CV LAB;  Service: Cardiovascular;  Laterality: Left;    FAMILY HISTORY :   Family History  Problem Relation Age of Onset  . Hypertension Sister     SOCIAL HISTORY:   Social History   Tobacco Use  . Smoking status: Current Every Day Smoker    Packs/day: 0.50    Years: 47.00    Pack years: 23.50    Types: Cigarettes  . Smokeless tobacco: Never Used  Substance Use Topics  . Alcohol use: Yes    Alcohol/week: 1.0 standard drinks    Types: 1 Glasses of wine per week    Comment:  almost none in last 6 months  . Drug use: No    ALLERGIES:  has No Known Allergies.  MEDICATIONS:  Current Outpatient Medications  Medication Sig Dispense Refill  . acyclovir (ZOVIRAX) 400 MG tablet One pill a day [to prevent shingles] 30 tablet 3  . aspirin EC 81 MG tablet Take 81 mg by mouth daily.    Marland Kitchen atorvastatin (LIPITOR) 10 MG tablet Take 1 tablet (10 mg total) by mouth daily. 30 tablet 11  . Calcium Carb-Cholecalciferol (CALCIUM PLUS D3 ABSORBABLE) [phone] MG-UNIT CAPS Take 1 capsule by mouth 2 (two) times daily with a meal. 60 capsule 5  . Cholecalciferol (VITAMIN D3) 125 MCG (5000 UT) CAPS Take 1 capsule (5,000 Units total) by mouth daily with breakfast. Take along with calcium and magnesium. 30 capsule 5  . clopidogrel (PLAVIX) 75 MG tablet Take 1 tablet by mouth once daily 90 tablet 0  . dexamethasone (DECADRON) 4 MG tablet  Start 2 days prior to infusion; Take for 2 days. Do not take on the day of infusion. 60 tablet 3  . Ensure (ENSURE) Take 237 mLs by mouth.    . folic acid (FOLVITE) 1 MG tablet Take 1 tablet (1 mg total) by mouth daily. 30 tablet 1  . iron polysaccharides (NU-IRON) 150 MG capsule Take 1 capsule (150 mg total) by mouth daily. 30 capsule 1  . metFORMIN (GLUCOPHAGE) 1000 MG tablet Take 1,000 mg by mouth daily with breakfast.     . montelukast (SINGULAIR) 10 MG tablet Take 1 tablet (10 mg total) by mouth at bedtime. Start 2 days prior to infusion. Take it for 4 days. 60 tablet 0  . morphine (MS CONTIN) 15 MG 12 hr tablet Take 1 tablet (15 mg total) by mouth every 12 (twelve) hours. 30 tablet 0  . oxyCODONE-acetaminophen (PERCOCET/ROXICET) 5-325 MG tablet 1 pill every 8-12 hours 90 tablet 0  . sodium polystyrene (KAYEXALATE) 15 GM/60ML suspension Take 60 mLs (15 g total) by mouth every 6 (six) hours. Until 2 loose stools 240 mL 0   No current facility-administered medications for this visit.    PHYSICAL EXAMINATION: ECOG PERFORMANCE STATUS: 1 - Symptomatic but completely ambulatory  BP (!) 147/87 (BP Location: Left Arm, Patient Position: Sitting, Cuff Size: Normal)   Pulse Marland Kitchen)  108   Temp 98.4 F (36.9 C) (Oral)   Wt 140 lb 6 oz (63.7 kg)   BMI 21.34 kg/m   Filed Weights   07/16/19 0843  Weight: 140 lb 6 oz (63.7 kg)    Physical Exam  Constitutional: He is oriented to person, place, and time and well-developed, well-nourished, and in no distress.  He is alone.  Appears pale.  HENT:  Head: Normocephalic and atraumatic.  Mouth/Throat: Oropharynx is clear and moist. No oropharyngeal exudate.  Eyes: Pupils are equal, round, and reactive to light.  Cardiovascular: Normal rate and regular rhythm.  Pulmonary/Chest: No respiratory distress. He has no wheezes.  Decreased air entry bil.   Abdominal: Soft. Bowel sounds are normal. He exhibits no distension and no mass. There is no abdominal  tenderness. There is no rebound and no guarding.  Musculoskeletal:        General: No tenderness or edema. Normal range of motion.     Cervical back: Normal range of motion and neck supple.  Neurological: He is alert and oriented to person, place, and time.  Skin: Skin is warm. There is pallor.  Multiple bruises noted in bilateral upper extremity.  Psychiatric: Affect normal.      LABORATORY DATA:  I have reviewed the data as listed    Component Value Date/Time   NA 135 07/16/2019 0825   NA 142 05/03/2014 1139   K 5.1 07/16/2019 0825   K 4.7 05/03/2014 1139   CL 103 07/16/2019 0825   CL 110 (H) 05/03/2014 1139   CO2 21 (L) 07/16/2019 0825   CO2 24 05/03/2014 1139   GLUCOSE 200 (H) 07/16/2019 0825   GLUCOSE 98 05/03/2014 1139   BUN 39 (H) 07/16/2019 0825   BUN 20 (H) 05/03/2014 1139   CREATININE 1.42 (H) 07/16/2019 0825   CREATININE 1.22 08/09/2014 1122   CALCIUM 9.0 07/16/2019 0825   CALCIUM 8.6 05/03/2014 1139   PROT 7.2 07/16/2019 0936   PROT 7.5 05/03/2014 1139   ALBUMIN 4.6 07/16/2019 0936   ALBUMIN 4.1 05/03/2014 1139   AST 12 (L) 07/16/2019 0936   AST 15 05/03/2014 1139   ALT 31 07/16/2019 0936   ALT 26 05/03/2014 1139   ALKPHOS 48 07/16/2019 0936   ALKPHOS 94 05/03/2014 1139   BILITOT 0.7 07/16/2019 0936   BILITOT 0.5 05/03/2014 1139   GFRNONAA 50 (L) 07/16/2019 0825   GFRNONAA >60 08/09/2014 1122   GFRAA 58 (L) 07/16/2019 0825   GFRAA >60 08/09/2014 1122    No results found for: SPEP, UPEP  Lab Results  Component Value Date   WBC 6.5 07/16/2019   NEUTROABS 3.4 07/16/2019   HGB 8.9 (L) 07/16/2019   HCT 28.3 (L) 07/16/2019   MCV 108.8 (H) 07/16/2019   PLT 126 (L) 07/16/2019      Chemistry      Component Value Date/Time   NA 135 07/16/2019 0825   NA 142 05/03/2014 1139   K 5.1 07/16/2019 0825   K 4.7 05/03/2014 1139   CL 103 07/16/2019 0825   CL 110 (H) 05/03/2014 1139   CO2 21 (L) 07/16/2019 0825   CO2 24 05/03/2014 1139   BUN 39 (H)  07/16/2019 0825   BUN 20 (H) 05/03/2014 1139   CREATININE 1.42 (H) 07/16/2019 0825   CREATININE 1.22 08/09/2014 1122      Component Value Date/Time   CALCIUM 9.0 07/16/2019 0825   CALCIUM 8.6 05/03/2014 1139   ALKPHOS 48 07/16/2019 0936   ALKPHOS  94 05/03/2014 1139   AST 12 (L) 07/16/2019 0936   AST 15 05/03/2014 1139   ALT 31 07/16/2019 0936   ALT 26 05/03/2014 1139   BILITOT 0.7 07/16/2019 0936   BILITOT 0.5 05/03/2014 1139       RADIOGRAPHIC STUDIES: I have personally reviewed the radiological images as listed and agreed with the findings in the report. No results found.   ASSESSMENT & PLAN:  CLL (chronic lymphocytic leukemia) ([place]) # CLL/SLL- relapsed most recently on ibrutinib; FEB 2021- CT Ab/Pelvis-CT scan chest and pelvis shows significant progression of axillary/mediastinal/abdominal/pelvic-however the largest lesion approximately inch in size.  Rising white count-suggestive of progressive disease.  Currently on Gazyva; s/p cycle number 1 day 1-2.   # HOLD Gazyva cycle #1; d-8 today. Labs today reviewed;  Acceptable. But see issue below.  #Worsening abdominal pain-chronic abdominal pain progressively getting worse.  Get stat ultrasound of the abdomen.  Check lipase amylase; lactic acid.  #Hyperkalemia/CKD-III-secondary renal insufficiency/creatinine 1.6- [baseline 1.4]. improved. On Loklema.   #Anemia-hemoglobin ~8-9 CKD versus progressive leukemia; STABLE.   Hold Aranesp for now.   # DISPOSITION:  # labs today- LFTs/Lactic acid/lipase/amylase # HOLD chemo today. # STAT US abdomen complete.  # Follow up TBD- Dr.B  Addendum: Patient's blood work reviewed-LFTs; lipase amylase-normal ultrasound abdomen-unremarkable; except for mild right-sided hydronephrosis.  No findings to explain patient's ongoing abdominal pain.  Recommend PET scan for further evaluation.  Also recommend work-up for porphyrias- blood/urine. Will schedule follow up in 1 week.       Orders  Placed This Encounter  Procedures  . US Abdomen Complete    Do not hold patient No prior auth reqd per [place]    Standing Status:   Future    Number of Occurrences:   1    Standing Expiration Date:   07/15/2020    Order Specific Question:   Reason for Exam (SYMPTOM  OR DIAGNOSIS REQUIRED)    Answer:   worsening epigatsric pain/    Order Specific Question:   Preferred imaging location?    Answer:   [facility]    Order Specific Question:   Call Results- Best Contact Number?    Answer:   [phone] - md cell phone  . Lipase, blood    Standing Status:   Future    Number of Occurrences:   1    Standing Expiration Date:   07/15/2020  . Amylase    Standing Status:   Future    Number of Occurrences:   1    Standing Expiration Date:   07/15/2020  . Lactic Acid, Plasma    Standing Status:   Future    Number of Occurrences:   1    Standing Expiration Date:   07/15/2020  . Hepatic function panel    Standing Status:   Future    Number of Occurrences:   1    Standing Expiration Date:   07/15/2020  . Porphyrins, fractionation-plasma    Standing Status:   Future    Standing Expiration Date:   07/18/2020  . Porphobilinogen, 24 hr urine-quant   All questions were answered. The patient knows to call the clinic with any problems, questions or concerns.      Cammie Sickle, MD 07/19/2019 8:43 AM

## 2019-07-16 NOTE — Assessment & Plan Note (Addendum)
#   CLL/SLL- relapsed most recently on ibrutinib; FEB 2021- CT Ab/Pelvis-CT scan chest and pelvis shows significant progression of axillary/mediastinal/abdominal/pelvic-however the largest lesion approximately inch in size.  Rising white count-suggestive of progressive disease.  Currently on Gazyva; s/p cycle number 1 day 1-2.   # HOLD Gazyva cycle #1; d-8 today. Labs today reviewed;  Acceptable. But see issue below.  #Worsening abdominal pain-chronic abdominal pain progressively getting worse.  Get stat ultrasound of the abdomen.  Check lipase amylase; lactic acid.  #Hyperkalemia/CKD-III-secondary renal insufficiency/creatinine 1.6- [baseline 1.4]. improved. On Loklema.   #Anemia-hemoglobin ~8-9 CKD versus progressive leukemia; STABLE.   Hold Aranesp for now.   # DISPOSITION:  # labs today- LFTs/Lactic acid/lipase/amylase # HOLD chemo today. # STAT US abdomen complete.  # Follow up TBD- Dr.B  Addendum: Patient's blood work reviewed-LFTs; lipase amylase-normal ultrasound abdomen-unremarkable; except for mild right-sided hydronephrosis.  No findings to explain patient's ongoing abdominal pain.  Recommend PET scan for further evaluation.  Also recommend work-up for porphyrias- blood/urine. Will schedule follow up in 1 week.

## 2019-07-19 ENCOUNTER — Other Ambulatory Visit: Payer: Self-pay | Admitting: *Deleted

## 2019-07-19 ENCOUNTER — Telehealth: Payer: Self-pay | Admitting: Internal Medicine

## 2019-07-19 DIAGNOSIS — R1013 Epigastric pain: Secondary | ICD-10-CM

## 2019-07-19 DIAGNOSIS — C9110 Chronic lymphocytic leukemia of B-cell type not having achieved remission: Secondary | ICD-10-CM

## 2019-07-19 DIAGNOSIS — C8300 Small cell B-cell lymphoma, unspecified site: Secondary | ICD-10-CM

## 2019-07-19 NOTE — Telephone Encounter (Signed)
H/T-please inform patient that his blood work/ultrasound abdomen-does not show any reason for his abdominal pain.  I would recommend a PET scan for further evaluation.  Also recommend special blood work/urine test.  C-PET scan ASAP; labs-ASAP-urine 24-hour collection; blood work [ordered].  #Follow-up-1 week-MD; labs CBC CMP [order]-Gazyva.

## 2019-07-19 NOTE — Telephone Encounter (Signed)
Per v/o Dr. Norma Fredrickson, random changed to random urine.

## 2019-07-19 NOTE — Telephone Encounter (Signed)
Spoke with patient. Instructions for patient's pet scan prep/date/time for apt discussed with patient. Patient gave verbal understanding. Lab apt for additional lab studies made for patient for Thursday after the pet scan.

## 2019-07-19 NOTE — Telephone Encounter (Signed)
I left a vm for patient to return my phone call.

## 2019-07-19 NOTE — Addendum Note (Signed)
Addended by: Gloris Ham on: 07/19/2019 04:02 PM   Modules accepted: Orders

## 2019-07-20 ENCOUNTER — Telehealth: Payer: Self-pay | Admitting: Internal Medicine

## 2019-07-20 NOTE — Telephone Encounter (Signed)
On 3/22-spoke to patient regarding results of the ultrasound; recommend PET scan; also recommend work-up for porphyria-given unclear abdominal pain.

## 2019-07-22 ENCOUNTER — Encounter
Admission: RE | Admit: 2019-07-22 | Discharge: 2019-07-22 | Disposition: A | Discharge status: Home / Self Care | Payer: Medicare Other | Source: Ambulatory Visit | Attending: Internal Medicine | Admitting: Internal Medicine

## 2019-07-22 ENCOUNTER — Other Ambulatory Visit: Payer: Self-pay

## 2019-07-22 ENCOUNTER — Inpatient Hospital Stay: Payer: Medicare Other

## 2019-07-22 DIAGNOSIS — I700 Atherosclerosis of aorta: Secondary | ICD-10-CM | POA: Insufficient documentation

## 2019-07-22 DIAGNOSIS — C8300 Small cell B-cell lymphoma, unspecified site: Secondary | ICD-10-CM

## 2019-07-22 DIAGNOSIS — Z79899 Other long term (current) drug therapy: Secondary | ICD-10-CM | POA: Insufficient documentation

## 2019-07-22 DIAGNOSIS — C9110 Chronic lymphocytic leukemia of B-cell type not having achieved remission: Secondary | ICD-10-CM

## 2019-07-22 DIAGNOSIS — Z5112 Encounter for antineoplastic immunotherapy: Secondary | ICD-10-CM | POA: Diagnosis not present

## 2019-07-22 LAB — BASIC METABOLIC PANEL
Anion gap: 8 (ref 5–15)
BUN: 26 mg/dL — ABNORMAL HIGH (ref 8–23)
CO2: 23 mmol/L (ref 22–32)
Calcium: 9 mg/dL (ref 8.9–10.3)
Chloride: 110 mmol/L (ref 98–111)
Creatinine, Ser: 1.3 mg/dL — ABNORMAL HIGH (ref 0.61–1.24)
GFR calc Af Amer: >60 mL/min (ref >60)
GFR calc non Af Amer: 55 mL/min — ABNORMAL LOW (ref >60)
Glucose, Bld: 125 mg/dL — ABNORMAL HIGH (ref 70–99)
Potassium: 6.2 mmol/L — ABNORMAL HIGH (ref 3.5–5.1)
Sodium: 141 mmol/L (ref 135–145)

## 2019-07-22 LAB — CBC WITH DIFFERENTIAL/PLATELET
Abs Immature Granulocytes: 0.02 10*3/uL (ref 0.00–0.07)
Basophils Absolute: 0.1 10*3/uL (ref 0.0–0.1)
Basophils Relative: 1 %
Eosinophils Absolute: 0.1 10*3/uL (ref 0.0–0.5)
Eosinophils Relative: 2 %
HCT: 26.8 % — ABNORMAL LOW (ref 39.0–52.0)
Hemoglobin: 8.2 g/dL — ABNORMAL LOW (ref 13.0–17.0)
Immature Granulocytes: 0 %
Lymphocytes Relative: 39 %
Lymphs Abs: 2.4 10*3/uL (ref 0.7–4.0)
MCH: 34.5 pg — ABNORMAL HIGH (ref 26.0–34.0)
MCHC: 30.6 g/dL (ref 30.0–36.0)
MCV: 112.6 fL — ABNORMAL HIGH (ref 80.0–100.0)
Monocytes Absolute: 0.4 10*3/uL (ref 0.1–1.0)
Monocytes Relative: 7 %
Neutro Abs: 3.1 10*3/uL (ref 1.7–7.7)
Neutrophils Relative %: 51 %
Platelets: 137 10*3/uL — ABNORMAL LOW (ref 150–400)
RBC: 2.38 MIL/uL — ABNORMAL LOW (ref 4.22–5.81)
RDW: 16.9 % — ABNORMAL HIGH (ref 11.5–15.5)
WBC: 6.2 10*3/uL (ref 4.0–10.5)
nRBC: 0 % (ref 0.0–0.2)

## 2019-07-22 LAB — GLUCOSE, CAPILLARY: Glucose-Capillary: 128 mg/dL — ABNORMAL HIGH (ref 70–99)

## 2019-07-22 MED ORDER — FLUDEOXYGLUCOSE F - 18 (FDG) INJECTION
7.3000 | Freq: Once | INTRAVENOUS | Status: AC | PRN
  Administered 2019-07-22: 7.65 via INTRAVENOUS

## 2019-07-26 ENCOUNTER — Encounter: Payer: Self-pay | Admitting: Internal Medicine

## 2019-07-26 ENCOUNTER — Inpatient Hospital Stay: Payer: Medicare Other

## 2019-07-26 ENCOUNTER — Inpatient Hospital Stay (HOSPITAL_BASED_OUTPATIENT_CLINIC_OR_DEPARTMENT_OTHER): Payer: Medicare Other | Admitting: Internal Medicine

## 2019-07-26 VITALS — BP 136/84 | HR 84 | Temp 96.4°F | Wt 141.0 lb

## 2019-07-26 VITALS — BP 125/69 | HR 63 | Temp 97.8°F | Resp 18

## 2019-07-26 DIAGNOSIS — G8929 Other chronic pain: Secondary | ICD-10-CM

## 2019-07-26 DIAGNOSIS — R1013 Epigastric pain: Secondary | ICD-10-CM

## 2019-07-26 DIAGNOSIS — C9110 Chronic lymphocytic leukemia of B-cell type not having achieved remission: Secondary | ICD-10-CM

## 2019-07-26 DIAGNOSIS — Z5112 Encounter for antineoplastic immunotherapy: Secondary | ICD-10-CM | POA: Diagnosis not present

## 2019-07-26 LAB — CBC WITH DIFFERENTIAL/PLATELET
Abs Immature Granulocytes: 0.05 10*3/uL (ref 0.00–0.07)
Basophils Absolute: 0 10*3/uL (ref 0.0–0.1)
Basophils Relative: 1 %
Eosinophils Absolute: 0.1 10*3/uL (ref 0.0–0.5)
Eosinophils Relative: 2 %
HCT: 26.2 % — ABNORMAL LOW (ref 39.0–52.0)
Hemoglobin: 8 g/dL — ABNORMAL LOW (ref 13.0–17.0)
Immature Granulocytes: 1 %
Lymphocytes Relative: 35 %
Lymphs Abs: 2.7 10*3/uL (ref 0.7–4.0)
MCH: 34.9 pg — ABNORMAL HIGH (ref 26.0–34.0)
MCHC: 30.5 g/dL (ref 30.0–36.0)
MCV: 114.4 fL — ABNORMAL HIGH (ref 80.0–100.0)
Monocytes Absolute: 0.6 10*3/uL (ref 0.1–1.0)
Monocytes Relative: 8 %
Neutro Abs: 4.2 10*3/uL (ref 1.7–7.7)
Neutrophils Relative %: 53 %
Platelets: 135 10*3/uL — ABNORMAL LOW (ref 150–400)
RBC: 2.29 MIL/uL — ABNORMAL LOW (ref 4.22–5.81)
RDW: 16.4 % — ABNORMAL HIGH (ref 11.5–15.5)
WBC: 7.7 10*3/uL (ref 4.0–10.5)
nRBC: 0 % (ref 0.0–0.2)

## 2019-07-26 LAB — BASIC METABOLIC PANEL
Anion gap: 7 (ref 5–15)
BUN: 32 mg/dL — ABNORMAL HIGH (ref 8–23)
CO2: 24 mmol/L (ref 22–32)
Calcium: 9.1 mg/dL (ref 8.9–10.3)
Chloride: 108 mmol/L (ref 98–111)
Creatinine, Ser: 1.31 mg/dL — ABNORMAL HIGH (ref 0.61–1.24)
GFR calc Af Amer: >60 mL/min (ref >60)
GFR calc non Af Amer: 55 mL/min — ABNORMAL LOW (ref >60)
Glucose, Bld: 134 mg/dL — ABNORMAL HIGH (ref 70–99)
Potassium: 5.5 mmol/L — ABNORMAL HIGH (ref 3.5–5.1)
Sodium: 139 mmol/L (ref 135–145)

## 2019-07-26 MED ORDER — DIPHENHYDRAMINE HCL 50 MG/ML IJ SOLN
50.0000 mg | Freq: Once | INTRAMUSCULAR | Status: AC
  Administered 2019-07-26: 50 mg via INTRAVENOUS
  Filled 2019-07-26: qty 1

## 2019-07-26 MED ORDER — SODIUM CHLORIDE 0.9 % IV SOLN
Freq: Once | INTRAVENOUS | Status: AC
  Filled 2019-07-26: qty 250

## 2019-07-26 MED ORDER — SODIUM CHLORIDE 0.9 % IV SOLN
20.0000 mg | Freq: Once | INTRAVENOUS | Status: AC
  Administered 2019-07-26: 20 mg via INTRAVENOUS
  Filled 2019-07-26: qty 20

## 2019-07-26 MED ORDER — MONTELUKAST SODIUM 10 MG PO TABS
10.0000 mg | ORAL_TABLET | Freq: Once | ORAL | Status: AC
  Administered 2019-07-26: 10 mg via ORAL
  Filled 2019-07-26: qty 1

## 2019-07-26 MED ORDER — ACETAMINOPHEN 325 MG PO TABS
650.0000 mg | ORAL_TABLET | Freq: Once | ORAL | Status: AC
  Administered 2019-07-26: 650 mg via ORAL
  Filled 2019-07-26: qty 2

## 2019-07-26 MED ORDER — SODIUM CHLORIDE 0.9 % IV SOLN
1000.0000 mg | Freq: Once | INTRAVENOUS | Status: AC
  Administered 2019-07-26: 1000 mg via INTRAVENOUS
  Filled 2019-07-26: qty 40

## 2019-07-26 NOTE — Progress Notes (Signed)
Should there cone James [name]  Patient Care Team: James Harrier, MD as PCP - General (Internal Medicine)  Cancer Staging No matching staging information was found for the patient.   Oncology History Overview [name]  # 2006- CLL STAGE IV; MAY 2011- WBC- 57K;Platelets-99;Hb-12/CT Bulky LN; BMBx- 80% Invol; del 11; START Benda-Ritux x4 [finished Sep 2011];   # July 2015-Progression; Sep 2015-START ibrutinib; CT scan DEC 2015- Improvement LN; Cont Ibrutinib 175m/d; NOV 2016 CT- 1-2CM LN [mild progression compared to Dec 2015];NOV 2016- FISH peripheral blood- NO MUTATIONS/CD-38 Positive; NOV 7th- CONT IBRUTINIB 2 pills/day; CT AUG 2017- STABLE;  DEC 6th PET- Mild RP LN/ Retrocrural LN  # OFF ibrutinib [? intol]- sep 2019- Jan 16th 2020; Re-start Ibrutinib; September 2020-stop ibrutinib [poor tolerance/worsening anemia]  # Jan 16th 2020- start aranesp; HOLD [place]   # MARCH 11th 2021-James [name]  # DEC 2019- PAIN CONTRACT  # PALLIATIVE CARE- 06/21/2019-  # October 2019-bone marrow biopsy [worsening anemia]-question dyserythropoietic changes/small clone of CLL;  # [facility] HEM- NEG.  SA skin infection [Oct 2016] s/p clinda -------------------------------------------------------    DIAGNOSIS: CLL  STAGE:   IV      ;GOALS: palliative  CURRENT/MOST RECENT THERAPY : Gazyva [C]   [place]  07/08/2019 -  Chemotherapy   The patient had obinutuzumab (GAZYVA) 100 mg in sodium chloride 0.9 % 100 mL (0.9615 mg/mL) chemo infusion, 100 mg, Intravenous, Once, 1 of 6 cycles Administration: 100 mg (07/08/2019), 900 mg (07/09/2019)  for chemotherapy treatment.      INTERVAL HISTORY:  James [id number].  [name] pleasant patient above progressive CLL and chronic abdominal pain of unclear etiology; anemia- ckd/hyperkalemia currently GDyann Kiefis here to review the result the PET scan  Patient underwent cycle number 1 day  1-treatment without any major difficulty patient's infusion was held [Durannumber 1 day 8] because of hyperkalemia/worsening abdominal pain.  Work-up including ultrasound abdomen negative.  PET scan was ordered.  Patient continues abdominal pain.  He stopped taking his Lokelma as it was causing abdominal pain.  He did not reach out to nephrology.   Review of Systems  Constitutional: Positive for malaise/fatigue and weight loss. Negative for chills, diaphoresis and fever.  HENT: Negative for nosebleeds and sore throat.   Eyes: Negative for double vision.  Respiratory: Negative for cough, hemoptysis, sputum production, shortness of breath and wheezing.   Cardiovascular: Negative for chest pain, palpitations, orthopnea and leg swelling.  Gastrointestinal: Positive for abdominal pain, constipation and nausea. Negative for blood in stool, diarrhea, heartburn, melena and vomiting.  Genitourinary: Negative for dysuria, frequency and urgency.  Skin: Negative.  Negative for itching and rash.  Neurological: Negative for dizziness, tingling, focal weakness, weakness and headaches.  Endo/Heme/Allergies: Does not bruise/bleed easily.  Psychiatric/Behavioral: Negative for depression. The patient is not nervous/anxious and does not have insomnia.       PAST MEDICAL HISTORY :  Past Medical History:  Diagnosis Date  . CLL (ch[place]ocytic [place]) [place]   . Depression   . Diabetes mellitus without complication ([place]   . Hematuria   . Hyperlipidemia   . Hypertension     PAST SURGICAL HISTORY :   Past Surgical History:  Procedure Laterality Date  . CATARACT EXTRACTION W/ INTRAOCULAR LENS IMPLANT Bilateral   . CHOLECYSTECTOMY  1983  . COLONOSCOPY WITH PROPOFOL N/A 10/27/2017   Procedure: COLONOSCOPY WITH PROPOFOL;  Surgeon: EManya Silvas MD;  Location:  [facility];  Service: Endoscopy;  Laterality: N/A;  . ESOPHAGOGASTRODUODENOSCOPY (EGD) WITH PROPOFOL N/A 11/20/2016   Procedure:  ESOPHAGOGASTRODUODENOSCOPY (EGD) WITH PROPOFOL;  Surgeon: Manya Silvas, MD;  Location[facility] ENDOSCOPY;  Service: Endoscopy;  Laterality: N/A;  . ESOPHAGOGASTRODUODENOSCOPY (EGD) WITH PROPOFOL N/A 10/27/2017   Procedure: ESOPHAGOGASTRODUODENOSCOPY (EGD) WITH PROPOFOL;  Surgeon: Manya Silvas, MD;  Location[facility] END[facility]COPY;  Service: Endoscopy;  Laterality: N/A;  . LOWER EXTREMITY ANGIOGRAPHY Right 05/19/2017   Procedure: LOWER EXTREMITY ANGIOGRAPHY;  Surgeon: Algernon Huxley, MD;  Location: [facility] CV LAB;  Service: Cardiovascular;  Laterality: Right;  . LOWER EXTREMITY ANGIOGRAPHY Left 08/10/2018   Procedure: LOWER EXTREMITY ANGIOGRAPHY;  Surgeon: Algernon Huxley, MD;  Location: [facility] CV LAB;  Service: Cardiovascular;  Laterality: Left;    FAMILY HISTORY :   Family History  Problem Relation Age of Onset  . Hypertension Sister     SOCIAL HISTORY:   Social History   Tobacco Use  . Smoking status: Current Every Day Smoker    Packs/day: 0.50    Years: 47.00    Pack years: 23.50    Types: Cigarettes  . Smokeless tobacco: Never Used  Substance Use Topics  . Alcohol use: Yes    Alcohol/week: 1.0 standard drinks    Types: 1 Glasses of wine per week    Comment:  almost none in last 6 months  . Drug use: No    ALLERGIES:  has No Known Allergies.  MEDICATIONS:  Current Outpatient Medications  Medication Sig Dispense Refill  . acyclovir (ZOVIRAX) 400 MG tablet One pill a day [to prevent shingles] 30 tablet 3  . aspirin EC 81 MG tablet Take 81 mg by mouth daily.    Marland Kitchen atorvastatin (LIPITOR) 10 MG tablet Take 1 tablet (10 mg total) by mouth daily. 30 tablet 11  . Calcium Carb-Cholecalciferol (CALCIUM PLUS D3 ABSORBABLE) [phone] MG-UNIT CAPS Take 1 capsule by mouth 2 (two) times daily with a meal. 60 capsule 5  . Cholecalciferol (VITAMIN D3) 125 MCG (5000 UT) CAPS Take 1 capsule (5,000 Units total) by mouth daily with breakfast. Take along with calcium and magnesium. 30  capsule 5  . clopidogrel (PLAVIX) 75 MG tablet Take 1 tablet by mouth once daily 90 tablet 0  . dexamethasone (DECADRON) 4 MG tablet Start 2 days prior to infusion; Take for 2 days. Do not take on the day of infusion. 60 tablet 3  . Ensure (ENSURE) Take 237 mLs by mouth.    . folic acid (FOLVITE) 1 MG tablet Take 1 tablet (1 mg total) by mouth daily. 30 tablet 1  . iron polysaccharides (NU-IRON) 150 MG capsule Take 1 capsule (150 mg total) by mouth daily. 30 capsule 1  . metFORMIN (GLUCOPHAGE) 1000 MG tablet Take 1,000 mg by mouth daily with breakfast.     . montelukast (SINGULAIR) 10 MG tablet Take 1 tablet (10 mg total) by mouth at bedtime. Start 2 days prior to infusion. Take it for 4 days. 60 tablet 0  . morphine (MS CONTIN) 15 MG 12 hr tablet Take 1 tablet (15 mg total) by mouth every 12 (twelve) hours. 30 tablet 0  . oxyCODONE-acetaminophen (PERCOCET/ROXICET) 5-325 MG tablet 1 pill every 8-12 hours 90 tablet 0  . sodium polystyrene (KAYEXALATE) 15 GM/60ML suspension Take 60 mLs (15 g total) by mouth every 6 (six) hours. Until 2 loose stools 240 mL 0   No current facility-administered medications for this visit.   Facility-Administered Medications Ordered in Other  Visits  Medication Dose Route Frequency Provider Last Rate Last Admin  . obinutuzumab (GAZYVA) 1,000 mg in sodium chloride 0.9 % 250 mL (3.4483 mg/mL) chemo infusion  1,000 mg Intravenous Once Cammie Sickle, MD        PHYSICAL EXAMINATION: ECOG PERFORMANCE STATUS: 1 - Symptomatic but completely ambulatory  BP 136/84 (BP Location: Left Arm, Patient Position: Sitting, Cuff Size: Normal)   Pulse 84   Temp (!) 96.4 F (35.8 C) (Tympanic)   Wt 141 lb (64 kg)   BMI 21.44 kg/m   Filed Weights   07/26/19 0907  Weight: 141 lb (64 kg)    Physical Exam  Constitutional: He is oriented to person, place, and time and well-developed, well-nourished, and in no distress.  He is alone.  Appears pale.  HENT:  Head:  Normocephalic and atraumatic.  Mouth/Throat: Oropharynx is clear and moist. No oropharyngeal exudate.  Eyes: Pupils are equal, round, and reactive to light.  Cardiovascular: Normal rate and regular rhythm.  Pulmonary/Chest: No respiratory distress. He has no wheezes.  Decreased air entry bil.   Abdominal: Soft. Bowel sounds are normal. He exhibits no distension and no mass. There is no abdominal tenderness. There is no rebound and no guarding.  Musculoskeletal:        General: No tenderness or edema. Normal range of motion.     Cervical back: Normal range of motion and neck supple.  Neurological: He is alert and oriented to person, place, and time.  Skin: Skin is warm. There is pallor.  Multiple bruises noted in bilateral upper extremity.  Psychiatric: Affect normal.      LABORATORY DATA:  I have reviewed the data as listed    Component Value Date/Time   NA 139 07/26/2019 0816   NA 142 05/03/2014 1139   K 5.5 (H) 07/26/2019 0816   K 4.7 05/03/2014 1139   CL 108 07/26/2019 0816   CL 110 (H) 05/03/2014 1139   CO2 24 07/26/2019 0816   CO2 24 05/03/2014 1139   GLUCOSE 134 (H) 07/26/2019 0816   GLUCOSE 98 05/03/2014 1139   BUN 32 (H) 07/26/2019 0816   BUN 20 (H) 05/03/2014 1139   CREATININE 1.31 (H) 07/26/2019 0816   CREATININE 1.22 08/09/2014 1122   CALCIUM 9.1 07/26/2019 0816   CALCIUM 8.6 05/03/2014 1139   PROT 7.2 07/16/2019 0936   PROT 7.5 05/03/2014 1139   ALBUMIN 4.6 07/16/2019 0936   ALBUMIN 4.1 05/03/2014 1139   AST 12 (L) 07/16/2019 0936   AST 15 05/03/2014 1139   ALT 31 07/16/2019 0936   ALT 26 05/03/2014 1139   ALKPHOS 48 07/16/2019 0936   ALKPHOS 94 05/03/2014 1139   BILITOT 0.7 07/16/2019 0936   BILITOT 0.5 05/03/2014 1139   GFRNONAA 55 (L) 07/26/2019 0816   GFRNONAA >60 08/09/2014 1122   GFRAA >60 07/26/2019 0816   GFRAA >60 08/09/2014 1122    No results found for: SPEP, UPEP  Lab Results  Component Value Date   WBC 7.7 07/26/2019   NEUTROABS 4.2  07/26/2019   HGB 8.0 (L) 07/26/2019   HCT 26.2 (L) 07/26/2019   MCV 114.4 (H) 07/26/2019   PLT 135 (L) 07/26/2019      Chemistry      Component Value Date/Time   NA 139 07/26/2019 0816   NA 142 05/03/2014 1139   K 5.5 (H) 07/26/2019 0816   K 4.7 05/03/2014 1139   CL 108 07/26/2019 0816   CL 110 (H) 05/03/2014 1139  CO2 24 07/26/2019 0816   CO2 24 05/03/2014 1139   BUN 32 (H) 07/26/2019 0816   BUN 20 (H) 05/03/2014 1139   CREATININE 1.31 (H) 07/26/2019 0816   CREATININE 1.22 08/09/2014 1122      Component Value Date/Time   CALCIUM 9.1 07/26/2019 0816   CALCIUM 8.6 05/03/2014 1139   ALKPHOS 48 07/16/2019 0936   ALKPHOS 94 05/03/2014 1139   AST 12 (L) 07/16/2019 0936   AST 15 05/03/2014 1139   ALT 31 07/16/2019 0936   ALT 26 05/03/2014 1139   BILITOT 0.7 07/16/2019 0936   BILITOT 0.5 05/03/2014 1139       RADIOGRAPHIC STUDIES: I have personally reviewed the radiological images as listed and agreed with the findings in the report. No results found.   ASSESSMENT & PLAN:  CLL (chronic lymphocytic leukemia) ([place]) # CLL/SLL- relapsed most recently on ibrutinib; FEB 2021- CT Ab/Pelvis-CT scan chest and pelvis shows significant progression of axillary/mediastinal/abdominal/pelvic-however the largest lesion approximately inch in size. MARCH 2021- ~ 2.5 cm RP LN; multiple smaller LN.  Currently on Gazyva; s/p cycle number 1 day 1-2.   # proceed with Gazyva cycle #1; d-8 today. Labs today reviewed;  Acceptable. But see issue below.  #Chronic abdominal pain-unclear etiology- STABLE: awaiting porphyrias work up.   #Hyperkalemia/CKD-III-secondary renal insufficiency/creatinine 1.3- [baseline 1.4].; K-5.5- stopped sec to abdominal pain.  Reminded the patient that he should reach out to nephrology ASAP  #Anemia-hemoglobin ~8.  CKD versus progressive leukemia; Stable; monitor for now.   # DISPOSITION:  # chemo today # 1 week- MD; labs- cbc/bmp;iron studies/ferritin;LDH;HOLD  tube;Gazyva-Dr.B        Orders Placed This Encounter  Procedures  . Miscellaneous [facility] test (send-out)    Standing Status:   Future    Standing Expiration Date:   07/25/2020    Order Specific Question:   Test name / description:    Answer:   # 24 hour urine collection-quantitative porphyrins# test [id number]  . Miscellaneous [facility] test (send-out)    Standing Status:   Future    Standing Expiration Date:   07/25/2020    Order Specific Question:   Test name / description:    Answer:   Porphyrins, Quantitative, Random Urine- TEST: [id number]  . CBC with Differential    Standing Status:   Future    Standing Expiration Date:   07/25/2020  . Basic metabolic panel    Standing Status:   Future    Standing Expiration Date:   07/25/2020  . Lactate dehydrogenase    Standing Status:   Future    Standing Expiration Date:   07/25/2020  . Ferritin    Standing Status:   Future    Standing Expiration Date:   07/25/2020  . Iron and TIBC    Standing Status:   Future    Standing Expiration Date:   07/25/2020  . Hold Tube- Blood Bank    Standing Status:   Future    Standing Expiration Date:   07/25/2020   All questions were answered. The patient knows to call the clinic with any problems, questions or concerns.      Cammie Sickle, MD 07/26/2019 11:12 AM

## 2019-07-26 NOTE — Assessment & Plan Note (Addendum)
#   CLL/SLL- relapsed most recently on ibrutinib; FEB 2021- CT Ab/Pelvis-CT scan chest and pelvis shows significant progression of axillary/mediastinal/abdominal/pelvic-however the largest lesion approximately inch in size. MARCH 2021- ~ 2.5 cm RP LN; multiple smaller LN.  Currently on Gazyva; s/p cycle number 1 day 1-2.   # proceed with Gazyva cycle #1; d-8 today. Labs today reviewed;  Acceptable. But see issue below.  #Chronic abdominal pain-unclear etiology- STABLE: awaiting porphyrias work up.   #Hyperkalemia/CKD-III-secondary renal insufficiency/creatinine 1.3- [baseline 1.4].; K-5.5- stopped sec to abdominal pain.  Reminded the patient that he should reach out to nephrology ASAP  #Anemia-hemoglobin ~8.  CKD versus progressive leukemia; Stable; monitor for now.   # DISPOSITION:  # chemo today # 1 week- MD; labs- cbc/bmp;iron studies/ferritin;LDH;HOLD tube;Gazyva-Dr.B

## 2019-07-27 ENCOUNTER — Other Ambulatory Visit: Payer: Self-pay | Admitting: *Deleted

## 2019-07-27 ENCOUNTER — Ambulatory Visit (INDEPENDENT_AMBULATORY_CARE_PROVIDER_SITE_OTHER): Payer: Medicare Other

## 2019-07-27 ENCOUNTER — Encounter (INDEPENDENT_AMBULATORY_CARE_PROVIDER_SITE_OTHER): Payer: Self-pay | Admitting: Vascular Surgery

## 2019-07-27 ENCOUNTER — Other Ambulatory Visit: Payer: Self-pay

## 2019-07-27 ENCOUNTER — Ambulatory Visit (INDEPENDENT_AMBULATORY_CARE_PROVIDER_SITE_OTHER): Payer: Medicare Other | Admitting: Vascular Surgery

## 2019-07-27 VITALS — BP 120/75 | HR 89 | Ht 68.0 in | Wt 142.0 lb

## 2019-07-27 DIAGNOSIS — I70219 Atherosclerosis of native arteries of extremities with intermittent claudication, unspecified extremity: Secondary | ICD-10-CM

## 2019-07-27 DIAGNOSIS — E119 Type 2 diabetes mellitus without complications: Secondary | ICD-10-CM | POA: Diagnosis not present

## 2019-07-27 DIAGNOSIS — C8300 Small cell B-cell lymphoma, unspecified site: Secondary | ICD-10-CM

## 2019-07-27 DIAGNOSIS — I10 Essential (primary) hypertension: Secondary | ICD-10-CM

## 2019-07-27 DIAGNOSIS — E785 Hyperlipidemia, unspecified: Secondary | ICD-10-CM | POA: Diagnosis not present

## 2019-07-27 MED ORDER — MORPHINE SULFATE ER 15 MG PO TBCR: 15.0000 mg | EXTENDED_RELEASE_TABLET | Freq: Two times a day (BID) | ORAL | 0 refills | Status: DC

## 2019-07-27 NOTE — Assessment & Plan Note (Signed)
His ABIs today are in the normal range at 1.01 on the right and 0.97 on the left with strong triphasic waveforms and normal digital pressures.  Leg symptoms are much better after intervention.  Plan to see him back in 6 months with noninvasive studies

## 2019-07-27 NOTE — Progress Notes (Signed)
MRN : [id number]  James [name] is a 69 y.o. (05/18/1950) male who presents with chief complaint of  Chief Complaint  Patient presents with  . Follow-up    U/S Follow up  .  History of Present Illness: Patient returns today in follow up of his PAD.  He is having a lot of issues with abdominal pain in his chemotherapy.  He is getting treatment to the cancer center and this has been a difficult process for him.  His legs are actually doing pretty well.  No new ulceration or infection.  No rest pain or disabling claudication.  His ABIs today are in the normal range at 1.01 on the right and 0.97 on the left with strong triphasic waveforms and normal digital pressures.  Current Outpatient Medications  Medication Sig Dispense Refill  . acyclovir (ZOVIRAX) 400 MG tablet One pill a day [to prevent shingles] 30 tablet 3  . atorvastatin (LIPITOR) 10 MG tablet Take 1 tablet (10 mg total) by mouth daily. 30 tablet 11  . Calcium Carb-Cholecalciferol (CALCIUM PLUS D3 ABSORBABLE) [id number] MG-UNIT CAPS Take 1 capsule by mouth 2 (two) times daily with a meal. 60 capsule 5  . Cholecalciferol (VITAMIN D3) 125 MCG (5000 UT) CAPS Take 1 capsule (5,000 Units total) by mouth daily with breakfast. Take along with calcium and magnesium. 30 capsule 5  . clopidogrel (PLAVIX) 75 MG tablet Take 1 tablet by mouth once daily 90 tablet 0  . dexamethasone (DECADRON) 4 MG tablet Start 2 days prior to infusion; Take for 2 days. Do not take on the day of infusion. 60 tablet 3  . Ensure (ENSURE) Take 237 mLs by mouth.    . folic acid (FOLVITE) 1 MG tablet Take 1 tablet (1 mg total) by mouth daily. 30 tablet 1  . iron polysaccharides (NU-IRON) 150 MG capsule Take 1 capsule (150 mg total) by mouth daily. 30 capsule 1  . metFORMIN (GLUCOPHAGE) 1000 MG tablet Take 1,000 mg by mouth daily with breakfast.     . morphine (MS CONTIN) 15 MG 12 hr tablet Take 1 tablet (15 mg total) by mouth every 12 (twelve) hours. 30 tablet 0  .  oxyCODONE-acetaminophen (PERCOCET/ROXICET) 5-325 MG tablet 1 pill every 8-12 hours 90 tablet 0  . aspirin EC 81 MG tablet Take 81 mg by mouth daily.    . montelukast (SINGULAIR) 10 MG tablet Take 1 tablet (10 mg total) by mouth at bedtime. Start 2 days prior to infusion. Take it for 4 days. (Patient not taking: Reported on 07/27/2019) 60 tablet 0  . sodium polystyrene (KAYEXALATE) 15 GM/60ML suspension Take 60 mLs (15 g total) by mouth every 6 (six) hours. Until 2 loose stools (Patient not taking: Reported on 07/27/2019) 240 mL 0   No current facility-administered medications for this visit.    Past Medical History:  Diagnosis Date  . CLL (chronic lymphocytic leukemia) ([place])   . Depression   . Diabetes mellitus without complication ([place])   . Hematuria   . Hyperlipidemia   . Hypertension     Past Surgical History:  Procedure Laterality Date  . CATARACT EXTRACTION W/ INTRAOCULAR LENS IMPLANT Bilateral   . CHOLECYSTECTOMY  1983  . COLONOSCOPY WITH PROPOFOL N/A 10/27/2017   Procedure: COLONOSCOPY WITH PROPOFOL;  Surgeon: Manya Silvas, MD;  Location: [facility] ENDOSCOPY;  Service: Endoscopy;  Laterality: N/A;  . ESOPHAGOGASTRODUODENOSCOPY (EGD) WITH PROPOFOL N/A 11/20/2016   Procedure: ESOPHAGOGASTRODUODENOSCOPY (EGD) WITH PROPOFOL;  Surgeon: Manya Silvas, MD;  Location: [facility] ENDOSCOPY;  Service: Endoscopy;  Laterality: N/A;  . ESOPHAGOGASTRODUODENOSCOPY (EGD) WITH PROPOFOL N/A 10/27/2017   Procedure: ESOPHAGOGASTRODUODENOSCOPY (EGD) WITH PROPOFOL;  Surgeon: Manya Silvas, MD;  Location: [facility] ENDOSCOPY;  Service: Endoscopy;  Laterality: N/A;  . LOWER EXTREMITY ANGIOGRAPHY Right 05/19/2017   Procedure: LOWER EXTREMITY ANGIOGRAPHY;  Surgeon: Algernon Huxley, MD;  Location: [place] CV LAB;  Service: Cardiovascular;  Laterality: Right;  . LOWER EXTREMITY ANGIOGRAPHY Left 08/10/2018   Procedure: LOWER EXTREMITY ANGIOGRAPHY;  Surgeon: Algernon Huxley, MD;  Location: [place] CV LAB;   Service: Cardiovascular;  Laterality: Left;    Social History        Tobacco Use  . Smoking status: Current Every Day Smoker    Packs/day: 0.50    Years: 47.00    Pack years: 23.50    Types: Cigarettes  . Smokeless tobacco: Never Used  Substance Use Topics  . Alcohol use: Yes    Alcohol/week: 1.0 standard drinks    Types: 1 Glasses of wine per week    Comment:  almost none in last 6 months  . Drug use: No    Family History      Family History  Problem Relation Age of Onset  . Hypertension Sister   no bleeding disorders, clotting disorders, or aneurysms  No Known Allergies  REVIEW OF SYSTEMS(Negative unless checked)  Constitutional: [x] ?????Weight loss[] ?????Fever[] ?????Chills Cardiac:[] ?????Chest pain[] ?????Chest pressure[] ?????Palpitations [] ?????Shortness of breath when laying flat [] ?????Shortness of breath at rest [] ?????Shortness of breath with exertion. Vascular: [x] ?????Pain in legs with walking[] ?????Pain in legsat rest[] ?????Pain in legs when laying flat [x] ?????Claudication [] ?????Pain in feet when walking [] ?????Pain in feet at rest [] ?????Pain in feet when laying flat [] ?????History of DVT [] ?????Phlebitis [x] ?????Swelling in legs [] ?????Varicose veins [x] ?????Non-healing ulcers Pulmonary: [] ?????Uses home oxygen [] ?????Productive cough[] ?????Hemoptysis [] ?????Wheeze [] ?????COPD [] ?????Asthma Neurologic: [] ?????Dizziness [] ?????Blackouts [] ?????Seizures [] ?????History of stroke [] ?????History of TIA[] ?????Aphasia [] ?????Temporary blindness[] ?????Dysphagia [] ?????Weaknessor numbness in arms [] ?????Weakness or numbnessin legs Musculoskeletal: [x] ?????Arthritis [] ?????Joint swelling [] ?????Joint pain [] ?????Low back pain Hematologic:[] ?????Easy bruising[] ?????Easy bleeding [] ?????Hypercoagulable state [x] ?????Anemic  Gastrointestinal:[] ?????Blood in  stool[] ?????Vomiting blood[x] ?????Gastroesophageal reflux/heartburn[] ?????Abdominal pain Genitourinary: [] ?????Chronic kidney disease [] ?????Difficulturination [] ?????Frequenturination [] ?????Burning with urination[] ?????Hematuria Skin: [] ?????Rashes [x] ?????Ulcers [x] ?????Wounds Psychological: [] ?????History of anxiety[] ?????History of major depression.    Physical Examination  BP 120/75   Pulse 89   Ht 5\' 8"  (1.727 m)   Wt 142 lb (64.4 kg)   BMI 21.59 kg/m  Gen:  WD/WN, NAD Head: [place]/AT, No temporalis wasting. Ear/Nose/Throat: Hearing grossly intact, nares w/o erythema or drainage Eyes: Conjunctiva clear. Sclera non-icteric Neck: Supple.  Trachea midline Pulmonary:  Good air movement, no use of accessory muscles.  Cardiac: somewhat irregular Vascular:  Vessel Right Left  Radial Palpable Palpable                          PT Palpable Palpable  DP Palpable Palpable   Gastrointestinal: soft, non-tender/non-distended. No guarding/reflex.  Musculoskeletal: M/S 5/5 throughout.  No deformity or atrophy. No edema. Neurologic: Sensation grossly intact in extremities.  Symmetrical.  Speech is fluent.  Psychiatric: Judgment intact, Mood & affect appropriate for pt's clinical situation. Dermatologic: No rashes or ulcers noted.  No cellulitis or open wounds.       Labs Recent Results (from the past 2160 hour(s))  Folate     Status: None   Collection Time: 05/05/19  9:30 AM  Result Value Ref Range   Folate 11.3 >5.9 ng/mL    Comment: Performed at [facility], [address], [place], [place] [address]  Copper, serum     Status: None   Collection Time: 05/05/19  9:30 AM  Result Value Ref Range   Copper 100 69 - 132 ug/dL    Comment: (NOTE) This test was developed and its performance characteristics determined by [facility]. It has not been cleared or approved by the Food and Drug Administration.                                     Detection Limit = 5              **Please note reference interval change** Performed At: [facility] [place], [place] [id number] Rush Farmer MD [id number]   Basic metabolic panel     Status: Abnormal   Collection Time: 05/05/19  9:30 AM  Result Value Ref Range   Sodium 138 135 - 145 mmol/L   Potassium 4.5 3.5 - 5.1 mmol/L   Chloride 109 98 - 111 mmol/L   CO2 22 22 - 32 mmol/L   Glucose, Bld 137 (H) 70 - 99 mg/dL   BUN 23 8 - 23 mg/dL   Creatinine, Ser 1.42 (H) 0.61 - 1.24 mg/dL   Calcium 8.5 (L) 8.9 - 10.3 mg/dL   GFR calc non Af Amer 50 (L) >60 mL/min   GFR calc Af Amer 58 (L) >60 mL/min   Anion gap 7 5 - 15    Comment: Performed at [facility], [address] [place], [place] [address]  CBC with Differential     Status: Abnormal   Collection Time: 05/05/19  9:30 AM  Result Value Ref Range   WBC 19.0 (H) 4.0 - 10.5 K/uL   RBC 2.60 (L) 4.22 - 5.81 MIL/uL   Hemoglobin 8.7 (L) 13.0 - 17.0 g/dL   HCT 28.6 (L) 39.0 - 52.0 %   MCV 110.0 (H) 80.0 - 100.0 fL   MCH 33.5 26.0 - 34.0 pg   MCHC 30.4 30.0 - 36.0 g/dL   RDW 15.5 11.5 - 15.5 %   Platelets 111 (L) 150 - 400 K/uL   nRBC 0.0 0.0 - 0.2 %   Neutrophils Relative % 18 %   Neutro Abs 3.4 1.7 - 7.7 K/uL   Lymphocytes Relative 65 %   Lymphs Abs 12.2 (H) 0.7 - 4.0 K/uL   Monocytes Relative 15 %   Monocytes Absolute 2.9 (H) 0.1 - 1.0 K/uL   Eosinophils Relative 1 %   Eosinophils Absolute 0.2 0.0 - 0.5 K/uL   Basophils Relative 1 %   Basophils Absolute 0.2 (H) 0.0 - 0.1 K/uL   WBC Morphology CONSISTANT WITH KNOWN CLL    RBC Morphology MORPHOLOGY UNREMARKABLE    Smear Review Normal platelet morphology     Comment: PLATELETS APPEAR DECREASED Reviewed    Immature Granulocytes 0 %   Abs Immature Granulocytes 0.04 0.00 - 0.07 K/uL    Comment: Performed at [facility], [address] [place], [place] [address]  Lipase, blood     Status: None   Collection Time: 05/11/19 11:17 AM    Result Value Ref Range   Lipase 28 11 - 51 U/L    Comment: Performed at [facility], [address] [place], [place] [address]  Amylase     Status: None   Collection Time: 05/11/19 11:17 AM  Result Value Ref Range   Amylase 30 28 - 100 U/L  Comment: Performed at [facility], [address] [place], [place] [address]  Folate     Status: None   Collection Time: 05/11/19 11:17 AM  Result Value Ref Range   Folate 10.4 >5.9 ng/mL    Comment: Performed at [facility], [address] [place], [place] [address]  Hemoglobin A1c     Status: Abnormal   Collection Time: 05/11/19 11:17 AM  Result Value Ref Range   Hgb A1c MFr Bld 6.0 (H) 4.8 - 5.6 %    Comment: (NOTE) Pre diabetes:          5.7%-6.4% Diabetes:              >6.4% Glycemic control for   <7.0% adults with diabetes    Mean Plasma Glucose 125.5 mg/dL    Comment: Performed at [place] [address] [place], [place] [address]  Platelet count     Status: Abnormal   Collection Time: 05/11/19 11:17 AM  Result Value Ref Range   Platelets 136 (L) 150 - 400 K/uL    Comment: Performed at [facility], [address] [place], [place] [address]  Platelet function assay     Status: Abnormal   Collection Time: 05/11/19 11:17 AM  Result Value Ref Range   Collagen / ADP 99 0 - 118 seconds   PFA Interpretation            Comment: This pattern is indicative of drug induced platelet dysfunction. Most commonly seen after aspirin ingestion.        Results of the test should always be interpreted in conjunction with the patient's medical history, clinical presentation and medication history. Patients with Hematocrit values <35.0% or Platelet counts <150,000/uL may result in values above the Laboratory established reference range.    Collagen / Epinephrine 239 (H) 0 - 193 seconds    Comment: Performed at [facility], [address], [place], [place] [address]  Uric acid      Status: None   Collection Time: 05/11/19 11:17 AM  Result Value Ref Range   Uric Acid, Serum 7.1 3.7 - 8.6 mg/dL    Comment: Performed at [facility], [address] [place], [place] [address]  C-reactive protein     Status: None   Collection Time: 05/11/19 11:17 AM  Result Value Ref Range   CRP 0.6 <1.0 mg/dL    Comment: Performed at [facility], [place] [address], [place], [place] [address]  Sedimentation rate     Status: Abnormal   Collection Time: 05/11/19 11:17 AM  Result Value Ref Range   Sed Rate 39 (H) 0 - 20 mm/hr    Comment: Performed at [facility], [address] [place], [place] [address]  Vitamin B12     Status: None   Collection Time: 05/11/19 11:17 AM  Result Value Ref Range   Vitamin B-12 305 180 - 914 pg/mL    Comment: (NOTE) This assay is not validated for testing neonatal or myeloproliferative syndrome specimens for Vitamin B12 levels. Performed at [facility], [place] [address] [place], [place] [address]   Magnesium     Status: None   Collection Time: 05/11/19 11:17 AM  Result Value Ref Range   Magnesium 2.1 1.7 - 2.4 mg/dL    Comment: Performed at [facility], [address] [place],  [address]  Comprehensive metabolic panel     Status: Abnormal   Collection Time: 05/11/19 11:17  AM  Result Value Ref Range   Sodium 139 135 - 145 mmol/L   Potassium 5.2 (H) 3.5 - 5.1 mmol/L   Chloride 110 98 - 111 mmol/L   CO2 23 22 - 32 mmol/L   Glucose, Bld 117 (H) 70 - 99 mg/dL   BUN 24 (H) 8 - 23 mg/dL   Creatinine, Ser 1.34 (H) 0.61 - 1.24 mg/dL   Calcium 8.9 8.9 - 10.3 mg/dL   Total Protein 7.4 6.5 - 8.1 g/dL   Albumin 4.6 3.5 - 5.0 g/dL   AST 12 (L) 15 - 41 U/L   ALT 12 0 - 44 U/L   Alkaline Phosphatase 60 38 - 126 U/L   Total Bilirubin 0.5 0.3 - 1.2 mg/dL   GFR calc non Af Amer 53 (L) >60 mL/min   GFR calc Af Amer >60 >60 mL/min   Anion gap 6 5 - 15    Comment: Performed at [facility], [address], [place], [place] [address]  VITAMIN D 25 Hydroxy (Vit-D Deficiency, Fractures)     Status: Abnormal   Collection Time: 05/11/19 11:17 AM  Result Value Ref Range   Vit D, 25-Hydroxy 13.74 (L) 30 - 100 ng/mL    Comment: (NOTE) Vitamin D deficiency has been defined by the Institute of Medicine  and an Endocrine Society practice guideline as a level of serum 25-OH  vitamin D less than 20 ng/mL (1,2). The Endocrine Society went on to  further define vitamin D insufficiency as a level between 21 and 29  ng/mL (2). 1. IOM (Institute of Medicine). 2010. Dietary reference intakes for  calcium and D. [place]: The [employer]. 2. Holick MF, Binkley Bancroft, Bischoff-Ferrari HA, et al. Evaluation,  treatment, and prevention of vitamin D deficiency: an Endocrine  Society clinical practice guideline, JCEM. 2011 Jul; 96(7): 1911-30. Performed at [facility], [place] [address], [place], [place] [address]   Uric acid, random urine     Status: None   Collection Time: 05/11/19 11:18 AM  Result Value Ref Range   Uric Acid, Urine 26.2 Not Estab. mg/dL    Comment: (NOTE) Performed At: [facility] [place], [place] [id number] Rush Farmer MD [id number]   Urine Drug Screen, Qualitative (Page only)     Status: Abnormal   Collection Time: 05/11/19 11:18 AM  Result Value Ref Range   Tricyclic, Ur Screen NONE DETECTED NONE DETECTED   Amphetamines, Ur Screen NONE DETECTED NONE DETECTED   MDMA (Ecstasy)Ur Screen NONE DETECTED NONE DETECTED   Cocaine Metabolite,Ur [place] NONE DETECTED NONE DETECTED   Opiate, Ur Screen POSITIVE (A) NONE DETECTED   Phencyclidine (PCP) Ur S NONE DETECTED NONE DETECTED   Cannabinoid 50 Ng, Ur [place] NONE DETECTED NONE DETECTED   Barbiturates, Ur Screen NONE DETECTED NONE DETECTED   Benzodiazepine, Ur Scrn NONE DETECTED NONE DETECTED   Methadone Scn, Ur NONE DETECTED NONE DETECTED    Comment: (NOTE) Tricyclics + metabolites,  urine    Cutoff 1000 ng/mL Amphetamines + metabolites, urine  Cutoff 1000 ng/mL MDMA (Ecstasy), urine              Cutoff 500 ng/mL Cocaine Metabolite, urine          Cutoff 300 ng/mL Opiate + metabolites, urine        Cutoff 300 ng/mL Phencyclidine (PCP), urine         Cutoff 25 ng/mL Cannabinoid, urine  Cutoff 50 ng/mL Barbiturates + metabolites, urine  Cutoff 200 ng/mL Benzodiazepine, urine              Cutoff 200 ng/mL Methadone, urine                   Cutoff 300 ng/mL The urine drug screen provides only a preliminary, unconfirmed analytical test result and should not be used for non-medical purposes. Clinical consideration and professional judgment should be applied to any positive drug screen result due to possible interfering substances. A more specific alternate chemical method must be used in order to obtain a confirmed analytical result. Gas chromatography / mass spectrometry (GC/MS) is the preferred confirmat ory method. Performed at [facility], [address] [place], [place] [address]   Basic metabolic panel     Status: Abnormal   Collection Time: 05/19/19 10:23 AM  Result Value Ref Range   Sodium 140 135 - 145 mmol/L   Potassium 5.9 (H) 3.5 - 5.1 mmol/L   Chloride 111 98 - 111 mmol/L   CO2 23 22 - 32 mmol/L   Glucose, Bld 111 (H) 70 - 99 mg/dL   BUN 35 (H) 8 - 23 mg/dL   Creatinine, Ser 1.80 (H) 0.61 - 1.24 mg/dL   Calcium 8.9 8.9 - 10.3 mg/dL   GFR calc non Af Amer 37 (L) >60 mL/min   GFR calc Af Amer 43 (L) >60 mL/min   Anion gap 6 5 - 15    Comment: Performed at [facility], [address] [place], [place] [address]  CBC with Differential     Status: Abnormal   Collection Time: 05/19/19 10:23 AM  Result Value Ref Range   WBC 19.4 (H) 4.0 - 10.5 K/uL   RBC 2.64 (L) 4.22 - 5.81 MIL/uL   Hemoglobin 8.5 (L) 13.0 - 17.0 g/dL   HCT 28.9 (L) 39.0 - 52.0 %   MCV 109.5 (H) 80.0 - 100.0 fL   MCH 32.2 26.0 - 34.0 pg   MCHC 29.4  (L) 30.0 - 36.0 g/dL   RDW 15.9 (H) 11.5 - 15.5 %   Platelets 112 (L) 150 - 400 K/uL   nRBC 0.0 0.0 - 0.2 %   Neutrophils Relative % 19 %   Neutro Abs 3.7 1.7 - 7.7 K/uL   Lymphocytes Relative 62 %   Lymphs Abs 12.1 (H) 0.7 - 4.0 K/uL   Monocytes Relative 17 %   Monocytes Absolute 3.2 (H) 0.1 - 1.0 K/uL   Eosinophils Relative 1 %   Eosinophils Absolute 0.2 0.0 - 0.5 K/uL   Basophils Relative 1 %   Basophils Absolute 0.2 (H) 0.0 - 0.1 K/uL   WBC Morphology DIFF CONFIRMED BY MANUAL    Smear Review MORPHOLOGY UNREMARKABLE     Comment: Reviewed   Immature Granulocytes 0 %   Abs Immature Granulocytes 0.03 0.00 - 0.07 K/uL    Comment: Performed at [facility], [address] [place], [place] [address]  Basic metabolic panel     Status: Abnormal   Collection Time: 06/02/19 10:50 AM  Result Value Ref Range   Sodium 137 135 - 145 mmol/L   Potassium 6.5 (HH) 3.5 - 5.1 mmol/L    Comment: REPEATED TO VERIFY CRITICAL RESULT CALLED TO, READ BACK BY AND VERIFIED WITH: DR [facility] @ 11:16 06/02/2019 KMR    Chloride 110 98 - 111 mmol/L   CO2 20 (L) 22 - 32 mmol/L   Glucose, Bld 166 (H) 70 -  99 mg/dL   BUN 54 (H) 8 - 23 mg/dL   Creatinine, Ser 1.80 (H) 0.61 - 1.24 mg/dL   Calcium 9.6 8.9 - 10.3 mg/dL   GFR calc non Af Amer 37 (L) >60 mL/min   GFR calc Af Amer 43 (L) >60 mL/min   Anion gap 7 5 - 15    Comment: Performed at [facility], [address] [place], [place] [address]  CBC with Differential     Status: Abnormal   Collection Time: 06/02/19 10:50 AM  Result Value Ref Range   WBC 35.9 (H) 4.0 - 10.5 K/uL   RBC 2.75 (L) 4.22 - 5.81 MIL/uL   Hemoglobin 8.8 (L) 13.0 - 17.0 g/dL   HCT 29.4 (L) 39.0 - 52.0 %   MCV 106.9 (H) 80.0 - 100.0 fL   MCH 32.0 26.0 - 34.0 pg   MCHC 29.9 (L) 30.0 - 36.0 g/dL   RDW 15.7 (H) 11.5 - 15.5 %   Platelets 149 (L) 150 - 400 K/uL   nRBC 0.1 0.0 - 0.2 %   Neutrophils Relative % 29 %   Neutro Abs 10.2 (H) 1.7 - 7.7 K/uL    Lymphocytes Relative 62 %   Lymphs Abs 22.4 (H) 0.7 - 4.0 K/uL   Monocytes Relative 8 %   Monocytes Absolute 3.0 (H) 0.1 - 1.0 K/uL   Eosinophils Relative 0 %   Eosinophils Absolute 0.0 0.0 - 0.5 K/uL   Basophils Relative 0 %   Basophils Absolute 0.1 0.0 - 0.1 K/uL   Smear Review MORPHOLOGY UNREMARKABLE     Comment: Normal platelet morphology Reviewed    Immature Granulocytes 1 %   Abs Immature Granulocytes 0.17 (H) 0.00 - 0.07 K/uL    Comment: Performed at [facility], [address] [place], [place] [address]  Magnesium     Status: None   Collection Time: 06/03/19  8:54 AM  Result Value Ref Range   Magnesium 2.4 1.7 - 2.4 mg/dL    Comment: Performed at [facility], [address] [place], [place] [address]  Phosphorus     Status: None   Collection Time: 06/03/19  8:54 AM  Result Value Ref Range   Phosphorus 4.4 2.5 - 4.6 mg/dL    Comment: Performed at [facility], [address], [place], [place] [address]  Uric acid     Status: None   Collection Time: 06/03/19  8:54 AM  Result Value Ref Range   Uric Acid, Serum 6.8 3.7 - 8.6 mg/dL    Comment: Performed at [facility], [address], [place], [place] [address]  Lactate dehydrogenase     Status: None   Collection Time: 06/03/19  8:54 AM  Result Value Ref Range   LDH 140 98 - 192 U/L    Comment: Performed at [facility], [address] [place], [place] [address]  Basic metabolic panel     Status: Abnormal   Collection Time: 06/03/19  8:54 AM  Result Value Ref Range   Sodium 139 135 - 145 mmol/L   Potassium 4.4 3.5 - 5.1 mmol/L   Chloride 106 98 - 111 mmol/L   CO2 25 22 - 32 mmol/L   Glucose, Bld 116 (H) 70 - 99 mg/dL   BUN 40 (H) 8 - 23 mg/dL   Creatinine, Ser 1.45 (H) 0.61 - 1.24 mg/dL   Calcium 9.1 8.9 - 10.3 mg/dL   GFR calc non Af Wyvonnia Lora  48 (L) >60 mL/min   GFR calc Af Amer 56 (L) >60 mL/min   Anion gap 8 5 - 15    Comment: Performed at [facility], [address] [place], [place] [address]  CBC with Differential     Status: Abnormal   Collection Time: 06/03/19  8:54 AM  Result Value Ref Range   WBC 31.8 (H) 4.0 - 10.5 K/uL   RBC 2.64 (L) 4.22 - 5.81 MIL/uL   Hemoglobin 8.7 (L) 13.0 - 17.0 g/dL   HCT 27.8 (L) 39.0 - 52.0 %   MCV 105.3 (H) 80.0 - 100.0 fL   MCH 33.0 26.0 - 34.0 pg   MCHC 31.3 30.0 - 36.0 g/dL   RDW 16.2 (H) 11.5 - 15.5 %   Platelets 132 (L) 150 - 400 K/uL   nRBC 0.0 0.0 - 0.2 %   Neutrophils Relative % 24 %   Neutro Abs 7.4 1.7 - 7.7 K/uL   Lymphocytes Relative 69 %   Lymphs Abs 21.4 (H) 0.7 - 4.0 K/uL   Monocytes Relative 6 %   Monocytes Absolute 2.0 (H) 0.1 - 1.0 K/uL   Eosinophils Relative 0 %   Eosinophils Absolute 0.0 0.0 - 0.5 K/uL   Basophils Relative 0 %   Basophils Absolute 0.1 0.0 - 0.1 K/uL   Immature Granulocytes 1 %   Abs Immature Granulocytes 0.15 (H) 0.00 - 0.07 K/uL    Comment: Performed at [facility], [address], [place], [place] [address]  Hepatitis C antibody     Status: None   Collection Time: 06/03/19  8:54 AM  Result Value Ref Range   HCV Ab NON REACTIVE NON REACTIVE    Comment: (NOTE) Nonreactive HCV antibody screen is consistent with no HCV infections,  unless recent infection is suspected or other evidence exists to indicate HCV infection. Performed at [facility], [place] [address], [place], [place] [address]   Hepatitis B surface antigen     Status: None   Collection Time: 06/03/19  8:54 AM  Result Value Ref Range   Hepatitis B Surface Ag NON REACTIVE NON REACTIVE    Comment: Performed at [place] [address], [place], [place] [address]  Hepatitis B core antibody, IgM     Status: None   Collection Time: 06/03/19  8:54 AM  Result Value Ref Range   Hep B C IgM NON REACTIVE NON REACTIVE    Comment: Performed at [place] [address], [place], [place] [address]  Basic metabolic panel     Status: Abnormal     Collection Time: 06/07/19  8:40 AM  Result Value Ref Range   Sodium 138 135 - 145 mmol/L   Potassium 5.1 3.5 - 5.1 mmol/L   Chloride 105 98 - 111 mmol/L   CO2 22 22 - 32 mmol/L   Glucose, Bld 157 (H) 70 - 99 mg/dL   BUN 30 (H) 8 - 23 mg/dL   Creatinine, Ser 1.64 (H) 0.61 - 1.24 mg/dL   Calcium 9.2 8.9 - 10.3 mg/dL   GFR calc non Af Amer 42 (L) >60 mL/min   GFR calc Af Amer 48 (L) >60 mL/min   Anion gap 11 5 - 15    Comment: Performed at [facility], [address], [place],  [address]  CBC with Differential     Status: Abnormal   Collection Time: 06/07/19  8:40 AM  Result Value Ref  Range   WBC 31.7 (H) 4.0 - 10.5 K/uL   RBC 2.78 (L) 4.22 - 5.81 MIL/uL   Hemoglobin 8.9 (L) 13.0 - 17.0 g/dL   HCT 30.1 (L) 39.0 - 52.0 %   MCV 108.3 (H) 80.0 - 100.0 fL   MCH 32.0 26.0 - 34.0 pg   MCHC 29.6 (L) 30.0 - 36.0 g/dL   RDW 15.9 (H) 11.5 - 15.5 %   Platelets 162 150 - 400 K/uL   nRBC 0.1 0.0 - 0.2 %   Neutrophils Relative % 11 %   Neutro Abs 3.8 1.7 - 7.7 K/uL   Band Neutrophils 1 %   Lymphocytes Relative 80 %   Lymphs Abs 25.4 (H) 0.7 - 4.0 K/uL   Monocytes Relative 4 %   Monocytes Absolute 1.3 (H) 0.1 - 1.0 K/uL   Eosinophils Relative 3 %   Eosinophils Absolute 1.0 (H) 0.0 - 0.5 K/uL   Basophils Relative 1 %   Basophils Absolute 0.3 (H) 0.0 - 0.1 K/uL   WBC Morphology Abnormal lymphocytes present     Comment: SMUDGE CELLS CONSISTANT WITH KNOWN CLL. DIFF CONFIRMED BY MANUAL    RBC Morphology UNREMARKABLE     Smear Review Normal platelet morphology     Comment: PLATELETS APPEAR ADEQUATE   Abs Immature Granulocytes 0.00 0.00 - 0.07 K/uL    Comment: Performed at [facility], [address] [place], [place] [address]  Hold Tube- Blood Bank     Status: None   Collection Time: 06/21/19  8:02 AM  Result Value Ref Range   Blood Bank Specimen SAMPLE AVAILABLE FOR TESTING    Sample Expiration      06/24/2019,2359 Performed at [facility], [address] [place], [place] [address]   Basic metabolic panel     Status: Abnormal   Collection Time: 06/21/19  8:02 AM  Result Value Ref Range   Sodium 139 135 - 145 mmol/L   Potassium 5.3 (H) 3.5 - 5.1 mmol/L   Chloride 108 98 - 111 mmol/L   CO2 22 22 - 32 mmol/L   Glucose, Bld 156 (H) 70 - 99 mg/dL   BUN 36 (H) 8 - 23 mg/dL   Creatinine, Ser 1.64 (H) 0.61 - 1.24 mg/dL   Calcium 9.5 8.9 - 10.3 mg/dL   GFR calc non Af Amer 42 (L) >60 mL/min   GFR calc Af Amer 48 (L) >60 mL/min   Anion gap 9 5 - 15    Comment: Performed at [facility], [address] [place],  [address]  CBC with Differential     Status: Abnormal   Collection Time: 06/21/19  8:02 AM  Result Value Ref Range   WBC 34.5 (H) 4.0 - 10.5 K/uL   RBC 2.74 (L) 4.22 - 5.81 MIL/uL   Hemoglobin 9.0 (L) 13.0 - 17.0 g/dL   HCT 29.9 (L) 39.0 - 52.0 %   MCV 109.1 (H) 80.0 - 100.0 fL   MCH 32.8 26.0 - 34.0 pg   MCHC 30.1 30.0 - 36.0 g/dL   RDW 17.4 (H) 11.5 - 15.5 %   Platelets 163 150 - 400 K/uL   nRBC 0.0 0.0 - 0.2 %   Neutrophils Relative % 13 %   Neutro Abs 4.7 1.7 - 7.7 K/uL   Lymphocytes Relative 76 %   Lymphs Abs 26.1 (H) 0.7 - 4.0 K/uL   Monocytes Relative 9 %   Monocytes Absolute 3.2 (H) 0.1 - 1.0 K/uL  Eosinophils Relative 1 %   Eosinophils Absolute 0.2 0.0 - 0.5 K/uL   Basophils Relative 1 %   Basophils Absolute 0.3 (H) 0.0 - 0.1 K/uL   WBC Morphology Abnormal lymphocytes present     Comment: DIFF CONFIRMED BY MANUAL. CONSISTANT WITH KNOWN CLL   RBC Morphology MIXED RBC POPULATION    Smear Review PLATELETS APPEAR ADEQUATE     Comment: PLATELETS VARY IN SIZE WITH LARGE BODY PLATELETS NOTED   Immature Granulocytes 0 %   Abs Immature Granulocytes 0.09 (H) 0.00 - 0.07 K/uL    Comment: Performed at [facility], [address] [place], [place] [address]  Lactate dehydrogenase     Status: None   Collection Time: 06/29/19  8:04 AM  Result Value Ref Range   LDH 118 98 - 192 U/L     Comment: Performed at [facility], [address] [place], [place] [address]  Comprehensive metabolic panel     Status: Abnormal   Collection Time: 06/29/19  8:04 AM  Result Value Ref Range   Sodium 137 135 - 145 mmol/L   Potassium 5.9 (H) 3.5 - 5.1 mmol/L   Chloride 106 98 - 111 mmol/L   CO2 21 (L) 22 - 32 mmol/L   Glucose, Bld 181 (H) 70 - 99 mg/dL    Comment: Glucose reference range applies only to samples taken after fasting for at least 8 hours.   BUN 43 (H) 8 - 23 mg/dL   Creatinine, Ser 1.42 (H) 0.61 - 1.24 mg/dL   Calcium 9.5 8.9 - 10.3 mg/dL   Total Protein 7.3 6.5 - 8.1 g/dL   Albumin 4.7 3.5 - 5.0 g/dL   AST 11 (L) 15 - 41 U/L   ALT 10 0 - 44 U/L   Alkaline Phosphatase 38 38 - 126 U/L   Total Bilirubin 0.4 0.3 - 1.2 mg/dL   GFR calc non Af Amer 50 (L) >60 mL/min   GFR calc Af Amer 58 (L) >60 mL/min   Anion gap 10 5 - 15    Comment: Performed at [facility], [address] [place], [place] [address]  CBC with Differential     Status: Abnormal   Collection Time: 06/29/19  8:04 AM  Result Value Ref Range   WBC 34.3 (H) 4.0 - 10.5 K/uL   RBC 2.61 (L) 4.22 - 5.81 MIL/uL   Hemoglobin 8.7 (L) 13.0 - 17.0 g/dL   HCT 28.8 (L) 39.0 - 52.0 %   MCV 110.3 (H) 80.0 - 100.0 fL   MCH 33.3 26.0 - 34.0 pg   MCHC 30.2 30.0 - 36.0 g/dL   RDW 17.3 (H) 11.5 - 15.5 %   Platelets 185 150 - 400 K/uL   nRBC 0.0 0.0 - 0.2 %   Neutrophils Relative % 22 %   Neutro Abs 7.6 1.7 - 7.7 K/uL   Lymphocytes Relative 73 %   Lymphs Abs 24.6 (H) 0.7 - 4.0 K/uL   Monocytes Relative 5 %   Monocytes Absolute 1.9 (H) 0.1 - 1.0 K/uL   Eosinophils Relative 0 %   Eosinophils Absolute 0.0 0.0 - 0.5 K/uL   Basophils Relative 0 %   Basophils Absolute 0.1 0.0 - 0.1 K/uL   WBC Morphology VARIANT LYMPH POPULATION NOTED ON SMEAR (LARGE)    Smear Review MORPHOLOGY UNREMARKABLE     Comment: PLATELETS APPEAR ADEQUATE Reviewed    Immature Granulocytes 0 %   Abs Immature Granulocytes 0.12 (H)  0.00 - 0.07  K/uL    Comment: Performed at [facility], [address] [place], [place] [address]  Lactate dehydrogenase     Status: None   Collection Time: 07/08/19  8:24 AM  Result Value Ref Range   LDH 123 98 - 192 U/L    Comment: Performed at [facility], [address] [place], [place] [address]  Comprehensive metabolic panel     Status: Abnormal   Collection Time: 07/08/19  8:24 AM  Result Value Ref Range   Sodium 137 135 - 145 mmol/L   Potassium 4.9 3.5 - 5.1 mmol/L   Chloride 103 98 - 111 mmol/L   CO2 24 22 - 32 mmol/L   Glucose, Bld 159 (H) 70 - 99 mg/dL    Comment: Glucose reference range applies only to samples taken after fasting for at least 8 hours.   BUN 32 (H) 8 - 23 mg/dL   Creatinine, Ser 1.21 0.61 - 1.24 mg/dL   Calcium 9.4 8.9 - 10.3 mg/dL   Total Protein 7.2 6.5 - 8.1 g/dL   Albumin 4.6 3.5 - 5.0 g/dL   AST 12 (L) 15 - 41 U/L   ALT 13 0 - 44 U/L   Alkaline Phosphatase 40 38 - 126 U/L   Total Bilirubin 0.5 0.3 - 1.2 mg/dL   GFR calc non Af Amer >60 >60 mL/min   GFR calc Af Amer >60 >60 mL/min   Anion gap 10 5 - 15    Comment: Performed at [facility], [address] [place], [place] [address]  CBC with Differential     Status: Abnormal   Collection Time: 07/08/19  8:24 AM  Result Value Ref Range   WBC 28.2 (H) 4.0 - 10.5 K/uL   RBC 2.50 (L) 4.22 - 5.81 MIL/uL   Hemoglobin 8.4 (L) 13.0 - 17.0 g/dL   HCT 27.7 (L) 39.0 - 52.0 %   MCV 110.8 (H) 80.0 - 100.0 fL   MCH 33.6 26.0 - 34.0 pg   MCHC 30.3 30.0 - 36.0 g/dL   RDW 17.2 (H) 11.5 - 15.5 %   Platelets 142 (L) 150 - 400 K/uL   nRBC 0.0 0.0 - 0.2 %   Neutrophils Relative % 20 %   Neutro Abs 5.6 1.7 - 7.7 K/uL   Lymphocytes Relative 73 %   Lymphs Abs 20.6 (H) 0.7 - 4.0 K/uL   Monocytes Relative 7 %   Monocytes Absolute 1.9 (H) 0.1 - 1.0 K/uL   Eosinophils Relative 0 %   Eosinophils Absolute 0.0 0.0 - 0.5 K/uL   Basophils Relative 0 %   Basophils Absolute 0.1 0.0 - 0.1 K/uL     WBC Morphology Abnormal lymphocytes present     Comment: DIFF CONFIRMED BY MANUAL. CONSISTANT WITH KNOWN CLL   RBC Morphology MIXED RBC POPULATION    Smear Review PLATELETS APPEAR ADEQUATE     Comment: PLATELETS VARY IN SIZE   Immature Granulocytes 0 %   Abs Immature Granulocytes 0.11 (H) 0.00 - 0.07 K/uL    Comment: Performed at [facility], [address], [place], [place] [address]  Basic metabolic panel     Status: Abnormal   Collection Time: 07/16/19  8:25 AM  Result Value Ref Range   Sodium 135 135 - 145 mmol/L   Potassium 5.1 3.5 - 5.1 mmol/L   Chloride 103 98 - 111 mmol/L   CO2 21 (L) 22 - 32 mmol/L   Glucose, Bld 200 (H) 70 - 99  mg/dL    Comment: Glucose reference range applies only to samples taken after fasting for at least 8 hours.   BUN 39 (H) 8 - 23 mg/dL   Creatinine, Ser 1.42 (H) 0.61 - 1.24 mg/dL   Calcium 9.0 8.9 - 10.3 mg/dL   GFR calc non Af Amer 50 (L) >60 mL/min   GFR calc Af Amer 58 (L) >60 mL/min   Anion gap 11 5 - 15    Comment: Performed at [facility], [address] [place], [place] [address]  CBC with Differential     Status: Abnormal   Collection Time: 07/16/19  8:25 AM  Result Value Ref Range   WBC 6.5 4.0 - 10.5 K/uL   RBC 2.60 (L) 4.22 - 5.81 MIL/uL   Hemoglobin 8.9 (L) 13.0 - 17.0 g/dL   HCT 28.3 (L) 39.0 - 52.0 %   MCV 108.8 (H) 80.0 - 100.0 fL   MCH 34.2 (H) 26.0 - 34.0 pg   MCHC 31.4 30.0 - 36.0 g/dL   RDW 16.6 (H) 11.5 - 15.5 %   Platelets 126 (L) 150 - 400 K/uL   nRBC 0.0 0.0 - 0.2 %   Neutrophils Relative % 51 %   Neutro Abs 3.4 1.7 - 7.7 K/uL   Lymphocytes Relative 44 %   Lymphs Abs 2.9 0.7 - 4.0 K/uL   Monocytes Relative 4 %   Monocytes Absolute 0.3 0.1 - 1.0 K/uL   Eosinophils Relative 0 %   Eosinophils Absolute 0.0 0.0 - 0.5 K/uL   Basophils Relative 0 %   Basophils Absolute 0.0 0.0 - 0.1 K/uL   Immature Granulocytes 1 %   Abs Immature Granulocytes 0.04 0.00 - 0.07 K/uL    Comment: Performed at [facility], [address] [place], [place] [address]  Hepatic function panel     Status: Abnormal   Collection Time: 07/16/19  9:36 AM  Result Value Ref Range   Total Protein 7.2 6.5 - 8.1 g/dL   Albumin 4.6 3.5 - 5.0 g/dL   AST 12 (L) 15 - 41 U/L   ALT 31 0 - 44 U/L   Alkaline Phosphatase 48 38 - 126 U/L   Total Bilirubin 0.7 0.3 - 1.2 mg/dL   Bilirubin, Direct 0.2 0.0 - 0.2 mg/dL   Indirect Bilirubin 0.5 0.3 - 0.9 mg/dL    Comment: Performed at [facility], [address] [place], [place] [address]  Lactic Acid, Plasma     Status: None   Collection Time: 07/16/19  9:36 AM  Result Value Ref Range   Lactic Acid, Venous 1.7 0.5 - 1.9 mmol/L    Comment: Performed at [facility], [address] [place], [place] [address]  Amylase     Status: Abnormal   Collection Time: 07/16/19  9:36 AM  Result Value Ref Range   Amylase 22 (L) 28 - 100 U/L    Comment: Performed at [facility], [address] [place], [place] [address]  Lipase, blood     Status: None   Collection Time: 07/16/19  9:36 AM  Result Value Ref Range   Lipase 25 11 - 51 U/L    Comment: Performed at [facility], [address] [place], [place] [address]  Glucose, capillary     Status: Abnormal   Collection Time: 07/22/19 10:06 AM  Result Value Ref Range   Glucose-Capillary 128 (H) 70 - 99 mg/dL    Comment: Glucose reference range applies only  to samples taken after fasting for at least 8 hours.  Basic metabolic panel     Status: Abnormal   Collection Time: 07/22/19 11:46 AM  Result Value Ref Range   Sodium 141 135 - 145 mmol/L   Potassium 6.2 (H) 3.5 - 5.1 mmol/L   Chloride 110 98 - 111 mmol/L   CO2 23 22 - 32 mmol/L   Glucose, Bld 125 (H) 70 - 99 mg/dL    Comment: Glucose reference range applies only to samples taken after fasting for at least 8 hours.   BUN 26 (H) 8 - 23 mg/dL   Creatinine, Ser 1.30 (H) 0.61 - 1.24 mg/dL   Calcium 9.0 8.9 - 10.3 mg/dL   GFR calc  non Af Amer 55 (L) >60 mL/min   GFR calc Af Amer >60 >60 mL/min   Anion gap 8 5 - 15    Comment: Performed at [facility], [address] [place], [place] [address]  CBC with Differential     Status: Abnormal   Collection Time: 07/22/19 11:46 AM  Result Value Ref Range   WBC 6.2 4.0 - 10.5 K/uL   RBC 2.38 (L) 4.22 - 5.81 MIL/uL   Hemoglobin 8.2 (L) 13.0 - 17.0 g/dL   HCT 26.8 (L) 39.0 - 52.0 %   MCV 112.6 (H) 80.0 - 100.0 fL   MCH 34.5 (H) 26.0 - 34.0 pg   MCHC 30.6 30.0 - 36.0 g/dL   RDW 16.9 (H) 11.5 - 15.5 %   Platelets 137 (L) 150 - 400 K/uL   nRBC 0.0 0.0 - 0.2 %   Neutrophils Relative % 51 %   Neutro Abs 3.1 1.7 - 7.7 K/uL   Lymphocytes Relative 39 %   Lymphs Abs 2.4 0.7 - 4.0 K/uL   Monocytes Relative 7 %   Monocytes Absolute 0.4 0.1 - 1.0 K/uL   Eosinophils Relative 2 %   Eosinophils Absolute 0.1 0.0 - 0.5 K/uL   Basophils Relative 1 %   Basophils Absolute 0.1 0.0 - 0.1 K/uL   Immature Granulocytes 0 %   Abs Immature Granulocytes 0.02 0.00 - 0.07 K/uL    Comment: Performed at [facility], [address] [place], [place] [address]  Basic metabolic panel     Status: Abnormal   Collection Time: 07/26/19  8:16 AM  Result Value Ref Range   Sodium 139 135 - 145 mmol/L   Potassium 5.5 (H) 3.5 - 5.1 mmol/L   Chloride 108 98 - 111 mmol/L   CO2 24 22 - 32 mmol/L   Glucose, Bld 134 (H) 70 - 99 mg/dL    Comment: Glucose reference range applies only to samples taken after fasting for at least 8 hours.   BUN 32 (H) 8 - 23 mg/dL   Creatinine, Ser 1.31 (H) 0.61 - 1.24 mg/dL   Calcium 9.1 8.9 - 10.3 mg/dL   GFR calc non Af Amer 55 (L) >60 mL/min   GFR calc Af Amer >60 >60 mL/min   Anion gap 7 5 - 15    Comment: Performed at [facility], [address] [place],  [address]  CBC with Differential     Status: Abnormal   Collection Time: 07/26/19  8:16 AM  Result Value Ref Range   WBC 7.7 4.0 - 10.5 K/uL   RBC 2.29 (L) 4.22 - 5.81 MIL/uL    Hemoglobin 8.0 (L) 13.0 - 17.0 g/dL   HCT 26.2 (L) 39.0 - 52.0 %  MCV 114.4 (H) 80.0 - 100.0 fL   MCH 34.9 (H) 26.0 - 34.0 pg   MCHC 30.5 30.0 - 36.0 g/dL   RDW 16.4 (H) 11.5 - 15.5 %   Platelets 135 (L) 150 - 400 K/uL   nRBC 0.0 0.0 - 0.2 %   Neutrophils Relative % 53 %   Neutro Abs 4.2 1.7 - 7.7 K/uL   Lymphocytes Relative 35 %   Lymphs Abs 2.7 0.7 - 4.0 K/uL   Monocytes Relative 8 %   Monocytes Absolute 0.6 0.1 - 1.0 K/uL   Eosinophils Relative 2 %   Eosinophils Absolute 0.1 0.0 - 0.5 K/uL   Basophils Relative 1 %   Basophils Absolute 0.0 0.0 - 0.1 K/uL   Immature Granulocytes 1 %   Abs Immature Granulocytes 0.05 0.00 - 0.07 K/uL    Comment: Performed at [facility], [address] [place], [place] [address]    Radiology US Abdomen Complete  Result Date: 07/16/2019 CLINICAL DATA:  Worsening epigastric pain EXAM: ABDOMEN ULTRASOUND COMPLETE COMPARISON:  CT abdomen from 06/18/2019 FINDINGS: Gallbladder: Surgically absent Common bile duct: Diameter: 0.8 cm (formerly 0.7 cm on 06/18/2019 CT scan). Liver: No focal lesion identified. Within normal limits in parenchymal echogenicity. Portal vein is patent on color Doppler imaging with normal direction of blood flow towards the liver. IVC: No abnormality visualized. Pancreas: Visualized portion unremarkable. Portions poorly seen due to overlying bowel gas. Spleen: Size and appearance within normal limits. The spleen measures 9.3 cm craniocaudad. Right Kidney: Length: 10.3 cm. Echogenicity within normal limits. No solid mass observed. There is a right peripelvic cyst as shown on CT from 01/20/2018. In addition there appears to be mild right hydronephrosis. A right renal cyst measures 2.4 by 1.5 by 2.6 cm. Another lower pole cyst measures 1.3 by 1.2 by 1.4 cm. Left Kidney: Length: 10.2 cm. Echogenicity within normal limits. No solid mass or hydronephrosis visualized. A left mid kidney cyst measures 4.3 by 4.4 by 5.2 cm faint has mild  complexity with suspected septation along its margin, but no appreciable nodularity. Abdominal aorta: No aneurysm visualized.  Atherosclerosis noted. Other findings: None. IMPRESSION: 1. Mild right hydronephrosis in addition to a right peripelvic cyst. The patient has known tiny bilateral renal calculi which are not readily seen on today's examination. 2. Bilateral chronic renal cysts. Electronically Signed   By: Van Clines M.D.   On: 07/16/2019 11:03   NM PET Image Restag (PS) Skull Base To Thigh  Result Date: 07/22/2019 CLINICAL DATA:  Subsequent treatment strategy for chronic lymphocytic leukemia. EXAM: NUCLEAR MEDICINE PET SKULL BASE TO THIGH TECHNIQUE: 7.65 mCi F-18 FDG was injected intravenously. Full-ring PET imaging was performed from the skull base to thigh after the radiotracer. CT data was obtained and used for attenuation correction and anatomic localization. Fasting blood glucose: 128 mg/dl COMPARISON:  CT, chest, abdomen and pelvis.  06/18/2019 FINDINGS: Mediastinal blood pool activity: SUV max 2.47 Liver activity: SUV max 3.22 NECK: No FDG avid lymph nodes. Incidental CT findings: none CHEST: No FDG avid mediastinal, hilar, axillary or supraclavicular lymph nodes. The index left axillary lymph node measures 1.2 cm and has an SUV max of 1.15, 76/3. Previously 1.8 cm. The index right paratracheal measures 1.1 cm with SUV max of 1.39. Previously 1.1 cm The index AP window lymph node measures 0.7 cm and has an SUV max of 1.35. previously 1 cm Incidental CT findings: 3 vessel coronary artery atherosclerotic calcifications. ABDOMEN/PELVIS: No abnormal tracer uptake identified within  the liver, pancreas, spleen or adrenal glands. The index left retroperitoneal nodal mass measures 2.9 x 2.5 cm and has an SUV max of 1.8. Previously 3.4 x 2.3 cm The index central mesenteric lymph node measures 1 cm short axis and has an SUV max of 2.78. Previously this measured 1.1 cm. The index left external iliac  node measures 0.6 cm and has an SUV max of 1.49. Previously 1.1 cm Spleen measures 6.0 x 11.0 by 11.0 cm. The SUV max is equal to 2.39. Incidental CT findings: Extensive aortic atherosclerosis. Infrarenal abdominal scratch no aneurysm. Prostate gland appears enlarged. Bilateral kidney cysts. Cholecystectomy. SKELETON: No focal hypermetabolic activity to suggest skeletal metastasis. Incidental CT findings: none IMPRESSION: 1. There is low level FDG uptake associated with recently described adenopathy within the neck, chest, abdomen and pelvis. There is Deauville criteria 2 in 3 FDG uptake associated with these lymph nodes. 2. Normal size spleen without significant FDG uptake. 3.  Aortic Atherosclerosis (ICD10-I70.0). Electronically Signed   By: Kerby Moors M.D.   On: 07/22/2019 15:25    Assessment/Plan Essential hypertension blood pressure control important in reducing the progression of atherosclerotic disease. On appropriate oral medications.   Type 2 diabetes mellitus with complication (HCC) blood glucose control important in reducing the progression of atherosclerotic disease. Also, involved in wound healing. On appropriate medications.  Hyperlipidemia lipid control important in reducing the progression of atherosclerotic disease. Continue statin therapy  Malignant lymphoma, small lymphocytic ([place]) Follows with oncology.  Atherosclerotic peripheral vascular disease with intermittent claudication (HCC) His ABIs today are in the normal range at 1.01 on the right and 0.97 on the left with strong triphasic waveforms and normal digital pressures.  Leg symptoms are much better after intervention.  Plan to see him back in 6 months with noninvasive studies    Leotis Pain, MD  07/27/2019 3:36 PM    This note was created with Dragon medical transcription system.  Any errors from dictation are purely unintentional

## 2019-07-27 NOTE — Patient Instructions (Signed)
Peripheral Vascular Disease  Peripheral vascular disease (PVD) is a disease of the blood vessels that are not part of your heart and brain. A simple term for PVD is poor circulation. In most cases, PVD narrows the blood vessels that carry blood from your heart to the rest of your body. This can reduce the supply of blood to your arms, legs, and internal organs, like your stomach or kidneys. However, PVD most often affects a person's lower legs and feet. Without treatment, PVD tends to get worse. PVD can also lead to acute ischemic limb. This is when an arm or leg suddenly cannot get enough blood. This is a medical emergency. Follow these instructions at home: Lifestyle  Do not use any products that contain nicotine or tobacco, such as cigarettes and e-cigarettes. If you need help quitting, ask your doctor.  Lose weight if you are overweight. Or, stay at a healthy weight as told by your doctor.  Eat a diet that is low in fat and cholesterol. If you need help, ask your doctor.  Exercise regularly. Ask your doctor for activities that are right for you. General instructions  Take over-the-counter and prescription medicines only as told by your doctor.  Take good care of your feet: ? Wear comfortable shoes that fit well. ? Check your feet often for any cuts or sores.  Keep all follow-up visits as told by your doctor This is important. Contact a doctor if:  You have cramps in your legs when you walk.  You have leg pain when you are at rest.  You have coldness in a leg or foot.  Your skin changes.  You are unable to get or have an erection (erectile dysfunction).  You have cuts or sores on your feet that do not heal. Get help right away if:  Your arm or leg turns cold, numb, and blue.  Your arms or legs become red, warm, swollen, painful, or numb.  You have chest pain.  You have trouble breathing.  You suddenly have weakness in your face, arm, or leg.  You become very  confused or you cannot speak.  You suddenly have a very bad headache.  You suddenly cannot see. Summary  Peripheral vascular disease (PVD) is a disease of the blood vessels.  A simple term for PVD is poor circulation. Without treatment, PVD tends to get worse.  Treatment may include exercise, low fat and low cholesterol diet, and quitting smoking. This information is not intended to replace advice given to you by your health care provider. Make sure you discuss any questions you have with your health care provider. Document Revised: 03/28/2017 Document Reviewed: 05/23/2016 Elsevier Patient Education  2020 Elsevier Inc.  

## 2019-07-27 NOTE — Assessment & Plan Note (Signed)
Follows with oncology

## 2019-07-28 LAB — MISC LABCORP TEST (SEND OUT): Labcorp test code: 120980

## 2019-07-29 ENCOUNTER — Inpatient Hospital Stay: Payer: Medicare Other

## 2019-07-29 ENCOUNTER — Inpatient Hospital Stay: Payer: Medicare Other | Attending: Internal Medicine

## 2019-07-29 DIAGNOSIS — Z7982 Long term (current) use of aspirin: Secondary | ICD-10-CM | POA: Insufficient documentation

## 2019-07-29 DIAGNOSIS — C9110 Chronic lymphocytic leukemia of B-cell type not having achieved remission: Secondary | ICD-10-CM | POA: Insufficient documentation

## 2019-07-29 DIAGNOSIS — Z5112 Encounter for antineoplastic immunotherapy: Secondary | ICD-10-CM | POA: Insufficient documentation

## 2019-07-29 DIAGNOSIS — E875 Hyperkalemia: Secondary | ICD-10-CM | POA: Insufficient documentation

## 2019-07-29 DIAGNOSIS — D649 Anemia, unspecified: Secondary | ICD-10-CM | POA: Insufficient documentation

## 2019-07-29 NOTE — Progress Notes (Signed)
Nutrition Follow-up:  Patient with stage IV CLL.  Patient receiving gazyva.    Spoke with patient for nutrition follow-up.  Patient reports appetite is still up and down. Abdominal pain is no better.  Noted work up for porphyria.  Patient reports that constipation has improved. Having bowel movement q 2-3 days.  Using miralax but not daily and drinking prune juice.  Reports that he tried the sample oral nutrition supplements and liked them except for the boost soothe.  Has eaten egg and toast so far today.  Last night for dinner was pork chop, peas and toast.    States that he has appointment next week to see nephrologist.  Medications: reviewed  Labs: K 5.5 (stopped lokelma due to abdominal pain)  Anthropometrics:   Weight 142 lb on 3/30 decreased from 145 lb on 3/11   NUTRITION DIAGNOSIS:  Inadequate oral intake continues   INTERVENTION:  Patient states that needs to get more of the oral nutrition supplements and start drinking them.  Will continue to monitor intake of shakes and K level. Current oral intake low in K rich foods.   Patient to continue to nibble frequently during the day.   Further work-up regarding abdominal pain pending.  Patient has contact information      MONITORING, EVALUATION, GOAL: Patient will consume adequate calories and protein to prevent weight loss   NEXT VISIT: April 29 phone f/u  Kathline Banbury B. Zenia Resides, [place], [place] Registered Dietitian [phone] (pager)

## 2019-08-02 ENCOUNTER — Inpatient Hospital Stay: Payer: Medicare Other

## 2019-08-02 ENCOUNTER — Inpatient Hospital Stay (HOSPITAL_BASED_OUTPATIENT_CLINIC_OR_DEPARTMENT_OTHER): Payer: Medicare Other | Admitting: Internal Medicine

## 2019-08-02 ENCOUNTER — Encounter: Payer: Self-pay | Admitting: Internal Medicine

## 2019-08-02 ENCOUNTER — Other Ambulatory Visit: Payer: Self-pay

## 2019-08-02 DIAGNOSIS — E875 Hyperkalemia: Secondary | ICD-10-CM | POA: Diagnosis not present

## 2019-08-02 DIAGNOSIS — I70219 Atherosclerosis of native arteries of extremities with intermittent claudication, unspecified extremity: Secondary | ICD-10-CM | POA: Diagnosis not present

## 2019-08-02 DIAGNOSIS — Z7982 Long term (current) use of aspirin: Secondary | ICD-10-CM | POA: Diagnosis not present

## 2019-08-02 DIAGNOSIS — D649 Anemia, unspecified: Secondary | ICD-10-CM | POA: Diagnosis not present

## 2019-08-02 DIAGNOSIS — C9110 Chronic lymphocytic leukemia of B-cell type not having achieved remission: Secondary | ICD-10-CM

## 2019-08-02 DIAGNOSIS — Z5112 Encounter for antineoplastic immunotherapy: Secondary | ICD-10-CM | POA: Diagnosis present

## 2019-08-02 LAB — CBC WITH DIFFERENTIAL/PLATELET
Abs Immature Granulocytes: 0.05 10*3/uL (ref 0.00–0.07)
Basophils Absolute: 0 10*3/uL (ref 0.0–0.1)
Basophils Relative: 0 %
Eosinophils Absolute: 0.2 10*3/uL (ref 0.0–0.5)
Eosinophils Relative: 2 %
HCT: 28.9 % — ABNORMAL LOW (ref 39.0–52.0)
Hemoglobin: 8.4 g/dL — ABNORMAL LOW (ref 13.0–17.0)
Immature Granulocytes: 1 %
Lymphocytes Relative: 42 %
Lymphs Abs: 3.4 10*3/uL (ref 0.7–4.0)
MCH: 35 pg — ABNORMAL HIGH (ref 26.0–34.0)
MCHC: 29.1 g/dL — ABNORMAL LOW (ref 30.0–36.0)
MCV: 120.4 fL — ABNORMAL HIGH (ref 80.0–100.0)
Monocytes Absolute: 0.3 10*3/uL (ref 0.1–1.0)
Monocytes Relative: 4 %
Neutro Abs: 4.1 10*3/uL (ref 1.7–7.7)
Neutrophils Relative %: 51 %
Platelets: 143 10*3/uL — ABNORMAL LOW (ref 150–400)
RBC: 2.4 MIL/uL — ABNORMAL LOW (ref 4.22–5.81)
RDW: 16.1 % — ABNORMAL HIGH (ref 11.5–15.5)
WBC: 8.1 10*3/uL (ref 4.0–10.5)
nRBC: 0 % (ref 0.0–0.2)

## 2019-08-02 LAB — BASIC METABOLIC PANEL
Anion gap: 9 (ref 5–15)
BUN: 25 mg/dL — ABNORMAL HIGH (ref 8–23)
CO2: 23 mmol/L (ref 22–32)
Calcium: 9.1 mg/dL (ref 8.9–10.3)
Chloride: 105 mmol/L (ref 98–111)
Creatinine, Ser: 1.13 mg/dL (ref 0.61–1.24)
GFR calc Af Amer: >60 mL/min (ref >60)
GFR calc non Af Amer: >60 mL/min (ref >60)
Glucose, Bld: 144 mg/dL — ABNORMAL HIGH (ref 70–99)
Potassium: 5.3 mmol/L — ABNORMAL HIGH (ref 3.5–5.1)
Sodium: 137 mmol/L (ref 135–145)

## 2019-08-02 LAB — IRON AND TIBC
Iron: 114 ug/dL (ref 45–182)
Saturation Ratios: 29 % (ref 17.9–39.5)
TIBC: 399 ug/dL (ref 250–450)
UIBC: 285 ug/dL

## 2019-08-02 LAB — FERRITIN: Ferritin: 67 ng/mL (ref 24–336)

## 2019-08-02 LAB — LACTATE DEHYDROGENASE: LDH: 117 U/L (ref 98–192)

## 2019-08-02 MED ORDER — MONTELUKAST SODIUM 10 MG PO TABS
10.0000 mg | ORAL_TABLET | Freq: Once | ORAL | Status: AC
  Administered 2019-08-02: 10 mg via ORAL
  Filled 2019-08-02: qty 1

## 2019-08-02 MED ORDER — DIPHENHYDRAMINE HCL 50 MG/ML IJ SOLN
50.0000 mg | Freq: Once | INTRAMUSCULAR | Status: AC
  Administered 2019-08-02: 10:00:00 50 mg via INTRAVENOUS
  Filled 2019-08-02: qty 1

## 2019-08-02 MED ORDER — ACETAMINOPHEN 325 MG PO TABS
650.0000 mg | ORAL_TABLET | Freq: Once | ORAL | Status: AC
  Administered 2019-08-02: 650 mg via ORAL
  Filled 2019-08-02: qty 2

## 2019-08-02 MED ORDER — SODIUM CHLORIDE 0.9 % IV SOLN
1000.0000 mg | Freq: Once | INTRAVENOUS | Status: AC
  Administered 2019-08-02: 1000 mg via INTRAVENOUS
  Filled 2019-08-02: qty 40

## 2019-08-02 MED ORDER — SODIUM CHLORIDE 0.9 % IV SOLN
20.0000 mg | Freq: Once | INTRAVENOUS | Status: AC
  Administered 2019-08-02: 10:00:00 20 mg via INTRAVENOUS
  Filled 2019-08-02: qty 20

## 2019-08-02 MED ORDER — SODIUM CHLORIDE 0.9 % IV SOLN
Freq: Once | INTRAVENOUS | Status: AC
  Filled 2019-08-02: qty 250

## 2019-08-02 NOTE — Progress Notes (Signed)
Should there [facility] [place] OFFICE PROGRESS NOTE  Patient Care Team: [name], MD as PCP - General (Internal Medicine)  Cancer Staging No matching staging information was found for the patient.   Oncology History Overview Note  # 2006- CLL STAGE IV; MAY 2011- WBC- 57K;Platelets-99;Hb-12/CT Bulky LN; BMBx- 80% Invol; del 11; START Benda-Ritux x4 [finished Sep 2011];   # July 2015-Progression; Sep 2015-START ibrutinib; CT scan DEC 2015- Improvement LN; Cont Ibrutinib 182m/d; NOV 2016 CT- 1-2CM LN [mild progression compared to Dec 2015];NOV 2016- FISH peripheral blood- NO MUTATIONS/CD-38 Positive; NOV 7th- CONT IBRUTINIB 2 pills/day; CT AUG 2017- STABLE;  DEC 6th PET- Mild RP LN/ Retrocrural LN  # OFF ibrutinib [? intol]- sep 2019- Jan 16th 2020; Re-start Ibrutinib; September 2020-stop ibrutinib [poor tolerance/worsening anemia]  # Jan 16th 2020- start aranesp; [place]   # MARCH 11th 2021-[name]  # DEC 2019- PAIN CONTRACT  # PALLIATIVE CARE- 06/21/2019-  # October 2019-bone marrow biopsy [worsening anemia]-question dyserythropoietic changes/small clone of CLL;  # [facility] HEM- NEG.  SA skin infection [Oct 2016] s/p clinda -------------------------------------------------------    DIAGNOSIS: CLL  STAGE:   IV      ;GOALS: palliative  CURRENT/MOST RECENT THERAPY : Gazyva [C]   CLL (chronic lymphocytic leukemia) ([place]  07/08/2019 -  Chemotherapy   The patient had obinutuzumab (GAZYVA) 100 mg in sodium chloride 0.9 % 100 mL (0.9615 mg/mL) chemo infusion, 100 mg, Intravenous, Once, 1 of 6 cycles Administration: 100 mg (07/08/2019), 900 mg (07/09/2019), 1,000 mg (07/26/2019)  for chemotherapy treatment.      INTERVAL HISTORY:  James [id number].  male pleasant patient above progressive CLL and chronic abdominal pain of unclear etiology; anemia- ckd/hyperkalemia currently GDyann Kiefis here for follow-up.  Patient states he has recommended  treatment last week.  Continues to have chronic abdominal pain.  Not any worse.  Again not any better.  Continues current narcotic p.o. pain medication.  Appetite is fair.  Review of Systems  Constitutional: Positive for malaise/fatigue and weight loss. Negative for chills, diaphoresis and fever.  HENT: Negative for nosebleeds and sore throat.   Eyes: Negative for double vision.  Respiratory: Negative for cough, hemoptysis, sputum production, shortness of breath and wheezing.   Cardiovascular: Negative for chest pain, palpitations, orthopnea and leg swelling.  Gastrointestinal: Positive for abdominal pain, constipation and nausea. Negative for blood in stool, diarrhea, heartburn, melena and vomiting.  Genitourinary: Negative for dysuria, frequency and urgency.  Skin: Negative.  Negative for itching and rash.  Neurological: Negative for dizziness, tingling, focal weakness, weakness and headaches.  Endo/Heme/Allergies: Does not bruise/bleed easily.  Psychiatric/Behavioral: Negative for depression. The patient is not nervous/anxious and does not have insomnia.       PAST MEDICAL HISTORY :  Past Medical History:  Diagnosis Date  . CLL (chronic lymphocytic leukemia) ([place]   . Depression   . Diabetes mellitus without complication ([place]   . Hematuria   . Hyperlipidemia   . Hypertension     PAST SURGICAL HISTORY :   Past Surgical History:  Procedure Laterality Date  . CATARACT EXTRACTION W/ INTRAOCULAR LENS IMPLANT Bilateral   . CHOLECYSTECTOMY  1983  . COLONOSCOPY WITH PROPOFOL N/A 10/27/2017   Procedure: COLONOSCOPY WITH PROPOFOL;  Surgeon: EManya Silvas MD;  [facility];  Service: Endoscopy;  Laterality: N/A;  . ESOPHAGOGASTRODUODENOSCOPY (EGD) WITH PROPOFOL N/A 11/20/2016   Procedure: ESOPHAGOGASTRODUODENOSCOPY (EGD) WITH PROPOFOL;  Surgeon: EManya Silvas MD;  Location: [facility] ENDOSCOPY;  Service: Endoscopy;  Laterality: N/A;  . ESOPHAGOGASTRODUODENOSCOPY (EGD)  WITH PROPOFOL N/A 10/27/2017   Procedure: ESOPHAGOGASTRODUODENOSCOPY (EGD) WITH PROPOFOL;  Surgeon: Manya Silvas, MD;  Location: [facility] END[facility]COPY;  Service: Endoscopy;  Laterality: N/A;  . LOWER EXTREMITY ANGIOGRAPHY Right 05/19/2017   Procedure: LOWER EXTREMITY ANGIOGRAPHY;  Surgeon: Algernon Huxley, MD;  Location: [facility];  Service: Cardiovascular;  Laterality: Right;  . LOWER EXTREMITY ANGIOGRAPHY Left 08/10/2018   Procedure: LOWER EXTREMITY ANGIOGRAPHY;  Surgeon: Algernon Huxley, MD;  Location: [facility] LAB;  Service: Cardiovascular;  Laterality: Left;    FAMILY HISTORY :   Family History  Problem Relation Age of Onset  . Hypertension Sister     SOCIAL HISTORY:   Social History   Tobacco Use  . Smoking status: Current Every Day Smoker    Packs/day: 0.50    Years: 47.00    Pack years: 23.50    Types: Cigarettes  . Smokeless tobacco: Never Used  Substance Use Topics  . Alcohol use: Yes    Alcohol/week: 1.0 standard drinks    Types: 1 Glasses of wine per week    Comment:  almost none in last 6 months  . Drug use: No    ALLERGIES:  has No Known Allergies.  MEDICATIONS:  Current Outpatient Medications  Medication Sig Dispense Refill  . acyclovir (ZOVIRAX) 400 MG tablet One pill a day [to prevent shingles] 30 tablet 3  . aspirin EC 81 MG tablet Take 81 mg by mouth daily.    Marland Kitchen atorvastatin (LIPITOR) 10 MG tablet Take 1 tablet (10 mg total) by mouth daily. 30 tablet 11  . Calcium Carb-Cholecalciferol (CALCIUM PLUS D3 ABSORBABLE) [phone] MG-UNIT CAPS Take 1 capsule by mouth 2 (two) times daily with a meal. 60 capsule 5  . Cholecalciferol (VITAMIN D3) 125 MCG (5000 UT) CAPS Take 1 capsule (5,000 Units total) by mouth daily with breakfast. Take along with calcium and magnesium. 30 capsule 5  . clopidogrel (PLAVIX) 75 MG tablet Take 1 tablet by mouth once daily 90 tablet 0  . dexamethasone (DECADRON) 4 MG tablet Start 2 days prior to infusion; Take for 2 days. Do not  take on the day of infusion. 60 tablet 3  . Ensure (ENSURE) Take 237 mLs by mouth.    . folic acid (FOLVITE) 1 MG tablet Take 1 tablet (1 mg total) by mouth daily. 30 tablet 1  . iron polysaccharides (NU-IRON) 150 MG capsule Take 1 capsule (150 mg total) by mouth daily. 30 capsule 1  . metFORMIN (GLUCOPHAGE) 1000 MG tablet Take 1,000 mg by mouth daily with breakfast.     . montelukast (SINGULAIR) 10 MG tablet Take 1 tablet (10 mg total) by mouth at bedtime. Start 2 days prior to infusion. Take it for 4 days. 60 tablet 0  . morphine (MS CONTIN) 15 MG 12 hr tablet Take 1 tablet (15 mg total) by mouth every 12 (twelve) hours. 30 tablet 0  . oxyCODONE-acetaminophen (PERCOCET/ROXICET) 5-325 MG tablet 1 pill every 8-12 hours 90 tablet 0  . sodium polystyrene (KAYEXALATE) 15 GM/60ML suspension Take 60 mLs (15 g total) by mouth every 6 (six) hours. Until 2 loose stools 240 mL 0   No current facility-administered medications for this visit.   Facility-Administered Medications Ordered in Other Visits  Medication Dose Route Frequency Provider Last Rate Last Admin  . dexamethasone (DECADRON) 20 mg in sodium chloride 0.9 % 50 mL IVPB  20 mg Intravenous Once  Cammie Sickle, MD      . montelukast (SINGULAIR) tablet 10 mg  10 mg Oral Once Cammie Sickle, MD      . obinutuzumab (GAZYVA) 1,000 mg in sodium chloride 0.9 % 250 mL (3.4483 mg/mL) chemo infusion  1,000 mg Intravenous Once Cammie Sickle, MD        PHYSICAL EXAMINATION: ECOG PERFORMANCE STATUS: 1 - Symptomatic but completely ambulatory  BP (!) 143/82 (BP Location: Left Arm, Patient Position: Sitting, Cuff Size: Normal)   Pulse 75   Temp (!) 96 F (35.6 C) (Tympanic)   Wt 143 lb (64.9 kg)   BMI 21.74 kg/m   Filed Weights   08/02/19 0852  Weight: 143 lb (64.9 kg)    Physical Exam  Constitutional: He is oriented to person, place, and time and well-developed, well-nourished, and in no distress.  He is alone.  Appears  pale.  HENT:  Head: Normocephalic and atraumatic.  Mouth/Throat: Oropharynx is clear and moist. No oropharyngeal exudate.  Eyes: Pupils are equal, round, and reactive to light.  Cardiovascular: Normal rate and regular rhythm.  Pulmonary/Chest: No respiratory distress. He has no wheezes.  Decreased air entry bil.   Abdominal: Soft. Bowel sounds are normal. He exhibits no distension and no mass. There is no abdominal tenderness. There is no rebound and no guarding.  Musculoskeletal:        General: No tenderness or edema. Normal range of motion.     Cervical back: Normal range of motion and neck supple.  Neurological: He is alert and oriented to person, place, and time.  Skin: Skin is warm. There is pallor.  Multiple bruises noted in bilateral upper extremity.  Psychiatric: Affect normal.      LABORATORY DATA:  I have reviewed the data as listed    Component Value Date/Time   NA 137 08/02/2019 0834   NA 142 05/03/2014 1139   K 5.3 (H) 08/02/2019 0834   K 4.7 05/03/2014 1139   CL 105 08/02/2019 0834   CL 110 (H) 05/03/2014 1139   CO2 23 08/02/2019 0834   CO2 24 05/03/2014 1139   GLUCOSE 144 (H) 08/02/2019 0834   GLUCOSE 98 05/03/2014 1139   BUN 25 (H) 08/02/2019 0834   BUN 20 (H) 05/03/2014 1139   CREATININE 1.13 08/02/2019 0834   CREATININE 1.22 08/09/2014 1122   CALCIUM 9.1 08/02/2019 0834   CALCIUM 8.6 05/03/2014 1139   PROT 7.2 07/16/2019 0936   PROT 7.5 05/03/2014 1139   ALBUMIN 4.6 07/16/2019 0936   ALBUMIN 4.1 05/03/2014 1139   AST 12 (L) 07/16/2019 0936   AST 15 05/03/2014 1139   ALT 31 07/16/2019 0936   ALT 26 05/03/2014 1139   ALKPHOS 48 07/16/2019 0936   ALKPHOS 94 05/03/2014 1139   BILITOT 0.7 07/16/2019 0936   BILITOT 0.5 05/03/2014 1139   GFRNONAA >60 08/02/2019 0834   GFRNONAA >60 08/09/2014 1122   GFRAA >60 08/02/2019 0834   GFRAA >60 08/09/2014 1122    No results found for: SPEP, UPEP  Lab Results  Component Value Date   WBC 8.1 08/02/2019    NEUTROABS 4.1 08/02/2019   HGB 8.4 (L) 08/02/2019   HCT 28.9 (L) 08/02/2019   MCV 120.4 (H) 08/02/2019   PLT 143 (L) 08/02/2019      Chemistry      Component Value Date/Time   NA 137 08/02/2019 0834   NA 142 05/03/2014 1139   K 5.3 (H) 08/02/2019 0834   K 4.7  05/03/2014 1139   CL 105 08/02/2019 0834   CL 110 (H) 05/03/2014 1139   CO2 23 08/02/2019 0834   CO2 24 05/03/2014 1139   BUN 25 (H) 08/02/2019 0834   BUN 20 (H) 05/03/2014 1139   CREATININE 1.13 08/02/2019 0834   CREATININE 1.22 08/09/2014 1122      Component Value Date/Time   CALCIUM 9.1 08/02/2019 0834   CALCIUM 8.6 05/03/2014 1139   ALKPHOS 48 07/16/2019 0936   ALKPHOS 94 05/03/2014 1139   AST 12 (L) 07/16/2019 0936   AST 15 05/03/2014 1139   ALT 31 07/16/2019 0936   ALT 26 05/03/2014 1139   BILITOT 0.7 07/16/2019 0936   BILITOT 0.5 05/03/2014 1139       RADIOGRAPHIC STUDIES: I have personally reviewed the radiological images as listed and agreed with the findings in the report. No results found.   ASSESSMENT & PLAN:  CLL (chronic lymphocytic leukemia) ([place]) # CLL/SLL- relapsed most recently on ibrutinib; FEB 2021- CT Ab/Pelvis-CT scan chest and pelvis shows significant progression of axillary/mediastinal/abdominal/pelvic-however the largest lesion approximately inch in size. MARCH 2021- ~ 2.5 cm RP LN; multiple smaller LN.  Currently on Gazyva.  # proceed with Gazyva cycle #1; d-15 today. Labs today reviewed;  Acceptable. But see issues below.  #Chronic abdominal pain-unclear etiology- STABLE: Patient's urine porphyrins were slightly abnormal ; awaiting plasma fractionated porphyrin work-up.    #Hyperkalemia/CKD-III-secondary renal insufficiency/creatinine 1.3- [baseline 1.4].; K-5.3- STABLE.  # Anemia-hemoglobin ~8.4-  CKD versus progressive leukemia; STABLE.   # DISPOSITION:  # chemo today # 2 week- MD; labs- cbc/cmpLDH;HOLD tube;Gazyva-Dr.B        Orders Placed This Encounter   Procedures  . CBC with Differential    Standing Status:   Future    Standing Expiration Date:   08/01/2020  . Comprehensive metabolic panel    Standing Status:   Future    Standing Expiration Date:   08/01/2020  . Lactate dehydrogenase    Standing Status:   Future    Standing Expiration Date:   08/01/2020  . Hold Tube- Blood Bank    Standing Status:   Future    Standing Expiration Date:   08/01/2020   All questions were answered. The patient knows to call the clinic with any problems, questions or concerns.      Cammie Sickle, MD 08/02/2019 10:15 AM

## 2019-08-02 NOTE — Assessment & Plan Note (Addendum)
#   CLL/SLL- relapsed most recently on ibrutinib; FEB 2021- CT Ab/Pelvis-CT scan chest and pelvis shows significant progression of axillary/mediastinal/abdominal/pelvic-however the largest lesion approximately inch in size. MARCH 2021- ~ 2.5 cm RP LN; multiple smaller LN.  Currently on Gazyva.  # proceed with Gazyva cycle #1; d-15 today. Labs today reviewed;  Acceptable. But see issues below.  #Chronic abdominal pain-unclear etiology- STABLE: Patient's urine porphyrins were slightly abnormal ; awaiting plasma fractionated porphyrin work-up.    #Hyperkalemia/CKD-III-secondary renal insufficiency/creatinine 1.3- [baseline 1.4].; K-5.3- STABLE.  # Anemia-hemoglobin ~8.4-  CKD versus progressive leukemia; STABLE.   # DISPOSITION:  # chemo today # 2 week- MD; labs- cbc/cmpLDH;HOLD tube;Gazyva-Dr.B

## 2019-08-03 LAB — SAMPLE TO BLOOD BANK

## 2019-08-04 ENCOUNTER — Inpatient Hospital Stay (HOSPITAL_BASED_OUTPATIENT_CLINIC_OR_DEPARTMENT_OTHER): Payer: Medicare Other | Admitting: Hospice and Palliative Medicine

## 2019-08-04 DIAGNOSIS — Z515 Encounter for palliative care: Secondary | ICD-10-CM | POA: Diagnosis not present

## 2019-08-04 DIAGNOSIS — C9110 Chronic lymphocytic leukemia of B-cell type not having achieved remission: Secondary | ICD-10-CM

## 2019-08-04 NOTE — Progress Notes (Signed)
Virtual Visit via Telephone Note  I connected with James [name] on 08/04/19 at  1:30 PM EDT by telephone and verified that I am speaking with the correct person using two identifiers.   I discussed the limitations, risks, security and privacy concerns of performing an evaluation and management service by telephone and the availability of in person appointments. I also discussed with the patient that there may be a patient responsible charge related to this service. The patient expressed understanding and agreed to proceed.   History of Present Illness: James [name] is a 69 y.o. male with multiple medical problems including stage IV CLL (initially diagnosed in 2006) most recently treated with ibrutinib but was discontinued due to poor tolerance. He is now on treatment with Gazyva.  Patient has had severe and chronic abdominal pain of unclear etiology.  He has had extensive work-up including referral to [facility].  He has been followed by interventional management in the status post celiac plexus blocks.  Patient has continued to lose weight.  He was referred to palliative care to help address goals and manage ongoing symptoms.   Observations/Objective: I attempted to do a virtual visit with patient but he did not respond to texting.  Patient did answer when I called him and I did a telephone visit instead.  Patient reports that he is doing much better.  He feels stronger and was able to mow his grass yesterday.  He feels like his appetite has improved.  Weight was up slightly when patient was seen earlier this week.  Patient reports fewer distressing symptoms.  He occasionally has shortness of breath or nausea but says it is not persistent.  Patient says his pain is well controlled.  No other significant changes or concerns were reported.  Patient denies any adverse effects from his medications.  He did not require refills today.  Assessment and Plan: Stage IV CLL -on treatment with Gazyva.   Followed by Dr. Rogue Bussing.  Next clinic visit on 4/19.  Continue supportive measures.  Will follow  Follow Up Instructions: Follow-up virtual visit about a month   I discussed the assessment and treatment plan with the patient. The patient was provided an opportunity to ask questions and all were answered. The patient agreed with the plan and demonstrated an understanding of the instructions.   The patient was advised to call back or seek an in-person evaluation if the symptoms worsen or if the condition fails to improve as anticipated.  I provided 5 minutes of non-face-to-face time during this encounter.   Irean Hong, NP

## 2019-08-06 ENCOUNTER — Other Ambulatory Visit: Payer: Self-pay | Admitting: *Deleted

## 2019-08-06 ENCOUNTER — Other Ambulatory Visit: Payer: Self-pay | Admitting: Nurse Practitioner

## 2019-08-06 MED ORDER — NALOXEGOL OXALATE 25 MG PO TABS: 25.0000 mg | ORAL_TABLET | Freq: Every day | ORAL | 0 refills | Status: DC

## 2019-08-09 ENCOUNTER — Telehealth: Payer: Self-pay

## 2019-08-09 NOTE — Telephone Encounter (Signed)
Did we ever get clearance for him to stop his plavix? We can schedule the procedure if we have clearance.

## 2019-08-09 NOTE — Progress Notes (Signed)
Pharmacist Chemotherapy Monitoring - Follow Up Assessment    I verify that I have reviewed each item in the below checklist:  . Regimen for the patient is scheduled for the appropriate day and plan matches scheduled date. Marland Kitchen Appropriate non-routine labs are ordered dependent on drug ordered. . If applicable, additional medications reviewed and ordered per protocol based on lifetime cumulative doses and/or treatment regimen.   Plan for follow-up and/or issues identified: No . I-vent associated with next due treatment: No . MD and/or nursing notified: No  James [name] K 08/09/2019 8:32 AM

## 2019-08-11 ENCOUNTER — Telehealth: Payer: Self-pay | Admitting: *Deleted

## 2019-08-11 ENCOUNTER — Encounter: Payer: Self-pay | Admitting: *Deleted

## 2019-08-11 NOTE — Telephone Encounter (Signed)
James [name] ([name]) - [id number] MOVANTIK 25MG  tablets Status: PA Response - Approved Created: April 14th, 2021 Sent: April 14th, 2021

## 2019-08-12 ENCOUNTER — Other Ambulatory Visit: Payer: Self-pay | Admitting: *Deleted

## 2019-08-12 DIAGNOSIS — C9112 Chronic lymphocytic leukemia of B-cell type in relapse: Secondary | ICD-10-CM

## 2019-08-12 DIAGNOSIS — C9192 Lymphoid leukemia, unspecified, in relapse: Secondary | ICD-10-CM

## 2019-08-12 DIAGNOSIS — C8300 Small cell B-cell lymphoma, unspecified site: Secondary | ICD-10-CM

## 2019-08-12 MED ORDER — OXYCODONE-ACETAMINOPHEN 5-325 MG PO TABS: 1.0000 | ORAL_TABLET | Freq: Three times a day (TID) | ORAL | 0 refills | Status: DC | PRN

## 2019-08-13 ENCOUNTER — Encounter: Payer: Self-pay | Admitting: Internal Medicine

## 2019-08-13 ENCOUNTER — Other Ambulatory Visit: Payer: Self-pay

## 2019-08-16 ENCOUNTER — Other Ambulatory Visit: Payer: Self-pay

## 2019-08-16 ENCOUNTER — Inpatient Hospital Stay (HOSPITAL_BASED_OUTPATIENT_CLINIC_OR_DEPARTMENT_OTHER): Payer: Medicare Other | Admitting: Internal Medicine

## 2019-08-16 ENCOUNTER — Other Ambulatory Visit: Payer: Self-pay | Admitting: *Deleted

## 2019-08-16 ENCOUNTER — Inpatient Hospital Stay: Payer: Medicare Other

## 2019-08-16 VITALS — BP 136/73 | HR 67 | Resp 18

## 2019-08-16 DIAGNOSIS — C9110 Chronic lymphocytic leukemia of B-cell type not having achieved remission: Secondary | ICD-10-CM

## 2019-08-16 DIAGNOSIS — I70219 Atherosclerosis of native arteries of extremities with intermittent claudication, unspecified extremity: Secondary | ICD-10-CM

## 2019-08-16 DIAGNOSIS — D649 Anemia, unspecified: Secondary | ICD-10-CM

## 2019-08-16 DIAGNOSIS — Z5112 Encounter for antineoplastic immunotherapy: Secondary | ICD-10-CM | POA: Diagnosis not present

## 2019-08-16 LAB — CBC WITH DIFFERENTIAL/PLATELET
Abs Immature Granulocytes: 0.03 10*3/uL (ref 0.00–0.07)
Basophils Absolute: 0 10*3/uL (ref 0.0–0.1)
Basophils Relative: 0 %
Eosinophils Absolute: 0 10*3/uL (ref 0.0–0.5)
Eosinophils Relative: 0 %
HCT: 24.3 % — ABNORMAL LOW (ref 39.0–52.0)
Hemoglobin: 7.2 g/dL — ABNORMAL LOW (ref 13.0–17.0)
Immature Granulocytes: 1 %
Lymphocytes Relative: 38 %
Lymphs Abs: 2.4 10*3/uL (ref 0.7–4.0)
MCH: 34.4 pg — ABNORMAL HIGH (ref 26.0–34.0)
MCHC: 29.6 g/dL — ABNORMAL LOW (ref 30.0–36.0)
MCV: 116.3 fL — ABNORMAL HIGH (ref 80.0–100.0)
Monocytes Absolute: 0.4 10*3/uL (ref 0.1–1.0)
Monocytes Relative: 6 %
Neutro Abs: 3.4 10*3/uL (ref 1.7–7.7)
Neutrophils Relative %: 55 %
Platelets: 146 10*3/uL — ABNORMAL LOW (ref 150–400)
RBC: 2.09 MIL/uL — ABNORMAL LOW (ref 4.22–5.81)
RDW: 15.3 % (ref 11.5–15.5)
WBC: 6.2 10*3/uL (ref 4.0–10.5)
nRBC: 0 % (ref 0.0–0.2)

## 2019-08-16 LAB — COMPREHENSIVE METABOLIC PANEL
ALT: 12 U/L (ref 0–44)
AST: 11 U/L — ABNORMAL LOW (ref 15–41)
Albumin: 3.8 g/dL (ref 3.5–5.0)
Alkaline Phosphatase: 68 U/L (ref 38–126)
Anion gap: 10 (ref 5–15)
BUN: 24 mg/dL — ABNORMAL HIGH (ref 8–23)
CO2: 24 mmol/L (ref 22–32)
Calcium: 8.9 mg/dL (ref 8.9–10.3)
Chloride: 107 mmol/L (ref 98–111)
Creatinine, Ser: 1.03 mg/dL (ref 0.61–1.24)
GFR calc Af Amer: >60 mL/min (ref >60)
GFR calc non Af Amer: >60 mL/min (ref >60)
Glucose, Bld: 153 mg/dL — ABNORMAL HIGH (ref 70–99)
Potassium: 4.9 mmol/L (ref 3.5–5.1)
Sodium: 141 mmol/L (ref 135–145)
Total Bilirubin: 0.3 mg/dL (ref 0.3–1.2)
Total Protein: 6.7 g/dL (ref 6.5–8.1)

## 2019-08-16 LAB — PREPARE RBC (CROSSMATCH)

## 2019-08-16 LAB — SAMPLE TO BLOOD BANK

## 2019-08-16 LAB — LACTATE DEHYDROGENASE: LDH: 119 U/L (ref 98–192)

## 2019-08-16 MED ORDER — SODIUM CHLORIDE 0.9 % IV SOLN
20.0000 mg | Freq: Once | INTRAVENOUS | Status: AC
  Administered 2019-08-16: 11:00:00 20 mg via INTRAVENOUS
  Filled 2019-08-16: qty 20

## 2019-08-16 MED ORDER — SODIUM CHLORIDE 0.9 % IV SOLN
Freq: Once | INTRAVENOUS | Status: AC
  Filled 2019-08-16: qty 250

## 2019-08-16 MED ORDER — DIPHENHYDRAMINE HCL 50 MG/ML IJ SOLN
50.0000 mg | Freq: Once | INTRAMUSCULAR | Status: AC
  Administered 2019-08-16: 11:00:00 50 mg via INTRAVENOUS
  Filled 2019-08-16: qty 1

## 2019-08-16 MED ORDER — MONTELUKAST SODIUM 10 MG PO TABS
10.0000 mg | ORAL_TABLET | Freq: Once | ORAL | Status: AC
  Administered 2019-08-16: 11:00:00 10 mg via ORAL
  Filled 2019-08-16: qty 1

## 2019-08-16 MED ORDER — ACETAMINOPHEN 325 MG PO TABS
650.0000 mg | ORAL_TABLET | Freq: Once | ORAL | Status: AC
  Administered 2019-08-16: 650 mg via ORAL
  Filled 2019-08-16: qty 2

## 2019-08-16 MED ORDER — SODIUM CHLORIDE 0.9 % IV SOLN
1000.0000 mg | Freq: Once | INTRAVENOUS | Status: AC
  Administered 2019-08-16: 12:00:00 1000 mg via INTRAVENOUS
  Filled 2019-08-16: qty 40

## 2019-08-16 NOTE — Progress Notes (Signed)
[place] [facility] [place] OFFICE PROGRESS NOTE  Patient Care Team: [name], MD as PCP - General (Internal Medicine)  Cancer Staging No matching staging information was found for the patient.   Oncology History Overview Note  # 2006- CLL STAGE IV; MAY 2011- WBC- 57K;Platelets-99;Hb-12/CT Bulky LN; BMBx- 80% Invol; del 11; START Benda-Ritux x4 [finished Sep 2011];   # July 2015-Progression; Sep 2015-START ibrutinib; CT scan DEC 2015- Improvement LN; Cont Ibrutinib 146m/d; NOV 2016 CT- 1-2CM LN [mild progression compared to Dec 2015];NOV 2016- FISH peripheral blood- NO MUTATIONS/CD-38 Positive; NOV 7th- CONT IBRUTINIB 2 pills/day; CT AUG 2017- STABLE;  DEC 6th PET- Mild RP LN/ Retrocrural LN  # OFF ibrutinib [? intol]- sep 2019- Jan 16th 2020; Re-start Ibrutinib; September 2020-stop ibrutinib [poor tolerance/worsening anemia]  # Jan 16th 2020- start aranesp; [facility]   # MARCH 11th 2021-[name]  # DEC 2019- PAIN CONTRACT  # PALLIATIVE CARE- 06/21/2019-  # October 2019-bone marrow biopsy [worsening anemia]-question dyserythropoietic changes/small clone of CLL;  # [facility] HEM- NEG.  SA skin infection [Oct 2016] s/p clinda -------------------------------------------------------    DIAGNOSIS: CLL  STAGE:   IV      ;GOALS: palliative  CURRENT/MOST RECENT THERAPY : Gazyva [C]   CLL (chronic lymphocytic leukemia) ([place]  07/08/2019 -  Chemotherapy   The patient had obinutuzumab (GAZYVA) 100 mg in sodium chloride 0.9 % 100 mL (0.9615 mg/mL) chemo infusion, 100 mg, Intravenous, Once, 1 of 6 cycles Administration: 100 mg (07/08/2019), 900 mg (07/09/2019), 1,000 mg (07/26/2019), 1,000 mg (08/02/2019)  for chemotherapy treatment.      INTERVAL HISTORY:  FJOHNDANIEL CATLIN74y.o.  male pleasant patient above progressive CLL and chronic abdominal pain of unclear etiology; anemia- ckd/hyperkalemia currently GDyann Kiefis here for follow-up.  Patient  feels fairly well today.  However shortness of breath on exertion.  Feels fatigued.  States abdominal pain is improved.  Currently stable on narcotic pain medication.  Review of Systems  Constitutional: Positive for malaise/fatigue and weight loss. Negative for chills, diaphoresis and fever.  HENT: Negative for nosebleeds and sore throat.   Eyes: Negative for double vision.  Respiratory: Negative for cough, hemoptysis, sputum production, shortness of breath and wheezing.   Cardiovascular: Negative for chest pain, palpitations, orthopnea and leg swelling.  Gastrointestinal: Positive for abdominal pain, constipation and nausea. Negative for blood in stool, diarrhea, heartburn, melena and vomiting.  Genitourinary: Negative for dysuria, frequency and urgency.  Skin: Negative.  Negative for itching and rash.  Neurological: Negative for dizziness, tingling, focal weakness, weakness and headaches.  Endo/Heme/Allergies: Does not bruise/bleed easily.  Psychiatric/Behavioral: Negative for depression. The patient is not nervous/anxious and does not have insomnia.       PAST MEDICAL HISTORY :  Past Medical History:  Diagnosis Date  . CLL (chronic lymphocytic leukemia) ([place]   . Constipation   . Depression   . Diabetes mellitus without complication ([place]   . Hematuria   . Hyperlipidemia   . Hypertension   . Therapeutic opioid induced constipation     PAST SURGICAL HISTORY :   Past Surgical History:  Procedure Laterality Date  . CATARACT EXTRACTION W/ INTRAOCULAR LENS IMPLANT Bilateral   . CHOLECYSTECTOMY  1983  . COLONOSCOPY WITH PROPOFOL N/A 10/27/2017   Procedure: COLONOSCOPY WITH PROPOFOL;  Surgeon: EManya Silvas MD;  [facility];  Service: Endoscopy;  Laterality: N/A;  . ESOPHAGOGASTRODUODENOSCOPY (EGD) WITH PROPOFOL N/A 11/20/2016   Procedure: ESOPHAGOGASTRODUODENOSCOPY (EGD) WITH  PROPOFOL;  Surgeon: Manya Silvas, MD;  Location[facility] ENDOSCOPY;  Service: Endoscopy;   Laterality: N/A;  . ESOPHAGOGASTRODUODENOSCOPY (EGD) WITH PROPOFOL N/A 10/27/2017   Procedure: ESOPHAGOGASTRODUODENOSCOPY (EGD) WITH PROPOFOL;  Surgeon: Manya Silvas, MD;  Location: [facility] ENDOSCOPY;  Service: Endoscopy;  Laterality: N/A;  . LOWER EXTREMITY ANGIOGRAPHY Right 05/19/2017   Procedure: LOWER EXTREMITY ANGIOGRAPHY;  Surgeon: Algernon Huxley, MD;  Location: [place] CV LAB;  Service: Cardiovascular;  Laterality: Right;  . LOWER EXTREMITY ANGIOGRAPHY Left 08/10/2018   Procedure: LOWER EXTREMITY ANGIOGRAPHY;  Surgeon: Algernon Huxley, MD;  Location: [place] CV LAB;  Service: Cardiovascular;  Laterality: Left;    FAMILY HISTORY :   Family History  Problem Relation Age of Onset  . Hypertension Sister     SOCIAL HISTORY:   Social History   Tobacco Use  . Smoking status: Current Every Day Smoker    Packs/day: 0.50    Years: 47.00    Pack years: 23.50    Types: Cigarettes  . Smokeless tobacco: Never Used  Substance Use Topics  . Alcohol use: Yes    Alcohol/week: 1.0 standard drinks    Types: 1 Glasses of wine per week    Comment:  almost none in last 6 months  . Drug use: No    ALLERGIES:  has No Known Allergies.  MEDICATIONS:  Current Outpatient Medications  Medication Sig Dispense Refill  . acyclovir (ZOVIRAX) 400 MG tablet One pill a day [to prevent shingles] 30 tablet 3  . aspirin EC 81 MG tablet Take 81 mg by mouth daily.    Marland Kitchen atorvastatin (LIPITOR) 10 MG tablet Take 1 tablet (10 mg total) by mouth daily. 30 tablet 11  . Calcium Carb-Cholecalciferol (CALCIUM PLUS D3 ABSORBABLE) [phone] MG-UNIT CAPS Take 1 capsule by mouth 2 (two) times daily with a meal. 60 capsule 5  . Cholecalciferol (VITAMIN D3) 125 MCG (5000 UT) CAPS Take 1 capsule (5,000 Units total) by mouth daily with breakfast. Take along with calcium and magnesium. 30 capsule 5  . clopidogrel (PLAVIX) 75 MG tablet Take 1 tablet by mouth once daily 90 tablet 0  . dexamethasone (DECADRON) 4 MG tablet  Start 2 days prior to infusion; Take for 2 days. Do not take on the day of infusion. 60 tablet 3  . Ensure (ENSURE) Take 237 mLs by mouth.    . folic acid (FOLVITE) 1 MG tablet Take 1 tablet (1 mg total) by mouth daily. 30 tablet 1  . iron polysaccharides (NU-IRON) 150 MG capsule Take 1 capsule (150 mg total) by mouth daily. 30 capsule 1  . metFORMIN (GLUCOPHAGE) 1000 MG tablet Take 1,000 mg by mouth daily with breakfast.     . montelukast (SINGULAIR) 10 MG tablet Take 1 tablet (10 mg total) by mouth at bedtime. Start 2 days prior to infusion. Take it for 4 days. 60 tablet 0  . morphine (MS CONTIN) 15 MG 12 hr tablet Take 1 tablet (15 mg total) by mouth every 12 (twelve) hours. 30 tablet 0  . naloxegol oxalate (MOVANTIK) 25 MG TABS tablet Take 1 tablet (25 mg total) by mouth daily. 30 tablet 0  . oxyCODONE-acetaminophen (PERCOCET/ROXICET) 5-325 MG tablet Take 1 tablet by mouth every 8 (eight) hours as needed for severe pain. 90 tablet 0  . sodium polystyrene (KAYEXALATE) 15 GM/60ML suspension Take 60 mLs (15 g total) by mouth every 6 (six) hours. Until 2 loose stools (Patient not taking: Reported on 08/13/2019) 240 mL 0  No current facility-administered medications for this visit.    PHYSICAL EXAMINATION: ECOG PERFORMANCE STATUS: 1 - Symptomatic but completely ambulatory  BP (!) 149/82 (BP Location: Left Arm, Patient Position: Sitting)   Pulse 63   Temp (!) 96.2 F (35.7 C) (Tympanic)   Resp 18   Wt 154 lb 12.8 oz (70.2 kg)   SpO2 100%   BMI 23.54 kg/m   Filed Weights   08/16/19 0853  Weight: 154 lb 12.8 oz (70.2 kg)    Physical Exam  Constitutional: He is oriented to person, place, and time and well-developed, well-nourished, and in no distress.  He is alone.  Appears pale.  HENT:  Head: Normocephalic and atraumatic.  Mouth/Throat: Oropharynx is clear and moist. No oropharyngeal exudate.  Eyes: Pupils are equal, round, and reactive to light.  Cardiovascular: Normal rate and  regular rhythm.  Pulmonary/Chest: No respiratory distress. He has no wheezes.  Decreased air entry bil.   Abdominal: Soft. Bowel sounds are normal. He exhibits no distension and no mass. There is no abdominal tenderness. There is no rebound and no guarding.  Musculoskeletal:        General: No tenderness or edema. Normal range of motion.     Cervical back: Normal range of motion and neck supple.  Neurological: He is alert and oriented to person, place, and time.  Skin: Skin is warm. There is pallor.  Multiple bruises noted in bilateral upper extremity.  Psychiatric: Affect normal.      LABORATORY DATA:  I have reviewed the data as listed    Component Value Date/Time   NA 141 08/16/2019 0820   NA 142 05/03/2014 1139   K 4.9 08/16/2019 0820   K 4.7 05/03/2014 1139   CL 107 08/16/2019 0820   CL 110 (H) 05/03/2014 1139   CO2 24 08/16/2019 0820   CO2 24 05/03/2014 1139   GLUCOSE 153 (H) 08/16/2019 0820   GLUCOSE 98 05/03/2014 1139   BUN 24 (H) 08/16/2019 0820   BUN 20 (H) 05/03/2014 1139   CREATININE 1.03 08/16/2019 0820   CREATININE 1.22 08/09/2014 1122   CALCIUM 8.9 08/16/2019 0820   CALCIUM 8.6 05/03/2014 1139   PROT 6.7 08/16/2019 0820   PROT 7.5 05/03/2014 1139   ALBUMIN 3.8 08/16/2019 0820   ALBUMIN 4.1 05/03/2014 1139   AST 11 (L) 08/16/2019 0820   AST 15 05/03/2014 1139   ALT 12 08/16/2019 0820   ALT 26 05/03/2014 1139   ALKPHOS 68 08/16/2019 0820   ALKPHOS 94 05/03/2014 1139   BILITOT 0.3 08/16/2019 0820   BILITOT 0.5 05/03/2014 1139   GFRNONAA >60 08/16/2019 0820   GFRNONAA >60 08/09/2014 1122   GFRAA >60 08/16/2019 0820   GFRAA >60 08/09/2014 1122    No results found for: SPEP, UPEP  Lab Results  Component Value Date   WBC 6.2 08/16/2019   NEUTROABS 3.4 08/16/2019   HGB 7.2 (L) 08/16/2019   HCT 24.3 (L) 08/16/2019   MCV 116.3 (H) 08/16/2019   PLT 146 (L) 08/16/2019      Chemistry      Component Value Date/Time   NA 141 08/16/2019 0820   NA  142 05/03/2014 1139   K 4.9 08/16/2019 0820   K 4.7 05/03/2014 1139   CL 107 08/16/2019 0820   CL 110 (H) 05/03/2014 1139   CO2 24 08/16/2019 0820   CO2 24 05/03/2014 1139   BUN 24 (H) 08/16/2019 0820   BUN 20 (H) 05/03/2014 1139   CREATININE  1.03 08/16/2019 0820   CREATININE 1.22 08/09/2014 1122      Component Value Date/Time   CALCIUM 8.9 08/16/2019 0820   CALCIUM 8.6 05/03/2014 1139   ALKPHOS 68 08/16/2019 0820   ALKPHOS 94 05/03/2014 1139   AST 11 (L) 08/16/2019 0820   AST 15 05/03/2014 1139   ALT 12 08/16/2019 0820   ALT 26 05/03/2014 1139   BILITOT 0.3 08/16/2019 0820   BILITOT 0.5 05/03/2014 1139       RADIOGRAPHIC STUDIES: I have personally reviewed the radiological images as listed and agreed with the findings in the report. No results found.   ASSESSMENT & PLAN:  CLL (chronic lymphocytic leukemia) ([place]) # CLL/SLL- relapsed most recently on ibrutinib; FEB 2021- CT Ab/Pelvis-CT scan chest and pelvis shows significant progression of axillary/mediastinal/abdominal/pelvic-however the largest lesion approximately inch in size. MARCH 2021- ~ 2.5 cm RP LN; multiple smaller LN.  Currently on Gazyva.  # proceed with Gazyva cycle #2; d-1 today. Labs today reviewed;  Acceptable. But see issues below.  #Chronic abdominal pain-unclear etiology- STABLE: Patient's urine porphyrins were slightly abnormal ; awaiting plasma fractionated porphyrin work-up.    #Hyperkalemia/CKD-III-secondary renal insufficiency/creatinine 1.3- [baseline 1.4].; K-5.3- STABLE.  # Anemia-hemoglobin-worse than baseline.  ~7.2.-Secondary to CKD versus progressive leukemia; monitor for now. Will plan PRBC transfusion.  Discussed regarding restarting Aranesp.  # DISPOSITION:  # chemo today # PRBC transfusion 1 unit # 2 week- MD; labs- cbc/cmpLDH;HOLD tube; no chemo; Aranesp-Dr.B        Orders Placed This Encounter  Procedures  . CBC with Differential    Standing Status:   Future    Standing  Expiration Date:   08/15/2020  . Comprehensive metabolic panel    Standing Status:   Future    Standing Expiration Date:   08/15/2020  . Hold Tube- Blood Bank    Standing Status:   Future    Standing Expiration Date:   08/15/2020   All questions were answered. The patient knows to call the clinic with any problems, questions or concerns.      Cammie Sickle, MD 08/16/2019 9:36 AM

## 2019-08-16 NOTE — Progress Notes (Signed)
Labs reviewed with MD and treatment team. Per MD to continue with treatment today.  James [name] [employer]

## 2019-08-16 NOTE — Assessment & Plan Note (Addendum)
#   CLL/SLL- relapsed most recently on ibrutinib; FEB 2021- CT Ab/Pelvis-CT scan chest and pelvis shows significant progression of axillary/mediastinal/abdominal/pelvic-however the largest lesion approximately inch in size. MARCH 2021- ~ 2.5 cm RP LN; multiple smaller LN.  Currently on Gazyva.  # proceed with Gazyva cycle #2; d-1 today. Labs today reviewed;  Acceptable. But see issues below.  #Chronic abdominal pain-unclear etiology- STABLE: Patient's urine porphyrins were slightly abnormal ; awaiting plasma fractionated porphyrin work-up.    #Hyperkalemia/CKD-III-secondary renal insufficiency/creatinine 1.3- [baseline 1.4].; K-5.3- STABLE.  # Anemia-hemoglobin-worse than baseline.  ~7.2.-Secondary to CKD versus progressive leukemia; monitor for now. Will plan PRBC transfusion.  Discussed regarding restarting Aranesp.  # DISPOSITION:  # chemo today # PRBC transfusion 1 unit # 2 week- MD; labs- cbc/cmpLDH;HOLD tube; no chemo; Aranesp-Dr.B

## 2019-08-17 LAB — PORPHYRINS, FRACTIONATION-PLASMA
Coproporphyrin.: <1 ug/dL (ref 0.0–1.0)
Heptacarboxyl Porphyrins: <1 ug/dL (ref 0.0–1.0)
Hexacarboxyl Porphyrins: <1 ug/dL (ref 0.0–1.0)
Pentacarboxyl Porphyrins: <1 ug/dL (ref 0.0–1.0)
Protoporphyrin: <1 ug/dL (ref 0.0–1.0)
Uroporphyrin: <1 ug/dL (ref 0.0–1.0)

## 2019-08-18 ENCOUNTER — Encounter: Payer: Self-pay | Admitting: Internal Medicine

## 2019-08-18 ENCOUNTER — Other Ambulatory Visit: Payer: Self-pay

## 2019-08-18 ENCOUNTER — Inpatient Hospital Stay: Payer: Medicare Other

## 2019-08-18 DIAGNOSIS — Z5112 Encounter for antineoplastic immunotherapy: Secondary | ICD-10-CM | POA: Diagnosis not present

## 2019-08-18 DIAGNOSIS — C9110 Chronic lymphocytic leukemia of B-cell type not having achieved remission: Secondary | ICD-10-CM

## 2019-08-18 DIAGNOSIS — D649 Anemia, unspecified: Secondary | ICD-10-CM

## 2019-08-18 MED ORDER — SODIUM CHLORIDE 0.9% IV SOLUTION
250.0000 mL | Freq: Once | INTRAVENOUS | Status: AC
  Administered 2019-08-18: 250 mL via INTRAVENOUS
  Filled 2019-08-18: qty 250

## 2019-08-18 MED ORDER — DIPHENHYDRAMINE HCL 25 MG PO CAPS
25.0000 mg | ORAL_CAPSULE | Freq: Once | ORAL | Status: AC
  Administered 2019-08-18: 25 mg via ORAL
  Filled 2019-08-18: qty 1

## 2019-08-18 MED ORDER — ACETAMINOPHEN 325 MG PO TABS
650.0000 mg | ORAL_TABLET | Freq: Once | ORAL | Status: AC
  Administered 2019-08-18: 09:00:00 650 mg via ORAL
  Filled 2019-08-18: qty 2

## 2019-08-19 LAB — TYPE AND SCREEN
ABO/RH(D): A POS
Antibody Screen: NEGATIVE
Unit division: 0

## 2019-08-19 LAB — BPAM RBC
Blood Product Expiration Date: 202104272359
ISSUE DATE / TIME: 202104210910
Unit Type and Rh: 600

## 2019-08-26 ENCOUNTER — Inpatient Hospital Stay: Payer: Medicare Other

## 2019-08-26 NOTE — Progress Notes (Signed)
Nutrition Follow-up:  Patient with stage IV CLL.  Patient receiving gazyva.    Spoke with patient's wife as patient outside mowing the grass.  Wife reports that over the last 2 weeks appetite has improved along with abdominal pain and more regular bowel movements.  Patient is drinking oral nutrition supplements 2 times per day per wife.  Also eating 2-3 meals per day.      Medications: movantik added  Labs: reviewed  Anthropometrics:   Weight per chart 154 lb on 4/19 increased from 143 lb on 4/5 (?? Accuracy)  Wife reports she does not think he has lost anymore.    NUTRITION DIAGNOSIS: Inadequate oral intake continues   INTERVENTION:  Encouraged continued use of oral nutrition supplements for added calories and protein Continue eating high calorie, high protein foods to prevent weight loss Patient has contact information    MONITORING, EVALUATION, GOAL:  Patient will consume adequate calories and protein to prevent weight loss   NEXT VISIT: June 3rd phone f/u  Lael Pilch B. Zenia Resides, [place], [place] Registered Dietitian [phone] (pager)

## 2019-08-30 ENCOUNTER — Inpatient Hospital Stay: Payer: Medicare Other | Attending: Internal Medicine

## 2019-08-30 ENCOUNTER — Other Ambulatory Visit: Payer: Self-pay

## 2019-08-30 ENCOUNTER — Inpatient Hospital Stay: Payer: Medicare Other

## 2019-08-30 ENCOUNTER — Encounter: Payer: Self-pay | Admitting: Internal Medicine

## 2019-08-30 ENCOUNTER — Inpatient Hospital Stay (HOSPITAL_BASED_OUTPATIENT_CLINIC_OR_DEPARTMENT_OTHER): Payer: Medicare Other | Admitting: Internal Medicine

## 2019-08-30 DIAGNOSIS — N183 Chronic kidney disease, stage 3 unspecified: Secondary | ICD-10-CM | POA: Diagnosis present

## 2019-08-30 DIAGNOSIS — G8929 Other chronic pain: Secondary | ICD-10-CM | POA: Insufficient documentation

## 2019-08-30 DIAGNOSIS — R109 Unspecified abdominal pain: Secondary | ICD-10-CM | POA: Diagnosis not present

## 2019-08-30 DIAGNOSIS — D631 Anemia in chronic kidney disease: Secondary | ICD-10-CM | POA: Diagnosis not present

## 2019-08-30 DIAGNOSIS — I129 Hypertensive chronic kidney disease with stage 1 through stage 4 chronic kidney disease, or unspecified chronic kidney disease: Secondary | ICD-10-CM | POA: Diagnosis not present

## 2019-08-30 DIAGNOSIS — C9110 Chronic lymphocytic leukemia of B-cell type not having achieved remission: Secondary | ICD-10-CM

## 2019-08-30 DIAGNOSIS — E1122 Type 2 diabetes mellitus with diabetic chronic kidney disease: Secondary | ICD-10-CM | POA: Diagnosis not present

## 2019-08-30 DIAGNOSIS — L989 Disorder of the skin and subcutaneous tissue, unspecified: Secondary | ICD-10-CM | POA: Insufficient documentation

## 2019-08-30 DIAGNOSIS — I70219 Atherosclerosis of native arteries of extremities with intermittent claudication, unspecified extremity: Secondary | ICD-10-CM

## 2019-08-30 LAB — CBC WITH DIFFERENTIAL/PLATELET
Abs Immature Granulocytes: 0.02 10*3/uL (ref 0.00–0.07)
Basophils Absolute: 0.1 10*3/uL (ref 0.0–0.1)
Basophils Relative: 3 %
Eosinophils Absolute: 0.1 10*3/uL (ref 0.0–0.5)
Eosinophils Relative: 2 %
HCT: 26.7 % — ABNORMAL LOW (ref 39.0–52.0)
Hemoglobin: 8.1 g/dL — ABNORMAL LOW (ref 13.0–17.0)
Immature Granulocytes: 0 %
Lymphocytes Relative: 29 %
Lymphs Abs: 1.3 10*3/uL (ref 0.7–4.0)
MCH: 32.7 pg (ref 26.0–34.0)
MCHC: 30.3 g/dL (ref 30.0–36.0)
MCV: 107.7 fL — ABNORMAL HIGH (ref 80.0–100.0)
Monocytes Absolute: 0.7 10*3/uL (ref 0.1–1.0)
Monocytes Relative: 15 %
Neutro Abs: 2.3 10*3/uL (ref 1.7–7.7)
Neutrophils Relative %: 51 %
Platelets: 190 10*3/uL (ref 150–400)
RBC: 2.48 MIL/uL — ABNORMAL LOW (ref 4.22–5.81)
RDW: 16.6 % — ABNORMAL HIGH (ref 11.5–15.5)
Smear Review: ADEQUATE
WBC: 4.5 10*3/uL (ref 4.0–10.5)
nRBC: 0 % (ref 0.0–0.2)

## 2019-08-30 LAB — COMPREHENSIVE METABOLIC PANEL
ALT: 11 U/L (ref 0–44)
AST: 10 U/L — ABNORMAL LOW (ref 15–41)
Albumin: 3.2 g/dL — ABNORMAL LOW (ref 3.5–5.0)
Alkaline Phosphatase: 87 U/L (ref 38–126)
Anion gap: 10 (ref 5–15)
BUN: 27 mg/dL — ABNORMAL HIGH (ref 8–23)
CO2: 26 mmol/L (ref 22–32)
Calcium: 8.7 mg/dL — ABNORMAL LOW (ref 8.9–10.3)
Chloride: 106 mmol/L (ref 98–111)
Creatinine, Ser: 1.42 mg/dL — ABNORMAL HIGH (ref 0.61–1.24)
GFR calc Af Amer: 58 mL/min — ABNORMAL LOW (ref >60)
GFR calc non Af Amer: 50 mL/min — ABNORMAL LOW (ref >60)
Glucose, Bld: 152 mg/dL — ABNORMAL HIGH (ref 70–99)
Potassium: 5 mmol/L (ref 3.5–5.1)
Sodium: 142 mmol/L (ref 135–145)
Total Bilirubin: 0.5 mg/dL (ref 0.3–1.2)
Total Protein: 6.7 g/dL (ref 6.5–8.1)

## 2019-08-30 LAB — SAMPLE TO BLOOD BANK

## 2019-08-30 MED ORDER — DARBEPOETIN ALFA 300 MCG/0.6ML IJ SOSY
300.0000 ug | PREFILLED_SYRINGE | Freq: Once | INTRAMUSCULAR | Status: AC
  Administered 2019-08-30: 300 ug via SUBCUTANEOUS
  Filled 2019-08-30: qty 0.6

## 2019-08-30 NOTE — Assessment & Plan Note (Addendum)
#   CLL/SLL- relapsed most recently on ibrutinib; FEB 2021- CT Ab/Pelvis-CT scan chest and pelvis shows significant progression of axillary/mediastinal/abdominal/pelvic-however the largest lesion approximately inch in size. MARCH 2021- ~ 2.5 cm RP LN; multiple smaller LN.  Currently on Gazyva.  # currently s/p Gazyva cycle #2; d-1 ~  2 weeks ago Labs today reviewed; tolerating fairly well.  Proceed with cycle #3 in 2 weeks.  #Chronic abdominal pain-unclear etiology-stable patient's urine porphyrins were slightly abnormal ; awaiting plasma fractionated porphyrin work-up.    #Hyperkalemia/CKD-III-secondary renal insufficiency/creatinine 1.3- [baseline 1.4].;  Stable  # Anemia-hemoglobin-worse than baseline.  8.1.-Secondary to CKD versus progressive leukemia; monitor for now.  Status post PRBC transfusion.  Proceed with Aranesp today.  # DISPOSITION:  # No blood today; aranesp today # 2 week- MD; labs- cbc/cmpLDH;HOLD tube; Gazyva; possible aranesp-Dr.B

## 2019-08-30 NOTE — Progress Notes (Signed)
[facility] OFFICE PROGRESS NOTE  Patient Care Team: [name], MD as PCP - General (Internal Medicine) Cammie Sickle, MD as Consulting Physician (Internal Medicine)  Cancer Staging No matching staging information was found for the patient.   Oncology History Overview Note  # 2006- CLL STAGE IV; MAY 2011- WBC- 57K;Platelets-99;Hb-12/CT Bulky LN; BMBx- 80% Invol; del 11; START Benda-Ritux x4 [finished Sep 2011];   # July 2015-Progression; Sep 2015-START ibrutinib; CT scan DEC 2015- Improvement LN; Cont Ibrutinib 173m/d; NOV 2016 CT- 1-2CM LN [mild progression compared to Dec 2015];NOV 2016- FISH peripheral blood- NO MUTATIONS/CD-38 Positive; NOV 7th- CONT IBRUTINIB 2 pills/day; CT AUG 2017- STABLE;  DEC 6th PET- Mild RP LN/ Retrocrural LN  # OFF ibrutinib [? intol]- sep 2019- Jan 16th 2020; Re-start Ibrutinib; September 2020-stop ibrutinib [poor tolerance/worsening anemia]  # Jan 16th 2020- start aranesp; HOLD while on [facility]   # MARCH 11th 2021-James [name]  # DEC 2019- PAIN CONTRACT  # PALLIATIVE CARE- 06/21/2019-  # October 2019-bone marrow biopsy [worsening anemia]-question dyserythropoietic changes/small clone of CLL;  # [facility] HEM- NEG.  SA skin infection [Oct 2016] s/p clinda -------------------------------------------------------    DIAGNOSIS: CLL  STAGE:   IV      ;GOALS: palliative  CURRENT/MOST RECENT THERAPY : Gazyva [C]   [place]) [place]  07/08/2019 -  Chemotherapy   The patient had obinutuzumab (GAZYVA) 100 mg in sodium chloride 0.9 % 100 mL (0.9615 mg/mL) chemo infusion, 100 mg, Intravenous, Once, 2 of 6 cycles Administration: 100 mg (07/08/2019), 900 mg (07/09/2019), 1,000 mg (07/26/2019), 1,000 mg (08/16/2019), 1,000 mg (08/02/2019)  for chemotherapy treatment.      INTERVAL HISTORY:  James [id number].[id number].  male pleasant patient above progressive CLL and chronic abdominal pain of  unclear etiology; anemia- ckd/hyperkalemia currently GDyann Kiefis here for follow-up.  Patient had cycle #2 Gazyva approximately 2 weeks ago.  In the interim patient had 1 unit PRBC transfusion for hemoglobin 7.2.  Patient complains of chronic fatigue.  Chronic shortness of breath on exertion.  Chronic abdominal pain overall stable narcotics.  Review of Systems  Constitutional: Positive for malaise/fatigue and weight loss. Negative for chills, diaphoresis and fever.  HENT: Negative for nosebleeds and sore throat.   Eyes: Negative for double vision.  Respiratory: Negative for cough, hemoptysis, sputum production, shortness of breath and wheezing.   Cardiovascular: Negative for chest pain, palpitations, orthopnea and leg swelling.  Gastrointestinal: Positive for abdominal pain, constipation and nausea. Negative for blood in stool, diarrhea, heartburn, melena and vomiting.  Genitourinary: Negative for dysuria, frequency and urgency.  Skin: Negative.  Negative for itching and rash.  Neurological: Negative for dizziness, tingling, focal weakness, weakness and headaches.  Endo/Heme/Allergies: Does not bruise/bleed easily.  Psychiatric/Behavioral: Negative for depression. The patient is not nervous/anxious and does not have insomnia.       PAST MEDICAL HISTORY :  Past Medical History:  Diagnosis Date  . C[place] ([place] lymphocytic leukemia) ([place]   . Constipation   . Depression   . Diabetes mellitus without complication ([place]   . Hematuria   . Hyperlipidemia   . Hypertension   . Therapeutic opioid induced constipation     PAST SURGICAL HISTORY :   Past Surgical History:  Procedure Laterality Date  . CATARACT EXTRACTION W/ INTRAOCULAR LENS IMPLANT Bilateral   . CHOLECYSTECTOMY  1983  . COLONOSCOPY WITH PROPOFOL N/A 10/27/2017   Procedure: COLONOSCOPY WITH PROPOFOL;  Surgeon: EGaylyn Cheers  T, MD;  Location: [facility] ENDOSCOPY;  Service: Endoscopy;  Laterality: N/A;  . ESOPHAGOGASTRODUODENOSCOPY  (EGD) WITH PROPOFOL N/A 11/20/2016   Procedure: ESOPHAGOGASTRODUODENOSCOPY (EGD) WITH PROPOFOL;  Surgeon: Manya Silvas, MD;  Location: [facility] ENDOSCOPY;  Service: Endoscopy;  Laterality: N/A;  . ESOPHAGOGASTRODUODENOSCOPY (EGD) WITH PROPOFOL N/A 10/27/2017   Procedure: ESOPHAGOGASTRODUODENOSCOPY (EGD) WITH PROPOFOL;  Surgeon: Manya Silvas, MD;  [facility] ENDOSCOPY;  Service: Endoscopy;  Laterality: N/A;  . LOWER EXTREMITY ANGIOGRAPHY Right 05/19/2017   Procedure: LOWER EXTREMITY ANGIOGRAPHY;  Surgeon: Algernon Huxley, MD;  Location: [place] CV LAB;  Service: Cardiovascular;  Laterality: Right;  . LOWER EXTREMITY ANGIOGRAPHY Left 08/10/2018   Procedure: LOWER EXTREMITY ANGIOGRAPHY;  Surgeon: Algernon Huxley, MD;  Location: [place] CV LAB;  Service: Cardiovascular;  Laterality: Left;    FAMILY HISTORY :   Family History  Problem Relation Age of Onset  . Hypertension Sister     SOCIAL HISTORY:   Social History   Tobacco Use  . Smoking status: Current Every Day Smoker    Packs/day: 0.50    Years: 47.00    Pack years: 23.50    Types: Cigarettes  . Smokeless tobacco: Never Used  Substance Use Topics  . Alcohol use: Yes    Alcohol/week: 1.0 standard drinks    Types: 1 Glasses of wine per week    Comment:  almost none in last 6 months  . Drug use: No    ALLERGIES:  has No Known Allergies.  MEDICATIONS:  Current Outpatient Medications  Medication Sig Dispense Refill  . acyclovir (ZOVIRAX) 400 MG tablet One pill a day [to prevent shingles] 30 tablet 3  . aspirin EC 81 MG tablet Take 81 mg by mouth daily.    Marland Kitchen atorvastatin (LIPITOR) 10 MG tablet Take 1 tablet (10 mg total) by mouth daily. 30 tablet 11  . Calcium Carb-Cholecalciferol (CALCIUM PLUS D3 ABSORBABLE) [phone] MG-UNIT CAPS Take 1 capsule by mouth 2 (two) times daily with a meal. 60 capsule 5  . Cholecalciferol (VITAMIN D3) 125 MCG (5000 UT) CAPS Take 1 capsule (5,000 Units total) by mouth daily with breakfast.  Take along with calcium and magnesium. 30 capsule 5  . clopidogrel (PLAVIX) 75 MG tablet Take 1 tablet by mouth once daily 90 tablet 0  . dexamethasone (DECADRON) 4 MG tablet Start 2 days prior to infusion; Take for 2 days. Do not take on the day of infusion. 60 tablet 3  . Ensure (ENSURE) Take 237 mLs by mouth.    . folic acid (FOLVITE) 1 MG tablet Take 1 tablet (1 mg total) by mouth daily. 30 tablet 1  . iron polysaccharides (NU-IRON) 150 MG capsule Take 1 capsule (150 mg total) by mouth daily. 30 capsule 1  . metFORMIN (GLUCOPHAGE) 1000 MG tablet Take 1,000 mg by mouth daily with breakfast.     . montelukast (SINGULAIR) 10 MG tablet Take 1 tablet (10 mg total) by mouth at bedtime. Start 2 days prior to infusion. Take it for 4 days. 60 tablet 0  . morphine (MS CONTIN) 15 MG 12 hr tablet Take 1 tablet (15 mg total) by mouth every 12 (twelve) hours. 30 tablet 0  . naloxegol oxalate (MOVANTIK) 25 MG TABS tablet Take 1 tablet (25 mg total) by mouth daily. 30 tablet 0  . oxyCODONE-acetaminophen (PERCOCET/ROXICET) 5-325 MG tablet Take 1 tablet by mouth every 8 (eight) hours as needed for severe pain. 90 tablet 0  . sodium polystyrene (KAYEXALATE) 15 GM/60ML suspension Take  60 mLs (15 g total) by mouth every 6 (six) hours. Until 2 loose stools 240 mL 0   No current facility-administered medications for this visit.    PHYSICAL EXAMINATION: ECOG PERFORMANCE STATUS: 1 - Symptomatic but completely ambulatory  BP 131/82 (BP Location: Left Arm, Patient Position: Sitting, Cuff Size: Normal)   Pulse (!) 108   Temp (!) 95.6 F (35.3 C) (Tympanic)   Wt 147 lb (66.7 kg)   BMI 22.35 kg/m   Filed Weights   08/30/19 0936  Weight: 147 lb (66.7 kg)    Physical Exam  Constitutional: He is oriented to person, place, and time and well-developed, well-nourished, and in no distress.  He is alone.  Appears pale.  HENT:  Head: Normocephalic and atraumatic.  Mouth/Throat: Oropharynx is clear and moist. No  oropharyngeal exudate.  Eyes: Pupils are equal, round, and reactive to light.  Cardiovascular: Normal rate and regular rhythm.  Pulmonary/Chest: No respiratory distress. He has no wheezes.  Decreased air entry bil.   Abdominal: Soft. Bowel sounds are normal. He exhibits no distension and no mass. There is no abdominal tenderness. There is no rebound and no guarding.  Musculoskeletal:        General: No tenderness or edema. Normal range of motion.     Cervical back: Normal range of motion and neck supple.  Neurological: He is alert and oriented to person, place, and time.  Skin: Skin is warm. There is pallor.  Multiple bruises noted in bilateral upper extremity.  Psychiatric: Affect normal.      LABORATORY DATA:  I have reviewed the data as listed    Component Value Date/Time   NA 142 08/30/2019 0931   NA 142 05/03/2014 1139   K 5.0 08/30/2019 0931   K 4.7 05/03/2014 1139   CL 106 08/30/2019 0931   CL 110 (H) 05/03/2014 1139   CO2 26 08/30/2019 0931   CO2 24 05/03/2014 1139   GLUCOSE 152 (H) 08/30/2019 0931   GLUCOSE 98 05/03/2014 1139   BUN 27 (H) 08/30/2019 0931   BUN 20 (H) 05/03/2014 1139   CREATININE 1.42 (H) 08/30/2019 0931   CREATININE 1.22 08/09/2014 1122   CALCIUM 8.7 (L) 08/30/2019 0931   CALCIUM 8.6 05/03/2014 1139   PROT 6.7 08/30/2019 0931   PROT 7.5 05/03/2014 1139   ALBUMIN 3.2 (L) 08/30/2019 0931   ALBUMIN 4.1 05/03/2014 1139   AST 10 (L) 08/30/2019 0931   AST 15 05/03/2014 1139   ALT 11 08/30/2019 0931   ALT 26 05/03/2014 1139   ALKPHOS 87 08/30/2019 0931   ALKPHOS 94 05/03/2014 1139   BILITOT 0.5 08/30/2019 0931   BILITOT 0.5 05/03/2014 1139   GFRNONAA 50 (L) 08/30/2019 0931   GFRNONAA >60 08/09/2014 1122   GFRAA 58 (L) 08/30/2019 0931   GFRAA >60 08/09/2014 1122    No results found for: SPEP, UPEP  Lab Results  Component Value Date   WBC 4.5 08/30/2019   NEUTROABS 2.3 08/30/2019   HGB 8.1 (L) 08/30/2019   HCT 26.7 (L) 08/30/2019   MCV  107.7 (H) 08/30/2019   PLT 190 08/30/2019      Chemistry      Component Value Date/Time   NA 142 08/30/2019 0931   NA 142 05/03/2014 1139   K 5.0 08/30/2019 0931   K 4.7 05/03/2014 1139   CL 106 08/30/2019 0931   CL 110 (H) 05/03/2014 1139   CO2 26 08/30/2019 0931   CO2 24 05/03/2014 1139  BUN 27 (H) 08/30/2019 0931   BUN 20 (H) 05/03/2014 1139   CREATININE 1.42 (H) 08/30/2019 0931   CREATININE 1.22 08/09/2014 1122      Component Value Date/Time   CALCIUM 8.7 (L) 08/30/2019 0931   CALCIUM 8.6 05/03/2014 1139   ALKPHOS 87 08/30/2019 0931   ALKPHOS 94 05/03/2014 1139   AST 10 (L) 08/30/2019 0931   AST 15 05/03/2014 1139   ALT 11 08/30/2019 0931   ALT 26 05/03/2014 1139   BILITOT 0.5 08/30/2019 0931   BILITOT 0.5 05/03/2014 1139       RADIOGRAPHIC STUDIES: I have personally reviewed the radiological images as listed and agreed with the findings in the report. No results found.   ASSESSMENT & PLAN:  CLL (chronic lymphocytic leukemia) ([place]) # CLL/SLL- relapsed most recently on ibrutinib; FEB 2021- CT Ab/Pelvis-CT scan chest and pelvis shows significant progression of axillary/mediastinal/abdominal/pelvic-however the largest lesion approximately inch in size. MARCH 2021- ~ 2.5 cm RP LN; multiple smaller LN.  Currently on Gazyva.  # currently s/p Gazyva cycle #2; d-1 ~  2 weeks ago Labs today reviewed; tolerating fairly well.  Proceed with cycle #3 in 2 weeks.  #Chronic abdominal pain-unclear etiology-stable patient's urine porphyrins were slightly abnormal ; awaiting plasma fractionated porphyrin work-up.    #Hyperkalemia/CKD-III-secondary renal insufficiency/creatinine 1.3- [baseline 1.4].;  Stable  # Anemia-hemoglobin-worse than baseline.  8.1.-Secondary to CKD versus progressive leukemia; monitor for now.  Status post PRBC transfusion.  Proceed with Aranesp today.  # DISPOSITION:  # No blood today; aranesp today # 2 week- MD; labs- cbc/cmpLDH;HOLD tube; Gazyva;  possible aranesp-Dr.B        Orders Placed This Encounter  Procedures  . CBC with Differential    Standing Status:   Future    Standing Expiration Date:   08/29/2020  . Comprehensive metabolic panel    Standing Status:   Future    Standing Expiration Date:   08/29/2020  . Hold Tube- Blood Bank    Standing Status:   Future    Standing Expiration Date:   08/29/2020   All questions were answered. The patient knows to call the clinic with any problems, questions or concerns.      Cammie Sickle, MD 08/30/2019 10:50 AM

## 2019-09-01 ENCOUNTER — Inpatient Hospital Stay (HOSPITAL_BASED_OUTPATIENT_CLINIC_OR_DEPARTMENT_OTHER): Payer: Medicare Other | Admitting: Hospice and Palliative Medicine

## 2019-09-01 DIAGNOSIS — Z515 Encounter for palliative care: Secondary | ICD-10-CM

## 2019-09-01 NOTE — Progress Notes (Signed)
Voicemail left.  Will reschedule visit.

## 2019-09-06 NOTE — Progress Notes (Signed)

## 2019-09-10 ENCOUNTER — Other Ambulatory Visit: Payer: Self-pay

## 2019-09-10 ENCOUNTER — Other Ambulatory Visit: Payer: Self-pay | Admitting: *Deleted

## 2019-09-10 DIAGNOSIS — C9112 Chronic lymphocytic leukemia of B-cell type in relapse: Secondary | ICD-10-CM

## 2019-09-10 DIAGNOSIS — C8300 Small cell B-cell lymphoma, unspecified site: Secondary | ICD-10-CM

## 2019-09-10 MED ORDER — OXYCODONE-ACETAMINOPHEN 5-325 MG PO TABS: 1.0000 | ORAL_TABLET | Freq: Three times a day (TID) | ORAL | 0 refills | Status: DC | PRN

## 2019-09-12 ENCOUNTER — Emergency Department
Admission: EM | Admit: 2019-09-12 | Discharge: 2019-09-12 | Disposition: A | Discharge status: Home / Self Care | Payer: Medicare Other | Attending: Emergency Medicine | Admitting: Emergency Medicine

## 2019-09-12 ENCOUNTER — Other Ambulatory Visit: Payer: Self-pay

## 2019-09-12 ENCOUNTER — Emergency Department: Payer: Medicare Other

## 2019-09-12 DIAGNOSIS — R1084 Generalized abdominal pain: Secondary | ICD-10-CM | POA: Insufficient documentation

## 2019-09-12 DIAGNOSIS — F17210 Nicotine dependence, cigarettes, uncomplicated: Secondary | ICD-10-CM | POA: Diagnosis not present

## 2019-09-12 DIAGNOSIS — Z7984 Long term (current) use of oral hypoglycemic drugs: Secondary | ICD-10-CM | POA: Insufficient documentation

## 2019-09-12 DIAGNOSIS — Z856 Personal history of leukemia: Secondary | ICD-10-CM | POA: Diagnosis not present

## 2019-09-12 DIAGNOSIS — Z79899 Other long term (current) drug therapy: Secondary | ICD-10-CM | POA: Diagnosis not present

## 2019-09-12 DIAGNOSIS — R1033 Periumbilical pain: Secondary | ICD-10-CM | POA: Diagnosis present

## 2019-09-12 DIAGNOSIS — I129 Hypertensive chronic kidney disease with stage 1 through stage 4 chronic kidney disease, or unspecified chronic kidney disease: Secondary | ICD-10-CM | POA: Insufficient documentation

## 2019-09-12 DIAGNOSIS — Z7902 Long term (current) use of antithrombotics/antiplatelets: Secondary | ICD-10-CM | POA: Diagnosis not present

## 2019-09-12 DIAGNOSIS — E1122 Type 2 diabetes mellitus with diabetic chronic kidney disease: Secondary | ICD-10-CM | POA: Diagnosis not present

## 2019-09-12 DIAGNOSIS — N183 Chronic kidney disease, stage 3 unspecified: Secondary | ICD-10-CM | POA: Diagnosis not present

## 2019-09-12 DIAGNOSIS — Z7982 Long term (current) use of aspirin: Secondary | ICD-10-CM | POA: Diagnosis not present

## 2019-09-12 DIAGNOSIS — R0602 Shortness of breath: Secondary | ICD-10-CM | POA: Insufficient documentation

## 2019-09-12 LAB — BASIC METABOLIC PANEL
Anion gap: 15 (ref 5–15)
BUN: 47 mg/dL — ABNORMAL HIGH (ref 8–23)
CO2: 26 mmol/L (ref 22–32)
Calcium: 9.5 mg/dL (ref 8.9–10.3)
Chloride: 100 mmol/L (ref 98–111)
Creatinine, Ser: 1.95 mg/dL — ABNORMAL HIGH (ref 0.61–1.24)
GFR calc Af Amer: 39 mL/min — ABNORMAL LOW (ref >60)
GFR calc non Af Amer: 34 mL/min — ABNORMAL LOW (ref >60)
Glucose, Bld: 198 mg/dL — ABNORMAL HIGH (ref 70–99)
Potassium: 5 mmol/L (ref 3.5–5.1)
Sodium: 141 mmol/L (ref 135–145)

## 2019-09-12 LAB — CBC
HCT: 28.2 % — ABNORMAL LOW (ref 39.0–52.0)
Hemoglobin: 8.5 g/dL — ABNORMAL LOW (ref 13.0–17.0)
MCH: 33.1 pg (ref 26.0–34.0)
MCHC: 30.1 g/dL (ref 30.0–36.0)
MCV: 109.7 fL — ABNORMAL HIGH (ref 80.0–100.0)
Platelets: 421 10*3/uL — ABNORMAL HIGH (ref 150–400)
RBC: 2.57 MIL/uL — ABNORMAL LOW (ref 4.22–5.81)
RDW: 17.1 % — ABNORMAL HIGH (ref 11.5–15.5)
WBC: 6.6 10*3/uL (ref 4.0–10.5)
nRBC: 0 % (ref 0.0–0.2)

## 2019-09-12 LAB — LIPASE, BLOOD: Lipase: 23 U/L (ref 11–51)

## 2019-09-12 LAB — HEPATIC FUNCTION PANEL
ALT: 11 U/L (ref 0–44)
AST: 16 U/L (ref 15–41)
Albumin: 3.8 g/dL (ref 3.5–5.0)
Alkaline Phosphatase: 80 U/L (ref 38–126)
Bilirubin, Direct: <0.1 mg/dL (ref 0.0–0.2)
Total Bilirubin: 0.8 mg/dL (ref 0.3–1.2)
Total Protein: 7 g/dL (ref 6.5–8.1)

## 2019-09-12 LAB — TROPONIN I (HIGH SENSITIVITY): Troponin I (High Sensitivity): 8 ng/L (ref <18)

## 2019-09-12 MED ORDER — IPRATROPIUM-ALBUTEROL 0.5-2.5 (3) MG/3ML IN SOLN
3.0000 mL | Freq: Once | RESPIRATORY_TRACT | Status: AC
  Administered 2019-09-12: 3 mL via RESPIRATORY_TRACT
  Filled 2019-09-12: qty 3

## 2019-09-12 MED ORDER — ONDANSETRON HCL 4 MG/2ML IJ SOLN
4.0000 mg | Freq: Once | INTRAMUSCULAR | Status: AC
  Administered 2019-09-12: 4 mg via INTRAVENOUS
  Filled 2019-09-12: qty 2

## 2019-09-12 MED ORDER — SODIUM CHLORIDE 0.9 % IV SOLN
1000.0000 mL | Freq: Once | INTRAVENOUS | Status: AC
  Administered 2019-09-12: 1000 mL via INTRAVENOUS

## 2019-09-12 MED ORDER — SODIUM CHLORIDE 0.9% FLUSH: 3.0000 mL | Freq: Once | INTRAVENOUS | Status: DC

## 2019-09-12 MED ORDER — MORPHINE SULFATE (PF) 4 MG/ML IV SOLN
4.0000 mg | Freq: Once | INTRAVENOUS | Status: AC
  Administered 2019-09-12: 4 mg via INTRAVENOUS
  Filled 2019-09-12: qty 1

## 2019-09-12 NOTE — ED Notes (Signed)
First Nurse Note: Pt to ED via POV c/o abd pain and shob. Pt is in NAD.

## 2019-09-12 NOTE — ED Triage Notes (Signed)
Pt comes via POV from home with c/o SOb and abdominal pain. Pt states SOB that started over a week ago. Pt states epigastric pain.  Pt currently on chemo and last treatment May 3rd.  Pt denies any N/V/D. Pt denies any CP

## 2019-09-12 NOTE — ED Notes (Signed)
Pt transported to xray 

## 2019-09-12 NOTE — ED Provider Notes (Signed)
[facility] Emergency Department Provider Note   ____________________________________________    I have reviewed the triage vital signs and the nursing notes.   HISTORY  Chief Complaint Abdominal pain    HPI James [name] is a 69 y.o. male with history of CLL, diabetes, chronic abdominal pain who presents today with abdominal pain.  Patient describes cramping periumbilical abdominal pain which is consistent with his typical pain.  He has been taking oxycodone at home with little improvement.  He denies fevers or chills.  Some nausea no vomiting.  Does follow with Dr. Rogue Bussing of oncology for his CLL, saw him recently.  Past Medical History:  Diagnosis Date  . CLL (chronic lymphocytic leukemia) ([place])   . Constipation   . Depression   . Diabetes mellitus without complication ([place])   . Hematuria   . Hyperlipidemia   . Hypertension   . Therapeutic opioid induced constipation     Patient Active Problem List   Diagnosis Date Noted  . DDD (degenerative disc disease), thoracic 07/12/2019  . DDD (degenerative disc disease), lumbosacral 07/12/2019  . Lumbar facet hypertrophy ([place]) 07/12/2019  . Cancer-related pain 07/12/2019  . Elevated sed rate 07/12/2019  . Abdominal wall pain in epigastric region 06/15/2019  . Thoracic radiculitis 06/15/2019  . Right-sided low back pain without sciatica 06/15/2019  . Bradycardia, unspecified 06/01/2019  . Hypotension 06/01/2019  . Chronic anticoagulation (Plavix) 05/19/2019  . Vitamin D deficiency 05/19/2019  . Chronic pain syndrome 05/03/2019  . Pharmacologic therapy 05/03/2019  . Disorder of skeletal system 05/03/2019  . Problems influencing health status 05/03/2019  . Chronic abdominal pain 05/03/2019  . Elevated uric acid in blood 05/03/2019  . Chronic prescription opiate use 05/03/2019  . Enlarged prostate 05/03/2019  . Folic acid deficiency 33/00/7622  . Major depressive disorder, recurrent,  mild ([place]) 03/08/2019  . Shortness of breath 12/28/2018  . Acute on chronic renal failure ([place]) 12/28/2018  . SOB (shortness of breath) 12/28/2018  . Portal hypertensive gastropathy ([place]) 09/25/2018  . CKD stage 3 secondary to diabetes ([place]) 08/04/2018  . Atherosclerosis of native arteries of the extremities with ulceration ([place]) 08/04/2018  . Stage 3 chronic kidney disease 08/04/2018  . Anemia due to stage 3 chronic kidney disease 04/15/2018  . Gastroesophageal reflux disease without esophagitis 08/22/2017  . Chronic epigastric pain (1ry area of Pain) 08/21/2017  . Hx of adenomatous colonic polyps 08/21/2017  . Atherosclerosis of native arteries of extremity with rest pain ([place]) 05/13/2017  . Hyperlipidemia 05/06/2017  . Atherosclerotic peripheral vascular disease with intermittent claudication ([place]) 05/06/2017  . Toe cyanosis 05/06/2017  . AVM (arteriovenous malformation) of duodenum, acquired with hemorrhage 12/12/2016  . Thrombocytopenia ([place]) 12/12/2016  . Anemia 10/16/2016  . Current smoker 10/16/2016  . Malignant lymphoma, small lymphocytic ([place]) 03/25/2016  . Generalized abdominal pain 12/15/2015  . CLL (chronic lymphocytic leukemia) ([place]) 12/26/2013  . Diabetes mellitus type 2, uncomplicated ([place]) 63/33/5456  . Hypertension 12/26/2013    Past Surgical History:  Procedure Laterality Date  . CATARACT EXTRACTION W/ INTRAOCULAR LENS IMPLANT Bilateral   . CHOLECYSTECTOMY  1983  . COLONOSCOPY WITH PROPOFOL N/A 10/27/2017   Procedure: COLONOSCOPY WITH PROPOFOL;  Surgeon: Manya Silvas, MD;  Location: [facility] ENDOSCOPY;  Service: Endoscopy;  Laterality: N/A;  . ESOPHAGOGASTRODUODENOSCOPY (EGD) WITH PROPOFOL N/A 11/20/2016   Procedure: ESOPHAGOGASTRODUODENOSCOPY (EGD) WITH PROPOFOL;  Surgeon: Manya Silvas, MD;  Location: [facility] ENDOSCOPY;  Service: Endoscopy;  Laterality: N/A;  . ESOPHAGOGASTRODUODENOSCOPY (EGD) WITH PROPOFOL N/A  10/27/2017   Procedure: ESOPHAGOGASTRODUODENOSCOPY  (EGD) WITH PROPOFOL;  Surgeon: Manya Silvas, MD;  Location: [facility] ENDOSCOPY;  Service: Endoscopy;  Laterality: N/A;  . LOWER EXTREMITY ANGIOGRAPHY Right 05/19/2017   Procedure: LOWER EXTREMITY ANGIOGRAPHY;  Surgeon: Algernon Huxley, MD;  Location: [place] CV LAB;  Service: Cardiovascular;  Laterality: Right;  . LOWER EXTREMITY ANGIOGRAPHY Left 08/10/2018   Procedure: LOWER EXTREMITY ANGIOGRAPHY;  Surgeon: Algernon Huxley, MD;  Location: [place] CV LAB;  Service: Cardiovascular;  Laterality: Left;    Prior to Admission medications   Medication Sig Start Date End Date Taking? Authorizing Provider  acyclovir (ZOVIRAX) 400 MG tablet One pill a day [to prevent shingles] 06/07/19   Cammie Sickle, MD  aspirin EC 81 MG tablet Take 81 mg by mouth daily.    [provider]  atorvastatin (LIPITOR) 10 MG tablet Take 1 tablet (10 mg total) by mouth daily. 05/19/17 06/12/27  Algernon Huxley, MD  Calcium Carb-Cholecalciferol (CALCIUM PLUS D3 ABSORBABLE) [phone] MG-UNIT CAPS Take 1 capsule by mouth 2 (two) times daily with a meal. 05/19/19 11/15/19  Milinda Pointer, MD  Cholecalciferol (VITAMIN D3) 125 MCG (5000 UT) CAPS Take 1 capsule (5,000 Units total) by mouth daily with breakfast. Take along with calcium and magnesium. 05/19/19 11/15/19  Milinda Pointer, MD  clopidogrel (PLAVIX) 75 MG tablet Take 1 tablet by mouth once daily 06/21/19   Kris Hartmann, NP  dexamethasone (DECADRON) 4 MG tablet Start 2 days prior to infusion; Take for 2 days. Do not take on the day of infusion. 06/07/19   Cammie Sickle, MD  Ensure (ENSURE) Take 237 mLs by mouth.    [provider]  folic acid (FOLVITE) 1 MG tablet Take 1 tablet (1 mg total) by mouth daily. Patient not taking: Reported on 09/10/2019 12/31/18   [name], MD  iron polysaccharides (NU-IRON) 150 MG capsule Take 1 capsule (150 mg total) by mouth daily. 06/22/19 02/13/26  Cammie Sickle, MD  metFORMIN (GLUCOPHAGE) 1000 MG  tablet Take 1,000 mg by mouth daily with breakfast.  07/02/18   [provider]  montelukast (SINGULAIR) 10 MG tablet Take 1 tablet (10 mg total) by mouth at bedtime. Start 2 days prior to infusion. Take it for 4 days. 06/07/19   Cammie Sickle, MD  morphine (MS CONTIN) 15 MG 12 hr tablet Take 1 tablet (15 mg total) by mouth every 12 (twelve) hours. 07/27/19   Borders, Kirt Boys, NP  naloxegol oxalate (MOVANTIK) 25 MG TABS tablet Take 1 tablet (25 mg total) by mouth daily. 08/06/19   Verlon Au, NP  oxyCODONE-acetaminophen (PERCOCET/ROXICET) 5-325 MG tablet Take 1 tablet by mouth every 8 (eight) hours as needed for severe pain. 09/10/19   Borders, Kirt Boys, NP  sodium polystyrene (KAYEXALATE) 15 GM/60ML suspension Take 60 mLs (15 g total) by mouth every 6 (six) hours. Until 2 loose stools Patient not taking: Reported on 09/10/2019 06/02/19   Cammie Sickle, MD     Allergies Patient has no known allergies.  Family History  Problem Relation Age of Onset  . Hypertension Sister     Social History Social History   Tobacco Use  . Smoking status: Current Every Day Smoker    Packs/day: 0.50    Years: 47.00    Pack years: 23.50    Types: Cigarettes  . Smokeless tobacco: Never Used  Substance Use Topics  . Alcohol use: Yes    Alcohol/week: 1.0 standard drinks  Types: 1 Glasses of wine per week    Comment:  almost none in last 6 months  . Drug use: No    Review of Systems  Constitutional: No fever/chills Eyes: No visual changes.  ENT: No sore throat. Cardiovascular: Denies chest pain. Respiratory: Denies shortness of breath. Gastrointestinal: As above Genitourinary: Negative for dysuria. Musculoskeletal: Negative for back pain. Skin: Negative for rash. Neurological: Negative for headaches or weakness   ____________________________________________   PHYSICAL EXAM:  VITAL SIGNS: ED Triage Vitals  Enc Vitals Group     BP 09/12/19 1817 (!) 177/91     Pulse  Rate 09/12/19 1817 (!) 130     Resp 09/12/19 1817 20     Temp 09/12/19 1817 98 F (36.7 C)     Temp src --      SpO2 09/12/19 1817 99 %     Weight 09/12/19 1815 63.5 kg (140 lb)     Height 09/12/19 1815 1.727 m (5\' 8" )     Head Circumference --      Peak Flow --      Pain Score 09/12/19 1815 10     Pain Loc --      Pain Edu? --      Excl. in [place]? --     Constitutional: Alert and oriented. No acute distress. Pleasant and interactive Eyes: Conjunctivae are normal.  Head: Atraumatic.  Cardiovascular: Normal rate, regular rhythm. Grossly normal heart sounds.  Good peripheral circulation. Respiratory: Normal respiratory effort.  No retractions. Lungs CTAB. Gastrointestinal: Soft and nontender. No distention.  No CVA tenderness.  Reassuring exam  Musculoskeletal: No lower extremity tenderness nor edema.  Warm and well perfused Neurologic:  Normal speech and language. No gross focal neurologic deficits are appreciated.  Skin:  Skin is warm, dry and intact. No rash noted. Psychiatric: Mood and affect are normal. Speech and behavior are normal.  ____________________________________________   LABS (all labs ordered are listed, but only abnormal results are displayed)  Labs Reviewed  BASIC METABOLIC PANEL - Abnormal; Notable for the following components:      Result Value   Glucose, Bld 198 (*)    BUN 47 (*)    Creatinine, Ser 1.95 (*)    GFR calc non Af Amer 34 (*)    GFR calc Af Amer 39 (*)    All other components within normal limits  CBC - Abnormal; Notable for the following components:   RBC 2.57 (*)    Hemoglobin 8.5 (*)    HCT 28.2 (*)    MCV 109.7 (*)    RDW 17.1 (*)    Platelets 421 (*)    All other components within normal limits  HEPATIC FUNCTION PANEL  LIPASE, BLOOD  TROPONIN I (HIGH SENSITIVITY)  TROPONIN I (HIGH SENSITIVITY)   ____________________________________________  EKG   ____________________________________________  RADIOLOGY  Chest x-ray  consistent with COPD ____________________________________________   PROCEDURES  Procedure(s) performed: No  Procedures   Critical Care performed: No ____________________________________________   INITIAL IMPRESSION / ASSESSMENT AND PLAN / ED COURSE  Pertinent labs & imaging results that were available during my care of the patient were reviewed by me and considered in my medical decision making (see chart for details).  Patient well-appearing and in no acute distress.  He complains of abdominal pain which is consistent with his chronic abdominal pain, he does not describe any different symptoms today.  He reports when his abdominal pain flares up sometimes he feels short of breath.  Currently is  feeling that his breathing is normal.  No fevers or chills.  No chest pain.   We will treat with IV morphine, IV Zofran, IV fluids given his elevated BUN and creatinine ratio.  Otherwise lab work is consistent with prior levels.  I suspect patient is here primarily for pain relief.  Given no change in symptoms I doubt any benefit in imaging at this time.  Will reevaluate  After IV morphine Zofran and fluids patient feeling much better.  He is up and walking around the room.  I think he is appropriate for discharge at this time with close follow-up with his PCP.  Return precautions discussed.    ____________________________________________   FINAL CLINICAL IMPRESSION(S) / ED DIAGNOSES  Final diagnoses:  Generalized abdominal pain        Note:  This document was prepared using Dragon voice recognition software and may include unintentional dictation errors.   Lavonia Drafts, MD 09/12/19 [phone]

## 2019-09-12 NOTE — ED Notes (Signed)
Pt placed on monitor. RN Claiborne Billings informed of pt in room

## 2019-09-13 ENCOUNTER — Encounter: Payer: Self-pay | Admitting: Internal Medicine

## 2019-09-13 ENCOUNTER — Inpatient Hospital Stay (HOSPITAL_BASED_OUTPATIENT_CLINIC_OR_DEPARTMENT_OTHER): Payer: Medicare Other | Admitting: Internal Medicine

## 2019-09-13 ENCOUNTER — Inpatient Hospital Stay: Payer: Medicare Other

## 2019-09-13 ENCOUNTER — Inpatient Hospital Stay (HOSPITAL_BASED_OUTPATIENT_CLINIC_OR_DEPARTMENT_OTHER): Payer: Medicare Other | Admitting: Hospice and Palliative Medicine

## 2019-09-13 VITALS — BP 136/67 | HR 78 | Temp 96.2°F | Resp 20 | Wt 144.6 lb

## 2019-09-13 VITALS — BP 130/62 | HR 66 | Temp 97.3°F | Resp 18

## 2019-09-13 DIAGNOSIS — C9112 Chronic lymphocytic leukemia of B-cell type in relapse: Secondary | ICD-10-CM

## 2019-09-13 DIAGNOSIS — C9110 Chronic lymphocytic leukemia of B-cell type not having achieved remission: Secondary | ICD-10-CM | POA: Diagnosis not present

## 2019-09-13 DIAGNOSIS — L089 Local infection of the skin and subcutaneous tissue, unspecified: Secondary | ICD-10-CM

## 2019-09-13 DIAGNOSIS — I70219 Atherosclerosis of native arteries of extremities with intermittent claudication, unspecified extremity: Secondary | ICD-10-CM | POA: Diagnosis not present

## 2019-09-13 DIAGNOSIS — Z515 Encounter for palliative care: Secondary | ICD-10-CM

## 2019-09-13 DIAGNOSIS — G893 Neoplasm related pain (acute) (chronic): Secondary | ICD-10-CM

## 2019-09-13 DIAGNOSIS — C8300 Small cell B-cell lymphoma, unspecified site: Secondary | ICD-10-CM

## 2019-09-13 DIAGNOSIS — D631 Anemia in chronic kidney disease: Secondary | ICD-10-CM

## 2019-09-13 DIAGNOSIS — C9192 Lymphoid leukemia, unspecified, in relapse: Secondary | ICD-10-CM

## 2019-09-13 DIAGNOSIS — N183 Chronic kidney disease, stage 3 unspecified: Secondary | ICD-10-CM | POA: Diagnosis not present

## 2019-09-13 LAB — COMPREHENSIVE METABOLIC PANEL
ALT: 12 U/L (ref 0–44)
AST: 14 U/L — ABNORMAL LOW (ref 15–41)
Albumin: 3.7 g/dL (ref 3.5–5.0)
Alkaline Phosphatase: 75 U/L (ref 38–126)
Anion gap: 11 (ref 5–15)
BUN: 46 mg/dL — ABNORMAL HIGH (ref 8–23)
CO2: 24 mmol/L (ref 22–32)
Calcium: 9.2 mg/dL (ref 8.9–10.3)
Chloride: 103 mmol/L (ref 98–111)
Creatinine, Ser: 1.49 mg/dL — ABNORMAL HIGH (ref 0.61–1.24)
GFR calc Af Amer: 54 mL/min — ABNORMAL LOW (ref >60)
GFR calc non Af Amer: 47 mL/min — ABNORMAL LOW (ref >60)
Glucose, Bld: 168 mg/dL — ABNORMAL HIGH (ref 70–99)
Potassium: 5.6 mmol/L — ABNORMAL HIGH (ref 3.5–5.1)
Sodium: 138 mmol/L (ref 135–145)
Total Bilirubin: 0.5 mg/dL (ref 0.3–1.2)
Total Protein: 6.9 g/dL (ref 6.5–8.1)

## 2019-09-13 LAB — CBC WITH DIFFERENTIAL/PLATELET
Abs Immature Granulocytes: 0.02 10*3/uL (ref 0.00–0.07)
Basophils Absolute: 0 10*3/uL (ref 0.0–0.1)
Basophils Relative: 0 %
Eosinophils Absolute: 0 10*3/uL (ref 0.0–0.5)
Eosinophils Relative: 0 %
HCT: 25.2 % — ABNORMAL LOW (ref 39.0–52.0)
Hemoglobin: 7.6 g/dL — ABNORMAL LOW (ref 13.0–17.0)
Immature Granulocytes: 0 %
Lymphocytes Relative: 35 %
Lymphs Abs: 1.8 10*3/uL (ref 0.7–4.0)
MCH: 33 pg (ref 26.0–34.0)
MCHC: 30.2 g/dL (ref 30.0–36.0)
MCV: 109.6 fL — ABNORMAL HIGH (ref 80.0–100.0)
Monocytes Absolute: 0.5 10*3/uL (ref 0.1–1.0)
Monocytes Relative: 9 %
Neutro Abs: 2.8 10*3/uL (ref 1.7–7.7)
Neutrophils Relative %: 56 %
Platelets: 344 10*3/uL (ref 150–400)
RBC: 2.3 MIL/uL — ABNORMAL LOW (ref 4.22–5.81)
RDW: 17.1 % — ABNORMAL HIGH (ref 11.5–15.5)
WBC: 5.1 10*3/uL (ref 4.0–10.5)
nRBC: 0 % (ref 0.0–0.2)

## 2019-09-13 MED ORDER — SODIUM CHLORIDE 0.9 % IV SOLN
Freq: Once | INTRAVENOUS | Status: AC
  Filled 2019-09-13: qty 250

## 2019-09-13 MED ORDER — MONTELUKAST SODIUM 10 MG PO TABS
10.0000 mg | ORAL_TABLET | Freq: Once | ORAL | Status: AC
  Administered 2019-09-13: 10 mg via ORAL
  Filled 2019-09-13: qty 1

## 2019-09-13 MED ORDER — OXYCODONE-ACETAMINOPHEN 5-325 MG PO TABS: 1.0000 | ORAL_TABLET | ORAL | 0 refills | Status: DC | PRN

## 2019-09-13 MED ORDER — SODIUM CHLORIDE 0.9 % IV SOLN
20.0000 mg | Freq: Once | INTRAVENOUS | Status: AC
  Administered 2019-09-13: 20 mg via INTRAVENOUS
  Filled 2019-09-13: qty 2

## 2019-09-13 MED ORDER — DARBEPOETIN ALFA 300 MCG/0.6ML IJ SOSY
300.0000 ug | PREFILLED_SYRINGE | Freq: Once | INTRAMUSCULAR | Status: AC
  Administered 2019-09-13: 300 ug via SUBCUTANEOUS
  Filled 2019-09-13: qty 0.6

## 2019-09-13 MED ORDER — DIPHENHYDRAMINE HCL 50 MG/ML IJ SOLN
50.0000 mg | Freq: Once | INTRAMUSCULAR | Status: AC
  Administered 2019-09-13: 50 mg via INTRAVENOUS
  Filled 2019-09-13: qty 1

## 2019-09-13 MED ORDER — CEPHALEXIN 500 MG PO CAPS: 500.0000 mg | ORAL_CAPSULE | Freq: Three times a day (TID) | ORAL | 0 refills | Status: DC

## 2019-09-13 MED ORDER — MORPHINE SULFATE ER 15 MG PO TBCR: 15.0000 mg | EXTENDED_RELEASE_TABLET | Freq: Two times a day (BID) | ORAL | 0 refills | Status: DC

## 2019-09-13 MED ORDER — SODIUM CHLORIDE 0.9 % IV SOLN
1000.0000 mg | Freq: Once | INTRAVENOUS | Status: AC
  Administered 2019-09-13: 1000 mg via INTRAVENOUS
  Filled 2019-09-13: qty 40

## 2019-09-13 MED ORDER — ACETAMINOPHEN 325 MG PO TABS
650.0000 mg | ORAL_TABLET | Freq: Once | ORAL | Status: AC
  Administered 2019-09-13: 650 mg via ORAL
  Filled 2019-09-13: qty 2

## 2019-09-13 NOTE — Progress Notes (Signed)
[place] [facility] [place] OFFICE PROGRESS NOTE  Patient Care Team: [name], MD as PCP - General (Internal Medicine) Cammie Sickle, MD as Consulting Physician (Internal Medicine)  Cancer Staging No matching staging information was found for the patient.   Oncology History Overview Note  # 2006- CLL STAGE IV; MAY 2011- WBC- 57K;Platelets-99;Hb-12/CT Bulky LN; BMBx- 80% Invol; del 11; START Benda-Ritux x4 [finished Sep 2011];   # July 2015-Progression; Sep 2015-START ibrutinib; CT scan DEC 2015- Improvement LN; Cont Ibrutinib 149m/d; NOV 2016 CT- 1-2CM LN [mild progression compared to Dec 2015];NOV 2016- FISH peripheral blood- NO MUTATIONS/CD-38 Positive; NOV 7th- CONT IBRUTINIB 2 pills/day; CT AUG 2017- STABLE;  DEC 6th PET- Mild RP LN/ Retrocrural LN  # OFF ibrutinib [? intol]- sep 2019- Jan 16th 2020; Re-start Ibrutinib; September 2020-stop ibrutinib [poor tolerance/worsening anemia]  # Jan 16th 2020- start [place]anesp; [place]   # MARCH 11th 2021-[name]  # DEC 2019- PAIN CONTRACT  # PALLIATIVE CARE- 06/21/2019-  # October 2019-bone marrow biopsy [worsening anemia]-question dyserythropoietic changes/small clone of CLL;  # [facility] HEM- NEG.  SA skin infection [Oct 2016] s/p clinda -------------------------------------------------------    DIAGNOSIS: CLL  STAGE:   IV      ;GOALS: palliative  CURRENT/MOST RECENT THERAPY : Gazyva [C]   CLL (chronic lymphocytic leukemia) ([place]  07/08/2019 -  Chemotherapy   The patient had obinutuzumab (GAZYVA) 100 mg in sodium chloride 0.9 % 100 mL (0.9615 mg/mL) chemo infusion, 100 mg, Intravenous, Once, 3 of 6 cycles Administration: 100 mg (07/08/2019), 900 mg (07/09/2019), 1,000 mg (07/26/2019), 1,000 mg (08/16/2019), 1,000 mg (08/02/2019), 1,000 mg (09/13/2019)  for chemotherapy treatment.      INTERVAL HISTORY:  FELEK HOLDERNESS89y.[id number].  male pleasant patient above progressive CLL and chronic  abdominal pain of unclear etiology; anemia- ckd/hyperkalemia currently GDyann Kiefis here for follow-up.  Patient had cycle #2 Gazyva approximately 4 weeks ago.   Patient complains of worsening fatigue.  Also chronic shortness of breath.  Noted to have abdominal pain for which he was evaluated emergency room.  No abdominal imaging done.   Review of Systems  Constitutional: Positive for malaise/fatigue and weight loss. Negative for chills, diaphoresis and fever.  HENT: Negative for nosebleeds and sore throat.   Eyes: Negative for double vision.  Respiratory: Negative for cough, hemoptysis, sputum production, shortness of breath and wheezing.   Cardiovascular: Negative for chest pain, palpitations, orthopnea and leg swelling.  Gastrointestinal: Positive for abdominal pain, constipation and nausea. Negative for blood in stool, diarrhea, heartburn, melena and vomiting.  Genitourinary: Negative for dysuria, frequency and urgency.  Skin: Negative.  Negative for itching and rash.  Neurological: Negative for dizziness, tingling, focal weakness, weakness and headaches.  Endo/Heme/Allergies: Does not bruise/bleed easily.  Psychiatric/Behavioral: Negative for depression. The patient is not nervous/anxious and does not have insomnia.       PAST MEDICAL HISTORY :  Past Medical History:  Diagnosis Date  . [place]   . Constipation   . Depression   . Diabetes mellitus without complication ([place]   . Hematuria   . Hyperlipidemia   . Hypertension   . Therapeutic opioid induced constipation     PAST SURGICAL HISTORY :   Past Surgical History:  Procedure Laterality Date  . CATARACT EXTRACTION W/ INTRAOCULAR LENS IMPLANT Bilateral   . CHOLECYSTECTOMY  1983  . COLONOSCOPY WITH PROPOFOL N/A 10/27/2017   Procedure: COLONOSCOPY WITH PROPOFOL;  Surgeon:  Manya Silvas, MD;  Location[facility] ENDOSCOPY;  Service: Endoscopy;  Laterality: N/A;  . ESOPHAGOGASTRODUODENOSCOPY  (EGD) WITH PROPOFOL N/A 11/20/2016   Procedure: ESOPHAGOGASTRODUODENOSCOPY (EGD) WITH PROPOFOL;  Surgeon: Manya Silvas, MD;  Location: [facility] ENDOSCOPY;  Service: Endoscopy;  Laterality: N/A;  . ESOPHAGOGASTRODUODENOSCOPY (EGD) WITH PROPOFOL N/A 10/27/2017   Procedure: ESOPHAGOGASTRODUODENOSCOPY (EGD) WITH PROPOFOL;  Surgeon: Manya Silvas, MD;  Location: [facility] ENDOSCOPY;  Service: Endoscopy;  Laterality: N/A;  . LOWER EXTREMITY ANGIOGRAPHY Right 05/19/2017   Procedure: LOWER EXTREMITY ANGIOGRAPHY;  Surgeon: Algernon Huxley, MD;  Location: [place] CV LAB;  Service: Cardiovascular;  Laterality: Right;  . LOWER EXTREMITY ANGIOGRAPHY Left 08/10/2018   Procedure: LOWER EXTREMITY ANGIOGRAPHY;  Surgeon: Algernon Huxley, MD;  Location: [place] CV LAB;  Service: Cardiovascular;  Laterality: Left;    FAMILY HISTORY :   Family History  Problem Relation Age of Onset  . Hypertension Sister     SOCIAL HISTORY:   Social History   Tobacco Use  . Smoking status: Current Every Day Smoker    Packs/day: 0.50    Years: 47.00    Pack years: 23.50    Types: Cigarettes  . Smokeless tobacco: Never Used  Substance Use Topics  . Alcohol use: Yes    Alcohol/week: 1.0 standard drinks    Types: 1 Glasses of wine per week    Comment:  almost none in last 6 months  . Drug use: No    ALLERGIES:  has No Known Allergies.  MEDICATIONS:  Current Outpatient Medications  Medication Sig Dispense Refill  . acyclovir (ZOVIRAX) 400 MG tablet One pill a day [to prevent shingles] 30 tablet 3  . aspirin EC 81 MG tablet Take 81 mg by mouth daily.    Marland Kitchen atorvastatin (LIPITOR) 10 MG tablet Take 1 tablet (10 mg total) by mouth daily. 30 tablet 11  . Calcium Carb-Cholecalciferol (CALCIUM PLUS D3 ABSORBABLE) 9524951539 MG-UNIT CAPS Take 1 capsule by mouth 2 (two) times daily with a meal. 60 capsule 5  . Cholecalciferol (VITAMIN D3) 125 MCG (5000 UT) CAPS Take 1 capsule (5,000 Units total) by mouth daily with breakfast.  Take along with calcium and magnesium. 30 capsule 5  . clopidogrel (PLAVIX) 75 MG tablet Take 1 tablet by mouth once daily 90 tablet 0  . dexamethasone (DECADRON) 4 MG tablet Start 2 days prior to infusion; Take for 2 days. Do not take on the day of infusion. 60 tablet 3  . Ensure (ENSURE) Take 237 mLs by mouth.    . iron polysaccharides (NU-IRON) 150 MG capsule Take 1 capsule (150 mg total) by mouth daily. 30 capsule 1  . metFORMIN (GLUCOPHAGE) 1000 MG tablet Take 1,000 mg by mouth daily with breakfast.     . montelukast (SINGULAIR) 10 MG tablet Take 1 tablet (10 mg total) by mouth at bedtime. Start 2 days prior to infusion. Take it for 4 days. 60 tablet 0  . naloxegol oxalate (MOVANTIK) 25 MG TABS tablet Take 1 tablet (25 mg total) by mouth daily. 30 tablet 0  . cephALEXin (KEFLEX) 500 MG capsule Take 1 capsule (500 mg total) by mouth 3 (three) times daily. 30 capsule 0  . folic acid (FOLVITE) 1 MG tablet Take 1 tablet (1 mg total) by mouth daily. (Patient not taking: Reported on 09/10/2019) 30 tablet 1  . morphine (MS CONTIN) 15 MG 12 hr tablet Take 1 tablet (15 mg total) by mouth every 12 (twelve) hours. 30 tablet 0  . oxyCODONE-acetaminophen (  PERCOCET/ROXICET) 5-325 MG tablet Take 1 tablet by mouth every 4 (four) hours as needed for severe pain. 90 tablet 0  . sodium polystyrene (KAYEXALATE) 15 GM/60ML suspension Take 60 mLs (15 g total) by mouth every 6 (six) hours. Until 2 loose stools (Patient not taking: Reported on 09/10/2019) 240 mL 0   No current facility-administered medications for this visit.    PHYSICAL EXAMINATION: ECOG PERFORMANCE STATUS: 1 - Symptomatic but completely ambulatory  BP 136/67 (BP Location: Left Arm, Patient Position: Sitting, Cuff Size: Normal)   Pulse 78   Temp (!) 96.2 F (35.7 C) (Tympanic)   Resp 20   Wt 144 lb 9.6 oz (65.6 kg)   SpO2 100%   BMI 21.99 kg/m   Filed Weights   09/13/19 0912  Weight: 144 lb 9.6 oz (65.6 kg)    Physical Exam   Constitutional: He is oriented to person, place, and time and well-developed, well-nourished, and in no distress.  He is alone.  Appears pale.  HENT:  Head: Normocephalic and atraumatic.  Mouth/Throat: Oropharynx is clear and moist. No oropharyngeal exudate.  Eyes: Pupils are equal, round, and reactive to light.  Cardiovascular: Normal rate and regular rhythm.  Pulmonary/Chest: No respiratory distress. He has no wheezes.  Decreased air entry bil.   Abdominal: Soft. Bowel sounds are normal. He exhibits no distension and no mass. There is no abdominal tenderness. There is no rebound and no guarding.  Musculoskeletal:        General: No tenderness or edema. Normal range of motion.     Cervical back: Normal range of motion and neck supple.  Neurological: He is alert and oriented to person, place, and time.  Skin: Skin is warm. There is pallor.  Multiple bruises noted in bilateral upper extremity.  Psychiatric: Affect normal.      LABORATORY DATA:  I have reviewed the data as listed    Component Value Date/Time   NA 138 09/13/2019 0822   NA 142 05/03/2014 1139   K 5.6 (H) 09/13/2019 0822   K 4.7 05/03/2014 1139   CL 103 09/13/2019 0822   CL 110 (H) 05/03/2014 1139   CO2 24 09/13/2019 0822   CO2 24 05/03/2014 1139   GLUCOSE 168 (H) 09/13/2019 0822   GLUCOSE 98 05/03/2014 1139   BUN 46 (H) 09/13/2019 0822   BUN 20 (H) 05/03/2014 1139   CREATININE 1.49 (H) 09/13/2019 0822   CREATININE 1.22 08/09/2014 1122   CALCIUM 9.2 09/13/2019 0822   CALCIUM 8.6 05/03/2014 1139   PROT 6.9 09/13/2019 0822   PROT 7.5 05/03/2014 1139   ALBUMIN 3.7 09/13/2019 0822   ALBUMIN 4.1 05/03/2014 1139   AST 14 (L) 09/13/2019 0822   AST 15 05/03/2014 1139   ALT 12 09/13/2019 0822   ALT 26 05/03/2014 1139   ALKPHOS 75 09/13/2019 0822   ALKPHOS 94 05/03/2014 1139   BILITOT 0.5 09/13/2019 0822   BILITOT 0.5 05/03/2014 1139   GFRNONAA 47 (L) 09/13/2019 0822   GFRNONAA >60 08/09/2014 1122   GFRAA 54  (L) 09/13/2019 0822   GFRAA >60 08/09/2014 1122    No results found for: SPEP, UPEP  Lab Results  Component Value Date   WBC 5.1 09/13/2019   NEUTROABS 2.8 09/13/2019   HGB 7.6 (L) 09/13/2019   HCT 25.2 (L) 09/13/2019   MCV 109.6 (H) 09/13/2019   PLT 344 09/13/2019      Chemistry      Component Value Date/Time   NA 138 09/13/2019  0349   NA 142 05/03/2014 1139   K 5.6 (H) 09/13/2019 0822   K 4.7 05/03/2014 1139   CL 103 09/13/2019 0822   CL 110 (H) 05/03/2014 1139   CO2 24 09/13/2019 0822   CO2 24 05/03/2014 1139   BUN 46 (H) 09/13/2019 0822   BUN 20 (H) 05/03/2014 1139   CREATININE 1.49 (H) 09/13/2019 0822   CREATININE 1.22 08/09/2014 1122      Component Value Date/Time   CALCIUM 9.2 09/13/2019 0822   CALCIUM 8.6 05/03/2014 1139   ALKPHOS 75 09/13/2019 0822   ALKPHOS 94 05/03/2014 1139   AST 14 (L) 09/13/2019 0822   AST 15 05/03/2014 1139   ALT 12 09/13/2019 0822   ALT 26 05/03/2014 1139   BILITOT 0.5 09/13/2019 0822   BILITOT 0.5 05/03/2014 1139       RADIOGRAPHIC STUDIES: I have personally reviewed the radiological images as listed and agreed with the findings in the report. No results found.   ASSESSMENT & PLAN:  CLL (chronic lymphocytic leukemia) ([place]) # CLL/SLL- relapsed most recently on ibrutinib; FEB 2021- CT Ab/Pelvis-CT scan chest and pelvis shows significant progression of axillary/mediastinal/abdominal/pelvic-however the largest lesion approximately inch in size. MARCH 2021- ~ 2.5 cm RP LN; multiple smaller LN.  Currently on Gazyva.  #Proceed with Gazyva #3; today. Labs today reviewed;  acceptable for treatment today except for anemia hb-7.8.   #Chronic abdominal pain-unclear etiology-stable patient's urine porphyrins were slightly abnormal ; awaiting plasma fractionated porphyrin work-up; continue narcotics/follow-up with pain management.  Discussed with Josh.  #Hyperkalemia/CKD-III-secondary renal insufficiency/creatinine 1.3- [baseline 1.4;  Dr.Lateef].;  Overall stable  # Anemia-hemoglobin-worse than baseline.  7.8.-Secondary to CKD versus progressive leukemia; monitor for now; proceed with PRBC transfusion next week/patient preference proceed with Aranesp today.  #Right pinna/left toe infection-recommend Keflex.  # DISPOSITION:  # Aranesp; Gazyva; today # in 1 week-; cbc;hold tube; 1 unit PRBC transfusion.  # referral to Podiatry re: left foot infection # 4 week- MD; labs- cbc/cmpLDH; HOLD tube;possible aranesp; Gazyva-Dr.B        Orders Placed This Encounter  Procedures  . CBC with Differential    Standing Status:   Future    Standing Expiration Date:   09/12/2020  . CBC with Differential    Standing Status:   Future    Standing Expiration Date:   09/12/2020  . Comprehensive metabolic panel    Standing Status:   Future    Standing Expiration Date:   09/12/2020  . Lactate dehydrogenase    Standing Status:   Future    Standing Expiration Date:   09/12/2020  . Ambulatory referral to Podiatry    Referral Priority:   Routine    Referral Type:   Consultation    Referral Reason:   Specialty Services Required    Referred to Provider:   Edrick Kins, DPM    Requested Specialty:   Podiatry    Number of Visits Requested:   1  . Hold Tube- Blood Bank    Standing Status:   Future    Standing Expiration Date:   09/12/2020  . Hold Tube- Blood Bank    Standing Status:   Future    Standing Expiration Date:   09/12/2020   All questions were answered. The patient knows to call the clinic with any problems, questions or concerns.      Cammie Sickle, MD 09/15/2019 8:27 AM

## 2019-09-13 NOTE — Progress Notes (Signed)
Patient here today for follow up. States he went to ER last night because he was having dizziness, shortness of breath, and some non healing wounds on right ear and left pinky toe. He states he was told his wounds were not infected and sent home. He would like to discuss with provider. Wounds duration 3 months.

## 2019-09-13 NOTE — Progress Notes (Signed)
Pt tolerated infusion well. No s/s of distress noted. Pt and VS stable at discharge.  

## 2019-09-13 NOTE — Progress Notes (Signed)
[place]  [phone] [phone]   Name: James [name] Date: 09/13/2019 MRN: [id number]  DOB: 1950/06/12  Patient Care Team: [name], MD as PCP - General (Internal Medicine) Cammie Sickle, MD as Consulting Physician (Internal Medicine)    REASON FOR CONSULTATION: James [name] is a 69 y.o. male with multiple medical problems including stage IV CLL (initially diagnosed in 2006) most recently treated with ibrutinib but was discontinued due to poor tolerance. He is now on treatment with Gazyva.  Patient has had severe and chronic abdominal pain of unclear etiology.  He has had extensive work-up including referral to [facility].  He has been followed by interventional management and is status post celiac plexus blocks without significant relief in symptoms.  Patient has continued to lose weight.  He was referred to palliative care to help address goals and manage ongoing symptoms..   SOCIAL HISTORY:     reports that he has been smoking cigarettes. He has a 23.50 pack-year smoking history. He has never used smokeless tobacco. He reports current alcohol use of about 1.0 standard drinks of alcohol per week. He reports that he does not use drugs.   Patient is married and lives at home with his wife.  He has a son in [place].  Patient formally worked as a Administrator.  ADVANCE DIRECTIVES:  Does not have  CODE STATUS: DNR/DNI (MOST form completed on 07/08/19  PAST MEDICAL HISTORY: Past Medical History:  Diagnosis Date  . CLL (chronic lymphocytic leukemia) ([place])   . Constipation   . Depression   . Diabetes mellitus without complication ([place])   . Hematuria   . Hyperlipidemia   . Hypertension   . Therapeutic opioid induced constipation     PAST SURGICAL HISTORY:  Past Surgical History:  Procedure Laterality Date  . CATARACT EXTRACTION W/ INTRAOCULAR LENS IMPLANT Bilateral   . CHOLECYSTECTOMY  1983   . COLONOSCOPY WITH PROPOFOL N/A 10/27/2017   Procedure: COLONOSCOPY WITH PROPOFOL;  Surgeon: Manya Silvas, MD;  Location: [facility] ENDOSCOPY;  Service: Endoscopy;  Laterality: N/A;  . ESOPHAGOGASTRODUODENOSCOPY (EGD) WITH PROPOFOL N/A 11/20/2016   Procedure: ESOPHAGOGASTRODUODENOSCOPY (EGD) WITH PROPOFOL;  Surgeon: Manya Silvas, MD;  Location: [facility] ENDOSCOPY;  Service: Endoscopy;  Laterality: N/A;  . ESOPHAGOGASTRODUODENOSCOPY (EGD) WITH PROPOFOL N/A 10/27/2017   Procedure: ESOPHAGOGASTRODUODENOSCOPY (EGD) WITH PROPOFOL;  Surgeon: Manya Silvas, MD;  Location: [facility] ENDOSCOPY;  Service: Endoscopy;  Laterality: N/A;  . LOWER EXTREMITY ANGIOGRAPHY Right 05/19/2017   Procedure: LOWER EXTREMITY ANGIOGRAPHY;  Surgeon: Algernon Huxley, MD;  Location: [place] CV LAB;  Service: Cardiovascular;  Laterality: Right;  . LOWER EXTREMITY ANGIOGRAPHY Left 08/10/2018   Procedure: LOWER EXTREMITY ANGIOGRAPHY;  Surgeon: Algernon Huxley, MD;  Location: [place] CV LAB;  Service: Cardiovascular;  Laterality: Left;    HEMATOLOGY/ONCOLOGY HISTORY:  Oncology History Overview Note  # 2006- CLL STAGE IV; MAY 2011- WBC- 57K;Platelets-99;Hb-12/CT Bulky LN; BMBx- 80% Invol; del 11; START Benda-Ritux x4 [finished Sep 2011];   # July 2015-Progression; Sep 2015-START ibrutinib; CT scan DEC 2015- Improvement LN; Cont Ibrutinib 141m/d; NOV 2016 CT- 1-2CM LN [mild progression compared to Dec 2015];NOV 2016- FISH peripheral blood- NO MUTATIONS/CD-38 Positive; NOV 7th- CONT IBRUTINIB 2 pills/day; CT AUG 2017- STABLE;  DEC 6th PET- Mild RP LN/ Retrocrural LN  # OFF ibrutinib [? intol]- sep 2019- Jan 16th 2020; Re-start Ibrutinib; September 2020-stop ibrutinib [poor tolerance/worsening anemia]  # Jan 16th 2020- start  aranesp; HOLD while on [place].   # MARCH 11th 2021[name]   # DEC 2019- PAIN CONTRACT  # PALLIATIVE CARE- 06/21/2019-  # October 2019-bone marrow biopsy [worsening anemia]-question dyserythropoietic  changes/small clone of CLL;  # [facility] H[facility]- NEG.  SA skin infection [Oct 2016] s/p clinda -------------------------------------------------------    DIAGNOSIS: CLL  STAGE:   IV      ;GOALS: palliative  CURRENT/MOST RECENT THERAPY : Gazyva [C]   CLL (chronic lymphocytic leukemia) ([place])  07/08/2019 -  Chemotherapy   The patient had obinutuzumab (GAZYVA) 100 mg in sodium chloride 0.9 % 100 mL (0.9615 mg/mL) chemo infusion, 100 mg, Intravenous, Once, 2 of 6 cycles Administration: 100 mg (07/08/2019), 900 mg (07/09/2019), 1,000 mg (07/26/2019), 1,000 mg (08/16/2019), 1,000 mg (08/02/2019)  for chemotherapy treatment.      ALLERGIES:  has No Known Allergies.  MEDICATIONS:  Current Outpatient Medications  Medication Sig Dispense Refill  . acyclovir (ZOVIRAX) 400 MG tablet One pill a day [to prevent shingles] 30 tablet 3  . aspirin EC 81 MG tablet Take 81 mg by mouth daily.    Marland Kitchen atorvastatin (LIPITOR) 10 MG tablet Take 1 tablet (10 mg total) by mouth daily. 30 tablet 11  . Calcium Carb-Cholecalciferol (CALCIUM PLUS D3 ABSORBABLE) [phone] MG-UNIT CAPS Take 1 capsule by mouth 2 (two) times daily with a meal. 60 capsule 5  . cephALEXin (KEFLEX) 500 MG capsule Take 1 capsule (500 mg total) by mouth 3 (three) times daily. 30 capsule 0  . Cholecalciferol (VITAMIN D3) 125 MCG (5000 UT) CAPS Take 1 capsule (5,000 Units total) by mouth daily with breakfast. Take along with calcium and magnesium. 30 capsule 5  . clopidogrel (PLAVIX) 75 MG tablet Take 1 tablet by mouth once daily 90 tablet 0  . dexamethasone (DECADRON) 4 MG tablet Start 2 days prior to infusion; Take for 2 days. Do not take on the day of infusion. 60 tablet 3  . Ensure (ENSURE) Take 237 mLs by mouth.    . folic acid (FOLVITE) 1 MG tablet Take 1 tablet (1 mg total) by mouth daily. (Patient not taking: Reported on 09/10/2019) 30 tablet 1  . iron polysaccharides (NU-IRON) 150 MG capsule Take 1 capsule (150 mg total) by mouth daily. 30  capsule 1  . metFORMIN (GLUCOPHAGE) 1000 MG tablet Take 1,000 mg by mouth daily with breakfast.     . montelukast (SINGULAIR) 10 MG tablet Take 1 tablet (10 mg total) by mouth at bedtime. Start 2 days prior to infusion. Take it for 4 days. 60 tablet 0  . morphine (MS CONTIN) 15 MG 12 hr tablet Take 1 tablet (15 mg total) by mouth every 12 (twelve) hours. 30 tablet 0  . naloxegol oxalate (MOVANTIK) 25 MG TABS tablet Take 1 tablet (25 mg total) by mouth daily. 30 tablet 0  . oxyCODONE-acetaminophen (PERCOCET/ROXICET) 5-325 MG tablet Take 1 tablet by mouth every 8 (eight) hours as needed for severe pain. 90 tablet 0  . sodium polystyrene (KAYEXALATE) 15 GM/60ML suspension Take 60 mLs (15 g total) by mouth every 6 (six) hours. Until 2 loose stools (Patient not taking: Reported on 09/10/2019) 240 mL 0   No current facility-administered medications for this visit.   Facility-Administered Medications Ordered in Other Visits  Medication Dose Route Frequency Provider Last Rate Last Admin  . Darbepoetin Alfa (ARANESP) injection 300 mcg  300 mcg Subcutaneous Once Cammie Sickle, MD        VITAL SIGNS: There were no vitals  taken for this visit. There were no vitals filed for this visit.  Estimated body mass index is 21.99 kg/m as calculated from the following:   Height as of 09/12/19: 5' 8"  (1.727 m).   Weight as of an earlier encounter on 09/13/19: 144 lb 9.6 oz (65.6 kg).  LABS: CBC:    Component Value Date/Time   WBC 5.1 09/13/2019 0822   HGB 7.6 (L) 09/13/2019 0822   HGB 11.4 (L) 08/09/2014 1122   HCT 25.2 (L) 09/13/2019 0822   HCT 35.6 (L) 08/09/2014 1122   PLT 344 09/13/2019 0822   PLT 95 (L) 08/09/2014 1122   MCV 109.6 (H) 09/13/2019 0822   MCV 100 08/09/2014 1122   NEUTROABS 2.8 09/13/2019 0822   NEUTROABS 4.3 08/09/2014 1122   LYMPHSABS 1.8 09/13/2019 0822   LYMPHSABS 6.5 (H) 08/09/2014 1122   MONOABS 0.5 09/13/2019 0822   MONOABS 1.1 (H) 08/09/2014 1122   EOSABS 0.0  09/13/2019 0822   EOSABS 0.1 08/09/2014 1122   BASOSABS 0.0 09/13/2019 0822   BASOSABS 0.1 08/09/2014 1122   Comprehensive Metabolic Panel:    Component Value Date/Time   NA 138 09/13/2019 0822   NA 142 05/03/2014 1139   K 5.6 (H) 09/13/2019 0822   K 4.7 05/03/2014 1139   CL 103 09/13/2019 0822   CL 110 (H) 05/03/2014 1139   CO2 24 09/13/2019 0822   CO2 24 05/03/2014 1139   BUN 46 (H) 09/13/2019 0822   BUN 20 (H) 05/03/2014 1139   CREATININE 1.49 (H) 09/13/2019 0822   CREATININE 1.22 08/09/2014 1122   GLUCOSE 168 (H) 09/13/2019 0822   GLUCOSE 98 05/03/2014 1139   CALCIUM 9.2 09/13/2019 0822   CALCIUM 8.6 05/03/2014 1139   AST 14 (L) 09/13/2019 0822   AST 15 05/03/2014 1139   ALT 12 09/13/2019 0822   ALT 26 05/03/2014 1139   ALKPHOS 75 09/13/2019 0822   ALKPHOS 94 05/03/2014 1139   BILITOT 0.5 09/13/2019 0822   BILITOT 0.5 05/03/2014 1139   PROT 6.9 09/13/2019 0822   PROT 7.5 05/03/2014 1139   ALBUMIN 3.7 09/13/2019 0822   ALBUMIN 4.1 05/03/2014 1139    RADIOGRAPHIC STUDIES: DG Chest 2 View  Result Date: 09/12/2019 CLINICAL DATA:  Shortness of breath, chronic abdominal symptoms EXAM: CHEST - 2 VIEW COMPARISON:  CT chest 06/18/2019, radiograph 12/28/2018 FINDINGS: Hyperinflation with flattening of the diaphragms and some coarsened basilar interstitial changes with apical lucency compatible with emphysematous features on comparison CT. No consolidation, features of edema, pneumothorax, or effusion. The aorta is calcified. The remaining cardiomediastinal contours are unremarkable. No acute osseous or soft tissue abnormality. Degenerative changes are present in the imaged spine and shoulders. Surgical clips present within the upper abdomen. IMPRESSION: 1. Findings consistent with emphysema/COPD. 2. No acute cardiopulmonary abnormality. Electronically Signed   By: Lovena Le M.D.   On: 09/12/2019 18:51    PERFORMANCE STATUS (ECOG) : 1 - Symptomatic but completely  ambulatory  Review of Systems Unless otherwise noted, a complete review of systems is negative.  Physical Exam General: NAD Pulmonary: Unlabored Abdomen: soft, nontender, + bowel sounds GU: no suprapubic tenderness Extremities: no edema, no joint deformities Skin: no rashes Neurological: Weakness but otherwise nonfocal  IMPRESSION: Patient was an add-on to my schedule today due to pain.  He was seen in the ER last night with complaints of his chronic abdominal pain.  He was sent home after IV morphine and Zofran.  Work-up was unrevealing.  Patient appears to be  at baseline today.  He denies any nausea or vomiting.  He is having regular bowel movements.  He currently rates pain in the abdomen is 5 out of 10.  He says that at most, it reaches 7 out of 10.  He does report some relief from use of the Percocet but admits that sometimes he has to take 2 and is taking more frequently than ordered to control his pain.  He was previously started on MS Contin but says that he was not taking that regularly and has not had a prescription filled since March.  Patient is requesting a refill of the MS Contin today.  Note the patient last saw Dr. Dossie Arbour in March at which time there was discussion for trial of an intrathecal pain pump.  It appears that patient was lost to follow-up.  I reached out to Dr. Adalberto Cole office and requested the patient be rescheduled for evaluation.  PLAN: -Continue current scope of treatment -Restart MS Contin 15 mg every 12 hours (#30) -Increase Percocet 5 325 mg every 4 hours as needed for breakthrough pain -Prophylactic bowel regimen -Follow-up with Dr. Dossie Arbour -RTC in 2 to 3 weeks   Patient expressed understanding and was in agreement with this plan. He also understands that He can call the clinic at any time with any questions, concerns, or complaints.     Time Total: 30 minutes  Visit consisted of counseling and education dealing with the complex and emotionally  intense issues of symptom management and palliative care in the setting of serious and potentially life-threatening illness.Greater than 50%  of this time was spent counseling and coordinating care related to the above assessment and plan.  Signed by: Altha Harm, PhD, NP-C

## 2019-09-13 NOTE — Assessment & Plan Note (Addendum)
#   CLL/SLL- relapsed most recently on ibrutinib; FEB 2021- CT Ab/Pelvis-CT scan chest and pelvis shows significant progression of axillary/mediastinal/abdominal/pelvic-however the largest lesion approximately inch in size. MARCH 2021- ~ 2.5 cm RP LN; multiple smaller LN.  Currently on Gazyva.  #Proceed with Gazyva #3; today. Labs today reviewed;  acceptable for treatment today except for anemia hb-7.8.   #Chronic abdominal pain-unclear etiology-stable patient's urine porphyrins were slightly abnormal ; awaiting plasma fractionated porphyrin work-up; continue narcotics/follow-up with pain management.  Discussed with Josh.  #Hyperkalemia/CKD-III-secondary renal insufficiency/creatinine 1.3- [baseline 1.4; Dr.Lateef].;  Overall stable  # Anemia-hemoglobin-worse than baseline.  7.8.-Secondary to CKD versus progressive leukemia; monitor for now; proceed with PRBC transfusion next week/patient preference proceed with Aranesp today.  #Right pinna/left toe infection-recommend Keflex.  # DISPOSITION:  # Aranesp; Gazyva; today # in 1 week-; cbc;hold tube; 1 unit PRBC transfusion.  # referral to Podiatry re: left foot infection # 4 week- MD; labs- cbc/cmpLDH; HOLD tube;possible aranesp; Gazyva-Dr.B

## 2019-09-14 LAB — SAMPLE TO BLOOD BANK

## 2019-09-20 ENCOUNTER — Inpatient Hospital Stay: Payer: Medicare Other

## 2019-09-20 ENCOUNTER — Other Ambulatory Visit: Payer: Self-pay

## 2019-09-20 DIAGNOSIS — C9110 Chronic lymphocytic leukemia of B-cell type not having achieved remission: Secondary | ICD-10-CM

## 2019-09-20 DIAGNOSIS — N183 Chronic kidney disease, stage 3 unspecified: Secondary | ICD-10-CM | POA: Diagnosis not present

## 2019-09-20 LAB — CBC WITH DIFFERENTIAL/PLATELET
Abs Immature Granulocytes: 0.03 10*3/uL (ref 0.00–0.07)
Basophils Absolute: 0 10*3/uL (ref 0.0–0.1)
Basophils Relative: 0 %
Eosinophils Absolute: 0 10*3/uL (ref 0.0–0.5)
Eosinophils Relative: 0 %
HCT: 28.2 % — ABNORMAL LOW (ref 39.0–52.0)
Hemoglobin: 8.6 g/dL — ABNORMAL LOW (ref 13.0–17.0)
Immature Granulocytes: 1 %
Lymphocytes Relative: 32 %
Lymphs Abs: 2 10*3/uL (ref 0.7–4.0)
MCH: 33.5 pg (ref 26.0–34.0)
MCHC: 30.5 g/dL (ref 30.0–36.0)
MCV: 109.7 fL — ABNORMAL HIGH (ref 80.0–100.0)
Monocytes Absolute: 0.5 10*3/uL (ref 0.1–1.0)
Monocytes Relative: 8 %
Neutro Abs: 3.8 10*3/uL (ref 1.7–7.7)
Neutrophils Relative %: 59 %
Platelets: 214 10*3/uL (ref 150–400)
RBC: 2.57 MIL/uL — ABNORMAL LOW (ref 4.22–5.81)
RDW: 17.9 % — ABNORMAL HIGH (ref 11.5–15.5)
WBC: 6.4 10*3/uL (ref 4.0–10.5)
nRBC: 0 % (ref 0.0–0.2)

## 2019-09-20 LAB — SAMPLE TO BLOOD BANK

## 2019-09-21 ENCOUNTER — Inpatient Hospital Stay (HOSPITAL_BASED_OUTPATIENT_CLINIC_OR_DEPARTMENT_OTHER): Payer: Medicare Other | Admitting: Hospice and Palliative Medicine

## 2019-09-21 ENCOUNTER — Ambulatory Visit: Payer: Medicare Other

## 2019-09-21 ENCOUNTER — Telehealth: Payer: Self-pay | Admitting: *Deleted

## 2019-09-21 ENCOUNTER — Other Ambulatory Visit: Payer: Self-pay | Admitting: *Deleted

## 2019-09-21 VITALS — BP 131/79 | HR 81 | Temp 96.9°F | Resp 16

## 2019-09-21 DIAGNOSIS — H6191 Disorder of right external ear, unspecified: Secondary | ICD-10-CM | POA: Diagnosis not present

## 2019-09-21 DIAGNOSIS — C9112 Chronic lymphocytic leukemia of B-cell type in relapse: Secondary | ICD-10-CM

## 2019-09-21 DIAGNOSIS — I70219 Atherosclerosis of native arteries of extremities with intermittent claudication, unspecified extremity: Secondary | ICD-10-CM

## 2019-09-21 DIAGNOSIS — C9192 Lymphoid leukemia, unspecified, in relapse: Secondary | ICD-10-CM

## 2019-09-21 DIAGNOSIS — L089 Local infection of the skin and subcutaneous tissue, unspecified: Secondary | ICD-10-CM

## 2019-09-21 DIAGNOSIS — N183 Chronic kidney disease, stage 3 unspecified: Secondary | ICD-10-CM | POA: Diagnosis not present

## 2019-09-21 LAB — CBC WITH DIFFERENTIAL/PLATELET
Abs Immature Granulocytes: 0.03 10*3/uL (ref 0.00–0.07)
Basophils Absolute: 0.1 10*3/uL (ref 0.0–0.1)
Basophils Relative: 1 %
Eosinophils Absolute: 0 10*3/uL (ref 0.0–0.5)
Eosinophils Relative: 1 %
HCT: 28.5 % — ABNORMAL LOW (ref 39.0–52.0)
Hemoglobin: 8.7 g/dL — ABNORMAL LOW (ref 13.0–17.0)
Immature Granulocytes: 0 %
Lymphocytes Relative: 30 %
Lymphs Abs: 2.2 10*3/uL (ref 0.7–4.0)
MCH: 33.2 pg (ref 26.0–34.0)
MCHC: 30.5 g/dL (ref 30.0–36.0)
MCV: 108.8 fL — ABNORMAL HIGH (ref 80.0–100.0)
Monocytes Absolute: 0.9 10*3/uL (ref 0.1–1.0)
Monocytes Relative: 13 %
Neutro Abs: 4 10*3/uL (ref 1.7–7.7)
Neutrophils Relative %: 55 %
Platelets: 196 10*3/uL (ref 150–400)
RBC: 2.62 MIL/uL — ABNORMAL LOW (ref 4.22–5.81)
RDW: 17.8 % — ABNORMAL HIGH (ref 11.5–15.5)
WBC: 7.3 10*3/uL (ref 4.0–10.5)
nRBC: 0 % (ref 0.0–0.2)

## 2019-09-21 LAB — BASIC METABOLIC PANEL
Anion gap: 10 (ref 5–15)
BUN: 35 mg/dL — ABNORMAL HIGH (ref 8–23)
CO2: 26 mmol/L (ref 22–32)
Calcium: 8.9 mg/dL (ref 8.9–10.3)
Chloride: 102 mmol/L (ref 98–111)
Creatinine, Ser: 1.4 mg/dL — ABNORMAL HIGH (ref 0.61–1.24)
GFR calc Af Amer: 59 mL/min — ABNORMAL LOW (ref >60)
GFR calc non Af Amer: 51 mL/min — ABNORMAL LOW (ref >60)
Glucose, Bld: 164 mg/dL — ABNORMAL HIGH (ref 70–99)
Potassium: 4.5 mmol/L (ref 3.5–5.1)
Sodium: 138 mmol/L (ref 135–145)

## 2019-09-21 LAB — SAMPLE TO BLOOD BANK

## 2019-09-21 NOTE — Addendum Note (Signed)
Addended by: Livia Snellen on: 09/21/2019 03:17 PM   Modules accepted: Orders

## 2019-09-21 NOTE — Progress Notes (Signed)
Symptom Management [place]  [phone] [phone]  Patient Care Team: [name], MD as PCP - General (Internal Medicine) Cammie Sickle, MD as Consulting Physician (Internal Medicine)   Name of the patient: James [name]  [id number]  1951/04/29   Date of visit: 09/21/19  Reason for Consult: NIKHOLAS GEFFRE is a 69 y.o. male with multiple medical problems including stage IV CLL (initially diagnosed in 2006) most recently treated with ibrutinib but was discontinued due to poor tolerance. He is now on treatment with Gazyva.  Patient was seen by Dr. Rogue Bussing on 09/13/2019 with a lesion on the left toe and right ear.  He was started on Keflex 500 mg 3 times daily for 10 days.  Patient was referred to podiatry at [facility] but this appointment has not yet occurred. Patient presents to the clinic today for re-evaluation of his toe lesion.  Patient says that his toe lesion is neither worse nor better.  It is sore when touched or inside his shoe.  Patient's ear lesion is also stable.  Both lesions have been present for several months per patient.  Denies any neurologic complaints. Denies recent fevers or illnesses. Denies any easy bleeding or bruising. Reports good appetite and denies weight loss. Denies chest pain. Denies any nausea, vomiting, constipation, or diarrhea. Denies urinary complaints. Patient offers no further specific complaints today.  PAST MEDICAL HISTORY: Past Medical History:  Diagnosis Date  . CLL (chronic lymphocytic leukemia) ([place])   . Constipation   . Depression   . Diabetes mellitus without complication ([place])   . Hematuria   . Hyperlipidemia   . Hypertension   . Therapeutic opioid induced constipation     PAST SURGICAL HISTORY:  Past Surgical History:  Procedure Laterality Date  . CATARACT EXTRACTION W/ INTRAOCULAR LENS IMPLANT Bilateral   . CHOLECYSTECTOMY  1983  . COLONOSCOPY WITH PROPOFOL  N/A 10/27/2017   Procedure: COLONOSCOPY WITH PROPOFOL;  Surgeon: Manya Silvas, MD;  Location: [facility] ENDOSCOPY;  Service: Endoscopy;  Laterality: N/A;  . ESOPHAGOGASTRODUODENOSCOPY (EGD) WITH PROPOFOL N/A 11/20/2016   Procedure: ESOPHAGOGASTRODUODENOSCOPY (EGD) WITH PROPOFOL;  Surgeon: Manya Silvas, MD;  Location: [facility] ENDOSCOPY;  Service: Endoscopy;  Laterality: N/A;  . ESOPHAGOGASTRODUODENOSCOPY (EGD) WITH PROPOFOL N/A 10/27/2017   Procedure: ESOPHAGOGASTRODUODENOSCOPY (EGD) WITH PROPOFOL;  Surgeon: Manya Silvas, MD;  Location: [facility] ENDOSCOPY;  Service: Endoscopy;  Laterality: N/A;  . LOWER EXTREMITY ANGIOGRAPHY Right 05/19/2017   Procedure: LOWER EXTREMITY ANGIOGRAPHY;  Surgeon: Algernon Huxley, MD;  Location: [place] CV LAB;  Service: Cardiovascular;  Laterality: Right;  . LOWER EXTREMITY ANGIOGRAPHY Left 08/10/2018   Procedure: LOWER EXTREMITY ANGIOGRAPHY;  Surgeon: Algernon Huxley, MD;  Location: [place] CV LAB;  Service: Cardiovascular;  Laterality: Left;    HEMATOLOGY/ONCOLOGY HISTORY:  Oncology History Overview Note  # 2006- CLL STAGE IV; MAY 2011- WBC- 57K;Platelets-99;Hb-12/CT Bulky LN; BMBx- 80% Invol; del 11; START Benda-Ritux x4 [finished Sep 2011];   # July 2015-Progression; Sep 2015-START ibrutinib; CT scan DEC 2015- Improvement LN; Cont Ibrutinib '140mg'$ /d; NOV 2016 CT- 1-2CM LN [mild progression compared to Dec 2015];NOV 2016- FISH peripheral blood- NO MUTATIONS/CD-38 Positive; NOV 7th- CONT IBRUTINIB 2 pills/day; CT AUG 2017- STABLE;  DEC 6th PET- Mild RP LN/ Retrocrural LN  # OFF ibrutinib [? intol]- sep 2019- Jan 16th 2020; Re-start Ibrutinib; September 2020-stop ibrutinib [poor tolerance/worsening anemia]  # Jan 16th 2020- start aranesp; HOLD while on [place].   # MARCH 11th 2021[name]   #  DEC 2019- PAIN CONTRACT  # PALLIATIVE CARE- 06/21/2019-  # October 2019-bone marrow biopsy [worsening anemia]-question dyserythropoietic changes/small clone of CLL;  #  [facility] HEM- NEG.  SA skin infection [Oct 2016] s/p clinda -------------------------------------------------------    DIAGNOSIS: CLL  STAGE:   IV      ;GOALS: palliative  CURRENT/MOST RECENT THERAPY : Gazyva [C]   CLL (chronic lymphocytic leukemia) ([place])  07/08/2019 -  Chemotherapy   The patient had obinutuzumab (GAZYVA) 100 mg in sodium chloride 0.9 % 100 mL (0.9615 mg/mL) chemo infusion, 100 mg, Intravenous, Once, 3 of 6 cycles Administration: 100 mg (07/08/2019), 900 mg (07/09/2019), 1,000 mg (07/26/2019), 1,000 mg (08/16/2019), 1,000 mg (08/02/2019), 1,000 mg (09/13/2019)  for chemotherapy treatment.      ALLERGIES:  has No Known Allergies.  MEDICATIONS:  Current Outpatient Medications  Medication Sig Dispense Refill  . acyclovir (ZOVIRAX) 400 MG tablet One pill a day [to prevent shingles] 30 tablet 3  . aspirin EC 81 MG tablet Take 81 mg by mouth daily.    Marland Kitchen atorvastatin (LIPITOR) 10 MG tablet Take 1 tablet (10 mg total) by mouth daily. 30 tablet 11  . Calcium Carb-Cholecalciferol (CALCIUM PLUS D3 ABSORBABLE) [phone] MG-UNIT CAPS Take 1 capsule by mouth 2 (two) times daily with a meal. 60 capsule 5  . cephALEXin (KEFLEX) 500 MG capsule Take 1 capsule (500 mg total) by mouth 3 (three) times daily. 30 capsule 0  . Cholecalciferol (VITAMIN D3) 125 MCG (5000 UT) CAPS Take 1 capsule (5,000 Units total) by mouth daily with breakfast. Take along with calcium and magnesium. 30 capsule 5  . clopidogrel (PLAVIX) 75 MG tablet Take 1 tablet by mouth once daily 90 tablet 0  . dexamethasone (DECADRON) 4 MG tablet Start 2 days prior to infusion; Take for 2 days. Do not take on the day of infusion. 60 tablet 3  . Ensure (ENSURE) Take 237 mLs by mouth.    . folic acid (FOLVITE) 1 MG tablet Take 1 tablet (1 mg total) by mouth daily. (Patient not taking: Reported on 09/10/2019) 30 tablet 1  . iron polysaccharides (NU-IRON) 150 MG capsule Take 1 capsule (150 mg total) by mouth daily. 30 capsule 1    . metFORMIN (GLUCOPHAGE) 1000 MG tablet Take 1,000 mg by mouth daily with breakfast.     . montelukast (SINGULAIR) 10 MG tablet Take 1 tablet (10 mg total) by mouth at bedtime. Start 2 days prior to infusion. Take it for 4 days. 60 tablet 0  . morphine (MS CONTIN) 15 MG 12 hr tablet Take 1 tablet (15 mg total) by mouth every 12 (twelve) hours. 30 tablet 0  . naloxegol oxalate (MOVANTIK) 25 MG TABS tablet Take 1 tablet (25 mg total) by mouth daily. 30 tablet 0  . oxyCODONE-acetaminophen (PERCOCET/ROXICET) 5-325 MG tablet Take 1 tablet by mouth every 4 (four) hours as needed for severe pain. 90 tablet 0  . sodium polystyrene (KAYEXALATE) 15 GM/60ML suspension Take 60 mLs (15 g total) by mouth every 6 (six) hours. Until 2 loose stools (Patient not taking: Reported on 09/10/2019) 240 mL 0   No current facility-administered medications for this visit.    VITAL SIGNS: There were no vitals taken for this visit. There were no vitals filed for this visit.  Estimated body mass index is 21.99 kg/m as calculated from the following:   Height as of 09/12/19: 5' 8"  (1.727 m).   Weight as of 09/13/19: 144 lb 9.6 oz (65.6 kg).  LABS: CBC:  Component Value Date/Time   WBC 6.4 09/20/2019 0814   HGB 8.6 (L) 09/20/2019 0814   HGB 11.4 (L) 08/09/2014 1122   HCT 28.2 (L) 09/20/2019 0814   HCT 35.6 (L) 08/09/2014 1122   PLT 214 09/20/2019 0814   PLT 95 (L) 08/09/2014 1122   MCV 109.7 (H) 09/20/2019 0814   MCV 100 08/09/2014 1122   NEUTROABS 3.8 09/20/2019 0814   NEUTROABS 4.3 08/09/2014 1122   LYMPHSABS 2.0 09/20/2019 0814   LYMPHSABS 6.5 (H) 08/09/2014 1122   MONOABS 0.5 09/20/2019 0814   MONOABS 1.1 (H) 08/09/2014 1122   EOSABS 0.0 09/20/2019 0814   EOSABS 0.1 08/09/2014 1122   BASOSABS 0.0 09/20/2019 0814   BASOSABS 0.1 08/09/2014 1122   Comprehensive Metabolic Panel:    Component Value Date/Time   NA 138 09/13/2019 0822   NA 142 05/03/2014 1139   K 5.6 (H) 09/13/2019 0822   K 4.7  05/03/2014 1139   CL 103 09/13/2019 0822   CL 110 (H) 05/03/2014 1139   CO2 24 09/13/2019 0822   CO2 24 05/03/2014 1139   BUN 46 (H) 09/13/2019 0822   BUN 20 (H) 05/03/2014 1139   CREATININE 1.49 (H) 09/13/2019 0822   CREATININE 1.22 08/09/2014 1122   GLUCOSE 168 (H) 09/13/2019 0822   GLUCOSE 98 05/03/2014 1139   CALCIUM 9.2 09/13/2019 0822   CALCIUM 8.6 05/03/2014 1139   AST 14 (L) 09/13/2019 0822   AST 15 05/03/2014 1139   ALT 12 09/13/2019 0822   ALT 26 05/03/2014 1139   ALKPHOS 75 09/13/2019 0822   ALKPHOS 94 05/03/2014 1139   BILITOT 0.5 09/13/2019 0822   BILITOT 0.5 05/03/2014 1139   PROT 6.9 09/13/2019 0822   PROT 7.5 05/03/2014 1139   ALBUMIN 3.7 09/13/2019 0822   ALBUMIN 4.1 05/03/2014 1139    RADIOGRAPHIC STUDIES: DG Chest 2 View  Result Date: 09/12/2019 CLINICAL DATA:  Shortness of breath, chronic abdominal symptoms EXAM: CHEST - 2 VIEW COMPARISON:  CT chest 06/18/2019, radiograph 12/28/2018 FINDINGS: Hyperinflation with flattening of the diaphragms and some coarsened basilar interstitial changes with apical lucency compatible with emphysematous features on comparison CT. No consolidation, features of edema, pneumothorax, or effusion. The aorta is calcified. The remaining cardiomediastinal contours are unremarkable. No acute osseous or soft tissue abnormality. Degenerative changes are present in the imaged spine and shoulders. Surgical clips present within the upper abdomen. IMPRESSION: 1. Findings consistent with emphysema/COPD. 2. No acute cardiopulmonary abnormality. Electronically Signed   By: Lovena Le M.D.   On: 09/12/2019 18:51    PERFORMANCE STATUS (ECOG) : 1 - Symptomatic but completely ambulatory  Review of Systems Unless otherwise noted, a complete review of systems is negative.  Physical Exam General: NAD Cardiovascular: regular rate and rhythm Pulmonary: clear ant fields Abdomen: soft, nontender, + bowel sounds GU: no suprapubic  tenderness Extremities: no edema, no joint deformities Skin: See attached images Neurological: Weakness but otherwise nonfocal        Assessment and Plan- Patient is a 69 y.o. male with multiple medical problems including stage IV CLL (initially diagnosed in 2006) and chronic abdominal pain, who was recently diagnosed with lesion on his left toe and presents to clinic today for reevaluation after 1 week of Keflex.   Foot/ear lesion-both lesions appear clinically stable.  They do not appear infected to me.  I suspect the patient's foot lesion is subcutaneous in nature and may need to be excised.  Also suspect that patient might benefit from biopsy of  his ear given the nonhealing nature and long duration of the lesion.  Eric Form, RN has contacted [facility] and made an appointment for patient to see Dr. Posey Pronto with podiatry on Thursday 5/27.  We will also make a referral for patient to see dermatology regarding his ear.  In the interim, patient can continue his Keflex as previously prescribed.  Case and plan discussed with Dr. Rogue Bussing  Patient expressed understanding and was in agreement with this plan. He also understands that He can call clinic at any time with any questions, concerns, or complaints.   Thank you for allowing me to participate in the care of this very pleasant patient.   Time Total: 15 minutes  Visit consisted of counseling and education dealing with the complex and emotionally intense issues of symptom management and palliative care in the setting of serious and potentially life-threatening illness.Greater than 50%  of this time was spent counseling and coordinating care related to the above assessment and plan.  Signed by: Altha Harm, PhD, NP-C

## 2019-09-21 NOTE — Telephone Encounter (Signed)
Podiatry referral entered on 5/15. Podiatry office had not yet reached out to the patient. I personally contacted [facility] to follow-up on the referral. Apt made for Thursday 5/27 with Dr. Posey Pronto.  Billey Chang, NP will evaluate the patient today with labs prior (cbc, metb, hold tube) (labs v/o from Dr. Rogue Bussing).  Pt contacted accepted apt for 145 pm for labs and smc apt with Josh.

## 2019-09-21 NOTE — Telephone Encounter (Signed)
Patient called and reports that the sore on his toe is not any better with the cream that was ordered. It is not any worse either. He is asking if the podiatry referral has been made or if it will be made. Please advise

## 2019-09-23 ENCOUNTER — Ambulatory Visit (INDEPENDENT_AMBULATORY_CARE_PROVIDER_SITE_OTHER): Payer: Medicare Other

## 2019-09-23 ENCOUNTER — Ambulatory Visit (INDEPENDENT_AMBULATORY_CARE_PROVIDER_SITE_OTHER): Payer: Medicare Other | Admitting: Podiatry

## 2019-09-23 ENCOUNTER — Other Ambulatory Visit: Payer: Self-pay | Admitting: Podiatry

## 2019-09-23 ENCOUNTER — Other Ambulatory Visit: Payer: Self-pay

## 2019-09-23 DIAGNOSIS — M79672 Pain in left foot: Secondary | ICD-10-CM

## 2019-09-23 DIAGNOSIS — L97522 Non-pressure chronic ulcer of other part of left foot with fat layer exposed: Secondary | ICD-10-CM | POA: Diagnosis not present

## 2019-09-23 DIAGNOSIS — M722 Plantar fascial fibromatosis: Secondary | ICD-10-CM

## 2019-09-30 ENCOUNTER — Encounter: Payer: Self-pay | Admitting: Podiatry

## 2019-09-30 ENCOUNTER — Inpatient Hospital Stay: Payer: Medicare Other | Attending: Internal Medicine

## 2019-09-30 DIAGNOSIS — C9110 Chronic lymphocytic leukemia of B-cell type not having achieved remission: Secondary | ICD-10-CM | POA: Insufficient documentation

## 2019-09-30 DIAGNOSIS — Z79899 Other long term (current) drug therapy: Secondary | ICD-10-CM | POA: Insufficient documentation

## 2019-09-30 DIAGNOSIS — D631 Anemia in chronic kidney disease: Secondary | ICD-10-CM | POA: Insufficient documentation

## 2019-09-30 DIAGNOSIS — N183 Chronic kidney disease, stage 3 unspecified: Secondary | ICD-10-CM | POA: Insufficient documentation

## 2019-09-30 DIAGNOSIS — Z5112 Encounter for antineoplastic immunotherapy: Secondary | ICD-10-CM | POA: Insufficient documentation

## 2019-09-30 NOTE — Progress Notes (Signed)
Subjective:  [name], male    DOB: Nov 10, 1950,  MRN: [id number]  Chief Complaint  Patient presents with  . Foot Pain    pt is here for a possible wound of the left 5th toe, pt states it has been going on for a couple of months, pt has tried taking cephalexin.    69 y.o. male presents for wound care.  Patient presents with a wound to the left fifth digit lateral aspect.  Patient states been going for 2 to 3 months.  Has progressive gotten worse.  There is some pain associated with it.  No clinical signs of infection noted.  Patient is a type II diabetic with last A1c of 6.0.  Patient had was given Keflex in which seems to help with some erythema.  He denies any other acute complaints.  He would like to get it evaluated and do a proper local wound care therefore prevent the progression of amputation.  He denies any other acute complaints.   Review of Systems: Negative except as noted in the HPI. Denies N/V/F/Ch.  Past Medical History:  Diagnosis Date  . CLL (chronic lymphocytic leukemia) ([place])   . Constipation   . Depression   . Diabetes mellitus without complication ([place])   . Hematuria   . Hyperlipidemia   . Hypertension   . Therapeutic opioid induced constipation     Current Outpatient Medications:  .  acyclovir (ZOVIRAX) 400 MG tablet, One pill a day [to prevent shingles], Disp: 30 tablet, Rfl: 3 .  aspirin EC 81 MG tablet, Take 81 mg by mouth daily., Disp: , Rfl:  .  atorvastatin (LIPITOR) 10 MG tablet, Take 1 tablet (10 mg total) by mouth daily., Disp: 30 tablet, Rfl: 11 .  Calcium Carb-Cholecalciferol (CALCIUM PLUS D3 ABSORBABLE) [phone] MG-UNIT CAPS, Take 1 capsule by mouth 2 (two) times daily with a meal., Disp: 60 capsule, Rfl: 5 .  cephALEXin (KEFLEX) 500 MG capsule, Take 1 capsule (500 mg total) by mouth 3 (three) times daily., Disp: 30 capsule, Rfl: 0 .  Cholecalciferol (VITAMIN D3) 125 MCG (5000 UT) CAPS, Take 1 capsule (5,000 Units total) by mouth  daily with breakfast. Take along with calcium and magnesium., Disp: 30 capsule, Rfl: 5 .  clopidogrel (PLAVIX) 75 MG tablet, Take 1 tablet by mouth once daily, Disp: 90 tablet, Rfl: 0 .  dexamethasone (DECADRON) 4 MG tablet, Start 2 days prior to infusion; Take for 2 days. Do not take on the day of infusion., Disp: 60 tablet, Rfl: 3 .  Ensure (ENSURE), Take 237 mLs by mouth., Disp: , Rfl:  .  folic acid (FOLVITE) 1 MG tablet, Take 1 tablet (1 mg total) by mouth daily., Disp: 30 tablet, Rfl: 1 .  iron polysaccharides (NU-IRON) 150 MG capsule, Take 1 capsule (150 mg total) by mouth daily., Disp: 30 capsule, Rfl: 1 .  metFORMIN (GLUCOPHAGE) 1000 MG tablet, Take 1,000 mg by mouth daily with breakfast. , Disp: , Rfl:  .  montelukast (SINGULAIR) 10 MG tablet, Take 1 tablet (10 mg total) by mouth at bedtime. Start 2 days prior to infusion. Take it for 4 days., Disp: 60 tablet, Rfl: 0 .  morphine (MS CONTIN) 15 MG 12 hr tablet, Take 1 tablet (15 mg total) by mouth every 12 (twelve) hours., Disp: 30 tablet, Rfl: 0 .  naloxegol oxalate (MOVANTIK) 25 MG TABS tablet, Take 1 tablet (25 mg total) by mouth daily., Disp: 30 tablet, Rfl: 0 .  oxyCODONE-acetaminophen (PERCOCET/ROXICET) 5-325  MG tablet, Take 1 tablet by mouth every 4 (four) hours as needed for severe pain., Disp: 90 tablet, Rfl: 0 .  sodium polystyrene (KAYEXALATE) 15 GM/60ML suspension, Take 60 mLs (15 g total) by mouth every 6 (six) hours. Until 2 loose stools, Disp: 240 mL, Rfl: 0 .  sodium zirconium cyclosilicate (LOKELMA) 10 g PACK packet, Take by mouth., Disp: , Rfl:   Social History   Tobacco Use  Smoking Status Current Every Day Smoker  . Packs/day: 0.50  . Years: 47.00  . Pack years: 23.50  . Types: Cigarettes  Smokeless Tobacco Never Used    No Known Allergies Objective:  There were no vitals filed for this visit. There is no height or weight on file to calculate BMI. Constitutional Well developed. Well nourished.  Vascular  Dorsalis pedis pulses palpable bilaterally. Posterior tibial pulses palpable bilaterally. Capillary refill normal to all digits.  No cyanosis or clubbing noted. Pedal hair growth normal.  Neurologic Normal speech. Oriented to person, place, and time. Protective sensation absent  Dermatologic Wound Location: Left lateral fifth digit ulcer does not probe down to bone.  No erythema redness malodor purulent drainage noted. Wound Base: Mixed Granular/Fibrotic Peri-wound: Calloused Exudate: Scant/small amount Serosanguinous exudate Wound Measurements: -See below  Orthopedic: No pain to palpation either foot.   Radiographs: 3 views of skeletally mature adult left foot: No signs of cortical irregularity noted.  No signs of osteomyelitis noted.  No soft tissue emphysema noted.  Posterior and plantar heel spurring noted.  Assessment:   1. Foot pain, left   2. Skin ulcer of left great toe with fat layer exposed ([place])    Plan:  Patient was evaluated and treated and all questions answered.  Ulcer left lateral fifth digit ulceration with fat layer exposed -Debridement as below. -Dressed with Betadine wet-to-dry, DSD. -Continue off-loading with surgical shoe.  Procedure: Excisional Debridement of Wound Tool: Sharp chisel blade/tissue nipper Rationale: Removal of non-viable soft tissue from the wound to promote healing.  Anesthesia: none Pre-Debridement Wound Measurements: 0.3 cm x 0.3 cm x 0.2 cm  Post-Debridement Wound Measurements: 0.4 cm x 0.4 cm x 0.2 cm  Type of Debridement: Sharp Excisional Tissue Removed: Non-viable soft tissue Blood loss: Minimal (<50cc) Depth of Debridement: subcutaneous tissue. Technique: Sharp excisional debridement to bleeding, viable wound base.  Wound Progress: This is initial evaluation I will continue to monitor progression of the wound. Site healing conversation 7 Dressing: Dry, sterile, compression dressing. Disposition: Patient tolerated procedure  well. Patient to return in 1 week for follow-up.  No follow-ups on file.

## 2019-09-30 NOTE — Progress Notes (Signed)
Nutrition Follow-up:  Patient with stage IV CLL.  Patient receiving gazyva.   Spoke with patient via phone for nutrition follow-up.  Patient reports that his appetite is not that good.  Reports that chronic stomach pain effects how much he is able to eat.  Reports planning to see pain doctor in July.  Reports bowels are moving 2-3 days with help of movantik.  Tries to drink ensure.  Today so far has eaten peanut butter sandwich and drank and ensure.      Medications: reviewed  Labs: reviewed  Anthropometrics:   Weight 144 lb on 5/17 154 lb on 4/19 (?? Accuracy) 143 lb on 4/5   NUTRITION DIAGNOSIS: Inadequate oral intake continues   INTERVENTION:  Encouraged continues use of oral nutrition supplements.  Will provide additional case of ensure on 6/14.   Encouraged small frequent meals. Patient has contact information    MONITORING, EVALUATION, GOAL: Patient will consume adequate calories and protein to prevent weight loss   NEXT VISIT: July 12 during infusion  James [name], [place], [place] Registered Dietitian [phone] (pager)

## 2019-10-08 ENCOUNTER — Other Ambulatory Visit (INDEPENDENT_AMBULATORY_CARE_PROVIDER_SITE_OTHER): Payer: Self-pay | Admitting: Nurse Practitioner

## 2019-10-11 ENCOUNTER — Inpatient Hospital Stay (HOSPITAL_BASED_OUTPATIENT_CLINIC_OR_DEPARTMENT_OTHER): Payer: Medicare Other | Admitting: Hospice and Palliative Medicine

## 2019-10-11 ENCOUNTER — Encounter: Payer: Self-pay | Admitting: Internal Medicine

## 2019-10-11 ENCOUNTER — Other Ambulatory Visit: Payer: Self-pay | Admitting: *Deleted

## 2019-10-11 ENCOUNTER — Other Ambulatory Visit: Payer: Self-pay

## 2019-10-11 ENCOUNTER — Inpatient Hospital Stay: Payer: Medicare Other

## 2019-10-11 ENCOUNTER — Inpatient Hospital Stay (HOSPITAL_BASED_OUTPATIENT_CLINIC_OR_DEPARTMENT_OTHER): Payer: Medicare Other | Admitting: Internal Medicine

## 2019-10-11 VITALS — BP 109/68 | HR 67 | Resp 18

## 2019-10-11 DIAGNOSIS — D631 Anemia in chronic kidney disease: Secondary | ICD-10-CM

## 2019-10-11 DIAGNOSIS — C9112 Chronic lymphocytic leukemia of B-cell type in relapse: Secondary | ICD-10-CM

## 2019-10-11 DIAGNOSIS — I70219 Atherosclerosis of native arteries of extremities with intermittent claudication, unspecified extremity: Secondary | ICD-10-CM

## 2019-10-11 DIAGNOSIS — N183 Chronic kidney disease, stage 3 unspecified: Secondary | ICD-10-CM | POA: Diagnosis not present

## 2019-10-11 DIAGNOSIS — Z515 Encounter for palliative care: Secondary | ICD-10-CM

## 2019-10-11 DIAGNOSIS — C9110 Chronic lymphocytic leukemia of B-cell type not having achieved remission: Secondary | ICD-10-CM | POA: Diagnosis not present

## 2019-10-11 DIAGNOSIS — Z79899 Other long term (current) drug therapy: Secondary | ICD-10-CM | POA: Diagnosis not present

## 2019-10-11 DIAGNOSIS — G893 Neoplasm related pain (acute) (chronic): Secondary | ICD-10-CM | POA: Diagnosis not present

## 2019-10-11 DIAGNOSIS — Z5112 Encounter for antineoplastic immunotherapy: Secondary | ICD-10-CM | POA: Diagnosis present

## 2019-10-11 DIAGNOSIS — D649 Anemia, unspecified: Secondary | ICD-10-CM

## 2019-10-11 DIAGNOSIS — C8300 Small cell B-cell lymphoma, unspecified site: Secondary | ICD-10-CM

## 2019-10-11 LAB — COMPREHENSIVE METABOLIC PANEL
ALT: 12 U/L (ref 0–44)
AST: 14 U/L — ABNORMAL LOW (ref 15–41)
Albumin: 4.1 g/dL (ref 3.5–5.0)
Alkaline Phosphatase: 62 U/L (ref 38–126)
Anion gap: 10 (ref 5–15)
BUN: 34 mg/dL — ABNORMAL HIGH (ref 8–23)
CO2: 26 mmol/L (ref 22–32)
Calcium: 9.2 mg/dL (ref 8.9–10.3)
Chloride: 102 mmol/L (ref 98–111)
Creatinine, Ser: 1.28 mg/dL — ABNORMAL HIGH (ref 0.61–1.24)
GFR calc Af Amer: >60 mL/min (ref >60)
GFR calc non Af Amer: 56 mL/min — ABNORMAL LOW (ref >60)
Glucose, Bld: 159 mg/dL — ABNORMAL HIGH (ref 70–99)
Potassium: 5.1 mmol/L (ref 3.5–5.1)
Sodium: 138 mmol/L (ref 135–145)
Total Bilirubin: 0.6 mg/dL (ref 0.3–1.2)
Total Protein: 7.1 g/dL (ref 6.5–8.1)

## 2019-10-11 LAB — CBC WITH DIFFERENTIAL/PLATELET
Abs Immature Granulocytes: 0.04 10*3/uL (ref 0.00–0.07)
Basophils Absolute: 0 10*3/uL (ref 0.0–0.1)
Basophils Relative: 0 %
Eosinophils Absolute: 0 10*3/uL (ref 0.0–0.5)
Eosinophils Relative: 0 %
HCT: 25.3 % — ABNORMAL LOW (ref 39.0–52.0)
Hemoglobin: 7.8 g/dL — ABNORMAL LOW (ref 13.0–17.0)
Immature Granulocytes: 1 %
Lymphocytes Relative: 25 %
Lymphs Abs: 1.5 10*3/uL (ref 0.7–4.0)
MCH: 33.9 pg (ref 26.0–34.0)
MCHC: 30.8 g/dL (ref 30.0–36.0)
MCV: 110 fL — ABNORMAL HIGH (ref 80.0–100.0)
Monocytes Absolute: 0.8 10*3/uL (ref 0.1–1.0)
Monocytes Relative: 13 %
Neutro Abs: 3.8 10*3/uL (ref 1.7–7.7)
Neutrophils Relative %: 61 %
Platelets: 339 10*3/uL (ref 150–400)
RBC: 2.3 MIL/uL — ABNORMAL LOW (ref 4.22–5.81)
RDW: 17.7 % — ABNORMAL HIGH (ref 11.5–15.5)
WBC: 6.1 10*3/uL (ref 4.0–10.5)
nRBC: 0 % (ref 0.0–0.2)

## 2019-10-11 LAB — SAMPLE TO BLOOD BANK

## 2019-10-11 LAB — PREPARE RBC (CROSSMATCH)

## 2019-10-11 LAB — LACTATE DEHYDROGENASE: LDH: 116 U/L (ref 98–192)

## 2019-10-11 MED ORDER — DARBEPOETIN ALFA 300 MCG/0.6ML IJ SOSY
300.0000 ug | PREFILLED_SYRINGE | Freq: Once | INTRAMUSCULAR | Status: AC
  Administered 2019-10-11: 300 ug via SUBCUTANEOUS
  Filled 2019-10-11: qty 0.6

## 2019-10-11 MED ORDER — SODIUM CHLORIDE 0.9 % IV SOLN
20.0000 mg | Freq: Once | INTRAVENOUS | Status: AC
  Administered 2019-10-11: 20 mg via INTRAVENOUS
  Filled 2019-10-11: qty 20

## 2019-10-11 MED ORDER — SODIUM CHLORIDE 0.9 % IV SOLN
Freq: Once | INTRAVENOUS | Status: AC
  Filled 2019-10-11: qty 250

## 2019-10-11 MED ORDER — ACETAMINOPHEN 325 MG PO TABS
650.0000 mg | ORAL_TABLET | Freq: Once | ORAL | Status: AC
  Administered 2019-10-11: 650 mg via ORAL
  Filled 2019-10-11: qty 2

## 2019-10-11 MED ORDER — OXYCODONE-ACETAMINOPHEN 5-325 MG PO TABS: 1.0000 | ORAL_TABLET | ORAL | 0 refills | Status: DC | PRN

## 2019-10-11 MED ORDER — MONTELUKAST SODIUM 10 MG PO TABS
10.0000 mg | ORAL_TABLET | Freq: Once | ORAL | Status: AC
  Administered 2019-10-11: 10 mg via ORAL
  Filled 2019-10-11: qty 1

## 2019-10-11 MED ORDER — SODIUM CHLORIDE 0.9 % IV SOLN
1000.0000 mg | Freq: Once | INTRAVENOUS | Status: AC
  Administered 2019-10-11: 1000 mg via INTRAVENOUS
  Filled 2019-10-11: qty 40

## 2019-10-11 MED ORDER — DIPHENHYDRAMINE HCL 50 MG/ML IJ SOLN
50.0000 mg | Freq: Once | INTRAMUSCULAR | Status: AC
  Administered 2019-10-11: 50 mg via INTRAVENOUS
  Filled 2019-10-11: qty 1

## 2019-10-11 NOTE — Progress Notes (Signed)
[facility] [place] OFFICE PROGRESS NOTE  Patient Care Team: [name], MD as PCP - General (Internal Medicine) Cammie Sickle, MD as Consulting Physician (Internal Medicine)  Cancer Staging No matching staging information was found for the patient.   Oncology History Overview Note  # 2006- CLL STAGE IV; MAY 2011- WBC- 57K;Platelets-99;Hb-12/CT Bulky LN; BMBx- 80% Invol; del 11; START Benda-Ritux x4 [finished Sep 2011];   # July 2015-Progression; Sep 2015-START ibrutinib; CT scan DEC 2015- Improvement LN; Cont Ibrutinib 134m/d; NOV 2016 CT- 1-2CM LN [mild progression compared to Dec 2015];NOV 2016- FISH peripheral blood- NO MUTATIONS/CD-38 Positive; NOV 7th- CONT IBRUTINIB 2 pills/day; CT AUG 2017- STABLE;  DEC 6th PET- Mild RP LN/ Retrocrural LN  # OFF ibrutinib [? intol]- sep 2019- Jan 16th 2020; Re-start Ibrutinib; September 2020-stop ibrutinib [poor tolerance/worsening anemia]  # Jan 16th 2020- start aranesp; HOLD [place]   # MARCH 11th 2021-[name]  # DEC 2019- PAIN CONTRACT  # PALLIATIVE CARE- 06/21/2019-  # October 2019-bone marrow biopsy [worsening anemia]-question dyserythropoietic changes/small clone of CLL;  # [facility] HEM- NEG.  SA skin infection [Oct 2016] s/p clinda -------------------------------------------------------    DIAGNOSIS: CLL  STAGE:   IV      ;GOALS: palliative  CURRENT/MOST RECENT THERAPY : Gazyva [C]   CLL (chronic lymphocytic leukemia) ([place]  07/08/2019 -  Chemotherapy   The patient had obinutuzumab (GAZYVA) 100 mg in sodium chloride 0.9 % 100 mL (0.9615 mg/mL) chemo infusion, 100 mg, Intravenous, Once, 4 of 6 cycles Administration: 100 mg (07/08/2019), 900 mg (07/09/2019), 1,000 mg (07/26/2019), 1,000 mg (08/16/2019), 1,000 mg (08/02/2019), 1,000 mg (09/13/2019)  for chemotherapy treatment.      INTERVAL HISTORY:  FGAVINN COLLARD89y.o.  male pleasant patient above progressive CLL and chronic  abdominal pain of unclear etiology; anemia- ckd/hyperkalemia currently GDyann Kiefis here for follow-up.  Patient had cycle #3 Gazyva approximately 4 weeks ago.   Patient continues to have abdominal pain intermittently.  Not any better.  Is awaiting evaluation with pain physician.  Poor appetite.  No further weight loss.   Review of Systems  Constitutional: Positive for malaise/fatigue and weight loss. Negative for chills, diaphoresis and fever.  HENT: Negative for nosebleeds and sore throat.   Eyes: Negative for double vision.  Respiratory: Negative for cough, hemoptysis, sputum production, shortness of breath and wheezing.   Cardiovascular: Negative for chest pain, palpitations, orthopnea and leg swelling.  Gastrointestinal: Positive for abdominal pain, constipation and nausea. Negative for blood in stool, diarrhea, heartburn, melena and vomiting.  Genitourinary: Negative for dysuria, frequency and urgency.  Skin: Negative.  Negative for itching and rash.  Neurological: Negative for dizziness, tingling, focal weakness, weakness and headaches.  Endo/Heme/Allergies: Does not bruise/bleed easily.  Psychiatric/Behavioral: Negative for depression. The patient is not nervous/anxious and does not have insomnia.       PAST MEDICAL HISTORY :  Past Medical History:  Diagnosis Date  . CLL (chronic lymphocytic leukemia) ([facility]   . Constipation   . Depression   . Diabetes mellitus without complication ([facility]   . Hematuria   . Hyperlipidemia   . Hypertension   . Therapeutic opioid induced constipation     PAST SURGICAL HISTORY :   Past Surgical History:  Procedure Laterality Date  . CATARACT EXTRACTION W/ INTRAOCULAR LENS IMPLANT Bilateral   . CHOLECYSTECTOMY  1983  . COLONOSCOPY WITH PROPOFOL N/A 10/27/2017   Procedure: COLONOSCOPY WITH PROPOFOL;  Surgeon: EManya Silvas  MD;  Location: [facility] ENDOSCOPY;  Service: Endoscopy;  Laterality: N/A;  . ESOPHAGOGASTRODUODENOSCOPY (EGD) WITH PROPOFOL  N/A 11/20/2016   Procedure: ESOPHAGOGASTRODUODENOSCOPY (EGD) WITH PROPOFOL;  Surgeon: Manya Silvas, MD;  Location[facility] END[facility]COPY;  Service: Endoscopy;  Laterality: N/A;  . ESOPHAGOGASTRODUODENOSCOPY (EGD) WITH PROPOFOL N/A 10/27/2017   Procedure: ESOPHAGOGASTRODUODENOSCOPY (EGD) WITH PROPOFOL;  Surgeon: Manya Silvas, MD;  Location: [facility] ENDOSCOPY;  Service: Endoscopy;  Laterality: N/A;  . LOWER EXTREMITY ANGIOGRAPHY Right 05/19/2017   Procedure: LOWER EXTREMITY ANGIOGRAPHY;  Surgeon: Algernon Huxley, MD;  Location: [facility] CV LAB;  Service: Cardiovascular;  Laterality: Right;  . LOWER EXTREMITY ANGIOGRAPHY Left 08/10/2018   Procedure: LOWER EXTREMITY ANGIOGRAPHY;  Surgeon: Algernon Huxley, MD;  Location: [facility] CV LAB;  Service: Cardiovascular;  Laterality: Left;    FAMILY HISTORY :   Family History  Problem Relation Age of Onset  . Hypertension Sister     SOCIAL HISTORY:   Social History   Tobacco Use  . Smoking status: Current Every Day Smoker    Packs/day: 0.50    Years: 47.00    Pack years: 23.50    Types: Cigarettes  . Smokeless tobacco: Never Used  Vaping Use  . Vaping Use: Never used  Substance Use Topics  . Alcohol use: Yes    Alcohol/week: 1.0 standard drink    Types: 1 Glasses of wine per week    Comment:  almost none in last 6 months  . Drug use: No    ALLERGIES:  has No Known Allergies.  MEDICATIONS:  Current Outpatient Medications  Medication Sig Dispense Refill  . acyclovir (ZOVIRAX) 400 MG tablet One pill a day [to prevent shingles] 30 tablet 3  . aspirin EC 81 MG tablet Take 81 mg by mouth daily.    Marland Kitchen atorvastatin (LIPITOR) 10 MG tablet Take 1 tablet (10 mg total) by mouth daily. 30 tablet 11  . Calcium Carb-Cholecalciferol (CALCIUM PLUS D3 ABSORBABLE) [phone] MG-UNIT CAPS Take 1 capsule by mouth 2 (two) times daily with a meal. 60 capsule 5  . cephALEXin (KEFLEX) 500 MG capsule Take 1 capsule (500 mg total) by mouth 3 (three) times daily. 30  capsule 0  . Cholecalciferol (VITAMIN D3) 125 MCG (5000 UT) CAPS Take 1 capsule (5,000 Units total) by mouth daily with breakfast. Take along with calcium and magnesium. 30 capsule 5  . clopidogrel (PLAVIX) 75 MG tablet Take 1 tablet by mouth once daily 90 tablet 0  . dexamethasone (DECADRON) 4 MG tablet Start 2 days prior to infusion; Take for 2 days. Do not take on the day of infusion. 60 tablet 3  . Ensure (ENSURE) Take 237 mLs by mouth.    . folic acid (FOLVITE) 1 MG tablet Take 1 tablet (1 mg total) by mouth daily. 30 tablet 1  . iron polysaccharides (NU-IRON) 150 MG capsule Take 1 capsule (150 mg total) by mouth daily. 30 capsule 1  . metFORMIN (GLUCOPHAGE) 1000 MG tablet Take 1,000 mg by mouth daily with breakfast.     . montelukast (SINGULAIR) 10 MG tablet Take 1 tablet (10 mg total) by mouth at bedtime. Start 2 days prior to infusion. Take it for 4 days. 60 tablet 0  . morphine (MS CONTIN) 15 MG 12 hr tablet Take 1 tablet (15 mg total) by mouth every 12 (twelve) hours. 30 tablet 0  . naloxegol oxalate (MOVANTIK) 25 MG TABS tablet Take 1 tablet (25 mg total) by mouth daily. 30 tablet 0  . oxyCODONE-acetaminophen (  PERCOCET/ROXICET) 5-325 MG tablet Take 1 tablet by mouth every 4 (four) hours as needed for severe pain. 90 tablet 0  . sodium polystyrene (KAYEXALATE) 15 GM/60ML suspension Take 60 mLs (15 g total) by mouth every 6 (six) hours. Until 2 loose stools 240 mL 0  . sodium zirconium cyclosilicate (LOKELMA) 10 g PACK packet Take by mouth.     No current facility-administered medications for this visit.   Facility-Administered Medications Ordered in Other Visits  Medication Dose Route Frequency Provider Last Rate Last Admin  . 0.9 %  sodium chloride infusion   Intravenous Once Cammie Sickle, MD      . acetaminophen (TYLENOL) tablet 650 mg  650 mg Oral Once Cammie Sickle, MD      . dexamethasone (DECADRON) 20 mg in sodium chloride 0.9 % 50 mL IVPB  20 mg Intravenous Once  Charlaine Dalton R, MD      . diphenhydrAMINE (BENADRYL) injection 50 mg  50 mg Intravenous Once Charlaine Dalton R, MD      . montelukast (SINGULAIR) tablet 10 mg  10 mg Oral Once Cammie Sickle, MD        PHYSICAL EXAMINATION: ECOG PERFORMANCE STATUS: 1 - Symptomatic but completely ambulatory  BP (!) 145/81 (BP Location: Left Arm, Patient Position: Sitting, Cuff Size: Normal)   Pulse 98   Temp (!) 97.4 F (36.3 C) (Tympanic)   Wt 143 lb 8 oz (65.1 kg)   BMI 21.82 kg/m   Filed Weights   10/11/19 0815  Weight: 143 lb 8 oz (65.1 kg)    Physical Exam Constitutional:      Comments: He is alone.  Appears pale.  HENT:     Head: Normocephalic and atraumatic.     Mouth/Throat:     Pharynx: No oropharyngeal exudate.  Eyes:     Pupils: Pupils are equal, round, and reactive to light.  Cardiovascular:     Rate and Rhythm: Normal rate and regular rhythm.  Pulmonary:     Effort: No respiratory distress.     Breath sounds: No wheezing.  Abdominal:     General: Bowel sounds are normal. There is no distension.     Palpations: Abdomen is soft. There is no mass.     Tenderness: There is no abdominal tenderness. There is no guarding or rebound.  Musculoskeletal:        General: No tenderness. Normal range of motion.     Cervical back: Normal range of motion and neck supple.  Skin:    General: Skin is warm.     Coloration: Skin is pale.     Comments: Multiple bruises noted in bilateral upper extremity.  Neurological:     Mental Status: He is alert and oriented to person, place, and time.  Psychiatric:        Mood and Affect: Affect normal.       LABORATORY DATA:  I have reviewed the data as listed    Component Value Date/Time   NA 138 10/11/2019 0758   NA 142 05/03/2014 1139   K 5.1 10/11/2019 0758   K 4.7 05/03/2014 1139   CL 102 10/11/2019 0758   CL 110 (H) 05/03/2014 1139   CO2 26 10/11/2019 0758   CO2 24 05/03/2014 1139   GLUCOSE 159 (H) 10/11/2019 0758    GLUCOSE 98 05/03/2014 1139   BUN 34 (H) 10/11/2019 0758   BUN 20 (H) 05/03/2014 1139   CREATININE 1.28 (H) 10/11/2019 0758   CREATININE 1.22  08/09/2014 1122   CALCIUM 9.2 10/11/2019 0758   CALCIUM 8.6 05/03/2014 1139   PROT 7.1 10/11/2019 0758   PROT 7.5 05/03/2014 1139   ALBUMIN 4.1 10/11/2019 0758   ALBUMIN 4.1 05/03/2014 1139   AST 14 (L) 10/11/2019 0758   AST 15 05/03/2014 1139   ALT 12 10/11/2019 0758   ALT 26 05/03/2014 1139   ALKPHOS 62 10/11/2019 0758   ALKPHOS 94 05/03/2014 1139   BILITOT 0.6 10/11/2019 0758   BILITOT 0.5 05/03/2014 1139   GFRNONAA 56 (L) 10/11/2019 0758   GFRNONAA >60 08/09/2014 1122   GFRAA >60 10/11/2019 0758   GFRAA >60 08/09/2014 1122    No results found for: SPEP, UPEP  Lab Results  Component Value Date   WBC 6.1 10/11/2019   NEUTROABS 3.8 10/11/2019   HGB 7.8 (L) 10/11/2019   HCT 25.3 (L) 10/11/2019   MCV 110.0 (H) 10/11/2019   PLT 339 10/11/2019      Chemistry      Component Value Date/Time   NA 138 10/11/2019 0758   NA 142 05/03/2014 1139   K 5.1 10/11/2019 0758   K 4.7 05/03/2014 1139   CL 102 10/11/2019 0758   CL 110 (H) 05/03/2014 1139   CO2 26 10/11/2019 0758   CO2 24 05/03/2014 1139   BUN 34 (H) 10/11/2019 0758   BUN 20 (H) 05/03/2014 1139   CREATININE 1.28 (H) 10/11/2019 0758   CREATININE 1.22 08/09/2014 1122      Component Value Date/Time   CALCIUM 9.2 10/11/2019 0758   CALCIUM 8.6 05/03/2014 1139   ALKPHOS 62 10/11/2019 0758   ALKPHOS 94 05/03/2014 1139   AST 14 (L) 10/11/2019 0758   AST 15 05/03/2014 1139   ALT 12 10/11/2019 0758   ALT 26 05/03/2014 1139   BILITOT 0.6 10/11/2019 0758   BILITOT 0.5 05/03/2014 1139       RADIOGRAPHIC STUDIES: I have personally reviewed the radiological images as listed and agreed with the findings in the report. No results found.   ASSESSMENT & PLAN:  CLL (chronic lymphocytic leukemia) ([place]) # CLL/SLL- relapsed most recently on ibrutinib; FEB 2021- CT Ab/Pelvis-CT  scan chest and pelvis shows significant progression of axillary/mediastinal/abdominal/pelvic-however the largest lesion approximately inch in size. MARCH 2021- ~ 2.5 cm RP LN; multiple smaller LN.  Currently on Gazyva.  #Proceed with Gazyva #4; today. Labs today reviewed;  acceptable for treatment today except for anemia hb-7.8.   #Chronic abdominal pain-unclear etiology-stable patient's urine porphyrins were slightly abnormal ; plasma fractionated porphyrin- Normal. continue narcotics/follow-up with pain management.    #Hyperkalemia/CKD-III-secondary renal insufficiency/creatinine 1.3- [baseline 1.4; Dr.Lateef].;  Stable.   # Anemia-hemoglobin-worse than baseline.  7.8.-Secondary to CKD versus progressive leukemia;proceed with Aranesp today. Plan PRBC transfusion  # Left toe infection s/P podiatry evaluation.  Improved.  # DISPOSITION:  # Aranesp; Gazyva; today # tomorrow- 1 unit PRBC transfusion.  # 4 week- MD; labs- cbc/cmpLDH; HOLD tube;possible aranesp; Gazyva-Dr.B   No orders of the defined types were placed in this encounter.  All questions were answered. The patient knows to call the clinic with any problems, questions or concerns.      Cammie Sickle, MD 10/11/2019 9:00 AM

## 2019-10-11 NOTE — Assessment & Plan Note (Addendum)
#   CLL/SLL- relapsed most recently on ibrutinib; FEB 2021- CT Ab/Pelvis-CT scan chest and pelvis shows significant progression of axillary/mediastinal/abdominal/pelvic-however the largest lesion approximately inch in size. MARCH 2021- ~ 2.5 cm RP LN; multiple smaller LN.  Currently on Gazyva.  #Proceed with Gazyva #4; today. Labs today reviewed;  acceptable for treatment today except for anemia hb-7.8.   #Chronic abdominal pain-unclear etiology-stable patient's urine porphyrins were slightly abnormal ; plasma fractionated porphyrin- Normal. continue narcotics/follow-up with pain management.    #Hyperkalemia/CKD-III-secondary renal insufficiency/creatinine 1.3- [baseline 1.4; Dr.Lateef].;  Stable.   # Anemia-hemoglobin-worse than baseline.  7.8.-Secondary to CKD versus progressive leukemia;proceed with Aranesp today. Plan PRBC transfusion  # Left toe infection s/P podiatry evaluation.  Improved.  # DISPOSITION:  # Aranesp; Gazyva; today # tomorrow- 1 unit PRBC transfusion.  # 4 week- MD; labs- cbc/cmpLDH; HOLD tube;possible aranesp; Gazyva-Dr.B

## 2019-10-11 NOTE — Telephone Encounter (Signed)
Pt requesting RF on oxycodone 

## 2019-10-11 NOTE — Progress Notes (Signed)
[place]  [phone] [phone]   Name: James [name] Date: 10/11/2019 MRN: [id number]  DOB: [birth year]  Patient Care Team: [name], MD as PCP - General (Internal Medicine) Cammie Sickle, MD as Consulting Physician (Internal Medicine)    REASON FOR CONSULTATION: TALBOT MONARCH is a 69 y.o. male with multiple medical problems including stage IV CLL (initially diagnosed in 2006) most recently treated with ibrutinib but was discontinued due to poor tolerance. He is now on treatment with Gazyva.  Patient has had severe and chronic abdominal pain of unclear etiology.  He has had extensive work-up including referral to [facility].  He has been followed by interventional pain management and is status post celiac plexus blocks without significant relief in symptoms.  He was referred to palliative care to help address goals and manage ongoing symptoms..   SOCIAL HISTORY:     reports that he has been smoking cigarettes. He has a 23.50 pack-year smoking history. He has never used smokeless tobacco. He reports current alcohol use of about 1.0 standard drink of alcohol per week. He reports that he does not use drugs.   Patient is married and lives at home with his wife.  He has a son in [place].  Patient formally worked as a Administrator.  ADVANCE DIRECTIVES:  Does not have  CODE STATUS: DNR/DNI (MOST form completed on 07/08/19  PAST MEDICAL HISTORY: Past Medical History:  Diagnosis Date  . CLL (chronic lymphocytic leukemia) ([place])   . Constipation   . Depression   . Diabetes mellitus without complication ([place])   . Hematuria   . Hyperlipidemia   . Hypertension   . Therapeutic opioid induced constipation     PAST SURGICAL HISTORY:  Past Surgical History:  Procedure Laterality Date  . CATARACT EXTRACTION W/ INTRAOCULAR LENS IMPLANT Bilateral   . CHOLECYSTECTOMY  1983  . COLONOSCOPY WITH PROPOFOL N/A  10/27/2017   Procedure: COLONOSCOPY WITH PROPOFOL;  Surgeon: Manya Silvas, MD;  Location: [facility] ENDOSCOPY;  Service: Endoscopy;  Laterality: N/A;  . ESOPHAGOGASTRODUODENOSCOPY (EGD) WITH PROPOFOL N/A 11/20/2016   Procedure: ESOPHAGOGASTRODUODENOSCOPY (EGD) WITH PROPOFOL;  Surgeon: Manya Silvas, MD;  Location: [facility] ENDOSCOPY;  Service: Endoscopy;  Laterality: N/A;  . ESOPHAGOGASTRODUODENOSCOPY (EGD) WITH PROPOFOL N/A 10/27/2017   Procedure: ESOPHAGOGASTRODUODENOSCOPY (EGD) WITH PROPOFOL;  Surgeon: Manya Silvas, MD;  Location: [facility] ENDOSCOPY;  Service: Endoscopy;  Laterality: N/A;  . LOWER EXTREMITY ANGIOGRAPHY Right 05/19/2017   Procedure: LOWER EXTREMITY ANGIOGRAPHY;  Surgeon: Algernon Huxley, MD;  Location: [facility];  Service: Cardiovascular;  Laterality: Right;  . LOWER EXTREMITY ANGIOGRAPHY Left 08/10/2018   Procedure: LOWER EXTREMITY ANGIOGRAPHY;  Surgeon: Algernon Huxley, MD;  Location: [facility];  Service: Cardiovascular;  Laterality: Left;    HEMATOLOGY/ONCOLOGY HISTORY:  Oncology History Overview Note  # 2006- CLL STAGE IV; MAY 2011- WBC- 57K;Platelets-99;Hb-12/CT Bulky LN; BMBx- 80% Invol; del 11; START Benda-Ritux x4 [finished Sep 2011];   # July 2015-Progression; Sep 2015-START ibrutinib; CT scan DEC 2015- Improvement LN; Cont Ibrutinib 153m/d; NOV 2016 CT- 1-2CM LN [mild progression compared to Dec 2015];NOV 2016- FISH peripheral blood- NO MUTATIONS/CD-38 Positive; NOV 7th- CONT IBRUTINIB 2 pills/day; CT AUG 2017- STABLE;  DEC 6th PET- Mild RP LN/ Retrocrural LN  # OFF ibrutinib [? intol]- sep 2019- Jan 16th 2020; Re-start Ibrutinib; September 2020-stop ibrutinib [poor tolerance/worsening anemia]  # Jan 16th 2020- start [place]anesp; [place]   #  MARCH 11th 2021[name]   # DEC 2019- PAIN CONTRACT  # PALLIATIVE CARE- 06/21/2019-  # October 2019-bone marrow biopsy [worsening anemia]-question dyserythropoietic changes/small clone of CLL;  #  [facility] HEM- NEG.  SA skin infection [Oct 2016] s/p clinda -------------------------------------------------------    DIAGNOSIS: CLL  STAGE:   IV      ;GOALS: palliative  CURRENT/MOST RECENT THERAPY : Gazyva [C]   CLL (chronic lymphocytic leukemia) ([place])  07/08/2019 -  Chemotherapy   The patient had obinutuzumab (GAZYVA) 100 mg in sodium chloride 0.9 % 100 mL (0.9615 mg/mL) chemo infusion, 100 mg, Intravenous, Once, 4 of 6 cycles Administration: 100 mg (07/08/2019), 900 mg (07/09/2019), 1,000 mg (07/26/2019), 1,000 mg (08/16/2019), 1,000 mg (08/02/2019), 1,000 mg (09/13/2019)  for chemotherapy treatment.      ALLERGIES:  has No Known Allergies.  MEDICATIONS:  Current Outpatient Medications  Medication Sig Dispense Refill  . acyclovir (ZOVIRAX) 400 MG tablet One pill a day [to prevent shingles] 30 tablet 3  . aspirin EC 81 MG tablet Take 81 mg by mouth daily.    Marland Kitchen atorvastatin (LIPITOR) 10 MG tablet Take 1 tablet (10 mg total) by mouth daily. 30 tablet 11  . Calcium Carb-Cholecalciferol (CALCIUM PLUS D3 ABSORBABLE) [phone] MG-UNIT CAPS Take 1 capsule by mouth 2 (two) times daily with a meal. 60 capsule 5  . cephALEXin (KEFLEX) 500 MG capsule Take 1 capsule (500 mg total) by mouth 3 (three) times daily. 30 capsule 0  . Cholecalciferol (VITAMIN D3) 125 MCG (5000 UT) CAPS Take 1 capsule (5,000 Units total) by mouth daily with breakfast. Take along with calcium and magnesium. 30 capsule 5  . clopidogrel (PLAVIX) 75 MG tablet Take 1 tablet by mouth once daily 90 tablet 0  . dexamethasone (DECADRON) 4 MG tablet Start 2 days prior to infusion; Take for 2 days. Do not take on the day of infusion. 60 tablet 3  . Ensure (ENSURE) Take 237 mLs by mouth.    . folic acid (FOLVITE) 1 MG tablet Take 1 tablet (1 mg total) by mouth daily. 30 tablet 1  . iron polysaccharides (NU-IRON) 150 MG capsule Take 1 capsule (150 mg total) by mouth daily. 30 capsule 1  . metFORMIN (GLUCOPHAGE) 1000 MG tablet  Take 1,000 mg by mouth daily with breakfast.     . montelukast (SINGULAIR) 10 MG tablet Take 1 tablet (10 mg total) by mouth at bedtime. Start 2 days prior to infusion. Take it for 4 days. 60 tablet 0  . morphine (MS CONTIN) 15 MG 12 hr tablet Take 1 tablet (15 mg total) by mouth every 12 (twelve) hours. 30 tablet 0  . naloxegol oxalate (MOVANTIK) 25 MG TABS tablet Take 1 tablet (25 mg total) by mouth daily. 30 tablet 0  . oxyCODONE-acetaminophen (PERCOCET/ROXICET) 5-325 MG tablet Take 1 tablet by mouth every 4 (four) hours as needed for severe pain. 90 tablet 0  . sodium polystyrene (KAYEXALATE) 15 GM/60ML suspension Take 60 mLs (15 g total) by mouth every 6 (six) hours. Until 2 loose stools 240 mL 0  . sodium zirconium cyclosilicate (LOKELMA) 10 g PACK packet Take by mouth.     No current facility-administered medications for this visit.    VITAL SIGNS: There were no vitals taken for this visit. There were no vitals filed for this visit.  Estimated body mass index is 21.82 kg/m as calculated from the following:   Height as of 09/12/19: _0  (1.727 m).   Weight as of an  earlier encounter on 10/11/19: 143 lb 8 oz (65.1 kg).  LABS: CBC:    Component Value Date/Time   WBC 6.1 10/11/2019 0758   HGB 7.8 (L) 10/11/2019 0758   HGB 11.4 (L) 08/09/2014 1122   HCT 25.3 (L) 10/11/2019 0758   HCT 35.6 (L) 08/09/2014 1122   PLT 339 10/11/2019 0758   PLT 95 (L) 08/09/2014 1122   MCV 110.0 (H) 10/11/2019 0758   MCV 100 08/09/2014 1122   NEUTROABS 3.8 10/11/2019 0758   NEUTROABS 4.3 08/09/2014 1122   LYMPHSABS 1.5 10/11/2019 0758   LYMPHSABS 6.5 (H) 08/09/2014 1122   MONOABS 0.8 10/11/2019 0758   MONOABS 1.1 (H) 08/09/2014 1122   EOSABS 0.0 10/11/2019 0758   EOSABS 0.1 08/09/2014 1122   BASOSABS 0.0 10/11/2019 0758   BASOSABS 0.1 08/09/2014 1122   Comprehensive Metabolic Panel:    Component Value Date/Time   NA 138 10/11/2019 0758   NA 142 05/03/2014 1139   K 5.1 10/11/2019 0758   K  4.7 05/03/2014 1139   CL 102 10/11/2019 0758   CL 110 (H) 05/03/2014 1139   CO2 26 10/11/2019 0758   CO2 24 05/03/2014 1139   BUN 34 (H) 10/11/2019 0758   BUN 20 (H) 05/03/2014 1139   CREATININE 1.28 (H) 10/11/2019 0758   CREATININE 1.22 08/09/2014 1122   GLUCOSE 159 (H) 10/11/2019 0758   GLUCOSE 98 05/03/2014 1139   CALCIUM 9.2 10/11/2019 0758   CALCIUM 8.6 05/03/2014 1139   AST 14 (L) 10/11/2019 0758   AST 15 05/03/2014 1139   ALT 12 10/11/2019 0758   ALT 26 05/03/2014 1139   ALKPHOS 62 10/11/2019 0758   ALKPHOS 94 05/03/2014 1139   BILITOT 0.6 10/11/2019 0758   BILITOT 0.5 05/03/2014 1139   PROT 7.1 10/11/2019 0758   PROT 7.5 05/03/2014 1139   ALBUMIN 4.1 10/11/2019 0758   ALBUMIN 4.1 05/03/2014 1139    RADIOGRAPHIC STUDIES: DG Chest 2 View  Result Date: 09/12/2019 CLINICAL DATA:  Shortness of breath, chronic abdominal symptoms EXAM: CHEST - 2 VIEW COMPARISON:  CT chest 06/18/2019, radiograph 12/28/2018 FINDINGS: Hyperinflation with flattening of the diaphragms and some coarsened basilar interstitial changes with apical lucency compatible with emphysematous features on comparison CT. No consolidation, features of edema, pneumothorax, or effusion. The aorta is calcified. The remaining cardiomediastinal contours are unremarkable. No acute osseous or soft tissue abnormality. Degenerative changes are present in the imaged spine and shoulders. Surgical clips present within the upper abdomen. IMPRESSION: 1. Findings consistent with emphysema/COPD. 2. No acute cardiopulmonary abnormality. Electronically Signed   By: Lovena Le M.D.   On: 09/12/2019 18:51   DG Foot Complete Left  Result Date: 09/23/2019 Please see detailed radiograph report in office note.   PERFORMANCE STATUS (ECOG) : 1 - Symptomatic but completely ambulatory  Review of Systems Unless otherwise noted, a complete review of systems is negative.  Physical Exam General: NAD Pulmonary: Unlabored Abdomen: soft,  nontender, + bowel sounds GU: no suprapubic tenderness Extremities: no edema, no joint deformities Skin: no rashes Neurological: Weakness but otherwise nonfocal  IMPRESSION: Routine follow-up visit.  Patient was in the infusion area.  Patient denies any significant changes or concerns.  He continues to endorse chronic abdominal pain.  He has scheduled follow-up with Dr. Dossie Arbour in July and there has been some discussion of consideration for trial of an intrathecal pain pump.  Patient says he plans to call Dr. Letitia Caul office to see if he can be seen sooner than July.  Patient did have recent appointment with podiatry and had his lesion on his toe debrided.  Patient reports that that is healing well although that was not evaluated by me today.  He does have scheduled follow-up with podiatry.  Patient has pending appointment with dermatology to evaluate his ear lesion.  Patient says oral intake is chronically poor.  We discussed use of supplements.  He has seen nutrition in the past.  PLAN: -Continue current scope of treatment -Continue MS Contin 15 mg every 12 hours -Continue Percocet 5 325 mg every 4 hours as needed for breakthrough pain -Prophylactic bowel regimen -Follow-up with Dr. Dossie Arbour -Follow-up MyChart visit in 1 to 2 months   Patient expressed understanding and was in agreement with this plan. He also understands that He can call the clinic at any time with any questions, concerns, or complaints.     Time Total: 15 minutes  Visit consisted of counseling and education dealing with the complex and emotionally intense issues of symptom management and palliative care in the setting of serious and potentially life-threatening illness.Greater than 50%  of this time was spent counseling and coordinating care related to the above assessment and plan.  Signed by: Altha Harm, PhD, NP-C

## 2019-10-12 ENCOUNTER — Other Ambulatory Visit: Payer: Self-pay | Admitting: Internal Medicine

## 2019-10-12 ENCOUNTER — Inpatient Hospital Stay: Payer: Medicare Other

## 2019-10-12 DIAGNOSIS — Z5112 Encounter for antineoplastic immunotherapy: Secondary | ICD-10-CM | POA: Diagnosis not present

## 2019-10-12 DIAGNOSIS — C9110 Chronic lymphocytic leukemia of B-cell type not having achieved remission: Secondary | ICD-10-CM

## 2019-10-12 DIAGNOSIS — D649 Anemia, unspecified: Secondary | ICD-10-CM

## 2019-10-12 MED ORDER — ACETAMINOPHEN 325 MG PO TABS
650.0000 mg | ORAL_TABLET | Freq: Once | ORAL | Status: AC
  Administered 2019-10-12: 650 mg via ORAL
  Filled 2019-10-12: qty 2

## 2019-10-12 MED ORDER — DIPHENHYDRAMINE HCL 25 MG PO CAPS
25.0000 mg | ORAL_CAPSULE | Freq: Once | ORAL | Status: AC
  Administered 2019-10-12: 25 mg via ORAL
  Filled 2019-10-12: qty 1

## 2019-10-12 MED ORDER — SODIUM CHLORIDE 0.9% IV SOLUTION
250.0000 mL | Freq: Once | INTRAVENOUS | Status: AC
  Administered 2019-10-12: 250 mL via INTRAVENOUS
  Filled 2019-10-12: qty 250

## 2019-10-13 ENCOUNTER — Other Ambulatory Visit: Payer: Self-pay | Admitting: *Deleted

## 2019-10-13 LAB — TYPE AND SCREEN
ABO/RH(D): A POS
Antibody Screen: NEGATIVE
Unit division: 0

## 2019-10-13 LAB — BPAM RBC
Blood Product Expiration Date: 202106222359
ISSUE DATE / TIME: 202106151154
Unit Type and Rh: 9500

## 2019-10-13 MED ORDER — MORPHINE SULFATE ER 15 MG PO TBCR: 15.0000 mg | EXTENDED_RELEASE_TABLET | Freq: Two times a day (BID) | ORAL | 0 refills | Status: DC

## 2019-10-14 ENCOUNTER — Ambulatory Visit: Payer: Medicare Other | Admitting: Podiatry

## 2019-10-18 ENCOUNTER — Other Ambulatory Visit: Payer: Self-pay | Admitting: *Deleted

## 2019-10-18 DIAGNOSIS — C9112 Chronic lymphocytic leukemia of B-cell type in relapse: Secondary | ICD-10-CM

## 2019-10-18 DIAGNOSIS — C8300 Small cell B-cell lymphoma, unspecified site: Secondary | ICD-10-CM

## 2019-10-18 MED ORDER — OXYCODONE-ACETAMINOPHEN 5-325 MG PO TABS: 1.0000 | ORAL_TABLET | ORAL | 0 refills | Status: DC | PRN

## 2019-10-18 NOTE — Telephone Encounter (Signed)
Patient called cancer center requesting refill of Percocet 5-325 mg tablets every 4 hours as needed for severe pain.  As mandated by the [place] STOP Act (Strengthen Opioid Misuse Prevention), the [place] Controlled Substance Reporting System ([place]) was reviewed for this patient. Below is the past 41-months of controlled substance prescriptions as displayed by the registry. I have personally consulted with my supervising physician, Dr. Rogue Bussing, who agrees that continuation of opiate therapy is medically appropriate at this time and agrees to provide continual monitoring, including urine/blood drug screens, as indicated. Refill is appropriate on or after 10/17/2019.  [place]CC[place] reviewed: Reviewed and appropriate.    Faythe Casa, NP 10/18/2019 10:49 AM [phone]

## 2019-10-19 ENCOUNTER — Encounter: Payer: Self-pay | Admitting: Pain Medicine

## 2019-10-19 ENCOUNTER — Ambulatory Visit: Payer: Medicare Other | Attending: Pain Medicine | Admitting: Pain Medicine

## 2019-10-19 ENCOUNTER — Other Ambulatory Visit: Payer: Self-pay

## 2019-10-19 VITALS — BP 133/84 | HR 90 | Temp 98.2°F | Resp 16 | Ht 68.0 in | Wt 140.0 lb

## 2019-10-19 DIAGNOSIS — I70219 Atherosclerosis of native arteries of extremities with intermittent claudication, unspecified extremity: Secondary | ICD-10-CM | POA: Diagnosis not present

## 2019-10-19 DIAGNOSIS — Z7901 Long term (current) use of anticoagulants: Secondary | ICD-10-CM

## 2019-10-19 DIAGNOSIS — R109 Unspecified abdominal pain: Secondary | ICD-10-CM | POA: Diagnosis present

## 2019-10-19 DIAGNOSIS — E559 Vitamin D deficiency, unspecified: Secondary | ICD-10-CM | POA: Diagnosis present

## 2019-10-19 DIAGNOSIS — K219 Gastro-esophageal reflux disease without esophagitis: Secondary | ICD-10-CM | POA: Diagnosis present

## 2019-10-19 DIAGNOSIS — G8929 Other chronic pain: Secondary | ICD-10-CM | POA: Diagnosis not present

## 2019-10-19 DIAGNOSIS — C9110 Chronic lymphocytic leukemia of B-cell type not having achieved remission: Secondary | ICD-10-CM | POA: Diagnosis present

## 2019-10-19 DIAGNOSIS — R1013 Epigastric pain: Secondary | ICD-10-CM | POA: Diagnosis present

## 2019-10-19 MED ORDER — MISOPROSTOL 100 MCG PO TABS: 100.0000 ug | ORAL_TABLET | Freq: Four times a day (QID) | ORAL | 2 refills | Status: DC

## 2019-10-19 MED ORDER — MAGNESIUM OXIDE -MG SUPPLEMENT 500 MG PO CAPS: 1.0000 | ORAL_CAPSULE | Freq: Every day | ORAL | 2 refills | Status: DC

## 2019-10-19 MED ORDER — VITAMIN D3 125 MCG (5000 UT) PO CAPS: 1.0000 | ORAL_CAPSULE | Freq: Every day | ORAL | 1 refills | Status: DC

## 2019-10-19 MED ORDER — ESOMEPRAZOLE MAGNESIUM 40 MG PO CPDR: 40.0000 mg | DELAYED_RELEASE_CAPSULE | Freq: Every day | ORAL | 0 refills | Status: DC

## 2019-10-19 NOTE — Patient Instructions (Addendum)
____________________________________________________________________________________________  Preparing for Procedure with Sedation  Procedure appointments are limited to planned procedures: . No Prescription Refills. . No disability issues will be discussed. . No medication changes will be discussed.  Instructions: . Oral Intake: Do not eat or drink anything for at least 8 hours prior to your procedure. (Exception: Blood Pressure Medication. See below.) . Transportation: Unless otherwise stated by your physician, you may drive yourself after the procedure. . Blood Pressure Medicine: Do not forget to take your blood pressure medicine with a sip of water the morning of the procedure. If your Diastolic (lower reading)is above 100 mmHg, elective cases will be cancelled/rescheduled. . Blood thinners: These will need to be stopped for procedures. Notify our staff if you are taking any blood thinners. Depending on which one you take, there will be specific instructions on how and when to stop it. . Diabetics on insulin: Notify the staff so that you can be scheduled 1st case in the morning. If your diabetes requires high dose insulin, take only  of your normal insulin dose the morning of the procedure and notify the staff that you have done so. . Preventing infections: Shower with an antibacterial soap the morning of your procedure. . Build-up your immune system: Take 1000 mg of Vitamin C with every meal (3 times a day) the day prior to your procedure. . Antibiotics: Inform the staff if you have a condition or reason that requires you to take antibiotics before dental procedures. . Pregnancy: If you are pregnant, call and cancel the procedure. . Sickness: If you have a cold, fever, or any active infections, call and cancel the procedure. . Arrival: You must be in the facility at least 30 minutes prior to your scheduled procedure. . Children: Do not bring children with you. . Dress appropriately:  Bring dark clothing that you would not mind if they get stained. . Valuables: Do not bring any jewelry or valuables.  Reasons to call and reschedule or cancel your procedure: (Following these recommendations will minimize the risk of a serious complication.) . Surgeries: Avoid having procedures within 2 weeks of any surgery. (Avoid for 2 weeks before or after any surgery). . Flu Shots: Avoid having procedures within 2 weeks of a flu shots or . (Avoid for 2 weeks before or after immunizations). . Barium: Avoid having a procedure within 7-10 days after having had a radiological study involving the use of radiological contrast. (Myelograms, Barium swallow or enema study). . Heart attacks: Avoid any elective procedures or surgeries for the initial 6 months after a "Myocardial Infarction" (Heart Attack). . Blood thinners: It is imperative that you stop these medications before procedures. Let us know if you if you take any blood thinner.  . Infection: Avoid procedures during or within two weeks of an infection (including chest colds or gastrointestinal problems). Symptoms associated with infections include: Localized redness, fever, chills, night sweats or profuse sweating, burning sensation when voiding, cough, congestion, stuffiness, runny nose, sore throat, diarrhea, nausea, vomiting, cold or Flu symptoms, recent or current infections. It is specially important if the infection is over the area that we intend to treat. . Heart and lung problems: Symptoms that may suggest an active cardiopulmonary problem include: cough, chest pain, breathing difficulties or shortness of breath, dizziness, ankle swelling, uncontrolled high or unusually low blood pressure, and/or palpitations. If you are experiencing any of these symptoms, cancel your procedure and contact your primary care physician for an evaluation.  Remember:  Regular Business hours are:    Monday to Thursday 8:00 AM to 4:00 PM  Provider's  Schedule: Coren Crownover, MD:  Procedure days: Tuesday and Thursday 7:30 AM to 4:00 PM  Bilal Lateef, MD:  Procedure days: Monday and Wednesday 7:30 AM to 4:00 PM ____________________________________________________________________________________________   ____________________________________________________________________________________________  General Risks and Possible Complications  Patient Responsibilities: It is important that you read this as it is part of your informed consent. It is our duty to inform you of the risks and possible complications associated with treatments offered to you. It is your responsibility as a patient to read this and to ask questions about anything that is not clear or that you believe was not covered in this document.  Patient's Rights: You have the right to refuse treatment. You also have the right to change your mind, even after initially having agreed to have the treatment done. However, under this last option, if you wait until the last second to change your mind, you may be charged for the materials used up to that point.  Introduction: Medicine is not an exact science. Everything in Medicine, including the lack of treatment(s), carries the potential for danger, harm, or loss (which is by definition: Risk). In Medicine, a complication is a secondary problem, condition, or disease that can aggravate an already existing one. All treatments carry the risk of possible complications. The fact that a side effects or complications occurs, does not imply that the treatment was conducted incorrectly. It must be clearly understood that these can happen even when everything is done following the highest safety standards.  No treatment: You can choose not to proceed with the proposed treatment alternative. The "PRO(s)" would include: avoiding the risk of complications associated with the therapy. The "CON(s)" would include: not getting any of the treatment  benefits. These benefits fall under one of three categories: diagnostic; therapeutic; and/or palliative. Diagnostic benefits include: getting information which can ultimately lead to improvement of the disease or symptom(s). Therapeutic benefits are those associated with the successful treatment of the disease. Finally, palliative benefits are those related to the decrease of the primary symptoms, without necessarily curing the condition (example: decreasing the pain from a flare-up of a chronic condition, such as incurable terminal cancer).  General Risks and Complications: These are associated to most interventional treatments. They can occur alone, or in combination. They fall under one of the following six (6) categories: no benefit or worsening of symptoms; bleeding; infection; nerve damage; allergic reactions; and/or death. 1. No benefits or worsening of symptoms: In Medicine there are no guarantees, only probabilities. No healthcare provider can ever guarantee that a medical treatment will work, they can only state the probability that it may. Furthermore, there is always the possibility that the condition may worsen, either directly, or indirectly, as a consequence of the treatment. 2. Bleeding: This is more common if the patient is taking a blood thinner, either prescription or over the counter (example: Goody Powders, Fish oil, Aspirin, Garlic, etc.), or if suffering a condition associated with impaired coagulation (example: Hemophilia, cirrhosis of the liver, low platelet counts, etc.). However, even if you do not have one on these, it can still happen. If you have any of these conditions, or take one of these drugs, make sure to notify your treating physician. 3. Infection: This is more common in patients with a compromised immune system, either due to disease (example: diabetes, cancer, human immunodeficiency virus [HIV], etc.), or due to medications or treatments (example: therapies used to treat  cancer and   rheumatological diseases). However, even if you do not have one on these, it can still happen. If you have any of these conditions, or take one of these drugs, make sure to notify your treating physician. 4. Nerve Damage: This is more common when the treatment is an invasive one, but it can also happen with the use of medications, such as those used in the treatment of cancer. The damage can occur to small secondary nerves, or to large primary ones, such as those in the spinal cord and brain. This damage may be temporary or permanent and it may lead to impairments that can range from temporary numbness to permanent paralysis and/or brain death. 5. Allergic Reactions: Any time a substance or material comes in contact with our body, there is the possibility of an allergic reaction. These can range from a mild skin rash (contact dermatitis) to a severe systemic reaction (anaphylactic reaction), which can result in death. 6. Death: In general, any medical intervention can result in death, most of the time due to an unforeseen complication. ____________________________________________________________________________________________  ____________________________________________________________________________________________  Blood Thinners  IMPORTANT NOTICE:  If you take any of these, make sure to notify the nursing staff.  Failure to do so may result in injury.  Recommended time intervals to stop and restart blood-thinners, before & after invasive procedures  Generic Name Brand Name Stop Time. Must be stopped at least this long before procedures. After procedures, wait at least this long before re-starting.  Abciximab Reopro 15 days 2 hrs  Alteplase Activase 10 days 10 days  Anagrelide Agrylin    Apixaban Eliquis 3 days 6 hrs  Cilostazol Pletal 3 days 5 hrs  Clopidogrel Plavix 7-10 days 2 hrs  Dabigatran Pradaxa 5 days 6 hrs  Dalteparin Fragmin 24 hours 4 hrs  Dipyridamole Aggrenox  11days 2 hrs  Edoxaban Lixiana; Savaysa 3 days 2 hrs  Enoxaparin  Lovenox 24 hours 4 hrs  Eptifibatide Integrillin 8 hours 2 hrs  Fondaparinux  Arixtra 72 hours 12 hrs  Prasugrel Effient 7-10 days 6 hrs  Reteplase Retavase 10 days 10 days  Rivaroxaban Xarelto 3 days 6 hrs  Ticagrelor Brilinta 5-7 days 6 hrs  Ticlopidine Ticlid 10-14 days 2 hrs  Tinzaparin Innohep 24 hours 4 hrs  Tirofiban Aggrastat 8 hours 2 hrs  Warfarin Coumadin 5 days 2 hrs   Other medications with blood-thinning effects  Product indications Generic (Brand) names Note  Cholesterol Lipitor Stop 4 days before procedure  Blood thinner (injectable) Heparin (LMW or LMWH Heparin) Stop 24 hours before procedure  Cancer Ibrutinib (Imbruvica) Stop 7 days before procedure  Malaria/Rheumatoid Hydroxychloroquine (Plaquenil) Stop 11 days before procedure  Thrombolytics  10 days before or after procedures   Over-the-counter (OTC) Products with blood-thinning effects  Product Common names Stop Time  Aspirin > 325 mg Goody Powders, Excedrin, etc. 11 days  Aspirin ? 81 mg  7 days  Fish oil  4 days  Garlic supplements  7 days  Ginkgo biloba  36 hours  Ginseng  24 hours  NSAIDs Ibuprofen, Naprosyn, etc. 3 days  Vitamin E  4 days   ____________________________________________________________________________________________  ____________________________________________________________________________________________  Post-Procedure Discharge Instructions  Instructions:  Apply ice:   Purpose: This will minimize any swelling and discomfort after procedure.   When: Day of procedure, as soon as you get home.  How: Fill a plastic sandwich bag with crushed ice. Cover it with a small towel and apply to injection site.  How long: (15 min on, 15 min off)  Apply for 15 minutes then remove x 15 minutes.  Repeat sequence on day of procedure, until you go to bed.  Apply heat:   Purpose: To treat any soreness and discomfort from  the procedure.  When: Starting the next day after the procedure.  How: Apply heat to procedure site starting the day following the procedure.  How long: May continue to repeat daily, until discomfort goes away.  Food intake: Start with clear liquids (like water) and advance to regular food, as tolerated.   Physical activities: Keep activities to a minimum for the first 8 hours after the procedure. After that, then as tolerated.  Driving: If you have received any sedation, be responsible and do not drive. You are not allowed to drive for 24 hours after having sedation.  Blood thinner: (Applies only to those taking blood thinners) You may restart your blood thinner 6 hours after your procedure.  Insulin: (Applies only to Diabetic patients taking insulin) As soon as you can eat, you may resume your normal dosing schedule.  Infection prevention: Keep procedure site clean and dry. Shower daily and clean area with soap and water.  Post-procedure Pain Diary: Extremely important that this be done correctly and accurately. Recorded information will be used to determine the next step in treatment. For the purpose of accuracy, follow these rules:  Evaluate only the area treated. Do not report or include pain from an untreated area. For the purpose of this evaluation, ignore all other areas of pain, except for the treated area.  After your procedure, avoid taking a long nap and attempting to complete the pain diary after you wake up. Instead, set your alarm clock to go off every hour, on the hour, for the initial 8 hours after the procedure. Document the duration of the numbing medicine, and the relief you are getting from it.  Do not go to sleep and attempt to complete it later. It will not be accurate. If you received sedation, it is likely that you were given a medication that may cause amnesia. Because of this, completing the diary at a later time may cause the information to be inaccurate. This  information is needed to plan your care.  Follow-up appointment: Keep your post-procedure follow-up evaluation appointment after the procedure (usually 2 weeks for most procedures, 6 weeks for radiofrequencies). DO NOT FORGET to bring you pain diary with you.   Expect: (What should I expect to see with my procedure?)  From numbing medicine (AKA: Local Anesthetics): Numbness or decrease in pain. You may also experience some weakness, which if present, could last for the duration of the local anesthetic.  Onset: Full effect within 15 minutes of injected.  Duration: It will depend on the type of local anesthetic used. On the average, 1 to 8 hours.   From steroids (Applies only if steroids were used): Decrease in swelling or inflammation. Once inflammation is improved, relief of the pain will follow.  Onset of benefits: Depends on the amount of swelling present. The more swelling, the longer it will take for the benefits to be seen. In some cases, up to 10 days.  Duration: Steroids will stay in the system x 2 weeks. Duration of benefits will depend on multiple posibilities including persistent irritating factors.  Side-effects: If present, they may typically last 2 weeks (the duration of the steroids).  Frequent: Cramps (if they occur, drink Gatorade and take over-the-counter Magnesium 450-500 mg once to twice a day); water retention with temporary weight gain;  increases in blood sugar; decreased immune system response; increased appetite.  Occasional: Facial flushing (red, warm cheeks); mood swings; menstrual changes.  Uncommon: Long-term decrease or suppression of natural hormones; bone thinning. (These are more common with higher doses or more frequent use. This is why we prefer that our patients avoid having any injection therapies in other practices.)   Very Rare: Severe mood changes; psychosis; aseptic necrosis.  From procedure: Some discomfort is to be expected once the numbing  medicine wears off. This should be minimal if ice and heat are applied as instructed.  Call if: (When should I call?)  You experience numbness and weakness that gets worse with time, as opposed to wearing off.  New onset bowel or bladder incontinence. (Applies only to procedures done in the spine)  Emergency Numbers:  Durning business hours (Monday - Thursday, 8:00 AM - 4:00 PM) (Friday, 9:00 AM - 12:00 Noon): [phone]  After hours: [phone]  NOTE: If you are having a problem and are unable connect with, or to talk to a provider, then go to your nearest urgent care or emergency department. If the problem is serious and urgent, please call 911. ____________________________________________________________________________________________

## 2019-10-19 NOTE — Progress Notes (Signed)
PROVIDER NOTE: Information contained herein reflects review and annotations entered in association with encounter. Interpretation of such information and data should be left to medically-trained personnel. Information provided to patient can be located elsewhere in the medical record under "Patient Instructions". Document created using STT-dictation technology, any transcriptional errors that may result from process are unintentional.    Patient: James [name]  Service Category: E/M  Provider: Gaspar Cola, MD  DOB: [birth year]  DOS: 10/19/2019  Specialty: Interventional Pain Management  MRN: [id number]  Setting: Ambulatory outpatient  PCP: [name], MD  Type: Established Patient    Referring Provider: [name], MD  Location: Office  Delivery: Face-to-face     HPI  Reason for encounter: Mr. CLEOPHAS YOAK, a 69 y.o. year old male, is here today for evaluation and management of his Chronic epigastric pain [R10.13, G89.29]. Mr. Caggiano primary complain today is Abdominal Pain (upper under rib,middle) Last encounter: Practice (08/09/2019). My last encounter with him was on 07/05/2019. Pertinent problems: Mr. Scearce has CLL (chronic lymphocytic leukemia) ([place]); Generalized abdominal pain; Malignant lymphoma, small lymphocytic ([place]); Toe cyanosis; Chronic epigastric pain (1ry area of Pain); Chronic pain syndrome; Chronic abdominal pain; Abdominal wall pain in epigastric region; Thoracic radiculitis; Right-sided low back pain without sciatica; DDD (degenerative disc disease), thoracic; DDD (degenerative disc disease), lumbosacral; Lumbar facet hypertrophy ([place]); and Cancer-related pain on their pertinent problem list. Pain Assessment: Severity of Chronic pain is reported as a 3 /10. Location: Abdomen Mid/denies. Onset: More than a month ago. Quality: Aching, Constant, Throbbing. Timing: Constant. Modifying factor(s): medications, rest. Vitals:  height is 5' 8"  (1.727 m) and weight  is 140 lb (63.5 kg). His temperature is 98.2 F (36.8 C). His blood pressure is 133/84 and his pulse is 90. His respiration is 16 and oxygen saturation is 100%.   The patient comes into the clinic today to see if there is anything that we can offer him for his chronic epigastric abdominal pain.  He denies any radicular component to this pain and he refers that it is present all the time but it increases and decreases in intensity throughout the day.  He thinks that it may be worse in the mornings.  He denies any prior nerve blocks and he refers that he has been scanned several times but they cannot find a good explanation for this pain except for the fact that he has been told that he has gastritis.  He indicates that he had a gastroscopy and that was the diagnosis that they gave him.  I asked him if he was taking any type of medication for acid reflux and he indicated that he is currently not taking any.  He also is having some problems with constipation secondary to his opioids.  He has chronic lymphocytic leukemia and has lost a significant amount of weight since this started.  At this point the first thing that I will do is to add Nexium 40 mg p.o. at bedtime to his regimen.  He indicated that he has taken some of these medications in the past but he admitted that he would only take them when he had the pain.  Unfortunately, this is not how the medications work and I have taken the time to explain to the patient that he needs to be taking it on a regular basis in order to prevent this pain from occurring.  I will also provide him with a prescription for magnesium 500 mg p.o. daily, to help with the acid  reflux and to provide him with some muscle relaxation as well.  Finally, we will go ahead and do a trial of Cytotec 100 mcg p.o. 4 times daily.  I have instructed him to decrease it to twice daily if he gets diarrhea.  However, since he is having opioid-induced constipation, it is not likely that he will go  all the way to diarrhea, but he may actually level up and become more regular.  In addition to the above, I will schedule the patient to return for a diagnostic bilateral celiac plexus block under fluoroscopic guidance and IV sedation.  He is currently on Plavix and I have requested that he stop it for 7 days prior to the procedure.  We will restart the Plavix 2 hours after the procedure.  Since he is taking steroids regularly, I do not expect any significant benefit from the steroids that we will put on the celiac plexus block, but my aim is to use it as a diagnostic tool to determine if he gets good relief of the pain for the duration of the local anesthetic.  If he does, he may be a candidate for a neurolytic celiac plexus block.  ROS  Constitutional: Denies any fever or chills Gastrointestinal: No reported hemesis, hematochezia, vomiting, or acute GI distress Musculoskeletal: Denies any acute onset joint swelling, redness, loss of ROM, or weakness Neurological: No reported episodes of acute onset apraxia, aphasia, dysarthria, agnosia, amnesia, paralysis, loss of coordination, or loss of consciousness  Medication Review  Calcium Plus D3 Absorbable, Ensure, Magnesium Oxide, Vitamin D3, acyclovir, aspirin EC, atorvastatin, cephALEXin, clopidogrel, dexamethasone, esomeprazole, folic acid, iron polysaccharides, metFORMIN, misoprostol, montelukast, morphine, naloxegol oxalate, oxyCODONE-acetaminophen, sodium polystyrene, and sodium zirconium cyclosilicate  History Review  Allergy: Mr. Kurek has No Known Allergies. Drug: Mr. Journey  reports no history of drug use. Alcohol:  reports current alcohol use of about 1.0 standard drink of alcohol per week. Tobacco:  reports that he has been smoking cigarettes. He has a 23.50 pack-year smoking history. He has never used smokeless tobacco. Social: Mr. Paglia  reports that he has been smoking cigarettes. He has a 23.50 pack-year smoking history. He has never used  smokeless tobacco. He reports current alcohol use of about 1.0 standard drink of alcohol per week. He reports that he does not use drugs. Medical:  has a past medical history of CLL (chronic lymphocytic leukemia) ([place]), Constipation, Depression, Diabetes mellitus without complication ([place]), Hematuria, Hyperlipidemia, Hypertension, and Therapeutic opioid induced constipation. Surgical: Mr. Juenger  has a past surgical history that includes Cholecystectomy (1983); Esophagogastroduodenoscopy (egd) with propofol (N/A, 11/20/2016); Cataract extraction w/ intraocular lens implant (Bilateral); Lower Extremity Angiography (Right, 05/19/2017); Esophagogastroduodenoscopy (egd) with propofol (N/A, 10/27/2017); Colonoscopy with propofol (N/A, 10/27/2017); and Lower Extremity Angiography (Left, 08/10/2018). Family: family history includes Hypertension in his sister.  Laboratory Chemistry Profile   Renal Lab Results  Component Value Date   BUN 34 (H) 10/11/2019   CREATININE 1.28 (H) 10/11/2019   GFRAA >60 10/11/2019   GFRNONAA 56 (L) 10/11/2019     Hepatic Lab Results  Component Value Date   AST 14 (L) 10/11/2019   ALT 12 10/11/2019   ALBUMIN 4.1 10/11/2019   ALKPHOS 62 10/11/2019   HCVAB NON REACTIVE 06/03/2019   AMYLASE 22 (L) 07/16/2019   LIPASE 23 09/12/2019     Electrolytes Lab Results  Component Value Date   NA 138 10/11/2019   K 5.1 10/11/2019   CL 102 10/11/2019   CALCIUM 9.2 10/11/2019  MG 2.4 06/03/2019   PHOS 4.4 06/03/2019     Bone Lab Results  Component Value Date   VD25OH 13.74 (L) 05/11/2019     Inflammation (CRP: Acute Phase) (ESR: Chronic Phase) Lab Results  Component Value Date   CRP 0.6 05/11/2019   ESRSEDRATE 39 (H) 05/11/2019   LATICACIDVEN 1.7 07/16/2019       Note: Above Lab results reviewed.  Recent Imaging Review  DG Foot Complete Left Please see detailed radiograph report in office note. Note: Reviewed        Physical Exam  General appearance: Well  nourished, well developed, and well hydrated. In no apparent acute distress Mental status: Alert, oriented x 3 (person, place, & time)       Respiratory: No evidence of acute respiratory distress Eyes: PERLA Vitals: BP 133/84   Pulse 90   Temp 98.2 F (36.8 C)   Resp 16   Ht '5\' 8"'$  (1.727 m)   Wt 140 lb (63.5 kg)   SpO2 100%   BMI 21.29 kg/m  BMI: Estimated body mass index is 21.29 kg/m as calculated from the following:   Height as of this encounter: '5\' 8"'$  (1.727 m).   Weight as of this encounter: 140 lb (63.5 kg). Ideal: Ideal body weight: 68.4 kg (150 lb 12.7 oz)  Assessment   Status Diagnosis  Controlled Controlled Controlled 1. Chronic epigastric pain (1ry area of Pain)   2. Chronic abdominal pain   3. Gastroesophageal reflux disease without esophagitis   4. CLL (chronic lymphocytic leukemia) ([place])   5. Chronic anticoagulation (Plavix)   6. Vitamin D deficiency      Updated Problems: No problems updated.  Plan of Care  Problem-specific:  No problem-specific Assessment & Plan notes found for this encounter.  Mr. JUNIEL GROENE has a current medication list which includes the following long-term medication(s): atorvastatin, calcium plus d3 absorbable, iron polysaccharides, metformin, montelukast, vitamin d3, esomeprazole, magnesium oxide, and misoprostol.  Pharmacotherapy (Medications Ordered): Meds ordered this encounter  Medications  . misoprostol (CYTOTEC) 100 MCG tablet    Sig: Take 1 tablet (100 mcg total) by mouth 4 (four) times daily. If diarrhea develops, decrease dose to twice daily.    Dispense:  120 tablet    Refill:  2    Fill one day early if pharmacy is closed on scheduled refill date. May substitute for generic if available.  Please inform patient of possible side effects and adverse reactions.  . Magnesium Oxide 500 MG CAPS    Sig: Take 1 capsule (500 mg total) by mouth daily with breakfast.    Dispense:  30 capsule    Refill:  2    Fill one day  early if pharmacy is closed on scheduled refill date. May substitute for generic if available.  . esomeprazole (NEXIUM) 40 MG capsule    Sig: Take 1 capsule (40 mg total) by mouth at bedtime.    Dispense:  90 capsule    Refill:  0    Fill one day early if pharmacy is closed on scheduled refill date. May substitute for generic if available.  . Cholecalciferol (VITAMIN D3) 125 MCG (5000 UT) CAPS    Sig: Take 1 capsule (5,000 Units total) by mouth daily with breakfast. Take along with calcium and magnesium.    Dispense:  90 capsule    Refill:  1    Fill one day early if pharmacy is closed on scheduled refill date. May substitute for generic, or similar, if  available.   Orders:  Orders Placed This Encounter  Procedures  . CELIAC PLEXUS BLOCK    Patient needs to stop Plavix x 7 days.    Standing Status:   Future    Standing Expiration Date:   11/18/2019    Scheduling Instructions:     Side: Bilateral     Sedation: With Sedation.     TIMEFRAME: ASAA    Order Specific Question:   Where will this procedure be performed?    Answer:   [facility]  . Blood Thinner Instructions to Nursing    If unable to stop, ask if Lovenox-bridge therapy may be possible, and if so, request their assistance in implementing it.    Scheduling Instructions:     Contact the physician prescribing the blood thinner and request clearance to stop it for time period stipulated below.     If approved by prescribing physician, stop Plavix (Clopidogrel) x 7-10 days prior to procedure or surgery.  . Blood Thinner Instructions to Nursing    If the patient requires a Lovenox-bridge therapy, make sure arrangements are made to institute it with the assistance of the PCP.    Standing Status:   Standing    Number of Occurrences:   36    Standing Expiration Date:   01/19/2020    Scheduling Instructions:     Always stop the Plavix (Clopidogrel) x 7-10 days prior to procedure or surgery.   Follow-up plan:   Return for  Procedure (w/ sedation): (B) CPB #1, (Blood-thinner Protocol).      Interventional treatment options: Planned, scheduled, and/or pending:    NOTE: PLAVIX ANTICOAGULATION  (Stop: 7 days  Restart: 2 hrs)    Under consideration:   Diagnostic intrathecal injection of opioid analgesic (intrathecal pump trial).   Therapeutic/palliative (PRN):   Diagnostic bilateral celiac plexus block completed (patient indicated no relief)     Recent Visits No visits were found meeting these conditions. Showing recent visits within past 90 days and meeting all other requirements Today's Visits Date Type Provider Dept  10/19/19 Office Visit Milinda Pointer, MD [facility]  Showing today's visits and meeting all other requirements Future Appointments No visits were found meeting these conditions. Showing future appointments within next 90 days and meeting all other requirements  I discussed the assessment and treatment plan with the patient. The patient was provided an opportunity to ask questions and all were answered. The patient agreed with the plan and demonstrated an understanding of the instructions.  Patient advised to call back or seek an in-person evaluation if the symptoms or condition worsens.  Duration of encounter: 30 minutes.  Note by: Gaspar Cola, MD Date: 10/19/2019; Time: 2:49 PM

## 2019-10-19 NOTE — Progress Notes (Signed)
Safety precautions to be maintained throughout the outpatient stay will include: orient to surroundings, keep bed in low position, maintain call bell within reach at all times, provide assistance with transfer out of bed and ambulation.  

## 2019-11-01 NOTE — Progress Notes (Addendum)
PROVIDER NOTE: Information contained herein reflects review and annotations entered in association with encounter. Interpretation of such information and data should be left to medically-trained personnel. Information provided to patient can be located elsewhere in the medical record under "Patient Instructions". Document created using STT-dictation technology, any transcriptional errors that may result from process are unintentional.    Patient: James [name]  Service Category: Procedure  Provider: Gaspar Cola, MD  DOB: 1950-11-16  DOS: 11/02/2019  Location: [facility]  MRN: [id number]  Setting: Ambulatory - outpatient  Referring Provider: [name], MD  Type: Established Patient  Specialty: Interventional Pain Management  PCP: [name], MD   Primary Reason for Visit: Interventional Pain Management Treatment. CC: Abdominal Pain  Procedure:          Anesthesia, Analgesia, Anxiolysis:  Type: Diagnostic Celiac Plexus Block #2  Region:Thoracolumbar Level: anterolateral aspect of T12-L1 (target); above the lateral tip of the L2 transverse process (skin puncture) Laterality: Bilateral Paramedial  Type: Moderate (Conscious) Sedation combined with Local Anesthesia Indication(s): Analgesia and Anxiety Route: Intravenous (IV) IV Access: Secured Sedation: Meaningful verbal contact was maintained at all times during the procedure  Local Anesthetic: Lidocaine 1-2%  Position: Prone with head of the table was raised to facilitate breathing.   Indications: 1. Cancer-related pain   2. Chronic abdominal pain   3. Chronic epigastric pain (1ry area of Pain)   4. CLL (chronic lymphocytic leukemia) ([place])   5. Chronic anticoagulation (Plavix)    Pain Score: Pre-procedure: 8 /10 Post-procedure: 2 /10   Pre-op Assessment:  James [name] is a 69 y.o. (year old), male patient, seen today for interventional treatment. He  has a past surgical history that includes  Cholecystectomy (1983); Esophagogastroduodenoscopy (egd) with propofol (N/A, 11/20/2016); Cataract extraction w/ intraocular lens implant (Bilateral); Lower Extremity Angiography (Right, 05/19/2017); Esophagogastroduodenoscopy (egd) with propofol (N/A, 10/27/2017); Colonoscopy with propofol (N/A, 10/27/2017); and Lower Extremity Angiography (Left, 08/10/2018). James [name] has a current medication list which includes the following prescription(s): acyclovir, aspirin ec, atorvastatin, calcium plus d3 absorbable, vitamin d3, clopidogrel, ensure, esomeprazole, folic acid, iron polysaccharides, magnesium oxide, metformin, misoprostol, montelukast, morphine, naloxegol oxalate, oxycodone-acetaminophen, sodium polystyrene, lokelma, cephalexin, and dexamethasone, and the following Facility-Administered Medications: fentanyl and midazolam. His primarily concern today is the Abdominal Pain  Initial Vital Signs:  Pulse/HCG Rate: 73ECG Heart Rate: 75 Temp: 98 F (36.7 C) Resp: 16 BP: 136/71 SpO2: 100 %  BMI: Estimated body mass index is 21.29 kg/m as calculated from the following:   Height as of this encounter: 5\' 8"  (1.727 m).   Weight as of this encounter: 140 lb (63.5 kg).  Risk Assessment: Allergies: Reviewed. He has No Known Allergies.  Allergy Precautions: None required Coagulopathies: Reviewed. None identified.  Blood-thinner therapy: None at this time Active Infection(s): Reviewed. None identified. James [name] is afebrile  Site Confirmation: James [name] was asked to confirm the procedure and laterality before marking the site Procedure checklist: Completed Consent: Before the procedure and under the influence of no sedative(s), amnesic(s), or anxiolytics, the patient was informed of the treatment options, risks and possible complications. To fulfill our ethical and legal obligations, as recommended by the American Medical Association's Code of Ethics, I have informed the patient of my clinical impression; the  nature and purpose of the treatment or procedure; the risks, benefits, and possible complications of the intervention; the alternatives, including doing nothing; the risk(s) and benefit(s) of the alternative treatment(s) or procedure(s); and the risk(s) and benefit(s) of doing nothing.  The patient was provided information about the general risks and possible complications associated with the procedure. These may include, but are not limited to: failure to achieve desired goals, infection, bleeding, organ or nerve damage, allergic reactions, paralysis, and death. In addition, the patient was informed of those risks and complications associated to Spine-related procedures, such as failure to decrease pain; infection (i.e.: Meningitis, epidural or intraspinal abscess); bleeding (i.e.: epidural hematoma, subarachnoid hemorrhage, or any other type of intraspinal or peri-dural bleeding); organ or nerve damage (i.e.: Any type of peripheral nerve, nerve root, or spinal cord injury) with subsequent damage to sensory, motor, and/or autonomic systems, resulting in permanent pain, numbness, and/or weakness of one or several areas of the body; allergic reactions; (i.e.: anaphylactic reaction); and/or death. Furthermore, the patient was informed of those risks and complications associated with the medications. These include, but are not limited to: allergic reactions (i.e.: anaphylactic or anaphylactoid reaction(s)); adrenal axis suppression; blood sugar elevation that in diabetics may result in ketoacidosis or comma; water retention that in patients with history of congestive heart failure may result in shortness of breath, pulmonary edema, and decompensation with resultant heart failure; weight gain; swelling or edema; medication-induced neural toxicity; particulate matter embolism and blood vessel occlusion with resultant organ, and/or nervous system infarction; and/or aseptic necrosis of one or more joints. Finally, the  patient was informed that Medicine is not an exact science; therefore, there is also the possibility of unforeseen or unpredictable risks and/or possible complications that may result in a catastrophic outcome. The patient indicated having understood very clearly. We have given the patient no guarantees and we have made no promises. Enough time was given to the patient to ask questions, all of which were answered to the patient's satisfaction. Mr. Baudoin has indicated that he wanted to continue with the procedure. Attestation: I, the ordering provider, attest that I have discussed with the patient the benefits, risks, side-effects, alternatives, likelihood of achieving goals, and potential problems during recovery for the procedure that I have provided informed consent. Date  Time: 11/02/2019  8:12 AM  Pre-Procedure Preparation:  Monitoring: As per clinic protocol. Respiration, ETCO2, SpO2, BP, heart rate and rhythm monitor placed and checked for adequate function Safety Precautions: Patient was assessed for positional comfort and pressure points before starting the procedure. Time-out: I initiated and conducted the "Time-out" before starting the procedure, as per protocol. The patient was asked to participate by confirming the accuracy of the "Time Out" information. Verification of the correct person, site, and procedure were performed and confirmed by me, the nursing staff, and the patient. "Time-out" conducted as per Joint Commission's Universal Protocol (UP.01.01.01). Time: 0900  Description of Procedure:          Target Area: For the Celiac Plexus Block(s), the target is the area anterolateral to the Aorta at the level of T12-L1, with the splanchnic nerves crossing the diaphragmatic crux at about the T12 level. Approach: Paravertebral, percutaneous approach. Area Prepped: Entire Posterior Thoracolumbar Region DuraPrep (Iodine Povacrylex [0.7% available iodine] and Isopropyl Alcohol, 74% w/w) Safety  Precautions: Aspiration looking for blood return was conducted prior to all injections. At no point did we inject any substances, as a needle was being advanced. No attempts were made at seeking any paresthesias. Safe injection practices and needle disposal techniques used. Medications properly checked for expiration dates. SDV (single dose vial) medications used. Description of the Procedure: Protocol guidelines were followed. The patient was placed in position over the procedure table. The target area  was identified and the area prepped in the usual manner. Skin & deeper tissues infiltrated with local anesthetic. Appropriate amount of time allowed to pass for local anesthetics to take effect. The procedure needles were introduced at the skin level of the L2 transverse process and advanced cephalad below the L1 transverse process, aiming towards the anterolateral aspect of the T12-L1 area, under pulsed fluoroscopic guidance. Care was taken not to advance the tip of the needle past the anterior border of the vertebral body, on the lateral fluoroscopic view. Proper needle placement secured. Negative aspiration confirmed. Solution injected in intermittent fashion, asking for systemic symptoms every 0.5cc of injectate. The needles were then removed and the area cleansed, making sure to leave some of the prepping solution back to take advantage of its long term bactericidal properties. Vitals:   11/02/19 0915 11/02/19 0925 11/02/19 0935 11/02/19 0946  BP: 127/84 119/61 122/73 120/72  Pulse:      Resp: 16 17 13 15   Temp:  98.4 F (36.9 C)  98.2 F (36.8 C)  SpO2: 100% 100% 100% 100%  Weight:      Height:        Start Time: 0900 hrs. End Time: 0915 hrs. Materials:  Needle(s) Type: Spinal Needle Gauge: 22G Length: 3.5-in Medication(s): Please see orders for medications and dosing details.  Imaging Guidance (Spinal):          Type of Imaging Technique: Fluoroscopy Guidance (Spinal) Indication(s):  Assistance in needle guidance and placement for procedures requiring needle placement in or near specific anatomical locations not easily accessible without such assistance. Exposure Time: Please see nurses notes. Contrast: Before injecting any contrast, we confirmed that the patient did not have an allergy to iodine, shellfish, or radiological contrast. Once satisfactory needle placement was completed at the desired level, radiological contrast was injected. Contrast injected under live fluoroscopy. No contrast complications. See chart for type and volume of contrast used. Fluoroscopic Guidance: I was personally present during the use of fluoroscopy. "Tunnel Vision Technique" used to obtain the best possible view of the target area. Parallax error corrected before commencing the procedure. "Direction-depth-direction" technique used to introduce the needle under continuous pulsed fluoroscopy. Once target was reached, antero-posterior, oblique, and lateral fluoroscopic projection used confirm needle placement in all planes. Images permanently stored in EMR. Interpretation: I personally interpreted the imaging intraoperatively. Adequate needle placement confirmed in multiple planes. Appropriate spread of contrast into desired area was observed. No evidence of afferent or efferent intravascular uptake. No intrathecal or subarachnoid spread observed. Permanent images saved into the patient's record.  Antibiotic Prophylaxis:   Anti-infectives (From admission, onward)   None     Indication(s): None identified  Post-operative Assessment:  Post-procedure Vital Signs:  Pulse/HCG Rate: 7374 Temp: 98.2 F (36.8 C) Resp: 15 BP: 120/72 SpO2: 100 %  EBL: None  Complications: No immediate post-treatment complications observed by team, or reported by patient.  Note: The patient tolerated the entire procedure well. A repeat set of vitals were taken after the procedure and the patient was kept under  observation following institutional policy, for this type of procedure. Post-procedural neurological assessment was performed, showing return to baseline, prior to discharge. The patient was provided with post-procedure discharge instructions, including a section on how to identify potential problems. Should any problems arise concerning this procedure, the patient was given instructions to immediately contact us, at any time, without hesitation. In any case, we plan to contact the patient by telephone for a follow-up status report regarding this  interventional procedure.  Comments:  No additional relevant information.  Plan of Care  Orders:  Orders Placed This Encounter  Procedures  . CELIAC PLEXUS BLOCK    CLINICAL INDICATIONS: Chronic Abdominal Pain    Scheduling Instructions:     PURPOSE: Diagnostic     LATERALITY: Bilateral     LEVEL(s): L1     SEDATION: Sedation recommended.     TIMEFRAME: Today    Order Specific Question:   Where will this procedure be performed?    Answer:   [facility]  . DG PAIN [facility] C-ARM 1-60 MIN NO REPORT    Intraoperative interpretation by procedural physician at [place].    Standing Status:   Standing    Number of Occurrences:   1    Order Specific Question:   Reason for exam:    Answer:   Assistance in needle guidance and placement for procedures requiring needle placement in or near specific anatomical locations not easily accessible without such assistance.  . Informed Consent Details: Physician/Practitioner Attestation; Transcribe to consent form and obtain patient signature    Provider Attestation: I, [place] Dossie Arbour, MD, (Pain Management Specialist), the physician/practitioner, attest that I have discussed with the patient the benefits, risks, side effects, alternatives, likelihood of achieving goals and potential problems during recovery for the procedure that I have provided informed consent.    Scheduling Instructions:      Procedure: Diagnostic bilateral celiac plexus block     Indication/Reason: Chronic abdominal pain     Note: Always confirm laterality of pain with Mr. Guzzetta, before procedure.  . Care order/instruction: Please confirm that the patient has stopped the Plavix (Clopidogrel) x 7-10 days prior to procedure or surgery.    Please confirm that the patient has stopped the Plavix (Clopidogrel) x 7-10 days prior to procedure or surgery.    Standing Status:   Standing    Number of Occurrences:   1  . Provide equipment / supplies at bedside    Equipment required: Single use, disposable, "Block Tray"    Standing Status:   Standing    Number of Occurrences:   1    Order Specific Question:   Specify    Answer:   Block Tray  . Bleeding precautions    Standing Status:   Standing    Number of Occurrences:   1   Chronic Opioid Analgesic:  No opioid analgesics prescribed by our practice. Oxycodone/APAP 5/325, 1 tab PO q 8 hrs (15 mg/day of oxycodone IR)  Highest recorded MME/day: 22.5 mg/day MME/day: 22.5 mg/day   Medications ordered for procedure: Meds ordered this encounter  Medications  . iohexol (OMNIPAQUE) 180 MG/ML injection 10 mL    Must be Myelogram-compatible. If not available, you may substitute with a water-soluble, non-ionic, hypoallergenic, myelogram-compatible radiological contrast medium.  Marland Kitchen lidocaine (XYLOCAINE) 2 % (with pres) injection 400 mg  . lactated ringers infusion 1,000 mL  . midazolam (VERSED) 5 MG/5ML injection 1-2 mg    Make sure Flumazenil is available in the pyxis when using this medication. If oversedation occurs, administer 0.2 mg IV over 15 sec. If after 45 sec no response, administer 0.2 mg again over 1 min; may repeat at 1 min intervals; not to exceed 4 doses (1 mg)  . fentaNYL (SUBLIMAZE) injection 25-50 mcg    Make sure Narcan is available in the pyxis when using this medication. In the event of respiratory depression (RR< 8/min): Titrate NARCAN (naloxone) in  increments of 0.1  to 0.2 mg IV at 2-3 minute intervals, until desired degree of reversal.  . dexamethasone (DECADRON) injection 10 mg  . bupivacaine-epinephrine (MARCAINE W/ EPI) 0.25% -1:200000 injection 14 mL   Medications administered: We administered iohexol, lidocaine, lactated ringers, midazolam, fentaNYL, dexamethasone, and bupivacaine-epinephrine.  See the medical record for exact dosing, route, and time of administration.  Follow-up plan:   Return in about 2 weeks (around 11/16/2019) for F2F(20-min), (PP).       Interventional treatment options: Planned, scheduled, and/or pending:    NOTE: PLAVIX ANTICOAGULATION  (Stop: 7 days  Restart: 2 hrs)    Under consideration:   Diagnostic intrathecal injection of opioid analgesic (intrathecal pump trial).   Therapeutic/palliative (PRN):   Diagnostic bilateral celiac plexus block completed (patient indicated no relief)      Recent Visits Date Type Provider Dept  10/19/19 Office Visit Milinda Pointer, MD [facility]  Showing recent visits within past 90 days and meeting all other requirements Today's Visits Date Type Provider Dept  11/02/19 Procedure visit Milinda Pointer, MD [facility]  Showing today's visits and meeting all other requirements Future Appointments Date Type Provider Dept  11/18/19 Appointment Milinda Pointer, MD [facility]  Showing future appointments within next 90 days and meeting all other requirements  Disposition: Discharge home  Discharge (Date  Time): 11/02/2019; 0948 hrs.   Primary Care Physician: [name], MD Location: [facility] Note by: Gaspar Cola, MD Date: 11/02/2019; Time: 12:50 PM  Disclaimer:  Medicine is not an Chief Strategy Officer. The only guarantee in medicine is that nothing is guaranteed. It is important to note that the decision to proceed with this intervention was based on the information collected from the  patient. The Data and conclusions were drawn from the patient's questionnaire, the interview, and the physical examination. Because the information was provided in large part by the patient, it cannot be guaranteed that it has not been purposely or unconsciously manipulated. Every effort has been made to obtain as much relevant data as possible for this evaluation. It is important to note that the conclusions that lead to this procedure are derived in large part from the available data. Always take into account that the treatment will also be dependent on availability of resources and existing treatment guidelines, considered by other Pain Management Practitioners as being common knowledge and practice, at the time of the intervention. For Medico-Legal purposes, it is also important to point out that variation in procedural techniques and pharmacological choices are the acceptable norm. The indications, contraindications, technique, and results of the above procedure should only be interpreted and judged by a Board-Certified Interventional Pain Specialist with extensive familiarity and expertise in the same exact procedure and technique.

## 2019-11-01 NOTE — Patient Instructions (Signed)

## 2019-11-02 ENCOUNTER — Ambulatory Visit (HOSPITAL_BASED_OUTPATIENT_CLINIC_OR_DEPARTMENT_OTHER): Payer: Medicare Other | Admitting: Pain Medicine

## 2019-11-02 ENCOUNTER — Other Ambulatory Visit: Payer: Self-pay

## 2019-11-02 ENCOUNTER — Ambulatory Visit
Admission: RE | Admit: 2019-11-02 | Discharge: 2019-11-02 | Disposition: A | Discharge status: Home / Self Care | Payer: Medicare Other | Source: Ambulatory Visit | Attending: Pain Medicine | Admitting: Pain Medicine

## 2019-11-02 ENCOUNTER — Encounter: Payer: Self-pay | Admitting: Pain Medicine

## 2019-11-02 VITALS — BP 120/72 | HR 73 | Temp 98.2°F | Resp 15 | Ht 68.0 in | Wt 140.0 lb

## 2019-11-02 DIAGNOSIS — G893 Neoplasm related pain (acute) (chronic): Secondary | ICD-10-CM | POA: Insufficient documentation

## 2019-11-02 DIAGNOSIS — C9110 Chronic lymphocytic leukemia of B-cell type not having achieved remission: Secondary | ICD-10-CM | POA: Diagnosis present

## 2019-11-02 DIAGNOSIS — R1013 Epigastric pain: Secondary | ICD-10-CM | POA: Insufficient documentation

## 2019-11-02 DIAGNOSIS — G8929 Other chronic pain: Secondary | ICD-10-CM

## 2019-11-02 DIAGNOSIS — R109 Unspecified abdominal pain: Secondary | ICD-10-CM | POA: Insufficient documentation

## 2019-11-02 DIAGNOSIS — Z7901 Long term (current) use of anticoagulants: Secondary | ICD-10-CM | POA: Diagnosis present

## 2019-11-02 MED ORDER — MIDAZOLAM HCL 5 MG/5ML IJ SOLN
1.0000 mg | INTRAMUSCULAR | Status: DC | PRN
  Administered 2019-11-02: 3 mg via INTRAVENOUS
  Filled 2019-11-02: qty 5

## 2019-11-02 MED ORDER — DEXAMETHASONE SODIUM PHOSPHATE 10 MG/ML IJ SOLN
10.0000 mg | Freq: Once | INTRAMUSCULAR | Status: AC
  Administered 2019-11-02: 10 mg
  Filled 2019-11-02: qty 1

## 2019-11-02 MED ORDER — FENTANYL CITRATE (PF) 100 MCG/2ML IJ SOLN
25.0000 ug | INTRAMUSCULAR | Status: DC | PRN
  Administered 2019-11-02: 100 ug via INTRAVENOUS
  Filled 2019-11-02: qty 2

## 2019-11-02 MED ORDER — IOHEXOL 180 MG/ML  SOLN
10.0000 mL | Freq: Once | INTRAMUSCULAR | Status: AC
  Administered 2019-11-02: 10 mL via EPIDURAL

## 2019-11-02 MED ORDER — LIDOCAINE HCL 2 % IJ SOLN
20.0000 mL | Freq: Once | INTRAMUSCULAR | Status: AC
  Administered 2019-11-02: 400 mg

## 2019-11-02 MED ORDER — LACTATED RINGERS IV SOLN
1000.0000 mL | Freq: Once | INTRAVENOUS | Status: AC
  Administered 2019-11-02: 1000 mL via INTRAVENOUS

## 2019-11-02 MED ORDER — BUPIVACAINE-EPINEPHRINE (PF) 0.25% -1:200000 IJ SOLN
14.0000 mL | Freq: Once | INTRAMUSCULAR | Status: AC
  Administered 2019-11-02: 14 mL
  Filled 2019-11-02: qty 30

## 2019-11-02 NOTE — Progress Notes (Signed)
Safety precautions to be maintained throughout the outpatient stay will include: orient to surroundings, keep bed in low position, maintain call bell within reach at all times, provide assistance with transfer out of bed and ambulation.  

## 2019-11-03 ENCOUNTER — Telehealth: Payer: Medicare Other | Admitting: Hospice and Palliative Medicine

## 2019-11-03 ENCOUNTER — Telehealth: Payer: Self-pay | Admitting: *Deleted

## 2019-11-03 NOTE — Telephone Encounter (Signed)
No problems post procedure. 

## 2019-11-08 ENCOUNTER — Inpatient Hospital Stay: Payer: Medicare Other | Attending: Internal Medicine

## 2019-11-08 ENCOUNTER — Other Ambulatory Visit: Payer: Self-pay

## 2019-11-08 ENCOUNTER — Ambulatory Visit: Payer: Medicare Other | Admitting: Pain Medicine

## 2019-11-08 DIAGNOSIS — N183 Chronic kidney disease, stage 3 unspecified: Secondary | ICD-10-CM | POA: Insufficient documentation

## 2019-11-08 DIAGNOSIS — Z79899 Other long term (current) drug therapy: Secondary | ICD-10-CM | POA: Insufficient documentation

## 2019-11-08 DIAGNOSIS — D631 Anemia in chronic kidney disease: Secondary | ICD-10-CM | POA: Insufficient documentation

## 2019-11-08 DIAGNOSIS — C9110 Chronic lymphocytic leukemia of B-cell type not having achieved remission: Secondary | ICD-10-CM | POA: Insufficient documentation

## 2019-11-08 DIAGNOSIS — Z5112 Encounter for antineoplastic immunotherapy: Secondary | ICD-10-CM | POA: Insufficient documentation

## 2019-11-08 NOTE — Progress Notes (Signed)
Nutrition Follow-up:  Patient with stage IV CLL.  Patient receiving gazyva.  Noted met with pain management.   Spoke with patient via phone for nutrition follow-up.  Appetite continues to be fair.  Reports abdominal pain is not better after "procedure."  Reports that he is eating what he can.  Yesterday ate peas, chicken, rice. Drinking about 2 ensure per day.  Constipation better with medications.     Medications: reviewed  Labs: reviewed  Anthropometrics:   Weight 140 lb on 7/6 decreased from 144 lb on 5/17 143 lb on 4/5  NUTRITION DIAGNOSIS: Inadequate oral intake continues   INTERVENTION:  Will provide additional case of ensure enlive to patient to pick up on 7/13.  Continue oral nutrition supplements. Patient to consume high calorie, high protein foods.  Patient has contact information    MONITORING, EVALUATION, GOAL: weight trends, intake   NEXT VISIT: August 16 phone f/u  Britney Captain B. Zenia Resides, [place], [place] Registered Dietitian [phone] (pager)

## 2019-11-09 ENCOUNTER — Inpatient Hospital Stay: Payer: Medicare Other

## 2019-11-09 ENCOUNTER — Other Ambulatory Visit: Payer: Self-pay | Admitting: Internal Medicine

## 2019-11-09 ENCOUNTER — Inpatient Hospital Stay (HOSPITAL_BASED_OUTPATIENT_CLINIC_OR_DEPARTMENT_OTHER): Payer: Medicare Other | Admitting: Internal Medicine

## 2019-11-09 VITALS — BP 145/70 | HR 58 | Temp 97.6°F | Resp 16

## 2019-11-09 DIAGNOSIS — E538 Deficiency of other specified B group vitamins: Secondary | ICD-10-CM | POA: Diagnosis not present

## 2019-11-09 DIAGNOSIS — C9110 Chronic lymphocytic leukemia of B-cell type not having achieved remission: Secondary | ICD-10-CM

## 2019-11-09 DIAGNOSIS — Z79899 Other long term (current) drug therapy: Secondary | ICD-10-CM | POA: Diagnosis not present

## 2019-11-09 DIAGNOSIS — C9112 Chronic lymphocytic leukemia of B-cell type in relapse: Secondary | ICD-10-CM

## 2019-11-09 DIAGNOSIS — I70219 Atherosclerosis of native arteries of extremities with intermittent claudication, unspecified extremity: Secondary | ICD-10-CM

## 2019-11-09 DIAGNOSIS — C8300 Small cell B-cell lymphoma, unspecified site: Secondary | ICD-10-CM | POA: Diagnosis not present

## 2019-11-09 DIAGNOSIS — N183 Chronic kidney disease, stage 3 unspecified: Secondary | ICD-10-CM | POA: Diagnosis not present

## 2019-11-09 DIAGNOSIS — D631 Anemia in chronic kidney disease: Secondary | ICD-10-CM | POA: Diagnosis not present

## 2019-11-09 DIAGNOSIS — C9192 Lymphoid leukemia, unspecified, in relapse: Secondary | ICD-10-CM

## 2019-11-09 DIAGNOSIS — Z5112 Encounter for antineoplastic immunotherapy: Secondary | ICD-10-CM | POA: Diagnosis present

## 2019-11-09 LAB — CBC WITH DIFFERENTIAL/PLATELET
Abs Immature Granulocytes: 0.04 10*3/uL (ref 0.00–0.07)
Basophils Absolute: 0 10*3/uL (ref 0.0–0.1)
Basophils Relative: 0 %
Eosinophils Absolute: 0 10*3/uL (ref 0.0–0.5)
Eosinophils Relative: 0 %
HCT: 30 % — ABNORMAL LOW (ref 39.0–52.0)
Hemoglobin: 9.5 g/dL — ABNORMAL LOW (ref 13.0–17.0)
Immature Granulocytes: 1 %
Lymphocytes Relative: 27 %
Lymphs Abs: 2 10*3/uL (ref 0.7–4.0)
MCH: 32.6 pg (ref 26.0–34.0)
MCHC: 31.7 g/dL (ref 30.0–36.0)
MCV: 103.1 fL — ABNORMAL HIGH (ref 80.0–100.0)
Monocytes Absolute: 0.8 10*3/uL (ref 0.1–1.0)
Monocytes Relative: 10 %
Neutro Abs: 4.7 10*3/uL (ref 1.7–7.7)
Neutrophils Relative %: 62 %
Platelets: 182 10*3/uL (ref 150–400)
RBC: 2.91 MIL/uL — ABNORMAL LOW (ref 4.22–5.81)
RDW: 17 % — ABNORMAL HIGH (ref 11.5–15.5)
WBC: 7.5 10*3/uL (ref 4.0–10.5)
nRBC: 0 % (ref 0.0–0.2)

## 2019-11-09 LAB — COMPREHENSIVE METABOLIC PANEL
ALT: 12 U/L (ref 0–44)
AST: 9 U/L — ABNORMAL LOW (ref 15–41)
Albumin: 4.3 g/dL (ref 3.5–5.0)
Alkaline Phosphatase: 55 U/L (ref 38–126)
Anion gap: 7 (ref 5–15)
BUN: 31 mg/dL — ABNORMAL HIGH (ref 8–23)
CO2: 28 mmol/L (ref 22–32)
Calcium: 9.2 mg/dL (ref 8.9–10.3)
Chloride: 103 mmol/L (ref 98–111)
Creatinine, Ser: 1.14 mg/dL (ref 0.61–1.24)
GFR calc Af Amer: >60 mL/min (ref >60)
GFR calc non Af Amer: >60 mL/min (ref >60)
Glucose, Bld: 146 mg/dL — ABNORMAL HIGH (ref 70–99)
Potassium: 4.5 mmol/L (ref 3.5–5.1)
Sodium: 138 mmol/L (ref 135–145)
Total Bilirubin: 0.4 mg/dL (ref 0.3–1.2)
Total Protein: 7 g/dL (ref 6.5–8.1)

## 2019-11-09 LAB — LACTATE DEHYDROGENASE: LDH: 101 U/L (ref 98–192)

## 2019-11-09 LAB — SAMPLE TO BLOOD BANK

## 2019-11-09 MED ORDER — DARBEPOETIN ALFA 300 MCG/0.6ML IJ SOSY
300.0000 ug | PREFILLED_SYRINGE | Freq: Once | INTRAMUSCULAR | Status: AC
  Administered 2019-11-09: 300 ug via SUBCUTANEOUS
  Filled 2019-11-09: qty 0.6

## 2019-11-09 MED ORDER — FOLIC ACID 1 MG PO TABS: 1.0000 mg | ORAL_TABLET | Freq: Every day | ORAL | 1 refills | Status: AC

## 2019-11-09 MED ORDER — MORPHINE SULFATE ER 15 MG PO TBCR: 15.0000 mg | EXTENDED_RELEASE_TABLET | Freq: Two times a day (BID) | ORAL | 0 refills | Status: DC

## 2019-11-09 MED ORDER — SODIUM CHLORIDE 0.9 % IV SOLN
1000.0000 mg | Freq: Once | INTRAVENOUS | Status: AC
  Administered 2019-11-09: 1000 mg via INTRAVENOUS
  Filled 2019-11-09: qty 40

## 2019-11-09 MED ORDER — SODIUM CHLORIDE 0.9 % IV SOLN
Freq: Once | INTRAVENOUS | Status: AC
  Filled 2019-11-09: qty 250

## 2019-11-09 MED ORDER — OXYCODONE-ACETAMINOPHEN 5-325 MG PO TABS: 1.0000 | ORAL_TABLET | Freq: Four times a day (QID) | ORAL | 0 refills | Status: DC | PRN

## 2019-11-09 MED ORDER — MONTELUKAST SODIUM 10 MG PO TABS
10.0000 mg | ORAL_TABLET | Freq: Once | ORAL | Status: AC
  Administered 2019-11-09: 10 mg via ORAL
  Filled 2019-11-09: qty 1

## 2019-11-09 MED ORDER — SODIUM CHLORIDE 0.9 % IV SOLN
20.0000 mg | Freq: Once | INTRAVENOUS | Status: AC
  Administered 2019-11-09: 20 mg via INTRAVENOUS
  Filled 2019-11-09: qty 20

## 2019-11-09 MED ORDER — ACETAMINOPHEN 325 MG PO TABS
650.0000 mg | ORAL_TABLET | Freq: Once | ORAL | Status: AC
  Administered 2019-11-09: 650 mg via ORAL
  Filled 2019-11-09: qty 2

## 2019-11-09 MED ORDER — ACYCLOVIR 400 MG PO TABS: ORAL_TABLET | ORAL | 3 refills | Status: DC

## 2019-11-09 MED ORDER — HEPARIN SOD (PORK) LOCK FLUSH 100 UNIT/ML IV SOLN
500.0000 [IU] | Freq: Once | INTRAVENOUS | Status: DC | PRN
  Filled 2019-11-09: qty 5

## 2019-11-09 MED ORDER — DIPHENHYDRAMINE HCL 50 MG/ML IJ SOLN
50.0000 mg | Freq: Once | INTRAMUSCULAR | Status: AC
  Administered 2019-11-09: 50 mg via INTRAVENOUS
  Filled 2019-11-09: qty 1

## 2019-11-09 NOTE — Assessment & Plan Note (Addendum)
#   CLL/SLL- relapsed most recently on ibrutinib; FEB 2021- CT Ab/Pelvis-CT scan chest and pelvis shows significant progression of axillary/mediastinal/abdominal/pelvic-however the largest lesion approximately inch in size. MARCH 2021- ~ 2.5 cm RP LN; multiple smaller LN.  Currently on Gazyva; STABLE.   #Proceed with Gazyva #5; today. Labs today reviewed;  acceptable for treatment today;   #Chronic abdominal pain-unclear etiology-stable patient's urine porphyrins were slightly abnormal ; plasma fractionated porphyrin- Normal. continue narcotics/follow-up with pain management- STABLE; scripts refilled.    #Hyperkalemia/CKD-III-secondary renal insufficiency/creatinine 1.3- [baseline 1.4; Dr.Lateef].STABLE. .   # Anemia-hemoglobin 9.5;Secondary to CKD versus progressive leukemia stable; . Aranesp today.   # DISPOSITION:  # AranespDyann Kief; today # 4 week- MD; labs- cbc/cmpLDH;iron studies/ferritin; HOLD tube;possible aranesp; Gazyva-Dr.B

## 2019-11-09 NOTE — Progress Notes (Signed)
[facility] [place] OFFICE PROGRESS NOTE  Patient Care Team: [name], MD as PCP - General (Internal Medicine) Cammie Sickle, MD as Consulting Physician (Internal Medicine)  Cancer Staging No matching staging information was found for the patient.   Oncology History Overview Note  # 2006- CLL STAGE IV; MAY 2011- WBC- 57K;Platelets-99;Hb-12/CT Bulky LN; BMBx- 80% Invol; del 11; START Benda-Ritux x4 [finished Sep 2011];   # July 2015-Progression; Sep 2015-START ibrutinib; CT scan DEC 2015- Improvement LN; Cont Ibrutinib '140mg'$ /d; NOV 2016 CT- 1-2CM LN [mild progression compared to Dec 2015];NOV 2016- FISH peripheral blood- NO MUTATIONS/CD-38 Positive; NOV 7th- CONT IBRUTINIB 2 pills/day; CT AUG 2017- STABLE;  DEC 6th PET- Mild RP LN/ Retrocrural LN  # OFF ibrutinib [? intol]- sep 2019- Jan 16th 2020; Re-start Ibrutinib; September 2020-stop ibrutinib [poor tolerance/worsening anemia]  # Jan 16th 2020- start aranesp; HOLD while on [place].   # MARCH 11th 2021[name]   # DEC 2019- PAIN CONTRACT  # PALLIATIVE CARE- 06/21/2019-  # October 2019-bone marrow biopsy [worsening anemia]-question dyserythropoietic changes/small clone of CLL;  # [facility] HEM- NEG.  SA skin infection [Oct 2016] s/p clinda -------------------------------------------------------    DIAGNOSIS: CLL  STAGE:   IV      ;GOALS: palliative  CURRENT/MOST RECENT THERAPY : Gazyva [C]   CLL (chronic lymphocytic leukemia) ([place])  07/08/2019 -  Chemotherapy   The patient had obinutuzumab (GAZYVA) 100 mg in sodium chloride 0.9 % 100 mL (0.9615 mg/mL) chemo infusion, 100 mg, Intravenous, Once, 5 of 6 cycles Administration: 100 mg (07/08/2019), 900 mg (07/09/2019), 1,000 mg (07/26/2019), 1,000 mg (08/16/2019), 1,000 mg (08/02/2019), 1,000 mg (09/13/2019), 1,000 mg (10/11/2019)  for chemotherapy treatment.      INTERVAL HISTORY:  James [name] 69 y.o.  male pleasant patient above  progressive CLL and chronic abdominal pain of unclear etiology; anemia- ckd/hyperkalemia currently James [name] is here for follow-up.  Patient had cycle #4 Gazyva approximately 4 weeks ago.  Patient received 1 unit of PRBC transfusion about 2 weeks ago for hemoglobin 7.8.  Continues to have intermittent abdominal pain for which she has been evaluated by pain physician.  Continues to be a narcotic pain prescription.  Pain is not getting any worse or better.  No further weight loss.   Review of Systems  Constitutional: Positive for malaise/fatigue and weight loss. Negative for chills, diaphoresis and fever.  HENT: Negative for nosebleeds and sore throat.   Eyes: Negative for double vision.  Respiratory: Negative for cough, hemoptysis, sputum production, shortness of breath and wheezing.   Cardiovascular: Negative for chest pain, palpitations, orthopnea and leg swelling.  Gastrointestinal: Positive for abdominal pain, constipation and nausea. Negative for blood in stool, diarrhea, heartburn, melena and vomiting.  Genitourinary: Negative for dysuria, frequency and urgency.  Skin: Negative.  Negative for itching and rash.  Neurological: Negative for dizziness, tingling, focal weakness, weakness and headaches.  Endo/Heme/Allergies: Does not bruise/bleed easily.  Psychiatric/Behavioral: Negative for depression. The patient is not nervous/anxious and does not have insomnia.       PAST MEDICAL HISTORY :  Past Medical History:  Diagnosis Date  . CLL (chronic lymphocytic leukemia) ([place])   . Constipation   . Depression   . Diabetes mellitus without complication ([place])   . Hematuria   . Hyperlipidemia   . Hypertension   . Therapeutic opioid induced constipation     PAST SURGICAL HISTORY :   Past Surgical History:  Procedure Laterality Date  . CATARACT EXTRACTION W/  INTRAOCULAR LENS IMPLANT Bilateral   . CHOLECYSTECTOMY  1983  . COLONOSCOPY WITH PROPOFOL N/A 10/27/2017   Procedure: COLONOSCOPY  WITH PROPOFOL;  Surgeon: Manya Silvas, MD;  Location[facility] ENDOSCOPY;  Service: Endoscopy;  Laterality: N/A;  . ESOPHAGOGASTRODUODENOSCOPY (EGD) WITH PROPOFOL N/A 11/20/2016   Procedure: ESOPHAGOGASTRODUODENOSCOPY (EGD) WITH PROPOFOL;  Surgeon: Manya Silvas, MD;  Location: [facility] ENDOSCOPY;  Service: Endoscopy;  Laterality: N/A;  . ESOPHAGOGASTRODUODENOSCOPY (EGD) WITH PROPOFOL N/A 10/27/2017   Procedure: ESOPHAGOGASTRODUODENOSCOPY (EGD) WITH PROPOFOL;  Surgeon: Manya Silvas, MD;  Location[facility] ENDOSCOPY;  Service: Endoscopy;  Laterality: N/A;  . LOWER EXTREMITY ANGIOGRAPHY Right 05/19/2017   Procedure: LOWER EXTREMITY ANGIOGRAPHY;  Surgeon: Algernon Huxley, MD;  Location: [facility] CV LAB;  Service: Cardiovascular;  Laterality: Right;  . LOWER EXTREMITY ANGIOGRAPHY Left 08/10/2018   Procedure: LOWER EXTREMITY ANGIOGRAPHY;  Surgeon: Algernon Huxley, MD;  Location: [facility] CV LAB;  Service: Cardiovascular;  Laterality: Left;    FAMILY HISTORY :   Family History  Problem Relation Age of Onset  . Hypertension Sister     SOCIAL HISTORY:   Social History   Tobacco Use  . Smoking status: Current Every Day Smoker    Packs/day: 0.50    Years: 47.00    Pack years: 23.50    Types: Cigarettes  . Smokeless tobacco: Never Used  Vaping Use  . Vaping Use: Never used  Substance Use Topics  . Alcohol use: Yes    Alcohol/week: 1.0 standard drink    Types: 1 Glasses of wine per week    Comment:  almost none in last 6 months  . Drug use: No    ALLERGIES:  has No Known Allergies.  MEDICATIONS:  Current Outpatient Medications  Medication Sig Dispense Refill  . acyclovir (ZOVIRAX) 400 MG tablet One pill a day [to prevent shingles] 30 tablet 3  . aspirin EC 81 MG tablet Take 81 mg by mouth daily.    Marland Kitchen atorvastatin (LIPITOR) 10 MG tablet Take 1 tablet (10 mg total) by mouth daily. 30 tablet 11  . Calcium Carb-Cholecalciferol (CALCIUM PLUS D3 ABSORBABLE) [phone] MG-UNIT CAPS Take 1  capsule by mouth 2 (two) times daily with a meal. 60 capsule 5  . Cholecalciferol (VITAMIN D3) 125 MCG (5000 UT) CAPS Take 1 capsule (5,000 Units total) by mouth daily with breakfast. Take along with calcium and magnesium. 90 capsule 1  . clopidogrel (PLAVIX) 75 MG tablet Take 1 tablet by mouth once daily 90 tablet 0  . dexamethasone (DECADRON) 4 MG tablet Start 2 days prior to infusion; Take for 2 days. Do not take on the day of infusion. 60 tablet 3  . Ensure (ENSURE) Take 237 mLs by mouth.    . iron polysaccharides (NU-IRON) 150 MG capsule Take 1 capsule (150 mg total) by mouth daily. (Patient taking differently: Take 150 mg by mouth every other day. ) 30 capsule 1  . metFORMIN (GLUCOPHAGE) 1000 MG tablet Take 1,000 mg by mouth daily with breakfast.     . montelukast (SINGULAIR) 10 MG tablet Take 1 tablet (10 mg total) by mouth at bedtime. Start 2 days prior to infusion. Take it for 4 days. 60 tablet 0  . morphine (MS CONTIN) 15 MG 12 hr tablet Take 1 tablet (15 mg total) by mouth every 12 (twelve) hours. 30 tablet 0  . naloxegol oxalate (MOVANTIK) 25 MG TABS tablet Take 1 tablet (25 mg total) by mouth daily. 30 tablet 0  . oxyCODONE-acetaminophen (PERCOCET/ROXICET) 5-325  MG tablet Take 1 tablet by mouth every 6 (six) hours as needed for severe pain. 90 tablet 0  . folic acid (FOLVITE) 1 MG tablet Take 1 tablet (1 mg total) by mouth daily. 90 tablet 1   No current facility-administered medications for this visit.   Facility-Administered Medications Ordered in Other Visits  Medication Dose Route Frequency Provider Last Rate Last Admin  . Darbepoetin Alfa (ARANESP) injection 300 mcg  300 mcg Subcutaneous Once Charlaine Dalton R, MD      . dexamethasone (DECADRON) 20 mg in sodium chloride 0.9 % 50 mL IVPB  20 mg Intravenous Once Charlaine Dalton R, MD      . heparin lock flush 100 unit/mL  500 Units Intracatheter Once PRN Cammie Sickle, MD      . obinutuzumab (GAZYVA) 1,000 mg in  sodium chloride 0.9 % 250 mL (3.4483 mg/mL) chemo infusion  1,000 mg Intravenous Once Cammie Sickle, MD        PHYSICAL EXAMINATION: ECOG PERFORMANCE STATUS: 1 - Symptomatic but completely ambulatory  BP 134/64 (BP Location: Left Arm, Patient Position: Sitting)   Pulse (!) 57   Temp (!) 97.2 F (36.2 C) (Tympanic)   Resp 20   Ht 5' 8"  (1.727 m)   Wt 142 lb (64.4 kg)   BMI 21.59 kg/m   Filed Weights   11/09/19 0832  Weight: 142 lb (64.4 kg)    Physical Exam Constitutional:      Comments: He is alone.  Appears pale.  HENT:     Head: Normocephalic and atraumatic.     Mouth/Throat:     Pharynx: No oropharyngeal exudate.  Eyes:     Pupils: Pupils are equal, round, and reactive to light.  Cardiovascular:     Rate and Rhythm: Normal rate and regular rhythm.  Pulmonary:     Effort: No respiratory distress.     Breath sounds: No wheezing.  Abdominal:     General: Bowel sounds are normal. There is no distension.     Palpations: Abdomen is soft. There is no mass.     Tenderness: There is no abdominal tenderness. There is no guarding or rebound.  Musculoskeletal:        General: No tenderness. Normal range of motion.     Cervical back: Normal range of motion and neck supple.  Skin:    General: Skin is warm.     Coloration: Skin is pale.     Comments: Multiple bruises noted in bilateral upper extremity.  Neurological:     Mental Status: He is alert and oriented to person, place, and time.  Psychiatric:        Mood and Affect: Affect normal.       LABORATORY DATA:  I have reviewed the data as listed    Component Value Date/Time   NA 138 11/09/2019 0800   NA 142 05/03/2014 1139   K 4.5 11/09/2019 0800   K 4.7 05/03/2014 1139   CL 103 11/09/2019 0800   CL 110 (H) 05/03/2014 1139   CO2 28 11/09/2019 0800   CO2 24 05/03/2014 1139   GLUCOSE 146 (H) 11/09/2019 0800   GLUCOSE 98 05/03/2014 1139   BUN 31 (H) 11/09/2019 0800   BUN 20 (H) 05/03/2014 1139    CREATININE 1.14 11/09/2019 0800   CREATININE 1.22 08/09/2014 1122   CALCIUM 9.2 11/09/2019 0800   CALCIUM 8.6 05/03/2014 1139   PROT 7.0 11/09/2019 0800   PROT 7.5 05/03/2014 1139   ALBUMIN  4.3 11/09/2019 0800   ALBUMIN 4.1 05/03/2014 1139   AST 9 (L) 11/09/2019 0800   AST 15 05/03/2014 1139   ALT 12 11/09/2019 0800   ALT 26 05/03/2014 1139   ALKPHOS 55 11/09/2019 0800   ALKPHOS 94 05/03/2014 1139   BILITOT 0.4 11/09/2019 0800   BILITOT 0.5 05/03/2014 1139   GFRNONAA >60 11/09/2019 0800   GFRNONAA >60 08/09/2014 1122   GFRAA >60 11/09/2019 0800   GFRAA >60 08/09/2014 1122    No results found for: SPEP, UPEP  Lab Results  Component Value Date   WBC 7.5 11/09/2019   NEUTROABS 4.7 11/09/2019   HGB 9.5 (L) 11/09/2019   HCT 30.0 (L) 11/09/2019   MCV 103.1 (H) 11/09/2019   PLT 182 11/09/2019      Chemistry      Component Value Date/Time   NA 138 11/09/2019 0800   NA 142 05/03/2014 1139   K 4.5 11/09/2019 0800   K 4.7 05/03/2014 1139   CL 103 11/09/2019 0800   CL 110 (H) 05/03/2014 1139   CO2 28 11/09/2019 0800   CO2 24 05/03/2014 1139   BUN 31 (H) 11/09/2019 0800   BUN 20 (H) 05/03/2014 1139   CREATININE 1.14 11/09/2019 0800   CREATININE 1.22 08/09/2014 1122      Component Value Date/Time   CALCIUM 9.2 11/09/2019 0800   CALCIUM 8.6 05/03/2014 1139   ALKPHOS 55 11/09/2019 0800   ALKPHOS 94 05/03/2014 1139   AST 9 (L) 11/09/2019 0800   AST 15 05/03/2014 1139   ALT 12 11/09/2019 0800   ALT 26 05/03/2014 1139   BILITOT 0.4 11/09/2019 0800   BILITOT 0.5 05/03/2014 1139       RADIOGRAPHIC STUDIES: I have personally reviewed the radiological images as listed and agreed with the findings in the report. No results found.   ASSESSMENT & PLAN:  CLL (chronic lymphocytic leukemia) ([place]) # CLL/SLL- relapsed most recently on ibrutinib; FEB 2021- CT Ab/Pelvis-CT scan chest and pelvis shows significant progression of axillary/mediastinal/abdominal/pelvic-however the  largest lesion approximately inch in size. MARCH 2021- ~ 2.5 cm RP LN; multiple smaller LN.  Currently on Gazyva; STABLE.   #Proceed with Gazyva #5; today. Labs today reviewed;  acceptable for treatment today;   #Chronic abdominal pain-unclear etiology-stable patient's urine porphyrins were slightly abnormal ; plasma fractionated porphyrin- Normal. continue narcotics/follow-up with pain management- STABLE; scripts refilled.    #Hyperkalemia/CKD-III-secondary renal insufficiency/creatinine 1.3- [baseline 1.4; Dr.Lateef].STABLE. .   # Anemia-hemoglobin 9.5;Secondary to CKD versus progressive leukemia stable; . Aranesp today.   # DISPOSITION:  # AranespDyann [name]; today # 4 week- MD; labs- cbc/cmpLDH;iron studies/ferritin; HOLD tube;possible aranesp; Gazyva-Dr.B   Orders Placed This Encounter  Procedures  . CBC with Differential    Standing Status:   Future    Standing Expiration Date:   11/08/2020  . Comprehensive metabolic panel    Standing Status:   Future    Standing Expiration Date:   11/08/2020  . Lactate dehydrogenase    Standing Status:   Future    Standing Expiration Date:   11/08/2020  . Ferritin    Standing Status:   Future    Standing Expiration Date:   11/08/2020  . Iron and TIBC    Standing Status:   Future    Standing Expiration Date:   11/08/2020  . Hold Tube- Blood Bank    Standing Status:   Future    Standing Expiration Date:   11/08/2020   All questions were answered.  The patient knows to call the clinic with any problems, questions or concerns.      Cammie Sickle, MD 11/09/2019 9:45 AM

## 2019-11-18 ENCOUNTER — Ambulatory Visit: Payer: Medicare Other | Attending: Pain Medicine | Admitting: Pain Medicine

## 2019-11-18 ENCOUNTER — Encounter: Payer: Self-pay | Admitting: Pain Medicine

## 2019-11-18 ENCOUNTER — Other Ambulatory Visit: Payer: Self-pay

## 2019-11-18 VITALS — BP 157/83 | HR 75 | Temp 98.2°F | Resp 16 | Ht 68.0 in | Wt 153.0 lb

## 2019-11-18 DIAGNOSIS — R109 Unspecified abdominal pain: Secondary | ICD-10-CM | POA: Diagnosis not present

## 2019-11-18 DIAGNOSIS — Z7901 Long term (current) use of anticoagulants: Secondary | ICD-10-CM | POA: Diagnosis present

## 2019-11-18 DIAGNOSIS — G8929 Other chronic pain: Secondary | ICD-10-CM | POA: Diagnosis present

## 2019-11-18 DIAGNOSIS — R1013 Epigastric pain: Secondary | ICD-10-CM

## 2019-11-18 DIAGNOSIS — G893 Neoplasm related pain (acute) (chronic): Secondary | ICD-10-CM | POA: Insufficient documentation

## 2019-11-18 DIAGNOSIS — G894 Chronic pain syndrome: Secondary | ICD-10-CM | POA: Insufficient documentation

## 2019-11-18 DIAGNOSIS — I70219 Atherosclerosis of native arteries of extremities with intermittent claudication, unspecified extremity: Secondary | ICD-10-CM

## 2019-11-18 DIAGNOSIS — C9110 Chronic lymphocytic leukemia of B-cell type not having achieved remission: Secondary | ICD-10-CM | POA: Insufficient documentation

## 2019-11-18 DIAGNOSIS — C8300 Small cell B-cell lymphoma, unspecified site: Secondary | ICD-10-CM | POA: Insufficient documentation

## 2019-11-18 NOTE — Patient Instructions (Addendum)
Intrathecal Pain Pump Information An intrathecal pain pump is a pump that sends medicine into your spinal canal through a thin tube (catheter). You may need an intrathecal pain pump if you have long-term pain that cannot be managed in other ways. In some cases, this pump can be used to deliver medicine that treats long-term muscle spasms (spasticity).  If you are using the pump for only a short time, the pump may be outside your body.  For long-term use, the pump and the catheter are implanted inside your body. The pump is a small round disc and is usually placed under the skin of the abdomen. The catheter will run under your skin from your abdomen to your back. Your health care provider will program the pump to deliver pain medicine into your spinal canal continuously or at certain intervals. You may have a handheld device that allows you to give yourself a dose of pain medicine when needed. The pump runs on a battery. The battery will last 7-10 years, and then the pump will need to be replaced. What are the benefits of an intrathecal pain pump? Opioid pain medicines are strong medicines that are often used to control long-term and severe pain. When these medicines are placed in the fluid space (intrathecal space) that surrounds your spinal cord and brain, the medicine will block the pain signals that come from your body. This type of pump may be an option if you have long-term pain that cannot be managed with opioids that are taken by mouth or by injection. The pump may allow you to have better control of your pain with less medicine. What are the risks of an intrathecal pain pump? Before you can have the pump placed, you will need to go through a pain control trial. Medicine will be placed into your intrathecal space to see if it controls your pain and to see how well you tolerate the medicine. You will need to have a surgical procedure to place the catheter and the pain pump. This surgery has some  risks. There are also risks to long-term use of opioids. Risks of pain pump implantation and use include:  Infection.  Bleeding.  Failure of the pump or the catheter.  Leaking of fluid from your spinal canal (cerebrospinal fluid leak).  A growth around the tip of the catheter inside your spinal canal (granuloma). This can put pressure on your spine.  A need to remove the pump if problems develop.  A need to replace the pump after 7-10 years. Risks of long-term opioid use include:  A breathing problem that prevents you from getting enough oxygen (respiratory depression).  Physical and psychological dependence.  Constipation or difficulty passing urine.  Nausea and vomiting.  Mental changes, including feeling high, dizzy, drowsy, confused, depressed, or anxious. How do I use the pump? Your health care provider will decide what type of pump is best for you. If you have a pump that gives a dose of medicine on demand, you may have a handheld device to trigger the release of your medicine. You will need to see your health care provider often to make sure the pump is programmed correctly for your pain. This programming may need to be changed over time. You will also need to have the pump resupplied with medicine. Your health care provider may do this at your routine visits. You should also know that:  Electronic devices or scans will not damage your pump.  You will need a device identification card  to pass through security gates.  Your device will have an alarm to warn you of a malfunction or low battery power. The alarm will sound if you need a medicine refill. Contact your health care provider if your alarm sounds. Summary  An intrathecal pain pump is a pump that sends medicine into your spinal canal through a thin tube (catheter).  The pump is a small round disc and is usually placed under the skin of the abdomen.  This type of pump may be an option if you have long-term pain  that cannot be managed with opioids that are taken by mouth or by injection.  The pump may allow you to have better control of your pain with less medicine. This information is not intended to replace advice given to you by your health care provider. Make sure you discuss any questions you have with your health care provider. Document Revised: 02/16/2018 Document Reviewed: 02/16/2018 Elsevier Patient Education  2020 [place] 7 DAYS DO NOT EAT OR DRINK FOR 6 HOURS  ____________________________________________________________________________________________  Blood Thinners  IMPORTANT NOTICE:  If you take any of these, make sure to notify the nursing staff.  Failure to do so may result in injury.  Recommended time intervals to stop and restart blood-thinners, before & after invasive procedures  Generic Name Brand Name Stop Time. Must be stopped at least this long before procedures. After procedures, wait at least this long before re-starting.  Abciximab Reopro 15 days 2 hrs  Alteplase Activase 10 days 10 days  Anagrelide Agrylin    Apixaban Eliquis 3 days 6 hrs  Cilostazol Pletal 3 days 5 hrs  Clopidogrel Plavix 7-10 days 2 hrs  Dabigatran Pradaxa 5 days 6 hrs  Dalteparin Fragmin 24 hours 4 hrs  Dipyridamole Aggrenox 11days 2 hrs  Edoxaban Lixiana; Savaysa 3 days 2 hrs  Enoxaparin  Lovenox 24 hours 4 hrs  Eptifibatide Integrillin 8 hours 2 hrs  Fondaparinux  Arixtra 72 hours 12 hrs  Prasugrel Effient 7-10 days 6 hrs  Reteplase Retavase 10 days 10 days  Rivaroxaban Xarelto 3 days 6 hrs  Ticagrelor Brilinta 5-7 days 6 hrs  Ticlopidine Ticlid 10-14 days 2 hrs  Tinzaparin Innohep 24 hours 4 hrs  Tirofiban Aggrastat 8 hours 2 hrs  Warfarin Coumadin 5 days 2 hrs   Other medications with blood-thinning effects  Product indications Generic (Brand) names Note  Cholesterol Lipitor Stop 4 days before procedure  Blood thinner (injectable) Heparin (LMW or  LMWH Heparin) Stop 24 hours before procedure  Cancer Ibrutinib (Imbruvica) Stop 7 days before procedure  Malaria/Rheumatoid Hydroxychloroquine (Plaquenil) Stop 11 days before procedure  Thrombolytics  10 days before or after procedures   Over-the-counter (OTC) Products with blood-thinning effects  Product Common names Stop Time  Aspirin > 325 mg Goody Powders, Excedrin, etc. 11 days  Aspirin ? 81 mg  7 days  Fish oil  4 days  Garlic supplements  7 days  Ginkgo biloba  36 hours  Ginseng  24 hours  NSAIDs Ibuprofen, Naprosyn, etc. 3 days  Vitamin E  4 days   ____________________________________________________________________________________________  ____________________________________________________________________________________________  Preparing for your procedure (without sedation)  Procedure appointments are limited to planned procedures: . No Prescription Refills. . No disability issues will be discussed. . No medication changes will be discussed.  Instructions: . Oral Intake: Do not eat or drink anything for at least 6 hours prior to your procedure. (Exception: Blood Pressure Medication. See below.) .  Transportation: Unless otherwise stated by your physician, you may drive yourself after the procedure. . Blood Pressure Medicine: Do not forget to take your blood pressure medicine with a sip of water the morning of the procedure. If your Diastolic (lower reading)is above 100 mmHg, elective cases will be cancelled/rescheduled. . Blood thinners: These will need to be stopped for procedures. Notify our staff if you are taking any blood thinners. Depending on which one you take, there will be specific instructions on how and when to stop it. . Diabetics on insulin: Notify the staff so that you can be scheduled 1st case in the morning. If your diabetes requires high dose insulin, take only  of your normal insulin dose the morning of the procedure and notify the staff that you have  done so. . Preventing infections: Shower with an antibacterial soap the morning of your procedure.  . Build-up your immune system: Take 1000 mg of Vitamin C with every meal (3 times a day) the day prior to your procedure. Marland Kitchen Antibiotics: Inform the staff if you have a condition or reason that requires you to take antibiotics before dental procedures. . Pregnancy: If you are pregnant, call and cancel the procedure. . Sickness: If you have a cold, fever, or any active infections, call and cancel the procedure. . Arrival: You must be in the facility at least 30 minutes prior to your scheduled procedure. . Children: Do not bring any children with you. . Dress appropriately: Bring dark clothing that you would not mind if they get stained. . Valuables: Do not bring any jewelry or valuables.  Reasons to call and reschedule or cancel your procedure: (Following these recommendations will minimize the risk of a serious complication.) . Surgeries: Avoid having procedures within 2 weeks of any surgery. (Avoid for 2 weeks before or after any surgery). . Flu Shots: Avoid having procedures within 2 weeks of a flu shots or . (Avoid for 2 weeks before or after immunizations). . Barium: Avoid having a procedure within 7-10 days after having had a radiological study involving the use of radiological contrast. (Myelograms, Barium swallow or enema study). . Heart attacks: Avoid any elective procedures or surgeries for the initial 6 months after a "Myocardial Infarction" (Heart Attack). . Blood thinners: It is imperative that you stop these medications before procedures. Let us know if you if you take any blood thinner.  . Infection: Avoid procedures during or within two weeks of an infection (including chest colds or gastrointestinal problems). Symptoms associated with infections include: Localized redness, fever, chills, night sweats or profuse sweating, burning sensation when voiding, cough, congestion, stuffiness,  runny nose, sore throat, diarrhea, nausea, vomiting, cold or Flu symptoms, recent or current infections. It is specially important if the infection is over the area that we intend to treat. Marland Kitchen Heart and lung problems: Symptoms that may suggest an active cardiopulmonary problem include: cough, chest pain, breathing difficulties or shortness of breath, dizziness, ankle swelling, uncontrolled high or unusually low blood pressure, and/or palpitations. If you are experiencing any of these symptoms, cancel your procedure and contact your primary care physician for an evaluation.  Remember:  Regular Business hours are:  Monday to Thursday 8:00 AM to 4:00 PM  Provider's Schedule: Milinda Pointer, MD:  Procedure days: Tuesday and Thursday 7:30 AM to 4:00 PM  Gillis Santa, MD:  Procedure days: Monday and Wednesday 7:30 AM to 4:00 PM ____________________________________________________________________________________________

## 2019-11-18 NOTE — Progress Notes (Signed)
Safety precautions to be maintained throughout the outpatient stay will include: orient to surroundings, keep bed in low position, maintain call bell within reach at all times, provide assistance with transfer out of bed and ambulation.  

## 2019-11-18 NOTE — Progress Notes (Signed)
PROVIDER NOTE: Information contained herein reflects review and annotations entered in association with encounter. Interpretation of such information and data should be left to medically-trained personnel. Information provided to patient can be located elsewhere in the medical record under "Patient Instructions". Document created using STT-dictation technology, any transcriptional errors that may result from process are unintentional.    Patient: James [name]  Service Category: E/M  Provider: Gaspar Cola, MD  DOB: 02/11/1951  DOS: 11/18/2019  Specialty: Interventional Pain Management  MRN: [id number]  Setting: Ambulatory outpatient  PCP: [name], MD  Type: Established Patient    Referring Provider: [name], MD  Location: Office  Delivery: Face-to-face     HPI  Reason for encounter: James [name], a 69 y.o. year old male, is here today for evaluation and management of his Chronic pain syndrome [G89.4]. James [name] primary complain today is Abdominal Pain Last encounter: Practice (11/03/2019). My last encounter with him was on 11/02/2019. Pertinent problems: James [name] has CLL (chronic lymphocytic leukemia) ([place]); Generalized abdominal pain; Malignant lymphoma, small lymphocytic ([place]); Toe cyanosis; Chronic epigastric pain (1ry area of Pain); Chronic pain syndrome; Chronic abdominal pain; Abdominal wall pain in epigastric region; Thoracic radiculitis; Right-sided low back pain without sciatica; DDD (degenerative disc disease), thoracic; DDD (degenerative disc disease), lumbosacral; Lumbar facet hypertrophy ([facility]); and Cancer-related pain on their pertinent problem list. Pain Assessment: Severity of Chronic pain is reported as a 3 /10. Location: Abdomen Upper/denies. Onset: More than a month ago. Quality: Constant. Timing: Constant. Modifying factor(s): medication, lying down. Vitals:  height is 5' 8"  (1.727 m) and weight is 153 lb (69.4 kg). His temporal temperature is  98.2 F (36.8 C). His blood pressure is 157/83 (abnormal) and his pulse is 75. His respiration is 16 and oxygen saturation is 100%.   Once again, the patient indicates that the relief of the pain that he attained lasted only a short period of time of about 3 hours.  After that, the pain returned.  This time, he indicates that he did experience some benefit that came later on, probably secondary to the steroids.  He indicates that this was a very modest improvement of about 10 to 15%.  In view of this and the fact that all of his pain is from T5 down, I believe that he would be a good candidate for an intrathecal pump.  Today I have explained to the patient what this consists of and the fact that we first do a single shot intrathecal opioid analgesic trial to see if he has any side effects or problems with the medication.  If he does not, then we can consider the permanent implant.  He understood and accepted to go ahead with the diagnostic intrathecal opioid analgesic trial.  Once we see how he responds to that, then we will decide on the permanent implant.  Today I have provided patient with written information regarding the [employer] intrathecal pump and I have personally explained to him about the trial including the possible risk and complications.  The patient was informed that one of the reasons why we do the trial is to figure out if he will develop any type urinary retention as a consequence of the injection of the opioid.  He understood and accepted.  Post-Procedure Evaluation  Procedure: Diagnostic bilateral celiac plexus block #2 under fluoroscopic guidance and IV sedation Pre-procedure pain level: 8/10 Post-procedure: 2/10 (> 50% relief)  Sedation: Sedation provided.  Effectiveness during initial hour after procedure(Ultra-Short Term Relief):  75 %.  Local anesthetic used: Long-acting (4-6 hours) Effectiveness: Defined as any analgesic benefit obtained secondary to the administration of local  anesthetics. This carries significant diagnostic value as to the etiological location, or anatomical origin, of the pain. Duration of benefit is expected to coincide with the duration of the local anesthetic used.  Effectiveness during initial 4-6 hours after procedure(Short-Term Relief): 0 %.  Long-term benefit: Defined as any relief past the pharmacologic duration of the local anesthetics.  Effectiveness past the initial 6 hours after procedure(Long-Term Relief): 0 %.  Current benefits: Defined as benefit that persist at this time.   Analgesia:  <50% better Function: Back to baseline ROM: Back to baseline  Pharmacotherapy Assessment   Analgesic: No opioid analgesics prescribed by our practice. Oxycodone/APAP 5/325, 1 tab PO q 8 hrs (15 mg/day of oxycodone IR)  Highest recorded MME/day: 22.5 mg/day MME/day: 22.5 mg/day   Monitoring: [place] PMP: PDMP reviewed during this encounter.       Pharmacotherapy: No side-effects or adverse reactions reported. Compliance: No problems identified. Effectiveness: Clinically acceptable.  Landis Martins, RN  11/18/2019 11:17 AM  Sign when Signing Visit Safety precautions to be maintained throughout the outpatient stay will include: orient to surroundings, keep bed in low position, maintain call bell within reach at all times, provide assistance with transfer out of bed and ambulation.     UDS: No results found for: SUMMARY   ROS  Constitutional: Denies any fever or chills Gastrointestinal: No reported hemesis, hematochezia, vomiting, or acute GI distress Musculoskeletal: Denies any acute onset joint swelling, redness, loss of ROM, or weakness Neurological: No reported episodes of acute onset apraxia, aphasia, dysarthria, agnosia, amnesia, paralysis, loss of coordination, or loss of consciousness  Medication Review  Calcium Plus D3 Absorbable, Ensure, Vitamin D3, acyclovir, aspirin EC, atorvastatin, clopidogrel, dexamethasone, folic acid, iron  polysaccharides, lactulose, metFORMIN, montelukast, morphine, naloxegol oxalate, and oxyCODONE-acetaminophen  History Review  Allergy: James [name] has No Known Allergies. Drug: James [name]  reports no history of drug use. Alcohol:  reports current alcohol use of about 1.0 standard drink of alcohol per week. Tobacco:  reports that he has been smoking cigarettes. He has a 23.50 pack-year smoking history. He has never used smokeless tobacco. Social: James [name]  reports that he has been smoking cigarettes. He has a 23.50 pack-year smoking history. He has never used smokeless tobacco. He reports current alcohol use of about 1.0 standard drink of alcohol per week. He reports that he does not use drugs. Medical:  has a past medical history of CLL (chronic lymphocytic leukemia) ([place]), Constipation, Depression, Diabetes mellitus without complication ([place]), Hematuria, Hyperlipidemia, Hypertension, and Therapeutic opioid induced constipation. Surgical: James [name]  has a past surgical history that includes Cholecystectomy (1983); Esophagogastroduodenoscopy (egd) with propofol (N/A, 11/20/2016); Cataract extraction w/ intraocular lens implant (Bilateral); Lower Extremity Angiography (Right, 05/19/2017); Esophagogastroduodenoscopy (egd) with propofol (N/A, 10/27/2017); Colonoscopy with propofol (N/A, 10/27/2017); and Lower Extremity Angiography (Left, 08/10/2018). Family: family history includes Hypertension in his sister.  Laboratory Chemistry Profile   Renal Lab Results  Component Value Date   BUN 31 (H) 11/09/2019   CREATININE 1.14 11/09/2019   GFRAA >60 11/09/2019   GFRNONAA >60 11/09/2019     Hepatic Lab Results  Component Value Date   AST 9 (L) 11/09/2019   ALT 12 11/09/2019   ALBUMIN 4.3 11/09/2019   ALKPHOS 55 11/09/2019   HCVAB NON REACTIVE 06/03/2019   AMYLASE 22 (L) 07/16/2019   LIPASE 23 09/12/2019  Electrolytes Lab Results  Component Value Date   NA 138 11/09/2019   K 4.5 11/09/2019   CL  103 11/09/2019   CALCIUM 9.2 11/09/2019   MG 2.4 06/03/2019   PHOS 4.4 06/03/2019     Bone Lab Results  Component Value Date   VD25OH 13.74 (L) 05/11/2019     Inflammation (CRP: Acute Phase) (ESR: Chronic Phase) Lab Results  Component Value Date   CRP 0.6 05/11/2019   ESRSEDRATE 39 (H) 05/11/2019   LATICACIDVEN 1.7 07/16/2019       Note: Above Lab results reviewed.  Recent Imaging Review  DG PAIN CLINIC C-ARM 1-60 MIN NO REPORT Fluoro was used, but no Radiologist interpretation will be provided.  Please refer to "NOTES" tab for provider progress note. Note: Reviewed        Physical Exam  General appearance: Well nourished, well developed, and well hydrated. In no apparent acute distress Mental status: Alert, oriented x 3 (person, place, & time)       Respiratory: No evidence of acute respiratory distress Eyes: PERLA Vitals: BP (!) 157/83   Pulse 75   Temp 98.2 F (36.8 C) (Temporal)   Resp 16   Ht 5' 8"  (1.727 m)   Wt 153 lb (69.4 kg)   SpO2 100%   BMI 23.26 kg/m  BMI: Estimated body mass index is 23.26 kg/m as calculated from the following:   Height as of this encounter: 5' 8"  (1.727 m).   Weight as of this encounter: 153 lb (69.4 kg). Ideal: Ideal body weight: 68.4 kg (150 lb 12.7 oz) Adjusted ideal body weight: 68.8 kg (151 lb 10.8 oz)  Assessment   Status Diagnosis  Controlled Controlled Controlled 1. Chronic pain syndrome   2. Chronic abdominal pain   3. Chronic epigastric pain (1ry area of Pain)   4. Cancer-related pain   5. CLL (chronic lymphocytic leukemia) ([place])   6. Malignant lymphoma, small lymphocytic ([facility])   7. Abdominal wall pain in epigastric region   8. Chronic anticoagulation (Plavix)      Updated Problems: No problems updated.  Plan of Care  Problem-specific:  No problem-specific Assessment & Plan notes found for this encounter.  James [name] has a current medication list which includes the following long-term  medication(s): atorvastatin, vitamin d3, iron polysaccharides, metformin, montelukast, and calcium plus d3 absorbable.  Pharmacotherapy (Medications Ordered): No orders of the defined types were placed in this encounter.  Orders:  Orders Placed This Encounter  Procedures  . PUMP TRIAL    Standing Status:   Future    Standing Expiration Date:   05/20/2020    Scheduling Instructions:     Same day, outpatient trial. Schedule the patient to be the first case of the morning. Estimated for patient to be here for 4 to 6 hours. Have nurse order necessary medications. Have a spinal tray available.    Order Specific Question:   Where will this procedure be performed?    Answer:   [facility]  . Blood Thinner Instructions to Nursing    If the patient requires a Lovenox-bridge therapy, make sure arrangements are made to institute it with the assistance of the PCP.    Standing Status:   Future    Standing Expiration Date:   01/19/2020    Scheduling Instructions:     Always stop the Plavix (Clopidogrel) x 7-10 days prior to procedure or surgery.   Follow-up plan:   Return for Procedure (no sedation): Intrathecal Pump  Trial, (Blood Thinner Protocol).      Interventional treatment options: Planned, scheduled, and/or pending:    NOTE: PLAVIX ANTICOAGULATION  (Stop: 7 days  Restart: 2 hrs)    Under consideration:   Diagnostic intrathecal injection of opioid analgesic (intrathecal pump trial).   Therapeutic/palliative (PRN):   Diagnostic bilateral celiac plexus block #3  (patient indicated no relief)    Recent Visits Date Type Provider Dept  11/02/19 Procedure visit Milinda Pointer, MD [facility]  10/19/19 Office Visit Milinda Pointer, MD [facility]  Showing recent visits within past 90 days and meeting all other requirements Today's Visits Date Type Provider Dept  11/18/19 Office Visit Milinda Pointer, MD [facility]  Showing today's visits  and meeting all other requirements Future Appointments No visits were found meeting these conditions. Showing future appointments within next 90 days and meeting all other requirements  I discussed the assessment and treatment plan with the patient. The patient was provided an opportunity to ask questions and all were answered. The patient agreed with the plan and demonstrated an understanding of the instructions.  Patient advised to call back or seek an in-person evaluation if the symptoms or condition worsens.  Duration of encounter: 35 minutes.  Note by: Gaspar Cola, MD Date: 11/18/2019; Time: 12:07 PM

## 2019-11-22 ENCOUNTER — Inpatient Hospital Stay (HOSPITAL_BASED_OUTPATIENT_CLINIC_OR_DEPARTMENT_OTHER): Payer: Medicare Other | Admitting: Hospice and Palliative Medicine

## 2019-11-22 DIAGNOSIS — C9110 Chronic lymphocytic leukemia of B-cell type not having achieved remission: Secondary | ICD-10-CM

## 2019-11-22 DIAGNOSIS — Z515 Encounter for palliative care: Secondary | ICD-10-CM | POA: Diagnosis not present

## 2019-11-22 MED ORDER — MORPHINE SULFATE ER 15 MG PO TBCR: 15.0000 mg | EXTENDED_RELEASE_TABLET | Freq: Two times a day (BID) | ORAL | 0 refills | Status: DC

## 2019-11-22 NOTE — Progress Notes (Signed)
Virtual Visit via Video Note  I connected with James [name] on 11/22/19 at 10:45 AM EDT by a video enabled telemedicine application and verified that I am speaking with the correct person using two identifiers.   I discussed the limitations of evaluation and management by telemedicine and the availability of in person appointments. The patient expressed understanding and agreed to proceed.  History of Present Illness: James [name] is a 69 y.o. male with multiple medical problems including stage IV CLL (initially diagnosed in 2006) most recently treated with ibrutinib but was discontinued due to poor tolerance. He is now on treatment with Gazyva. Patient has had severe and chronic abdominal pain of unclear etiology. He has had extensive work-up including referral to [facility]. He has been followed by interventional pain management and is status post celiac plexus blocks without significant relief in symptoms. He was referred to palliative care to help address goals and manage ongoing symptoms..    Observations/Objective: I called spoke with patient by phone as patient did not respond to request for MyChart visit.  He reports he is doing about the same.  He continues to endorse chronic abdominal pain.  Patient requests refill of the MS Contin today.  Patient is being followed by Dr. Dossie Arbour and patient says that intrathecal pain pump is being considered given poor response to celiac plexus block.  Overall, patient denies any significant changes or concerns today.  Assessment and Plan: Stage IV CLL -currently on Gazyva and followed by Dr. Rogue Bussing  Chronic pain -on MS Contin 15 mg every 12 hours (#60 tablets).  Continue Percocet 5 325 mg every 6 hours as needed for breakthrough pain.  Follow Up Instructions: Follow-up MyChart visit about a month   I discussed the assessment and treatment plan with the patient. The patient was provided an opportunity to ask questions and all were  answered. The patient agreed with the plan and demonstrated an understanding of the instructions.   The patient was advised to call back or seek an in-person evaluation if the symptoms worsen or if the condition fails to improve as anticipated.  I provided 7  minutes of non-face-to-face time during this encounter.   Irean Hong, NP

## 2019-11-24 ENCOUNTER — Telehealth: Payer: Self-pay

## 2019-11-24 NOTE — Telephone Encounter (Signed)
Attempted to call patient regarding his questions about decreased urinary flow with Intrathecal pump.  Left message to call office.

## 2019-11-24 NOTE — Telephone Encounter (Signed)
He said he was reading over the info re pump trial. He said one of the possible side affects was  slowing his pee flow down. He wanted to let you know he already has that problem and wants to know if getting the pump will make it worse.

## 2019-11-30 ENCOUNTER — Ambulatory Visit: Payer: Medicare Other | Admitting: Pain Medicine

## 2019-11-30 NOTE — Progress Notes (Deleted)
No show

## 2019-12-07 ENCOUNTER — Inpatient Hospital Stay (HOSPITAL_BASED_OUTPATIENT_CLINIC_OR_DEPARTMENT_OTHER): Payer: Medicare Other | Admitting: Internal Medicine

## 2019-12-07 ENCOUNTER — Other Ambulatory Visit: Payer: Self-pay

## 2019-12-07 ENCOUNTER — Inpatient Hospital Stay: Payer: Medicare Other | Attending: Internal Medicine

## 2019-12-07 ENCOUNTER — Encounter: Payer: Self-pay | Admitting: Internal Medicine

## 2019-12-07 ENCOUNTER — Inpatient Hospital Stay: Payer: Medicare Other

## 2019-12-07 VITALS — BP 138/66 | HR 58 | Resp 16

## 2019-12-07 DIAGNOSIS — Z79899 Other long term (current) drug therapy: Secondary | ICD-10-CM | POA: Diagnosis not present

## 2019-12-07 DIAGNOSIS — N183 Chronic kidney disease, stage 3 unspecified: Secondary | ICD-10-CM | POA: Diagnosis not present

## 2019-12-07 DIAGNOSIS — R300 Dysuria: Secondary | ICD-10-CM

## 2019-12-07 DIAGNOSIS — C9110 Chronic lymphocytic leukemia of B-cell type not having achieved remission: Secondary | ICD-10-CM | POA: Diagnosis not present

## 2019-12-07 DIAGNOSIS — D631 Anemia in chronic kidney disease: Secondary | ICD-10-CM | POA: Diagnosis not present

## 2019-12-07 DIAGNOSIS — E538 Deficiency of other specified B group vitamins: Secondary | ICD-10-CM

## 2019-12-07 DIAGNOSIS — I70219 Atherosclerosis of native arteries of extremities with intermittent claudication, unspecified extremity: Secondary | ICD-10-CM | POA: Diagnosis not present

## 2019-12-07 DIAGNOSIS — C8300 Small cell B-cell lymphoma, unspecified site: Secondary | ICD-10-CM

## 2019-12-07 DIAGNOSIS — C9112 Chronic lymphocytic leukemia of B-cell type in relapse: Secondary | ICD-10-CM

## 2019-12-07 DIAGNOSIS — Z5112 Encounter for antineoplastic immunotherapy: Secondary | ICD-10-CM | POA: Insufficient documentation

## 2019-12-07 LAB — IRON AND TIBC
Iron: 95 ug/dL (ref 45–182)
Saturation Ratios: 26 % (ref 17.9–39.5)
TIBC: 363 ug/dL (ref 250–450)
UIBC: 268 ug/dL

## 2019-12-07 LAB — COMPREHENSIVE METABOLIC PANEL
ALT: 11 U/L (ref 0–44)
AST: 12 U/L — ABNORMAL LOW (ref 15–41)
Albumin: 4.3 g/dL (ref 3.5–5.0)
Alkaline Phosphatase: 63 U/L (ref 38–126)
Anion gap: 11 (ref 5–15)
BUN: 27 mg/dL — ABNORMAL HIGH (ref 8–23)
CO2: 25 mmol/L (ref 22–32)
Calcium: 9.3 mg/dL (ref 8.9–10.3)
Chloride: 103 mmol/L (ref 98–111)
Creatinine, Ser: 1.12 mg/dL (ref 0.61–1.24)
GFR calc Af Amer: >60 mL/min (ref >60)
GFR calc non Af Amer: >60 mL/min (ref >60)
Glucose, Bld: 166 mg/dL — ABNORMAL HIGH (ref 70–99)
Potassium: 5.2 mmol/L — ABNORMAL HIGH (ref 3.5–5.1)
Sodium: 139 mmol/L (ref 135–145)
Total Bilirubin: 0.4 mg/dL (ref 0.3–1.2)
Total Protein: 7 g/dL (ref 6.5–8.1)

## 2019-12-07 LAB — CBC WITH DIFFERENTIAL/PLATELET
Abs Immature Granulocytes: 0.04 10*3/uL (ref 0.00–0.07)
Basophils Absolute: 0 10*3/uL (ref 0.0–0.1)
Basophils Relative: 0 %
Eosinophils Absolute: 0 10*3/uL (ref 0.0–0.5)
Eosinophils Relative: 0 %
HCT: 30.1 % — ABNORMAL LOW (ref 39.0–52.0)
Hemoglobin: 9.6 g/dL — ABNORMAL LOW (ref 13.0–17.0)
Immature Granulocytes: 1 %
Lymphocytes Relative: 25 %
Lymphs Abs: 1.8 10*3/uL (ref 0.7–4.0)
MCH: 33.3 pg (ref 26.0–34.0)
MCHC: 31.9 g/dL (ref 30.0–36.0)
MCV: 104.5 fL — ABNORMAL HIGH (ref 80.0–100.0)
Monocytes Absolute: 0.4 10*3/uL (ref 0.1–1.0)
Monocytes Relative: 6 %
Neutro Abs: 4.9 10*3/uL (ref 1.7–7.7)
Neutrophils Relative %: 68 %
Platelets: 210 10*3/uL (ref 150–400)
RBC: 2.88 MIL/uL — ABNORMAL LOW (ref 4.22–5.81)
RDW: 15.5 % (ref 11.5–15.5)
WBC: 7.3 10*3/uL (ref 4.0–10.5)
nRBC: 0 % (ref 0.0–0.2)

## 2019-12-07 LAB — URINALYSIS, COMPLETE (UACMP) WITH MICROSCOPIC
Bacteria, UA: NONE SEEN
Bilirubin Urine: NEGATIVE
Glucose, UA: NEGATIVE mg/dL
Hgb urine dipstick: NEGATIVE
Ketones, ur: NEGATIVE mg/dL
Leukocytes,Ua: NEGATIVE
Nitrite: NEGATIVE
Protein, ur: NEGATIVE mg/dL
Specific Gravity, Urine: 1.02 (ref 1.005–1.030)
pH: 5 (ref 5.0–8.0)

## 2019-12-07 LAB — LACTATE DEHYDROGENASE: LDH: 109 U/L (ref 98–192)

## 2019-12-07 LAB — FERRITIN: Ferritin: 81 ng/mL (ref 24–336)

## 2019-12-07 MED ORDER — MONTELUKAST SODIUM 10 MG PO TABS
10.0000 mg | ORAL_TABLET | Freq: Once | ORAL | Status: AC
  Administered 2019-12-07: 10 mg via ORAL
  Filled 2019-12-07: qty 1

## 2019-12-07 MED ORDER — DARBEPOETIN ALFA 300 MCG/0.6ML IJ SOSY
300.0000 ug | PREFILLED_SYRINGE | Freq: Once | INTRAMUSCULAR | Status: AC
  Administered 2019-12-07: 300 ug via SUBCUTANEOUS
  Filled 2019-12-07: qty 0.6

## 2019-12-07 MED ORDER — SODIUM CHLORIDE 0.9 % IV SOLN
1000.0000 mg | Freq: Once | INTRAVENOUS | Status: AC
  Administered 2019-12-07: 1000 mg via INTRAVENOUS
  Filled 2019-12-07: qty 40

## 2019-12-07 MED ORDER — DIPHENHYDRAMINE HCL 50 MG/ML IJ SOLN
50.0000 mg | Freq: Once | INTRAMUSCULAR | Status: AC
  Administered 2019-12-07: 50 mg via INTRAVENOUS
  Filled 2019-12-07: qty 1

## 2019-12-07 MED ORDER — SODIUM CHLORIDE 0.9 % IV SOLN
Freq: Once | INTRAVENOUS | Status: AC
  Filled 2019-12-07: qty 250

## 2019-12-07 MED ORDER — ACETAMINOPHEN 325 MG PO TABS
650.0000 mg | ORAL_TABLET | Freq: Once | ORAL | Status: AC
  Administered 2019-12-07: 650 mg via ORAL
  Filled 2019-12-07: qty 2

## 2019-12-07 MED ORDER — SODIUM CHLORIDE 0.9 % IV SOLN
20.0000 mg | Freq: Once | INTRAVENOUS | Status: AC
  Administered 2019-12-07: 20 mg via INTRAVENOUS
  Filled 2019-12-07: qty 20

## 2019-12-07 NOTE — Assessment & Plan Note (Addendum)
#   CLL/SLL- relapsed most recently on ibrutinib; FEB 2021- CT Ab/Pelvis-CT scan chest and pelvis shows significant progression of axillary/mediastinal/abdominal/pelvic-however the largest lesion approximately inch in size. MARCH 2021- ~ 2.5 cm RP LN; multiple smaller LN.  Currently on Gazyva; STABLE.   #Proceed with [name] #6 today. Labs today reviewed;  acceptable for treatment today; discussed that this would be his last round of chemotherapy.  We will plan imaging in the next 1 to 2 month/order next visit.  #Chronic abdominal pain-unclear etiology-STABLE [ urine porphyrins were slightly abnormal ; plasma fractionated porphyrin- Normal]- awaiting pain clinic; . continue narcotics/scripts refilled.    #Hyperkalemia-5.3/CKD-III-secondary renal insufficiency/creatinine 1.3- [baseline 1.4; Dr.Lateef].STABLE   # Anemia-hemoglobin 9.6 ;Secondary to CKD versus progressive leukemia -STABLE;. Aranesp today.   # Urinary pressure- prostate enlargement on CT scan- check UA & culture today; check PSA at next visit; PSA 2019-Normal.   # DISPOSITION:  # UA and culture today # Aranesp; [name]; today # 4 week- MD; labs- cbc/cmpLDH; PSA;HOLD tube;possible aranesp; Dr.B

## 2019-12-07 NOTE — Progress Notes (Signed)
[facility] [name] OFFICE PROGRESS NOTE  Patient Care Team: [name], MD as PCP - General (Internal Medicine) Cammie Sickle, MD as Consulting Physician (Internal Medicine)  Cancer Staging No matching staging information was found for the patient.   Oncology History Overview Note  # 2006- CLL STAGE IV; MAY 2011- WBC- 57K;Platelets-99;Hb-12/CT Bulky LN; BMBx- 80% Invol; del 11; START Benda-Ritux x4 [finished Sep 2011];   # July 2015-Progression; Sep 2015-START ibrutinib; CT scan DEC 2015- Improvement LN; Cont Ibrutinib '140mg'$ /d; NOV 2016 CT- 1-2CM LN [mild progression compared to Dec 2015];NOV 2016- FISH peripheral blood- NO MUTATIONS/CD-38 Positive; NOV 7th- CONT IBRUTINIB 2 pills/day; CT AUG 2017- STABLE;  DEC 6th PET- Mild RP LN/ Retrocrural LN  # OFF ibrutinib [? intol]- sep 2019- Jan 16th 2020; Re-start Ibrutinib; September 2020-stop ibrutinib [poor tolerance/worsening anemia]  # Jan 16th 2020- start aranesp; HOLD while on [place].   # MARCH 11th 2021[name]   # DEC 2019- PAIN CONTRACT  # PALLIATIVE CARE- 06/21/2019-  # October 2019-bone marrow biopsy [worsening anemia]-question dyserythropoietic changes/small clone of CLL;  # [facility] HEM- NEG.  SA skin infection [Oct 2016] s/p clinda -------------------------------------------------------    DIAGNOSIS: CLL  STAGE:   IV      ;GOALS: palliative  CURRENT/MOST RECENT THERAPY : Gazyva [C]   CLL (chronic lymphocytic leukemia) ([place])  07/08/2019 -  Chemotherapy   The patient had obinutuzumab (GAZYVA) 100 mg in sodium chloride 0.9 % 100 mL (0.9615 mg/mL) chemo infusion, 100 mg, Intravenous, Once, 6 of 6 cycles Administration: 100 mg (07/08/2019), 900 mg (07/09/2019), 1,000 mg (07/26/2019), 1,000 mg (08/16/2019), 1,000 mg (08/02/2019), 1,000 mg (09/13/2019), 1,000 mg (10/11/2019), 1,000 mg (11/09/2019)  for chemotherapy treatment.      INTERVAL HISTORY:  James [name] 69 y.o.  male pleasant  patient above progressive CLL and chronic abdominal pain of unclear etiology; anemia- ckd/hyperkalemia currently James [name] is here for follow-up.  Notes to to be exerting more abdominal pressure with urination; no urgency; no frequency or dysuria.  Denies any blood in urine.  Otherwise appetite is fair.  He continues to have intermittent chronic abdominal pain not any worse.  Continues to be a narcotic pain medication.  No weight loss in fact weight gain.    Review of Systems  Constitutional: Positive for malaise/fatigue. Negative for chills, diaphoresis and fever.  HENT: Negative for nosebleeds and sore throat.   Eyes: Negative for double vision.  Respiratory: Positive for shortness of breath. Negative for cough, hemoptysis, sputum production and wheezing.   Cardiovascular: Negative for chest pain, palpitations, orthopnea and leg swelling.  Gastrointestinal: Positive for abdominal pain, constipation and nausea. Negative for blood in stool, diarrhea, heartburn, melena and vomiting.  Genitourinary: Negative for dysuria, frequency and urgency.  Skin: Negative.  Negative for itching and rash.  Neurological: Negative for dizziness, tingling, focal weakness, weakness and headaches.  Endo/Heme/Allergies: Does not bruise/bleed easily.  Psychiatric/Behavioral: Negative for depression. The patient is not nervous/anxious and does not have insomnia.       PAST MEDICAL HISTORY :  Past Medical History:  Diagnosis Date  . CLL (chronic lymphocytic leukemia) ([place])   . Constipation   . Depression   . Diabetes mellitus without complication ([place])   . Hematuria   . Hyperlipidemia   . Hypertension   . Therapeutic opioid induced constipation     PAST SURGICAL HISTORY :   Past Surgical History:  Procedure Laterality Date  . CATARACT EXTRACTION W/ INTRAOCULAR LENS IMPLANT Bilateral   .  CHOLECYSTECTOMY  1983  . COLONOSCOPY WITH PROPOFOL N/A 10/27/2017   Procedure: COLONOSCOPY WITH PROPOFOL;  Surgeon:  Manya Silvas, MD;  Location: [facility] ENDOSCOPY;  Service: Endoscopy;  Laterality: N/A;  . ESOPHAGOGASTRODUODENOSCOPY (EGD) WITH PROPOFOL N/A 11/20/2016   Procedure: ESOPHAGOGASTRODUODENOSCOPY (EGD) WITH PROPOFOL;  Surgeon: Manya Silvas, MD;  Location: [facility] ENDOSCOPY;  Service: Endoscopy;  Laterality: N/A;  . ESOPHAGOGASTRODUODENOSCOPY (EGD) WITH PROPOFOL N/A 10/27/2017   Procedure: ESOPHAGOGASTRODUODENOSCOPY (EGD) WITH PROPOFOL;  Surgeon: Manya Silvas, MD;  Location[facility] ENDOSCOPY;  Service: Endoscopy;  Laterality: N/A;  . LOWER EXTREMITY ANGIOGRAPHY Right 05/19/2017   Procedure: LOWER EXTREMITY ANGIOGRAPHY;  Surgeon: Algernon Huxley, MD;  Location: [place] CV LAB;  Service: Cardiovascular;  Laterality: Right;  . LOWER EXTREMITY ANGIOGRAPHY Left 08/10/2018   Procedure: LOWER EXTREMITY ANGIOGRAPHY;  Surgeon: Algernon Huxley, MD;  Location: [place] CV LAB;  Service: Cardiovascular;  Laterality: Left;    FAMILY HISTORY :   Family History  Problem Relation Age of Onset  . Hypertension Sister     SOCIAL HISTORY:   Social History   Tobacco Use  . Smoking status: Current Every Day Smoker    Packs/day: 0.50    Years: 47.00    Pack years: 23.50    Types: Cigarettes  . Smokeless tobacco: Never Used  Vaping Use  . Vaping Use: Never used  Substance Use Topics  . Alcohol use: Yes    Alcohol/week: 1.0 standard drink    Types: 1 Glasses of wine per week    Comment:  almost none in last 6 months  . Drug use: No    ALLERGIES:  has No Known Allergies.  MEDICATIONS:  Current Outpatient Medications  Medication Sig Dispense Refill  . acyclovir (ZOVIRAX) 400 MG tablet One pill a day [to prevent shingles] 30 tablet 3  . aspirin EC 81 MG tablet Take 81 mg by mouth daily.    Marland Kitchen atorvastatin (LIPITOR) 10 MG tablet Take 1 tablet (10 mg total) by mouth daily. 30 tablet 11  . Cholecalciferol (VITAMIN D3) 125 MCG (5000 UT) CAPS Take 1 capsule (5,000 Units total) by mouth daily with  breakfast. Take along with calcium and magnesium. 90 capsule 1  . clopidogrel (PLAVIX) 75 MG tablet Take 1 tablet by mouth once daily 90 tablet 0  . dexamethasone (DECADRON) 4 MG tablet Start 2 days prior to infusion; Take for 2 days. Do not take on the day of infusion. 60 tablet 3  . Ensure (ENSURE) Take 237 mLs by mouth.    . folic acid (FOLVITE) 1 MG tablet Take 1 tablet (1 mg total) by mouth daily. 90 tablet 1  . iron polysaccharides (NU-IRON) 150 MG capsule Take 1 capsule (150 mg total) by mouth daily. (Patient taking differently: Take 150 mg by mouth every other day. ) 30 capsule 1  . lactulose (CHRONULAC) 10 GM/15ML solution Take by mouth.    . metFORMIN (GLUCOPHAGE) 1000 MG tablet Take 1,000 mg by mouth daily with breakfast.     . montelukast (SINGULAIR) 10 MG tablet Take 1 tablet (10 mg total) by mouth at bedtime. Start 2 days prior to infusion. Take it for 4 days. 60 tablet 0  . morphine (MS CONTIN) 15 MG 12 hr tablet Take 1 tablet (15 mg total) by mouth every 12 (twelve) hours. 60 tablet 0  . naloxegol oxalate (MOVANTIK) 25 MG TABS tablet Take 1 tablet (25 mg total) by mouth daily. 30 tablet 0  . oxyCODONE-acetaminophen (PERCOCET/ROXICET) 5-325 MG  tablet Take 1 tablet by mouth every 6 (six) hours as needed for severe pain. 90 tablet 0  . Calcium Carb-Cholecalciferol (CALCIUM PLUS D3 ABSORBABLE) [phone] MG-UNIT CAPS Take 1 capsule by mouth 2 (two) times daily with a meal. 60 capsule 5   No current facility-administered medications for this visit.    PHYSICAL EXAMINATION: ECOG PERFORMANCE STATUS: 1 - Symptomatic but completely ambulatory  BP (!) 157/79 (BP Location: Right Arm, Patient Position: Sitting, Cuff Size: Normal)   Pulse 66   Temp (!) 97.2 F (36.2 C) (Tympanic)   Resp 16   Ht 5' 8"  (1.727 m)   Wt 160 lb 6.4 oz (72.8 kg)   SpO2 100%   BMI 24.39 kg/m   Filed Weights   12/07/19 0832  Weight: 160 lb 6.4 oz (72.8 kg)    Physical Exam Constitutional:      Comments:  He is alone.  Appears pale.  HENT:     Head: Normocephalic and atraumatic.     Mouth/Throat:     Pharynx: No oropharyngeal exudate.  Eyes:     Pupils: Pupils are equal, round, and reactive to light.  Cardiovascular:     Rate and Rhythm: Normal rate and regular rhythm.  Pulmonary:     Effort: No respiratory distress.     Breath sounds: No wheezing.  Abdominal:     General: Bowel sounds are normal. There is no distension.     Palpations: Abdomen is soft. There is no mass.     Tenderness: There is no abdominal tenderness. There is no guarding or rebound.  Musculoskeletal:        General: No tenderness. Normal range of motion.     Cervical back: Normal range of motion and neck supple.  Skin:    General: Skin is warm.     Coloration: Skin is pale.     Comments: Multiple bruises noted in bilateral upper extremity.  Neurological:     Mental Status: He is alert and oriented to person, place, and time.  Psychiatric:        Mood and Affect: Affect normal.       LABORATORY DATA:  I have reviewed the data as listed    Component Value Date/Time   NA 139 12/07/2019 0812   NA 142 05/03/2014 1139   K 5.2 (H) 12/07/2019 0812   K 4.7 05/03/2014 1139   CL 103 12/07/2019 0812   CL 110 (H) 05/03/2014 1139   CO2 25 12/07/2019 0812   CO2 24 05/03/2014 1139   GLUCOSE 166 (H) 12/07/2019 0812   GLUCOSE 98 05/03/2014 1139   BUN 27 (H) 12/07/2019 0812   BUN 20 (H) 05/03/2014 1139   CREATININE 1.12 12/07/2019 0812   CREATININE 1.22 08/09/2014 1122   CALCIUM 9.3 12/07/2019 0812   CALCIUM 8.6 05/03/2014 1139   PROT 7.0 12/07/2019 0812   PROT 7.5 05/03/2014 1139   ALBUMIN 4.3 12/07/2019 0812   ALBUMIN 4.1 05/03/2014 1139   AST 12 (L) 12/07/2019 0812   AST 15 05/03/2014 1139   ALT 11 12/07/2019 0812   ALT 26 05/03/2014 1139   ALKPHOS 63 12/07/2019 0812   ALKPHOS 94 05/03/2014 1139   BILITOT 0.4 12/07/2019 0812   BILITOT 0.5 05/03/2014 1139   GFRNONAA >60 12/07/2019 0812   GFRNONAA >60  08/09/2014 1122   GFRAA >60 12/07/2019 0812   GFRAA >60 08/09/2014 1122    No results found for: SPEP, UPEP  Lab Results  Component Value Date  WBC 7.3 12/07/2019   NEUTROABS 4.9 12/07/2019   HGB 9.6 (L) 12/07/2019   HCT 30.1 (L) 12/07/2019   MCV 104.5 (H) 12/07/2019   PLT 210 12/07/2019      Chemistry      Component Value Date/Time   NA 139 12/07/2019 0812   NA 142 05/03/2014 1139   K 5.2 (H) 12/07/2019 0812   K 4.7 05/03/2014 1139   CL 103 12/07/2019 0812   CL 110 (H) 05/03/2014 1139   CO2 25 12/07/2019 0812   CO2 24 05/03/2014 1139   BUN 27 (H) 12/07/2019 0812   BUN 20 (H) 05/03/2014 1139   CREATININE 1.12 12/07/2019 0812   CREATININE 1.22 08/09/2014 1122      Component Value Date/Time   CALCIUM 9.3 12/07/2019 0812   CALCIUM 8.6 05/03/2014 1139   ALKPHOS 63 12/07/2019 0812   ALKPHOS 94 05/03/2014 1139   AST 12 (L) 12/07/2019 0812   AST 15 05/03/2014 1139   ALT 11 12/07/2019 0812   ALT 26 05/03/2014 1139   BILITOT 0.4 12/07/2019 0812   BILITOT 0.5 05/03/2014 1139       RADIOGRAPHIC STUDIES: I have personally reviewed the radiological images as listed and agreed with the findings in the report. No results found.   ASSESSMENT & PLAN:  CLL (chronic lymphocytic leukemia) ([place]) # CLL/SLL- relapsed most recently on ibrutinib; FEB 2021- CT Ab/Pelvis-CT scan chest and pelvis shows significant progression of axillary/mediastinal/abdominal/pelvic-however the largest lesion approximately inch in size. MARCH 2021- ~ 2.5 cm RP LN; multiple smaller LN.  Currently on Gazyva; STABLE.   #Proceed with James [name] #6 today. Labs today reviewed;  acceptable for treatment today; discussed that this would be his last round of chemotherapy.  We will plan imaging in the next 1 to 2 month/order next visit.  #Chronic abdominal pain-unclear etiology-STABLE [ urine porphyrins were slightly abnormal ; plasma fractionated porphyrin- Normal]- awaiting pain clinic; . continue  narcotics/scripts refilled.    #Hyperkalemia-5.3/CKD-III-secondary renal insufficiency/creatinine 1.3- [baseline 1.4; Dr.Lateef].STABLE   # Anemia-hemoglobin 9.6 ;Secondary to CKD versus progressive leukemia -STABLE;. Aranesp today.   # Urinary pressure- prostate enlargement on CT scan- check UA & culture today; check PSA at next visit; PSA 2019-Normal.   # DISPOSITION:  # UA and culture today # Aranesp; Gazyva; today # 4 week- MD; labs- cbc/cmpLDH; PSA;HOLD tube;possible aranesp; Dr.B   No orders of the defined types were placed in this encounter.  All questions were answered. The patient knows to call the clinic with any problems, questions or concerns.      Cammie Sickle, MD 12/07/2019 11:25 AM

## 2019-12-08 ENCOUNTER — Other Ambulatory Visit: Payer: Self-pay | Admitting: Internal Medicine

## 2019-12-08 DIAGNOSIS — C9112 Chronic lymphocytic leukemia of B-cell type in relapse: Secondary | ICD-10-CM

## 2019-12-08 DIAGNOSIS — C8300 Small cell B-cell lymphoma, unspecified site: Secondary | ICD-10-CM

## 2019-12-08 DIAGNOSIS — C9192 Lymphoid leukemia, unspecified, in relapse: Secondary | ICD-10-CM

## 2019-12-08 LAB — URINE CULTURE: Culture: <10000 — AB

## 2019-12-08 NOTE — Telephone Encounter (Signed)
Please inform patient that urine test is negative for infection.  Please check with patient if he continues to have urinary problems-I would recommend referral to [facility] urology.   Thanks, GB

## 2019-12-09 LAB — SAMPLE TO BLOOD BANK

## 2019-12-09 NOTE — Addendum Note (Signed)
Addended by: Delice Bison E on: 12/09/2019 11:23 AM   Modules accepted: Orders

## 2019-12-09 NOTE — Telephone Encounter (Signed)
Spoke with pt in regards to message below. Pt declines referral to urology at this time. Would like to see how he does over time. Pt also states he needs a refill on his oxycodone. Please advise.

## 2019-12-09 NOTE — Telephone Encounter (Signed)
Dr. B please review prescription for narcotic

## 2019-12-10 MED ORDER — OXYCODONE-ACETAMINOPHEN 5-325 MG PO TABS: 1.0000 | ORAL_TABLET | Freq: Four times a day (QID) | ORAL | 0 refills | Status: DC | PRN

## 2019-12-13 ENCOUNTER — Inpatient Hospital Stay: Payer: Medicare Other

## 2019-12-13 NOTE — Progress Notes (Signed)
Nutrition Follow-up:  Patient with stage IV CLL.  Patient receiving gazyva.  Noted met with pain management.    Spoke with patient via phone for nutrition follow-up.  Patient reports that appetite is a little bit better.  Abdominal pain about the same, maybe slight improvement.  Reports that he is trying to drink ensure and eat to get weight back. Reports that sweet foods are tasting good to him.      Medications: reviewed  Labs: reviewed  Anthropometrics:   Weight 160 lb on 8/10 increased from 140 lb on 7/6  Patient denies fluid weight gain.    NUTRITION DIAGNOSIS: Inadequate oral intake improving with weight gain    INTERVENTION:  Will provide additional case of ensure enlive for patient to pick up at registration desk.   Patient to continue eating high calorie, high protein foods for weight gain. Patient has contact information    MONITORING, EVALUATION, GOAL: weight trends, intake   NEXT VISIT: Sept 27 phone f/u  James [name], [place], [place] Registered Dietitian [phone] (mobile)

## 2019-12-14 ENCOUNTER — Ambulatory Visit (HOSPITAL_BASED_OUTPATIENT_CLINIC_OR_DEPARTMENT_OTHER): Payer: Medicare Other | Admitting: Pain Medicine

## 2019-12-14 ENCOUNTER — Ambulatory Visit
Admission: RE | Admit: 2019-12-14 | Discharge: 2019-12-14 | Disposition: A | Discharge status: Home / Self Care | Payer: Medicare Other | Source: Ambulatory Visit | Attending: Pain Medicine | Admitting: Pain Medicine

## 2019-12-14 ENCOUNTER — Encounter: Payer: Self-pay | Admitting: Pain Medicine

## 2019-12-14 ENCOUNTER — Other Ambulatory Visit: Payer: Self-pay

## 2019-12-14 VITALS — BP 132/60 | HR 73 | Temp 97.6°F | Resp 17 | Ht 68.0 in | Wt 160.0 lb

## 2019-12-14 DIAGNOSIS — C8300 Small cell B-cell lymphoma, unspecified site: Secondary | ICD-10-CM | POA: Insufficient documentation

## 2019-12-14 DIAGNOSIS — R1013 Epigastric pain: Secondary | ICD-10-CM | POA: Diagnosis present

## 2019-12-14 DIAGNOSIS — M5137 Other intervertebral disc degeneration, lumbosacral region: Secondary | ICD-10-CM | POA: Insufficient documentation

## 2019-12-14 DIAGNOSIS — C9110 Chronic lymphocytic leukemia of B-cell type not having achieved remission: Secondary | ICD-10-CM

## 2019-12-14 DIAGNOSIS — G893 Neoplasm related pain (acute) (chronic): Secondary | ICD-10-CM | POA: Diagnosis present

## 2019-12-14 DIAGNOSIS — G8929 Other chronic pain: Secondary | ICD-10-CM | POA: Insufficient documentation

## 2019-12-14 DIAGNOSIS — G894 Chronic pain syndrome: Secondary | ICD-10-CM | POA: Diagnosis present

## 2019-12-14 DIAGNOSIS — R109 Unspecified abdominal pain: Secondary | ICD-10-CM | POA: Insufficient documentation

## 2019-12-14 DIAGNOSIS — Z7901 Long term (current) use of anticoagulants: Secondary | ICD-10-CM | POA: Diagnosis present

## 2019-12-14 MED ORDER — LIDOCAINE HCL 2 % IJ SOLN
INTRAMUSCULAR | Status: AC
  Filled 2019-12-14: qty 10

## 2019-12-14 MED ORDER — FENTANYL CITRATE (PF) 100 MCG/2ML IJ SOLN
25.0000 ug | INTRAMUSCULAR | Status: DC | PRN
  Administered 2019-12-14: 25 ug via INTRAVENOUS

## 2019-12-14 MED ORDER — LIDOCAINE HCL 2 % IJ SOLN
20.0000 mL | Freq: Once | INTRAMUSCULAR | Status: AC
  Administered 2019-12-14: 400 mg

## 2019-12-14 MED ORDER — FENTANYL CITRATE (PF) 100 MCG/2ML IJ SOLN
INTRAMUSCULAR | Status: AC
  Filled 2019-12-14: qty 2

## 2019-12-14 NOTE — Patient Instructions (Signed)

## 2019-12-14 NOTE — Progress Notes (Signed)
PROVIDER NOTE: Information contained herein reflects review and annotations entered in association with encounter. Interpretation of such information and data should be left to medically-trained personnel. Information provided to patient can be located elsewhere in the medical record under "Patient Instructions". Document created using STT-dictation technology, any transcriptional errors that may result from process are unintentional.    Patient: James [name]  Service Category: Procedure  Provider: Gaspar Cola, MD  DOB: 14-Dec-1950  DOS: 12/14/2019  Location: [facility]  MRN: [id number]  Setting: Ambulatory - outpatient  Referring Provider: Milinda Pointer, MD  Type: Established Patient  Specialty: Interventional Pain Management  PCP: [name], MD   Primary Reason for Visit: Interventional Pain Management Treatment. CC: Abdominal Pain  Procedure:          Anesthesia, Analgesia, Anxiolysis:  Type: Diagnostic Intrathecal Injection of diagnostic substance Purpose: Intrathecal Pump Trial  Region: Lumbar Level: T12-L1 Level. Laterality: Midline          NOTE: 25 mcg of PF-Fentanyl injected.  Type: Local Anesthesia Indication(s): Analgesia         Route: Infiltration ([place]/IM) IV Access: Declined Sedation: Declined  Local Anesthetic: Lidocaine 1-2%  Position: Prone with head of the table was raised to facilitate breathing.   Indications: 1. Chronic epigastric pain (1ry area of Pain)   2. CLL (chronic lymphocytic leukemia) ([place])   3. DDD (degenerative disc disease), lumbosacral   4. Abdominal wall pain in epigastric region   5. Cancer-related pain   6. Malignant lymphoma, small lymphocytic ([place])   7. Chronic anticoagulation (Plavix)    Pain Score: Pre-procedure: 4 /10 Post-procedure: 1 /10   Pre-op Assessment:  Mr. Ginger is a 69 y.o. (year old), male patient, seen today for interventional treatment. He  has a past surgical history that includes  Cholecystectomy (1983); Esophagogastroduodenoscopy (egd) with propofol (N/A, 11/20/2016); Cataract extraction w/ intraocular lens implant (Bilateral); Lower Extremity Angiography (Right, 05/19/2017); Esophagogastroduodenoscopy (egd) with propofol (N/A, 10/27/2017); Colonoscopy with propofol (N/A, 10/27/2017); and Lower Extremity Angiography (Left, 08/10/2018). Mr. Chrisley has a current medication list which includes the following prescription(s): acyclovir, aspirin ec, atorvastatin, vitamin d3, clopidogrel, dexamethasone, ensure, folic acid, iron polysaccharides, metformin, montelukast, morphine, naloxegol oxalate, oxycodone-acetaminophen, and calcium plus d3 absorbable, and the following Facility-Administered Medications: fentanyl. His primarily concern today is the Abdominal Pain  Initial Vital Signs:  Pulse/HCG Rate: 73ECG Heart Rate: 76 Temp: 97.6 F (36.4 C) Resp: 14 BP: (!) 142/76 SpO2: 100 %  BMI: Estimated body mass index is 24.33 kg/m as calculated from the following:   Height as of this encounter: 5\' 8"  (1.727 m).   Weight as of this encounter: 160 lb (72.6 kg).  Risk Assessment: Allergies: Reviewed. He has No Known Allergies.  Allergy Precautions: None required Coagulopathies: Reviewed. None identified.  Blood-thinner therapy: None at this time Active Infection(s): Reviewed. None identified. Mr. Cromartie is afebrile  Site Confirmation: Mr. Mamaril was asked to confirm the procedure and laterality before marking the site Procedure checklist: Completed Consent: Before the procedure and under the influence of no sedative(s), amnesic(s), or anxiolytics, the patient was informed of the treatment options, risks and possible complications. To fulfill our ethical and legal obligations, as recommended by the American Medical Association's Code of Ethics, I have informed the patient of my clinical impression; the nature and purpose of the treatment or procedure; the risks, benefits, and possible  complications of the intervention; the alternatives, including doing nothing; the risk(s) and benefit(s) of the alternative treatment(s) or procedure(s); and  the risk(s) and benefit(s) of doing nothing. The patient was provided information about the general risks and possible complications associated with the procedure. These may include, but are not limited to: failure to achieve desired goals, infection, bleeding, organ or nerve damage, allergic reactions, paralysis, and death. In addition, the patient was informed of those risks and complications associated to Spine-related procedures, such as failure to decrease pain; infection (i.e.: Meningitis, epidural or intraspinal abscess); bleeding (i.e.: epidural hematoma, subarachnoid hemorrhage, or any other type of intraspinal or peri-dural bleeding); organ or nerve damage (i.e.: Any type of peripheral nerve, nerve root, or spinal cord injury) with subsequent damage to sensory, motor, and/or autonomic systems, resulting in permanent pain, numbness, and/or weakness of one or several areas of the body; allergic reactions; (i.e.: anaphylactic reaction); and/or death. Furthermore, the patient was informed of those risks and complications associated with the medications. These include, but are not limited to: allergic reactions (i.e.: anaphylactic or anaphylactoid reaction(s)); adrenal axis suppression; blood sugar elevation that in diabetics may result in ketoacidosis or comma; water retention that in patients with history of congestive heart failure may result in shortness of breath, pulmonary edema, and decompensation with resultant heart failure; weight gain; swelling or edema; medication-induced neural toxicity; particulate matter embolism and blood vessel occlusion with resultant organ, and/or nervous system infarction; and/or aseptic necrosis of one or more joints. Finally, the patient was informed that Medicine is not an exact science; therefore, there is also  the possibility of unforeseen or unpredictable risks and/or possible complications that may result in a catastrophic outcome. The patient indicated having understood very clearly. We have given the patient no guarantees and we have made no promises. Enough time was given to the patient to ask questions, all of which were answered to the patient's satisfaction. Mr. Fraticelli has indicated that he wanted to continue with the procedure. Attestation: I, the ordering provider, attest that I have discussed with the patient the benefits, risks, side-effects, alternatives, likelihood of achieving goals, and potential problems during recovery for the procedure that I have provided informed consent. Date  Time: 12/14/2019  9:32 AM  Pre-Procedure Preparation:  Monitoring: As per clinic protocol. Respiration, ETCO2, SpO2, BP, heart rate and rhythm monitor placed and checked for adequate function Safety Precautions: Patient was assessed for positional comfort and pressure points before starting the procedure. Time-out: I initiated and conducted the "Time-out" before starting the procedure, as per protocol. The patient was asked to participate by confirming the accuracy of the "Time Out" information. Verification of the correct person, site, and procedure were performed and confirmed by me, the nursing staff, and the patient. "Time-out" conducted as per Joint Commission's Universal Protocol (UP.01.01.01). Time: 0955  Description of Procedure:          Target Area: Intrathecal canal via interlaminar space, initially targeting the lower laminar border of the superior vertebral body. Approach: Paramedial approach. Area Prepped: Entire Posterior Lumbar Region DuraPrep (Iodine Povacrylex [0.7% available iodine] and Isopropyl Alcohol, 74% w/w) Safety Precautions: Aspiration looking for blood return was conducted prior to all injections. At no point did we inject any substances, as a needle was being advanced. No attempts  were made at seeking any paresthesias. Safe injection practices and needle disposal techniques used. Medications properly checked for expiration dates. SDV (single dose vial) medications used. Description of the Procedure: Protocol guidelines were followed.  After initially numbing the skin and subcutaneous tissue with local anesthetic infiltration, an 18-gauge, 1.5 inch introducer needle was used for the  purpose of bypassing the skin using a double needle technique.  The pencil point intrathecal needle was introduced through the 18-gauge needle and advanced to the target area. Bone was contacted and the needle walked caudad, until the lamina was cleared. The ligamentum flavum was crossed and the needle slowly advanced while gently aspirating using a 3 cc syringe, until CSF was obtained. Once I had confirmed intrathecal placement, then I proceeded to inject PF-Fentanyl, 25 mcg.  Careful attention was paid to t avoid triggering any sharp pains or paresthesias while injecting.  The patient tolerated the procedure well and and was transferred to the recovery area for evaluation of subsequent discharge.  Vitals:   12/14/19 0932 12/14/19 0954 12/14/19 0959 12/14/19 1001  BP: (!) 142/76 133/67 129/63 132/60  Pulse: 73     Resp: 14 16 17 17   Temp: 97.6 F (36.4 C)     TempSrc: Temporal     SpO2: 100% 100% 100% 100%  Weight: 160 lb (72.6 kg)     Height: 5\' 8"  (1.727 m)       Start Time: 0955 hrs. End Time: 1003 hrs.  Materials:  Needle(s) Type: Pencil Point spinal needle Gauge: 24G Length: 3.5-in Medication(s): Please see orders for medications and dosing details.  Imaging Guidance (Spinal):          Type of Imaging Technique: Fluoroscopy Guidance (Spinal) Indication(s): Assistance in needle guidance and placement for procedures requiring needle placement in or near specific anatomical locations not easily accessible without such assistance. Exposure Time: Please see nurses notes. Contrast:  Before injecting any contrast, we confirmed that the patient did not have an allergy to iodine, shellfish, or radiological contrast. Once satisfactory needle placement was completed at the desired level, radiological contrast was injected. Contrast injected under live fluoroscopy. No contrast complications. See chart for type and volume of contrast used. Fluoroscopic Guidance: I was personally present during the use of fluoroscopy. "Tunnel Vision Technique" used to obtain the best possible view of the target area. Parallax error corrected before commencing the procedure. "Direction-depth-direction" technique used to introduce the needle under continuous pulsed fluoroscopy. Once target was reached, antero-posterior, oblique, and lateral fluoroscopic projection used confirm needle placement in all planes. Images permanently stored in EMR. Interpretation: I personally interpreted the imaging intraoperatively. Adequate needle placement confirmed in multiple planes. Appropriate intrathecal spread of contrast observed, pooling in the anterior canal. No evidence of afferent or efferent intravascular uptake. Permanent images saved into the patient's record.  Antibiotic Prophylaxis:   Anti-infectives (From admission, onward)   None     Indication(s): None identified  Post-operative Assessment:  Post-procedure Vital Signs:  Pulse/HCG Rate: 7373 Temp: 97.6 F (36.4 C) Resp: 17 BP: 132/60 SpO2: 100 %  EBL: None  Complications: No immediate post-treatment complications observed by team, or reported by patient.  Note: The patient tolerated the entire procedure well. A repeat set of vitals were taken after the procedure and the patient was kept under observation following institutional policy, for this type of procedure. Post-procedural neurological assessment was performed, showing return to baseline, prior to discharge. The patient was provided with post-procedure discharge instructions, including a  section on how to identify potential problems. Should any problems arise concerning this procedure, the patient was given instructions to immediately contact us, at any time, without hesitation. In any case, we plan to contact the patient by telephone for a follow-up status report regarding this interventional procedure.  Comments:  No additional relevant information.  Plan of Care  Orders:  Orders Placed This Encounter  Procedures  . PUMP TRIAL    Standing Status:   Future    Standing Expiration Date:   06/15/2020    Scheduling Instructions:     Same day, outpatient trial. Schedule the patient to be the first case of the morning. Estimated for patient to be here for 4 to 6 hours. Have nurse order necessary medications. Have a spinal tray available.    Order Specific Question:   Where will this procedure be performed?    Answer:   [facility]  . DG PA[facility] C-ARM 1-60 MIN NO REPORT    Intraoperative interpretation by procedural physician at [place].    Standing Status:   Standing    Number of Occurrences:   1    Order Specific Question:   Reason for exam:    Answer:   Assistance in needle guidance and placement for procedures requiring needle placement in or near specific anatomical locations not easily accessible without such assistance.  . Informed Consent Details: Physician/Practitioner Attestation; Transcribe to consent form and obtain patient signature    Provider Attestation: I, [place] Dossie Arbour, MD, (Pain Management Specialist), the physician/practitioner, attest that I have discussed with the patient the benefits, risks, side effects, alternatives, likelihood of achieving goals and potential problems during recovery for the procedure that I have provided informed consent.    Scheduling Instructions:     Procedure: Intrathecal Pump Trial (Spinal injection of PF-Fentanyl)     Indication/Reason: Chronic Low Back and Lower Extremity Pain secondary to a  Failed Back Surgery Syndrome.     Nursing Order: Transcribe to consent form and obtain patient signature.     Note: Always confirm laterality of pain with Mr. Wisham, before procedure.  . Care order/instruction: Please confirm that the patient has stopped the Plavix (Clopidogrel) x 7-10 days prior to procedure or surgery.    Please confirm that the patient has stopped the Plavix (Clopidogrel) x 7-10 days prior to procedure or surgery.    Standing Status:   Standing    Number of Occurrences:   1  . Provide equipment / supplies at bedside    Equipment required: Single use, disposable, "Spinal Tray"    Standing Status:   Standing    Number of Occurrences:   1    Order Specific Question:   Specify    Answer:   Spinal Tray  . Bleeding precautions    Standing Status:   Standing    Number of Occurrences:   1   Chronic Opioid Analgesic:  No opioid analgesics prescribed by our practice. Oxycodone/APAP 5/325, 1 tab PO q 8 hrs (15 mg/day of oxycodone IR)  Highest recorded MME/day: 22.5 mg/day MME/day: 22.5 mg/day   Medications ordered for procedure: Meds ordered this encounter  Medications  . lidocaine (XYLOCAINE) 2 % (with pres) injection 400 mg  . fentaNYL (SUBLIMAZE) injection 25-50 mcg    Make sure Narcan is available in the pyxis when using this medication. In the event of respiratory depression (RR< 8/min): Titrate NARCAN (naloxone) in increments of 0.1 to 0.2 mg IV at 2-3 minute intervals, until desired degree of reversal.   Medications administered: We administered lidocaine and fentaNYL.  See the medical record for exact dosing, route, and time of administration.  Follow-up plan:   Return in 1 week (on 12/21/2019) for (VV), (PP) Follow-up, PM on Proc-day.       Interventional treatment options: Planned, scheduled, and/or pending:    NOTE:  PLAVIX ANTICOAGULATION  (Stop: 7 days  Restart: 2 hrs)    Under consideration:   Diagnostic intrathecal injection of opioid analgesic  (intrathecal pump trial).   Therapeutic/palliative (PRN):   Diagnostic bilateral celiac plexus block #3  (patient indicated no relief)    Recent Visits Date Type Provider Dept  11/18/19 Office Visit Milinda Pointer, MD [facility]  11/02/19 Procedure visit Milinda Pointer, MD [facility]  10/19/19 Office Visit Milinda Pointer, MD [facility]  Showing recent visits within past 90 days and meeting all other requirements Today's Visits Date Type Provider Dept  12/14/19 Procedure visit Milinda Pointer, MD [facility]  Showing today's visits and meeting all other requirements Future Appointments Date Type Provider Dept  12/21/19 Appointment Milinda Pointer, MD [facility]  Showing future appointments within next 90 days and meeting all other requirements  Disposition: Discharge home  Discharge (Date  Time): 12/14/2019; 1014 hrs.   Primary Care Physician: [name], MD Location: [facility] Note by: Gaspar Cola, MD Date: 12/14/2019; Time: 10:33 AM  Disclaimer:  Medicine is not an exact science. The only guarantee in medicine is that nothing is guaranteed. It is important to note that the decision to proceed with this intervention was based on the information collected from the patient. The Data and conclusions were drawn from the patient's questionnaire, the interview, and the physical examination. Because the information was provided in large part by the patient, it cannot be guaranteed that it has not been purposely or unconsciously manipulated. Every effort has been made to obtain as much relevant data as possible for this evaluation. It is important to note that the conclusions that lead to this procedure are derived in large part from the available data. Always take into account that the treatment will also be dependent on availability of resources and existing treatment  guidelines, considered by other Pain Management Practitioners as being common knowledge and practice, at the time of the intervention. For Medico-Legal purposes, it is also important to point out that variation in procedural techniques and pharmacological choices are the acceptable norm. The indications, contraindications, technique, and results of the above procedure should only be interpreted and judged by a Board-Certified Interventional Pain Specialist with extensive familiarity and expertise in the same exact procedure and technique.

## 2019-12-15 ENCOUNTER — Telehealth: Payer: Self-pay

## 2019-12-15 NOTE — Telephone Encounter (Signed)
Post procedure phone call.  Patient states he is doing good.  

## 2019-12-16 ENCOUNTER — Telehealth: Payer: Self-pay | Admitting: Pain Medicine

## 2019-12-16 NOTE — Telephone Encounter (Signed)
Nathaneil Canary [phone] care giver for patient James [name]  Called stating the patietn says there is no change in his pain lvls with the pain pump. He is continuing to take his pain medication. Is this ok for patient to continue pain meds?

## 2019-12-17 NOTE — Telephone Encounter (Signed)
Patient notified per voicemail that patient may continue to take his usual pain medications as prescribed.

## 2019-12-20 ENCOUNTER — Other Ambulatory Visit: Payer: Self-pay

## 2019-12-20 ENCOUNTER — Inpatient Hospital Stay (HOSPITAL_BASED_OUTPATIENT_CLINIC_OR_DEPARTMENT_OTHER): Payer: Medicare Other | Admitting: Hospice and Palliative Medicine

## 2019-12-20 ENCOUNTER — Encounter: Payer: Self-pay | Admitting: Pain Medicine

## 2019-12-20 DIAGNOSIS — Z515 Encounter for palliative care: Secondary | ICD-10-CM | POA: Diagnosis not present

## 2019-12-20 DIAGNOSIS — I70219 Atherosclerosis of native arteries of extremities with intermittent claudication, unspecified extremity: Secondary | ICD-10-CM | POA: Diagnosis not present

## 2019-12-20 DIAGNOSIS — C9110 Chronic lymphocytic leukemia of B-cell type not having achieved remission: Secondary | ICD-10-CM

## 2019-12-20 DIAGNOSIS — G893 Neoplasm related pain (acute) (chronic): Secondary | ICD-10-CM

## 2019-12-20 MED ORDER — MORPHINE SULFATE ER 15 MG PO TBCR: 15.0000 mg | EXTENDED_RELEASE_TABLET | Freq: Two times a day (BID) | ORAL | 0 refills | Status: DC

## 2019-12-20 NOTE — Progress Notes (Signed)
Virtual Visit via Video Note  I connected with [name] on 12/20/19 at 10:30 AM EDT by a video enabled telemedicine application and verified that I am speaking with the correct person using two identifiers.   I discussed the limitations of evaluation and management by telemedicine and the availability of in person appointments. The patient expressed understanding and agreed to proceed.  History of Present Illness: James [name] is a 69 y.o. male with multiple medical problems including stage IV CLL (initially diagnosed in 2006) most recently treated with ibrutinib but was discontinued due to poor tolerance. He is now on treatment with Gazyva. Patient has had severe and chronic abdominal pain of unclear etiology. He has had extensive work-up including referral to [facility]. He has been followed by interventional pain management and is status post celiac plexus blocks without significant relief in symptoms. He was referred to palliative care to help address goals and manage ongoing symptoms..    Observations/Objective: Patient did not respond to prompts for MyChart visit.  I called and spoke with him instead.  He reports he is doing about the same.  He denies any significant changes or concerns today.  He continues to endorse persistent but generalized abdominal pain.  He continues to take MS Contin 15 mg every 12 hours and Percocet every 6 hours as needed.  He requests refill today of MS Contin.  PDMP reviewed.  Patient sees Dr. Dossie Arbour tomorrow.   Assessment and Plan: Stage IV CLL -currently on Gazyva and followed by Dr. Rogue Bussing  Chronic pain -on MS Contin 15 mg every 12 hours (#60 tablets).  Continue Percocet 5 325 mg every 6 hours as needed for breakthrough pain.  Follow Up Instructions: Follow-up MyChart visit about a month   I discussed the assessment and treatment plan with the patient. The patient was provided an opportunity to ask questions and all were answered. The patient  agreed with the plan and demonstrated an understanding of the instructions.   The patient was advised to call back or seek an in-person evaluation if the symptoms worsen or if the condition fails to improve as anticipated.  I provided 7  minutes of non-face-to-face time during this encounter.   Irean Hong, NP

## 2019-12-21 ENCOUNTER — Ambulatory Visit: Payer: Medicare Other | Attending: Pain Medicine | Admitting: Pain Medicine

## 2019-12-21 DIAGNOSIS — R1013 Epigastric pain: Secondary | ICD-10-CM

## 2019-12-21 DIAGNOSIS — R109 Unspecified abdominal pain: Secondary | ICD-10-CM | POA: Diagnosis not present

## 2019-12-21 DIAGNOSIS — G894 Chronic pain syndrome: Secondary | ICD-10-CM | POA: Diagnosis not present

## 2019-12-21 DIAGNOSIS — G8929 Other chronic pain: Secondary | ICD-10-CM

## 2019-12-21 DIAGNOSIS — G893 Neoplasm related pain (acute) (chronic): Secondary | ICD-10-CM | POA: Diagnosis not present

## 2019-12-21 DIAGNOSIS — R1084 Generalized abdominal pain: Secondary | ICD-10-CM

## 2019-12-21 DIAGNOSIS — C8300 Small cell B-cell lymphoma, unspecified site: Secondary | ICD-10-CM

## 2019-12-21 NOTE — Progress Notes (Signed)
Patient: James [name]  Service Category: E/M  Provider: Gaspar Cola, MD  DOB: Feb 01, 1951  DOS: 12/21/2019  Location: Office  MRN: [id number]  Setting: Ambulatory outpatient  Referring Provider: [name], MD  Type: Established Patient  Specialty: Interventional Pain Management  PCP: [name], MD  Location: Remote location  Delivery: TeleHealth     Virtual Encounter - Pain Management PROVIDER NOTE: Information contained herein reflects review and annotations entered in association with encounter. Interpretation of such information and data should be left to medically-trained personnel. Information provided to patient can be located elsewhere in the medical record under "Patient Instructions". Document created using STT-dictation technology, any transcriptional errors that may result from process are unintentional.    Contact & Pharmacy Preferred: [phone] Home: [phone] (home) Mobile: [phone] (mobile) E-mail: No e-mail address on record  [place] ([address] [place]) [place] [address] Phone: [phone] Fax: [place], [place] [address] Phone: [phone] Fax: [phone]   Pre-screening  James [name] offered "in-person" vs "virtual" encounter. He indicated preferring virtual for this encounter.   Reason COVID-19*  Social distancing based on CDC and AMA recommendations.   I contacted James [name] on 12/21/2019 via telephone.      I clearly identified myself as Gaspar Cola, MD. I verified that I was speaking with the correct person using two identifiers (Name: James [name], and date of birth: 05-27-1950).  Consent I sought verbal advanced consent from James [name] for virtual visit interactions. I informed James [name] of possible security and privacy  concerns, risks, and limitations associated with providing "not-in-person" medical evaluation and management services. I also informed James [name] of the availability of "in-person" appointments. Finally, I informed him that there would be a charge for the virtual visit and that he could be  personally, fully or partially, financially responsible for it. James [name] expressed understanding and agreed to proceed.   Historic Elements   James [name] is a 69 y.o. year old, male patient evaluated today after his last contact with our practice on 12/16/2019. James [name]  has a past medical history of CLL (chronic lymphocytic leukemia) ([place]), Constipation, Depression, Diabetes mellitus without complication ([place]), Hematuria, Hyperlipidemia, Hypertension, and Therapeutic opioid induced constipation. He also  has a past surgical history that includes Cholecystectomy (1983); Esophagogastroduodenoscopy (egd) with propofol (N/A, 11/20/2016); Cataract extraction w/ intraocular lens implant (Bilateral); Lower Extremity Angiography (Right, 05/19/2017); Esophagogastroduodenoscopy (egd) with propofol (N/A, 10/27/2017); Colonoscopy with propofol (N/A, 10/27/2017); and Lower Extremity Angiography (Left, 08/10/2018). James [name] has a current medication list which includes the following prescription(s): acyclovir, aspirin ec, atorvastatin, calcium plus d3 absorbable, vitamin d3, clopidogrel, dexamethasone, ensure, folic acid, iron polysaccharides, metformin, montelukast, naloxegol oxalate, oxycodone-acetaminophen, and morphine. He  reports that he has been smoking cigarettes. He has a 23.50 pack-year smoking history. He has never used smokeless tobacco. He reports current alcohol use of about 1.0 standard drink of alcohol per week. He reports that he does not use drugs. James [name] has No Known Allergies.   HPI  Today, he is being contacted for a post-procedure assessment.  According to the patient he had absolutely no pain for the first 24  hours and after that it went down to about an 80% relief of the pain that lasted 3 to 4 days.  This is rather impressive since  it was a single shot of 25 mcg of fentanyl.  I believe that he would probably be a good candidate for the intrathecal pump and I have communicated this to the patient today.  He asked me what it would involve to get the pump and I informed him that we would probably send him to a neurosurgeon to put the pump in and then we would follow-up with the refills.  I told him that ideally we can have those refills every 90 days or so.  He indicated that he was interested, but he needs to talk to his family about this.  Post-Procedure Evaluation  Procedure (12/16/2019): Diagnostic midline T12-L1 intrathecal injection of preservative-free fentanyl (25 mcg) under fluoroscopic guidance, no sedation. Pre-procedure pain level: 4/10 Post-procedure: 1/10 (> 50% relief)  Sedation: None.  Effectiveness during initial hour after procedure(Ultra-Short Term Relief): 100 %.  Local anesthetic used: Long-acting (4-6 hours) Effectiveness: Defined as any analgesic benefit obtained secondary to the administration of local anesthetics. This carries significant diagnostic value as to the etiological location, or anatomical origin, of the pain. Duration of benefit is expected to coincide with the duration of the local anesthetic used.  Effectiveness during initial 4-6 hours after procedure(Short-Term Relief): 100 %.  Long-term benefit: Defined as any relief past the pharmacologic duration of the local anesthetics.  Effectiveness past the initial 6 hours after procedure(Long-Term Relief): 80 % (last for 3 to 4 days, pain back as before).  Current benefits: Defined as benefit that persist at this time.   Analgesia:  75-80% better for approximately 3 to 4 days. Function: Back to baseline ROM: Back to baseline  Pharmacotherapy Assessment  Analgesic: No opioid analgesics prescribed by our practice.  Oxycodone/APAP 5/325, 1 tab PO q 8 hrs (15 mg/day of oxycodone IR)  Highest recorded MME/day: 22.5 mg/day MME/day: 22.5 mg/day   Monitoring: [place] PMP: PDMP reviewed during this encounter.       Pharmacotherapy: No side-effects or adverse reactions reported. Compliance: No problems identified. Effectiveness: Clinically acceptable. Plan: Refer to "POC".  UDS: No results found for: SUMMARY  Laboratory Chemistry Profile   Renal Lab Results  Component Value Date   BUN 27 (H) 12/07/2019   CREATININE 1.12 12/07/2019   GFRAA >60 12/07/2019   GFRNONAA >60 12/07/2019     Hepatic Lab Results  Component Value Date   AST 12 (L) 12/07/2019   ALT 11 12/07/2019   ALBUMIN 4.3 12/07/2019   ALKPHOS 63 12/07/2019   HCVAB NON REACTIVE 06/03/2019   AMYLASE 22 (L) 07/16/2019   LIPASE 23 09/12/2019     Electrolytes Lab Results  Component Value Date   NA 139 12/07/2019   K 5.2 (H) 12/07/2019   CL 103 12/07/2019   CALCIUM 9.3 12/07/2019   MG 2.4 06/03/2019   PHOS 4.4 06/03/2019     Bone Lab Results  Component Value Date   VD25OH 13.74 (L) 05/11/2019     Inflammation (CRP: Acute Phase) (ESR: Chronic Phase) Lab Results  Component Value Date   CRP 0.6 05/11/2019   ESRSEDRATE 39 (H) 05/11/2019   LATICACIDVEN 1.7 07/16/2019       Note: Above Lab results reviewed.   Imaging  DG PAIN CLINIC C-ARM 1-60 MIN NO REPORT Fluoro was used, but no Radiologist interpretation will be provided.  Please refer to "NOTES" tab for provider progress note.  Assessment  The primary encounter diagnosis was Chronic pain syndrome. Diagnoses of Cancer-related pain, Chronic epigastric pain (1ry area of Pain), Chronic abdominal pain, Generalized  abdominal pain, and Malignant lymphoma, small lymphocytic ([place]) were also pertinent to this visit.  Plan of Care  Problem-specific:  No problem-specific Assessment & Plan notes found for this encounter.  Mr. ZAMIR STAPLES has a current medication list which  includes the following long-term medication(s): atorvastatin, calcium plus d3 absorbable, vitamin d3, iron polysaccharides, metformin, and montelukast.  Pharmacotherapy (Medications Ordered): No orders of the defined types were placed in this encounter.  Orders:  Orders Placed This Encounter  Procedures  . Ambulatory referral to Neurosurgery    Referral Priority:   Routine    Referral Type:   Surgical    Referral Reason:   Specialty Services Required    Referred to Provider:   Deetta Perla, MD    Requested Specialty:   Neurosurgery    Number of Visits Requested:   1   Follow-up plan:   Return if symptoms worsen or fail to improve, for Referral to Dr. Deetta Perla for intrathecal pump implant.      Interventional treatment options: Planned, scheduled, and/or pending:    NOTE: PLAVIX ANTICOAGULATION  (Stop: 7 days  Restart: 2 hrs)    Under consideration:   Diagnostic intrathecal injection of opioid analgesic (intrathecal pump trial).   Therapeutic/palliative (PRN):   Diagnostic bilateral celiac plexus block #3  (patient indicated no relief)     Recent Visits Date Type Provider Dept  12/14/19 Procedure visit Milinda Pointer, MD [facility]  11/18/19 Office Visit Milinda Pointer, MD [facility]  11/02/19 Procedure visit Milinda Pointer, MD [facility]  10/19/19 Office Visit Milinda Pointer, MD [facility]  Showing recent visits within past 90 days and meeting all other requirements Today's Visits Date Type Provider Dept  12/21/19 Telemedicine Milinda Pointer, MD [facility]  Showing today's visits and meeting all other requirements Future Appointments No visits were found meeting these conditions. Showing future appointments within next 90 days and meeting all other requirements  I discussed the assessment and treatment plan with the patient. The patient was provided an opportunity to ask questions and all were  answered. The patient agreed with the plan and demonstrated an understanding of the instructions.  Patient advised to call back or seek an in-person evaluation if the symptoms or condition worsens.  Duration of encounter: 15 minutes.  Note by: Gaspar Cola, MD Date: 12/21/2019; Time: 5:10 PM

## 2019-12-22 ENCOUNTER — Telehealth: Payer: Self-pay | Admitting: *Deleted

## 2019-12-22 DIAGNOSIS — J312 Chronic pharyngitis: Secondary | ICD-10-CM

## 2019-12-22 NOTE — Telephone Encounter (Signed)
Patient called asking to speak with Sharion Dove, NP regarding a referral to ENT. Please return his call to home or cell number

## 2019-12-22 NOTE — Telephone Encounter (Signed)
I spoke with patient. He reports chronic mild sore throat. No difficulty swallowing. No reported lesions or oral pain. Says this has persisted some weeks/months. He would like ENT referral for eval. Dr. Jacinto Reap - is that okay with you?

## 2019-12-22 NOTE — Telephone Encounter (Signed)
Josh-ENT referral is ok with me.  Thanks

## 2019-12-23 NOTE — Telephone Encounter (Signed)
Referral faxed to [facility] 

## 2019-12-24 ENCOUNTER — Telehealth: Payer: Self-pay | Admitting: *Deleted

## 2019-12-24 NOTE — Telephone Encounter (Signed)
-----   Message from [place] sent at 12/24/2019  8:41 AM EDT ----- Regarding: [facility] ENT APPT CONFIRMATION Hello,   I just uploaded an [facility] ENT APPT CONFIRMATION  into this pts chart in Epic under the media tab. This fax is regarding his referral to their office. His appt has been made with Dr. Richardson Landry for 12/24/19 @ 3:15 pm.  Thanks!

## 2020-01-04 ENCOUNTER — Encounter: Payer: Self-pay | Admitting: Internal Medicine

## 2020-01-04 ENCOUNTER — Other Ambulatory Visit: Payer: Self-pay

## 2020-01-04 ENCOUNTER — Inpatient Hospital Stay: Payer: Medicare Other | Attending: Internal Medicine

## 2020-01-04 ENCOUNTER — Inpatient Hospital Stay (HOSPITAL_BASED_OUTPATIENT_CLINIC_OR_DEPARTMENT_OTHER): Payer: Medicare Other | Admitting: Internal Medicine

## 2020-01-04 ENCOUNTER — Inpatient Hospital Stay: Payer: Medicare Other

## 2020-01-04 VITALS — BP 153/67 | HR 68 | Temp 98.3°F | Resp 16 | Ht 68.0 in | Wt 166.4 lb

## 2020-01-04 DIAGNOSIS — C9110 Chronic lymphocytic leukemia of B-cell type not having achieved remission: Secondary | ICD-10-CM

## 2020-01-04 DIAGNOSIS — N183 Chronic kidney disease, stage 3 unspecified: Secondary | ICD-10-CM

## 2020-01-04 DIAGNOSIS — D631 Anemia in chronic kidney disease: Secondary | ICD-10-CM | POA: Insufficient documentation

## 2020-01-04 DIAGNOSIS — Z79899 Other long term (current) drug therapy: Secondary | ICD-10-CM | POA: Insufficient documentation

## 2020-01-04 DIAGNOSIS — E875 Hyperkalemia: Secondary | ICD-10-CM

## 2020-01-04 DIAGNOSIS — N184 Chronic kidney disease, stage 4 (severe): Secondary | ICD-10-CM

## 2020-01-04 DIAGNOSIS — I70219 Atherosclerosis of native arteries of extremities with intermittent claudication, unspecified extremity: Secondary | ICD-10-CM | POA: Diagnosis not present

## 2020-01-04 LAB — BASIC METABOLIC PANEL
Anion gap: 11 (ref 5–15)
BUN: 36 mg/dL — ABNORMAL HIGH (ref 8–23)
CO2: 26 mmol/L (ref 22–32)
Calcium: 9.2 mg/dL (ref 8.9–10.3)
Chloride: 102 mmol/L (ref 98–111)
Creatinine, Ser: 1.16 mg/dL (ref 0.61–1.24)
GFR calc Af Amer: >60 mL/min (ref >60)
GFR calc non Af Amer: >60 mL/min (ref >60)
Glucose, Bld: 163 mg/dL — ABNORMAL HIGH (ref 70–99)
Potassium: 4.9 mmol/L (ref 3.5–5.1)
Sodium: 139 mmol/L (ref 135–145)

## 2020-01-04 LAB — CBC WITH DIFFERENTIAL/PLATELET
Abs Immature Granulocytes: 0.08 10*3/uL — ABNORMAL HIGH (ref 0.00–0.07)
Basophils Absolute: 0 10*3/uL (ref 0.0–0.1)
Basophils Relative: 0 %
Eosinophils Absolute: 0 10*3/uL (ref 0.0–0.5)
Eosinophils Relative: 0 %
HCT: 29.7 % — ABNORMAL LOW (ref 39.0–52.0)
Hemoglobin: 9.4 g/dL — ABNORMAL LOW (ref 13.0–17.0)
Immature Granulocytes: 1 %
Lymphocytes Relative: 27 %
Lymphs Abs: 2.8 10*3/uL (ref 0.7–4.0)
MCH: 33.6 pg (ref 26.0–34.0)
MCHC: 31.6 g/dL (ref 30.0–36.0)
MCV: 106.1 fL — ABNORMAL HIGH (ref 80.0–100.0)
Monocytes Absolute: 0.9 10*3/uL (ref 0.1–1.0)
Monocytes Relative: 9 %
Neutro Abs: 6.5 10*3/uL (ref 1.7–7.7)
Neutrophils Relative %: 63 %
Platelets: 193 10*3/uL (ref 150–400)
RBC: 2.8 MIL/uL — ABNORMAL LOW (ref 4.22–5.81)
RDW: 16.3 % — ABNORMAL HIGH (ref 11.5–15.5)
WBC: 10.3 10*3/uL (ref 4.0–10.5)
nRBC: 0 % (ref 0.0–0.2)

## 2020-01-04 MED ORDER — DARBEPOETIN ALFA 300 MCG/0.6ML IJ SOSY
300.0000 ug | PREFILLED_SYRINGE | Freq: Once | INTRAMUSCULAR | Status: AC
  Administered 2020-01-04: 300 ug via SUBCUTANEOUS
  Filled 2020-01-04: qty 0.6

## 2020-01-04 NOTE — Assessment & Plan Note (Addendum)
#   CLL/SLL- relapsed most recently on ibrutinib; FEB 2021- CT Ab/Pelvis-CT scan chest and pelvis shows significant progression of axillary/mediastinal/abdominal/pelvic-however the largest lesion approximately inch in size. MARCH 2021- ~ 2.5 cm RP LN; multiple smaller LN.  Currently on [place]  s/p  #6 of [place].  Clinically stable.  Will order CT scan today to assess response.  #Chronic abdominal pain-unclear etiology-stable [ urine porphyrins were slightly abnormal ; plasma fractionated porphyrin- Normal]- awaiting pain clinic;   #Hyperkalemia-5.3/CKD-III-secondary renal insufficiency/creatinine 1.3- [baseline 1.4; Dr.Lateef].STABLE.   # Anemia-hemoglobin 9.6 ;Secondary to CKD versus progressive leukemia -stable proceed with Aranesp today.   # Urinary pressure- prostate enlargement on CT scan- UA-NEG; PSA pending; Appt with Dr.Wolff this week.   # DISPOSITION:  # Aranesp today # 4 week- MD; labs- cbc/cmpLDH; HOLD tube;possible aranesp;CT Chest/A/P prior- Dr.B

## 2020-01-04 NOTE — Progress Notes (Signed)
Should there cone James [name] OFFICE PROGRESS NOTE  Patient Care Team: [name], MD as PCP - General (Internal Medicine) Cammie Sickle, MD as Consulting Physician (Internal Medicine)  Cancer Staging No matching staging information was found for the patient.   Oncology History Overview Note  # 2006- CLL STAGE IV; MAY 2011- WBC- 57K;Platelets-99;Hb-12/CT Bulky LN; BMBx- 80% Invol; del 11; START Benda-Ritux x4 [finished Sep 2011];   # July 2015-Progression; Sep 2015-START ibrutinib; CT scan DEC 2015- Improvement LN; Cont Ibrutinib 116m/d; NOV 2016 CT- 1-2CM LN [mild progression compared to Dec 2015];NOV 2016- FISH peripheral blood- NO MUTATIONS/CD-38 Positive; NOV 7th- CONT IBRUTINIB 2 pills/day; CT AUG 2017- STABLE;  DEC 6th PET- Mild RP LN/ Retrocrural LN  # OFF ibrutinib [? intol]- sep 2019- Jan 16th 2020; Re-start Ibrutinib; September 2020-stop ibrutinib [poor tolerance/worsening anemia]  # Jan 16th 2020- start aranesp; HOLD while on James [name]   # MARCH 11th 2021-James [name]  # DEC 2019- PAIN CONTRACT  # PALLIATIVE CARE- 06/21/2019-  # October 2019-bone marrow biopsy [worsening anemia]-question dyserythropoietic changes/small clone of CLL;  # [facility] HEM- NEG.  SA skin infection [Oct 2016] s/p clinda -------------------------------------------------------    DIAGNOSIS: CLL  STAGE:   IV      ;GOALS: palliative  CURRENT/MOST RECENT THERAPY : James [name] [C]   [name]  07/08/2019 -  Chemotherapy   The patient had obinutuzumab (James [name]) 100 mg in sodium chloride 0.9 % 100 mL (0.9615 mg/mL) chemo infusion, 100 mg, Intravenous, Once, 6 of 6 cycles Administration: 100 mg (07/08/2019), 900 mg (07/09/2019), 1,000 mg (07/26/2019), 1,000 mg (08/16/2019), 1,000 mg (08/02/2019), 1,000 mg (09/13/2019), 1,000 mg (10/11/2019), 1,000 mg (11/09/2019), 1,000 mg (12/07/2019)  for chemotherapy treatment.      INTERVAL HISTORY:  James [name] 69y.o.  male pleasant patient above progressive CLL and chronic abdominal pain of unclear etiology; anemia- ckd/hyperkalemia currently GDyann Kiefis here for follow-up.  Patient continues to have difficulty with urination; awaiting evaluation with urology this week.  However recent UA negative for any infection.  Continues to have chronic abdominal pain.  Not any worse.  Continues with narcotic pain medication.  No unusual weight loss.   Review of Systems  Constitutional: Positive for malaise/fatigue. Negative for chills, diaphoresis and fever.  HENT: Negative for nosebleeds and sore throat.   Eyes: Negative for double vision.  Respiratory: Positive for shortness of breath. Negative for cough, hemoptysis, sputum production and wheezing.   Cardiovascular: Negative for chest pain, palpitations, orthopnea and leg swelling.  Gastrointestinal: Positive for abdominal pain, constipation and nausea. Negative for blood in stool, diarrhea, heartburn, melena and vomiting.  Genitourinary: Negative for dysuria, frequency and urgency.  Skin: Negative.  Negative for itching and rash.  Neurological: Negative for dizziness, tingling, focal weakness, weakness and headaches.  Endo/Heme/Allergies: Does not bruise/bleed easily.  Psychiatric/Behavioral: Negative for depression. The patient is not nervous/anxious and does not have insomnia.       PAST MEDICAL HISTORY :  Past Medical History:  Diagnosis Date  . [place]   . Constipation   . Depression   . Diabetes mellitus without complication ([place]   . Hematuria   . Hyperlipidemia   . Hypertension   . Therapeutic opioid induced constipation     PAST SURGICAL HISTORY :   Past Surgical History:  Procedure Laterality Date  . CATARACT EXTRACTION W/ INTRAOCULAR LENS IMPLANT Bilateral   . CHOLECYSTECTOMY  1983  . COLONOSCOPY WITH  PROPOFOL N/A 10/27/2017   Procedure: COLONOSCOPY WITH PROPOFOL;  Surgeon: Manya Silvas, MD;   Location: [facility] ENDOSCOPY;  Service: Endoscopy;  Laterality: N/A;  . ESOPHAGOGASTRODUODENOSCOPY (EGD) WITH PROPOFOL N/A 11/20/2016   Procedure: ESOPHAGOGASTRODUODENOSCOPY (EGD) WITH PROPOFOL;  Surgeon: Manya Silvas, MD;  Location: [facility] ENDOSCOPY;  Service: Endoscopy;  Laterality: N/A;  . ESOPHAGOGASTRODUODENOSCOPY (EGD) WITH PROPOFOL N/A 10/27/2017   Procedure: ESOPHAGOGASTRODUODENOSCOPY (EGD) WITH PROPOFOL;  Surgeon: Manya Silvas, MD;  Location: [facility] ENDOSCOPY;  Service: Endoscopy;  Laterality: N/A;  . LOWER EXTREMITY ANGIOGRAPHY Right 05/19/2017   Procedure: LOWER EXTREMITY ANGIOGRAPHY;  Surgeon: Algernon Huxley, MD;  Location: [place] CV LAB;  Service: Cardiovascular;  Laterality: Right;  . LOWER EXTREMITY ANGIOGRAPHY Left 08/10/2018   Procedure: LOWER EXTREMITY ANGIOGRAPHY;  Surgeon: Algernon Huxley, MD;  Location: [place] CV LAB;  Service: Cardiovascular;  Laterality: Left;    FAMILY HISTORY :   Family History  Problem Relation Age of Onset  . Hypertension Sister     SOCIAL HISTORY:   Social History   Tobacco Use  . Smoking status: Current Every Day Smoker    Packs/day: 0.50    Years: 47.00    Pack years: 23.50    Types: Cigarettes  . Smokeless tobacco: Never Used  Vaping Use  . Vaping Use: Never used  Substance Use Topics  . Alcohol use: Yes    Alcohol/week: 1.0 standard drink    Types: 1 Glasses of wine per week    Comment:  almost none in last 6 months  . Drug use: No    ALLERGIES:  has No Known Allergies.  MEDICATIONS:  Current Outpatient Medications  Medication Sig Dispense Refill  . acyclovir (ZOVIRAX) 400 MG tablet One pill a day [to prevent shingles] 30 tablet 3  . aspirin EC 81 MG tablet Take 81 mg by mouth daily.    Marland Kitchen atorvastatin (LIPITOR) 10 MG tablet Take 1 tablet (10 mg total) by mouth daily. 30 tablet 11  . Calcium Carb-Cholecalciferol (CALCIUM PLUS D3 ABSORBABLE) [phone] MG-UNIT CAPS Take 1 capsule by mouth 2 (two) times daily with a meal.  60 capsule 5  . Cholecalciferol (VITAMIN D3) 125 MCG (5000 UT) CAPS Take 1 capsule (5,000 Units total) by mouth daily with breakfast. Take along with calcium and magnesium. 90 capsule 1  . clopidogrel (PLAVIX) 75 MG tablet Take 1 tablet by mouth once daily 90 tablet 0  . dexamethasone (DECADRON) 4 MG tablet Start 2 days prior to infusion; Take for 2 days. Do not take on the day of infusion. 60 tablet 3  . Ensure (ENSURE) Take 237 mLs by mouth.    . folic acid (FOLVITE) 1 MG tablet Take 1 tablet (1 mg total) by mouth daily. 90 tablet 1  . iron polysaccharides (NU-IRON) 150 MG capsule Take 1 capsule (150 mg total) by mouth daily. (Patient taking differently: Take 150 mg by mouth every other day. ) 30 capsule 1  . metFORMIN (GLUCOPHAGE) 1000 MG tablet Take 1,000 mg by mouth daily with breakfast.     . montelukast (SINGULAIR) 10 MG tablet Take 1 tablet (10 mg total) by mouth at bedtime. Start 2 days prior to infusion. Take it for 4 days. 60 tablet 0  . morphine (MS CONTIN) 15 MG 12 hr tablet Take 1 tablet (15 mg total) by mouth every 12 (twelve) hours. 60 tablet 0  . naloxegol oxalate (MOVANTIK) 25 MG TABS tablet Take 1 tablet (25 mg total) by mouth daily. 30 tablet  0  . oxyCODONE-acetaminophen (PERCOCET/ROXICET) 5-325 MG tablet Take 1 tablet by mouth every 6 (six) hours as needed for severe pain. 90 tablet 0   No current facility-administered medications for this visit.    PHYSICAL EXAMINATION: ECOG PERFORMANCE STATUS: 1 - Symptomatic but completely ambulatory  BP (!) 153/67 (BP Location: Left Arm, Patient Position: Sitting, Cuff Size: Normal)   Pulse 68   Temp 98.3 F (36.8 C) (Tympanic)   Resp 16   Ht 5' 8"  (1.727 m)   Wt 166 lb 6.4 oz (75.5 kg)   SpO2 100%   BMI 25.30 kg/m   Filed Weights   01/04/20 1013  Weight: 166 lb 6.4 oz (75.5 kg)    Physical Exam Constitutional:      Comments: He is alone.  Appears pale.  HENT:     Head: Normocephalic and atraumatic.     Mouth/Throat:      Pharynx: No oropharyngeal exudate.  Eyes:     Pupils: Pupils are equal, round, and reactive to light.  Cardiovascular:     Rate and Rhythm: Normal rate and regular rhythm.  Pulmonary:     Effort: No respiratory distress.     Breath sounds: No wheezing.  Abdominal:     General: Bowel sounds are normal. There is no distension.     Palpations: Abdomen is soft. There is no mass.     Tenderness: There is no abdominal tenderness. There is no guarding or rebound.  Musculoskeletal:        General: No tenderness. Normal range of motion.     Cervical back: Normal range of motion and neck supple.  Skin:    General: Skin is warm.     Coloration: Skin is pale.     Comments: Multiple bruises noted in bilateral upper extremity.  Neurological:     Mental Status: He is alert and oriented to person, place, and time.  Psychiatric:        Mood and Affect: Affect normal.       LABORATORY DATA:  I have reviewed the data as listed    Component Value Date/Time   NA 139 01/04/2020 0932   NA 142 05/03/2014 1139   K 4.9 01/04/2020 0932   K 4.7 05/03/2014 1139   CL 102 01/04/2020 0932   CL 110 (H) 05/03/2014 1139   CO2 26 01/04/2020 0932   CO2 24 05/03/2014 1139   GLUCOSE 163 (H) 01/04/2020 0932   GLUCOSE 98 05/03/2014 1139   BUN 36 (H) 01/04/2020 0932   BUN 20 (H) 05/03/2014 1139   CREATININE 1.16 01/04/2020 0932   CREATININE 1.22 08/09/2014 1122   CALCIUM 9.2 01/04/2020 0932   CALCIUM 8.6 05/03/2014 1139   PROT 7.0 12/07/2019 0812   PROT 7.5 05/03/2014 1139   ALBUMIN 4.3 12/07/2019 0812   ALBUMIN 4.1 05/03/2014 1139   AST 12 (L) 12/07/2019 0812   AST 15 05/03/2014 1139   ALT 11 12/07/2019 0812   ALT 26 05/03/2014 1139   ALKPHOS 63 12/07/2019 0812   ALKPHOS 94 05/03/2014 1139   BILITOT 0.4 12/07/2019 0812   BILITOT 0.5 05/03/2014 1139   GFRNONAA >60 01/04/2020 0932   GFRNONAA >60 08/09/2014 1122   GFRAA >60 01/04/2020 0932   GFRAA >60 08/09/2014 1122    No results found  for: SPEP, UPEP  Lab Results  Component Value Date   WBC 10.3 01/04/2020   NEUTROABS 6.5 01/04/2020   HGB 9.4 (L) 01/04/2020   HCT 29.7 (L) 01/04/2020  MCV 106.1 (H) 01/04/2020   PLT 193 01/04/2020      Chemistry      Component Value Date/Time   NA 139 01/04/2020 0932   NA 142 05/03/2014 1139   K 4.9 01/04/2020 0932   K 4.7 05/03/2014 1139   CL 102 01/04/2020 0932   CL 110 (H) 05/03/2014 1139   CO2 26 01/04/2020 0932   CO2 24 05/03/2014 1139   BUN 36 (H) 01/04/2020 0932   BUN 20 (H) 05/03/2014 1139   CREATININE 1.16 01/04/2020 0932   CREATININE 1.22 08/09/2014 1122      Component Value Date/Time   CALCIUM 9.2 01/04/2020 0932   CALCIUM 8.6 05/03/2014 1139   ALKPHOS 63 12/07/2019 0812   ALKPHOS 94 05/03/2014 1139   AST 12 (L) 12/07/2019 0812   AST 15 05/03/2014 1139   ALT 11 12/07/2019 0812   ALT 26 05/03/2014 1139   BILITOT 0.4 12/07/2019 0812   BILITOT 0.5 05/03/2014 1139       RADIOGRAPHIC STUDIES: I have personally reviewed the radiological images as listed and agreed with the findings in the report. No results found.   ASSESSMENT & PLAN:  CLL (chronic lymphocytic leukemia) ([place]) # CLL/SLL- relapsed most recently on ibrutinib; FEB 2021- CT Ab/Pelvis-CT scan chest and pelvis shows significant progression of axillary/mediastinal/abdominal/pelvic-however the largest lesion approximately inch in size. MARCH 2021- ~ 2.5 cm RP LN; multiple smaller LN.  Currently on [place]  s/p  #6 of James [name].  Clinically stable.  Will order CT scan today to assess response.  #Chronic abdominal pain-unclear etiology-stable [ urine porphyrins were slightly abnormal ; plasma fractionated porphyrin- Normal]- awaiting pain clinic;   #Hyperkalemia-5.3/CKD-III-secondary renal insufficiency/creatinine 1.3- [baseline 1.4; Dr.Lateef].STABLE.   # Anemia-hemoglobin 9.6 ;Secondary to CKD versus progressive leukemia -stable proceed with Aranesp today.   # Urinary pressure- prostate enlargement  on CT scan- UA-NEG; PSA pending; Appt with Dr.Wolff this week.   # DISPOSITION:  # Aranesp today # 4 week- MD; labs- cbc/cmpLDH; HOLD tube;possible aranesp;CT Chest/A/P prior- Dr.B   Orders Placed This Encounter  Procedures  . CT CHEST ABDOMEN PELVIS W CONTRAST    Standing Status:   Future    Standing Expiration Date:   01/03/2021    Order Specific Question:   Preferred imaging location?    Answer:   [facility]    Order Specific Question:   Radiology Contrast Protocol - do NOT remove file path    Answer:   [email or web address]  . CBC with Differential/Platelet    Standing Status:   Future    Standing Expiration Date:   01/03/2021  . Comprehensive metabolic panel    Standing Status:   Future    Standing Expiration Date:   01/03/2021  . Lactate dehydrogenase    Standing Status:   Future    Standing Expiration Date:   01/03/2021  . Hold Tube- Blood Bank    Standing Status:   Future    Standing Expiration Date:   01/03/2021   All questions were answered. The patient knows to call the clinic with any problems, questions or concerns.      Cammie Sickle, MD 01/04/2020 11:08 AM

## 2020-01-05 ENCOUNTER — Ambulatory Visit: Payer: Medicare Other | Admitting: Dermatology

## 2020-01-10 ENCOUNTER — Other Ambulatory Visit: Payer: Self-pay | Admitting: *Deleted

## 2020-01-10 DIAGNOSIS — C8300 Small cell B-cell lymphoma, unspecified site: Secondary | ICD-10-CM

## 2020-01-10 DIAGNOSIS — C9112 Chronic lymphocytic leukemia of B-cell type in relapse: Secondary | ICD-10-CM

## 2020-01-10 MED ORDER — OXYCODONE-ACETAMINOPHEN 5-325 MG PO TABS: 1.0000 | ORAL_TABLET | Freq: Four times a day (QID) | ORAL | 0 refills | Status: DC | PRN

## 2020-01-10 NOTE — Telephone Encounter (Signed)
Patient requests refill for pended order of Percocet.

## 2020-01-17 ENCOUNTER — Other Ambulatory Visit: Payer: Self-pay | Admitting: *Deleted

## 2020-01-17 MED ORDER — MORPHINE SULFATE ER 15 MG PO TBCR
15.0000 mg | EXTENDED_RELEASE_TABLET | Freq: Two times a day (BID) | ORAL | 0 refills | Status: DC
Start: 2020-01-17 — End: 2020-01-24

## 2020-01-18 ENCOUNTER — Other Ambulatory Visit: Payer: Self-pay | Admitting: Neurosurgery

## 2020-01-19 ENCOUNTER — Other Ambulatory Visit: Payer: Self-pay | Admitting: Neurosurgery

## 2020-01-19 DIAGNOSIS — M4807 Spinal stenosis, lumbosacral region: Secondary | ICD-10-CM

## 2020-01-21 ENCOUNTER — Telehealth: Payer: Self-pay | Admitting: *Deleted

## 2020-01-21 NOTE — Telephone Encounter (Signed)
Please advise 

## 2020-01-21 NOTE — Telephone Encounter (Signed)
Patient called reporting that he is having severe pain in his stomach and is asking if he can have stronger pain medicine for it. Please advise

## 2020-01-24 ENCOUNTER — Inpatient Hospital Stay (HOSPITAL_BASED_OUTPATIENT_CLINIC_OR_DEPARTMENT_OTHER): Payer: Medicare Other | Admitting: Oncology

## 2020-01-24 ENCOUNTER — Inpatient Hospital Stay: Payer: Medicare Other

## 2020-01-24 ENCOUNTER — Other Ambulatory Visit: Payer: Self-pay

## 2020-01-24 ENCOUNTER — Ambulatory Visit
Admission: RE | Admit: 2020-01-24 | Discharge: 2020-01-24 | Disposition: A | Discharge status: Home / Self Care | Payer: Medicare Other | Source: Ambulatory Visit | Attending: Internal Medicine | Admitting: Internal Medicine

## 2020-01-24 VITALS — BP 133/71 | HR 96 | Temp 99.0°F | Resp 18 | Wt 165.0 lb

## 2020-01-24 DIAGNOSIS — C9110 Chronic lymphocytic leukemia of B-cell type not having achieved remission: Secondary | ICD-10-CM

## 2020-01-24 DIAGNOSIS — N184 Chronic kidney disease, stage 4 (severe): Secondary | ICD-10-CM

## 2020-01-24 DIAGNOSIS — I70219 Atherosclerosis of native arteries of extremities with intermittent claudication, unspecified extremity: Secondary | ICD-10-CM

## 2020-01-24 DIAGNOSIS — E875 Hyperkalemia: Secondary | ICD-10-CM

## 2020-01-24 DIAGNOSIS — N183 Chronic kidney disease, stage 3 unspecified: Secondary | ICD-10-CM | POA: Diagnosis not present

## 2020-01-24 DIAGNOSIS — R1013 Epigastric pain: Secondary | ICD-10-CM

## 2020-01-24 DIAGNOSIS — C9192 Lymphoid leukemia, unspecified, in relapse: Secondary | ICD-10-CM | POA: Diagnosis not present

## 2020-01-24 DIAGNOSIS — C9112 Chronic lymphocytic leukemia of B-cell type in relapse: Secondary | ICD-10-CM

## 2020-01-24 LAB — CBC WITH DIFFERENTIAL/PLATELET
Abs Immature Granulocytes: 0.01 10*3/uL (ref 0.00–0.07)
Basophils Absolute: 0.1 10*3/uL (ref 0.0–0.1)
Basophils Relative: 2 %
Eosinophils Absolute: 0.1 10*3/uL (ref 0.0–0.5)
Eosinophils Relative: 1 %
HCT: 31.5 % — ABNORMAL LOW (ref 39.0–52.0)
Hemoglobin: 10.1 g/dL — ABNORMAL LOW (ref 13.0–17.0)
Immature Granulocytes: 0 %
Lymphocytes Relative: 32 %
Lymphs Abs: 1.8 10*3/uL (ref 0.7–4.0)
MCH: 33.9 pg (ref 26.0–34.0)
MCHC: 32.1 g/dL (ref 30.0–36.0)
MCV: 105.7 fL — ABNORMAL HIGH (ref 80.0–100.0)
Monocytes Absolute: 1.1 10*3/uL — ABNORMAL HIGH (ref 0.1–1.0)
Monocytes Relative: 19 %
Neutro Abs: 2.6 10*3/uL (ref 1.7–7.7)
Neutrophils Relative %: 46 %
Platelets: 180 10*3/uL (ref 150–400)
RBC: 2.98 MIL/uL — ABNORMAL LOW (ref 4.22–5.81)
RDW: 15.9 % — ABNORMAL HIGH (ref 11.5–15.5)
WBC: 5.7 10*3/uL (ref 4.0–10.5)
nRBC: 0 % (ref 0.0–0.2)

## 2020-01-24 LAB — BASIC METABOLIC PANEL
Anion gap: 10 (ref 5–15)
BUN: 37 mg/dL — ABNORMAL HIGH (ref 8–23)
CO2: 26 mmol/L (ref 22–32)
Calcium: 9.2 mg/dL (ref 8.9–10.3)
Chloride: 102 mmol/L (ref 98–111)
Creatinine, Ser: 1.47 mg/dL — ABNORMAL HIGH (ref 0.61–1.24)
GFR calc Af Amer: 55 mL/min — ABNORMAL LOW (ref >60)
GFR calc non Af Amer: 48 mL/min — ABNORMAL LOW (ref >60)
Glucose, Bld: 160 mg/dL — ABNORMAL HIGH (ref 70–99)
Potassium: 6.2 mmol/L — ABNORMAL HIGH (ref 3.5–5.1)
Sodium: 138 mmol/L (ref 135–145)

## 2020-01-24 MED ORDER — MORPHINE SULFATE ER 15 MG PO TBCR
15.0000 mg | EXTENDED_RELEASE_TABLET | Freq: Three times a day (TID) | ORAL | 0 refills | Status: DC
Start: 2020-01-24 — End: 2020-02-01

## 2020-01-24 MED ORDER — IOHEXOL 300 MG/ML  SOLN
100.0000 mL | Freq: Once | INTRAMUSCULAR | Status: AC | PRN
  Administered 2020-01-24: 100 mL via INTRAVENOUS

## 2020-01-24 MED ORDER — OXYCODONE HCL 5 MG PO CAPS
10.0000 mg | ORAL_CAPSULE | ORAL | 0 refills | Status: DC | PRN
Start: 2020-01-24 — End: 2020-02-15

## 2020-01-24 NOTE — Progress Notes (Signed)
Chronic hurting in abdomen. Mid line just below sternum. Takes laxatives having solid stools. Last BM was formed on Saturday. No vomiting. Drinks Boost and lots of liquids. Has CT scan ordered for tomorrow.

## 2020-01-24 NOTE — Telephone Encounter (Signed)
He has a CT abdomen scheduled for tomorrow- should we move it to today? I can probably take care of that this morning.   Faythe Casa, NP 01/24/2020 9:37 AM

## 2020-01-24 NOTE — Telephone Encounter (Signed)
Dr. Darletta Moll - please advise

## 2020-01-24 NOTE — Patient Instructions (Signed)
I am so sorry that you are not feeling well.  We will adjust your narcotics.  I have sent a new prescription and for your long-acting pain medicine your MS Contin 15 mg tablets you can now take 3 times a day which is every 8 hours.  You were previously taking this twice a day which is every 12 hours.  You may increase this starting today to every 8 hours.  I have also sent a new prescription and for a short acting pain medicine.  I have increased the dose from 5 mg to 10 mg and gotten rid of the Tylenol because you are taking too much of the other pain medicine.  This medication you can take every 3-4 hours as needed for breakthrough pain.   We will move up your CT scan to today and see if there is anything acute that is going on in your abdomen given the worsening pain.  I have moved your scan to the outpatient imaging center which is off of [facility].  We will call you with the results.  Faythe Casa, NP 01/24/2020 12:39 PM

## 2020-01-24 NOTE — Telephone Encounter (Signed)
Called and spoke to patient. Pain is 10/10-mid abdomen- unable to eat but able to tolerate liquids- no nausea or vomting- last BM Saturday. Taking norco every 2 hours without relief (prescribed q6 hours). Has a scheduled CT abd scheduled for tomorrow. Will get imaging moved up and adjust pain medicines.   Faythe Casa, NP 01/24/2020 11:07 AM

## 2020-01-24 NOTE — Progress Notes (Addendum)
Symptom Management Consult note [facility]  [phone] [phone]  Patient Care Team: [name], MD as PCP - General (Internal Medicine) Cammie Sickle, MD as Consulting Physician (Internal Medicine)   Name of the patient: James [name]  [id number]  [birth year]   Date of visit: 01/24/2020   Diagnosis- CLL  Chief complaint/ Reason for visit- abdominal pain  Heme/Onc history:  Oncology History Overview Note  # 2006- CLL STAGE IV; MAY 2011- WBC- 57K;Platelets-99;Hb-12/CT Bulky LN; BMBx- 80% Invol; del 11; START Benda-Ritux x4 [finished Sep 2011];   # July 2015-Progression; Sep 2015-START ibrutinib; CT scan DEC 2015- Improvement LN; Cont Ibrutinib 137m/d; NOV 2016 CT- 1-2CM LN [mild progression compared to Dec 2015];NOV 2016- FISH peripheral blood- NO MUTATIONS/CD-38 Positive; NOV 7th- CONT IBRUTINIB 2 pills/day; CT AUG 2017- STABLE;  DEC 6th PET- Mild RP LN/ Retrocrural LN  # OFF ibrutinib [? intol]- sep 2019- Jan 16th 2020; Re-start Ibrutinib; September 2020-stop ibrutinib [poor tolerance/worsening anemia]  # Jan 16th 2020- start aranesp; HOLD while on [place]   # MARCH 11th 2021-James [name]  # DEC 2019- PAIN CONTRACT  # PALLIATIVE CARE- 06/21/2019-  # October 2019-bone marrow biopsy [worsening anemia]-question dyserythropoietic changes/small clone of CLL;  # [facility] HEM- NEG.  SA skin infection [Oct 2016] s/p clinda -------------------------------------------------------    DIAGNOSIS: CLL  STAGE:   IV      ;GOALS: palliative  CURRENT/MOST RECENT THERAPY : Gazyva [C]   C[place] lymphocytic [place]) [place]  07/08/2019 -  Chemotherapy   The patient had obinutuzumab (GAZYVA) 100 mg in sodium chloride 0.9 % 100 mL (0.9615 mg/mL) chemo infusion, 100 mg, Intravenous, Once, 6 of 6 cycles Administration: 100 mg (07/08/2019), 900 mg (07/09/2019), 1,000 mg (07/26/2019), 1,000 mg (08/16/2019), 1,000 mg (08/02/2019),  1,000 mg (09/13/2019), 1,000 mg (10/11/2019), 1,000 mg (11/09/2019), 1,000 mg (12/07/2019)  for chemotherapy treatment.     Interval history-James [name] a 69year old male with past medical history significant for hypertension, CAD, GERD, type 2 diabetes, CKD stage III, DDD, tobacco abuse, vitamin D deficiency, CLL who is followed by Dr. BRogue Bussingstatus post initial treatment which was completed back in September 2011.  Progression noted in 2015-started ibrutinib; discontinued secondary to intolerance; worsening anemia.  [place]ed on [place]  He has been seen on several occasions for abdominal pain.  Has had extensive work-up without any known cause. Previous imaging from March 2021 did show progressive lymphadenopathy in the chest and neck but also in the abdomen; retroperitoneal/mesenteric/left iliac hypermetabolic lymph nodes, splenomegaly bilateral kidney stones and renal cyst and an enlarged prostate.  He is currently being managed on MS Contin 15 mg twice daily along with Percocet 5/325 mg every 6 hours as needed for breakthrough pain.  He is on a pain contract.  He has also been evaluated by Dr. NAlver Fisherat the pain clinic and is expected to get a bilateral celiac plexus block in October 2021.  [name] of worsening abdominal pain over the past week.  States ever since he received his new prescription for pain medicine the pain has been 7/10 fairly constant.  He is currently taking MS Contin 15 mg every 12 hours along with Percocet 5-325 mg tablets every 2-3 hours.  Pain is epigastric generalized. Has very little appetite. Able to drink fluids. Occasional nausea.  Overall feels poor.  Denies fever or illness.  Reports constipation.  Last BM was on Saturday.  Takes his  MiraLAX and Senokot occasionally.  ECOG FS:1 - Symptomatic but completely ambulatory  Review of systems- Review of Systems  Constitutional: Negative.  Negative for chills, fever, malaise/fatigue and  weight loss.  HENT: Negative for congestion, ear pain and tinnitus.   Eyes: Negative.  Negative for blurred vision and double vision.  Respiratory: Negative.  Negative for cough, sputum production and shortness of breath.   Cardiovascular: Negative.  Negative for chest pain, palpitations and leg swelling.  Gastrointestinal: Positive for abdominal pain. Negative for constipation, diarrhea, nausea and vomiting.  Genitourinary: Negative for dysuria, frequency and urgency.  Musculoskeletal: Negative for back pain and falls.  Skin: Negative.  Negative for rash.  Neurological: Negative.  Negative for weakness and headaches.  Endo/Heme/Allergies: Negative.  Does not bruise/bleed easily.  Psychiatric/Behavioral: Negative.  Negative for depression. The patient is not nervous/anxious and does not have insomnia.      Current treatment-Gazyva  No Known Allergies   Past Medical History:  Diagnosis Date   CLL (chronic lymphocytic leukemia) ([place])    Constipation    Depression    Diabetes mellitus without complication ([place])    Hematuria    Hyperlipidemia    Hypertension    Therapeutic opioid induced constipation      Past Surgical History:  Procedure Laterality Date   CATARACT EXTRACTION W/ INTRAOCULAR LENS IMPLANT Bilateral    CHOLECYSTECTOMY  1983   COLONOSCOPY WITH PROPOFOL N/A 10/27/2017   Procedure: COLONOSCOPY WITH PROPOFOL;  Surgeon: Manya Silvas, MD;  Location: [facility] ENDOSCOPY;  Service: Endoscopy;  Laterality: N/A;   ESOPHAGOGASTRODUODENOSCOPY (EGD) WITH PROPOFOL N/A 11/20/2016   Procedure: ESOPHAGOGASTRODUODENOSCOPY (EGD) WITH PROPOFOL;  Surgeon: Manya Silvas, MD;  Location: [facility] ENDOSCOPY;  Service: Endoscopy;  Laterality: N/A;   ESOPHAGOGASTRODUODENOSCOPY (EGD) WITH PROPOFOL N/A 10/27/2017   Procedure: ESOPHAGOGASTRODUODENOSCOPY (EGD) WITH PROPOFOL;  Surgeon: Manya Silvas, MD;  Location: [facility] ENDOSCOPY;  Service: Endoscopy;  Laterality: N/A;   LOWER  EXTREMITY ANGIOGRAPHY Right 05/19/2017   Procedure: LOWER EXTREMITY ANGIOGRAPHY;  Surgeon: Algernon Huxley, MD;  Location: [place] CV LAB;  Service: Cardiovascular;  Laterality: Right;   LOWER EXTREMITY ANGIOGRAPHY Left 08/10/2018   Procedure: LOWER EXTREMITY ANGIOGRAPHY;  Surgeon: Algernon Huxley, MD;  Location: [place] CV LAB;  Service: Cardiovascular;  Laterality: Left;    Social History   Socioeconomic History   Marital status: Married    Spouse name: [name]   Number of children: 1   Years of education: Not on file   Highest education level: Not on file  Occupational History   Occupation: retired    Comment: truck driver  Tobacco Use   Smoking status: Current Every Day Smoker    Packs/day: 0.50    Years: 47.00    Pack years: 23.50    Types: Cigarettes   Smokeless tobacco: Never Used  Scientific laboratory technician Use: Never used  Substance and Sexual Activity   Alcohol use: Yes    Alcohol/week: 1.0 standard drink    Types: 1 Glasses of wine per week    Comment:  almost none in last 6 months   Drug use: No   Sexual activity: Never    Partners: Female  Other Topics Concern   Not on file  Social History Narrative   Not on file   Social Determinants of Health   Financial Resource Strain:    Difficulty of Paying Living Expenses: Not on file  Food Insecurity:    Worried About Charity fundraiser in the  Last Year: Not on file   Ran Out of Food in the Last Year: Not on file  Transportation Needs:    Lack of Transportation (Medical): Not on file   Lack of Transportation (Non-Medical): Not on file  Physical Activity:    Days of Exercise per Week: Not on file   Minutes of Exercise per Session: Not on file  Stress:    Feeling of Stress : Not on file  Social Connections:    Frequency of Communication with Friends and Family: Not on file   Frequency of Social Gatherings with Friends and Family: Not on file   Attends Religious Services: Not on file     Active Member of Clubs or Organizations: Not on file   Attends Archivist Meetings: Not on file   Marital Status: Not on file  Intimate Partner Violence:    Fear of Current or Ex-Partner: Not on file   Emotionally Abused: Not on file   Physically Abused: Not on file   Sexually Abused: Not on file    Family History  Problem Relation Age of Onset   Hypertension Sister      Current Outpatient Medications:    aspirin-sod bicarb-citric acid (ALKA-SELTZER) 325 MG TBEF tablet, Take 325 mg by mouth every 6 (six) hours as needed (acid reflux)., Disp: , Rfl:    atorvastatin (LIPITOR) 10 MG tablet, Take 1 tablet (10 mg total) by mouth daily., Disp: 30 tablet, Rfl: 11   Cholecalciferol (VITAMIN D3) 125 MCG (5000 UT) CAPS, Take 1 capsule (5,000 Units total) by mouth daily with breakfast. Take along with calcium and magnesium., Disp: 90 capsule, Rfl: 1   clopidogrel (PLAVIX) 75 MG tablet, Take 1 tablet by mouth once daily (Patient taking differently: Take 75 mg by mouth daily. ), Disp: 90 tablet, Rfl: 0   Ensure (ENSURE), Take 237 mLs by mouth 3 (three) times daily between meals. , Disp: , Rfl:    folic acid (FOLVITE) 1 MG tablet, Take 1 tablet (1 mg total) by mouth daily., Disp: 90 tablet, Rfl: 1   metFORMIN (GLUCOPHAGE) 1000 MG tablet, Take 1,000 mg by mouth daily with breakfast. , Disp: , Rfl:    morphine (MS CONTIN) 15 MG 12 hr tablet, Take 1 tablet (15 mg total) by mouth every 8 (eight) hours., Disp: 90 tablet, Rfl: 0   naloxegol oxalate (MOVANTIK) 25 MG TABS tablet, Take 1 tablet (25 mg total) by mouth daily., Disp: 30 tablet, Rfl: 0   oxyCODONE-acetaminophen (PERCOCET/ROXICET) 5-325 MG tablet, Take 1 tablet by mouth every 6 (six) hours as needed for severe pain., Disp: 90 tablet, Rfl: 0   pantoprazole (PROTONIX) 40 MG tablet, Take 40 mg by mouth at bedtime., Disp: , Rfl:    sucralfate (CARAFATE) 1 g tablet, Take 1 g by mouth 4 (four) times daily -  with meals and  at bedtime., Disp: , Rfl:    tamsulosin (FLOMAX) 0.4 MG CAPS capsule, Take 0.4 mg by mouth daily., Disp: , Rfl:    acyclovir (ZOVIRAX) 400 MG tablet, One pill a day [to prevent shingles] (Patient not taking: Reported on 01/21/2020), Disp: 30 tablet, Rfl: 3   aspirin EC 81 MG tablet, Take 81 mg by mouth daily. (Patient not taking: Reported on 01/21/2020), Disp: , Rfl:    Calcium Carb-Cholecalciferol (CALCIUM PLUS D3 ABSORBABLE) [phone] MG-UNIT CAPS, Take 1 capsule by mouth 2 (two) times daily with a meal., Disp: 60 capsule, Rfl: 5   dexamethasone (DECADRON) 4 MG tablet, Start 2 days  prior to infusion; Take for 2 days. Do not take on the day of infusion. (Patient not taking: Reported on 01/21/2020), Disp: 60 tablet, Rfl: 3   iron polysaccharides (NU-IRON) 150 MG capsule, Take 1 capsule (150 mg total) by mouth daily. (Patient not taking: Reported on 01/21/2020), Disp: 30 capsule, Rfl: 1   montelukast (SINGULAIR) 10 MG tablet, Take 1 tablet (10 mg total) by mouth at bedtime. Start 2 days prior to infusion. Take it for 4 days. (Patient not taking: Reported on 01/21/2020), Disp: 60 tablet, Rfl: 0   oxycodone (OXY-IR) 5 MG capsule, Take 2 capsules (10 mg total) by mouth every 4 (four) hours as needed., Disp: 90 capsule, Rfl: 0  Physical exam:  Vitals:   01/24/20 1221  BP: 133/71  Pulse: 96  Resp: 18  Temp: 99 F (37.2 C)  TempSrc: Tympanic  Weight: 165 lb (74.8 kg)   Physical Exam Constitutional:      Appearance: Normal appearance.  HENT:     Head: Normocephalic and atraumatic.  Eyes:     Pupils: Pupils are equal, round, and reactive to light.  Cardiovascular:     Rate and Rhythm: Normal rate and regular rhythm.     Heart sounds: Normal heart sounds. No murmur heard.   Pulmonary:     Effort: Pulmonary effort is normal.     Breath sounds: Normal breath sounds. No wheezing.  Abdominal:     General: Bowel sounds are normal. There is no distension.     Palpations: Abdomen is soft.      Tenderness: There is abdominal tenderness in the epigastric area.  Musculoskeletal:        General: Normal range of motion.     Cervical back: Normal range of motion.  Skin:    General: Skin is warm and dry.     Findings: No rash.  Neurological:     Mental Status: He is alert and oriented to person, place, and time.  Psychiatric:        Judgment: Judgment normal.      CMP Latest Ref Rng & Units 01/24/2020  Glucose 70 - 99 mg/dL 160(H)  BUN 8 - 23 mg/dL 37(H)  Creatinine 0.61 - 1.24 mg/dL 1.47(H)  Sodium 135 - 145 mmol/L 138  Potassium 3.5 - 5.1 mmol/L 6.2(H)  Chloride 98 - 111 mmol/L 102  CO2 22 - 32 mmol/L 26  Calcium 8.9 - 10.3 mg/dL 9.2  Total Protein 6.5 - 8.1 g/dL -  Total Bilirubin 0.3 - 1.2 mg/dL -  Alkaline Phos 38 - 126 U/L -  AST 15 - 41 U/L -  ALT 0 - 44 U/L -   CBC Latest Ref Rng & Units 01/24/2020  WBC 4.0 - 10.5 K/uL 5.7  Hemoglobin 13.0 - 17.0 g/dL 10.1(L)  Hematocrit 39 - 52 % 31.5(L)  Platelets 150 - 400 K/uL 180    No images are attached to the encounter.  No results found.   Assessment and plan- Patient is a 69 y.o. male who is seen for evaluation of acute on chronic abdominal pain.  Symptoms have been present for quite some time with unknown etiology.  He has had extensive work-up without any known cause.  Patient has known CLL stage IV currently on Gazyva for treatment.  Previous imaging from March 2021 did show retroperitoneal/mesenteric/left iliac hypermetabolic lymph nodes.  He is currently being managed on MS Contin 15 mg twice daily along with Percocet 5/325 mg every 6 hours as needed for breakthrough pain.  He is on a pain contract.  He has also been evaluated by Dr. Alver Fisher at the pain clinic and is expected to get a bilateral celiac plexus block in October 2021.  Acute on chronic abdominal pain-appears to be worse over the past 1 to 2 weeks per patient.  Currently taking Percocet 5-325 mg tablets every 2-3 hours.  He is also taking his MS Contin  15 mg twice daily as instructed per Altha Harm, NP.  Patient notes he has been taking his short acting medication too often but nothing appears to be relieving the pain.   Constipation-not consistently taking his stool softeners.  Recommend MiraLAX and Senokot daily to prevent constipation or worsening abdominal symptoms.  Hyperkalemia- K 6.2. on Lokelma per Dr. Holley Raring.  Patient states he takes this intermittently when his potassium levels are high.  He is currently not taking.  I recommended he restart and have lab work rechecked at the end of this week.  He is also instructed to drink plenty of fluids.  He declined IV fluids.  CKD stage III-Elevated today.  Creatinine 1.4.  This appears to be baseline for him.  I have encouraged him to drink plenty of fluids.  Offered IV fluids but he declined. Will recheck labs later this week.   Plan- Move up CT abdomen/pelvis to today.  He was scheduled to have a CT scan done tomorrow. Adjust narcotics-increase MS Contin from twice daily to 3 times per day.  Stop Percocet.  Patient taking too much Tylenol.  Start oxycodone 10 mg every 4 hours as needed for pain.  Take MiraLAX/Senokot daily. Restart Lokelma for hyperkalemia.  Disposition- CT abdomen/pelvis today. Recheck labs on Thursday.  Keep scheduled appointments. Return to clinic as scheduled to see Dr. Jacinto Reap.   Visit Diagnosis 1. CLL (chronic lymphoid leukemia) in relapse ([facility])   2. Epigastric pain     Patient expressed understanding and was in agreement with this plan. He also understands that He can call clinic at any time with any questions, concerns, or complaints.   Greater than 50% was spent in counseling and coordination of care with this patient including but not limited to discussion of the relevant topics above (See A&P) including, but not limited to diagnosis and management of acute and chronic medical conditions.   Thank you for allowing me to participate in the care of this very  pleasant patient.    Jacquelin Hawking, NP [place] [facility] Cell - [phone] Pager- [phone] 01/24/2020 12:59 PM   Dr. Rogue Bussing

## 2020-01-25 ENCOUNTER — Ambulatory Visit: Admission: RE | Admit: 2020-01-25 | Payer: Medicare Other | Source: Ambulatory Visit

## 2020-01-27 ENCOUNTER — Inpatient Hospital Stay: Payer: Medicare Other

## 2020-01-31 ENCOUNTER — Encounter: Payer: Self-pay | Admitting: Internal Medicine

## 2020-01-31 NOTE — Progress Notes (Signed)
Patient called for pre assessment. He reports constant pain in his abdomen and rates pain at 6. He states that pain medication that he recently was prescribed is not working and would like to discuss with provider. Name medication he would like to discuss is oxycodone 5 mg.

## 2020-02-01 ENCOUNTER — Inpatient Hospital Stay (HOSPITAL_BASED_OUTPATIENT_CLINIC_OR_DEPARTMENT_OTHER): Payer: Medicare Other | Admitting: Internal Medicine

## 2020-02-01 ENCOUNTER — Inpatient Hospital Stay: Payer: Medicare Other

## 2020-02-01 ENCOUNTER — Other Ambulatory Visit: Payer: Self-pay

## 2020-02-01 ENCOUNTER — Inpatient Hospital Stay: Payer: Medicare Other | Attending: Internal Medicine

## 2020-02-01 DIAGNOSIS — N183 Chronic kidney disease, stage 3 unspecified: Secondary | ICD-10-CM | POA: Diagnosis present

## 2020-02-01 DIAGNOSIS — D631 Anemia in chronic kidney disease: Secondary | ICD-10-CM | POA: Diagnosis not present

## 2020-02-01 DIAGNOSIS — I70219 Atherosclerosis of native arteries of extremities with intermittent claudication, unspecified extremity: Secondary | ICD-10-CM | POA: Diagnosis not present

## 2020-02-01 DIAGNOSIS — C9110 Chronic lymphocytic leukemia of B-cell type not having achieved remission: Secondary | ICD-10-CM

## 2020-02-01 LAB — CBC WITH DIFFERENTIAL/PLATELET
Abs Immature Granulocytes: 0.01 10*3/uL (ref 0.00–0.07)
Basophils Absolute: 0.1 10*3/uL (ref 0.0–0.1)
Basophils Relative: 2 %
Eosinophils Absolute: 0.1 10*3/uL (ref 0.0–0.5)
Eosinophils Relative: 3 %
HCT: 30.4 % — ABNORMAL LOW (ref 39.0–52.0)
Hemoglobin: 9.6 g/dL — ABNORMAL LOW (ref 13.0–17.0)
Immature Granulocytes: 0 %
Lymphocytes Relative: 42 %
Lymphs Abs: 2.4 10*3/uL (ref 0.7–4.0)
MCH: 33.7 pg (ref 26.0–34.0)
MCHC: 31.6 g/dL (ref 30.0–36.0)
MCV: 106.7 fL — ABNORMAL HIGH (ref 80.0–100.0)
Monocytes Absolute: 0.6 10*3/uL (ref 0.1–1.0)
Monocytes Relative: 10 %
Neutro Abs: 2.4 10*3/uL (ref 1.7–7.7)
Neutrophils Relative %: 43 %
Platelets: 167 10*3/uL (ref 150–400)
RBC: 2.85 MIL/uL — ABNORMAL LOW (ref 4.22–5.81)
RDW: 15.5 % (ref 11.5–15.5)
WBC: 5.7 10*3/uL (ref 4.0–10.5)
nRBC: 0 % (ref 0.0–0.2)

## 2020-02-01 LAB — COMPREHENSIVE METABOLIC PANEL
ALT: 15 U/L (ref 0–44)
AST: 14 U/L — ABNORMAL LOW (ref 15–41)
Albumin: 4.7 g/dL (ref 3.5–5.0)
Alkaline Phosphatase: 58 U/L (ref 38–126)
Anion gap: 10 (ref 5–15)
BUN: 23 mg/dL (ref 8–23)
CO2: 28 mmol/L (ref 22–32)
Calcium: 9.4 mg/dL (ref 8.9–10.3)
Chloride: 99 mmol/L (ref 98–111)
Creatinine, Ser: 1.24 mg/dL (ref 0.61–1.24)
GFR calc non Af Amer: 59 mL/min — ABNORMAL LOW (ref >60)
Glucose, Bld: 145 mg/dL — ABNORMAL HIGH (ref 70–99)
Potassium: 5.5 mmol/L — ABNORMAL HIGH (ref 3.5–5.1)
Sodium: 137 mmol/L (ref 135–145)
Total Bilirubin: 0.6 mg/dL (ref 0.3–1.2)
Total Protein: 7.3 g/dL (ref 6.5–8.1)

## 2020-02-01 LAB — SAMPLE TO BLOOD BANK

## 2020-02-01 LAB — LACTATE DEHYDROGENASE: LDH: 108 U/L (ref 98–192)

## 2020-02-01 MED ORDER — DARBEPOETIN ALFA 300 MCG/0.6ML IJ SOSY
300.0000 ug | PREFILLED_SYRINGE | Freq: Once | INTRAMUSCULAR | Status: AC
  Administered 2020-02-01: 300 ug via SUBCUTANEOUS
  Filled 2020-02-01: qty 0.6

## 2020-02-01 MED ORDER — MORPHINE SULFATE ER 30 MG PO TBCR: 30.0000 mg | EXTENDED_RELEASE_TABLET | Freq: Two times a day (BID) | ORAL | 0 refills | Status: DC

## 2020-02-01 NOTE — Progress Notes (Signed)
Should there cone James OFFICE PROGRESS NOTE  Patient Care Team: [name], MD as PCP - General (Internal Medicine) Cammie Sickle, MD as Consulting Physician (Internal Medicine)  Cancer Staging No matching staging information was found for the patient.   Oncology History Overview Note  # 2006- CLL STAGE IV; MAY 2011- WBC- 57K;Platelets-99;Hb-12/CT Bulky LN; BMBx- 80% Invol; del 11; START Benda-Ritux x4 [finished Sep 2011];   # July 2015-Progression; Sep 2015-START ibrutinib; CT scan DEC 2015- Improvement LN; Cont Ibrutinib 114m/d; NOV 2016 CT- 1-2CM LN [mild progression compared to Dec 2015];NOV 2016- FISH peripheral blood- NO MUTATIONS/CD-38 Positive; NOV 7th- CONT IBRUTINIB 2 pills/day; CT AUG 2017- STABLE;  DEC 6th PET- Mild RP LN/ Retrocrural LN  # OFF ibrutinib [? intol]- sep 2019- Jan 16th 2020; Re-start Ibrutinib; September 2020-stop ibrutinib [poor tolerance/worsening anemia]  # Jan 16th 2020- start aranesp; HOLD while on [place]   # MARCH 11th 2021-James [name]  # DEC 2019- PAIN CONTRACT  # PALLIATIVE CARE- 06/21/2019-  # October 2019-bone marrow biopsy [worsening anemia]-question dyserythropoietic changes/small clone of CLL;  # [facility] HEM- NEG.  SA skin infection [Oct 2016] s/p clinda -------------------------------------------------------    DIAGNOSIS: CLL  STAGE:   IV      ;GOALS: palliative  CURRENT/MOST RECENT THERAPY : Gazyva [C]   CLL (chronic lymphocytic leukemia) ([place]  07/08/2019 -  Chemotherapy   The patient had obinutuzumab (GAZYVA) 100 mg in sodium chloride 0.9 % 100 mL (0.9615 mg/mL) chemo infusion, 100 mg, Intravenous, Once, 6 of 6 cycles Administration: 100 mg (07/08/2019), 900 mg (07/09/2019), 1,000 mg (07/26/2019), 1,000 mg (08/16/2019), 1,000 mg (08/02/2019), 1,000 mg (09/13/2019), 1,000 mg (10/11/2019), 1,000 mg (11/09/2019), 1,000 mg (12/07/2019)  for chemotherapy treatment.      INTERVAL HISTORY:  FKITO [name] 69y.o.  male pleasant patient above progressive CLL and chronic abdominal pain of unclear etiology; anemia- ckd/hyperkalemia currently GDyann Kiefis here for follow-up/review results of the CT scan  Patient interim was evaluated by urology-for difficulty with urination.  [facility] for his chronic abdominal pain.  Patient was seen in the symptom management clinic last weeks-increase MS Contin 15 mg to 3 times a day; also given oxycodone 5 mg 2 pills every 4 hours as needed.  Patient continues to complain of abdominal pain not improved.  Poor appetite.  Positive weight loss.   Review of Systems  Constitutional: Positive for malaise/fatigue. Negative for chills, diaphoresis and fever.  HENT: Negative for nosebleeds and sore throat.   Eyes: Negative for double vision.  Respiratory: Positive for shortness of breath. Negative for cough, hemoptysis, sputum production and wheezing.   Cardiovascular: Negative for chest pain, palpitations, orthopnea and leg swelling.  Gastrointestinal: Positive for abdominal pain, constipation and nausea. Negative for blood in stool, diarrhea, heartburn, melena and vomiting.  Genitourinary: Negative for dysuria, frequency and urgency.  Skin: Negative.  Negative for itching and rash.  Neurological: Negative for dizziness, tingling, focal weakness, weakness and headaches.  Endo/Heme/Allergies: Does not bruise/bleed easily.  Psychiatric/Behavioral: Negative for depression. The patient is not nervous/anxious and does not have insomnia.       PAST MEDICAL HISTORY :  Past Medical History:  Diagnosis Date  . CLL ([place]ronic lymphocytic leukemia) ([place]   . Constipation   . Depression   . Diabetes mellitus without complication ([place]   . Hematuria   . Hyperlipidemia   . Hypertension   . Therapeutic opioid induced  constipation     PAST SURGICAL HISTORY :   Past Surgical History:  Procedure Laterality Date   . CATARACT EXTRACTION W/ INTRAOCULAR LENS IMPLANT Bilateral   . CHOLECYSTECTOMY  1983  . COLONOSCOPY WITH PROPOFOL N/A 10/27/2017   Procedure: COLONOSCOPY WITH PROPOFOL;  Surgeon: Manya Silvas, MD;  Location[facility] ENDOSCOPY;  Service: Endoscopy;  Laterality: N/A;  . ESOPHAGOGASTRODUODENOSCOPY (EGD) WITH PROPOFOL N/A 11/20/2016   Procedure: ESOPHAGOGASTRODUODENOSCOPY (EGD) WITH PROPOFOL;  Surgeon: Manya Silvas, MD;  Location[facility] ENDOSCOPY;  Service: Endoscopy;  Laterality: N/A;  . ESOPHAGOGASTRODUODENOSCOPY (EGD) WITH PROPOFOL N/A 10/27/2017   Procedure: ESOPHAGOGASTRODUODENOSCOPY (EGD) WITH PROPOFOL;  Surgeon: Manya Silvas, MD;  Location: [facility] ENDOSCOPY;  Service: Endoscopy;  Laterality: N/A;  . LOWER EXTREMITY ANGIOGRAPHY Right 05/19/2017   Procedure: LOWER EXTREMITY ANGIOGRAPHY;  Surgeon: Algernon Huxley, MD;  Location: [place] CV LAB;  Service: Cardiovascular;  Laterality: Right;  . LOWER EXTREMITY ANGIOGRAPHY Left 08/10/2018   Procedure: LOWER EXTREMITY ANGIOGRAPHY;  Surgeon: Algernon Huxley, MD;  Location: [place] CV LAB;  Service: Cardiovascular;  Laterality: Left;    FAMILY HISTORY :   Family History  Problem Relation Age of Onset  . Hypertension Sister     SOCIAL HISTORY:   Social History   Tobacco Use  . Smoking status: Current Every Day Smoker    Packs/day: 0.50    Years: 47.00    Pack years: 23.50    Types: Cigarettes  . Smokeless tobacco: Never Used  Vaping Use  . Vaping Use: Never used  Substance Use Topics  . Alcohol use: Yes    Alcohol/week: 1.0 standard drink    Types: 1 Glasses of wine per week    Comment:  almost none in last 6 months  . Drug use: No    ALLERGIES:  has No Known Allergies.  MEDICATIONS:  Current Outpatient Medications  Medication Sig Dispense Refill  . aspirin-sod bicarb-citric acid (ALKA-SELTZER) 325 MG TBEF tablet Take 325 mg by mouth every 6 (six) hours as needed (acid reflux).    Marland Kitchen atorvastatin (LIPITOR) 10 MG  tablet Take 1 tablet (10 mg total) by mouth daily. 30 tablet 11  . Calcium Carb-Cholecalciferol (CALCIUM PLUS D3 ABSORBABLE) [id number] MG-UNIT CAPS Take 1 capsule by mouth 2 (two) times daily with a meal. 60 capsule 5  . Cholecalciferol (VITAMIN D3) 125 MCG (5000 UT) CAPS Take 1 capsule (5,000 Units total) by mouth daily with breakfast. Take along with calcium and magnesium. 90 capsule 1  . clopidogrel (PLAVIX) 75 MG tablet Take 1 tablet by mouth once daily (Patient taking differently: Take 75 mg by mouth daily. ) 90 tablet 0  . Ensure (ENSURE) Take 237 mLs by mouth 3 (three) times daily between meals.     . folic acid (FOLVITE) 1 MG tablet Take 1 tablet (1 mg total) by mouth daily. 90 tablet 1  . metFORMIN (GLUCOPHAGE) 1000 MG tablet Take 1,000 mg by mouth daily with breakfast.     . naloxegol oxalate (MOVANTIK) 25 MG TABS tablet Take 1 tablet (25 mg total) by mouth daily. 30 tablet 0  . oxycodone (OXY-IR) 5 MG capsule Take 2 capsules (10 mg total) by mouth every 4 (four) hours as needed. 90 capsule 0  . oxyCODONE-acetaminophen (PERCOCET/ROXICET) 5-325 MG tablet Take 1 tablet by mouth every 6 (six) hours as needed for severe pain. 90 tablet 0  . pantoprazole (PROTONIX) 40 MG tablet Take 40 mg by mouth at bedtime.    . sucralfate (CARAFATE)  1 g tablet Take 1 g by mouth 4 (four) times daily -  with meals and at bedtime.    . tamsulosin (FLOMAX) 0.4 MG CAPS capsule Take 0.4 mg by mouth daily.    Marland Kitchen acyclovir (ZOVIRAX) 400 MG tablet One pill a day [to prevent shingles] (Patient not taking: Reported on 01/21/2020) 30 tablet 3  . aspirin EC 81 MG tablet Take 81 mg by mouth daily. (Patient not taking: Reported on 01/21/2020)    . dexamethasone (DECADRON) 4 MG tablet Start 2 days prior to infusion; Take for 2 days. Do not take on the day of infusion. (Patient not taking: Reported on 01/31/2020) 60 tablet 3  . iron polysaccharides (NU-IRON) 150 MG capsule Take 1 capsule (150 mg total) by mouth daily. (Patient not  taking: Reported on 01/21/2020) 30 capsule 1  . montelukast (SINGULAIR) 10 MG tablet Take 1 tablet (10 mg total) by mouth at bedtime. Start 2 days prior to infusion. Take it for 4 days. (Patient not taking: Reported on 01/21/2020) 60 tablet 0  . morphine (MS CONTIN) 30 MG 12 hr tablet Take 1 tablet (30 mg total) by mouth every 12 (twelve) hours. 30 tablet 0   No current facility-administered medications for this visit.    PHYSICAL EXAMINATION: ECOG PERFORMANCE STATUS: 1 - Symptomatic but completely ambulatory  BP 122/76 (BP Location: Right Arm, Patient Position: Sitting, Cuff Size: Normal)   Pulse 88   Temp 98.1 James (36.7 C) (Tympanic)   Resp 18   Ht 5' 8"  (1.727 m)   Wt 163 lb (73.9 kg)   SpO2 100%   BMI 24.78 kg/m   Filed Weights   02/01/20 1003  Weight: 163 lb (73.9 kg)    Physical Exam Constitutional:      Comments: He is alone.  Appears pale.  HENT:     Head: Normocephalic and atraumatic.     Mouth/Throat:     Pharynx: No oropharyngeal exudate.  Eyes:     Pupils: Pupils are equal, round, and reactive to light.  Cardiovascular:     Rate and Rhythm: Normal rate and regular rhythm.  Pulmonary:     Effort: No respiratory distress.     Breath sounds: No wheezing.  Abdominal:     General: Bowel sounds are normal. There is no distension.     Palpations: Abdomen is soft. There is no mass.     Tenderness: There is no abdominal tenderness. There is no guarding or rebound.  Musculoskeletal:        General: No tenderness. Normal range of motion.     Cervical back: Normal range of motion and neck supple.  Skin:    General: Skin is warm.     Coloration: Skin is pale.     Comments: Multiple bruises noted in bilateral upper extremity.  Neurological:     Mental Status: He is alert and oriented to person, place, and time.  Psychiatric:        Mood and Affect: Affect normal.       LABORATORY DATA:  I have reviewed the data as listed    Component Value Date/Time   NA 137  02/01/2020 0940   NA 142 05/03/2014 1139   K 5.5 (H) 02/01/2020 0940   K 4.7 05/03/2014 1139   CL 99 02/01/2020 0940   CL 110 (H) 05/03/2014 1139   CO2 28 02/01/2020 0940   CO2 24 05/03/2014 1139   GLUCOSE 145 (H) 02/01/2020 0940   GLUCOSE 98 05/03/2014 1139  BUN 23 02/01/2020 0940   BUN 20 (H) 05/03/2014 1139   CREATININE 1.24 02/01/2020 0940   CREATININE 1.22 08/09/2014 1122   CALCIUM 9.4 02/01/2020 0940   CALCIUM 8.6 05/03/2014 1139   PROT 7.3 02/01/2020 0940   PROT 7.5 05/03/2014 1139   ALBUMIN 4.7 02/01/2020 0940   ALBUMIN 4.1 05/03/2014 1139   AST 14 (L) 02/01/2020 0940   AST 15 05/03/2014 1139   ALT 15 02/01/2020 0940   ALT 26 05/03/2014 1139   ALKPHOS 58 02/01/2020 0940   ALKPHOS 94 05/03/2014 1139   BILITOT 0.6 02/01/2020 0940   BILITOT 0.5 05/03/2014 1139   GFRNONAA 59 (L) 02/01/2020 0940   GFRNONAA >60 08/09/2014 1122   GFRAA 55 (L) 01/24/2020 1156   GFRAA >60 08/09/2014 1122    No results found for: SPEP, UPEP  Lab Results  Component Value Date   WBC 5.7 02/01/2020   NEUTROABS 2.4 02/01/2020   HGB 9.6 (L) 02/01/2020   HCT 30.4 (L) 02/01/2020   MCV 106.7 (H) 02/01/2020   PLT 167 02/01/2020      Chemistry      Component Value Date/Time   NA 137 02/01/2020 0940   NA 142 05/03/2014 1139   K 5.5 (H) 02/01/2020 0940   K 4.7 05/03/2014 1139   CL 99 02/01/2020 0940   CL 110 (H) 05/03/2014 1139   CO2 28 02/01/2020 0940   CO2 24 05/03/2014 1139   BUN 23 02/01/2020 0940   BUN 20 (H) 05/03/2014 1139   CREATININE 1.24 02/01/2020 0940   CREATININE 1.22 08/09/2014 1122      Component Value Date/Time   CALCIUM 9.4 02/01/2020 0940   CALCIUM 8.6 05/03/2014 1139   ALKPHOS 58 02/01/2020 0940   ALKPHOS 94 05/03/2014 1139   AST 14 (L) 02/01/2020 0940   AST 15 05/03/2014 1139   ALT 15 02/01/2020 0940   ALT 26 05/03/2014 1139   BILITOT 0.6 02/01/2020 0940   BILITOT 0.5 05/03/2014 1139       RADIOGRAPHIC STUDIES: I have personally reviewed the  radiological images as listed and agreed with the findings in the report. No results found.   ASSESSMENT & PLAN:  CLL (chronic lymphocytic leukemia) ([place]) # CLL/SLL- relapsed most recently s/p Gazyva x6 cycles; OCT 2021-improved mediastinal adenopathy ~2.5cm; ~3cm retroperitoneal adenopathy-there is no splenomegaly.  Continue surveillance.  #Chronic abdominal pain-unclear etiology-worsening [urine porphyrins were slightly abnormal ; plasma fractionated porphyrin- Normal]- awaiting pain pump placement next week.  Will increase MS Contin 30 mg twice daily; also continue oxycodone as prescribed.  Recommend evaluation with palliative care/Josh in 2 weeks for pain management.  #Hyperkalemia-5.5 /CKD-III-secondary renal insufficiency/creatinine 1.3- [baseline 1.4; Dr.Lateef]. continue LoKelma as per Nephrology.   # Anemia-hemoglobin 9.6 ;Secondary to CKD versus progressive leukemia -stable proceed with Aranesp today.   # Urinary pressure- prostate enlargement on CT scan- UA-NEG; PSA- WNL; s/p Dr.Wolff.    # DISPOSITION:  # Aranesp today # in 2 weeks-cbc; possible aranesp;  Josh-paliative  Care re: pain management # 1 month- MD; labs- cbc/cmpLDH; HOLD tube;possible aranesp;- Dr.B  # I reviewed the blood work- with the patient in detail; also reviewed the imaging independently [as summarized above]; and with the patient in detail.     No orders of the defined types were placed in this encounter.  All questions were answered. The patient knows to call the clinic with any problems, questions or concerns.      Cammie Sickle, MD 02/01/2020 10:49 AM

## 2020-02-01 NOTE — Assessment & Plan Note (Addendum)
#   CLL/SLL- relapsed most recently s/p Gazyva x6 cycles; OCT 2021-improved mediastinal adenopathy ~2.5cm; ~3cm retroperitoneal adenopathy-there is no splenomegaly.  Continue surveillance.  #Chronic abdominal pain-unclear etiology-worsening [urine porphyrins were slightly abnormal ; plasma fractionated porphyrin- Normal]- awaiting pain pump placement next week.  Will increase MS Contin 30 mg twice daily; also continue oxycodone as prescribed.  Recommend evaluation with palliative care/Josh in 2 weeks for pain management.  #Hyperkalemia-5.5 /CKD-III-secondary renal insufficiency/creatinine 1.3- [baseline 1.4; Dr.Lateef]. continue LoKelma as per Nephrology.   # Anemia-hemoglobin 9.6 ;Secondary to CKD versus progressive leukemia -stable proceed with Aranesp today.   # Urinary pressure- prostate enlargement on CT scan- UA-NEG; PSA- WNL; s/p Dr.Wolff.    # DISPOSITION:  # Aranesp today # in 2 weeks-cbc; possible aranesp;  Josh-paliative  Care re: pain management # 1 month- MD; labs- cbc/cmpLDH; HOLD tube;possible aranesp;- Dr.B  # I reviewed the blood work- with the patient in detail; also reviewed the imaging independently [as summarized above]; and with the patient in detail.

## 2020-02-02 ENCOUNTER — Other Ambulatory Visit: Payer: Self-pay

## 2020-02-02 ENCOUNTER — Encounter
Admission: RE | Admit: 2020-02-02 | Discharge: 2020-02-02 | Disposition: A | Discharge status: Home / Self Care | Payer: Medicare Other | Source: Ambulatory Visit | Attending: Neurosurgery | Admitting: Neurosurgery

## 2020-02-02 DIAGNOSIS — Z01812 Encounter for preprocedural laboratory examination: Secondary | ICD-10-CM | POA: Insufficient documentation

## 2020-02-02 HISTORY — DX: Gastro-esophageal reflux disease without esophagitis: K21.9

## 2020-02-02 LAB — APTT: aPTT: 27 seconds (ref 24–36)

## 2020-02-02 LAB — BASIC METABOLIC PANEL
Anion gap: 10 (ref 5–15)
BUN: 28 mg/dL — ABNORMAL HIGH (ref 8–23)
CO2: 28 mmol/L (ref 22–32)
Calcium: 9.5 mg/dL (ref 8.9–10.3)
Chloride: 101 mmol/L (ref 98–111)
Creatinine, Ser: 1.29 mg/dL — ABNORMAL HIGH (ref 0.61–1.24)
GFR calc non Af Amer: 56 mL/min — ABNORMAL LOW (ref >60)
Glucose, Bld: 139 mg/dL — ABNORMAL HIGH (ref 70–99)
Potassium: 4.7 mmol/L (ref 3.5–5.1)
Sodium: 139 mmol/L (ref 135–145)

## 2020-02-02 LAB — URINALYSIS, ROUTINE W REFLEX MICROSCOPIC
Bilirubin Urine: NEGATIVE
Glucose, UA: NEGATIVE mg/dL
Hgb urine dipstick: NEGATIVE
Ketones, ur: NEGATIVE mg/dL
Leukocytes,Ua: NEGATIVE
Nitrite: NEGATIVE
Protein, ur: NEGATIVE mg/dL
Specific Gravity, Urine: 1.019 (ref 1.005–1.030)
pH: 5 (ref 5.0–8.0)

## 2020-02-02 LAB — CBC
HCT: 31.3 % — ABNORMAL LOW (ref 39.0–52.0)
Hemoglobin: 9.7 g/dL — ABNORMAL LOW (ref 13.0–17.0)
MCH: 33.4 pg (ref 26.0–34.0)
MCHC: 31 g/dL (ref 30.0–36.0)
MCV: 107.9 fL — ABNORMAL HIGH (ref 80.0–100.0)
Platelets: 164 10*3/uL (ref 150–400)
RBC: 2.9 MIL/uL — ABNORMAL LOW (ref 4.22–5.81)
RDW: 15.5 % (ref 11.5–15.5)
WBC: 5.6 10*3/uL (ref 4.0–10.5)
nRBC: 0 % (ref 0.0–0.2)

## 2020-02-02 LAB — SURGICAL PCR SCREEN
MRSA, PCR: NEGATIVE
Staphylococcus aureus: POSITIVE — AB

## 2020-02-02 LAB — PROTIME-INR
INR: 1 (ref 0.8–1.2)
Prothrombin Time: 13 seconds (ref 11.4–15.2)

## 2020-02-02 NOTE — Progress Notes (Signed)
  [facility] Perioperative Services: Pre-Admission/Anesthesia Testing  Abnormal Lab Notification  Date: 02/02/20  Name: James [name] MRN:   [id number]  Re: Abnormal labs noted during PAT appointment  Provider Notified: Deetta Perla, MD Notification mode: Routed and/or faxed via [place] of concern: Lab Results  Component Value Date   STAPHAUREUS POSITIVE (A) 02/02/2020   MRSAPCR NEGATIVE 02/02/2020    Notes: Patient is scheduled for a INTRATHECAL PUMP & CATHETER IMPLANT (Right ) on 02/07/2020. This is a Community education officer; no formal response required.   Honor Loh, MSN, APRN, FNP-C, CEN [facility]  Peri-operative Services Nurse Practitioner Phone: [phone] 02/02/20 1:49 PM

## 2020-02-02 NOTE — Patient Instructions (Signed)
Your procedure is scheduled on: Feb 07, 2020 MONDAY Report to Day Surgery on the 2nd floor of the [facility]. To find out your arrival time, please call [phone] between 1PM - 3PM on: Friday Feb 04, 2020  REMEMBER: Instructions that are not followed completely may result in serious medical risk, up to and including death; or upon the discretion of your surgeon and anesthesiologist your surgery may need to be rescheduled.  Do not eat food after midnight the night before surgery.  No gum chewing, lozengers or hard candies.  You may however, drink CLEAR liquids up to 2 hours before you are scheduled to arrive for your surgery. Do not drink anything within 2 hours of your scheduled arrival time.  Clear liquids include: - water   Do NOT drink anything that is not on this list.  Type 1 and Type 2 diabetics should only drink water.     TAKE THESE MEDICATIONS THE MORNING OF SURGERY WITH A SIP OF WATER: PAIN MEDS PROTONIX (take one the night before and one on the morning of surgery - helps to prevent nausea after surgery.)    Stop Metformin 2 days prior to surgery. LAST DOSE Friday 02-04-2020    Follow recommendations from Cardiologist, Pulmonologist or PCP regarding stopping Aspirin, Coumadin, Plavix, Eliquis, Pradaxa, or Pletal. STOP PLAVIX 7 DAYS BEFORE SURGERY.  One week prior to surgery: Stop Anti-inflammatories (NSAIDS) such as Advil, Aleve, Ibuprofen, Motrin, Naproxen, Naprosyn and Aspirin based products such as Excedrin, Goodys Powder, BC Powder. Stop ANY OVER THE COUNTER supplements until after surgery. (You may continue taking Tylenol, Vitamin D, Vitamin B, and multivitamin.)  No Alcohol for 24 hours before or after surgery.  No Smoking including e-cigarettes for 24 hours prior to surgery.  No chewable tobacco products for at least 6 hours prior to surgery.  No nicotine patches on the day of surgery.  Do not use any "recreational" drugs for at least a week prior  to your surgery.  Please be advised that the combination of cocaine and anesthesia may have negative outcomes, up to and including death. If you test positive for cocaine, your surgery will be cancelled.  On the morning of surgery brush your teeth with toothpaste and water, you may rinse your mouth with mouthwash if you wish. Do not swallow any toothpaste or mouthwash.  Do not wear jewelry, make-up, hairpins, clips or nail polish.  Do not wear lotions, powders, or perfumes.   Do not shave 48 hours prior to surgery.   Contact lenses, hearing aids and dentures may not be worn into surgery.  Do not bring valuables to the hospital. [facility] is not responsible for any missing/lost belongings or valuables.   Use CHG Soap as directed on instruction sheet.  Notify your doctor if there is any change in your medical condition (cold, fever, infection).  Wear comfortable clothing (specific to your surgery type) to the hospital.  Plan for stool softeners for home use; pain medications have a tendency to cause constipation. You can also help prevent constipation by eating foods high in fiber such as fruits and vegetables and drinking plenty of fluids as your diet allows.  After surgery, you can help prevent lung complications by doing breathing exercises.  Take deep breaths and cough every 1-2 hours. Your doctor may order a device called an Incentive Spirometer to help you take deep breaths. When coughing or sneezing, hold a pillow firmly against your incision with both hands. This is called "splinting." Doing  this helps protect your incision. It also decreases belly discomfort.  If you are being admitted to the hospital overnight, leave your suitcase in the car. After surgery it may be brought to your room.  If you are being discharged the day of surgery, you will not be allowed to drive home. You will need a responsible adult (18 years or older) to drive you home and stay with you that  night.   If you are taking public transportation, you will need to have a responsible adult (18 years or older) with you. Please confirm with your physician that it is acceptable to use public transportation.   Please call the [place] Dept. at [phone] if you have any questions about these instructions.  Visitation Policy:  Patients undergoing a surgery or procedure may have one family member or support person with them as long as that person is not COVID-19 positive or experiencing its symptoms.  That person may remain in the waiting area during the procedure.  Inpatient Visitation Update:   In an effort to ensure the safety of our team members and our patients, we are implementing a change to our visitation policy:  Effective Monday, Aug. 9, at 7 a.m., inpatients will be allowed one support person.  o The support person may change daily.  o The support person must pass our screening, gel in and out, and wear a mask at all times, including in the patient's room.  o Patients must also wear a mask when staff or their support person are in the room.  o Masking is required regardless of vaccination status.  Systemwide, no visitors 17 or younger.

## 2020-02-03 ENCOUNTER — Other Ambulatory Visit
Admission: RE | Admit: 2020-02-03 | Discharge: 2020-02-03 | Disposition: A | Discharge status: Home / Self Care | Payer: Medicare Other | Source: Ambulatory Visit | Attending: Neurosurgery | Admitting: Neurosurgery

## 2020-02-03 DIAGNOSIS — Z01812 Encounter for preprocedural laboratory examination: Secondary | ICD-10-CM | POA: Diagnosis present

## 2020-02-03 DIAGNOSIS — Z20822 Contact with and (suspected) exposure to covid-19: Secondary | ICD-10-CM | POA: Diagnosis not present

## 2020-02-03 LAB — SARS CORONAVIRUS 2 (TAT 6-24 HRS): SARS Coronavirus 2: NEGATIVE

## 2020-02-03 LAB — TYPE AND SCREEN
ABO/RH(D): A POS
Antibody Screen: NEGATIVE

## 2020-02-04 ENCOUNTER — Other Ambulatory Visit: Payer: Self-pay | Admitting: *Deleted

## 2020-02-04 DIAGNOSIS — C9112 Chronic lymphocytic leukemia of B-cell type in relapse: Secondary | ICD-10-CM

## 2020-02-04 DIAGNOSIS — C8300 Small cell B-cell lymphoma, unspecified site: Secondary | ICD-10-CM

## 2020-02-04 MED ORDER — OXYCODONE-ACETAMINOPHEN 5-325 MG PO TABS: 1.0000 | ORAL_TABLET | Freq: Four times a day (QID) | ORAL | 0 refills | Status: DC | PRN

## 2020-02-06 ENCOUNTER — Ambulatory Visit
Admission: RE | Admit: 2020-02-06 | Discharge: 2020-02-06 | Disposition: A | Discharge status: Home / Self Care | Payer: Medicare Other | Source: Ambulatory Visit | Attending: Neurosurgery | Admitting: Neurosurgery

## 2020-02-06 ENCOUNTER — Other Ambulatory Visit: Payer: Self-pay

## 2020-02-06 DIAGNOSIS — M4807 Spinal stenosis, lumbosacral region: Secondary | ICD-10-CM

## 2020-02-06 MED ORDER — CEFAZOLIN SODIUM-DEXTROSE 2-4 GM/100ML-% IV SOLN
2.0000 g | INTRAVENOUS | Status: AC
  Administered 2020-02-07: 2 g via INTRAVENOUS

## 2020-02-06 MED ORDER — SODIUM CHLORIDE 0.9 % IV SOLN: INTRAVENOUS | Status: DC

## 2020-02-06 MED ORDER — ORAL CARE MOUTH RINSE: 15.0000 mL | Freq: Once | OROMUCOSAL | Status: AC

## 2020-02-06 MED ORDER — CHLORHEXIDINE GLUCONATE 0.12 % MT SOLN: 15.0000 mL | Freq: Once | OROMUCOSAL | Status: AC

## 2020-02-06 NOTE — Progress Notes (Signed)
Pharmacy Antibiotic Note  James [name] is a 69 y.o. male admitted on (Not on file) with surgical prophylaxis.  Pharmacy has been consulted for Cefazolin dosing.  Plan: Cefazolin 2 gm IV X 1 60 min pre-op.      No data recorded.  Recent Labs  Lab 02/01/20 0940 02/02/20 1021  WBC 5.7 5.6  CREATININE 1.24 1.29*    Estimated Creatinine Clearance: 51.6 mL/min (A) (by C-G formula based on SCr of 1.29 mg/dL (H)).    No Known Allergies  Antimicrobials this admission:   >>    >>   Dose adjustments this admission:   Microbiology results:  BCx:   UCx:    Sputum:    MRSA PCR:   Thank you for allowing pharmacy to be a part of this patient's care.  [name] D 02/06/2020 10:54 PM

## 2020-02-07 ENCOUNTER — Encounter
Admission: RE | Disposition: A | Discharge status: Home / Self Care | Payer: Self-pay | Source: Ambulatory Visit | Attending: Neurosurgery

## 2020-02-07 ENCOUNTER — Ambulatory Visit: Payer: Medicare Other | Admitting: Certified Registered"

## 2020-02-07 ENCOUNTER — Ambulatory Visit
Admission: RE | Admit: 2020-02-07 | Discharge: 2020-02-07 | Disposition: A | Discharge status: Home / Self Care | Payer: Medicare Other | Source: Ambulatory Visit | Attending: Neurosurgery | Admitting: Neurosurgery

## 2020-02-07 ENCOUNTER — Other Ambulatory Visit: Payer: Self-pay

## 2020-02-07 ENCOUNTER — Ambulatory Visit: Payer: Medicare Other

## 2020-02-07 DIAGNOSIS — F17210 Nicotine dependence, cigarettes, uncomplicated: Secondary | ICD-10-CM | POA: Diagnosis not present

## 2020-02-07 DIAGNOSIS — C9112 Chronic lymphocytic leukemia of B-cell type in relapse: Secondary | ICD-10-CM

## 2020-02-07 DIAGNOSIS — Z419 Encounter for procedure for purposes other than remedying health state, unspecified: Secondary | ICD-10-CM

## 2020-02-07 DIAGNOSIS — I10 Essential (primary) hypertension: Secondary | ICD-10-CM | POA: Diagnosis not present

## 2020-02-07 DIAGNOSIS — Z7982 Long term (current) use of aspirin: Secondary | ICD-10-CM | POA: Insufficient documentation

## 2020-02-07 DIAGNOSIS — K219 Gastro-esophageal reflux disease without esophagitis: Secondary | ICD-10-CM | POA: Insufficient documentation

## 2020-02-07 DIAGNOSIS — Z8249 Family history of ischemic heart disease and other diseases of the circulatory system: Secondary | ICD-10-CM | POA: Diagnosis not present

## 2020-02-07 DIAGNOSIS — Z7902 Long term (current) use of antithrombotics/antiplatelets: Secondary | ICD-10-CM | POA: Insufficient documentation

## 2020-02-07 DIAGNOSIS — C8300 Small cell B-cell lymphoma, unspecified site: Secondary | ICD-10-CM

## 2020-02-07 DIAGNOSIS — C9110 Chronic lymphocytic leukemia of B-cell type not having achieved remission: Secondary | ICD-10-CM | POA: Diagnosis not present

## 2020-02-07 DIAGNOSIS — E119 Type 2 diabetes mellitus without complications: Secondary | ICD-10-CM | POA: Insufficient documentation

## 2020-02-07 DIAGNOSIS — G894 Chronic pain syndrome: Secondary | ICD-10-CM | POA: Insufficient documentation

## 2020-02-07 DIAGNOSIS — Z7984 Long term (current) use of oral hypoglycemic drugs: Secondary | ICD-10-CM | POA: Insufficient documentation

## 2020-02-07 DIAGNOSIS — E785 Hyperlipidemia, unspecified: Secondary | ICD-10-CM | POA: Diagnosis not present

## 2020-02-07 DIAGNOSIS — Z79899 Other long term (current) drug therapy: Secondary | ICD-10-CM | POA: Diagnosis not present

## 2020-02-07 HISTORY — PX: INTRATHECAL PUMP IMPLANT: SHX6809

## 2020-02-07 LAB — GLUCOSE, CAPILLARY
Glucose-Capillary: 133 mg/dL — ABNORMAL HIGH (ref 70–99)
Glucose-Capillary: 188 mg/dL — ABNORMAL HIGH (ref 70–99)

## 2020-02-07 SURGERY — INTRATHECAL PUMP IMPLANT
Anesthesia: General | Laterality: Left

## 2020-02-07 MED ORDER — OXYCODONE HCL 5 MG PO TABS
ORAL_TABLET | ORAL | Status: AC
  Filled 2020-02-07: qty 1

## 2020-02-07 MED ORDER — DEXAMETHASONE SODIUM PHOSPHATE 10 MG/ML IJ SOLN
INTRAMUSCULAR | Status: DC | PRN
  Administered 2020-02-07: 10 mg via INTRAVENOUS

## 2020-02-07 MED ORDER — FENTANYL CITRATE (PF) 100 MCG/2ML IJ SOLN
INTRAMUSCULAR | Status: AC
  Filled 2020-02-07: qty 2

## 2020-02-07 MED ORDER — GLYCOPYRROLATE 0.2 MG/ML IJ SOLN
INTRAMUSCULAR | Status: DC | PRN
  Administered 2020-02-07: .2 mg via INTRAVENOUS

## 2020-02-07 MED ORDER — VANCOMYCIN HCL 1000 MG IV SOLR
INTRAVENOUS | Status: DC | PRN
  Administered 2020-02-07: 1000 mg via TOPICAL

## 2020-02-07 MED ORDER — LIDOCAINE HCL (CARDIAC) PF 100 MG/5ML IV SOSY
PREFILLED_SYRINGE | INTRAVENOUS | Status: DC | PRN
  Administered 2020-02-07: 80 mg via INTRAVENOUS

## 2020-02-07 MED ORDER — VASOPRESSIN 20 UNIT/ML IV SOLN
INTRAVENOUS | Status: DC | PRN
  Administered 2020-02-07: 4 [IU] via INTRAVENOUS
  Administered 2020-02-07: 2 [IU] via INTRAVENOUS

## 2020-02-07 MED ORDER — SODIUM CHLORIDE (PF) 0.9 % IJ SOLN
INTRAMUSCULAR | Status: DC | PRN
  Administered 2020-02-07: 19 mL

## 2020-02-07 MED ORDER — MIDAZOLAM HCL 2 MG/2ML IJ SOLN
INTRAMUSCULAR | Status: AC
  Filled 2020-02-07: qty 2

## 2020-02-07 MED ORDER — VASOPRESSIN 20 UNIT/ML IV SOLN
INTRAVENOUS | Status: AC
  Filled 2020-02-07: qty 1

## 2020-02-07 MED ORDER — MIDAZOLAM HCL 2 MG/2ML IJ SOLN
INTRAMUSCULAR | Status: DC | PRN
  Administered 2020-02-07 (×2): 1 mg via INTRAVENOUS

## 2020-02-07 MED ORDER — PHENYLEPHRINE HCL (PRESSORS) 10 MG/ML IV SOLN
INTRAVENOUS | Status: DC | PRN
  Administered 2020-02-07: 100 ug via INTRAVENOUS
  Administered 2020-02-07: 200 ug via INTRAVENOUS

## 2020-02-07 MED ORDER — NALOXONE HCL 0.4 MG/0.4ML IJ SOAJ: INTRAMUSCULAR | 0 refills | Status: DC

## 2020-02-07 MED ORDER — FENTANYL CITRATE (PF) 100 MCG/2ML IJ SOLN
25.0000 ug | INTRAMUSCULAR | Status: DC | PRN
  Administered 2020-02-07 (×2): 50 ug via INTRAVENOUS

## 2020-02-07 MED ORDER — ONDANSETRON HCL 4 MG/2ML IJ SOLN
INTRAMUSCULAR | Status: DC | PRN
  Administered 2020-02-07 (×2): 4 mg via INTRAVENOUS

## 2020-02-07 MED ORDER — ACETAMINOPHEN 10 MG/ML IV SOLN
INTRAVENOUS | Status: DC | PRN
  Administered 2020-02-07: 1000 mg via INTRAVENOUS

## 2020-02-07 MED ORDER — CHLORHEXIDINE GLUCONATE 0.12 % MT SOLN
OROMUCOSAL | Status: AC
  Administered 2020-02-07: 15 mL via OROMUCOSAL
  Filled 2020-02-07: qty 15

## 2020-02-07 MED ORDER — PHENYLEPHRINE HCL (PRESSORS) 10 MG/ML IV SOLN
INTRAVENOUS | Status: AC
  Filled 2020-02-07: qty 1

## 2020-02-07 MED ORDER — OXYCODONE HCL 5 MG PO TABS: 5.0000 mg | ORAL_TABLET | Freq: Four times a day (QID) | ORAL | 0 refills | Status: DC | PRN

## 2020-02-07 MED ORDER — DEXAMETHASONE SODIUM PHOSPHATE 10 MG/ML IJ SOLN
INTRAMUSCULAR | Status: AC
  Filled 2020-02-07: qty 1

## 2020-02-07 MED ORDER — EPHEDRINE SULFATE 50 MG/ML IJ SOLN
INTRAMUSCULAR | Status: DC | PRN
  Administered 2020-02-07 (×5): 5 mg via INTRAVENOUS

## 2020-02-07 MED ORDER — BUPIVACAINE HCL 0.5 % IJ SOLN
INTRAMUSCULAR | Status: DC | PRN
  Administered 2020-02-07: 8 mL
  Administered 2020-02-07: 10 mL

## 2020-02-07 MED ORDER — ONDANSETRON HCL 4 MG/2ML IJ SOLN
INTRAMUSCULAR | Status: AC
  Filled 2020-02-07: qty 4

## 2020-02-07 MED ORDER — FENTANYL CITRATE (PF) 100 MCG/2ML IJ SOLN
INTRAMUSCULAR | Status: AC
  Administered 2020-02-07: 50 ug via INTRAVENOUS
  Filled 2020-02-07: qty 2

## 2020-02-07 MED ORDER — ROCURONIUM BROMIDE 100 MG/10ML IV SOLN
INTRAVENOUS | Status: DC | PRN
  Administered 2020-02-07: 50 mg via INTRAVENOUS

## 2020-02-07 MED ORDER — EPHEDRINE 5 MG/ML INJ
INTRAVENOUS | Status: AC
  Filled 2020-02-07: qty 10

## 2020-02-07 MED ORDER — CEFAZOLIN SODIUM-DEXTROSE 2-4 GM/100ML-% IV SOLN
INTRAVENOUS | Status: AC
  Filled 2020-02-07: qty 100

## 2020-02-07 MED ORDER — ROCURONIUM BROMIDE 10 MG/ML (PF) SYRINGE
PREFILLED_SYRINGE | INTRAVENOUS | Status: AC
  Filled 2020-02-07: qty 10

## 2020-02-07 MED ORDER — OXYCODONE HCL 5 MG PO TABS: 5.0000 mg | ORAL_TABLET | ORAL | 0 refills | Status: DC | PRN

## 2020-02-07 MED ORDER — PROMETHAZINE HCL 25 MG/ML IJ SOLN: 6.2500 mg | INTRAMUSCULAR | Status: DC | PRN

## 2020-02-07 MED ORDER — GLYCOPYRROLATE 0.2 MG/ML IJ SOLN
INTRAMUSCULAR | Status: AC
  Filled 2020-02-07: qty 1

## 2020-02-07 MED ORDER — PROPOFOL 500 MG/50ML IV EMUL
INTRAVENOUS | Status: AC
  Filled 2020-02-07: qty 50

## 2020-02-07 MED ORDER — OXYCODONE HCL 5 MG PO TABS
5.0000 mg | ORAL_TABLET | Freq: Once | ORAL | Status: AC | PRN
  Administered 2020-02-07: 5 mg via ORAL

## 2020-02-07 MED ORDER — LIDOCAINE HCL (PF) 2 % IJ SOLN
INTRAMUSCULAR | Status: AC
  Filled 2020-02-07: qty 5

## 2020-02-07 MED ORDER — ACETAMINOPHEN 10 MG/ML IV SOLN
INTRAVENOUS | Status: AC
  Filled 2020-02-07: qty 100

## 2020-02-07 MED ORDER — OXYCODONE HCL 5 MG/5ML PO SOLN: 5.0000 mg | Freq: Once | ORAL | Status: AC | PRN

## 2020-02-07 MED ORDER — METHOCARBAMOL 500 MG PO TABS: 500.0000 mg | ORAL_TABLET | Freq: Four times a day (QID) | ORAL | 0 refills | Status: DC | PRN

## 2020-02-07 MED ORDER — MEPERIDINE HCL 50 MG/ML IJ SOLN: 6.2500 mg | INTRAMUSCULAR | Status: DC | PRN

## 2020-02-07 MED ORDER — SUGAMMADEX SODIUM 200 MG/2ML IV SOLN
INTRAVENOUS | Status: DC | PRN
  Administered 2020-02-07: 200 mg via INTRAVENOUS

## 2020-02-07 MED ORDER — FENTANYL CITRATE (PF) 100 MCG/2ML IJ SOLN
INTRAMUSCULAR | Status: DC | PRN
Start: 2020-02-07 — End: 2020-02-07
  Administered 2020-02-07 (×2): 50 ug via INTRAVENOUS

## 2020-02-07 MED ORDER — SODIUM CHLORIDE 0.9 % IV SOLN
INTRAVENOUS | Status: DC | PRN
  Administered 2020-02-07: 30 ug/min via INTRAVENOUS

## 2020-02-07 MED ORDER — PROPOFOL 10 MG/ML IV BOLUS
INTRAVENOUS | Status: DC | PRN
  Administered 2020-02-07: 110 mg via INTRAVENOUS

## 2020-02-07 SURGICAL SUPPLY — 60 items
ADH SKN CLS APL DERMABOND .7 (GAUZE/BANDAGES/DRESSINGS) ×1
AGENT HMST MTR 8 SURGIFLO (HEMOSTASIS) ×1
APL PRP STRL LF DISP 70% ISPRP (MISCELLANEOUS) ×2
BINDER ABDOMINAL 12 ML 46-62 (SOFTGOODS) ×3 IMPLANT
BLADE SURG 15 STRL LF DISP TIS (BLADE) ×1 IMPLANT
BLADE SURG 15 STRL SS (BLADE) ×3
CANISTER SUCT 1200ML W/VALVE (MISCELLANEOUS) ×6 IMPLANT
CATH ASCENDA 1PIECE (Catheter) ×3 IMPLANT
CHLORAPREP W/TINT 26 (MISCELLANEOUS) ×6 IMPLANT
CNTNR SPEC 2.5X3XGRAD LEK (MISCELLANEOUS) ×1
CONT SPEC 4OZ STER OR WHT (MISCELLANEOUS) ×2
CONT SPEC 4OZ STRL OR WHT (MISCELLANEOUS) ×1
CONTAINER SPEC 2.5X3XGRAD LEK (MISCELLANEOUS) ×1 IMPLANT
COUNTER NEEDLE 20/40 LG (NEEDLE) ×6 IMPLANT
COVER LIGHT HANDLE STERIS (MISCELLANEOUS) ×9 IMPLANT
COVER WAND RF STERILE (DRAPES) ×3 IMPLANT
DERMABOND ADVANCED (GAUZE/BANDAGES/DRESSINGS) ×2
DERMABOND ADVANCED .7 DNX12 (GAUZE/BANDAGES/DRESSINGS) ×1 IMPLANT
DRAPE C-ARM 42X72 X-RAY (DRAPES) ×6 IMPLANT
DRAPE LAPAROTOMY 100X77 ABD (DRAPES) ×3 IMPLANT
DRAPE SURG 17X11 SM STRL (DRAPES) ×12 IMPLANT
DRSG TEGADERM 4X4.75 (GAUZE/BANDAGES/DRESSINGS) ×9 IMPLANT
DRSG TELFA 4X3 1S NADH ST (GAUZE/BANDAGES/DRESSINGS) ×3 IMPLANT
ELECT REM PT RETURN 9FT ADLT (ELECTROSURGICAL) ×3
ELECTRODE REM PT RTRN 9FT ADLT (ELECTROSURGICAL) ×1 IMPLANT
GAUZE SPONGE 4X4 12PLY STRL (GAUZE/BANDAGES/DRESSINGS) ×3 IMPLANT
GLOVE BIOGEL PI IND STRL 7.0 (GLOVE) ×1 IMPLANT
GLOVE BIOGEL PI IND STRL 8 (GLOVE) ×2 IMPLANT
GLOVE BIOGEL PI INDICATOR 7.0 (GLOVE) ×2
GLOVE BIOGEL PI INDICATOR 8 (GLOVE) ×4
GLOVE SURG SYN 7.0 (GLOVE) ×6 IMPLANT
GLOVE SURG SYN 8.0 (GLOVE) ×6 IMPLANT
GOWN STRL REUS W/ TWL LRG LVL3 (GOWN DISPOSABLE) ×1 IMPLANT
GOWN STRL REUS W/ TWL XL LVL3 (GOWN DISPOSABLE) ×1 IMPLANT
GOWN STRL REUS W/TWL LRG LVL3 (GOWN DISPOSABLE) ×3
GOWN STRL REUS W/TWL XL LVL3 (GOWN DISPOSABLE) ×3
GRADUATE 1200CC STRL 31836 (MISCELLANEOUS) ×3 IMPLANT
KIT REFILL (MISCELLANEOUS) ×2
KIT REFILL CATH SYNCHROMED II (MISCELLANEOUS) ×1 IMPLANT
KIT TURNOVER KIT A (KITS) ×3 IMPLANT
MARKER SKIN DUAL TIP RULER LAB (MISCELLANEOUS) ×6 IMPLANT
NEEDLE HYPO 22GX1.5 SAFETY (NEEDLE) ×3 IMPLANT
NS IRRIG 1000ML POUR BTL (IV SOLUTION) ×3 IMPLANT
PACK LAMINECTOMY NEURO (CUSTOM PROCEDURE TRAY) ×3 IMPLANT
PAD ARMBOARD 7.5X6 YLW CONV (MISCELLANEOUS) ×3 IMPLANT
PASSER CATH 38CM DISP (INSTRUMENTS) ×3 IMPLANT
POUCH TYRX NEURO LRG (Mesh General) ×3 IMPLANT
PUMP SYNCHROMED II 40ML RESVR (Neuro Prosthesis/Implant) ×3 IMPLANT
SPOGE SURGIFLO 8M (HEMOSTASIS) ×2
SPONGE SURGIFLO 8M (HEMOSTASIS) ×1 IMPLANT
SUT ETH BLK MONO 3 0 FS 1 12/B (SUTURE) ×6 IMPLANT
SUT POLYSORB 2-0 5X18 GS-10 (SUTURE) ×27 IMPLANT
SUT SILK 2 0 PERMA HAND 18 BK (SUTURE) ×18 IMPLANT
SUT VIC AB 0 CT1 18XCR BRD 8 (SUTURE) ×3 IMPLANT
SUT VIC AB 0 CT1 8-18 (SUTURE) ×9
SYR 20ML LL LF (SYRINGE) ×6 IMPLANT
TAPE TRANSPORE STRL 2 31045 (GAUZE/BANDAGES/DRESSINGS) ×3 IMPLANT
TOWEL OR 17X26 4PK STRL BLUE (TOWEL DISPOSABLE) ×6 IMPLANT
TUBING CONNECTING 10 (TUBING) ×2 IMPLANT
TUBING CONNECTING 10' (TUBING) ×1

## 2020-02-07 NOTE — Discharge Summary (Signed)
  Procedure: ITP insertion Procedure date: 02/07/2020 Diagnosis: chronic pain    History: James [name] is s/p insertion of ITP POD0: Tolerated procedure well. Evaluated in post op recovery still disoriented from anesthesia but able to answer questions and obey commands.   Physical Exam: Vitals:   02/07/20 0616 02/07/20 0932  BP: 122/66 137/67  Pulse: 76 68  Resp: 16 15  Temp: 98.4 F (36.9 C) 98.9 F (37.2 C)  SpO2: 100% 98%    General: Alert and oriented, lying in bed Strength:5/5 throughout  Sensation: intact and symmetric throughout  Skin: incisions C/D/I  Data:  Recent Labs  Lab 02/01/20 0940 02/01/20 0940 02/02/20 1021  NA 137   < > 139  K 5.5*   < > 4.7  CL 99   < > 101  CO2 28   < > 28  BUN 23   < > 28*  CREATININE 1.24   < > 1.29*  GLUCOSE 145*   < > 139*  CALCIUM 9.4  --  9.5   < > = values in this interval not displayed.   Recent Labs  Lab 02/01/20 0940  AST 14*  ALT 15  ALKPHOS 58     Recent Labs  Lab 02/01/20 0940 02/01/20 0940 02/02/20 1021  WBC 5.7   < > 5.6  HGB 9.6*   < > 9.7*  HCT 30.4*   < > 31.3*  PLT 167  --  164   < > = values in this interval not displayed.   Recent Labs  Lab 02/02/20 1021  APTT 27  INR 1.0         Assessment/Plan:  James [name] is POD0 s/p insertion of ITP.  Once he is able to ambulate, tolerate PO, void, he will be able to be discharged to home.   Lonell Face, NP Department of Neurosurgery

## 2020-02-07 NOTE — Interval H&P Note (Signed)
History and Physical Interval Note:  02/07/2020 6:47 AM  James [name]  has presented today for surgery, with the diagnosis of g89.4 chronic pain.  The various methods of treatment have been discussed with the patient and family. After consideration of risks, benefits and other options for treatment, the patient has consented to  Procedure(s): INTRATHECAL PUMP & CATHETER IMPLANT (Right) as a surgical intervention.  The patient's history has been reviewed, patient examined, no change in status, stable for surgery.  I have reviewed the patient's chart and labs.  Questions were answered to the patient's satisfaction.     Deetta Perla

## 2020-02-07 NOTE — H&P (Signed)
James [name] is an 69 y.o. male.   Chief Complaint: Chronic pain HPI: James [name] is here for evaluation of ongoing chronic abdominal pain for which he has been treated by Dr. Dossie Arbour with pain medication. He does have a history of CLL. He states that the pain is centered mostly in the abdominal area and is very severe. He does not endorse any pain going down his lower extremities. He feels that the pain medication has not been working effectively over the past year and does start to cause more problems. He did undergo a fentanyl intrathecal injection recently and he did say that he felt better that day but he was unsure if it was related just to the injection. He did not notice any side effects with the injection. At this time, he feels the pain is too severe for any activity and would like to consider a permanent pump implant.   Past Medical History:  Diagnosis Date  . CLL (chronic lymphocytic leukemia) ([place])   . Constipation   . Depression   . Diabetes mellitus without complication ([place])   . GERD (gastroesophageal reflux disease)   . Hematuria   . Hyperlipidemia   . Hypertension   . Therapeutic opioid induced constipation     Past Surgical History:  Procedure Laterality Date  . CATARACT EXTRACTION W/ INTRAOCULAR LENS IMPLANT Bilateral   . CHOLECYSTECTOMY  1983  . COLONOSCOPY WITH PROPOFOL N/A 10/27/2017   Procedure: COLONOSCOPY WITH PROPOFOL;  Surgeon: Manya Silvas, MD;  Location: [facility] ENDOSCOPY;  Service: Endoscopy;  Laterality: N/A;  . ESOPHAGOGASTRODUODENOSCOPY (EGD) WITH PROPOFOL N/A 11/20/2016   Procedure: ESOPHAGOGASTRODUODENOSCOPY (EGD) WITH PROPOFOL;  Surgeon: Manya Silvas, MD;  Location: [facility] ENDOSCOPY;  Service: Endoscopy;  Laterality: N/A;  . ESOPHAGOGASTRODUODENOSCOPY (EGD) WITH PROPOFOL N/A 10/27/2017   Procedure: ESOPHAGOGASTRODUODENOSCOPY (EGD) WITH PROPOFOL;  Surgeon: Manya Silvas, MD;  Location: [facility] ENDOSCOPY;  Service: Endoscopy;  Laterality: N/A;  . LOWER  EXTREMITY ANGIOGRAPHY Right 05/19/2017   Procedure: LOWER EXTREMITY ANGIOGRAPHY;  Surgeon: Algernon Huxley, MD;  Location: [facility] CV LAB;  Service: Cardiovascular;  Laterality: Right;  . LOWER EXTREMITY ANGIOGRAPHY Left 08/10/2018   Procedure: LOWER EXTREMITY ANGIOGRAPHY;  Surgeon: Algernon Huxley, MD;  Location: [facility] LAB;  Service: Cardiovascular;  Laterality: Left;    Family History  Problem Relation Age of Onset  . Hypertension Sister    Social History:  reports that he has been smoking cigarettes. He has a 23.50 pack-year smoking history. He has never used smokeless tobacco. He reports current alcohol use of about 1.0 standard drink of alcohol per week. He reports that he does not use drugs.  Allergies: No Known Allergies  Medications Prior to Admission  Medication Sig Dispense Refill  . aspirin-sod bicarb-citric acid (ALKA-SELTZER) 325 MG TBEF tablet Take 325 mg by mouth every 6 (six) hours as needed (acid reflux).    Marland Kitchen atorvastatin (LIPITOR) 10 MG tablet Take 1 tablet (10 mg total) by mouth daily. 30 tablet 11  . clopidogrel (PLAVIX) 75 MG tablet Take 1 tablet by mouth once daily (Patient taking differently: Take 75 mg by mouth daily. ) 90 tablet 0  . Ensure (ENSURE) Take 237 mLs by mouth 3 (three) times daily between meals.     . folic acid (FOLVITE) 1 MG tablet Take 1 tablet (1 mg total) by mouth daily. 90 tablet 1  . metFORMIN (GLUCOPHAGE) 1000 MG tablet Take 1,000 mg by mouth daily with breakfast.     .  naloxegol oxalate (MOVANTIK) 25 MG TABS tablet Take 1 tablet (25 mg total) by mouth daily. 30 tablet 0  . oxycodone (OXY-IR) 5 MG capsule Take 2 capsules (10 mg total) by mouth every 4 (four) hours as needed. 90 capsule 0  . pantoprazole (PROTONIX) 40 MG tablet Take 40 mg by mouth at bedtime.    . sucralfate (CARAFATE) 1 g tablet Take 1 g by mouth 4 (four) times daily -  with meals and at bedtime.    . tamsulosin (FLOMAX) 0.4 MG CAPS capsule Take 0.4 mg by mouth daily.     Marland Kitchen acyclovir (ZOVIRAX) 400 MG tablet One pill a day [to prevent shingles] (Patient not taking: Reported on 01/21/2020) 30 tablet 3  . aspirin EC 81 MG tablet Take 81 mg by mouth daily. (Patient not taking: Reported on 01/21/2020)    . Calcium Carb-Cholecalciferol (CALCIUM PLUS D3 ABSORBABLE) [phone] MG-UNIT CAPS Take 1 capsule by mouth 2 (two) times daily with a meal. 60 capsule 5  . Cholecalciferol (VITAMIN D3) 125 MCG (5000 UT) CAPS Take 1 capsule (5,000 Units total) by mouth daily with breakfast. Take along with calcium and magnesium. 90 capsule 1  . dexamethasone (DECADRON) 4 MG tablet Start 2 days prior to infusion; Take for 2 days. Do not take on the day of infusion. (Patient not taking: Reported on 01/31/2020) 60 tablet 3  . iron polysaccharides (NU-IRON) 150 MG capsule Take 1 capsule (150 mg total) by mouth daily. (Patient not taking: Reported on 01/21/2020) 30 capsule 1  . montelukast (SINGULAIR) 10 MG tablet Take 1 tablet (10 mg total) by mouth at bedtime. Start 2 days prior to infusion. Take it for 4 days. (Patient not taking: Reported on 01/21/2020) 60 tablet 0  . morphine (MS CONTIN) 30 MG 12 hr tablet Take 1 tablet (30 mg total) by mouth every 12 (twelve) hours. 30 tablet 0  . oxyCODONE-acetaminophen (PERCOCET/ROXICET) 5-325 MG tablet Take 1 tablet by mouth every 6 (six) hours as needed for severe pain. 90 tablet 0    Results for orders placed or performed during the hospital encounter of 02/07/20 (from the past 48 hour(s))  Glucose, capillary     Status: Abnormal   Collection Time: 02/07/20  6:17 AM  Result Value Ref Range   Glucose-Capillary 133 (H) 70 - 99 mg/dL    Comment: Glucose reference range applies only to samples taken after fasting for at least 8 hours.   MR LUMBAR SPINE WO CONTRAST  Result Date: 02/07/2020 CLINICAL DATA:  Chronic low back pain. EXAM: MRI LUMBAR SPINE WITHOUT CONTRAST TECHNIQUE: Multiplanar, multisequence MR imaging of the lumbar spine was performed. No  intravenous contrast was administered. COMPARISON:  Lumbar spine radiographs 06/16/2019 FINDINGS: Segmentation:  Standard. Alignment:  Normal. Vertebrae: No fracture, suspicious osseous lesion, or significant marrow edema. Conus medullaris and cauda equina: Conus extends to the L1-2 level. Conus and cauda equina appear normal. Paraspinal and other soft tissues: Partially imaged T2 hyperintense lesions in both kidneys compatible with cysts in measuring up to at least 5.5 cm on the left. Aortic atherosclerosis. Disc levels: T12-L1 and L1-2: Negative. L2-3: Mild disc bulging, a small right subarticular disc extrusion with slight superior migration, and mild facet hypertrophy result in borderline spinal stenosis and borderline right lateral recess stenosis without neural foraminal stenosis. L3-4: Normal disc.  Mild facet hypertrophy without stenosis. L4-5: Mild disc bulging greater to the left and moderate facet hypertrophy result in mild left lateral recess stenosis without spinal or neural foraminal stenosis.  L5-S1: Mild disc bulging, a shallow central disc protrusion with annular fissure, and mild facet hypertrophy result in mild right greater than left lateral recess stenosis and borderline to mild right neural foraminal stenosis without spinal stenosis. IMPRESSION: 1. Mild lumbar spondylosis and up to moderate facet hypertrophy without high-grade stenosis. 2. Small right subarticular disc extrusion at L2-3 without definite neural impingement. 3. Mild lateral recess stenosis at L4-5 and L5-S1. Electronically Signed   By: Logan Bores M.D.   On: 02/07/2020 06:32    Review of Systems General ROS: Negative Psychological ROS: Negative Ophthalmic ROS: Negative ENT ROS: Negative Hematological and Lymphatic ROS: Negative  Endocrine ROS: Negative Respiratory ROS: Negative Cardiovascular ROS: Negative Gastrointestinal ROS: Positive for epigastric pain Genito-Urinary ROS: Negative Musculoskeletal ROS:  Negative Neurological ROS: Negative for numbness or weakness Dermatological ROS: Negative  Blood pressure 122/66, pulse 76, temperature 98.4 F (36.9 C), temperature source Temporal, resp. rate 16, SpO2 100 %. Physical Exam  General appearance: Alert, cooperative, appears uncomfortable Head: Normocephalic, atraumatic Eyes: Normal, EOM intact Oropharynx: Wearing facemask CV: Regular rate and rhythm Pulm: Clear to auscultation Ext: No edema in LE bilaterally  Neurologic exam:  Mental status: alertness: alert, affect: normal Speech: fluent and clear Motor:strength symmetric 5/5 in bilateral lower extremities Sensory: intact to light touch in bilateral lower extremities Gait: normal   Assessment/Plan Plan for intrathecal pain pump placement today  Deetta Perla, MD 02/07/2020, 6:43 AM

## 2020-02-07 NOTE — Discharge Instructions (Addendum)
Please hold Plavix and ASA for 7 days postoperatively (restart 02/15/2020)  Your surgeon has performed an operation to implant an intrathecal pump for pain management. Activity    No bending, lifting, or twisting ("BLT"). Avoid lifting objects heavier than 10 pounds (gallon milk jug).  Where possible, avoid household activities that involve lifting, bending, pushing, or pulling such as laundry, vacuuming, grocery shopping, and childcare. Try to arrange for help from friends and family for these activities while your back heals.  Increase physical activity slowly as tolerated.  Taking short walks is encouraged, but avoid strenuous exercise. Do not jog, run, bicycle, lift weights, or participate in any other exercises unless specifically allowed by your doctor. Avoid prolonged sitting, including car rides.  Talk to your doctor before resuming sexual activity.  You should not drive until cleared by your doctor.  Until released by your doctor, you should not return to work or school.  You should rest at home and let your body heal.   You may shower two days after your surgery.  After showering, lightly dab your incision dry. Do not take a tub bath or go swimming for 3 weeks, or until approved by your doctor at your follow-up appointment.  If you smoke, we strongly recommend that you quit.  Smoking has been proven to interfere with normal healing in your back and will dramatically reduce the success rate of your surgery. Please contact [facility] (800-QUIT-NOW) and use the resources at www.[facility].com for assistance in stopping smoking.  Surgical Incision   If you have a dressing on your incision, you may remove it three days after your surgery. Keep your incision area clean and dry.  If you have staples or stitches on your incision, you should have a follow up scheduled for removal. If you do not have staples or stitches, you will have steri-strips (small pieces of surgical tape) or Dermabond  glue. The steri-strips/glue should begin to peel away within about a week (it is fine if the steri-strips fall off before then). If the strips are still in place one week after your surgery, you may gently remove them.  Diet            You may return to your usual diet. Be sure to stay hydrated.  When to Contact us  Although your surgery and recovery will likely be uneventful, you may have some residual numbness, aches, and pains in your back and/or legs. This is normal and should improve in the next few weeks.  However, should you experience any of the following, contact us immediately: . New numbness or weakness . Pain that is progressively getting worse, and is not relieved by your pain medications or rest . Bleeding, redness, swelling, pain, or drainage from surgical incision . Chills or flu-like symptoms . Fever greater than 101.0 F (38.3 C) . Problems with bowel or bladder functions . Difficulty breathing or shortness of breath . Warmth, tenderness, or swelling in your calf  Contact Information  . During office hours (Monday-Friday 9 am to 5 pm), please call your physician at [phone] . After hours and weekends, please call [phone] and an answering service will put you in touch with either Dr. Lacinda Axon or Dr. Izora Ribas.  . For a life-threatening emergency, call [place]   1) The drugs that you were given will stay in your system until tomorrow so for the next 24 hours you should not:  A) Drive an automobile B) Make any  legal decisions C) Drink any alcoholic beverage   2) You may resume regular meals tomorrow.  Today it is better to start with liquids and gradually work up to solid foods.  You may eat anything you prefer, but it is better to start with liquids, then soup and crackers, and gradually work up to solid foods.   3) Please notify your doctor immediately if you have any unusual bleeding, trouble breathing, redness  and pain at the surgery site, drainage, fever, or pain not relieved by medication.    4) Additional Instructions:        Please contact your physician with any problems or Same Day Surgery at [phone], Monday through Friday 6 am to 4 pm, or [place] at [facility] number at [phone].

## 2020-02-07 NOTE — Anesthesia Procedure Notes (Signed)
Procedure Name: Intubation Performed by: Kelton Pillar, CRNA Pre-anesthesia Checklist: Patient identified, Emergency Drugs available, Suction available and Patient being monitored Patient Re-evaluated:Patient Re-evaluated prior to induction Oxygen Delivery Method: Circle system utilized Preoxygenation: Pre-oxygenation with 100% oxygen Induction Type: IV induction Ventilation: Mask ventilation without difficulty Laryngoscope Size: McGraph and 3 Tube size: 7.0 mm Number of attempts: 1 Airway Equipment and Method: Stylet and Oral airway Placement Confirmation: ETT inserted through vocal cords under direct vision,  positive ETCO2,  breath sounds checked- equal and bilateral and CO2 detector Secured at: 21 cm Tube secured with: Tape Dental Injury: Teeth and Oropharynx as per pre-operative assessment

## 2020-02-07 NOTE — Op Note (Signed)
Operative Note  SURGERY DATE: 02/07/2020  PRE-OP DIAGNOSIS: Chronic pain syndrome  POST-OP DIAGNOSIS:Post-Op Diagnosis Codes: Chronic pain syndrome  Procedure(s) with comments: Lumbar intrathecal catheter placement Left lower quadrant abdominal pump placement  SURGEON:  Malen Gauze, MD       Lonell Face, NP-assisting  ANESTHESIA:General  OPERATIVE FINDINGS:Successful placement of intrathecal pain pump and catheter  Indication Mr Headley seen in clinic on 10/11after having a successful intrathecal morphine injection forback and legpain. MRI of the spine revealed no concerning stenosis at the level of the implant in the thoracic spine.  She had a known history of a prior lumbar fusion and decompression.  The patient wished to proceed to permanent pump placement to achieve better pain control. Risks including  weakness, hematoma, infection, failure of pain relief, post-operative pain, need for revision, stroke, heart attack, pneumonia, and spinal cord injury were discussed.    Procedure The patient was brought to the operating room where vascular access was obtained andhe wasintubated by the anesthesia service.  Antibiotics were given. The patient was placed in a lateral position with the right side up.. Fluoroscopy was used to confirm planned incision in the lumbar area. The patient was prepped and draped in a sterile fashion ensuring both the lumbar and abdominal incisions were incorporated in the sterile field. A hard time out was performed. Local anesthetic was instilled into incisions.   The lumbar incision was opened sharply over the midline and continued down to the fascia of the L3 level.Fluoroscopy was used to confirm an entry point to allow for intrathecal access at L3-4.  A Touhy needle was placed through the fascia and advanced over the lamina until clear fluid was obtained.  Next, the intrathecal catheter was placed through the Touhy  needle and fluoroscopy used to ensure that it rested at the T7/8 level.  The needle and stylette were then removed.  Clear CSF was seen to flow from the catheter.  The anchoring device was placed around the catheter and secured to the fascia.  The catheter was then cold above the fascia and secured with multiple silk sutures.  The pump was filled with saline.  Next, the left abdominal incision was opened and taken 2 cm deep through the subcutaneous tissue where the fascia was identified there.  The pocket for the pump placement was expanded inferiorly.  Once this was done, the tunneling device was used to tunneled the catheter from the lumbar to the abdominal incision.  Once this was done, the catheter was connected to the extension and then to the pump.  A needle was placed in the pump to aspirate CSF to ensure flow.  The pump was then placed within the pocket and secured with silk sutures.     Next, hemostasis was achieved. Each incision was irrigated with antibiotic saline and vancomycin powder placed. Then, each incision was closed with combination of 0, 2-0 vicryls. The skin was closed with 3-0 Nylon in the lumbar area and the abdomen. Sterile dressings were applied. The patient was returned to supine position and extubated. The patient was seen to be moving lower extremities symmetrically and was taken to PACU for recovery. The family was updated and all questions answered.   ESTIMATED BLOOD LOSS: 50cc  SPECIMENS None  IMPLANT CATH ASCENDA 1PIECE - [id number]  Inventory Item: CATH ASCENDA 1PIECE Serial no.:  Model/Cat no.: [id number]  Implant name: CATH ASCENDA 1PIECE - [id number] Laterality: Left Area: Back   Manufacturer: [employer] NEUR[employer]OD PAIN MGMT Date of  Manufacture:    Action: Implanted Number Used: 1   Device Identifier:  Device Identifier TypeStarr Sinclair LARGE - [id number]  Inventory Item: [facility] NEURO LARGE Serial no.:  Model/Cat no.: [id number]  Implant name:  Starr Sinclair LARGE - [id number] Laterality: Left Area: Back   Manufacturer: [place] Date of Manufacture:    Action: Implanted Number Used: 1   Device Identifier:  Device Identifier Type:    PUMP SYNCHROMED 2 - [id number]  Inventory Item: PUMP SYNCHROMED 2 Serial no.: [id number] Model/Cat no.: [id number]  Implant name: PUMP SYNCHROMED 2 - [id number] Laterality: Left Area: Back   Manufacturer: [employer] NEUROMOD PAIN MGMT Date of Manufacture:    Action: Implanted Number Used: 1   Device Identifier:  Device Identifier Type:       I performed the case in its entiretywith the assistance of Lonell Face, NP  Deetta Perla, [place]

## 2020-02-07 NOTE — Anesthesia Preprocedure Evaluation (Signed)
Anesthesia Evaluation  Patient identified by MRN, date of birth, ID band Patient awake    Reviewed: Allergy & Precautions, NPO status , Patient's Chart, lab work & pertinent test results  History of Anesthesia Complications Negative for: history of anesthetic complications  Airway Mallampati: II  TM Distance: >3 FB Neck ROM: Full    Dental  (+) Edentulous Upper, Edentulous Lower   Pulmonary neg sleep apnea, neg COPD, Current Smoker and Patient abstained from smoking.,    breath sounds clear to auscultation- rhonchi (-) wheezing      Cardiovascular hypertension, Pt. on medications + Peripheral Vascular Disease  (-) CAD, (-) Past MI, (-) Cardiac Stents and (-) CABG  Rhythm:Regular Rate:Normal - Systolic murmurs and - Diastolic murmurs    Neuro/Psych neg Seizures PSYCHIATRIC DISORDERS Depression negative neurological ROS     GI/Hepatic Neg liver ROS, GERD  ,  Endo/Other  diabetes, Oral Hypoglycemic Agents  Renal/GU Renal InsufficiencyRenal disease     Musculoskeletal  (+) Arthritis ,   Abdominal (+) - obese,   Peds  Hematology negative hematology ROS (+) anemia ,   Anesthesia Other Findings Past Medical History: No date: CLL (chronic lymphocytic leukemia) ([facility]) No date: Constipation No date: Depression No date: Diabetes mellitus without complication ([facility]) No date: GERD (gastroesophageal reflux disease) No date: Hematuria No date: Hyperlipidemia No date: Hypertension No date: Therapeutic opioid induced constipation   Reproductive/Obstetrics                            Anesthesia Physical Anesthesia Plan  ASA: III  Anesthesia Plan: General   Post-op Pain Management:    Induction: Intravenous  PONV Risk Score and Plan: 0 and Ondansetron  Airway Management Planned: Oral ETT  Additional Equipment:   Intra-op Plan:   Post-operative Plan: Extubation in OR  Informed Consent: I  have reviewed the patients History and Physical, chart, labs and discussed the procedure including the risks, benefits and alternatives for the proposed anesthesia with the patient or authorized representative who has indicated his/her understanding and acceptance.     Dental advisory given  Plan Discussed with: CRNA and Anesthesiologist  Anesthesia Plan Comments:         Anesthesia Quick Evaluation

## 2020-02-07 NOTE — Anesthesia Postprocedure Evaluation (Signed)
Anesthesia Post Note  Patient: James [name]  Procedure(s) Performed: INTRATHECAL PUMP & CATHETER IMPLANT (Left )  Patient location during evaluation: PACU Anesthesia Type: General Level of consciousness: awake and alert Pain management: pain level controlled Vital Signs Assessment: post-procedure vital signs reviewed and stable Respiratory status: spontaneous breathing and respiratory function stable Cardiovascular status: stable Anesthetic complications: no   No complications documented.   Last Vitals:  Vitals:   02/07/20 1018 02/07/20 1028  BP: (!) 110/57 (!) 115/53  Pulse: 79 83  Resp: 11 14  Temp: 37.1 C (!) 36.2 C  SpO2: 99% 99%    Last Pain:  Vitals:   02/07/20 1028  TempSrc: Temporal  PainSc: 5                  Dannika Hilgeman K

## 2020-02-07 NOTE — Transfer of Care (Signed)
Immediate Anesthesia Transfer of Care Note  Patient: James [name]  Procedure(s) Performed: INTRATHECAL PUMP & CATHETER IMPLANT (Left )  Patient Location: PACU  Anesthesia Type:General  Level of Consciousness: awake, alert , oriented and patient cooperative  Airway & Oxygen Therapy: Patient Spontanous Breathing and Patient connected to face mask oxygen  Post-op Assessment: Report given to RN and Post -op Vital signs reviewed and stable  Post vital signs: Reviewed and stable  Last Vitals:  Vitals Value Taken Time  BP 137/67 02/07/20 0932  Temp 37.2 C 02/07/20 0932  Pulse 84 02/07/20 0945  Resp 18 02/07/20 0935  SpO2 99 % 02/07/20 0945  Vitals shown include unvalidated device data.  Last Pain:  Vitals:   02/07/20 0932  TempSrc:   PainSc: Asleep      Patients Stated Pain Goal: 3 (00/37/94 4461)  Complications: No complications documented.

## 2020-02-07 NOTE — OR Nursing (Signed)
Ambulatory to bathroom, toler well.  Tolerating po's and voided.  Discharged to home.

## 2020-02-08 ENCOUNTER — Encounter: Payer: Self-pay | Admitting: Neurosurgery

## 2020-02-09 ENCOUNTER — Telehealth: Payer: Self-pay

## 2020-02-09 NOTE — Telephone Encounter (Signed)
States he did not receive a printout indicating the pump information. I instructed him to come by the office to get the pump interrogated so we can determine when the pump needs to be filled.

## 2020-02-09 NOTE — Telephone Encounter (Signed)
He had his pain pump installed on Monday and wants to know when he is supposed to come here to get it filled.

## 2020-02-14 ENCOUNTER — Inpatient Hospital Stay: Payer: Medicare Other

## 2020-02-14 NOTE — Progress Notes (Signed)
Nutrition Follow-up:  Patient with stage IV CLL.  Recent placement of pain pump on 02/07/20 Palliative care following.   Spoke with patient via phone.  Patient reports abdominal pain.  Planning to see Palliative care tomorrow for medication adjustment until can get medication in pain pump.  Patient eating a "little".  Drank ensure and milk this am.  Has been eating a little bit of tomato and chicken soup and meatloaf.     Medications: reviewed  Labs: reviewed  Anthropometrics:   Weight 162 lb 9.6 oz on 10/6 stable  160 lb on 8/10 140 lb on 7/6   NUTRITION DIAGNOSIS: Inadequate oral intake continues with abdominal pain   INTERVENTION:  Will provide complimentary case of ensure enlive for pick up tomorrow, 10/19, next appointment.   Encouraged patient to continue to eat foods high in calories and protein as able with pain.      MONITORING, EVALUATION, GOAL: weight trends, intake   NEXT VISIT: Nov 15, phone f/u  Coady Train B. Zenia Resides, [place], [place] Registered Dietitian [phone] (mobile)

## 2020-02-15 ENCOUNTER — Other Ambulatory Visit: Payer: Self-pay

## 2020-02-15 ENCOUNTER — Other Ambulatory Visit: Payer: Self-pay | Admitting: Pain Medicine

## 2020-02-15 ENCOUNTER — Encounter: Payer: Self-pay | Admitting: Hospice and Palliative Medicine

## 2020-02-15 ENCOUNTER — Inpatient Hospital Stay: Payer: Medicare Other

## 2020-02-15 ENCOUNTER — Inpatient Hospital Stay (HOSPITAL_BASED_OUTPATIENT_CLINIC_OR_DEPARTMENT_OTHER): Payer: Medicare Other | Admitting: Hospice and Palliative Medicine

## 2020-02-15 ENCOUNTER — Telehealth: Payer: Self-pay

## 2020-02-15 VITALS — BP 141/87 | HR 104 | Temp 99.4°F | Resp 18 | Ht 68.0 in | Wt 158.9 lb

## 2020-02-15 DIAGNOSIS — C8300 Small cell B-cell lymphoma, unspecified site: Secondary | ICD-10-CM

## 2020-02-15 DIAGNOSIS — G893 Neoplasm related pain (acute) (chronic): Secondary | ICD-10-CM

## 2020-02-15 DIAGNOSIS — Z978 Presence of other specified devices: Secondary | ICD-10-CM

## 2020-02-15 DIAGNOSIS — G8929 Other chronic pain: Secondary | ICD-10-CM

## 2020-02-15 DIAGNOSIS — G894 Chronic pain syndrome: Secondary | ICD-10-CM

## 2020-02-15 DIAGNOSIS — D631 Anemia in chronic kidney disease: Secondary | ICD-10-CM

## 2020-02-15 DIAGNOSIS — Z515 Encounter for palliative care: Secondary | ICD-10-CM | POA: Diagnosis not present

## 2020-02-15 DIAGNOSIS — Z95828 Presence of other vascular implants and grafts: Secondary | ICD-10-CM

## 2020-02-15 DIAGNOSIS — N183 Chronic kidney disease, stage 3 unspecified: Secondary | ICD-10-CM | POA: Diagnosis not present

## 2020-02-15 DIAGNOSIS — C9110 Chronic lymphocytic leukemia of B-cell type not having achieved remission: Secondary | ICD-10-CM

## 2020-02-15 DIAGNOSIS — I70219 Atherosclerosis of native arteries of extremities with intermittent claudication, unspecified extremity: Secondary | ICD-10-CM

## 2020-02-15 LAB — CBC WITH DIFFERENTIAL/PLATELET
Abs Immature Granulocytes: 0.02 10*3/uL (ref 0.00–0.07)
Basophils Absolute: 0.1 10*3/uL (ref 0.0–0.1)
Basophils Relative: 1 %
Eosinophils Absolute: 0.1 10*3/uL (ref 0.0–0.5)
Eosinophils Relative: 1 %
HCT: 30.5 % — ABNORMAL LOW (ref 39.0–52.0)
Hemoglobin: 9.9 g/dL — ABNORMAL LOW (ref 13.0–17.0)
Immature Granulocytes: 0 %
Lymphocytes Relative: 33 %
Lymphs Abs: 2.2 10*3/uL (ref 0.7–4.0)
MCH: 34.9 pg — ABNORMAL HIGH (ref 26.0–34.0)
MCHC: 32.5 g/dL (ref 30.0–36.0)
MCV: 107.4 fL — ABNORMAL HIGH (ref 80.0–100.0)
Monocytes Absolute: 0.9 10*3/uL (ref 0.1–1.0)
Monocytes Relative: 13 %
Neutro Abs: 3.3 10*3/uL (ref 1.7–7.7)
Neutrophils Relative %: 52 %
Platelets: 152 10*3/uL (ref 150–400)
RBC: 2.84 MIL/uL — ABNORMAL LOW (ref 4.22–5.81)
RDW: 15.4 % (ref 11.5–15.5)
WBC: 6.5 10*3/uL (ref 4.0–10.5)
nRBC: 0 % (ref 0.0–0.2)

## 2020-02-15 MED ORDER — DARBEPOETIN ALFA 300 MCG/0.6ML IJ SOSY
300.0000 ug | PREFILLED_SYRINGE | Freq: Once | INTRAMUSCULAR | Status: AC
  Administered 2020-02-15: 300 ug via SUBCUTANEOUS
  Filled 2020-02-15: qty 0.6

## 2020-02-15 MED ORDER — PAIN MANAGEMENT IT PUMP REFILL: 1.0000 | Freq: Once | INTRATHECAL | 0 refills | Status: AC

## 2020-02-15 MED ORDER — PAIN MANAGEMENT IT PUMP REFILL: 1.0000 | Freq: Once | INTRATHECAL | 0 refills | Status: DC

## 2020-02-15 MED ORDER — OXYCODONE HCL 10 MG PO TABS: 10.0000 mg | ORAL_TABLET | ORAL | 0 refills | Status: DC | PRN

## 2020-02-15 NOTE — Telephone Encounter (Addendum)
Patient notified of pump refill appointment 03-02-2020 at 1300.  James [name] from [employer] notified that we would like her to attend appointment for pump refill.

## 2020-02-15 NOTE — Progress Notes (Signed)
The patient had the intrathecal pump implanted on 02/07/2020 by Dr. Lacinda Axon.

## 2020-02-15 NOTE — Progress Notes (Signed)
[place]  [phone] [phone]   Name: James [name] Date: 02/15/2020 MRN: [id number]  DOB: 06-Dec-1950  Patient Care Team: [name], MD as PCP - General (Internal Medicine) Cammie Sickle, MD as Consulting Physician (Internal Medicine)    REASON FOR CONSULTATION: James [name] is a 69 y.o. male with multiple medical problems including stage IV CLL (initially diagnosed in 2006) most recently treated with ibrutinib but was discontinued due to poor tolerance. He is now on treatment with Gazyva.  Patient has had severe and chronic abdominal pain of unclear etiology.  He has had extensive work-up including referral to [facility].  He has been followed by interventional pain management and is status post celiac plexus blocks without significant relief in symptoms.    Patient underwent ITP placement on 02/07/2020.  He was referred to palliative care to help address goals and manage ongoing symptoms..   SOCIAL HISTORY:     reports that he has been smoking cigarettes. He has a 23.50 pack-year smoking history. He has never used smokeless tobacco. He reports current alcohol use of about 1.0 standard drink of alcohol per week. He reports that he does not use drugs.   Patient is married and lives at home with his wife.  He has a son in [place].  Patient formally worked as a Administrator.  ADVANCE DIRECTIVES:  Does not have  CODE STATUS: DNR/DNI (MOST form completed on 07/08/19  PAST MEDICAL HISTORY: Past Medical History:  Diagnosis Date  . CLL (chronic lymphocytic leukemia) ([place])   . Constipation   . Depression   . Diabetes mellitus without complication ([place])   . GERD (gastroesophageal reflux disease)   . Hematuria   . Hyperlipidemia   . Hypertension   . Therapeutic opioid induced constipation     PAST SURGICAL HISTORY:  Past Surgical History:  Procedure Laterality Date  . CATARACT EXTRACTION W/  INTRAOCULAR LENS IMPLANT Bilateral   . CHOLECYSTECTOMY  1983  . COLONOSCOPY WITH PROPOFOL N/A 10/27/2017   Procedure: COLONOSCOPY WITH PROPOFOL;  Surgeon: Manya Silvas, MD;  Location: [facility] ENDOSCOPY;  Service: Endoscopy;  Laterality: N/A;  . ESOPHAGOGASTRODUODENOSCOPY (EGD) WITH PROPOFOL N/A 11/20/2016   Procedure: ESOPHAGOGASTRODUODENOSCOPY (EGD) WITH PROPOFOL;  Surgeon: Manya Silvas, MD;  Location: [facility] ENDOSCOPY;  Service: Endoscopy;  Laterality: N/A;  . ESOPHAGOGASTRODUODENOSCOPY (EGD) WITH PROPOFOL N/A 10/27/2017   Procedure: ESOPHAGOGASTRODUODENOSCOPY (EGD) WITH PROPOFOL;  Surgeon: Manya Silvas, MD;  Location: [facility] ENDOSCOPY;  Service: Endoscopy;  Laterality: N/A;  . INTRATHECAL PUMP IMPLANT Left 02/07/2020   Procedure: INTRATHECAL PUMP & CATHETER IMPLANT;  Surgeon: Deetta Perla, MD;  Location: [facility] ORS;  Service: Neurosurgery;  Laterality: Left;  . LOWER EXTREMITY ANGIOGRAPHY Right 05/19/2017   Procedure: LOWER EXTREMITY ANGIOGRAPHY;  Surgeon: Algernon Huxley, MD;  Location: [facility] CV LAB;  Service: Cardiovascular;  Laterality: Right;  . LOWER EXTREMITY ANGIOGRAPHY Left 08/10/2018   Procedure: LOWER EXTREMITY ANGIOGRAPHY;  Surgeon: Algernon Huxley, MD;  Location: [facility] [facility];  Service: Cardiovascular;  Laterality: Left;    HEMATOLOGY/ONCOLOGY HISTORY:  Oncology History Overview Note  # 2006- CLL STAGE IV; MAY 2011- WBC- 57K;Platelets-99;Hb-12/CT Bulky LN; BMBx- 80% Invol; del 11; START Benda-Ritux x4 [finished Sep 2011];   # July 2015-Progression; Sep 2015-START ibrutinib; CT scan DEC 2015- Improvement LN; Cont Ibrutinib 150m/d; NOV 2016 CT- 1-2CM LN [mild progression compared to Dec 2015];NOV 2016- FISH peripheral blood- NO MUTATIONS/CD-38 Positive; NOV 7th- CONT IBRUTINIB  2 pills/day; CT AUG 2017- STABLE;  DEC 6th PET- Mild RP LN/ Retrocrural LN  # OFF ibrutinib [? intol]- sep 2019- Jan 16th 2020; Re-start Ibrutinib; September 2020-stop ibrutinib [poor  tolerance/worsening anemia]  # Jan 16th 2020- start aranesp; HOLD while on [place].   # MARCH 11th 2021[name]   # DEC 2019- PAIN CONTRACT  # PALLIATIVE CARE- 06/21/2019-  # October 2019-bone marrow biopsy [worsening anemia]-question dyserythropoietic changes/small clone of CLL;  # [facility] HEM- NEG.  SA skin infection [Oct 2016] s/p clinda -------------------------------------------------------    DIAGNOSIS: CLL  STAGE:   IV      ;GOALS: palliative  CURRENT/MOST RECENT THERAPY : Gazyva [C]   CLL (chronic lymphocytic leukemia) ([place])  07/08/2019 -  Chemotherapy   The patient had obinutuzumab (GAZYVA) 100 mg in sodium chloride 0.9 % 100 mL (0.9615 mg/mL) chemo infusion, 100 mg, Intravenous, Once, 6 of 6 cycles Administration: 100 mg (07/08/2019), 900 mg (07/09/2019), 1,000 mg (07/26/2019), 1,000 mg (08/16/2019), 1,000 mg (08/02/2019), 1,000 mg (09/13/2019), 1,000 mg (10/11/2019), 1,000 mg (11/09/2019), 1,000 mg (12/07/2019)  for chemotherapy treatment.      ALLERGIES:  has No Known Allergies.  MEDICATIONS:  Current Outpatient Medications  Medication Sig Dispense Refill  . atorvastatin (LIPITOR) 10 MG tablet Take 1 tablet (10 mg total) by mouth daily. 30 tablet 11  . Ensure (ENSURE) Take 237 mLs by mouth 3 (three) times daily between meals.     . folic acid (FOLVITE) 1 MG tablet Take 1 tablet (1 mg total) by mouth daily. 90 tablet 1  . metFORMIN (GLUCOPHAGE) 1000 MG tablet Take 1,000 mg by mouth daily with breakfast.     . methocarbamol (ROBAXIN) 500 MG tablet Take 1 tablet (500 mg total) by mouth every 6 (six) hours as needed for muscle spasms. 80 tablet 0  . morphine (MS CONTIN) 30 MG 12 hr tablet Take 1 tablet (30 mg total) by mouth every 12 (twelve) hours. 30 tablet 0  . naloxegol oxalate (MOVANTIK) 25 MG TABS tablet Take 1 tablet (25 mg total) by mouth daily. 30 tablet 0  . pantoprazole (PROTONIX) 40 MG tablet Take 40 mg by mouth at bedtime.    . sucralfate (CARAFATE) 1 g  tablet Take 1 g by mouth 4 (four) times daily -  with meals and at bedtime.    . tamsulosin (FLOMAX) 0.4 MG CAPS capsule Take 0.4 mg by mouth daily.    Marland Kitchen acyclovir (ZOVIRAX) 400 MG tablet One pill a day [to prevent shingles] (Patient not taking: Reported on 01/21/2020) 30 tablet 3  . Calcium Carb-Cholecalciferol (CALCIUM PLUS D3 ABSORBABLE) [phone] MG-UNIT CAPS Take 1 capsule by mouth 2 (two) times daily with a meal. (Patient not taking: Reported on 02/15/2020) 60 capsule 5  . Cholecalciferol (VITAMIN D3) 125 MCG (5000 UT) CAPS Take 1 capsule (5,000 Units total) by mouth daily with breakfast. Take along with calcium and magnesium. (Patient not taking: Reported on 02/15/2020) 90 capsule 1  . clopidogrel (PLAVIX) 75 MG tablet Take 75 mg by mouth daily.    . Naloxone HCl 0.4 MG/0.4ML SOAJ For opioid overdose (Patient not taking: Reported on 02/15/2020) 0.4 mL 0  . Oxycodone HCl 10 MG TABS Take 1 tablet (10 mg total) by mouth every 4 (four) hours as needed. 60 tablet 0  . PAIN MANAGEMENT IT PUMP REFILL 1 each by Intrathecal route once for 1 dose. Medication: PF-Fentanyl 500 mcg/mL Total Volume: 40 mL Needed for 03-02-2020 $RemoveBefor'@1000'TFaINAEPLXIz$  1 each 0   No  current facility-administered medications for this visit.    VITAL SIGNS: BP (!) 141/87   Pulse (!) 104   Temp 99.4 F (37.4 C) (Tympanic)   Resp 18   Ht 5' 8"  (1.727 m)   Wt 158 lb 14.4 oz (72.1 kg)   SpO2 99%   BMI 24.16 kg/m  Filed Weights   02/15/20 0915  Weight: 158 lb 14.4 oz (72.1 kg)    Estimated body mass index is 24.16 kg/m as calculated from the following:   Height as of this encounter: 5' 8"  (1.727 m).   Weight as of this encounter: 158 lb 14.4 oz (72.1 kg).  LABS: CBC:    Component Value Date/Time   WBC 6.5 02/15/2020 0902   HGB 9.9 (L) 02/15/2020 0902   HGB 11.4 (L) 08/09/2014 1122   HCT 30.5 (L) 02/15/2020 0902   HCT 35.6 (L) 08/09/2014 1122   PLT 152 02/15/2020 0902   PLT 95 (L) 08/09/2014 1122   MCV 107.4 (H) 02/15/2020  0902   MCV 100 08/09/2014 1122   NEUTROABS 3.3 02/15/2020 0902   NEUTROABS 4.3 08/09/2014 1122   LYMPHSABS 2.2 02/15/2020 0902   LYMPHSABS 6.5 (H) 08/09/2014 1122   MONOABS 0.9 02/15/2020 0902   MONOABS 1.1 (H) 08/09/2014 1122   EOSABS 0.1 02/15/2020 0902   EOSABS 0.1 08/09/2014 1122   BASOSABS 0.1 02/15/2020 0902   BASOSABS 0.1 08/09/2014 1122   Comprehensive Metabolic Panel:    Component Value Date/Time   NA 139 02/02/2020 1021   NA 142 05/03/2014 1139   K 4.7 02/02/2020 1021   K 4.7 05/03/2014 1139   CL 101 02/02/2020 1021   CL 110 (H) 05/03/2014 1139   CO2 28 02/02/2020 1021   CO2 24 05/03/2014 1139   BUN 28 (H) 02/02/2020 1021   BUN 20 (H) 05/03/2014 1139   CREATININE 1.29 (H) 02/02/2020 1021   CREATININE 1.22 08/09/2014 1122   GLUCOSE 139 (H) 02/02/2020 1021   GLUCOSE 98 05/03/2014 1139   CALCIUM 9.5 02/02/2020 1021   CALCIUM 8.6 05/03/2014 1139   AST 14 (L) 02/01/2020 0940   AST 15 05/03/2014 1139   ALT 15 02/01/2020 0940   ALT 26 05/03/2014 1139   ALKPHOS 58 02/01/2020 0940   ALKPHOS 94 05/03/2014 1139   BILITOT 0.6 02/01/2020 0940   BILITOT 0.5 05/03/2014 1139   PROT 7.3 02/01/2020 0940   PROT 7.5 05/03/2014 1139   ALBUMIN 4.7 02/01/2020 0940   ALBUMIN 4.1 05/03/2014 1139    RADIOGRAPHIC STUDIES: DG Lumbar Spine 2-3 Views  Result Date: 02/07/2020 CLINICAL DATA:  Back surgery.  Intrathecal pain pump placement. EXAM: LUMBAR SPINE - 2-3 VIEW COMPARISON:  Lumbar spine radiographs 06/16/2019 FINDINGS: AP and lateral C-arm images were obtained. On the lateral view, there is placement needle overlying the mid lumbar spinal canal. Accurate determination of level is not possible on these images. There is a catheter extending into the thoracic spinal canal. IMPRESSION: Spinal localization images. Placement of catheter in the thoracic spinal canal with the tip not visualized Electronically Signed   By: Franchot Gallo M.D.   On: 02/07/2020 09:15   MR LUMBAR SPINE WO  CONTRAST  Result Date: 02/07/2020 CLINICAL DATA:  Chronic low back pain. EXAM: MRI LUMBAR SPINE WITHOUT CONTRAST TECHNIQUE: Multiplanar, multisequence MR imaging of the lumbar spine was performed. No intravenous contrast was administered. COMPARISON:  Lumbar spine radiographs 06/16/2019 FINDINGS: Segmentation:  Standard. Alignment:  Normal. Vertebrae: No fracture, suspicious osseous lesion, or significant marrow edema.  Conus medullaris and cauda equina: Conus extends to the L1-2 level. Conus and cauda equina appear normal. Paraspinal and other soft tissues: Partially imaged T2 hyperintense lesions in both kidneys compatible with cysts in measuring up to at least 5.5 cm on the left. Aortic atherosclerosis. Disc levels: T12-L1 and L1-2: Negative. L2-3: Mild disc bulging, a small right subarticular disc extrusion with slight superior migration, and mild facet hypertrophy result in borderline spinal stenosis and borderline right lateral recess stenosis without neural foraminal stenosis. L3-4: Normal disc.  Mild facet hypertrophy without stenosis. L4-5: Mild disc bulging greater to the left and moderate facet hypertrophy result in mild left lateral recess stenosis without spinal or neural foraminal stenosis. L5-S1: Mild disc bulging, a shallow central disc protrusion with annular fissure, and mild facet hypertrophy result in mild right greater than left lateral recess stenosis and borderline to mild right neural foraminal stenosis without spinal stenosis. IMPRESSION: 1. Mild lumbar spondylosis and up to moderate facet hypertrophy without high-grade stenosis. 2. Small right subarticular disc extrusion at L2-3 without definite neural impingement. 3. Mild lateral recess stenosis at L4-5 and L5-S1. Electronically Signed   By: Logan Bores M.D.   On: 02/07/2020 06:32   CT CHEST ABDOMEN PELVIS W CONTRAST  Result Date: 01/25/2020 CLINICAL DATA:  Acute on chronic abdominal pain, CLL EXAM: CT CHEST, ABDOMEN, AND PELVIS  WITH CONTRAST TECHNIQUE: Multidetector CT imaging of the chest, abdomen and pelvis was performed following the standard protocol during bolus administration of intravenous contrast. CONTRAST:  187mL OMNIPAQUE IOHEXOL 300 MG/ML SOLN, additional oral enteric contrast COMPARISON:  PET-CT, 07/22/2019, CT chest abdomen pelvis, 06/18/2019 FINDINGS: CT CHEST FINDINGS Cardiovascular: Aortic atherosclerosis. Normal heart size. Three-vessel coronary artery calcifications. No pericardial effusion. Mediastinum/Nodes: Slight interval decrease in size of enlarged mediastinal lymph nodes, an index pretracheal node measuring 2.6 x 1.2 cm, previously 2.9 x 1.4 cm (series 2, image 31). Thyroid gland, trachea, and esophagus demonstrate no significant findings. Lungs/Pleura: There are new clustered, tiny centrilobular nodules of the medial right upper lobe (series 4, image 97). No pleural effusion or pneumothorax. Musculoskeletal: No chest wall mass or suspicious bone lesions identified. CT ABDOMEN PELVIS FINDINGS Hepatobiliary: No focal liver abnormality is seen. Status post cholecystectomy. No biliary dilatation. Pancreas: Unremarkable. No pancreatic ductal dilatation or surrounding inflammatory changes. Spleen: Normal in size without significant abnormality. Adrenals/Urinary Tract: Adrenal glands are unremarkable. Bilateral renal cysts. Kidneys are otherwise normal, without renal calculi, solid lesion, or hydronephrosis. Bladder is unremarkable. Stomach/Bowel: Stomach is within normal limits. Appendix appears normal. No evidence of bowel wall thickening, distention, or inflammatory changes. Large burden of stool throughout the colon rectum. Vascular/Lymphatic: Severe aortic atherosclerosis. Bilateral common iliac artery stents. Numerous enlarged retroperitoneal lymph nodes are not significantly changed, largest index left retroperitoneal node or conglomerate measuring 3.8 x 2.7 cm (series 2, image 83). Reproductive: Prostatomegaly  with median lobe hypertrophy. Other: No abdominal wall hernia or abnormality. No abdominopelvic ascites. Musculoskeletal: No acute or significant osseous findings. IMPRESSION: 1. Slight interval decrease in size of enlarged mediastinal lymph nodes. 2. Numerous enlarged retroperitoneal lymph nodes are not significantly changed. 3. Constellation of findings is consistent with stable to slightly improved CLL. 4. No splenomegaly. 5. New clustered, tiny centrilobular nodules of the medial right upper lobe, consistent with nonspecific atypical infection or inflammation. 6. Prostatomegaly with median lobe hypertrophy. 7. Coronary artery disease.  Aortic Atherosclerosis (ICD10-I70.0). Electronically Signed   By: Eddie Candle M.D.   On: 01/25/2020 09:24   DG C-Arm 1-60 Min  Result  Date: 02/07/2020 CLINICAL DATA:  Intrathecal pain pump placement. EXAM: DG C-ARM 1-60 MIN FINDINGS: Refer to lumbar spine images dictated separately. IMPRESSION: Refer to lumbar spine is dictated separately. Electronically Signed   By: Franchot Gallo M.D.   On: 02/07/2020 09:22    PERFORMANCE STATUS (ECOG) : 1 - Symptomatic but completely ambulatory  Review of Systems Unless otherwise noted, a complete review of systems is negative.  Physical Exam General: NAD Pulmonary: Unlabored Abdomen: soft, nontender, + bowel sounds GU: no suprapubic tenderness Extremities: no edema, no joint deformities Skin: no rashes Neurological: Weakness but otherwise nonfocal  IMPRESSION: Patient was an add-on to my clinic schedule today for discussion of management of pain.  He continues to endorse persistent abdominal pain, which is currently rated as 5 out of 10.  He admits to having used the MS Contin and oxycodone more frequently than prescribed.  He says that the medications help but do not make the pain tolerable.  Patient underwent intrathecal pain pump placement on 02/07/2020.  Case discussed with Dr. Dossie Arbour and Dr. Rogue Bussing.  Dr.  Adalberto Cole office will reach out to patient to discuss initiation of the pain pump.  In the interim, I increased his oxycodone to 10 mg every 4 hours today.  The expectation is that eventually patient will be weaned from oral medications once the pain pump is started.  I note that patient has had progressive weight loss.  He is followed by nutrition.  Patient is not eating or drinking much due to pain.  PDMP reviewed  PLAN: -Continue current scope of treatment -Continue MS Contin 30 mg every 12 hours -Increase oxycodone 10 mg every 4 hours as needed for breakthrough pain (#60 tablets) -Prophylactic bowel regimen -Follow-up with Dr. Dossie Arbour -Follow-up MyChart visit in about a month   Patient expressed understanding and was in agreement with this plan. He also understands that He can call the clinic at any time with any questions, concerns, or complaints.     Time Total: 25 minutes  Visit consisted of counseling and education dealing with the complex and emotionally intense issues of symptom management and palliative care in the setting of serious and potentially life-threatening illness.Greater than 50%  of this time was spent counseling and coordinating care related to the above assessment and plan.  Signed by: Altha Harm, PhD, NP-C

## 2020-02-15 NOTE — Progress Notes (Signed)
Patient only eating soup as a meal once daily and 2-3 ensure bottles per day. Current weight at 158. He reports that he is nauseated constantly. He denies any vomiting or diarrhea. Last bm 10/17-medium size stool. He reports constipation. Patient reports abdominal/umblical pain 5/63 described as a "nagging stomach ache."

## 2020-02-22 ENCOUNTER — Other Ambulatory Visit: Payer: Self-pay

## 2020-02-22 ENCOUNTER — Ambulatory Visit (INDEPENDENT_AMBULATORY_CARE_PROVIDER_SITE_OTHER): Payer: Medicare Other

## 2020-02-22 ENCOUNTER — Encounter (INDEPENDENT_AMBULATORY_CARE_PROVIDER_SITE_OTHER): Payer: Self-pay | Admitting: Vascular Surgery

## 2020-02-22 ENCOUNTER — Encounter: Payer: Medicare Other | Admitting: Pain Medicine

## 2020-02-22 ENCOUNTER — Ambulatory Visit (INDEPENDENT_AMBULATORY_CARE_PROVIDER_SITE_OTHER): Payer: Medicare Other | Admitting: Vascular Surgery

## 2020-02-22 VITALS — BP 145/83 | HR 100 | Resp 16 | Wt 160.4 lb

## 2020-02-22 DIAGNOSIS — I70219 Atherosclerosis of native arteries of extremities with intermittent claudication, unspecified extremity: Secondary | ICD-10-CM | POA: Diagnosis not present

## 2020-02-22 DIAGNOSIS — I10 Essential (primary) hypertension: Secondary | ICD-10-CM

## 2020-02-22 DIAGNOSIS — E785 Hyperlipidemia, unspecified: Secondary | ICD-10-CM | POA: Diagnosis not present

## 2020-02-22 DIAGNOSIS — E119 Type 2 diabetes mellitus without complications: Secondary | ICD-10-CM

## 2020-02-22 NOTE — Progress Notes (Signed)
MRN : [id number]  James [name] is a 69 y.o. (06-16-1950) male who presents with chief complaint of  Chief Complaint  Patient presents with  . Follow-up    ultrasound follow up  .  History of Present Illness: Patient returns today in follow up of his PAD.  He has undergone bilateral lower extremity revascularization in the past.  He is currently doing well from a leg standpoint, but he is having severe pain from other issues.  He just had a pain pump installed but it has not yet become functional.  No new wounds or ulcers on the legs.  His ABIs today have dropped some on the right and are now 0.75 with strong monophasic waveforms.  His digital pressure was 75 on the right as well.  His left ABI is in the normal range at 0.96 with multiphasic waveforms and a normal digital pressure.  Current Outpatient Medications  Medication Sig Dispense Refill  . atorvastatin (LIPITOR) 10 MG tablet Take 1 tablet (10 mg total) by mouth daily. 30 tablet 11  . clopidogrel (PLAVIX) 75 MG tablet Take 75 mg by mouth daily.    . Ensure (ENSURE) Take 237 mLs by mouth 3 (three) times daily between meals.     . folic acid (FOLVITE) 1 MG tablet Take 1 tablet (1 mg total) by mouth daily. 90 tablet 1  . metFORMIN (GLUCOPHAGE) 1000 MG tablet Take 1,000 mg by mouth daily with breakfast.     . methocarbamol (ROBAXIN) 500 MG tablet Take 1 tablet (500 mg total) by mouth every 6 (six) hours as needed for muscle spasms. 80 tablet 0  . morphine (MS CONTIN) 30 MG 12 hr tablet Take 1 tablet (30 mg total) by mouth every 12 (twelve) hours. 30 tablet 0  . naloxegol oxalate (MOVANTIK) 25 MG TABS tablet Take 1 tablet (25 mg total) by mouth daily. 30 tablet 0  . Oxycodone HCl 10 MG TABS Take 1 tablet (10 mg total) by mouth every 4 (four) hours as needed. 60 tablet 0  . pantoprazole (PROTONIX) 40 MG tablet Take 40 mg by mouth at bedtime.    . sucralfate (CARAFATE) 1 g tablet Take 1 g by mouth 4 (four) times daily -  with meals and  at bedtime.    Marland Kitchen acyclovir (ZOVIRAX) 400 MG tablet One pill a day [to prevent shingles] (Patient not taking: Reported on 01/21/2020) 30 tablet 3  . Calcium Carb-Cholecalciferol (CALCIUM PLUS D3 ABSORBABLE) [phone] MG-UNIT CAPS Take 1 capsule by mouth 2 (two) times daily with a meal. (Patient not taking: Reported on 02/15/2020) 60 capsule 5  . Cholecalciferol (VITAMIN D3) 125 MCG (5000 UT) CAPS Take 1 capsule (5,000 Units total) by mouth daily with breakfast. Take along with calcium and magnesium. (Patient not taking: Reported on 02/15/2020) 90 capsule 1  . Naloxone HCl 0.4 MG/0.4ML SOAJ For opioid overdose (Patient not taking: Reported on 02/15/2020) 0.4 mL 0  . tamsulosin (FLOMAX) 0.4 MG CAPS capsule Take 0.4 mg by mouth daily. (Patient not taking: Reported on 02/22/2020)     No current facility-administered medications for this visit.    Past Medical History:  Diagnosis Date  . CLL (chronic lymphocytic leukemia) ([place])   . Constipation   . Depression   . Diabetes mellitus without complication ([place])   . GERD (gastroesophageal reflux disease)   . Hematuria   . Hyperlipidemia   . Hypertension   . Therapeutic opioid induced constipation     Past Surgical History:  Procedure Laterality Date  . CATARACT EXTRACTION W/ INTRAOCULAR LENS IMPLANT Bilateral   . CHOLECYSTECTOMY  1983  . COLONOSCOPY WITH PROPOFOL N/A 10/27/2017   Procedure: COLONOSCOPY WITH PROPOFOL;  Surgeon: Manya Silvas, MD;  Location[facility] END[facility]COPY;  Service: Endoscopy;  Laterality: N/A;  . ESOPHAGOGASTRODUODENOSCOPY (EGD) WITH PROPOFOL N/A 11/20/2016   Procedure: ESOPHAGOGASTRODUODENOSCOPY (EGD) WITH PROPOFOL;  Surgeon: Manya Silvas, MD;  Location: [facility] ENDOSCOPY;  Service: Endoscopy;  Laterality: N/A;  . ESOPHAGOGASTRODUODENOSCOPY (EGD) WITH PROPOFOL N/A 10/27/2017   Procedure: ESOPHAGOGASTRODUODENOSCOPY (EGD) WITH PROPOFOL;  Surgeon: Manya Silvas, MD;  Location: [facility] ENDOSCOPY;  Service: Endoscopy;  Laterality:  N/A;  . INTRATHECAL PUMP IMPLANT Left 02/07/2020   Procedure: INTRATHECAL PUMP & CATHETER IMPLANT;  Surgeon: Deetta Perla, MD;  Location: [facility] ORS;  Service: Neurosurgery;  Laterality: Left;  . LOWER EXTREMITY ANGIOGRAPHY Right 05/19/2017   Procedure: LOWER EXTREMITY ANGIOGRAPHY;  Surgeon: Algernon Huxley, MD;  Location: [facility] CV LAB;  Service: Cardiovascular;  Laterality: Right;  . LOWER EXTREMITY ANGIOGRAPHY Left 08/10/2018   Procedure: LOWER EXTREMITY ANGIOGRAPHY;  Surgeon: Algernon Huxley, MD;  Location: [place] CV LAB;  Service: Cardiovascular;  Laterality: Left;    Social History        Tobacco Use  . Smoking status: Current Every Day Smoker    Packs/day: 0.50    Years: 47.00    Pack years: 23.50    Types: Cigarettes  . Smokeless tobacco: Never Used  Substance Use Topics  . Alcohol use: Yes    Alcohol/week: 1.0 standard drinks    Types: 1 Glasses of wine per week    Comment: almost none in last 6 months  . Drug use: No    Family History      Family History  Problem Relation Age of Onset  . Hypertension Sister   no bleeding disorders, clotting disorders, or aneurysms  No Known Allergies  REVIEW OF SYSTEMS(Negative unless checked)  Constitutional: [x] ??????Weight loss[] ??????Fever[] ??????Chills Cardiac:[] ??????Chest pain[] ??????Chest pressure[] ??????Palpitations [] ??????Shortness of breath when laying flat [] ??????Shortness of breath at rest [] ??????Shortness of breath with exertion. Vascular: [x] ??????Pain in legs with walking[] ??????Pain in legsat rest[] ??????Pain in legs when laying flat [x] ??????Claudication [] ??????Pain in feet when walking [] ??????Pain in feet at rest [] ??????Pain in feet when laying flat [] ??????History of DVT [] ??????Phlebitis [x] ??????Swelling in legs [] ??????Varicose veins [x] ??????Non-healing ulcers Pulmonary: [] ??????Uses home oxygen [] ??????Productive  cough[] ??????Hemoptysis [] ??????Wheeze [] ??????COPD [] ??????Asthma Neurologic: [] ??????Dizziness [] ??????Blackouts [] ??????Seizures [] ??????History of stroke [] ??????History of TIA[] ??????Aphasia [] ??????Temporary blindness[] ??????Dysphagia [] ??????Weaknessor numbness in arms [] ??????Weakness or numbnessin legs Musculoskeletal: [x] ??????Arthritis [] ??????Joint swelling [] ??????Joint pain [] ??????Low back pain Hematologic:[] ??????Easy bruising[] ??????Easy bleeding [] ??????Hypercoagulable state [x] ??????Anemic  Gastrointestinal:[] ??????Blood in stool[] ??????Vomiting blood[x] ??????Gastroesophageal reflux/heartburn[] ??????Abdominal pain Genitourinary: [] ??????Chronic kidney disease [] ??????Difficulturination [] ??????Frequenturination [] ??????Burning with urination[] ??????Hematuria Skin: [] ??????Rashes [x] ??????Ulcers [x] ??????Wounds Psychological: [] ??????History of anxiety[] ??????History of major depression.    Physical Examination  BP (!) 145/83 (BP Location: Left Arm)   Pulse 100   Resp 16   Wt 160 lb 6.4 oz (72.8 kg)   BMI 24.39 kg/m  Gen:  WD/WN, NAD Head: [place]/AT, No temporalis wasting. Ear/Nose/Throat: Hearing grossly intact, nares w/o erythema or drainage Eyes: Conjunctiva clear. Sclera non-icteric Neck: Supple.  Trachea midline Pulmonary:  Good air movement, no use of accessory muscles.  Cardiac: RRR, no JVD Vascular:  Vessel Right Left  Radial Palpable Palpable                          PT  1+ palpable Palpable  DP  1+ palpable Palpable   Gastrointestinal: soft, non-tender/non-distended. No  guarding/reflex.  Musculoskeletal: M/S 5/5 throughout.  No deformity or atrophy.  No edema. Neurologic: Sensation grossly intact in extremities.  Symmetrical.  Speech is fluent.  Psychiatric: Judgment intact, Mood & affect appropriate for pt's clinical situation. Dermatologic: No rashes or ulcers noted.  No  cellulitis or open wounds.       Labs Recent Results (from the past 2160 hour(s))  Iron and TIBC     Status: None   Collection Time: 12/07/19  8:12 AM  Result Value Ref Range   Iron 95 45 - 182 ug/dL   TIBC 363 250 - 450 ug/dL   Saturation Ratios 26 17.9 - 39.5 %   UIBC 268 ug/dL    Comment: Performed at [facility], [address] [place], [place] [address]  Ferritin     Status: None   Collection Time: 12/07/19  8:12 AM  Result Value Ref Range   Ferritin 81 24 - 336 ng/mL    Comment: Performed at [facility], [address] [place], [place] [address]  Hold Tube- Blood Bank     Status: None   Collection Time: 12/07/19  8:12 AM  Result Value Ref Range   Blood Bank Specimen SAMPLE AVAILABLE FOR TESTING    Sample Expiration      12/10/2019,2359 Performed at [facility], [address] [place], [place] [address]   Lactate dehydrogenase     Status: None   Collection Time: 12/07/19  8:12 AM  Result Value Ref Range   LDH 109 98 - 192 U/L    Comment: Performed at [facility], [address] [place], [place] [address]  Comprehensive metabolic panel     Status: Abnormal   Collection Time: 12/07/19  8:12 AM  Result Value Ref Range   Sodium 139 135 - 145 mmol/L   Potassium 5.2 (H) 3.5 - 5.1 mmol/L   Chloride 103 98 - 111 mmol/L   CO2 25 22 - 32 mmol/L   Glucose, Bld 166 (H) 70 - 99 mg/dL    Comment: Glucose reference range applies only to samples taken after fasting for at least 8 hours.   BUN 27 (H) 8 - 23 mg/dL   Creatinine, Ser 1.12 0.61 - 1.24 mg/dL   Calcium 9.3 8.9 - 10.3 mg/dL   Total Protein 7.0 6.5 - 8.1 g/dL   Albumin 4.3 3.5 - 5.0 g/dL   AST 12 (L) 15 - 41 U/L   ALT 11 0 - 44 U/L   Alkaline Phosphatase 63 38 - 126 U/L   Total Bilirubin 0.4 0.3 - 1.2 mg/dL   GFR calc non Af Amer >60 >60 mL/min   GFR calc Af Amer >60 >60 mL/min   Anion gap 11 5 - 15    Comment: Performed at [facility], [address]  [place], [place] [address]  CBC with Differential     Status: Abnormal   Collection Time: 12/07/19  8:12 AM  Result Value Ref Range   WBC 7.3 4.0 - 10.5 K/uL   RBC 2.88 (L) 4.22 - 5.81 MIL/uL   Hemoglobin 9.6 (L) 13.0 - 17.0 g/dL   HCT 30.1 (L) 39 - 52 %   MCV 104.5 (H) 80.0 - 100.0 fL   MCH 33.3 26.0 - 34.0 pg   MCHC 31.9 30.0 - 36.0 g/dL   RDW 15.5 11.5 - 15.5 %   Platelets 210 150 - 400 K/uL   nRBC 0.0 0.0 - 0.2 %   Neutrophils Relative %  68 %   Neutro Abs 4.9 1.7 - 7.7 K/uL   Lymphocytes Relative 25 %   Lymphs Abs 1.8 0.7 - 4.0 K/uL   Monocytes Relative 6 %   Monocytes Absolute 0.4 0.1 - 1.0 K/uL   Eosinophils Relative 0 %   Eosinophils Absolute 0.0 0.0 - 0.5 K/uL   Basophils Relative 0 %   Basophils Absolute 0.0 0.0 - 0.1 K/uL   Immature Granulocytes 1 %   Abs Immature Granulocytes 0.04 0.00 - 0.07 K/uL    Comment: Performed at [facility], [address], [place], [place] [address]  Urine culture     Status: Abnormal   Collection Time: 12/07/19 11:20 AM   Specimen: Urine, Clean Catch  Result Value Ref Range   Specimen Description      URINE, CLEAN CATCH Performed at [facility], [address], [place], [place] [address]    Special Requests      NONE Performed at [facility], [address], [place], [place] [address]    Culture (A)     <10,000 COLONIES/mL INSIGNIFICANT GROWTH Performed at [facility], [place] [address], [place], [place] [address]    Report Status 12/08/2019 FINAL   Urinalysis, Complete w Microscopic     Status: Abnormal   Collection Time: 12/07/19 11:20 AM  Result Value Ref Range   Color, Urine YELLOW (A) YELLOW   APPearance CLEAR (A) CLEAR   Specific Gravity, Urine 1.020 1.005 - 1.030   pH 5.0 5.0 - 8.0   Glucose, UA NEGATIVE NEGATIVE mg/dL   Hgb urine dipstick NEGATIVE NEGATIVE   Bilirubin Urine NEGATIVE NEGATIVE   Ketones, ur NEGATIVE NEGATIVE mg/dL   Protein, ur NEGATIVE NEGATIVE mg/dL   Nitrite NEGATIVE  NEGATIVE   Leukocytes,Ua NEGATIVE NEGATIVE   RBC / HPF 0-5 0 - 5 RBC/hpf   WBC, UA 0-5 0 - 5 WBC/hpf   Bacteria, UA NONE SEEN NONE SEEN   Squamous Epithelial / LPF 0-5 0 - 5   Mucus PRESENT     Comment: Performed at [facility], [address] [place], [place] [address]  CBC with Differential     Status: Abnormal   Collection Time: 01/04/20  9:32 AM  Result Value Ref Range   WBC 10.3 4.0 - 10.5 K/uL   RBC 2.80 (L) 4.22 - 5.81 MIL/uL   Hemoglobin 9.4 (L) 13.0 - 17.0 g/dL   HCT 29.7 (L) 39 - 52 %   MCV 106.1 (H) 80.0 - 100.0 fL   MCH 33.6 26.0 - 34.0 pg   MCHC 31.6 30.0 - 36.0 g/dL   RDW 16.3 (H) 11.5 - 15.5 %   Platelets 193 150 - 400 K/uL   nRBC 0.0 0.0 - 0.2 %   Neutrophils Relative % 63 %   Neutro Abs 6.5 1.7 - 7.7 K/uL   Lymphocytes Relative 27 %   Lymphs Abs 2.8 0.7 - 4.0 K/uL   Monocytes Relative 9 %   Monocytes Absolute 0.9 0.1 - 1.0 K/uL   Eosinophils Relative 0 %   Eosinophils Absolute 0.0 0.0 - 0.5 K/uL   Basophils Relative 0 %   Basophils Absolute 0.0 0.0 - 0.1 K/uL   Immature Granulocytes 1 %   Abs Immature Granulocytes 0.08 (H) 0.00 - 0.07 K/uL    Comment: Performed at [facility], [address] [place], [place] [address]  Basic metabolic panel     Status: Abnormal   Collection Time: 01/04/20  9:32  AM  Result Value Ref Range   Sodium 139 135 - 145 mmol/L   Potassium 4.9 3.5 - 5.1 mmol/L   Chloride 102 98 - 111 mmol/L   CO2 26 22 - 32 mmol/L   Glucose, Bld 163 (H) 70 - 99 mg/dL    Comment: Glucose reference range applies only to samples taken after fasting for at least 8 hours.   BUN 36 (H) 8 - 23 mg/dL   Creatinine, Ser 1.16 0.61 - 1.24 mg/dL   Calcium 9.2 8.9 - 10.3 mg/dL   GFR calc non Af Amer >60 >60 mL/min   GFR calc Af Amer >60 >60 mL/min   Anion gap 11 5 - 15    Comment: Performed at [facility], [address] [place], [place] [address]  Basic metabolic panel     Status: Abnormal   Collection Time: 01/24/20 11:56 AM    Result Value Ref Range   Sodium 138 135 - 145 mmol/L   Potassium 6.2 (H) 3.5 - 5.1 mmol/L   Chloride 102 98 - 111 mmol/L   CO2 26 22 - 32 mmol/L   Glucose, Bld 160 (H) 70 - 99 mg/dL    Comment: Glucose reference range applies only to samples taken after fasting for at least 8 hours.   BUN 37 (H) 8 - 23 mg/dL   Creatinine, Ser 1.47 (H) 0.61 - 1.24 mg/dL   Calcium 9.2 8.9 - 10.3 mg/dL   GFR calc non Af Amer 48 (L) >60 mL/min   GFR calc Af Amer 55 (L) >60 mL/min   Anion gap 10 5 - 15    Comment: Performed at [facility], [address] [place], [place] [address]  CBC with Differential     Status: Abnormal   Collection Time: 01/24/20 11:56 AM  Result Value Ref Range   WBC 5.7 4.0 - 10.5 K/uL   RBC 2.98 (L) 4.22 - 5.81 MIL/uL   Hemoglobin 10.1 (L) 13.0 - 17.0 g/dL   HCT 31.5 (L) 39 - 52 %   MCV 105.7 (H) 80.0 - 100.0 fL   MCH 33.9 26.0 - 34.0 pg   MCHC 32.1 30.0 - 36.0 g/dL   RDW 15.9 (H) 11.5 - 15.5 %   Platelets 180 150 - 400 K/uL   nRBC 0.0 0.0 - 0.2 %   Neutrophils Relative % 46 %   Neutro Abs 2.6 1.7 - 7.7 K/uL   Lymphocytes Relative 32 %   Lymphs Abs 1.8 0.7 - 4.0 K/uL   Monocytes Relative 19 %   Monocytes Absolute 1.1 (H) 0.1 - 1.0 K/uL   Eosinophils Relative 1 %   Eosinophils Absolute 0.1 0.0 - 0.5 K/uL   Basophils Relative 2 %   Basophils Absolute 0.1 0.0 - 0.1 K/uL   Immature Granulocytes 0 %   Abs Immature Granulocytes 0.01 0.00 - 0.07 K/uL    Comment: Performed at [facility], [address] [place], [place] [address]  Lactate dehydrogenase     Status: None   Collection Time: 02/01/20  9:40 AM  Result Value Ref Range   LDH 108 98 - 192 U/L    Comment: Performed at [facility], [address] [place], [place] [address]  Comprehensive metabolic panel     Status: Abnormal   Collection Time: 02/01/20  9:40 AM  Result Value Ref Range   Sodium 137 135 - 145 mmol/L   Potassium 5.5 (H) 3.5 - 5.1 mmol/L   Chloride 99  98 - 111 mmol/L   CO2  28 22 - 32 mmol/L   Glucose, Bld 145 (H) 70 - 99 mg/dL    Comment: Glucose reference range applies only to samples taken after fasting for at least 8 hours.   BUN 23 8 - 23 mg/dL   Creatinine, Ser 1.24 0.61 - 1.24 mg/dL   Calcium 9.4 8.9 - 10.3 mg/dL   Total Protein 7.3 6.5 - 8.1 g/dL   Albumin 4.7 3.5 - 5.0 g/dL   AST 14 (L) 15 - 41 U/L   ALT 15 0 - 44 U/L   Alkaline Phosphatase 58 38 - 126 U/L   Total Bilirubin 0.6 0.3 - 1.2 mg/dL   GFR calc non Af Amer 59 (L) >60 mL/min   Anion gap 10 5 - 15    Comment: Performed at [facility], [address] [place], [place] [address]  CBC with Differential/Platelet     Status: Abnormal   Collection Time: 02/01/20  9:40 AM  Result Value Ref Range   WBC 5.7 4.0 - 10.5 K/uL   RBC 2.85 (L) 4.22 - 5.81 MIL/uL   Hemoglobin 9.6 (L) 13.0 - 17.0 g/dL   HCT 30.4 (L) 39 - 52 %   MCV 106.7 (H) 80.0 - 100.0 fL   MCH 33.7 26.0 - 34.0 pg   MCHC 31.6 30.0 - 36.0 g/dL   RDW 15.5 11.5 - 15.5 %   Platelets 167 150 - 400 K/uL   nRBC 0.0 0.0 - 0.2 %   Neutrophils Relative % 43 %   Neutro Abs 2.4 1.7 - 7.7 K/uL   Lymphocytes Relative 42 %   Lymphs Abs 2.4 0.7 - 4.0 K/uL   Monocytes Relative 10 %   Monocytes Absolute 0.6 0.1 - 1.0 K/uL   Eosinophils Relative 3 %   Eosinophils Absolute 0.1 0.0 - 0.5 K/uL   Basophils Relative 2 %   Basophils Absolute 0.1 0.0 - 0.1 K/uL   Immature Granulocytes 0 %   Abs Immature Granulocytes 0.01 0.00 - 0.07 K/uL    Comment: Performed at [facility], [address] [place], [place] [address]  Hold Tube- Blood Bank     Status: None   Collection Time: 02/01/20  9:44 AM  Result Value Ref Range   Blood Bank Specimen SAMPLE AVAILABLE FOR TESTING    Sample Expiration      02/04/2020,2359 Performed at [facility], [address] [place], [place] [address]   Urinalysis, Routine w reflex microscopic     Status: Abnormal   Collection Time: 02/02/20  8:59 AM  Result Value Ref Range   Color, Urine  YELLOW (A) YELLOW   APPearance CLEAR (A) CLEAR   Specific Gravity, Urine 1.019 1.005 - 1.030   pH 5.0 5.0 - 8.0   Glucose, UA NEGATIVE NEGATIVE mg/dL   Hgb urine dipstick NEGATIVE NEGATIVE   Bilirubin Urine NEGATIVE NEGATIVE   Ketones, ur NEGATIVE NEGATIVE mg/dL   Protein, ur NEGATIVE NEGATIVE mg/dL   Nitrite NEGATIVE NEGATIVE   Leukocytes,Ua NEGATIVE NEGATIVE    Comment: Performed at [facility], [address] [place], [place] [address]  APTT     Status: None   Collection Time: 02/02/20 10:21 AM  Result Value Ref Range   aPTT 27 24 - 36 seconds    Comment: Performed at [facility], [address] [place], [place] [address]  Basic metabolic panel     Status: Abnormal   Collection Time: 02/02/20  10:21 AM  Result Value Ref Range   Sodium 139 135 - 145 mmol/L   Potassium 4.7 3.5 - 5.1 mmol/L   Chloride 101 98 - 111 mmol/L   CO2 28 22 - 32 mmol/L   Glucose, Bld 139 (H) 70 - 99 mg/dL    Comment: Glucose reference range applies only to samples taken after fasting for at least 8 hours.   BUN 28 (H) 8 - 23 mg/dL   Creatinine, Ser 1.29 (H) 0.61 - 1.24 mg/dL   Calcium 9.5 8.9 - 10.3 mg/dL   GFR calc non Af Amer 56 (L) >60 mL/min   Anion gap 10 5 - 15    Comment: Performed at [facility], [address] [name], [place] [address]  CBC     Status: Abnormal   Collection Time: 02/02/20 10:21 AM  Result Value Ref Range   WBC 5.6 4.0 - 10.5 K/uL   RBC 2.90 (L) 4.22 - 5.81 MIL/uL   Hemoglobin 9.7 (L) 13.0 - 17.0 g/dL   HCT 31.3 (L) 39 - 52 %   MCV 107.9 (H) 80.0 - 100.0 fL   MCH 33.4 26.0 - 34.0 pg   MCHC 31.0 30.0 - 36.0 g/dL   RDW 15.5 11.5 - 15.5 %   Platelets 164 150 - 400 K/uL   nRBC 0.0 0.0 - 0.2 %    Comment: Performed at [facility], [address] [place], [place] [address]  Protime-INR     Status: None   Collection Time: 02/02/20 10:21 AM  Result Value Ref Range   Prothrombin Time 13.0 11.4 - 15.2 seconds   INR 1.0 0.8 - 1.2      Comment: (NOTE) INR goal varies based on device and disease states. Performed at [facility], [address], [place], [place] [address]   Surgical pcr screen     Status: Abnormal   Collection Time: 02/02/20 10:21 AM   Specimen: Nasal Mucosa; Nasal Swab  Result Value Ref Range   MRSA, PCR NEGATIVE NEGATIVE   Staphylococcus aureus POSITIVE (A) NEGATIVE    Comment: (NOTE) The Xpert SA Assay (FDA approved for NASAL specimens in patients 80 years of age and older), is one component of a comprehensive surveillance program. It is not intended to diagnose infection nor to guide or monitor treatment. Performed at [facility], [address] [place], [place] [address]   Type and screen     Status: None   Collection Time: 02/02/20 10:21 AM  Result Value Ref Range   ABO/RH(D) A POS    Antibody Screen NEG    Sample Expiration 02/16/2020,2359    Extend sample reason      NO TRANSFUSIONS OR PREGNANCY IN THE PAST 3 MONTHS Performed at [facility], [place], [place] [address]   SARS CORONAVIRUS 2 (TAT 6-24 HRS) Nasopharyngeal Nasopharyngeal Swab     Status: None   Collection Time: 02/03/20 11:27 AM   Specimen: Nasopharyngeal Swab  Result Value Ref Range   SARS Coronavirus 2 NEGATIVE NEGATIVE    Comment: (NOTE) SARS-CoV-2 target nucleic acids are NOT DETECTED.  The SARS-CoV-2 RNA is generally detectable in upper and lower respiratory specimens during the acute phase of infection. Negative results do not preclude SARS-CoV-2 infection, do not rule out co-infections with other pathogens, and should not be used as the sole basis for treatment or other patient management decisions. Negative results must be combined with clinical observations, patient history, and epidemiological information.  The expected result is Negative.  Fact Sheet for Patients: SugarRoll.be  Fact Sheet for Healthcare  Providers: https://www.woods-mathews.com/  This test is not yet approved or cleared by the [place] FDA and  has been authorized for detection and/or diagnosis of SARS-CoV-2 by FDA under an Emergency Use Authorization (EUA). This EUA will remain  in effect (meaning this test can be used) for the duration of the COVID-19 declaration under Se ction 564(b)(1) of the Act, 21 U.S.C. section 360bbb-3(b)(1), unless the authorization is terminated or revoked sooner.  Performed at [facility], [place] [address], [place], [place] [address]   Glucose, capillary     Status: Abnormal   Collection Time: 02/07/20  6:17 AM  Result Value Ref Range   Glucose-Capillary 133 (H) 70 - 99 mg/dL    Comment: Glucose reference range applies only to samples taken after fasting for at least 8 hours.  Glucose, capillary     Status: Abnormal   Collection Time: 02/07/20  9:37 AM  Result Value Ref Range   Glucose-Capillary 188 (H) 70 - 99 mg/dL    Comment: Glucose reference range applies only to samples taken after fasting for at least 8 hours.  CBC with Differential     Status: Abnormal   Collection Time: 02/15/20  9:02 AM  Result Value Ref Range   WBC 6.5 4.0 - 10.5 K/uL   RBC 2.84 (L) 4.22 - 5.81 MIL/uL   Hemoglobin 9.9 (L) 13.0 - 17.0 g/dL   HCT 30.5 (L) 39 - 52 %   MCV 107.4 (H) 80.0 - 100.0 fL   MCH 34.9 (H) 26.0 - 34.0 pg   MCHC 32.5 30.0 - 36.0 g/dL   RDW 15.4 11.5 - 15.5 %   Platelets 152 150 - 400 K/uL   nRBC 0.0 0.0 - 0.2 %   Neutrophils Relative % 52 %   Neutro Abs 3.3 1.7 - 7.7 K/uL   Lymphocytes Relative 33 %   Lymphs Abs 2.2 0.7 - 4.0 K/uL   Monocytes Relative 13 %   Monocytes Absolute 0.9 0.1 - 1.0 K/uL   Eosinophils Relative 1 %   Eosinophils Absolute 0.1 0.0 - 0.5 K/uL   Basophils Relative 1 %   Basophils Absolute 0.1 0.0 - 0.1 K/uL   Immature Granulocytes 0 %   Abs Immature Granulocytes 0.02 0.00 - 0.07 K/uL    Comment: Performed at [facility], [address], [place],  [address]    Radiology DG Lumbar Spine 2-3 Views  Result Date: 02/07/2020 CLINICAL DATA:  Back surgery.  Intrathecal pain pump placement. EXAM: LUMBAR SPINE - 2-3 VIEW COMPARISON:  Lumbar spine radiographs 06/16/2019 FINDINGS: AP and lateral C-arm images were obtained. On the lateral view, there is placement needle overlying the mid lumbar spinal canal. Accurate determination of level is not possible on these images. There is a catheter extending into the thoracic spinal canal. IMPRESSION: Spinal localization images. Placement of catheter in the thoracic spinal canal with the tip not visualized Electronically Signed   By: Franchot Gallo M.D.   On: 02/07/2020 09:15   MR LUMBAR SPINE WO CONTRAST  Result Date: 02/07/2020 CLINICAL DATA:  Chronic low back pain. EXAM: MRI LUMBAR SPINE WITHOUT CONTRAST TECHNIQUE: Multiplanar, multisequence MR imaging of the lumbar spine was performed. No intravenous contrast was administered. COMPARISON:  Lumbar spine radiographs 06/16/2019 FINDINGS: Segmentation:  Standard. Alignment:  Normal. Vertebrae: No fracture, suspicious osseous lesion, or significant marrow edema. Conus medullaris and cauda equina: Conus  extends to the L1-2 level. Conus and cauda equina appear normal. Paraspinal and other soft tissues: Partially imaged T2 hyperintense lesions in both kidneys compatible with cysts in measuring up to at least 5.5 cm on the left. Aortic atherosclerosis. Disc levels: T12-L1 and L1-2: Negative. L2-3: Mild disc bulging, a small right subarticular disc extrusion with slight superior migration, and mild facet hypertrophy result in borderline spinal stenosis and borderline right lateral recess stenosis without neural foraminal stenosis. L3-4: Normal disc.  Mild facet hypertrophy without stenosis. L4-5: Mild disc bulging greater to the left and moderate facet hypertrophy result in mild left lateral recess stenosis without spinal or neural foraminal  stenosis. L5-S1: Mild disc bulging, a shallow central disc protrusion with annular fissure, and mild facet hypertrophy result in mild right greater than left lateral recess stenosis and borderline to mild right neural foraminal stenosis without spinal stenosis. IMPRESSION: 1. Mild lumbar spondylosis and up to moderate facet hypertrophy without high-grade stenosis. 2. Small right subarticular disc extrusion at L2-3 without definite neural impingement. 3. Mild lateral recess stenosis at L4-5 and L5-S1. Electronically Signed   By: Logan Bores M.D.   On: 02/07/2020 06:32   CT CHEST ABDOMEN PELVIS W CONTRAST  Result Date: 01/25/2020 CLINICAL DATA:  Acute on chronic abdominal pain, CLL EXAM: CT CHEST, ABDOMEN, AND PELVIS WITH CONTRAST TECHNIQUE: Multidetector CT imaging of the chest, abdomen and pelvis was performed following the standard protocol during bolus administration of intravenous contrast. CONTRAST:  141mL OMNIPAQUE IOHEXOL 300 MG/ML SOLN, additional oral enteric contrast COMPARISON:  PET-CT, 07/22/2019, CT chest abdomen pelvis, 06/18/2019 FINDINGS: CT CHEST FINDINGS Cardiovascular: Aortic atherosclerosis. Normal heart size. Three-vessel coronary artery calcifications. No pericardial effusion. Mediastinum/Nodes: Slight interval decrease in size of enlarged mediastinal lymph nodes, an index pretracheal node measuring 2.6 x 1.2 cm, previously 2.9 x 1.4 cm (series 2, image 31). Thyroid gland, trachea, and esophagus demonstrate no significant findings. Lungs/Pleura: There are new clustered, tiny centrilobular nodules of the medial right upper lobe (series 4, image 97). No pleural effusion or pneumothorax. Musculoskeletal: No chest wall mass or suspicious bone lesions identified. CT ABDOMEN PELVIS FINDINGS Hepatobiliary: No focal liver abnormality is seen. Status post cholecystectomy. No biliary dilatation. Pancreas: Unremarkable. No pancreatic ductal dilatation or surrounding inflammatory changes. Spleen:  Normal in size without significant abnormality. Adrenals/Urinary Tract: Adrenal glands are unremarkable. Bilateral renal cysts. Kidneys are otherwise normal, without renal calculi, solid lesion, or hydronephrosis. Bladder is unremarkable. Stomach/Bowel: Stomach is within normal limits. Appendix appears normal. No evidence of bowel wall thickening, distention, or inflammatory changes. Large burden of stool throughout the colon rectum. Vascular/Lymphatic: Severe aortic atherosclerosis. Bilateral common iliac artery stents. Numerous enlarged retroperitoneal lymph nodes are not significantly changed, largest index left retroperitoneal node or conglomerate measuring 3.8 x 2.7 cm (series 2, image 83). Reproductive: Prostatomegaly with median lobe hypertrophy. Other: No abdominal wall hernia or abnormality. No abdominopelvic ascites. Musculoskeletal: No acute or significant osseous findings. IMPRESSION: 1. Slight interval decrease in size of enlarged mediastinal lymph nodes. 2. Numerous enlarged retroperitoneal lymph nodes are not significantly changed. 3. Constellation of findings is consistent with stable to slightly improved CLL. 4. No splenomegaly. 5. New clustered, tiny centrilobular nodules of the medial right upper lobe, consistent with nonspecific atypical infection or inflammation. 6. Prostatomegaly with median lobe hypertrophy. 7. Coronary artery disease.  Aortic Atherosclerosis (ICD10-I70.0). Electronically Signed   By: Eddie Candle M.D.   On: 01/25/2020 09:24   DG C-Arm 1-60 Min  Result Date: 02/07/2020 CLINICAL DATA:  Intrathecal  pain pump placement. EXAM: DG C-ARM 1-60 MIN FINDINGS: Refer to lumbar spine images dictated separately. IMPRESSION: Refer to lumbar spine is dictated separately. Electronically Signed   By: Franchot Gallo M.D.   On: 02/07/2020 09:22    Assessment/Plan Essential hypertension blood pressure control important in reducing the progression of atherosclerotic disease. On  appropriate oral medications.   Type 2 diabetes mellitus with complication (HCC) blood glucose control important in reducing the progression of atherosclerotic disease. Also, involved in wound healing. On appropriate medications.  Hyperlipidemia lipid control important in reducing the progression of atherosclerotic disease. Continue statin therapy  Malignant lymphoma, small lymphocytic ([place]) Follows with oncology.  Atherosclerotic peripheral vascular disease with intermittent claudication (HCC) His ABIs today have dropped some on the right and are now 0.75 with strong monophasic waveforms.  His digital pressure was 75 on the right as well.  His left ABI is in the normal range at 0.96 with multiphasic waveforms and a normal digital pressure.  His symptoms are not currently all that severe, and he is dealing with many other issues that are more pressing.  No role for intervention at this time, but we will keep a 67-month follow-up interval and he is instructed if his symptoms worsen to contact our office to consider intervention on the right leg.    Leotis Pain, MD  02/22/2020 3:13 PM    This note was created with Dragon medical transcription system.  Any errors from dictation are purely unintentional

## 2020-02-22 NOTE — Assessment & Plan Note (Signed)
His ABIs today have dropped some on the right and are now 0.75 with strong monophasic waveforms.  His digital pressure was 75 on the right as well.  His left ABI is in the normal range at 0.96 with multiphasic waveforms and a normal digital pressure.  His symptoms are not currently all that severe, and he is dealing with many other issues that are more pressing.  No role for intervention at this time, but we will keep a 47-month follow-up interval and he is instructed if his symptoms worsen to contact our office to consider intervention on the right leg.

## 2020-02-23 ENCOUNTER — Other Ambulatory Visit: Payer: Self-pay | Admitting: Pain Medicine

## 2020-02-23 ENCOUNTER — Telehealth: Payer: Self-pay | Admitting: Pain Medicine

## 2020-02-23 DIAGNOSIS — G893 Neoplasm related pain (acute) (chronic): Secondary | ICD-10-CM

## 2020-02-23 DIAGNOSIS — G8929 Other chronic pain: Secondary | ICD-10-CM

## 2020-02-23 DIAGNOSIS — G894 Chronic pain syndrome: Secondary | ICD-10-CM

## 2020-02-23 MED ORDER — PAIN MANAGEMENT IT PUMP REFILL: 1.0000 | Freq: Once | INTRATHECAL | 0 refills | Status: AC

## 2020-02-23 MED ORDER — OXYCODONE HCL 10 MG PO TABS: 10.0000 mg | ORAL_TABLET | ORAL | 0 refills | Status: DC | PRN

## 2020-02-23 NOTE — Progress Notes (Signed)
The patient is coming in to have an intrathecal pump filled on March 02, 2020.  He recently had his intrathecal pump put in and currently there is sterile water in the pump.  Today we received a phone call from the patient indicating that he has ran out of the medicine given to him by the surgeon.  In reviewing his medications, he is definitely not narcotic nave.  He was given a prescription for oxycodone IR 10 mg (# 60 pills) to be taken up to 1 tablet p.o. every 4 hours.  This prescription should have lasted him 10 days.  He called indicating that he had taken all the medicine by the eighth day.  In looking at the patient's PMP, clearly we need to have a conversation with him.  He has got multiple prescribers and he has been getting quite a bit of pain medicine.  10/13/2019 morphine ER 15 mg #30 to be taken one twice daily 10/18/2019 oxycodone/APAP 5/325 #90 to be taken one every 6 hours (5d) 11/09/2019 oxycodone/APAP 5/325 #90  11/09/2019 morphine ER 15 mg #30 11/22/2019 morphine ER 15 mg #60 12/10/2019 oxycodone/APAP #90 12/20/2019 morphine ER 15 mg #60 01/10/2020 oxycodone/APAP #90 01/17/2020 morphine ER 15 mg #60 01/24/2020 oxycodone 5 mg #90 02/04/2020 oxycodone/APAP #90 02/13/2020 oxycodone 5 #20 02/13/2020 morphine ER 30 mg #30 02/16/2020 oxycodone 10 mg #60  In view of this, today I have decided to change the medication that we will be placing in the pump to be preservative-free fentanyl 2000 mcg/mL + preservative-free bupivacaine 20 mg/mL.

## 2020-02-23 NOTE — Telephone Encounter (Signed)
Pump medication was increased and new prescription was sent to the pharmacy. He will need to wait until 03/02/20 for his pump to heal.

## 2020-02-23 NOTE — Telephone Encounter (Signed)
Sent bubble to Dr Dossie Arbour asking if he could send in a prescription for medication until he gets his pump filled on 03/02/20.

## 2020-02-23 NOTE — Telephone Encounter (Signed)
Patient is in a lot of pain and needs help managing it. He is scheduled next thurs the 4th for pump med fill.

## 2020-02-24 NOTE — Telephone Encounter (Signed)
Called to notify patient that prescription was sent in  by Dr Lowella Dandy

## 2020-02-28 ENCOUNTER — Other Ambulatory Visit: Payer: Self-pay

## 2020-02-28 DIAGNOSIS — C9110 Chronic lymphocytic leukemia of B-cell type not having achieved remission: Secondary | ICD-10-CM

## 2020-02-29 ENCOUNTER — Encounter: Payer: Self-pay | Admitting: Internal Medicine

## 2020-02-29 ENCOUNTER — Inpatient Hospital Stay: Payer: Medicare Other

## 2020-02-29 ENCOUNTER — Inpatient Hospital Stay: Payer: Medicare Other | Attending: Internal Medicine

## 2020-02-29 ENCOUNTER — Inpatient Hospital Stay (HOSPITAL_BASED_OUTPATIENT_CLINIC_OR_DEPARTMENT_OTHER): Payer: Medicare Other | Admitting: Internal Medicine

## 2020-02-29 ENCOUNTER — Other Ambulatory Visit: Payer: Self-pay

## 2020-02-29 DIAGNOSIS — D631 Anemia in chronic kidney disease: Secondary | ICD-10-CM

## 2020-02-29 DIAGNOSIS — N189 Chronic kidney disease, unspecified: Secondary | ICD-10-CM | POA: Diagnosis not present

## 2020-02-29 DIAGNOSIS — C9110 Chronic lymphocytic leukemia of B-cell type not having achieved remission: Secondary | ICD-10-CM | POA: Insufficient documentation

## 2020-02-29 DIAGNOSIS — I70219 Atherosclerosis of native arteries of extremities with intermittent claudication, unspecified extremity: Secondary | ICD-10-CM

## 2020-02-29 DIAGNOSIS — N183 Chronic kidney disease, stage 3 unspecified: Secondary | ICD-10-CM

## 2020-02-29 DIAGNOSIS — Z451 Encounter for adjustment and management of infusion pump: Secondary | ICD-10-CM | POA: Insufficient documentation

## 2020-02-29 LAB — SAMPLE TO BLOOD BANK

## 2020-02-29 LAB — BASIC METABOLIC PANEL
Anion gap: 11 (ref 5–15)
BUN: 19 mg/dL (ref 8–23)
CO2: 28 mmol/L (ref 22–32)
Calcium: 9.5 mg/dL (ref 8.9–10.3)
Chloride: 102 mmol/L (ref 98–111)
Creatinine, Ser: 1.18 mg/dL (ref 0.61–1.24)
GFR, Estimated: >60 mL/min (ref >60)
Glucose, Bld: 151 mg/dL — ABNORMAL HIGH (ref 70–99)
Potassium: 4.6 mmol/L (ref 3.5–5.1)
Sodium: 141 mmol/L (ref 135–145)

## 2020-02-29 LAB — CBC WITH DIFFERENTIAL/PLATELET
Abs Immature Granulocytes: 0 10*3/uL (ref 0.00–0.07)
Basophils Absolute: 0.1 10*3/uL (ref 0.0–0.1)
Basophils Relative: 2 %
Eosinophils Absolute: 0.1 10*3/uL (ref 0.0–0.5)
Eosinophils Relative: 2 %
HCT: 28.9 % — ABNORMAL LOW (ref 39.0–52.0)
Hemoglobin: 9.2 g/dL — ABNORMAL LOW (ref 13.0–17.0)
Immature Granulocytes: 0 %
Lymphocytes Relative: 43 %
Lymphs Abs: 2 10*3/uL (ref 0.7–4.0)
MCH: 34.2 pg — ABNORMAL HIGH (ref 26.0–34.0)
MCHC: 31.8 g/dL (ref 30.0–36.0)
MCV: 107.4 fL — ABNORMAL HIGH (ref 80.0–100.0)
Monocytes Absolute: 0.5 10*3/uL (ref 0.1–1.0)
Monocytes Relative: 12 %
Neutro Abs: 1.8 10*3/uL (ref 1.7–7.7)
Neutrophils Relative %: 41 %
Platelets: 155 10*3/uL (ref 150–400)
RBC: 2.69 MIL/uL — ABNORMAL LOW (ref 4.22–5.81)
RDW: 15.1 % (ref 11.5–15.5)
WBC: 4.5 10*3/uL (ref 4.0–10.5)
nRBC: 0 % (ref 0.0–0.2)

## 2020-02-29 LAB — LACTATE DEHYDROGENASE: LDH: 102 U/L (ref 98–192)

## 2020-02-29 MED ORDER — DARBEPOETIN ALFA 300 MCG/0.6ML IJ SOSY
300.0000 ug | PREFILLED_SYRINGE | Freq: Once | INTRAMUSCULAR | Status: AC
  Administered 2020-02-29: 300 ug via SUBCUTANEOUS
  Filled 2020-02-29: qty 0.6

## 2020-02-29 MED ORDER — MORPHINE SULFATE ER 30 MG PO TBCR: 30.0000 mg | EXTENDED_RELEASE_TABLET | Freq: Two times a day (BID) | ORAL | 0 refills | Status: DC

## 2020-02-29 NOTE — Assessment & Plan Note (Addendum)
#   CLL/SLL- relapsed most recently s/p Gazyva x6 cycles; OCT 2021-improved mediastinal adenopathy ~2.5cm; ~3cm retroperitoneal adenopathy-there is no splenomegaly.  Continue surveillance; STABLE.   #Chronic abdominal pain-unclear etiology-worsening [urine porphyrins were slightly abnormal ; plasma fractionated porphyrin- Normal]- s/p pain pump; awaiting initiation of pain medication.  continue MS Contin 30 mg twice daily; also continue oxycodone as prescribed.  Recommend reaching out to pain clinic ASAP.  #Hyperkalemia-4.6 today- /CKD-III-secondary renal insufficiency/creatinine 1.3- [baseline 1.4; Dr.Lateef]; STABLE;   continue LoKelma as per Nephrology.   # Anemia-hemoglobin 9.2 ;Secondary to CKD versus progressive leukemia -stable proceed with Aranesp today.   # DISPOSITION:  # Aranesp today # in 2 weeks- H&H- aranesp # 1 month- MD; labs- cbc/cmpLDH;iron studies; ferritin HOLD tube;possible aranesp;- Dr.B

## 2020-02-29 NOTE — Progress Notes (Signed)
Should there cone [name] OFFICE PROGRESS NOTE  Patient Care Team: [name], MD as PCP - General (Internal Medicine) Cammie Sickle, MD as Consulting Physician (Hematology and Oncology) Borders, Kirt Boys, NP as Nurse Practitioner (Hospice and Palliative Medicine) Verlon Au, NP as Nurse Practitioner (Hematology and Oncology) Jacquelin Hawking, NP as Nurse Practitioner (Oncology) Lonell Face, NP as Nurse Practitioner (Neurosurgery) Milinda Pointer, MD as Consulting Physician (Pain Medicine)  Cancer Staging No matching staging information was found for the patient.   Oncology History Overview Note  # 2006- CLL STAGE IV; MAY 2011- WBC- 57K;Platelets-99;Hb-12/CT Bulky LN; BMBx- 80% Invol; del 11; START Benda-Ritux x4 [finished Sep 2011];   # July 2015-Progression; Sep 2015-START ibrutinib; CT scan DEC 2015- Improvement LN; Cont Ibrutinib $RemoveBefore'140mg'[id number]$ /d; NOV 2016 CT- 1-2CM LN [mild progression compared to Dec 2015];NOV 2016- FISH peripheral blood- NO MUTATIONS/CD-38 Positive; NOV 7th- CONT IBRUTINIB 2 pills/day; CT AUG 2017- STABLE;  DEC 6th PET- Mild RP LN/ Retrocrural LN  # OFF ibrutinib [? intol]- sep 2019- Jan 16th 2020; Re-start Ibrutinib; September 2020-stop ibrutinib [poor tolerance/worsening anemia]  # Jan 16th 2020- start aranesp; HOLD while on [place].   # MARCH 11th 2021[name]   # SEP 2021- prostate enlargement on CT scan- UA-NEG; PSA- WNL; s/p Dr.Wolff.    # DEC 2019- PAIN CONTRACT  # PALLIATIVE CARE- 06/21/2019-  # October 2019-bone marrow biopsy [worsening anemia]-question dyserythropoietic changes/small clone of CLL;  # [facility] HEM- NEG.  SA skin infection [Oct 2016] s/p clinda -------------------------------------------------------    DIAGNOSIS: CLL  STAGE:   IV      ;GOALS: palliative  CURRENT/MOST RECENT THERAPY : Gazyva [C]   CLL (chronic lymphocytic leukemia) ([place])  07/08/2019 -  Chemotherapy   The patient had  obinutuzumab (GAZYVA) 100 mg in sodium chloride 0.9 % 100 mL (0.9615 mg/mL) chemo infusion, 100 mg, Intravenous, Once, 6 of 6 cycles Administration: 100 mg (07/08/2019), 900 mg (07/09/2019), 1,000 mg (07/26/2019), 1,000 mg (08/16/2019), 1,000 mg (08/02/2019), 1,000 mg (09/13/2019), 1,000 mg (10/11/2019), 1,000 mg (11/09/2019), 1,000 mg (12/07/2019)  for chemotherapy treatment.      INTERVAL HISTORY:  James [name] 69 y.o.  male pleasant patient above CLL and chronic abdominal pain of unclear etiology; anemia- ckd/hyperkalemia currently James [name] is here for follow-up.  In the interim patient had a pain pump placed with [facility] neurosurgery.   Patient however has not filled his pain medication; he is awaiting evaluation with pain clinic next week.  Continues to complain abdominal pain.  Poor appetite.  Positive weight loss.   Review of Systems  Constitutional: Positive for malaise/fatigue. Negative for chills, diaphoresis and fever.  HENT: Negative for nosebleeds and sore throat.   Eyes: Negative for double vision.  Respiratory: Positive for shortness of breath. Negative for cough, hemoptysis, sputum production and wheezing.   Cardiovascular: Negative for chest pain, palpitations, orthopnea and leg swelling.  Gastrointestinal: Positive for abdominal pain, constipation and nausea. Negative for blood in stool, diarrhea, heartburn, melena and vomiting.  Genitourinary: Negative for dysuria, frequency and urgency.  Skin: Negative.  Negative for itching and rash.  Neurological: Negative for dizziness, tingling, focal weakness, weakness and headaches.  Endo/Heme/Allergies: Does not bruise/bleed easily.  Psychiatric/Behavioral: Negative for depression. The patient is not nervous/anxious and does not have insomnia.       PAST MEDICAL HISTORY :  Past Medical History:  Diagnosis Date  . CLL (chronic lymphocytic leukemia) ([place])   . Constipation   . Depression   .  Diabetes mellitus without complication  ([place])   . GERD (gastroesophageal reflux disease)   . Hematuria   . Hyperlipidemia   . Hypertension   . Therapeutic opioid induced constipation     PAST SURGICAL HISTORY :   Past Surgical History:  Procedure Laterality Date  . CATARACT EXTRACTION W/ INTRAOCULAR LENS IMPLANT Bilateral   . CHOLECYSTECTOMY  1983  . COLONOSCOPY WITH PROPOFOL N/A 10/27/2017   Procedure: COLONOSCOPY WITH PROPOFOL;  Surgeon: Manya Silvas, MD;  Location: [facility] ENDOSCOPY;  Service: Endoscopy;  Laterality: N/A;  . ESOPHAGOGASTRODUODENOSCOPY (EGD) WITH PROPOFOL N/A 11/20/2016   Procedure: ESOPHAGOGASTRODUODENOSCOPY (EGD) WITH PROPOFOL;  Surgeon: Manya Silvas, MD;  Location: [facility] ENDOSCOPY;  Service: Endoscopy;  Laterality: N/A;  . ESOPHAGOGASTRODUODENOSCOPY (EGD) WITH PROPOFOL N/A 10/27/2017   Procedure: ESOPHAGOGASTRODUODENOSCOPY (EGD) WITH PROPOFOL;  Surgeon: Manya Silvas, MD;  Location[facility] ENDOSCOPY;  Service: Endoscopy;  Laterality: N/A;  . INTRATHECAL PUMP IMPLANT Left 02/07/2020   Procedure: INTRATHECAL PUMP & CATHETER IMPLANT;  Surgeon: Deetta Perla, MD;  Location: [facility] ORS;  Service: Neurosurgery;  Laterality: Left;  . LOWER EXTREMITY ANGIOGRAPHY Right 05/19/2017   Procedure: LOWER EXTREMITY ANGIOGRAPHY;  Surgeon: Algernon Huxley, MD;  Location: [place] CV LAB;  Service: Cardiovascular;  Laterality: Right;  . LOWER EXTREMITY ANGIOGRAPHY Left 08/10/2018   Procedure: LOWER EXTREMITY ANGIOGRAPHY;  Surgeon: Algernon Huxley, MD;  Location: [place] CV LAB;  Service: Cardiovascular;  Laterality: Left;    FAMILY HISTORY :   Family History  Problem Relation Age of Onset  . Hypertension Sister     SOCIAL HISTORY:   Social History   Tobacco Use  . Smoking status: Current Every Day Smoker    Packs/day: 0.50    Years: 47.00    Pack years: 23.50    Types: Cigarettes  . Smokeless tobacco: Never Used  Vaping Use  . Vaping Use: Never used  Substance Use Topics  . Alcohol use: Yes     Alcohol/week: 1.0 standard drink    Types: 1 Glasses of wine per week    Comment:  almost none in last 6 months  . Drug use: No    ALLERGIES:  has No Known Allergies.  MEDICATIONS:  Current Outpatient Medications  Medication Sig Dispense Refill  . atorvastatin (LIPITOR) 10 MG tablet Take 1 tablet (10 mg total) by mouth daily. 30 tablet 11  . clopidogrel (PLAVIX) 75 MG tablet Take 75 mg by mouth daily. (Patient not taking: Reported on 03/06/2020)    . Ensure (ENSURE) Take 237 mLs by mouth 3 (three) times daily between meals.     . folic acid (FOLVITE) 1 MG tablet Take 1 tablet (1 mg total) by mouth daily. 90 tablet 1  . metFORMIN (GLUCOPHAGE) 1000 MG tablet Take 1,000 mg by mouth daily with breakfast.     . methocarbamol (ROBAXIN) 500 MG tablet Take 1 tablet (500 mg total) by mouth every 6 (six) hours as needed for muscle spasms. 80 tablet 0  . morphine (MS CONTIN) 30 MG 12 hr tablet Take 1 tablet (30 mg total) by mouth every 12 (twelve) hours. (Patient taking differently: Take 30 mg by mouth every 12 (twelve) hours. Patient reports that he is taking more than prescribed, usually every 2 - 3 hours.) 6 tablet 0  . naloxegol oxalate (MOVANTIK) 25 MG TABS tablet Take 1 tablet (25 mg total) by mouth daily. 30 tablet 0  . [START ON 05/18/2020] PAIN MANAGEMENT IT PUMP REFILL 1 each by Intrathecal route once  for 1 dose. Medication: 1st Opioid: PF-Fentanyl 2000 mcg/ml Local Anesthetic: PF-Bupivacaine 20 mg/ml Volume: 40 ml Appointment Date: 03/02/2020 (Patient taking differently: 1 each by Intrathecal route once. Medication: 1st Opioid: PF-Fentanyl 2000 mcg/ml Local Anesthetic: PF-Bupivacaine 20 mg/ml Volume: 40 ml 24 hour dose  Fentanyl 200.0 mcg/day  Bupivicaine 2.0 mg/day PTM) 1 each 0  . pantoprazole (PROTONIX) 40 MG tablet Take 40 mg by mouth at bedtime.    . sucralfate (CARAFATE) 1 g tablet Take 1 g by mouth 4 (four) times daily -  with meals and at bedtime.    Marland Kitchen acyclovir (ZOVIRAX) 400 MG  tablet One pill a day [to prevent shingles] (Patient not taking: Reported on 01/21/2020) 30 tablet 3  . Calcium Carb-Cholecalciferol (CALCIUM PLUS D3 ABSORBABLE) [phone] MG-UNIT CAPS Take 1 capsule by mouth 2 (two) times daily with a meal. (Patient not taking: Reported on 02/15/2020) 60 capsule 5  . Cholecalciferol (VITAMIN D3) 125 MCG (5000 UT) CAPS Take 1 capsule (5,000 Units total) by mouth daily with breakfast. Take along with calcium and magnesium. (Patient not taking: Reported on 02/15/2020) 90 capsule 1  . naloxone (NARCAN) 2 MG/2ML injection Inject 1 mL (1 mg total) into the muscle as needed for up to 2 doses (for opioid overdose). In case of emergency (overdose), inject into muscle of upper arm or leg and call 911. 2 mL 0  . Oxycodone HCl 10 MG TABS Take 1 tablet (10 mg total) by mouth every 4 (four) hours as needed for up to 10 days. Maximum of 6/day. 60 tablet 0  . tamsulosin (FLOMAX) 0.4 MG CAPS capsule Take 0.4 mg by mouth daily. (Patient not taking: Reported on 02/22/2020)     No current facility-administered medications for this visit.    PHYSICAL EXAMINATION: ECOG PERFORMANCE STATUS: 1 - Symptomatic but completely ambulatory  BP 117/84 (BP Location: Left Arm, Patient Position: Sitting, Cuff Size: Normal)   Pulse 99   Temp 98.4 F (36.9 C) (Tympanic)   Resp 16   Ht 5' 8"  (1.727 m)   Wt 158 lb (71.7 kg)   SpO2 100%   BMI 24.02 kg/m   Filed Weights   02/29/20 1006  Weight: 158 lb (71.7 kg)    Physical Exam Constitutional:      Comments: He is alone.  Appears pale.  HENT:     Head: Normocephalic and atraumatic.     Mouth/Throat:     Pharynx: No oropharyngeal exudate.  Eyes:     Pupils: Pupils are equal, round, and reactive to light.  Cardiovascular:     Rate and Rhythm: Normal rate and regular rhythm.  Pulmonary:     Effort: No respiratory distress.     Breath sounds: No wheezing.  Abdominal:     General: Bowel sounds are normal. There is no distension.      Palpations: Abdomen is soft. There is no mass.     Tenderness: There is no abdominal tenderness. There is no guarding or rebound.  Musculoskeletal:        General: No tenderness. Normal range of motion.     Cervical back: Normal range of motion and neck supple.  Skin:    General: Skin is warm.     Coloration: Skin is pale.     Comments: Multiple bruises noted in bilateral upper extremity.  Neurological:     Mental Status: He is alert and oriented to person, place, and time.  Psychiatric:        Mood and Affect: Affect  normal.       LABORATORY DATA:  I have reviewed the data as listed    Component Value Date/Time   NA 141 02/29/2020 0950   NA 142 05/03/2014 1139   K 4.6 02/29/2020 0950   K 4.7 05/03/2014 1139   CL 102 02/29/2020 0950   CL 110 (H) 05/03/2014 1139   CO2 28 02/29/2020 0950   CO2 24 05/03/2014 1139   GLUCOSE 151 (H) 02/29/2020 0950   GLUCOSE 98 05/03/2014 1139   BUN 19 02/29/2020 0950   BUN 20 (H) 05/03/2014 1139   CREATININE 1.18 02/29/2020 0950   CREATININE 1.22 08/09/2014 1122   CALCIUM 9.5 02/29/2020 0950   CALCIUM 8.6 05/03/2014 1139   PROT 7.3 02/01/2020 0940   PROT 7.5 05/03/2014 1139   ALBUMIN 4.7 02/01/2020 0940   ALBUMIN 4.1 05/03/2014 1139   AST 14 (L) 02/01/2020 0940   AST 15 05/03/2014 1139   ALT 15 02/01/2020 0940   ALT 26 05/03/2014 1139   ALKPHOS 58 02/01/2020 0940   ALKPHOS 94 05/03/2014 1139   BILITOT 0.6 02/01/2020 0940   BILITOT 0.5 05/03/2014 1139   GFRNONAA >60 02/29/2020 0950   GFRNONAA >60 08/09/2014 1122   GFRAA 55 (L) 01/24/2020 1156   GFRAA >60 08/09/2014 1122    No results found for: SPEP, UPEP  Lab Results  Component Value Date   WBC 4.5 02/29/2020   NEUTROABS 1.8 02/29/2020   HGB 9.2 (L) 02/29/2020   HCT 28.9 (L) 02/29/2020   MCV 107.4 (H) 02/29/2020   PLT 155 02/29/2020      Chemistry      Component Value Date/Time   NA 141 02/29/2020 0950   NA 142 05/03/2014 1139   K 4.6 02/29/2020 0950   K 4.7  05/03/2014 1139   CL 102 02/29/2020 0950   CL 110 (H) 05/03/2014 1139   CO2 28 02/29/2020 0950   CO2 24 05/03/2014 1139   BUN 19 02/29/2020 0950   BUN 20 (H) 05/03/2014 1139   CREATININE 1.18 02/29/2020 0950   CREATININE 1.22 08/09/2014 1122      Component Value Date/Time   CALCIUM 9.5 02/29/2020 0950   CALCIUM 8.6 05/03/2014 1139   ALKPHOS 58 02/01/2020 0940   ALKPHOS 94 05/03/2014 1139   AST 14 (L) 02/01/2020 0940   AST 15 05/03/2014 1139   ALT 15 02/01/2020 0940   ALT 26 05/03/2014 1139   BILITOT 0.6 02/01/2020 0940   BILITOT 0.5 05/03/2014 1139       RADIOGRAPHIC STUDIES: I have personally reviewed the radiological images as listed and agreed with the findings in the report. No results found.   ASSESSMENT & PLAN:  CLL (chronic lymphocytic leukemia) ([place]) # CLL/SLL- relapsed most recently s/p Gazyva x6 cycles; OCT 2021-improved mediastinal adenopathy ~2.5cm; ~3cm retroperitoneal adenopathy-there is no splenomegaly.  Continue surveillance; STABLE.   #Chronic abdominal pain-unclear etiology-worsening [urine porphyrins were slightly abnormal ; plasma fractionated porphyrin- Normal]- s/p pain pump; awaiting initiation of pain medication.  continue MS Contin 30 mg twice daily; also continue oxycodone as prescribed.  Recommend reaching out to pain clinic ASAP.  #Hyperkalemia-4.6 today- /CKD-III-secondary renal insufficiency/creatinine 1.3- [baseline 1.4; Dr.Lateef]; STABLE;   continue LoKelma as per Nephrology.   # Anemia-hemoglobin 9.2 ;Secondary to CKD versus progressive leukemia -stable proceed with Aranesp today.   # DISPOSITION:  # Aranesp today # in 2 weeks- H&H- aranesp # 1 month- MD; labs- cbc/cmpLDH;iron studies; ferritin HOLD tube;possible aranesp;- Dr.B    No orders  of the defined types were placed in this encounter.  All questions were answered. The patient knows to call the clinic with any problems, questions or concerns.      Cammie Sickle,  MD 03/06/2020 4:12 PM

## 2020-02-29 NOTE — Progress Notes (Signed)
PROVIDER NOTE: Information contained herein reflects review and annotations entered in association with encounter. Interpretation of such information and data should be left to medically-trained personnel. Information provided to patient can be located elsewhere in the medical record under "Patient Instructions". Document created using STT-dictation technology, any transcriptional errors that may result from process are unintentional.    Patient: James [name]  Service Category: Procedure  Provider: Gaspar Cola, MD  DOB: 1950-05-31  DOS: 03/02/2020  Location: [facility]  MRN: [id number]  Setting: Ambulatory - outpatient  Referring Provider: [name], MD  Type: Established Patient  Specialty: Interventional Pain Management  PCP: [name], MD   Primary Reason for Visit: Interventional Pain Management Treatment. CC: Abdominal Pain (midline UQ)  Procedure:          Intrathecal Drug Delivery System (IDDS):  Type: Reservoir Refill [phone]) Today we will be removing the sterile water from the pump and introducing the fentanyl/bupivacaine combination.  We will prime the catheter and then provide the patient with a bolus of 25 mcg of fentanyl and 0.25 mg of bupivacaine. Region: Abdominal Laterality: Left  Type of Pump: Medtronic Synchromed II Delivery Route: Intrathecal Type of Pain Treated: Neuropathic/Nociceptive Primary Medication Class: Opioid/opiate  Medication, Concentration, Infusion Program, & Delivery Rate: Please see scanned programming printout.   Indications: 1. Chronic pain syndrome   2. Chronic abdominal pain   3. Cancer-related pain   4. CLL (chronic lymphocytic leukemia) ([place])   5. DDD (degenerative disc disease), lumbosacral   6. Generalized abdominal pain   7. Malignant lymphoma, small lymphocytic ([place])   8. Presence of implanted infusion pump   9. Presence of intrathecal pump   10. Pharmacologic therapy   11. Encounter for adjustment  and management of infusion pump    Pain Assessment: Self-Reported Pain Score: 5 /10             Reported level is compatible with observation.        According to the patient's PMP there has been a significant spike in the patient's use of opioid analgesics over the past 90 days.  According to the PMP summary his 90-day average MME is 252.64.  I believe this to be an inaccurate calculation as it takes into account the order for the intrathecal fentanyl pump.  However if we correct for what the patient has actually consumed over the last 114 days, we have that his true average is 77.89 MME/day.  During the patient's intrathecal trial he received a bolus of 25 mcg, which he was able to tolerate without any problems at all.  Based on the above information, it would be safe to start the patient had a simple continuous infusion rate of 50 g/day, which would come out to be an intrathecal rate of approximately 2 mcg/h.  The patient's daily MME has been difficult to calculate due to the fact that he has been receiving opioid analgesics from multiple providers including: Cammie Sickle, MD; Verlon Au, NP; Jacquelin Hawking, NP; Geoffry Paradise, NP; Kirt Boys Borders, NP; and [name], MD  Pharmacotherapy Assessment  Analgesic: Oxycodone IR 47m, 1 tab PO q 4 hrs (60 mg/day of oxycodone IR) (90 MME) + morphine ER 30 mg twice daily (60 mg/day of morphine ER) (60 MME) (150 MME/day). Highest recorded MME/day: 150 mg/day 90 day average MME/day: 77.89 mg/day   Monitoring: [place] PMP: PDMP reviewed during this encounter.       Pharmacotherapy: No side-effects or adverse  reactions reported. Compliance: Noncompliant with the prescribed medications.  He has not been following the instructions on how to take the medication, he has been taking it as he feels he needs it, with complete this regard to his own safety.  Today we had a long conversation about this.  The patient is now well aware that we will  not tolerate that. Effectiveness: Difficult to evaluate due to the patient's lack of keeping an appropriate schedule with the medication. Plan: Refer to "POC".  UDS: No results found for: SUMMARY  Intrathecal Pump Therapy Assessment  Manufacturer: Medtronic Synchromed Type: Programmable Volume: 40 mL reservoir MRI compatibility: Yes   Drug content:  Primary Medication Class: Opioid Primary Medication: PF-Fentanyl (2000 mcg/mL)  Secondary Medication: PF-Bupivacaine (20 mg/mL)  Other Medication: None   Programming:  Type: Simple continuous. See pump readout for details.   Changes:  Medication Change: The pump is been filled for the first time with an analgesic.  The pump was recently implanted and we requested that it be filled with preservative-free water.  Today we will be introducing preservative-free fentanyl 2000 mcg/mL + preservative-free bupivacaine 20 mg/mL. Rate Change: With the fentanyl concentration of 2000 mcg/mL, the minimum rate that the pump can deliver is of approximately 90 g/day.  The recommended starting dose is between 50 and 100 mcg/day.  In this patient in particular, he is definitely not narcotic nave and on today's visit he indicates that he has been taking oxycodone IR 10 mg every 1 hour.  Reported side-effects or adverse reactions: This is the initial fill of the medication but the patient did not report any side effects with the 25 mcg test bolus use during the pump trial.  Effectiveness: Not applicable at this point. Clinically meaningful improvement in function (CMIF): Not applicable at this point.  This is the start of the therapy.  Plan: Today we will fill the pump for the first time with an opioid analgesic in combination with a local anesthetic.  The plan is to fill the pump today, give him a 25 mcg bolus, programmed the pump to deliver 100 mcg/day, then we will have the patient return on Monday for further adjustment.  Pre-op Assessment:  Mr. Cumming  is a 69 y.o. (year old), male patient, seen today for interventional treatment. He  has a past surgical history that includes Cholecystectomy (1983); Esophagogastroduodenoscopy (egd) with propofol (N/A, 11/20/2016); Cataract extraction w/ intraocular lens implant (Bilateral); Lower Extremity Angiography (Right, 05/19/2017); Esophagogastroduodenoscopy (egd) with propofol (N/A, 10/27/2017); Colonoscopy with propofol (N/A, 10/27/2017); Lower Extremity Angiography (Left, 08/10/2018); and Intrathecal pump implant (Left, 02/07/2020). Mr. Muehl has a current medication list which includes the following prescription(s): atorvastatin, clopidogrel, ensure, folic acid, metformin, methocarbamol, morphine, naloxegol oxalate, oxycodone hcl, [START ON 05/18/2020] PAIN MANAGEMENT IT PUMP REFILL, pantoprazole, sucralfate, acyclovir, calcium plus d3 absorbable, vitamin d3, naloxone, and tamsulosin. His primarily concern today is the Abdominal Pain (midline UQ)  Initial Vital Signs:  Pulse/HCG Rate: 97  Temp: 98.4 F (36.9 C) Resp: 16 BP: 132/79 SpO2: 100 %  BMI: Estimated body mass index is 24.02 kg/m as calculated from the following:   Height as of this encounter: 5' 8"  (1.727 m).   Weight as of this encounter: 158 lb (71.7 kg).  Risk Assessment: Allergies: Reviewed. He has No Known Allergies.  Allergy Precautions: None required Coagulopathies: Reviewed. None identified.  Blood-thinner therapy: None at this time Active Infection(s): Reviewed. None identified. Mr. Avakian is afebrile  Site Confirmation: Mr. Welles was asked to confirm  the procedure and laterality before marking the site Procedure checklist: Completed Consent: Before the procedure and under the influence of no sedative(s), amnesic(s), or anxiolytics, the patient was informed of the treatment options, risks and possible complications. To fulfill our ethical and legal obligations, as recommended by the American Medical Association's Code of Ethics, I have  informed the patient of my clinical impression; the nature and purpose of the treatment or procedure; the risks, benefits, and possible complications of the intervention; the alternatives, including doing nothing; the risk(s) and benefit(s) of the alternative treatment(s) or procedure(s); and the risk(s) and benefit(s) of doing nothing.  Mr. Pasquarello was provided with information about the general risks and possible complications associated with most interventional procedures. These include, but are not limited to: failure to achieve desired goals, infection, bleeding, organ or nerve damage, allergic reactions, paralysis, and/or death.  In addition, he was informed of those risks and possible complications associated to this particular procedure, which include, but are not limited to: damage to the implant; failure to decrease pain; local, systemic, or serious CNS infections, intraspinal abscess with possible cord compression and paralysis, or life-threatening such as meningitis; bleeding; organ damage; nerve injury or damage with subsequent sensory, motor, and/or autonomic system dysfunction, resulting in transient or permanent pain, numbness, and/or weakness of one or several areas of the body; allergic reactions, either minor or major life-threatening, such as anaphylactic or anaphylactoid reactions.  Furthermore, Mr. Dufrane was informed of those risks and complications associated with the medications. These include, but are not limited to: allergic reactions (i.e.: anaphylactic or anaphylactoid reactions); endorphine suppression; bradycardia and/or hypotension; water retention and/or peripheral vascular relaxation leading to lower extremity edema and possible stasis ulcers; respiratory depression and/or shortness of breath; decreased metabolic rate leading to weight gain; swelling or edema; medication-induced neural toxicity; particulate matter embolism and blood vessel occlusion with resultant organ, and/or  nervous system infarction; and/or intrathecal granuloma formation with possible spinal cord compression and permanent paralysis.  Before refilling the pump Mr. Pevehouse was informed that some of the medications used in the devise may not be FDA approved for such use and therefore it constitutes an off-label use of the medications.  Finally, he was informed that Medicine is not an exact science; therefore, there is also the possibility of unforeseen or unpredictable risks and/or possible complications that may result in a catastrophic outcome. The patient indicated having understood very clearly. We have given the patient no guarantees and we have made no promises. Enough time was given to the patient to ask questions, all of which were answered to the patient's satisfaction. Mr. Arko has indicated that he wanted to continue with the procedure. Attestation: I, the ordering provider, attest that I have discussed with the patient the benefits, risks, side-effects, alternatives, likelihood of achieving goals, and potential problems during recovery for the procedure that I have provided informed consent. Date  Time: 03/02/2020  1:11 PM  Pre-Procedure Preparation:  Monitoring: As per clinic protocol. Respiration, ETCO2, SpO2, BP, heart rate and rhythm monitor placed and checked for adequate function Safety Precautions: Patient was assessed for positional comfort and pressure points before starting the procedure. Time-out: I initiated and conducted the "Time-out" before starting the procedure, as per protocol. The patient was asked to participate by confirming the accuracy of the "Time Out" information. Verification of the correct person, site, and procedure were performed and confirmed by me, the nursing staff, and the patient. "Time-out" conducted as per Joint Commission's Universal Protocol (UP.01.01.01). Time: 1337  Description of Procedure:          Position: Supine Target Area: Central-port of  intrathecal pump. Approach: Anterior, 90 degree angle approach. Area Prepped: Entire Area around the pump implant. DuraPrep (Iodine Povacrylex [0.7% available iodine] and Isopropyl Alcohol, 74% w/w) Safety Precautions: Aspiration looking for blood return was conducted prior to all injections. At no point did we inject any substances, as a needle was being advanced. No attempts were made at seeking any paresthesias. Safe injection practices and needle disposal techniques used. Medications properly checked for expiration dates. SDV (single dose vial) medications used. Description of the Procedure: Protocol guidelines were followed. Two nurses trained to do implant refills were present during the entire procedure. The refill medication was checked by both healthcare providers as well as the patient. The patient was included in the "Time-out" to verify the medication. The patient was placed in position. The pump was identified. The area was prepped in the usual manner. The sterile template was positioned over the pump, making sure the side-port location matched that of the pump. Both, the pump and the template were held for stability. The needle provided in the Medtronic Kit was then introduced thru the center of the template and into the central port. The pump content was aspirated and discarded volume documented. The new medication was slowly infused into the pump, thru the filter, making sure to avoid overpressure of the device. The needle was then removed and the area cleansed, making sure to leave some of the prepping solution back to take advantage of its long term bactericidal properties. The pump was interrogated and programmed to reflect the correct medication, volume, and dosage. The program was printed and taken to the physician for approval. Once checked and signed by the physician, a copy was provided to the patient and another scanned into the EMR. Vitals:   03/02/20 1309  BP: 132/79  Pulse: 97   Resp: 16  Temp: 98.4 F (36.9 C)  TempSrc: Temporal  SpO2: 100%  Weight: 158 lb (71.7 kg)  Height: 5\' 8"  (1.727 m)    Start Time: 1337 hrs. End Time: 1348 hrs. Materials & Medications: Medtronic Refill Kit Medication(s): Please see chart orders for details.  Imaging Guidance:          Type of Imaging Technique: None used Indication(s): N/A Exposure Time: No patient exposure Contrast: None used. Fluoroscopic Guidance: N/A Ultrasound Guidance: N/A Interpretation: N/A  Antibiotic Prophylaxis:   Anti-infectives (From admission, onward)   None     Indication(s): None identified  Post-operative Assessment:  Post-procedure Vital Signs:  Pulse/HCG Rate: 97  Temp: 98.4 F (36.9 C) Resp: 16 BP: 132/79 SpO2: 100 %  EBL: None  Complications: No immediate post-treatment complications observed by team, or reported by patient.  Note: The patient tolerated the entire procedure well. A repeat set of vitals were taken after the procedure and the patient was kept under observation following institutional policy, for this type of procedure. Post-procedural neurological assessment was performed, showing return to baseline, prior to discharge. The patient was provided with post-procedure discharge instructions, including a section on how to identify potential problems. Should any problems arise concerning this procedure, the patient was given instructions to immediately contact us, at any time, without hesitation. In any case, we plan to contact the patient by telephone for a follow-up status report regarding this interventional procedure.  Comments:  No additional relevant information.  Plan of Care  Orders:  Orders Placed This Encounter  Procedures  .  PUMP REFILL    Maintain Protocol by having two(2) healthcare providers during procedure and programming.    Scheduling Instructions:     Please refill intrathecal pump today.    Order Specific Question:   Where will this procedure  be performed?    Answer:   [facility]  . PUMP REFILL    Whenever possible schedule on a procedure today.    Standing Status:   Future    Standing Expiration Date:   07/28/2020    Scheduling Instructions:     Please schedule intrathecal pump refill based on pump programming. Avoid schedule intervals of more than 120 days (4 months).    Order Specific Question:   Where will this procedure be performed?    Answer:   [facility]  . PUMP REPROGRAM    Follow programming protocol by having two(2) healthcare providers present during programming.    Scheduling Instructions:     Please program the pump to the liver a simple continuous infusion of 50 mcg/day of PF-Fentanyl.    Order Specific Question:   Where will this procedure be performed?    Answer:   [facility]  . Informed Consent Details: Physician/Practitioner Attestation; Transcribe to consent form and obtain patient signature    Transcribe to consent form and obtain patient signature.    Order Specific Question:   Physician/Practitioner attestation of informed consent for procedure/surgical case    Answer:   I, the physician/practitioner, attest that I have discussed with the patient the benefits, risks, side effects, alternatives, likelihood of achieving goals and potential problems during recovery for the procedure that I have provided informed consent.    Order Specific Question:   Procedure    Answer:   Intrathecal pump refill    Order Specific Question:   Physician/Practitioner performing the procedure    Answer:   Attending Physician: Kathlen Brunswick. Dossie Arbour, MD & designated trained staff    Order Specific Question:   Indication/Reason    Answer:   Chronic Pain Syndrome (G89.4), presence of an intrathecal pump (Z97.8)  . Nursing Instructions:    1. Medication Agreement: Please go over agreement with the patient. Have the patient read and sign the agreement. Provide patient with a copy of the signed  agreement. 2. Make sure that the patient has completed the ORT (Opioid Risk Tool). 3. Provide the patient with a copy of our "Medicatiion Policy", "Medication Recommendations and Reminders", and "CBD information". 4. Remind the patient to always bring their medications and medication bottles (even if empty) to all appointments except for procedure appointments.    Scheduling Instructions:     Sign "Medication Agreement", complete "Opioid Risk Tool", inform patient of our practice "Medication Policies" (Pill counts, always bring bottles, except on procedure days).  . Nursing communication    Scheduling Instructions:     Complete/update the opioid risk tool (ORT) questionnaire.   Chronic Opioid Analgesic:  Oxycodone IR 76m, 1 tab PO q 4 hrs (60 mg/day of oxycodone IR) (90 MME) + morphine ER 30 mg twice daily (60 mg/day of morphine ER) (60 MME) (150 MME/day). Highest recorded MME/day: 150 mg/day 90 day average MME/day: 77.89 mg/day   Medications ordered for procedure: Meds ordered this encounter  Medications  . naloxone (NARCAN) 2 MG/2ML injection    Sig: Inject 1 mL (1 mg total) into the muscle as needed for up to 2 doses (for opioid overdose). In case of emergency (overdose), inject into muscle of upper arm or leg  and call 911.    Dispense:  2 mL    Refill:  0    Please instruct patient on the emergency use of this medication.  . Oxycodone HCl 10 MG TABS    Sig: Take 1 tablet (10 mg total) by mouth every 4 (four) hours as needed for up to 10 days. Maximum of 6/day.    Dispense:  60 tablet    Refill:  0   Medications administered: Jester F. Febus had no medications administered during this visit.  See the medical record for exact dosing, route, and time of administration.  Follow-up plan:   Return in 4 days (on 03/06/2020) for (F2F) for pump adjustment.       Interventional treatment options: Planned, scheduled, and/or pending:    NOTE: PLAVIX ANTICOAGULATION  (Stop: 7 days   Restart: 2 hrs)    Under consideration:   Diagnostic intrathecal injection of opioid analgesic (intrathecal pump trial).   Therapeutic/palliative (PRN):   Diagnostic bilateral celiac plexus block #3  (patient indicated no relief)    Recent Visits Date Type Provider Dept  12/21/19 Telemedicine Milinda Pointer, MD [facility]  12/14/19 Procedure visit Milinda Pointer, MD [facility]  Showing recent visits within past 90 days and meeting all other requirements Today's Visits Date Type Provider Dept  03/02/20 Procedure visit Milinda Pointer, MD [facility]  Showing today's visits and meeting all other requirements Future Appointments Date Type Provider Dept  03/06/20 Appointment Milinda Pointer, MD [facility]  Showing future appointments within next 90 days and meeting all other requirements  Disposition: Discharge home  Discharge (Date  Time): 03/02/2020;   hrs.   Primary Care Physician: [name], MD Location: [facility] Note by: Gaspar Cola, MD Date: 03/02/2020; Time: 2:58 PM  Disclaimer:  Medicine is not an Chief Strategy Officer. The only guarantee in medicine is that nothing is guaranteed. It is important to note that the decision to proceed with this intervention was based on the information collected from the patient. The Data and conclusions were drawn from the patient's questionnaire, the interview, and the physical examination. Because the information was provided in large part by the patient, it cannot be guaranteed that it has not been purposely or unconsciously manipulated. Every effort has been made to obtain as much relevant data as possible for this evaluation. It is important to note that the conclusions that lead to this procedure are derived in large part from the available data. Always take into account that the treatment will also be dependent on availability of resources and  existing treatment guidelines, considered by other Pain Management Practitioners as being common knowledge and practice, at the time of the intervention. For Medico-Legal purposes, it is also important to point out that variation in procedural techniques and pharmacological choices are the acceptable norm. The indications, contraindications, technique, and results of the above procedure should only be interpreted and judged by a Board-Certified Interventional Pain Specialist with extensive familiarity and expertise in the same exact procedure and technique.

## 2020-03-02 ENCOUNTER — Other Ambulatory Visit: Payer: Self-pay

## 2020-03-02 ENCOUNTER — Ambulatory Visit: Payer: Medicare Other | Attending: Pain Medicine | Admitting: Pain Medicine

## 2020-03-02 ENCOUNTER — Encounter: Payer: Self-pay | Admitting: Pain Medicine

## 2020-03-02 VITALS — BP 132/79 | HR 97 | Temp 98.4°F | Resp 16 | Ht 68.0 in | Wt 158.0 lb

## 2020-03-02 DIAGNOSIS — M5137 Other intervertebral disc degeneration, lumbosacral region: Secondary | ICD-10-CM

## 2020-03-02 DIAGNOSIS — C9110 Chronic lymphocytic leukemia of B-cell type not having achieved remission: Secondary | ICD-10-CM | POA: Diagnosis not present

## 2020-03-02 DIAGNOSIS — G8929 Other chronic pain: Secondary | ICD-10-CM

## 2020-03-02 DIAGNOSIS — R1084 Generalized abdominal pain: Secondary | ICD-10-CM | POA: Diagnosis not present

## 2020-03-02 DIAGNOSIS — Z0289 Encounter for other administrative examinations: Secondary | ICD-10-CM

## 2020-03-02 DIAGNOSIS — R109 Unspecified abdominal pain: Secondary | ICD-10-CM

## 2020-03-02 DIAGNOSIS — F1190 Opioid use, unspecified, uncomplicated: Secondary | ICD-10-CM | POA: Insufficient documentation

## 2020-03-02 DIAGNOSIS — Z7189 Other specified counseling: Secondary | ICD-10-CM | POA: Insufficient documentation

## 2020-03-02 DIAGNOSIS — Z978 Presence of other specified devices: Secondary | ICD-10-CM | POA: Insufficient documentation

## 2020-03-02 DIAGNOSIS — Z79899 Other long term (current) drug therapy: Secondary | ICD-10-CM | POA: Diagnosis not present

## 2020-03-02 DIAGNOSIS — G893 Neoplasm related pain (acute) (chronic): Secondary | ICD-10-CM | POA: Diagnosis not present

## 2020-03-02 DIAGNOSIS — G894 Chronic pain syndrome: Secondary | ICD-10-CM

## 2020-03-02 DIAGNOSIS — C8300 Small cell B-cell lymphoma, unspecified site: Secondary | ICD-10-CM

## 2020-03-02 DIAGNOSIS — Z9229 Personal history of other drug therapy: Secondary | ICD-10-CM

## 2020-03-02 DIAGNOSIS — Z451 Encounter for adjustment and management of infusion pump: Secondary | ICD-10-CM | POA: Insufficient documentation

## 2020-03-02 DIAGNOSIS — I70219 Atherosclerosis of native arteries of extremities with intermittent claudication, unspecified extremity: Secondary | ICD-10-CM

## 2020-03-02 DIAGNOSIS — Z95828 Presence of other vascular implants and grafts: Secondary | ICD-10-CM | POA: Insufficient documentation

## 2020-03-02 MED ORDER — NALOXONE HCL 2 MG/2ML IJ SOSY: 1.0000 mg | PREFILLED_SYRINGE | INTRAMUSCULAR | 0 refills | Status: DC | PRN

## 2020-03-02 MED ORDER — OXYCODONE HCL 10 MG PO TABS: 10.0000 mg | ORAL_TABLET | ORAL | 0 refills | Status: DC | PRN

## 2020-03-02 NOTE — Progress Notes (Signed)
Safety precautions to be maintained throughout the outpatient stay will include: orient to surroundings, keep bed in low position, maintain call bell within reach at all times, provide assistance with transfer out of bed and ambulation.  

## 2020-03-02 NOTE — Patient Instructions (Addendum)
____________________________________________________________________________________________  Medication Rules  Purpose: To inform patients, and their family members, of our rules and regulations.  Applies to: All patients receiving prescriptions (written or electronic).  Pharmacy of record: Pharmacy where electronic prescriptions will be sent. If written prescriptions are taken to a different pharmacy, please inform the nursing staff. The pharmacy listed in the electronic medical record should be the one where you would like electronic prescriptions to be sent.  Electronic prescriptions: In compliance with the [place] Streng[employer] Opioid Misuse Prevention (STOP) Act of 2017 (Session Law 2017-74/H243), effective April 29, 2018, all controlled substances must be electronically prescribed. Calling prescriptions to the pharmacy will cease to exist.  Prescription refills: Only during scheduled appointments. Applies to all prescriptions.  NOTE: The following applies primarily to controlled substances (Opioid* Pain Medications).   Type of encounter (visit): For patients receiving controlled substances, face-to-face visits are required. (Not an option or up to the patient.)  Patient's responsibilities: 1. Pain Pills: Bring all pain pills to every appointment (except for procedure appointments). 2. Pill Bottles: Bring pills in original pharmacy bottle. Always bring the newest bottle. Bring bottle, even if empty. 3. Medication refills: You are responsible for knowing and keeping track of what medications you take and those you need refilled. The day before your appointment: write a list of all prescriptions that need to be refilled. The day of the appointment: give the list to the admitting nurse. Prescriptions will be written only during appointments. No prescriptions will be written on procedure days. If you forget a medication: it will not be "Called in", "Faxed", or "electronically sent".  You will need to get another appointment to get these prescribed. No early refills. Do not call asking to have your prescription filled early. 4. Prescription Accuracy: You are responsible for carefully inspecting your prescriptions before leaving our office. Have the discharge nurse carefully go over each prescription with you, before taking them home. Make sure that your name is accurately spelled, that your address is correct. Check the name and dose of your medication to make sure it is accurate. Check the number of pills, and the written instructions to make sure they are clear and accurate. Make sure that you are given enough medication to last until your next medication refill appointment. 5. Taking Medication: Take medication as prescribed. When it comes to controlled substances, taking less pills or less frequently than prescribed is permitted and encouraged. Never take more pills than instructed. Never take medication more frequently than prescribed.  6. Inform other Doctors: Always inform, all of your healthcare providers, of all the medications you take. 7. Pain Medication from other Providers: You are not allowed to accept any additional pain medication from any other Doctor or Healthcare provider. There are two exceptions to this rule. (see below) In the event that you require additional pain medication, you are responsible for notifying us, as stated below. 8. Medication Agreement: You are responsible for carefully reading and following our Medication Agreement. This must be signed before receiving any prescriptions from our practice. Safely store a copy of your signed Agreement. Violations to the Agreement will result in no further prescriptions. (Additional copies of our Medication Agreement are available upon request.) 9. Laws, Rules, & Regulations: All patients are expected to follow all Federal and State Laws, Statutes, Rules, & Regulations. Ignorance of the Laws does not constitute a  valid excuse.  10. Illegal drugs and Controlled Substances: The use of illegal substances (including, but not limited to marijuana and its   derivatives) and/or the illegal use of any controlled substances is strictly prohibited. Violation of this rule may result in the immediate and permanent discontinuation of any and all prescriptions being written by our practice. The use of any illegal substances is prohibited. 11. Adopted CDC guidelines & recommendations: Target dosing levels will be at or below 60 MME/day. Use of benzodiazepines** is not recommended.  Exceptions: There are only two exceptions to the rule of not receiving pain medications from other Healthcare Providers. 1. Exception #1 (Emergencies): In the event of an emergency (i.e.: accident requiring emergency care), you are allowed to receive additional pain medication. However, you are responsible for: As soon as you are able, call our office [phone], at any time of the day or night, and leave a message stating your name, the date and nature of the emergency, and the name and dose of the medication prescribed. In the event that your call is answered by a member of our staff, make sure to document and save the date, time, and the name of the person that took your information.  2. Exception #2 (Planned Surgery): In the event that you are scheduled by another doctor or dentist to have any type of surgery or procedure, you are allowed (for a period no longer than 30 days), to receive additional pain medication, for the acute post-op pain. However, in this case, you are responsible for picking up a copy of our "Post-op Pain Management for Surgeons" handout, and giving it to your surgeon or dentist. This document is available at our office, and does not require an appointment to obtain it. Simply go to our office during business hours (Monday-Thursday from 8:00 AM to 4:00 PM) (Friday 8:00 AM to 12:00 Noon) or if you have a scheduled appointment  with us, prior to your surgery, and ask for it by name. In addition, you are responsible for: calling our office [phone], at any time of the day or night, and leaving a message stating your name, name of your surgeon, type of surgery, and date of procedure or surgery. Failure to comply with your responsibilities may result in termination of therapy involving the controlled substances.  *Opioid medications include: morphine, codeine, oxycodone, oxymorphone, hydrocodone, hydromorphone, meperidine, tramadol, tapentadol, buprenorphine, fentanyl, methadone. **Benzodiazepine medications include: diazepam (Valium), alprazolam (Xanax), clonazepam (Klonopine), lorazepam (Ativan), clorazepate (Tranxene), chlordiazepoxide (Librium), estazolam (Prosom), oxazepam (Serax), temazepam (Restoril), triazolam (Halcion) (Last updated: 01/04/2020) ____________________________________________________________________________________________   ____________________________________________________________________________________________  Medication Recommendations and Reminders  Applies to: All patients receiving prescriptions (written and/or electronic).  Medication Rules & Regulations: These rules and regulations exist for your safety and that of others. They are not flexible and neither are we. Dismissing or ignoring them will be considered "non-compliance" with medication therapy, resulting in complete and irreversible termination of such therapy. (See document titled "Medication Rules" for more details.) In all conscience, because of safety reasons, we cannot continue providing a therapy where the patient does not follow instructions.  Pharmacy of record:   Definition: This is the pharmacy where your electronic prescriptions will be sent.   We do not endorse any particular pharmacy, however, we have experienced problems with [facility] not securing enough medication supply for the community.  We do not  restrict you in your choice of pharmacy. However, once we write for your prescriptions, we will NOT be re-sending more prescriptions to fix restricted supply problems created by your pharmacy, or your insurance.   The pharmacy listed in the electronic medical record should be the   one where you want electronic prescriptions to be sent.  If you choose to change pharmacy, simply notify our nursing staff.  Recommendations:  Keep all of your pain medications in a safe place, under lock and key, even if you live alone. We will NOT replace lost, stolen, or damaged medication.  After you fill your prescription, take 1 week's worth of pills and put them away in a safe place. You should keep a separate, properly labeled bottle for this purpose. The remainder should be kept in the original bottle. Use this as your primary supply, until it runs out. Once it's gone, then you know that you have 1 week's worth of medicine, and it is time to come in for a prescription refill. If you do this correctly, it is unlikely that you will ever run out of medicine.  To make sure that the above recommendation works, it is very important that you make sure your medication refill appointments are scheduled at least 1 week before you run out of medicine. To do this in an effective manner, make sure that you do not leave the office without scheduling your next medication management appointment. Always ask the nursing staff to show you in your prescription , when your medication will be running out. Then arrange for the receptionist to get you a return appointment, at least 7 days before you run out of medicine. Do not wait until you have 1 or 2 pills left, to come in. This is very poor planning and does not take into consideration that we may need to cancel appointments due to bad weather, sickness, or emergencies affecting our staff.  DO NOT ACCEPT A "Partial Fill": If for any reason your pharmacy does not have enough pills/tablets  to completely fill or refill your prescription, do not allow for a "partial fill". The law allows the pharmacy to complete that prescription within 72 hours, without requiring a new prescription. If they do not fill the rest of your prescription within those 72 hours, you will need a separate prescription to fill the remaining amount, which we will NOT provide. If the reason for the partial fill is your insurance, you will need to talk to the pharmacist about payment alternatives for the remaining tablets, but again, DO NOT ACCEPT A PARTIAL FILL, unless you can trust your pharmacist to obtain the remainder of the pills within 72 hours.  Prescription refills and/or changes in medication(s):   Prescription refills, and/or changes in dose or medication, will be conducted only during scheduled medication management appointments. (Applies to both, written and electronic prescriptions.)  No refills on procedure days. No medication will be changed or started on procedure days. No changes, adjustments, and/or refills will be conducted on a procedure day. Doing so will interfere with the diagnostic portion of the procedure.  No phone refills. No medications will be "called into the pharmacy".  No Fax refills.  No weekend refills.  No Holliday refills.  No after hours refills.  Remember:  Business hours are:  Monday to Thursday 8:00 AM to 4:00 PM Provider's Schedule: Milinda Pointer, MD - Appointments are:  Medication management: Monday and Wednesday 8:00 AM to 4:00 PM Procedure day: Tuesday and Thursday 7:30 AM to 4:00 PM Gillis Santa, MD - Appointments are:  Medication management: Tuesday and Thursday 8:00 AM to 4:00 PM Procedure day: Monday and Wednesday 7:30 AM to 4:00 PM (Last update: 11/17/2019) ____________________________________________________________________________________________   ____________________________________________________________________________________________  CBD  (cannabidiol) WARNING  Applicable to: All individuals currently  taking or considering taking CBD (cannabidiol) and, more important, all patients taking opioid analgesic controlled substances (pain medication). (Example: oxycodone; oxymorphone; hydrocodone; hydromorphone; morphine; methadone; tramadol; tapentadol; fentanyl; buprenorphine; butorphanol; dextromethorphan; meperidine; codeine; etc.)  Legal status: CBD remains a Schedule I drug prohibited for any use. CBD is illegal with one exception. In the [place], CBD has a limited Transport planner (FDA) approval for the treatment of two specific types of epilepsy disorders. Only one CBD product has been approved by the FDA for this purpose: "Epidiolex". FDA is aware that some companies are marketing products containing cannabis and cannabis-derived compounds in ways that violate the [employer], Drug and Cosmetic Act [facility] Act) and that may put the health and safety of consumers at risk. The FDA, a Federal agency, has not enforced the CBD status since 2018.   Legality: Some manufacturers ship CBD products nationally, which is illegal. Often such products are sold online and are therefore available throughout the country. CBD is openly sold in head shops and health food stores in some states where such sales have not been explicitly legalized. Selling unapproved products with unsubstantiated therapeutic claims is not only a violation of the law, but also can put patients at risk, as these products have not been proven to be safe or effective. Federal illegality makes it difficult to conduct research on CBD.  Reference: "FDA Regulation of Cannabis and Cannabis-Derived Products, Including Cannabidiol (CBD)" - [employer]  Warning: CBD is not FDA approved and has not undergo the same manufacturing controls as prescription  drugs.  This means that the purity and safety of available CBD may be questionable. Most of the time, despite manufacturer's claims, it is contaminated with THC (delta-9-tetrahydrocannabinol - the chemical in marijuana responsible for the "HIGH").  When this is the case, the [facility] contaminant will trigger a positive urine drug screen (UDS) test for Marijuana (carboxy-THC). Because a positive UDS for any illicit substance is a violation of our medication agreement, your opioid analgesics (pain medicine) may be permanently discontinued.  MORE ABOUT CBD  General Information: CBD  is a derivative of the Marijuana (cannabis sativa) plant discovered in 37. It is one of the 113 identified substances found in Marijuana. It accounts for up to 40% of the plant's extract. As of 2018, preliminary clinical studies on CBD included research for the treatment of anxiety, movement disorders, and pain. CBD is available and consumed in multiple forms, including inhalation of smoke or vapor, as an aerosol spray, and by mouth. It may be supplied as an oil containing CBD, capsules, dried cannabis, or as a liquid solution. CBD is thought not to be as psychoactive as THC (delta-9-tetrahydrocannabinol - the chemical in marijuana responsible for the "HIGH"). Studies suggest that CBD may interact with different biological target receptors in the body, including cannabinoid and other neurotransmitter receptors. As of 2018 the mechanism of action for its biological effects has not been determined.  Side-effects  Adverse reactions: Dry mouth, diarrhea, decreased appetite, fatigue, drowsiness, malaise, weakness, sleep disturbances, and others.  Drug interactions: CBC may interact with other medications such as blood-thinners. (Last update:  12/04/2019) ____________________________________________________________________________________________   ____________________________________________________________________________________________  Drug Holidays (Slow)  What is a "Drug Holiday"? Drug Holiday: is the name given to the period of time during which a patient stops taking a medication(s) for the purpose of eliminating tolerance to the drug.  Benefits . Improved effectiveness of opioids. . Decreased opioid dose needed to achieve benefits. . Improved pain with lesser dose.  What is tolerance? Tolerance: is the progressive decreased in effectiveness of a drug due to its repetitive use. With repetitive use, the body gets use to the medication and as a consequence, it loses its effectiveness. This is a common problem seen with opioid pain medications. As a result, a larger dose of the drug is needed to achieve the same effect that used to be obtained with a smaller dose.  How long should a "Drug Holiday" last? You should stay off of the pain medicine for at least 14 consecutive days. (2 weeks)  Should I stop the medicine "cold [place]"? No. You should always coordinate with your Pain Specialist so that he/she can provide you with the correct medication dose to make the transition as smoothly as possible.  How do I stop the medicine? Slowly. You will be instructed to decrease the daily amount of pills that you take by one (1) pill every seven (7) days. This is called a "slow downward taper" of your dose. For example: if you normally take four (4) pills per day, you will be asked to drop this dose to three (3) pills per day for seven (7) days, then to two (2) pills per day for seven (7) days, then to one (1) per day for seven (7) days, and at the end of those last seven (7) days, this is when the "Drug Holiday" would start.   Will I have withdrawals? By doing a "slow downward taper" like this one, it is unlikely that you will  experience any significant withdrawal symptoms. Typically, what triggers withdrawals is the sudden stop of a high dose opioid therapy. Withdrawals can usually be avoided by slowly decreasing the dose over a prolonged period of time. If you do not follow these instructions and decide to stop your medication abruptly, withdrawals may be possible.  What are withdrawals? Withdrawals: refers to the wide range of symptoms that occur after stopping or dramatically reducing opiate drugs after heavy and prolonged use. Withdrawal symptoms do not occur to patients that use low dose opioids, or those who take the medication sporadically. Contrary to benzodiazepine (example: Valium, Xanax, etc.) or alcohol withdrawals ("Delirium Tremens"), opioid withdrawals are not lethal. Withdrawals are the physical manifestation of the body getting rid of the excess receptors.  Expected Symptoms Early symptoms of withdrawal may include: . Agitation . Anxiety . Muscle aches . Increased tearing . Insomnia . Runny nose . Sweating . Yawning  Late symptoms of withdrawal may include: . Abdominal cramping . Diarrhea . Dilated pupils . Goose bumps . Nausea . Vomiting  Will I experience withdrawals? Due to the slow nature of the taper, it is very unlikely that you will experience any.  What is a slow taper? Taper: refers to the gradual decrease in dose.  (Last update: 11/17/2019) ____________________________________________________________________________________________    ____________________________________________________________________________________________  Virtual Visits   Eligibility In order to be eligible for "Virtual Visit Encounters", you must be available over the phone. It is the patient's responsibility to make sure we have a way to contact you.  We understand how people are reluctant to pickup on "unknown" calls, therefore, we suggest adding our telephone numbers to your list of  "CONTACT(s)". This way, you should be able to readily identify our calls when you receive them.  When will this type of visits be used? The decision will be made on a case by case basis.  At what time will I be called? This is an excellent question. The providers will try to  call you during the day, whenever they have spare time. For the purpose of the day's schedule, you are assigned a time for your appointment, however, the call may come anytime during the day. We need you to be available on a moment's notice. If you are unable to do this, then request an "in-person" appointment rather than a "virtual visit".  Can I request my medication visits to be "Virtual"? Yes you may request it, but the decision is entirely up to the healthcare provider. Control substances require specific monitoring that requires Face-to-Face encounters. The number of encounters  and the extent of the monitoring is determined on a case by case basis.  Add a new contact to your smart phone and label it "PAIN CLINIC" Under this contact add the following numbers: Main: [phone] Nurses: [phone] Dr. Dossie Arbour: [phone] or [phone]  ____________________________________________________________________________________________    Opioid Overdose Opioids are drugs that are often used to treat pain. Opioids include illegal drugs, such as heroin, as well as prescription pain medicines, such as codeine, morphine, hydrocodone, oxycodone, and fentanyl. An opioid overdose happens when you take too much of an opioid. An overdose may be intentional or accidental and can happen with any type of opioid. The effects of an overdose can be mild, dangerous, or even deadly. Opioid overdose is a medical emergency. What are the causes? This condition may be caused by:  Taking too much of an opioid on purpose.  Taking too much of an opioid by accident.  Using two or more substances that contain opioids at the same  time.  Taking an opioid with a substance that affects your heart, breathing, or blood pressure. These include alcohol, tranquilizers, sleeping pills, illegal drugs, and some over-the-counter medicines. This condition may also happen due to an error made by:  A health care provider who prescribes a medicine.  The pharmacist who fills the prescription order. What increases the risk? This condition is more likely in:  Children. They may be attracted to colorful pills. Because of a child's small size, even a small amount of a drug can be dangerous.  Older people. They may be taking many different drugs. Older people may have difficulty reading labels or remembering when they last took their medicine. They may also be more sensitive to the effects of opioids.  People with chronic medical conditions, especially heart, liver, kidney, or neurological diseases.  People who take an opioid for a long period of time.  People who use: ? Illegal drugs. IV heroin is especially dangerous. ? Other substances, including alcohol, while using an opioid.  People who have: ? A history of drug or alcohol abuse. ? Certain mental health conditions. ? A history of previous drug overdoses.  People who take opioids that are not prescribed for them. What are the signs or symptoms? Symptoms of this condition depend on the type of opioid and the amount that was taken. Common symptoms include:  Sleepiness or difficulty waking from sleep.  Decrease in attention.  Confusion.  Slurred speech.  Slowed breathing and a slow pulse (bradycardia).  Nausea and vomiting.  Abnormally small pupils. Signs and symptoms that require emergency treatment include:  Cold, clammy, and pale skin.  Blue lips and fingernails.  Vomiting.  Gurgling sounds in the throat.  A pulse that is very slow or difficult to detect.  Breathing that is very irregular, slow, noisy, or difficult to detect.  Limp body.  Inability  to respond to speech or be awakened  from sleep (stupor).  Seizures. How is this diagnosed? This condition is diagnosed based on your symptoms and medical history. It is important to tell your health care provider:  About all of the opioids that you took.  When you took the opioids.  Whether you were drinking alcohol or using marijuana, cocaine, or other drugs. Your health care provider will do a physical exam. This exam may include:  Checking and monitoring your heart rate and rhythm, breathing rate, temperature, and blood pressure (vital signs).  Measuring oxygen levels in your blood.  Checking for abnormally small pupils. You may also have blood tests or urine tests. You may have X-rays if you are having severe breathing problems. How is this treated? This condition requires immediate medical treatment and hospitalization. Treatment is given in the hospital intensive care (ICU) setting. Supporting your vital signs and your breathing is the first step in treating an opioid overdose. Treatment may also include:  Giving salts and minerals (electrolytes) along with fluids through an IV.  Inserting a breathing tube (endotracheal tube) in your airway to help you breathe if you cannot breathe on your own or you are in danger of not being able to breathe on your own.  Giving oxygen through a small tube under your nose.  Passing a tube through your nose and into your stomach (nasogastric tube, or NG tube) to empty your stomach.  Giving medicines that: ? Increase your blood pressure. ? Relieve nausea and vomiting. ? Relieve abdominal pain and cramping. ? Reverse the effects of the opioid (naloxone).  Monitoring your heart and oxygen levels.  Ongoing counseling and mental health support if you intentionally overdosed or used an illegal drug. Follow these instructions at home:  Medicines  Take over-the-counter and prescription medicines only as told by your health care  provider.  Always ask your health care provider about possible side effects and interactions of any new medicine that you start taking.  Keep a list of all the medicines that you take, including over-the-counter medicines. Bring this list with you to all your medical visits. General instructions  Drink enough fluid to keep your urine pale yellow.  Keep all follow-up visits as told by your health care provider. This is important. How is this prevented?  Read the drug inserts that come with your opioid pain medicines.  Take medicines only as told by your health care provider. Do not take more medicine than you are told. Do not take medicines more frequently than you are told.  Do not drink alcohol or take sedatives when taking opioids.  Do not use illegal or recreational drugs, including cocaine, ecstasy, and marijuana.  Do not take opioid medicines that are not prescribed for you.  Store all medicines in safety containers that are out of the reach of children.  Get help if you are struggling with: ? Alcohol or drug use. ? Depression or another mental health problem. ? Thoughts of hurting yourself or another person.  Keep the phone number of your local poison control center near your phone or in your mobile phone. In the [place] the hotline of the [facility] is (606) 796-4699.  If you were prescribed naloxone, make sure you understand how to take it. Contact a health care provider if you:  Need help understanding how to take your pain medicines.  Feel your medicines are too strong.  Are concerned that your pain medicines are not working well for your pain.  Develop new symptoms or side  effects when you are taking medicines. Get help right away if:  You or someone else is having symptoms of an opioid overdose. Get help even if you are not sure.  You have serious thoughts about hurting yourself or others.  You have: ? Chest pain. ? Difficulty  breathing. ? A loss of consciousness. These symptoms may represent a serious problem that is an emergency. Do not wait to see if the symptoms will go away. Get medical help right away. Call your local emergency services (911 in the [place] Do not drive yourself to the hospital. If you ever feel like you may hurt yourself or others, or have thoughts about taking your own life, get help right away. You can go to your nearest emergency department or call:  Your local emergency services (911 in the [place]  A suicide crisis helpline, such as the [facility] at (336)879-0368. This is open 24 hours a day. Summary  Opioids are drugs that are often used to treat pain. Opioids include illegal drugs, such as heroin, as well as prescription pain medicines.  An opioid overdose happens when you take too much of an opioid.  Overdoses can be intentional or accidental.  Opioid overdose is very dangerous. It is a life-threatening emergency.  If you or someone you know is experiencing an opioid overdose, get help right away. This information is not intended to replace advice given to you by your health care provider. Make sure you discuss any questions you have with your health care provider. Document Revised: 04/02/2018 Document Reviewed: 04/02/2018 Elsevier Patient Education  [place].  Opioid Pain Medicine Management Opioids are powerful medicines that are used to treat moderate to severe pain. When used for short periods of time, they can help you:  Sleep better.  Do better in physical or occupational therapy.  Feel better in the first few days after an injury.  Recover from surgery. Opioids should be taken with the supervision of a trained health care provider. They should be taken for the shortest period of time possible. This is because opioids can be addictive, and the longer you take opioids, the greater your risk of addiction (opioid use disorder). What are  the risks? Using opioid pain medicines for longer than 3 days increases your risk of these side effects. Opioids can cause side effects, such as:  Constipation.  Nausea.  Vomiting.  Drowsiness.  Confusion.  Opioid use disorder.  Breathing difficulties (respiratory depression). Taking opioid pain medicine for a long period of time can affect your ability to do daily tasks. It also puts you at risk for:  Motor vehicle accidents.  Depression.  Suicide.  Heart attack.  Overdose, which can sometimes lead to death. What is a pain treatment plan? A pain treatment plan is an agreement between you and your health care provider. Pain is unique to each person, and treatments vary depending on your condition. To manage your pain successfully, you and your health care provider need to understand each other and work together. To help you do this:  Discuss the goals of your treatment, including how much pain you might expect to have and how you will manage the pain.  Review the risks and benefits of taking opioid medicines for your condition.  Remember that a good treatment plan uses more than one approach and minimizes the chance of side effects.  Be honest about the amount of medicines you take and about any drug or alcohol use.  Get pain  medicine prescriptions from only one health care provider. Pain can be managed with many types of alternative treatments. Ask your health care provider to refer you to one or more specialists who can help you manage pain through:  Physical or occupational therapy.  Counseling (cognitive behavioral therapy).  Good nutrition.  Biofeedback.  Massage.  Meditation.  Non-opioid medicine.  Following a gentle exercise program. Tapering your use of opioids If you have been taking opioid medicine for more than a few weeks, you may need to slowly decrease (taper) how much you take until you stop completely. Tapering your use of opioids can decrease  your chances of experiencing withdrawal symptoms, such as:  Pain and cramping in the abdomen.  Nausea.  Sweating.  Sleepiness.  Restlessness.  Uncontrollable shaking (tremors).  Cravings for the medicine. Do not attempt to taper your use of opioids on your own. Talk with your health care provider about how to do this. Your health care provider may prescribe a step-down schedule based on how much medicine you are taking and how long you have been taking it. Follow these instructions at home: Safety and storage   While you are taking opioid pain medicine: ? Do not drive. ? Do not use machinery or power tools. ? Do not sign legal documents. ? Do not drink alcohol. ? Do not take sleeping pills. ? Do not supervise children by yourself. ? Do not participate in activities that require climbing or being in high places. ? Do not enter a body of water, such as a lake, river, ocean, spa, or swimming pool.  Keep pain medicine in a locked cabinet or in a secure area where children cannot reach it.  Do not share your pain medicine with anyone. Getting rid of leftover pills  Do not save any leftover pills. Get rid of leftover pills safely by: ? Taking the medicine to a prescription take-back program. This is usually offered by the county or law enforcement. ? Bringing it to a pharmacy that has a drug disposal container. ? Throwing it out in the trash. Check the label or package insert of your medicine to see whether this is safe to do. If it is safe to throw it out, remove the medicine from the container and mix it with food or pet waste before putting it in the trash. Activity  Return to your normal activities as told by your health care provider. Ask your health care provider what activities are safe for you.  Avoid activities that make your pain worse.  Do exercises as told by your health care provider. General instructions  You may need to take these actions to prevent or treat  constipation: ? Drink enough fluid to keep your urine pale yellow. ? Take over-the-counter or prescription medicines. ? Eat foods that are high in fiber, such as beans, whole grains, and fresh fruits and vegetables. ? Limit foods that are high in fat and processed sugars, such as fried or sweet foods.  Keep all follow-up visits as told by your health care provider. This is important. Where to find support If you have been taking opioids for a long time, you may benefit from receiving support for quitting from a local support group or counselor. Ask your health care provider for a referral to these resources in your area. Where to find more information Centers for Disease Control and Prevention (CDC): http://www.wolf.info/ Get help right away if: Seek medical care right away if you are taking opioids and you, or  people close to you, notice any of the following:  Difficulty breathing.  Breathing that is slower or more shallow than normal.  A very slow heartbeat (pulse).  Severe confusion.  Unconsciousness.  Sleepiness.  Slurred speech.  Nausea and vomiting.  Cold, clammy skin.  Blue lips or fingernails.  Limpness.  Abnormally small pupils. If you think that you or someone else may have taken too much of an opioid medicine, get medical help right away. Call your local emergency services (911 in the [place] Do not drive yourself to the hospital. If you ever feel like you may hurt yourself or others, or have thoughts about taking your own life, get help right away. You can go to your nearest emergency department or call:  Your local emergency services (911 in the [place]  The hotline of the [facility] 7124537888 in the [place]  A suicide crisis helpline, such as the [facility] at 909 005 2770. This is open 24 hours a day. Summary  Opioid medicines can help you manage moderate to severe pain for a short period of time.  A pain  treatment plan is an agreement between you and your health care provider. Discuss the goals of your treatment, including how much pain you might expect to have and how you will manage the pain.  Pain can be managed with many types of alternative treatments.  If you think that you or someone else may have taken too much of an opioid, get medical help right away. This information is not intended to replace advice given to you by your health care provider. Make sure you discuss any questions you have with your health care provider. Document Revised: 05/15/2018 Document Reviewed: 05/15/2018 Elsevier Patient Education  [place].

## 2020-03-03 ENCOUNTER — Telehealth: Payer: Self-pay

## 2020-03-03 NOTE — Telephone Encounter (Signed)
Called PP. States he is filling better with the pump at times. Patient has appt on Monday for adjustment.Instructed to call if needed.

## 2020-03-06 ENCOUNTER — Ambulatory Visit: Payer: Medicare Other | Attending: Pain Medicine | Admitting: Pain Medicine

## 2020-03-06 ENCOUNTER — Encounter: Payer: Self-pay | Admitting: Pain Medicine

## 2020-03-06 ENCOUNTER — Other Ambulatory Visit: Payer: Self-pay

## 2020-03-06 VITALS — BP 144/82 | HR 100 | Temp 97.9°F | Ht 68.0 in | Wt 156.0 lb

## 2020-03-06 DIAGNOSIS — G893 Neoplasm related pain (acute) (chronic): Secondary | ICD-10-CM | POA: Diagnosis not present

## 2020-03-06 DIAGNOSIS — G894 Chronic pain syndrome: Secondary | ICD-10-CM | POA: Insufficient documentation

## 2020-03-06 DIAGNOSIS — R109 Unspecified abdominal pain: Secondary | ICD-10-CM | POA: Diagnosis not present

## 2020-03-06 DIAGNOSIS — Z95828 Presence of other vascular implants and grafts: Secondary | ICD-10-CM

## 2020-03-06 DIAGNOSIS — C9110 Chronic lymphocytic leukemia of B-cell type not having achieved remission: Secondary | ICD-10-CM

## 2020-03-06 DIAGNOSIS — Z978 Presence of other specified devices: Secondary | ICD-10-CM | POA: Diagnosis not present

## 2020-03-06 DIAGNOSIS — Z9229 Personal history of other drug therapy: Secondary | ICD-10-CM | POA: Insufficient documentation

## 2020-03-06 DIAGNOSIS — G8929 Other chronic pain: Secondary | ICD-10-CM

## 2020-03-06 DIAGNOSIS — M5137 Other intervertebral disc degeneration, lumbosacral region: Secondary | ICD-10-CM

## 2020-03-06 DIAGNOSIS — C8590 Non-Hodgkin lymphoma, unspecified, unspecified site: Secondary | ICD-10-CM | POA: Insufficient documentation

## 2020-03-06 DIAGNOSIS — R1084 Generalized abdominal pain: Secondary | ICD-10-CM | POA: Insufficient documentation

## 2020-03-06 DIAGNOSIS — Z79899 Other long term (current) drug therapy: Secondary | ICD-10-CM

## 2020-03-06 DIAGNOSIS — Z451 Encounter for adjustment and management of infusion pump: Secondary | ICD-10-CM | POA: Diagnosis not present

## 2020-03-06 DIAGNOSIS — I70219 Atherosclerosis of native arteries of extremities with intermittent claudication, unspecified extremity: Secondary | ICD-10-CM | POA: Diagnosis not present

## 2020-03-06 DIAGNOSIS — C8300 Small cell B-cell lymphoma, unspecified site: Secondary | ICD-10-CM

## 2020-03-06 NOTE — Patient Instructions (Signed)
Opioid Overdose Opioids are drugs that are often used to treat pain. Opioids include illegal drugs, such as heroin, as well as prescription pain medicines, such as codeine, morphine, hydrocodone, oxycodone, and fentanyl. An opioid overdose happens when you take too much of an opioid. An overdose may be intentional or accidental and can happen with any type of opioid. The effects of an overdose can be mild, dangerous, or even deadly. Opioid overdose is a medical emergency. What are the causes? This condition may be caused by:  Taking too much of an opioid on purpose.  Taking too much of an opioid by accident.  Using two or more substances that contain opioids at the same time.  Taking an opioid with a substance that affects your heart, breathing, or blood pressure. These include alcohol, tranquilizers, sleeping pills, illegal drugs, and some over-the-counter medicines. This condition may also happen due to an error made by:  A health care provider who prescribes a medicine.  The pharmacist who fills the prescription order. What increases the risk? This condition is more likely in:  Children. They may be attracted to colorful pills. Because of a child's small size, even a small amount of a drug can be dangerous.  Older people. They may be taking many different drugs. Older people may have difficulty reading labels or remembering when they last took their medicine. They may also be more sensitive to the effects of opioids.  People with chronic medical conditions, especially heart, liver, kidney, or neurological diseases.  People who take an opioid for a long period of time.  People who use: ? Illegal drugs. IV heroin is especially dangerous. ? Other substances, including alcohol, while using an opioid.  People who have: ? A history of drug or alcohol abuse. ? Certain mental health conditions. ? A history of previous drug overdoses.  People who take opioids that are not prescribed  for them. What are the signs or symptoms? Symptoms of this condition depend on the type of opioid and the amount that was taken. Common symptoms include:  Sleepiness or difficulty waking from sleep.  Decrease in attention.  Confusion.  Slurred speech.  Slowed breathing and a slow pulse (bradycardia).  Nausea and vomiting.  Abnormally small pupils. Signs and symptoms that require emergency treatment include:  Cold, clammy, and pale skin.  Blue lips and fingernails.  Vomiting.  Gurgling sounds in the throat.  A pulse that is very slow or difficult to detect.  Breathing that is very irregular, slow, noisy, or difficult to detect.  Limp body.  Inability to respond to speech or be awakened from sleep (stupor).  Seizures. How is this diagnosed? This condition is diagnosed based on your symptoms and medical history. It is important to tell your health care provider:  About all of the opioids that you took.  When you took the opioids.  Whether you were drinking alcohol or using marijuana, cocaine, or other drugs. Your health care provider will do a physical exam. This exam may include:  Checking and monitoring your heart rate and rhythm, breathing rate, temperature, and blood pressure (vital signs).  Measuring oxygen levels in your blood.  Checking for abnormally small pupils. You may also have blood tests or urine tests. You may have X-rays if you are having severe breathing problems. How is this treated? This condition requires immediate medical treatment and hospitalization. Treatment is given in the hospital intensive care (ICU) setting. Supporting your vital signs and your breathing is the first step in   treating an opioid overdose. Treatment may also include:  Giving salts and minerals (electrolytes) along with fluids through an IV.  Inserting a breathing tube (endotracheal tube) in your airway to help you breathe if you cannot breathe on your own or you are in  danger of not being able to breathe on your own.  Giving oxygen through a small tube under your nose.  Passing a tube through your nose and into your stomach (nasogastric tube, or NG tube) to empty your stomach.  Giving medicines that: ? Increase your blood pressure. ? Relieve nausea and vomiting. ? Relieve abdominal pain and cramping. ? Reverse the effects of the opioid (naloxone).  Monitoring your heart and oxygen levels.  Ongoing counseling and mental health support if you intentionally overdosed or used an illegal drug. Follow these instructions at home:  Medicines  Take over-the-counter and prescription medicines only as told by your health care provider.  Always ask your health care provider about possible side effects and interactions of any new medicine that you start taking.  Keep a list of all the medicines that you take, including over-the-counter medicines. Bring this list with you to all your medical visits. General instructions  Drink enough fluid to keep your urine pale yellow.  Keep all follow-up visits as told by your health care provider. This is important. How is this prevented?  Read the drug inserts that come with your opioid pain medicines.  Take medicines only as told by your health care provider. Do not take more medicine than you are told. Do not take medicines more frequently than you are told.  Do not drink alcohol or take sedatives when taking opioids.  Do not use illegal or recreational drugs, including cocaine, ecstasy, and marijuana.  Do not take opioid medicines that are not prescribed for you.  Store all medicines in safety containers that are out of the reach of children.  Get help if you are struggling with: ? Alcohol or drug use. ? Depression or another mental health problem. ? Thoughts of hurting yourself or another person.  Keep the phone number of your local poison control center near your phone or in your mobile phone. In the  [place] the hotline of the National Poison Control Center is (800) 222-1222.  If you were prescribed naloxone, make sure you understand how to take it. Contact a health care provider if you:  Need help understanding how to take your pain medicines.  Feel your medicines are too strong.  Are concerned that your pain medicines are not working well for your pain.  Develop new symptoms or side effects when you are taking medicines. Get help right away if:  You or someone else is having symptoms of an opioid overdose. Get help even if you are not sure.  You have serious thoughts about hurting yourself or others.  You have: ? Chest pain. ? Difficulty breathing. ? A loss of consciousness. These symptoms may represent a serious problem that is an emergency. Do not wait to see if the symptoms will go away. Get medical help right away. Call your local emergency services (911 in the [place] Do not drive yourself to the hospital. If you ever feel like you may hurt yourself or others, or have thoughts about taking your own life, get help right away. You can go to your nearest emergency department or call:  Your local emergency services (911 in the [place]  A suicide crisis helpline, such as the National Suicide Prevention Lifeline at   1-800-273-8255. This is open 24 hours a day. Summary  Opioids are drugs that are often used to treat pain. Opioids include illegal drugs, such as heroin, as well as prescription pain medicines.  An opioid overdose happens when you take too much of an opioid.  Overdoses can be intentional or accidental.  Opioid overdose is very dangerous. It is a life-threatening emergency.  If you or someone you know is experiencing an opioid overdose, get help right away. This information is not intended to replace advice given to you by your health care provider. Make sure you discuss any questions you have with your health care provider. Document Revised: 04/02/2018 Document  Reviewed: 04/02/2018 Elsevier Patient Education  2020 [employer]  

## 2020-03-06 NOTE — Progress Notes (Signed)
Safety precautions to be maintained throughout the outpatient stay will include: orient to surroundings, keep bed in low position, maintain call bell within reach at all times, provide assistance with transfer out of bed and ambulation.  Patient therapy manager set up with patient.  Instructed on use by [employer] rep.  Patient states understanding.  Pump increase done.

## 2020-03-06 NOTE — Progress Notes (Signed)
PROVIDER NOTE: Information contained herein reflects review and annotations entered in association with encounter. Interpretation of such information and data should be left to medically-trained personnel. Information provided to patient can be located elsewhere in the medical record under "Patient Instructions". Document created using STT-dictation technology, any transcriptional errors that may result from process are unintentional.    Patient: James [name]  Service Category: Procedure  Provider: Oswaldo Done, MD  DOB: Mar 19, 1951  DOS: 03/06/2020  Location: [facility]  MRN: [id number]  Setting: Ambulatory - outpatient  Referring Provider: Barbette Reichmann, MD  Type: Established Patient  Specialty: Interventional Pain Management  PCP: Barbette Reichmann, MD   Primary Reason for Visit: Interventional Pain Management Treatment. CC: Abdominal Pain  Procedure:          Intrathecal Drug Delivery System (IDDS):  Type: Analysis & Programming (06616)       Region: Abdominal Laterality: Left  Type of Pump: Medtronic Synchromed II Delivery Route: Intrathecal Type of Pain Treated: Neuropathic/Nociceptive Primary Medication Class: Opioid/opiate  Medication, Concentration, Infusion Program, & Delivery Rate: Please see scanned programming printout.   Indications: 1. Chronic pain syndrome   2. Chronic abdominal pain   3. Cancer-related pain   4. CLL (chronic lymphocytic leukemia) (HCC)   5. DDD (degenerative disc disease), lumbosacral   6. Generalized abdominal pain   7. Malignant lymphoma, small lymphocytic (HCC)   8. Presence of implanted infusion pump   9. Presence of intrathecal pump   10. Pharmacologic therapy    Pain Assessment: Self-Reported Pain Score: 5 /10             Reported level is compatible with observation.        The patient returns to the clinic today for follow-up evaluation after having the intrathecal pump filled and programmed for the first time  last Thursday.  Despite the fact that I had personally clearly warned him about his use of oral medications, he went ahead and took it in a manner other than prescribed and apparently took more medicine.  He tried to give me an excuse for this today, but I have warned him that this will not fly with [place].  Today we will increase the infusion on his intrathecal pump in order to better control this abdominal pain.  Today we will go from a simple continuous infusion of 100 mcg of fentanyl per day to an infusion rate of 200 mcg of fentanyl per day.  This will raise the amount of bupivacaine that he gets in 1day to 2 mg/day from the simple continuous infusion.  In addition to this, [employer] has donated a patient control device for his intrathecal pump which we have programmed as follows: PTM dose: 25 mcg bolus Duration of bolus: 1 minute Lockout interval: 6 hours Maximum 24-hour doses: 4 Assuming that he was to use the PCA mode to the fullest, he could considerably get a maximum of 300 mcg/day of fentanyl and close to 3 mg/day of bupivacaine.  The PTM dose was chosen based on the patient's initial pump trial where he received a bolus of 25 mcg.  He indicated that this bolus provided him with relief of the pain for approximately 2 hours.  He denied any side effects such as sleepiness or weakness of the lower extremities.  Based on this we have programmed the above.  In addition, today we will not be refilling his oral medication.  He refers having used 25 of the 60 pills that he was  provided on his last prescription.  I will schedule him to return in 2 days from now for another evaluation and adjustment.  He should have more than enough medicine to last until then.  He was reminded not to take any of the oral medications unless it is absolutely necessary due to severe pain.  Pharmacotherapy Assessment  Analgesic: Oxycodone IR $RemoveBefo'10mg'thiGzPKmIUH$ , 1 tab PO q 4 hrs (60 mg/day of oxycodone IR) (90 MME) + morphine ER 30 mg twice  daily (60 mg/day of morphine ER) (60 MME) (150 MME/day). Highest recorded MME/day: 150 mg/day 90 day average MME/day: 77.89 mg/day   Monitoring: [place] PMP: PDMP reviewed during this encounter.       Pharmacotherapy: No side-effects or adverse reactions reported. Compliance: No problems identified. Effectiveness: Clinically acceptable. Plan: Refer to "POC".  UDS: No results found for: SUMMARY  Intrathecal Pump Therapy Assessment  Manufacturer: Medtronic Synchromed Type: Programmable Volume: 40 mL reservoir MRI compatibility: Yes   Drug content:  Primary Medication Class: Opioid Primary Medication: PF-Fentanyl (2000 mcg/mL)  Secondary Medication: PF-Bupivacaine (20 mg/mL)  Other Medication: None   Programming:  Type: Simple continuous. See pump readout for details.  The following adjustments were performed: Increase the simple continuous infusion 24-hour dose to 200 mcg/day of fentanyl (2.0 mg/day of bupivacaine) Add a PTM option for the patient. (See programming details below) PTM dose: 25 mcg bolus Duration of bolus: 1 minute Lockout interval: 6 hours Maximum 24-hour doses: 4  Changes:  Medication Change: None at this point Rate Change: The rate has been doubled.  Reported side-effects or adverse reactions: None reported.   Effectiveness: Described as relatively effective, allowing for increase in activities of daily living (ADL) Clinically meaningful improvement in function (CMIF): Sustained CMIF goals met  Plan: Pump refill today  Pre-op Assessment:  James [name] is a 69 y.o. (year old), male patient, seen today for interventional treatment. He  has a past surgical history that includes Cholecystectomy (1983); Esophagogastroduodenoscopy (egd) with propofol (N/A, 11/20/2016); Cataract extraction w/ intraocular lens implant (Bilateral); Lower Extremity Angiography (Right, 05/19/2017); Esophagogastroduodenoscopy (egd) with propofol (N/A, 10/27/2017); Colonoscopy with propofol  (N/A, 10/27/2017); Lower Extremity Angiography (Left, 08/10/2018); and Intrathecal pump implant (Left, 02/07/2020). James [name] has a current medication list which includes the following prescription(s): atorvastatin, ensure, folic acid, metformin, methocarbamol, morphine, naloxegol oxalate, naloxone, oxycodone hcl, [START ON 05/18/2020] PAIN MANAGEMENT IT PUMP REFILL, pantoprazole, sucralfate, acyclovir, calcium plus d3 absorbable, vitamin d3, clopidogrel, and tamsulosin. His primarily concern today is the Abdominal Pain  Initial Vital Signs:  Pulse/HCG Rate: 100  Temp: 97.9 F (36.6 C) Resp:   BP: (!) 144/82 SpO2: 100 %  BMI: Estimated body mass index is 23.72 kg/m as calculated from the following:   Height as of this encounter: $RemoveBeforeD'5\' 8"'PMSmToPssDYrfJ$  (1.727 m).   Weight as of this encounter: 156 lb (70.8 kg).  Risk Assessment: Allergies: Reviewed. He has No Known Allergies.  Allergy Precautions: None required Coagulopathies: Reviewed. None identified.  Blood-thinner therapy: None at this time Active Infection(s): Reviewed. None identified. James [name] is afebrile  Site Confirmation: James [name] was asked to confirm the procedure and laterality before marking the site Procedure checklist: Completed Consent: Before the procedure and under the influence of no sedative(s), amnesic(s), or anxiolytics, the patient was informed of the treatment options, risks and possible complications. To fulfill our ethical and legal obligations, as recommended by the American Medical Association's Code of Ethics, I have informed the patient of my clinical impression; the nature and purpose of the  treatment or procedure; the risks, benefits, and possible complications of the intervention; the alternatives, including doing nothing; the risk(s) and benefit(s) of the alternative treatment(s) or procedure(s); and the risk(s) and benefit(s) of doing nothing.  James [name] was provided with information about the general risks and possible  complications associated with most interventional procedures. These include, but are not limited to: failure to achieve desired goals, infection, bleeding, organ or nerve damage, allergic reactions, paralysis, and/or death.  In addition, he was informed of those risks and possible complications associated to this particular procedure, which include, but are not limited to: damage to the implant; failure to decrease pain; local, systemic, or serious CNS infections, intraspinal abscess with possible cord compression and paralysis, or life-threatening such as meningitis; bleeding; organ damage; nerve injury or damage with subsequent sensory, motor, and/or autonomic system dysfunction, resulting in transient or permanent pain, numbness, and/or weakness of one or several areas of the body; allergic reactions, either minor or major life-threatening, such as anaphylactic or anaphylactoid reactions.  Furthermore, James [name] was informed of those risks and complications associated with the medications. These include, but are not limited to: allergic reactions (i.e.: anaphylactic or anaphylactoid reactions); endorphine suppression; bradycardia and/or hypotension; water retention and/or peripheral vascular relaxation leading to lower extremity edema and possible stasis ulcers; respiratory depression and/or shortness of breath; decreased metabolic rate leading to weight gain; swelling or edema; medication-induced neural toxicity; particulate matter embolism and blood vessel occlusion with resultant organ, and/or nervous system infarction; and/or intrathecal granuloma formation with possible spinal cord compression and permanent paralysis.  Before refilling the pump James [name] was informed that some of the medications used in the devise may not be FDA approved for such use and therefore it constitutes an off-label use of the medications.  Finally, he was informed that Medicine is not an exact science; therefore, there is  also the possibility of unforeseen or unpredictable risks and/or possible complications that may result in a catastrophic outcome. The patient indicated having understood very clearly. We have given the patient no guarantees and we have made no promises. Enough time was given to the patient to ask questions, all of which were answered to the patient's satisfaction. James [name] has indicated that he wanted to continue with the procedure. Attestation: I, the ordering provider, attest that I have discussed with the patient the benefits, risks, side-effects, alternatives, likelihood of achieving goals, and potential problems during recovery for the procedure that I have provided informed consent. Date  Time: 03/06/2020  9:57 AM  Pre-Procedure Preparation:  Monitoring: As per clinic protocol. Respiration, ETCO2, SpO2, BP, heart rate and rhythm monitor placed and checked for adequate function Safety Precautions: Patient was assessed for positional comfort and pressure points before starting the procedure. Time-out: I initiated and conducted the "Time-out" before starting the procedure, as per protocol. The patient was asked to participate by confirming the accuracy of the "Time Out" information. Verification of the correct person, site, and procedure were performed and confirmed by me, the nursing staff, and the patient. "Time-out" conducted as per Joint Commission's Universal Protocol (UP.01.01.01). Time:    Description of Procedure:          Position: Supine Target Area: Central-port of intrathecal pump. Approach: Anterior, 90 degree angle approach. Area Prepped: Entire Area around the pump implant. DuraPrep (Iodine Povacrylex [0.7% available iodine] and Isopropyl Alcohol, 74% w/w) Safety Precautions: Aspiration looking for blood return was conducted prior to all injections. At no point did we inject any substances, as  a needle was being advanced. No attempts were made at seeking any paresthesias. Safe  injection practices and needle disposal techniques used. Medications properly checked for expiration dates. SDV (single dose vial) medications used. Description of the Procedure: Protocol guidelines were followed. Two nurses trained to do implant refills were present during the entire procedure. The refill medication was checked by both healthcare providers as well as the patient. The patient was included in the "Time-out" to verify the medication. The patient was placed in position. The pump was identified. The area was prepped in the usual manner. The sterile template was positioned over the pump, making sure the side-port location matched that of the pump. Both, the pump and the template were held for stability. The needle provided in the Medtronic Kit was then introduced thru the center of the template and into the central port. The pump content was aspirated and discarded volume documented. The new medication was slowly infused into the pump, thru the filter, making sure to avoid overpressure of the device. The needle was then removed and the area cleansed, making sure to leave some of the prepping solution back to take advantage of its long term bactericidal properties. The pump was interrogated and programmed to reflect the correct medication, volume, and dosage. The program was printed and taken to the physician for approval. Once checked and signed by the physician, a copy was provided to the patient and another scanned into the EMR. Vitals:   03/06/20 0956  BP: (!) 144/82  Pulse: 100  Temp: 97.9 F (36.6 C)  SpO2: 100%  Weight: 156 lb (70.8 kg)  Height: $Remove'5\' 8"'cuBUGDZ$  (1.727 m)    Start Time:   hrs. End Time:   hrs. Materials & Medications: Medtronic Refill Kit Medication(s): Please see chart orders for details.  Imaging Guidance:          Type of Imaging Technique: None used Indication(s): N/A Exposure Time: No patient exposure Contrast: None used. Fluoroscopic Guidance: N/A Ultrasound  Guidance: N/A Interpretation: N/A  Antibiotic Prophylaxis:   Anti-infectives (From admission, onward)   None     Indication(s): None identified  Post-operative Assessment:  Post-procedure Vital Signs:  Pulse/HCG Rate: 100  Temp: 97.9 F (36.6 C) Resp:   BP: (!) 144/82 SpO2: 100 %  EBL: None  Complications: No immediate post-treatment complications observed by team, or reported by patient.  Note: The patient tolerated the entire procedure well. A repeat set of vitals were taken after the procedure and the patient was kept under observation following institutional policy, for this type of procedure. Post-procedural neurological assessment was performed, showing return to baseline, prior to discharge. The patient was provided with post-procedure discharge instructions, including a section on how to identify potential problems. Should any problems arise concerning this procedure, the patient was given instructions to immediately contact us, at any time, without hesitation. In any case, we plan to contact the patient by telephone for a follow-up status report regarding this interventional procedure.  Comments:  No additional relevant information.  Plan of Care  Orders:  Orders Placed This Encounter  Procedures  . PUMP REPROGRAM    Follow programming protocol by having two(2) healthcare providers present during programming. Please perform the following adjustment: Increase the simple continuous infusion 24-hour dose to 200 mcg/day of fentanyl (2.0 mg/day of bupivacaine) At a PTM option for the patient. PTM dose: 25 mcg bolus Duration of bolus: 1 minute Lockout interval: 6 hours Maximum 24-hour doses: 4    Order Specific Question:  Where will this procedure be performed?    Answer:   [facility]  . PUMP REPROGRAM    Standing Status:   Future    Standing Expiration Date:   04/05/2020    Scheduling Instructions:     Have the patient return to clinics on Thursday for  follow-up and further adjustment of the intrathecal pump.    Order Specific Question:   Where will this procedure be performed?    Answer:   [facility]   Chronic Opioid Analgesic:  Oxycodone IR $RemoveBefo'10mg'cRJJsbQBwHA$ , 1 tab PO q 4 hrs (60 mg/day of oxycodone IR) (90 MME) + morphine ER 30 mg twice daily (60 mg/day of morphine ER) (60 MME) (150 MME/day). Highest recorded MME/day: 150 mg/day 90 day average MME/day: 77.89 mg/day   Medications ordered for procedure: No orders of the defined types were placed in this encounter.  Medications administered: Dorse F. Menton had no medications administered during this visit.  See the medical record for exact dosing, route, and time of administration.  Follow-up plan:   No follow-ups on file.       Interventional treatment options: Planned, scheduled, and/or pending:    NOTE: PLAVIX ANTICOAGULATION  (Stop: 7 days  Restart: 2 hrs)    Under consideration:   Diagnostic intrathecal injection of opioid analgesic (intrathecal pump trial).   Therapeutic/palliative (PRN):   Diagnostic bilateral celiac plexus block #3  (patient indicated no relief)    Recent Visits Date Type Provider Dept  03/02/20 Procedure visit Milinda Pointer, MD [facility]  12/21/19 Telemedicine Milinda Pointer, MD [facility]  12/14/19 Procedure visit Milinda Pointer, MD [facility]  Showing recent visits within past 90 days and meeting all other requirements Today's Visits Date Type Provider Dept  03/06/20 Procedure visit Milinda Pointer, MD [facility]  Showing today's visits and meeting all other requirements Future Appointments Date Type Provider Dept  03/09/20 Appointment Milinda Pointer, MD [facility]  06/01/20 Appointment Milinda Pointer, MD [facility]  Showing future appointments within next 90 days and meeting all other requirements  Disposition: Discharge home  Discharge (Date  Time):  03/06/2020;   hrs.   Primary Care Physician: [name], MD Location: [facility] Note by: Gaspar Cola, MD Date: 03/06/2020; Time: 1:15 PM  Disclaimer:  Medicine is not an Chief Strategy Officer. The only guarantee in medicine is that nothing is guaranteed. It is important to note that the decision to proceed with this intervention was based on the information collected from the patient. The Data and conclusions were drawn from the patient's questionnaire, the interview, and the physical examination. Because the information was provided in large part by the patient, it cannot be guaranteed that it has not been purposely or unconsciously manipulated. Every effort has been made to obtain as much relevant data as possible for this evaluation. It is important to note that the conclusions that lead to this procedure are derived in large part from the available data. Always take into account that the treatment will also be dependent on availability of resources and existing treatment guidelines, considered by other Pain Management Practitioners as being common knowledge and practice, at the time of the intervention. For Medico-Legal purposes, it is also important to point out that variation in procedural techniques and pharmacological choices are the acceptable norm. The indications, contraindications, technique, and results of the above procedure should only be interpreted and judged by a Board-Certified Interventional Pain Specialist with  extensive familiarity and expertise in the same exact procedure and technique.

## 2020-03-07 ENCOUNTER — Telehealth: Payer: Self-pay

## 2020-03-07 NOTE — Telephone Encounter (Signed)
Called patient after IT Pump adjustment. Explained to him how to use and that he can only have a bolus every four years. Patient with understneding. Instructed to call if needed.

## 2020-03-08 MED FILL — Medication: INTRATHECAL | Qty: 1 | Status: AC

## 2020-03-08 NOTE — Progress Notes (Signed)
PROVIDER NOTE: Information contained herein reflects review and annotations entered in association with encounter. Interpretation of such information and data should be left to medically-trained personnel. Information provided to patient can be located elsewhere in the medical record under "Patient Instructions". Document created using STT-dictation technology, any transcriptional errors that may result from process are unintentional.    Patient: James [name]  Service Category: Procedure  Provider: Gaspar Cola, MD  DOB: [birth year]  DOS: 03/09/2020  Location: [facility]  MRN: [id number]  Setting: Ambulatory - outpatient  Referring Provider: [name], MD  Type: Established Patient  Specialty: Interventional Pain Management  PCP: [name], MD   Primary Reason for Visit: Interventional Pain Management Treatment. CC: Abdominal Pain  Procedure:          Intrathecal Drug Delivery System (IDDS):  Type: Analysis & Programming [phone])       Region: Abdominal Laterality: Left  Type of Pump: Medtronic Synchromed II Delivery Route: Intrathecal Type of Pain Treated: Neuropathic/Nociceptive Primary Medication Class: Opioid/opiate  Medication, Concentration, Infusion Program, & Delivery Rate: Please see scanned programming printout.   Indications: 1. Chronic pain syndrome   2. Cancer-related pain   3. Chronic abdominal pain   4. Abdominal wall pain in epigastric region   5. CLL (chronic lymphocytic leukemia) ([place])   6. Generalized abdominal pain   7. Malignant lymphoma, small lymphocytic ([place])   8. Physical tolerance to opiate drug   9. Presence of implanted infusion pump (Medtronic)   10. Presence of intrathecal pump (Medtronic)   11. Encounter for adjustment and management of infusion pump    Pain Assessment: Self-Reported Pain Score: 9 /10             Reported level is inconsistent with clinical observations. While he claims to be having a pain  score of 9/10, when I walked into the exam room today he was sound asleep.  This too are not compatible.  Pharmacotherapy Assessment  Analgesic: Oxycodone IR 10mg , 1 tab PO q 4 hrs (60 mg/day of oxycodone IR) (90 MME) + morphine ER 30 mg twice daily (60 mg/day of morphine ER) (60 MME) (150 MME/day). Highest recorded MME/day: 150 mg/day 90 day average MME/day: 77.89 mg/day   Monitoring: [place] PMP: PDMP reviewed during this encounter. Since he was last instructed not to request or accept additional pain medication from any other physician, he was found on the PMP to have received some additional morphine. Pharmacotherapy: No side-effects or adverse reactions reported. Compliance: Unfortunately, the patient is noncompliant with our medication rules and regulations.  Today I had to again remind him to read-rules and regulations.  Once again, he did not bring his medications for pill count. Effectiveness: He claims not to be getting sufficient relief from the therapy. Plan: Refer to "POC".  UDS: No results found for: SUMMARY  Intrathecal Pump Therapy Assessment  Manufacturer: Medtronic Synchromed Type: Programmable Volume: 40 mL reservoir MRI compatibility: Yes   Drug content:  Primary Medication Class:Opioid Primary Medication:PF-Fentanyl(2000 mcg/mL) Secondary Medication:PF-Bupivacaine(20 mg/mL) Other Medication:None  PTM (PCA) mode:  PTM dose: 25 mcg bolus Duration of bolus: 1 minute Lockout interval: 6 hours Maximum 24-hour doses: 4  Programming:  Type: Simple continuous with PCA. See pump readout for details.   Changes:  Medication Change: None at this point Rate Change: Today we have reviewed the programming on his intrathecal pump and he has been using his PCA mode to the maximum meaning that he had a total of 300 mcg/day, and  he is still complaining of insufficient analgesia.  For this reason, today we have increased his basal rate to 300 mcg/day and we have kept the  PCA programming the same.  With that combination, he should be able to get a maximum of 400 mcg/day of fentanyl and 4 mg/day of bupivacaine.  Reported side-effects or adverse reactions: None reported  Effectiveness: Described as ineffective and would like to make some changes Clinically meaningful improvement in function (CMIF): Medication does not meet basic CMIF  Plan: Analysis and programming.  We will have the patient return on Monday for reassessment and possible reprogramming of further increases.  Pre-op H&P:  [name] is a 69 y.o. (year old), male patient, seen today for interventional treatment. He  has a past surgical history that includes Cholecystectomy (1983); Esophagogastroduodenoscopy (egd) with propofol (N/A, 11/20/2016); Cataract extraction w/ intraocular lens implant (Bilateral); Lower Extremity Angiography (Right, 05/19/2017); Esophagogastroduodenoscopy (egd) with propofol (N/A, 10/27/2017); Colonoscopy with propofol (N/A, 10/27/2017); Lower Extremity Angiography (Left, 08/10/2018); and Intrathecal pump implant (Left, 02/07/2020). Mr. Russett has a current medication list which includes the following prescription(s): atorvastatin, clopidogrel, ensure, folic acid, metformin, methocarbamol, naloxegol oxalate, naloxone, oxycodone hcl, [START ON 05/18/2020] PAIN MANAGEMENT IT PUMP REFILL, pantoprazole, and sucralfate. His primarily concern today is the Abdominal Pain  Initial Vital Signs:  Pulse/HCG Rate: (!) 101  Temp: (!) 97.1 F (36.2 C) BP: 102/84 SpO2: 100 %  BMI: Estimated body mass index is 23.72 kg/m as calculated from the following:   Height as of this encounter: 5\' 8"  (1.727 m).   Weight as of this encounter: 156 lb (70.8 kg).  Risk Assessment: Allergies: Reviewed. He has No Known Allergies.  Allergy Precautions: None required Coagulopathies: Reviewed. None identified.  Blood-thinner therapy: None at this time Active Infection(s): Reviewed. None identified. [name] is  afebrile  Site Confirmation: Mr. Casanas was asked to confirm the procedure and laterality before marking the site Procedure checklist: Completed Consent: Before the procedure and under the influence of no sedative(s), amnesic(s), or anxiolytics, the patient was informed of the treatment options, risks and possible complications. To fulfill our ethical and legal obligations, as recommended by the American Medical Association's Code of Ethics, I have informed the patient of my clinical impression; the nature and purpose of the treatment or procedure; the risks, benefits, and possible complications of the intervention; the alternatives, including doing nothing; the risk(s) and benefit(s) of the alternative treatment(s) or procedure(s); and the risk(s) and benefit(s) of doing nothing.  Mr. Quinley was provided with information about the general risks and possible complications associated with most interventional procedures. These include, but are not limited to: failure to achieve desired goals, infection, bleeding, organ or nerve damage, allergic reactions, paralysis, and/or death.  In addition, he was informed of those risks and possible complications associated to this particular procedure, which include, but are not limited to: damage to the implant; failure to decrease pain; local, systemic, or serious CNS infections, intraspinal abscess with possible cord compression and paralysis, or life-threatening such as meningitis; bleeding; organ damage; nerve injury or damage with subsequent sensory, motor, and/or autonomic system dysfunction, resulting in transient or permanent pain, numbness, and/or weakness of one or several areas of the body; allergic reactions, either minor or major life-threatening, such as anaphylactic or anaphylactoid reactions.  Furthermore, Mr. Ebeling was informed of those risks and complications associated with the medications. These include, but are not limited to: allergic reactions  (i.e.: anaphylactic or anaphylactoid reactions); endorphine suppression; bradycardia and/or hypotension; water  retention and/or peripheral vascular relaxation leading to lower extremity edema and possible stasis ulcers; respiratory depression and/or shortness of breath; decreased metabolic rate leading to weight gain; swelling or edema; medication-induced neural toxicity; particulate matter embolism and blood vessel occlusion with resultant organ, and/or nervous system infarction; and/or intrathecal granuloma formation with possible spinal cord compression and permanent paralysis.  Before refilling the pump Mr. Pritt was informed that some of the medications used in the devise may not be FDA approved for such use and therefore it constitutes an off-label use of the medications.  Finally, he was informed that Medicine is not an exact science; therefore, there is also the possibility of unforeseen or unpredictable risks and/or possible complications that may result in a catastrophic outcome. The patient indicated having understood very clearly. We have given the patient no guarantees and we have made no promises. Enough time was given to the patient to ask questions, all of which were answered to the patient's satisfaction. Mr. Pavey has indicated that he wanted to continue with the procedure. Attestation: I, the ordering provider, attest that I have discussed with the patient the benefits, risks, side-effects, alternatives, likelihood of achieving goals, and potential problems during recovery for the procedure that I have provided informed consent. Date  Time: 03/09/2020  1:14 PM  Pre-Procedure Preparation:  Monitoring: As per clinic protocol. Respiration, ETCO2, SpO2, BP, heart rate and rhythm monitor placed and checked for adequate function Safety Precautions: Patient was assessed for positional comfort and pressure points before starting the procedure. Time-out: I initiated and conducted the "Time-out"  before starting the procedure, as per protocol. The patient was asked to participate by confirming the accuracy of the "Time Out" information. Verification of the correct person, site, and procedure were performed and confirmed by me, the nursing staff, and the patient. "Time-out" conducted as per Joint Commission's Universal Protocol (UP.01.01.01). Time:    Description of Procedure:          Position: Supine Target Area: Not applicable Approach: Not applicable Area Prepped: Not required for simple analysis and programming Safety Precautions: At least two trained healthcare practitioners present to verify programming and calculations. Description of the Procedure: Protocol guidelines were followed.      The pump was interrogated and programmed to reflect the correct medication, volume, and dosage. The program was printed and taken to the physician for approval. Once checked and signed by the physician, a copy was provided to the patient and another scanned into the EMR.  Vitals:   03/09/20 1307  BP: 102/84  Pulse: (!) 101  Temp: (!) 97.1 F (36.2 C)  SpO2: 100%  Weight: 156 lb (70.8 kg)  Height: 5\' 8"  (1.727 m)   Materials & Medications: None, except for Medtronic programmer Medication(s): Please see chart orders for details.  Imaging Guidance:          Type of Imaging Technique: None used Indication(s): N/A Exposure Time: No patient exposure Contrast: None used. Fluoroscopic Guidance: N/A Ultrasound Guidance: N/A Interpretation: N/A  Antibiotic Prophylaxis:   Anti-infectives (From admission, onward)   None     Indication(s): None identified  Post-operative Assessment:  Post-procedure Vital Signs:  Pulse/HCG Rate: (!) 101  Temp: (!) 97.1 F (36.2 C) Resp:   BP: 102/84 SpO2: 100 %  EBL: None  Complications: No immediate post-treatment complications observed by team, or reported by patient.  Note: The patient tolerated the entire procedure well. A repeat set of  vitals were taken after the procedure and the patient was  kept under observation following institutional policy, for this type of procedure. Post-procedural neurological assessment was performed, showing return to baseline, prior to discharge. The patient was provided with post-procedure discharge instructions, including a section on how to identify potential problems. Should any problems arise concerning this procedure, the patient was given instructions to immediately contact us, at any time, without hesitation. In any case, we plan to contact the patient by telephone for a follow-up status report regarding this interventional procedure.  Comments:  No additional relevant information.  Plan of Care  Orders:  Orders Placed This Encounter  Procedures  . PUMP REPROGRAM    Follow programming protocol by having two(2) healthcare providers present during programming.    Scheduling Instructions:     Please perform the following adjustment: Increase rate to 300 mcg/day. Continue with same PCA program.    Order Specific Question:   Where will this procedure be performed?    Answer:   [facility]   Chronic Opioid Analgesic:  Oxycodone IR 10mg , 1 tab PO q 4 hrs (60 mg/day of oxycodone IR) (90 MME) + morphine ER 30 mg twice daily (60 mg/day of morphine ER) (60 MME) (150 MME/day). Highest recorded MME/day: 150 mg/day 90 day average MME/day: 77.89 mg/day   Medications ordered for procedure: No orders of the defined types were placed in this encounter.  Medications administered: Eddrick F. Barcia had no medications administered during this visit.  See the medical record for exact dosing, route, and time of administration.  Follow-up plan:   Return in about 4 days (around 03/13/2020) for (F2F) ITP programming evaluation.       Interventional treatment options: Planned, scheduled, and/or pending:    NOTE: PLAVIX ANTICOAGULATION  (Stop: 7 days  Restart: 2 hrs)    Under consideration:    Diagnostic intrathecal injection of opioid analgesic (intrathecal pump trial).   Therapeutic/palliative (PRN):   Diagnostic bilateral celiac plexus block #3  (patient indicated no relief)     Recent Visits Date Type Provider Dept  03/06/20 Procedure visit Milinda Pointer, MD [facility]  03/02/20 Procedure visit Milinda Pointer, MD [facility]  12/21/19 Telemedicine Milinda Pointer, MD [facility]  12/14/19 Procedure visit Milinda Pointer, MD [facility]  Showing recent visits within past 90 days and meeting all other requirements Today's Visits Date Type Provider Dept  03/09/20 Procedure visit Milinda Pointer, MD [facility]  Showing today's visits and meeting all other requirements Future Appointments Date Type Provider Dept  03/13/20 Appointment Milinda Pointer, MD [facility]  06/01/20 Appointment Milinda Pointer, MD [facility]  Showing future appointments within next 90 days and meeting all other requirements  Disposition: Discharge home  Discharge (Date  Time): 03/09/2020;   hrs.   Primary Care Physician: [name], MD Location: [facility] Note by: Gaspar Cola, MD Date: 03/09/2020; Time: 3:09 PM  Disclaimer:  Medicine is not an Chief Strategy Officer. The only guarantee in medicine is that nothing is guaranteed. It is important to note that the decision to proceed with this intervention was based on the information collected from the patient. The Data and conclusions were drawn from the patient's questionnaire, the interview, and the physical examination. Because the information was provided in large part by the patient, it cannot be guaranteed that it has not been purposely or unconsciously manipulated. Every effort has been made to obtain as much relevant data as possible for this evaluation. It is important to note  that the conclusions that lead  to this procedure are derived in large part from the available data. Always take into account that the treatment will also be dependent on availability of resources and existing treatment guidelines, considered by other Pain Management Practitioners as being common knowledge and practice, at the time of the intervention. For Medico-Legal purposes, it is also important to point out that variation in procedural techniques and pharmacological choices are the acceptable norm. The indications, contraindications, technique, and results of the above procedure should only be interpreted and judged by a Board-Certified Interventional Pain Specialist with extensive familiarity and expertise in the same exact procedure and technique.

## 2020-03-09 ENCOUNTER — Encounter: Payer: Self-pay | Admitting: Pain Medicine

## 2020-03-09 ENCOUNTER — Other Ambulatory Visit: Payer: Self-pay

## 2020-03-09 ENCOUNTER — Ambulatory Visit: Payer: Medicare Other | Attending: Pain Medicine | Admitting: Pain Medicine

## 2020-03-09 VITALS — BP 102/84 | HR 101 | Temp 97.1°F | Ht 68.0 in | Wt 156.0 lb

## 2020-03-09 DIAGNOSIS — R1084 Generalized abdominal pain: Secondary | ICD-10-CM

## 2020-03-09 DIAGNOSIS — G893 Neoplasm related pain (acute) (chronic): Secondary | ICD-10-CM

## 2020-03-09 DIAGNOSIS — Z91148 Patient's other noncompliance with medication regimen for other reason: Secondary | ICD-10-CM | POA: Insufficient documentation

## 2020-03-09 DIAGNOSIS — G894 Chronic pain syndrome: Secondary | ICD-10-CM

## 2020-03-09 DIAGNOSIS — G8929 Other chronic pain: Secondary | ICD-10-CM

## 2020-03-09 DIAGNOSIS — F1190 Opioid use, unspecified, uncomplicated: Secondary | ICD-10-CM

## 2020-03-09 DIAGNOSIS — Z451 Encounter for adjustment and management of infusion pump: Secondary | ICD-10-CM

## 2020-03-09 DIAGNOSIS — R1013 Epigastric pain: Secondary | ICD-10-CM

## 2020-03-09 DIAGNOSIS — C9110 Chronic lymphocytic leukemia of B-cell type not having achieved remission: Secondary | ICD-10-CM

## 2020-03-09 DIAGNOSIS — C8300 Small cell B-cell lymphoma, unspecified site: Secondary | ICD-10-CM

## 2020-03-09 DIAGNOSIS — Z9114 Patient's other noncompliance with medication regimen: Secondary | ICD-10-CM

## 2020-03-09 DIAGNOSIS — Z95828 Presence of other vascular implants and grafts: Secondary | ICD-10-CM

## 2020-03-09 DIAGNOSIS — Z978 Presence of other specified devices: Secondary | ICD-10-CM

## 2020-03-09 DIAGNOSIS — R109 Unspecified abdominal pain: Secondary | ICD-10-CM

## 2020-03-09 NOTE — Patient Instructions (Addendum)
____________________________________________________________________________________________  CBD (cannabidiol) WARNING  Applicable to: All individuals currently taking or considering taking CBD (cannabidiol) and, more important, all patients taking opioid analgesic controlled substances (pain medication). (Example: oxycodone; oxymorphone; hydrocodone; hydromorphone; morphine; methadone; tramadol; tapentadol; fentanyl; buprenorphine; butorphanol; dextromethorphan; meperidine; codeine; etc.)  Legal status: CBD remains a Schedule I drug prohibited for any use. CBD is illegal with one exception. In the [place], CBD has a limited Food and Drug Administration (FDA) approval for the treatment of two specific types of epilepsy disorders. Only one CBD product has been approved by the FDA for this purpose: "Epidiolex". FDA is aware that some companies are marketing products containing cannabis and cannabis-derived compounds in ways that violate the Federal Food, Drug and Cosmetic Act (FD&C Act) and that may put the health and safety of consumers at risk. The FDA, a Federal agency, has not enforced the CBD status since 2018.   Legality: Some manufacturers ship CBD products nationally, which is illegal. Often such products are sold online and are therefore available throughout the country. CBD is openly sold in head shops and health food stores in some states where such sales have not been explicitly legalized. Selling unapproved products with unsubstantiated therapeutic claims is not only a violation of the law, but also can put patients at risk, as these products have not been proven to be safe or effective. Federal illegality makes it difficult to conduct research on CBD.  Reference: "FDA Regulation of Cannabis and Cannabis-Derived Products, Including Cannabidiol (CBD)" -  https://www.fda.gov/news-events/public-health-focus/fda-regulation-cannabis-and-cannabis-derived-products-including-cannabidiol-cbd  Warning: CBD is not FDA approved and has not undergo the same manufacturing controls as prescription drugs.  This means that the purity and safety of available CBD may be questionable. Most of the time, despite manufacturer's claims, it is contaminated with THC (delta-9-tetrahydrocannabinol - the chemical in marijuana responsible for the "HIGH").  When this is the case, the THC contaminant will trigger a positive urine drug screen (UDS) test for Marijuana (carboxy-THC). Because a positive UDS for any illicit substance is a violation of our medication agreement, your opioid analgesics (pain medicine) may be permanently discontinued.  MORE ABOUT CBD  General Information: CBD  is a derivative of the Marijuana (cannabis sativa) plant discovered in 1940. It is one of the 113 identified substances found in Marijuana. It accounts for up to 40% of the plant's extract. As of 2018, preliminary clinical studies on CBD included research for the treatment of anxiety, movement disorders, and pain. CBD is available and consumed in multiple forms, including inhalation of smoke or vapor, as an aerosol spray, and by mouth. It may be supplied as an oil containing CBD, capsules, dried cannabis, or as a liquid solution. CBD is thought not to be as psychoactive as THC (delta-9-tetrahydrocannabinol - the chemical in marijuana responsible for the "HIGH"). Studies suggest that CBD may interact with different biological target receptors in the body, including cannabinoid and other neurotransmitter receptors. As of 2018 the mechanism of action for its biological effects has not been determined.  Side-effects  Adverse reactions: Dry mouth, diarrhea, decreased appetite, fatigue, drowsiness, malaise, weakness, sleep disturbances, and others.  Drug interactions: CBC may interact with other medications  such as blood-thinners. (Last update: 12/04/2019) ____________________________________________________________________________________________   ____________________________________________________________________________________________  Medication Rules  Purpose: To inform patients, and their family members, of our rules and regulations.  Applies to: All patients receiving prescriptions (written or electronic).  Pharmacy of record: Pharmacy where electronic prescriptions will be sent. If written prescriptions are taken to a different pharmacy, please inform   the nursing staff. The pharmacy listed in the electronic medical record should be the one where you would like electronic prescriptions to be sent.  Electronic prescriptions: In compliance with the [place] Strengthen Opioid Misuse Prevention (STOP) Act of 2017 (Session Law 2017-74/H243), effective April 29, 2018, all controlled substances must be electronically prescribed. Calling prescriptions to the pharmacy will cease to exist.  Prescription refills: Only during scheduled appointments. Applies to all prescriptions.  NOTE: The following applies primarily to controlled substances (Opioid* Pain Medications).   Type of encounter (visit): For patients receiving controlled substances, face-to-face visits are required. (Not an option or up to the patient.)  Patient's responsibilities: 1. Pain Pills: Bring all pain pills to every appointment (except for procedure appointments). 2. Pill Bottles: Bring pills in original pharmacy bottle. Always bring the newest bottle. Bring bottle, even if empty. 3. Medication refills: You are responsible for knowing and keeping track of what medications you take and those you need refilled. The day before your appointment: write a list of all prescriptions that need to be refilled. The day of the appointment: give the list to the admitting nurse. Prescriptions will be written only during  appointments. No prescriptions will be written on procedure days. If you forget a medication: it will not be "Called in", "Faxed", or "electronically sent". You will need to get another appointment to get these prescribed. No early refills. Do not call asking to have your prescription filled early. 4. Prescription Accuracy: You are responsible for carefully inspecting your prescriptions before leaving our office. Have the discharge nurse carefully go over each prescription with you, before taking them home. Make sure that your name is accurately spelled, that your address is correct. Check the name and dose of your medication to make sure it is accurate. Check the number of pills, and the written instructions to make sure they are clear and accurate. Make sure that you are given enough medication to last until your next medication refill appointment. 5. Taking Medication: Take medication as prescribed. When it comes to controlled substances, taking less pills or less frequently than prescribed is permitted and encouraged. Never take more pills than instructed. Never take medication more frequently than prescribed.  6. Inform other Doctors: Always inform, all of your healthcare providers, of all the medications you take. 7. Pain Medication from other Providers: You are not allowed to accept any additional pain medication from any other Doctor or Healthcare provider. There are two exceptions to this rule. (see below) In the event that you require additional pain medication, you are responsible for notifying us, as stated below. 8. Medication Agreement: You are responsible for carefully reading and following our Medication Agreement. This must be signed before receiving any prescriptions from our practice. Safely store a copy of your signed Agreement. Violations to the Agreement will result in no further prescriptions. (Additional copies of our Medication Agreement are available upon request.) 9. Laws, Rules,  & Regulations: All patients are expected to follow all Federal and State Laws, Statutes, Rules, & Regulations. Ignorance of the Laws does not constitute a valid excuse.  10. Illegal drugs and Controlled Substances: The use of illegal substances (including, but not limited to marijuana and its derivatives) and/or the illegal use of any controlled substances is strictly prohibited. Violation of this rule may result in the immediate and permanent discontinuation of any and all prescriptions being written by our practice. The use of any illegal substances is prohibited. 11. Adopted CDC guidelines & recommendations: Target dosing   levels will be at or below 60 MME/day. Use of benzodiazepines** is not recommended.  Exceptions: There are only two exceptions to the rule of not receiving pain medications from other Healthcare Providers. 1. Exception #1 (Emergencies): In the event of an emergency (i.e.: accident requiring emergency care), you are allowed to receive additional pain medication. However, you are responsible for: As soon as you are able, call our office [phone], at any time of the day or night, and leave a message stating your name, the date and nature of the emergency, and the name and dose of the medication prescribed. In the event that your call is answered by a member of our staff, make sure to document and save the date, time, and the name of the person that took your information.  2. Exception #2 (Planned Surgery): In the event that you are scheduled by another doctor or dentist to have any type of surgery or procedure, you are allowed (for a period no longer than 30 days), to receive additional pain medication, for the acute post-op pain. However, in this case, you are responsible for picking up a copy of our "Post-op Pain Management for Surgeons" handout, and giving it to your surgeon or dentist. This document is available at our office, and does not require an appointment to obtain it. Simply  go to our office during business hours (Monday-Thursday from 8:00 AM to 4:00 PM) (Friday 8:00 AM to 12:00 Noon) or if you have a scheduled appointment with us, prior to your surgery, and ask for it by name. In addition, you are responsible for: calling our office [phone], at any time of the day or night, and leaving a message stating your name, name of your surgeon, type of surgery, and date of procedure or surgery. Failure to comply with your responsibilities may result in termination of therapy involving the controlled substances.  *Opioid medications include: morphine, codeine, oxycodone, oxymorphone, hydrocodone, hydromorphone, meperidine, tramadol, tapentadol, buprenorphine, fentanyl, methadone. **Benzodiazepine medications include: diazepam (Valium), alprazolam (Xanax), clonazepam (Klonopine), lorazepam (Ativan), clorazepate (Tranxene), chlordiazepoxide (Librium), estazolam (Prosom), oxazepam (Serax), temazepam (Restoril), triazolam (Halcion) (Last updated: 01/04/2020) ____________________________________________________________________________________________   ____________________________________________________________________________________________  Medication Recommendations and Reminders  Applies to: All patients receiving prescriptions (written and/or electronic).  Medication Rules & Regulations: These rules and regulations exist for your safety and that of others. They are not flexible and neither are we. Dismissing or ignoring them will be considered "non-compliance" with medication therapy, resulting in complete and irreversible termination of such therapy. (See document titled "Medication Rules" for more details.) In all conscience, because of safety reasons, we cannot continue providing a therapy where the patient does not follow instructions.  Pharmacy of record:   Definition: This is the pharmacy where your electronic prescriptions will be sent.   We do not endorse any  particular pharmacy, however, we have experienced problems with [facility] not securing enough medication supply for the community.  We do not restrict you in your choice of pharmacy. However, once we write for your prescriptions, we will NOT be re-sending more prescriptions to fix restricted supply problems created by your pharmacy, or your insurance.   The pharmacy listed in the electronic medical record should be the one where you want electronic prescriptions to be sent.  If you choose to change pharmacy, simply notify our nursing staff.  Recommendations:  Keep all of your pain medications in a safe place, under lock and key, even if you live alone. We will NOT replace lost, stolen, or   damaged medication.  After you fill your prescription, take 1 week's worth of pills and put them away in a safe place. You should keep a separate, properly labeled bottle for this purpose. The remainder should be kept in the original bottle. Use this as your primary supply, until it runs out. Once it's gone, then you know that you have 1 week's worth of medicine, and it is time to come in for a prescription refill. If you do this correctly, it is unlikely that you will ever run out of medicine.  To make sure that the above recommendation works, it is very important that you make sure your medication refill appointments are scheduled at least 1 week before you run out of medicine. To do this in an effective manner, make sure that you do not leave the office without scheduling your next medication management appointment. Always ask the nursing staff to show you in your prescription , when your medication will be running out. Then arrange for the receptionist to get you a return appointment, at least 7 days before you run out of medicine. Do not wait until you have 1 or 2 pills left, to come in. This is very poor planning and does not take into consideration that we may need to cancel appointments due to bad weather,  sickness, or emergencies affecting our staff.  DO NOT ACCEPT A "Partial Fill": If for any reason your pharmacy does not have enough pills/tablets to completely fill or refill your prescription, do not allow for a "partial fill". The law allows the pharmacy to complete that prescription within 72 hours, without requiring a new prescription. If they do not fill the rest of your prescription within those 72 hours, you will need a separate prescription to fill the remaining amount, which we will NOT provide. If the reason for the partial fill is your insurance, you will need to talk to the pharmacist about payment alternatives for the remaining tablets, but again, DO NOT ACCEPT A PARTIAL FILL, unless you can trust your pharmacist to obtain the remainder of the pills within 72 hours.  Prescription refills and/or changes in medication(s):   Prescription refills, and/or changes in dose or medication, will be conducted only during scheduled medication management appointments. (Applies to both, written and electronic prescriptions.)  No refills on procedure days. No medication will be changed or started on procedure days. No changes, adjustments, and/or refills will be conducted on a procedure day. Doing so will interfere with the diagnostic portion of the procedure.  No phone refills. No medications will be "called into the pharmacy".  No Fax refills.  No weekend refills.  No Holliday refills.  No after hours refills.  Remember:  Business hours are:  Monday to Thursday 8:00 AM to 4:00 PM Provider's Schedule: Milinda Pointer, MD - Appointments are:  Medication management: Monday and Wednesday 8:00 AM to 4:00 PM Procedure day: Tuesday and Thursday 7:30 AM to 4:00 PM Gillis Santa, MD - Appointments are:  Medication management: Tuesday and Thursday 8:00 AM to 4:00 PM Procedure day: Monday and Wednesday 7:30 AM to 4:00 PM (Last update:  11/17/2019) ____________________________________________________________________________________________   James [name] ALL OF THE MEDICATIONS THAT YOU ARE TAKING TO YOUR APPOINTMENTC ON Monday.  CONTINUE TO USE YOUR PTM AS INSTRUCTED.  CALL us FOR ANY FURTHER QUESTIONS        Opioid Overdose Opioids are drugs that are often used to treat pain. Opioids include illegal drugs, such as heroin, as well as prescription pain medicines,  such as codeine, morphine, hydrocodone, oxycodone, and fentanyl. An opioid overdose happens when you take too much of an opioid. An overdose may be intentional or accidental and can happen with any type of opioid. The effects of an overdose can be mild, dangerous, or even deadly. Opioid overdose is a medical emergency. What are the causes? This condition may be caused by:  Taking too much of an opioid on purpose.  Taking too much of an opioid by accident.  Using two or more substances that contain opioids at the same time.  Taking an opioid with a substance that affects your heart, breathing, or blood pressure. These include alcohol, tranquilizers, sleeping pills, illegal drugs, and some over-the-counter medicines. This condition may also happen due to an error made by:  A health care provider who prescribes a medicine.  The pharmacist who fills the prescription order. What increases the risk? This condition is more likely in:  Children. They may be attracted to colorful pills. Because of a child's small size, even a small amount of a drug can be dangerous.  Older people. They may be taking many different drugs. Older people may have difficulty reading labels or remembering when they last took their medicine. They may also be more sensitive to the effects of opioids.  People with chronic medical conditions, especially heart, liver, kidney, or neurological diseases.  People who take an opioid for a long period of time.  People who use: ? Illegal drugs. IV  heroin is especially dangerous. ? Other substances, including alcohol, while using an opioid.  People who have: ? A history of drug or alcohol abuse. ? Certain mental health conditions. ? A history of previous drug overdoses.  People who take opioids that are not prescribed for them. What are the signs or symptoms? Symptoms of this condition depend on the type of opioid and the amount that was taken. Common symptoms include:  Sleepiness or difficulty waking from sleep.  Decrease in attention.  Confusion.  Slurred speech.  Slowed breathing and a slow pulse (bradycardia).  Nausea and vomiting.  Abnormally small pupils. Signs and symptoms that require emergency treatment include:  Cold, clammy, and pale skin.  Blue lips and fingernails.  Vomiting.  Gurgling sounds in the throat.  A pulse that is very slow or difficult to detect.  Breathing that is very irregular, slow, noisy, or difficult to detect.  Limp body.  Inability to respond to speech or be awakened from sleep (stupor).  Seizures. How is this diagnosed? This condition is diagnosed based on your symptoms and medical history. It is important to tell your health care provider:  About all of the opioids that you took.  When you took the opioids.  Whether you were drinking alcohol or using marijuana, cocaine, or other drugs. Your health care provider will do a physical exam. This exam may include:  Checking and monitoring your heart rate and rhythm, breathing rate, temperature, and blood pressure (vital signs).  Measuring oxygen levels in your blood.  Checking for abnormally small pupils. You may also have blood tests or urine tests. You may have X-rays if you are having severe breathing problems. How is this treated? This condition requires immediate medical treatment and hospitalization. Treatment is given in the hospital intensive care (ICU) setting. Supporting your vital signs and your breathing is the  first step in treating an opioid overdose. Treatment may also include:  Giving salts and minerals (electrolytes) along with fluids through an IV.  Inserting a breathing  tube (endotracheal tube) in your airway to help you breathe if you cannot breathe on your own or you are in danger of not being able to breathe on your own.  Giving oxygen through a small tube under your nose.  Passing a tube through your nose and into your stomach (nasogastric tube, or NG tube) to empty your stomach.  Giving medicines that: ? Increase your blood pressure. ? Relieve nausea and vomiting. ? Relieve abdominal pain and cramping. ? Reverse the effects of the opioid (naloxone).  Monitoring your heart and oxygen levels.  Ongoing counseling and mental health support if you intentionally overdosed or used an illegal drug. Follow these instructions at home:  Medicines  Take over-the-counter and prescription medicines only as told by your health care provider.  Always ask your health care provider about possible side effects and interactions of any new medicine that you start taking.  Keep a list of all the medicines that you take, including over-the-counter medicines. Bring this list with you to all your medical visits. General instructions  Drink enough fluid to keep your urine pale yellow.  Keep all follow-up visits as told by your health care provider. This is important. How is this prevented?  Read the drug inserts that come with your opioid pain medicines.  Take medicines only as told by your health care provider. Do not take more medicine than you are told. Do not take medicines more frequently than you are told.  Do not drink alcohol or take sedatives when taking opioids.  Do not use illegal or recreational drugs, including cocaine, ecstasy, and marijuana.  Do not take opioid medicines that are not prescribed for you.  Store all medicines in safety containers that are out of the reach of  children.  Get help if you are struggling with: ? Alcohol or drug use. ? Depression or another mental health problem. ? Thoughts of hurting yourself or another person.  Keep the phone number of your local poison control center near your phone or in your mobile phone. In the [place] the hotline of the [facility] is 504-690-8397.  If you were prescribed naloxone, make sure you understand how to take it. Contact a health care provider if you:  Need help understanding how to take your pain medicines.  Feel your medicines are too strong.  Are concerned that your pain medicines are not working well for your pain.  Develop new symptoms or side effects when you are taking medicines. Get help right away if:  You or someone else is having symptoms of an opioid overdose. Get help even if you are not sure.  You have serious thoughts about hurting yourself or others.  You have: ? Chest pain. ? Difficulty breathing. ? A loss of consciousness. These symptoms may represent a serious problem that is an emergency. Do not wait to see if the symptoms will go away. Get medical help right away. Call your local emergency services (911 in the [place] Do not drive yourself to the hospital. If you ever feel like you may hurt yourself or others, or have thoughts about taking your own life, get help right away. You can go to your nearest emergency department or call:  Your local emergency services (911 in the [place]  A suicide crisis helpline, such as the [place] at 828-028-9478. This is open 24 hours a day. Summary  Opioids are drugs that are often used to treat pain. Opioids include illegal drugs, such  as heroin, as well as prescription pain medicines.  An opioid overdose happens when you take too much of an opioid.  Overdoses can be intentional or accidental.  Opioid overdose is very dangerous. It is a life-threatening emergency.  If you or  someone you know is experiencing an opioid overdose, get help right away. This information is not intended to replace advice given to you by your health care provider. Make sure you discuss any questions you have with your health care provider. Document Revised: 04/02/2018 Document Reviewed: 04/02/2018 Elsevier Patient Education  [place].  AS INSTRUCTED.

## 2020-03-09 NOTE — Progress Notes (Signed)
Safety precautions to be maintained throughout the outpatient stay will include: orient to surroundings, keep bed in low position, maintain call bell within reach at all times, provide assistance with transfer out of bed and ambulation.  

## 2020-03-10 ENCOUNTER — Telehealth: Payer: Self-pay

## 2020-03-10 NOTE — Telephone Encounter (Signed)
Post pump increase follow up. Patient states he is not noticing any changes in pain relief.

## 2020-03-12 NOTE — Progress Notes (Signed)
PROVIDER NOTE: Information contained herein reflects review and annotations entered in association with encounter. Interpretation of such information and data should be left to medically-trained personnel. Information provided to patient can be located elsewhere in the medical record under "Patient Instructions". Document created using STT-dictation technology, any transcriptional errors that may result from process are unintentional.    Patient: James [name]  Service Category: Procedure  Provider: Gaspar Cola, MD  DOB: 1950-06-29  DOS: 03/13/2020  Location: [facility]  MRN: [id number]  Setting: Ambulatory - outpatient  Referring Provider: [name], MD  Type: Established Patient  Specialty: Interventional Pain Management  PCP: [name], MD   Primary Reason for Visit: Interventional Pain Management Treatment. CC: Procedure (pump refill) and Medication Refill  Procedure:          Intrathecal Drug Delivery System (IDDS):  Type: Analysis & Programming [phone]) Today we have increased the rate of infusion to match the combination of the simple continuous infusion in the PCA mode. Region: Abdominal Laterality: Left  Type of Pump: Medtronic Synchromed II Delivery Route: Intrathecal Type of Pain Treated: Neuropathic/Nociceptive Primary Medication Class: Opioid/opiate  Medication, Concentration, Infusion Program, & Delivery Rate: Please see scanned programming printout.   Indications: 1. Chronic pain syndrome   2. Cancer-related pain   3. Chronic abdominal pain   4. Abdominal wall pain in epigastric region   5. CLL (chronic lymphocytic leukemia) ([place])   6. Generalized abdominal pain   7. Malignant lymphoma, small lymphocytic ([place])   8. Physical tolerance to opiate drug   9. Presence of implanted infusion pump (Medtronic)   10. Presence of intrathecal pump (Medtronic)   11. Encounter for adjustment and management of infusion pump    Pain  Assessment: Self-Reported Pain Score: 6 /10             Reported level is compatible with observation.        Pharmacotherapy Assessment  Analgesic: Oxycodone IR 10mg , 1 tab PO q 4 hrs (60 mg/day of oxycodone IR) (90 MME) + morphine ER 30 mg twice daily (60 mg/day of morphine ER) (60 MME) (150 MME/day). Highest recorded MME/day: 150 mg/day 90 day average MME/day: 77.89 mg/day   Monitoring: [place] PMP: PDMP reviewed during this encounter.       Pharmacotherapy: No side-effects or adverse reactions reported. Compliance: No problems identified. Effectiveness: Clinically acceptable. Plan: Refer to "POC".  UDS: No results found for: SUMMARY  Intrathecal Pump Therapy Assessment  Manufacturer: Medtronic Synchromed Type: Programmable Volume: 40 mL reservoir MRI compatibility: Yes   Drug content:  Primary Medication Class:Opioid Primary Medication:PF-Fentanyl(2000 mcg/mL) Secondary Medication:PF-Bupivacaine(20 mg/mL) Other Medication:None   PTM (PCA) mode:  PTM dose:25 mcg bolus Duration of bolus:1 minute Lockout interval:6 hours Maximum 24-hour doses:4  Programming:  Type: Simple continuous. See pump readout for details.   Changes:  Medication Change: None at this point Rate Change: Today we have reviewed the programming on his intrathecal pump and he has been using his PCA mode to the maximum meaning that he had a total of 400 mcg/day, and he is still complaining of insufficient analgesia.  For this reason, today we have increased his basal rate to 400 mcg/day and we have kept the PCA programming the same.  With that combination, he should be able to get a maximum of 500 mcg/day of fentanyl and 5 mg/day of bupivacaine.  Reported side-effects or adverse reactions: None reported  Effectiveness: Described as ineffective and would like to make some changes Clinically meaningful  improvement in function (CMIF): Medication does not meet basic CMIF  Plan: Analysis and  programming  Pre-op Assessment:  Mr. Hodes is a 69 y.o. (year old), male patient, seen today for interventional treatment. He  has a past surgical history that includes Cholecystectomy (1983); Esophagogastroduodenoscopy (egd) with propofol (N/A, 11/20/2016); Cataract extraction w/ intraocular lens implant (Bilateral); Lower Extremity Angiography (Right, 05/19/2017); Esophagogastroduodenoscopy (egd) with propofol (N/A, 10/27/2017); Colonoscopy with propofol (N/A, 10/27/2017); Lower Extremity Angiography (Left, 08/10/2018); and Intrathecal pump implant (Left, 02/07/2020). Mr. Dwan has a current medication list which includes the following prescription(s): atorvastatin, clopidogrel, ensure, folic acid, metformin, methocarbamol, naloxegol oxalate, [START ON 05/18/2020] PAIN MANAGEMENT IT PUMP REFILL, pantoprazole, sucralfate, esomeprazole, naloxone, and oxycodone hcl. His primarily concern today is the Procedure (pump refill) and Medication Refill  Initial Vital Signs:  Pulse/HCG Rate: (!) 119  Temp: 98.5 F (36.9 C) Resp: 18 BP: (!) 152/79 SpO2:    BMI: Estimated body mass index is 22.05 kg/m as calculated from the following:   Height as of this encounter: 5\' 8"  (1.727 m).   Weight as of this encounter: 145 lb (65.8 kg).  Risk Assessment: Allergies: Reviewed. He has No Known Allergies.  Allergy Precautions: None required Coagulopathies: Reviewed. None identified.  Blood-thinner therapy: None at this time Active Infection(s): Reviewed. None identified. Mr. Baugher is afebrile  Site Confirmation: Mr. Boughner was asked to confirm the procedure and laterality before marking the site Procedure checklist: Completed Consent: Before the procedure and under the influence of no sedative(s), amnesic(s), or anxiolytics, the patient was informed of the treatment options, risks and possible complications. To fulfill our ethical and legal obligations, as recommended by the American Medical Association's Code of  Ethics, I have informed the patient of my clinical impression; the nature and purpose of the treatment or procedure; the risks, benefits, and possible complications of the intervention; the alternatives, including doing nothing; the risk(s) and benefit(s) of the alternative treatment(s) or procedure(s); and the risk(s) and benefit(s) of doing nothing.  Mr. Musick was provided with information about the general risks and possible complications associated with most interventional procedures. These include, but are not limited to: failure to achieve desired goals, infection, bleeding, organ or nerve damage, allergic reactions, paralysis, and/or death.  In addition, he was informed of those risks and possible complications associated to this particular procedure, which include, but are not limited to: damage to the implant; failure to decrease pain; local, systemic, or serious CNS infections, intraspinal abscess with possible cord compression and paralysis, or life-threatening such as meningitis; bleeding; organ damage; nerve injury or damage with subsequent sensory, motor, and/or autonomic system dysfunction, resulting in transient or permanent pain, numbness, and/or weakness of one or several areas of the body; allergic reactions, either minor or major life-threatening, such as anaphylactic or anaphylactoid reactions.  Furthermore, Mr. Couts was informed of those risks and complications associated with the medications. These include, but are not limited to: allergic reactions (i.e.: anaphylactic or anaphylactoid reactions); endorphine suppression; bradycardia and/or hypotension; water retention and/or peripheral vascular relaxation leading to lower extremity edema and possible stasis ulcers; respiratory depression and/or shortness of breath; decreased metabolic rate leading to weight gain; swelling or edema; medication-induced neural toxicity; particulate matter embolism and blood vessel occlusion with resultant  organ, and/or nervous system infarction; and/or intrathecal granuloma formation with possible spinal cord compression and permanent paralysis.  Before refilling the pump Mr. Mcdiarmid was informed that some of the medications used in the devise may not be FDA approved for such  use and therefore it constitutes an off-label use of the medications.  Finally, he was informed that Medicine is not an exact science; therefore, there is also the possibility of unforeseen or unpredictable risks and/or possible complications that may result in a catastrophic outcome. The patient indicated having understood very clearly. We have given the patient no guarantees and we have made no promises. Enough time was given to the patient to ask questions, all of which were answered to the patient's satisfaction. Mr. Pantano has indicated that he wanted to continue with the procedure. Attestation: I, the ordering provider, attest that I have discussed with the patient the benefits, risks, side-effects, alternatives, likelihood of achieving goals, and potential problems during recovery for the procedure that I have provided informed consent. Date   Time: 03/13/2020 10:36 AM  Pre-Procedure Preparation:  Monitoring: As per clinic protocol. Respiration, ETCO2, SpO2, BP, heart rate and rhythm monitor placed and checked for adequate function Safety Precautions: Patient was assessed for positional comfort and pressure points before starting the procedure. Time-out: I initiated and conducted the "Time-out" before starting the procedure, as per protocol. The patient was asked to participate by confirming the accuracy of the "Time Out" information. Verification of the correct person, site, and procedure were performed and confirmed by me, the nursing staff, and the patient. "Time-out" conducted as per Joint Commission's Universal Protocol (UP.01.01.01). Time:    Description of Procedure:          Position: Supine Target Area: Area over the  pump implant. Approach: Radiofrequency communication between antenna and programmable pump. Area Prepped: Not required for simple analysis and programming N/A Safety Precautions: At least two trained healthcare practitioners present to verify programming and calculations. Description of the Procedure: Protocol guidelines were followed.      The pump was interrogated and programmed to reflect the correct medication, volume, and dosage. The program was printed and taken to the physician for approval. Once checked and signed by the physician, a copy was provided to the patient and another scanned into the EMR.  Vitals:   03/13/20 1035 03/13/20 1038  BP:  (!) 152/79  Pulse:  (!) 119  Resp:  18  Temp:  98.5 F (36.9 C)  Weight: 145 lb (65.8 kg) 145 lb (65.8 kg)  Height: 5\' 8"  (1.727 m) 5\' 8"  (1.727 m)   Materials & Medications: Medtronic Programmer Medication(s): Please see chart orders for details.  Imaging Guidance:          Type of Imaging Technique: None used Indication(s): N/A Exposure Time: No patient exposure Contrast: None used. Fluoroscopic Guidance: N/A Ultrasound Guidance: N/A Interpretation: N/A  Antibiotic Prophylaxis:   Anti-infectives (From admission, onward)   None     Indication(s): None identified  Post-operative Assessment:  Post-procedure Vital Signs:  Pulse/HCG Rate: (!) 119  Temp: 98.5 F (36.9 C) Resp: 18 BP: (!) 152/79 SpO2:    EBL: None  Complications: No immediate post-treatment complications observed by team, or reported by patient.  Note: The patient tolerated the entire procedure well. A repeat set of vitals were taken after the procedure and the patient was kept under observation following institutional policy, for this type of procedure. Post-procedural neurological assessment was performed, showing return to baseline, prior to discharge. The patient was provided with post-procedure discharge instructions, including a section on how to  identify potential problems. Should any problems arise concerning this procedure, the patient was given instructions to immediately contact us, at any time, without hesitation. In any case, we  plan to contact the patient by telephone for a follow-up status report regarding this interventional procedure.  Comments:  No additional relevant information.  Plan of Care  Orders:  Orders Placed This Encounter  Procedures   PUMP REPROGRAM    Follow programming protocol by having two(2) healthcare providers present during programming.    Scheduling Instructions:     Please perform the following adjustment: Increase basal rate to 400 g/day.    Order Specific Question:   Where will this procedure be performed?    Answer:   [facility]   Chronic Opioid Analgesic:  Oxycodone IR 10mg , 1 tab PO q 4 hrs (60 mg/day of oxycodone IR) (90 MME) + morphine ER 30 mg twice daily (60 mg/day of morphine ER) (60 MME) (150 MME/day). Highest recorded MME/day: 150 mg/day 90 day average MME/day: 77.89 mg/day   Medications ordered for procedure: Meds ordered this encounter  Medications   sucralfate (CARAFATE) 1 g tablet    Sig: Take 1 tablet (1 g total) by mouth 4 (four) times daily -  with meals and at bedtime.    Dispense:  120 tablet    Refill:  2   esomeprazole (NEXIUM) 40 MG capsule    Sig: Take 1 capsule (40 mg total) by mouth daily.    Dispense:  30 capsule    Refill:  1   Oxycodone HCl 10 MG TABS    Sig: Take 1 tablet (10 mg total) by mouth every 4 (four) hours as needed for up to 10 days. Maximum of 6/day.    Dispense:  60 tablet    Refill:  0   Medications administered: Chinedum F. Monjaras had no medications administered during this visit.  See the medical record for exact dosing, route, and time of administration.  Follow-up plan:   Return in about 2 days (around 03/15/2020) for (F2F) for pump adjustment.       Interventional treatment options: Planned, scheduled, and/or pending:     NOTE: PLAVIX ANTICOAGULATION  (Stop: 7 days   Restart: 2 hrs)    Under consideration:   Diagnostic intrathecal injection of opioid analgesic (intrathecal pump trial).   Therapeutic/palliative (PRN):   Diagnostic bilateral celiac plexus block #3  (patient indicated no relief)    Recent Visits Date Type Provider Dept  03/09/20 Procedure visit Milinda Pointer, MD [facility]  03/06/20 Procedure visit Milinda Pointer, MD [facility]  03/02/20 Procedure visit Milinda Pointer, MD [facility]  12/21/19 Telemedicine Milinda Pointer, MD [facility]  12/14/19 Procedure visit Milinda Pointer, MD [facility]  Showing recent visits within past 90 days and meeting all other requirements Today's Visits Date Type Provider Dept  03/13/20 Procedure visit Milinda Pointer, MD [facility]  Showing today's visits and meeting all other requirements Future Appointments Date Type Provider Dept  03/21/20 Appointment Milinda Pointer, [facility]  06/01/20 Appointment Milinda Pointer, MD [facility]  Showing future appointments within next 90 days and meeting all other requirements  Disposition: Discharge home  Discharge (Date   Time): 03/13/2020; 1113 hrs.   Primary Care Physician: [name], MD Location: [facility] Note by: Gaspar Cola, MD Date: 03/13/2020; Time: 4:17 PM  Disclaimer:  Medicine is not an Chief Strategy Officer. The only guarantee in medicine is that nothing is guaranteed. It is important to note that the decision to proceed with this intervention was based on the information collected from the patient. The Data  and conclusions were drawn from the patient's questionnaire, the interview, and the physical examination. Because the information was provided in large part by the patient, it cannot be guaranteed that it has not been purposely or  unconsciously manipulated. Every effort has been made to obtain as much relevant data as possible for this evaluation. It is important to note that the conclusions that lead to this procedure are derived in large part from the available data. Always take into account that the treatment will also be dependent on availability of resources and existing treatment guidelines, considered by other Pain Management Practitioners as being common knowledge and practice, at the time of the intervention. For Medico-Legal purposes, it is also important to point out that variation in procedural techniques and pharmacological choices are the acceptable norm. The indications, contraindications, technique, and results of the above procedure should only be interpreted and judged by a Board-Certified Interventional Pain Specialist with extensive familiarity and expertise in the same exact procedure and technique.

## 2020-03-13 ENCOUNTER — Inpatient Hospital Stay: Payer: Medicare Other

## 2020-03-13 ENCOUNTER — Telehealth: Payer: Self-pay

## 2020-03-13 ENCOUNTER — Ambulatory Visit: Payer: Medicare Other | Attending: Pain Medicine | Admitting: Pain Medicine

## 2020-03-13 ENCOUNTER — Other Ambulatory Visit: Payer: Self-pay

## 2020-03-13 ENCOUNTER — Encounter: Payer: Self-pay | Admitting: Pain Medicine

## 2020-03-13 VITALS — BP 152/79 | HR 119 | Temp 98.5°F | Resp 18 | Ht 68.0 in | Wt 145.0 lb

## 2020-03-13 DIAGNOSIS — Z79891 Long term (current) use of opiate analgesic: Secondary | ICD-10-CM | POA: Insufficient documentation

## 2020-03-13 DIAGNOSIS — Z7902 Long term (current) use of antithrombotics/antiplatelets: Secondary | ICD-10-CM | POA: Insufficient documentation

## 2020-03-13 DIAGNOSIS — Z451 Encounter for adjustment and management of infusion pump: Secondary | ICD-10-CM

## 2020-03-13 DIAGNOSIS — C8590 Non-Hodgkin lymphoma, unspecified, unspecified site: Secondary | ICD-10-CM | POA: Insufficient documentation

## 2020-03-13 DIAGNOSIS — Z7984 Long term (current) use of oral hypoglycemic drugs: Secondary | ICD-10-CM | POA: Diagnosis not present

## 2020-03-13 DIAGNOSIS — R1084 Generalized abdominal pain: Secondary | ICD-10-CM

## 2020-03-13 DIAGNOSIS — G893 Neoplasm related pain (acute) (chronic): Secondary | ICD-10-CM | POA: Diagnosis not present

## 2020-03-13 DIAGNOSIS — Z9049 Acquired absence of other specified parts of digestive tract: Secondary | ICD-10-CM | POA: Insufficient documentation

## 2020-03-13 DIAGNOSIS — Z79899 Other long term (current) drug therapy: Secondary | ICD-10-CM | POA: Diagnosis not present

## 2020-03-13 DIAGNOSIS — K219 Gastro-esophageal reflux disease without esophagitis: Secondary | ICD-10-CM

## 2020-03-13 DIAGNOSIS — G894 Chronic pain syndrome: Secondary | ICD-10-CM | POA: Diagnosis not present

## 2020-03-13 DIAGNOSIS — R1013 Epigastric pain: Secondary | ICD-10-CM | POA: Diagnosis not present

## 2020-03-13 DIAGNOSIS — C9110 Chronic lymphocytic leukemia of B-cell type not having achieved remission: Secondary | ICD-10-CM | POA: Diagnosis not present

## 2020-03-13 DIAGNOSIS — C8300 Small cell B-cell lymphoma, unspecified site: Secondary | ICD-10-CM

## 2020-03-13 DIAGNOSIS — Z95828 Presence of other vascular implants and grafts: Secondary | ICD-10-CM

## 2020-03-13 DIAGNOSIS — Z978 Presence of other specified devices: Secondary | ICD-10-CM

## 2020-03-13 DIAGNOSIS — G8929 Other chronic pain: Secondary | ICD-10-CM

## 2020-03-13 DIAGNOSIS — F1190 Opioid use, unspecified, uncomplicated: Secondary | ICD-10-CM

## 2020-03-13 MED ORDER — OXYCODONE HCL 10 MG PO TABS: 10.0000 mg | ORAL_TABLET | ORAL | 0 refills | Status: DC | PRN

## 2020-03-13 MED ORDER — ESOMEPRAZOLE MAGNESIUM 40 MG PO CPDR: 40.0000 mg | DELAYED_RELEASE_CAPSULE | Freq: Every day | ORAL | 1 refills | Status: DC

## 2020-03-13 MED ORDER — SUCRALFATE 1 G PO TABS: 1.0000 g | ORAL_TABLET | Freq: Three times a day (TID) | ORAL | 2 refills | Status: DC

## 2020-03-13 NOTE — Progress Notes (Signed)
Nutrition Follow-up:  Patient with stage IV CLL.  Patient receiving gazyva.  Noted placement of pain pump on 02/07/20.    RD was able to speak with patient by phone at 3:30pm today.  Patient reports has only drank 1 ensure and cup of milk today.  Reports thinks pain is causing him not to have appetite.      Medications: reviewed  Labs: reviewed  Anthropometrics:   Weight 145 lb noted today (pain clinic wt) decreased from 162 lb 9.6 oz on 10/6   NUTRITION DIAGNOSIS: Inadequate oral intake continues   INTERVENTION:  Will provide complimentary case of ensure for patient to pick up tomorrow at visit.  Encouraged "nibbles" during the day.   Patient ended conversation quickly with saying he needed to go adjust medication in pain pump.   Patient may benefit from trial of appetite stimulant if weight continues to decline    NEXT VISIT: as needed  James [name], [place], [place] Registered Dietitian [phone] (mobile)

## 2020-03-13 NOTE — Progress Notes (Signed)
Safety precautions to be maintained throughout the outpatient stay will include: orient to surroundings, keep bed in low position, maintain call bell within reach at all times, provide assistance with transfer out of bed and ambulation.   Nursing Pain Medication Assessment:  Safety precautions to be maintained throughout the outpatient stay will include: orient to surroundings, keep bed in low position, maintain call bell within reach at all times, provide assistance with transfer out of bed and ambulation.  Medication Inspection Compliance: Pill count conducted under aseptic conditions, in front of the patient. Neither the pills nor the bottle was removed from the patient's sight at any time. Once count was completed pills were immediately returned to the patient in their original bottle.  Medication: Oxycodone IR Pill/Patch Count: No pills available to be counted. Pill/Patch Appearance: Markings consistent with prescribed medication Bottle Appearance: Standard pharmacy container. Clearly labeled. Filled Date: 29 / 05 / 2021 Last Medication intake:  Day before yesterday

## 2020-03-13 NOTE — Telephone Encounter (Signed)
Nutrition  Unable to reach patient by phone at scheduled phone nutrition follow-up visit.  Spoke with wife and reports at another appointment.  Lowella Kindley B. Zenia Resides, [place], [place] Registered Dietitian [phone] (mobile)

## 2020-03-13 NOTE — Patient Instructions (Signed)
Opioid Overdose Opioids are drugs that are often used to treat pain. Opioids include illegal drugs, such as heroin, as well as prescription pain medicines, such as codeine, morphine, hydrocodone, oxycodone, and fentanyl. An opioid overdose happens when you take too much of an opioid. An overdose may be intentional or accidental and can happen with any type of opioid. The effects of an overdose can be mild, dangerous, or even deadly. Opioid overdose is a medical emergency. What are the causes? This condition may be caused by:  Taking too much of an opioid on purpose.  Taking too much of an opioid by accident.  Using two or more substances that contain opioids at the same time.  Taking an opioid with a substance that affects your heart, breathing, or blood pressure. These include alcohol, tranquilizers, sleeping pills, illegal drugs, and some over-the-counter medicines. This condition may also happen due to an error made by:  A health care provider who prescribes a medicine.  The pharmacist who fills the prescription order. What increases the risk? This condition is more likely in:  Children. They may be attracted to colorful pills. Because of a child's small size, even a small amount of a drug can be dangerous.  Older people. They may be taking many different drugs. Older people may have difficulty reading labels or remembering when they last took their medicine. They may also be more sensitive to the effects of opioids.  People with chronic medical conditions, especially heart, liver, kidney, or neurological diseases.  People who take an opioid for a long period of time.  People who use: ? Illegal drugs. IV heroin is especially dangerous. ? Other substances, including alcohol, while using an opioid.  People who have: ? A history of drug or alcohol abuse. ? Certain mental health conditions. ? A history of previous drug overdoses.  People who take opioids that are not prescribed  for them. What are the signs or symptoms? Symptoms of this condition depend on the type of opioid and the amount that was taken. Common symptoms include:  Sleepiness or difficulty waking from sleep.  Decrease in attention.  Confusion.  Slurred speech.  Slowed breathing and a slow pulse (bradycardia).  Nausea and vomiting.  Abnormally small pupils. Signs and symptoms that require emergency treatment include:  Cold, clammy, and pale skin.  Blue lips and fingernails.  Vomiting.  Gurgling sounds in the throat.  A pulse that is very slow or difficult to detect.  Breathing that is very irregular, slow, noisy, or difficult to detect.  Limp body.  Inability to respond to speech or be awakened from sleep (stupor).  Seizures. How is this diagnosed? This condition is diagnosed based on your symptoms and medical history. It is important to tell your health care provider:  About all of the opioids that you took.  When you took the opioids.  Whether you were drinking alcohol or using marijuana, cocaine, or other drugs. Your health care provider will do a physical exam. This exam may include:  Checking and monitoring your heart rate and rhythm, breathing rate, temperature, and blood pressure (vital signs).  Measuring oxygen levels in your blood.  Checking for abnormally small pupils. You may also have blood tests or urine tests. You may have X-rays if you are having severe breathing problems. How is this treated? This condition requires immediate medical treatment and hospitalization. Treatment is given in the hospital intensive care (ICU) setting. Supporting your vital signs and your breathing is the first step in  treating an opioid overdose. Treatment may also include:  Giving salts and minerals (electrolytes) along with fluids through an IV.  Inserting a breathing tube (endotracheal tube) in your airway to help you breathe if you cannot breathe on your own or you are in  danger of not being able to breathe on your own.  Giving oxygen through a small tube under your nose.  Passing a tube through your nose and into your stomach (nasogastric tube, or NG tube) to empty your stomach.  Giving medicines that: ? Increase your blood pressure. ? Relieve nausea and vomiting. ? Relieve abdominal pain and cramping. ? Reverse the effects of the opioid (naloxone).  Monitoring your heart and oxygen levels.  Ongoing counseling and mental health support if you intentionally overdosed or used an illegal drug. Follow these instructions at home:  Medicines  Take over-the-counter and prescription medicines only as told by your health care provider.  Always ask your health care provider about possible side effects and interactions of any new medicine that you start taking.  Keep a list of all the medicines that you take, including over-the-counter medicines. Bring this list with you to all your medical visits. General instructions  Drink enough fluid to keep your urine pale yellow.  Keep all follow-up visits as told by your health care provider. This is important. How is this prevented?  Read the drug inserts that come with your opioid pain medicines.  Take medicines only as told by your health care provider. Do not take more medicine than you are told. Do not take medicines more frequently than you are told.  Do not drink alcohol or take sedatives when taking opioids.  Do not use illegal or recreational drugs, including cocaine, ecstasy, and marijuana.  Do not take opioid medicines that are not prescribed for you.  Store all medicines in safety containers that are out of the reach of children.  Get help if you are struggling with: ? Alcohol or drug use. ? Depression or another mental health problem. ? Thoughts of hurting yourself or another person.  Keep the phone number of your local poison control center near your phone or in your mobile phone. In the  [place] the hotline of the National Poison Control Center is (800) 222-1222.  If you were prescribed naloxone, make sure you understand how to take it. Contact a health care provider if you:  Need help understanding how to take your pain medicines.  Feel your medicines are too strong.  Are concerned that your pain medicines are not working well for your pain.  Develop new symptoms or side effects when you are taking medicines. Get help right away if:  You or someone else is having symptoms of an opioid overdose. Get help even if you are not sure.  You have serious thoughts about hurting yourself or others.  You have: ? Chest pain. ? Difficulty breathing. ? A loss of consciousness. These symptoms may represent a serious problem that is an emergency. Do not wait to see if the symptoms will go away. Get medical help right away. Call your local emergency services (911 in the [place] Do not drive yourself to the hospital. If you ever feel like you may hurt yourself or others, or have thoughts about taking your own life, get help right away. You can go to your nearest emergency department or call:  Your local emergency services (911 in the [place]  A suicide crisis helpline, such as the National Suicide Prevention Lifeline at   1-800-273-8255. This is open 24 hours a day. Summary  Opioids are drugs that are often used to treat pain. Opioids include illegal drugs, such as heroin, as well as prescription pain medicines.  An opioid overdose happens when you take too much of an opioid.  Overdoses can be intentional or accidental.  Opioid overdose is very dangerous. It is a life-threatening emergency.  If you or someone you know is experiencing an opioid overdose, get help right away. This information is not intended to replace advice given to you by your health care provider. Make sure you discuss any questions you have with your health care provider. Document Revised: 04/02/2018 Document  Reviewed: 04/02/2018 [employer] Patient Education  2020 [employer]  

## 2020-03-14 ENCOUNTER — Inpatient Hospital Stay (HOSPITAL_BASED_OUTPATIENT_CLINIC_OR_DEPARTMENT_OTHER): Payer: Medicare Other | Admitting: Hospice and Palliative Medicine

## 2020-03-14 ENCOUNTER — Inpatient Hospital Stay: Payer: Medicare Other

## 2020-03-14 VITALS — BP 123/71 | HR 82

## 2020-03-14 DIAGNOSIS — Z515 Encounter for palliative care: Secondary | ICD-10-CM

## 2020-03-14 DIAGNOSIS — N183 Chronic kidney disease, stage 3 unspecified: Secondary | ICD-10-CM

## 2020-03-14 DIAGNOSIS — N189 Chronic kidney disease, unspecified: Secondary | ICD-10-CM | POA: Diagnosis not present

## 2020-03-14 DIAGNOSIS — C9110 Chronic lymphocytic leukemia of B-cell type not having achieved remission: Secondary | ICD-10-CM | POA: Diagnosis not present

## 2020-03-14 LAB — CBC WITH DIFFERENTIAL/PLATELET
Abs Immature Granulocytes: 0.03 10*3/uL (ref 0.00–0.07)
Basophils Absolute: 0.1 10*3/uL (ref 0.0–0.1)
Basophils Relative: 1 %
Eosinophils Absolute: 0.1 10*3/uL (ref 0.0–0.5)
Eosinophils Relative: 1 %
HCT: 29.3 % — ABNORMAL LOW (ref 39.0–52.0)
Hemoglobin: 9.3 g/dL — ABNORMAL LOW (ref 13.0–17.0)
Immature Granulocytes: 0 %
Lymphocytes Relative: 31 %
Lymphs Abs: 2.3 10*3/uL (ref 0.7–4.0)
MCH: 33.9 pg (ref 26.0–34.0)
MCHC: 31.7 g/dL (ref 30.0–36.0)
MCV: 106.9 fL — ABNORMAL HIGH (ref 80.0–100.0)
Monocytes Absolute: 0.7 10*3/uL (ref 0.1–1.0)
Monocytes Relative: 9 %
Neutro Abs: 4.3 10*3/uL (ref 1.7–7.7)
Neutrophils Relative %: 58 %
Platelets: 179 10*3/uL (ref 150–400)
RBC: 2.74 MIL/uL — ABNORMAL LOW (ref 4.22–5.81)
RDW: 15.5 % (ref 11.5–15.5)
WBC: 7.5 10*3/uL (ref 4.0–10.5)
nRBC: 0 % (ref 0.0–0.2)

## 2020-03-14 LAB — BASIC METABOLIC PANEL
Anion gap: 9 (ref 5–15)
BUN: 31 mg/dL — ABNORMAL HIGH (ref 8–23)
CO2: 27 mmol/L (ref 22–32)
Calcium: 9 mg/dL (ref 8.9–10.3)
Chloride: 100 mmol/L (ref 98–111)
Creatinine, Ser: 1.13 mg/dL (ref 0.61–1.24)
GFR, Estimated: >60 mL/min (ref >60)
Glucose, Bld: 140 mg/dL — ABNORMAL HIGH (ref 70–99)
Potassium: 4.7 mmol/L (ref 3.5–5.1)
Sodium: 136 mmol/L (ref 135–145)

## 2020-03-14 MED ORDER — DARBEPOETIN ALFA 300 MCG/0.6ML IJ SOSY
300.0000 ug | PREFILLED_SYRINGE | Freq: Once | INTRAMUSCULAR | Status: AC
  Administered 2020-03-14: 300 ug via SUBCUTANEOUS
  Filled 2020-03-14: qty 0.6

## 2020-03-14 NOTE — Progress Notes (Signed)
Virtual Visit via Telephone Note  I connected with James [name] on 03/14/20 at 11:30 AM EST by telephone and verified that I am speaking with the correct person using two identifiers.  Location: Patient: Home Provider: Clinic   I discussed the limitations, risks, security and privacy concerns of performing an evaluation and management service by telephone and the availability of in person appointments. I also discussed with the patient that there may be a patient responsible charge related to this service. The patient expressed understanding and agreed to proceed.   History of Present Illness: James [name] a 69 y.o.malewith multiple medical problems including stage IV CLL (initially diagnosed in 2006) most recently treated with ibrutinib but was discontinued due to poor tolerance. He is now on treatment with Gazyva. Patient has had severe and chronic abdominal pain of unclear etiology. He has had extensive work-up including referral to [facility]. He has been followed by interventionalpainmanagement and is status post celiac plexus blocks and insertion of intrathecal pain pump without significant relief in symptoms. He was referred to palliative care to help address goals and manage ongoing symptoms.   Observations/Objective: I called and spoke with patient by phone.  He reports no significant changes or concerns today.  He continues to endorse chronic abdominal pain.  Patient is followed by Dr. Dossie Arbour and pain clinic.  He is status post intrathecal pain pump.  Patient reports that pain has not significantly changed after this procedure.  Patient does understand that pain medications are now being managed exclusively by pain management clinic.  Assessment and Plan: Stage IV CLL -on active treatment.  Followed by Dr. Rogue Bussing  Chronic pain -managed by Dr. Dossie Arbour  Follow Up Instructions: Follow-up virtual visit 2-3 months   I discussed the assessment and treatment plan with  the patient. The patient was provided an opportunity to ask questions and all were answered. The patient agreed with the plan and demonstrated an understanding of the instructions.   The patient was advised to call back or seek an in-person evaluation if the symptoms worsen or if the condition fails to improve as anticipated.  I provided 5 minutes of non-face-to-face time during this encounter.   Irean Hong, NP

## 2020-03-20 NOTE — Progress Notes (Signed)
PROVIDER NOTE: Information contained herein reflects review and annotations entered in association with encounter. Interpretation of such information and data should be left to medically-trained personnel. Information provided to patient can be located elsewhere in the medical record under "Patient Instructions". Document created using STT-dictation technology, any transcriptional errors that may result from process are unintentional.    Patient: James [name]  Service Category: Procedure  Provider: Gaspar Cola, MD  DOB: 04-16-1951  DOS: 03/21/2020  Location: [facility]  MRN: [id number]  Setting: Ambulatory - outpatient  Referring Provider: [name], MD  Type: Established Patient  Specialty: Interventional Pain Management  PCP: [name], MD   Primary Reason for Visit: Interventional Pain Management Treatment. CC: Abdominal Pain  Procedure:          Intrathecal Drug Delivery System (IDDS):  Type: Analysis & Programming [phone]) 37.7% increase in rate. Region: Abdominal Laterality: Left  Type of Pump: Medtronic Synchromed II Delivery Route: Intrathecal Type of Pain Treated: Neuropathic/Nociceptive Primary Medication Class: Opioid/opiate  Medication, Concentration, Infusion Program, & Delivery Rate: Please see scanned programming printout.   Indications: 1. Chronic pain syndrome   2. Cancer-related pain   3. Chronic abdominal pain   4. Abdominal wall pain in epigastric region   5. CLL (chronic lymphocytic leukemia) ([place])   6. Generalized abdominal pain   7. Malignant lymphoma, small lymphocytic ([place])   8. Physical tolerance to opiate drug   9. Presence of implanted infusion pump (Medtronic)   10. Presence of intrathecal pump (Medtronic)   11. Encounter for adjustment and management of infusion pump   12. Pharmacologic therapy   13. Other intervertebral disc degeneration, thoracic region    Pain Assessment: Self-Reported Pain Score: 7 /10              Reported level is compatible with observation.         On the patient's last visit on 03/13/2020 he was started on sucralfate (Carafate) 1 g tablet, 1 tab p.o. 4 times daily with meals and at bedtime.  He was also started on esomeprazole (Nexium) 40 mg capsule, 1 cap p.o. daily and he was given a refill on his oxycodone 10 mg tablet for breakthrough pain.  According to the PMP he had that prescription filled on the same date (03/13/2020).  I contacted this patient's oncologist and spoke to him about the pain and its etiology, and they indicate that they do not think that the pain is secondary to the abdominal findings from his cancer.  The patient indicates that he has also seen a gastroenterologist who is also not sure where this pain may be coming from.  The patient indicates that he has taken the Carafate in the past as well as the Nexium and neither 1 get rid of his pain.  I told him to continue taking it just in case this has something to do with the GI tract and the lining of the stomach.  Today he returns indicating that he is still having pain however, he does not look in the same kind of distress as he has been in the past.  He has been using his oral oxycodone IR every 4-6 hours and today he has only 10 left from the last prescription.  Today's pump programming reveals that he has also been using the PCA mode on the pump on a regular basis and he has maxed out meaning that he has been getting an additional 100 mcg/day of the fentanyl in addition  to his basal rate.  Today we will be adding that and taking into consideration his oral use and the patient's description of the pain and how the therapy is going to increase the pump's basal rate to approximately 550 mcg/day of fentanyl.  This should also be provided the patient with approximately 5.5 mg/day of bupivacaine.  We will not make any changes to his PCA mode and therefore he has the ability to add to that another 100 mcg/day of fentanyl  and 1.0 mg/day of bupivacaine.  In addition, I will be renewing his oral oxycodone.  I will have him return on Monday at which time we will reassess and adjust the pump programming as needed.  In addition to the above, today I will be ordering some x-rays of his lumbar and thoracic spine in an effort to evaluate the tip of the catheter to make sure that it has not migrated.  In addition, I will be ordering an MRI of thoracic spine to see if there is any possible reason why he is having this pain over the T5/T6 epigastric area.  Pharmacotherapy Assessment  Analgesic: Oxycodone IR 2m, 1 tab PO q 4 hrs (60 mg/day of oxycodone IR) (90 MME) + morphine ER 30 mg twice daily (60 mg/day of morphine ER) (60 MME) (150 MME/day). Highest recorded MME/day: 150 mg/day 90 day average MME/day: 77.89 mg/day   Monitoring: [place] PMP: PDMP reviewed during this encounter.       Pharmacotherapy: No side-effects or adverse reactions reported. Compliance: No problems identified. Effectiveness: Clinically acceptable. Plan: Refer to "POC".  UDS: No results found for: SUMMARY  Intrathecal Pump Therapy Assessment  Manufacturer: Medtronic Synchromed Type: Programmable Volume: 40 mL reservoir MRI compatibility: Yes   Drug content:  Primary Medication Class:Opioid Primary Medication:PF-Fentanyl(2000 mcg/mL) Secondary Medication:PF-Bupivacaine(20 mg/mL) Other Medication:None  PTM(PCA) mode: PTM dose:25 mcg bolus Duration of bolus:1 minute Lockout interval:6 hours Maximum 24-hour doses:4  Programming:  Type: Simple continuous. See pump readout for details.   Changes:  Medication Change: None at this point Rate Change: Today we have reviewed the programming on his intrathecal pump and he has been using his PCA mode to the maximum meaning that he had a total of 500 mcg/day, and he is still complaining of insufficient analgesia.  For this reason, today we have increased his basal rate to 550 mcg/day  and we have kept the PCA programming the same.  With that combination, he should be able to get a maximum of 650 mcg/day of fentanyl and 6.5 mg/day of bupivacaine/day.  Reported side-effects or adverse reactions: None reported  Effectiveness: Ineffective Clinically meaningful improvement in function (CMIF): Medication does not meet basic CMIF  Plan: Analysis and programming  Pre-op H&P Assessment:  Mr. JBoekeis a 69y.o. (year old), male patient, seen today for interventional treatment. He  has a past surgical history that includes Cholecystectomy (1983); Esophagogastroduodenoscopy (egd) with propofol (N/A, 11/20/2016); Cataract extraction w/ intraocular lens implant (Bilateral); Lower Extremity Angiography (Right, 05/19/2017); Esophagogastroduodenoscopy (egd) with propofol (N/A, 10/27/2017); Colonoscopy with propofol (N/A, 10/27/2017); Lower Extremity Angiography (Left, 08/10/2018); and Intrathecal pump implant (Left, 02/07/2020). Mr. JCimohas a current medication list which includes the following prescription(s): atorvastatin, clopidogrel, ensure, esomeprazole, folic acid, metformin, methocarbamol, naloxegol oxalate, naloxone, [START ON 03/23/2020] oxycodone hcl, [START ON 05/18/2020] PAIN MANAGEMENT IT PUMP REFILL, pantoprazole, and sucralfate. His primarily concern today is the Abdominal Pain  Initial Vital Signs:  Pulse/HCG Rate: 75  Temp: 97.7 F (36.5 C) Resp:   BP: 132/69  SpO2: 100 %  BMI: Estimated body mass index is 22.05 kg/m as calculated from the following:   Height as of this encounter: 5' 8"  (1.727 m).   Weight as of this encounter: 145 lb (65.8 kg).  Risk Assessment: Allergies: Reviewed. He has No Known Allergies.  Allergy Precautions: None required Coagulopathies: Reviewed. None identified.  Blood-thinner therapy: None at this time Active Infection(s): Reviewed. None identified. Mr. Venus is afebrile  Site Confirmation: Mr. Platten was asked to confirm the procedure and  laterality before marking the site Procedure checklist: Completed Consent: Before the procedure and under the influence of no sedative(s), amnesic(s), or anxiolytics, the patient was informed of the treatment options, risks and possible complications. To fulfill our ethical and legal obligations, as recommended by the American Medical Association's Code of Ethics, I have informed the patient of my clinical impression; the nature and purpose of the treatment or procedure; the risks, benefits, and possible complications of the intervention; the alternatives, including doing nothing; the risk(s) and benefit(s) of the alternative treatment(s) or procedure(s); and the risk(s) and benefit(s) of doing nothing.  Mr. Leverich was provided with information about the general risks and possible complications associated with most interventional procedures. These include, but are not limited to: failure to achieve desired goals, infection, bleeding, organ or nerve damage, allergic reactions, paralysis, and/or death.  In addition, he was informed of those risks and possible complications associated to this particular procedure, which include, but are not limited to: damage to the implant; failure to decrease pain; local, systemic, or serious CNS infections, intraspinal abscess with possible cord compression and paralysis, or life-threatening such as meningitis; bleeding; organ damage; nerve injury or damage with subsequent sensory, motor, and/or autonomic system dysfunction, resulting in transient or permanent pain, numbness, and/or weakness of one or several areas of the body; allergic reactions, either minor or major life-threatening, such as anaphylactic or anaphylactoid reactions.  Furthermore, Mr. Buch was informed of those risks and complications associated with the medications. These include, but are not limited to: allergic reactions (i.e.: anaphylactic or anaphylactoid reactions); endorphine suppression; bradycardia  and/or hypotension; water retention and/or peripheral vascular relaxation leading to lower extremity edema and possible stasis ulcers; respiratory depression and/or shortness of breath; decreased metabolic rate leading to weight gain; swelling or edema; medication-induced neural toxicity; particulate matter embolism and blood vessel occlusion with resultant organ, and/or nervous system infarction; and/or intrathecal granuloma formation with possible spinal cord compression and permanent paralysis.  Before refilling the pump Mr. Bossier was informed that some of the medications used in the devise may not be FDA approved for such use and therefore it constitutes an off-label use of the medications.  Finally, he was informed that Medicine is not an exact science; therefore, there is also the possibility of unforeseen or unpredictable risks and/or possible complications that may result in a catastrophic outcome. The patient indicated having understood very clearly. We have given the patient no guarantees and we have made no promises. Enough time was given to the patient to ask questions, all of which were answered to the patient's satisfaction. Mr. Kavanagh has indicated that he wanted to continue with the procedure. Attestation: I, the ordering provider, attest that I have discussed with the patient the benefits, risks, side-effects, alternatives, likelihood of achieving goals, and potential problems during recovery for the procedure that I have provided informed consent. Date  Time: 03/21/2020 10:05 AM  Pre-Procedure Preparation:  Monitoring: As per clinic protocol. Respiration, ETCO2, SpO2, BP, heart rate and rhythm  monitor placed and checked for adequate function Safety Precautions: Patient was assessed for positional comfort and pressure points before starting the procedure. Time-out: I initiated and conducted the "Time-out" before starting the procedure, as per protocol. The patient was asked to participate  by confirming the accuracy of the "Time Out" information. Verification of the correct person, site, and procedure were performed and confirmed by me, the nursing staff, and the patient. "Time-out" conducted as per Joint Commission's Universal Protocol (UP.01.01.01). Time:    Description of Procedure:          Position: Supine Target Area: Central-port of intrathecal pump. Approach: Anterior, 90 degree angle approach. Area Prepped: Entire Area around the pump implant. DuraPrep (Iodine Povacrylex [0.7% available iodine] and Isopropyl Alcohol, 74% w/w) Safety Precautions: Aspiration looking for blood return was conducted prior to all injections. At no point did we inject any substances, as a needle was being advanced. No attempts were made at seeking any paresthesias. Safe injection practices and needle disposal techniques used. Medications properly checked for expiration dates. SDV (single dose vial) medications used. Description of the Procedure: Protocol guidelines were followed. Two nurses trained to do implant refills were present during the entire procedure. The refill medication was checked by both healthcare providers as well as the patient. The patient was included in the "Time-out" to verify the medication. The patient was placed in position. The pump was identified. The area was prepped in the usual manner. The sterile template was positioned over the pump, making sure the side-port location matched that of the pump. Both, the pump and the template were held for stability. The needle provided in the Medtronic Kit was then introduced thru the center of the template and into the central port. The pump content was aspirated and discarded volume documented. The new medication was slowly infused into the pump, thru the filter, making sure to avoid overpressure of the device. The needle was then removed and the area cleansed, making sure to leave some of the prepping solution back to take advantage of  its long term bactericidal properties. The pump was interrogated and programmed to reflect the correct medication, volume, and dosage. The program was printed and taken to the physician for approval. Once checked and signed by the physician, a copy was provided to the patient and another scanned into the EMR. Vitals:   03/21/20 1002  BP: 132/69  Pulse: 75  Temp: 97.7 F (36.5 C)  SpO2: 100%  Weight: 145 lb (65.8 kg)  Height: 5' 8"  (1.727 m)    Start Time:   hrs. End Time:   hrs. Materials & Medications: Medtronic Refill Kit Medication(s): Please see chart orders for details.  Imaging Guidance:          Type of Imaging Technique: None used Indication(s): N/A Exposure Time: No patient exposure Contrast: None used. Fluoroscopic Guidance: N/A Ultrasound Guidance: N/A Interpretation: N/A  Antibiotic Prophylaxis:   Anti-infectives (From admission, onward)   None     Indication(s): None identified  Post-operative Assessment:  Post-procedure Vital Signs:  Pulse/HCG Rate: 75  Temp: 97.7 F (36.5 C) Resp:   BP: 132/69 SpO2: 100 %  EBL: None  Complications: No immediate post-treatment complications observed by team, or reported by patient.  Note: The patient tolerated the entire procedure well. A repeat set of vitals were taken after the procedure and the patient was kept under observation following institutional policy, for this type of procedure. Post-procedural neurological assessment was performed, showing return to baseline, prior to  discharge. The patient was provided with post-procedure discharge instructions, including a section on how to identify potential problems. Should any problems arise concerning this procedure, the patient was given instructions to immediately contact us, at any time, without hesitation. In any case, we plan to contact the patient by telephone for a follow-up status report regarding this interventional procedure.  Comments:  No additional relevant  information.  Plan of Care  Orders:  Orders Placed This Encounter  Procedures  . PUMP REPROGRAM    Follow programming protocol by having two(2) healthcare providers present during programming.    Scheduling Instructions:     Please perform the following adjustment: Increase rate by 38%.    Order Specific Question:   Where will this procedure be performed?    Answer:   [facility]  . MR THORACIC SPINE WO CONTRAST    Patient presents with axial pain with possible radicular component.  In addition to any acute findings, please report on:  1. Facet (Zygapophyseal) joint DJD (Hypertrophy, space narrowing, subchondral sclerosis, and/or osteophyte formation) 2. DDD and/or IVDD (Loss of disc height, desiccation or "Black disc disease") 3. Pars defects 4. Spondylolisthesis, spondylosis, and/or spondyloarthropathies (include Degree/Grade of displacement in mm) 5. Vertebral body Fractures, including age (old, new/acute) 12. Modic Type Changes 7. Demineralization 8. Bone pathology 9. Central, Lateral Recess, and/or Foraminal Stenosis (include AP diameter of stenosis in mm) 10. Surgical changes (hardware type, status, and presence of fibrosis) NOTE: Please specify level(s) and laterality.    Standing Status:   Future    Standing Expiration Date:   04/19/2020    Scheduling Instructions:     Imaging must be done as soon as possible. Inform patient that order will expire within 30 days and I will not renew it.    Order Specific Question:   What is the patient's sedation requirement?    Answer:   No Sedation    Order Specific Question:   Does the patient have a pacemaker or implanted devices?    Answer:   No    Order Specific Question:   Preferred imaging location?    Answer:   [facility] (table limit-350lbs)    Order Specific Question:   Call Results- Best Contact Number?    Answer:   [phone] ([facility])    Order Specific Question:   Radiology Contrast Protocol  - do NOT remove file path    Answer:   \\[email or web address]\[email or web address]  . DG Thoracic Spine 2 View    Patient presents with axial pain with possible radicular component.  In addition to any acute findings, please report on:  1. Facet (Zygapophyseal) joint DJD (Hypertrophy, space narrowing, subchondral sclerosis, and/or osteophyte formation) 2. DDD and/or IVDD (Loss of disc height, desiccation or "Black disc disease") 3. Pars defects 4. Spondylolisthesis, spondylosis, and/or spondyloarthropathies (include Degree/Grade of displacement in mm) 5. Vertebral body Fractures, including age (old, new/acute) 61. Modic Type Changes 7. Demineralization 8. Bone pathology 9. Central, Lateral Recess, and/or Foraminal Stenosis (include AP diameter of stenosis in mm) 10. Surgical changes (hardware type, status, and presence of fibrosis) NOTE: Please specify level(s) and laterality.    Standing Status:   Future    Standing Expiration Date:   04/19/2020    Scheduling Instructions:     Imaging must be done as soon as possible. Inform patient that order will expire within 30 days and I will not renew it.    Order Specific Question:   Reason for Exam (SYMPTOM  OR  DIAGNOSIS REQUIRED)    Answer:   Upper back pain and/or thoracic spine pain.    Order Specific Question:   Preferred imaging location?    Answer:   [facility]    Order Specific Question:   Call Results- Best Contact Number?    Answer:   [phone] [facility])  . DG Lumbar Spine Complete W/Bend    Patient presents with axial pain with possible radicular component.  In addition to any acute findings, please report on:  1. Facet (Zygapophyseal) joint DJD (Hypertrophy, space narrowing, subchondral sclerosis, and/or osteophyte formation) 2. DDD and/or IVDD (Loss of disc height, desiccation or "Black disc disease") 3. Pars defects 4. Spondylolisthesis, spondylosis, and/or spondyloarthropathies (include Degree/Grade of  displacement in mm) 5. Vertebral body Fractures, including age (old, new/acute) 69. Modic Type Changes 7. Demineralization 8. Bone pathology 9. Central, Lateral Recess, and/or Foraminal Stenosis (include AP diameter of stenosis in mm) 10. Surgical changes (hardware type, status, and presence of fibrosis)  NOTE: Please specify level(s) and laterality. If applicable: Please indicate ROM and/or evidence of instability (>25mm displacement between flexion and extension views)    Standing Status:   Future    Standing Expiration Date:   04/19/2020    Scheduling Instructions:     Imaging must be done as soon as possible. Inform patient that order will expire within 30 days and I will not renew it.    Order Specific Question:   Reason for Exam (SYMPTOM  OR DIAGNOSIS REQUIRED)    Answer:   Low back pain    Order Specific Question:   Preferred imaging location?    Answer:   [facility]    Order Specific Question:   Call Results- Best Contact Number?    Answer:   [phone] ([facility])    Order Specific Question:   Radiology Contrast Protocol - do NOT remove file path    Answer:   \\[email or web address]    Order Specific Question:   Release to patient    Answer:   Immediate   Chronic Opioid Analgesic:  Oxycodone IR $RemoveBefo'10mg'xTudgCttbwQ$ , 1 tab PO q 4 hrs (60 mg/day of oxycodone IR) (90 MME) + morphine ER 30 mg twice daily (60 mg/day of morphine ER) (60 MME) (150 MME/day). Highest recorded MME/day: 150 mg/day 90 day average MME/day: 77.89 mg/day   Medications ordered for procedure: Meds ordered this encounter  Medications  . Oxycodone HCl 10 MG TABS    Sig: Take 1 tablet (10 mg total) by mouth every 4 (four) hours as needed for up to 10 days. Maximum of 6/day.    Dispense:  60 tablet    Refill:  0   Medications administered: Tevis F. Duskin had no medications administered during this visit.  See the medical record for exact dosing, route, and time of  administration.  Follow-up plan:   Return today (on 03/21/2020) for (F2F) Pump adjustment & X-Ray F/U eval.       Interventional treatment options: Planned, scheduled, and/or pending:    NOTE: PLAVIX ANTICOAGULATION  (Stop: 7 days  Restart: 2 hrs)    Under consideration:   Diagnostic intrathecal injection of opioid analgesic (intrathecal pump trial).   Therapeutic/palliative (PRN):   Diagnostic bilateral celiac plexus block #3  (patient indicated no relief)     Recent Visits Date Type Provider Dept  03/13/20 Procedure visit Milinda Pointer, MD [facility]  03/09/20 Procedure visit Milinda Pointer, MD [facility]  03/06/20 Procedure visit Milinda Pointer, MD [facility]  03/02/20 Procedure visit Milinda Pointer, MD [facility]  Showing recent visits within past 90 days and meeting all other requirements Today's Visits Date Type Provider Dept  03/21/20 Procedure visit Milinda Pointer, MD [facility]  Showing today's visits and meeting all other requirements Future Appointments Date Type Provider Dept  03/27/20 Appointment Milinda Pointer, [facility]  06/01/20 Appointment Milinda Pointer, MD [facility]  Showing future appointments within next 90 days and meeting all other requirements  Disposition: Discharge home  Discharge (Date  Time): 03/21/2020; 1043 hrs.   Primary Care Physician: [name], MD Location: [facility] Note by: Gaspar Cola, MD Date: 03/21/2020; Time: 11:29 AM  Disclaimer:  Medicine is not an Chief Strategy Officer. The only guarantee in medicine is that nothing is guaranteed. It is important to note that the decision to proceed with this intervention was based on the information collected from the patient. The Data and conclusions were drawn from the patient's questionnaire, the interview, and the physical examination. Because  the information was provided in large part by the patient, it cannot be guaranteed that it has not been purposely or unconsciously manipulated. Every effort has been made to obtain as much relevant data as possible for this evaluation. It is important to note that the conclusions that lead to this procedure are derived in large part from the available data. Always take into account that the treatment will also be dependent on availability of resources and existing treatment guidelines, considered by other Pain Management Practitioners as being common knowledge and practice, at the time of the intervention. For Medico-Legal purposes, it is also important to point out that variation in procedural techniques and pharmacological choices are the acceptable norm. The indications, contraindications, technique, and results of the above procedure should only be interpreted and judged by a Board-Certified Interventional Pain Specialist with extensive familiarity and expertise in the same exact procedure and technique.

## 2020-03-21 ENCOUNTER — Encounter: Payer: Self-pay | Admitting: Pain Medicine

## 2020-03-21 ENCOUNTER — Ambulatory Visit: Payer: Medicare Other | Attending: Pain Medicine | Admitting: Pain Medicine

## 2020-03-21 ENCOUNTER — Other Ambulatory Visit: Payer: Self-pay

## 2020-03-21 VITALS — BP 132/69 | HR 75 | Temp 97.7°F | Ht 68.0 in | Wt 145.0 lb

## 2020-03-21 DIAGNOSIS — C8590 Non-Hodgkin lymphoma, unspecified, unspecified site: Secondary | ICD-10-CM | POA: Diagnosis not present

## 2020-03-21 DIAGNOSIS — M5134 Other intervertebral disc degeneration, thoracic region: Secondary | ICD-10-CM | POA: Diagnosis not present

## 2020-03-21 DIAGNOSIS — Z978 Presence of other specified devices: Secondary | ICD-10-CM

## 2020-03-21 DIAGNOSIS — Z95828 Presence of other vascular implants and grafts: Secondary | ICD-10-CM

## 2020-03-21 DIAGNOSIS — Z451 Encounter for adjustment and management of infusion pump: Secondary | ICD-10-CM | POA: Diagnosis not present

## 2020-03-21 DIAGNOSIS — C8300 Small cell B-cell lymphoma, unspecified site: Secondary | ICD-10-CM

## 2020-03-21 DIAGNOSIS — C9110 Chronic lymphocytic leukemia of B-cell type not having achieved remission: Secondary | ICD-10-CM

## 2020-03-21 DIAGNOSIS — R1013 Epigastric pain: Secondary | ICD-10-CM | POA: Diagnosis not present

## 2020-03-21 DIAGNOSIS — G894 Chronic pain syndrome: Secondary | ICD-10-CM | POA: Diagnosis present

## 2020-03-21 DIAGNOSIS — G893 Neoplasm related pain (acute) (chronic): Secondary | ICD-10-CM

## 2020-03-21 DIAGNOSIS — F1190 Opioid use, unspecified, uncomplicated: Secondary | ICD-10-CM

## 2020-03-21 DIAGNOSIS — G8929 Other chronic pain: Secondary | ICD-10-CM

## 2020-03-21 DIAGNOSIS — Z79899 Other long term (current) drug therapy: Secondary | ICD-10-CM

## 2020-03-21 DIAGNOSIS — R1084 Generalized abdominal pain: Secondary | ICD-10-CM

## 2020-03-21 MED ORDER — OXYCODONE HCL 10 MG PO TABS: 10.0000 mg | ORAL_TABLET | ORAL | 0 refills | Status: DC | PRN

## 2020-03-21 NOTE — Patient Instructions (Signed)
Opioid Overdose Opioids are drugs that are often used to treat pain. Opioids include illegal drugs, such as heroin, as well as prescription pain medicines, such as codeine, morphine, hydrocodone, oxycodone, and fentanyl. An opioid overdose happens when you take too much of an opioid. An overdose may be intentional or accidental and can happen with any type of opioid. The effects of an overdose can be mild, dangerous, or even deadly. Opioid overdose is a medical emergency. What are the causes? This condition may be caused by:  Taking too much of an opioid on purpose.  Taking too much of an opioid by accident.  Using two or more substances that contain opioids at the same time.  Taking an opioid with a substance that affects your heart, breathing, or blood pressure. These include alcohol, tranquilizers, sleeping pills, illegal drugs, and some over-the-counter medicines. This condition may also happen due to an error made by:  A health care provider who prescribes a medicine.  The pharmacist who fills the prescription order. What increases the risk? This condition is more likely in:  Children. They may be attracted to colorful pills. Because of a child's small size, even a small amount of a drug can be dangerous.  Older people. They may be taking many different drugs. Older people may have difficulty reading labels or remembering when they last took their medicine. They may also be more sensitive to the effects of opioids.  People with chronic medical conditions, especially heart, liver, kidney, or neurological diseases.  People who take an opioid for a long period of time.  People who use: ? Illegal drugs. IV heroin is especially dangerous. ? Other substances, including alcohol, while using an opioid.  People who have: ? A history of drug or alcohol abuse. ? Certain mental health conditions. ? A history of previous drug overdoses.  People who take opioids that are not prescribed  for them. What are the signs or symptoms? Symptoms of this condition depend on the type of opioid and the amount that was taken. Common symptoms include:  Sleepiness or difficulty waking from sleep.  Decrease in attention.  Confusion.  Slurred speech.  Slowed breathing and a slow pulse (bradycardia).  Nausea and vomiting.  Abnormally small pupils. Signs and symptoms that require emergency treatment include:  Cold, clammy, and pale skin.  Blue lips and fingernails.  Vomiting.  Gurgling sounds in the throat.  A pulse that is very slow or difficult to detect.  Breathing that is very irregular, slow, noisy, or difficult to detect.  Limp body.  Inability to respond to speech or be awakened from sleep (stupor).  Seizures. How is this diagnosed? This condition is diagnosed based on your symptoms and medical history. It is important to tell your health care provider:  About all of the opioids that you took.  When you took the opioids.  Whether you were drinking alcohol or using marijuana, cocaine, or other drugs. Your health care provider will do a physical exam. This exam may include:  Checking and monitoring your heart rate and rhythm, breathing rate, temperature, and blood pressure (vital signs).  Measuring oxygen levels in your blood.  Checking for abnormally small pupils. You may also have blood tests or urine tests. You may have X-rays if you are having severe breathing problems. How is this treated? This condition requires immediate medical treatment and hospitalization. Treatment is given in the hospital intensive care (ICU) setting. Supporting your vital signs and your breathing is the first step in  treating an opioid overdose. Treatment may also include:  Giving salts and minerals (electrolytes) along with fluids through an IV.  Inserting a breathing tube (endotracheal tube) in your airway to help you breathe if you cannot breathe on your own or you are in  danger of not being able to breathe on your own.  Giving oxygen through a small tube under your nose.  Passing a tube through your nose and into your stomach (nasogastric tube, or NG tube) to empty your stomach.  Giving medicines that: ? Increase your blood pressure. ? Relieve nausea and vomiting. ? Relieve abdominal pain and cramping. ? Reverse the effects of the opioid (naloxone).  Monitoring your heart and oxygen levels.  Ongoing counseling and mental health support if you intentionally overdosed or used an illegal drug. Follow these instructions at home:  Medicines  Take over-the-counter and prescription medicines only as told by your health care provider.  Always ask your health care provider about possible side effects and interactions of any new medicine that you start taking.  Keep a list of all the medicines that you take, including over-the-counter medicines. Bring this list with you to all your medical visits. General instructions  Drink enough fluid to keep your urine pale yellow.  Keep all follow-up visits as told by your health care provider. This is important. How is this prevented?  Read the drug inserts that come with your opioid pain medicines.  Take medicines only as told by your health care provider. Do not take more medicine than you are told. Do not take medicines more frequently than you are told.  Do not drink alcohol or take sedatives when taking opioids.  Do not use illegal or recreational drugs, including cocaine, ecstasy, and marijuana.  Do not take opioid medicines that are not prescribed for you.  Store all medicines in safety containers that are out of the reach of children.  Get help if you are struggling with: ? Alcohol or drug use. ? Depression or another mental health problem. ? Thoughts of hurting yourself or another person.  Keep the phone number of your local poison control center near your phone or in your mobile phone. In the  [place] the hotline of the National Poison Control Center is (800) 222-1222.  If you were prescribed naloxone, make sure you understand how to take it. Contact a health care provider if you:  Need help understanding how to take your pain medicines.  Feel your medicines are too strong.  Are concerned that your pain medicines are not working well for your pain.  Develop new symptoms or side effects when you are taking medicines. Get help right away if:  You or someone else is having symptoms of an opioid overdose. Get help even if you are not sure.  You have serious thoughts about hurting yourself or others.  You have: ? Chest pain. ? Difficulty breathing. ? A loss of consciousness. These symptoms may represent a serious problem that is an emergency. Do not wait to see if the symptoms will go away. Get medical help right away. Call your local emergency services (911 in the [place] Do not drive yourself to the hospital. If you ever feel like you may hurt yourself or others, or have thoughts about taking your own life, get help right away. You can go to your nearest emergency department or call:  Your local emergency services (911 in the [place]  A suicide crisis helpline, such as the National Suicide Prevention Lifeline at   1-800-273-8255. This is open 24 hours a day. Summary  Opioids are drugs that are often used to treat pain. Opioids include illegal drugs, such as heroin, as well as prescription pain medicines.  An opioid overdose happens when you take too much of an opioid.  Overdoses can be intentional or accidental.  Opioid overdose is very dangerous. It is a life-threatening emergency.  If you or someone you know is experiencing an opioid overdose, get help right away. This information is not intended to replace advice given to you by your health care provider. Make sure you discuss any questions you have with your health care provider. Document Revised: 04/02/2018 Document  Reviewed: 04/02/2018 [employer] Patient Education  2020 [employer]  

## 2020-03-21 NOTE — Progress Notes (Signed)
Nursing Pain Medication Assessment:  Safety precautions to be maintained throughout the outpatient stay will include: orient to surroundings, keep bed in low position, maintain call bell within reach at all times, provide assistance with transfer out of bed and ambulation.  Medication Inspection Compliance: Pill count conducted under aseptic conditions, in front of the patient. Neither the pills nor the bottle was removed from the patient's sight at any time. Once count was completed pills were immediately returned to the patient in their original bottle.  Medication: Oxycodone IR Pill/Patch Count: 10 of 60 pills remain Pill/Patch Appearance: Markings consistent with prescribed medication Bottle Appearance: Standard pharmacy container. Clearly labeled. Filled Date: 46 / 15 / 21 Last Medication intake:  TodaySafety precautions to be maintained throughout the outpatient stay will include: orient to surroundings, keep bed in low position, maintain call bell within reach at all times, provide assistance with transfer out of bed and ambulation.

## 2020-03-22 ENCOUNTER — Telehealth: Payer: Self-pay

## 2020-03-22 NOTE — Telephone Encounter (Signed)
Post pump adjustment phone call.  Patient states he is doing ok but he felt like batteries were dying on his PTM remote.  Called [employer] and was instructed to change the batteries and gave the patient the number to patient services in case he needed help while the office was closed.

## 2020-03-26 NOTE — Progress Notes (Signed)
PROVIDER NOTE: Information contained herein reflects review and annotations entered in association with encounter. Interpretation of such information and data should be left to medically-trained personnel. Information provided to patient can be located elsewhere in the medical record under "Patient Instructions". Document created using STT-dictation technology, any transcriptional errors that may result from process are unintentional.    Patient: James [name]  Service Category: Procedure  Provider: Gaspar Cola, MD  DOB: 14-Mar-1951  DOS: 03/27/2020  Location: [facility]  MRN: [id number]  Setting: Ambulatory - outpatient  Referring Provider: [name], MD  Type: Established Patient  Specialty: Interventional Pain Management  PCP: [name], MD   Primary Reason for Visit: Interventional Pain Management Treatment. CC: Abdominal Pain  Procedure:          Intrathecal Drug Delivery System (IDDS):  Type: Analysis & Programming [phone]) Rate increase. Region: Abdominal Laterality: Left  Type of Pump: Medtronic Synchromed II Delivery Route: Intrathecal Type of Pain Treated: Neuropathic/Nociceptive Primary Medication Class: Opioid/opiate  Medication, Concentration, Infusion Program, & Delivery Rate: Please see scanned programming printout.   Indications: 1. Chronic pain syndrome   2. Cancer-related pain   3. Chronic abdominal pain   4. Abdominal wall pain in epigastric region   5. CLL (chronic lymphocytic leukemia) ([place])   6. Generalized abdominal pain   7. Malignant lymphoma, small lymphocytic ([place])   8. Physical tolerance to opiate drug   9. Presence of implanted infusion pump ([employer])   10. Presence of intrathecal pump ([employer])   11. Encounter for adjustment and management of infusion pump    Pain Assessment: Self-Reported Pain Score: 6 /10             Reported level is compatible with observation.        Pharmacotherapy Assessment   Analgesic: Oxycodone IR 35m, 1 tab PO q 4 hrs (60 mg/day of oxycodone IR) (90 MME) + morphine ER 30 mg twice daily (60 mg/day of morphine ER) (60 MME) (150 MME/day). Highest recorded MME/day: 150 mg/day 90 day average MME/day: 77.89 mg/day   Monitoring: [place] PMP: PDMP reviewed during this encounter.       Pharmacotherapy: No side-effects or adverse reactions reported. Compliance: No problems identified. Effectiveness: Clinically acceptable. Plan: Refer to "POC".  UDS: No results found for: SUMMARY  Intrathecal Pump Therapy Assessment  Manufacturer: Medtronic Synchromed Type: Programmable Volume: 40 mL reservoir MRI compatibility: Yes   Drug content:  Primary Medication Class:Opioid Primary Medication:PF-Fentanyl(2000 mcg/mL) Secondary Medication:PF-Bupivacaine(20 mg/mL) Other Medication:None  PTM(PCA) mode: PTM dose:25 mcg bolus Duration of bolus:1 minute Lockout interval:6 hours Maximum 24-hour doses:4   Programming:  Type: Simple continuous. See pump readout for details.   Changes:  Medication Change: None at this point Rate Change: Today we have reviewed the programming on his intrathecal pump and he has been using his PCA mode to the maximum meaning that he had a total of 649.1 mcg/day, and he is still complaining of insufficient analgesia.  For this reason, today we have increased his basal rate to 700.4 mcg/day.  In addition, since he has been using the on-demand PCA mode to the maximum, I will decrease the interval from 6 hours to 4 hours.  This way we will go from a maximum allowed activations of 4/day to 6/day (1 every 4 hours). With that combination, he should be able to get a maximum of 847.4 mcg/day of fentanyl and 8.474 mg/day of bupivacaine.  The amount of fentanyl and bupivacaine that he can get per  dose will remain the same as before.  Reported side-effects or adverse reactions: None reported  Effectiveness: Described as relatively effective,  allowing for increase in activities of daily living (ADL) Clinically meaningful improvement in function (CMIF): Sustained CMIF goals met  Plan: Analysis and programming.  Today I have increased the amount of on demand PCA daily dose by 21.0%.  (This will only be given to the patient if he activates the PCA mode).  In terms of his basal rate, this will be increased today by 27.2% to simply match what he has already been using.  I will continue to increase the pump as tolerated until he shows any type of side effects or we significantly decrease the pain.  If by the time that we get to bupivacaine 12 mg/day he has not had any significant relief, we may consider doing a celiac plexus block and possible neurolysis.  However, the patient is on Plavix and that may need to be taken into consideration.  Pre-op H&P Assessment:  James [name] is a 69 y.o. (year old), male patient, seen today for interventional treatment. He  has a past surgical history that includes Cholecystectomy (1983); Esophagogastroduodenoscopy (egd) with propofol (N/A, 11/20/2016); Cataract extraction w/ intraocular lens implant (Bilateral); Lower Extremity Angiography (Right, 05/19/2017); Esophagogastroduodenoscopy (egd) with propofol (N/A, 10/27/2017); Colonoscopy with propofol (N/A, 10/27/2017); Lower Extremity Angiography (Left, 08/10/2018); and Intrathecal pump implant (Left, 02/07/2020). Mr. Marsico has a current medication list which includes the following prescription(s): atorvastatin, clopidogrel, ensure, esomeprazole, folic acid, metformin, methocarbamol, naloxegol oxalate, naloxone, [START ON 04/02/2020] oxycodone hcl, [START ON 05/18/2020] PAIN MANAGEMENT IT PUMP REFILL, pantoprazole, and sucralfate. His primarily concern today is the Abdominal Pain  Initial Vital Signs:  Pulse/HCG Rate: 98  Temp: 99 F (37.2 C) Resp: 18 BP: 103/72 SpO2: 100 %  BMI: Estimated body mass index is 22.05 kg/m as calculated from the following:   Height as of  this encounter: _0  (1.727 m).   Weight as of this encounter: 145 lb (65.8 kg).  Risk Assessment: Allergies: Reviewed. He has No Known Allergies.  Allergy Precautions: None required Coagulopathies: Reviewed. None identified.  Blood-thinner therapy: None at this time Active Infection(s): Reviewed. None identified. Mr. Grasmick is afebrile  Site Confirmation: Mr. Hickling was asked to confirm the procedure and laterality before marking the site Procedure checklist: Completed Consent: Before the procedure and under the influence of no sedative(s), amnesic(s), or anxiolytics, the patient was informed of the treatment options, risks and possible complications. To fulfill our ethical and legal obligations, as recommended by the American Medical Association's Code of Ethics, I have informed the patient of my clinical impression; the nature and purpose of the treatment or procedure; the risks, benefits, and possible complications of the intervention; the alternatives, including doing nothing; the risk(s) and benefit(s) of the alternative treatment(s) or procedure(s); and the risk(s) and benefit(s) of doing nothing.  Mr. Helbert was provided with information about the general risks and possible complications associated with most interventional procedures. These include, but are not limited to: failure to achieve desired goals, infection, bleeding, organ or nerve damage, allergic reactions, paralysis, and/or death.  In addition, he was informed of those risks and possible complications associated to this particular procedure, which include, but are not limited to: damage to the implant; failure to decrease pain; local, systemic, or serious CNS infections, intraspinal abscess with possible cord compression and paralysis, or life-threatening such as meningitis; bleeding; organ damage; nerve injury or damage with subsequent sensory, motor, and/or autonomic system  dysfunction, resulting in transient or permanent pain,  numbness, and/or weakness of one or several areas of the body; allergic reactions, either minor or major life-threatening, such as anaphylactic or anaphylactoid reactions.  Furthermore, Mr. Bruun was informed of those risks and complications associated with the medications. These include, but are not limited to: allergic reactions (i.e.: anaphylactic or anaphylactoid reactions); endorphine suppression; bradycardia and/or hypotension; water retention and/or peripheral vascular relaxation leading to lower extremity edema and possible stasis ulcers; respiratory depression and/or shortness of breath; decreased metabolic rate leading to weight gain; swelling or edema; medication-induced neural toxicity; particulate matter embolism and blood vessel occlusion with resultant organ, and/or nervous system infarction; and/or intrathecal granuloma formation with possible spinal cord compression and permanent paralysis.  Before refilling the pump Mr. Bolger was informed that some of the medications used in the devise may not be FDA approved for such use and therefore it constitutes an off-label use of the medications.  Finally, he was informed that Medicine is not an exact science; therefore, there is also the possibility of unforeseen or unpredictable risks and/or possible complications that may result in a catastrophic outcome. The patient indicated having understood very clearly. We have given the patient no guarantees and we have made no promises. Enough time was given to the patient to ask questions, all of which were answered to the patient's satisfaction. Mr. Duffner has indicated that he wanted to continue with the procedure. Attestation: I, the ordering provider, attest that I have discussed with the patient the benefits, risks, side-effects, alternatives, likelihood of achieving goals, and potential problems during recovery for the procedure that I have provided informed consent. Date  Time: 03/27/2020 12:52  PM  Pre-Procedure Preparation:  Monitoring: As per clinic protocol. Respiration, ETCO2, SpO2, BP, heart rate and rhythm monitor placed and checked for adequate function Safety Precautions: Patient was assessed for positional comfort and pressure points before starting the procedure. Time-out: I initiated and conducted the "Time-out" before starting the procedure, as per protocol. The patient was asked to participate by confirming the accuracy of the "Time Out" information. Verification of the correct person, site, and procedure were performed and confirmed by me, the nursing staff, and the patient. "Time-out" conducted as per Joint Commission's Universal Protocol (UP.01.01.01). Time:    Description of Procedure:          Position: Supine Target Area: Central-port of intrathecal pump. Approach: Anterior, 90 degree angle approach. Area Prepped: Entire Area around the pump implant. DuraPrep (Iodine Povacrylex [0.7% available iodine] and Isopropyl Alcohol, 74% w/w) Safety Precautions: Aspiration looking for blood return was conducted prior to all injections. At no point did we inject any substances, as a needle was being advanced. No attempts were made at seeking any paresthesias. Safe injection practices and needle disposal techniques used. Medications properly checked for expiration dates. SDV (single dose vial) medications used. Description of the Procedure: Protocol guidelines were followed. Two nurses trained to do implant refills were present during the entire procedure. The refill medication was checked by both healthcare providers as well as the patient. The patient was included in the "Time-out" to verify the medication. The patient was placed in position. The pump was identified. The area was prepped in the usual manner. The sterile template was positioned over the pump, making sure the side-port location matched that of the pump. Both, the pump and the template were held for stability. The  needle provided in the [employer] Kit was then introduced thru the center of the template and  into the central port. The pump content was aspirated and discarded volume documented. The new medication was slowly infused into the pump, thru the filter, making sure to avoid overpressure of the device. The needle was then removed and the area cleansed, making sure to leave some of the prepping solution back to take advantage of its long term bactericidal properties. The pump was interrogated and programmed to reflect the correct medication, volume, and dosage. The program was printed and taken to the physician for approval. Once checked and signed by the physician, a copy was provided to the patient and another scanned into the EMR. Vitals:   03/27/20 1251  BP: 103/72  Pulse: 98  Resp: 18  Temp: 99 F (37.2 C)  TempSrc: Temporal  SpO2: 100%  Weight: 145 lb (65.8 kg)  Height: _0  (1.727 m)    Start Time:   hrs. End Time:   hrs. Materials & Medications: Medtronic Refill Kit Medication(s): Please see chart orders for details.  Imaging Guidance:          Type of Imaging Technique: None used Indication(s): N/A Exposure Time: No patient exposure Contrast: None used. Fluoroscopic Guidance: N/A Ultrasound Guidance: N/A Interpretation: N/A  Antibiotic Prophylaxis:   Anti-infectives (From admission, onward)   None     Indication(s): None identified  Post-operative Assessment:  Post-procedure Vital Signs:  Pulse/HCG Rate: 98  Temp: 99 F (37.2 C) Resp: 18 BP: 103/72 SpO2: 100 %  EBL: None  Complications: No immediate post-treatment complications observed by team, or reported by patient.  Note: The patient tolerated the entire procedure well. A repeat set of vitals were taken after the procedure and the patient was kept under observation following institutional policy, for this type of procedure. Post-procedural neurological assessment was performed, showing return to baseline, prior  to discharge. The patient was provided with post-procedure discharge instructions, including a section on how to identify potential problems. Should any problems arise concerning this procedure, the patient was given instructions to immediately contact us, at any time, without hesitation. In any case, we plan to contact the patient by telephone for a follow-up status report regarding this interventional procedure.  Comments:  No additional relevant information.  Plan of Care  Orders:  Orders Placed This Encounter  Procedures  . PUMP REPROGRAM    Follow programming protocol by having two(2) healthcare providers present during programming.    Scheduling Instructions:     Please perform the following adjustment: Increase basal rate to 700 mcg of fentanyl per day    Order Specific Question:   Where will this procedure be performed?    Answer:   [facility]   Chronic Opioid Analgesic:  Oxycodone IR 70m, 1 tab PO q 4 hrs (60 mg/day of oxycodone IR) (90 MME) + morphine ER 30 mg twice daily (60 mg/day of morphine ER) (60 MME) (150 MME/day). Highest recorded MME/day: 150 mg/day 90 day average MME/day: 77.89 mg/day   Medications ordered for procedure: Meds ordered this encounter  Medications  . Oxycodone HCl 10 MG TABS    Sig: Take 1 tablet (10 mg total) by mouth every 4 (four) hours as needed for up to 10 days. Maximum of 6/day.    Dispense:  60 tablet    Refill:  0   Medications administered: Tacari F. Wolff had no medications administered during this visit.  See the medical record for exact dosing, route, and time of administration.  Follow-up plan:   Return in about 2 days (around  03/29/2020) for (F2F) pump analyses and programming.       Interventional treatment options: Planned, scheduled, and/or pending:    NOTE: PLAVIX ANTICOAGULATION  (Stop: 7 days  Restart: 2 hrs)    Under consideration:   Diagnostic intrathecal injection of opioid analgesic (intrathecal pump  trial).   Therapeutic/palliative (PRN):   Diagnostic bilateral celiac plexus block #3  (patient indicated no relief)      Recent Visits Date Type Provider Dept  03/21/20 Procedure visit Milinda Pointer, MD [facility]  03/13/20 Procedure visit Milinda Pointer, MD [facility]  03/09/20 Procedure visit Milinda Pointer, MD [facility]  03/06/20 Procedure visit Milinda Pointer, MD [facility]  03/02/20 Procedure visit Milinda Pointer, MD [facility]  Showing recent visits within past 90 days and meeting all other requirements Today's Visits Date Type Provider Dept  03/27/20 Procedure visit Milinda Pointer, MD [facility]  Showing today's visits and meeting all other requirements Future Appointments Date Type Provider Dept  03/29/20 Appointment Milinda Pointer, MD [facility]  06/01/20 Appointment Milinda Pointer, MD [facility]  Showing future appointments within next 90 days and meeting all other requirements  Disposition: Discharge home  Discharge (Date  Time): 03/27/2020;   hrs.   Primary Care Physician: [name], MD Location: [facility] Note by: Gaspar Cola, MD Date: 03/27/2020; Time: 3:16 PM  Disclaimer:  Medicine is not an Chief Strategy Officer. The only guarantee in medicine is that nothing is guaranteed. It is important to note that the decision to proceed with this intervention was based on the information collected from the patient. The Data and conclusions were drawn from the patient's questionnaire, the interview, and the physical examination. Because the information was provided in large part by the patient, it cannot be guaranteed that it has not been purposely or unconsciously manipulated. Every effort has been made to obtain as much relevant data as possible for this evaluation. It is important to note that the conclusions that  lead to this procedure are derived in large part from the available data. Always take into account that the treatment will also be dependent on availability of resources and existing treatment guidelines, considered by other Pain Management Practitioners as being common knowledge and practice, at the time of the intervention. For Medico-Legal purposes, it is also important to point out that variation in procedural techniques and pharmacological choices are the acceptable norm. The indications, contraindications, technique, and results of the above procedure should only be interpreted and judged by a Board-Certified Interventional Pain Specialist with extensive familiarity and expertise in the same exact procedure and technique.

## 2020-03-27 ENCOUNTER — Other Ambulatory Visit: Payer: Self-pay

## 2020-03-27 ENCOUNTER — Ambulatory Visit: Payer: Medicare Other | Attending: Pain Medicine | Admitting: Pain Medicine

## 2020-03-27 ENCOUNTER — Encounter: Payer: Self-pay | Admitting: Pain Medicine

## 2020-03-27 VITALS — BP 103/72 | HR 98 | Temp 99.0°F | Resp 18 | Ht 68.0 in | Wt 145.0 lb

## 2020-03-27 DIAGNOSIS — D631 Anemia in chronic kidney disease: Secondary | ICD-10-CM

## 2020-03-27 DIAGNOSIS — C8300 Small cell B-cell lymphoma, unspecified site: Secondary | ICD-10-CM

## 2020-03-27 DIAGNOSIS — F1190 Opioid use, unspecified, uncomplicated: Secondary | ICD-10-CM

## 2020-03-27 DIAGNOSIS — N183 Chronic kidney disease, stage 3 unspecified: Secondary | ICD-10-CM

## 2020-03-27 DIAGNOSIS — Z9049 Acquired absence of other specified parts of digestive tract: Secondary | ICD-10-CM | POA: Insufficient documentation

## 2020-03-27 DIAGNOSIS — Z7902 Long term (current) use of antithrombotics/antiplatelets: Secondary | ICD-10-CM | POA: Insufficient documentation

## 2020-03-27 DIAGNOSIS — G894 Chronic pain syndrome: Secondary | ICD-10-CM

## 2020-03-27 DIAGNOSIS — Z79891 Long term (current) use of opiate analgesic: Secondary | ICD-10-CM | POA: Insufficient documentation

## 2020-03-27 DIAGNOSIS — I70219 Atherosclerosis of native arteries of extremities with intermittent claudication, unspecified extremity: Secondary | ICD-10-CM

## 2020-03-27 DIAGNOSIS — Z79899 Other long term (current) drug therapy: Secondary | ICD-10-CM | POA: Diagnosis not present

## 2020-03-27 DIAGNOSIS — C9112 Chronic lymphocytic leukemia of B-cell type in relapse: Secondary | ICD-10-CM

## 2020-03-27 DIAGNOSIS — C9110 Chronic lymphocytic leukemia of B-cell type not having achieved remission: Secondary | ICD-10-CM

## 2020-03-27 DIAGNOSIS — Z451 Encounter for adjustment and management of infusion pump: Secondary | ICD-10-CM

## 2020-03-27 DIAGNOSIS — Z7984 Long term (current) use of oral hypoglycemic drugs: Secondary | ICD-10-CM | POA: Insufficient documentation

## 2020-03-27 DIAGNOSIS — G893 Neoplasm related pain (acute) (chronic): Secondary | ICD-10-CM

## 2020-03-27 DIAGNOSIS — R109 Unspecified abdominal pain: Secondary | ICD-10-CM

## 2020-03-27 DIAGNOSIS — Z95828 Presence of other vascular implants and grafts: Secondary | ICD-10-CM

## 2020-03-27 DIAGNOSIS — R1013 Epigastric pain: Secondary | ICD-10-CM | POA: Diagnosis not present

## 2020-03-27 DIAGNOSIS — R1084 Generalized abdominal pain: Secondary | ICD-10-CM

## 2020-03-27 DIAGNOSIS — G8929 Other chronic pain: Secondary | ICD-10-CM

## 2020-03-27 DIAGNOSIS — Z978 Presence of other specified devices: Secondary | ICD-10-CM

## 2020-03-27 MED ORDER — OXYCODONE HCL 10 MG PO TABS: 10.0000 mg | ORAL_TABLET | ORAL | 0 refills | Status: DC | PRN

## 2020-03-27 NOTE — Progress Notes (Signed)
Safety precautions to be maintained throughout the outpatient stay will include: orient to surroundings, keep bed in low position, maintain call bell within reach at all times, provide assistance with transfer out of bed and ambulation.  

## 2020-03-27 NOTE — Progress Notes (Signed)
Nursing Pain Medication Assessment:  Safety precautions to be maintained throughout the outpatient stay will include: orient to surroundings, keep bed in low position, maintain call bell within reach at all times, provide assistance with transfer out of bed and ambulation.  Medication Inspection Compliance: Pill count conducted under aseptic conditions, in front of the patient. Neither the pills nor the bottle was removed from the patient's sight at any time. Once count was completed pills were immediately returned to the patient in their original bottle.  Medication: Oxycodone IR Pill/Patch Count: 24 of 60 pills remain Pill/Patch Appearance: Markings consistent with prescribed medication Bottle Appearance: Standard pharmacy container. Clearly labeled. Filled Date: 101 / 24 / 2021 Last Medication intake:  Today

## 2020-03-28 ENCOUNTER — Inpatient Hospital Stay (HOSPITAL_BASED_OUTPATIENT_CLINIC_OR_DEPARTMENT_OTHER): Payer: Medicare Other | Admitting: Internal Medicine

## 2020-03-28 ENCOUNTER — Inpatient Hospital Stay: Payer: Medicare Other

## 2020-03-28 ENCOUNTER — Other Ambulatory Visit: Payer: Self-pay

## 2020-03-28 ENCOUNTER — Encounter: Payer: Self-pay | Admitting: Internal Medicine

## 2020-03-28 DIAGNOSIS — N183 Chronic kidney disease, stage 3 unspecified: Secondary | ICD-10-CM

## 2020-03-28 DIAGNOSIS — C9110 Chronic lymphocytic leukemia of B-cell type not having achieved remission: Secondary | ICD-10-CM | POA: Diagnosis not present

## 2020-03-28 DIAGNOSIS — N189 Chronic kidney disease, unspecified: Secondary | ICD-10-CM | POA: Diagnosis not present

## 2020-03-28 DIAGNOSIS — D631 Anemia in chronic kidney disease: Secondary | ICD-10-CM

## 2020-03-28 DIAGNOSIS — I70219 Atherosclerosis of native arteries of extremities with intermittent claudication, unspecified extremity: Secondary | ICD-10-CM

## 2020-03-28 DIAGNOSIS — C9112 Chronic lymphocytic leukemia of B-cell type in relapse: Secondary | ICD-10-CM

## 2020-03-28 LAB — IRON AND TIBC
Iron: 93 ug/dL (ref 45–182)
Saturation Ratios: 24 % (ref 17.9–39.5)
TIBC: 396 ug/dL (ref 250–450)
UIBC: 303 ug/dL

## 2020-03-28 LAB — CBC WITH DIFFERENTIAL/PLATELET
Abs Immature Granulocytes: 0.01 10*3/uL (ref 0.00–0.07)
Basophils Absolute: 0.1 10*3/uL (ref 0.0–0.1)
Basophils Relative: 2 %
Eosinophils Absolute: 0 10*3/uL (ref 0.0–0.5)
Eosinophils Relative: 1 %
HCT: 28.4 % — ABNORMAL LOW (ref 39.0–52.0)
Hemoglobin: 8.7 g/dL — ABNORMAL LOW (ref 13.0–17.0)
Immature Granulocytes: 0 %
Lymphocytes Relative: 42 %
Lymphs Abs: 1.9 10*3/uL (ref 0.7–4.0)
MCH: 34 pg (ref 26.0–34.0)
MCHC: 30.6 g/dL (ref 30.0–36.0)
MCV: 110.9 fL — ABNORMAL HIGH (ref 80.0–100.0)
Monocytes Absolute: 0.5 10*3/uL (ref 0.1–1.0)
Monocytes Relative: 12 %
Neutro Abs: 1.9 10*3/uL (ref 1.7–7.7)
Neutrophils Relative %: 43 %
Platelets: 134 10*3/uL — ABNORMAL LOW (ref 150–400)
RBC: 2.56 MIL/uL — ABNORMAL LOW (ref 4.22–5.81)
RDW: 15.7 % — ABNORMAL HIGH (ref 11.5–15.5)
WBC: 4.4 10*3/uL (ref 4.0–10.5)
nRBC: 0 % (ref 0.0–0.2)

## 2020-03-28 LAB — COMPREHENSIVE METABOLIC PANEL
ALT: 19 U/L (ref 0–44)
AST: 15 U/L (ref 15–41)
Albumin: 4.7 g/dL (ref 3.5–5.0)
Alkaline Phosphatase: 63 U/L (ref 38–126)
Anion gap: 12 (ref 5–15)
BUN: 35 mg/dL — ABNORMAL HIGH (ref 8–23)
CO2: 28 mmol/L (ref 22–32)
Calcium: 9.3 mg/dL (ref 8.9–10.3)
Chloride: 101 mmol/L (ref 98–111)
Creatinine, Ser: 1.28 mg/dL — ABNORMAL HIGH (ref 0.61–1.24)
GFR, Estimated: 60 mL/min — ABNORMAL LOW (ref >60)
Glucose, Bld: 137 mg/dL — ABNORMAL HIGH (ref 70–99)
Potassium: 4.2 mmol/L (ref 3.5–5.1)
Sodium: 141 mmol/L (ref 135–145)
Total Bilirubin: 0.6 mg/dL (ref 0.3–1.2)
Total Protein: 7.1 g/dL (ref 6.5–8.1)

## 2020-03-28 LAB — LACTATE DEHYDROGENASE: LDH: 104 U/L (ref 98–192)

## 2020-03-28 LAB — FERRITIN: Ferritin: 56 ng/mL (ref 24–336)

## 2020-03-28 MED ORDER — DARBEPOETIN ALFA 300 MCG/0.6ML IJ SOSY
300.0000 ug | PREFILLED_SYRINGE | Freq: Once | INTRAMUSCULAR | Status: AC
  Administered 2020-03-28: 300 ug via SUBCUTANEOUS
  Filled 2020-03-28: qty 0.6

## 2020-03-28 NOTE — Assessment & Plan Note (Addendum)
#   CLL/SLL- relapsed most recently s/p Gazyva x6 cycles [AUG 2021] OCT 2021-improved mediastinal adenopathy ~2.5cm; ~3cm retroperitoneal adenopathy-there is no splenomegaly.  Continue surveillance; STABLE.   #Chronic abdominal pain-unclear etiology-STABLE; on pain pump as per pain management.   #Hyperkalemia-4.6 today- /CKD-III-secondary renal insufficiency/creatinine 1.3- [baseline 1.4; Dr.Lateef]; STABLE; continue LoKelma as per Nephrology.   # Anemia-hemoglobin 8.7 ;Secondary to CKD versus less likley progressive leukemia -STABLE; proceed with Aranesp today. Awaiting on iron panel; if low would recommend adding IV iron.   # DISPOSITION:  # Aranesp today # in 2 weeks- H&H- aranesp # 4 weeks- H&H- aranesp # 6 weeks- H&H-Aranesp # 8 weeks- MD; labs- cbc/cmpLDH; HOLD tube;possible aranesp;- Dr.B

## 2020-03-28 NOTE — Progress Notes (Signed)
Should there [facility] [place] OFFICE PROGRESS NOTE  Patient Care Team: [name], MD as PCP - General (Internal Medicine) Cammie Sickle, MD as Consulting Physician (Hematology and Oncology) Borders, Kirt Boys, NP as Nurse Practitioner (Hospice and Palliative Medicine) Verlon Au, NP as Nurse Practitioner (Hematology and Oncology) Jacquelin Hawking, NP as Nurse Practitioner (Oncology) Lonell Face, NP as Nurse Practitioner (Neurosurgery) Milinda Pointer, MD as Consulting Physician (Pain Medicine)  Cancer Staging No matching staging information was found for the patient.   Oncology History Overview Note  # 2006- CLL STAGE IV; MAY 2011- WBC- 57K;Platelets-99;Hb-12/CT Bulky LN; BMBx- 80% Invol; del 11; START Benda-Ritux x4 [finished Sep 2011];   # July 2015-Progression; Sep 2015-START ibrutinib; CT scan DEC 2015- Improvement LN; Cont Ibrutinib 132m/d; NOV 2016 CT- 1-2CM LN [mild progression compared to Dec 2015];NOV 2016- FISH peripheral blood- NO MUTATIONS/CD-38 Positive; NOV 7th- CONT IBRUTINIB 2 pills/day; CT AUG 2017- STABLE;  DEC 6th PET- Mild RP LN/ Retrocrural LN  # OFF ibrutinib [? intol]- sep 2019- Jan 16th 2020; Re-start Ibrutinib; September 2020-stop ibrutinib [poor tolerance/worsening anemia]  # Jan 16th 2020- start aranesp; HOLD while on [place]   # MARCH 11th 2021-[name]  # SEP 2021- prostate enlargement on CT scan- UA-NEG; PSA- WNL; s/p Dr.Wolff.    # DEC 2019- PAIN CONTRACT  # PALLIATIVE CARE- 06/21/2019-  # October 2019-bone marrow biopsy [worsening anemia]-question dyserythropoietic changes/small clone of CLL;  # [facility] HEM- NEG.  SA skin infection [Oct 2016] s/p clinda -------------------------------------------------------    DIAGNOSIS: CLL  STAGE:   IV      ;GOALS: palliative  CURRENT/MOST RECENT THERAPY : Gazyva [C]   CLL (chronic lymphocytic leukemia) ([place]  07/08/2019 -  Chemotherapy   The patient had  obinutuzumab (GAZYVA) 100 mg in sodium chloride 0.9 % 100 mL (0.9615 mg/mL) chemo infusion, 100 mg, Intravenous, Once, 6 of 6 cycles Administration: 100 mg (07/08/2019), 900 mg (07/09/2019), 1,000 mg (07/26/2019), 1,000 mg (08/16/2019), 1,000 mg (08/02/2019), 1,000 mg (09/13/2019), 1,000 mg (10/11/2019), 1,000 mg (11/09/2019), 1,000 mg (12/07/2019)  for chemotherapy treatment.      INTERVAL HISTORY:  FJABRIL [id number].[id number].  male pleasant patient above history of CLL and chronic abdominal pain of unclear etiology; anemia- ckd/hyperkalemia currently  is here for follow-up.  Patient is currently on surveillance for his CLL.  In the interim patient had started the  pain pump appx 1 months ago.  He states his pain is not any better.  He has an appointment with pain clinic tomorrow.  Complains of poor appetite.  Positive for nausea no vomiting.  No swelling in the legs.  Review of Systems  Constitutional: Positive for malaise/fatigue. Negative for chills, diaphoresis and fever.  HENT: Negative for nosebleeds and sore throat.   Eyes: Negative for double vision.  Respiratory: Positive for shortness of breath. Negative for cough, hemoptysis, sputum production and wheezing.   Cardiovascular: Negative for chest pain, palpitations, orthopnea and leg swelling.  Gastrointestinal: Positive for abdominal pain, constipation and nausea. Negative for blood in stool, diarrhea, heartburn, melena and vomiting.  Genitourinary: Negative for dysuria, frequency and urgency.  Skin: Negative.  Negative for itching and rash.  Neurological: Negative for dizziness, tingling, focal weakness, weakness and headaches.  Endo/Heme/Allergies: Does not bruise/bleed easily.  Psychiatric/Behavioral: Negative for depression. The patient is not nervous/anxious and does not have insomnia.       PAST MEDICAL HISTORY :  Past Medical History:  Diagnosis Date  .  CLL (chronic lymphocytic leukemia) ([place])   . Constipation   . Depression   .  Diabetes mellitus without complication ([place])   . GERD (gastroesophageal reflux disease)   . Hematuria   . Hyperlipidemia   . Hypertension   . Therapeutic opioid induced constipation     PAST SURGICAL HISTORY :   Past Surgical History:  Procedure Laterality Date  . CATARACT EXTRACTION W/ INTRAOCULAR LENS IMPLANT Bilateral   . CHOLECYSTECTOMY  1983  . COLONOSCOPY WITH PROPOFOL N/A 10/27/2017   Procedure: COLONOSCOPY WITH PROPOFOL;  Surgeon: Manya Silvas, MD;  [facility] END[facility]COPY;  Service: Endoscopy;  Laterality: N/A;  . ESOPHAGOGASTRODUODENOSCOPY (EGD) WITH PROPOFOL N/A 11/20/2016   Procedure: ESOPHAGOGASTRODUODENOSCOPY (EGD) WITH PROPOFOL;  Surgeon: Manya Silvas, MD;  Location: [facility] ENDOSCOPY;  Service: Endoscopy;  Laterality: N/A;  . ESOPHAGOGASTRODUODENOSCOPY (EGD) WITH PROPOFOL N/A 10/27/2017   Procedure: ESOPHAGOGASTRODUODENOSCOPY (EGD) WITH PROPOFOL;  Surgeon: Manya Silvas, MD;  Location[facility] ENDOSCOPY;  Service: Endoscopy;  Laterality: N/A;  . INTRATHECAL PUMP IMPLANT Left 02/07/2020   Procedure: INTRATHECAL PUMP & CATHETER IMPLANT;  Surgeon: Deetta Perla, MD;  Location: [facility] ORS;  Service: Neurosurgery;  Laterality: Left;  . LOWER EXTREMITY ANGIOGRAPHY Right 05/19/2017   Procedure: LOWER EXTREMITY ANGIOGRAPHY;  Surgeon: Algernon Huxley, MD;  Location: [facility] CV LAB;  Service: Cardiovascular;  Laterality: Right;  . LOWER EXTREMITY ANGIOGRAPHY Left 08/10/2018   Procedure: LOWER EXTREMITY ANGIOGRAPHY;  Surgeon: Algernon Huxley, MD;  Location: [place] CV LAB;  Service: Cardiovascular;  Laterality: Left;    FAMILY HISTORY :   Family History  Problem Relation Age of Onset  . Hypertension Sister     SOCIAL HISTORY:   Social History   Tobacco Use  . Smoking status: Current Every Day Smoker    Packs/day: 0.50    Years: 47.00    Pack years: 23.50    Types: Cigarettes  . Smokeless tobacco: Never Used  Vaping Use  . Vaping Use: Never used  Substance Use  Topics  . Alcohol use: Yes    Alcohol/week: 1.0 standard drink    Types: 1 Glasses of wine per week    Comment:  almost none in last 6 months  . Drug use: No    ALLERGIES:  has No Known Allergies.  MEDICATIONS:  Current Outpatient Medications  Medication Sig Dispense Refill  . atorvastatin (LIPITOR) 10 MG tablet Take 1 tablet (10 mg total) by mouth daily. 30 tablet 11  . clopidogrel (PLAVIX) 75 MG tablet Take 75 mg by mouth daily.     . Ensure (ENSURE) Take 237 mLs by mouth 3 (three) times daily between meals.     Marland Kitchen esomeprazole (NEXIUM) 40 MG capsule Take 1 capsule (40 mg total) by mouth daily. 30 capsule 1  . folic acid (FOLVITE) 1 MG tablet Take 1 tablet (1 mg total) by mouth daily. 90 tablet 1  . metFORMIN (GLUCOPHAGE) 1000 MG tablet Take 1,000 mg by mouth daily with breakfast.     . methocarbamol (ROBAXIN) 500 MG tablet Take 1 tablet (500 mg total) by mouth every 6 (six) hours as needed for muscle spasms. 80 tablet 0  . naloxegol oxalate (MOVANTIK) 25 MG TABS tablet Take 1 tablet (25 mg total) by mouth daily. 30 tablet 0  . [START ON 04/02/2020] Oxycodone HCl 10 MG TABS Take 1 tablet (10 mg total) by mouth every 4 (four) hours as needed for up to 10 days. Maximum of 6/day. 60 tablet 0  . [START  ON 05/18/2020] PAIN MANAGEMENT IT PUMP REFILL 1 each by Intrathecal route once for 1 dose. Medication: 1st Opioid: PF-Fentanyl 2000 mcg/ml Local Anesthetic: PF-Bupivacaine 20 mg/ml Volume: 40 ml Appointment Date: 03/02/2020 (Patient taking differently: 1 each by Intrathecal route once. Medication: 1st Opioid: PF-Fentanyl 2000 mcg/ml Local Anesthetic: PF-Bupivacaine 20 mg/ml Volume: 40 ml 24 hour dose  Fentanyl 700.4 mcg/day Bupivicaine 7.004 mg/day mg/day PTM IN USE) 1 each 0  . pantoprazole (PROTONIX) 40 MG tablet Take 40 mg by mouth at bedtime.    . sucralfate (CARAFATE) 1 g tablet Take 1 tablet (1 g total) by mouth 4 (four) times daily -  with meals and at bedtime. 120 tablet 2  .  naloxone (NARCAN) 2 MG/2ML injection Inject 1 mL (1 mg total) into the muscle as needed for up to 2 doses (for opioid overdose). In case of emergency (overdose), inject into muscle of upper arm or leg and call 911. (Patient not taking: Reported on 03/28/2020) 2 mL 0   No current facility-administered medications for this visit.    PHYSICAL EXAMINATION: ECOG PERFORMANCE STATUS: 1 - Symptomatic but completely ambulatory  BP (!) 120/47   Pulse 81   Temp 98.6 F (37 C) (Tympanic)   Resp 18   Ht 5' 8"  (1.727 m)   Wt 156 lb 3.2 oz (70.9 kg)   SpO2 100%   BMI 23.75 kg/m   Filed Weights   03/28/20 1011  Weight: 156 lb 3.2 oz (70.9 kg)    Physical Exam Constitutional:      Comments: He is alone.  Appears pale.  HENT:     Head: Normocephalic and atraumatic.     Mouth/Throat:     Pharynx: No oropharyngeal exudate.  Eyes:     Pupils: Pupils are equal, round, and reactive to light.  Cardiovascular:     Rate and Rhythm: Normal rate and regular rhythm.  Pulmonary:     Effort: No respiratory distress.     Breath sounds: No wheezing.  Abdominal:     General: Bowel sounds are normal. There is no distension.     Palpations: Abdomen is soft. There is no mass.     Tenderness: There is no abdominal tenderness. There is no guarding or rebound.  Musculoskeletal:        General: No tenderness. Normal range of motion.     Cervical back: Normal range of motion and neck supple.  Skin:    General: Skin is warm.     Coloration: Skin is pale.     Comments: Multiple bruises noted in bilateral upper extremity.  Neurological:     Mental Status: He is alert and oriented to person, place, and time.  Psychiatric:        Mood and Affect: Affect normal.       LABORATORY DATA:  I have reviewed the data as listed    Component Value Date/Time   NA 141 03/28/2020 0951   NA 142 05/03/2014 1139   K 4.2 03/28/2020 0951   K 4.7 05/03/2014 1139   CL 101 03/28/2020 0951   CL 110 (H) 05/03/2014 1139    CO2 28 03/28/2020 0951   CO2 24 05/03/2014 1139   GLUCOSE 137 (H) 03/28/2020 0951   GLUCOSE 98 05/03/2014 1139   BUN 35 (H) 03/28/2020 0951   BUN 20 (H) 05/03/2014 1139   CREATININE 1.28 (H) 03/28/2020 0951   CREATININE 1.22 08/09/2014 1122   CALCIUM 9.3 03/28/2020 0951   CALCIUM 8.6 05/03/2014 1139  PROT 7.1 03/28/2020 0951   PROT 7.5 05/03/2014 1139   ALBUMIN 4.7 03/28/2020 0951   ALBUMIN 4.1 05/03/2014 1139   AST 15 03/28/2020 0951   AST 15 05/03/2014 1139   ALT 19 03/28/2020 0951   ALT 26 05/03/2014 1139   ALKPHOS 63 03/28/2020 0951   ALKPHOS 94 05/03/2014 1139   BILITOT 0.6 03/28/2020 0951   BILITOT 0.5 05/03/2014 1139   GFRNONAA 60 (L) 03/28/2020 0951   GFRNONAA >60 08/09/2014 1122   GFRAA 55 (L) 01/24/2020 1156   GFRAA >60 08/09/2014 1122    No results found for: SPEP, UPEP  Lab Results  Component Value Date   WBC 4.4 03/28/2020   NEUTROABS 1.9 03/28/2020   HGB 8.7 (L) 03/28/2020   HCT 28.4 (L) 03/28/2020   MCV 110.9 (H) 03/28/2020   PLT 134 (L) 03/28/2020      Chemistry      Component Value Date/Time   NA 141 03/28/2020 0951   NA 142 05/03/2014 1139   K 4.2 03/28/2020 0951   K 4.7 05/03/2014 1139   CL 101 03/28/2020 0951   CL 110 (H) 05/03/2014 1139   CO2 28 03/28/2020 0951   CO2 24 05/03/2014 1139   BUN 35 (H) 03/28/2020 0951   BUN 20 (H) 05/03/2014 1139   CREATININE 1.28 (H) 03/28/2020 0951   CREATININE 1.22 08/09/2014 1122      Component Value Date/Time   CALCIUM 9.3 03/28/2020 0951   CALCIUM 8.6 05/03/2014 1139   ALKPHOS 63 03/28/2020 0951   ALKPHOS 94 05/03/2014 1139   AST 15 03/28/2020 0951   AST 15 05/03/2014 1139   ALT 19 03/28/2020 0951   ALT 26 05/03/2014 1139   BILITOT 0.6 03/28/2020 0951   BILITOT 0.5 05/03/2014 1139       RADIOGRAPHIC STUDIES: I have personally reviewed the radiological images as listed and agreed with the findings in the report. No results found.   ASSESSMENT & PLAN:  CLL (chronic lymphocytic  leukemia) ([place]) # CLL/SLL- relapsed most recently s/p Gazyva x6 cycles [AUG 2021] OCT 2021-improved mediastinal adenopathy ~2.5cm; ~3cm retroperitoneal adenopathy-there is no splenomegaly.  Continue surveillance; STABLE.   #Chronic abdominal pain-unclear etiology-STABLE; on pain pump as per pain management.   #Hyperkalemia-4.6 today- /CKD-III-secondary renal insufficiency/creatinine 1.3- [baseline 1.4; Dr.Lateef]; STABLE; continue LoKelma as per Nephrology.   # Anemia-hemoglobin 8.7 ;Secondary to CKD versus less likley progressive leukemia -STABLE; proceed with Aranesp today. Awaiting on iron panel; if low would recommend adding IV iron.   # DISPOSITION:  # Aranesp today # in 2 weeks- H&H- aranesp # 4 weeks- H&H- aranesp # 6 weeks- H&H-Aranesp # 8 weeks- MD; labs- cbc/cmpLDH; HOLD tube;possible aranesp;- Dr.B    No orders of the defined types were placed in this encounter.  All questions were answered. The patient knows to call the clinic with any problems, questions or concerns.      Cammie Sickle, MD 03/28/2020 11:05 AM

## 2020-03-29 ENCOUNTER — Encounter: Payer: Self-pay | Admitting: Pain Medicine

## 2020-03-29 ENCOUNTER — Ambulatory Visit: Payer: Medicare Other | Attending: Pain Medicine | Admitting: Pain Medicine

## 2020-03-29 ENCOUNTER — Other Ambulatory Visit: Payer: Self-pay

## 2020-03-29 VITALS — BP 111/73 | HR 89 | Temp 98.3°F | Resp 18 | Ht 68.0 in | Wt 156.0 lb

## 2020-03-29 DIAGNOSIS — R109 Unspecified abdominal pain: Secondary | ICD-10-CM

## 2020-03-29 DIAGNOSIS — G8929 Other chronic pain: Secondary | ICD-10-CM

## 2020-03-29 DIAGNOSIS — Z79899 Other long term (current) drug therapy: Secondary | ICD-10-CM | POA: Diagnosis not present

## 2020-03-29 DIAGNOSIS — Z7901 Long term (current) use of anticoagulants: Secondary | ICD-10-CM

## 2020-03-29 DIAGNOSIS — C8590 Non-Hodgkin lymphoma, unspecified, unspecified site: Secondary | ICD-10-CM | POA: Insufficient documentation

## 2020-03-29 DIAGNOSIS — Z7984 Long term (current) use of oral hypoglycemic drugs: Secondary | ICD-10-CM | POA: Insufficient documentation

## 2020-03-29 DIAGNOSIS — C9110 Chronic lymphocytic leukemia of B-cell type not having achieved remission: Secondary | ICD-10-CM | POA: Diagnosis not present

## 2020-03-29 DIAGNOSIS — Z978 Presence of other specified devices: Secondary | ICD-10-CM

## 2020-03-29 DIAGNOSIS — F1190 Opioid use, unspecified, uncomplicated: Secondary | ICD-10-CM

## 2020-03-29 DIAGNOSIS — G894 Chronic pain syndrome: Secondary | ICD-10-CM | POA: Insufficient documentation

## 2020-03-29 DIAGNOSIS — Z451 Encounter for adjustment and management of infusion pump: Secondary | ICD-10-CM | POA: Insufficient documentation

## 2020-03-29 DIAGNOSIS — R1013 Epigastric pain: Secondary | ICD-10-CM | POA: Diagnosis not present

## 2020-03-29 DIAGNOSIS — G893 Neoplasm related pain (acute) (chronic): Secondary | ICD-10-CM | POA: Diagnosis not present

## 2020-03-29 DIAGNOSIS — I70219 Atherosclerosis of native arteries of extremities with intermittent claudication, unspecified extremity: Secondary | ICD-10-CM

## 2020-03-29 DIAGNOSIS — Z7902 Long term (current) use of antithrombotics/antiplatelets: Secondary | ICD-10-CM | POA: Diagnosis not present

## 2020-03-29 DIAGNOSIS — R1084 Generalized abdominal pain: Secondary | ICD-10-CM

## 2020-03-29 DIAGNOSIS — Z9049 Acquired absence of other specified parts of digestive tract: Secondary | ICD-10-CM | POA: Diagnosis not present

## 2020-03-29 DIAGNOSIS — C8300 Small cell B-cell lymphoma, unspecified site: Secondary | ICD-10-CM

## 2020-03-29 DIAGNOSIS — Z95828 Presence of other vascular implants and grafts: Secondary | ICD-10-CM

## 2020-03-29 DIAGNOSIS — Z79891 Long term (current) use of opiate analgesic: Secondary | ICD-10-CM | POA: Diagnosis not present

## 2020-03-29 LAB — SAMPLE TO BLOOD BANK

## 2020-03-29 NOTE — Patient Instructions (Addendum)
____________________________________________________________________________________________  Preparing for Procedure with Sedation  Procedure appointments are limited to planned procedures:  No Prescription Refills.  No disability issues will be discussed.  No medication changes will be discussed.  Instructions:  Oral Intake: Do not eat or drink anything for at least 8 hours prior to your procedure. (Exception: Blood Pressure Medication. See below.)  Transportation: Unless otherwise stated by your physician, you may drive yourself after the procedure.  Blood Pressure Medicine: Do not forget to take your blood pressure medicine with a sip of water the morning of the procedure. If your Diastolic (lower reading)is above 100 mmHg, elective cases will be cancelled/rescheduled.  Blood thinners: These will need to be stopped for procedures. Notify our staff if you are taking any blood thinners. Depending on which one you take, there will be specific instructions on how and when to stop it.  Diabetics on insulin: Notify the staff so that you can be scheduled 1st case in the morning. If your diabetes requires high dose insulin, take only  of your normal insulin dose the morning of the procedure and notify the staff that you have done so.  Preventing infections: Shower with an antibacterial soap the morning of your procedure.  Build-up your immune system: Take 1000 mg of Vitamin C with every meal (3 times a day) the day prior to your procedure.  Antibiotics: Inform the staff if you have a condition or reason that requires you to take antibiotics before dental procedures.  Pregnancy: If you are pregnant, call and cancel the procedure.  Sickness: If you have a cold, fever, or any active infections, call and cancel the procedure.  Arrival: You must be in the facility at least 30 minutes prior to your scheduled procedure.  Children: Do not bring children with you.  Dress appropriately:  Bring dark clothing that you would not mind if they get stained.  Valuables: Do not bring any jewelry or valuables.  Reasons to call and reschedule or cancel your procedure: (Following these recommendations will minimize the risk of a serious complication.)  Surgeries: Avoid having procedures within 2 weeks of any surgery. (Avoid for 2 weeks before or after any surgery).  Flu Shots: Avoid having procedures within 2 weeks of a flu shots or . (Avoid for 2 weeks before or after immunizations).  Barium: Avoid having a procedure within 7-10 days after having had a radiological study involving the use of radiological contrast. (Myelograms, Barium swallow or enema study).  Heart attacks: Avoid any elective procedures or surgeries for the initial 6 months after a "Myocardial Infarction" (Heart Attack).  Blood thinners: It is imperative that you stop these medications before procedures. Let us know if you if you take any blood thinner.   Infection: Avoid procedures during or within two weeks of an infection (including chest colds or gastrointestinal problems). Symptoms associated with infections include: Localized redness, fever, chills, night sweats or profuse sweating, burning sensation when voiding, cough, congestion, stuffiness, runny nose, sore throat, diarrhea, nausea, vomiting, cold or Flu symptoms, recent or current infections. It is specially important if the infection is over the area that we intend to treat.  Heart and lung problems: Symptoms that may suggest an active cardiopulmonary problem include: cough, chest pain, breathing difficulties or shortness of breath, dizziness, ankle swelling, uncontrolled high or unusually low blood pressure, and/or palpitations. If you are experiencing any of these symptoms, cancel your procedure and contact your primary care physician for an evaluation.  Remember:  Regular Business hours are:  Monday to Thursday 8:00 AM to 4:00 PM  Provider's  Schedule: Milinda Pointer, MD:  Procedure days: Tuesday and Thursday 7:30 AM to 4:00 PM  Gillis Santa, MD:  Procedure days: Monday and Wednesday 7:30 AM to 4:00 PM ____________________________________________________________________________________________   ____________________________________________________________________________________________  General Risks and Possible Complications  Patient Responsibilities: It is important that you read this as it is part of your informed consent. It is our duty to inform you of the risks and possible complications associated with treatments offered to you. It is your responsibility as a patient to read this and to ask questions about anything that is not clear or that you believe was not covered in this document.  Patients Rights: You have the right to refuse treatment. You also have the right to change your mind, even after initially having agreed to have the treatment done. However, under this last option, if you wait until the last second to change your mind, you may be charged for the materials used up to that point.  Introduction: Medicine is not an Chief Strategy Officer. Everything in Medicine, including the lack of treatment(s), carries the potential for danger, harm, or loss (which is by definition: Risk). In Medicine, a complication is a secondary problem, condition, or disease that can aggravate an already existing one. All treatments carry the risk of possible complications. The fact that a side effects or complications occurs, does not imply that the treatment was conducted incorrectly. It must be clearly understood that these can happen even when everything is done following the highest safety standards.  No treatment: You can choose not to proceed with the proposed treatment alternative. The PRO(s) would include: avoiding the risk of complications associated with the therapy. The CON(s) would include: not getting any of the treatment  benefits. These benefits fall under one of three categories: diagnostic; therapeutic; and/or palliative. Diagnostic benefits include: getting information which can ultimately lead to improvement of the disease or symptom(s). Therapeutic benefits are those associated with the successful treatment of the disease. Finally, palliative benefits are those related to the decrease of the primary symptoms, without necessarily curing the condition (example: decreasing the pain from a flare-up of a chronic condition, such as incurable terminal cancer).  General Risks and Complications: These are associated to most interventional treatments. They can occur alone, or in combination. They fall under one of the following six (6) categories: no benefit or worsening of symptoms; bleeding; infection; nerve damage; allergic reactions; and/or death. 1. No benefits or worsening of symptoms: In Medicine there are no guarantees, only probabilities. No healthcare provider can ever guarantee that a medical treatment will work, they can only state the probability that it may. Furthermore, there is always the possibility that the condition may worsen, either directly, or indirectly, as a consequence of the treatment. 2. Bleeding: This is more common if the patient is taking a blood thinner, either prescription or over the counter (example: Goody Powders, Fish oil, Aspirin, Garlic, etc.), or if suffering a condition associated with impaired coagulation (example: Hemophilia, cirrhosis of the liver, low platelet counts, etc.). However, even if you do not have one on these, it can still happen. If you have any of these conditions, or take one of these drugs, make sure to notify your treating physician. 3. Infection: This is more common in patients with a compromised immune system, either due to disease (example: diabetes, cancer, human immunodeficiency virus [HIV], etc.), or due to medications or treatments (example: therapies used to treat  cancer and  rheumatological diseases). However, even if you do not have one on these, it can still happen. If you have any of these conditions, or take one of these drugs, make sure to notify your treating physician. 4. Nerve Damage: This is more common when the treatment is an invasive one, but it can also happen with the use of medications, such as those used in the treatment of cancer. The damage can occur to small secondary nerves, or to large primary ones, such as those in the spinal cord and brain. This damage may be temporary or permanent and it may lead to impairments that can range from temporary numbness to permanent paralysis and/or brain death. 5. Allergic Reactions: Any time a substance or material comes in contact with our body, there is the possibility of an allergic reaction. These can range from a mild skin rash (contact dermatitis) to a severe systemic reaction (anaphylactic reaction), which can result in death. 6. Death: In general, any medical intervention can result in death, most of the time due to an unforeseen complication. ____________________________________________________________________________________________  ____________________________________________________________________________________________  Blood Thinners  IMPORTANT NOTICE:  If you take any of these, make sure to notify the nursing staff.  Failure to do so may result in injury.  Recommended time intervals to stop and restart blood-thinners, before & after invasive procedures  Generic Name Brand Name Stop Time. Must be stopped at least this long before procedures. After procedures, wait at least this long before re-starting.  Abciximab Reopro 15 days 2 hrs  Alteplase Activase 10 days 10 days  Anagrelide Agrylin    Apixaban Eliquis 3 days 6 hrs  Cilostazol Pletal 3 days 5 hrs  Clopidogrel Plavix 7-10 days 2 hrs  Dabigatran Pradaxa 5 days 6 hrs  Dalteparin Fragmin 24 hours 4 hrs  Dipyridamole Aggrenox  11days 2 hrs  Edoxaban Lixiana; Savaysa 3 days 2 hrs  Enoxaparin  Lovenox 24 hours 4 hrs  Eptifibatide Integrillin 8 hours 2 hrs  Fondaparinux  Arixtra 72 hours 12 hrs  Prasugrel Effient 7-10 days 6 hrs  Reteplase Retavase 10 days 10 days  Rivaroxaban Xarelto 3 days 6 hrs  Ticagrelor Brilinta 5-7 days 6 hrs  Ticlopidine Ticlid 10-14 days 2 hrs  Tinzaparin Innohep 24 hours 4 hrs  Tirofiban Aggrastat 8 hours 2 hrs  Warfarin Coumadin 5 days 2 hrs   Other medications with blood-thinning effects  Product indications Generic (Brand) names Note  Cholesterol Lipitor Stop 4 days before procedure  Blood thinner (injectable) Heparin (LMW or LMWH Heparin) Stop 24 hours before procedure  Cancer Ibrutinib (Imbruvica) Stop 7 days before procedure  Malaria/Rheumatoid Hydroxychloroquine (Plaquenil) Stop 11 days before procedure  Thrombolytics  10 days before or after procedures   Over-the-counter (OTC) Products with blood-thinning effects  Product Common names Stop Time  Aspirin > 325 mg Goody Powders, Excedrin, etc. 11 days  Aspirin ? 81 mg  7 days  Fish oil  4 days  Garlic supplements  7 days  Ginkgo biloba  36 hours  Ginseng  24 hours  NSAIDs Ibuprofen, Naprosyn, etc. 3 days  Vitamin E  4 days   ____________________________________________________________________________________________ Stop Plavix 7 days before the procedure.

## 2020-03-29 NOTE — Progress Notes (Signed)
PROVIDER NOTE: Information contained herein reflects review and annotations entered in association with encounter. Interpretation of such information and data should be left to medically-trained personnel. Information provided to patient can be located elsewhere in the medical record under "Patient Instructions". Document created using STT-dictation technology, any transcriptional errors that may result from process are unintentional.    Patient: James [name]  Service Category: Procedure  Provider: Gaspar Cola, MD  DOB: 1950/09/14  DOS: 03/29/2020  Location: [facility]  MRN: [id number]  Setting: Ambulatory - outpatient  Referring Provider: [name], MD  Type: Established Patient  Specialty: Interventional Pain Management  PCP: [name], MD   Primary Reason for Visit: Interventional Pain Management Treatment. CC: Abdominal Pain (epigastric)  Procedure:          Intrathecal Drug Delivery System (IDDS):  Type: Analysis & Programming [phone]) Increase basal rate by 28.6%.  Keep PCA mode the same. Region: Abdominal Laterality: Left  Type of Pump: Medtronic Synchromed II Delivery Route: Intrathecal Type of Pain Treated: Neuropathic/Nociceptive Primary Medication Class: Opioid/opiate  Medication, Concentration, Infusion Program, & Delivery Rate: Please see scanned programming printout.   Indications: 1. Chronic pain syndrome   2. Cancer-related pain   3. Chronic abdominal pain   4. Abdominal wall pain in epigastric region   5. CLL (chronic lymphocytic leukemia) ([place])   6. Generalized abdominal pain   7. Malignant lymphoma, small lymphocytic ([place])   8. Physical tolerance to opiate drug   9. Presence of implanted infusion pump (Medtronic)   10. Presence of intrathecal pump (Medtronic)   11. Encounter for adjustment and management of infusion pump    Pain Assessment: Self-Reported Pain Score: 8 /10             Reported level is compatible with  observation.         Since the patient continues to demonstrate significant pain in the epigastric region, I will go ahead and schedule him for a celiac plexus block where I will try to go to the splanchnic nerve area at the T12 level to see if I can provide him with good relief of the pain.  I will start by injecting the local anesthetic and some steroids, but will also wait and see what type of benefit he gets from the injection while he is still on the table.  If he demonstrates significant benefit, then we may proceed with alcohol neurolysis at that time.  If that does not provide the patient with good relief of the pain, that would strongly suggest that we need to examine his thoracic region for possible problems arising from that area.  Pharmacotherapy Assessment  Analgesic: Oxycodone IR 10m, 1 tab PO q 4 hrs (60 mg/day of oxycodone IR) (90 MME) + morphine ER 30 mg twice daily (60 mg/day of morphine ER) (60 MME) (150 MME/day). Highest recorded MME/day: 150 mg/day 90 day average MME/day: 77.89 mg/day   Monitoring: [place] PMP: PDMP reviewed during this encounter.       Pharmacotherapy: No side-effects or adverse reactions reported. Compliance: No problems identified. Effectiveness: Clinically acceptable. Plan: Refer to "POC".  UDS: No results found for: SUMMARY  Intrathecal Pump Therapy Assessment  Manufacturer: Medtronic Synchromed Type: Programmable Volume: 40 mL reservoir MRI compatibility: Yes   Drug content:  Primary Medication Class:Opioid Primary Medication:PF-Fentanyl(2000 mcg/mL) Secondary Medication:PF-Bupivacaine(20 mg/mL) Other Medication:None  PTM(PCA) mode: PTM dose:25 mcg bolus Duration of bolus:1 minute Lockout interval:4 hours Maximum 24-hour doses:6   Programming:  Type: Simple continuous.  See pump readout for details.   Changes:  Medication Change: None at this point Rate Change: Today we have reviewed the programming on his intrathecal pump  and he has been using his PCA mode to the maximum meaning that he had a total of 847.4 mcg/day, and he is still complaining of insufficient analgesia.  For this reason, today we have increased his basal rate to 900 mcg/day. PCA mode will be kept the same with allowed activations of 4/day to 6/day (1 every 4 hours). He should be able to get a maximum of 847.4 mcg/day of fentanyl and 8.474 mg/day of bupivacaine.  The concentration and dose of fentanyl and bupivacaine that he can get per dose will remain the same as before.  Reported side-effects or adverse reactions: None reported  Effectiveness: Ineffective Clinically meaningful improvement in function (CMIF): Medication does not meet basic CMIF  Plan: Analysis and programming.  Today I have increased the amount of the basal rate by 28.6%.  Since he still having quite a bit of pain, I plan to bring him back for a diagnostic bilateral celiac plexus block under fluoroscopic guidance and IV sedation.  If this provides him with good relief of the pain for the duration of the local anesthetic, we will probably set him up for a neurolytic block using phenol 6%.  Pre-op H&P Assessment:  Mr. Falletta is a 69 y.o. (year old), male patient, seen today for interventional treatment. He  has a past surgical history that includes Cholecystectomy (1983); Esophagogastroduodenoscopy (egd) with propofol (N/A, 11/20/2016); Cataract extraction w/ intraocular lens implant (Bilateral); Lower Extremity Angiography (Right, 05/19/2017); Esophagogastroduodenoscopy (egd) with propofol (N/A, 10/27/2017); Colonoscopy with propofol (N/A, 10/27/2017); Lower Extremity Angiography (Left, 08/10/2018); and Intrathecal pump implant (Left, 02/07/2020). Mr. Swopes has a current medication list which includes the following prescription(s): atorvastatin, clopidogrel, ensure, esomeprazole, folic acid, metformin, methocarbamol, naloxegol oxalate, naloxone, [START ON 04/02/2020] oxycodone hcl, [START ON  05/18/2020] PAIN MANAGEMENT IT PUMP REFILL, pantoprazole, and sucralfate. His primarily concern today is the Abdominal Pain (epigastric)  Initial Vital Signs:  Pulse/HCG Rate: 89  Temp: 98.3 F (36.8 C) Resp: 18 BP: 111/73 SpO2: 100 %  BMI: Estimated body mass index is 23.72 kg/m as calculated from the following:   Height as of this encounter: _0  (1.727 m).   Weight as of this encounter: 156 lb (70.8 kg).  Risk Assessment: Allergies: Reviewed. He has No Known Allergies.  Allergy Precautions: None required Coagulopathies: Reviewed. None identified.  Blood-thinner therapy: None at this time Active Infection(s): Reviewed. None identified. Mr. Razzano is afebrile  Site Confirmation: Mr. Waddington was asked to confirm the procedure and laterality before marking the site Procedure checklist: Completed Consent: Before the procedure and under the influence of no sedative(s), amnesic(s), or anxiolytics, the patient was informed of the treatment options, risks and possible complications. To fulfill our ethical and legal obligations, as recommended by the American Medical Association's Code of Ethics, I have informed the patient of my clinical impression; the nature and purpose of the treatment or procedure; the risks, benefits, and possible complications of the intervention; the alternatives, including doing nothing; the risk(s) and benefit(s) of the alternative treatment(s) or procedure(s); and the risk(s) and benefit(s) of doing nothing.  Mr. Bas was provided with information about the general risks and possible complications associated with most interventional procedures. These include, but are not limited to: failure to achieve desired goals, infection, bleeding, organ or nerve damage, allergic reactions, paralysis, and/or death.  In addition, he was  informed of those risks and possible complications associated to this particular procedure, which include, but are not limited to: damage to the  implant; failure to decrease pain; local, systemic, or serious CNS infections, intraspinal abscess with possible cord compression and paralysis, or life-threatening such as meningitis; bleeding; organ damage; nerve injury or damage with subsequent sensory, motor, and/or autonomic system dysfunction, resulting in transient or permanent pain, numbness, and/or weakness of one or several areas of the body; allergic reactions, either minor or major life-threatening, such as anaphylactic or anaphylactoid reactions.  Furthermore, Mr. Brenning was informed of those risks and complications associated with the medications. These include, but are not limited to: allergic reactions (i.e.: anaphylactic or anaphylactoid reactions); endorphine suppression; bradycardia and/or hypotension; water retention and/or peripheral vascular relaxation leading to lower extremity edema and possible stasis ulcers; respiratory depression and/or shortness of breath; decreased metabolic rate leading to weight gain; swelling or edema; medication-induced neural toxicity; particulate matter embolism and blood vessel occlusion with resultant organ, and/or nervous system infarction; and/or intrathecal granuloma formation with possible spinal cord compression and permanent paralysis.  Before refilling the pump Mr. Dehner was informed that some of the medications used in the devise may not be FDA approved for such use and therefore it constitutes an off-label use of the medications.  Finally, he was informed that Medicine is not an exact science; therefore, there is also the possibility of unforeseen or unpredictable risks and/or possible complications that may result in a catastrophic outcome. The patient indicated having understood very clearly. We have given the patient no guarantees and we have made no promises. Enough time was given to the patient to ask questions, all of which were answered to the patient's satisfaction. Mr. Casto has indicated  that he wanted to continue with the procedure. Attestation: I, the ordering provider, attest that I have discussed with the patient the benefits, risks, side-effects, alternatives, likelihood of achieving goals, and potential problems during recovery for the procedure that I have provided informed consent. Date  Time: 03/29/2020 11:39 AM  Pre-Procedure Preparation:  Monitoring: As per clinic protocol. Respiration, ETCO2, SpO2, BP, heart rate and rhythm monitor placed and checked for adequate function Safety Precautions: Patient was assessed for positional comfort and pressure points before starting the procedure. Time-out: I initiated and conducted the "Time-out" before starting the procedure, as per protocol. The patient was asked to participate by confirming the accuracy of the "Time Out" information. Verification of the correct person, site, and procedure were performed and confirmed by me, the nursing staff, and the patient. "Time-out" conducted as per Joint Commission's Universal Protocol (UP.01.01.01). Time:    Description of Procedure:          Position: Supine Target Area: Central-port of intrathecal pump. Approach: Anterior, 90 degree angle approach. Area Prepped: Entire Area around the pump implant. DuraPrep (Iodine Povacrylex [0.7% available iodine] and Isopropyl Alcohol, 74% w/w) Safety Precautions: Aspiration looking for blood return was conducted prior to all injections. At no point did we inject any substances, as a needle was being advanced. No attempts were made at seeking any paresthesias. Safe injection practices and needle disposal techniques used. Medications properly checked for expiration dates. SDV (single dose vial) medications used. Description of the Procedure: Protocol guidelines were followed. Two nurses trained to do implant refills were present during the entire procedure. The refill medication was checked by both healthcare providers as well as the patient. The  patient was included in the "Time-out" to verify the medication. The patient was  placed in position. The pump was identified. The area was prepped in the usual manner. The sterile template was positioned over the pump, making sure the side-port location matched that of the pump. Both, the pump and the template were held for stability. The needle provided in the Medtronic Kit was then introduced thru the center of the template and into the central port. The pump content was aspirated and discarded volume documented. The new medication was slowly infused into the pump, thru the filter, making sure to avoid overpressure of the device. The needle was then removed and the area cleansed, making sure to leave some of the prepping solution back to take advantage of its long term bactericidal properties. The pump was interrogated and programmed to reflect the correct medication, volume, and dosage. The program was printed and taken to the physician for approval. Once checked and signed by the physician, a copy was provided to the patient and another scanned into the EMR. Vitals:   03/29/20 1137  BP: 111/73  Pulse: 89  Resp: 18  Temp: 98.3 F (36.8 C)  TempSrc: Oral  SpO2: 100%  Weight: 156 lb (70.8 kg)  Height: _0  (1.727 m)    Start Time:   hrs. End Time:   hrs. Materials & Medications: Medtronic Refill Kit Medication(s): Please see chart orders for details.  Imaging Guidance:          Type of Imaging Technique: None used Indication(s): N/A Exposure Time: No patient exposure Contrast: None used. Fluoroscopic Guidance: N/A Ultrasound Guidance: N/A Interpretation: N/A  Antibiotic Prophylaxis:   Anti-infectives (From admission, onward)   None     Indication(s): None identified  Post-operative Assessment:  Post-procedure Vital Signs:  Pulse/HCG Rate: 89  Temp: 98.3 F (36.8 C) Resp: 18 BP: 111/73 SpO2: 100 %  EBL: None  Complications: No immediate post-treatment complications  observed by team, or reported by patient.  Note: The patient tolerated the entire procedure well. A repeat set of vitals were taken after the procedure and the patient was kept under observation following institutional policy, for this type of procedure. Post-procedural neurological assessment was performed, showing return to baseline, prior to discharge. The patient was provided with post-procedure discharge instructions, including a section on how to identify potential problems. Should any problems arise concerning this procedure, the patient was given instructions to immediately contact us, at any time, without hesitation. In any case, we plan to contact the patient by telephone for a follow-up status report regarding this interventional procedure.  Comments:  No additional relevant information.  Plan of Care  Orders:  Orders Placed This Encounter  Procedures  . PUMP REPROGRAM    Follow programming protocol by having two(2) healthcare providers present during programming.    Scheduling Instructions:     Please perform the following adjustment: Increase basal rate by 28.6%. Leave PCA mode the same.    Order Specific Question:   Where will this procedure be performed?    Answer:   [facility]  . CELIAC PLEXUS BLOCK    Standing Status:   Future    Standing Expiration Date:   04/29/2020    Scheduling Instructions:     Side: Bilateral     Sedation: With Sedation.     TIMEFRAME: ASAA    Order Specific Question:   Where will this procedure be performed?    Answer:   [facility]  . Blood Thinner Instructions to Nursing    Always make sure patient has clearance  from prescribing physician to stop blood thinners for interventional therapies. If the patient requires a Lovenox-bridge therapy, make sure arrangements are made to institute it with the assistance of the PCP.    Scheduling Instructions:     Have Mr. Woodberry stop the Plavix (Clopidogrel) x 7-10 days prior to procedure or  surgery.   Chronic Opioid Analgesic:  Oxycodone IR 109m, 1 tab PO q 4 hrs (60 mg/day of oxycodone IR) (90 MME) + morphine ER 30 mg twice daily (60 mg/day of morphine ER) (60 MME) (150 MME/day). Highest recorded MME/day: 150 mg/day 90 day average MME/day: 77.89 mg/day   Medications ordered for procedure: No orders of the defined types were placed in this encounter.  Medications administered: Tyrae F. Bihl had no medications administered during this visit.  See the medical record for exact dosing, route, and time of administration.  Follow-up plan:   Return for Procedure (w/ sedation): (B) CPB, (Blood Thinner Protocol), (40-min).       Interventional treatment options: Planned, scheduled, and/or pending:    NOTE: PLAVIX ANTICOAGULATION  (Stop: 7 days  Restart: 2 hrs) Diagnostic bilateral celiac plexus block #3 with open possibility of alcohol neurolysis    Under consideration:   Diagnostic intrathecal injection of opioid analgesic (intrathecal pump trial).   Therapeutic/palliative (PRN):   Diagnostic bilateral celiac plexus block #3 (06/01/2019; 11/02/2019) (50/30/30/0) (75/75/0/0)    Recent Visits Date Type Provider Dept  03/27/20 Procedure visit NMilinda Pointer MD [facility]  03/21/20 Procedure visit NMilinda Pointer MD [facility]  03/13/20 Procedure visit NMilinda Pointer MD [facility]  03/09/20 Procedure visit NMilinda Pointer MD [facility]  03/06/20 Procedure visit NMilinda Pointer MD [facility]  03/02/20 Procedure visit NMilinda Pointer MD [facility]  Showing recent visits within past 90 days and meeting all other requirements Today's Visits Date Type Provider Dept  03/29/20 Procedure visit NMilinda Pointer MD [facility]  Showing today's visits and meeting all other requirements Future Appointments Date Type Provider Dept  04/06/20 Appointment NMilinda Pointer MD  [facility]  05/23/20 Appointment NMilinda Pointer MD [facility]  Showing future appointments within next 90 days and meeting all other requirements  Disposition: Discharge home  Discharge (Date  Time): 03/29/2020; 1300 hrs.   Primary Care Physician: HTracie Harrier MD [facility] Note by: FGaspar Cola MD Date: 03/29/2020; Time: 7:08 PM  Disclaimer:  Medicine is not an eChief Strategy Officer The only guarantee in medicine is that nothing is guaranteed. It is important to note that the decision to proceed with this intervention was based on the information collected from the patient. The Data and conclusions were drawn from the patient's questionnaire, the interview, and the physical examination. Because the information was provided in large part by the patient, it cannot be guaranteed that it has not been purposely or unconsciously manipulated. Every effort has been made to obtain as much relevant data as possible for this evaluation. It is important to note that the conclusions that lead to this procedure are derived in large part from the available data. Always take into account that the treatment will also be dependent on availability of resources and existing treatment guidelines, considered by other Pain Management Practitioners as being common knowledge and practice, at the time of the intervention. For Medico-Legal purposes, it is also important to point out that variation in procedural techniques and pharmacological choices are the acceptable norm. The indications, contraindications, technique, and  results of the above procedure should only be interpreted and judged by a Board-Certified Interventional Pain Specialist with extensive familiarity and expertise in the same exact procedure and technique.

## 2020-03-29 NOTE — Progress Notes (Signed)
Nursing Pain Medication Assessment:  Safety precautions to be maintained throughout the outpatient stay will include: orient to surroundings, keep bed in low position, maintain call bell within reach at all times, provide assistance with transfer out of bed and ambulation.  Medication Inspection Compliance: Pill count conducted under aseptic conditions, in front of the patient. Neither the pills nor the bottle was removed from the patient's sight at any time. Once count was completed pills were immediately returned to the patient in their original bottle.  Medication: Oxycodone IR Pill/Patch Count: 15 of 60 pills remain Pill/Patch Appearance: Markings consistent with prescribed medication Bottle Appearance: Standard pharmacy container. Clearly labeled. Filled Date: 03/22/2020 Last Medication intake:  Today

## 2020-04-06 ENCOUNTER — Ambulatory Visit (HOSPITAL_BASED_OUTPATIENT_CLINIC_OR_DEPARTMENT_OTHER): Payer: Medicare Other | Admitting: Pain Medicine

## 2020-04-06 ENCOUNTER — Ambulatory Visit
Admission: RE | Admit: 2020-04-06 | Discharge: 2020-04-06 | Disposition: A | Discharge status: Home / Self Care | Payer: Medicare Other | Source: Ambulatory Visit | Attending: Pain Medicine | Admitting: Pain Medicine

## 2020-04-06 ENCOUNTER — Other Ambulatory Visit: Payer: Self-pay

## 2020-04-06 ENCOUNTER — Encounter: Payer: Self-pay | Admitting: Pain Medicine

## 2020-04-06 VITALS — BP 90/48 | HR 72 | Temp 98.4°F | Resp 11 | Ht 68.0 in | Wt 156.0 lb

## 2020-04-06 DIAGNOSIS — C8590 Non-Hodgkin lymphoma, unspecified, unspecified site: Secondary | ICD-10-CM | POA: Diagnosis not present

## 2020-04-06 DIAGNOSIS — C8300 Small cell B-cell lymphoma, unspecified site: Secondary | ICD-10-CM

## 2020-04-06 DIAGNOSIS — R1084 Generalized abdominal pain: Secondary | ICD-10-CM

## 2020-04-06 DIAGNOSIS — G893 Neoplasm related pain (acute) (chronic): Secondary | ICD-10-CM | POA: Diagnosis not present

## 2020-04-06 DIAGNOSIS — G8929 Other chronic pain: Secondary | ICD-10-CM | POA: Insufficient documentation

## 2020-04-06 DIAGNOSIS — Z978 Presence of other specified devices: Secondary | ICD-10-CM | POA: Insufficient documentation

## 2020-04-06 DIAGNOSIS — Z7901 Long term (current) use of anticoagulants: Secondary | ICD-10-CM

## 2020-04-06 DIAGNOSIS — R109 Unspecified abdominal pain: Secondary | ICD-10-CM | POA: Diagnosis not present

## 2020-04-06 DIAGNOSIS — R1013 Epigastric pain: Secondary | ICD-10-CM | POA: Diagnosis not present

## 2020-04-06 DIAGNOSIS — C9110 Chronic lymphocytic leukemia of B-cell type not having achieved remission: Secondary | ICD-10-CM | POA: Insufficient documentation

## 2020-04-06 DIAGNOSIS — Z79899 Other long term (current) drug therapy: Secondary | ICD-10-CM | POA: Insufficient documentation

## 2020-04-06 DIAGNOSIS — Z7902 Long term (current) use of antithrombotics/antiplatelets: Secondary | ICD-10-CM | POA: Insufficient documentation

## 2020-04-06 MED ORDER — ROPIVACAINE HCL 2 MG/ML IJ SOLN
4.0000 mL | Freq: Once | INTRAMUSCULAR | Status: AC
  Administered 2020-04-06: 4 mL

## 2020-04-06 MED ORDER — TRIAMCINOLONE ACETONIDE 40 MG/ML IJ SUSP
40.0000 mg | Freq: Once | INTRAMUSCULAR | Status: AC
  Administered 2020-04-06: 40 mg

## 2020-04-06 MED ORDER — IOHEXOL 180 MG/ML  SOLN
10.0000 mL | Freq: Once | INTRAMUSCULAR | Status: AC
  Administered 2020-04-06: 10 mL via INTRATHECAL

## 2020-04-06 MED ORDER — ROPIVACAINE HCL 2 MG/ML IJ SOLN
INTRAMUSCULAR | Status: AC
  Filled 2020-04-06: qty 10

## 2020-04-06 MED ORDER — BUPIVACAINE-EPINEPHRINE (PF) 0.25% -1:200000 IJ SOLN
14.0000 mL | Freq: Once | INTRAMUSCULAR | Status: AC
  Administered 2020-04-06: 14 mL
  Filled 2020-04-06: qty 30

## 2020-04-06 MED ORDER — MIDAZOLAM HCL 5 MG/5ML IJ SOLN
1.0000 mg | INTRAMUSCULAR | Status: DC | PRN
  Administered 2020-04-06: 2 mg via INTRAVENOUS
  Filled 2020-04-06: qty 5

## 2020-04-06 MED ORDER — FENTANYL CITRATE (PF) 100 MCG/2ML IJ SOLN
25.0000 ug | INTRAMUSCULAR | Status: DC | PRN
  Administered 2020-04-06: 50 ug via INTRAVENOUS
  Filled 2020-04-06: qty 2

## 2020-04-06 MED ORDER — LACTATED RINGERS IV SOLN
1000.0000 mL | Freq: Once | INTRAVENOUS | Status: AC
  Administered 2020-04-06: 1000 mL via INTRAVENOUS

## 2020-04-06 MED ORDER — TRIAMCINOLONE ACETONIDE 40 MG/ML IJ SUSP
INTRAMUSCULAR | Status: AC
  Filled 2020-04-06: qty 1

## 2020-04-06 MED ORDER — DEXAMETHASONE SODIUM PHOSPHATE 10 MG/ML IJ SOLN
10.0000 mg | Freq: Once | INTRAMUSCULAR | Status: AC
  Administered 2020-04-06: 10 mg
  Filled 2020-04-06: qty 1

## 2020-04-06 MED ORDER — LIDOCAINE HCL 2 % IJ SOLN
20.0000 mL | Freq: Once | INTRAMUSCULAR | Status: AC
  Administered 2020-04-06: 400 mg

## 2020-04-06 MED ORDER — ALCOHOL 98 % IJ SOLN
10.0000 mL | Freq: Once | INTRAMUSCULAR | Status: AC
  Administered 2020-04-06: 10 mL
  Filled 2020-04-06: qty 10

## 2020-04-06 NOTE — Patient Instructions (Addendum)
____________________________________________________________________________________________  Post-Procedure Discharge Instructions  Instructions:  Apply ice:   Purpose: This will minimize any swelling and discomfort after procedure.   When: Day of procedure, as soon as you get home.  How: Fill a plastic sandwich bag with crushed ice. Cover it with a small towel and apply to injection site.  How long: (15 min on, 15 min off) Apply for 15 minutes then remove x 15 minutes.  Repeat sequence on day of procedure, until you go to bed.  Apply heat:   Purpose: To treat any soreness and discomfort from the procedure.  When: Starting the next day after the procedure.  How: Apply heat to procedure site starting the day following the procedure.  How long: May continue to repeat daily, until discomfort goes away.  Food intake: Start with clear liquids (like water) and advance to regular food, as tolerated.   Physical activities: Keep activities to a minimum for the first 8 hours after the procedure. After that, then as tolerated.  Driving: If you have received any sedation, be responsible and do not drive. You are not allowed to drive for 24 hours after having sedation.  Blood thinner: (Applies only to those taking blood thinners) You may restart your blood thinner 6 hours after your procedure.  Insulin: (Applies only to Diabetic patients taking insulin) As soon as you can eat, you may resume your normal dosing schedule.  Infection prevention: Keep procedure site clean and dry. Shower daily and clean area with soap and water.  Post-procedure Pain Diary: Extremely important that this be done correctly and accurately. Recorded information will be used to determine the next step in treatment. For the purpose of accuracy, follow these rules:  Evaluate only the area treated. Do not report or include pain from an untreated area. For the purpose of this evaluation, ignore all other areas of pain,  except for the treated area.  After your procedure, avoid taking a long nap and attempting to complete the pain diary after you wake up. Instead, set your alarm clock to go off every hour, on the hour, for the initial 8 hours after the procedure. Document the duration of the numbing medicine, and the relief you are getting from it.  Do not go to sleep and attempt to complete it later. It will not be accurate. If you received sedation, it is likely that you were given a medication that may cause amnesia. Because of this, completing the diary at a later time may cause the information to be inaccurate. This information is needed to plan your care.  Follow-up appointment: Keep your post-procedure follow-up evaluation appointment after the procedure (usually 2 weeks for most procedures, 6 weeks for radiofrequencies). DO NOT FORGET to bring you pain diary with you.   Expect: (What should I expect to see with my procedure?)  From numbing medicine (AKA: Local Anesthetics): Numbness or decrease in pain. You may also experience some weakness, which if present, could last for the duration of the local anesthetic.  Onset: Full effect within 15 minutes of injected.  Duration: It will depend on the type of local anesthetic used. On the average, 1 to 8 hours.   From steroids (Applies only if steroids were used): Decrease in swelling or inflammation. Once inflammation is improved, relief of the pain will follow.  Onset of benefits: Depends on the amount of swelling present. The more swelling, the longer it will take for the benefits to be seen. In some cases, up to 10 days.    Duration: Steroids will stay in the system x 2 weeks. Duration of benefits will depend on multiple posibilities including persistent irritating factors.  Side-effects: If present, they may typically last 2 weeks (the duration of the steroids).  Frequent: Cramps (if they occur, drink Gatorade and take over-the-counter Magnesium 450-500 mg  once to twice a day); water retention with temporary weight gain; increases in blood sugar; decreased immune system response; increased appetite.  Occasional: Facial flushing (red, warm cheeks); mood swings; menstrual changes.  Uncommon: Long-term decrease or suppression of natural hormones; bone thinning. (These are more common with higher doses or more frequent use. This is why we prefer that our patients avoid having any injection therapies in other practices.)   Very Rare: Severe mood changes; psychosis; aseptic necrosis.  From procedure: Some discomfort is to be expected once the numbing medicine wears off. This should be minimal if ice and heat are applied as instructed.  Call if: (When should I call?)  You experience numbness and weakness that gets worse with time, as opposed to wearing off.  New onset bowel or bladder incontinence. (Applies only to procedures done in the spine)  Emergency Numbers:  Durning business hours (Monday - Thursday, 8:00 AM - 4:00 PM) (Friday, 9:00 AM - 12:00 Noon): [phone]  After hours: [phone]  NOTE: If you are having a problem and are unable connect with, or to talk to a provider, then go to your nearest urgent care or emergency department. If the problem is serious and urgent, please call 911. ____________________________________________________________________________________________   1.  Patient may experience some diarrhea over the next 5 days.  May take any anti diarrhea medicine over the counter.  Be sure to hydrate with something like Gatorade to help not become dehydrated.

## 2020-04-06 NOTE — Progress Notes (Signed)
PROVIDER NOTE: Information contained herein reflects review and annotations entered in association with encounter. Interpretation of such information and data should be left to medically-trained personnel. Information provided to patient can be located elsewhere in the medical record under "Patient Instructions". Document created using STT-dictation technology, any transcriptional errors that may result from process are unintentional.    Patient: James [name]  Service Category: Procedure  Provider: Gaspar Cola, MD  DOB: 05-22-1950  DOS: 04/06/2020  Location: [facility]  MRN: [id number]  Setting: Ambulatory - outpatient  Referring Provider: [name], MD  Type: Established Patient  Specialty: Interventional Pain Management  PCP: [name], MD   Primary Reason for Visit: Interventional Pain Management Treatment. CC: Abdominal Pain (mid)  Procedure #1:  Anesthesia, Analgesia, Anxiolysis:  Type: Abdominis rectus Trigger Point Injection (1-2 muscle groups) #1  CPT: 20552 Primary Purpose: Diagnostic Region: Superior Abdominal area (epigastric) Level: Epigastric region Target Area: Rectus abdominis muscle Trigger Point Approach: Percutaneous, ipsilateral approach. Laterality: Bilateral        Type: Local Anesthesia Indication(s): Analgesia         Local Anesthetic: Lidocaine 1-2% Route: Infiltration ([place]/IM) IV Access: Declined Sedation: Declined   Position: Supine   Indications: 1. Abdominal wall pain in epigastric region   2. Chronic abdominal pain    Procedure #2:  Intrathecal Drug Delivery System (IDDS):  Type: Analysis & Programming [phone])       Region: Abdominal Laterality: Left  Type of Pump: Medtronic Synchromed II Delivery Route: Intrathecal Type of Pain Treated: Neuropathic/Nociceptive Primary Medication Class: Opioid/opiate  Medication, Concentration, Infusion Program, & Delivery Rate: Please see scanned programming printout.    Indications: 1. Chronic abdominal pain   2. Cancer-related pain   3. CLL (chronic lymphocytic leukemia) ([place])   4. Chronic epigastric pain (1ry area of Pain)   5. Malignant lymphoma, small lymphocytic ([place])   6. Generalized abdominal pain   7. Chronic anticoagulation (Plavix)   8. Presence of intrathecal pump (Medtronic)    Procedure #3:  Anesthesia, Analgesia, Anxiolysis:  Type: Neurolytic Celiac Plexus Block #1 (using absolute alcohol) Region:Thoracolumbar Level: anterolateral aspect of T12-L1 (target); above the lateral tip of the L2 transverse process (skin puncture) Laterality: Bilateral Paramedial  Type: Moderate (Conscious) Sedation combined with Local Anesthesia Indication(s): Analgesia and Anxiety Route: Intravenous (IV) IV Access: Secured Sedation: Meaningful verbal contact was maintained at all times during the procedure  Local Anesthetic: Lidocaine 1-2%  Position: Prone with head of the table was raised to facilitate breathing.   Indications: 1. Chronic abdominal pain   2. Cancer-related pain   3. CLL (chronic lymphocytic leukemia) ([place])   4. Chronic epigastric pain (1ry area of Pain)   5. Malignant lymphoma, small lymphocytic ([place])   6. Generalized abdominal pain   7. Chronic anticoagulation (Plavix)   8. Presence of intrathecal pump (Medtronic)    Pain Score: Pre-procedure: 6 /10 Post-procedure: (P) 0-No pain (see previous note.)/10   Discharge summary: The purpose of this particular visit is to determine the etiology of the patient's pain and see if we can get it under control so as to get the intrathecal pump working better.  The first thing that was done today was to conduct a physical exam of the anterior wall where the patient has evidence of a prior scar under his right rib cage.  The pain seems to start close to the medial aspect of that scar over the area of the rectus abdominis muscle.  The first thing that  I did was to have the patient point exactly  the area where he is having the pain.  I did proceeded to conduct a Carnett's test, which seem to be positive on both the compression and the release of the pressure.  For this reason decision was made to do a diagnostic trigger point injection into the area of the rectus abdominis muscle.  This trigger point injection was the first thing that we did and I injected a total of 5 mL of a solution containing 40 mg of triamcinolone (1 cc) + 9 mL of ropivacaine.  We then switched to analyzing his intrathecal pump so as to provide enough time for that local anesthetic to work.  We also conducted a pill count.  The analysis revealed that the patient had used the PCA portion of the intrathecal pump an average of 5-6 times out of 6 allowed attempts.  This means that he was maxing out on the dose of medicine that he was getting from the device and he was still complaining that it did not seem to control the pain.  In addition, the pill count revealed that he has used 26 of the 60 pills given to him.  What this means is that not only has he maxed out the basal rate a.m. the PCA mode on the intrathecal pump, but he has also required additional oral medication to control this pain.  Looking at the maximum amount of medicine that the pump was set to deliver, we have that he probably used 1081.6 mcg/day of intrathecal fentanyl with a total accumulated dose of bupivacaine reaching 10.816 mg/day.  In view of this, I reprogrammed the intrathecal pump to the liver an additional 20% of the basal rate, which brought up the basal rate from 900 mcg/day to 1046.7 mcg/day this is just below the 1081.6 mcg that he was getting with the some of the old basal rate and the PCA mode.  Now, with the use of the PCA mode the patient could bring up the total amount of fentanyl used to 1227.1 g/day with a maximum allowable bupivacaine rate of 12.271 mg/day.  After all of these calculations were made and the pump was reprogrammed, then we turned to  look at the results of the trigger point injection.  The patient indicated that he had really not decreased his abdominal pain.  Having ruled out abdominal wall pain, we then turned our attention to the visceral pain.  At this point we went ahead and started an IV to get the patient ready for his diagnostic bilateral celiac plexus block and possible neurolysis.  The procedure was performed as indicated below.  Once we had the needles in place, over the anterior aspect of the T12 vertebral body, we then proceeded with the injection of contrast to make sure that we did not have any of the medicine going intrathecally, or intravascular.  In the case of the left side, there was no problems with the placement of the needle, but in the case of the right side the patient has a rather large disc osteophyte complex on the right side that made placement of the needle difficult.  In addition, the initial placement of the needle and injection of contrast yielded evidence that the contrast was intravascular.  The needle was moved until we were able to have adequate distribution of the contrast without any vascular uptake.  At this point, I then proceeded to inject the preservative-free 0.25% bupivacaine with epinephrine 1 in 200,000.  I injected 5 mL of this solution on each side and then proceeded to wait.  After approximately 15 minutes, we reassessed the patient and he indicated that his abdominal pain had gone down significantly.  We also conducted a brief neurological exam and once we were comfortable with the fact that he was not having any neurological problems and his pain was much better, then we went ahead and called for the dehydrated alcohol to be brought in.  After approximately 20 minutes of having injected the local anesthetic and assessed the response, we then proceeded to inject 5 mL of absolute dehydrated alcohol on each side.  During the injection the patient was constantly assessed for neurological symptoms  and or pain.  He was able to tolerate the procedure well without any type of discomfort or paresthesias.  Prior to discharge, the patient was again assessed and found to have attained 100% relief of his right sided abdominal pain and approximately 90% relief of the left abdominal pain.  Neurological exam was within normal limits.  The patient was warned about the possibility of diarrhea and loose stools secondary to a neurolytic celiac plexus block.  He was instructed to stay on top of it with fluids and Gatorade if necessary.  After the neurolytic celiac plexus block we also observed an expected drop in blood pressure as a consequence of vessel relaxation in his bowels with pooling of blood and subsequent drop in his baseline blood pressure.  He was provided with IV fluids to compensate and stabilize this.  He was also instructed to be careful with orthostatic hypotension and to make sure that he stands up slowly so as to avoid getting dizzy.  Pre-op H&P Assessment:  Mr. Shough is a 69 y.o. (year old), male patient, seen today for interventional treatment. He  has a past surgical history that includes Cholecystectomy (1983); Esophagogastroduodenoscopy (egd) with propofol (N/A, 11/20/2016); Cataract extraction w/ intraocular lens implant (Bilateral); Lower Extremity Angiography (Right, 05/19/2017); Esophagogastroduodenoscopy (egd) with propofol (N/A, 10/27/2017); Colonoscopy with propofol (N/A, 10/27/2017); Lower Extremity Angiography (Left, 08/10/2018); and Intrathecal pump implant (Left, 02/07/2020). Mr. Lepore has a current medication list which includes the following prescription(s): atorvastatin, clopidogrel, ensure, esomeprazole, folic acid, metformin, methocarbamol, naloxegol oxalate, naloxone, oxycodone hcl, [START ON 05/18/2020] PAIN MANAGEMENT IT PUMP REFILL, pantoprazole, and sucralfate, and the following Facility-Administered Medications: fentanyl and midazolam. His primarily concern today is the Abdominal  Pain (mid)  Initial Vital Signs:  Pulse/HCG Rate: 72ECG Heart Rate: 64 Temp: (!) 97.3 F (36.3 C) Resp: 16 BP: 127/65 SpO2: 100 %  BMI: Estimated body mass index is 23.72 kg/m as calculated from the following:   Height as of this encounter: 5' 8"  (1.727 m).   Weight as of this encounter: 156 lb (70.8 kg).  Risk Assessment: Allergies: Reviewed. He has No Known Allergies.  Allergy Precautions: None required Coagulopathies: Reviewed. None identified.  Blood-thinner therapy: None at this time Active Infection(s): Reviewed. None identified. Mr. Laubacher is afebrile  Site Confirmation: Mr. Gut was asked to confirm the procedure and laterality before marking the site Procedure checklist: Completed Consent: Before the procedure and under the influence of no sedative(s), amnesic(s), or anxiolytics, the patient was informed of the treatment options, risks and possible complications. To fulfill our ethical and legal obligations, as recommended by the American Medical Association's Code of Ethics, I have informed the patient of my clinical impression; the nature and purpose of the treatment or procedure; the risks, benefits, and possible complications of the intervention; the  alternatives, including doing nothing; the risk(s) and benefit(s) of the alternative treatment(s) or procedure(s); and the risk(s) and benefit(s) of doing nothing. The patient was provided information about the general risks and possible complications associated with the procedure. These may include, but are not limited to: failure to achieve desired goals, infection, bleeding, organ or nerve damage, allergic reactions, paralysis, and death. In addition, the patient was informed of those risks and complications associated to Spine-related procedures, such as failure to decrease pain; infection (i.e.: Meningitis, epidural or intraspinal abscess); bleeding (i.e.: epidural hematoma, subarachnoid hemorrhage, or any other type of  intraspinal or peri-dural bleeding); organ or nerve damage (i.e.: Any type of peripheral nerve, nerve root, or spinal cord injury) with subsequent damage to sensory, motor, and/or autonomic systems, resulting in permanent pain, numbness, and/or weakness of one or several areas of the body; allergic reactions; (i.e.: anaphylactic reaction); and/or death. Furthermore, the patient was informed of those risks and complications associated with the medications. These include, but are not limited to: allergic reactions (i.e.: anaphylactic or anaphylactoid reaction(s)); adrenal axis suppression; blood sugar elevation that in diabetics may result in ketoacidosis or comma; water retention that in patients with history of congestive heart failure may result in shortness of breath, pulmonary edema, and decompensation with resultant heart failure; weight gain; swelling or edema; medication-induced neural toxicity; particulate matter embolism and blood vessel occlusion with resultant organ, and/or nervous system infarction; and/or aseptic necrosis of one or more joints. Finally, the patient was informed that Medicine is not an exact science; therefore, there is also the possibility of unforeseen or unpredictable risks and/or possible complications that may result in a catastrophic outcome. The patient indicated having understood very clearly. We have given the patient no guarantees and we have made no promises. Enough time was given to the patient to ask questions, all of which were answered to the patient's satisfaction. Mr. Walker has indicated that he wanted to continue with the procedure. Attestation: I, the ordering provider, attest that I have discussed with the patient the benefits, risks, side-effects, alternatives, likelihood of achieving goals, and potential problems during recovery for the procedure that I have provided informed consent. Date  Time: 04/06/2020  9:24 AM  Pre-Procedure Preparation:  Monitoring: As  per clinic protocol. Respiration, ETCO2, SpO2, BP, heart rate and rhythm monitor placed and checked for adequate function Safety Precautions: Patient was assessed for positional comfort and pressure points before starting the procedure. Time-out: I initiated and conducted the "Time-out" before starting the procedure, as per protocol. The patient was asked to participate by confirming the accuracy of the "Time Out" information. Verification of the correct person, site, and procedure were performed and confirmed by me, the nursing staff, and the patient. "Time-out" conducted as per Joint Commission's Universal Protocol (UP.01.01.01). Time: 1118  Description of Procedure #1:  Area Prepped: Entire Superior Abdominal Region DuraPrep (Iodine Povacrylex [0.7% available iodine] and Isopropyl Alcohol, 74% w/w) Safety Precautions: Aspiration looking for blood return was conducted prior to all injections. At no point did we inject any substances, as a needle was being advanced. No attempts were made at seeking any paresthesias. Safe injection practices and needle disposal techniques used. Medications properly checked for expiration dates. SDV (single dose vial) medications used. Description of the Procedure: Protocol guidelines were followed. The patient was placed in position over the fluoroscopy table. The target area was identified and the area prepped in the usual manner. Skin & deeper tissues infiltrated with local anesthetic. Appropriate amount of time allowed  to pass for local anesthetics to take effect. The procedure needles were then advanced to the target area. Proper needle placement secured. Negative aspiration confirmed. Solution injected in intermittent fashion, asking for systemic symptoms every 0.5cc of injectate. The needles were then removed and the area cleansed, making sure to leave some of the prepping solution back to take advantage of its long term bactericidal properties.  Vitals:   04/06/20  0920  BP: 127/65  Pulse: 72  Resp: 16  Temp: (!) 97.3 F (36.3 C)  SpO2: 100%  Weight: 156 lb (70.8 kg)  Height: $Remove'5\' 8"'MPaPHGK$  (1.727 m)    Start Time: 0948 hrs. End Time: 0950 hrs. Materials:  Needle(s) Type: Regular needle Gauge: 25G Length: 1.5-in Medication(s): Please see orders for medications and dosing details.  Imaging Guidance for procedure #1:  Type of Imaging Technique: None used Indication(s): N/A Exposure Time: No patient exposure Contrast: None used. Fluoroscopic Guidance: N/A Ultrasound Guidance: N/A Interpretation: N/A  Description of Procedure #2:  Position: Supine Target Area: intrathecal pump. Approach: Anterior approach. Safety Precautions: At least two trained healthcare practitioners present to verify programming and calculations. Description of the Procedure: Protocol guidelines were followed.      The pump was interrogated and programmed to reflect the correct medication, volume, and dosage. The program was printed and taken to the physician for approval. Once checked and signed by the physician, a copy was provided to the patient and another scanned into the EMR. Vitals:   04/06/20 0920  BP: 127/65  Pulse: 72  Resp: 16  Temp: (!) 97.3 F (36.3 C)  SpO2: 100%  Weight: 156 lb (70.8 kg)  Height: $Remove'5\' 8"'jxMUWCS$  (1.727 m)    Start Time: 0948 hrs. End Time: 0950 hrs. Materials & Medications: Medtronic Programmer Medication(s): Please see chart orders for details.  Imaging Guidance for procedure #2:  Type of Imaging Technique: None used Indication(s): N/A Exposure Time: No patient exposure Contrast: None used. Fluoroscopic Guidance: N/A Ultrasound Guidance: N/A Interpretation: N/A  Pharmacotherapy Assessment  Analgesic: Oxycodone IR $RemoveBefo'10mg'JalooeMsryF$ , 1 tab PO q 4 hrs (60 mg/day of oxycodone IR) (90 MME) + morphine ER 30 mg twice daily (60 mg/day of morphine ER) (60 MME) (150 MME/day). Highest recorded MME/day: 150 mg/day 90 day average MME/day: 77.89 mg/day    Monitoring: [place] PMP: PDMP reviewed during this encounter.       Pharmacotherapy: No side-effects or adverse reactions reported. Compliance: No problems identified. Effectiveness: Clinically acceptable. Plan: Refer to "POC".  UDS: No results found for: SUMMARY  Intrathecal Pump Therapy Assessment  Manufacturer: Medtronic Synchromed Type: Programmable Volume: 40 mL reservoir MRI compatibility: Yes   Drug content:  Primary Medication Class:Opioid Primary Medication:PF-Fentanyl(2000 mcg/mL) Secondary Medication:PF-Bupivacaine(20 mg/mL) Other Medication:None  PTM(PCA) mode: PTM dose:25 mcg bolus Duration of bolus:1 minute Lockout interval:4 hours Maximum 24-hour doses:6   Programming:  Type: Simple continuous. See pump readout for details.   Changes:  Medication Change: None at this point Rate Change: Today we have reviewed the programming on his intrathecal pump and he has been using his PCA mode to the maximum meaning that he had a total of1,081.53mcg/day, and he is still complaining of insufficient analgesia. For this reason, today we have increased his basal rate to 1,046.78mcg/day.PCA mode will be kept the same with allowed activations of 6/day(1 every 4 hours). He should be able to get amaximum of 1227.1 mcg/day of fentanyl and 12.271 mg/day of bupivacaine. The concentration and dose of fentanyl and bupivacaine that he can get per dose  will remain the same as before.  Reported side-effects or adverse reactions: None reported  Effectiveness: Described as relatively effective, allowing for increase in activities of daily living (ADL) Clinically meaningful improvement in function (CMIF): Sustained CMIF goals met  Plan: Analysis and programming  Description of Procedure #3:  Target Area: For the Celiac Plexus Block(s), the target is the area anterolateral to the Aorta at the level of T12-L1, with the splanchnic nerves crossing the diaphragmatic crux at  about the T12 level. Approach: Paravertebral, percutaneous approach. Area Prepped: Entire Posterior Thoracolumbar Region DuraPrep (Iodine Povacrylex [0.7% available iodine] and Isopropyl Alcohol, 74% w/w) Safety Precautions: Aspiration looking for blood return was conducted prior to all injections. At no point did we inject any substances, as a needle was being advanced. No attempts were made at seeking any paresthesias. Safe injection practices and needle disposal techniques used. Medications properly checked for expiration dates. SDV (single dose vial) medications used. Description of the Procedure: Protocol guidelines were followed. The patient was placed in position over the procedure table. The target area was identified and the area prepped in the usual manner. Skin & deeper tissues infiltrated with local anesthetic. Appropriate amount of time allowed to pass for local anesthetics to take effect. The procedure needles were introduced at the skin level of the L2 transverse process and advanced cephalad below the L1 transverse process, aiming towards the anterolateral aspect of the T12-L1 area, under pulsed fluoroscopic guidance. Care was taken not to advance the tip of the needle past the anterior border of the vertebral body, on the lateral fluoroscopic view. Proper needle placement secured. Negative aspiration confirmed. Solution injected in intermittent fashion, asking for systemic symptoms every 0.5cc of injectate.  Once injected, we left the needles in place and waited for approximately 20 minutes to reassess the effectiveness of the block.  Once we were happy with the results then we proceeded to inject 5 mL of absolute alcohol on each side.  After injecting the absolute alcohol and confirming that the patient had no problems with it, then the needle was pulled back several centimeters and less than 1 cc of local anesthetic was used to clear the needle so as to avoid tracking. The needles were then  removed and the area cleansed, making sure to leave some of the prepping solution back to take advantage of its long term bactericidal properties.  Vitals:   04/06/20 1154 04/06/20 1203 04/06/20 1213 04/06/20 1224  BP: 110/64 (!) 109/51 (!) 90/48 (!) (P) 123/56  Pulse:      Resp: 16 12 11  (!) (P) 9  Temp: 98.4 F (36.9 C)   (P) 97.8 F (36.6 C)  TempSrc: Temporal   (P) Temporal  SpO2: 100% 100% 99% (P) 99%  Weight:      Height:        Start Time: 1118 hrs. End Time: 1143 hrs. Materials:  Needle(s) Type: Spinal Needle Gauge: 22G Length: 5-in Medication(s): Please see orders for medications and dosing details.  Imaging Guidance (Spinal) for procedure #3:  Type of Imaging Technique: Fluoroscopy Guidance (Spinal) Indication(s): Assistance in needle guidance and placement for procedures requiring needle placement in or near specific anatomical locations not easily accessible without such assistance. Exposure Time: Please see nurses notes. Contrast: Before injecting any contrast, we confirmed that the patient did not have an allergy to iodine, shellfish, or radiological contrast. Once satisfactory needle placement was completed at the desired level, radiological contrast was injected. Contrast injected under live fluoroscopy. No contrast complications.  See chart for type and volume of contrast used. Fluoroscopic Guidance: I was personally present during the use of fluoroscopy. "Tunnel Vision Technique" used to obtain the best possible view of the target area. Parallax error corrected before commencing the procedure. "Direction-depth-direction" technique used to introduce the needle under continuous pulsed fluoroscopy. Once target was reached, antero-posterior, oblique, and lateral fluoroscopic projection used confirm needle placement in all planes. Images permanently stored in EMR. Interpretation: I personally interpreted the imaging intraoperatively. Adequate needle placement confirmed in  multiple planes. Appropriate spread of contrast into desired area was observed. No evidence of afferent or efferent intravascular uptake. No intrathecal or subarachnoid spread observed. Permanent images saved into the patient's record.  Antibiotic Prophylaxis:   Anti-infectives (From admission, onward)   None     Indication(s): None identified  Post-operative Assessment:  Post-procedure Vital Signs:  Pulse/HCG Rate: 72(!) (P) 56 Temp: (P) 97.8 F (36.6 C) Resp: (!) (P) 9 BP: (!) (P) 123/56 SpO2: (P) 99 %  EBL: None  Complications: No immediate post-treatment complications observed by team, or reported by patient.  Note: The patient tolerated the entire procedure well. A repeat set of vitals were taken after the procedure and the patient was kept under observation following institutional policy, for this type of procedure. Post-procedural neurological assessment was performed, showing return to baseline, prior to discharge. The patient was provided with post-procedure discharge instructions, including a section on how to identify potential problems. Should any problems arise concerning this procedure, the patient was given instructions to immediately contact us, at any time, without hesitation. In any case, we plan to contact the patient by telephone for a follow-up status report regarding this interventional procedure.  Comments:  No additional relevant information.  Plan of Care  Orders:  Orders Placed This Encounter  Procedures  . CELIAC PLEXUS BLOCK    CLINICAL INDICATIONS: Chronic Abdominal Pain    Scheduling Instructions:     PURPOSE: Therapeutic     LATERALITY: Bilateral     LEVEL(s): L1     SEDATION: Sedation recommended.     TIMEFRAME: Today    Order Specific Question:   Where will this procedure be performed?    Answer:   [facility]  . TRIGGER POINT INJECTION    Scheduling Instructions:     Area: Abdominal wall (Rectus abdominis)     Side: Bilateral      Sedation: No Sedation.     Timeframe: Today    Order Specific Question:   Where will this procedure be performed?    Answer:   [facility]  . DG PA[facility] C-ARM 1-60 MIN NO REPORT    Intraoperative interpretation by procedural physician at [place].    Standing Status:   Standing    Number of Occurrences:   1    Order Specific Question:   Reason for exam:    Answer:   Assistance in needle guidance and placement for procedures requiring needle placement in or near specific anatomical locations not easily accessible without such assistance.  . Informed Consent Details: Physician/Practitioner Attestation; Transcribe to consent form and obtain patient signature    Note: Always confirm laterality of pain with Mr. Lebeau, before procedure.    Order Specific Question:   Physician/Practitioner attestation of informed consent for procedure/surgical case    Answer:   I, the physician/practitioner, attest that I have discussed with the patient the benefits, risks, side effects, alternatives, likelihood of achieving goals and potential problems during recovery for  the procedure that I have provided informed consent.    Order Specific Question:   Procedure    Answer:   Celiac plexus block    Order Specific Question:   Physician/Practitioner performing the procedure    Answer:   Felis Quillin A. Dossie Arbour, MD    Order Specific Question:   Indication/Reason    Answer:   Chronic abdominal pain (visceral pain)  . Care order/instruction: Please confirm that the patient has stopped the Plavix (Clopidogrel) x 7-10 days prior to procedure or surgery.    Please confirm that the patient has stopped the Plavix (Clopidogrel) x 7-10 days prior to procedure or surgery.    Standing Status:   Standing    Number of Occurrences:   1  . Provide equipment / supplies at bedside    "Block Tray" (Disposable  single use) Needle type: SpinalSpinal Amount/quantity: 2 Size: Medium (5-inch) Gauge: 22G     Standing Status:   Standing    Number of Occurrences:   1    Order Specific Question:   Specify    Answer:   Block Tray  . Informed Consent Details: Physician/Practitioner Attestation; Transcribe to consent form and obtain patient signature    Scheduling Instructions:     Note: Always confirm laterality of pain with Mr. Bramer, before procedure.     Transcribe to consent form and obtain patient signature.    Order Specific Question:   Physician/Practitioner attestation of informed consent for procedure/surgical case    Answer:   I, the physician/practitioner, attest that I have discussed with the patient the benefits, risks, side effects, alternatives, likelihood of achieving goals and potential problems during recovery for the procedure that I have provided informed consent.    Order Specific Question:   Procedure    Answer:   Myoneural Block (Trigger Point injection)    Order Specific Question:   Physician/Practitioner performing the procedure    Answer:   Jailynne Opperman A. Dossie Arbour MD    Order Specific Question:   Indication/Reason    Answer:   Musculoskeletal pain/myofascial pain secondary to trigger point  . Provide equipment / supplies at bedside    "Block Tray" (Disposable  single use) Needle type: SpinalRegular Amount/quantity: 1 Size: 0.5-inch Gauge: 25G    Standing Status:   Standing    Number of Occurrences:   1    Order Specific Question:   Specify    Answer:   Block Tray  . Bleeding precautions    Standing Status:   Standing    Number of Occurrences:   1   Chronic Opioid Analgesic:  Oxycodone IR 48m, 1 tab PO q 4 hrs (60 mg/day of oxycodone IR) (90 MME) + morphine ER 30 mg twice daily (60 mg/day of morphine ER) (60 MME) (150 MME/day). Highest recorded MME/day: 150 mg/day 90 day average MME/day: 77.89 mg/day   Medications ordered for procedure: Meds ordered this encounter  Medications  . iohexol (OMNIPAQUE) 180 MG/ML injection 10 mL    Must be Myelogram-compatible. If not  available, you may substitute with a water-soluble, non-ionic, hypoallergenic, myelogram-compatible radiological contrast medium.  .Marland Kitchenlidocaine (XYLOCAINE) 2 % (with pres) injection 400 mg  . lactated ringers infusion 1,000 mL  . midazolam (VERSED) 5 MG/5ML injection 1-2 mg    Make sure Flumazenil is available in the pyxis when using this medication. If oversedation occurs, administer 0.2 mg IV over 15 sec. If after 45 sec no response, administer 0.2 mg again over 1 min; may repeat at 1  min intervals; not to exceed 4 doses (1 mg)  . fentaNYL (SUBLIMAZE) injection 25-50 mcg    Make sure Narcan is available in the pyxis when using this medication. In the event of respiratory depression (RR< 8/min): Titrate NARCAN (naloxone) in increments of 0.1 to 0.2 mg IV at 2-3 minute intervals, until desired degree of reversal.  . dexamethasone (DECADRON) injection 10 mg  . bupivacaine-epinephrine (MARCAINE W/ EPI) 0.25% -1:200000 injection 14 mL  . ropivacaine (PF) 2 mg/mL (0.2%) (NAROPIN) injection 4 mL  . triamcinolone acetonide (KENALOG-40) injection 40 mg  . Alcohol (DEHYDRATED ALCOHOL 98%) 98 % injection SOLN 10 mL   Medications administered: We administered iohexol, lidocaine, lactated ringers, midazolam, fentaNYL, dexamethasone, bupivacaine-epinephrine, ropivacaine (PF) 2 mg/mL (0.2%), triamcinolone acetonide, and Alcohol.  See the medical record for exact dosing, route, and time of administration.  Follow-up plan:   Return in about 5 days (around 04/11/2020) for (F2F), (PP) Follow-up.       Interventional treatment options: Planned, scheduled, and/or pending:    NOTE: PLAVIX ANTICOAGULATION  (Stop: 7 days  Restart: 2 hrs) Diagnostic bilateral celiac plexus block #3 with open possibility of alcohol neurolysis    Under consideration:   Diagnostic intrathecal injection of opioid analgesic (intrathecal pump trial).   Therapeutic/palliative (PRN):   Diagnostic bilateral celiac plexus block #3  (06/01/2019; 11/02/2019) (50/30/30/0) (75/75/0/0)     Recent Visits Date Type Provider Dept  03/29/20 Procedure visit Milinda Pointer, MD [facility]  03/27/20 Procedure visit Milinda Pointer, MD [facility]  03/21/20 Procedure visit Milinda Pointer, MD [facility]  03/13/20 Procedure visit Milinda Pointer, MD [facility]  03/09/20 Procedure visit Milinda Pointer, MD [facility]  03/06/20 Procedure visit Milinda Pointer, MD [facility]  03/02/20 Procedure visit Milinda Pointer, MD [facility]  Showing recent visits within past 90 days and meeting all other requirements Today's Visits Date Type Provider Dept  04/06/20 Procedure visit Milinda Pointer, MD [facility]  Showing today's visits and meeting all other requirements Future Appointments Date Type Provider Dept  04/24/20 Appointment Milinda Pointer, MD [facility]  05/23/20 Appointment Milinda Pointer, MD [facility]  Showing future appointments within next 90 days and meeting all other requirements  Disposition: Discharge home  Discharge (Date  Time): 04/06/2020;   hrs.   Primary Care Physician: [name], MD Location: [facility] Note by: Gaspar Cola, MD Date: 04/06/2020; Time: 1:38 PM  Disclaimer:  Medicine is not an Chief Strategy Officer. The only guarantee in medicine is that nothing is guaranteed. It is important to note that the decision to proceed with this intervention was based on the information collected from the patient. The Data and conclusions were drawn from the patient's questionnaire, the interview, and the physical examination. Because the information was provided in large part by the patient, it cannot be guaranteed that it has not been purposely or unconsciously manipulated. Every effort has been made to obtain as much relevant data as possible for  this evaluation. It is important to note that the conclusions that lead to this procedure are derived in large part from the available data. Always take into account that the treatment will also be dependent on availability of resources and existing treatment guidelines, considered by other Pain Management Practitioners as being common knowledge and practice, at the time of the intervention. For Medico-Legal purposes, it is also important to point out that variation in procedural techniques and pharmacological choices  are the acceptable norm. The indications, contraindications, technique, and results of the above procedure should only be interpreted and judged by a Board-Certified Interventional Pain Specialist with extensive familiarity and expertise in the same exact procedure and technique.

## 2020-04-06 NOTE — Progress Notes (Signed)
Nursing Pain Medication Assessment:  Safety precautions to be maintained throughout the outpatient stay will include: orient to surroundings, keep bed in low position, maintain call bell within reach at all times, provide assistance with transfer out of bed and ambulation.  Medication Inspection Compliance: Pill count conducted under aseptic conditions, in front of the patient. Neither the pills nor the bottle was removed from the patient's sight at any time. Once count was completed pills were immediately returned to the patient in their original bottle.  Medication: Oxycodone IR Pill/Patch Count: 34 of 60 pills remain Pill/Patch Appearance: Markings consistent with prescribed medication Bottle Appearance: Standard pharmacy container. Clearly labeled. Filled Date: 17 / 05 / 2021 Last Medication intake:  Today

## 2020-04-06 NOTE — Progress Notes (Signed)
Safety precautions to be maintained throughout the outpatient stay will include: orient to surroundings, keep bed in low position, maintain call bell within reach at all times, provide assistance with transfer out of bed and ambulation.  

## 2020-04-07 ENCOUNTER — Ambulatory Visit
Admission: RE | Admit: 2020-04-07 | Discharge: 2020-04-07 | Disposition: A | Discharge status: Home / Self Care | Payer: Medicare Other | Attending: Pain Medicine | Admitting: Pain Medicine

## 2020-04-07 ENCOUNTER — Ambulatory Visit
Admission: RE | Admit: 2020-04-07 | Discharge: 2020-04-07 | Disposition: A | Discharge status: Home / Self Care | Payer: Medicare Other | Source: Ambulatory Visit | Attending: Pain Medicine | Admitting: Pain Medicine

## 2020-04-07 ENCOUNTER — Telehealth: Payer: Self-pay | Admitting: *Deleted

## 2020-04-07 DIAGNOSIS — M5134 Other intervertebral disc degeneration, thoracic region: Secondary | ICD-10-CM | POA: Insufficient documentation

## 2020-04-07 DIAGNOSIS — Z978 Presence of other specified devices: Secondary | ICD-10-CM | POA: Insufficient documentation

## 2020-04-07 DIAGNOSIS — Z95828 Presence of other vascular implants and grafts: Secondary | ICD-10-CM

## 2020-04-07 DIAGNOSIS — C8300 Small cell B-cell lymphoma, unspecified site: Secondary | ICD-10-CM | POA: Insufficient documentation

## 2020-04-07 DIAGNOSIS — G893 Neoplasm related pain (acute) (chronic): Secondary | ICD-10-CM | POA: Diagnosis not present

## 2020-04-07 DIAGNOSIS — R1013 Epigastric pain: Secondary | ICD-10-CM | POA: Insufficient documentation

## 2020-04-07 NOTE — Telephone Encounter (Signed)
Attempted to call for post procedure follow-up. Message left. 

## 2020-04-10 ENCOUNTER — Other Ambulatory Visit: Payer: Self-pay | Admitting: Pain Medicine

## 2020-04-10 ENCOUNTER — Telehealth: Payer: Self-pay

## 2020-04-10 NOTE — Telephone Encounter (Signed)
He has a couple of days of oxycodone left and wants to know if it can be called out since he has been here so often. Please let him know

## 2020-04-10 NOTE — Telephone Encounter (Signed)
Bubble message sent to Dr. Dossie Arbour for answer.

## 2020-04-11 ENCOUNTER — Inpatient Hospital Stay: Payer: Medicare Other

## 2020-04-11 ENCOUNTER — Other Ambulatory Visit: Payer: Self-pay

## 2020-04-11 ENCOUNTER — Inpatient Hospital Stay: Payer: Medicare Other | Attending: Internal Medicine

## 2020-04-11 ENCOUNTER — Ambulatory Visit: Payer: Medicare Other | Attending: Pain Medicine | Admitting: Pain Medicine

## 2020-04-11 ENCOUNTER — Encounter: Payer: Self-pay | Admitting: Pain Medicine

## 2020-04-11 ENCOUNTER — Other Ambulatory Visit: Payer: Self-pay | Admitting: *Deleted

## 2020-04-11 VITALS — BP 122/71 | HR 92 | Temp 98.5°F | Resp 18 | Ht 68.0 in | Wt 150.0 lb

## 2020-04-11 VITALS — BP 128/79 | HR 101

## 2020-04-11 DIAGNOSIS — Z79899 Other long term (current) drug therapy: Secondary | ICD-10-CM | POA: Diagnosis not present

## 2020-04-11 DIAGNOSIS — Z7902 Long term (current) use of antithrombotics/antiplatelets: Secondary | ICD-10-CM | POA: Insufficient documentation

## 2020-04-11 DIAGNOSIS — C9110 Chronic lymphocytic leukemia of B-cell type not having achieved remission: Secondary | ICD-10-CM | POA: Diagnosis not present

## 2020-04-11 DIAGNOSIS — C8300 Small cell B-cell lymphoma, unspecified site: Secondary | ICD-10-CM

## 2020-04-11 DIAGNOSIS — M5137 Other intervertebral disc degeneration, lumbosacral region: Secondary | ICD-10-CM | POA: Diagnosis not present

## 2020-04-11 DIAGNOSIS — R319 Hematuria, unspecified: Secondary | ICD-10-CM | POA: Diagnosis not present

## 2020-04-11 DIAGNOSIS — G893 Neoplasm related pain (acute) (chronic): Secondary | ICD-10-CM | POA: Diagnosis not present

## 2020-04-11 DIAGNOSIS — R1084 Generalized abdominal pain: Secondary | ICD-10-CM

## 2020-04-11 DIAGNOSIS — G894 Chronic pain syndrome: Secondary | ICD-10-CM

## 2020-04-11 DIAGNOSIS — N189 Chronic kidney disease, unspecified: Secondary | ICD-10-CM | POA: Insufficient documentation

## 2020-04-11 DIAGNOSIS — Z95828 Presence of other vascular implants and grafts: Secondary | ICD-10-CM

## 2020-04-11 DIAGNOSIS — M4724 Other spondylosis with radiculopathy, thoracic region: Secondary | ICD-10-CM | POA: Insufficient documentation

## 2020-04-11 DIAGNOSIS — Z7289 Other problems related to lifestyle: Secondary | ICD-10-CM | POA: Insufficient documentation

## 2020-04-11 DIAGNOSIS — F17210 Nicotine dependence, cigarettes, uncomplicated: Secondary | ICD-10-CM | POA: Insufficient documentation

## 2020-04-11 DIAGNOSIS — D631 Anemia in chronic kidney disease: Secondary | ICD-10-CM

## 2020-04-11 DIAGNOSIS — G8929 Other chronic pain: Secondary | ICD-10-CM

## 2020-04-11 DIAGNOSIS — M792 Neuralgia and neuritis, unspecified: Secondary | ICD-10-CM

## 2020-04-11 DIAGNOSIS — R1013 Epigastric pain: Secondary | ICD-10-CM

## 2020-04-11 DIAGNOSIS — C8590 Non-Hodgkin lymphoma, unspecified, unspecified site: Secondary | ICD-10-CM | POA: Insufficient documentation

## 2020-04-11 DIAGNOSIS — R230 Cyanosis: Secondary | ICD-10-CM | POA: Diagnosis not present

## 2020-04-11 DIAGNOSIS — R109 Unspecified abdominal pain: Secondary | ICD-10-CM | POA: Diagnosis not present

## 2020-04-11 DIAGNOSIS — Z978 Presence of other specified devices: Secondary | ICD-10-CM

## 2020-04-11 DIAGNOSIS — F1190 Opioid use, unspecified, uncomplicated: Secondary | ICD-10-CM

## 2020-04-11 DIAGNOSIS — M48061 Spinal stenosis, lumbar region without neurogenic claudication: Secondary | ICD-10-CM | POA: Diagnosis not present

## 2020-04-11 DIAGNOSIS — I70219 Atherosclerosis of native arteries of extremities with intermittent claudication, unspecified extremity: Secondary | ICD-10-CM

## 2020-04-11 LAB — HEMATOCRIT: HCT: 27.5 % — ABNORMAL LOW (ref 39.0–52.0)

## 2020-04-11 LAB — HEMOGLOBIN: Hemoglobin: 8.6 g/dL — ABNORMAL LOW (ref 13.0–17.0)

## 2020-04-11 MED ORDER — DARBEPOETIN ALFA 300 MCG/0.6ML IJ SOSY
300.0000 ug | PREFILLED_SYRINGE | Freq: Once | INTRAMUSCULAR | Status: AC
  Administered 2020-04-11: 11:00:00 300 ug via SUBCUTANEOUS
  Filled 2020-04-11: qty 0.6

## 2020-04-11 MED ORDER — OXYCODONE HCL 10 MG PO TABS: 10.0000 mg | ORAL_TABLET | ORAL | 0 refills | Status: DC | PRN

## 2020-04-11 MED ORDER — GABAPENTIN 300 MG PO CAPS: ORAL_CAPSULE | ORAL | 0 refills | Status: DC

## 2020-04-11 NOTE — Progress Notes (Signed)
Nursing Pain Medication Assessment:  Safety precautions to be maintained throughout the outpatient stay will include: orient to surroundings, keep bed in low position, maintain call bell within reach at all times, provide assistance with transfer out of bed and ambulation.  Medication Inspection Compliance: Pill count conducted under aseptic conditions, in front of the patient. Neither the pills nor the bottle was removed from the patient's sight at any time. Once count was completed pills were immediately returned to the patient in their original bottle.  Medication: Oxycodone IR Pill/Patch Count: 6 of 60 pills remain Pill/Patch Appearance: Markings consistent with prescribed medication Bottle Appearance: Standard pharmacy container. Clearly labeled. Filled Date: 10 / 05 / 2021 Last Medication intake:  Today

## 2020-04-11 NOTE — Patient Instructions (Signed)
Opioid Overdose Opioids are drugs that are often used to treat pain. Opioids include illegal drugs, such as heroin, as well as prescription pain medicines, such as codeine, morphine, hydrocodone, oxycodone, and fentanyl. An opioid overdose happens when you take too much of an opioid. An overdose may be intentional or accidental and can happen with any type of opioid. The effects of an overdose can be mild, dangerous, or even deadly. Opioid overdose is a medical emergency. What are the causes? This condition may be caused by:  Taking too much of an opioid on purpose.  Taking too much of an opioid by accident.  Using two or more substances that contain opioids at the same time.  Taking an opioid with a substance that affects your heart, breathing, or blood pressure. These include alcohol, tranquilizers, sleeping pills, illegal drugs, and some over-the-counter medicines. This condition may also happen due to an error made by:  A health care provider who prescribes a medicine.  The pharmacist who fills the prescription order. What increases the risk? This condition is more likely in:  Children. They may be attracted to colorful pills. Because of a child's small size, even a small amount of a drug can be dangerous.  Older people. They may be taking many different drugs. Older people may have difficulty reading labels or remembering when they last took their medicine. They may also be more sensitive to the effects of opioids.  People with chronic medical conditions, especially heart, liver, kidney, or neurological diseases.  People who take an opioid for a long period of time.  People who use: ? Illegal drugs. IV heroin is especially dangerous. ? Other substances, including alcohol, while using an opioid.  People who have: ? A history of drug or alcohol abuse. ? Certain mental health conditions. ? A history of previous drug overdoses.  People who take opioids that are not prescribed  for them. What are the signs or symptoms? Symptoms of this condition depend on the type of opioid and the amount that was taken. Common symptoms include:  Sleepiness or difficulty waking from sleep.  Decrease in attention.  Confusion.  Slurred speech.  Slowed breathing and a slow pulse (bradycardia).  Nausea and vomiting.  Abnormally small pupils. Signs and symptoms that require emergency treatment include:  Cold, clammy, and pale skin.  Blue lips and fingernails.  Vomiting.  Gurgling sounds in the throat.  A pulse that is very slow or difficult to detect.  Breathing that is very irregular, slow, noisy, or difficult to detect.  Limp body.  Inability to respond to speech or be awakened from sleep (stupor).  Seizures. How is this diagnosed? This condition is diagnosed based on your symptoms and medical history. It is important to tell your health care provider:  About all of the opioids that you took.  When you took the opioids.  Whether you were drinking alcohol or using marijuana, cocaine, or other drugs. Your health care provider will do a physical exam. This exam may include:  Checking and monitoring your heart rate and rhythm, breathing rate, temperature, and blood pressure (vital signs).  Measuring oxygen levels in your blood.  Checking for abnormally small pupils. You may also have blood tests or urine tests. You may have X-rays if you are having severe breathing problems. How is this treated? This condition requires immediate medical treatment and hospitalization. Treatment is given in the hospital intensive care (ICU) setting. Supporting your vital signs and your breathing is the first step in  treating an opioid overdose. Treatment may also include:  Giving salts and minerals (electrolytes) along with fluids through an IV.  Inserting a breathing tube (endotracheal tube) in your airway to help you breathe if you cannot breathe on your own or you are in  danger of not being able to breathe on your own.  Giving oxygen through a small tube under your nose.  Passing a tube through your nose and into your stomach (nasogastric tube, or NG tube) to empty your stomach.  Giving medicines that: ? Increase your blood pressure. ? Relieve nausea and vomiting. ? Relieve abdominal pain and cramping. ? Reverse the effects of the opioid (naloxone).  Monitoring your heart and oxygen levels.  Ongoing counseling and mental health support if you intentionally overdosed or used an illegal drug. Follow these instructions at home:  Medicines  Take over-the-counter and prescription medicines only as told by your health care provider.  Always ask your health care provider about possible side effects and interactions of any new medicine that you start taking.  Keep a list of all the medicines that you take, including over-the-counter medicines. Bring this list with you to all your medical visits. General instructions  Drink enough fluid to keep your urine pale yellow.  Keep all follow-up visits as told by your health care provider. This is important. How is this prevented?  Read the drug inserts that come with your opioid pain medicines.  Take medicines only as told by your health care provider. Do not take more medicine than you are told. Do not take medicines more frequently than you are told.  Do not drink alcohol or take sedatives when taking opioids.  Do not use illegal or recreational drugs, including cocaine, ecstasy, and marijuana.  Do not take opioid medicines that are not prescribed for you.  Store all medicines in safety containers that are out of the reach of children.  Get help if you are struggling with: ? Alcohol or drug use. ? Depression or another mental health problem. ? Thoughts of hurting yourself or another person.  Keep the phone number of your local poison control center near your phone or in your mobile phone. In the  [place] the hotline of the National Poison Control Center is (800) 222-1222.  If you were prescribed naloxone, make sure you understand how to take it. Contact a health care provider if you:  Need help understanding how to take your pain medicines.  Feel your medicines are too strong.  Are concerned that your pain medicines are not working well for your pain.  Develop new symptoms or side effects when you are taking medicines. Get help right away if:  You or someone else is having symptoms of an opioid overdose. Get help even if you are not sure.  You have serious thoughts about hurting yourself or others.  You have: ? Chest pain. ? Difficulty breathing. ? A loss of consciousness. These symptoms may represent a serious problem that is an emergency. Do not wait to see if the symptoms will go away. Get medical help right away. Call your local emergency services (911 in the [place] Do not drive yourself to the hospital. If you ever feel like you may hurt yourself or others, or have thoughts about taking your own life, get help right away. You can go to your nearest emergency department or call:  Your local emergency services (911 in the [place]  A suicide crisis helpline, such as the National Suicide Prevention Lifeline at   1-800-273-8255. This is open 24 hours a day. Summary  Opioids are drugs that are often used to treat pain. Opioids include illegal drugs, such as heroin, as well as prescription pain medicines.  An opioid overdose happens when you take too much of an opioid.  Overdoses can be intentional or accidental.  Opioid overdose is very dangerous. It is a life-threatening emergency.  If you or someone you know is experiencing an opioid overdose, get help right away. This information is not intended to replace advice given to you by your health care provider. Make sure you discuss any questions you have with your health care provider. Document Revised: 04/02/2018 Document  Reviewed: 04/02/2018 [employer] Patient Education  2020 [employer]  

## 2020-04-11 NOTE — Progress Notes (Signed)
PROVIDER NOTE: Information contained herein reflects review and annotations entered in association with encounter. Interpretation of such information and data should be left to medically-trained personnel. Information provided to patient can be located elsewhere in the medical record under "Patient Instructions". Document created using STT-dictation technology, any transcriptional errors that may result from process are unintentional.    Patient: James [name]  Service Category: E/M  Provider: Gaspar Cola, MD  DOB: Nov 11, 1950  DOS: 04/11/2020  Specialty: Interventional Pain Management  MRN: [id number]  Setting: Ambulatory outpatient  PCP: [name], MD  Type: Established Patient    Referring Provider: [name], MD  Location: Office  Delivery: Face-to-face     HPI  [name], a 69 y.o. year old male, is here today because of his Chronic pain syndrome [G89.4]. James [name] primary complain today is Abdominal Pain (bilateral) Last encounter: My last encounter with him was on 04/06/2020. Pertinent problems: James [name] has CLL (chronic lymphocytic leukemia) ([place]); Generalized abdominal pain; Malignant lymphoma, small lymphocytic ([place]); Toe cyanosis; Chronic epigastric pain (1ry area of Pain); Chronic pain syndrome; Chronic abdominal pain; Abdominal wall pain in epigastric region; Thoracic radiculitis; Right-sided low back pain without sciatica; DDD (degenerative disc disease), thoracic; DDD (degenerative disc disease), lumbosacral; Lumbar facet hypertrophy (Multilevel); Cancer-related pain; and Chronic neuropathic pain on their pertinent problem list. Pain Assessment: Severity of Chronic pain is reported as a 5 /10. Location: Abdomen Right,Left,Anterior/ . Onset: More than a month ago. Quality:  . Timing: Constant. Modifying factor(s): pump, medications. Vitals:  height is 5' 8" (1.727 m) and weight is 150 lb (68 kg). His oral temperature is 98.5 F (36.9 C). His blood  pressure is 122/71 and his pulse is 92. His respiration is 18 and oxygen saturation is 100%.   Reason for encounter: both, medication management and post-procedure assessment.  The patient returns today after having had neurolytic celiac plexus block removed by the fact that on discharge that day he indicated having 100% relief of the pain now see he says that he only got 25%.  This is a problem because I am having difficulty with the inconsistencies and I am also having difficulties believing that a neurolytic block with absolute alcohol is cannot wear off in a couple of days.  He is pending to have a thoracic MRI done on Friday and I will follow with him on Monday to see how he is doing.  He claims that he is not getting better, but the patient that I am seen today is definitely not the same one that I initially saw where he was bent over with pain and clearly his current pain behavior is a lot less than when I started working with him.  However, having said that, he is here today because he has continued to use his oral oxycodone for breakthrough pain and when I look at the printout on the pump he has consistently been using 5-6 out of the 6 PCA attempts that he is allowed to half.  He does not present with any type of evidence of being somnolent or oversedated.  He does admit that he is sleeping a little better.  Today I will increase his intrathecal pump rate to compensate for what he has used.  I will give him a refill on his oral medication and I will make no changes to the PCA mode.  However 1 thing that I will do is to start him on gabapentin 300 mg at bedtime.  I am  aware that he has chronic kidney disease and therefore I will be keeping a close eye on his use of this medication.  I took a long time today to make sure that I explained clearly to him that he is not allowed to use that medication in any other way than prescribed.  He is allowed to take only 1 pill at bedtime and I will be seeing him in 1  week to see how he is doing before we go on to increase in the medication.  Today I took time to personally review the x-ray films of the lumbar and thoracic spine and I was able to identify the very tip of the intrathecal catheter at T7 vertebral body level.  This is exactly where we want that catheter.  The only thing that I am not 100% sure is whether it is within the intrathecal space or the epidural space.  If the catheter was to be in the epidural space that means that it would not be working as effectively as if it was in the intrathecal canal.  For now the patient's maximum infusion of fentanyl through the pump will be 1441.0 mcg/day, which is roughly equivalent to 60 mcg/h.  This is still within a relatively adequate level for a cancer pain patient.  However, if he was to use the medication to the maximum, he would also be getting 14.41 mg/day of bupivacaine.  At this dose, he could begin to experience some lower extremity weakness.  The patient has been warned about this and informed that he needs to let us know if he does begin to have that kind of a problem.  Right now the concentration of the fentanyl in the intrathecal pump is set at 2000 mcg/mL, which is the maximum recommended.  Post-Procedure Evaluation  Procedure (04/06/2020): Diagnostic bilateral upper rectus abdominis muscle trigger point injection #1, no fluoroscopy or IV sedation. Therapeutic bilateral neurolytic celiac plexus block #1 using absolute (98%) alcohol under fluoroscopic guidance and IV sedation Pre-procedure pain level: 6/10 Post-procedure: 0/10 (100% relief)  Sedation: Sedation provided.  Effectiveness during initial hour after procedure(Ultra-Short Term Relief): 25 %.  Local anesthetic used: Long-acting (4-6 hours) Effectiveness: Defined as any analgesic benefit obtained secondary to the administration of local anesthetics. This carries significant diagnostic value as to the etiological location, or anatomical origin,  of the pain. Duration of benefit is expected to coincide with the duration of the local anesthetic used.  Effectiveness during initial 4-6 hours after procedure(Short-Term Relief): 25 %.  Long-term benefit: Defined as any relief past the pharmacologic duration of the local anesthetics.  Effectiveness past the initial 6 hours after procedure(Long-Term Relief): 0 %.  Current benefits: Defined as benefit that persist at this time.   Analgesia:  Back to baseline Function: Back to baseline ROM: Back to baseline  Pharmacotherapy Assessment   Analgesic: Oxycodone IR 50m, 1 tab PO q 4 hrs (60 mg/day of oxycodone IR) (90 MME) + morphine ER 30 mg twice daily (60 mg/day of morphine ER) (60 MME) (150 MME/day). Highest recorded MME/day: 150 mg/day 90 day average MME/day: 77.89 mg/day   Monitoring: [place] PMP: PDMP reviewed during this encounter.       Pharmacotherapy: No side-effects or adverse reactions reported. Compliance: No problems identified. Effectiveness: Clinically acceptable.  SHart Rochester RN  04/11/2020 11:45 AM  Sign when Signing Visit Nursing Pain Medication Assessment:  Safety precautions to be maintained throughout the outpatient stay will include: orient to surroundings, keep bed in low position,  maintain call bell within reach at all times, provide assistance with transfer out of bed and ambulation.  Medication Inspection Compliance: Pill count conducted under aseptic conditions, in front of the patient. Neither the pills nor the bottle was removed from the patient's sight at any time. Once count was completed pills were immediately returned to the patient in their original bottle.  Medication: Oxycodone IR Pill/Patch Count: 6 of 60 pills remain Pill/Patch Appearance: Markings consistent with prescribed medication Bottle Appearance: Standard pharmacy container. Clearly labeled. Filled Date: 64 / 05 / 2021 Last Medication intake:  Today    UDS: No results found for:  SUMMARY   ROS  Constitutional: Denies any fever or chills Gastrointestinal: No reported hemesis, hematochezia, vomiting, or acute GI distress Musculoskeletal: Denies any acute onset joint swelling, redness, loss of ROM, or weakness Neurological: No reported episodes of acute onset apraxia, aphasia, dysarthria, agnosia, amnesia, paralysis, loss of coordination, or loss of consciousness  Medication Review  Ensure, Oxycodone HCl, PAIN MANAGEMENT IT PUMP REFILL, atorvastatin, clopidogrel, esomeprazole, folic acid, gabapentin, metFORMIN, methocarbamol, naloxegol oxalate, naloxone, pantoprazole, and sucralfate  History Review  Allergy: Mr. Kurtzman has No Known Allergies. Drug: Mr. Khim  reports no history of drug use. Alcohol:  reports current alcohol use of about 1.0 standard drink of alcohol per week. Tobacco:  reports that he has been smoking cigarettes. He has a 23.50 pack-year smoking history. He has never used smokeless tobacco. Social: Mr. Reggio  reports that he has been smoking cigarettes. He has a 23.50 pack-year smoking history. He has never used smokeless tobacco. He reports current alcohol use of about 1.0 standard drink of alcohol per week. He reports that he does not use drugs. Medical:  has a past medical history of CLL (chronic lymphocytic leukemia) ([place]), Constipation, Depression, Diabetes mellitus without complication ([place]), GERD (gastroesophageal reflux disease), Hematuria, Hyperlipidemia, Hypertension, and Therapeutic opioid induced constipation. Surgical: Mr. Koren  has a past surgical history that includes Cholecystectomy (1983); Esophagogastroduodenoscopy (egd) with propofol (N/A, 11/20/2016); Cataract extraction w/ intraocular lens implant (Bilateral); Lower Extremity Angiography (Right, 05/19/2017); Esophagogastroduodenoscopy (egd) with propofol (N/A, 10/27/2017); Colonoscopy with propofol (N/A, 10/27/2017); Lower Extremity Angiography (Left, 08/10/2018); and Intrathecal pump implant  (Left, 02/07/2020). Family: family history includes Hypertension in his sister.  Laboratory Chemistry Profile   Renal Lab Results  Component Value Date   BUN 35 (H) 03/28/2020   CREATININE 1.28 (H) 03/28/2020   GFRAA 55 (L) 01/24/2020   GFRNONAA 60 (L) 03/28/2020     Hepatic Lab Results  Component Value Date   AST 15 03/28/2020   ALT 19 03/28/2020   ALBUMIN 4.7 03/28/2020   ALKPHOS 63 03/28/2020   HCVAB NON REACTIVE 06/03/2019   AMYLASE 22 (L) 07/16/2019   LIPASE 23 09/12/2019     Electrolytes Lab Results  Component Value Date   NA 141 03/28/2020   K 4.2 03/28/2020   CL 101 03/28/2020   CALCIUM 9.3 03/28/2020   MG 2.4 06/03/2019   PHOS 4.4 06/03/2019     Bone Lab Results  Component Value Date   VD25OH 13.74 (L) 05/11/2019     Inflammation (CRP: Acute Phase) (ESR: Chronic Phase) Lab Results  Component Value Date   CRP 0.6 05/11/2019   ESRSEDRATE 39 (H) 05/11/2019   LATICACIDVEN 1.7 07/16/2019       Note: Above Lab results reviewed.  Recent Imaging Review  DG Lumbar Spine Complete W/Bend CLINICAL DATA:  Chronic thoracic and lumbar back pain.  EXAM: LUMBAR SPINE - COMPLETE WITH BENDING  VIEWS  COMPARISON:  Lumbar MRI 02/06/2020  FINDINGS: Slight straightening of normal lordosis. There is no listhesis. Limited range of motion with flexion and extension but no evidence of instability. The vertebral body heights are preserved. L4 and L5 ir obscured on the AP view due to overlying battery pack from intrathecal pain pump. Tip of the catheter projects posterior to L4. Multilevel endplate spurring with minimal disc space narrowing at L1-L2. There is multilevel facet hypertrophy, better assessed on prior MRI. No fracture or focal bone lesion. No evidence of pars defects. Aortic atherosclerosis with bi-iliac stent in place.  IMPRESSION: 1. Multilevel endplate spurring with mild disc space narrowing at L1-L2. 2. Multilevel facet hypertrophy. 3. Intrathecal  pain pump in place with tip at the level of L4. 4. Limited range of motion on flexion and extension but no evidence of instability.  Electronically Signed   By: Keith Rake M.D.   On: 04/07/2020 15:58 DG Thoracic Spine 2 View CLINICAL DATA:  Chronic thoracic and lumbar spine pain.  EXAM: THORACIC SPINE 2 VIEWS  COMPARISON:  06/16/2019  FINDINGS: There are 12 pairs of ribs. Vertebral alignment is normal. No fracture is identified. Moderate vertebral osteophytosis is noted in the lower thoracic spine, similar to the prior study. There is no advanced disc space narrowing.  IMPRESSION: Moderate lower thoracic spondylosis without evidence of acute osseous abnormality.  Electronically Signed   By: Logan Bores M.D.   On: 04/07/2020 15:58 Note: Reviewed        Procedure:          Intrathecal Drug Delivery System (IDDS):  Type: Analysis & Programming [phone]) 20% rate increase Region: Abdominal Laterality: Left  Type of Pump: Medtronic Synchromed II Delivery Route: Intrathecal Type of Pain Treated: Neuropathic/Nociceptive Primary Medication Class: Opioid/opiate  Medication, Concentration, Infusion Program, & Delivery Rate: Please see scanned programming printout.   Indications: 1. Chronic pain syndrome   2. Cancer-related pain   3. Chronic abdominal pain   4. Chronic epigastric pain (1ry area of Pain)   5. Generalized abdominal pain   6. Malignant lymphoma, small lymphocytic ([place])   7. Physical tolerance to opiate drug   8. Pharmacologic therapy   9. Presence of implanted infusion pump (Medtronic)   10. Presence of intrathecal pump (Medtronic)   11. Chronic neuropathic pain    Pain Assessment: Self-Reported Pain Score: 5 /10             Reported level is compatible with observation.        Pharmacotherapy Assessment  Analgesic: Oxycodone IR 44m, 1 tab PO q 4 hrs (60 mg/day of oxycodone IR) (90 MME) + morphine ER 30 mg twice daily (60 mg/day of morphine ER) (60  MME) (150 MME/day). Highest recorded MME/day: 150 mg/day 90 day average MME/day: 77.89 mg/day   Monitoring: [place] PMP: PDMP reviewed during this encounter.       Pharmacotherapy: No side-effects or adverse reactions reported. Compliance: No problems identified. Effectiveness: Clinically acceptable. Plan: Refer to "POC".  UDS: No results found for: SUMMARY  Intrathecal Pump Therapy Assessment  Manufacturer: Medtronic Synchromed Type: Programmable Volume: 40 mL reservoir MRI compatibility: Yes   Drug content:  Primary Medication Class:Opioid Primary Medication:PF-Fentanyl(2000 mcg/mL) Secondary Medication:PF-Bupivacaine(20 mg/mL) Other Medication:None  PTM(PCA) mode: PTM dose:25 mcg bolus Duration of bolus:1 minute Lockout interval:4hours Maximum 24-hour doses:6   Programming:  Type: Simple continuous with PCA. See pump readout for details.   Changes:  Medication Change: None at this point Rate Change: 20% increase. Today  we have reviewed the programming on his intrathecal pump and he has been using his PCA mode to the maximum meaning that he had a total of1,227.57mg/day, and he is still complaining of insufficient analgesia. For this reason, today we have increased his basal rate to1,296.414m/day.PCA mode will be kept the same withallowed activations of 6/day(1 every 4 hours).He should be able to get amaximum of 1441.0 mcg/day of fentanyl and 14.41 mg/day of bupivacaine. Theconcentration and doseof fentanyl and bupivacaine that he can get per dose will remain the same as before.  Reported side-effects or adverse reactions: None reported  Effectiveness: Described as ineffective and would like to make some changes Clinically meaningful improvement in function (CMIF): Medication does not meet basic CMIF  Plan: Analysis and programming  Pre-op H&P Assessment:  Mr. JoMaiones a [age].o. (year old), male patient, seen today for interventional treatment.  He  has a past surgical history that includes Cholecystectomy (1983); Esophagogastroduodenoscopy (egd) with propofol (N/A, 11/20/2016); Cataract extraction w/ intraocular lens implant (Bilateral); Lower Extremity Angiography (Right, 05/19/2017); Esophagogastroduodenoscopy (egd) with propofol (N/A, 10/27/2017); Colonoscopy with propofol (N/A, 10/27/2017); Lower Extremity Angiography (Left, 08/10/2018); and Intrathecal pump implant (Left, 02/07/2020). Mr. JoPfostas a current medication list which includes the following prescription(s): atorvastatin, clopidogrel, ensure, esomeprazole, folic acid, metformin, methocarbamol, naloxegol oxalate, naloxone, [START ON 05/18/2020] PAIN MANAGEMENT IT PUMP REFILL, pantoprazole, sucralfate, gabapentin, and [START ON 04/12/2020] oxycodone hcl. His primarily concern today is the Abdominal Pain (bilateral)  Initial Vital Signs:  Pulse/HCG Rate: 92  Temp: 98.5 F (36.9 C) Resp: 18 BP: 122/71 SpO2: 100 %  BMI: Estimated body mass index is 22.81 kg/m as calculated from the following:   Height as of this encounter: 5' 8" (1.727 m).   Weight as of this encounter: 150 lb (68 kg).  Risk Assessment: Allergies: Reviewed. He has No Known Allergies.  Allergy Precautions: None required Coagulopathies: Reviewed. None identified.  Blood-thinner therapy: None at this time Active Infection(s): Reviewed. None identified. Mr. JoFernandezs afebrile  Site Confirmation: Mr. JoWanlessas asked to confirm the procedure and laterality before marking the site Procedure checklist: Completed Consent: Before the procedure and under the influence of no sedative(s), amnesic(s), or anxiolytics, the patient was informed of the treatment options, risks and possible complications. To fulfill our ethical and legal obligations, as recommended by the American Medical Association's Code of Ethics, I have informed the patient of my clinical impression; the nature and purpose of the treatment or procedure; the  risks, benefits, and possible complications of the intervention; the alternatives, including doing nothing; the risk(s) and benefit(s) of the alternative treatment(s) or procedure(s); and the risk(s) and benefit(s) of doing nothing.  Mr. JoRhinehartas provided with information about the general risks and possible complications associated with most interventional procedures. These include, but are not limited to: failure to achieve desired goals, infection, bleeding, organ or nerve damage, allergic reactions, paralysis, and/or death.  In addition, he was informed of those risks and possible complications associated to this particular procedure, which include, but are not limited to: damage to the implant; failure to decrease pain; local, systemic, or serious CNS infections, intraspinal abscess with possible cord compression and paralysis, or life-threatening such as meningitis; bleeding; organ damage; nerve injury or damage with subsequent sensory, motor, and/or autonomic system dysfunction, resulting in transient or permanent pain, numbness, and/or weakness of one or several areas of the body; allergic reactions, either minor or major life-threatening, such as anaphylactic or anaphylactoid reactions.  Furthermore, Mr. JoDipaolaas  informed of those risks and complications associated with the medications. These include, but are not limited to: allergic reactions (i.e.: anaphylactic or anaphylactoid reactions); endorphine suppression; bradycardia and/or hypotension; water retention and/or peripheral vascular relaxation leading to lower extremity edema and possible stasis ulcers; respiratory depression and/or shortness of breath; decreased metabolic rate leading to weight gain; swelling or edema; medication-induced neural toxicity; particulate matter embolism and blood vessel occlusion with resultant organ, and/or nervous system infarction; and/or intrathecal granuloma formation with possible spinal cord compression and  permanent paralysis.  Before refilling the pump Mr. Kehl was informed that some of the medications used in the devise may not be FDA approved for such use and therefore it constitutes an off-label use of the medications.  Finally, he was informed that Medicine is not an exact science; therefore, there is also the possibility of unforeseen or unpredictable risks and/or possible complications that may result in a catastrophic outcome. The patient indicated having understood very clearly. We have given the patient no guarantees and we have made no promises. Enough time was given to the patient to ask questions, all of which were answered to the patient's satisfaction. Mr. Vale has indicated that he wanted to continue with the procedure. Attestation: I, the ordering provider, attest that I have discussed with the patient the benefits, risks, side-effects, alternatives, likelihood of achieving goals, and potential problems during recovery for the procedure that I have provided informed consent. Date  Time: 04/11/2020 11:41 AM  Pre-Procedure Preparation:  Monitoring: As per clinic protocol. Respiration, ETCO2, SpO2, BP, heart rate and rhythm monitor placed and checked for adequate function Safety Precautions: Patient was assessed for positional comfort and pressure points before starting the procedure. Time-out: I initiated and conducted the "Time-out" before starting the procedure, as per protocol. The patient was asked to participate by confirming the accuracy of the "Time Out" information. Verification of the correct person, site, and procedure were performed and confirmed by me, the nursing staff, and the patient. "Time-out" conducted as per Joint Commission's Universal Protocol (UP.01.01.01). Time:    Description of Procedure:          Position: Supine Target Area: Area over the pump implant. Approach: Radiofrequency communication between antenna and programmable pump. Area Prepped: Not required  for simple analysis and programming N/A Safety Precautions: At least two trained healthcare practitioners present to verify programming and calculations. Description of the Procedure: Protocol guidelines were followed.      The pump was interrogated and programmed to reflect the correct medication, volume, and dosage. The program was printed and taken to the physician for approval. Once checked and signed by the physician, a copy was provided to the patient and another scanned into the EMR.  Vitals:   04/11/20 1140  BP: 122/71  Pulse: 92  Resp: 18  Temp: 98.5 F (36.9 C)  TempSrc: Oral  SpO2: 100%  Weight: 150 lb (68 kg)  Height: 5' 8" (1.727 m)    Materials & Medications: Medtronic Programmer Medication(s): Please see chart orders for details.  Imaging Guidance:          Type of Imaging Technique: None used Indication(s): N/A Exposure Time: No patient exposure Contrast: None used. Fluoroscopic Guidance: N/A Ultrasound Guidance: N/A Interpretation: N/A  Antibiotic Prophylaxis:   Anti-infectives (From admission, onward)   None     Indication(s): None identified  Post-operative Assessment:  Post-procedure Vital Signs:  Pulse/HCG Rate: 92  Temp: 98.5 F (36.9 C) Resp: 18 BP: 122/71 SpO2: 100 %  EBL:  None  Complications: No immediate post-treatment complications observed by team, or reported by patient.  Note: The patient tolerated the entire procedure well. A repeat set of vitals were taken after the procedure and the patient was kept under observation following institutional policy, for this type of procedure. Post-procedural neurological assessment was performed, showing return to baseline, prior to discharge. The patient was provided with post-procedure discharge instructions, including a section on how to identify potential problems. Should any problems arise concerning this procedure, the patient was given instructions to immediately contact us, at any time,  without hesitation. In any case, we plan to contact the patient by telephone for a follow-up status report regarding this interventional procedure.  Comments:  No additional relevant information.  Plan of Care  Orders:  Orders Placed This Encounter  Procedures  . PUMP REPROGRAM    Follow programming protocol by having two(2) healthcare providers present during programming.    Scheduling Instructions:     Please perform the following adjustment: Increase basal rate by 20%.    Order Specific Question:   Where will this procedure be performed?    Answer:   [facility]   Chronic Opioid Analgesic:  Oxycodone IR 93m, 1 tab PO q 4 hrs (60 mg/day of oxycodone IR) (90 MME) + morphine ER 30 mg twice daily (60 mg/day of morphine ER) (60 MME) (150 MME/day). Highest recorded MME/day: 150 mg/day 90 day average MME/day: 77.89 mg/day   Medications ordered for procedure: Meds ordered this encounter  Medications  . Oxycodone HCl 10 MG TABS    Sig: Take 1 tablet (10 mg total) by mouth every 4 (four) hours as needed for up to 10 days. Maximum of 6/day.    Dispense:  60 tablet    Refill:  0    Chronic Pain: STOP Act (Not applicable) Fill 1 day early if closed on refill date. Avoid benzodiazepines within 8 hours of opioids  . gabapentin (NEURONTIN) 300 MG capsule    Sig: Take 1 capsule (300 mg total) by mouth at bedtime for 14 days, THEN 2 capsules (600 mg total) at bedtime for 14 days, THEN 3 capsules (900 mg total) at bedtime for 16 days. Follow titration schedule..Marland Kitchen   Dispense:  90 capsule    Refill:  0    Fill one day early if pharmacy is closed on scheduled refill date. May substitute for generic if available.   Medications administered: Gal F. Heimann had no medications administered during this visit.  See the medical record for exact dosing, route, and time of administration.  Follow-up plan:   Return in about 1 week (around 04/18/2020) for (F2F) F/U to evaluate gabapentin  titration and MRI results..       Interventional treatment options: Planned, scheduled, and/or pending:    NOTE: PLAVIX ANTICOAGULATION  (Stop: 7 days  Restart: 2 hrs) Diagnostic bilateral celiac plexus block #3 with open possibility of alcohol neurolysis    Under consideration:   Diagnostic intrathecal injection of opioid analgesic (intrathecal pump trial).   Therapeutic/palliative (PRN):   Diagnostic bilateral celiac plexus block #3 (06/01/2019; 11/02/2019) (50/30/30/0) (75/75/0/0)      Recent Visits Date Type Provider Dept  04/06/20 Procedure visit NMilinda Pointer MD [facility]  03/29/20 Procedure visit NMilinda Pointer MD [facility]  03/27/20 Procedure visit NMilinda Pointer MD [facility]  03/21/20 Procedure visit NMilinda Pointer MD [facility]  03[facility]20 [facility] 03/09/20 Procedure visit  Milinda Pointer, MD [facility]  03/06/20 Procedure visit Milinda Pointer, MD [facility]  03/02/20 Procedure visit Milinda Pointer, MD [facility]  Showing recent visits within past 90 days and meeting all other requirements Today's Visits Date Type Provider Dept  04/11/20 Office Visit Milinda Pointer, MD [facility]  Showing today's visits and meeting all other requirements Future Appointments Date Type Provider Dept  04/18/20 Appointment Milinda Pointer, MD [facility]  05/11/20 Appointment Milinda Pointer, MD [facility]  Showing future appointments within next 90 days and meeting all other requirements  Disposition: Discharge home  Discharge (Date  Time): 04/11/2020; 1240 hrs.   Primary Care Physician: [name], MD Location: [facility] Note by: Gaspar Cola, MD Date: 04/11/2020; Time: 12:56 PM  Disclaimer:  Medicine is not an Chief Strategy Officer. The only  guarantee in medicine is that nothing is guaranteed. It is important to note that the decision to proceed with this intervention was based on the information collected from the patient. The Data and conclusions were drawn from the patient's questionnaire, the interview, and the physical examination. Because the information was provided in large part by the patient, it cannot be guaranteed that it has not been purposely or unconsciously manipulated. Every effort has been made to obtain as much relevant data as possible for this evaluation. It is important to note that the conclusions that lead to this procedure are derived in large part from the available data. Always take into account that the treatment will also be dependent on availability of resources and existing treatment guidelines, considered by other Pain Management Practitioners as being common knowledge and practice, at the time of the intervention. For Medico-Legal purposes, it is also important to point out that variation in procedural techniques and pharmacological choices are the acceptable norm. The indications, contraindications, technique, and results of the above procedure should only be interpreted and judged by a Board-Certified Interventional Pain Specialist with extensive familiarity and expertise in the same exact procedure and technique.

## 2020-04-17 ENCOUNTER — Ambulatory Visit
Admission: RE | Admit: 2020-04-17 | Discharge: 2020-04-17 | Disposition: A | Discharge status: Home / Self Care | Payer: Medicare Other | Source: Ambulatory Visit | Attending: Pain Medicine | Admitting: Pain Medicine

## 2020-04-17 ENCOUNTER — Other Ambulatory Visit: Payer: Self-pay

## 2020-04-17 DIAGNOSIS — Z95828 Presence of other vascular implants and grafts: Secondary | ICD-10-CM | POA: Diagnosis present

## 2020-04-17 DIAGNOSIS — G893 Neoplasm related pain (acute) (chronic): Secondary | ICD-10-CM | POA: Diagnosis present

## 2020-04-17 DIAGNOSIS — C8300 Small cell B-cell lymphoma, unspecified site: Secondary | ICD-10-CM

## 2020-04-17 DIAGNOSIS — R1013 Epigastric pain: Secondary | ICD-10-CM

## 2020-04-17 DIAGNOSIS — Z978 Presence of other specified devices: Secondary | ICD-10-CM | POA: Diagnosis present

## 2020-04-17 DIAGNOSIS — M5134 Other intervertebral disc degeneration, thoracic region: Secondary | ICD-10-CM | POA: Diagnosis present

## 2020-04-17 DIAGNOSIS — M4854XS Collapsed vertebra, not elsewhere classified, thoracic region, sequela of fracture: Secondary | ICD-10-CM | POA: Insufficient documentation

## 2020-04-17 NOTE — Progress Notes (Signed)
PROVIDER NOTE: Information contained herein reflects review and annotations entered in association with encounter. Interpretation of such information and data should be left to medically-trained personnel. Information provided to patient can be located elsewhere in the medical record under "Patient Instructions". Document created using STT-dictation technology, any transcriptional errors that may result from process are unintentional.    Patient: James [name]  Service Category: Procedure  Provider: Gaspar Cola, MD  DOB: 1951/03/12  DOS: 04/18/2020  Location: [facility]  MRN: [id number]  Setting: Ambulatory - outpatient  Referring Provider: No ref. provider found  Type: Established Patient  Specialty: Interventional Pain Management  PCP: [name], MD   Primary Reason for Visit: Interventional Pain Management Treatment. CC: Abdominal Pain  Procedure:          Intrathecal Drug Delivery System (IDDS):  Type: Analysis & Programming [phone])       Region: Abdominal Laterality: Left  Type of Pump: Medtronic Synchromed II Delivery Route: Intrathecal Type of Pain Treated: Neuropathic/Nociceptive Primary Medication Class: Opioid/opiate  Medication, Concentration, Infusion Program, & Delivery Rate: Please see scanned programming printout.   Indications: 1. Chronic abdominal pain   2. Cancer-related pain   3. Chronic epigastric pain (1ry area of Pain)   4. Abdominal wall pain in epigastric region   5. Non-traumatic compression fracture of T2 thoracic vertebra, sequela   6. Non-traumatic compression fracture of T1 thoracic vertebra, sequela   7. Non-traumatic compression fracture of T3 thoracic vertebra, sequela   8. DDD (degenerative disc disease), thoracic   9. Malignant lymphoma, small lymphocytic ([place])   10. Pharmacologic therapy   11. Presence of implanted infusion pump (Medtronic)   12. Presence of intrathecal pump (Medtronic)   13. Chronic  anticoagulation (Plavix)    Pain Assessment: Self-Reported Pain Score: 5 /10             Reported level is compatible with observation.        The patient comes into the clinics today for follow-up evaluation and to go over the results of the thoracic MRI.  The results can be seen below.  Again he indicates that he is not getting any type of benefit from the pump, but I just had to look at him to see that he is not in the same distress that he was when he first came in to see me.  After carefully evaluating the use of his pump, it is evident that he has maxed out the PCA component of the pump.  Today we will add when he has been requesting on a regular basis to his basal rate.  We again will leave the PCA option so that he can continue to cover additional needs, if required.  (04/17/2020) THORACIC MRI: FINDINGS: Alignment:  Straightening of the thoracic kyphosis. Vertebrae: No acute fracture, evidence of discitis, or bone lesion. Mild chronic compression fractures of T1, T2 and T3. No marrow edema or retropulsion. Cord:  Normal signal and morphology. Paraspinal and other soft tissues: Negative.  Disc levels: Small posterior disc protrusion at T8-9 without significant spinal canal or neural foraminal stenosis. Facet degenerative changes with ligamentum flavum redundancy, mostly on the left side at T10-11 with mild indentation on the thecal sac without significant spinal canal or neural foraminal stenosis. No significant disc herniation, spinal canal or neural foraminal stenosis in the other thoracic levels.  IMPRESSION: 1. No acute abnormality of the thoracic spine. 2. Mild degenerative changes of the thoracic spine without significant spinal canal or neural  foraminal stenosis at any level.  Gabapentin titration: Currently at 300 mg PO HS. No problems. Given permission to go up to 600 mg HS.  Pharmacotherapy Assessment  Analgesic: Oxycodone IR 3m, 1 tab PO q 4 hrs (60 mg/day of oxycodone  IR) (90 MME) + morphine ER 30 mg twice daily (60 mg/day of morphine ER) (60 MME) (150 MME/day). Highest recorded MME/day: 150 mg/day 90 day average MME/day: 77.89 mg/day   Monitoring: [place] PMP: PDMP reviewed during this encounter.       Pharmacotherapy: No side-effects or adverse reactions reported. Compliance: No problems identified. Effectiveness: Clinically acceptable. Plan: Refer to "POC".  UDS: No results found for: SUMMARY  Intrathecal Pump Therapy Assessment  Manufacturer: Medtronic Synchromed Type: Programmable Volume: 40 mL reservoir MRI compatibility: Yes   Drug content:  Primary Medication Class:Opioid Primary Medication:PF-Fentanyl(2000 mcg/mL) Secondary Medication:PF-Bupivacaine(20 mg/mL) Other Medication:None  PTM(PCA) mode: PTM dose:25 mcg bolus Duration of bolus:1 minute Lockout interval:4hours Maximum 24-hour doses:6   Programming:  Type: Simple continuous. See pump readout for details.   Changes:  Medication Change: None at this point Rate Change: 10 % increase. Today we have reviewed the programming on his intrathecal pump and he has been using his PCA mode to the maximum meaning that he had a total of 1,227.1 mcg/day, and he is still complaining of insufficient analgesia.  For this reason, today we have increased his basal rate to 1544 mcg/day. PCA mode will be kept the same with allowed activations of 6/day (1 every 4 hours). He should be able to get a maximum of 1682.9 mcg/day of fentanyl and 16.829 mg/day of bupivacaine.  The concentration and dose of fentanyl and bupivacaine that he can get per dose will remain the same as before.  Reported side-effects or adverse reactions: None reported  Effectiveness: Described as relatively effective, allowing for increase in activities of daily living (ADL) Clinically meaningful improvement in function (CMIF): Sustained CMIF goals met  Plan: Pump refill today  Pre-op H&P Assessment:  Mr. JBurley is a 69y.o. (year old), male patient, seen today for interventional treatment. He  has a past surgical history that includes Cholecystectomy (1983); Esophagogastroduodenoscopy (egd) with propofol (N/A, 11/20/2016); Cataract extraction w/ intraocular lens implant (Bilateral); Lower Extremity Angiography (Right, 05/19/2017); Esophagogastroduodenoscopy (egd) with propofol (N/A, 10/27/2017); Colonoscopy with propofol (N/A, 10/27/2017); Lower Extremity Angiography (Left, 08/10/2018); and Intrathecal pump implant (Left, 02/07/2020). Mr. JNadalhas a current medication list which includes the following prescription(s): atorvastatin, clopidogrel, ensure, esomeprazole, folic acid, gabapentin, metformin, methocarbamol, naloxegol oxalate, naloxone, [START ON 04/22/2020] oxycodone hcl, [START ON 05/18/2020] PAIN MANAGEMENT IT PUMP REFILL, pantoprazole, sucralfate, and PAIN MANAGEMENT IT PUMP REFILL. His primarily concern today is the Abdominal Pain  Initial Vital Signs:  Pulse/HCG Rate: 90  Temp: 97.7 F (36.5 C) Resp: 14 BP: 113/70 SpO2: 100 %  BMI: Estimated body mass index is 22.81 kg/m as calculated from the following:   Height as of this encounter: 5' 8"  (1.727 m).   Weight as of this encounter: 150 lb (68 kg).  Risk Assessment: Allergies: Reviewed. He has No Known Allergies.  Allergy Precautions: None required Coagulopathies: Reviewed. None identified.  Blood-thinner therapy: None at this time Active Infection(s): Reviewed. None identified. Mr. JStefanskiis afebrile  Site Confirmation: Mr. JChandleywas asked to confirm the procedure and laterality before marking the site Procedure checklist: Completed Consent: Before the procedure and under the influence of no sedative(s), amnesic(s), or anxiolytics, the patient was informed of the treatment options, risks and possible complications.  To fulfill our ethical and legal obligations, as recommended by the American Medical Association's Code of Ethics, I have informed  the patient of my clinical impression; the nature and purpose of the treatment or procedure; the risks, benefits, and possible complications of the intervention; the alternatives, including doing nothing; the risk(s) and benefit(s) of the alternative treatment(s) or procedure(s); and the risk(s) and benefit(s) of doing nothing.  Mr. Bostic was provided with information about the general risks and possible complications associated with most interventional procedures. These include, but are not limited to: failure to achieve desired goals, infection, bleeding, organ or nerve damage, allergic reactions, paralysis, and/or death.  In addition, he was informed of those risks and possible complications associated to this particular procedure, which include, but are not limited to: damage to the implant; failure to decrease pain; local, systemic, or serious CNS infections, intraspinal abscess with possible cord compression and paralysis, or life-threatening such as meningitis; bleeding; organ damage; nerve injury or damage with subsequent sensory, motor, and/or autonomic system dysfunction, resulting in transient or permanent pain, numbness, and/or weakness of one or several areas of the body; allergic reactions, either minor or major life-threatening, such as anaphylactic or anaphylactoid reactions.  Furthermore, Mr. Mortellaro was informed of those risks and complications associated with the medications. These include, but are not limited to: allergic reactions (i.e.: anaphylactic or anaphylactoid reactions); endorphine suppression; bradycardia and/or hypotension; water retention and/or peripheral vascular relaxation leading to lower extremity edema and possible stasis ulcers; respiratory depression and/or shortness of breath; decreased metabolic rate leading to weight gain; swelling or edema; medication-induced neural toxicity; particulate matter embolism and blood vessel occlusion with resultant organ, and/or nervous  system infarction; and/or intrathecal granuloma formation with possible spinal cord compression and permanent paralysis.  Before refilling the pump Mr. Kersh was informed that some of the medications used in the devise may not be FDA approved for such use and therefore it constitutes an off-label use of the medications.  Finally, he was informed that Medicine is not an exact science; therefore, there is also the possibility of unforeseen or unpredictable risks and/or possible complications that may result in a catastrophic outcome. The patient indicated having understood very clearly. We have given the patient no guarantees and we have made no promises. Enough time was given to the patient to ask questions, all of which were answered to the patient's satisfaction. Mr. Fulfer has indicated that he wanted to continue with the procedure. Attestation: I, the ordering provider, attest that I have discussed with the patient the benefits, risks, side-effects, alternatives, likelihood of achieving goals, and potential problems during recovery for the procedure that I have provided informed consent. Date  Time: 04/18/2020  2:31 PM  Pre-Procedure Preparation:  Monitoring: As per clinic protocol. Respiration, ETCO2, SpO2, BP, heart rate and rhythm monitor placed and checked for adequate function Safety Precautions: Patient was assessed for positional comfort and pressure points before starting the procedure. Time-out: I initiated and conducted the "Time-out" before starting the procedure, as per protocol. The patient was asked to participate by confirming the accuracy of the "Time Out" information. Verification of the correct person, site, and procedure were performed and confirmed by me, the nursing staff, and the patient. "Time-out" conducted as per Joint Commission's Universal Protocol (UP.01.01.01). Time:    Description of Procedure:          Position: Supine Target Area: Central-port of intrathecal  pump. Approach: Anterior, 90 degree angle approach. Area Prepped: Entire Area around the pump implant.  DuraPrep (Iodine Povacrylex [0.7% available iodine] and Isopropyl Alcohol, 74% w/w) Safety Precautions: Aspiration looking for blood return was conducted prior to all injections. At no point did we inject any substances, as a needle was being advanced. No attempts were made at seeking any paresthesias. Safe injection practices and needle disposal techniques used. Medications properly checked for expiration dates. SDV (single dose vial) medications used. Description of the Procedure: Protocol guidelines were followed. Two nurses trained to do implant refills were present during the entire procedure. The refill medication was checked by both healthcare providers as well as the patient. The patient was included in the "Time-out" to verify the medication. The patient was placed in position. The pump was identified. The area was prepped in the usual manner. The sterile template was positioned over the pump, making sure the side-port location matched that of the pump. Both, the pump and the template were held for stability. The needle provided in the Medtronic Kit was then introduced thru the center of the template and into the central port. The pump content was aspirated and discarded volume documented. The new medication was slowly infused into the pump, thru the filter, making sure to avoid overpressure of the device. The needle was then removed and the area cleansed, making sure to leave some of the prepping solution back to take advantage of its long term bactericidal properties. The pump was interrogated and programmed to reflect the correct medication, volume, and dosage. The program was printed and taken to the physician for approval. Once checked and signed by the physician, a copy was provided to the patient and another scanned into the EMR. Vitals:   04/18/20 1429  BP: 113/70  Pulse: 90  Resp: 14   Temp: 97.7 F (36.5 C)  TempSrc: Temporal  SpO2: 100%  Weight: 150 lb (68 kg)  Height: 5' 8"  (1.727 m)    Start Time:   hrs. End Time:   hrs. Materials & Medications: Medtronic Refill Kit Medication(s): Please see chart orders for details.  Imaging Guidance:          Type of Imaging Technique: None used Indication(s): N/A Exposure Time: No patient exposure Contrast: None used. Fluoroscopic Guidance: N/A Ultrasound Guidance: N/A Interpretation: N/A  Antibiotic Prophylaxis:   Anti-infectives (From admission, onward)   None     Indication(s): None identified  Post-operative Assessment:  Post-procedure Vital Signs:  Pulse/HCG Rate: 90  Temp: 97.7 F (36.5 C) Resp: 14 BP: 113/70 SpO2: 100 %  EBL: None  Complications: No immediate post-treatment complications observed by team, or reported by patient.  Note: The patient tolerated the entire procedure well. A repeat set of vitals were taken after the procedure and the patient was kept under observation following institutional policy, for this type of procedure. Post-procedural neurological assessment was performed, showing return to baseline, prior to discharge. The patient was provided with post-procedure discharge instructions, including a section on how to identify potential problems. Should any problems arise concerning this procedure, the patient was given instructions to immediately contact us, at any time, without hesitation. In any case, we plan to contact the patient by telephone for a follow-up status report regarding this interventional procedure.  Comments:  No additional relevant information.  Plan of Care  Orders:  Orders Placed This Encounter  Procedures  . PUMP REPROGRAM    Follow programming protocol by having two(2) healthcare providers present during programming.    Scheduling Instructions:     Please perform the following adjustment: Increase rate by  basal rate to 1400 mcg/day of PF-Fentanyl.     Order Specific Question:   Where will this procedure be performed?    Answer:   [facility]   Chronic Opioid Analgesic:  Oxycodone IR 24m, 1 tab PO q 4 hrs (60 mg/day of oxycodone IR) (90 MME) + morphine ER 30 mg twice daily (60 mg/day of morphine ER) (60 MME) (150 MME/day). Highest recorded MME/day: 150 mg/day 90 day average MME/day: 77.89 mg/day   Medications ordered for procedure: Meds ordered this encounter  Medications  . Oxycodone HCl 10 MG TABS    Sig: Take 1 tablet (10 mg total) by mouth every 4 (four) hours as needed for up to 10 days. Maximum of 6/day.    Dispense:  60 tablet    Refill:  0    Chronic Pain: STOP Act (Not applicable) Fill 1 day early if closed on refill date. Avoid benzodiazepines within 8 hours of opioids   Medications administered: Rudy F. Buege had no medications administered during this visit.  See the medical record for exact dosing, route, and time of administration.  Follow-up plan:   Return in about 1 week (around 04/25/2020) for (F2F), (Med Mgmt) for pump adjustment.       Interventional treatment options: Planned, scheduled, and/or pending:    NOTE: PLAVIX ANTICOAGULATION  (Stop: 7 days  Restart: 2 hrs) Diagnostic bilateral celiac plexus block #3 with open possibility of alcohol neurolysis    Under consideration:   Diagnostic intrathecal injection of opioid analgesic (intrathecal pump trial).   Therapeutic/palliative (PRN):   Diagnostic bilateral celiac plexus block #3 (06/01/2019; 11/02/2019) (50/30/30/0) (75/75/0/0)       Recent Visits Date Type Provider Dept  04/18/20 Office Visit NMilinda Pointer MD [facility]  04/11/20 Office Visit NMilinda Pointer MD [facility]  04/06/20 Procedure visit NMilinda Pointer MD [facility]  03/29/20 Procedure visit NMilinda Pointer MD [facility]  03/27/20 Procedure visit NMilinda Pointer MD [facility]  03/21/20 Procedure visit  NMilinda Pointer MD [facility]  03/13/20 Procedure visit NMilinda Pointer MD [facility]  03/09/20 Procedure visit NMilinda Pointer MD [facility]  03/06/20 Procedure visit NMilinda Pointer MD [facility]  03/02/20 Procedure visit NMilinda Pointer MD [facility]  Showing recent visits within past 90 days and meeting all other requirements Future Appointments Date Type Provider Dept  05/01/20 Appointment NMilinda Pointer MD [facility]  05/04/20 Appointment NMilinda Pointer MD [facility]  Showing future appointments within next 90 days and meeting all other requirements  Disposition: Discharge home  Discharge (Date  Time): 04/18/2020; 1535 hrs.   Primary Care Physician: [facility]Tracie Harrier MD [facility] Note by: FGaspar Cola MD Date: 04/18/2020; Time: 3:05 PM  Disclaimer:  Medicine is not an eChief Strategy Officer The only guarantee in medicine is that nothing is guaranteed. It is important to note that the decision to proceed with this intervention was based on the information collected from the patient. The Data and conclusions were drawn from the patient's questionnaire, the interview, and the physical examination. Because the information was provided in large part by the patient, it cannot be guaranteed that it has not been purposely or unconsciously manipulated. Every effort has been made to obtain as much relevant data as possible for this evaluation. It is important to note that the conclusions that lead to this procedure are derived in large part from the available data. Always  take into account that the treatment will also be dependent on availability of resources and existing treatment guidelines, considered by other Pain Management Practitioners as being common knowledge and practice, at the time of the intervention. For Medico-Legal purposes, it is also  important to point out that variation in procedural techniques and pharmacological choices are the acceptable norm. The indications, contraindications, technique, and results of the above procedure should only be interpreted and judged by a Board-Certified Interventional Pain Specialist with extensive familiarity and expertise in the same exact procedure and technique.

## 2020-04-18 ENCOUNTER — Ambulatory Visit: Payer: Medicare Other | Attending: Pain Medicine | Admitting: Pain Medicine

## 2020-04-18 ENCOUNTER — Encounter: Payer: Self-pay | Admitting: Pain Medicine

## 2020-04-18 VITALS — BP 113/70 | HR 90 | Temp 97.7°F | Resp 14 | Ht 68.0 in | Wt 150.0 lb

## 2020-04-18 DIAGNOSIS — C8590 Non-Hodgkin lymphoma, unspecified, unspecified site: Secondary | ICD-10-CM | POA: Insufficient documentation

## 2020-04-18 DIAGNOSIS — M47814 Spondylosis without myelopathy or radiculopathy, thoracic region: Secondary | ICD-10-CM | POA: Diagnosis not present

## 2020-04-18 DIAGNOSIS — G894 Chronic pain syndrome: Secondary | ICD-10-CM | POA: Diagnosis not present

## 2020-04-18 DIAGNOSIS — I70219 Atherosclerosis of native arteries of extremities with intermittent claudication, unspecified extremity: Secondary | ICD-10-CM

## 2020-04-18 DIAGNOSIS — R109 Unspecified abdominal pain: Secondary | ICD-10-CM | POA: Insufficient documentation

## 2020-04-18 DIAGNOSIS — G8929 Other chronic pain: Secondary | ICD-10-CM

## 2020-04-18 DIAGNOSIS — G893 Neoplasm related pain (acute) (chronic): Secondary | ICD-10-CM

## 2020-04-18 DIAGNOSIS — Z95828 Presence of other vascular implants and grafts: Secondary | ICD-10-CM

## 2020-04-18 DIAGNOSIS — Z79899 Other long term (current) drug therapy: Secondary | ICD-10-CM

## 2020-04-18 DIAGNOSIS — M5134 Other intervertebral disc degeneration, thoracic region: Secondary | ICD-10-CM | POA: Diagnosis not present

## 2020-04-18 DIAGNOSIS — Z978 Presence of other specified devices: Secondary | ICD-10-CM

## 2020-04-18 DIAGNOSIS — C8300 Small cell B-cell lymphoma, unspecified site: Secondary | ICD-10-CM

## 2020-04-18 DIAGNOSIS — R1013 Epigastric pain: Secondary | ICD-10-CM | POA: Diagnosis not present

## 2020-04-18 DIAGNOSIS — Z7901 Long term (current) use of anticoagulants: Secondary | ICD-10-CM | POA: Diagnosis not present

## 2020-04-18 DIAGNOSIS — M4854XS Collapsed vertebra, not elsewhere classified, thoracic region, sequela of fracture: Secondary | ICD-10-CM | POA: Diagnosis not present

## 2020-04-18 DIAGNOSIS — Z7902 Long term (current) use of antithrombotics/antiplatelets: Secondary | ICD-10-CM | POA: Insufficient documentation

## 2020-04-18 MED ORDER — OXYCODONE HCL 10 MG PO TABS: 10.0000 mg | ORAL_TABLET | ORAL | 0 refills | Status: DC | PRN

## 2020-04-18 NOTE — Progress Notes (Signed)
Nursing Pain Medication Assessment:  Safety precautions to be maintained throughout the outpatient stay will include: orient to surroundings, keep bed in low position, maintain call bell within reach at all times, provide assistance with transfer out of bed and ambulation.  Medication Inspection Compliance: Pill count conducted under aseptic conditions, in front of the patient. Neither the pills nor the bottle was removed from the patient's sight at any time. Once count was completed pills were immediately returned to the patient in their original bottle.  Medication: Oxycodone IR Pill/Patch Count: 34 of 60 pills remain Pill/Patch Appearance: Markings consistent with prescribed medication Bottle Appearance: Standard pharmacy container. Clearly labeled. Filled Date:04/12/2020 Last Medication intake:  Today

## 2020-04-19 ENCOUNTER — Other Ambulatory Visit: Payer: Self-pay

## 2020-04-19 DIAGNOSIS — G8929 Other chronic pain: Secondary | ICD-10-CM

## 2020-04-19 DIAGNOSIS — G894 Chronic pain syndrome: Secondary | ICD-10-CM

## 2020-04-19 DIAGNOSIS — G893 Neoplasm related pain (acute) (chronic): Secondary | ICD-10-CM

## 2020-04-19 MED ORDER — PAIN MANAGEMENT IT PUMP REFILL: 1.0000 | Freq: Once | INTRATHECAL | 0 refills | Status: AC

## 2020-04-24 ENCOUNTER — Ambulatory Visit: Payer: Medicare Other | Admitting: Pain Medicine

## 2020-04-25 ENCOUNTER — Inpatient Hospital Stay: Payer: Medicare Other

## 2020-04-25 VITALS — BP 146/76 | HR 86

## 2020-04-25 DIAGNOSIS — D631 Anemia in chronic kidney disease: Secondary | ICD-10-CM

## 2020-04-25 DIAGNOSIS — C9110 Chronic lymphocytic leukemia of B-cell type not having achieved remission: Secondary | ICD-10-CM

## 2020-04-25 DIAGNOSIS — N189 Chronic kidney disease, unspecified: Secondary | ICD-10-CM | POA: Diagnosis not present

## 2020-04-25 LAB — HEMOGLOBIN: Hemoglobin: 8.1 g/dL — ABNORMAL LOW (ref 13.0–17.0)

## 2020-04-25 LAB — HEMATOCRIT: HCT: 26.6 % — ABNORMAL LOW (ref 39.0–52.0)

## 2020-04-25 MED ORDER — DARBEPOETIN ALFA 300 MCG/0.6ML IJ SOSY
300.0000 ug | PREFILLED_SYRINGE | Freq: Once | INTRAMUSCULAR | Status: AC
  Administered 2020-04-25: 11:00:00 300 ug via SUBCUTANEOUS
  Filled 2020-04-25: qty 0.6
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
## 2020-05-01 ENCOUNTER — Ambulatory Visit: Payer: Medicare Other | Attending: Pain Medicine | Admitting: Pain Medicine

## 2020-05-01 ENCOUNTER — Encounter: Payer: Self-pay | Admitting: Pain Medicine

## 2020-05-01 ENCOUNTER — Other Ambulatory Visit: Payer: Self-pay

## 2020-05-01 DIAGNOSIS — R109 Unspecified abdominal pain: Secondary | ICD-10-CM

## 2020-05-01 DIAGNOSIS — M792 Neuralgia and neuritis, unspecified: Secondary | ICD-10-CM

## 2020-05-01 DIAGNOSIS — G894 Chronic pain syndrome: Secondary | ICD-10-CM | POA: Diagnosis not present

## 2020-05-01 DIAGNOSIS — R1013 Epigastric pain: Secondary | ICD-10-CM

## 2020-05-01 DIAGNOSIS — C9110 Chronic lymphocytic leukemia of B-cell type not having achieved remission: Secondary | ICD-10-CM

## 2020-05-01 DIAGNOSIS — G893 Neoplasm related pain (acute) (chronic): Secondary | ICD-10-CM

## 2020-05-01 DIAGNOSIS — Z79899 Other long term (current) drug therapy: Secondary | ICD-10-CM

## 2020-05-01 DIAGNOSIS — G8929 Other chronic pain: Secondary | ICD-10-CM

## 2020-05-01 DIAGNOSIS — C8300 Small cell B-cell lymphoma, unspecified site: Secondary | ICD-10-CM

## 2020-05-01 DIAGNOSIS — M4854XS Collapsed vertebra, not elsewhere classified, thoracic region, sequela of fracture: Secondary | ICD-10-CM

## 2020-05-01 MED ORDER — GABAPENTIN 300 MG PO CAPS: ORAL_CAPSULE | ORAL | 0 refills | Status: DC

## 2020-05-01 MED ORDER — OXYCODONE HCL 10 MG PO TABS: 10.0000 mg | ORAL_TABLET | ORAL | 0 refills | Status: DC | PRN

## 2020-05-04 ENCOUNTER — Other Ambulatory Visit: Payer: Self-pay

## 2020-05-04 ENCOUNTER — Encounter: Payer: Self-pay | Admitting: Pain Medicine

## 2020-05-04 ENCOUNTER — Ambulatory Visit: Payer: Medicare Other | Attending: Pain Medicine | Admitting: Pain Medicine

## 2020-05-04 VITALS — BP 126/71 | HR 80 | Temp 97.5°F | Resp 16 | Ht 68.0 in | Wt 150.0 lb

## 2020-05-04 DIAGNOSIS — M792 Neuralgia and neuritis, unspecified: Secondary | ICD-10-CM | POA: Insufficient documentation

## 2020-05-04 DIAGNOSIS — C8300 Small cell B-cell lymphoma, unspecified site: Secondary | ICD-10-CM | POA: Diagnosis present

## 2020-05-04 DIAGNOSIS — G893 Neoplasm related pain (acute) (chronic): Secondary | ICD-10-CM | POA: Diagnosis present

## 2020-05-04 DIAGNOSIS — G894 Chronic pain syndrome: Secondary | ICD-10-CM | POA: Diagnosis present

## 2020-05-04 DIAGNOSIS — C9110 Chronic lymphocytic leukemia of B-cell type not having achieved remission: Secondary | ICD-10-CM | POA: Diagnosis present

## 2020-05-04 DIAGNOSIS — Z79899 Other long term (current) drug therapy: Secondary | ICD-10-CM | POA: Diagnosis present

## 2020-05-04 DIAGNOSIS — M4854XS Collapsed vertebra, not elsewhere classified, thoracic region, sequela of fracture: Secondary | ICD-10-CM | POA: Diagnosis present

## 2020-05-04 DIAGNOSIS — Z7901 Long term (current) use of anticoagulants: Secondary | ICD-10-CM

## 2020-05-04 DIAGNOSIS — R1013 Epigastric pain: Secondary | ICD-10-CM

## 2020-05-04 DIAGNOSIS — Z978 Presence of other specified devices: Secondary | ICD-10-CM | POA: Diagnosis present

## 2020-05-04 DIAGNOSIS — R109 Unspecified abdominal pain: Secondary | ICD-10-CM

## 2020-05-04 DIAGNOSIS — G8929 Other chronic pain: Secondary | ICD-10-CM

## 2020-05-04 DIAGNOSIS — Z451 Encounter for adjustment and management of infusion pump: Secondary | ICD-10-CM

## 2020-05-04 DIAGNOSIS — Z95828 Presence of other vascular implants and grafts: Secondary | ICD-10-CM | POA: Diagnosis present

## 2020-05-04 MED ORDER — OXYCODONE HCL 10 MG PO TABS: 10.0000 mg | ORAL_TABLET | ORAL | 0 refills | Status: DC | PRN

## 2020-05-04 NOTE — Patient Instructions (Addendum)
Opioid Overdose Opioids are drugs that are often used to treat pain. Opioids include illegal drugs, such as heroin, as well as prescription pain medicines, such as codeine, morphine, hydrocodone, oxycodone, and fentanyl. An opioid overdose happens when you take too much of an opioid. An overdose may be intentional or accidental and can happen with any type of opioid. The effects of an overdose can be mild, dangerous, or even deadly. Opioid overdose is a medical emergency. What are the causes? This condition may be caused by:  Taking too much of an opioid on purpose.  Taking too much of an opioid by accident.  Using two or more substances that contain opioids at the same time.  Taking an opioid with a substance that affects your heart, breathing, or blood pressure. These include alcohol, tranquilizers, sleeping pills, illegal drugs, and some over-the-counter medicines. This condition may also happen due to an error made by:  A health care provider who prescribes a medicine.  The pharmacist who fills the prescription order. What increases the risk? This condition is more likely in:  Children. They may be attracted to colorful pills. Because of a child's small size, even a small amount of a drug can be dangerous.  Older people. They may be taking many different drugs. Older people may have difficulty reading labels or remembering when they last took their medicine. They may also be more sensitive to the effects of opioids.  People with chronic medical conditions, especially heart, liver, kidney, or neurological diseases.  People who take an opioid for a long period of time.  People who use: ? Illegal drugs. IV heroin is especially dangerous. ? Other substances, including alcohol, while using an opioid.  People who have: ? A history of drug or alcohol abuse. ? Certain mental health conditions. ? A history of previous drug overdoses.  People who take opioids that are not prescribed  for them. What are the signs or symptoms? Symptoms of this condition depend on the type of opioid and the amount that was taken. Common symptoms include:  Sleepiness or difficulty waking from sleep.  Decrease in attention.  Confusion.  Slurred speech.  Slowed breathing and a slow pulse (bradycardia).  Nausea and vomiting.  Abnormally small pupils. Signs and symptoms that require emergency treatment include:  Cold, clammy, and pale skin.  Blue lips and fingernails.  Vomiting.  Gurgling sounds in the throat.  A pulse that is very slow or difficult to detect.  Breathing that is very irregular, slow, noisy, or difficult to detect.  Limp body.  Inability to respond to speech or be awakened from sleep (stupor).  Seizures. How is this diagnosed? This condition is diagnosed based on your symptoms and medical history. It is important to tell your health care provider:  About all of the opioids that you took.  When you took the opioids.  Whether you were drinking alcohol or using marijuana, cocaine, or other drugs. Your health care provider will do a physical exam. This exam may include:  Checking and monitoring your heart rate and rhythm, breathing rate, temperature, and blood pressure (vital signs).  Measuring oxygen levels in your blood.  Checking for abnormally small pupils. You may also have blood tests or urine tests. You may have X-rays if you are having severe breathing problems. How is this treated? This condition requires immediate medical treatment and hospitalization. Treatment is given in the hospital intensive care (ICU) setting. Supporting your vital signs and your breathing is the first step in  treating an opioid overdose. Treatment may also include:  Giving salts and minerals (electrolytes) along with fluids through an IV.  Inserting a breathing tube (endotracheal tube) in your airway to help you breathe if you cannot breathe on your own or you are in  danger of not being able to breathe on your own.  Giving oxygen through a small tube under your nose.  Passing a tube through your nose and into your stomach (nasogastric tube, or NG tube) to empty your stomach.  Giving medicines that: ? Increase your blood pressure. ? Relieve nausea and vomiting. ? Relieve abdominal pain and cramping. ? Reverse the effects of the opioid (naloxone).  Monitoring your heart and oxygen levels.  Ongoing counseling and mental health support if you intentionally overdosed or used an illegal drug. Follow these instructions at home:  Medicines  Take over-the-counter and prescription medicines only as told by your health care provider.  Always ask your health care provider about possible side effects and interactions of any new medicine that you start taking.  Keep a list of all the medicines that you take, including over-the-counter medicines. Bring this list with you to all your medical visits. General instructions  Drink enough fluid to keep your urine pale yellow.  Keep all follow-up visits as told by your health care provider. This is important. How is this prevented?  Read the drug inserts that come with your opioid pain medicines.  Take medicines only as told by your health care provider. Do not take more medicine than you are told. Do not take medicines more frequently than you are told.  Do not drink alcohol or take sedatives when taking opioids.  Do not use illegal or recreational drugs, including cocaine, ecstasy, and marijuana.  Do not take opioid medicines that are not prescribed for you.  Store all medicines in safety containers that are out of the reach of children.  Get help if you are struggling with: ? Alcohol or drug use. ? Depression or another mental health problem. ? Thoughts of hurting yourself or another person.  Keep the phone number of your local poison control center near your phone or in your mobile phone. In the  [place] the hotline of the National Poison Control Center is (800) 222-1222.  If you were prescribed naloxone, make sure you understand how to take it. Contact a health care provider if you:  Need help understanding how to take your pain medicines.  Feel your medicines are too strong.  Are concerned that your pain medicines are not working well for your pain.  Develop new symptoms or side effects when you are taking medicines. Get help right away if:  You or someone else is having symptoms of an opioid overdose. Get help even if you are not sure.  You have serious thoughts about hurting yourself or others.  You have: ? Chest pain. ? Difficulty breathing. ? A loss of consciousness. These symptoms may represent a serious problem that is an emergency. Do not wait to see if the symptoms will go away. Get medical help right away. Call your local emergency services (911 in the [place] Do not drive yourself to the hospital. If you ever feel like you may hurt yourself or others, or have thoughts about taking your own life, get help right away. You can go to your nearest emergency department or call:  Your local emergency services (911 in the [place]  A suicide crisis helpline, such as the National Suicide Prevention Lifeline at   709-350-4796. This is open 24 hours a day. Summary  Opioids are drugs that are often used to treat pain. Opioids include illegal drugs, such as heroin, as well as prescription pain medicines.  An opioid overdose happens when you take too much of an opioid.  Overdoses can be intentional or accidental.  Opioid overdose is very dangerous. It is a life-threatening emergency.  If you or someone you know is experiencing an opioid overdose, get help right away. This information is not intended to replace advice given to you by your health care provider. Make sure you discuss any questions you have with your health care provider. Document Revised: 04/02/2018 Document  Reviewed: 04/02/2018 [employer] Patient Education  2020 [employer]. ____________________________________________________________________________________________  Medication Evaluation  Purpose: The purpose of these questions is to establish the onset of effects (speed of absorption), peak benefit (effectiveness), side-effects (ability to tolerate), and duration (excretion/metabolism) of the prescribed medication. The results will help the healthcare provider decide what to do with the medication in question.  Please indicate:  1. Time to onset of benefits: The amount of time it takes for you to begin perceiving any benefits after taking (swallowing) your pain medicine. _______ minutes.  2. Time to peak effect: The time it takes between taking medicine (swallowing it) and the moment when you feel the most benefit (peak effect) from the medicine. _______ minutes.  3. Peak benefit: Please quantify the amount of relief you obtain from the medicine at the moment it is working the best. Does you pain completely go away (100% gone)? Is it three quarters (3/4) better (75% relief)? Does half of your pain go away (50% benefit)? Is it a third better (33% improved)? Or does it go down only by a fourth (25% better)? _______ % relief.  4. Duration of benefit: The time it takes for all benefits of the medicine to be gone. This period of time starts when you first swallow your pain pill and ends when you feel that your pain has increased to the point where you absolutely need to take another pill. _______ hours and _______ minutes.  5. Adverse reactions: These are side effects and/or adverse reactions you experience since taking the medicine or only when taking your pain medicine. Please circle any and all that apply:  a. Allergic reactions: itching; hives; generalized redness; swelling of the tongue; swelling of the eyes; difficulty breathing. b. Neurological Intolerance: cognitive impairment (difficulty  thinking clearly; memory problems; difficulty remembering things; slurred speech); oversedation; sleepiness: unsteadiness; difficulty walking. c. Gastrointestinal problems: constipation; nausea; vomiting; dry heaves; decreased appetite. d. Hormonal problems: decreased sex drive; erection problems; abnormal menstrual period; weight gain or inability to lose weight; weakness or low energy; hair loss.  What to do: When completed, please bring back to your next visit. Please give it to the admitting nurse.  (Last updated: 09/10/2018) ____________________________________________________________________________________________   ____________________________________________________________________________________________  Blood Thinners  IMPORTANT NOTICE:  If you take any of these, make sure to notify the nursing staff.  Failure to do so may result in injury.  Recommended time intervals to stop and restart blood-thinners, before & after invasive procedures  Generic Name Brand Name Stop Time. Must be stopped at least this long before procedures. After procedures, wait at least this long before re-starting.  Abciximab Reopro 15 days 2 hrs  Alteplase Activase 10 days 10 days  Anagrelide Agrylin    Apixaban Eliquis 3 days 6 hrs  Cilostazol Pletal 3 days 5 hrs  Clopidogrel Plavix 7-10 days 2  hrs  Dabigatran Pradaxa 5 days 6 hrs  Dalteparin Fragmin 24 hours 4 hrs  Dipyridamole Aggrenox 11days 2 hrs  Edoxaban Lixiana; Savaysa 3 days 2 hrs  Enoxaparin  Lovenox 24 hours 4 hrs  Eptifibatide Integrillin 8 hours 2 hrs  Fondaparinux  Arixtra 72 hours 12 hrs  Prasugrel Effient 7-10 days 6 hrs  Reteplase Retavase 10 days 10 days  Rivaroxaban Xarelto 3 days 6 hrs  Ticagrelor Brilinta 5-7 days 6 hrs  Ticlopidine Ticlid 10-14 days 2 hrs  Tinzaparin Innohep 24 hours 4 hrs  Tirofiban Aggrastat 8 hours 2 hrs  Warfarin Coumadin 5 days 2 hrs   Other medications with blood-thinning effects  Product  indications Generic (Brand) names Note  Cholesterol Lipitor Stop 4 days before procedure  Blood thinner (injectable) Heparin (LMW or LMWH Heparin) Stop 24 hours before procedure  Cancer Ibrutinib (Imbruvica) Stop 7 days before procedure  Malaria/Rheumatoid Hydroxychloroquine (Plaquenil) Stop 11 days before procedure  Thrombolytics  10 days before or after procedures   Over-the-counter (OTC) Products with blood-thinning effects  Product Common names Stop Time  Aspirin > 325 mg Goody Powders, Excedrin, etc. 11 days  Aspirin ? 81 mg  7 days  Fish oil  4 days  Garlic supplements  7 days  Ginkgo biloba  36 hours  Ginseng  24 hours  NSAIDs Ibuprofen, Naprosyn, etc. 3 days  Vitamin E  4 days   ____________________________________________________________________________________________ ____________________________________________________________________________________________  Preparing for Procedure with Sedation  Procedure appointments are limited to planned procedures: . No Prescription Refills. . No disability issues will be discussed. . No medication changes will be discussed.  Instructions: . Oral Intake: Do not eat or drink anything for at least 8 hours prior to your procedure. (Exception: Blood Pressure Medication. See below.) . Transportation: Unless otherwise stated by your physician, you may drive yourself after the procedure. . Blood Pressure Medicine: Do not forget to take your blood pressure medicine with a sip of water the morning of the procedure. If your Diastolic (lower reading)is above 100 mmHg, elective cases will be cancelled/rescheduled. . Blood thinners: These will need to be stopped for procedures. Notify our staff if you are taking any blood thinners. Depending on which one you take, there will be specific instructions on how and when to stop it. . Diabetics on insulin: Notify the staff so that you can be scheduled 1st case in the morning. If your diabetes requires  high dose insulin, take only  of your normal insulin dose the morning of the procedure and notify the staff that you have done so. . Preventing infections: Shower with an antibacterial soap the morning of your procedure. . Build-up your immune system: Take 1000 mg of Vitamin C with every meal (3 times a day) the day prior to your procedure. Marland Kitchen Antibiotics: Inform the staff if you have a condition or reason that requires you to take antibiotics before dental procedures. . Pregnancy: If you are pregnant, call and cancel the procedure. . Sickness: If you have a cold, fever, or any active infections, call and cancel the procedure. . Arrival: You must be in the facility at least 30 minutes prior to your scheduled procedure. . Children: Do not bring children with you. . Dress appropriately: Bring dark clothing that you would not mind if they get stained. . Valuables: Do not bring any jewelry or valuables.  Reasons to call and reschedule or cancel your procedure: (Following these recommendations will minimize the risk of a serious complication.) . Surgeries: Avoid  having procedures within 2 weeks of any surgery. (Avoid for 2 weeks before or after any surgery). . Flu Shots: Avoid having procedures within 2 weeks of a flu shots or . (Avoid for 2 weeks before or after immunizations). . Barium: Avoid having a procedure within 7-10 days after having had a radiological study involving the use of radiological contrast. (Myelograms, Barium swallow or enema study). . Heart attacks: Avoid any elective procedures or surgeries for the initial 6 months after a "Myocardial Infarction" (Heart Attack). . Blood thinners: It is imperative that you stop these medications before procedures. Let us know if you if you take any blood thinner.  . Infection: Avoid procedures during or within two weeks of an infection (including chest colds or gastrointestinal problems). Symptoms associated with infections include: Localized  redness, fever, chills, night sweats or profuse sweating, burning sensation when voiding, cough, congestion, stuffiness, runny nose, sore throat, diarrhea, nausea, vomiting, cold or Flu symptoms, recent or current infections. It is specially important if the infection is over the area that we intend to treat. Marland Kitchen Heart and lung problems: Symptoms that may suggest an active cardiopulmonary problem include: cough, chest pain, breathing difficulties or shortness of breath, dizziness, ankle swelling, uncontrolled high or unusually low blood pressure, and/or palpitations. If you are experiencing any of these symptoms, cancel your procedure and contact your primary care physician for an evaluation.  Remember:  Regular Business hours are:  Monday to Thursday 8:00 AM to 4:00 PM  Provider's Schedule: Milinda Pointer, MD:  Procedure days: Tuesday and Thursday 7:30 AM to 4:00 PM  Gillis Santa, MD:  Procedure days: Monday and Wednesday 7:30 AM to 4:00 PM ____________________________________________________________________________________________

## 2020-05-04 NOTE — Progress Notes (Signed)
PROVIDER NOTE: Information contained herein reflects review and annotations entered in association with encounter. Interpretation of such information and data should be left to medically-trained personnel. Information provided to patient can be located elsewhere in the medical record under "Patient Instructions". Document created using STT-dictation technology, any transcriptional errors that may result from process are unintentional.    Patient: James [name]  Service Category: Procedure  Provider: Oswaldo Done, MD  DOB: [birth year]  DOS: 05/04/2020  Location: [facility]  MRN: [id number]  Setting: Ambulatory - outpatient  Referring Provider: Barbette Reichmann, MD  Type: Established Patient  Specialty: Interventional Pain Management  PCP: Barbette Reichmann, MD   Primary Reason for Visit: Interventional Pain Management Treatment. CC: Abdominal Pain (Bilateral upper quadrant )  Procedure:          Intrathecal Drug Delivery System (IDDS):  Type: Reservoir Refill (31438) 20% rate increase Region: Abdominal Laterality: Left  Type of Pump: Medtronic Synchromed II Delivery Route: Intrathecal Type of Pain Treated: Neuropathic/Nociceptive Primary Medication Class: Opioid/opiate  Medication, Concentration, Infusion Program, & Delivery Rate: Please see scanned programming printout.   Indications: 1. Chronic pain syndrome   2. Chronic epigastric pain (1ry area of Pain)   3. Chronic abdominal pain   4. Cancer-related pain   5. CLL (chronic lymphocytic leukemia) (HCC)   6. Malignant lymphoma, small lymphocytic (HCC)   7. Abdominal wall pain in epigastric region   8. Non-traumatic compression fracture of T2 thoracic vertebra, sequela   9. Non-traumatic compression fracture of T1 thoracic vertebra, sequela   10. Presence of implanted infusion pump (Medtronic)   11. Presence of intrathecal pump (Medtronic)   12. Encounter for adjustment and management of infusion pump   13.  Chronic neuropathic pain   14. Pharmacologic therapy   15. Chronic anticoagulation (Plavix)    Pain Assessment: Self-Reported Pain Score: 5 /10             Reported level is compatible with observation.         If we look at the total amount of medication that this patient is using, it is considerable.  For this reason, I will be scheduling him to return for a bilateral splanchnic nerve alcohol neurolysis.  Previously we had attempted a bilateral celiac plexus neurolysis with phenol, but this was insufficient to control his pain.  The main reason for this is likely to be the fact that his pathology may be residing higher than what it covered by the celiac plexus.  For this reason, I will be going to the area of the splanchnic nerves in an attempt to get this pain under better control.  The other thing that I am considering is the possibility that the patient's intrathecal catheter may have migrated into the epidural space which would then require 10 times more medicine to achieve the same type of effect if it was intrathecal.  For this reason, when he comes in for the splanchnic neurolytic block, I will also access his pump using the side-port and aspirate to see if I can obtain some CSF.  If I cannot, this would be suggestive of the possibility that it may not be intrathecal.  At that point, what I would end up doing is removing the medicine from the catheter and injecting a small amount of contrast to see if we can identify again if its epidural or intrathecal.  If the catheter ends up being epidural, we will need to send the patient back to the surgeon  for revision and replacement.  Pharmacotherapy Assessment  Analgesic: Oxycodone IR $RemoveBefo'10mg'wGgZdDTFFlV$ , 1 tab PO q 4 hrs (60 mg/day of oxycodone IR) (90 MME) + morphine ER 30 mg twice daily (60 mg/day of morphine ER) (60 MME) (150 MME/day). Highest recorded MME/day: 150 mg/day 90 day average MME/day: 77.89 mg/day   Monitoring: [place] PMP: PDMP reviewed during this  encounter.       Pharmacotherapy: No side-effects or adverse reactions reported. Compliance: No problems identified. Effectiveness: Clinically acceptable. Plan: Refer to "POC".  UDS: No results found for: SUMMARY  Intrathecal Pump Therapy Assessment  Manufacturer: Medtronic Synchromed Type: Programmable Volume: 40 mL reservoir MRI compatibility: Yes   Drug content: Primary Medication Class:Opioid Primary Medication:PF-Fentanyl(2000 mcg/mL) Secondary Medication:PF-Bupivacaine(20 mg/mL) Other Medication:None  PTM(PCA) mode: PTM dose:25 mcg bolus Duration of bolus:1 minute Lockout interval:4hours Maximum 24-hour doses:6  Programming:  Type: Simple continuous with PCA. See pump readout for details.   Changes:  Medication Change: None at this point Rate Change: 20 % increase. Today we have reviewed the programming on his intrathecal pump and he has been using his PCA mode to the maximum meaning that he had a total of 1682.9 mcg/day, and he is still complaining of insufficient analgesia. To this you have to add that he has also continued to take Oxycodone IR 10 mg q4hrs for breakthrough pain.  For this reason, today we have increased his basal rate to 1,846 mcg/day. PCA mode will be kept the same with allowed activations of 6/day (1 every 4 hours). He should be able to get a maximum of 1,988.7 mcg/day of fentanyl and 19.887 mg/day of bupivacaine, if he continues to use the PCA mode to its maximum. Otherwise, he would get a basal rate of 1,846.4 mcg/day of Fentanyl and 18.464 mg/day of Bupivacaine. The concentration and dose of fentanyl and bupivacaine that he can get per dose will remain the same as before.  Reported side-effects or adverse reactions: None reported  Effectiveness: Described as relatively effective, allowing for increase in activities of daily living (ADL) Clinically meaningful improvement in function (CMIF): Sustained CMIF goals met  Plan: Pump refill  today  Pre-op H&P Assessment:  James [name] is a 70 y.o. (year old), male patient, seen today for interventional treatment. He  has a past surgical history that includes Cholecystectomy (1983); Esophagogastroduodenoscopy (egd) with propofol (N/A, 11/20/2016); Cataract extraction w/ intraocular lens implant (Bilateral); Lower Extremity Angiography (Right, 05/19/2017); Esophagogastroduodenoscopy (egd) with propofol (N/A, 10/27/2017); Colonoscopy with propofol (N/A, 10/27/2017); Lower Extremity Angiography (Left, 08/10/2018); and Intrathecal pump implant (Left, 02/07/2020). James [name] has a current medication list which includes the following prescription(s): atorvastatin, clopidogrel, ensure, folic acid, gabapentin, metformin, methocarbamol, naloxegol oxalate, naloxone, [START ON 05/18/2020] PAIN MANAGEMENT IT PUMP REFILL, pantoprazole, and [START ON 05/12/2020] oxycodone hcl. His primarily concern today is the Abdominal Pain (Bilateral upper quadrant )  Initial Vital Signs:  Pulse/HCG Rate: 80  Temp: (!) 97.5 F (36.4 C) Resp: 16 BP: 126/71 SpO2: 100 %  BMI: Estimated body mass index is 22.81 kg/m as calculated from the following:   Height as of this encounter: $RemoveBeforeD'5\' 8"'uXTLlseUMLAWco$  (1.727 m).   Weight as of this encounter: 150 lb (68 kg).  Risk Assessment: Allergies: Reviewed. He has No Known Allergies.  Allergy Precautions: None required Coagulopathies: Reviewed. None identified.  Blood-thinner therapy: None at this time Active Infection(s): Reviewed. None identified. James [name] is afebrile  Site Confirmation: James [name] was asked to confirm the procedure and laterality before marking the site Procedure checklist: Completed Consent:  Before the procedure and under the influence of no sedative(s), amnesic(s), or anxiolytics, the patient was informed of the treatment options, risks and possible complications. To fulfill our ethical and legal obligations, as recommended by the American Medical Association's Code of Ethics,  I have informed the patient of my clinical impression; the nature and purpose of the treatment or procedure; the risks, benefits, and possible complications of the intervention; the alternatives, including doing nothing; the risk(s) and benefit(s) of the alternative treatment(s) or procedure(s); and the risk(s) and benefit(s) of doing nothing.  James [name] was provided with information about the general risks and possible complications associated with most interventional procedures. These include, but are not limited to: failure to achieve desired goals, infection, bleeding, organ or nerve damage, allergic reactions, paralysis, and/or death.  In addition, he was informed of those risks and possible complications associated to this particular procedure, which include, but are not limited to: damage to the implant; failure to decrease pain; local, systemic, or serious CNS infections, intraspinal abscess with possible cord compression and paralysis, or life-threatening such as meningitis; bleeding; organ damage; nerve injury or damage with subsequent sensory, motor, and/or autonomic system dysfunction, resulting in transient or permanent pain, numbness, and/or weakness of one or several areas of the body; allergic reactions, either minor or major life-threatening, such as anaphylactic or anaphylactoid reactions.  Furthermore, James [name] was informed of those risks and complications associated with the medications. These include, but are not limited to: allergic reactions (i.e.: anaphylactic or anaphylactoid reactions); endorphine suppression; bradycardia and/or hypotension; water retention and/or peripheral vascular relaxation leading to lower extremity edema and possible stasis ulcers; respiratory depression and/or shortness of breath; decreased metabolic rate leading to weight gain; swelling or edema; medication-induced neural toxicity; particulate matter embolism and blood vessel occlusion with resultant organ,  and/or nervous system infarction; and/or intrathecal granuloma formation with possible spinal cord compression and permanent paralysis.  Before refilling the pump James [name] was informed that some of the medications used in the devise may not be FDA approved for such use and therefore it constitutes an off-label use of the medications.  Finally, he was informed that Medicine is not an exact science; therefore, there is also the possibility of unforeseen or unpredictable risks and/or possible complications that may result in a catastrophic outcome. The patient indicated having understood very clearly. We have given the patient no guarantees and we have made no promises. Enough time was given to the patient to ask questions, all of which were answered to the patient's satisfaction. James [name] has indicated that he wanted to continue with the procedure. Attestation: I, the ordering provider, attest that I have discussed with the patient the benefits, risks, side-effects, alternatives, likelihood of achieving goals, and potential problems during recovery for the procedure that I have provided informed consent. Date  Time: 05/04/2020  1:15 PM  Pre-Procedure Preparation:  Monitoring: As per clinic protocol. Respiration, ETCO2, SpO2, BP, heart rate and rhythm monitor placed and checked for adequate function Safety Precautions: Patient was assessed for positional comfort and pressure points before starting the procedure. Time-out: I initiated and conducted the "Time-out" before starting the procedure, as per protocol. The patient was asked to participate by confirming the accuracy of the "Time Out" information. Verification of the correct person, site, and procedure were performed and confirmed by me, the nursing staff, and the patient. "Time-out" conducted as per Joint Commission's Universal Protocol (UP.01.01.01). Time: 1324  Description of Procedure:  Position: Supine Target Area: Central-port of  intrathecal pump. Approach: Anterior, 90 degree angle approach. Area Prepped: Entire Area around the pump implant. DuraPrep (Iodine Povacrylex [0.7% available iodine] and Isopropyl Alcohol, 74% w/w) Safety Precautions: Aspiration looking for blood return was conducted prior to all injections. At no point did we inject any substances, as a needle was being advanced. No attempts were made at seeking any paresthesias. Safe injection practices and needle disposal techniques used. Medications properly checked for expiration dates. SDV (single dose vial) medications used. Description of the Procedure: Protocol guidelines were followed. Two nurses trained to do implant refills were present during the entire procedure. The refill medication was checked by both healthcare providers as well as the patient. The patient was included in the "Time-out" to verify the medication. The patient was placed in position. The pump was identified. The area was prepped in the usual manner. The sterile template was positioned over the pump, making sure the side-port location matched that of the pump. Both, the pump and the template were held for stability. The needle provided in the Medtronic Kit was then introduced thru the center of the template and into the central port. The pump content was aspirated and discarded volume documented. The new medication was slowly infused into the pump, thru the filter, making sure to avoid overpressure of the device. The needle was then removed and the area cleansed, making sure to leave some of the prepping solution back to take advantage of its long term bactericidal properties. The pump was interrogated and programmed to reflect the correct medication, volume, and dosage. The program was printed and taken to the physician for approval. Once checked and signed by the physician, a copy was provided to the patient and another scanned into the EMR. Vitals:   05/04/20 1314  BP: 126/71  Pulse: 80   Resp: 16  Temp: (!) 97.5 F (36.4 C)  TempSrc: Temporal  SpO2: 100%  Weight: 150 lb (68 kg)  Height: $Remove'5\' 8"'lkfBMRW$  (1.727 m)    Start Time: 1324 hrs. End Time: 1341 hrs. Materials & Medications: Medtronic Refill Kit Medication(s): Please see chart orders for details.  Imaging Guidance:          Type of Imaging Technique: None used Indication(s): N/A Exposure Time: No patient exposure Contrast: None used. Fluoroscopic Guidance: N/A Ultrasound Guidance: N/A Interpretation: N/A  Antibiotic Prophylaxis:   Anti-infectives (From admission, onward)   None     Indication(s): None identified  Post-operative Assessment:  Post-procedure Vital Signs:  Pulse/HCG Rate: 80  Temp: (!) 97.5 F (36.4 C) Resp: 16 BP: 126/71 SpO2: 100 %  EBL: None  Complications: No immediate post-treatment complications observed by team, or reported by patient.  Note: The patient tolerated the entire procedure well. A repeat set of vitals were taken after the procedure and the patient was kept under observation following institutional policy, for this type of procedure. Post-procedural neurological assessment was performed, showing return to baseline, prior to discharge. The patient was provided with post-procedure discharge instructions, including a section on how to identify potential problems. Should any problems arise concerning this procedure, the patient was given instructions to immediately contact us, at any time, without hesitation. In any case, we plan to contact the patient by telephone for a follow-up status report regarding this interventional procedure.  Comments:  No additional relevant information.  Plan of Care  Orders:  Orders Placed This Encounter  Procedures  . Splachnic Nerve Neurolysis    Standing Status:  Future    Standing Expiration Date:   07/02/2020    Scheduling Instructions:     Bilateral alcohol neurolysis of the splanchnic nerves.    Order Specific Question:   Where will  this procedure be performed?    Answer:   [facility]  . PUMP REFILL    Maintain Protocol by having two(2) healthcare providers during procedure and programming.    Scheduling Instructions:     Please refill intrathecal pump today.    Order Specific Question:   Where will this procedure be performed?    Answer:   [facility]  . PUMP REFILL    Whenever possible schedule on a procedure today.    Standing Status:   Future    Standing Expiration Date:   10/01/2020    Scheduling Instructions:     Please schedule intrathecal pump refill based on pump programming. Avoid schedule intervals of more than 120 days (4 months).    Order Specific Question:   Where will this procedure be performed?    Answer:   [facility]  . PUMP REPROGRAM    Follow programming protocol by having two(2) healthcare providers present during programming.    Scheduling Instructions:     Please perform the following adjustment: Increase rate by 20%.    Order Specific Question:   Where will this procedure be performed?    Answer:   [facility]  . Blood Thinner Instructions to Nursing    Always make sure patient has clearance from prescribing physician to stop blood thinners for interventional therapies. If the patient requires a Lovenox-bridge therapy, make sure arrangements are made to institute it with the assistance of the PCP.    Scheduling Instructions:     Have James [name] stop the Plavix (Clopidogrel) x 7-10 days prior to procedure or surgery.  . Informed Consent Details: Physician/Practitioner Attestation; Transcribe to consent form and obtain patient signature    Transcribe to consent form and obtain patient signature.    Order Specific Question:   Physician/Practitioner attestation of informed consent for procedure/surgical case    Answer:   I, the physician/practitioner, attest that I have discussed with the patient the benefits, risks, side effects, alternatives, likelihood of  achieving goals and potential problems during recovery for the procedure that I have provided informed consent.    Order Specific Question:   Procedure    Answer:   Intrathecal pump refill    Order Specific Question:   Physician/Practitioner performing the procedure    Answer:   Attending Physician: Kathlen Brunswick. Dossie Arbour, MD & designated trained staff    Order Specific Question:   Indication/Reason    Answer:   Chronic Pain Syndrome (G89.4), presence of an intrathecal pump (Z97.8)   Chronic Opioid Analgesic:  Oxycodone IR $RemoveBefo'10mg'MYGPgjdzIFc$ , 1 tab PO q 4 hrs (60 mg/day of oxycodone IR) (90 MME) + morphine ER 30 mg twice daily (60 mg/day of morphine ER) (60 MME) (150 MME/day). Highest recorded MME/day: 150 mg/day 90 day average MME/day: 77.89 mg/day   Medications ordered for procedure: Meds ordered this encounter  Medications  . Oxycodone HCl 10 MG TABS    Sig: Take 1 tablet (10 mg total) by mouth every 4 (four) hours as needed. Maximum of 6/day.    Dispense:  180 tablet    Refill:  0    Chronic Pain: STOP Act (Not applicable) Fill 1 day early if closed on refill date. Avoid benzodiazepines within 8 hours of opioids  Medications administered: Jarryn F. Macapagal had no medications administered during this visit.  See the medical record for exact dosing, route, and time of administration.  Follow-up plan:   Return for Procedure (w/ sedation): (B) Splachnic Nerves Alcohol Neurolysis #1, (Blood Thinner Protocol).      Interventional Therapies  Risk  Complexity Considerations:   NOTE: PLAVIX ANTICOAGULATION  (Stop: 7 days  Restart: 2 hrs)   Planned  Pending:   Therapeutic bilateral splanchnic nerves neurolysis #1 with alcohol  Diagnostic intrathecal catheter test under fluoroscopic guidance to determine location    Under consideration:   Possible revision of intrathecal catheter    Completed:   Diagnostic bilateral celiac plexus block x2 (06/01/2019; 11/02/2019) (50/30/30/0) (75/75/0/0)   Therapeutic bilateral celiac plexus neurolysis with phenol x1 (04/06/2020)  Diagnostic bilateral rectus abdominis trigger point injection x1 (04/06/2020) (0/0/0)  Diagnostic (ML) T12-L1 intrathecal injection of PF-fentanyl 25 mcg as an intrathecal pump trial x1 (12/14/2019)   Therapeutic  Palliative (PRN) options:   Intrathecal pump management    Recent Visits Date Type Provider Dept  05/01/20 Telemedicine Delano Metz, MD [facility]  04/18/20 Office Visit Delano Metz, MD [facility]  04/11/20 Office Visit Delano Metz, MD [facility]  04/06/20 Procedure visit Delano Metz, MD [facility]  03/29/20 Procedure visit Delano Metz, MD [facility]  03/27/20 Procedure visit Delano Metz, MD [facility]  03/21/20 Procedure visit Delano Metz, MD [facility]  03/13/20 Procedure visit Delano Metz, MD [facility]  03/09/20 Procedure visit Delano Metz, MD [facility]  03/06/20 Procedure visit Delano Metz, MD [facility]  Showing recent visits within past 90 days and meeting all other requirements Today's Visits Date Type Provider Dept  05/04/20 Procedure visit Delano Metz, MD [facility]  Showing today's visits and meeting all other requirements Future Appointments Date Type Provider Dept  05/11/20 Appointment Delano Metz, MD [facility]  06/01/20 Appointment Delano Metz, MD [facility]  Showing future appointments within next 90 days and meeting all other requirements  Disposition: Discharge home  Discharge (Date  Time): 05/04/2020; 1437 hrs.   Primary Care Physician: Barbette Reichmann, MD Location: [facility] Note by: Oswaldo Done, MD Date: 05/04/2020; Time: 3:03 PM  Disclaimer:  Medicine is not an Visual merchandiser. The only guarantee in  medicine is that nothing is guaranteed. It is important to note that the decision to proceed with this intervention was based on the information collected from the patient. The Data and conclusions were drawn from the patient's questionnaire, the interview, and the physical examination. Because the information was provided in large part by the patient, it cannot be guaranteed that it has not been purposely or unconsciously manipulated. Every effort has been made to obtain as much relevant data as possible for this evaluation. It is important to note that the conclusions that lead to this procedure are derived in large part from the available data. Always take into account that the treatment will also be dependent on availability of resources and existing treatment guidelines, considered by other Pain Management Practitioners as being common knowledge and practice, at the time of the intervention. For Medico-Legal purposes, it is also important to point out that variation in procedural techniques and pharmacological choices are the acceptable norm. The indications, contraindications, technique, and results of the above procedure should only be interpreted and judged by a Board-Certified Interventional Pain Specialist with extensive familiarity and expertise  in the same exact procedure and technique.

## 2020-05-04 NOTE — Progress Notes (Signed)
Safety precautions to be maintained throughout the outpatient stay will include: orient to surroundings, keep bed in low position, maintain call bell within reach at all times, provide assistance with transfer out of bed and ambulation.  

## 2020-05-05 ENCOUNTER — Telehealth: Payer: Self-pay | Admitting: *Deleted

## 2020-05-05 NOTE — Telephone Encounter (Signed)
No problems post IT pump fill. 

## 2020-05-08 ENCOUNTER — Other Ambulatory Visit: Payer: Self-pay

## 2020-05-08 MED ORDER — PAIN MANAGEMENT IT PUMP REFILL: 1.0000 | Freq: Once | INTRATHECAL | 0 refills | Status: AC

## 2020-05-09 ENCOUNTER — Inpatient Hospital Stay: Payer: Medicare Other | Attending: Internal Medicine

## 2020-05-09 ENCOUNTER — Telehealth: Payer: Self-pay | Admitting: *Deleted

## 2020-05-09 ENCOUNTER — Inpatient Hospital Stay: Payer: Medicare Other

## 2020-05-09 VITALS — BP 151/85 | HR 99

## 2020-05-09 DIAGNOSIS — D631 Anemia in chronic kidney disease: Secondary | ICD-10-CM

## 2020-05-09 DIAGNOSIS — N183 Chronic kidney disease, stage 3 unspecified: Secondary | ICD-10-CM

## 2020-05-09 DIAGNOSIS — N189 Chronic kidney disease, unspecified: Secondary | ICD-10-CM | POA: Diagnosis present

## 2020-05-09 DIAGNOSIS — C9110 Chronic lymphocytic leukemia of B-cell type not having achieved remission: Secondary | ICD-10-CM

## 2020-05-09 LAB — HEMATOCRIT: HCT: 27.2 % — ABNORMAL LOW (ref 39.0–52.0)

## 2020-05-09 LAB — HEMOGLOBIN: Hemoglobin: 8.5 g/dL — ABNORMAL LOW (ref 13.0–17.0)

## 2020-05-09 MED ORDER — METFORMIN HCL 1000 MG PO TABS: 1000.0000 mg | ORAL_TABLET | Freq: Every day | ORAL | 3 refills | Status: DC

## 2020-05-09 MED ORDER — DARBEPOETIN ALFA 300 MCG/0.6ML IJ SOSY
300.0000 ug | PREFILLED_SYRINGE | Freq: Once | INTRAMUSCULAR | Status: AC
  Administered 2020-05-09: 300 ug via SUBCUTANEOUS
  Filled 2020-05-09: qty 0.6

## 2020-05-09 NOTE — Telephone Encounter (Signed)
Patient requesting Dr. B to fill his metformin. He has not been to his pcp and needs RF. Pt is out of med. RN Spoke with Dr. Rogue Bussing, who agreed to Rf metformin. Script sent to patient's pharmacy.

## 2020-05-11 ENCOUNTER — Ambulatory Visit: Payer: Medicare Other | Admitting: Pain Medicine

## 2020-05-11 ENCOUNTER — Other Ambulatory Visit: Payer: Self-pay | Admitting: Pain Medicine

## 2020-05-11 ENCOUNTER — Encounter: Payer: Medicare Other | Admitting: Pain Medicine

## 2020-05-11 MED ORDER — ALCOHOL 98 % IJ SOLN
10.0000 mL | Freq: Once | INTRAMUSCULAR | Status: DC
  Filled 2020-05-11: qty 10

## 2020-05-11 MED FILL — Medication: INTRATHECAL | Qty: 1 | Status: AC

## 2020-05-11 NOTE — Progress Notes (Deleted)
The patient called to cancel the procedure.  He called from the ED.  Apparently he had to take his wife to the emergency room due to her being disoriented.

## 2020-05-11 NOTE — Progress Notes (Signed)
The patient called today to cancel the procedure we had scheduled for today.Marland Kitchen  He called from the ED.  Apparently he had to take his wife to the emergency room due to her being disoriented.  We have been having difficulty controlling his pain and therefore the plan for today was as follows:  Because we have been increasing the infusion rate of the pump without really seeing any significant benefit, I believe that they intrathecal catheter might have migrated into the epidural space.  If this is the case, I would take 10X more medication to get the same type of result.  Therefore, my plan was to bring the patient in and aspirate through the side-port of the intrathecal pump to determine if we could get some CSF.  If there was no CSF present, this would be strong evidence that the catheter has migrated in which case we may have to send him to have the catheter revised and put into the intrathecal space again.  At the same time I had planned to do a bilateral splanchnic nerve alcohol neurolysis to provide him with better relief of the pain.  Previously I had done a bilateral neurolytic celiac plexus block using phenol.  However, the patient indicated that it did not provide him with long-term benefit.  There are 2 possible reasons for this, the first 1 is that 6% phenol does tend to have a significant local anesthetic effect where it will wear off.  Therefore the plan was to change the neurolytic agent to alcohol.  The second possibility is that he has significant contributions to the pain coming from above the diaphragm and therefore the celiac plexus block would not cover pain coming from the lower esophageal region.  For this reason I was planning on doing a splanchnic nerve neurolysis in order to cover that pain.  However, because that area is highly vascular, my plan was to perform the procedure using radiofrequency needles so that if doing the procedure, at the time that we injected contrast we observed  vascular uptake, then I would change then neurolytic technique to a radiofrequency ablation.  I had previously discussed this plan with the patient who indicated understanding and agreeing.

## 2020-05-16 ENCOUNTER — Inpatient Hospital Stay (HOSPITAL_BASED_OUTPATIENT_CLINIC_OR_DEPARTMENT_OTHER): Payer: Medicare Other | Admitting: Hospice and Palliative Medicine

## 2020-05-16 DIAGNOSIS — C9110 Chronic lymphocytic leukemia of B-cell type not having achieved remission: Secondary | ICD-10-CM

## 2020-05-16 NOTE — Progress Notes (Signed)
I was unable to reach patient for scheduled MyChart visit.  Will reschedule. 

## 2020-05-19 ENCOUNTER — Other Ambulatory Visit: Payer: Self-pay

## 2020-05-19 DIAGNOSIS — C9110 Chronic lymphocytic leukemia of B-cell type not having achieved remission: Secondary | ICD-10-CM

## 2020-05-23 ENCOUNTER — Inpatient Hospital Stay: Payer: Medicare Other

## 2020-05-23 ENCOUNTER — Inpatient Hospital Stay (HOSPITAL_BASED_OUTPATIENT_CLINIC_OR_DEPARTMENT_OTHER): Payer: Medicare Other | Admitting: Internal Medicine

## 2020-05-23 ENCOUNTER — Encounter: Payer: Medicare Other | Admitting: Pain Medicine

## 2020-05-23 ENCOUNTER — Encounter: Payer: Self-pay | Admitting: Internal Medicine

## 2020-05-23 DIAGNOSIS — N183 Chronic kidney disease, stage 3 unspecified: Secondary | ICD-10-CM

## 2020-05-23 DIAGNOSIS — C9110 Chronic lymphocytic leukemia of B-cell type not having achieved remission: Secondary | ICD-10-CM

## 2020-05-23 DIAGNOSIS — N189 Chronic kidney disease, unspecified: Secondary | ICD-10-CM | POA: Diagnosis not present

## 2020-05-23 LAB — CBC WITH DIFFERENTIAL/PLATELET
Abs Immature Granulocytes: 0.01 10*3/uL (ref 0.00–0.07)
Basophils Absolute: 0.1 10*3/uL (ref 0.0–0.1)
Basophils Relative: 2 %
Eosinophils Absolute: 0.1 10*3/uL (ref 0.0–0.5)
Eosinophils Relative: 1 %
HCT: 26.1 % — ABNORMAL LOW (ref 39.0–52.0)
Hemoglobin: 8.4 g/dL — ABNORMAL LOW (ref 13.0–17.0)
Immature Granulocytes: 0 %
Lymphocytes Relative: 61 %
Lymphs Abs: 3.8 10*3/uL (ref 0.7–4.0)
MCH: 34.7 pg — ABNORMAL HIGH (ref 26.0–34.0)
MCHC: 32.2 g/dL (ref 30.0–36.0)
MCV: 107.9 fL — ABNORMAL HIGH (ref 80.0–100.0)
Monocytes Absolute: 0.6 10*3/uL (ref 0.1–1.0)
Monocytes Relative: 10 %
Neutro Abs: 1.6 10*3/uL — ABNORMAL LOW (ref 1.7–7.7)
Neutrophils Relative %: 26 %
Platelets: 168 10*3/uL (ref 150–400)
RBC: 2.42 MIL/uL — ABNORMAL LOW (ref 4.22–5.81)
RDW: 16.6 % — ABNORMAL HIGH (ref 11.5–15.5)
Smear Review: NORMAL
WBC Morphology: ABNORMAL
WBC: 6.1 10*3/uL (ref 4.0–10.5)
nRBC: 0 % (ref 0.0–0.2)

## 2020-05-23 LAB — SAMPLE TO BLOOD BANK

## 2020-05-23 LAB — COMPREHENSIVE METABOLIC PANEL
ALT: 10 U/L (ref 0–44)
AST: 15 U/L (ref 15–41)
Albumin: 5 g/dL (ref 3.5–5.0)
Alkaline Phosphatase: 59 U/L (ref 38–126)
Anion gap: 11 (ref 5–15)
BUN: 27 mg/dL — ABNORMAL HIGH (ref 8–23)
CO2: 26 mmol/L (ref 22–32)
Calcium: 9.3 mg/dL (ref 8.9–10.3)
Chloride: 101 mmol/L (ref 98–111)
Creatinine, Ser: 1.22 mg/dL (ref 0.61–1.24)
GFR, Estimated: >60 mL/min (ref >60)
Glucose, Bld: 142 mg/dL — ABNORMAL HIGH (ref 70–99)
Potassium: 5.1 mmol/L (ref 3.5–5.1)
Sodium: 138 mmol/L (ref 135–145)
Total Bilirubin: 0.9 mg/dL (ref 0.3–1.2)
Total Protein: 7 g/dL (ref 6.5–8.1)

## 2020-05-23 LAB — LACTATE DEHYDROGENASE: LDH: 109 U/L (ref 98–192)

## 2020-05-23 MED ORDER — DARBEPOETIN ALFA 300 MCG/0.6ML IJ SOSY
300.0000 ug | PREFILLED_SYRINGE | Freq: Once | INTRAMUSCULAR | Status: AC
  Administered 2020-05-23: 300 ug via SUBCUTANEOUS
  Filled 2020-05-23: qty 0.6

## 2020-05-23 NOTE — Progress Notes (Signed)
Should there [facility] [place] OFFICE PROGRESS NOTE  Patient Care Team: [name], MD as PCP - General (Internal Medicine) Cammie Sickle, MD as Consulting Physician (Hematology and Oncology) Borders, Kirt Boys, NP as Nurse Practitioner (Hospice and Palliative Medicine) Verlon Au, NP as Nurse Practitioner (Hematology and Oncology) Jacquelin Hawking, NP as Nurse Practitioner (Oncology) Lonell Face, NP as Nurse Practitioner (Neurosurgery) Milinda Pointer, MD as Consulting Physician (Pain Medicine)  Cancer Staging No matching staging information was found for the patient.   Oncology History Overview Note  # 2006- CLL STAGE IV; MAY 2011- WBC- 57K;Platelets-99;Hb-12/CT Bulky LN; BMBx- 80% Invol; del 11; START Benda-Ritux x4 [finished Sep 2011];   # July 2015-Progression; Sep 2015-START ibrutinib; CT scan DEC 2015- Improvement LN; Cont Ibrutinib 169m/d; NOV 2016 CT- 1-2CM LN [mild progression compared to Dec 2015];NOV 2016- FISH peripheral blood- NO MUTATIONS/CD-38 Positive; NOV 7th- CONT IBRUTINIB 2 pills/day; CT AUG 2017- STABLE;  DEC 6th PET- Mild RP LN/ Retrocrural LN  # OFF ibrutinib [? intol]- sep 2019- Jan 16th 2020; Re-start Ibrutinib; September 2020-stop ibrutinib [poor tolerance/worsening anemia]  # Jan 16th 2020- start [place]p; [place]   # MARCH 11th 2021-[name]  # SEP 2021- prostate enlargement on CT scan- UA-NEG; PSA- WNL; s/p Dr.Wolff.    # DEC 2019- PAIN CONTRACT  # PALLIATIVE CARE- 06/21/2019-  # October 2019-bone marrow biopsy [worsening anemia]-question dyserythropoietic changes/small clone of CLL;  # [facility] HEM- NEG.  SA skin infection [Oct 2016] s/p clinda -------------------------------------------------------    DIAGNOSIS: CLL  STAGE:   IV      ;GOALS: palliative  CURRENT/MOST RECENT THERAPY : Gazyva [C]   C[place] lymphocytic [place]) [place]  07/08/2019 -  Chemotherapy   The patient had  obinutuzumab (GAZYVA) 100 mg in sodium chloride 0.9 % 100 mL (0.9615 mg/mL) chemo infusion, 100 mg, Intravenous, Once, 6 of 6 cycles Administration: 100 mg (07/08/2019), 900 mg (07/09/2019), 1,000 mg (07/26/2019), 1,000 mg (08/16/2019), 1,000 mg (08/02/2019), 1,000 mg (09/13/2019), 1,000 mg (10/11/2019), 1,000 mg (11/09/2019), 1,000 mg (12/07/2019)  for chemotherapy treatment.      INTERVAL HISTORY:  James DENNARD89y.[id number].  male pleasant patient above history of CLL and chronic abdominal pain of unclear etiology; anemia- ckd/hyperkalemia currently  is here for follow-up.  Patient is currently on surveillance for his CLL.  Patient continues to follow-up with pain clinic for his chronic abdominal pain.  States his pain is improved and not resolved.  Is currently undergoing titration of his pain medication through pain pump.  Denies any worsening weight loss but no nausea no vomiting.  No swelling the legs.  No fevers or chills.  Review of Systems  Constitutional: Positive for malaise/fatigue. Negative for chills, diaphoresis and fever.  HENT: Negative for nosebleeds and sore throat.   Eyes: Negative for double vision.  Respiratory: Positive for shortness of breath. Negative for cough, hemoptysis, sputum production and wheezing.   Cardiovascular: Negative for chest pain, palpitations, orthopnea and leg swelling.  Gastrointestinal: Positive for abdominal pain, constipation and nausea. Negative for blood in stool, diarrhea, heartburn, melena and vomiting.  Genitourinary: Negative for dysuria, frequency and urgency.  Skin: Negative.  Negative for itching and rash.  Neurological: Negative for dizziness, tingling, focal weakness, weakness and headaches.  Endo/Heme/Allergies: Does not bruise/bleed easily.  Psychiatric/Behavioral: Negative for depression. The patient is not nervous/anxious and does not have insomnia.       PAST MEDICAL HISTORY :  Past Medical History:  Diagnosis Date  . CLL (chronic  lymphocytic leukemia) ([place])   . Constipation   . Depression   . Diabetes mellitus without complication ([place])   . GERD (gastroesophageal reflux disease)   . Hematuria   . Hyperlipidemia   . Hypertension   . Therapeutic opioid induced constipation     PAST SURGICAL HISTORY :   Past Surgical History:  Procedure Laterality Date  . CATARACT EXTRACTION W/ INTRAOCULAR LENS IMPLANT Bilateral   . CHOLECYSTECTOMY  1983  . COLONOSCOPY WITH PROPOFOL N/A 10/27/2017   Procedure: COLONOSCOPY WITH PROPOFOL;  Surgeon: Manya Silvas, MD;  Location[facility] ENDOSCOPY;  Service: Endoscopy;  Laterality: N/A;  . ESOPHAGOGASTRODUODENOSCOPY (EGD) WITH PROPOFOL N/A 11/20/2016   Procedure: ESOPHAGOGASTRODUODENOSCOPY (EGD) WITH PROPOFOL;  Surgeon: Manya Silvas, MD;  Location: [facility] ENDOSCOPY;  Service: Endoscopy;  Laterality: N/A;  . ESOPHAGOGASTRODUODENOSCOPY (EGD) WITH PROPOFOL N/A 10/27/2017   Procedure: ESOPHAGOGASTRODUODENOSCOPY (EGD) WITH PROPOFOL;  Surgeon: Manya Silvas, MD;  Location: [facility] ENDOSCOPY;  Service: Endoscopy;  Laterality: N/A;  . INTRATHECAL PUMP IMPLANT Left 02/07/2020   Procedure: INTRATHECAL PUMP & CATHETER IMPLANT;  Surgeon: Deetta Perla, MD;  Location: [facility] ORS;  Service: Neurosurgery;  Laterality: Left;  . LOWER EXTREMITY ANGIOGRAPHY Right 05/19/2017   Procedure: LOWER EXTREMITY ANGIOGRAPHY;  Surgeon: Algernon Huxley, MD;  Location: [place] CV LAB;  Service: Cardiovascular;  Laterality: Right;  . LOWER EXTREMITY ANGIOGRAPHY Left 08/10/2018   Procedure: LOWER EXTREMITY ANGIOGRAPHY;  Surgeon: Algernon Huxley, MD;  Location: [place] CV LAB;  Service: Cardiovascular;  Laterality: Left;    FAMILY HISTORY :   Family History  Problem Relation Age of Onset  . Hypertension Sister     SOCIAL HISTORY:   Social History   Tobacco Use  . Smoking status: Current Every Day Smoker    Packs/day: 0.50    Years: 47.00    Pack years: 23.50    Types: Cigarettes  . Smokeless tobacco:  Never Used  Vaping Use  . Vaping Use: Never used  Substance Use Topics  . Alcohol use: Yes    Alcohol/week: 1.0 standard drink    Types: 1 Glasses of wine per week    Comment:  almost none in last 6 months  . Drug use: No    ALLERGIES:  has No Known Allergies.  MEDICATIONS:  Current Outpatient Medications  Medication Sig Dispense Refill  . atorvastatin (LIPITOR) 10 MG tablet Take 1 tablet (10 mg total) by mouth daily. 30 tablet 11  . clopidogrel (PLAVIX) 75 MG tablet Take 75 mg by mouth daily.     . Ensure (ENSURE) Take 237 mLs by mouth 3 (three) times daily between meals.     . folic acid (FOLVITE) 1 MG tablet Take 1 tablet (1 mg total) by mouth daily. 90 tablet 1  . metFORMIN (GLUCOPHAGE) 1000 MG tablet Take 1 tablet (1,000 mg total) by mouth daily with breakfast. 30 tablet 3  . methocarbamol (ROBAXIN) 500 MG tablet Take 1 tablet (500 mg total) by mouth every 6 (six) hours as needed for muscle spasms. 80 tablet 0  . naloxegol oxalate (MOVANTIK) 25 MG TABS tablet Take 1 tablet (25 mg total) by mouth daily. 30 tablet 0  . naloxone (NARCAN) 2 MG/2ML injection Inject 1 mL (1 mg total) into the muscle as needed for up to 2 doses (for opioid overdose). In case of emergency (overdose), inject into muscle of upper arm or leg and call 911. 2 mL 0  . Oxycodone HCl 10  MG TABS Take 1 tablet (10 mg total) by mouth every 4 (four) hours as needed. Maximum of 6/day. 180 tablet 0  . pantoprazole (PROTONIX) 40 MG tablet Take 40 mg by mouth at bedtime.    . gabapentin (NEURONTIN) 300 MG capsule Take 3 capsules (900 mg total) by mouth at bedtime AND 1 capsule (300 mg total) 2 (two) times daily. Do all this for 15 days. Follow titration schedule.. 75 capsule 0   No current facility-administered medications for this visit.    PHYSICAL EXAMINATION: ECOG PERFORMANCE STATUS: 1 - Symptomatic but completely ambulatory  BP 123/70 (BP Location: Left Arm, Patient Position: Sitting, Cuff Size: Normal)   Pulse  95   Temp (!) 97.2 F (36.2 C) (Tympanic)   Resp 16   Ht $R'5\' 8"'iB$  (1.727 m)   Wt 146 lb (66.2 kg)   SpO2 100%   BMI 22.20 kg/m   Filed Weights   05/23/20 0949  Weight: 146 lb (66.2 kg)    Physical Exam Constitutional:      Comments: He is alone.  Appears pale.  HENT:     Head: Normocephalic and atraumatic.     Mouth/Throat:     Pharynx: No oropharyngeal exudate.  Eyes:     Pupils: Pupils are equal, round, and reactive to light.  Cardiovascular:     Rate and Rhythm: Normal rate and regular rhythm.  Pulmonary:     Effort: No respiratory distress.     Breath sounds: No wheezing.  Abdominal:     General: Bowel sounds are normal. There is no distension.     Palpations: Abdomen is soft. There is no mass.     Tenderness: There is no abdominal tenderness. There is no guarding or rebound.  Musculoskeletal:        General: No tenderness. Normal range of motion.     Cervical back: Normal range of motion and neck supple.  Skin:    General: Skin is warm.     Coloration: Skin is pale.     Comments: Multiple bruises noted in bilateral upper extremity.  Neurological:     Mental Status: He is alert and oriented to person, place, and time.  Psychiatric:        Mood and Affect: Affect normal.       LABORATORY DATA:  I have reviewed the data as listed    Component Value Date/Time   NA 138 05/23/2020 0935   NA 142 05/03/2014 1139   K 5.1 05/23/2020 0935   K 4.7 05/03/2014 1139   CL 101 05/23/2020 0935   CL 110 (H) 05/03/2014 1139   CO2 26 05/23/2020 0935   CO2 24 05/03/2014 1139   GLUCOSE 142 (H) 05/23/2020 0935   GLUCOSE 98 05/03/2014 1139   BUN 27 (H) 05/23/2020 0935   BUN 20 (H) 05/03/2014 1139   CREATININE 1.22 05/23/2020 0935   CREATININE 1.22 08/09/2014 1122   CALCIUM 9.3 05/23/2020 0935   CALCIUM 8.6 05/03/2014 1139   PROT 7.0 05/23/2020 0935   PROT 7.5 05/03/2014 1139   ALBUMIN 5.0 05/23/2020 0935   ALBUMIN 4.1 05/03/2014 1139   AST 15 05/23/2020 0935   AST  15 05/03/2014 1139   ALT 10 05/23/2020 0935   ALT 26 05/03/2014 1139   ALKPHOS 59 05/23/2020 0935   ALKPHOS 94 05/03/2014 1139   BILITOT 0.9 05/23/2020 0935   BILITOT 0.5 05/03/2014 1139   GFRNONAA >60 05/23/2020 0935   GFRNONAA >60 08/09/2014 1122   GFRAA 55 (  L) 01/24/2020 1156   GFRAA >60 08/09/2014 1122    No results found for: SPEP, UPEP  Lab Results  Component Value Date   WBC 6.1 05/23/2020   NEUTROABS 1.6 (L) 05/23/2020   HGB 8.4 (L) 05/23/2020   HCT 26.1 (L) 05/23/2020   MCV 107.9 (H) 05/23/2020   PLT 168 05/23/2020      Chemistry      Component Value Date/Time   NA 138 05/23/2020 0935   NA 142 05/03/2014 1139   K 5.1 05/23/2020 0935   K 4.7 05/03/2014 1139   CL 101 05/23/2020 0935   CL 110 (H) 05/03/2014 1139   CO2 26 05/23/2020 0935   CO2 24 05/03/2014 1139   BUN 27 (H) 05/23/2020 0935   BUN 20 (H) 05/03/2014 1139   CREATININE 1.22 05/23/2020 0935   CREATININE 1.22 08/09/2014 1122      Component Value Date/Time   CALCIUM 9.3 05/23/2020 0935   CALCIUM 8.6 05/03/2014 1139   ALKPHOS 59 05/23/2020 0935   ALKPHOS 94 05/03/2014 1139   AST 15 05/23/2020 0935   AST 15 05/03/2014 1139   ALT 10 05/23/2020 0935   ALT 26 05/03/2014 1139   BILITOT 0.9 05/23/2020 0935   BILITOT 0.5 05/03/2014 1139       RADIOGRAPHIC STUDIES: I have personally reviewed the radiological images as listed and agreed with the findings in the report. No results found.   ASSESSMENT & PLAN:  CLL (chronic lymphocytic leukemia) ([place]) # CLL/SLL- relapsed most recently s/p Gazyva x6 cycles [AUG 2021] SEP 27th 2021-improved mediastinal adenopathy ~2.5cm; ~3cm retroperitoneal adenopathy-there is no splenomegaly.  Continue surveillance; stable.  We will get a PET scan prior to next visit.  #Chronic abdominal pain-unclear etiology- STABLE.currently being titrated ; on pain pump as per pain management.   #Hyperkalemia-5.1; STABLE today- /CKD-III  [baseline 1.4; Dr.Lateef]; STABLE;  continue LoKelma as per Nephrology.   # Anemia-hemoglobin 8.7 ;Secondary to CKD versus less likley progressive leukemia -STABLE;  proceed with Aranesp today.NOV 2021- Iron sat-24%. Will proceed add venoferx 2.    # DISPOSITION:  # Aranesp today # 1 week- Venofer # 2 weeks- H&H- aranesp # 3 weeks- Venofer # 4 weeks- H&H- aranesp # 6 weeks- H&H-Aranesp # 8 weeks- MD; labs- cbc/cmpLDH; HOLD tube;iron studies/ferritin; possible aranesp; PET prior-;- Dr.B    Orders Placed This Encounter  Procedures  . NM PET Image Restag (PS) Skull Base To Thigh    Standing Status:   Future    Standing Expiration Date:   05/23/2021    Order Specific Question:   If indicated for the ordered procedure, I authorize the administration of a radiopharmaceutical per Radiology protocol    Answer:   Yes    Order Specific Question:   Preferred imaging location?    Answer:   [facility]  . [facility])    Standing Status:   Standing    Number of Occurrences:   3    Standing Expiration Date:   05/23/2021  . Hemoglobin [facility])    Standing Status:   Standing    Number of Occurrences:   3    Standing Expiration Date:   05/23/2021  . CBC with Differential    Standing Status:   Future    Standing Expiration Date:   05/23/2021  . Comprehensive metabolic panel    Standing Status:   Future    Standing Expiration Date:   05/23/2021  . Lactate dehydrogenase    Standing Status:  Future    Standing Expiration Date:   05/23/2021  . Ferritin    Standing Status:   Future    Standing Expiration Date:   05/23/2021  . Iron and TIBC    Standing Status:   Future    Standing Expiration Date:   05/23/2021  . Hold Tube- Blood Bank    Standing Status:   Future    Standing Expiration Date:   05/23/2021   All questions were answered. The patient knows to call the clinic with any problems, questions or concerns.      Cammie Sickle, MD 05/23/2020 1:22 PM

## 2020-05-23 NOTE — Assessment & Plan Note (Addendum)
#   CLL/SLL- relapsed most recently s/p Gazyva x6 cycles [AUG 2021] SEP 27th 2021-improved mediastinal adenopathy ~2.5cm; ~3cm retroperitoneal adenopathy-there is no splenomegaly.  Continue surveillance; stable.  We will get a PET scan prior to next visit.  #Chronic abdominal pain-unclear etiology- STABLE.currently being titrated ; on pain pump as per pain management.   #Hyperkalemia-5.1; STABLE today- /CKD-III  [baseline 1.4; Dr.Lateef]; STABLE; continue LoKelma as per Nephrology.   # Anemia-hemoglobin 8.7 ;Secondary to CKD versus less likley progressive leukemia -STABLE;  proceed with Aranesp today.NOV 2021- Iron sat-24%. Will proceed add venoferx 2.    # DISPOSITION:  # Aranesp today # 1 week- Venofer # 2 weeks- H&H- aranesp # 3 weeks- Venofer # 4 weeks- H&H- aranesp # 6 weeks- H&H-Aranesp # 8 weeks- MD; labs- cbc/cmpLDH; HOLD tube;iron studies/ferritin; possible aranesp; PET prior-;- Dr.B

## 2020-05-26 ENCOUNTER — Telehealth: Payer: Self-pay | Admitting: Pain Medicine

## 2020-05-26 NOTE — Telephone Encounter (Signed)
Patient wanted to clarify his appointment date and time.  Informed of appointment date and time.

## 2020-05-31 ENCOUNTER — Inpatient Hospital Stay: Payer: Medicare Other

## 2020-05-31 NOTE — Progress Notes (Signed)
PROVIDER NOTE: Information contained herein reflects review and annotations entered in association with encounter. Interpretation of such information and data should be left to medically-trained personnel. Information provided to patient can be located elsewhere in the medical record under "Patient Instructions". Document created using STT-dictation technology, any transcriptional errors that may result from process are unintentional.    Patient: James [name]  Service Category: Procedure  Provider: Gaspar Cola, MD  DOB: [birth year]  DOS: 06/01/2020  Location: [facility]  MRN: [id number]  Setting: Ambulatory - outpatient  Referring Provider: [name], MD  Type: Established Patient  Specialty: Interventional Pain Management  PCP: [name], MD   Primary Reason for Visit: Interventional Pain Management Treatment. CC: Abdominal Pain (Upper quadrant )  Procedure:          Intrathecal Drug Delivery System (IDDS):  Type: Reservoir Refill [phone]) No rate change Region: Abdominal Laterality: Left  Type of Pump: Medtronic Synchromed II Delivery Route: Intrathecal Type of Pain Treated: Neuropathic/Nociceptive Primary Medication Class: Opioid/opiate  Medication, Concentration, Infusion Program, & Delivery Rate: Please see scanned programming printout.   Indications: 1. Chronic pain syndrome   2. Chronic epigastric pain (1ry area of Pain)   3. Chronic abdominal pain   4. Cancer-related pain   5. CLL (chronic lymphocytic leukemia) ([place])   6. Malignant lymphoma, small lymphocytic (HCC)   7. Abdominal wall pain in epigastric region   8. Non-traumatic compression fracture of T2 thoracic vertebra, sequela   9. Non-traumatic compression fracture of T1 thoracic vertebra, sequela   10. Presence of implanted infusion pump (Medtronic)   11. Presence of intrathecal pump (Medtronic)   12. Encounter for adjustment and management of infusion pump   13. Non-traumatic  compression fracture of T3 thoracic vertebra, sequela    Pain Assessment: Self-Reported Pain Score: 7 /10             Reported level is compatible with observation.         We are pending to do a catheter study to determine if the catheter remains within the intrathecal space or if it has migrated to the epidural space.  In addition, we are also considering a splanchnic nerve alcohol neurolysis.  Unfortunately, the patient's wife has suffered a stroke and he has been unable to keep his appointments.  In fact, today he comes in for a pump refill, but he is very anxious and wants to go as soon as possible since she is currently on her way to the emergency room in an ambulance.  Apparently she is again having some serious health issues.  Today we will simply refill the pump and let him go so that he can be with his wife.  Pharmacotherapy Assessment  Analgesic: Oxycodone IR 88m, 1 tab PO q 4 hrs (60 mg/day of oxycodone IR) (90 MME) + morphine ER 30 mg twice daily (60 mg/day of morphine ER) (60 MME) (150 MME/day). Highest recorded MME/day: 150 mg/day 90 day average MME/day: 77.89 mg/day   Monitoring: [place] PMP: PDMP reviewed during this encounter.       Pharmacotherapy: No side-effects or adverse reactions reported. Compliance: No problems identified. Effectiveness: Clinically acceptable. Plan: Refer to "POC".  UDS: No results found for: SUMMARY  Intrathecal Pump Therapy Assessment  Manufacturer: Medtronic Synchromed Type: Programmable Volume: 40 mL reservoir MRI compatibility: Yes   Drug content:  Primary Medication Class:Opioid Primary Medication:PF-Fentanyl(2000 mcg/mL) Secondary Medication:PF-Bupivacaine(20 mg/mL) Other Medication:None  PTM(PCA) mode: PTM dose:25 mcg bolus Duration of bolus:1 minute Lockout  interval:4hours Maximum 24-hour doses:6   Programming:  Type: Simple continuous. See pump readout for details.   Changes:  Medication Change: None at this  point Rate Change: No change in rate  Reported side-effects or adverse reactions: None reported  Effectiveness: Described as relatively effective, allowing for increase in activities of daily living (ADL) Clinically meaningful improvement in function (CMIF): Sustained CMIF goals met  Plan: Pump refill today  Pre-op H&P Assessment:  Mr. Wojciak is a 70 y.o. (year old), male patient, seen today for interventional treatment. He  has a past surgical history that includes Cholecystectomy (1983); Esophagogastroduodenoscopy (egd) with propofol (N/A, 11/20/2016); Cataract extraction w/ intraocular lens implant (Bilateral); Lower Extremity Angiography (Right, 05/19/2017); Esophagogastroduodenoscopy (egd) with propofol (N/A, 10/27/2017); Colonoscopy with propofol (N/A, 10/27/2017); Lower Extremity Angiography (Left, 08/10/2018); and Intrathecal pump implant (Left, 02/07/2020). Mr. Burciaga has a current medication list which includes the following prescription(s): atorvastatin, clopidogrel, ensure, folic acid, gabapentin, metformin, methocarbamol, naloxegol oxalate, naloxone, [START ON 06/11/2020] oxycodone hcl, and pantoprazole. His primarily concern today is the Abdominal Pain (Upper quadrant )  Initial Vital Signs:  Pulse/HCG Rate: (!) 125  Temp: (!) 96.4 F (35.8 C) Resp: 16 BP: 114/74 SpO2: 98 %  BMI: Estimated body mass index is 22.05 kg/m as calculated from the following:   Height as of this encounter: 5' 8"  (1.727 m).   Weight as of this encounter: 145 lb (65.8 kg).  Risk Assessment: Allergies: Reviewed. He has No Known Allergies.  Allergy Precautions: None required Coagulopathies: Reviewed. None identified.  Blood-thinner therapy: None at this time Active Infection(s): Reviewed. None identified. Mr. Kotowski is afebrile  Site Confirmation: Mr. Oleksy was asked to confirm the procedure and laterality before marking the site Procedure checklist: Completed Consent: Before the procedure and under the  influence of no sedative(s), amnesic(s), or anxiolytics, the patient was informed of the treatment options, risks and possible complications. To fulfill our ethical and legal obligations, as recommended by the American Medical Association's Code of Ethics, I have informed the patient of my clinical impression; the nature and purpose of the treatment or procedure; the risks, benefits, and possible complications of the intervention; the alternatives, including doing nothing; the risk(s) and benefit(s) of the alternative treatment(s) or procedure(s); and the risk(s) and benefit(s) of doing nothing.  Mr. Ozaki was provided with information about the general risks and possible complications associated with most interventional procedures. These include, but are not limited to: failure to achieve desired goals, infection, bleeding, organ or nerve damage, allergic reactions, paralysis, and/or death.  In addition, he was informed of those risks and possible complications associated to this particular procedure, which include, but are not limited to: damage to the implant; failure to decrease pain; local, systemic, or serious CNS infections, intraspinal abscess with possible cord compression and paralysis, or life-threatening such as meningitis; bleeding; organ damage; nerve injury or damage with subsequent sensory, motor, and/or autonomic system dysfunction, resulting in transient or permanent pain, numbness, and/or weakness of one or several areas of the body; allergic reactions, either minor or major life-threatening, such as anaphylactic or anaphylactoid reactions.  Furthermore, Mr. Hyde was informed of those risks and complications associated with the medications. These include, but are not limited to: allergic reactions (i.e.: anaphylactic or anaphylactoid reactions); endorphine suppression; bradycardia and/or hypotension; water retention and/or peripheral vascular relaxation leading to lower extremity edema and  possible stasis ulcers; respiratory depression and/or shortness of breath; decreased metabolic rate leading to weight gain; swelling or edema; medication-induced neural toxicity; particulate matter  embolism and blood vessel occlusion with resultant organ, and/or nervous system infarction; and/or intrathecal granuloma formation with possible spinal cord compression and permanent paralysis.  Before refilling the pump Mr. Poffenberger was informed that some of the medications used in the devise may not be FDA approved for such use and therefore it constitutes an off-label use of the medications.  Finally, he was informed that Medicine is not an exact science; therefore, there is also the possibility of unforeseen or unpredictable risks and/or possible complications that may result in a catastrophic outcome. The patient indicated having understood very clearly. We have given the patient no guarantees and we have made no promises. Enough time was given to the patient to ask questions, all of which were answered to the patient's satisfaction. Mr. Flury has indicated that he wanted to continue with the procedure. Attestation: I, the ordering provider, attest that I have discussed with the patient the benefits, risks, side-effects, alternatives, likelihood of achieving goals, and potential problems during recovery for the procedure that I have provided informed consent. Date  Time: 06/01/2020  1:12 PM  Pre-Procedure Preparation:  Monitoring: As per clinic protocol. Respiration, ETCO2, SpO2, BP, heart rate and rhythm monitor placed and checked for adequate function Safety Precautions: Patient was assessed for positional comfort and pressure points before starting the procedure. Time-out: I initiated and conducted the "Time-out" before starting the procedure, as per protocol. The patient was asked to participate by confirming the accuracy of the "Time Out" information. Verification of the correct person, site, and  procedure were performed and confirmed by me, the nursing staff, and the patient. "Time-out" conducted as per Joint Commission's Universal Protocol (UP.01.01.01). Time: 1324  Description of Procedure:          Position: Supine Target Area: Central-port of intrathecal pump. Approach: Anterior, 90 degree angle approach. Area Prepped: Entire Area around the pump implant. DuraPrep (Iodine Povacrylex [0.7% available iodine] and Isopropyl Alcohol, 74% w/w) Safety Precautions: Aspiration looking for blood return was conducted prior to all injections. At no point did we inject any substances, as a needle was being advanced. No attempts were made at seeking any paresthesias. Safe injection practices and needle disposal techniques used. Medications properly checked for expiration dates. SDV (single dose vial) medications used. Description of the Procedure: Protocol guidelines were followed. Two nurses trained to do implant refills were present during the entire procedure. The refill medication was checked by both healthcare providers as well as the patient. The patient was included in the "Time-out" to verify the medication. The patient was placed in position. The pump was identified. The area was prepped in the usual manner. The sterile template was positioned over the pump, making sure the side-port location matched that of the pump. Both, the pump and the template were held for stability. The needle provided in the Medtronic Kit was then introduced thru the center of the template and into the central port. The pump content was aspirated and discarded volume documented. The new medication was slowly infused into the pump, thru the filter, making sure to avoid overpressure of the device. The needle was then removed and the area cleansed, making sure to leave some of the prepping solution back to take advantage of its long term bactericidal properties. The pump was interrogated and programmed to reflect the correct  medication, volume, and dosage. The program was printed and taken to the physician for approval. Once checked and signed by the physician, a copy was provided to the patient and another  scanned into the EMR. Vitals:   06/01/20 1310  BP: 114/74  Pulse: (!) 125  Resp: 16  Temp: (!) 96.4 F (35.8 C)  TempSrc: Temporal  SpO2: 98%  Weight: 145 lb (65.8 kg)  Height: 5' 8"  (1.727 m)    Start Time: 1327 hrs. End Time: 1339 hrs. Materials & Medications: Medtronic Refill Kit Medication(s): Please see chart orders for details.  Imaging Guidance:          Type of Imaging Technique: None used Indication(s): N/A Exposure Time: No patient exposure Contrast: None used. Fluoroscopic Guidance: N/A Ultrasound Guidance: N/A Interpretation: N/A  Antibiotic Prophylaxis:   Anti-infectives (From admission, onward)   None     Indication(s): None identified  Post-operative Assessment:  Post-procedure Vital Signs:  Pulse/HCG Rate: (!) 125  Temp: (!) 96.4 F (35.8 C) Resp: 16 BP: 114/74 SpO2: 98 %  EBL: None  Complications: No immediate post-treatment complications observed by team, or reported by patient.  Note: The patient tolerated the entire procedure well. A repeat set of vitals were taken after the procedure and the patient was kept under observation following institutional policy, for this type of procedure. Post-procedural neurological assessment was performed, showing return to baseline, prior to discharge. The patient was provided with post-procedure discharge instructions, including a section on how to identify potential problems. Should any problems arise concerning this procedure, the patient was given instructions to immediately contact us, at any time, without hesitation. In any case, we plan to contact the patient by telephone for a follow-up status report regarding this interventional procedure.  Comments:  No additional relevant information.  Plan of Care  Orders:  Orders  Placed This Encounter  Procedures  . PUMP REFILL    Maintain Protocol by having two(2) healthcare providers during procedure and programming.    Scheduling Instructions:     Please refill intrathecal pump today.    Order Specific Question:   Where will this procedure be performed?    Answer:   [facility]  . PUMP REFILL    Whenever possible schedule on a procedure today.    Standing Status:   Future    Standing Expiration Date:   10/29/2020    Scheduling Instructions:     Please schedule intrathecal pump refill based on pump programming. Avoid schedule intervals of more than 120 days (4 months).    Order Specific Question:   Where will this procedure be performed?    Answer:   [facility]  . Informed Consent Details: Physician/Practitioner Attestation; Transcribe to consent form and obtain patient signature    Transcribe to consent form and obtain patient signature.    Order Specific Question:   Physician/Practitioner attestation of informed consent for procedure/surgical case    Answer:   I, the physician/practitioner, attest that I have discussed with the patient the benefits, risks, side effects, alternatives, likelihood of achieving goals and potential problems during recovery for the procedure that I have provided informed consent.    Order Specific Question:   Procedure    Answer:   Intrathecal pump refill    Order Specific Question:   Physician/Practitioner performing the procedure    Answer:   Attending Physician: Kathlen Brunswick. Dossie Arbour, MD & designated trained staff    Order Specific Question:   Indication/Reason    Answer:   Chronic Pain Syndrome (G89.4), presence of an intrathecal pump (Z97.8)   Chronic Opioid Analgesic:  Oxycodone IR 25m, 1 tab PO q 4 hrs (60 mg/day of oxycodone  IR) (90 MME) + morphine ER 30 mg twice daily (60 mg/day of morphine ER) (60 MME) (150 MME/day). Highest recorded MME/day: 150 mg/day 90 day average MME/day: 77.89 mg/day   Medications  ordered for procedure: Meds ordered this encounter  Medications  . gabapentin (NEURONTIN) 300 MG capsule    Sig: Take 3 capsules (900 mg total) by mouth at bedtime AND 1 capsule (300 mg total) 2 (two) times daily. Follow titration schedule.Marland Kitchen    Dispense:  300 capsule    Refill:  0    Fill one day early if pharmacy is closed on scheduled refill date. May substitute for generic if available.  . Oxycodone HCl 10 MG TABS    Sig: Take 1 tablet (10 mg total) by mouth every 4 (four) hours as needed. Must last 30 days    Dispense:  180 tablet    Refill:  0    Chronic Pain: STOP Act (Not applicable) Fill 1 day early if closed on refill date. Avoid benzodiazepines within 8 hours of opioids   Medications administered: Derin F. Chalmers had no medications administered during this visit.  See the medical record for exact dosing, route, and time of administration.  Follow-up plan:   Return for Pump Refill (Max:87mo).       Interventional Therapies  Risk  Complexity Considerations:   NOTE: PLAVIX ANTICOAGULATION  (Stop: 7 days  Restart: 2 hrs)   Planned  Pending:   Therapeutic bilateral splanchnic nerves neurolysis #1 with alcohol  Diagnostic intrathecal catheter test under fluoroscopic guidance to determine location    Under consideration:   Possible revision of intrathecal catheter    Completed:   Diagnostic bilateral celiac plexus block x2 (06/01/2019; 11/02/2019) (50/30/30/0) (75/75/0/0)  Therapeutic bilateral celiac plexus neurolysis with phenol x1 (04/06/2020)  Diagnostic bilateral rectus abdominis trigger point injection x1 (04/06/2020) (0/0/0)  Diagnostic (ML) T12-L1 intrathecal injection of PF-fentanyl 25 mcg as an intrathecal pump trial x1 (12/14/2019)   Therapeutic  Palliative (PRN) options:   Intrathecal pump management     Recent Visits Date Type Provider Dept  05/04/20 Procedure visit Milinda Pointer, MD [facility]  05/01/20 Telemedicine Milinda Pointer, [facility]  04/18/20 Office Visit Milinda Pointer, MD [facility]  04/11/20 Office Visit Milinda Pointer, MD [facility]  04/06/20 Procedure visit Milinda Pointer, MD [facility]  03/29/20 Procedure visit Milinda Pointer, MD [facility]  03/27/20 Procedure visit Milinda Pointer, MD [facility]  03/21/20 Procedure visit Milinda Pointer, MD [facility]  03/13/20 Procedure visit Milinda Pointer, MD [facility]  03/09/20 Procedure visit Milinda Pointer, MD [facility]  Showing recent visits within past 90 days and meeting all other requirements Today's Visits Date Type Provider Dept  06/01/20 Procedure visit Milinda Pointer, MD [facility]  Showing today's visits and meeting all other requirements Future Appointments Date Type Provider Dept  06/27/20 Appointment Milinda Pointer, MD [facility]  Showing future appointments within next 90 days and meeting all other requirements  Disposition: Discharge home  Discharge (Date  Time): 06/01/2020; 1400 hrs.   Primary Care Physician: [name], MD Location: [facility] Note by: Gaspar Cola, MD Date: 06/01/2020; Time: 2:17 PM  Disclaimer:  Medicine is not an Chief Strategy Officer. The only guarantee in medicine is that nothing is guaranteed. It is important to note that the decision to proceed with this intervention was based on the information collected from the patient. The  Data and conclusions were drawn from the patient's questionnaire, the interview, and the physical examination. Because the information was provided in large part by the patient, it cannot be guaranteed that it has not been purposely or unconsciously manipulated. Every effort has been made to obtain as much relevant data as possible for this evaluation. It is important to note that the conclusions that lead  to this procedure are derived in large part from the available data. Always take into account that the treatment will also be dependent on availability of resources and existing treatment guidelines, considered by other Pain Management Practitioners as being common knowledge and practice, at the time of the intervention. For Medico-Legal purposes, it is also important to point out that variation in procedural techniques and pharmacological choices are the acceptable norm. The indications, contraindications, technique, and results of the above procedure should only be interpreted and judged by a Board-Certified Interventional Pain Specialist with extensive familiarity and expertise in the same exact procedure and technique.

## 2020-06-01 ENCOUNTER — Encounter: Payer: Medicare Other | Admitting: Pain Medicine

## 2020-06-01 ENCOUNTER — Encounter: Payer: Self-pay | Admitting: Pain Medicine

## 2020-06-01 ENCOUNTER — Other Ambulatory Visit: Payer: Self-pay

## 2020-06-01 ENCOUNTER — Ambulatory Visit: Payer: Medicare Other | Attending: Pain Medicine | Admitting: Pain Medicine

## 2020-06-01 VITALS — BP 114/74 | HR 125 | Temp 96.4°F | Resp 16 | Ht 68.0 in | Wt 145.0 lb

## 2020-06-01 DIAGNOSIS — G8929 Other chronic pain: Secondary | ICD-10-CM

## 2020-06-01 DIAGNOSIS — G894 Chronic pain syndrome: Secondary | ICD-10-CM

## 2020-06-01 DIAGNOSIS — M4854XS Collapsed vertebra, not elsewhere classified, thoracic region, sequela of fracture: Secondary | ICD-10-CM | POA: Diagnosis not present

## 2020-06-01 DIAGNOSIS — Z451 Encounter for adjustment and management of infusion pump: Secondary | ICD-10-CM | POA: Diagnosis not present

## 2020-06-01 DIAGNOSIS — C9110 Chronic lymphocytic leukemia of B-cell type not having achieved remission: Secondary | ICD-10-CM | POA: Diagnosis not present

## 2020-06-01 DIAGNOSIS — C8590 Non-Hodgkin lymphoma, unspecified, unspecified site: Secondary | ICD-10-CM | POA: Insufficient documentation

## 2020-06-01 DIAGNOSIS — Z978 Presence of other specified devices: Secondary | ICD-10-CM

## 2020-06-01 DIAGNOSIS — C8300 Small cell B-cell lymphoma, unspecified site: Secondary | ICD-10-CM

## 2020-06-01 DIAGNOSIS — G893 Neoplasm related pain (acute) (chronic): Secondary | ICD-10-CM | POA: Diagnosis not present

## 2020-06-01 DIAGNOSIS — Z95828 Presence of other vascular implants and grafts: Secondary | ICD-10-CM

## 2020-06-01 DIAGNOSIS — R1013 Epigastric pain: Secondary | ICD-10-CM | POA: Diagnosis not present

## 2020-06-01 DIAGNOSIS — M792 Neuralgia and neuritis, unspecified: Secondary | ICD-10-CM

## 2020-06-01 MED ORDER — OXYCODONE HCL 10 MG PO TABS
10.0000 mg | ORAL_TABLET | ORAL | 0 refills | Status: DC | PRN
Start: 2020-06-11 — End: 2020-06-27

## 2020-06-01 MED ORDER — GABAPENTIN 300 MG PO CAPS: ORAL_CAPSULE | ORAL | 0 refills | Status: DC

## 2020-06-01 NOTE — Patient Instructions (Signed)
Opioid Overdose Opioids are drugs that are often used to treat pain. Opioids include illegal drugs, such as heroin, as well as prescription pain medicines, such as codeine, morphine, hydrocodone, oxycodone, and fentanyl. An opioid overdose happens when you take too much of an opioid. An overdose may be intentional or accidental and can happen with any type of opioid. The effects of an overdose can be mild, dangerous, or even deadly. Opioid overdose is a medical emergency. What are the causes? This condition may be caused by:  Taking too much of an opioid on purpose.  Taking too much of an opioid by accident.  Using two or more substances that contain opioids at the same time.  Taking an opioid with a substance that affects your heart, breathing, or blood pressure. These include alcohol, tranquilizers, sleeping pills, illegal drugs, and some over-the-counter medicines. This condition may also happen due to an error made by:  A health care provider who prescribes a medicine.  The pharmacist who fills the prescription order. What increases the risk? This condition is more likely in:  Children. They may be attracted to colorful pills. Because of a child's small size, even a small amount of a drug can be dangerous.  Older people. They may be taking many different drugs. Older people may have difficulty reading labels or remembering when they last took their medicine. They may also be more sensitive to the effects of opioids.  People with chronic medical conditions, especially heart, liver, kidney, or neurological diseases.  People who take an opioid for a long period of time.  People who use: ? Illegal drugs. IV heroin is especially dangerous. ? Other substances, including alcohol, while using an opioid.  People who have: ? A history of drug or alcohol abuse. ? Certain mental health conditions. ? A history of previous drug overdoses.  People who take opioids that are not prescribed  for them. What are the signs or symptoms? Symptoms of this condition depend on the type of opioid and the amount that was taken. Common symptoms include:  Sleepiness or difficulty waking from sleep.  Decrease in attention.  Confusion.  Slurred speech.  Slowed breathing and a slow pulse (bradycardia).  Nausea and vomiting.  Abnormally small pupils. Signs and symptoms that require emergency treatment include:  Cold, clammy, and pale skin.  Blue lips and fingernails.  Vomiting.  Gurgling sounds in the throat.  A pulse that is very slow or difficult to detect.  Breathing that is very irregular, slow, noisy, or difficult to detect.  Limp body.  Inability to respond to speech or be awakened from sleep (stupor).  Seizures. How is this diagnosed? This condition is diagnosed based on your symptoms and medical history. It is important to tell your health care provider:  About all of the opioids that you took.  When you took the opioids.  Whether you were drinking alcohol or using marijuana, cocaine, or other drugs. Your health care provider will do a physical exam. This exam may include:  Checking and monitoring your heart rate and rhythm, breathing rate, temperature, and blood pressure (vital signs).  Measuring oxygen levels in your blood.  Checking for abnormally small pupils. You may also have blood tests or urine tests. You may have X-rays if you are having severe breathing problems. How is this treated? This condition requires immediate medical treatment and hospitalization. Treatment is given in the hospital intensive care (ICU) setting. Supporting your vital signs and your breathing is the first step in   treating an opioid overdose. Treatment may also include:  Giving salts and minerals (electrolytes) along with fluids through an IV.  Inserting a breathing tube (endotracheal tube) in your airway to help you breathe if you cannot breathe on your own or you are in  danger of not being able to breathe on your own.  Giving oxygen through a small tube under your nose.  Passing a tube through your nose and into your stomach (nasogastric tube, or NG tube) to empty your stomach.  Giving medicines that: ? Increase your blood pressure. ? Relieve nausea and vomiting. ? Relieve abdominal pain and cramping. ? Reverse the effects of the opioid (naloxone).  Monitoring your heart and oxygen levels.  Ongoing counseling and mental health support if you intentionally overdosed or used an illegal drug. Follow these instructions at home: Medicines  Take over-the-counter and prescription medicines only as told by your health care provider.  Always ask your health care provider about possible side effects and interactions of any new medicine that you start taking.  Keep a list of all the medicines that you take, including over-the-counter medicines. Bring this list with you to all your medical visits. General instructions  Drink enough fluid to keep your urine pale yellow.  Keep all follow-up visits as told by your health care provider. This is important.   How is this prevented?  Read the drug inserts that come with your opioid pain medicines.  Take medicines only as told by your health care provider. Do not take more medicine than you are told. Do not take medicines more frequently than you are told.  Do not drink alcohol or take sedatives when taking opioids.  Do not use illegal or recreational drugs, including cocaine, ecstasy, and marijuana.  Do not take opioid medicines that are not prescribed for you.  Store all medicines in safety containers that are out of the reach of children.  Get help if you are struggling with: ? Alcohol or drug use. ? Depression or another mental health problem. ? Thoughts of hurting yourself or another person.  Keep the phone number of your local poison control center near your phone or in your mobile phone. In the  [place] the hotline of the National Poison Control Center is (800) 222-1222.  If you were prescribed naloxone, make sure you understand how to take it. Contact a health care provider if you:  Need help understanding how to take your pain medicines.  Feel your medicines are too strong.  Are concerned that your pain medicines are not working well for your pain.  Develop new symptoms or side effects when you are taking medicines. Get help right away if:  You or someone else is having symptoms of an opioid overdose. Get help even if you are not sure.  You have serious thoughts about hurting yourself or others.  You have: ? Chest pain. ? Difficulty breathing. ? A loss of consciousness. These symptoms may represent a serious problem that is an emergency. Do not wait to see if the symptoms will go away. Get medical help right away. Call your local emergency services (911 in the [place] Do not drive yourself to the hospital. If you ever feel like you may hurt yourself or others, or have thoughts about taking your own life, get help right away. You can go to your nearest emergency department or call:  Your local emergency services (911 in the [place]  A suicide crisis helpline, such as the National Suicide Prevention Lifeline   at 1-800-273-8255. This is open 24 hours a day. Summary  Opioids are drugs that are often used to treat pain. Opioids include illegal drugs, such as heroin, as well as prescription pain medicines.  An opioid overdose happens when you take too much of an opioid.  Overdoses can be intentional or accidental.  Opioid overdose is very dangerous. It is a life-threatening emergency.  If you or someone you know is experiencing an opioid overdose, get help right away. This information is not intended to replace advice given to you by your health care provider. Make sure you discuss any questions you have with your health care provider. Document Revised: 04/02/2018 Document  Reviewed: 04/02/2018 [employer] Patient Education  2021 [employer]  

## 2020-06-02 ENCOUNTER — Inpatient Hospital Stay: Payer: Medicare Other

## 2020-06-02 ENCOUNTER — Telehealth: Payer: Self-pay | Admitting: *Deleted

## 2020-06-02 NOTE — Telephone Encounter (Signed)
No problems post IT pump fill. 

## 2020-06-05 ENCOUNTER — Other Ambulatory Visit: Payer: Self-pay

## 2020-06-05 MED ORDER — PAIN MANAGEMENT IT PUMP REFILL: 1.0000 | Freq: Once | INTRATHECAL | 0 refills | Status: AC

## 2020-06-06 ENCOUNTER — Telehealth: Payer: Self-pay | Admitting: *Deleted

## 2020-06-06 ENCOUNTER — Inpatient Hospital Stay: Payer: Medicare Other

## 2020-06-06 NOTE — Telephone Encounter (Signed)
Opened duplicate phone note.

## 2020-06-06 NOTE — Telephone Encounter (Signed)
Contacted patient as he missed his apts today for lab/aranesp. Patient states that his wife is on a ventilator and is expected to pass away very soon. He is unable to keep today's apts for this reason. He stated that the family has been called into the hospital to  Discuss taking his wife off any life saving measures. He was very emotional on the phone. He thanked me for calling him. For now, he will just keep the apt for venofer next week and will call me back to r/s at a later time if the plan changes.  Colette, Please change the apt next week to lab/venofer vs aranesp on 2/15

## 2020-06-13 ENCOUNTER — Inpatient Hospital Stay: Payer: Medicare Other | Attending: Internal Medicine

## 2020-06-13 ENCOUNTER — Inpatient Hospital Stay: Payer: Medicare Other

## 2020-06-13 DIAGNOSIS — D631 Anemia in chronic kidney disease: Secondary | ICD-10-CM | POA: Insufficient documentation

## 2020-06-13 DIAGNOSIS — N183 Chronic kidney disease, stage 3 unspecified: Secondary | ICD-10-CM | POA: Insufficient documentation

## 2020-06-14 ENCOUNTER — Other Ambulatory Visit: Payer: Self-pay | Admitting: *Deleted

## 2020-06-14 ENCOUNTER — Telehealth: Payer: Self-pay | Admitting: *Deleted

## 2020-06-14 ENCOUNTER — Telehealth: Payer: Self-pay | Admitting: Internal Medicine

## 2020-06-14 DIAGNOSIS — C9110 Chronic lymphocytic leukemia of B-cell type not having achieved remission: Secondary | ICD-10-CM

## 2020-06-14 DIAGNOSIS — D631 Anemia in chronic kidney disease: Secondary | ICD-10-CM

## 2020-06-14 DIAGNOSIS — N183 Chronic kidney disease, stage 3 unspecified: Secondary | ICD-10-CM

## 2020-06-14 NOTE — Telephone Encounter (Signed)
Patient's wife died and patient called to r/s his apt. Spoke to patient and his Momen Ham ([phone]). Son is from [place]. apts given to both he and his son. Patient's son is trying to persuade patient to move to [place] for social support. Son will get information on available clinics near [place] and pt/son will let our office know should transfer of records be needed.  Pt agreeable to r/s lab/aranesp tomorrow and venofer on Monday. Apt times given to patient.

## 2020-06-14 NOTE — Telephone Encounter (Signed)
Patient called r/s missed appts on 2/15. Routing to scheduler for return call.

## 2020-06-15 ENCOUNTER — Inpatient Hospital Stay: Payer: Medicare Other

## 2020-06-15 ENCOUNTER — Other Ambulatory Visit: Payer: Self-pay | Admitting: *Deleted

## 2020-06-15 ENCOUNTER — Telehealth: Payer: Self-pay | Admitting: *Deleted

## 2020-06-15 VITALS — BP 117/80 | HR 102

## 2020-06-15 DIAGNOSIS — C9110 Chronic lymphocytic leukemia of B-cell type not having achieved remission: Secondary | ICD-10-CM

## 2020-06-15 DIAGNOSIS — D649 Anemia, unspecified: Secondary | ICD-10-CM

## 2020-06-15 DIAGNOSIS — N183 Chronic kidney disease, stage 3 unspecified: Secondary | ICD-10-CM

## 2020-06-15 DIAGNOSIS — D631 Anemia in chronic kidney disease: Secondary | ICD-10-CM | POA: Diagnosis not present

## 2020-06-15 LAB — COMPREHENSIVE METABOLIC PANEL
ALT: 10 U/L (ref 0–44)
AST: 12 U/L — ABNORMAL LOW (ref 15–41)
Albumin: 4.3 g/dL (ref 3.5–5.0)
Alkaline Phosphatase: 58 U/L (ref 38–126)
Anion gap: 9 (ref 5–15)
BUN: 24 mg/dL — ABNORMAL HIGH (ref 8–23)
CO2: 26 mmol/L (ref 22–32)
Calcium: 9 mg/dL (ref 8.9–10.3)
Chloride: 105 mmol/L (ref 98–111)
Creatinine, Ser: 0.93 mg/dL (ref 0.61–1.24)
GFR, Estimated: >60 mL/min (ref >60)
Glucose, Bld: 127 mg/dL — ABNORMAL HIGH (ref 70–99)
Potassium: 5.1 mmol/L (ref 3.5–5.1)
Sodium: 140 mmol/L (ref 135–145)
Total Bilirubin: 0.8 mg/dL (ref 0.3–1.2)
Total Protein: 6.7 g/dL (ref 6.5–8.1)

## 2020-06-15 LAB — CBC WITH DIFFERENTIAL/PLATELET
Abs Immature Granulocytes: 0.02 10*3/uL (ref 0.00–0.07)
Basophils Absolute: 0 10*3/uL (ref 0.0–0.1)
Basophils Relative: 1 %
Eosinophils Absolute: 0.1 10*3/uL (ref 0.0–0.5)
Eosinophils Relative: 1 %
HCT: 22.4 % — ABNORMAL LOW (ref 39.0–52.0)
Hemoglobin: 7.3 g/dL — ABNORMAL LOW (ref 13.0–17.0)
Immature Granulocytes: 0 %
Lymphocytes Relative: 55 %
Lymphs Abs: 4.5 10*3/uL — ABNORMAL HIGH (ref 0.7–4.0)
MCH: 35.8 pg — ABNORMAL HIGH (ref 26.0–34.0)
MCHC: 32.6 g/dL (ref 30.0–36.0)
MCV: 109.8 fL — ABNORMAL HIGH (ref 80.0–100.0)
Monocytes Absolute: 0.8 10*3/uL (ref 0.1–1.0)
Monocytes Relative: 10 %
Neutro Abs: 2.7 10*3/uL (ref 1.7–7.7)
Neutrophils Relative %: 33 %
Platelets: 174 10*3/uL (ref 150–400)
RBC: 2.04 MIL/uL — ABNORMAL LOW (ref 4.22–5.81)
RDW: 18.1 % — ABNORMAL HIGH (ref 11.5–15.5)
WBC: 8 10*3/uL (ref 4.0–10.5)
nRBC: 0 % (ref 0.0–0.2)

## 2020-06-15 LAB — IRON AND TIBC
Iron: 145 ug/dL (ref 45–182)
Saturation Ratios: 41 % — ABNORMAL HIGH (ref 17.9–39.5)
TIBC: 356 ug/dL (ref 250–450)
UIBC: 211 ug/dL

## 2020-06-15 LAB — FERRITIN: Ferritin: 90 ng/mL (ref 24–336)

## 2020-06-15 LAB — PREPARE RBC (CROSSMATCH)

## 2020-06-15 LAB — SAMPLE TO BLOOD BANK

## 2020-06-15 LAB — LACTATE DEHYDROGENASE: LDH: 110 U/L (ref 98–192)

## 2020-06-15 MED ORDER — DARBEPOETIN ALFA 300 MCG/0.6ML IJ SOSY
300.0000 ug | PREFILLED_SYRINGE | Freq: Once | INTRAMUSCULAR | Status: AC
  Administered 2020-06-15: 300 ug via SUBCUTANEOUS
  Filled 2020-06-15: qty 0.6

## 2020-06-15 NOTE — Telephone Encounter (Signed)
V/o - per Dr. Rogue Bussing- RN entered for patient orders for 1 unit of blood tomorrow. Pt type/screened today for 1 unit. apts given to patient/pt's son for 10 am tomorrow.

## 2020-06-15 NOTE — Telephone Encounter (Addendum)
Patient and his son requested to speak to [name], RN today at his lab/aranesp apt. Patient feels "very weak" today. bp 117/80; HR 102. Pt sch. For aranesp today and venofer on Monday.  Patient He has missed several apts in the cancer center due to his wife's death. Patient very emotional today. Discussed care with patient's son, Momodou Consiglio. Patient had updated his DPR today to include his son. While at the visit, I assisted the patient/his son to set up mychart.   Son will be going back to [place] in the next few days. He would appreciate Dr. Rogue Bussing calling him directly to discuss his dad's care when md is available. Dr. Mallie Snooks can be reached at [phone]   Per son, patient did not disclose his cancer diagnosis to his son until about 4-5 years ago. His mother told him about Mr. Amores' condition. Patient very private and does not discuss his personal care with anyone. Son has many questions regarding the prognosis/goals of care. Discussed with son, that patient was dx in 2006 with CLL and given chemotherapy to treat the CLL. In 2015/2016- He had disease progression, which he had been on an oral chemotherapy pill followed by additional chemotherapy IV treatment this past year. I explained to son that goals are palliative intent. He is currently being managed to keep his RBC count up with supportive aranesp injection, venofer and possible blood transfusions. He gave verbal understanding. Son also tearful and expressed that patient has no local social support. He has urged his dad to come back to [place] and live with he, his wife/children. However, patient reluctant to move. I reassured the patient that if he decided to move with his son, we can get his records available to help with a smooth transition in medical care. Mr. Damondre Pfeifle, expressed that he rather stay local for now. He does not want to move. In activating the mychart for the patient, the son hopes be involved with the goals of care and  updates on the patient's condition. I reassured the son that Dr. Rogue Bussing is an excellent physician and is willing to keep the son updated on the patient's condition. The son/patient verbalized appreciation for the care that Dr. B and his team have provided.  Son provided me with his FMLA. I will complete these and inform the son when the forms are completed. He would like the forms to be faxed/emailed to his employer.  Dr. Jacinto Reap- Please contact patient's son when able. Thanks. [name], RN

## 2020-06-16 ENCOUNTER — Telehealth: Payer: Self-pay | Admitting: Internal Medicine

## 2020-06-16 ENCOUNTER — Inpatient Hospital Stay: Payer: Medicare Other

## 2020-06-16 DIAGNOSIS — N183 Chronic kidney disease, stage 3 unspecified: Secondary | ICD-10-CM | POA: Diagnosis not present

## 2020-06-16 DIAGNOSIS — C9110 Chronic lymphocytic leukemia of B-cell type not having achieved remission: Secondary | ICD-10-CM

## 2020-06-16 DIAGNOSIS — D649 Anemia, unspecified: Secondary | ICD-10-CM

## 2020-06-16 MED ORDER — ACETAMINOPHEN 325 MG PO TABS
650.0000 mg | ORAL_TABLET | Freq: Once | ORAL | Status: AC
  Administered 2020-06-16: 650 mg via ORAL
  Filled 2020-06-16: qty 2

## 2020-06-16 MED ORDER — DIPHENHYDRAMINE HCL 25 MG PO CAPS
25.0000 mg | ORAL_CAPSULE | Freq: Once | ORAL | Status: AC
  Administered 2020-06-16: 25 mg via ORAL
  Filled 2020-06-16: qty 1

## 2020-06-16 MED ORDER — SODIUM CHLORIDE 0.9% IV SOLUTION
250.0000 mL | Freq: Once | INTRAVENOUS | Status: AC
  Administered 2020-06-16: 250 mL via INTRAVENOUS
  Filled 2020-06-16: qty 250

## 2020-06-16 NOTE — Progress Notes (Signed)
Patient received prescribed treatment in clinic. 1 unit PRBC. Tolerated well. Patient stable at discharge. 

## 2020-06-16 NOTE — Telephone Encounter (Signed)
On 2/16-I called patient's son; left voicemail to call us back.  GB

## 2020-06-17 LAB — TYPE AND SCREEN
ABO/RH(D): A POS
Antibody Screen: NEGATIVE
Unit division: 0

## 2020-06-17 LAB — BPAM RBC
Blood Product Expiration Date: 202203142359
ISSUE DATE / TIME: 202202181059
Unit Type and Rh: 6200

## 2020-06-19 ENCOUNTER — Inpatient Hospital Stay: Payer: Medicare Other

## 2020-06-20 ENCOUNTER — Ambulatory Visit: Payer: Medicare Other

## 2020-06-20 ENCOUNTER — Other Ambulatory Visit: Payer: Medicare Other

## 2020-06-27 ENCOUNTER — Ambulatory Visit: Payer: Medicare Other | Attending: Pain Medicine | Admitting: Pain Medicine

## 2020-06-27 ENCOUNTER — Encounter: Payer: Self-pay | Admitting: Pain Medicine

## 2020-06-27 ENCOUNTER — Other Ambulatory Visit: Payer: Self-pay

## 2020-06-27 VITALS — BP 170/77 | HR 110 | Temp 97.6°F | Resp 18 | Ht 68.0 in | Wt 145.0 lb

## 2020-06-27 DIAGNOSIS — Z95828 Presence of other vascular implants and grafts: Secondary | ICD-10-CM | POA: Diagnosis not present

## 2020-06-27 DIAGNOSIS — R1013 Epigastric pain: Secondary | ICD-10-CM | POA: Diagnosis not present

## 2020-06-27 DIAGNOSIS — G893 Neoplasm related pain (acute) (chronic): Secondary | ICD-10-CM | POA: Insufficient documentation

## 2020-06-27 DIAGNOSIS — M792 Neuralgia and neuritis, unspecified: Secondary | ICD-10-CM

## 2020-06-27 DIAGNOSIS — M4854XS Collapsed vertebra, not elsewhere classified, thoracic region, sequela of fracture: Secondary | ICD-10-CM | POA: Insufficient documentation

## 2020-06-27 DIAGNOSIS — Z978 Presence of other specified devices: Secondary | ICD-10-CM

## 2020-06-27 DIAGNOSIS — C8300 Small cell B-cell lymphoma, unspecified site: Secondary | ICD-10-CM | POA: Insufficient documentation

## 2020-06-27 DIAGNOSIS — G8929 Other chronic pain: Secondary | ICD-10-CM

## 2020-06-27 DIAGNOSIS — C9110 Chronic lymphocytic leukemia of B-cell type not having achieved remission: Secondary | ICD-10-CM | POA: Insufficient documentation

## 2020-06-27 DIAGNOSIS — G894 Chronic pain syndrome: Secondary | ICD-10-CM | POA: Insufficient documentation

## 2020-06-27 DIAGNOSIS — Z79899 Other long term (current) drug therapy: Secondary | ICD-10-CM

## 2020-06-27 DIAGNOSIS — X58XXXS Exposure to other specified factors, sequela: Secondary | ICD-10-CM | POA: Insufficient documentation

## 2020-06-27 DIAGNOSIS — R109 Unspecified abdominal pain: Secondary | ICD-10-CM

## 2020-06-27 MED ORDER — OXYCODONE HCL 10 MG PO TABS
10.0000 mg | ORAL_TABLET | ORAL | 0 refills | Status: DC | PRN
Start: 2020-09-09 — End: 2020-11-25

## 2020-06-27 MED ORDER — GABAPENTIN 300 MG PO CAPS: ORAL_CAPSULE | ORAL | 2 refills | Status: DC

## 2020-06-27 MED ORDER — OXYCODONE HCL 10 MG PO TABS: 10.0000 mg | ORAL_TABLET | ORAL | 0 refills | Status: DC | PRN

## 2020-06-27 MED ORDER — OXYCODONE HCL 10 MG PO TABS
10.0000 mg | ORAL_TABLET | ORAL | 0 refills | Status: DC | PRN
Start: 2020-07-11 — End: 2020-08-29

## 2020-06-27 NOTE — Patient Instructions (Signed)
Opioid Overdose Opioids are drugs that are often used to treat pain. Opioids include illegal drugs, such as heroin, as well as prescription pain medicines, such as codeine, morphine, hydrocodone, oxycodone, and fentanyl. An opioid overdose happens when you take too much of an opioid. An overdose may be intentional or accidental and can happen with any type of opioid. The effects of an overdose can be mild, dangerous, or even deadly. Opioid overdose is a medical emergency. What are the causes? This condition may be caused by:  Taking too much of an opioid on purpose.  Taking too much of an opioid by accident.  Using two or more substances that contain opioids at the same time.  Taking an opioid with a substance that affects your heart, breathing, or blood pressure. These include alcohol, tranquilizers, sleeping pills, illegal drugs, and some over-the-counter medicines. This condition may also happen due to an error made by:  A health care provider who prescribes a medicine.  The pharmacist who fills the prescription order. What increases the risk? This condition is more likely in:  Children. They may be attracted to colorful pills. Because of a child's small size, even a small amount of a drug can be dangerous.  Older people. They may be taking many different drugs. Older people may have difficulty reading labels or remembering when they last took their medicine. They may also be more sensitive to the effects of opioids.  People with chronic medical conditions, especially heart, liver, kidney, or neurological diseases.  People who take an opioid for a long period of time.  People who use: ? Illegal drugs. IV heroin is especially dangerous. ? Other substances, including alcohol, while using an opioid.  People who have: ? A history of drug or alcohol abuse. ? Certain mental health conditions. ? A history of previous drug overdoses.  People who take opioids that are not prescribed  for them. What are the signs or symptoms? Symptoms of this condition depend on the type of opioid and the amount that was taken. Common symptoms include:  Sleepiness or difficulty waking from sleep.  Decrease in attention.  Confusion.  Slurred speech.  Slowed breathing and a slow pulse (bradycardia).  Nausea and vomiting.  Abnormally small pupils. Signs and symptoms that require emergency treatment include:  Cold, clammy, and pale skin.  Blue lips and fingernails.  Vomiting.  Gurgling sounds in the throat.  A pulse that is very slow or difficult to detect.  Breathing that is very irregular, slow, noisy, or difficult to detect.  Limp body.  Inability to respond to speech or be awakened from sleep (stupor).  Seizures. How is this diagnosed? This condition is diagnosed based on your symptoms and medical history. It is important to tell your health care provider:  About all of the opioids that you took.  When you took the opioids.  Whether you were drinking alcohol or using marijuana, cocaine, or other drugs. Your health care provider will do a physical exam. This exam may include:  Checking and monitoring your heart rate and rhythm, breathing rate, temperature, and blood pressure (vital signs).  Measuring oxygen levels in your blood.  Checking for abnormally small pupils. You may also have blood tests or urine tests. You may have X-rays if you are having severe breathing problems. How is this treated? This condition requires immediate medical treatment and hospitalization. Treatment is given in the hospital intensive care (ICU) setting. Supporting your vital signs and your breathing is the first step in   treating an opioid overdose. Treatment may also include:  Giving salts and minerals (electrolytes) along with fluids through an IV.  Inserting a breathing tube (endotracheal tube) in your airway to help you breathe if you cannot breathe on your own or you are in  danger of not being able to breathe on your own.  Giving oxygen through a small tube under your nose.  Passing a tube through your nose and into your stomach (nasogastric tube, or NG tube) to empty your stomach.  Giving medicines that: ? Increase your blood pressure. ? Relieve nausea and vomiting. ? Relieve abdominal pain and cramping. ? Reverse the effects of the opioid (naloxone).  Monitoring your heart and oxygen levels.  Ongoing counseling and mental health support if you intentionally overdosed or used an illegal drug. Follow these instructions at home: Medicines  Take over-the-counter and prescription medicines only as told by your health care provider.  Always ask your health care provider about possible side effects and interactions of any new medicine that you start taking.  Keep a list of all the medicines that you take, including over-the-counter medicines. Bring this list with you to all your medical visits. General instructions  Drink enough fluid to keep your urine pale yellow.  Keep all follow-up visits as told by your health care provider. This is important.   How is this prevented?  Read the drug inserts that come with your opioid pain medicines.  Take medicines only as told by your health care provider. Do not take more medicine than you are told. Do not take medicines more frequently than you are told.  Do not drink alcohol or take sedatives when taking opioids.  Do not use illegal or recreational drugs, including cocaine, ecstasy, and marijuana.  Do not take opioid medicines that are not prescribed for you.  Store all medicines in safety containers that are out of the reach of children.  Get help if you are struggling with: ? Alcohol or drug use. ? Depression or another mental health problem. ? Thoughts of hurting yourself or another person.  Keep the phone number of your local poison control center near your phone or in your mobile phone. In the  [place] the hotline of the National Poison Control Center is (800) 222-1222.  If you were prescribed naloxone, make sure you understand how to take it. Contact a health care provider if you:  Need help understanding how to take your pain medicines.  Feel your medicines are too strong.  Are concerned that your pain medicines are not working well for your pain.  Develop new symptoms or side effects when you are taking medicines. Get help right away if:  You or someone else is having symptoms of an opioid overdose. Get help even if you are not sure.  You have serious thoughts about hurting yourself or others.  You have: ? Chest pain. ? Difficulty breathing. ? A loss of consciousness. These symptoms may represent a serious problem that is an emergency. Do not wait to see if the symptoms will go away. Get medical help right away. Call your local emergency services (911 in the [place] Do not drive yourself to the hospital. If you ever feel like you may hurt yourself or others, or have thoughts about taking your own life, get help right away. You can go to your nearest emergency department or call:  Your local emergency services (911 in the [place]  A suicide crisis helpline, such as the National Suicide Prevention Lifeline   at 1-800-273-8255. This is open 24 hours a day. Summary  Opioids are drugs that are often used to treat pain. Opioids include illegal drugs, such as heroin, as well as prescription pain medicines.  An opioid overdose happens when you take too much of an opioid.  Overdoses can be intentional or accidental.  Opioid overdose is very dangerous. It is a life-threatening emergency.  If you or someone you know is experiencing an opioid overdose, get help right away. This information is not intended to replace advice given to you by your health care provider. Make sure you discuss any questions you have with your health care provider. Document Revised: 04/02/2018 Document  Reviewed: 04/02/2018 [employer] Patient Education  2021 [employer]  

## 2020-06-27 NOTE — Progress Notes (Signed)
Nursing Pain Medication Assessment:  Safety precautions to be maintained throughout the outpatient stay will include: orient to surroundings, keep bed in low position, maintain call bell within reach at all times, provide assistance with transfer out of bed and ambulation.  Medication Inspection Compliance: Pill count conducted under aseptic conditions, in front of the patient. Neither the pills nor the bottle was removed from the patient's sight at any time. Once count was completed pills were immediately returned to the patient in their original bottle.  Medication: Oxycodone IR Pill/Patch Count: 90 of 180 pills remain Pill/Patch Appearance: Markings consistent with prescribed medication Bottle Appearance: Standard pharmacy container. Clearly labeled. Filled Date:02 / 14 / 2022 Last Medication intake:  Today

## 2020-06-27 NOTE — Progress Notes (Signed)
PROVIDER NOTE: Information contained herein reflects review and annotations entered in association with encounter. Interpretation of such information and data should be left to medically-trained personnel. Information provided to patient can be located elsewhere in the medical record under "Patient Instructions". Document created using STT-dictation technology, any transcriptional errors that may result from process are unintentional.    Patient: James [name]  Service Category: Procedure  Provider: Gaspar Cola, MD  DOB: 04/17/1951  DOS: 06/27/2020  Location: [facility]  MRN: [id number]  Setting: Ambulatory - outpatient  Referring Provider: [name], MD  Type: Established Patient  Specialty: Interventional Pain Management  PCP: [name], MD   Primary Reason for Visit: Interventional Pain Management Treatment. CC: Abdominal Pain  Procedure:          Intrathecal Drug Delivery System (IDDS):  Type: Reservoir Refill [phone]) No rate change Region: Abdominal Laterality: Left  Type of Pump: Medtronic Synchromed II Delivery Route: Intrathecal Type of Pain Treated: Neuropathic/Nociceptive Primary Medication Class: Opioid/opiate  Medication, Concentration, Infusion Program, & Delivery Rate: Please see scanned programming printout.   Indications: 1. Chronic pain syndrome   2. Chronic epigastric pain (1ry area of Pain)   3. Chronic abdominal pain   4. Cancer-related pain   5. CLL (chronic lymphocytic leukemia) ([place])   6. Malignant lymphoma, small lymphocytic (HCC)   7. Abdominal wall pain in epigastric region   8. Non-traumatic compression fracture of T2 thoracic vertebra, sequela   9. Non-traumatic compression fracture of T1 thoracic vertebra, sequela   10. Presence of implanted infusion pump (Medtronic)   11. Presence of intrathecal pump (Medtronic)   12. Pharmacologic therapy    Pain Assessment: Self-Reported Pain Score: 4 /10             Reported  level is compatible with observation.        We are pending to do a catheter study to determine if the catheter remains within the intrathecal space or if it has migrated to the epidural space.  In addition, we are also considering a splanchnic nerve alcohol neurolysis.  Unfortunately, the patient's wife had suffered a stroke and he has been unable to keep his appointments.  Pharmacotherapy Assessment  Analgesic: Oxycodone IR 13m, 1 tab PO q 4 hrs (60 mg/day of oxycodone IR) (90 MME) Highest recorded MME/day: 150 mg/day 90 day average MME/day: 632.27 mg/day, according to PMP   Monitoring: [place] PMP: PDMP reviewed during this encounter.       Pharmacotherapy: No side-effects or adverse reactions reported. Compliance: No problems identified. Effectiveness: Clinically acceptable. Plan: Refer to "POC".  UDS: No results found for: SUMMARY  Intrathecal Pump Therapy Assessment  Manufacturer: Medtronic Synchromed Type: Programmable Volume: 40 mL reservoir MRI compatibility: Yes   Drug content:  Primary Medication Class:Opioid Primary Medication:PF-Fentanyl(2000 mcg/mL) Secondary Medication:PF-Bupivacaine(20 mg/mL) Other Medication:None  PTM(PCA) mode: PTM dose:25 mcg bolus Duration of bolus:1 minute Lockout interval:4hours Maximum 24-hour doses:6   Programming:  Type: Simple continuous. See pump readout for details.   Changes:  Medication Change: None at this point Rate Change: No change in rate  Reported side-effects or adverse reactions: None reported  Effectiveness: Described as relatively effective, allowing for increase in activities of daily living (ADL) Clinically meaningful improvement in function (CMIF): Sustained CMIF goals met  Plan: Pump refill today  Pre-op H&P Assessment:  Mr. JVictoriois a 70y.o. (year old), male patient, seen today for interventional treatment. He  has a past surgical history that includes Cholecystectomy (1983);  Esophagogastroduodenoscopy (egd) with propofol (N/A, 11/20/2016); Cataract extraction w/ intraocular lens implant (Bilateral); Lower Extremity Angiography (Right, 05/19/2017); Esophagogastroduodenoscopy (egd) with propofol (N/A, 10/27/2017); Colonoscopy with propofol (N/A, 10/27/2017); Lower Extremity Angiography (Left, 08/10/2018); and Intrathecal pump implant (Left, 02/07/2020). James [name] has a current medication list which includes the following prescription(s): atorvastatin, clopidogrel, ensure, folic acid, metformin, methocarbamol, naloxegol oxalate, naloxone, pantoprazole, [START ON 07/31/2020] gabapentin, [START ON 07/11/2020] oxycodone hcl, [START ON 08/10/2020] oxycodone hcl, and [START ON 09/09/2020] oxycodone hcl. His primarily concern today is the Abdominal Pain  Initial Vital Signs:  Pulse/HCG Rate: (!) 110  Temp: 97.6 F (36.4 C) Resp: 18 BP: (!) 170/77 SpO2: 100 %  BMI: Estimated body mass index is 22.05 kg/m as calculated from the following:   Height as of this encounter: 5' 8"  (1.727 m).   Weight as of this encounter: 145 lb (65.8 kg).  Risk Assessment: Allergies: Reviewed. He has No Known Allergies.  Allergy Precautions: None required Coagulopathies: Reviewed. None identified.  Blood-thinner therapy: None at this time Active Infection(s): Reviewed. None identified. James [name] is afebrile  Site Confirmation: James [name] was asked to confirm the procedure and laterality before marking the site Procedure checklist: Completed Consent: Before the procedure and under the influence of no sedative(s), amnesic(s), or anxiolytics, the patient was informed of the treatment options, risks and possible complications. To fulfill our ethical and legal obligations, as recommended by the American Medical Association's Code of Ethics, I have informed the patient of my clinical impression; the nature and purpose of the treatment or procedure; the risks, benefits, and possible complications of the  intervention; the alternatives, including doing nothing; the risk(s) and benefit(s) of the alternative treatment(s) or procedure(s); and the risk(s) and benefit(s) of doing nothing.  James [name] was provided with information about the general risks and possible complications associated with most interventional procedures. These include, but are not limited to: failure to achieve desired goals, infection, bleeding, organ or nerve damage, allergic reactions, paralysis, and/or death.  In addition, he was informed of those risks and possible complications associated to this particular procedure, which include, but are not limited to: damage to the implant; failure to decrease pain; local, systemic, or serious CNS infections, intraspinal abscess with possible cord compression and paralysis, or life-threatening such as meningitis; bleeding; organ damage; nerve injury or damage with subsequent sensory, motor, and/or autonomic system dysfunction, resulting in transient or permanent pain, numbness, and/or weakness of one or several areas of the body; allergic reactions, either minor or major life-threatening, such as anaphylactic or anaphylactoid reactions.  Furthermore, Mr. Cullipher was informed of those risks and complications associated with the medications. These include, but are not limited to: allergic reactions (i.e.: anaphylactic or anaphylactoid reactions); endorphine suppression; bradycardia and/or hypotension; water retention and/or peripheral vascular relaxation leading to lower extremity edema and possible stasis ulcers; respiratory depression and/or shortness of breath; decreased metabolic rate leading to weight gain; swelling or edema; medication-induced neural toxicity; particulate matter embolism and blood vessel occlusion with resultant organ, and/or nervous system infarction; and/or intrathecal granuloma formation with possible spinal cord compression and permanent paralysis.  Before refilling the pump  Mr. Mohs was informed that some of the medications used in the devise may not be FDA approved for such use and therefore it constitutes an off-label use of the medications.  Finally, he was informed that Medicine is not an exact science; therefore, there is also the possibility of unforeseen or unpredictable risks and/or possible complications that may result in a catastrophic  outcome. The patient indicated having understood very clearly. We have given the patient no guarantees and we have made no promises. Enough time was given to the patient to ask questions, all of which were answered to the patient's satisfaction. Mr. Fandrich has indicated that he wanted to continue with the procedure. Attestation: I, the ordering provider, attest that I have discussed with the patient the benefits, risks, side-effects, alternatives, likelihood of achieving goals, and potential problems during recovery for the procedure that I have provided informed consent. Date  Time: 06/27/2020  1:06 PM  Pre-Procedure Preparation:  Monitoring: As per clinic protocol. Respiration, ETCO2, SpO2, BP, heart rate and rhythm monitor placed and checked for adequate function Safety Precautions: Patient was assessed for positional comfort and pressure points before starting the procedure. Time-out: I initiated and conducted the "Time-out" before starting the procedure, as per protocol. The patient was asked to participate by confirming the accuracy of the "Time Out" information. Verification of the correct person, site, and procedure were performed and confirmed by me, the nursing staff, and the patient. "Time-out" conducted as per Joint Commission's Universal Protocol (UP.01.01.01). Time: 1323  Description of Procedure:          Position: Supine Target Area: Central-port of intrathecal pump. Approach: Anterior, 90 degree angle approach. Area Prepped: Entire Area around the pump implant. DuraPrep (Iodine Povacrylex [0.7% available  iodine] and Isopropyl Alcohol, 74% w/w) Safety Precautions: Aspiration looking for blood return was conducted prior to all injections. At no point did we inject any substances, as a needle was being advanced. No attempts were made at seeking any paresthesias. Safe injection practices and needle disposal techniques used. Medications properly checked for expiration dates. SDV (single dose vial) medications used. Description of the Procedure: Protocol guidelines were followed. Two nurses trained to do implant refills were present during the entire procedure. The refill medication was checked by both healthcare providers as well as the patient. The patient was included in the "Time-out" to verify the medication. The patient was placed in position. The pump was identified. The area was prepped in the usual manner. The sterile template was positioned over the pump, making sure the side-port location matched that of the pump. Both, the pump and the template were held for stability. The needle provided in the Medtronic Kit was then introduced thru the center of the template and into the central port. The pump content was aspirated and discarded volume documented. The new medication was slowly infused into the pump, thru the filter, making sure to avoid overpressure of the device. The needle was then removed and the area cleansed, making sure to leave some of the prepping solution back to take advantage of its long term bactericidal properties. The pump was interrogated and programmed to reflect the correct medication, volume, and dosage. The program was printed and taken to the physician for approval. Once checked and signed by the physician, a copy was provided to the patient and another scanned into the EMR. Vitals:   06/27/20 1305  BP: (!) 170/77  Pulse: (!) 110  Resp: 18  Temp: 97.6 F (36.4 C)  SpO2: 100%  Weight: 145 lb (65.8 kg)  Height: 5' 8"  (1.727 m)    Start Time: 1323 hrs. End Time: 1328  hrs. Materials & Medications: Medtronic Refill Kit Medication(s): Please see chart orders for details.  Imaging Guidance:          Type of Imaging Technique: None used Indication(s): N/A Exposure Time: No patient exposure Contrast: None  used. Fluoroscopic Guidance: N/A Ultrasound Guidance: N/A Interpretation: N/A  Antibiotic Prophylaxis:   Anti-infectives (From admission, onward)   None     Indication(s): None identified  Post-operative Assessment:  Post-procedure Vital Signs:  Pulse/HCG Rate: (!) 110  Temp: 97.6 F (36.4 C) Resp: 18 BP: (!) 170/77 SpO2: 100 %  EBL: None  Complications: No immediate post-treatment complications observed by team, or reported by patient.  Note: The patient tolerated the entire procedure well. A repeat set of vitals were taken after the procedure and the patient was kept under observation following institutional policy, for this type of procedure. Post-procedural neurological assessment was performed, showing return to baseline, prior to discharge. The patient was provided with post-procedure discharge instructions, including a section on how to identify potential problems. Should any problems arise concerning this procedure, the patient was given instructions to immediately contact us, at any time, without hesitation. In any case, we plan to contact the patient by telephone for a follow-up status report regarding this interventional procedure.  Comments:  No additional relevant information.  Plan of Care  Orders:  Orders Placed This Encounter  Procedures  . PUMP REFILL    Maintain Protocol by having two(2) healthcare providers during procedure and programming.    Scheduling Instructions:     Please refill intrathecal pump today.    Order Specific Question:   Where will this procedure be performed?    Answer:   [facility]  . PUMP REFILL    Whenever possible schedule on a procedure today.    Standing Status:   Future     Standing Expiration Date:   11/24/2020    Scheduling Instructions:     Please schedule intrathecal pump refill based on pump programming. Avoid schedule intervals of more than 120 days (4 months).    Order Specific Question:   Where will this procedure be performed?    Answer:   [facility]  . Informed Consent Details: Physician/Practitioner Attestation; Transcribe to consent form and obtain patient signature    Transcribe to consent form and obtain patient signature.    Order Specific Question:   Physician/Practitioner attestation of informed consent for procedure/surgical case    Answer:   I, the physician/practitioner, attest that I have discussed with the patient the benefits, risks, side effects, alternatives, likelihood of achieving goals and potential problems during recovery for the procedure that I have provided informed consent.    Order Specific Question:   Procedure    Answer:   Intrathecal pump refill    Order Specific Question:   Physician/Practitioner performing the procedure    Answer:   Attending Physician: Kathlen Brunswick. Dossie Arbour, MD & designated trained staff    Order Specific Question:   Indication/Reason    Answer:   Chronic Pain Syndrome (G89.4), presence of an intrathecal pump (Z97.8)   Chronic Opioid Analgesic:  Oxycodone IR 34m, 1 tab PO q 4 hrs (60 mg/day of oxycodone IR) (90 MME) + morphine ER 30 mg twice daily (60 mg/day of morphine ER) (60 MME) (150 MME/day). Highest recorded MME/day: 150 mg/day 90 day average MME/day: 77.89 mg/day   Medications ordered for procedure: Meds ordered this encounter  Medications  . gabapentin (NEURONTIN) 300 MG capsule    Sig: Take 3 capsules (900 mg total) by mouth at bedtime AND 1 capsule (300 mg total) 2 (two) times daily.    Dispense:  150 capsule    Refill:  2    Fill one day early if pharmacy  is closed on scheduled refill date. May substitute for generic if available.  . Oxycodone HCl 10 MG TABS    Sig: Take 1 tablet  (10 mg total) by mouth every 4 (four) hours as needed. Must last 30 days    Dispense:  180 tablet    Refill:  0    Chronic Pain: STOP Act (Not applicable) Fill 1 day early if closed on refill date. Avoid benzodiazepines within 8 hours of opioids  . Oxycodone HCl 10 MG TABS    Sig: Take 1 tablet (10 mg total) by mouth every 4 (four) hours as needed. Must last 30 days    Dispense:  180 tablet    Refill:  0    Chronic Pain: STOP Act (Not applicable) Fill 1 day early if closed on refill date. Avoid benzodiazepines within 8 hours of opioids  . Oxycodone HCl 10 MG TABS    Sig: Take 1 tablet (10 mg total) by mouth every 4 (four) hours as needed. Must last 30 days    Dispense:  180 tablet    Refill:  0    Chronic Pain: STOP Act (Not applicable) Fill 1 day early if closed on refill date. Avoid benzodiazepines within 8 hours of opioids   Medications administered: Rameses F. Dimascio had no medications administered during this visit.  See the medical record for exact dosing, route, and time of administration.  Follow-up plan:   Return for Pump Refill (Max:23mo.       Interventional Therapies  Risk  Complexity Considerations:   NOTE: PLAVIX ANTICOAGULATION  (Stop: 7 days  Restart: 2 hrs)   Planned  Pending:   Therapeutic bilateral splanchnic nerves neurolysis #1 with alcohol  Diagnostic intrathecal catheter test under fluoroscopic guidance to determine location    Under consideration:   Possible revision of intrathecal catheter    Completed:   Diagnostic bilateral celiac plexus block x2 (06/01/2019; 11/02/2019) (50/30/30/0) (75/75/0/0)  Therapeutic bilateral celiac plexus neurolysis with phenol x1 (04/06/2020)  Diagnostic bilateral rectus abdominis trigger point injection x1 (04/06/2020) (0/0/0)  Diagnostic (ML) T12-L1 intrathecal injection of PF-fentanyl 25 mcg as an intrathecal pump trial x1 (12/14/2019)   Therapeutic  Palliative (PRN) options:   Intrathecal pump management    Recent  Visits Date Type Provider Dept  06/01/20 Procedure visit NMilinda Pointer MD [facility]  05/04/20 Procedure visit NMilinda Pointer MD [facility]  05[facility]01[facility] 04/18/20 Office Visit NMilinda Pointer MD [facility]  04/11/20 Office Visit NMilinda Pointer MD [facility]  04/06/20 Procedure visit NMilinda Pointer MD [facility]  03/29/20 Procedure visit NMilinda Pointer MD [facility]  Showing recent visits within past 90 days and meeting all other requirements Today's Visits Date Type Provider Dept  06/27/20 Procedure visit NMilinda Pointer MD [facility]  Showing today's visits and meeting all other requirements Future Appointments Date Type Provider Dept  07/27/20 Appointment NMilinda Pointer MD [facility]  Showing future appointments within next 90 days and meeting all other requirements  Disposition: Discharge home  Discharge (Date  Time): 06/27/2020; 1333 hrs.   Primary Care Physician: HTracie Harrier MD [facility] Note by: FGaspar Cola MD Date: 06/27/2020; Time: 1:48 PM  Disclaimer:  Medicine is not an eChief Strategy Officer The only guarantee in medicine is that nothing is guaranteed. It is important to note that the decision to proceed with this intervention was based on the information  collected from the patient. The Data and conclusions were drawn from the patient's questionnaire, the interview, and the physical examination. Because the information was provided in large part by the patient, it cannot be guaranteed that it has not been purposely or unconsciously manipulated. Every effort has been made to obtain as much relevant data as possible for this evaluation. It is important to note that the conclusions that lead to this procedure are derived in large part from the available  data. Always take into account that the treatment will also be dependent on availability of resources and existing treatment guidelines, considered by other Pain Management Practitioners as being common knowledge and practice, at the time of the intervention. For Medico-Legal purposes, it is also important to point out that variation in procedural techniques and pharmacological choices are the acceptable norm. The indications, contraindications, technique, and results of the above procedure should only be interpreted and judged by a Board-Certified Interventional Pain Specialist with extensive familiarity and expertise in the same exact procedure and technique.

## 2020-06-28 ENCOUNTER — Telehealth: Payer: Self-pay

## 2020-06-28 NOTE — Telephone Encounter (Signed)
Post IT pump refill.  Patient states he is doing good.

## 2020-07-04 ENCOUNTER — Inpatient Hospital Stay: Payer: Medicare Other | Attending: Internal Medicine

## 2020-07-04 ENCOUNTER — Inpatient Hospital Stay: Payer: Medicare Other

## 2020-07-04 ENCOUNTER — Other Ambulatory Visit: Payer: Self-pay

## 2020-07-04 VITALS — BP 112/61 | HR 97

## 2020-07-04 DIAGNOSIS — D631 Anemia in chronic kidney disease: Secondary | ICD-10-CM | POA: Diagnosis not present

## 2020-07-04 DIAGNOSIS — N189 Chronic kidney disease, unspecified: Secondary | ICD-10-CM | POA: Diagnosis not present

## 2020-07-04 DIAGNOSIS — C9110 Chronic lymphocytic leukemia of B-cell type not having achieved remission: Secondary | ICD-10-CM

## 2020-07-04 DIAGNOSIS — N183 Chronic kidney disease, stage 3 unspecified: Secondary | ICD-10-CM

## 2020-07-04 LAB — HEMOGLOBIN AND HEMATOCRIT, BLOOD
HCT: 22.3 % — ABNORMAL LOW (ref 39.0–52.0)
Hemoglobin: 7.2 g/dL — ABNORMAL LOW (ref 13.0–17.0)

## 2020-07-04 MED ORDER — DARBEPOETIN ALFA 300 MCG/0.6ML IJ SOSY
300.0000 ug | PREFILLED_SYRINGE | Freq: Once | INTRAMUSCULAR | Status: AC
  Administered 2020-07-04: 300 ug via SUBCUTANEOUS
  Filled 2020-07-04: qty 0.6

## 2020-07-10 ENCOUNTER — Telehealth: Payer: Self-pay | Admitting: *Deleted

## 2020-07-10 NOTE — Telephone Encounter (Signed)
Patient's son called to confirm pet scan apts.   RN also Discussed pet scan prep.Marland Kitchen Son will have patient keep his sch. Apt.

## 2020-07-11 ENCOUNTER — Inpatient Hospital Stay: Payer: Medicare Other | Admitting: Hospice and Palliative Medicine

## 2020-07-11 ENCOUNTER — Ambulatory Visit
Admission: RE | Admit: 2020-07-11 | Discharge: 2020-07-11 | Disposition: A | Discharge status: Home / Self Care | Payer: Medicare Other | Source: Ambulatory Visit | Attending: Internal Medicine | Admitting: Internal Medicine

## 2020-07-11 ENCOUNTER — Other Ambulatory Visit: Payer: Self-pay

## 2020-07-11 DIAGNOSIS — C9110 Chronic lymphocytic leukemia of B-cell type not having achieved remission: Secondary | ICD-10-CM | POA: Diagnosis present

## 2020-07-11 DIAGNOSIS — Z5181 Encounter for therapeutic drug level monitoring: Secondary | ICD-10-CM | POA: Diagnosis not present

## 2020-07-11 DIAGNOSIS — R590 Localized enlarged lymph nodes: Secondary | ICD-10-CM | POA: Insufficient documentation

## 2020-07-11 LAB — GLUCOSE, CAPILLARY: Glucose-Capillary: 76 mg/dL (ref 70–99)

## 2020-07-11 MED ORDER — PAIN MANAGEMENT IT PUMP REFILL: 1.0000 | Freq: Once | INTRATHECAL | 0 refills | Status: AC

## 2020-07-11 MED ORDER — FLUDEOXYGLUCOSE F - 18 (FDG) INJECTION
7.5000 | Freq: Once | INTRAVENOUS | Status: AC | PRN
  Administered 2020-07-11: 7.79 via INTRAVENOUS

## 2020-07-13 ENCOUNTER — Encounter: Payer: Medicare Other | Admitting: Pain Medicine

## 2020-07-13 MED FILL — Medication: INTRATHECAL | Qty: 1 | Status: AC

## 2020-07-17 ENCOUNTER — Other Ambulatory Visit: Payer: Self-pay | Admitting: *Deleted

## 2020-07-17 DIAGNOSIS — C9110 Chronic lymphocytic leukemia of B-cell type not having achieved remission: Secondary | ICD-10-CM

## 2020-07-17 DIAGNOSIS — D631 Anemia in chronic kidney disease: Secondary | ICD-10-CM

## 2020-07-17 DIAGNOSIS — N183 Chronic kidney disease, stage 3 unspecified: Secondary | ICD-10-CM

## 2020-07-17 DIAGNOSIS — D649 Anemia, unspecified: Secondary | ICD-10-CM

## 2020-07-18 ENCOUNTER — Other Ambulatory Visit: Payer: Self-pay | Admitting: *Deleted

## 2020-07-18 ENCOUNTER — Inpatient Hospital Stay: Payer: Medicare Other

## 2020-07-18 ENCOUNTER — Other Ambulatory Visit: Payer: Self-pay

## 2020-07-18 ENCOUNTER — Inpatient Hospital Stay (HOSPITAL_BASED_OUTPATIENT_CLINIC_OR_DEPARTMENT_OTHER): Payer: Medicare Other | Admitting: Internal Medicine

## 2020-07-18 DIAGNOSIS — N183 Chronic kidney disease, stage 3 unspecified: Secondary | ICD-10-CM

## 2020-07-18 DIAGNOSIS — N189 Chronic kidney disease, unspecified: Secondary | ICD-10-CM | POA: Diagnosis not present

## 2020-07-18 DIAGNOSIS — D631 Anemia in chronic kidney disease: Secondary | ICD-10-CM

## 2020-07-18 DIAGNOSIS — C9110 Chronic lymphocytic leukemia of B-cell type not having achieved remission: Secondary | ICD-10-CM | POA: Diagnosis not present

## 2020-07-18 DIAGNOSIS — D649 Anemia, unspecified: Secondary | ICD-10-CM

## 2020-07-18 LAB — PREPARE RBC (CROSSMATCH)

## 2020-07-18 LAB — COMPREHENSIVE METABOLIC PANEL
ALT: 9 U/L (ref 0–44)
AST: 12 U/L — ABNORMAL LOW (ref 15–41)
Albumin: 4.8 g/dL (ref 3.5–5.0)
Alkaline Phosphatase: 77 U/L (ref 38–126)
Anion gap: 11 (ref 5–15)
BUN: 19 mg/dL (ref 8–23)
CO2: 24 mmol/L (ref 22–32)
Calcium: 9.2 mg/dL (ref 8.9–10.3)
Chloride: 103 mmol/L (ref 98–111)
Creatinine, Ser: 1.25 mg/dL — ABNORMAL HIGH (ref 0.61–1.24)
GFR, Estimated: >60 mL/min (ref >60)
Glucose, Bld: 109 mg/dL — ABNORMAL HIGH (ref 70–99)
Potassium: 3.9 mmol/L (ref 3.5–5.1)
Sodium: 138 mmol/L (ref 135–145)
Total Bilirubin: 1 mg/dL (ref 0.3–1.2)
Total Protein: 7.4 g/dL (ref 6.5–8.1)

## 2020-07-18 LAB — IRON AND TIBC
Iron: 195 ug/dL — ABNORMAL HIGH (ref 45–182)
Saturation Ratios: 50 % — ABNORMAL HIGH (ref 17.9–39.5)
TIBC: 389 ug/dL (ref 250–450)
UIBC: 194 ug/dL

## 2020-07-18 LAB — CBC WITH DIFFERENTIAL/PLATELET
Abs Immature Granulocytes: 0.03 10*3/uL (ref 0.00–0.07)
Basophils Absolute: 0.1 10*3/uL (ref 0.0–0.1)
Basophils Relative: 1 %
Eosinophils Absolute: 0 10*3/uL (ref 0.0–0.5)
Eosinophils Relative: 1 %
HCT: 20.7 % — ABNORMAL LOW (ref 39.0–52.0)
Hemoglobin: 6.6 g/dL — ABNORMAL LOW (ref 13.0–17.0)
Immature Granulocytes: 0 %
Lymphocytes Relative: 45 %
Lymphs Abs: 3.5 10*3/uL (ref 0.7–4.0)
MCH: 35.7 pg — ABNORMAL HIGH (ref 26.0–34.0)
MCHC: 31.9 g/dL (ref 30.0–36.0)
MCV: 111.9 fL — ABNORMAL HIGH (ref 80.0–100.0)
Monocytes Absolute: 2.2 10*3/uL — ABNORMAL HIGH (ref 0.1–1.0)
Monocytes Relative: 28 %
Neutro Abs: 2 10*3/uL (ref 1.7–7.7)
Neutrophils Relative %: 25 %
Platelets: 253 10*3/uL (ref 150–400)
RBC: 1.85 MIL/uL — ABNORMAL LOW (ref 4.22–5.81)
RDW: 19.6 % — ABNORMAL HIGH (ref 11.5–15.5)
Smear Review: NORMAL
WBC: 7.8 10*3/uL (ref 4.0–10.5)
nRBC: 0.3 % — ABNORMAL HIGH (ref 0.0–0.2)

## 2020-07-18 LAB — FERRITIN: Ferritin: 144 ng/mL (ref 24–336)

## 2020-07-18 LAB — LACTATE DEHYDROGENASE: LDH: 109 U/L (ref 98–192)

## 2020-07-18 MED ORDER — METFORMIN HCL 1000 MG PO TABS: 1000.0000 mg | ORAL_TABLET | Freq: Every day | ORAL | 3 refills | Status: DC

## 2020-07-18 MED ORDER — MIRTAZAPINE 30 MG PO TABS: 30.0000 mg | ORAL_TABLET | Freq: Every day | ORAL | 3 refills | Status: DC

## 2020-07-18 MED ORDER — DARBEPOETIN ALFA 300 MCG/0.6ML IJ SOSY
300.0000 ug | PREFILLED_SYRINGE | Freq: Once | INTRAMUSCULAR | Status: AC
  Administered 2020-07-18: 300 ug via SUBCUTANEOUS
  Filled 2020-07-18: qty 0.6

## 2020-07-18 NOTE — Assessment & Plan Note (Addendum)
#  CLL/SLL- relapsed most recently s/p Gazyva x6 cycles [AUG 2021] 15th MARCH 2022- No evidence of active lymphoma by FDG PET scan; Stable mild mediastinal adenopathy and moderate periaortic retroperitoneal adenopathy. No significant metabolic activity in these nodes.  # worsening severe anemia-hemoglobin 6.6-etiology is unclear.  Previous bone marrow biopsy 2019 October-no etiology noted.  Given the worsening anemia will recommend repeating bone marrow biopsy.  Will discuss further.  Proceed with PRBC transfusion today.  #Chronic abdominal pain-unclear etiology- STABLE.currently being titrated ; on pain pump as per pain management.   # weight loss/depression; declines nutrition; plan remeron 30 mg qhs.   #Hyperkalemia-5.1; STABLE today- /CKD-III  [baseline 1.4; Dr.Lateef]; STABLE; continue LoKelma as per Nephrology.   # DISPOSITION: # Aranesp today # 3/25- 1 unit PRBC transfusion # follow up TBD- Dr.B

## 2020-07-18 NOTE — Progress Notes (Signed)
Should there cone [place] OFFICE PROGRESS NOTE  Patient Care Team: [name], MD as PCP - General (Internal Medicine) Cammie Sickle, MD as Consulting Physician (Hematology and Oncology) Borders, Kirt Boys, NP as Nurse Practitioner (Hospice and Palliative Medicine) Verlon Au, NP as Nurse Practitioner (Hematology and Oncology) Jacquelin Hawking, NP as Nurse Practitioner (Oncology) Lonell Face, NP as Nurse Practitioner (Neurosurgery) Milinda Pointer, MD as Consulting Physician (Pain Medicine)  Cancer Staging No matching staging information was found for the patient.   Oncology History Overview Note  # 2006- CLL STAGE IV; MAY 2011- WBC- 57K;Platelets-99;Hb-12/CT Bulky LN; BMBx- 80% Invol; del 11; START Benda-Ritux x4 [finished Sep 2011];   # July 2015-Progression; Sep 2015-START ibrutinib; CT scan DEC 2015- Improvement LN; Cont Ibrutinib $RemoveBefore'140mg'lHbvjnrKxJDIn$ /d; NOV 2016 CT- 1-2CM LN [mild progression compared to Dec 2015];NOV 2016- FISH peripheral blood- NO MUTATIONS/CD-38 Positive; NOV 7th- CONT IBRUTINIB 2 pills/day; CT AUG 2017- STABLE;  DEC 6th PET- Mild RP LN/ Retrocrural LN  # OFF ibrutinib [? intol]- sep 2019- Jan 16th 2020; Re-start Ibrutinib; September 2020-stop ibrutinib [poor tolerance/worsening anemia]  # Jan 16th 2020- start aranesp; HOLD while on [place].   # MARCH 11th 2021[name]   # SEP 2021- prostate enlargement on CT scan- UA-NEG; PSA- WNL; s/p Dr.Wolff.    # DEC 2019- PAIN CONTRACT  # PALLIATIVE CARE- 06/21/2019-  # October 2019-bone marrow biopsy [worsening anemia]-question dyserythropoietic changes/small clone of CLL;  # [facility] HEM- NEG.  SA skin infection [Oct 2016] s/p clinda -------------------------------------------------------    DIAGNOSIS: CLL  STAGE:   IV      ;GOALS: palliative  CURRENT/MOST RECENT THERAPY : Gazyva [C]   CLL (chronic lymphocytic leukemia) ([place])  07/08/2019 -  Chemotherapy   The patient had  obinutuzumab (GAZYVA) 100 mg in sodium chloride 0.9 % 100 mL (0.9615 mg/mL) chemo infusion, 100 mg, Intravenous, Once, 6 of 6 cycles Administration: 100 mg (07/08/2019), 900 mg (07/09/2019), 1,000 mg (07/26/2019), 1,000 mg (08/16/2019), 1,000 mg (08/02/2019), 1,000 mg (09/13/2019), 1,000 mg (10/11/2019), 1,000 mg (11/09/2019), 1,000 mg (12/07/2019)  for chemotherapy treatment.      INTERVAL HISTORY:  James [name] 70 y.o.  male pleasant patient above history of CLL and chronic abdominal pain of unclear etiology; severe anemia of pain unclear etiology is here today with results of his CT scan.  Patient continues to feel poorly.  Continues to have chronic abdominal pain.  Chronic mild nausea.  No vomiting.  Poor appetite.  Complains of shortness of breath on exertion.  Review of Systems  Constitutional: Positive for malaise/fatigue. Negative for chills, diaphoresis and fever.  HENT: Negative for nosebleeds and sore throat.   Eyes: Negative for double vision.  Respiratory: Positive for shortness of breath. Negative for cough, hemoptysis, sputum production and wheezing.   Cardiovascular: Negative for chest pain, palpitations, orthopnea and leg swelling.  Gastrointestinal: Positive for abdominal pain, constipation and nausea. Negative for blood in stool, diarrhea, heartburn, melena and vomiting.  Genitourinary: Negative for dysuria, frequency and urgency.  Skin: Negative.  Negative for itching and rash.  Neurological: Negative for dizziness, tingling, focal weakness, weakness and headaches.  Endo/Heme/Allergies: Does not bruise/bleed easily.  Psychiatric/Behavioral: Negative for depression. The patient is not nervous/anxious and does not have insomnia.       PAST MEDICAL HISTORY :  Past Medical History:  Diagnosis Date  . CLL (chronic lymphocytic leukemia) ([place])   . Constipation   . Depression   . Diabetes mellitus without complication ([place])   .  GERD (gastroesophageal reflux disease)   .  Hematuria   . Hyperlipidemia   . Hypertension   . Therapeutic opioid induced constipation     PAST SURGICAL HISTORY :   Past Surgical History:  Procedure Laterality Date  . CATARACT EXTRACTION W/ INTRAOCULAR LENS IMPLANT Bilateral   . CHOLECYSTECTOMY  1983  . COLONOSCOPY WITH PROPOFOL N/A 10/27/2017   Procedure: COLONOSCOPY WITH PROPOFOL;  Surgeon: Manya Silvas, MD;  Location[facility] ENDOSCOPY;  Service: Endoscopy;  Laterality: N/A;  . ESOPHAGOGASTRODUODENOSCOPY (EGD) WITH PROPOFOL N/A 11/20/2016   Procedure: ESOPHAGOGASTRODUODENOSCOPY (EGD) WITH PROPOFOL;  Surgeon: Manya Silvas, MD;  Location: [facility] ENDOSCOPY;  Service: Endoscopy;  Laterality: N/A;  . ESOPHAGOGASTRODUODENOSCOPY (EGD) WITH PROPOFOL N/A 10/27/2017   Procedure: ESOPHAGOGASTRODUODENOSCOPY (EGD) WITH PROPOFOL;  Surgeon: Manya Silvas, MD;  Location: [facility] END[facility]COPY;  Service: Endoscopy;  Laterality: N/A;  . INTRATHECAL PUMP IMPLANT Left 02/07/2020   Procedure: INTRATHECAL PUMP & CATHETER IMPLANT;  Surgeon: Deetta Perla, MD;  Location: [facility] ORS;  Service: Neurosurgery;  Laterality: Left;  . LOWER EXTREMITY ANGIOGRAPHY Right 05/19/2017   Procedure: LOWER EXTREMITY ANGIOGRAPHY;  Surgeon: Algernon Huxley, MD;  Location: [facility] CV LAB;  Service: Cardiovascular;  Laterality: Right;  . LOWER EXTREMITY ANGIOGRAPHY Left 08/10/2018   Procedure: LOWER EXTREMITY ANGIOGRAPHY;  Surgeon: Algernon Huxley, MD;  Location: [facility] CV LAB;  Service: Cardiovascular;  Laterality: Left;    FAMILY HISTORY :   Family History  Problem Relation Age of Onset  . Hypertension Sister     SOCIAL HISTORY:   Social History   Tobacco Use  . Smoking status: Current Every Day Smoker    Packs/day: 0.50    Years: 47.00    Pack years: 23.50    Types: Cigarettes  . Smokeless tobacco: Never Used  Vaping Use  . Vaping Use: Never used  Substance Use Topics  . Alcohol use: Yes    Alcohol/week: 1.0 standard drink    Types: 1 Glasses of wine  per week    Comment:  almost none in last 6 months  . Drug use: No    ALLERGIES:  has No Known Allergies.  MEDICATIONS:  Current Outpatient Medications  Medication Sig Dispense Refill  . atorvastatin (LIPITOR) 10 MG tablet Take 1 tablet (10 mg total) by mouth daily. 30 tablet 11  . clopidogrel (PLAVIX) 75 MG tablet Take 75 mg by mouth daily.     . Ensure (ENSURE) Take 237 mLs by mouth 3 (three) times daily between meals.     . folic acid (FOLVITE) 1 MG tablet Take 1 tablet (1 mg total) by mouth daily. 90 tablet 1  . [START ON 07/31/2020] gabapentin (NEURONTIN) 300 MG capsule Take 3 capsules (900 mg total) by mouth at bedtime AND 1 capsule (300 mg total) 2 (two) times daily. 150 capsule 2  . methocarbamol (ROBAXIN) 500 MG tablet Take 1 tablet (500 mg total) by mouth every 6 (six) hours as needed for muscle spasms. 80 tablet 0  . mirtazapine (REMERON) 30 MG tablet Take 1 tablet (30 mg total) by mouth at bedtime. 30 tablet 3  . naloxegol oxalate (MOVANTIK) 25 MG TABS tablet Take 1 tablet (25 mg total) by mouth daily. 30 tablet 0  . naloxone (NARCAN) 2 MG/2ML injection Inject 1 mL (1 mg total) into the muscle as needed for up to 2 doses (for opioid overdose). In case of emergency (overdose), inject into muscle of upper arm or leg and call 911. 2 mL 0  .  Oxycodone HCl 10 MG TABS Take 1 tablet (10 mg total) by mouth every 4 (four) hours as needed. Must last 30 days 180 tablet 0  . [START ON 08/10/2020] Oxycodone HCl 10 MG TABS Take 1 tablet (10 mg total) by mouth every 4 (four) hours as needed. Must last 30 days 180 tablet 0  . [START ON 09/09/2020] Oxycodone HCl 10 MG TABS Take 1 tablet (10 mg total) by mouth every 4 (four) hours as needed. Must last 30 days 180 tablet 0  . pantoprazole (PROTONIX) 40 MG tablet Take 40 mg by mouth at bedtime.    . metFORMIN (GLUCOPHAGE) 1000 MG tablet Take 1 tablet (1,000 mg total) by mouth daily with breakfast. 30 tablet 3   No current facility-administered  medications for this visit.    PHYSICAL EXAMINATION: ECOG PERFORMANCE STATUS: 1 - Symptomatic but completely ambulatory  BP 122/79   Pulse (!) 113   Temp 97.7 F (36.5 C) (Tympanic)   Resp 20   Ht 5' 8"  (1.727 m)   Wt 138 lb (62.6 kg)   BMI 20.98 kg/m   Filed Weights   07/18/20 1113  Weight: 138 lb (62.6 kg)    Physical Exam Constitutional:      Comments: He is alone.  Appears pale.  HENT:     Head: Normocephalic and atraumatic.     Mouth/Throat:     Pharynx: No oropharyngeal exudate.  Eyes:     Pupils: Pupils are equal, round, and reactive to light.  Cardiovascular:     Rate and Rhythm: Normal rate and regular rhythm.  Pulmonary:     Effort: No respiratory distress.     Breath sounds: No wheezing.  Abdominal:     General: Bowel sounds are normal. There is no distension.     Palpations: Abdomen is soft. There is no mass.     Tenderness: There is no abdominal tenderness. There is no guarding or rebound.  Musculoskeletal:        General: No tenderness. Normal range of motion.     Cervical back: Normal range of motion and neck supple.  Skin:    General: Skin is warm.     Coloration: Skin is pale.     Comments: Multiple bruises noted in bilateral upper extremity.  Neurological:     Mental Status: He is alert and oriented to person, place, and time.  Psychiatric:        Mood and Affect: Affect normal.       LABORATORY DATA:  I have reviewed the data as listed    Component Value Date/Time   NA 138 07/18/2020 1045   NA 142 05/03/2014 1139   K 3.9 07/18/2020 1045   K 4.7 05/03/2014 1139   CL 103 07/18/2020 1045   CL 110 (H) 05/03/2014 1139   CO2 24 07/18/2020 1045   CO2 24 05/03/2014 1139   GLUCOSE 109 (H) 07/18/2020 1045   GLUCOSE 98 05/03/2014 1139   BUN 19 07/18/2020 1045   BUN 20 (H) 05/03/2014 1139   CREATININE 1.25 (H) 07/18/2020 1045   CREATININE 1.22 08/09/2014 1122   CALCIUM 9.2 07/18/2020 1045   CALCIUM 8.6 05/03/2014 1139   PROT 7.4  07/18/2020 1045   PROT 7.5 05/03/2014 1139   ALBUMIN 4.8 07/18/2020 1045   ALBUMIN 4.1 05/03/2014 1139   AST 12 (L) 07/18/2020 1045   AST 15 05/03/2014 1139   ALT 9 07/18/2020 1045   ALT 26 05/03/2014 1139   ALKPHOS 77 07/18/2020  1045   ALKPHOS 94 05/03/2014 1139   BILITOT 1.0 07/18/2020 1045   BILITOT 0.5 05/03/2014 1139   GFRNONAA >60 07/18/2020 1045   GFRNONAA >60 08/09/2014 1122   GFRAA 55 (L) 01/24/2020 1156   GFRAA >60 08/09/2014 1122    No results found for: SPEP, UPEP  Lab Results  Component Value Date   WBC 7.8 07/18/2020   NEUTROABS 2.0 07/18/2020   HGB 6.6 (L) 07/18/2020   HCT 20.7 (L) 07/18/2020   MCV 111.9 (H) 07/18/2020   PLT 253 07/18/2020      Chemistry      Component Value Date/Time   NA 138 07/18/2020 1045   NA 142 05/03/2014 1139   K 3.9 07/18/2020 1045   K 4.7 05/03/2014 1139   CL 103 07/18/2020 1045   CL 110 (H) 05/03/2014 1139   CO2 24 07/18/2020 1045   CO2 24 05/03/2014 1139   BUN 19 07/18/2020 1045   BUN 20 (H) 05/03/2014 1139   CREATININE 1.25 (H) 07/18/2020 1045   CREATININE 1.22 08/09/2014 1122      Component Value Date/Time   CALCIUM 9.2 07/18/2020 1045   CALCIUM 8.6 05/03/2014 1139   ALKPHOS 77 07/18/2020 1045   ALKPHOS 94 05/03/2014 1139   AST 12 (L) 07/18/2020 1045   AST 15 05/03/2014 1139   ALT 9 07/18/2020 1045   ALT 26 05/03/2014 1139   BILITOT 1.0 07/18/2020 1045   BILITOT 0.5 05/03/2014 1139       RADIOGRAPHIC STUDIES: I have personally reviewed the radiological images as listed and agreed with the findings in the report. No results found.   ASSESSMENT & PLAN:  CLL (chronic lymphocytic leukemia) ([place]) # CLL/SLL- relapsed most recently s/p Gazyva x6 cycles [AUG 2021] 15th MARCH 2022- No evidence of active lymphoma by FDG PET scan; Stable mild mediastinal adenopathy and moderate periaortic retroperitoneal adenopathy. No significant metabolic activity in these nodes.  # worsening severe anemia-hemoglobin  6.6-etiology is unclear.  Previous bone marrow biopsy 2019 October-no etiology noted.  Given the worsening anemia will recommend repeating bone marrow biopsy.  Will discuss further.  Proceed with PRBC transfusion today.  #Chronic abdominal pain-unclear etiology- STABLE.currently being titrated ; on pain pump as per pain management.   # weight loss/depression; declines nutrition; plan remeron 30 mg qhs.   #Hyperkalemia-5.1; STABLE today- /CKD-III  [baseline 1.4; Dr.Lateef]; STABLE; continue LoKelma as per Nephrology.   # DISPOSITION: # Aranesp today # 3/25- 1 unit PRBC transfusion # follow up TBD- Dr.B  No orders of the defined types were placed in this encounter.  All questions were answered. The patient knows to call the clinic with any problems, questions or concerns.      Cammie Sickle, MD 07/27/2020 2:08 PM

## 2020-07-21 ENCOUNTER — Other Ambulatory Visit: Payer: Self-pay

## 2020-07-21 ENCOUNTER — Inpatient Hospital Stay: Payer: Medicare Other

## 2020-07-21 DIAGNOSIS — N183 Anemia in chronic kidney disease: Secondary | ICD-10-CM

## 2020-07-21 DIAGNOSIS — C9110 Chronic lymphocytic leukemia of B-cell type not having achieved remission: Secondary | ICD-10-CM

## 2020-07-21 DIAGNOSIS — D649 Anemia, unspecified: Secondary | ICD-10-CM

## 2020-07-21 DIAGNOSIS — N189 Chronic kidney disease, unspecified: Secondary | ICD-10-CM | POA: Diagnosis not present

## 2020-07-21 DIAGNOSIS — D631 Anemia in chronic kidney disease: Secondary | ICD-10-CM

## 2020-07-21 MED ORDER — ACETAMINOPHEN 325 MG PO TABS
650.0000 mg | ORAL_TABLET | Freq: Once | ORAL | Status: AC
  Administered 2020-07-21: 650 mg via ORAL
  Filled 2020-07-21: qty 2

## 2020-07-21 MED ORDER — SODIUM CHLORIDE 0.9% IV SOLUTION
250.0000 mL | Freq: Once | INTRAVENOUS | Status: AC
  Administered 2020-07-21: 250 mL via INTRAVENOUS
  Filled 2020-07-21: qty 250

## 2020-07-21 MED ORDER — DIPHENHYDRAMINE HCL 25 MG PO CAPS
25.0000 mg | ORAL_CAPSULE | Freq: Once | ORAL | Status: AC
  Administered 2020-07-21: 25 mg via ORAL
  Filled 2020-07-21: qty 1

## 2020-07-22 LAB — TYPE AND SCREEN
ABO/RH(D): A POS
Antibody Screen: NEGATIVE
Unit division: 0

## 2020-07-22 LAB — BPAM RBC
Blood Product Expiration Date: 202204102359
ISSUE DATE / TIME: 202203250924
Unit Type and Rh: 6200

## 2020-07-26 NOTE — Progress Notes (Signed)
PROVIDER NOTE: Information contained herein reflects review and annotations entered in association with encounter. Interpretation of such information and data should be left to medically-trained personnel. Information provided to patient can be located elsewhere in the medical record under "Patient Instructions". Document created using STT-dictation technology, any transcriptional errors that may result from process are unintentional.    Patient: James [name]  Service Category: Procedure  Provider: Gaspar Cola, MD  DOB: [birth year]  DOS: 07/27/2020  Location: [facility]  MRN: [id number]  Setting: Ambulatory - outpatient  Referring Provider: [name], MD  Type: Established Patient  Specialty: Interventional Pain Management  PCP: [name], MD   Primary Reason for Visit: Interventional Pain Management Treatment. CC: Abdominal Pain (Mid upper quadrant )  Procedure:          Intrathecal Drug Delivery System (IDDS):  Type: Reservoir Refill [phone])       Region: Abdominal Laterality: Left  Type of Pump: Medtronic Synchromed II Delivery Route: Intrathecal Type of Pain Treated: Neuropathic/Nociceptive Primary Medication Class: Opioid/opiate  Medication, Concentration, Infusion Program, & Delivery Rate: Please see scanned programming printout.   Indications: 1. Chronic pain syndrome   2. Chronic epigastric pain (1ry area of Pain)   3. Chronic abdominal pain   4. Cancer-related pain   5. CLL (chronic lymphocytic leukemia) ([place])   6. Malignant lymphoma, small lymphocytic (HCC)   7. Abdominal wall pain in epigastric region   8. Non-traumatic compression fracture of T2 thoracic vertebra, sequela   9. Non-traumatic compression fracture of T1 thoracic vertebra, sequela   10. Presence of implanted infusion pump (Medtronic)   11. Presence of intrathecal pump (Medtronic)   12. Pharmacologic therapy   13. Encounter for adjustment and management of infusion  pump    Pain Assessment: Self-Reported Pain Score: 4 /10             Reported level is compatible with observation.         RTCB: 10/09/2020  Pharmacotherapy Assessment  Analgesic: Oxycodone IR 1m, 1 tab PO q 4 hrs (60 mg/day of oxycodone IR) (90 MME) Highest recorded MME/day: 150 mg/day 90 day average MME/day: 632.27 mg/day, according to PMP   Monitoring: [place] PMP: PDMP reviewed during this encounter.       Pharmacotherapy: No side-effects or adverse reactions reported. Compliance: No problems identified. Effectiveness: Clinically acceptable. Plan: Refer to "POC".  UDS: No results found for: SUMMARY  Intrathecal Pump Therapy Assessment  Manufacturer: Medtronic Synchromed Type: Programmable Volume: 40 mL reservoir MRI compatibility: Yes   Drug content:  Primary Medication Class:Opioid Primary Medication:PF-Fentanyl(2000 mcg/mL) Secondary Medication:PF-Bupivacaine(20 mg/mL) Other Medication:None  PTM(PCA) mode: PTM dose:25 mcg bolus Duration of bolus:1 minute Lockout interval:4hours Maximum 24-hour doses:6   Programming:  Type: Simple continuous with PCA. See pump readout for details.   Changes:  Medication Change: None at this point Rate Change: No change in rate  Reported side-effects or adverse reactions: None reported  Effectiveness: Described as relatively effective, allowing for increase in activities of daily living (ADL) Clinically meaningful improvement in function (CMIF): Sustained CMIF goals met  Plan: Pump refill today  Pre-op H&P Assessment:  [name] a 70y.o. (year old), male patient, seen today for interventional treatment. He  has a past surgical history that includes Cholecystectomy (1983); Esophagogastroduodenoscopy (egd) with propofol (N/A, 11/20/2016); Cataract extraction w/ intraocular lens implant (Bilateral); Lower Extremity Angiography (Right, 05/19/2017); Esophagogastroduodenoscopy (egd) with propofol (N/A, 10/27/2017);  Colonoscopy with propofol (N/A, 10/27/2017); Lower Extremity Angiography (Left, 08/10/2018);  and Intrathecal pump implant (Left, 02/07/2020). James [name] has a current medication list which includes the following prescription(s): atorvastatin, clopidogrel, ensure, folic acid, [START ON 01/02/71] gabapentin, metformin, methocarbamol, mirtazapine, naloxegol oxalate, naloxone, oxycodone hcl, [START ON 08/11/70] oxycodone hcl, [START ON 09/10/70] oxycodone hcl, and pantoprazole. His primarily concern today is the Abdominal Pain (Mid upper quadrant )  Initial Vital Signs:  Pulse/HCG Rate: (!) 104  Temp: 97.6 F (36.4 C) Resp: 16 BP: (!) 148/80 SpO2: 100 %  BMI: Estimated body mass index is 20.98 kg/m as calculated from the following:   Height as of this encounter: 5' 8"  (1.727 m).   Weight as of this encounter: 138 lb (62.6 kg).  Risk Assessment: Allergies: Reviewed. He has No Known Allergies.  Allergy Precautions: None required Coagulopathies: Reviewed. None identified.  Blood-thinner therapy: None at this time Active Infection(s): Reviewed. None identified. James [name] is afebrile  Site Confirmation: James [name] was asked to confirm the procedure and laterality before marking the site Procedure checklist: Completed Consent: Before the procedure and under the influence of no sedative(s), amnesic(s), or anxiolytics, the patient was informed of the treatment options, risks and possible complications. To fulfill our ethical and legal obligations, as recommended by the American Medical Association's Code of Ethics, I have informed the patient of my clinical impression; the nature and purpose of the treatment or procedure; the risks, benefits, and possible complications of the intervention; the alternatives, including doing nothing; the risk(s) and benefit(s) of the alternative treatment(s) or procedure(s); and the risk(s) and benefit(s) of doing nothing.  James [name] was provided with information about the  general risks and possible complications associated with most interventional procedures. These include, but are not limited to: failure to achieve desired goals, infection, bleeding, organ or nerve damage, allergic reactions, paralysis, and/or death.  In addition, he was informed of those risks and possible complications associated to this particular procedure, which include, but are not limited to: damage to the implant; failure to decrease pain; local, systemic, or serious CNS infections, intraspinal abscess with possible cord compression and paralysis, or life-threatening such as meningitis; bleeding; organ damage; nerve injury or damage with subsequent sensory, motor, and/or autonomic system dysfunction, resulting in transient or permanent pain, numbness, and/or weakness of one or several areas of the body; allergic reactions, either minor or major life-threatening, such as anaphylactic or anaphylactoid reactions.  Furthermore, Mr. Arizmendi was informed of those risks and complications associated with the medications. These include, but are not limited to: allergic reactions (i.e.: anaphylactic or anaphylactoid reactions); endorphine suppression; bradycardia and/or hypotension; water retention and/or peripheral vascular relaxation leading to lower extremity edema and possible stasis ulcers; respiratory depression and/or shortness of breath; decreased metabolic rate leading to weight gain; swelling or edema; medication-induced neural toxicity; particulate matter embolism and blood vessel occlusion with resultant organ, and/or nervous system infarction; and/or intrathecal granuloma formation with possible spinal cord compression and permanent paralysis.  Before refilling the pump Mr. Archey was informed that some of the medications used in the devise may not be FDA approved for such use and therefore it constitutes an off-label use of the medications.  Finally, he was informed that Medicine is not an exact  science; therefore, there is also the possibility of unforeseen or unpredictable risks and/or possible complications that may result in a catastrophic outcome. The patient indicated having understood very clearly. We have given the patient no guarantees and we have made no promises. Enough time was given to the patient to ask questions, all  of which were answered to the patient's satisfaction. Mr. Vera has indicated that he wanted to continue with the procedure. Attestation: I, the ordering provider, attest that I have discussed with the patient the benefits, risks, side-effects, alternatives, likelihood of achieving goals, and potential problems during recovery for the procedure that I have provided informed consent. Date  Time: 07/27/2020 12:58 PM  Pre-Procedure Preparation:  Monitoring: As per clinic protocol. Respiration, ETCO2, SpO2, BP, heart rate and rhythm monitor placed and checked for adequate function Safety Precautions: Patient was assessed for positional comfort and pressure points before starting the procedure. Time-out: I initiated and conducted the "Time-out" before starting the procedure, as per protocol. The patient was asked to participate by confirming the accuracy of the "Time Out" information. Verification of the correct person, site, and procedure were performed and confirmed by me, the nursing staff, and the patient. "Time-out" conducted as per Joint Commission's Universal Protocol (UP.01.01.01). Time: 1314  Description of Procedure:          Position: Supine Target Area: Central-port of intrathecal pump. Approach: Anterior, 90 degree angle approach. Area Prepped: Entire Area around the pump implant. DuraPrep (Iodine Povacrylex [0.7% available iodine] and Isopropyl Alcohol, 74% w/w) Safety Precautions: Aspiration looking for blood return was conducted prior to all injections. At no point did we inject any substances, as a needle was being advanced. No attempts were made at  seeking any paresthesias. Safe injection practices and needle disposal techniques used. Medications properly checked for expiration dates. SDV (single dose vial) medications used. Description of the Procedure: Protocol guidelines were followed. Two nurses trained to do implant refills were present during the entire procedure. The refill medication was checked by both healthcare providers as well as the patient. The patient was included in the "Time-out" to verify the medication. The patient was placed in position. The pump was identified. The area was prepped in the usual manner. The sterile template was positioned over the pump, making sure the side-port location matched that of the pump. Both, the pump and the template were held for stability. The needle provided in the Medtronic Kit was then introduced thru the center of the template and into the central port. The pump content was aspirated and discarded volume documented. The new medication was slowly infused into the pump, thru the filter, making sure to avoid overpressure of the device. The needle was then removed and the area cleansed, making sure to leave some of the prepping solution back to take advantage of its long term bactericidal properties. The pump was interrogated and programmed to reflect the correct medication, volume, and dosage. The program was printed and taken to the physician for approval. Once checked and signed by the physician, a copy was provided to the patient and another scanned into the EMR. Vitals:   07/27/20 1258  BP: (!) 148/80  Pulse: (!) 104  Resp: 16  Temp: 97.6 F (36.4 C)  TempSrc: Temporal  SpO2: 100%  Weight: 138 lb (62.6 kg)  Height: 5' 8"  (1.727 m)    Start Time: 1314 hrs. End Time: 1327 hrs. Materials & Medications: Medtronic Refill Kit Medication(s): Please see chart orders for details.  Imaging Guidance:          Type of Imaging Technique: None used Indication(s): N/A Exposure Time: No patient  exposure Contrast: None used. Fluoroscopic Guidance: N/A Ultrasound Guidance: N/A Interpretation: N/A  Antibiotic Prophylaxis:   Anti-infectives (From admission, onward)   None     Indication(s): None identified  Post-operative  Assessment:  Post-procedure Vital Signs:  Pulse/HCG Rate: (!) 104  Temp: 97.6 F (36.4 C) Resp: 16 BP: (!) 148/80 SpO2: 100 %  EBL: None  Complications: No immediate post-treatment complications observed by team, or reported by patient.  Note: The patient tolerated the entire procedure well. A repeat set of vitals were taken after the procedure and the patient was kept under observation following institutional policy, for this type of procedure. Post-procedural neurological assessment was performed, showing return to baseline, prior to discharge. The patient was provided with post-procedure discharge instructions, including a section on how to identify potential problems. Should any problems arise concerning this procedure, the patient was given instructions to immediately contact us, at any time, without hesitation. In any case, we plan to contact the patient by telephone for a follow-up status report regarding this interventional procedure.  Comments:  No additional relevant information.  Plan of Care  Orders:  Orders Placed This Encounter  Procedures  . PUMP REFILL    Maintain Protocol by having two(2) healthcare providers during procedure and programming.    Scheduling Instructions:     Please refill intrathecal pump today.    Order Specific Question:   Where will this procedure be performed?    Answer:   [facility]  . PUMP REFILL    Whenever possible schedule on a procedure today.    Standing Status:   Future    Standing Expiration Date:   12/23/2020    Scheduling Instructions:     Please schedule intrathecal pump refill based on pump programming. Avoid schedule intervals of more than 120 days (4 months).    Order Specific Question:    Where will this procedure be performed?    Answer:   [facility]  . Informed Consent Details: Physician/Practitioner Attestation; Transcribe to consent form and obtain patient signature    Transcribe to consent form and obtain patient signature.    Order Specific Question:   Physician/Practitioner attestation of informed consent for procedure/surgical case    Answer:   I, the physician/practitioner, attest that I have discussed with the patient the benefits, risks, side effects, alternatives, likelihood of achieving goals and potential problems during recovery for the procedure that I have provided informed consent.    Order Specific Question:   Procedure    Answer:   Intrathecal pump refill    Order Specific Question:   Physician/Practitioner performing the procedure    Answer:   Attending Physician: Kathlen Brunswick. Dossie Arbour, MD & designated trained staff    Order Specific Question:   Indication/Reason    Answer:   Chronic Pain Syndrome (G89.4), presence of an intrathecal pump (Z97.8)   Chronic Opioid Analgesic:  Oxycodone IR 1m, 1 tab PO q 4 hrs (60 mg/day of oxycodone IR) (90 MME) Highest recorded MME/day: 150 mg/day 90 day average MME/day: 632.27 mg/day, according to PMP   Medications ordered for procedure: No orders of the defined types were placed in this encounter.  Medications administered: Tyrome F. Casteneda had no medications administered during this visit.  See the medical record for exact dosing, route, and time of administration.  Follow-up plan:   Return for Pump Refill (Max:323mo       Interventional Therapies  Risk  Complexity Considerations:   NOTE: PLAVIX ANTICOAGULATION  (Stop: 7 days  Restart: 2 hrs)   Planned  Pending:   Therapeutic bilateral splanchnic nerves neurolysis #1 with alcohol  Diagnostic intrathecal catheter test under fluoroscopic guidance to determine location  Under consideration:   Possible revision of intrathecal catheter     Completed:   Diagnostic bilateral celiac plexus block x2 (06/01/2019; 11/02/2019) (50/30/30/0) (75/75/0/0)  Therapeutic bilateral celiac plexus neurolysis with phenol x1 (04/06/2020)  Diagnostic bilateral rectus abdominis trigger point injection x1 (04/06/2020) (0/0/0)  Diagnostic (ML) T12-L1 intrathecal injection of PF-fentanyl 25 mcg as an intrathecal pump trial x1 (12/14/2019)   Therapeutic  Palliative (PRN) options:   Intrathecal pump management     Recent Visits Date Type Provider Dept  06/27/20 Procedure visit Milinda Pointer, MD [facility]  06/01/20 Procedure visit Milinda Pointer, MD [facility]  05/04/20 Procedure visit Milinda Pointer, MD [facility]  05/01/20 Telemedicine Milinda Pointer, MD [facility]  Showing recent visits within past 90 days and meeting all other requirements Today's Visits Date Type Provider Dept  07/27/20 Procedure visit Milinda Pointer, MD [facility]  Showing today's visits and meeting all other requirements Future Appointments Date Type Provider Dept  08/29/20 Appointment Milinda Pointer, MD [facility]  Showing future appointments within next 90 days and meeting all other requirements  Disposition: Discharge home  Discharge (Date  Time): 07/27/2020; 1348 hrs.   Primary Care Physician: [name], MD Location: [facility] Note by: Gaspar Cola, MD Date: 07/27/2020; Time: 2:51 PM  Disclaimer:  Medicine is not an Chief Strategy Officer. The only guarantee in medicine is that nothing is guaranteed. It is important to note that the decision to proceed with this intervention was based on the information collected from the patient. The Data and conclusions were drawn from the patient's questionnaire, the interview, and the physical examination. Because the information was provided in large part by the patient, it cannot be guaranteed that it  has not been purposely or unconsciously manipulated. Every effort has been made to obtain as much relevant data as possible for this evaluation. It is important to note that the conclusions that lead to this procedure are derived in large part from the available data. Always take into account that the treatment will also be dependent on availability of resources and existing treatment guidelines, considered by other Pain Management Practitioners as being common knowledge and practice, at the time of the intervention. For Medico-Legal purposes, it is also important to point out that variation in procedural techniques and pharmacological choices are the acceptable norm. The indications, contraindications, technique, and results of the above procedure should only be interpreted and judged by a Board-Certified Interventional Pain Specialist with extensive familiarity and expertise in the same exact procedure and technique.

## 2020-07-27 ENCOUNTER — Other Ambulatory Visit: Payer: Self-pay

## 2020-07-27 ENCOUNTER — Encounter: Payer: Self-pay | Admitting: Pain Medicine

## 2020-07-27 ENCOUNTER — Ambulatory Visit: Payer: Medicare Other | Attending: Pain Medicine | Admitting: Pain Medicine

## 2020-07-27 ENCOUNTER — Telehealth: Payer: Self-pay | Admitting: Internal Medicine

## 2020-07-27 VITALS — BP 148/80 | HR 104 | Temp 97.6°F | Resp 16 | Ht 68.0 in | Wt 138.0 lb

## 2020-07-27 DIAGNOSIS — G8929 Other chronic pain: Secondary | ICD-10-CM

## 2020-07-27 DIAGNOSIS — C8300 Small cell B-cell lymphoma, unspecified site: Secondary | ICD-10-CM

## 2020-07-27 DIAGNOSIS — G893 Neoplasm related pain (acute) (chronic): Secondary | ICD-10-CM | POA: Insufficient documentation

## 2020-07-27 DIAGNOSIS — M4854XS Collapsed vertebra, not elsewhere classified, thoracic region, sequela of fracture: Secondary | ICD-10-CM | POA: Insufficient documentation

## 2020-07-27 DIAGNOSIS — G894 Chronic pain syndrome: Secondary | ICD-10-CM

## 2020-07-27 DIAGNOSIS — Z451 Encounter for adjustment and management of infusion pump: Secondary | ICD-10-CM | POA: Diagnosis not present

## 2020-07-27 DIAGNOSIS — C9110 Chronic lymphocytic leukemia of B-cell type not having achieved remission: Secondary | ICD-10-CM | POA: Diagnosis not present

## 2020-07-27 DIAGNOSIS — Z95828 Presence of other vascular implants and grafts: Secondary | ICD-10-CM | POA: Insufficient documentation

## 2020-07-27 DIAGNOSIS — Z79899 Other long term (current) drug therapy: Secondary | ICD-10-CM | POA: Diagnosis not present

## 2020-07-27 DIAGNOSIS — R1013 Epigastric pain: Secondary | ICD-10-CM | POA: Diagnosis not present

## 2020-07-27 DIAGNOSIS — Z978 Presence of other specified devices: Secondary | ICD-10-CM

## 2020-07-27 DIAGNOSIS — R109 Unspecified abdominal pain: Secondary | ICD-10-CM | POA: Insufficient documentation

## 2020-07-27 NOTE — Telephone Encounter (Signed)
On 3/31- I tried to reach pt re: worsening anemia; need for repeat bone marrow biopsy. Unable to leave voice mail- as mail box is full.   H/T- please reach out to pt; I will be happy to speak to pt re: bone marrow; and follow up appts.  GB

## 2020-07-27 NOTE — Patient Instructions (Signed)
Opioid Overdose Opioids are drugs that are often used to treat pain. Opioids include illegal drugs, such as heroin, as well as prescription pain medicines, such as codeine, morphine, hydrocodone, oxycodone, and fentanyl. An opioid overdose happens when you take too much of an opioid. An overdose may be intentional or accidental and can happen with any type of opioid. The effects of an overdose can be mild, dangerous, or even deadly. Opioid overdose is a medical emergency. What are the causes? This condition may be caused by:  Taking too much of an opioid on purpose.  Taking too much of an opioid by accident.  Using two or more substances that contain opioids at the same time.  Taking an opioid with a substance that affects your heart, breathing, or blood pressure. These include alcohol, tranquilizers, sleeping pills, illegal drugs, and some over-the-counter medicines. This condition may also happen due to an error made by:  A health care provider who prescribes a medicine.  The pharmacist who fills the prescription order. What increases the risk? This condition is more likely in:  Children. They may be attracted to colorful pills. Because of a child's small size, even a small amount of a drug can be dangerous.  Older people. They may be taking many different drugs. Older people may have difficulty reading labels or remembering when they last took their medicine. They may also be more sensitive to the effects of opioids.  People with chronic medical conditions, especially heart, liver, kidney, or neurological diseases.  People who take an opioid for a long period of time.  People who use: ? Illegal drugs. IV heroin is especially dangerous. ? Other substances, including alcohol, while using an opioid.  People who have: ? A history of drug or alcohol abuse. ? Certain mental health conditions. ? A history of previous drug overdoses.  People who take opioids that are not prescribed  for them. What are the signs or symptoms? Symptoms of this condition depend on the type of opioid and the amount that was taken. Common symptoms include:  Sleepiness or difficulty waking from sleep.  Decrease in attention.  Confusion.  Slurred speech.  Slowed breathing and a slow pulse (bradycardia).  Nausea and vomiting.  Abnormally small pupils. Signs and symptoms that require emergency treatment include:  Cold, clammy, and pale skin.  Blue lips and fingernails.  Vomiting.  Gurgling sounds in the throat.  A pulse that is very slow or difficult to detect.  Breathing that is very irregular, slow, noisy, or difficult to detect.  Limp body.  Inability to respond to speech or be awakened from sleep (stupor).  Seizures. How is this diagnosed? This condition is diagnosed based on your symptoms and medical history. It is important to tell your health care provider:  About all of the opioids that you took.  When you took the opioids.  Whether you were drinking alcohol or using marijuana, cocaine, or other drugs. Your health care provider will do a physical exam. This exam may include:  Checking and monitoring your heart rate and rhythm, breathing rate, temperature, and blood pressure (vital signs).  Measuring oxygen levels in your blood.  Checking for abnormally small pupils. You may also have blood tests or urine tests. You may have X-rays if you are having severe breathing problems. How is this treated? This condition requires immediate medical treatment and hospitalization. Treatment is given in the hospital intensive care (ICU) setting. Supporting your vital signs and your breathing is the first step in   treating an opioid overdose. Treatment may also include:  Giving salts and minerals (electrolytes) along with fluids through an IV.  Inserting a breathing tube (endotracheal tube) in your airway to help you breathe if you cannot breathe on your own or you are in  danger of not being able to breathe on your own.  Giving oxygen through a small tube under your nose.  Passing a tube through your nose and into your stomach (nasogastric tube, or NG tube) to empty your stomach.  Giving medicines that: ? Increase your blood pressure. ? Relieve nausea and vomiting. ? Relieve abdominal pain and cramping. ? Reverse the effects of the opioid (naloxone).  Monitoring your heart and oxygen levels.  Ongoing counseling and mental health support if you intentionally overdosed or used an illegal drug. Follow these instructions at home: Medicines  Take over-the-counter and prescription medicines only as told by your health care provider.  Always ask your health care provider about possible side effects and interactions of any new medicine that you start taking.  Keep a list of all the medicines that you take, including over-the-counter medicines. Bring this list with you to all your medical visits. General instructions  Drink enough fluid to keep your urine pale yellow.  Keep all follow-up visits as told by your health care provider. This is important.   How is this prevented?  Read the drug inserts that come with your opioid pain medicines.  Take medicines only as told by your health care provider. Do not take more medicine than you are told. Do not take medicines more frequently than you are told.  Do not drink alcohol or take sedatives when taking opioids.  Do not use illegal or recreational drugs, including cocaine, ecstasy, and marijuana.  Do not take opioid medicines that are not prescribed for you.  Store all medicines in safety containers that are out of the reach of children.  Get help if you are struggling with: ? Alcohol or drug use. ? Depression or another mental health problem. ? Thoughts of hurting yourself or another person.  Keep the phone number of your local poison control center near your phone or in your mobile phone. In the  [place] the hotline of the National Poison Control Center is (800) 222-1222.  If you were prescribed naloxone, make sure you understand how to take it. Contact a health care provider if you:  Need help understanding how to take your pain medicines.  Feel your medicines are too strong.  Are concerned that your pain medicines are not working well for your pain.  Develop new symptoms or side effects when you are taking medicines. Get help right away if:  You or someone else is having symptoms of an opioid overdose. Get help even if you are not sure.  You have serious thoughts about hurting yourself or others.  You have: ? Chest pain. ? Difficulty breathing. ? A loss of consciousness. These symptoms may represent a serious problem that is an emergency. Do not wait to see if the symptoms will go away. Get medical help right away. Call your local emergency services (911 in the [place] Do not drive yourself to the hospital. If you ever feel like you may hurt yourself or others, or have thoughts about taking your own life, get help right away. You can go to your nearest emergency department or call:  Your local emergency services (911 in the [place]  A suicide crisis helpline, such as the National Suicide Prevention Lifeline   at 1-800-273-8255. This is open 24 hours a day. Summary  Opioids are drugs that are often used to treat pain. Opioids include illegal drugs, such as heroin, as well as prescription pain medicines.  An opioid overdose happens when you take too much of an opioid.  Overdoses can be intentional or accidental.  Opioid overdose is very dangerous. It is a life-threatening emergency.  If you or someone you know is experiencing an opioid overdose, get help right away. This information is not intended to replace advice given to you by your health care provider. Make sure you discuss any questions you have with your health care provider. Document Revised: 04/02/2018 Document  Reviewed: 04/02/2018 [employer] Patient Education  2021 [employer]  

## 2020-07-28 ENCOUNTER — Encounter: Payer: Self-pay | Admitting: *Deleted

## 2020-07-28 ENCOUNTER — Telehealth: Payer: Self-pay

## 2020-07-28 ENCOUNTER — Inpatient Hospital Stay: Payer: Medicare Other | Attending: Internal Medicine | Admitting: Hospice and Palliative Medicine

## 2020-07-28 DIAGNOSIS — C9110 Chronic lymphocytic leukemia of B-cell type not having achieved remission: Secondary | ICD-10-CM | POA: Diagnosis not present

## 2020-07-28 DIAGNOSIS — Z515 Encounter for palliative care: Secondary | ICD-10-CM | POA: Diagnosis not present

## 2020-07-28 DIAGNOSIS — N183 Chronic kidney disease, stage 3 unspecified: Secondary | ICD-10-CM

## 2020-07-28 DIAGNOSIS — D631 Anemia in chronic kidney disease: Secondary | ICD-10-CM | POA: Insufficient documentation

## 2020-07-28 DIAGNOSIS — N189 Chronic kidney disease, unspecified: Secondary | ICD-10-CM | POA: Insufficient documentation

## 2020-07-28 NOTE — Telephone Encounter (Signed)
Post IT pump refill phone call.  Patients son answered and states that he was doing fine.

## 2020-07-28 NOTE — Progress Notes (Signed)
Virtual Visit via Telephone Note  I connected with James [name] on 07/28/20 at 10:45 AM EDT by telephone and verified that I am speaking with the correct person using two identifiers.  Location: Patient: Home Provider: Clinic   I discussed the limitations, risks, security and privacy concerns of performing an evaluation and management service by telephone and the availability of in person appointments. I also discussed with the patient that there may be a patient responsible charge related to this service. The patient expressed understanding and agreed to proceed.   History of Present Illness: James [name] a 70 y.o.malewith multiple medical problems including stage IV CLL (initially diagnosed in 2006) most recently treated with ibrutinib but was discontinued due to poor tolerance. He is now s/p Gazyva. Patient has had severe and chronic abdominal pain of unclear etiology. He has had extensive work-up including referral to [facility]. He has been followed by interventionalpainmanagement and is status post celiac plexus blocks and insertion of intrathecal pain pump without significant relief in symptoms. He was referred to palliative care to help address goals and manage ongoing symptoms.   Observations/Objective: I called and spoke with patient by phone.    Patient endorses progressive fatigue.  This is likely attributed to his worsening anemia.  Dr. Rogue Bussing tried to reach patient yesterday to discuss the need for a repeat bone marrow biopsy.  I mention this to patient today and he verbalized a desire to proceed bone marrow biopsy if needed.  He said that his goal is anything to get him feeling better.  Will discuss with Dr. Rogue Bussing.  Patient continues to endorse chronic abdominal pain.  He now has a pain pump but says that he cannot tell much of a difference.  He is being followed actively by Dr. Dossie Arbour.  Patient denies other significant changes or concerns today.  No issues  with medications no need for refills.  Assessment and Plan: Stage IV CLL -on surveillance.  Followed by Dr. Rogue Bussing  Worsening anemia -patient in agreement with repeat bone marrow biopsy.  Discussed with Dr. Rogue Bussing  Chronic pain -managed by Dr. Dossie Arbour  Follow Up Instructions: Follow-up virtual visit 2-3 months   I discussed the assessment and treatment plan with the patient. The patient was provided an opportunity to ask questions and all were answered. The patient agreed with the plan and demonstrated an understanding of the instructions.   The patient was advised to call back or seek an in-person evaluation if the symptoms worsen or if the condition fails to improve as anticipated.  I provided 5 minutes of non-face-to-face time during this encounter.   Irean Hong, NP

## 2020-07-28 NOTE — Telephone Encounter (Signed)
Mychart msg also sent to patient's son

## 2020-07-31 ENCOUNTER — Telehealth: Payer: Self-pay | Admitting: *Deleted

## 2020-07-31 NOTE — Telephone Encounter (Signed)
James [name] Key: [id number] Pa submitted for Movantik 25 mg tabs.

## 2020-08-01 ENCOUNTER — Other Ambulatory Visit: Payer: Self-pay

## 2020-08-01 ENCOUNTER — Telehealth: Payer: Self-pay

## 2020-08-01 MED ORDER — PAIN MANAGEMENT IT PUMP REFILL: 1.0000 | Freq: Once | INTRATHECAL | 0 refills | Status: AC

## 2020-08-01 NOTE — Telephone Encounter (Signed)
Nutrition Follow-up:  Patient with stage IV CLL. Patient on gazyva.  Patient has pain pump for abdominal pain. Noted wife past in  Feb 2022.    Spoke with patient via phone for nutrition follow-up.   Patient reports that he has had and ensure and banana today.  Sometimes eats candy and ice cream sandwiches.  Tries to drink 2-3 bottles of ensure a day.    Medications: movantik, remeron  Labs: reviewed  Anthropometrics:   Weight 138 lb on 3/31  145 lb on 11/15 162 lb on 10/6   NUTRITION DIAGNOSIS: Inadequate oral intake continues with declining weight   INTERVENTION:  Offered complimentary case of ensure but declined at this time.   Encouraged oral nutrition supplements 3-4 times per day Encouraged patient eating within 1 hour of waking then q 2 hours after that.  We discussed options of high calorie, high protein foods Contact information provided    MONITORING, EVALUATION, GOAL: weight trends, intake   NEXT VISIT: Tuesday, May 3rd phone call  Bentli Llorente B. Zenia Resides, [place], [place] Registered Dietitian [phone] (mobile)

## 2020-08-02 NOTE — Telephone Encounter (Signed)
James [name] (Key: [id number]) [place] 25MG  tablets Status: PA Response - Approved Created: April 2nd, 2022 Sent: April 4th, 2022

## 2020-08-09 ENCOUNTER — Telehealth: Payer: Self-pay | Admitting: Internal Medicine

## 2020-08-09 DIAGNOSIS — D649 Anemia, unspecified: Secondary | ICD-10-CM

## 2020-08-09 DIAGNOSIS — C9110 Chronic lymphocytic leukemia of B-cell type not having achieved remission: Secondary | ICD-10-CM

## 2020-08-09 NOTE — Telephone Encounter (Signed)
Msg sent to specialty scheduling to arrange bone marrow bx asap- faxed worksheet as well.

## 2020-08-09 NOTE — Telephone Encounter (Signed)
C- Please schedule Bone marrow biopsy ASAP- ordered  # Next week- labs- cbc/hold tube- Possible 1 unit of PRBC transfusion   # in 2 weeks-labs- cbc/hold tube- Possible 1 unit of PRBC transfusion  # in 3 weeks-MD;  labs- cbc/hold tube-possible 1 unit of PRBC vs. aranesp-Dr.B  GB

## 2020-08-10 ENCOUNTER — Other Ambulatory Visit: Payer: Self-pay | Admitting: *Deleted

## 2020-08-10 ENCOUNTER — Telehealth: Payer: Self-pay | Admitting: Internal Medicine

## 2020-08-10 DIAGNOSIS — D649 Anemia, unspecified: Secondary | ICD-10-CM

## 2020-08-10 DIAGNOSIS — C9110 Chronic lymphocytic leukemia of B-cell type not having achieved remission: Secondary | ICD-10-CM

## 2020-08-10 MED FILL — Medication: INTRATHECAL | Qty: 1 | Status: AC

## 2020-08-10 NOTE — Telephone Encounter (Signed)
I spoke with the patient and his son. The patient declined any sooner apt for a blood transfusion. He is aware that he can call cancer ctr triage back at any time, if he changes his mind on receiving blood transfusion sooner. (Phone # give to patient and his son for triage.)  He is aware that we planning a bone marrow bx asap. Son request that the bone marrow be coordinated next week if possible. Son only has 2 more weeks left of continuous fmla. We will be in touch with the patient and his son once the bone marrow bx is set up. Reviewed the bone marrow prep instructions with patient/his son. Advised that patient will not be able to eat/drink anything 6-8 hours prior to the procedure. He will need to bring a driver for this apt.

## 2020-08-10 NOTE — Telephone Encounter (Signed)
On 4/13-I spoke to patient's son [name] regarding patient's overall health condition.  Given the unexplained worsening anemia recommend repeat bone marrow biopsy.  As per the son patient has been more tired over the last few days.  When asked if he would consider his father coming in for repeat lab/possible blood transfusion-the son states he will speak to his father and let us know.   Heather-if patient/family is interested please order-CBC/hold tube/1 unit of PRBC.   [facility]is [facility]ierR[facility] team

## 2020-08-15 NOTE — Progress Notes (Signed)
Called James [name], patient at [phone] on 08/15/2020 at 2:20 pm, and spoke with him regarding scheduled procedure of bone marrow biopsy 08/17/2020. Discussed: Arrival time of 0730 patient verbalized arrival time of 0730. Medication details discussed and ok to take meds with sip of water but do not take diabetic medication morning of procedure.  Discussed NPO after midnight day of procedure, need of responsible driver to drive home, no valuables, comfortable clothing and patient verbalized again the arrival time of 0730, and verbalized understanding of instructions.  Questions answered. Called and left message with son, Asael Pann at [phone] and he returned call promptly and I reviewed the arrival time of 0730, NPO past midnight day of procedure except for meds and to not take diabetic medication morning of procedure as will be NPO.  James [name] verbalized understanding of NPO and arrival time of 0730 and states he will be with is father to drive him home and stay with him post procedure.

## 2020-08-16 ENCOUNTER — Inpatient Hospital Stay: Payer: Medicare Other

## 2020-08-16 ENCOUNTER — Inpatient Hospital Stay (HOSPITAL_BASED_OUTPATIENT_CLINIC_OR_DEPARTMENT_OTHER): Payer: Medicare Other | Admitting: Oncology

## 2020-08-16 ENCOUNTER — Other Ambulatory Visit: Payer: Self-pay | Admitting: *Deleted

## 2020-08-16 ENCOUNTER — Other Ambulatory Visit: Payer: Self-pay

## 2020-08-16 ENCOUNTER — Other Ambulatory Visit: Payer: Self-pay | Admitting: Radiology

## 2020-08-16 DIAGNOSIS — C9110 Chronic lymphocytic leukemia of B-cell type not having achieved remission: Secondary | ICD-10-CM

## 2020-08-16 DIAGNOSIS — D649 Anemia, unspecified: Secondary | ICD-10-CM

## 2020-08-16 DIAGNOSIS — S70211A Abrasion, right hip, initial encounter: Secondary | ICD-10-CM

## 2020-08-16 DIAGNOSIS — D631 Anemia in chronic kidney disease: Secondary | ICD-10-CM | POA: Diagnosis not present

## 2020-08-16 DIAGNOSIS — N189 Chronic kidney disease, unspecified: Secondary | ICD-10-CM | POA: Diagnosis present

## 2020-08-16 LAB — SAMPLE TO BLOOD BANK

## 2020-08-16 LAB — CBC WITH DIFFERENTIAL/PLATELET
Abs Immature Granulocytes: 0 10*3/uL (ref 0.00–0.07)
Band Neutrophils: 2 %
Basophils Absolute: 0.1 10*3/uL (ref 0.0–0.1)
Basophils Relative: 2 %
Eosinophils Absolute: 0.1 10*3/uL (ref 0.0–0.5)
Eosinophils Relative: 2 %
HCT: 19 % — ABNORMAL LOW (ref 39.0–52.0)
Hemoglobin: 6 g/dL — ABNORMAL LOW (ref 13.0–17.0)
Lymphocytes Relative: 56 %
Lymphs Abs: 3.8 10*3/uL (ref 0.7–4.0)
MCH: 34.7 pg — ABNORMAL HIGH (ref 26.0–34.0)
MCHC: 31.6 g/dL (ref 30.0–36.0)
MCV: 109.8 fL — ABNORMAL HIGH (ref 80.0–100.0)
Monocytes Absolute: 0.8 10*3/uL (ref 0.1–1.0)
Monocytes Relative: 12 %
Neutro Abs: 1.9 10*3/uL (ref 1.7–7.7)
Neutrophils Relative %: 26 %
Platelets: 219 10*3/uL (ref 150–400)
RBC: 1.73 MIL/uL — ABNORMAL LOW (ref 4.22–5.81)
RDW: 22.3 % — ABNORMAL HIGH (ref 11.5–15.5)
Smear Review: ADEQUATE
WBC: 6.7 10*3/uL (ref 4.0–10.5)
nRBC: 0 % (ref 0.0–0.2)

## 2020-08-16 LAB — BASIC METABOLIC PANEL
Anion gap: 13 (ref 5–15)
BUN: 21 mg/dL (ref 8–23)
CO2: 18 mmol/L — ABNORMAL LOW (ref 22–32)
Calcium: 9.2 mg/dL (ref 8.9–10.3)
Chloride: 109 mmol/L (ref 98–111)
Creatinine, Ser: 1.26 mg/dL — ABNORMAL HIGH (ref 0.61–1.24)
GFR, Estimated: >60 mL/min (ref >60)
Glucose, Bld: 101 mg/dL — ABNORMAL HIGH (ref 70–99)
Potassium: 4 mmol/L (ref 3.5–5.1)
Sodium: 140 mmol/L (ref 135–145)

## 2020-08-16 LAB — PREPARE RBC (CROSSMATCH)

## 2020-08-16 MED ORDER — DIPHENHYDRAMINE HCL 25 MG PO CAPS
25.0000 mg | ORAL_CAPSULE | Freq: Once | ORAL | Status: AC
Start: 2020-08-16 — End: 2020-08-16
  Administered 2020-08-16: 25 mg via ORAL
  Filled 2020-08-16: qty 1

## 2020-08-16 MED ORDER — ACETAMINOPHEN 325 MG PO TABS
650.0000 mg | ORAL_TABLET | Freq: Once | ORAL | Status: AC
  Administered 2020-08-16: 650 mg via ORAL
  Filled 2020-08-16: qty 2

## 2020-08-16 MED ORDER — SODIUM CHLORIDE 0.9% IV SOLUTION
250.0000 mL | Freq: Once | INTRAVENOUS | Status: AC
  Administered 2020-08-16: 250 mL via INTRAVENOUS
  Filled 2020-08-16: qty 250

## 2020-08-16 NOTE — Progress Notes (Signed)
Symptom Management Consult note [facility]  Telephone:([phone] [phone]  Patient Care Team: [name], MD as PCP - General (Internal Medicine) Cammie Sickle, MD as Consulting Physician (Hematology and Oncology) Borders, Kirt Boys, NP as Nurse Practitioner (Hospice and Palliative Medicine) Verlon Au, NP as Nurse Practitioner (Hematology and Oncology) Jacquelin Hawking, NP as Nurse Practitioner (Oncology) Lonell Face, NP as Nurse Practitioner (Neurosurgery) Milinda Pointer, MD as Consulting Physician (Pain Medicine)   Name of the patient: James [name]  [id number]  09/20/1950   Date of visit: 08/16/2020   Diagnosis- CLL  Chief complaint/ Reason for visit- Abrasion to right hip   Heme/Onc history: James [name] is a 70-year-old male with past medical history significant for hypertension, arthrosclerosis, AVM, GERD, diabetes, CKD stage III, thoracic radiculitis, chronic pain syndrome and stage IV CLL diagnosed in 2006 most recently being treated with ibrutinib under the care of Dr. Rogue Bussing which was recently discontinued due to poor tolerance.  He is now on [place] last given on 07/18/20.  He has been battling severe and chronic abdominal pain of unclear etiology.  He has had extensive work-up with a referral to [facility].  He is followed by interventional pain management and is status post celiac plexus block and insertion of intrathecal pain pump without significant relief of his symptoms.  Most recently he complains of significant fatigue thought to be secondary to worsening anemia.  He is scheduled to have a bone marrow biopsy tomorrow.  He receives aranesp for anemia secondary to CKD and is now requiring blood transfusions.   Interval history-today, patient presents for 1 unit packed red blood cells and mentions to clinic staff that he has a sore on his right hip that has been present for about 6 weeks after he fell at  home.  States he has been meaning to mention this to Dr. Rogue Bussing but keeps forgetting during his appointments.  Reports opened wound after his fall which he applied antibacterial cream and has kept clean and dry ever since.  It has now developed a scab.  Admitted to some pain initially which has mostly resolved unless he is lying directly on his right hip.  Denies any fevers or drainage from the wounds.  ECOG FS:1 - Symptomatic but completely ambulatory  Review of systems- Review of Systems  Constitutional: Positive for malaise/fatigue and weight loss. Negative for chills and fever.  HENT: Negative for congestion, ear pain and tinnitus.   Eyes: Negative.  Negative for blurred vision and double vision.  Respiratory: Negative.  Negative for cough, sputum production and shortness of breath.   Cardiovascular: Negative.  Negative for chest pain, palpitations and leg swelling.  Gastrointestinal: Negative.  Negative for abdominal pain, constipation, diarrhea, nausea and vomiting.  Genitourinary: Negative for dysuria, frequency and urgency.  Musculoskeletal: Positive for falls. Negative for back pain.  Skin: Negative.  Negative for rash.       Wound to right hip   Neurological: Positive for weakness. Negative for headaches.  Endo/Heme/Allergies: Negative.  Does not bruise/bleed easily.  Psychiatric/Behavioral: Negative.  Negative for depression. The patient is not nervous/anxious and does not have insomnia.      Current treatment-Gazyva last given on 07/18/2020  No Known Allergies   Past Medical History:  Diagnosis Date  . CLL (chronic lymphocytic leukemia) ([place])   . Constipation   . Depression   . Diabetes mellitus without complication ([place])   . GERD (gastroesophageal reflux disease)   .  Hematuria   . Hyperlipidemia   . Hypertension   . Therapeutic opioid induced constipation      Past Surgical History:  Procedure Laterality Date  . CATARACT EXTRACTION W/ INTRAOCULAR LENS IMPLANT  Bilateral   . CHOLECYSTECTOMY  1983  . COLONOSCOPY WITH PROPOFOL N/A 10/27/2017   Procedure: COLONOSCOPY WITH PROPOFOL;  Surgeon: Manya Silvas, MD;  Location[facility];  Service: Endoscopy;  Laterality: N/A;  . ESOPHAGOGASTRODUODENOSCOPY (EGD) WITH PROPOFOL N/A 11/20/2016   Procedure: ESOPHAGOGASTRODUODENOSCOPY (EGD) WITH PROPOFOL;  Surgeon: Manya Silvas, MD;  Location: [facility] ENDOSCOPY;  Service: Endoscopy;  Laterality: N/A;  . ESOPHAGOGASTRODUODENOSCOPY (EGD) WITH PROPOFOL N/A 10/27/2017   Procedure: ESOPHAGOGASTRODUODENOSCOPY (EGD) WITH PROPOFOL;  Surgeon: Manya Silvas, MD;  Location: [facility] ENDOSCOPY;  Service: Endoscopy;  Laterality: N/A;  . INTRATHECAL PUMP IMPLANT Left 02/07/2020   Procedure: INTRATHECAL PUMP & CATHETER IMPLANT;  Surgeon: Deetta Perla, MD;  Location: [facility] ORS;  Service: Neurosurgery;  Laterality: Left;  . LOWER EXTREMITY ANGIOGRAPHY Right 05/19/2017   Procedure: LOWER EXTREMITY ANGIOGRAPHY;  Surgeon: Algernon Huxley, MD;  Location: [place] CV LAB;  Service: Cardiovascular;  Laterality: Right;  . LOWER EXTREMITY ANGIOGRAPHY Left 08/10/2018   Procedure: LOWER EXTREMITY ANGIOGRAPHY;  Surgeon: Algernon Huxley, MD;  Location: [place] CV LAB;  Service: Cardiovascular;  Laterality: Left;    Social History   Socioeconomic History  . Marital status: Married    Spouse name: [name]  . Number of children: 1  . Years of education: Not on file  . Highest education level: Not on file  Occupational History  . Occupation: retired    Comment: truck Geophysicist/field seismologist  Tobacco Use  . Smoking status: Current Every Day Smoker    Packs/day: 0.50    Years: 47.00    Pack years: 23.50    Types: Cigarettes  . Smokeless tobacco: Never Used  Vaping Use  . Vaping Use: Never used  Substance and Sexual Activity  . Alcohol use: Yes    Alcohol/week: 1.0 standard drink    Types: 1 Glasses of wine per week    Comment:  almost none in last 6 months  . Drug use: No  . Sexual  activity: Never    Partners: Female  Other Topics Concern  . Not on file  Social History Narrative  . Not on file   Social Determinants of Health   Financial Resource Strain: Not on file  Food Insecurity: Not on file  Transportation Needs: Not on file  Physical Activity: Not on file  Stress: Not on file  Social Connections: Not on file  Intimate Partner Violence: Not on file    Family History  Problem Relation Age of Onset  . Hypertension Sister      Current Outpatient Medications:  .  atorvastatin (LIPITOR) 10 MG tablet, Take 1 tablet (10 mg total) by mouth daily., Disp: 30 tablet, Rfl: 11 .  clopidogrel (PLAVIX) 75 MG tablet, Take 75 mg by mouth daily. , Disp: , Rfl:  .  Ensure (ENSURE), Take 237 mLs by mouth 3 (three) times daily between meals. , Disp: , Rfl:  .  folic acid (FOLVITE) 1 MG tablet, Take 1 tablet (1 mg total) by mouth daily., Disp: 90 tablet, Rfl: 1 .  gabapentin (NEURONTIN) 300 MG capsule, Take 3 capsules (900 mg total) by mouth at bedtime AND 1 capsule (300 mg total) 2 (two) times daily., Disp: 150 capsule, Rfl: 2 .  metFORMIN (GLUCOPHAGE) 1000 MG tablet, Take 1 tablet (1,000 mg  total) by mouth daily with breakfast., Disp: 30 tablet, Rfl: 3 .  methocarbamol (ROBAXIN) 500 MG tablet, Take 1 tablet (500 mg total) by mouth every 6 (six) hours as needed for muscle spasms., Disp: 80 tablet, Rfl: 0 .  mirtazapine (REMERON) 30 MG tablet, Take 1 tablet (30 mg total) by mouth at bedtime., Disp: 30 tablet, Rfl: 3 .  naloxegol oxalate (MOVANTIK) 25 MG TABS tablet, Take 1 tablet (25 mg total) by mouth daily., Disp: 30 tablet, Rfl: 0 .  naloxone (NARCAN) 2 MG/2ML injection, Inject 1 mL (1 mg total) into the muscle as needed for up to 2 doses (for opioid overdose). In case of emergency (overdose), inject into muscle of upper arm or leg and call 911., Disp: 2 mL, Rfl: 0 .  Oxycodone HCl 10 MG TABS, Take 1 tablet (10 mg total) by mouth every 4 (four) hours as needed. Must last 30  days, Disp: 180 tablet, Rfl: 0 .  Oxycodone HCl 10 MG TABS, Take 1 tablet (10 mg total) by mouth every 4 (four) hours as needed. Must last 30 days, Disp: 180 tablet, Rfl: 0 .  [START ON 09/09/2020] Oxycodone HCl 10 MG TABS, Take 1 tablet (10 mg total) by mouth every 4 (four) hours as needed. Must last 30 days, Disp: 180 tablet, Rfl: 0 .  pantoprazole (PROTONIX) 40 MG tablet, Take 40 mg by mouth at bedtime., Disp: , Rfl:  No current facility-administered medications for this visit.  Facility-Administered Medications Ordered in Other Visits:  .  0.9 %  sodium chloride infusion, , Intravenous, Continuous, Monia Sabal, PA-C, Stopped at 08/17/20 1028 .  fentaNYL (SUBLIMAZE) 100 MCG/2ML injection, , , ,  .  heparin lock flush 100 UNIT/ML injection, , , ,  .  HYDROcodone-acetaminophen (NORCO/VICODIN) 5-325 MG per tablet 1-2 tablet, 1-2 tablet, Oral, Q4H PRN, Suttle, Rosanne Ashing, MD .  midazolam (VERSED) 2 MG/2ML injection, , , ,   Physical exam: There were no vitals filed for this visit. Physical Exam Constitutional:      Appearance: Normal appearance.  HENT:     Head: Normocephalic and atraumatic.  Eyes:     Pupils: Pupils are equal, round, and reactive to light.  Cardiovascular:     Rate and Rhythm: Normal rate and regular rhythm.     Heart sounds: Normal heart sounds. No murmur heard.   Pulmonary:     Effort: Pulmonary effort is normal.     Breath sounds: Normal breath sounds. No wheezing.  Abdominal:     General: Bowel sounds are normal. There is no distension.     Palpations: Abdomen is soft.     Tenderness: There is no abdominal tenderness.  Musculoskeletal:        General: Normal range of motion.     Cervical back: Normal range of motion.  Skin:    General: Skin is warm and dry.     Findings: Abrasion and rash present. Rash is crusting.  Neurological:     Mental Status: He is alert and oriented to person, place, and time.  Psychiatric:        Judgment: Judgment normal.       CMP Latest Ref Rng & Units 08/16/2020  Glucose 70 - 99 mg/dL 101(H)  BUN 8 - 23 mg/dL 21  Creatinine 0.61 - 1.24 mg/dL 1.26(H)  Sodium 135 - 145 mmol/L 140  Potassium 3.5 - 5.1 mmol/L 4.0  Chloride 98 - 111 mmol/L 109  CO2 22 - 32 mmol/L 18(L)  Calcium  8.9 - 10.3 mg/dL 9.2  Total Protein 6.5 - 8.1 g/dL -  Total Bilirubin 0.3 - 1.2 mg/dL -  Alkaline Phos 38 - 126 U/L -  AST 15 - 41 U/L -  ALT 0 - 44 U/L -   CBC Latest Ref Rng & Units 08/17/2020  WBC 4.0 - 10.5 K/uL 11.9(H)  Hemoglobin 13.0 - 17.0 g/dL 8.7(L)  Hematocrit 39.0 - 52.0 % 25.5(L)  Platelets 150 - 400 K/uL 204    No images are attached to the encounter.  CT BONE MARROW BIOPSY & ASPIRATION  Result Date: 08/17/2020 INDICATION: 70 year old male with history of chronic lymphocytic leukemia and worsening anemia. EXAM: CT-GUIDED BONE MARROW BIOPSY AND ASPIRATION MEDICATIONS: None ANESTHESIA/SEDATION: Fentanyl 100 mcg IV; Versed 2 mg IV Sedation Time: 10 minutes; The patient was continuously monitored during the procedure by the interventional radiology nurse under my direct supervision. COMPLICATIONS: None immediate. PROCEDURE: Informed consent was obtained from the patient following an explanation of the procedure, risks, benefits and alternatives. The patient understands, agrees and consents for the procedure. All questions were addressed. A time out was performed prior to the initiation of the procedure. The patient was positioned prone and non-contrast localization CT was performed of the pelvis to demonstrate the iliac marrow spaces. The operative site was prepped and draped in the usual sterile fashion. Under sterile conditions and local anesthesia, a 22 gauge spinal needle was utilized for procedural planning. Next, an 11 gauge coaxial bone biopsy needle was advanced into the right iliac marrow space. Needle position was confirmed with CT imaging. Initially, a bone marrow aspiration was performed. Next, a bone marrow biopsy was  obtained with the 11 gauge outer bone marrow device. Samples were prepared with the cytotechnologist and deemed adequate. The needle was removed and superficial hemostasis was obtained with manual compression. A dressing was applied. The patient tolerated the procedure well without immediate post procedural complication. IMPRESSION: Successful CT guided right iliac bone marrow aspiration and core biopsy. Ruthann Cancer, MD [facility] Electronically Signed   By: Ruthann Cancer MD   On: 08/17/2020 11:48     Assessment and plan- Patient is a 70 y.o. male who presents to symptom management for concerns of an abrasion to right hip after a fall 6 weeks ago.  Stage IV CLL: -Currently on Gazyva. -Appears to be doing fair. -Most recently has developed anemia and will require a bone marrow biopsy.  This is scheduled for 08/17/2020. -He has required Aranesp for a declining hemoglobin for several months now but hemoglobin has started to fall more rapidly recently. -He is scheduled to return to clinic on 08/30/2020 for follow-up with Dr. Rogue Bussing and results of bone  biopsy with possible blood transfusion or Aranesp.   Anemia: -Secondary to disease, tx and CKD. -Labs from 08/16/2020 show a hemoglobin of 6.0. -He is receiving 2 units of PRBC's today.  Abrasion to right hip: -Secondary to a fall approximately 6 weeks ago. -Denies any injury or persistent/chronic pain tenderness.  - Mild erythema noted but overall appears to be healing nicely.  Scabbing is present.  Continue current care.   Disposition: -RTC as scheduled tomorrow for primary biopsy. -RTC on 08/30/2020 to see Dr. Rogue Bussing for results.   Visit Diagnosis 1. CLL (chronic lymphocytic leukemia) ([place])   2. Abrasion of right hip, initial encounter     Patient expressed understanding and was in agreement with this plan. He also understands that He can call clinic at any  time with any  questions, concerns, or complaints.   Greater than 50% was spent in counseling and coordination of care with this patient including but not limited to discussion of the relevant topics above (See A&P) including, but not limited to diagnosis and management of acute and chronic medical conditions.   Thank you for allowing me to participate in the care of this very pleasant patient.    Jacquelin Hawking, NP [place] [facility] Cell - [phone] Pager- [phone] 08/17/2020 1:46 PM

## 2020-08-17 ENCOUNTER — Ambulatory Visit
Admission: RE | Admit: 2020-08-17 | Discharge: 2020-08-17 | Disposition: A | Discharge status: Home / Self Care | Payer: Medicare Other | Source: Ambulatory Visit | Attending: Internal Medicine | Admitting: Internal Medicine

## 2020-08-17 ENCOUNTER — Other Ambulatory Visit: Payer: Self-pay

## 2020-08-17 DIAGNOSIS — F17210 Nicotine dependence, cigarettes, uncomplicated: Secondary | ICD-10-CM | POA: Diagnosis not present

## 2020-08-17 DIAGNOSIS — Z79899 Other long term (current) drug therapy: Secondary | ICD-10-CM | POA: Diagnosis not present

## 2020-08-17 DIAGNOSIS — D649 Anemia, unspecified: Secondary | ICD-10-CM | POA: Insufficient documentation

## 2020-08-17 DIAGNOSIS — C9110 Chronic lymphocytic leukemia of B-cell type not having achieved remission: Secondary | ICD-10-CM | POA: Diagnosis present

## 2020-08-17 LAB — TYPE AND SCREEN
ABO/RH(D): A POS
Antibody Screen: NEGATIVE
Unit division: 0
Unit division: 0

## 2020-08-17 LAB — CBC WITH DIFFERENTIAL/PLATELET
Abs Immature Granulocytes: 0.05 10*3/uL (ref 0.00–0.07)
Basophils Absolute: 0.1 10*3/uL (ref 0.0–0.1)
Basophils Relative: 1 %
Eosinophils Absolute: 0.1 10*3/uL (ref 0.0–0.5)
Eosinophils Relative: 1 %
HCT: 25.5 % — ABNORMAL LOW (ref 39.0–52.0)
Hemoglobin: 8.7 g/dL — ABNORMAL LOW (ref 13.0–17.0)
Immature Granulocytes: 0 %
Lymphocytes Relative: 60 %
Lymphs Abs: 7.1 10*3/uL — ABNORMAL HIGH (ref 0.7–4.0)
MCH: 33.7 pg (ref 26.0–34.0)
MCHC: 34.1 g/dL (ref 30.0–36.0)
MCV: 98.8 fL (ref 80.0–100.0)
Monocytes Absolute: 0.7 10*3/uL (ref 0.1–1.0)
Monocytes Relative: 6 %
Neutro Abs: 3.8 10*3/uL (ref 1.7–7.7)
Neutrophils Relative %: 32 %
Platelets: 204 10*3/uL (ref 150–400)
RBC: 2.58 MIL/uL — ABNORMAL LOW (ref 4.22–5.81)
RDW: 27 % — ABNORMAL HIGH (ref 11.5–15.5)
Smear Review: NORMAL
WBC Morphology: ABNORMAL
WBC: 11.9 10*3/uL — ABNORMAL HIGH (ref 4.0–10.5)
nRBC: 0 % (ref 0.0–0.2)

## 2020-08-17 LAB — BPAM RBC
Blood Product Expiration Date: 202205092359
Blood Product Expiration Date: 202205092359
ISSUE DATE / TIME: 202204201218
ISSUE DATE / TIME: 202204201415
Unit Type and Rh: 6200
Unit Type and Rh: 6200

## 2020-08-17 LAB — GLUCOSE, CAPILLARY: Glucose-Capillary: 76 mg/dL (ref 70–99)

## 2020-08-17 MED ORDER — MIDAZOLAM HCL 2 MG/2ML IJ SOLN
INTRAMUSCULAR | Status: AC
  Filled 2020-08-17: qty 2

## 2020-08-17 MED ORDER — HYDROCODONE-ACETAMINOPHEN 5-325 MG PO TABS: 1.0000 | ORAL_TABLET | ORAL | Status: DC | PRN

## 2020-08-17 MED ORDER — HEPARIN SOD (PORK) LOCK FLUSH 100 UNIT/ML IV SOLN
INTRAVENOUS | Status: AC
  Filled 2020-08-17: qty 5

## 2020-08-17 MED ORDER — FENTANYL CITRATE (PF) 100 MCG/2ML IJ SOLN
INTRAMUSCULAR | Status: AC | PRN
  Administered 2020-08-17 (×2): 50 ug via INTRAVENOUS

## 2020-08-17 MED ORDER — FENTANYL CITRATE (PF) 100 MCG/2ML IJ SOLN
INTRAMUSCULAR | Status: AC
  Filled 2020-08-17: qty 2

## 2020-08-17 MED ORDER — MIDAZOLAM HCL 2 MG/2ML IJ SOLN
INTRAMUSCULAR | Status: AC | PRN
  Administered 2020-08-17 (×2): 1 mg via INTRAVENOUS

## 2020-08-17 MED ORDER — SODIUM CHLORIDE 0.9 % IV SOLN: INTRAVENOUS | Status: DC

## 2020-08-17 NOTE — Progress Notes (Signed)
Patient clinically stable post BMB per Dr Serafina Royals, tolerated well. Denies complaints post procedure. Son at bedside. Awake/alert and oriented post procedure. Report given to [facility] post procedure/specials. Received Versed 2 mg along with Fentanyl 100 mcg IV for procedure.

## 2020-08-17 NOTE — Discharge Instructions (Signed)
Moderate Conscious Sedation, Adult, Care After This sheet gives you information about how to care for yourself after your procedure. Your health care provider may also give you more specific instructions. If you have problems or questions, contact your health care provider. What can I expect after the procedure? After the procedure, it is common to have:  Sleepiness for several hours.  Impaired judgment for several hours.  Difficulty with balance.  Vomiting if you eat too soon. Follow these instructions at home: For the time period you were told by your health care provider:  Rest.  Do not participate in activities where you could fall or become injured.  Do not drive or use machinery.  Do not drink alcohol.  Do not take sleeping pills or medicines that cause drowsiness.  Do not make important decisions or sign legal documents.  Do not take care of children on your own.      Eating and drinking  Follow the diet recommended by your health care provider.  Drink enough fluid to keep your urine pale yellow.  If you vomit: ? Drink water, juice, or soup when you can drink without vomiting. ? Make sure you have little or no nausea before eating solid foods.   General instructions  Take over-the-counter and prescription medicines only as told by your health care provider.  Have a responsible adult stay with you for the time you are told. It is important to have someone help care for you until you are awake and alert.  Do not smoke.  Keep all follow-up visits as told by your health care provider. This is important. Contact a health care provider if:  You are still sleepy or having trouble with balance after 24 hours.  You feel light-headed.  You keep feeling nauseous or you keep vomiting.  You develop a rash.  You have a fever.  You have redness or swelling around the IV site. Get help right away if:  You have trouble breathing.  You have new-onset confusion at  home. Summary  After the procedure, it is common to feel sleepy, have impaired judgment, or feel nauseous if you eat too soon.  Rest after you get home. Know the things you should not do after the procedure.  Follow the diet recommended by your health care provider and drink enough fluid to keep your urine pale yellow.  Get help right away if you have trouble breathing or new-onset confusion at home. This information is not intended to replace advice given to you by your health care provider. Make sure you discuss any questions you have with your health care provider. Document Revised: 08/13/2019 Document Reviewed: 03/11/2019 Elsevier Patient Education  [place]. Bone Marrow Aspiration and Bone Marrow Biopsy, Adult, Care After This sheet gives you information about how to care for yourself after your procedure. Your health care provider may also give you more specific instructions. If you have problems or questions, contact your health care provider. What can I expect after the procedure? After the procedure, it is common to have:  Mild pain and tenderness.  Swelling.  Bruising. Follow these instructions at home: Puncture site care  Follow instructions from your health care provider about how to take care of the puncture site. Make sure you: ? Wash your hands with soap and water before and after you change your bandage (dressing). If soap and water are not available, use hand sanitizer. ? Change your dressing as told by your health care provider.  Check  your puncture site every day for signs of infection. Check for: ? More redness, swelling, or pain. ? Fluid or blood. ? Warmth. ? Pus or a bad smell.   Activity  Return to your normal activities as told by your health care provider. Ask your health care provider what activities are safe for you.  Do not lift anything that is heavier than 10 lb (4.5 kg), or the limit that you are told, until your health care provider  says that it is safe.  Do not drive for 24 hours if you were given a sedative during your procedure. General instructions  Take over-the-counter and prescription medicines only as told by your health care provider.  Do not take baths, swim, or use a hot tub until your health care provider approves. Ask your health care provider if you may take showers. You may only be allowed to take sponge baths.  If directed, put ice on the affected area. To do this: ? Put ice in a plastic bag. ? Place a towel between your skin and the bag. ? Leave the ice on for 20 minutes, 2-3 times a day.  Keep all follow-up visits as told by your health care provider. This is important.   Contact a health care provider if:  Your pain is not controlled with medicine.  You have a fever.  You have more redness, swelling, or pain around the puncture site.  You have fluid or blood coming from the puncture site.  Your puncture site feels warm to the touch.  You have pus or a bad smell coming from the puncture site. Summary  After the procedure, it is common to have mild pain, tenderness, swelling, and bruising.  Follow instructions from your health care provider about how to take care of the puncture site and what activities are safe for you.  Take over-the-counter and prescription medicines only as told by your health care provider.  Contact a health care provider if you have any signs of infection, such as fluid or blood coming from the puncture site. This information is not intended to replace advice given to you by your health care provider. Make sure you discuss any questions you have with your health care provider. Document Revised: 09/01/2018 Document Reviewed: 09/01/2018 Elsevier Patient Education  [place].

## 2020-08-17 NOTE — Procedures (Signed)
Interventional Radiology Procedure Note  Procedure: CT guided aspirate and core biopsy of right iliac bone  Complications: None  Recommendations: - Bedrest supine x 1 hrs - Hydrocodone PRN  Pain - Follow biopsy results   Renald Haithcock, MD   

## 2020-08-17 NOTE — H&P (Signed)
Chief Complaint: Patient was seen in consultation today for CT guided bone marrow biopsy  Referring Physician(s): Brahmanday,Govinda R  Patient Status: [facility] - Out-pt  History of Present Illness: James [name] is a 70 y.o. male with history of chronic lymphocytic leukemia with worsening anemia presenting for bone marrow biopsy for further evaluation.  Feels well today with complaints of right hip/flank pain after a recent fall.  No fevers, chills, shortness of breath, chest pain, abdominal pain, nausea, vomiting.  Past Medical History:  Diagnosis Date  . CLL (chronic lymphocytic leukemia) ([place])   . Constipation   . Depression   . Diabetes mellitus without complication ([place])   . GERD (gastroesophageal reflux disease)   . Hematuria   . Hyperlipidemia   . Hypertension   . Therapeutic opioid induced constipation     Past Surgical History:  Procedure Laterality Date  . CATARACT EXTRACTION W/ INTRAOCULAR LENS IMPLANT Bilateral   . CHOLECYSTECTOMY  1983  . COLONOSCOPY WITH PROPOFOL N/A 10/27/2017   Procedure: COLONOSCOPY WITH PROPOFOL;  Surgeon: Manya Silvas, MD;  Location: [facility] ENDOSCOPY;  Service: Endoscopy;  Laterality: N/A;  . ESOPHAGOGASTRODUODENOSCOPY (EGD) WITH PROPOFOL N/A 11/20/2016   Procedure: ESOPHAGOGASTRODUODENOSCOPY (EGD) WITH PROPOFOL;  Surgeon: Manya Silvas, MD;  Location: [facility] ENDOSCOPY;  Service: Endoscopy;  Laterality: N/A;  . ESOPHAGOGASTRODUODENOSCOPY (EGD) WITH PROPOFOL N/A 10/27/2017   Procedure: ESOPHAGOGASTRODUODENOSCOPY (EGD) WITH PROPOFOL;  Surgeon: Manya Silvas, MD;  Location: [facility] ENDOSCOPY;  Service: Endoscopy;  Laterality: N/A;  . INTRATHECAL PUMP IMPLANT Left 02/07/2020   Procedure: INTRATHECAL PUMP & CATHETER IMPLANT;  Surgeon: Deetta Perla, MD;  Location: [facility] ORS;  Service: Neurosurgery;  Laterality: Left;  . LOWER EXTREMITY ANGIOGRAPHY Right 05/19/2017   Procedure: LOWER EXTREMITY ANGIOGRAPHY;  Surgeon: Algernon Huxley, MD;  Location:  [place] CV LAB;  Service: Cardiovascular;  Laterality: Right;  . LOWER EXTREMITY ANGIOGRAPHY Left 08/10/2018   Procedure: LOWER EXTREMITY ANGIOGRAPHY;  Surgeon: Algernon Huxley, MD;  Location: [place] CV LAB;  Service: Cardiovascular;  Laterality: Left;    Allergies: Patient has no known allergies.  Medications: Prior to Admission medications   Medication Sig Start Date End Date Taking? Authorizing Provider  atorvastatin (LIPITOR) 10 MG tablet Take 1 tablet (10 mg total) by mouth daily. 05/19/17 06/12/27 Yes Dew, Erskine Squibb, MD  clopidogrel (PLAVIX) 75 MG tablet Take 75 mg by mouth daily.  02/12/20  Yes [provider]  Ensure (ENSURE) Take 237 mLs by mouth 3 (three) times daily between meals.    Yes [provider]  folic acid (FOLVITE) 1 MG tablet Take 1 tablet (1 mg total) by mouth daily. 11/09/19  Yes Cammie Sickle, MD  gabapentin (NEURONTIN) 300 MG capsule Take 3 capsules (900 mg total) by mouth at bedtime AND 1 capsule (300 mg total) 2 (two) times daily. 07/31/20 10/29/20 Yes Milinda Pointer, MD  metFORMIN (GLUCOPHAGE) 1000 MG tablet Take 1 tablet (1,000 mg total) by mouth daily with breakfast. 07/18/20  Yes Cammie Sickle, MD  methocarbamol (ROBAXIN) 500 MG tablet Take 1 tablet (500 mg total) by mouth every 6 (six) hours as needed for muscle spasms. 02/07/20  Yes Zdeb, Altha Harm, NP  mirtazapine (REMERON) 30 MG tablet Take 1 tablet (30 mg total) by mouth at bedtime. 07/18/20  Yes Cammie Sickle, MD  naloxegol oxalate (MOVANTIK) 25 MG TABS tablet Take 1 tablet (25 mg total) by mouth daily. 08/06/19  Yes Verlon Au, NP  Oxycodone HCl 10 MG TABS Take 1  tablet (10 mg total) by mouth every 4 (four) hours as needed. Must last 30 days 08/10/20 09/09/20 Yes Milinda Pointer, MD  pantoprazole (PROTONIX) 40 MG tablet Take 40 mg by mouth at bedtime. 12/24/19  Yes [provider]  naloxone (NARCAN) 2 MG/2ML injection Inject 1 mL (1 mg total) into the  muscle as needed for up to 2 doses (for opioid overdose). In case of emergency (overdose), inject into muscle of upper arm or leg and call 911. 03/02/20   Milinda Pointer, MD  Oxycodone HCl 10 MG TABS Take 1 tablet (10 mg total) by mouth every 4 (four) hours as needed. Must last 30 days 07/11/20 08/10/20  Milinda Pointer, MD  Oxycodone HCl 10 MG TABS Take 1 tablet (10 mg total) by mouth every 4 (four) hours as needed. Must last 30 days 09/09/20 10/09/20  Milinda Pointer, MD     Family History  Problem Relation Age of Onset  . Hypertension Sister     Social History   Socioeconomic History  . Marital status: Married    Spouse name: [name]  . Number of children: 1  . Years of education: Not on file  . Highest education level: Not on file  Occupational History  . Occupation: retired    Comment: truck Geophysicist/field seismologist  Tobacco Use  . Smoking status: Current Every Day Smoker    Packs/day: 0.50    Years: 47.00    Pack years: 23.50    Types: Cigarettes  . Smokeless tobacco: Never Used  Vaping Use  . Vaping Use: Never used  Substance and Sexual Activity  . Alcohol use: Yes    Alcohol/week: 1.0 standard drink    Types: 1 Glasses of wine per week    Comment:  almost none in last 6 months  . Drug use: No  . Sexual activity: Never    Partners: Female  Other Topics Concern  . Not on file  Social History Narrative  . Not on file   Social Determinants of Health   Financial Resource Strain: Not on file  Food Insecurity: Not on file  Transportation Needs: Not on file  Physical Activity: Not on file  Stress: Not on file  Social Connections: Not on file    Review of Systems: A 12 point ROS discussed and pertinent positives are indicated in the HPI above.  All other systems are negative.  Vital Signs: BP (!) 144/69   Pulse 78   Temp 98.2 F (36.8 C) (Oral)   Resp 20   Ht 5' 8"  (1.727 m)   Wt 65.8 kg   SpO2 100%   BMI 22.05 kg/m   Physical Exam Constitutional:       Appearance: Normal appearance.  HENT:     Head: Normocephalic.     Mouth/Throat:     Mouth: Mucous membranes are moist.     Comments: MP2  Cardiovascular:     Rate and Rhythm: Normal rate and regular rhythm.     Heart sounds: Normal heart sounds.  Pulmonary:     Effort: No respiratory distress.     Breath sounds: Normal breath sounds.  Abdominal:     General: There is no distension.  Musculoskeletal:        General: No swelling.  Skin:    General: Skin is warm and dry.  Neurological:     Mental Status: He is alert and oriented to person, place, and time.     Imaging: No results found.  Labs:  CBC: Recent  Labs    05/23/20 0935 06/15/20 1112 07/04/20 1043 07/18/20 1045 08/16/20 0911  WBC 6.1 8.0  --  7.8 6.7  HGB 8.4* 7.3* 7.2* 6.6* 6.0*  HCT 26.1* 22.4* 22.3* 20.7* 19.0*  PLT 168 174  --  253 219    COAGS: Recent Labs    02/02/20 1021  INR 1.0  APTT 27    BMP: Recent Labs    11/09/19 0800 12/07/19 0812 01/04/20 0932 01/24/20 1156 02/01/20 0940 05/23/20 0935 06/15/20 1112 07/18/20 1045 08/16/20 0911  NA 138 139 139 138   < > 138 140 138 140  K 4.5 5.2* 4.9 6.2*   < > 5.1 5.1 3.9 4.0  CL 103 103 102 102   < > 101 105 103 109  CO2 28 25 26 26    < > 26 26 24  18*  GLUCOSE 146* 166* 163* 160*   < > 142* 127* 109* 101*  BUN 31* 27* 36* 37*   < > 27* 24* 19 21  CALCIUM 9.2 9.3 9.2 9.2   < > 9.3 9.0 9.2 9.2  CREATININE 1.14 1.12 1.16 1.47*   < > 1.22 0.93 1.25* 1.26*  GFRNONAA >60 >60 >60 48*   < > >60 >60 >60 >60  GFRAA >60 >60 >60 55*  --   --   --   --   --    < > = values in this interval not displayed.    LIVER FUNCTION TESTS: Recent Labs    03/28/20 0951 05/23/20 0935 06/15/20 1112 07/18/20 1045  BILITOT 0.6 0.9 0.8 1.0  AST 15 15 12* 12*  ALT 19 10 10 9   ALKPHOS 63 59 58 77  PROT 7.1 7.0 6.7 7.4  ALBUMIN 4.7 5.0 4.3 4.8    TUMOR MARKERS: No results for input(s): AFPTM, CEA, CA199, CHROMGRNA in the last 8760 hours.  Assessment  and Plan:  70 year old male with history of chronic lymphocytic leukemia and worsening anemia.  Plan for CT guided bone marrow biopsy with moderate sedation.  Risks and benefits of bone marrow biopsy was discussed with the patient and/or patient's family including, but not limited to bleeding, infection, damage to adjacent structures or low yield requiring additional tests.  All of the questions were answered and there is agreement to proceed.  Consent signed and in chart.    Electronically Signed: Suzette Battiest, MD 08/17/2020, 8:03 AM   I spent a total of  15 Minutes   in face to face in clinical consultation, greater than 50% of which was counseling/coordinating care for bone marrow biopsy.

## 2020-08-18 LAB — SURGICAL PATHOLOGY

## 2020-08-22 ENCOUNTER — Ambulatory Visit (INDEPENDENT_AMBULATORY_CARE_PROVIDER_SITE_OTHER): Payer: Medicare Other | Admitting: Vascular Surgery

## 2020-08-22 ENCOUNTER — Other Ambulatory Visit: Payer: Self-pay

## 2020-08-22 ENCOUNTER — Ambulatory Visit (INDEPENDENT_AMBULATORY_CARE_PROVIDER_SITE_OTHER): Payer: Medicare Other

## 2020-08-22 ENCOUNTER — Encounter (INDEPENDENT_AMBULATORY_CARE_PROVIDER_SITE_OTHER): Payer: Self-pay | Admitting: Vascular Surgery

## 2020-08-22 VITALS — BP 105/72 | HR 75 | Resp 16 | Wt 140.0 lb

## 2020-08-22 DIAGNOSIS — E119 Type 2 diabetes mellitus without complications: Secondary | ICD-10-CM

## 2020-08-22 DIAGNOSIS — I10 Essential (primary) hypertension: Secondary | ICD-10-CM

## 2020-08-22 DIAGNOSIS — E785 Hyperlipidemia, unspecified: Secondary | ICD-10-CM

## 2020-08-22 DIAGNOSIS — I70219 Atherosclerosis of native arteries of extremities with intermittent claudication, unspecified extremity: Secondary | ICD-10-CM

## 2020-08-22 DIAGNOSIS — C8300 Small cell B-cell lymphoma, unspecified site: Secondary | ICD-10-CM

## 2020-08-22 NOTE — Progress Notes (Signed)
MRN : [id number]  James [name] is a 70 y.o. ([birth year]) male who presents with chief complaint of  Chief Complaint  Patient presents with  . Follow-up    6 month abi  .  History of Present Illness: Patient returns today in follow up of his PAD.  His claudication is basically stable.  No limb threatening symptoms of rest pain or ulceration.  No major changes since his last visit 6 months ago. ABIs today are 0.67 on the right and 0.89 on the left, slightly down from last study.  Current Outpatient Medications  Medication Sig Dispense Refill  . atorvastatin (LIPITOR) 10 MG tablet Take 1 tablet (10 mg total) by mouth daily. 30 tablet 11  . clopidogrel (PLAVIX) 75 MG tablet Take 75 mg by mouth daily.     . Ensure (ENSURE) Take 237 mLs by mouth 3 (three) times daily between meals.     . folic acid (FOLVITE) 1 MG tablet Take 1 tablet (1 mg total) by mouth daily. 90 tablet 1  . gabapentin (NEURONTIN) 300 MG capsule Take 3 capsules (900 mg total) by mouth at bedtime AND 1 capsule (300 mg total) 2 (two) times daily. 150 capsule 2  . metFORMIN (GLUCOPHAGE) 1000 MG tablet Take 1 tablet (1,000 mg total) by mouth daily with breakfast. 30 tablet 3  . methocarbamol (ROBAXIN) 500 MG tablet Take 1 tablet (500 mg total) by mouth every 6 (six) hours as needed for muscle spasms. 80 tablet 0  . mirtazapine (REMERON) 30 MG tablet Take 1 tablet (30 mg total) by mouth at bedtime. 30 tablet 3  . naloxegol oxalate (MOVANTIK) 25 MG TABS tablet Take 1 tablet (25 mg total) by mouth daily. 30 tablet 0  . naloxone (NARCAN) 2 MG/2ML injection Inject 1 mL (1 mg total) into the muscle as needed for up to 2 doses (for opioid overdose). In case of emergency (overdose), inject into muscle of upper arm or leg and call 911. 2 mL 0  . Oxycodone HCl 10 MG TABS Take 1 tablet (10 mg total) by mouth every 4 (four) hours as needed. Must last 30 days 180 tablet 0  . [START ON 09/09/2020] Oxycodone HCl 10 MG TABS Take 1 tablet (10  mg total) by mouth every 4 (four) hours as needed. Must last 30 days 180 tablet 0  . pantoprazole (PROTONIX) 40 MG tablet Take 40 mg by mouth at bedtime.    . Oxycodone HCl 10 MG TABS Take 1 tablet (10 mg total) by mouth every 4 (four) hours as needed. Must last 30 days 180 tablet 0   No current facility-administered medications for this visit.    Past Medical History:  Diagnosis Date  . CLL (chronic lymphocytic leukemia) ([place])   . Constipation   . Depression   . Diabetes mellitus without complication ([place])   . GERD (gastroesophageal reflux disease)   . Hematuria   . Hyperlipidemia   . Hypertension   . Therapeutic opioid induced constipation     Past Surgical History:  Procedure Laterality Date  . CATARACT EXTRACTION W/ INTRAOCULAR LENS IMPLANT Bilateral   . CHOLECYSTECTOMY  1983  . COLONOSCOPY WITH PROPOFOL N/A 10/27/2017   Procedure: COLONOSCOPY WITH PROPOFOL;  Surgeon: Manya Silvas, MD;  Location[facility] ENDOSCOPY;  Service: Endoscopy;  Laterality: N/A;  . ESOPHAGOGASTRODUODENOSCOPY (EGD) WITH PROPOFOL N/A 11/20/2016   Procedure: ESOPHAGOGASTRODUODENOSCOPY (EGD) WITH PROPOFOL;  Surgeon: Manya Silvas, MD;  Location: [facility] ENDOSCOPY;  Service: Endoscopy;  Laterality: N/A;  .  ESOPHAGOGASTRODUODENOSCOPY (EGD) WITH PROPOFOL N/A 10/27/2017   Procedure: ESOPHAGOGASTRODUODENOSCOPY (EGD) WITH PROPOFOL;  Surgeon: Manya Silvas, MD;  Location: [facility] ENDOSCOPY;  Service: Endoscopy;  Laterality: N/A;  . INTRATHECAL PUMP IMPLANT Left 02/07/2020   Procedure: INTRATHECAL PUMP & CATHETER IMPLANT;  Surgeon: Deetta Perla, MD;  Location: [facility] ORS;  Service: Neurosurgery;  Laterality: Left;  . LOWER EXTREMITY ANGIOGRAPHY Right 05/19/2017   Procedure: LOWER EXTREMITY ANGIOGRAPHY;  Surgeon: Algernon Huxley, MD;  Location: [facility] CV LAB;  Service: Cardiovascular;  Laterality: Right;  . LOWER EXTREMITY ANGIOGRAPHY Left 08/10/2018   Procedure: LOWER EXTREMITY ANGIOGRAPHY;  Surgeon: Algernon Huxley,  MD;  Location: [place] CV LAB;  Service: Cardiovascular;  Laterality: Left;     Social History        Tobacco Use  . Smoking status: Current Every Day Smoker    Packs/day: 0.50    Years: 47.00    Pack years: 23.50    Types: Cigarettes  . Smokeless tobacco: Never Used  Substance Use Topics  . Alcohol use: Yes    Alcohol/week: 1.0 standard drinks    Types: 1 Glasses of wine per week    Comment: almost none in last 6 months  . Drug use: No    Family History      Family History  Problem Relation Age of Onset  . Hypertension Sister   no bleeding disorders, clotting disorders, or aneurysms  No Known Allergies  REVIEW OF SYSTEMS(Negative unless checked)  Constitutional: _0 ???????Weight loss_1 ???????Fever_2 ???????Chills Cardiac:_3 ???????Chest pain_4 ???????Chest pressure_5 ???????Palpitations _6 ???????Shortness of breath when laying flat _7 ???????Shortness of breath at rest _8 ???????Shortness of breath with exertion. Vascular: _9 ???????Pain in legs with walking_10 ???????Pain in legsat rest_11 ???????Pain in legs when laying flat _12 ???????Claudication _13 ???????Pain in feet when walking _14 ???????Pain in feet at rest _15 ???????Pain in feet when laying flat _16 ???????History of DVT _17 ???????Phlebitis _18 ???????Swelling in legs _19 ???????Varicose veins _20 ???????Non-healing ulcers Pulmonary: _21 ???????Uses home oxygen _22 ???????Productive cough_23 ???????Hemoptysis _24 ???????Wheeze _25 ???????COPD _26 ???????Asthma Neurologic: _27 ???????Dizziness _28 ???????Blackouts _29 ???????Seizures _30 ???????History of stroke _31 ???????History of TIA_32 ???????Aphasia _33 ???????Temporary blindness_34 ???????Dysphagia _35 ???????Weaknessor numbness in arms _36 ???????Weakness or numbnessin legs Musculoskeletal: _37 ???????Arthritis _38 ???????Joint swelling _39 ???????Joint pain _40 ???????Low back  pain Hematologic:_41 ???????Easy bruising_42 ???????Easy bleeding _43 ???????Hypercoagulable state _44 ???????Anemic  Gastrointestinal:_45 ???????Blood in stool_46 ???????Vomiting blood_47 ???????Gastroesophageal reflux/heartburn_48 ???????Abdominal pain Genitourinary: _49 ???????Chronic kidney disease _50 ???????Difficulturination _51 ???????Frequenturination _52 ???????Burning with urination_53 ???????Hematuria Skin: _54 ???????Rashes _55 ???????Ulcers _56 ???????Wounds Psychological: _57 ???????History of anxiety_58 ???????History of major depression.     Physical Examination  BP 105/72 (BP Location: Right Arm)   Pulse 75   Resp 16   Wt 140 lb (63.5 kg)   BMI 21.29 kg/m  Gen:  WD/WN, NAD Head: [place]/AT, No temporalis wasting. Ear/Nose/Throat: Hearing grossly intact, nares w/o erythema or drainage Eyes: Conjunctiva clear. Sclera non-icteric Neck: Supple.  Trachea midline Pulmonary:  Good air movement, no use of accessory muscles.  Cardiac: RRR, no JVD Vascular:  Vessel Right Left  Radial Palpable Palpable                          PT 1+ Palpable 1+ Palpable  DP 1+ Palpable 2+ Palpable   Gastrointestinal: soft, non-tender/non-distended. No guarding/reflex.  Musculoskeletal: M/S 5/5 throughout.  No deformity or atrophy. No LE edema. Neurologic: Sensation grossly intact in extremities.  Symmetrical.  Speech is fluent.  Psychiatric: Judgment intact, Mood & affect appropriate for pt's clinical situation. Dermatologic: No rashes or ulcers noted.  No cellulitis or open wounds.       Labs Recent Results (from the past 2160 hour(s))  Hold Tube- Blood Bank     Status: None   Collection Time: 06/15/20 11:12  AM  Result Value Ref Range   Blood Bank Specimen SAMPLE AVAILABLE FOR TESTING    Sample Expiration      06/18/2020,2359 Performed at [facility], [address] [place], [place] [address]   Iron and TIBC     Status: Abnormal   Collection Time:  06/15/20 11:12 AM  Result Value Ref Range   Iron 145 45 - 182 ug/dL   TIBC 356 250 - 450 ug/dL   Saturation Ratios 41 (H) 17.9 - 39.5 %   UIBC 211 ug/dL    Comment: Performed at [facility], [address] [place], [place] [address]  Ferritin     Status: None   Collection Time: 06/15/20 11:12 AM  Result Value Ref Range   Ferritin 90 24 - 336 ng/mL    Comment: Performed at [facility], [address] [place], [place] [address]  Lactate dehydrogenase     Status: None   Collection Time: 06/15/20 11:12 AM  Result Value Ref Range   LDH 110 98 - 192 U/L    Comment: Performed at [facility], [address] [place], [place] [address]  Comprehensive metabolic panel     Status: Abnormal   Collection Time: 06/15/20 11:12 AM  Result Value Ref Range   Sodium 140 135 - 145 mmol/L   Potassium 5.1 3.5 - 5.1 mmol/L   Chloride 105 98 - 111 mmol/L   CO2 26 22 - 32 mmol/L   Glucose, Bld 127 (H) 70 - 99 mg/dL    Comment: Glucose reference range applies only to samples taken after fasting for at least 8 hours.   BUN 24 (H) 8 - 23 mg/dL   Creatinine, Ser 0.93 0.61 - 1.24 mg/dL   Calcium 9.0 8.9 - 10.3 mg/dL   Total Protein 6.7 6.5 - 8.1 g/dL   Albumin 4.3 3.5 - 5.0 g/dL   AST 12 (L) 15 - 41 U/L   ALT 10 0 - 44 U/L   Alkaline Phosphatase 58 38 - 126 U/L   Total Bilirubin 0.8 0.3 - 1.2 mg/dL   GFR, Estimated >60 >60 mL/min    Comment: (NOTE) Calculated using the CKD-EPI Creatinine Equation (2021)    Anion gap 9 5 - 15    Comment: Performed at [facility], [address] [place], [place] [address]  CBC with Differential     Status: Abnormal   Collection Time: 06/15/20 11:12 AM  Result Value Ref Range   WBC 8.0 4.0 - 10.5 K/uL   RBC 2.04 (L) 4.22 - 5.81 MIL/uL   Hemoglobin 7.3 (L) 13.0 - 17.0 g/dL   HCT 22.4 (L) 39.0 - 52.0 %   MCV 109.8 (H) 80.0 - 100.0 fL   MCH 35.8 (H) 26.0 - 34.0 pg   MCHC 32.6 30.0 - 36.0 g/dL   RDW 18.1 (H) 11.5 - 15.5 %    Platelets 174 150 - 400 K/uL   nRBC 0.0 0.0 - 0.2 %   Neutrophils Relative % 33 %   Neutro Abs 2.7 1.7 - 7.7 K/uL   Lymphocytes Relative 55 %   Lymphs Abs 4.5 (H) 0.7 - 4.0 K/uL   Monocytes Relative 10 %   Monocytes Absolute 0.8 0.1 - 1.0 K/uL   Eosinophils Relative 1 %   Eosinophils Absolute 0.1 0.0 - 0.5 K/uL   Basophils Relative 1 %   Basophils Absolute 0.0 0.0 - 0.1 K/uL   Immature Granulocytes 0 %   Abs Immature Granulocytes  0.02 0.00 - 0.07 K/uL    Comment: Performed at [facility], [address] [place], [place] [address]  Type and screen     Status: None   Collection Time: 06/15/20 11:12 AM  Result Value Ref Range   ABO/RH(D) A POS    Antibody Screen NEG    Sample Expiration 06/18/2020,2359    Unit Number [id number]    Blood Component Type RBC, LR IRR    Unit division 00    Status of Unit ISSUED,FINAL    Unit tag comment IRRADIATED PRODUCT    Transfusion Status OK TO TRANSFUSE    Crossmatch Result      Compatible Performed at [facility], [address] [place], [place] [address]   BPAM RBC     Status: None   Collection Time: 06/15/20 11:12 AM  Result Value Ref Range   ISSUE DATE / TIME 732202542706    Blood Product Unit Number [id number]    PRODUCT CODE Y0737T06    Unit Type and Rh 6200    Blood Product Expiration Date [id number]   Prepare RBC (crossmatch)     Status: None   Collection Time: 06/15/20 12:30 PM  Result Value Ref Range   Order Confirmation      ORDER PROCESSED BY BLOOD BANK Performed at [facility], [address] [place], [place] [address]   Hemoglobin and Hematocrit, Blood     Status: Abnormal   Collection Time: 07/04/20 10:43 AM  Result Value Ref Range   Hemoglobin 7.2 (L) 13.0 - 17.0 g/dL   HCT 22.3 (L) 39.0 - 52.0 %    Comment: Performed at [facility], [address] [place], [place] [address]  Glucose, capillary     Status: None   Collection Time: 07/11/20  9:29 AM  Result Value Ref  Range   Glucose-Capillary 76 70 - 99 mg/dL    Comment: Glucose reference range applies only to samples taken after fasting for at least 8 hours.  Iron and TIBC     Status: Abnormal   Collection Time: 07/18/20 10:45 AM  Result Value Ref Range   Iron 195 (H) 45 - 182 ug/dL   TIBC 389 250 - 450 ug/dL   Saturation Ratios 50 (H) 17.9 - 39.5 %   UIBC 194 ug/dL    Comment: Performed at [facility], [address] [place], [place] [address]  Ferritin     Status: None   Collection Time: 07/18/20 10:45 AM  Result Value Ref Range   Ferritin 144 24 - 336 ng/mL    Comment: Performed at [facility], [address] [place], [place] [address]  Lactate dehydrogenase     Status: None   Collection Time: 07/18/20 10:45 AM  Result Value Ref Range   LDH 109 98 - 192 U/L    Comment: Performed at [facility], [address] [place],  [address]  Comprehensive metabolic panel     Status: Abnormal   Collection Time: 07/18/20 10:45 AM  Result Value Ref Range   Sodium 138 135 - 145 mmol/L   Potassium 3.9 3.5 - 5.1 mmol/L   Chloride 103 98 - 111 mmol/L   CO2 24 22 - 32 mmol/L   Glucose, Bld 109 (H) 70 - 99 mg/dL    Comment: Glucose reference range applies only to samples taken after fasting for at least 8 hours.   BUN 19 8 - 23 mg/dL   Creatinine, Ser 1.25 (H) 0.61 -  1.24 mg/dL   Calcium 9.2 8.9 - 10.3 mg/dL   Total Protein 7.4 6.5 - 8.1 g/dL   Albumin 4.8 3.5 - 5.0 g/dL   AST 12 (L) 15 - 41 U/L   ALT 9 0 - 44 U/L   Alkaline Phosphatase 77 38 - 126 U/L   Total Bilirubin 1.0 0.3 - 1.2 mg/dL   GFR, Estimated >60 >60 mL/min    Comment: (NOTE) Calculated using the CKD-EPI Creatinine Equation (2021)    Anion gap 11 5 - 15    Comment: Performed at [facility], [address] [place], [place] [address]  CBC with Differential     Status: Abnormal   Collection Time: 07/18/20 10:45 AM  Result Value Ref Range   WBC 7.8 4.0 - 10.5 K/uL   RBC 1.85 (L) 4.22 -  5.81 MIL/uL   Hemoglobin 6.6 (L) 13.0 - 17.0 g/dL   HCT 20.7 (L) 39.0 - 52.0 %   MCV 111.9 (H) 80.0 - 100.0 fL   MCH 35.7 (H) 26.0 - 34.0 pg   MCHC 31.9 30.0 - 36.0 g/dL   RDW 19.6 (H) 11.5 - 15.5 %   Platelets 253 150 - 400 K/uL   nRBC 0.3 (H) 0.0 - 0.2 %   Neutrophils Relative % 25 %   Neutro Abs 2.0 1.7 - 7.7 K/uL   Lymphocytes Relative 45 %   Lymphs Abs 3.5 0.7 - 4.0 K/uL   Monocytes Relative 28 %   Monocytes Absolute 2.2 (H) 0.1 - 1.0 K/uL   Eosinophils Relative 1 %   Eosinophils Absolute 0.0 0.0 - 0.5 K/uL   Basophils Relative 1 %   Basophils Absolute 0.1 0.0 - 0.1 K/uL   WBC Morphology DIFF. CONFIRMED BY SMEAR    RBC Morphology MORPHOLOGY UNREMARKABLE    Smear Review Normal platelet morphology     Comment: PLATELETS APPEAR ADEQUATE   Immature Granulocytes 0 %   Abs Immature Granulocytes 0.03 0.00 - 0.07 K/uL    Comment: Performed at [facility], [address] [place], [place] [address]  Type and screen     Status: None   Collection Time: 07/18/20 10:45 AM  Result Value Ref Range   ABO/RH(D) A POS    Antibody Screen NEG    Sample Expiration 07/21/2020,2359    Unit Number [id number]    Blood Component Type RBC, LR IRR    Unit division 00    Status of Unit ISSUED,FINAL    Transfusion Status OK TO TRANSFUSE    Crossmatch Result      Compatible Performed at [facility], [address] [place], [place] [address]   BPAM RBC     Status: None   Collection Time: 07/18/20 10:45 AM  Result Value Ref Range   ISSUE DATE / TIME 716967893810    Blood Product Unit Number [id number]    PRODUCT CODE [id number]    Unit Type and Rh 6200    Blood Product Expiration Date [id number]   Prepare RBC (crossmatch)     Status: None   Collection Time: 07/18/20 11:49 AM  Result Value Ref Range   Order Confirmation      ORDER PROCESSED BY BLOOD BANK Performed at [facility], [address] [place], [place] [address]   Basic metabolic panel      Status: Abnormal   Collection Time: 08/16/20  9:11 AM  Result Value Ref Range   Sodium 140 135 - 145 mmol/L   Potassium 4.0 3.5 -  5.1 mmol/L   Chloride 109 98 - 111 mmol/L   CO2 18 (L) 22 - 32 mmol/L   Glucose, Bld 101 (H) 70 - 99 mg/dL    Comment: Glucose reference range applies only to samples taken after fasting for at least 8 hours.   BUN 21 8 - 23 mg/dL   Creatinine, Ser 1.26 (H) 0.61 - 1.24 mg/dL   Calcium 9.2 8.9 - 10.3 mg/dL   GFR, Estimated >60 >60 mL/min    Comment: (NOTE) Calculated using the CKD-EPI Creatinine Equation (2021)    Anion gap 13 5 - 15    Comment: Performed at [facility], [address] [place], [place] [address]  Hold Tube- Blood Bank     Status: None   Collection Time: 08/16/20  9:11 AM  Result Value Ref Range   Blood Bank Specimen SAMPLE AVAILABLE FOR TESTING    Sample Expiration      08/19/2020,2359 Performed at [facility], [address] [place], [place] [address]   CBC with Differential     Status: Abnormal   Collection Time: 08/16/20  9:11 AM  Result Value Ref Range   WBC 6.7 4.0 - 10.5 K/uL   RBC 1.73 (L) 4.22 - 5.81 MIL/uL   Hemoglobin 6.0 (L) 13.0 - 17.0 g/dL   HCT 19.0 (L) 39.0 - 52.0 %   MCV 109.8 (H) 80.0 - 100.0 fL   MCH 34.7 (H) 26.0 - 34.0 pg   MCHC 31.6 30.0 - 36.0 g/dL   RDW 22.3 (H) 11.5 - 15.5 %   Platelets 219 150 - 400 K/uL   nRBC 0.0 0.0 - 0.2 %   Neutrophils Relative % 26 %   Neutro Abs 1.9 1.7 - 7.7 K/uL   Band Neutrophils 2 %   Lymphocytes Relative 56 %   Lymphs Abs 3.8 0.7 - 4.0 K/uL   Monocytes Relative 12 %   Monocytes Absolute 0.8 0.1 - 1.0 K/uL   Eosinophils Relative 2 %   Eosinophils Absolute 0.1 0.0 - 0.5 K/uL   Basophils Relative 2 %   Basophils Absolute 0.1 0.0 - 0.1 K/uL   WBC Morphology DIFF CONFIRMED, CONSISTANT WITH KNOWN CLL    RBC Morphology MIXED RBC POPULATION AND HYPOCHROMASIA NOTED    Smear Review PLATELETS APPEAR ADEQUATE     Comment: PLATELETS VARY IN SIZE.   Abs  Immature Granulocytes 0.00 0.00 - 0.07 K/uL    Comment: Performed at [facility], [address] [place], [place] [address]  Type and screen     Status: None   Collection Time: 08/16/20  9:11 AM  Result Value Ref Range   ABO/RH(D) A POS    Antibody Screen NEG    Sample Expiration 08/19/2020,2359    Unit Number [id number]    Blood Component Type RBC, LR IRR    Unit division 00    Status of Unit ISSUED,FINAL    Transfusion Status OK TO TRANSFUSE    Crossmatch Result      Compatible Performed at [facility], [address] [place], [place] [address]    Unit Number [id number]    Blood Component Type RBC, LR IRR    Unit division 00    Status of Unit ISSUED,FINAL    Transfusion Status OK TO TRANSFUSE    Crossmatch Result Compatible   BPAM RBC     Status: None   Collection Time: 08/16/20  9:11 AM  Result Value Ref Range   ISSUE DATE /  TIME 048889169450    Blood Product Unit Number [id number]    PRODUCT CODE [id number]    Unit Type and Rh 9480    Blood Product Expiration Date [id number]    ISSUE DATE / TIME 867544920100    Blood Product Unit Number [id number]    PRODUCT CODE [id number]    Unit Type and Rh 3094    Blood Product Expiration Date [id number]   Prepare RBC (crossmatch)     Status: None   Collection Time: 08/16/20 10:30 AM  Result Value Ref Range   Order Confirmation      ORDER PROCESSED BY BLOOD BANK Performed at [facility], [address] [place], [place] [address]   Surgical pathology     Status: None   Collection Time: 08/17/20 12:00 AM  Result Value Ref Range   SURGICAL PATHOLOGY      SURGICAL PATHOLOGY CASE: [id number] PATIENT: Tomasa Blase Bone Marrow Report     Clinical History: CLL, right iliac, (ADC)     DIAGNOSIS:  BONE MARROW, ASPIRATE, CLOT, CORE: -Hypercellular bone marrow with involvement by chronic lymphocytic leukemia/small lymphocytic lymphoma -See comment  PERIPHERAL  BLOOD: -Normocytic-normochromic anemia -Lymphocytosis  COMMENT:  The bone marrow is involved by a low-grade B-cell lymphoproliferative process with morphologic and phenotypic features consistent with previously known chronic lymphocytic leukemia/small lymphocytic lymphoma.  As compared to previous material (FZB19- 914), the involvement is much more pronounced in the current material. Correlation with cytogenetic studies is recommended.  MICROSCOPIC DESCRIPTION:  PERIPHERAL BLOOD SMEAR: The red blood cells display prominent anisocytosis with macrocytic and normocytic cells.  There is mild poikilocytosis with elliptocytes, target cells, teardrop cells  and scattered schistocytes.  There is mild polychromasia.  The white blood cells are slightly increased in number with lymphocytosis of primarily small lymphoid cells characterized by high nuclear cytoplasmic ratio, round to slightly irregular nuclei, coarse chromatin, and inconspicuous nucleoli.  Scattered larger lymphoid cells with features of prolymphocytes are seen.  The platelets are normal in number  BONE MARROW ASPIRATE: Bone marrow particles present Erythroid precursors: Progressive maturation with scattered late precursors displaying nuclear cytoplasmic dyssynchrony or irregular/lobulated nuclei Granulocytic precursors: Orderly and progressive maturation for the most part.  Scattered mature neutrophils display mild toxic granulation Megakaryocytes: Abundant with scattered small and/or hypolobated cells Lymphocytes/plasma cells: The lymphocytes are predominant representing 67% of all cells and mostly consist of small lymphoid cells with high nuclear cytoplasmi c ratio, round to slightly irregular nuclei, dense chromatin and small to inconspicuous nucleoli.  Large plasma cell aggregates are not seen.  TOUCH PREPARATIONS: A mixture of cell types present but with relative abundance of small lymphoid cells.  CLOT AND BIOPSY:  The sections show 50 to 70% cellularity with prominent interstitial infiltrates and numerous variably sized aggregates of primarily small lymphoid cells characterized by high nuclear cytoplasmic ratio, round to slightly irregular nuclei, dense chromatin and small to inconspicuous nucleoli.  Admixed are scattered larger lymphoid cells with vesicular chromatin and prominent nucleoli.  IRON STAIN: Iron stains are performed on a bone marrow aspirate or touch imprint smear and section of clot. The controls stained appropriately.       Storage Iron: Present      Ring Sideroblasts: Absent  ADDITIONAL DATA/TESTING: The specimen was sent for cytogenetic analysis and a separate report will follow. Flow cytometric  analysis of the lymphoid population ([place]) shows a monoclonal, kappa restricted B-cell population representing 41% of all cells and displaying CD5 and CD200 co-expression.  No CD20 expression is  identified likely related to treatment.  CELL COUNT DATA:  Bone Marrow count performed on 500 cells shows: Blasts:   0%   Myeloid:  22% Promyelocytes: 1%   Erythroid:     8% Myelocytes:    6%   Lymphocytes:   67% Metamyelocytes:     0%   Plasma cells:  <1% Bands:    7% Neutrophils:   5%   M:E ratio:     2.75 Eosinophils:   3% Basophils:     0% Monocytes:     3%  Lab Data: CBC performed on 08/17/2020 shows: WBC: 11.9 k/uL Neutrophils:   32% Hgb: 8.7 g/dL  Lymphocytes:   62% HCT: 25.5 %    Monocytes:     4% MCV: 98.8 fL   Eosinophils:   1% RDW: 27.0 %    Basophils:     1% PLT: 204 k/uL    GROSS DESCRIPTION:  A: Aspirate smear  B: The specimen is received in B-plus fixative and consists of a 12.0 x 12.0 x 3.0 mm aggregate of red-brown clotted blood.  The spe cimen is entirely submitted in one cassette.  C: The specimen is received in = fixative and consists of 3 cores of tan-brown bone, ranging from 0.6 to 2.8 cm in length by 0.2 cm in diameter.  The specimen is  entirely submitted in 1 cassette following decalcification with Immunocal.  Craig Staggers 08/17/2020)   Final Diagnosis performed by Susanne Greenhouse, MD.   Electronically signed 08/18/2020 Technical and / or Professional components performed at [facility], [place] [address] [place], [place] [address].  Immunohistochemistry Technical component (if applicable) was performed at [facility]. [address] [employer], [place], [place] [address].   IMMUNOHISTOCHEMISTRY DISCLAIMER (if applicable): Some of these immunohistochemical stains may have been developed and the performance characteristics determine by [facility]. Some may not have been cleared or approved by the U.S. Food and Drug Administration. The FDA has determined that suc h clearance or approval is not necessary. This test is used for clinical purposes. It should not be regarded as investigational or for research. This laboratory is certified under the [place] (CLIA-88) as qualified to perform high complexity clinical laboratory testing.  The controls stained appropriately.   Surgical pathology     Status: None   Collection Time: 08/17/20 12:00 AM  Result Value Ref Range   SURGICAL PATHOLOGY      Surgical Pathology CASE: [id number] PATIENT: Tomasa Blase Flow Pathology Report     Clinical history: None provided     DIAGNOSIS:  -Monoclonal B cell population identified -See comment  COMMENT:  The findings are most consistent with previously known chronic lymphocytic leukemia/small lymphocytic lymphoma.  There is no CD20 expression in this material likely related to therapy.  Clinical correlation is recommended.  GATING AND PHENOTYPIC ANALYSIS:  Gated population: Flow cytometric immunophenotyping is performed using antibodies to the antigens listed in the table below. Electronic gates are placed around a cell cluster displaying  light scatter properties corresponding to: lymphocytes  Abnormal Cells in the sample: 41%  Phenotype of Abnormal Cells: CD5, CD19, CD200, Kappa                       Lymphoid Antigens       Myeloid Antigens Miscellaneous CD2  NEG  CD10 NEG  CD11b     ND   CD45 POS CD3  NEG  CD19 POS  CD11c     ND   HLA-Dr    ND C D4  NEG  CD20 NEG  CD13 ND   CD34 NEG CD5  POS  CD22 ND   CD14 ND   CD38 NEG CD7  NEG  CD79b     ND   CD15 ND   CD138     ND CD8  NEG  CD103     ND   CD16 ND   TdT  ND CD25 ND   CD200     POS  CD33 ND   CD123     ND TCRab     ND   sKappa    POS  CD64 ND   CD41 ND TCRgd     NEG  sLambda   NEG  CD117     ND   CD61 ND CD56 NEG  cKappa    ND   MPO  ND   CD71 ND CD57 ND   cLambda   ND             CD235a    ND     GROSS DESCRIPTION:  Reference Case [id number]    Final Diagnosis performed by Susanne Greenhouse, MD.   Electronically signed 08/18/2020 Technical and / or Professional components performed at [facility], [place] [address] [place], [place] [address].  The above tests were developed and their performance characteristics determined by the [facility] system for the physical and immunophenotypic characterization of cell populations. They have not been cleared by the U.S. Food and Drug administration. The  FDA has determined that such clearance or  approval is not necessary. This test is used for clinical purposes. It should not be  regarded as investigational or for research   CBC with Differential/Platelet     Status: Abnormal   Collection Time: 08/17/20  7:42 AM  Result Value Ref Range   WBC 11.9 (H) 4.0 - 10.5 K/uL   RBC 2.58 (L) 4.22 - 5.81 MIL/uL   Hemoglobin 8.7 (L) 13.0 - 17.0 g/dL    Comment: REPEATED TO VERIFY   HCT 25.5 (L) 39.0 - 52.0 %   MCV 98.8 80.0 - 100.0 fL    Comment: REPEATED TO VERIFY   MCH 33.7 26.0 - 34.0 pg   MCHC 34.1 30.0 - 36.0 g/dL   RDW 27.0 (H) 11.5 - 15.5 %   Platelets 204 150 - 400 K/uL   nRBC 0.0 0.0 - 0.2  %   Neutrophils Relative % 32 %   Neutro Abs 3.8 1.7 - 7.7 K/uL   Lymphocytes Relative 60 %   Lymphs Abs 7.1 (H) 0.7 - 4.0 K/uL   Monocytes Relative 6 %   Monocytes Absolute 0.7 0.1 - 1.0 K/uL   Eosinophils Relative 1 %   Eosinophils Absolute 0.1 0.0 - 0.5 K/uL   Basophils Relative 1 %   Basophils Absolute 0.1 0.0 - 0.1 K/uL   WBC Morphology Abnormal Lymphocytes present    RBC Morphology Mixed RBC morphology present    Smear Review Normal platelet morphology    Immature Granulocytes 0 %   Abs Immature Granulocytes 0.05 0.00 - 0.07 K/uL    Comment: Performed at [facility], [address] [place], [place] [address]  Glucose, capillary     Status: None   Collection Time: 08/17/20  7:44 AM  Result Value Ref Range   Glucose-Capillary 76 70 - 99 mg/dL    Comment: Glucose reference range applies only to samples taken  after fasting for at least 8 hours.    Radiology CT BONE MARROW BIOPSY & ASPIRATION  Result Date: 08/17/2020 INDICATION: 70 year old male with history of chronic lymphocytic leukemia and worsening anemia. EXAM: CT-GUIDED BONE MARROW BIOPSY AND ASPIRATION MEDICATIONS: None ANESTHESIA/SEDATION: Fentanyl 100 mcg IV; Versed 2 mg IV Sedation Time: 10 minutes; The patient was continuously monitored during the procedure by the interventional radiology nurse under my direct supervision. COMPLICATIONS: None immediate. PROCEDURE: Informed consent was obtained from the patient following an explanation of the procedure, risks, benefits and alternatives. The patient understands, agrees and consents for the procedure. All questions were addressed. A time out was performed prior to the initiation of the procedure. The patient was positioned prone and non-contrast localization CT was performed of the pelvis to demonstrate the iliac marrow spaces. The operative site was prepped and draped in the usual sterile fashion. Under sterile conditions and local anesthesia, a 22 gauge spinal  needle was utilized for procedural planning. Next, an 11 gauge coaxial bone biopsy needle was advanced into the right iliac marrow space. Needle position was confirmed with CT imaging. Initially, a bone marrow aspiration was performed. Next, a bone marrow biopsy was obtained with the 11 gauge outer bone marrow device. Samples were prepared with the cytotechnologist and deemed adequate. The needle was removed and superficial hemostasis was obtained with manual compression. A dressing was applied. The patient tolerated the procedure well without immediate post procedural complication. IMPRESSION: Successful CT guided right iliac bone marrow aspiration and core biopsy. Ruthann Cancer, MD [facility] Radiology Electronically Signed   By: Ruthann Cancer MD   On: 08/17/2020 11:48    Assessment/Plan Essential hypertension blood pressure control important in reducing the progression of atherosclerotic disease. On appropriate oral medications.   Type 2 diabetes mellitus with complication (HCC) blood glucose control important in reducing the progression of atherosclerotic disease. Also, involved in wound healing. On appropriate medications.  Hyperlipidemia lipid control important in reducing the progression of atherosclerotic disease. Continue statin therapy  Malignant lymphoma, small lymphocytic ([place]) Follows with oncology.  Atherosclerotic peripheral vascular disease with intermittent claudication (HCC) ABIs today are 0.67 on the right and 0.89 on the left, slightly down from last study. Claudication is stable. No limb threatening symptoms. Recheck in 6 months. No change in medications.    Leotis Pain, MD  08/22/2020 4:28 PM    This note was created with Dragon medical transcription system.  Any errors from dictation are purely unintentional

## 2020-08-22 NOTE — Assessment & Plan Note (Signed)
ABIs today are 0.67 on the right and 0.89 on the left, slightly down from last study. Claudication is stable. No limb threatening symptoms. Recheck in 6 months. No change in medications.

## 2020-08-23 ENCOUNTER — Inpatient Hospital Stay: Payer: Medicare Other

## 2020-08-24 ENCOUNTER — Inpatient Hospital Stay: Payer: Medicare Other

## 2020-08-24 ENCOUNTER — Other Ambulatory Visit: Payer: Self-pay

## 2020-08-24 ENCOUNTER — Other Ambulatory Visit: Payer: Self-pay | Admitting: *Deleted

## 2020-08-24 DIAGNOSIS — C9110 Chronic lymphocytic leukemia of B-cell type not having achieved remission: Secondary | ICD-10-CM

## 2020-08-24 DIAGNOSIS — N189 Chronic kidney disease, unspecified: Secondary | ICD-10-CM | POA: Diagnosis not present

## 2020-08-24 DIAGNOSIS — D649 Anemia, unspecified: Secondary | ICD-10-CM

## 2020-08-24 LAB — CBC WITH DIFFERENTIAL/PLATELET
Abs Immature Granulocytes: 0.02 10*3/uL (ref 0.00–0.07)
Basophils Absolute: 0.1 10*3/uL (ref 0.0–0.1)
Basophils Relative: 1 %
Eosinophils Absolute: 0 10*3/uL (ref 0.0–0.5)
Eosinophils Relative: 1 %
HCT: 23.5 % — ABNORMAL LOW (ref 39.0–52.0)
Hemoglobin: 7.5 g/dL — ABNORMAL LOW (ref 13.0–17.0)
Immature Granulocytes: 0 %
Lymphocytes Relative: 60 %
Lymphs Abs: 4.3 10*3/uL — ABNORMAL HIGH (ref 0.7–4.0)
MCH: 32.3 pg (ref 26.0–34.0)
MCHC: 31.9 g/dL (ref 30.0–36.0)
MCV: 101.3 fL — ABNORMAL HIGH (ref 80.0–100.0)
Monocytes Absolute: 0.7 10*3/uL (ref 0.1–1.0)
Monocytes Relative: 10 %
Neutro Abs: 2 10*3/uL (ref 1.7–7.7)
Neutrophils Relative %: 28 %
Platelets: 145 10*3/uL — ABNORMAL LOW (ref 150–400)
RBC: 2.32 MIL/uL — ABNORMAL LOW (ref 4.22–5.81)
RDW: 22.6 % — ABNORMAL HIGH (ref 11.5–15.5)
Smear Review: ADEQUATE
WBC: 7.2 10*3/uL (ref 4.0–10.5)
nRBC: 0 % (ref 0.0–0.2)

## 2020-08-24 LAB — PREPARE RBC (CROSSMATCH)

## 2020-08-25 ENCOUNTER — Other Ambulatory Visit: Payer: Self-pay

## 2020-08-25 ENCOUNTER — Inpatient Hospital Stay: Payer: Medicare Other

## 2020-08-25 DIAGNOSIS — D649 Anemia, unspecified: Secondary | ICD-10-CM

## 2020-08-25 DIAGNOSIS — C9110 Chronic lymphocytic leukemia of B-cell type not having achieved remission: Secondary | ICD-10-CM

## 2020-08-25 DIAGNOSIS — N189 Chronic kidney disease, unspecified: Secondary | ICD-10-CM | POA: Diagnosis not present

## 2020-08-25 MED ORDER — SODIUM CHLORIDE 0.9% IV SOLUTION
250.0000 mL | Freq: Once | INTRAVENOUS | Status: AC
  Administered 2020-08-25: 250 mL via INTRAVENOUS
  Filled 2020-08-25: qty 250

## 2020-08-25 MED ORDER — DIPHENHYDRAMINE HCL 25 MG PO CAPS
25.0000 mg | ORAL_CAPSULE | Freq: Once | ORAL | Status: AC
Start: 2020-08-25 — End: 2020-08-25
  Administered 2020-08-25: 25 mg via ORAL
  Filled 2020-08-25 (×2): qty 1

## 2020-08-25 MED ORDER — ACETAMINOPHEN 325 MG PO TABS
650.0000 mg | ORAL_TABLET | Freq: Once | ORAL | Status: AC
Start: 2020-08-25 — End: 2020-08-25
  Administered 2020-08-25: 650 mg via ORAL
  Filled 2020-08-25: qty 2

## 2020-08-25 NOTE — Patient Instructions (Signed)
[facility]    Discharge Instructions: Thank you for choosing [facility] to provide your oncology and hematology care.  If you have a lab appointment with the [facility], please go directly to the [facility] and check in at the registration area.  We strive to give you quality time with your provider. You may need to reschedule your appointment if you arrive late (15 or more minutes).  Arriving late affects you and other patients whose appointments are after yours.  Also, if you miss three or more appointments without notifying the office, you may be dismissed from the clinic at the provider's discretion.      For prescription refill requests, have your pharmacy contact our office and allow 72 hours for refills to be completed.    Today you received the following treatment: 1 unit of packed red blood cells (blood transfusion).   BELOW ARE SYMPTOMS THAT SHOULD BE REPORTED IMMEDIATELY: . *FEVER GREATER THAN 100.4 F (38 C) OR HIGHER . *CHILLS OR SWEATING . *NAUSEA AND VOMITING THAT IS NOT CONTROLLED WITH YOUR NAUSEA MEDICATION . *UNUSUAL SHORTNESS OF BREATH . *UNUSUAL BRUISING OR BLEEDING . *URINARY PROBLEMS (pain or burning when urinating, or frequent urination) . *BOWEL PROBLEMS (unusual diarrhea, constipation, pain near the anus) . TENDERNESS IN MOUTH AND THROAT WITH OR WITHOUT PRESENCE OF ULCERS (sore throat, sores in mouth, or a toothache) . UNUSUAL RASH, SWELLING OR PAIN  . UNUSUAL VAGINAL DISCHARGE OR ITCHING   Items with * indicate a potential emergency and should be followed up as soon as possible or go to the Emergency Department if any problems should occur.  Please show the CHEMOTHERAPY ALERT CARD or IMMUNOTHERAPY ALERT CARD at check-in to the Emergency Department and triage nurse.  Should you have questions after your visit or need to cancel or reschedule your appointment, please contact [facility]  [phone] and follow the prompts.  Office hours are 8:00 a.m. to 4:30 p.m. Monday - Friday. Please note that voicemails left after 4:00 p.m. may not be returned until the following business day.  We are closed weekends and major holidays. You have access to a nurse at all times for urgent questions. Please call the main number to the clinic [phone] and follow the prompts.  For any non-urgent questions, you may also contact your provider using MyChart. We now offer e-Visits for anyone 73 and older to request care online for non-urgent symptoms. For details visit [email or web address].   Also download the MyChart app! Go to the app store, search "MyChart", open the app, select [facility], and log in with your MyChart username and password.  Due to Covid, a mask is required upon entering the hospital/clinic. If you do not have a mask, one will be given to you upon arrival. For doctor visits, patients may have 1 support person aged 68 or older with them. For treatment visits, patients cannot have anyone with them due to current Covid guidelines and our immunocompromised population.   Blood Transfusion, Adult A blood transfusion is a procedure in which you receive blood or a type of blood cell (blood component) through an IV. You may need a blood transfusion when your blood level is low. This may result from a bleeding disorder, illness, injury, or surgery. The blood may come from a donor. You may also be able to donate blood for yourself (autologous blood donation) before a planned surgery. The blood given in a transfusion  is made up of different blood components. You may receive:  Red blood cells. These carry oxygen to the cells in the body.  Platelets. These help your blood to clot.  Plasma. This is the liquid part of your blood. It carries proteins and other substances throughout the body.  White blood cells. These help you fight infections. If you have hemophilia or  another clotting disorder, you may also receive other types of blood products. Tell a health care provider about:  Any blood disorders you have.  Any previous reactions you have had during a blood transfusion.  Any allergies you have.  All medicines you are taking, including vitamins, herbs, eye drops, creams, and over-the-counter medicines.  Any surgeries you have had.  Any medical conditions you have, including any recent fever or cold symptoms.  Whether you are pregnant or may be pregnant. What are the risks? Generally, this is a safe procedure. However, problems may occur.  The most common problems include: ? A mild allergic reaction, such as red, swollen areas of skin (hives) and itching. ? Fever or chills. This may be the body's response to new blood cells received. This may occur during or up to 4 hours after the transfusion.  More serious problems may include: ? Transfusion-associated circulatory overload (TACO), or too much fluid in the lungs. This may cause breathing problems. ? A serious allergic reaction, such as difficulty breathing or swelling around the face and lips. ? Transfusion-related acute lung injury (TRALI), which causes breathing difficulty and low oxygen in the blood. This can occur within hours of the transfusion or several days later. ? Iron overload. This can happen after receiving many blood transfusions over a period of time. ? Infection or virus being transmitted. This is rare because donated blood is carefully tested before it is given. ? Hemolytic transfusion reaction. This is rare. It happens when your body's defense system (immune system)tries to attack the new blood cells. Symptoms may include fever, chills, nausea, low blood pressure, and low back or chest pain. ? Transfusion-associated graft-versus-host disease (TAGVHD). This is rare. It happens when donated cells attack your body's healthy tissues. What happens before the  procedure? Medicines Ask your health care provider about:  Changing or stopping your regular medicines. This is especially important if you are taking diabetes medicines or blood thinners.  Taking medicines such as aspirin and ibuprofen. These medicines can thin your blood. Do not take these medicines unless your health care provider tells you to take them.  Taking over-the-counter medicines, vitamins, herbs, and supplements. General instructions  Follow instructions from your health care provider about eating and drinking restrictions.  You will have a blood test to determine your blood type. This is necessary to know what kind of blood your body will accept and to match it to the donor blood.  If you are going to have a planned surgery, you may be able to do an autologous blood donation. This may be done in case you need to have a transfusion.  You will have your temperature, blood pressure, and pulse monitored before the transfusion.  If you have had an allergic reaction to a transfusion in the past, you may be given medicine to help prevent a reaction. This medicine may be given to you by mouth (orally) or through an IV.  Set aside time for the blood transfusion. This procedure generally takes 1-4 hours to complete. What happens during the procedure?  An IV will be inserted into one of your  veins.  The bag of donated blood will be attached to your IV. The blood will then enter through your vein.  Your temperature, blood pressure, and pulse will be monitored regularly during the transfusion. This monitoring is done to detect early signs of a transfusion reaction.  Tell your nurse right away if you have any of these symptoms during the transfusion: ? Shortness of breath or trouble breathing. ? Chest or back pain. ? Fever or chills. ? Hives or itching.  If you have any signs or symptoms of a reaction, your transfusion will be stopped and you may be given medicine.  When the  transfusion is complete, your IV will be removed.  Pressure may be applied to the IV site for a few minutes.  A bandage (dressing)will be applied. The procedure may vary among health care providers and hospitals.   What happens after the procedure?  Your temperature, blood pressure, pulse, breathing rate, and blood oxygen level will be monitored until you leave the hospital or clinic.  Your blood may be tested to see how you are responding to the transfusion.  You may be warmed with fluids or blankets to maintain a normal body temperature.  If you receive your blood transfusion in an outpatient setting, you will be told whom to contact to report any reactions. Where to find more information For more information on blood transfusions, visit the American Red Cross: redcross.org Summary  A blood transfusion is a procedure in which you receive blood or a type of blood cell (blood component) through an IV.  The blood you receive may come from a donor or be donated by yourself (autologous blood donation) before a planned surgery.  The blood given in a transfusion is made up of different blood components. You may receive red blood cells, platelets, plasma, or white blood cells depending on the condition treated.  Your temperature, blood pressure, and pulse will be monitored before, during, and after the transfusion.  After the transfusion, your blood may be tested to see how your body has responded. This information is not intended to replace advice given to you by your health care provider. Make sure you discuss any questions you have with your health care provider. Document Revised: 02/18/2019 Document Reviewed: 10/08/2018 Elsevier Patient Education  2021 [place].   Blood Transfusion, Adult, Care After This sheet gives you information about how to care for yourself after your procedure. Your doctor may also give you more specific instructions. If you have problems or questions,  contact your doctor. What can I expect after the procedure? After the procedure, it is common to have:  Bruising and soreness at the IV site.  A fever or chills on the day of the procedure. This may be your body's response to the new blood cells received.  A headache. Follow these instructions at home: Insertion site care  Follow instructions from your doctor about how to take care of your insertion site. This is where an IV tube was put into your vein. Make sure you: ? Wash your hands with soap and water before and after you change your bandage (dressing). If you cannot use soap and water, use hand sanitizer. ? Change your bandage as told by your doctor.  Check your insertion site every day for signs of infection. Check for: ? Redness, swelling, or pain. ? Bleeding from the site. ? Warmth. ? Pus or a bad smell.      General instructions  Take over-the-counter and prescription medicines  only as told by your doctor.  Rest as told by your doctor.  Go back to your normal activities as told by your doctor.  Keep all follow-up visits as told by your doctor. This is important. Contact a doctor if:  You have itching or red, swollen areas of skin (hives).  You feel worried or nervous (anxious).  You feel weak after doing your normal activities.  You have redness, swelling, warmth, or pain around the insertion site.  You have blood coming from the insertion site, and the blood does not stop with pressure.  You have pus or a bad smell coming from the insertion site. Get help right away if:  You have signs of a serious reaction. This may be coming from an allergy or the body's defense system (immune system). Signs include: ? Trouble breathing or shortness of breath. ? Swelling of the face or feeling warm (flushed). ? Fever or chills. ? Head, chest, or back pain. ? Dark pee (urine) or blood in the pee. ? Widespread rash. ? Fast heartbeat. ? Feeling dizzy or  light-headed. You may receive your blood transfusion in an outpatient setting. If so, you will be told whom to contact to report any reactions. These symptoms may be an emergency. Do not wait to see if the symptoms will go away. Get medical help right away. Call your local emergency services (911 in the [place] Do not drive yourself to the hospital. Summary  Bruising and soreness at the IV site are common.  Check your insertion site every day for signs of infection.  Rest as told by your doctor. Go back to your normal activities as told by your doctor.  Get help right away if you have signs of a serious reaction. This information is not intended to replace advice given to you by your health care provider. Make sure you discuss any questions you have with your health care provider. Document Revised: 10/08/2018 Document Reviewed: 10/08/2018 Elsevier Patient Education  [place].

## 2020-08-25 NOTE — Progress Notes (Signed)
Nutrition Follow-up:  Patient with stage IV CLL.  Patient on gazyva.  Patient has pain pump for abdominal pain.    Met with patient during infusion.  Patient reports that appetite is decreased.  Pain pump has not helped abdominal pain.  Has not eaten anything for breakfast this am before coming to treatment.  Drinks ensure but not everyday, sometimes 1 or 2.  Gets tired of them.  Yesterday ate 2 eggs, chicken thigh and ice cream.  Patient goes out to eat or neighbour brings him food or cooks food in microwave.    Medications: reviewed  Labs: reviewed  Anthropometrics:   Weight 140 lb 4/26  138 lb on 3/31 145 lb on 11/15 162 lb on 10/6   NUTRITION DIAGNOSIS: Inadequate oral intake continues   INTERVENTION:  Patient declined needing more ensure Encouraged "nibbling" q 1-2 hours. We talked about snacks/foods to have on hand already prepared for patient.  Continue remeron for appetite.     MONITORING, EVALUATION, GOAL: weight trends, intake   NEXT VISIT: to be determined with treatment  James [name], [place], [place] Registered Dietitian [phone] (mobile)

## 2020-08-26 LAB — TYPE AND SCREEN
ABO/RH(D): A POS
Antibody Screen: NEGATIVE
Unit division: 0

## 2020-08-26 LAB — BPAM RBC
Blood Product Expiration Date: 202205122359
ISSUE DATE / TIME: 202204290943
Unit Type and Rh: 6200

## 2020-08-28 ENCOUNTER — Encounter (HOSPITAL_COMMUNITY): Payer: Self-pay | Admitting: Internal Medicine

## 2020-08-29 ENCOUNTER — Other Ambulatory Visit: Payer: Self-pay

## 2020-08-29 ENCOUNTER — Inpatient Hospital Stay: Payer: Medicare Other

## 2020-08-29 ENCOUNTER — Ambulatory Visit: Payer: Medicare Other | Attending: Pain Medicine | Admitting: Pain Medicine

## 2020-08-29 ENCOUNTER — Other Ambulatory Visit: Payer: Self-pay | Admitting: *Deleted

## 2020-08-29 ENCOUNTER — Encounter: Payer: Self-pay | Admitting: Pain Medicine

## 2020-08-29 VITALS — BP 149/77 | HR 90 | Temp 96.8°F | Resp 16 | Ht 68.0 in | Wt 140.0 lb

## 2020-08-29 DIAGNOSIS — G894 Chronic pain syndrome: Secondary | ICD-10-CM | POA: Insufficient documentation

## 2020-08-29 DIAGNOSIS — R1013 Epigastric pain: Secondary | ICD-10-CM | POA: Insufficient documentation

## 2020-08-29 DIAGNOSIS — C9110 Chronic lymphocytic leukemia of B-cell type not having achieved remission: Secondary | ICD-10-CM | POA: Diagnosis not present

## 2020-08-29 DIAGNOSIS — G893 Neoplasm related pain (acute) (chronic): Secondary | ICD-10-CM | POA: Diagnosis not present

## 2020-08-29 DIAGNOSIS — M4854XS Collapsed vertebra, not elsewhere classified, thoracic region, sequela of fracture: Secondary | ICD-10-CM | POA: Insufficient documentation

## 2020-08-29 DIAGNOSIS — Z978 Presence of other specified devices: Secondary | ICD-10-CM | POA: Diagnosis not present

## 2020-08-29 DIAGNOSIS — G8929 Other chronic pain: Secondary | ICD-10-CM

## 2020-08-29 DIAGNOSIS — C8300 Small cell B-cell lymphoma, unspecified site: Secondary | ICD-10-CM

## 2020-08-29 DIAGNOSIS — Z451 Encounter for adjustment and management of infusion pump: Secondary | ICD-10-CM | POA: Diagnosis not present

## 2020-08-29 DIAGNOSIS — Z95828 Presence of other vascular implants and grafts: Secondary | ICD-10-CM | POA: Insufficient documentation

## 2020-08-29 DIAGNOSIS — R109 Unspecified abdominal pain: Secondary | ICD-10-CM

## 2020-08-29 DIAGNOSIS — Z79899 Other long term (current) drug therapy: Secondary | ICD-10-CM | POA: Diagnosis not present

## 2020-08-29 DIAGNOSIS — C8590 Non-Hodgkin lymphoma, unspecified, unspecified site: Secondary | ICD-10-CM | POA: Diagnosis not present

## 2020-08-29 NOTE — Progress Notes (Signed)
Nursing Pain Medication Assessment:  Safety precautions to be maintained throughout the outpatient stay will include: orient to surroundings, keep bed in low position, maintain call bell within reach at all times, provide assistance with transfer out of bed and ambulation.  Medication Inspection Compliance: Pill count conducted under aseptic conditions, in front of the patient. Neither the pills nor the bottle was removed from the patient's sight at any time. Once count was completed pills were immediately returned to the patient in their original bottle.  Medication: See above Pill/Patch Count: 60 of 180 pills remain Pill/Patch Appearance: Markings consistent with prescribed medication Bottle Appearance: Standard pharmacy container. Clearly labeled. Filled Date: 04 / 14 / 2022 Last Medication intake:  Today

## 2020-08-29 NOTE — Progress Notes (Signed)
PROVIDER NOTE: Information contained herein reflects review and annotations entered in association with encounter. Interpretation of such information and data should be left to medically-trained personnel. Information provided to patient can be located elsewhere in the medical record under "Patient Instructions". Document created using STT-dictation technology, any transcriptional errors that may result from process are unintentional.    Patient: James [name]  Service Category: Procedure  Provider: Gaspar Cola, MD  DOB: July 20, 1950  DOS: 08/29/2020  Location: [facility]  MRN: [id number]  Setting: Ambulatory - outpatient  Referring Provider: [name], MD  Type: Established Patient  Specialty: Interventional Pain Management  PCP: [name], MD   Primary Reason for Visit: Interventional Pain Management Treatment. CC: Abdominal Pain  Procedure:          Intrathecal Drug Delivery System (IDDS):  Type: Reservoir Refill [id number]) No rate change Region: Abdominal Laterality: Left  Type of Pump: Medtronic Synchromed II Delivery Route: Intrathecal Type of Pain Treated: Neuropathic/Nociceptive Primary Medication Class: Opioid/opiate  Medication, Concentration, Infusion Program, & Delivery Rate: Please see scanned programming printout.   Indications: 1. Chronic pain syndrome   2. Chronic epigastric pain (1ry area of Pain)   3. Chronic abdominal pain   4. Cancer-related pain   5. CLL (chronic lymphocytic leukemia) ([place])   6. Malignant lymphoma, small lymphocytic (HCC)   7. Abdominal wall pain in epigastric region   8. Non-traumatic compression fracture of T2 thoracic vertebra, sequela   9. Non-traumatic compression fracture of T1 thoracic vertebra, sequela   10. Presence of implanted infusion pump (Medtronic)   11. Presence of intrathecal pump (Medtronic)   12. Pharmacologic therapy   13. Encounter for adjustment and management of infusion pump    Pain  Assessment: Self-Reported Pain Score: 5 /10             Reported level is compatible with observation.        Pharmacotherapy Assessment  Analgesic: Oxycodone IR 23m, 1 tab PO q 4 hrs (60 mg/day of oxycodone IR) (90 MME) Highest recorded MME/day: 150 mg/day 90 day average MME/day: 632.27 mg/day, according to PMP   Monitoring: [place] PMP: PDMP reviewed during this encounter.       Pharmacotherapy: No side-effects or adverse reactions reported. Compliance: No problems identified. Effectiveness: Clinically acceptable. Plan: Refer to "POC".  UDS: No results found for: SUMMARY  Intrathecal Pump Therapy Assessment  Manufacturer: Medtronic Synchromed Type: Programmable Volume: 40 mL reservoir MRI compatibility: Yes   Drug content:  Primary Medication Class:Opioid Primary Medication:PF-Fentanyl(2000 mcg/mL) Secondary Medication:PF-Bupivacaine(20 mg/mL) Other Medication:None  PTM(PCA) mode: PTM dose:25 mcg bolus Duration of bolus:1 minute Lockout interval:4hours Maximum 24-hour doses:6   Programming:  Type: Complex with PCA. See pump readout for details.   Changes:  Medication Change: None at this point Rate Change: No change in rate  Reported side-effects or adverse reactions: None reported  Effectiveness: Described as relatively effective, allowing for increase in activities of daily living (ADL) Clinically meaningful improvement in function (CMIF): Sustained CMIF goals met  Plan: Pump refill today  Pre-op H&P Assessment:  James [name] a 70y.o. (year old), male patient, seen today for interventional treatment. He  has a past surgical history that includes Cholecystectomy (1983); Esophagogastroduodenoscopy (egd) with propofol (N/A, 11/20/2016); Cataract extraction w/ intraocular lens implant (Bilateral); Lower Extremity Angiography (Right, 05/19/2017); Esophagogastroduodenoscopy (egd) with propofol (N/A, 10/27/2017); Colonoscopy with propofol (N/A, 10/27/2017);  Lower Extremity Angiography (Left, 08/10/2018); and Intrathecal pump implant (Left, 02/07/2020). James [name] a current medication  list which includes the following prescription(s): AMBULATORY NON FORMULARY MEDICATION, atorvastatin, clopidogrel, ensure, folic acid, gabapentin, metformin, methocarbamol, mirtazapine, naloxegol oxalate, naloxone, oxycodone hcl, [START ON 09/09/2020] oxycodone hcl, and pantoprazole. His primarily concern today is the Abdominal Pain  Initial Vital Signs:  Pulse/HCG Rate: 90  Temp: (!) 96.8 F (36 C) Resp: 16 BP: (!) 149/77 SpO2: 100 %  BMI: Estimated body mass index is 21.29 kg/m as calculated from the following:   Height as of this encounter: 5' 8" (1.727 m).   Weight as of this encounter: 140 lb (63.5 kg).  Risk Assessment: Allergies: Reviewed. He has No Known Allergies.  Allergy Precautions: None required Coagulopathies: Reviewed. None identified.  Blood-thinner therapy: None at this time Active Infection(s): Reviewed. None identified. James [name] is afebrile  Site Confirmation: James [name] was asked to confirm the procedure and laterality before marking the site Procedure checklist: Completed Consent: Before the procedure and under the influence of no sedative(s), amnesic(s), or anxiolytics, the patient was informed of the treatment options, risks and possible complications. To fulfill our ethical and legal obligations, as recommended by the American Medical Association's Code of Ethics, I have informed the patient of my clinical impression; the nature and purpose of the treatment or procedure; the risks, benefits, and possible complications of the intervention; the alternatives, including doing nothing; the risk(s) and benefit(s) of the alternative treatment(s) or procedure(s); and the risk(s) and benefit(s) of doing nothing.  James [name] was provided with information about the general risks and possible complications associated with most interventional  procedures. These include, but are not limited to: failure to achieve desired goals, infection, bleeding, organ or nerve damage, allergic reactions, paralysis, and/or death.  In addition, he was informed of those risks and possible complications associated to this particular procedure, which include, but are not limited to: damage to the implant; failure to decrease pain; local, systemic, or serious CNS infections, intraspinal abscess with possible cord compression and paralysis, or life-threatening such as meningitis; bleeding; organ damage; nerve injury or damage with subsequent sensory, motor, and/or autonomic system dysfunction, resulting in transient or permanent pain, numbness, and/or weakness of one or several areas of the body; allergic reactions, either minor or major life-threatening, such as anaphylactic or anaphylactoid reactions.  Furthermore, James [name] was informed of those risks and complications associated with the medications. These include, but are not limited to: allergic reactions (i.e.: anaphylactic or anaphylactoid reactions); endorphine suppression; bradycardia and/or hypotension; water retention and/or peripheral vascular relaxation leading to lower extremity edema and possible stasis ulcers; respiratory depression and/or shortness of breath; decreased metabolic rate leading to weight gain; swelling or edema; medication-induced neural toxicity; particulate matter embolism and blood vessel occlusion with resultant organ, and/or nervous system infarction; and/or intrathecal granuloma formation with possible spinal cord compression and permanent paralysis.  Before refilling the pump James [name] was informed that some of the medications used in the devise may not be FDA approved for such use and therefore it constitutes an off-label use of the medications.  Finally, he was informed that Medicine is not an exact science; therefore, there is also the possibility of unforeseen or unpredictable  risks and/or possible complications that may result in a catastrophic outcome. The patient indicated having understood very clearly. We have given the patient no guarantees and we have made no promises. Enough time was given to the patient to ask questions, all of which were answered to the patient's satisfaction. James [name] has indicated that he wanted to continue with the procedure.  Attestation: I, the ordering provider, attest that I have discussed with the patient the benefits, risks, side-effects, alternatives, likelihood of achieving goals, and potential problems during recovery for the procedure that I have provided informed consent. Date  Time: 08/29/2020 11:16 AM  Pre-Procedure Preparation:  Monitoring: As per clinic protocol. Respiration, ETCO2, SpO2, BP, heart rate and rhythm monitor placed and checked for adequate function Safety Precautions: Patient was assessed for positional comfort and pressure points before starting the procedure. Time-out: I initiated and conducted the "Time-out" before starting the procedure, as per protocol. The patient was asked to participate by confirming the accuracy of the "Time Out" information. Verification of the correct person, site, and procedure were performed and confirmed by me, the nursing staff, and the patient. "Time-out" conducted as per Joint Commission's Universal Protocol (UP.01.01.01). Time: 1120  Description of Procedure:          Position: Supine Target Area: Central-port of intrathecal pump. Approach: Anterior, 90 degree angle approach. Area Prepped: Entire Area around the pump implant. DuraPrep (Iodine Povacrylex [0.7% available iodine] and Isopropyl Alcohol, 74% w/w) Safety Precautions: Aspiration looking for blood return was conducted prior to all injections. At no point did we inject any substances, as a needle was being advanced. No attempts were made at seeking any paresthesias. Safe injection practices and needle disposal techniques  used. Medications properly checked for expiration dates. SDV (single dose vial) medications used. Description of the Procedure: Protocol guidelines were followed. Two nurses trained to do implant refills were present during the entire procedure. The refill medication was checked by both healthcare providers as well as the patient. The patient was included in the "Time-out" to verify the medication. The patient was placed in position. The pump was identified. The area was prepped in the usual manner. The sterile template was positioned over the pump, making sure the side-port location matched that of the pump. Both, the pump and the template were held for stability. The needle provided in the Medtronic Kit was then introduced thru the center of the template and into the central port. The pump content was aspirated and discarded volume documented. The new medication was slowly infused into the pump, thru the filter, making sure to avoid overpressure of the device. The needle was then removed and the area cleansed, making sure to leave some of the prepping solution back to take advantage of its long term bactericidal properties. The pump was interrogated and programmed to reflect the correct medication, volume, and dosage. The program was printed and taken to the physician for approval. Once checked and signed by the physician, a copy was provided to the patient and another scanned into the EMR. Vitals:   08/29/20 1115  BP: (!) 149/77  Pulse: 90  Resp: 16  Temp: (!) 96.8 F (36 C)  TempSrc: Temporal  SpO2: 100%  Weight: 140 lb (63.5 kg)  Height: 5' 8" (1.727 m)    Start Time: 1120 hrs. End Time: 1134 hrs. Materials & Medications: Medtronic Refill Kit Medication(s): Please see chart orders for details.  Imaging Guidance:          Type of Imaging Technique: None used Indication(s): N/A Exposure Time: No patient exposure Contrast: None used. Fluoroscopic Guidance: N/A Ultrasound Guidance:  N/A Interpretation: N/A  Antibiotic Prophylaxis:   Anti-infectives (From admission, onward)   None     Indication(s): None identified  Post-operative Assessment:  Post-procedure Vital Signs:  Pulse/HCG Rate: 90  Temp: (!) 96.8 F (36 C) Resp: 16 BP: Marland Kitchen)  149/77 SpO2: 100 %  EBL: None  Complications: No immediate post-treatment complications observed by team, or reported by patient.  Note: The patient tolerated the entire procedure well. A repeat set of vitals were taken after the procedure and the patient was kept under observation following institutional policy, for this type of procedure. Post-procedural neurological assessment was performed, showing return to baseline, prior to discharge. The patient was provided with post-procedure discharge instructions, including a section on how to identify potential problems. Should any problems arise concerning this procedure, the patient was given instructions to immediately contact us, at any time, without hesitation. In any case, we plan to contact the patient by telephone for a follow-up status report regarding this interventional procedure.  Comments:  No additional relevant information.  Plan of Care  Orders:  Orders Placed This Encounter  Procedures  . PUMP REFILL    Maintain Protocol by having two(2) healthcare providers during procedure and programming.    Scheduling Instructions:     Please refill intrathecal pump today.    Order Specific Question:   Where will this procedure be performed?    Answer:   [facility]  . PUMP REFILL    Whenever possible schedule on a procedure today.    Standing Status:   Future    Standing Expiration Date:   01/26/2021    Scheduling Instructions:     Please schedule intrathecal pump refill based on pump programming. Avoid schedule intervals of more than 120 days (4 months).    Order Specific Question:   Where will this procedure be performed?    Answer:   [facility]  .  Informed Consent Details: Physician/Practitioner Attestation; Transcribe to consent form and obtain patient signature    Transcribe to consent form and obtain patient signature.    Order Specific Question:   Physician/Practitioner attestation of informed consent for procedure/surgical case    Answer:   I, the physician/practitioner, attest that I have discussed with the patient the benefits, risks, side effects, alternatives, likelihood of achieving goals and potential problems during recovery for the procedure that I have provided informed consent.    Order Specific Question:   Procedure    Answer:   Intrathecal pump refill    Order Specific Question:   Physician/Practitioner performing the procedure    Answer:   Attending Physician: Kathlen Brunswick. Dossie Arbour, MD & designated trained staff    Order Specific Question:   Indication/Reason    Answer:   Chronic Pain Syndrome (G89.4), presence of an intrathecal pump (Z97.8)   Chronic Opioid Analgesic:  Oxycodone IR 2m, 1 tab PO q 4 hrs (60 mg/day of oxycodone IR) (90 MME) Highest recorded MME/day: 150 mg/day 90 day average MME/day: 632.27 mg/day, according to PMP   Medications ordered for procedure: No orders of the defined types were placed in this encounter.  Medications administered: James [name] had no medications administered during this visit.  See the medical record for exact dosing, route, and time of administration.  Follow-up plan:   Return for Pump Refill (Max:372mo       Interventional Therapies  Risk  Complexity Considerations:   NOTE: PLAVIX ANTICOAGULATION  (Stop: 7 days  Restart: 2 hrs)   Planned  Pending:   Therapeutic bilateral splanchnic nerves neurolysis #1 with alcohol  Diagnostic intrathecal catheter test under fluoroscopic guidance to determine location    Under consideration:   Possible revision of intrathecal catheter    Completed:   Diagnostic bilateral celiac plexus block  x2 (06/01/2019; 11/02/2019)  (50/30/30/0) (75/75/0/0)  Therapeutic bilateral celiac plexus neurolysis with phenol x1 (04/06/2020)  Diagnostic bilateral rectus abdominis trigger point injection x1 (04/06/2020) (0/0/0)  Diagnostic (ML) T12-L1 intrathecal injection of PF-fentanyl 25 mcg as an intrathecal pump trial x1 (12/14/2019)   Therapeutic  Palliative (PRN) options:   Intrathecal pump management      Recent Visits Date Type Provider Dept  07/27/20 Procedure visit Milinda Pointer, MD [facility]  06/27/20 Procedure visit Milinda Pointer, MD [facility]  06/01/20 Procedure visit Milinda Pointer, MD [facility]  Showing recent visits within past 90 days and meeting all other requirements Today's Visits Date Type Provider Dept  08/29/20 Procedure visit Milinda Pointer, MD [facility]  Showing today's visits and meeting all other requirements Future Appointments Date Type Provider Dept  09/28/20 Appointment Milinda Pointer, MD [facility]  Showing future appointments within next 90 days and meeting all other requirements  Disposition: Discharge home  Discharge (Date  Time): 08/29/2020; 1143 hrs.   Primary Care Physician: [name], MD Location: [facility] Note by: Gaspar Cola, MD Date: 08/29/2020; Time: 11:57 AM  Disclaimer:  Medicine is not an Chief Strategy Officer. The only guarantee in medicine is that nothing is guaranteed. It is important to note that the decision to proceed with this intervention was based on the information collected from the patient. The Data and conclusions were drawn from the patient's questionnaire, the interview, and the physical examination. Because the information was provided in large part by the patient, it cannot be guaranteed that it has not been purposely or unconsciously manipulated. Every effort has been made to obtain as much relevant data as possible for this evaluation. It is  important to note that the conclusions that lead to this procedure are derived in large part from the available data. Always take into account that the treatment will also be dependent on availability of resources and existing treatment guidelines, considered by other Pain Management Practitioners as being common knowledge and practice, at the time of the intervention. For Medico-Legal purposes, it is also important to point out that variation in procedural techniques and pharmacological choices are the acceptable norm. The indications, contraindications, technique, and results of the above procedure should only be interpreted and judged by a Board-Certified Interventional Pain Specialist with extensive familiarity and expertise in the same exact procedure and technique.

## 2020-08-29 NOTE — Patient Instructions (Signed)
Naloxone nasal spray What is this medicine? NALOXONE (nal OX one) is a narcotic blocker. It is used to treat narcotic drug overdose. This medicine may be used for other purposes; ask your health care provider or pharmacist if you have questions. COMMON BRAND NAME(S): Kloxxado, Narcan What should I tell my health care provider before I take this medicine? They need to know if you have any of these conditions:  drug abuse or addiction  heart disease  an unusual or allergic reaction to naloxone, other medicines, foods, dyes, or preservatives  pregnant or trying to get pregnant  breast-feeding How should I use this medicine? This medicine is for use in the nose. Lay the person on their back. Support their neck with your hand and allow the head to tilt back before giving the medicine. The nasal spray should be given into 1 nostril. After giving the medicine, move the person onto their side. Do not remove or test the nasal spray until ready to use. Get emergency medical help right away after giving the first dose of this medicine, even if the person wakes up. You should be familiar with how to recognize the signs and symptoms of a narcotic overdose. If more doses are needed, give the additional dose in the other nostril. Talk to your pediatrician regarding the use of this medicine in children. While this drug may be prescribed for children as young as newborns for selected conditions, precautions do apply. Overdosage: If you think you have taken too much of this medicine contact a poison control center or emergency room at once. NOTE: This medicine is only for you. Do not share this medicine with others. What if I miss a dose? This does not apply. What may interact with this medicine? This medicine is only used during an emergency. No interactions are expected during emergency use. This list may not describe all possible interactions. Give your health care provider a list of all the medicines,  herbs, non-prescription drugs, or dietary supplements you use. Also tell them if you smoke, drink alcohol, or use illegal drugs. Some items may interact with your medicine. What should I watch for while using this medicine? Keep this medicine ready for use in the case of a narcotic overdose. Make sure that you have the phone number of your doctor or health care professional and local hospital ready. You may need to have additional doses of this medicine. Each nasal spray contains a single dose. Some emergencies may require additional doses. After use, bring the treated person to the nearest hospital or call 911. Make sure the treating health care professional knows that the person has received an injection of this medicine. You will receive additional instructions on what to do during and after use of this medicine before an emergency occurs. What side effects may I notice from receiving this medicine? Side effects that you should report to your doctor or health care professional as soon as possible:  allergic reactions like skin rash, itching or hives, swelling of the face, lips, or tongue  breathing problems  fast, irregular heartbeat  high blood pressure  pain that was controlled by narcotic pain medicine  seizures Side effects that usually do not require medical attention (report to your doctor or health care professional if they continue or are bothersome):  anxious  chills  diarrhea  fever  headache  muscle pain  nausea, vomiting  nose irritation like dryness, congestion, and swelling  sweating This list may not describe all possible side  effects. Call your doctor for medical advice about side effects. You may report side effects to FDA at 1-800-FDA-1088. Where should I keep my medicine? Keep out of the reach of children and pets. Store between 20 and 25 degrees C (68 and 77 degrees F). Do not freeze. Throw away any unused medicine after the expiration date. Keep in  original box until ready to use. NOTE: This sheet is a summary. It may not cover all possible information. If you have questions about this medicine, talk to your doctor, pharmacist, or health care provider.  2021 Elsevier/Gold Standard (2019-08-31 12:08:55) Naloxone injection What is this medicine? NALOXONE (nal OX one) is a narcotic blocker. It is used to treat narcotic (opioid) drug overdose. It is used to temporarily reverse the effects of opioid medicines. This medicine has no effect in people who are not taking opioid medicines. This medicine may be used for other purposes; ask your health care provider or pharmacist if you have questions. COMMON BRAND NAME(S): EVZIO, Narcan What should I tell my health care provider before I take this medicine? They need to know if you have any of these conditions:  drug abuse or addiction  heart disease  an unusual or allergic reaction to naloxone, other medicines, foods, dyes, or preservatives  pregnant or trying to get pregnantbreast-feeding How should I use this medicine? This medicine may be administered in a hospital, clinic, or can be used by the public to give aid to a person who has overdosed until emergency medical help is available. This medicine is for injection into the outer thigh. It can be injected through clothing if needed. Get emergency medical help right away after giving the first dose of this medicine, even if the person wakes up. You should be familiar with how to recognize the signs and symptoms of a narcotic overdose. Administer according to the printed instructions on the device label or the electronic voice instructions. You should practice using the Trainer injector before this medicine is needed. Talk to your pediatrician regarding the use of this medicine in children. While this drug may be prescribed for children as young as newborn for selected conditions, precautions do apply. For infants less than 1 year of age, pinch the  thigh muscle while administering. Overdosage: If you think you have taken too much of this medicine contact a poison control center or emergency room at once. NOTE: This medicine is only for you. Do not share this medicine with others. What if I miss a dose? This does not apply. What may interact with this medicine? This medicine is only used during an emergency. No interactions are expected during emergency use. This list may not describe all possible interactions. Give your health care provider a list of all the medicines, herbs, non-prescription drugs, or dietary supplements you use. Also tell them if you smoke, drink alcohol, or use illegal drugs. Some items may interact with your medicine. What should I watch for while using this medicine? Keep this medicine ready for use in the case of a narcotic overdose. Make sure that you have the phone number of your doctor or health care professional and local hospital ready. You may need to have additional doses of this medicine. Each injector contains a single dose. Some emergencies may require additional doses. After use, bring the treated person to the nearest hospital or call 911. Make sure the treating health care professional knows that the person has received an injection of this medicine. You will receive additional instructions  on what to do during and after use of this medicine before an emergency occurs. What side effects may I notice from receiving this medicine? Side effects that you should report to your doctor or health care professional as soon as possible:  allergic reactions like skin rash, itching or hives, swelling of the face, lips, or tongue  breathing problems  fast, irregular heartbeat  high blood pressure  pain that was controlled by narcotic pain medicine  seizures Side effects that usually do not require medical attention (report to your doctor or health care professional if they continue or are  bothersome):  anxious  chills  diarrhea  fever  nausea, vomiting  sweating This list may not describe all possible side effects. Call your doctor for medical advice about side effects. You may report side effects to FDA at 1-800-FDA-1088. Where should I keep my medicine? Keep out of the reach of children. Store at room temperature between 15 and 25 degrees C (59 and 77 degrees F). If you are using this medicine at home, you will be instructed on how to store this medicine. Keep this medicine in its outer case until ready to use. Occasionally check the solution through the viewing window of the injector. The solution should be clear. If it is discolored, cloudy, or contains solid particles, replace it with a new injector. Remember to check the expiration date of this medicine regularly. Throw away any unused medicine after the expiration date. NOTE: This sheet is a summary. It may not cover all possible information. If you have questions about this medicine, talk to your doctor, pharmacist, or health care provider.  2021 Elsevier/Gold Standard (2015-04-25 16:45:38) Opioid Overdose Opioids are drugs that are often used to treat pain. Opioids include illegal drugs, such as heroin, as well as prescription pain medicines, such as codeine, morphine, hydrocodone, oxycodone, and fentanyl. An opioid overdose happens when you take too much of an opioid. An overdose may be intentional or accidental and can happen with any type of opioid. The effects of an overdose can be mild, dangerous, or even deadly. Opioid overdose is a medical emergency. What are the causes? This condition may be caused by:  Taking too much of an opioid on purpose.  Taking too much of an opioid by accident.  Using two or more substances that contain opioids at the same time.  Taking an opioid with a substance that affects your heart, breathing, or blood pressure. These include alcohol, tranquilizers, sleeping pills, illegal  drugs, and some over-the-counter medicines. This condition may also happen due to an error made by:  A health care provider who prescribes a medicine.  The pharmacist who fills the prescription order. What increases the risk? This condition is more likely in:  Children. They may be attracted to colorful pills. Because of a child's small size, even a small amount of a drug can be dangerous.  Older people. They may be taking many different drugs. Older people may have difficulty reading labels or remembering when they last took their medicine. They may also be more sensitive to the effects of opioids.  People with chronic medical conditions, especially heart, liver, kidney, or neurological diseases.  People who take an opioid for a long period of time.  People who use: ? Illegal drugs. IV heroin is especially dangerous. ? Other substances, including alcohol, while using an opioid.  People who have: ? A history of drug or alcohol abuse. ? Certain mental health conditions. ? A history of previous  drug overdoses.  People who take opioids that are not prescribed for them. What are the signs or symptoms? Symptoms of this condition depend on the type of opioid and the amount that was taken. Common symptoms include:  Sleepiness or difficulty waking from sleep.  Decrease in attention.  Confusion.  Slurred speech.  Slowed breathing and a slow pulse (bradycardia).  Nausea and vomiting.  Abnormally small pupils. Signs and symptoms that require emergency treatment include:  Cold, clammy, and pale skin.  Blue lips and fingernails.  Vomiting.  Gurgling sounds in the throat.  A pulse that is very slow or difficult to detect.  Breathing that is very irregular, slow, noisy, or difficult to detect.  Limp body.  Inability to respond to speech or be awakened from sleep (stupor).  Seizures. How is this diagnosed? This condition is diagnosed based on your symptoms and medical  history. It is important to tell your health care provider:  About all of the opioids that you took.  When you took the opioids.  Whether you were drinking alcohol or using marijuana, cocaine, or other drugs. Your health care provider will do a physical exam. This exam may include:  Checking and monitoring your heart rate and rhythm, breathing rate, temperature, and blood pressure (vital signs).  Measuring oxygen levels in your blood.  Checking for abnormally small pupils. You may also have blood tests or urine tests. You may have X-rays if you are having severe breathing problems. How is this treated? This condition requires immediate medical treatment and hospitalization. Treatment is given in the hospital intensive care (ICU) setting. Supporting your vital signs and your breathing is the first step in treating an opioid overdose. Treatment may also include:  Giving salts and minerals (electrolytes) along with fluids through an IV.  Inserting a breathing tube (endotracheal tube) in your airway to help you breathe if you cannot breathe on your own or you are in danger of not being able to breathe on your own.  Giving oxygen through a small tube under your nose.  Passing a tube through your nose and into your stomach (nasogastric tube, or NG tube) to empty your stomach.  Giving medicines that: ? Increase your blood pressure. ? Relieve nausea and vomiting. ? Relieve abdominal pain and cramping. ? Reverse the effects of the opioid (naloxone).  Monitoring your heart and oxygen levels.  Ongoing counseling and mental health support if you intentionally overdosed or used an illegal drug. Follow these instructions at home: Medicines  Take over-the-counter and prescription medicines only as told by your health care provider.  Always ask your health care provider about possible side effects and interactions of any new medicine that you start taking.  Keep a list of all the medicines  that you take, including over-the-counter medicines. Bring this list with you to all your medical visits. General instructions  Drink enough fluid to keep your urine pale yellow.  Keep all follow-up visits as told by your health care provider. This is important.   How is this prevented?  Read the drug inserts that come with your opioid pain medicines.  Take medicines only as told by your health care provider. Do not take more medicine than you are told. Do not take medicines more frequently than you are told.  Do not drink alcohol or take sedatives when taking opioids.  Do not use illegal or recreational drugs, including cocaine, ecstasy, and marijuana.  Do not take opioid medicines that are not prescribed for you.  Store all medicines in safety containers that are out of the reach of children.  Get help if you are struggling with: ? Alcohol or drug use. ? Depression or another mental health problem. ? Thoughts of hurting yourself or another person.  Keep the phone number of your local poison control center near your phone or in your mobile phone. In the [place] the hotline of the [facility] is (670) 779-8987.  If you were prescribed naloxone, make sure you understand how to take it. Contact a health care provider if you:  Need help understanding how to take your pain medicines.  Feel your medicines are too strong.  Are concerned that your pain medicines are not working well for your pain.  Develop new symptoms or side effects when you are taking medicines. Get help right away if:  You or someone else is having symptoms of an opioid overdose. Get help even if you are not sure.  You have serious thoughts about hurting yourself or others.  You have: ? Chest pain. ? Difficulty breathing. ? A loss of consciousness. These symptoms may represent a serious problem that is an emergency. Do not wait to see if the symptoms will go away. Get medical help right  away. Call your local emergency services (911 in the [place] Do not drive yourself to the hospital. If you ever feel like you may hurt yourself or others, or have thoughts about taking your own life, get help right away. You can go to your nearest emergency department or call:  Your local emergency services (911 in the [place]  A suicide crisis helpline, such as the [facility] at 978-103-5405. This is open 24 hours a day. Summary  Opioids are drugs that are often used to treat pain. Opioids include illegal drugs, such as heroin, as well as prescription pain medicines.  An opioid overdose happens when you take too much of an opioid.  Overdoses can be intentional or accidental.  Opioid overdose is very dangerous. It is a life-threatening emergency.  If you or someone you know is experiencing an opioid overdose, get help right away. This information is not intended to replace advice given to you by your health care provider. Make sure you discuss any questions you have with your health care provider. Document Revised: 04/02/2018 Document Reviewed: 04/02/2018 Elsevier Patient Education  2021 [employer].

## 2020-08-30 ENCOUNTER — Telehealth: Payer: Self-pay | Admitting: Pharmacy Technician

## 2020-08-30 ENCOUNTER — Inpatient Hospital Stay: Payer: Medicare Other

## 2020-08-30 ENCOUNTER — Telehealth: Payer: Self-pay | Admitting: Pharmacist

## 2020-08-30 ENCOUNTER — Other Ambulatory Visit: Payer: Self-pay

## 2020-08-30 ENCOUNTER — Encounter: Payer: Self-pay | Admitting: Internal Medicine

## 2020-08-30 ENCOUNTER — Other Ambulatory Visit (HOSPITAL_COMMUNITY): Payer: Self-pay

## 2020-08-30 ENCOUNTER — Inpatient Hospital Stay (HOSPITAL_BASED_OUTPATIENT_CLINIC_OR_DEPARTMENT_OTHER): Payer: Medicare Other | Admitting: Internal Medicine

## 2020-08-30 ENCOUNTER — Inpatient Hospital Stay: Payer: Medicare Other | Attending: Internal Medicine

## 2020-08-30 ENCOUNTER — Telehealth: Payer: Self-pay

## 2020-08-30 DIAGNOSIS — I70219 Atherosclerosis of native arteries of extremities with intermittent claudication, unspecified extremity: Secondary | ICD-10-CM

## 2020-08-30 DIAGNOSIS — N189 Chronic kidney disease, unspecified: Secondary | ICD-10-CM | POA: Diagnosis not present

## 2020-08-30 DIAGNOSIS — C9110 Chronic lymphocytic leukemia of B-cell type not having achieved remission: Secondary | ICD-10-CM | POA: Diagnosis not present

## 2020-08-30 DIAGNOSIS — D631 Anemia in chronic kidney disease: Secondary | ICD-10-CM | POA: Diagnosis not present

## 2020-08-30 DIAGNOSIS — C9112 Chronic lymphocytic leukemia of B-cell type in relapse: Secondary | ICD-10-CM | POA: Insufficient documentation

## 2020-08-30 LAB — BASIC METABOLIC PANEL
Anion gap: 13 (ref 5–15)
BUN: 24 mg/dL — ABNORMAL HIGH (ref 8–23)
CO2: 26 mmol/L (ref 22–32)
Calcium: 9 mg/dL (ref 8.9–10.3)
Chloride: 107 mmol/L (ref 98–111)
Creatinine, Ser: 1.31 mg/dL — ABNORMAL HIGH (ref 0.61–1.24)
GFR, Estimated: 58 mL/min — ABNORMAL LOW (ref >60)
Glucose, Bld: 93 mg/dL (ref 70–99)
Potassium: 4.3 mmol/L (ref 3.5–5.1)
Sodium: 146 mmol/L — ABNORMAL HIGH (ref 135–145)

## 2020-08-30 LAB — CBC WITH DIFFERENTIAL/PLATELET
Abs Immature Granulocytes: 0.02 10*3/uL (ref 0.00–0.07)
Basophils Absolute: 0.1 10*3/uL (ref 0.0–0.1)
Basophils Relative: 2 %
Eosinophils Absolute: 0.1 10*3/uL (ref 0.0–0.5)
Eosinophils Relative: 2 %
HCT: 24.4 % — ABNORMAL LOW (ref 39.0–52.0)
Hemoglobin: 7.8 g/dL — ABNORMAL LOW (ref 13.0–17.0)
Immature Granulocytes: 0 %
Lymphocytes Relative: 61 %
Lymphs Abs: 3.6 10*3/uL (ref 0.7–4.0)
MCH: 32.5 pg (ref 26.0–34.0)
MCHC: 32 g/dL (ref 30.0–36.0)
MCV: 101.7 fL — ABNORMAL HIGH (ref 80.0–100.0)
Monocytes Absolute: 0.6 10*3/uL (ref 0.1–1.0)
Monocytes Relative: 10 %
Neutro Abs: 1.4 10*3/uL — ABNORMAL LOW (ref 1.7–7.7)
Neutrophils Relative %: 25 %
Platelets: 131 10*3/uL — ABNORMAL LOW (ref 150–400)
RBC: 2.4 MIL/uL — ABNORMAL LOW (ref 4.22–5.81)
RDW: 21.4 % — ABNORMAL HIGH (ref 11.5–15.5)
WBC: 5.8 10*3/uL (ref 4.0–10.5)
nRBC: 0 % (ref 0.0–0.2)

## 2020-08-30 LAB — SAMPLE TO BLOOD BANK

## 2020-08-30 MED ORDER — DARBEPOETIN ALFA 300 MCG/0.6ML IJ SOSY
300.0000 ug | PREFILLED_SYRINGE | Freq: Once | INTRAMUSCULAR | Status: AC
  Administered 2020-08-30: 300 ug via SUBCUTANEOUS
  Filled 2020-08-30: qty 0.6

## 2020-08-30 MED ORDER — IBRUTINIB 420 MG PO TABS
420.0000 mg | ORAL_TABLET | Freq: Every day | ORAL | 4 refills | Status: DC
  Filled 2020-08-30: qty 56, fill #0

## 2020-08-30 NOTE — Telephone Encounter (Signed)
Oral Oncology Patient Advocate Encounter  Prior Authorization for James [name] has been approved.    PA# [id number] Effective dates: 08/30/20 through 08/30/21  Patients co-pay is $3097.36.  Oral Oncology Clinic will continue to follow.   [place] Patient [place] Phone [phone] Fax [phone] 08/30/2020 11:22 AM

## 2020-08-30 NOTE — Telephone Encounter (Signed)
Oral Oncology Patient Advocate Encounter   Was successful in securing patient a $ 10,0000 grant from [employer] to provide copayment coverage for his Imbruvica.  This will keep the out of pocket expense at $0.     I will call the patient to let them know of the approval.  The billing information is as follows and has been shared with Esmeralda.   Member ID: [id number] Group ID: [id number] RxBin: [id number] Dates of Eligibility: 06/01/20 through 08/30/21  Fund:  [place] [facility] [place] Phone [phone] Fax [phone] 08/30/2020 3:01 PM

## 2020-08-30 NOTE — Telephone Encounter (Signed)
Oral Oncology Patient Advocate Encounter  Received notification from [employer] that prior authorization for Imbruvica is required.  PA submitted on CoverMyMeds Key [id number] Status is pending  Oral Oncology Clinic will continue to follow.  James [name] Patient [place] Phone [phone] Fax [phone] 08/30/2020 11:12 AM

## 2020-08-30 NOTE — Telephone Encounter (Signed)
LM on answering machine for post procedure phone call.

## 2020-08-30 NOTE — Progress Notes (Signed)
[place] there [place] OFFICE PROGRESS NOTE  Patient Care Team: [name], MD as PCP - General (Internal Medicine) Cammie Sickle, MD as Consulting Physician (Hematology and Oncology) Borders, Kirt Boys, NP as Nurse Practitioner (Hospice and Palliative Medicine) Verlon Au, NP as Nurse Practitioner (Hematology and Oncology) Jacquelin Hawking, NP as Nurse Practitioner (Oncology) Lonell Face, NP as Nurse Practitioner (Neurosurgery) Milinda Pointer, MD as Consulting Physician (Pain Medicine)  Cancer Staging No matching staging information was found for the patient.   Oncology History Overview Note  # 2006- CLL STAGE IV; MAY 2011- WBC- 57K;Platelets-99;Hb-12/CT Bulky LN; BMBx- 80% Invol; del 11; START Benda-Ritux x4 [finished Sep 2011];   # July 2015-Progression; Sep 2015-START ibrutinib; CT scan DEC 2015- Improvement LN; Cont Ibrutinib 160m/d; NOV 2016 CT- 1-2CM LN [mild progression compared to Dec 2015];NOV 2016- FISH peripheral blood- NO MUTATIONS/CD-38 Positive; NOV 7th- CONT IBRUTINIB 2 pills/day; CT AUG 2017- STABLE;  DEC 6th PET- Mild RP LN/ Retrocrural LN  # OFF ibrutinib [? intol]- sep 2019- Jan 16th 2020; Re-start Ibrutinib; September 2020-stop ibrutinib [poor tolerance/worsening anemia]  # Jan 16th 2020- start aranesp; HOLD while on GElsa   # MARCH 11th 2021- Gazyva x 6 finished AUG 2021.   #April May 2022-worsening anemia- BMBx- CLL; cytogenetics: 20 q del [incidental]; recommend FISH panel.   # SEP 2021- prostate enlargement on CT scan- UA-NEG; PSA- WNL; s/p Dr.Wolff.    # DEC 2019- PAIN CONTRACT  # PALLIATIVE CARE- 06/21/2019-  # October 2019-bone marrow biopsy [worsening anemia]-question dyserythropoietic changes/small clone of CLL;  # [facility] HEM- NEG.  SA skin infection [Oct 2016] s/p clinda -------------------------------------------------------    DIAGNOSIS: CLL  STAGE:   IV      ;GOALS:  palliative  CURRENT/MOST RECENT THERAPY : Ibrutinib    CLL ([place] lymphocytic [place]) [place]  07/08/2019 -  Chemotherapy   The patient had obinutuzumab (GAZYVA) 100 mg in sodium chloride 0.9 % 100 mL (0.9615 mg/mL) chemo infusion, 100 mg, Intravenous, Once, 6 of 6 cycles Administration: 100 mg (07/08/2019), 900 mg (07/09/2019), 1,000 mg (07/26/2019), 1,000 mg (08/16/2019), 1,000 mg (08/02/2019), 1,000 mg (09/13/2019), 1,000 mg (10/11/2019), 1,000 mg (11/09/2019), 1,000 mg (12/07/2019)  for chemotherapy treatment.      INTERVAL HISTORY:  James [id number].  male pleasant patient above history of CLL and chronic abdominal pain of unclear etiology; severe anemia of unclear etiology is here today to follow-up on the results of the bone marrow biopsy.  Patient has been needing blood transfusion once a week for hemoglobin hemoglobin 6-7.   Patient denies any nausea vomiting.  Continues to feel poorly.  Continues to complain of chronic abdominal pain.  No weight loss.  Shortness of breath on exertion.   Review of Systems  Constitutional: Positive for malaise/fatigue. Negative for chills, diaphoresis and fever.  HENT: Negative for nosebleeds and sore throat.   Eyes: Negative for double vision.  Respiratory: Positive for shortness of breath. Negative for cough, hemoptysis, sputum production and wheezing.   Cardiovascular: Negative for chest pain, palpitations, orthopnea and leg swelling.  Gastrointestinal: Positive for abdominal pain, constipation and nausea. Negative for blood in stool, diarrhea, heartburn, melena and vomiting.  Genitourinary: Negative for dysuria, frequency and urgency.  Skin: Negative.  Negative for itching and rash.  Neurological: Negative for dizziness, tingling, focal weakness, weakness and headaches.  Endo/Heme/Allergies: Does not bruise/bleed easily.  Psychiatric/Behavioral: Negative for depression. The patient is not nervous/anxious and does not have insomnia.  PAST MEDICAL HISTORY :  Past Medical History:  Diagnosis Date  . CLL (chronic lymphocytic leukemia) ([place])   . Constipation   . Depression   . Diabetes mellitus without complication ([place])   . GERD (gastroesophageal reflux disease)   . Hematuria   . Hyperlipidemia   . Hypertension   . Therapeutic opioid induced constipation     PAST SURGICAL HISTORY :   Past Surgical History:  Procedure Laterality Date  . CATARACT EXTRACTION W/ INTRAOCULAR LENS IMPLANT Bilateral   . CHOLECYSTECTOMY  1983  . COLONOSCOPY WITH PROPOFOL N/A 10/27/2017   Procedure: COLONOSCOPY WITH PROPOFOL;  Surgeon: Manya Silvas, MD;  Location[facility] ENDOSCOPY;  Service: Endoscopy;  Laterality: N/A;  . ESOPHAGOGASTRODUODENOSCOPY (EGD) WITH PROPOFOL N/A 11/20/2016   Procedure: ESOPHAGOGASTRODUODENOSCOPY (EGD) WITH PROPOFOL;  Surgeon: Manya Silvas, MD;  Location: [facility] ENDOSCOPY;  Service: Endoscopy;  Laterality: N/A;  . ESOPHAGOGASTRODUODENOSCOPY (EGD) WITH PROPOFOL N/A 10/27/2017   Procedure: ESOPHAGOGASTRODUODENOSCOPY (EGD) WITH PROPOFOL;  Surgeon: Manya Silvas, MD;  Location: [facility] ENDOSCOPY;  Service: Endoscopy;  Laterality: N/A;  . INTRATHECAL PUMP IMPLANT Left 02/07/2020   Procedure: INTRATHECAL PUMP & CATHETER IMPLANT;  Surgeon: Deetta Perla, MD;  Location: [facility] ORS;  Service: Neurosurgery;  Laterality: Left;  . LOWER EXTREMITY ANGIOGRAPHY Right 05/19/2017   Procedure: LOWER EXTREMITY ANGIOGRAPHY;  Surgeon: Algernon Huxley, MD;  Location: [place] CV LAB;  Service: Cardiovascular;  Laterality: Right;  . LOWER EXTREMITY ANGIOGRAPHY Left 08/10/2018   Procedure: LOWER EXTREMITY ANGIOGRAPHY;  Surgeon: Algernon Huxley, MD;  Location: [place] CV LAB;  Service: Cardiovascular;  Laterality: Left;    FAMILY HISTORY :   Family History  Problem Relation Age of Onset  . Hypertension Sister     SOCIAL HISTORY:   Social History   Tobacco Use  . Smoking status: Current Every Day Smoker    Packs/day: 0.50     Years: 47.00    Pack years: 23.50    Types: Cigarettes  . Smokeless tobacco: Never Used  Vaping Use  . Vaping Use: Never used  Substance Use Topics  . Alcohol use: Yes    Alcohol/week: 1.0 standard drink    Types: 1 Glasses of wine per week    Comment:  almost none in last 6 months  . Drug use: No    ALLERGIES:  has No Known Allergies.  MEDICATIONS:  Current Outpatient Medications  Medication Sig Dispense Refill  . AMBULATORY NON FORMULARY MEDICATION Medication Name: Intrathecal pump Bupivacaine 20.0 mg/ml Fentanyl 2,000.0 mcg/ml Dose 1846.4 mcg/day    . atorvastatin (LIPITOR) 10 MG tablet Take 1 tablet (10 mg total) by mouth daily. 30 tablet 11  . clopidogrel (PLAVIX) 75 MG tablet Take 75 mg by mouth daily.     . Ensure (ENSURE) Take 237 mLs by mouth 3 (three) times daily between meals.     . folic acid (FOLVITE) 1 MG tablet Take 1 tablet (1 mg total) by mouth daily. 90 tablet 1  . gabapentin (NEURONTIN) 300 MG capsule Take 3 capsules (900 mg total) by mouth at bedtime AND 1 capsule (300 mg total) 2 (two) times daily. 150 capsule 2  . ibrutinib 420 MG TABS Take 1 tablet (420 mg) by mouth daily. 30 tablet 4  . metFORMIN (GLUCOPHAGE) 1000 MG tablet Take 1 tablet (1,000 mg total) by mouth daily with breakfast. 30 tablet 3  . methocarbamol (ROBAXIN) 500 MG tablet Take 1 tablet (500 mg total) by mouth every 6 (six) hours as needed for  muscle spasms. 80 tablet 0  . mirtazapine (REMERON) 30 MG tablet Take 1 tablet (30 mg total) by mouth at bedtime. 30 tablet 3  . naloxegol oxalate (MOVANTIK) 25 MG TABS tablet Take 1 tablet (25 mg total) by mouth daily. 30 tablet 0  . Oxycodone HCl 10 MG TABS Take 1 tablet (10 mg total) by mouth every 4 (four) hours as needed. Must last 30 days 180 tablet 0  . pantoprazole (PROTONIX) 40 MG tablet Take 40 mg by mouth at bedtime.    . naloxone (NARCAN) 2 MG/2ML injection Inject 1 mL (1 mg total) into the muscle as needed for up to 2 doses (for opioid  overdose). In case of emergency (overdose), inject into muscle of upper arm or leg and call 911. (Patient not taking: Reported on 08/30/2020) 2 mL 0  . [START ON 09/09/2020] Oxycodone HCl 10 MG TABS Take 1 tablet (10 mg total) by mouth every 4 (four) hours as needed. Must last 30 days 180 tablet 0   No current facility-administered medications for this visit.    PHYSICAL EXAMINATION: ECOG PERFORMANCE STATUS: 1 - Symptomatic but completely ambulatory  BP (!) 145/80   Pulse 87   Temp (!) 96.1 F (35.6 C) (Tympanic)   Resp 18   Ht 5' 8"  (1.727 m)   Wt 140 lb (63.5 kg)   BMI 21.29 kg/m   Filed Weights   08/30/20 0921  Weight: 140 lb (63.5 kg)    Physical Exam Constitutional:      Comments: He is alone.  Appears pale.  HENT:     Head: Normocephalic and atraumatic.     Mouth/Throat:     Pharynx: No oropharyngeal exudate.  Eyes:     Pupils: Pupils are equal, round, and reactive to light.  Cardiovascular:     Rate and Rhythm: Normal rate and regular rhythm.  Pulmonary:     Effort: No respiratory distress.     Breath sounds: No wheezing.  Abdominal:     General: Bowel sounds are normal. There is no distension.     Palpations: Abdomen is soft. There is no mass.     Tenderness: There is no abdominal tenderness. There is no guarding or rebound.  Musculoskeletal:        General: No tenderness. Normal range of motion.     Cervical back: Normal range of motion and neck supple.  Skin:    General: Skin is warm.     Coloration: Skin is pale.     Comments: Multiple bruises noted in bilateral upper extremity.  Neurological:     Mental Status: He is alert and oriented to person, place, and time.  Psychiatric:        Mood and Affect: Affect normal.       LABORATORY DATA:  I have reviewed the data as listed    Component Value Date/Time   NA 146 (H) 08/30/2020 0843   NA 142 05/03/2014 1139   K 4.3 08/30/2020 0843   K 4.7 05/03/2014 1139   CL 107 08/30/2020 0843   CL 110 (H)  05/03/2014 1139   CO2 26 08/30/2020 0843   CO2 24 05/03/2014 1139   GLUCOSE 93 08/30/2020 0843   GLUCOSE 98 05/03/2014 1139   BUN 24 (H) 08/30/2020 0843   BUN 20 (H) 05/03/2014 1139   CREATININE 1.31 (H) 08/30/2020 0843   CREATININE 1.22 08/09/2014 1122   CALCIUM 9.0 08/30/2020 0843   CALCIUM 8.6 05/03/2014 1139   PROT 7.4 07/18/2020  1045   PROT 7.5 05/03/2014 1139   ALBUMIN 4.8 07/18/2020 1045   ALBUMIN 4.1 05/03/2014 1139   AST 12 (L) 07/18/2020 1045   AST 15 05/03/2014 1139   ALT 9 07/18/2020 1045   ALT 26 05/03/2014 1139   ALKPHOS 77 07/18/2020 1045   ALKPHOS 94 05/03/2014 1139   BILITOT 1.0 07/18/2020 1045   BILITOT 0.5 05/03/2014 1139   GFRNONAA 58 (L) 08/30/2020 0843   GFRNONAA >60 08/09/2014 1122   GFRAA 55 (L) 01/24/2020 1156   GFRAA >60 08/09/2014 1122    No results found for: SPEP, UPEP  Lab Results  Component Value Date   WBC 5.8 08/30/2020   NEUTROABS 1.4 (L) 08/30/2020   HGB 7.8 (L) 08/30/2020   HCT 24.4 (L) 08/30/2020   MCV 101.7 (H) 08/30/2020   PLT 131 (L) 08/30/2020      Chemistry      Component Value Date/Time   NA 146 (H) 08/30/2020 0843   NA 142 05/03/2014 1139   K 4.3 08/30/2020 0843   K 4.7 05/03/2014 1139   CL 107 08/30/2020 0843   CL 110 (H) 05/03/2014 1139   CO2 26 08/30/2020 0843   CO2 24 05/03/2014 1139   BUN 24 (H) 08/30/2020 0843   BUN 20 (H) 05/03/2014 1139   CREATININE 1.31 (H) 08/30/2020 0843   CREATININE 1.22 08/09/2014 1122      Component Value Date/Time   CALCIUM 9.0 08/30/2020 0843   CALCIUM 8.6 05/03/2014 1139   ALKPHOS 77 07/18/2020 1045   ALKPHOS 94 05/03/2014 1139   AST 12 (L) 07/18/2020 1045   AST 15 05/03/2014 1139   ALT 9 07/18/2020 1045   ALT 26 05/03/2014 1139   BILITOT 1.0 07/18/2020 1045   BILITOT 0.5 05/03/2014 1139       RADIOGRAPHIC STUDIES: I have personally reviewed the radiological images as listed and agreed with the findings in the report. No results found.   ASSESSMENT & PLAN:  CLL  (chronic lymphocytic leukemia) ([place]) # CLL/SLL- relapsed most recently s/p Gazyva x6 cycles [AUG 2021] 15th MARCH 2022- No evidence of active lymphoma by FDG PET scan; Stable mild mediastinal adenopathy and moderate periaortic retroperitoneal adenopathy. No significant metabolic activity in these nodes; HOWEVER BONE MARROW BIOPSY- [April 2022]- RECURRENT/RELPASED CLL; recommend CLL FISH panel. Discussed with Dr.Smir.   # PLAN TO START IBRUTINIB 420 mg/day.-We will monitor closely given previous intolerance [fatigue arthralgias/anemia].  Again reviewed the potential side effects including but not limited to hypertension/arrhythmias joint pains etc.  Discussed with Ebony Hail. If not responding would recommend- venatoclax [high risk for TLS with CKD/ hx of intermittent hyperkalemia]   #Severe anemia likely secondary to CLL-/CKD -hemoglobin 7.4- s/p PRBC transfusion; declines blood transfusion today.  Proceed with Aranesp today.  Plan PRBC transfusion next week.  #Chronic abdominal pain-unclear etiology- STABLE; currently being titrated ; on pain pump as per pain management.   # weight loss/depression; declines nutrition; continue  remeron 30 mg qhs.   #Hyperkalemia-5.1; STABLE today- /CKD-III  [baseline 1.4; Dr.Lateef]; STABLE; continue LoKelma as per Nephrology.   I spoke at length with the patient's Son, [name] regarding the patient's clinical status/plan of care.  Family agreement.   # DISPOSITION: # Aranesp today; no PRBC  # Next week- labs-cbc/holdtube- 1 unit of PRBC  # in 2 weeks- lab- cbc/hold tube- 1unit PRBC  # follow up in 3 weeks- MD;lab- cbc/hold tube- 1unit PRBC -Dr.B  # 40 minutes face-to-face with the patient discussing the above plan  of care; more than 50% of time spent on prognosis/ natural history; counseling and coordination.   No orders of the defined types were placed in this encounter.  All questions were answered. The patient knows to call the clinic with any problems,  questions or concerns.      Cammie Sickle, MD 08/30/2020 1:39 PM

## 2020-08-30 NOTE — Telephone Encounter (Signed)
Oral Oncology Pharmacist Encounter  Received new prescription for Imbruvica (ibrutinib) for the treatment of recurrent/relapsed CLL, planned duration until control progression or unacceptable drug toxicity. He previously stopped ibrutinib due to intolerance, he now needs to resume treatment for him CLL  CMP from 07/18/20 and CBC from 08/30/20 assessed, no relevant lab abnormalities. Prescription dose and frequency assessed.   Current medication list in Epic reviewed, one DDIs with ibrutinib identified: - Clopidogrel: Ibrutinib may enhance the adverse/toxic effect of medications with antiplatelet properties like clopidogrel. No baseline dose adjustment needed. Monitor patient's pltc.  Evaluated chart and no patient barriers to medication adherence identified.   Prescription has been [facility]-scribed to the [facility] for benefits analysis and approval.  Oral Oncology Clinic will continue to follow for insurance authorization, copayment issues, initial counseling and start date.  Patient agreed to treatment on 08/30/20 per MD documentation.  Darl Pikes, PharmD, BCPS, BCOP, CPP Hematology/Oncology Clinical Pharmacist Practitioner [facility]/HP/AP [facility] [place] [facility] [phone]  08/30/2020 12:43 PM

## 2020-08-30 NOTE — Telephone Encounter (Signed)
Post pump refill phone call.  Mailbox is full.  Unable to leave message.

## 2020-08-30 NOTE — Assessment & Plan Note (Addendum)
#  CLL/SLL- relapsed most recently s/p Gazyva x6 cycles [AUG 2021] 15th MARCH 2022- No evidence of active lymphoma by FDG PET scan; Stable mild mediastinal adenopathy and moderate periaortic retroperitoneal adenopathy. No significant metabolic activity in these nodes; HOWEVER BONE MARROW BIOPSY- [April 2022]- RECURRENT/RELPASED CLL; recommend CLL FISH panel. Discussed with Dr.Smir.   # PLAN TO START IBRUTINIB 420 mg/day.-We will monitor closely given previous intolerance [fatigue arthralgias/anemia].  Again reviewed the potential side effects including but not limited to hypertension/arrhythmias joint pains etc.  Discussed with Ebony Hail. If not responding would recommend- venatoclax [high risk for TLS with CKD/ hx of intermittent hyperkalemia]   #Severe anemia likely secondary to CLL-/CKD -hemoglobin 7.4- s/p PRBC transfusion; declines blood transfusion today.  Proceed with Aranesp today.  Plan PRBC transfusion next week.  #Chronic abdominal pain-unclear etiology- STABLE; currently being titrated ; on pain pump as per pain management.   # weight loss/depression; declines nutrition; continue  remeron 30 mg qhs.   #Hyperkalemia-5.1; STABLE today- /CKD-III  [baseline 1.4; Dr.Lateef]; STABLE; continue LoKelma as per Nephrology.   I spoke at length with the patient's Son, [name] regarding the patient's clinical status/plan of care.  Family agreement.   # DISPOSITION: # Aranesp today; no PRBC  # Next week- labs-cbc/holdtube- 1 unit of PRBC  # in 2 weeks- lab- cbc/hold tube- 1unit PRBC  # follow up in 3 weeks- MD;lab- cbc/hold tube- 1unit PRBC -Dr.B  # 40 minutes face-to-face with the patient discussing the above plan of care; more than 50% of time spent on prognosis/ natural history; counseling and coordination.

## 2020-08-31 ENCOUNTER — Other Ambulatory Visit (HOSPITAL_COMMUNITY): Payer: Self-pay

## 2020-08-31 ENCOUNTER — Ambulatory Visit: Payer: Medicare Other | Admitting: Pain Medicine

## 2020-08-31 MED ORDER — IBRUTINIB 420 MG PO TABS
420.0000 mg | ORAL_TABLET | Freq: Every day | ORAL | 4 refills | Status: DC
  Filled 2020-08-31: qty 28, fill #0
  Filled 2020-09-01: qty 28, 28d supply, fill #0
  Filled 2020-10-02: qty 28, 28d supply, fill #1
  Filled 2020-10-26: qty 28, 28d supply, fill #2
  Filled 2020-11-28: qty 28, 28d supply, fill #3

## 2020-08-31 MED FILL — Medication: INTRATHECAL | Qty: 1 | Status: AC

## 2020-09-01 ENCOUNTER — Other Ambulatory Visit (HOSPITAL_COMMUNITY): Payer: Self-pay

## 2020-09-01 NOTE — Telephone Encounter (Signed)
Oral Chemotherapy Pharmacist Encounter  Patient Education I spoke with patient for overview of new oral chemotherapy medication: Imbruvica (ibrutinib) for the treatment of recurrent/relapsed CLL, planned duration until control progression or unacceptable drug toxicity. He previously stopped ibrutinib due to intolerance, he now needs to resume treatment for him CLL.   Pt is doing well. Counseled patient on administration, dosing, side effects, monitoring, drug-food interactions, safe handling, storage, and disposal. Patient will take 420 mg by mouth daily.  Side effects include but not limited to: fatigue, N/V, edema, diarrhea, decreased wbc/plt/hgb.    Reviewed with patient importance of keeping a medication schedule and plan for any missed doses.  After discussion with patient no patient barriers to medication adherence identified.   James [name] voiced understanding and appreciation. All questions answered. Medication handout provided.  Provided patient with Oral [facility] phone number. Patient knows to call the office with questions or concerns. Oral Chemotherapy Navigation Clinic will continue to follow.  James [name], PharmD, BCPS, BCOP, CPP Hematology/Oncology Clinical Pharmacist Practitioner [facility] [place] [facility] [phone]  09/01/2020 3:04 PM

## 2020-09-04 ENCOUNTER — Other Ambulatory Visit (HOSPITAL_COMMUNITY): Payer: Self-pay

## 2020-09-04 NOTE — Telephone Encounter (Signed)
Oral Oncology Patient Advocate Encounter  I spoke with Mr Skowronek on 09/01/20 to set up delivery of Imbruvica.  Address verified for shipment.  Kate Sable will be filled through [facility] and mailed 5/9 for delivery 5/10.    [place] will call 7-10 days before next refill is due to complete adherence call and set up delivery of medication.     [place] [facility] [place] Phone [phone] Fax [phone] 09/04/2020 10:18 AM

## 2020-09-05 ENCOUNTER — Other Ambulatory Visit (HOSPITAL_COMMUNITY): Payer: Self-pay

## 2020-09-06 ENCOUNTER — Inpatient Hospital Stay: Payer: Medicare Other

## 2020-09-06 ENCOUNTER — Encounter (HOSPITAL_COMMUNITY): Payer: Self-pay | Admitting: Internal Medicine

## 2020-09-06 ENCOUNTER — Other Ambulatory Visit: Payer: Self-pay | Admitting: *Deleted

## 2020-09-06 ENCOUNTER — Other Ambulatory Visit: Payer: Self-pay

## 2020-09-06 DIAGNOSIS — D631 Anemia in chronic kidney disease: Secondary | ICD-10-CM

## 2020-09-06 DIAGNOSIS — N183 Chronic kidney disease, stage 3 unspecified: Secondary | ICD-10-CM

## 2020-09-06 DIAGNOSIS — C9110 Chronic lymphocytic leukemia of B-cell type not having achieved remission: Secondary | ICD-10-CM

## 2020-09-06 DIAGNOSIS — N189 Chronic kidney disease, unspecified: Secondary | ICD-10-CM | POA: Diagnosis not present

## 2020-09-06 DIAGNOSIS — D649 Anemia, unspecified: Secondary | ICD-10-CM

## 2020-09-06 LAB — CBC WITH DIFFERENTIAL/PLATELET
Abs Immature Granulocytes: 0.01 10*3/uL (ref 0.00–0.07)
Basophils Absolute: 0.1 10*3/uL (ref 0.0–0.1)
Basophils Relative: 2 %
Eosinophils Absolute: 0.1 10*3/uL (ref 0.0–0.5)
Eosinophils Relative: 1 %
HCT: 23.8 % — ABNORMAL LOW (ref 39.0–52.0)
Hemoglobin: 7.4 g/dL — ABNORMAL LOW (ref 13.0–17.0)
Immature Granulocytes: 0 %
Lymphocytes Relative: 50 %
Lymphs Abs: 3.2 10*3/uL (ref 0.7–4.0)
MCH: 31.9 pg (ref 26.0–34.0)
MCHC: 31.1 g/dL (ref 30.0–36.0)
MCV: 102.6 fL — ABNORMAL HIGH (ref 80.0–100.0)
Monocytes Absolute: 1.8 10*3/uL — ABNORMAL HIGH (ref 0.1–1.0)
Monocytes Relative: 28 %
Neutro Abs: 1.2 10*3/uL — ABNORMAL LOW (ref 1.7–7.7)
Neutrophils Relative %: 19 %
Platelets: 163 10*3/uL (ref 150–400)
RBC: 2.32 MIL/uL — ABNORMAL LOW (ref 4.22–5.81)
RDW: 21.6 % — ABNORMAL HIGH (ref 11.5–15.5)
Smear Review: NORMAL
WBC: 6.4 10*3/uL (ref 4.0–10.5)
nRBC: 0 % (ref 0.0–0.2)

## 2020-09-06 LAB — PREPARE RBC (CROSSMATCH)

## 2020-09-06 MED ORDER — DIPHENHYDRAMINE HCL 25 MG PO CAPS
25.0000 mg | ORAL_CAPSULE | Freq: Once | ORAL | Status: AC
Start: 2020-09-06 — End: 2020-09-06
  Administered 2020-09-06: 25 mg via ORAL
  Filled 2020-09-06: qty 1

## 2020-09-06 MED ORDER — SODIUM CHLORIDE 0.9% IV SOLUTION
250.0000 mL | Freq: Once | INTRAVENOUS | Status: AC
  Administered 2020-09-06: 250 mL via INTRAVENOUS
  Filled 2020-09-06: qty 250

## 2020-09-06 MED ORDER — PAIN MANAGEMENT IT PUMP REFILL: 1.0000 | Freq: Once | INTRATHECAL | 0 refills | Status: AC

## 2020-09-06 MED ORDER — ACETAMINOPHEN 325 MG PO TABS
650.0000 mg | ORAL_TABLET | Freq: Once | ORAL | Status: AC
  Administered 2020-09-06: 650 mg via ORAL
  Filled 2020-09-06: qty 2

## 2020-09-06 NOTE — Progress Notes (Signed)
Hemoglobin: 7.4. MD, Dr. Rogue Bussing, notified and aware. Per MD order: proceed with 1 unit pRBCs transfusion today; MD placing orders.

## 2020-09-06 NOTE — Patient Instructions (Signed)
[facility]    Discharge Instructions:  Thank you for choosing [facility] to provide your oncology and hematology care.  If you have a lab appointment with the [facility], please go directly to the [facility] and check in at the registration area.  We strive to give you quality time with your provider. You may need to reschedule your appointment if you arrive late (15 or more minutes).  Arriving late affects you and other patients whose appointments are after yours.  Also, if you miss three or more appointments without notifying the office, you may be dismissed from the clinic at the provider's discretion.      For prescription refill requests, have your pharmacy contact our office and allow 72 hours for refills to be completed.    Today you received the following treatment: 1 unit of packed red blood cells (blood transfusion).    BELOW ARE SYMPTOMS THAT SHOULD BE REPORTED IMMEDIATELY: . *FEVER GREATER THAN 100.4 F (38 C) OR HIGHER . *CHILLS OR SWEATING . *NAUSEA AND VOMITING THAT IS NOT CONTROLLED WITH YOUR NAUSEA MEDICATION . *UNUSUAL SHORTNESS OF BREATH . *UNUSUAL BRUISING OR BLEEDING . *URINARY PROBLEMS (pain or burning when urinating, or frequent urination) . *BOWEL PROBLEMS (unusual diarrhea, constipation, pain near the anus) . TENDERNESS IN MOUTH AND THROAT WITH OR WITHOUT PRESENCE OF ULCERS (sore throat, sores in mouth, or a toothache) . UNUSUAL RASH, SWELLING OR PAIN  . UNUSUAL VAGINAL DISCHARGE OR ITCHING   Items with * indicate a potential emergency and should be followed up as soon as possible or go to the Emergency Department if any problems should occur.  Should you have questions after your visit or need to cancel or reschedule your appointment, please contact [facility]  [phone] and follow the prompts.  Office hours are 8:00 a.m. to 4:30 p.m. Monday - Friday. Please note  that voicemails left after 4:00 p.m. may not be returned until the following business day.  We are closed weekends and major holidays. You have access to a nurse at all times for urgent questions. Please call the main number to the clinic [phone] and follow the prompts.  For any non-urgent questions, you may also contact your provider using MyChart. We now offer e-Visits for anyone 22 and older to request care online for non-urgent symptoms. For details visit [email or web address].   Also download the MyChart app! Go to the app store, search "MyChart", open the app, select [facility], and log in with your MyChart username and password.  Due to Covid, a mask is required upon entering the hospital/clinic. If you do not have a mask, one will be given to you upon arrival. For doctor visits, patients may have 1 support person aged 68 or older with them. For treatment visits, patients cannot have anyone with them due to current Covid guidelines and our immunocompromised population.   Blood Transfusion, Adult  A blood transfusion is a procedure in which you receive blood or a type of blood cell (blood component) through an IV. You may need a blood transfusion when your blood level is low. This may result from a bleeding disorder, illness, injury, or surgery. The blood may come from a donor. You may also be able to donate blood for yourself (autologous blood donation) before a planned surgery. The blood given in a transfusion is made up of different blood components. You may receive:  Red blood cells. These carry oxygen  to the cells in the body.  Platelets. These help your blood to clot.  Plasma. This is the liquid part of your blood. It carries proteins and other substances throughout the body.  White blood cells. These help you fight infections. If you have hemophilia or another clotting disorder, you may also receive other types of blood products. Tell a health care provider about:  Any  blood disorders you have.  Any previous reactions you have had during a blood transfusion.  Any allergies you have.  All medicines you are taking, including vitamins, herbs, eye drops, creams, and over-the-counter medicines.  Any surgeries you have had.  Any medical conditions you have, including any recent fever or cold symptoms.  Whether you are pregnant or may be pregnant. What are the risks? Generally, this is a safe procedure. However, problems may occur.  The most common problems include: ? A mild allergic reaction, such as red, swollen areas of skin (hives) and itching. ? Fever or chills. This may be the body's response to new blood cells received. This may occur during or up to 4 hours after the transfusion.  More serious problems may include: ? Transfusion-associated circulatory overload (TACO), or too much fluid in the lungs. This may cause breathing problems. ? A serious allergic reaction, such as difficulty breathing or swelling around the face and lips. ? Transfusion-related acute lung injury (TRALI), which causes breathing difficulty and low oxygen in the blood. This can occur within hours of the transfusion or several days later. ? Iron overload. This can happen after receiving many blood transfusions over a period of time. ? Infection or virus being transmitted. This is rare because donated blood is carefully tested before it is given. ? Hemolytic transfusion reaction. This is rare. It happens when your body's defense system (immune system)tries to attack the new blood cells. Symptoms may include fever, chills, nausea, low blood pressure, and low back or chest pain. ? Transfusion-associated graft-versus-host disease (TAGVHD). This is rare. It happens when donated cells attack your body's healthy tissues. What happens before the procedure? Medicines Ask your health care provider about:  Changing or stopping your regular medicines. This is especially important if you are  taking diabetes medicines or blood thinners.  Taking medicines such as aspirin and ibuprofen. These medicines can thin your blood. Do not take these medicines unless your health care provider tells you to take them.  Taking over-the-counter medicines, vitamins, herbs, and supplements. General instructions  Follow instructions from your health care provider about eating and drinking restrictions.  You will have a blood test to determine your blood type. This is necessary to know what kind of blood your body will accept and to match it to the donor blood.  If you are going to have a planned surgery, you may be able to do an autologous blood donation. This may be done in case you need to have a transfusion.  You will have your temperature, blood pressure, and pulse monitored before the transfusion.  If you have had an allergic reaction to a transfusion in the past, you may be given medicine to help prevent a reaction. This medicine may be given to you by mouth (orally) or through an IV.  Set aside time for the blood transfusion. This procedure generally takes 1-4 hours to complete. What happens during the procedure?  An IV will be inserted into one of your veins.  The bag of donated blood will be attached to your IV. The blood will then  enter through your vein.  Your temperature, blood pressure, and pulse will be monitored regularly during the transfusion. This monitoring is done to detect early signs of a transfusion reaction.  Tell your nurse right away if you have any of these symptoms during the transfusion: ? Shortness of breath or trouble breathing. ? Chest or back pain. ? Fever or chills. ? Hives or itching.  If you have any signs or symptoms of a reaction, your transfusion will be stopped and you may be given medicine.  When the transfusion is complete, your IV will be removed.  Pressure may be applied to the IV site for a few minutes.  A bandage (dressing)will be  applied. The procedure may vary among health care providers and hospitals.   What happens after the procedure?  Your temperature, blood pressure, pulse, breathing rate, and blood oxygen level will be monitored until you leave the hospital or clinic.  Your blood may be tested to see how you are responding to the transfusion.  You may be warmed with fluids or blankets to maintain a normal body temperature.  If you receive your blood transfusion in an outpatient setting, you will be told whom to contact to report any reactions. Where to find more information For more information on blood transfusions, visit the American Red Cross: redcross.org Summary  A blood transfusion is a procedure in which you receive blood or a type of blood cell (blood component) through an IV.  The blood you receive may come from a donor or be donated by yourself (autologous blood donation) before a planned surgery.  The blood given in a transfusion is made up of different blood components. You may receive red blood cells, platelets, plasma, or white blood cells depending on the condition treated.  Your temperature, blood pressure, and pulse will be monitored before, during, and after the transfusion.  After the transfusion, your blood may be tested to see how your body has responded. This information is not intended to replace advice given to you by your health care provider. Make sure you discuss any questions you have with your health care provider. Document Revised: 02/18/2019 Document Reviewed: 10/08/2018 Elsevier Patient Education  2021 [employer].  Blood Transfusion, Adult, Care After This sheet gives you information about how to care for yourself after your procedure. Your doctor may also give you more specific instructions. If you have problems or questions, contact your doctor. What can I expect after the procedure? After the procedure, it is common to have:  Bruising and soreness at the IV  site.  A fever or chills on the day of the procedure. This may be your body's response to the new blood cells received.  A headache. Follow these instructions at home: Insertion site care  Follow instructions from your doctor about how to take care of your insertion site. This is where an IV tube was put into your vein. Make sure you: ? Wash your hands with soap and water before and after you change your bandage (dressing). If you cannot use soap and water, use hand sanitizer. ? Change your bandage as told by your doctor.  Check your insertion site every day for signs of infection. Check for: ? Redness, swelling, or pain. ? Bleeding from the site. ? Warmth. ? Pus or a bad smell.      General instructions  Take over-the-counter and prescription medicines only as told by your doctor.  Rest as told by your doctor.  Go back to your  normal activities as told by your doctor.  Keep all follow-up visits as told by your doctor. This is important. Contact a doctor if:  You have itching or red, swollen areas of skin (hives).  You feel worried or nervous (anxious).  You feel weak after doing your normal activities.  You have redness, swelling, warmth, or pain around the insertion site.  You have blood coming from the insertion site, and the blood does not stop with pressure.  You have pus or a bad smell coming from the insertion site. Get help right away if:  You have signs of a serious reaction. This may be coming from an allergy or the body's defense system (immune system). Signs include: ? Trouble breathing or shortness of breath. ? Swelling of the face or feeling warm (flushed). ? Fever or chills. ? Head, chest, or back pain. ? Dark pee (urine) or blood in the pee. ? Widespread rash. ? Fast heartbeat. ? Feeling dizzy or light-headed. You may receive your blood transfusion in an outpatient setting. If so, you will be told whom to contact to report any reactions. These  symptoms may be an emergency. Do not wait to see if the symptoms will go away. Get medical help right away. Call your local emergency services (911 in the [place] Do not drive yourself to the hospital. Summary  Bruising and soreness at the IV site are common.  Check your insertion site every day for signs of infection.  Rest as told by your doctor. Go back to your normal activities as told by your doctor.  Get help right away if you have signs of a serious reaction. This information is not intended to replace advice given to you by your health care provider. Make sure you discuss any questions you have with your health care provider. Document Revised: 10/08/2018 Document Reviewed: 10/08/2018 Elsevier Patient Education  [employer].

## 2020-09-07 LAB — BPAM RBC
Blood Product Expiration Date: 202205172359
ISSUE DATE / TIME: 202205111220
Unit Type and Rh: 9500

## 2020-09-07 LAB — TYPE AND SCREEN
ABO/RH(D): A POS
Antibody Screen: NEGATIVE
Unit division: 0

## 2020-09-13 ENCOUNTER — Inpatient Hospital Stay: Payer: Medicare Other

## 2020-09-13 ENCOUNTER — Other Ambulatory Visit: Payer: Self-pay

## 2020-09-13 DIAGNOSIS — N189 Chronic kidney disease, unspecified: Secondary | ICD-10-CM | POA: Diagnosis not present

## 2020-09-13 DIAGNOSIS — C9110 Chronic lymphocytic leukemia of B-cell type not having achieved remission: Secondary | ICD-10-CM

## 2020-09-13 DIAGNOSIS — D649 Anemia, unspecified: Secondary | ICD-10-CM

## 2020-09-13 LAB — CBC WITH DIFFERENTIAL/PLATELET
Abs Immature Granulocytes: 0.02 10*3/uL (ref 0.00–0.07)
Basophils Absolute: 0.1 10*3/uL (ref 0.0–0.1)
Basophils Relative: 0 %
Eosinophils Absolute: 0 10*3/uL (ref 0.0–0.5)
Eosinophils Relative: 0 %
HCT: 25.2 % — ABNORMAL LOW (ref 39.0–52.0)
Hemoglobin: 8.1 g/dL — ABNORMAL LOW (ref 13.0–17.0)
Immature Granulocytes: 0 %
Lymphocytes Relative: 67 %
Lymphs Abs: 7.4 10*3/uL — ABNORMAL HIGH (ref 0.7–4.0)
MCH: 32.7 pg (ref 26.0–34.0)
MCHC: 32.1 g/dL (ref 30.0–36.0)
MCV: 101.6 fL — ABNORMAL HIGH (ref 80.0–100.0)
Monocytes Absolute: 1.7 10*3/uL — ABNORMAL HIGH (ref 0.1–1.0)
Monocytes Relative: 15 %
Neutro Abs: 2 10*3/uL (ref 1.7–7.7)
Neutrophils Relative %: 18 %
Platelets: 123 10*3/uL — ABNORMAL LOW (ref 150–400)
RBC: 2.48 MIL/uL — ABNORMAL LOW (ref 4.22–5.81)
RDW: 20.9 % — ABNORMAL HIGH (ref 11.5–15.5)
Smear Review: DECREASED
WBC Morphology: ABNORMAL
WBC: 11.2 10*3/uL — ABNORMAL HIGH (ref 4.0–10.5)
nRBC: 0 % (ref 0.0–0.2)

## 2020-09-13 LAB — SAMPLE TO BLOOD BANK

## 2020-09-13 NOTE — Progress Notes (Signed)
HGB 8.1. Patient denies any s/s at this time other than fatigue which he states is at his baseline. Patient states that he prefers to not get PRBC this AM, if it is not necessary. Dr. Grayland Ormond made aware. Per Dr. Grayland Ormond, no blood is needed today. Patient reminded of upcoming appt. And advised to reach out to the clinic prior to then should he have any s/s. Patient verbalizes understanding and denies any further questions or concerns.

## 2020-09-20 ENCOUNTER — Inpatient Hospital Stay: Payer: Medicare Other

## 2020-09-20 ENCOUNTER — Other Ambulatory Visit: Payer: Self-pay

## 2020-09-20 ENCOUNTER — Encounter: Payer: Self-pay | Admitting: Internal Medicine

## 2020-09-20 ENCOUNTER — Inpatient Hospital Stay (HOSPITAL_BASED_OUTPATIENT_CLINIC_OR_DEPARTMENT_OTHER): Payer: Medicare Other | Admitting: Internal Medicine

## 2020-09-20 VITALS — BP 139/64 | HR 86 | Temp 98.0°F | Resp 16 | Wt 140.6 lb

## 2020-09-20 VITALS — BP 112/70 | HR 68 | Temp 97.2°F | Resp 18

## 2020-09-20 DIAGNOSIS — D649 Anemia, unspecified: Secondary | ICD-10-CM

## 2020-09-20 DIAGNOSIS — C9110 Chronic lymphocytic leukemia of B-cell type not having achieved remission: Secondary | ICD-10-CM | POA: Diagnosis not present

## 2020-09-20 DIAGNOSIS — N189 Chronic kidney disease, unspecified: Secondary | ICD-10-CM | POA: Diagnosis not present

## 2020-09-20 LAB — CBC WITH DIFFERENTIAL/PLATELET
Abs Immature Granulocytes: 0.02 10*3/uL (ref 0.00–0.07)
Basophils Absolute: 0.1 10*3/uL (ref 0.0–0.1)
Basophils Relative: 1 %
Eosinophils Absolute: 0 10*3/uL (ref 0.0–0.5)
Eosinophils Relative: 0 %
HCT: 21.7 % — ABNORMAL LOW (ref 39.0–52.0)
Hemoglobin: 7 g/dL — ABNORMAL LOW (ref 13.0–17.0)
Immature Granulocytes: 0 %
Lymphocytes Relative: 78 %
Lymphs Abs: 9.9 10*3/uL — ABNORMAL HIGH (ref 0.7–4.0)
MCH: 33 pg (ref 26.0–34.0)
MCHC: 32.3 g/dL (ref 30.0–36.0)
MCV: 102.4 fL — ABNORMAL HIGH (ref 80.0–100.0)
Monocytes Absolute: 0.7 10*3/uL (ref 0.1–1.0)
Monocytes Relative: 6 %
Neutro Abs: 1.9 10*3/uL (ref 1.7–7.7)
Neutrophils Relative %: 15 %
Platelets: 142 10*3/uL — ABNORMAL LOW (ref 150–400)
RBC: 2.12 MIL/uL — ABNORMAL LOW (ref 4.22–5.81)
RDW: 22.2 % — ABNORMAL HIGH (ref 11.5–15.5)
WBC: 12.7 10*3/uL — ABNORMAL HIGH (ref 4.0–10.5)
nRBC: 0 % (ref 0.0–0.2)

## 2020-09-20 LAB — SAMPLE TO BLOOD BANK

## 2020-09-20 LAB — PREPARE RBC (CROSSMATCH)

## 2020-09-20 MED ORDER — SODIUM CHLORIDE 0.9% IV SOLUTION
250.0000 mL | Freq: Once | INTRAVENOUS | Status: AC
  Administered 2020-09-20: 250 mL via INTRAVENOUS
  Filled 2020-09-20: qty 250

## 2020-09-20 MED ORDER — DIPHENHYDRAMINE HCL 25 MG PO CAPS
25.0000 mg | ORAL_CAPSULE | Freq: Once | ORAL | Status: AC
  Administered 2020-09-20: 25 mg via ORAL
  Filled 2020-09-20: qty 1

## 2020-09-20 MED ORDER — ACETAMINOPHEN 325 MG PO TABS
650.0000 mg | ORAL_TABLET | Freq: Once | ORAL | Status: AC
  Administered 2020-09-20: 650 mg via ORAL

## 2020-09-20 NOTE — Progress Notes (Signed)
Should there [facility] [place] OFFICE PROGRESS NOTE  Patient Care Team: [name], MD as PCP - General (Internal Medicine) Cammie Sickle, MD as Consulting Physician (Hematology and Oncology) Borders, Kirt Boys, NP as Nurse Practitioner (Hospice and Palliative Medicine) Verlon Au, NP as Nurse Practitioner (Hematology and Oncology) Jacquelin Hawking, NP as Nurse Practitioner (Oncology) Lonell Face, NP as Nurse Practitioner (Neurosurgery) Milinda Pointer, MD as Consulting Physician (Pain Medicine)  Cancer Staging No matching staging information was found for the patient.   Oncology History Overview Note  # 2006- CLL STAGE IV; MAY 2011- WBC- 57K;Platelets-99;Hb-12/CT Bulky LN; BMBx- 80% Invol; del 11; START Benda-Ritux x4 [finished Sep 2011];   # July 2015-Progression; Sep 2015-START ibrutinib; CT scan DEC 2015- Improvement LN; Cont Ibrutinib 158m/d; NOV 2016 CT- 1-2CM LN [mild progression compared to Dec 2015];NOV 2016- FISH peripheral blood- NO MUTATIONS/CD-38 Positive; NOV 7th- CONT IBRUTINIB 2 pills/day; CT AUG 2017- STABLE;  DEC 6th PET- Mild RP LN/ Retrocrural LN  # OFF ibrutinib [? intol]- sep 2019- Jan 16th 2020; Re-start Ibrutinib; September 2020-stop ibrutinib [poor tolerance/worsening anemia]  # Jan 16th 2020- start aranesp; HOLD while on GDeaver   # MARCH 11th 2021- Gazyva x 6 finished AUG 2021.   #April May 2022-worsening anemia- BMBx- CLL; cytogenetics: 20 q del [incidental]; rFISH- 17p/11q DEL  # Early May 2022- START IBRUTINIB 420 mg/day  # SEP 2021- prostate enlargement on CT scan- UA-NEG; PSA- WNL; s/p Dr.Wolff.    # DEC 2019- PAIN CONTRACT  # PALLIATIVE CARE- 06/21/2019-  # October 2019-bone marrow biopsy [worsening anemia]-question dyserythropoietic changes/small clone of CLL;  # [facility] HEM- NEG.  SA skin infection [Oct 2016] s/p clinda -------------------------------------------------------    DIAGNOSIS:  CLL  STAGE:   IV      ;GOALS: palliative  CURRENT/MOST RECENT THERAPY : Ibrutinib    CLL (chronic lymphocytic leukemia) ([place]  07/08/2019 -  Chemotherapy   The patient had obinutuzumab (GAZYVA) 100 mg in sodium chloride 0.9 % 100 mL (0.9615 mg/mL) chemo infusion, 100 mg, Intravenous, Once, 6 of 6 cycles Administration: 100 mg (07/08/2019), 900 mg (07/09/2019), 1,000 mg (07/26/2019), 1,000 mg (08/16/2019), 1,000 mg (08/02/2019), 1,000 mg (09/13/2019), 1,000 mg (10/11/2019), 1,000 mg (11/09/2019), 1,000 mg (12/07/2019)  for chemotherapy treatment.      INTERVAL HISTORY:  FSERAPHIM [id number].  male pleasant patient above history of CLL and chronic abdominal pain of unclear etiology; severe anemia of unclear etiology is here for follow-up.  Patient was started on ibrutinib approximately 3 weeks ago.  He admits to being compliant with the medication.  Denies any worsening abdominal pain nausea vomiting.  No diarrhea.  He continues to have chronic abdominal pain markedly worse.  He denies any bleeding.   Review of Systems  Constitutional: Positive for malaise/fatigue. Negative for chills, diaphoresis and fever.  HENT: Negative for nosebleeds and sore throat.   Eyes: Negative for double vision.  Respiratory: Positive for shortness of breath. Negative for cough, hemoptysis, sputum production and wheezing.   Cardiovascular: Negative for chest pain, palpitations, orthopnea and leg swelling.  Gastrointestinal: Positive for abdominal pain, constipation and nausea. Negative for blood in stool, diarrhea, heartburn, melena and vomiting.  Genitourinary: Negative for dysuria, frequency and urgency.  Skin: Negative.  Negative for itching and rash.  Neurological: Negative for dizziness, tingling, focal weakness, weakness and headaches.  Endo/Heme/Allergies: Does not bruise/bleed easily.  Psychiatric/Behavioral: Negative for depression. The patient is not nervous/anxious and does not have insomnia.  PAST  MEDICAL HISTORY :  Past Medical History:  Diagnosis Date  . CLL (chronic lymphocytic leukemia) ([place])   . Constipation   . Depression   . Diabetes mellitus without complication ([place])   . GERD (gastroesophageal reflux disease)   . Hematuria   . Hyperlipidemia   . Hypertension   . Therapeutic opioid induced constipation     PAST SURGICAL HISTORY :   Past Surgical History:  Procedure Laterality Date  . CATARACT EXTRACTION W/ INTRAOCULAR LENS IMPLANT Bilateral   . CHOLECYSTECTOMY  1983  . COLONOSCOPY WITH PROPOFOL N/A 10/27/2017   Procedure: COLONOSCOPY WITH PROPOFOL;  Surgeon: Manya Silvas, MD;  Location: [facility] ENDOSCOPY;  Service: Endoscopy;  Laterality: N/A;  . ESOPHAGOGASTRODUODENOSCOPY (EGD) WITH PROPOFOL N/A 11/20/2016   Procedure: ESOPHAGOGASTRODUODENOSCOPY (EGD) WITH PROPOFOL;  Surgeon: Manya Silvas, MD;  Location[facility] ENDOSCOPY;  Service: Endoscopy;  Laterality: N/A;  . ESOPHAGOGASTRODUODENOSCOPY (EGD) WITH PROPOFOL N/A 10/27/2017   Procedure: ESOPHAGOGASTRODUODENOSCOPY (EGD) WITH PROPOFOL;  Surgeon: Manya Silvas, MD;  Location: [facility] ENDOSCOPY;  Service: Endoscopy;  Laterality: N/A;  . INTRATHECAL PUMP IMPLANT Left 02/07/2020   Procedure: INTRATHECAL PUMP & CATHETER IMPLANT;  Surgeon: Deetta Perla, MD;  Location: [facility] ORS;  Service: Neurosurgery;  Laterality: Left;  . LOWER EXTREMITY ANGIOGRAPHY Right 05/19/2017   Procedure: LOWER EXTREMITY ANGIOGRAPHY;  Surgeon: Algernon Huxley, MD;  Location: [place] CV LAB;  Service: Cardiovascular;  Laterality: Right;  . LOWER EXTREMITY ANGIOGRAPHY Left 08/10/2018   Procedure: LOWER EXTREMITY ANGIOGRAPHY;  Surgeon: Algernon Huxley, MD;  Location: [place] CV LAB;  Service: Cardiovascular;  Laterality: Left;    FAMILY HISTORY :   Family History  Problem Relation Age of Onset  . Hypertension Sister     SOCIAL HISTORY:   Social History   Tobacco Use  . Smoking status: Current Every Day Smoker    Packs/day: 0.50    Years:  47.00    Pack years: 23.50    Types: Cigarettes  . Smokeless tobacco: Never Used  Vaping Use  . Vaping Use: Never used  Substance Use Topics  . Alcohol use: Yes    Alcohol/week: 1.0 standard drink    Types: 1 Glasses of wine per week    Comment:  almost none in last 6 months  . Drug use: No    ALLERGIES:  has No Known Allergies.  MEDICATIONS:  Current Outpatient Medications  Medication Sig Dispense Refill  . AMBULATORY NON FORMULARY MEDICATION Medication Name: Intrathecal pump Bupivacaine 20.0 mg/ml Fentanyl 2,000.0 mcg/ml Dose 1846.4 mcg/day    . atorvastatin (LIPITOR) 10 MG tablet Take 1 tablet (10 mg total) by mouth daily. 30 tablet 11  . clopidogrel (PLAVIX) 75 MG tablet Take 75 mg by mouth daily.     . Ensure (ENSURE) Take 237 mLs by mouth 3 (three) times daily between meals.     . folic acid (FOLVITE) 1 MG tablet Take 1 tablet (1 mg total) by mouth daily. 90 tablet 1  . gabapentin (NEURONTIN) 300 MG capsule Take 3 capsules (900 mg total) by mouth at bedtime AND 1 capsule (300 mg total) 2 (two) times daily. 150 capsule 2  . ibrutinib 420 MG TABS Take 420 mg by mouth daily. 28 tablet 4  . metFORMIN (GLUCOPHAGE) 1000 MG tablet Take 1 tablet (1,000 mg total) by mouth daily with breakfast. 30 tablet 3  . methocarbamol (ROBAXIN) 500 MG tablet Take 1 tablet (500 mg total) by mouth every 6 (six) hours as needed for muscle  spasms. 80 tablet 0  . mirtazapine (REMERON) 30 MG tablet Take 1 tablet (30 mg total) by mouth at bedtime. 30 tablet 3  . naloxegol oxalate (MOVANTIK) 25 MG TABS tablet Take 1 tablet (25 mg total) by mouth daily. 30 tablet 0  . naloxone (NARCAN) 2 MG/2ML injection Inject 1 mL (1 mg total) into the muscle as needed for up to 2 doses (for opioid overdose). In case of emergency (overdose), inject into muscle of upper arm or leg and call 911. 2 mL 0  . Oxycodone HCl 10 MG TABS Take 1 tablet (10 mg total) by mouth every 4 (four) hours as needed. Must last 30 days 180  tablet 0  . pantoprazole (PROTONIX) 40 MG tablet Take 40 mg by mouth at bedtime.    . Oxycodone HCl 10 MG TABS Take 1 tablet (10 mg total) by mouth every 4 (four) hours as needed. Must last 30 days 180 tablet 0   No current facility-administered medications for this visit.    PHYSICAL EXAMINATION: ECOG PERFORMANCE STATUS: 1 - Symptomatic but completely ambulatory  BP 139/64   Pulse 86   Temp 98 F (36.7 C) (Tympanic)   Resp 16   Wt 140 lb 9.6 oz (63.8 kg)   BMI 21.38 kg/m   Filed Weights   09/20/20 0948  Weight: 140 lb 9.6 oz (63.8 kg)    Physical Exam Constitutional:      Comments: He is alone.  Appears pale.  HENT:     Head: Normocephalic and atraumatic.     Mouth/Throat:     Pharynx: No oropharyngeal exudate.  Eyes:     Pupils: Pupils are equal, round, and reactive to light.  Cardiovascular:     Rate and Rhythm: Normal rate and regular rhythm.  Pulmonary:     Effort: No respiratory distress.     Breath sounds: No wheezing.  Abdominal:     General: Bowel sounds are normal. There is no distension.     Palpations: Abdomen is soft. There is no mass.     Tenderness: There is no abdominal tenderness. There is no guarding or rebound.  Musculoskeletal:        General: No tenderness. Normal range of motion.     Cervical back: Normal range of motion and neck supple.  Skin:    General: Skin is warm.     Coloration: Skin is pale.     Comments: Multiple bruises noted in bilateral upper extremity.  Neurological:     Mental Status: He is alert and oriented to person, place, and time.  Psychiatric:        Mood and Affect: Affect normal.       LABORATORY DATA:  I have reviewed the data as listed    Component Value Date/Time   NA 146 (H) 08/30/2020 0843   NA 142 05/03/2014 1139   K 4.3 08/30/2020 0843   K 4.7 05/03/2014 1139   CL 107 08/30/2020 0843   CL 110 (H) 05/03/2014 1139   CO2 26 08/30/2020 0843   CO2 24 05/03/2014 1139   GLUCOSE 93 08/30/2020 0843    GLUCOSE 98 05/03/2014 1139   BUN 24 (H) 08/30/2020 0843   BUN 20 (H) 05/03/2014 1139   CREATININE 1.31 (H) 08/30/2020 0843   CREATININE 1.22 08/09/2014 1122   CALCIUM 9.0 08/30/2020 0843   CALCIUM 8.6 05/03/2014 1139   PROT 7.4 07/18/2020 1045   PROT 7.5 05/03/2014 1139   ALBUMIN 4.8 07/18/2020 1045  ALBUMIN 4.1 05/03/2014 1139   AST 12 (L) 07/18/2020 1045   AST 15 05/03/2014 1139   ALT 9 07/18/2020 1045   ALT 26 05/03/2014 1139   ALKPHOS 77 07/18/2020 1045   ALKPHOS 94 05/03/2014 1139   BILITOT 1.0 07/18/2020 1045   BILITOT 0.5 05/03/2014 1139   GFRNONAA 58 (L) 08/30/2020 0843   GFRNONAA >60 08/09/2014 1122   GFRAA 55 (L) 01/24/2020 1156   GFRAA >60 08/09/2014 1122    No results found for: SPEP, UPEP  Lab Results  Component Value Date   WBC 12.7 (H) 09/20/2020   NEUTROABS 1.9 09/20/2020   HGB 7.0 (L) 09/20/2020   HCT 21.7 (L) 09/20/2020   MCV 102.4 (H) 09/20/2020   PLT 142 (L) 09/20/2020      Chemistry      Component Value Date/Time   NA 146 (H) 08/30/2020 0843   NA 142 05/03/2014 1139   K 4.3 08/30/2020 0843   K 4.7 05/03/2014 1139   CL 107 08/30/2020 0843   CL 110 (H) 05/03/2014 1139   CO2 26 08/30/2020 0843   CO2 24 05/03/2014 1139   BUN 24 (H) 08/30/2020 0843   BUN 20 (H) 05/03/2014 1139   CREATININE 1.31 (H) 08/30/2020 0843   CREATININE 1.22 08/09/2014 1122      Component Value Date/Time   CALCIUM 9.0 08/30/2020 0843   CALCIUM 8.6 05/03/2014 1139   ALKPHOS 77 07/18/2020 1045   ALKPHOS 94 05/03/2014 1139   AST 12 (L) 07/18/2020 1045   AST 15 05/03/2014 1139   ALT 9 07/18/2020 1045   ALT 26 05/03/2014 1139   BILITOT 1.0 07/18/2020 1045   BILITOT 0.5 05/03/2014 1139       RADIOGRAPHIC STUDIES: I have personally reviewed the radiological images as listed and agreed with the findings in the report. No results found.   ASSESSMENT & PLAN:  CLL (chronic lymphocytic leukemia) ([place]) # CLL/SLL- relapsed most recently s/p Gazyva x6 cycles [AUG  2021] 15th MARCH 2022- No evidence of active lymphoma by FDG PET scan; Stable mild mediastinal adenopathy and moderate periaortic retroperitoneal adenopathy. No significant metabolic activity in these nodes; HOWEVER BONE MARROW BIOPSY- [April 2022]- RECURRENT/RELPASED CLL;CLL FISH- 11 q/17p DELETION.   # on IBRUTINIB 420 mg/day tolerating fairly well; except for anemia [see below]  #Severe anemia likely secondary to CLL-/CKD -hemoglobin 7.0; proceed with 1 PRBC transfusion today.   #Chronic abdominal pain-unclear etiology- STABLE; currently being titrated ; on pain pump as per pain management.   # weight loss/depression;-STABLE continue  remeron 30 mg qhs.   #Hyperkalemia-5.1; STABLE today- /CKD-III  [baseline 1.4; Dr.Lateef]; STABLE; continue LoKelma as per Nephrology.   # DISPOSITION: # 1 unit PRBC today  # follow up in 2 weeks- MD;lab- cbc/CMP;LDH-/hold tube- 1unit PRBC -Dr.B    Orders Placed This Encounter  Procedures  . Comprehensive metabolic panel    Standing Status:   Future    Standing Expiration Date:   09/20/2021  . Lactate dehydrogenase    Standing Status:   Future    Standing Expiration Date:   09/20/2021  . Informed Consent Details: Physician/Practitioner Attestation; Transcribe to consent form and obtain patient signature    Standing Status:   Future    Number of Occurrences:   1    Standing Expiration Date:   09/20/2021    Order Specific Question:   Physician/Practitioner attestation of informed consent for blood and or blood product transfusion    Answer:   I, the physician/practitioner,  attest that I have discussed with the patient the benefits, risks, side effects, alternatives, likelihood of achieving goals and potential problems during recovery for the procedure that I have provided informed consent.    Order Specific Question:   Product(s)    Answer:   All Product(s)  . Care order/instruction    Transfuse Parameters    Standing Status:   Future    Standing  Expiration Date:   09/20/2021  . Type and screen         Standing Status:   Future    Number of Occurrences:   1    Standing Expiration Date:   09/20/2021   All questions were answered. The patient knows to call the clinic with any problems, questions or concerns.      Cammie Sickle, MD 09/20/2020 1:22 PM

## 2020-09-20 NOTE — Progress Notes (Signed)
Patient here for pre treatment check. He is having some abdominal pain today and is under the care of a pain doctor.

## 2020-09-20 NOTE — Progress Notes (Signed)
Nutrition Follow-up:  Patient with stage IV CLL.  Patient on ibrutinib.  Patient has pain pump for abdominal pain.    Met with patient during infusion.  Patient reports that appetite is poor.  Has been trying to drink 2 ensure shakes per day. Having a hard time finding the 350 calorie shakes.      Medications: remeron  Labs: reviewed  Anthropometrics:   Weight 140 lb today  140 lb on 4/26 138 lb on 3/31 145 lb on 11/15 162 lb on 10/6   NUTRITION DIAGNOSIS: Inadequate oral intake continues   INTERVENTION:  Complimentary case of ensure plus given to patient today.  Sample of ensure complete given and patient to drink during infusion. Did not eat breakfast before coming. Increase ensure plus to TID if able    MONITORING, EVALUATION, GOAL: weight trends, intake   NEXT VISIT: Wednesday, June 8 during infusion  James [name], [place], [place] Registered Dietitian [phone] (mobile)

## 2020-09-20 NOTE — Assessment & Plan Note (Addendum)
#  CLL/SLL- relapsed most recently s/p Gazyva x6 cycles [AUG 2021] 15th MARCH 2022- No evidence of active lymphoma by FDG PET scan; Stable mild mediastinal adenopathy and moderate periaortic retroperitoneal adenopathy. No significant metabolic activity in these nodes; HOWEVER BONE MARROW BIOPSY- [April 2022]- RECURRENT/RELPASED CLL;CLL FISH- 11 q/17p DELETION.   # on IBRUTINIB 420 mg/day tolerating fairly well; except for anemia [see below]  #Severe anemia likely secondary to CLL-/CKD -hemoglobin 7.0; proceed with 1 PRBC transfusion today.   #Chronic abdominal pain-unclear etiology- STABLE; currently being titrated ; on pain pump as per pain management.   # weight loss/depression;-STABLE continue  remeron 30 mg qhs.   #Hyperkalemia-5.1; STABLE today- /CKD-III  [baseline 1.4; Dr.Lateef]; STABLE; continue LoKelma as per Nephrology.   # DISPOSITION: # 1 unit PRBC today  # follow up in 2 weeks- MD;lab- cbc/CMP;LDH-/hold tube- 1unit PRBC -Dr.B

## 2020-09-20 NOTE — Patient Instructions (Signed)
[facility]  Discharge Instructions: Thank you for choosing [facility] to provide your oncology and hematology care.  If you have a lab appointment with the Cancer Center, please go directly to the Cancer Center and check in at the registration area.  Wear comfortable clothing and clothing appropriate for easy access to any Portacath or PICC line.   We strive to give you quality time with your provider. You may need to reschedule your appointment if you arrive late (15 or more minutes).  Arriving late affects you and other patients whose appointments are after yours.  Also, if you miss three or more appointments without notifying the office, you may be dismissed from the clinic at the provider's discretion.      For prescription refill requests, have your pharmacy contact our office and allow 72 hours for refills to be completed.    Today you received the following : Blood transfusion   To help prevent nausea and vomiting after your treatment, we encourage you to take your nausea medication as directed.  BELOW ARE SYMPTOMS THAT SHOULD BE REPORTED IMMEDIATELY: *FEVER GREATER THAN 100.4 F (38 C) OR HIGHER *CHILLS OR SWEATING *NAUSEA AND VOMITING THAT IS NOT CONTROLLED WITH YOUR NAUSEA MEDICATION *UNUSUAL SHORTNESS OF BREATH *UNUSUAL BRUISING OR BLEEDING *URINARY PROBLEMS (pain or burning when urinating, or frequent urination) *BOWEL PROBLEMS (unusual diarrhea, constipation, pain near the anus) TENDERNESS IN MOUTH AND THROAT WITH OR WITHOUT PRESENCE OF ULCERS (sore throat, sores in mouth, or a toothache) UNUSUAL RASH, SWELLING OR PAIN  UNUSUAL VAGINAL DISCHARGE OR ITCHING   Items with * indicate a potential emergency and should be followed up as soon as possible or go to the Emergency Department if any problems should occur.  Please show the CHEMOTHERAPY ALERT CARD or IMMUNOTHERAPY ALERT CARD at check-in to the Emergency Department and  triage nurse.  Should you have questions after your visit or need to cancel or reschedule your appointment, please contact [facility]  [phone] and follow the prompts.  Office hours are 8:00 a.m. to 4:30 p.m. Monday - Friday. Please note that voicemails left after 4:00 p.m. may not be returned until the following business day.  We are closed weekends and major holidays. You have access to a nurse at all times for urgent questions. Please call the main number to the clinic [phone] and follow the prompts.  For any non-urgent questions, you may also contact your provider using MyChart. We now offer e-Visits for anyone 18 and older to request care online for non-urgent symptoms. For details visit [email or web address].   Also download the MyChart app! Go to the app store, search "MyChart", open the app, select [facility], and log in with your MyChart username and password.  Due to Covid, a mask is required upon entering the hospital/clinic. If you do not have a mask, one will be given to you upon arrival. For doctor visits, patients may have 1 support person aged 18 or older with them. For treatment visits, patients cannot have anyone with them due to current Covid guidelines and our immunocompromised population.  

## 2020-09-21 LAB — TYPE AND SCREEN
ABO/RH(D): A POS
Antibody Screen: NEGATIVE
Unit division: 0

## 2020-09-21 LAB — BPAM RBC
Blood Product Expiration Date: 202206102359
ISSUE DATE / TIME: 202205251141
Unit Type and Rh: 600

## 2020-09-27 ENCOUNTER — Other Ambulatory Visit: Payer: Self-pay

## 2020-09-27 ENCOUNTER — Other Ambulatory Visit (HOSPITAL_COMMUNITY): Payer: Self-pay

## 2020-09-27 ENCOUNTER — Inpatient Hospital Stay: Payer: Medicare Other | Attending: Hospice and Palliative Medicine | Admitting: Hospice and Palliative Medicine

## 2020-09-27 DIAGNOSIS — D649 Anemia, unspecified: Secondary | ICD-10-CM

## 2020-09-27 DIAGNOSIS — Z515 Encounter for palliative care: Secondary | ICD-10-CM

## 2020-09-27 DIAGNOSIS — C9110 Chronic lymphocytic leukemia of B-cell type not having achieved remission: Secondary | ICD-10-CM | POA: Insufficient documentation

## 2020-09-27 DIAGNOSIS — D6489 Other specified anemias: Secondary | ICD-10-CM | POA: Insufficient documentation

## 2020-09-27 NOTE — Progress Notes (Signed)
PROVIDER NOTE: Information contained herein reflects review and annotations entered in association with encounter. Interpretation of such information and data should be left to medically-trained personnel. Information provided to patient can be located elsewhere in the medical record under "Patient Instructions". Document created using STT-dictation technology, any transcriptional errors that may result from process are unintentional.    Patient: James [name]  Service Category: Procedure  Provider: Gaspar Cola, MD  DOB: 10-26-1950  DOS: 09/28/2020  Location: [facility]  MRN: [id number]  Setting: Ambulatory - outpatient  Referring Provider: [name], MD  Type: Established Patient  Specialty: Interventional Pain Management  PCP: [name], MD   Primary Reason for Visit: Interventional Pain Management Treatment. CC: Abdominal Pain (Bilateral upper quadrant )  Procedure:          Intrathecal Drug Delivery System (IDDS):  Type: Reservoir Refill [phone])       Region: Abdominal Laterality: Left  Type of Pump: Medtronic Synchromed II Delivery Route: Intrathecal Type of Pain Treated: Neuropathic/Nociceptive Primary Medication Class: Opioid/opiate  Medication, Concentration, Infusion Program, & Delivery Rate: Please see scanned programming printout.   Indications: 1. Chronic pain syndrome   2. Chronic epigastric pain (1ry area of Pain)   3. Chronic abdominal pain   4. Cancer-related pain   5. CLL (chronic lymphocytic leukemia) ([place])   6. Malignant lymphoma, small lymphocytic ([facility])   7. Abdominal wall pain in epigastric region   8. Non-traumatic compression fracture of T2 thoracic vertebra, sequela   9. Non-traumatic compression fracture of T1 thoracic vertebra, sequela   10. Presence of implanted infusion pump (Medtronic)   11. Presence of intrathecal pump (Medtronic)   12. Pharmacologic therapy   13. Encounter for adjustment and management of  infusion pump   14. Therapeutic opioid-induced constipation (OIC)   15. Chronic use of opiate for therapeutic purpose    Pain Assessment: Self-Reported Pain Score: 6 /10             Reported level is compatible with observation.        RTCB: 01/07/2021 Nonopioids transferred on 06/27/2020: Gabapentin  Pharmacotherapy Assessment  Analgesic: Oxycodone IR 83m, 1 tab PO q 4 hrs (60 mg/day of oxycodone IR) (90 MME) Highest recorded MME/day: 150 mg/day 90 day average MME/day: 632.27 mg/day, according to PMP   Monitoring: [place] PMP: PDMP reviewed during this encounter.       Pharmacotherapy: No side-effects or adverse reactions reported. Compliance: No problems identified. Effectiveness: Clinically acceptable. Plan: Refer to "POC".  UDS: No results found for: SUMMARY  Intrathecal Pump Therapy Assessment  Manufacturer: Medtronic Synchromed Type: Programmable Volume: 40 mL reservoir MRI compatibility: Yes   Drug content:  Primary Medication Class:Opioid Primary Medication:PF-Fentanyl(2000 mcg/mL) Secondary Medication:PF-Bupivacaine(20 mg/mL) Other Medication:None  PTM(PCA) mode: PTM dose:25 mcg bolus Duration of bolus:1 minute Lockout interval:4hours Maximum 24-hour doses:6   Programming:  Type: Simple continuous. See pump readout for details.   Changes:  Medication Change: None at this point Rate Change: No change in rate  Reported side-effects or adverse reactions: None reported  Effectiveness: Described as relatively effective, allowing for increase in activities of daily living (ADL) Clinically meaningful improvement in function (CMIF): Sustained CMIF goals met  Plan: Pump refill today  Pre-op H&P Assessment:  Mr. JDoucetis a 70y.o. (year old), male patient, seen today for interventional treatment. He  has a past surgical history that includes Cholecystectomy (1983); Esophagogastroduodenoscopy (egd) with propofol (N/A, 11/20/2016); Cataract extraction w/  intraocular lens implant (Bilateral); Lower Extremity  Angiography (Right, 05/19/2017); Esophagogastroduodenoscopy (egd) with propofol (N/A, 10/27/2017); Colonoscopy with propofol (N/A, 10/27/2017); Lower Extremity Angiography (Left, 08/10/2018); and Intrathecal pump implant (Left, 02/07/2020). Mr. Aja has a current medication list which includes the following prescription(s): AMBULATORY NON FORMULARY MEDICATION, atorvastatin, clopidogrel, ensure, folic acid, gabapentin, ibrutinib, metformin, methocarbamol, mirtazapine, naloxone, oxycodone hcl, pantoprazole, naloxegol oxalate, [START ON 10/09/2020] oxycodone hcl, [START ON 11/08/2020] oxycodone hcl, and [START ON 12/08/2020] oxycodone hcl. His primarily concern today is the Abdominal Pain (Bilateral upper quadrant )  Initial Vital Signs:  Pulse/HCG Rate: 80  Temp: (!) 97.3 F (36.3 C) Resp: 16 BP: (!) 149/54 SpO2: 100 %  BMI: Estimated body mass index is 21.29 kg/m as calculated from the following:   Height as of this encounter: _0  (1.727 m).   Weight as of this encounter: 140 lb (63.5 kg).  Risk Assessment: Allergies: Reviewed. He has No Known Allergies.  Allergy Precautions: None required Coagulopathies: Reviewed. None identified.  Blood-thinner therapy: None at this time Active Infection(s): Reviewed. None identified. Mr. Eves is afebrile  Site Confirmation: Mr. Lucken was asked to confirm the procedure and laterality before marking the site Procedure checklist: Completed Consent: Before the procedure and under the influence of no sedative(s), amnesic(s), or anxiolytics, the patient was informed of the treatment options, risks and possible complications. To fulfill our ethical and legal obligations, as recommended by the American Medical Association's Code of Ethics, I have informed the patient of my clinical impression; the nature and purpose of the treatment or procedure; the risks, benefits, and possible complications of the intervention;  the alternatives, including doing nothing; the risk(s) and benefit(s) of the alternative treatment(s) or procedure(s); and the risk(s) and benefit(s) of doing nothing.  Mr. Bala was provided with information about the general risks and possible complications associated with most interventional procedures. These include, but are not limited to: failure to achieve desired goals, infection, bleeding, organ or nerve damage, allergic reactions, paralysis, and/or death.  In addition, he was informed of those risks and possible complications associated to this particular procedure, which include, but are not limited to: damage to the implant; failure to decrease pain; local, systemic, or serious CNS infections, intraspinal abscess with possible cord compression and paralysis, or life-threatening such as meningitis; bleeding; organ damage; nerve injury or damage with subsequent sensory, motor, and/or autonomic system dysfunction, resulting in transient or permanent pain, numbness, and/or weakness of one or several areas of the body; allergic reactions, either minor or major life-threatening, such as anaphylactic or anaphylactoid reactions.  Furthermore, Mr. Trebilcock was informed of those risks and complications associated with the medications. These include, but are not limited to: allergic reactions (i.e.: anaphylactic or anaphylactoid reactions); endorphine suppression; bradycardia and/or hypotension; water retention and/or peripheral vascular relaxation leading to lower extremity edema and possible stasis ulcers; respiratory depression and/or shortness of breath; decreased metabolic rate leading to weight gain; swelling or edema; medication-induced neural toxicity; particulate matter embolism and blood vessel occlusion with resultant organ, and/or nervous system infarction; and/or intrathecal granuloma formation with possible spinal cord compression and permanent paralysis.  Before refilling the pump Mr. Anguiano was  informed that some of the medications used in the devise may not be FDA approved for such use and therefore it constitutes an off-label use of the medications.  Finally, he was informed that Medicine is not an exact science; therefore, there is also the possibility of unforeseen or unpredictable risks and/or possible complications that may result in a catastrophic outcome. The patient indicated having understood  very clearly. We have given the patient no guarantees and we have made no promises. Enough time was given to the patient to ask questions, all of which were answered to the patient's satisfaction. Mr. Hettich has indicated that he wanted to continue with the procedure. Attestation: I, the ordering provider, attest that I have discussed with the patient the benefits, risks, side-effects, alternatives, likelihood of achieving goals, and potential problems during recovery for the procedure that I have provided informed consent. Date  Time: 09/28/2020  1:30 PM  Pre-Procedure Preparation:  Monitoring: As per clinic protocol. Respiration, ETCO2, SpO2, BP, heart rate and rhythm monitor placed and checked for adequate function Safety Precautions: Patient was assessed for positional comfort and pressure points before starting the procedure. Time-out: I initiated and conducted the "Time-out" before starting the procedure, as per protocol. The patient was asked to participate by confirming the accuracy of the "Time Out" information. Verification of the correct person, site, and procedure were performed and confirmed by me, the nursing staff, and the patient. "Time-out" conducted as per Joint Commission's Universal Protocol (UP.01.01.01). Time: 1351  Description of Procedure:          Position: Supine Target Area: Central-port of intrathecal pump. Approach: Anterior, 90 degree angle approach. Area Prepped: Entire Area around the pump implant. DuraPrep (Iodine Povacrylex [0.7% available iodine] and  Isopropyl Alcohol, 74% w/w) Safety Precautions: Aspiration looking for blood return was conducted prior to all injections. At no point did we inject any substances, as a needle was being advanced. No attempts were made at seeking any paresthesias. Safe injection practices and needle disposal techniques used. Medications properly checked for expiration dates. SDV (single dose vial) medications used. Description of the Procedure: Protocol guidelines were followed. Two nurses trained to do implant refills were present during the entire procedure. The refill medication was checked by both healthcare providers as well as the patient. The patient was included in the "Time-out" to verify the medication. The patient was placed in position. The pump was identified. The area was prepped in the usual manner. The sterile template was positioned over the pump, making sure the side-port location matched that of the pump. Both, the pump and the template were held for stability. The needle provided in the Medtronic Kit was then introduced thru the center of the template and into the central port. The pump content was aspirated and discarded volume documented. The new medication was slowly infused into the pump, thru the filter, making sure to avoid overpressure of the device. The needle was then removed and the area cleansed, making sure to leave some of the prepping solution back to take advantage of its long term bactericidal properties. The pump was interrogated and programmed to reflect the correct medication, volume, and dosage. The program was printed and taken to the physician for approval. Once checked and signed by the physician, a copy was provided to the patient and another scanned into the EMR. Vitals:   09/28/20 1328  BP: (!) 149/54  Pulse: 80  Resp: 16  Temp: (!) 97.3 F (36.3 C)  TempSrc: Temporal  SpO2: 100%  Weight: 140 lb (63.5 kg)  Height: _0  (1.727 m)    Start Time: 1355 hrs. End Time: 1404  hrs. Materials & Medications: Medtronic Refill Kit Medication(s): Please see chart orders for details.  Imaging Guidance:          Type of Imaging Technique: None used Indication(s): N/A Exposure Time: No patient exposure Contrast: None used. Fluoroscopic Guidance:  N/A Ultrasound Guidance: N/A Interpretation: N/A  Antibiotic Prophylaxis:   Anti-infectives (From admission, onward)   None     Indication(s): None identified  Post-operative Assessment:  Post-procedure Vital Signs:  Pulse/HCG Rate: 80  Temp: (!) 97.3 F (36.3 C) Resp: 16 BP: (!) 149/54 SpO2: 100 %  EBL: None  Complications: No immediate post-treatment complications observed by team, or reported by patient.  Note: The patient tolerated the entire procedure well. A repeat set of vitals were taken after the procedure and the patient was kept under observation following institutional policy, for this type of procedure. Post-procedural neurological assessment was performed, showing return to baseline, prior to discharge. The patient was provided with post-procedure discharge instructions, including a section on how to identify potential problems. Should any problems arise concerning this procedure, the patient was given instructions to immediately contact us, at any time, without hesitation. In any case, we plan to contact the patient by telephone for a follow-up status report regarding this interventional procedure.  Comments:  No additional relevant information.  Plan of Care  Orders:  No orders of the defined types were placed in this encounter.  Chronic Opioid Analgesic:  Oxycodone IR $RemoveBefo'10mg'QRjiyeVcadI$ , 1 tab PO q 4 hrs (60 mg/day of oxycodone IR) (90 MME) Highest recorded MME/day: 150 mg/day 90 day average MME/day: 632.27 mg/day, according to PMP   Medications ordered for procedure: Meds ordered this encounter  Medications  . Oxycodone HCl 10 MG TABS    Sig: Take 1 tablet (10 mg total) by mouth every 4 (four) hours as  needed. Must last 30 days    Dispense:  180 tablet    Refill:  0    Chronic Pain: STOP Act (Not applicable) Fill 1 day early if closed on refill date. Avoid benzodiazepines within 8 hours of opioids  . Oxycodone HCl 10 MG TABS    Sig: Take 1 tablet (10 mg total) by mouth every 4 (four) hours as needed. Must last 30 days    Dispense:  180 tablet    Refill:  0    Chronic Pain: STOP Act (Not applicable) Fill 1 day early if closed on refill date. Avoid benzodiazepines within 8 hours of opioids  . Oxycodone HCl 10 MG TABS    Sig: Take 1 tablet (10 mg total) by mouth every 4 (four) hours as needed. Must last 30 days    Dispense:  180 tablet    Refill:  0    Chronic Pain: STOP Act (Not applicable) Fill 1 day early if closed on refill date. Avoid benzodiazepines within 8 hours of opioids  . naloxegol oxalate (MOVANTIK) 25 MG TABS tablet    Sig: Take 1 tablet (25 mg total) by mouth daily.    Dispense:  30 tablet    Refill:  2    Fill one day early if pharmacy is closed on scheduled refill date. Generic permitted. Do not send renewal requests. Void any older duplicate prescription or refill(s) that may be on file.   Medications administered: Livan F. Edgar had no medications administered during this visit.  See the medical record for exact dosing, route, and time of administration.  Follow-up plan:   Return for Pump Refill (Max:80mo).       Interventional Therapies  Risk  Complexity Considerations:   NOTE: PLAVIX ANTICOAGULATION  (Stop: 7 days  Restart: 2 hrs)   Planned  Pending:   Therapeutic bilateral splanchnic nerves neurolysis #1 with alcohol  Diagnostic intrathecal catheter test under fluoroscopic guidance  to determine location    Under consideration:   Possible revision of intrathecal catheter    Completed:   Diagnostic bilateral celiac plexus block x2 (06/01/2019; 11/02/2019) (50/30/30/0) (75/75/0/0)  Therapeutic bilateral celiac plexus neurolysis with phenol x1 (04/06/2020)   Diagnostic bilateral rectus abdominis trigger point injection x1 (04/06/2020) (0/0/0)  Diagnostic (ML) T12-L1 intrathecal injection of PF-fentanyl 25 mcg as an intrathecal pump trial x1 (12/14/2019)   Therapeutic  Palliative (PRN) options:   Intrathecal pump management       Recent Visits Date Type Provider Dept  08/29/20 Procedure visit Milinda Pointer, MD [facility]  07/27/20 Procedure visit Milinda Pointer, MD [facility]  Showing recent visits within past 90 days and meeting all other requirements Today's Visits Date Type Provider Dept  09/28/20 Procedure visit Milinda Pointer, MD [facility]  Showing today's visits and meeting all other requirements Future Appointments Date Type Provider Dept  11/01/20 Appointment Milinda Pointer, MD [facility]  Showing future appointments within next 90 days and meeting all other requirements  Disposition: Discharge home  Discharge (Date  Time): 09/28/2020; 1429 hrs.   Primary Care Physician: [name], MD Location: [facility] Note by: Gaspar Cola, MD Date: 09/28/2020; Time: 2:34 PM  Disclaimer:  Medicine is not an Chief Strategy Officer. The only guarantee in medicine is that nothing is guaranteed. It is important to note that the decision to proceed with this intervention was based on the information collected from the patient. The Data and conclusions were drawn from the patient's questionnaire, the interview, and the physical examination. Because the information was provided in large part by the patient, it cannot be guaranteed that it has not been purposely or unconsciously manipulated. Every effort has been made to obtain as much relevant data as possible for this evaluation. It is important to note that the conclusions that lead to this procedure are derived in large part from the available data. Always take into account that the treatment will also be  dependent on availability of resources and existing treatment guidelines, considered by other Pain Management Practitioners as being common knowledge and practice, at the time of the intervention. For Medico-Legal purposes, it is also important to point out that variation in procedural techniques and pharmacological choices are the acceptable norm. The indications, contraindications, technique, and results of the above procedure should only be interpreted and judged by a Board-Certified Interventional Pain Specialist with extensive familiarity and expertise in the same exact procedure and technique.

## 2020-09-27 NOTE — Progress Notes (Signed)
Was unable to reach patient for scheduled virtual visit.  Unable to leave voicemail.  Will reschedule.

## 2020-09-28 ENCOUNTER — Ambulatory Visit: Payer: Medicare Other | Attending: Pain Medicine | Admitting: Pain Medicine

## 2020-09-28 ENCOUNTER — Other Ambulatory Visit: Payer: Self-pay

## 2020-09-28 ENCOUNTER — Encounter: Payer: Self-pay | Admitting: Pain Medicine

## 2020-09-28 VITALS — BP 149/54 | HR 80 | Temp 97.3°F | Resp 16 | Ht 68.0 in | Wt 140.0 lb

## 2020-09-28 DIAGNOSIS — Y939 Activity, unspecified: Secondary | ICD-10-CM | POA: Insufficient documentation

## 2020-09-28 DIAGNOSIS — R1012 Left upper quadrant pain: Secondary | ICD-10-CM | POA: Insufficient documentation

## 2020-09-28 DIAGNOSIS — Z79891 Long term (current) use of opiate analgesic: Secondary | ICD-10-CM

## 2020-09-28 DIAGNOSIS — C8300 Small cell B-cell lymphoma, unspecified site: Secondary | ICD-10-CM

## 2020-09-28 DIAGNOSIS — C9110 Chronic lymphocytic leukemia of B-cell type not having achieved remission: Secondary | ICD-10-CM

## 2020-09-28 DIAGNOSIS — R1011 Right upper quadrant pain: Secondary | ICD-10-CM | POA: Diagnosis not present

## 2020-09-28 DIAGNOSIS — G894 Chronic pain syndrome: Secondary | ICD-10-CM | POA: Diagnosis not present

## 2020-09-28 DIAGNOSIS — S22029S Unspecified fracture of second thoracic vertebra, sequela: Secondary | ICD-10-CM | POA: Diagnosis not present

## 2020-09-28 DIAGNOSIS — G8929 Other chronic pain: Secondary | ICD-10-CM

## 2020-09-28 DIAGNOSIS — K5900 Constipation, unspecified: Secondary | ICD-10-CM | POA: Diagnosis not present

## 2020-09-28 DIAGNOSIS — S22019S Unspecified fracture of first thoracic vertebra, sequela: Secondary | ICD-10-CM | POA: Insufficient documentation

## 2020-09-28 DIAGNOSIS — Z7902 Long term (current) use of antithrombotics/antiplatelets: Secondary | ICD-10-CM | POA: Diagnosis not present

## 2020-09-28 DIAGNOSIS — Z451 Encounter for adjustment and management of infusion pump: Secondary | ICD-10-CM

## 2020-09-28 DIAGNOSIS — G893 Neoplasm related pain (acute) (chronic): Secondary | ICD-10-CM | POA: Diagnosis not present

## 2020-09-28 DIAGNOSIS — M4854XS Collapsed vertebra, not elsewhere classified, thoracic region, sequela of fracture: Secondary | ICD-10-CM

## 2020-09-28 DIAGNOSIS — R1013 Epigastric pain: Secondary | ICD-10-CM

## 2020-09-28 DIAGNOSIS — K5903 Drug induced constipation: Secondary | ICD-10-CM | POA: Insufficient documentation

## 2020-09-28 DIAGNOSIS — T402X5A Adverse effect of other opioids, initial encounter: Secondary | ICD-10-CM

## 2020-09-28 DIAGNOSIS — Y929 Unspecified place or not applicable: Secondary | ICD-10-CM | POA: Diagnosis not present

## 2020-09-28 DIAGNOSIS — Z95828 Presence of other vascular implants and grafts: Secondary | ICD-10-CM

## 2020-09-28 DIAGNOSIS — Z978 Presence of other specified devices: Secondary | ICD-10-CM

## 2020-09-28 DIAGNOSIS — Z79899 Other long term (current) drug therapy: Secondary | ICD-10-CM

## 2020-09-28 MED ORDER — OXYCODONE HCL 10 MG PO TABS: 10.0000 mg | ORAL_TABLET | ORAL | 0 refills | Status: DC | PRN

## 2020-09-28 MED ORDER — NALOXEGOL OXALATE 25 MG PO TABS: 25.0000 mg | ORAL_TABLET | Freq: Every day | ORAL | 2 refills | Status: DC

## 2020-09-28 NOTE — Patient Instructions (Signed)
Opioid Overdose Opioids are drugs that are often used to treat pain. Opioids include illegal drugs, such as heroin, as well as prescription pain medicines, such as codeine, morphine, hydrocodone, oxycodone, and fentanyl. An opioid overdose happens when you take too much of an opioid. An overdose may be intentional or accidental and can happen with any type of opioid. The effects of an overdose can be mild, dangerous, or even deadly. Opioid overdose is a medical emergency. What are the causes? This condition may be caused by:  Taking too much of an opioid on purpose.  Taking too much of an opioid by accident.  Using two or more substances that contain opioids at the same time.  Taking an opioid with a substance that affects your heart, breathing, or blood pressure. These include alcohol, tranquilizers, sleeping pills, illegal drugs, and some over-the-counter medicines. This condition may also happen due to an error made by:  A health care provider who prescribes a medicine.  The pharmacist who fills the prescription order. What increases the risk? This condition is more likely in:  Children. They may be attracted to colorful pills. Because of a child's small size, even a small amount of a drug can be dangerous.  Older people. They may be taking many different drugs. Older people may have difficulty reading labels or remembering when they last took their medicine. They may also be more sensitive to the effects of opioids.  People with chronic medical conditions, especially heart, liver, kidney, or neurological diseases.  People who take an opioid for a long period of time.  People who use: ? Illegal drugs. IV heroin is especially dangerous. ? Other substances, including alcohol, while using an opioid.  People who have: ? A history of drug or alcohol abuse. ? Certain mental health conditions. ? A history of previous drug overdoses.  People who take opioids that are not prescribed  for them. What are the signs or symptoms? Symptoms of this condition depend on the type of opioid and the amount that was taken. Common symptoms include:  Sleepiness or difficulty waking from sleep.  Decrease in attention.  Confusion.  Slurred speech.  Slowed breathing and a slow pulse (bradycardia).  Nausea and vomiting.  Abnormally small pupils. Signs and symptoms that require emergency treatment include:  Cold, clammy, and pale skin.  Blue lips and fingernails.  Vomiting.  Gurgling sounds in the throat.  A pulse that is very slow or difficult to detect.  Breathing that is very irregular, slow, noisy, or difficult to detect.  Limp body.  Inability to respond to speech or be awakened from sleep (stupor).  Seizures. How is this diagnosed? This condition is diagnosed based on your symptoms and medical history. It is important to tell your health care provider:  About all of the opioids that you took.  When you took the opioids.  Whether you were drinking alcohol or using marijuana, cocaine, or other drugs. Your health care provider will do a physical exam. This exam may include:  Checking and monitoring your heart rate and rhythm, breathing rate, temperature, and blood pressure (vital signs).  Measuring oxygen levels in your blood.  Checking for abnormally small pupils. You may also have blood tests or urine tests. You may have X-rays if you are having severe breathing problems. How is this treated? This condition requires immediate medical treatment and hospitalization. Treatment is given in the hospital intensive care (ICU) setting. Supporting your vital signs and your breathing is the first step in   treating an opioid overdose. Treatment may also include:  Giving salts and minerals (electrolytes) along with fluids through an IV.  Inserting a breathing tube (endotracheal tube) in your airway to help you breathe if you cannot breathe on your own or you are in  danger of not being able to breathe on your own.  Giving oxygen through a small tube under your nose.  Passing a tube through your nose and into your stomach (nasogastric tube, or NG tube) to empty your stomach.  Giving medicines that: ? Increase your blood pressure. ? Relieve nausea and vomiting. ? Relieve abdominal pain and cramping. ? Reverse the effects of the opioid (naloxone).  Monitoring your heart and oxygen levels.  Ongoing counseling and mental health support if you intentionally overdosed or used an illegal drug. Follow these instructions at home: Medicines  Take over-the-counter and prescription medicines only as told by your health care provider.  Always ask your health care provider about possible side effects and interactions of any new medicine that you start taking.  Keep a list of all the medicines that you take, including over-the-counter medicines. Bring this list with you to all your medical visits. General instructions  Drink enough fluid to keep your urine pale yellow.  Keep all follow-up visits as told by your health care provider. This is important.   How is this prevented?  Read the drug inserts that come with your opioid pain medicines.  Take medicines only as told by your health care provider. Do not take more medicine than you are told. Do not take medicines more frequently than you are told.  Do not drink alcohol or take sedatives when taking opioids.  Do not use illegal or recreational drugs, including cocaine, ecstasy, and marijuana.  Do not take opioid medicines that are not prescribed for you.  Store all medicines in safety containers that are out of the reach of children.  Get help if you are struggling with: ? Alcohol or drug use. ? Depression or another mental health problem. ? Thoughts of hurting yourself or another person.  Keep the phone number of your local poison control center near your phone or in your mobile phone. In the  [place] the hotline of the National Poison Control Center is (800) 222-1222.  If you were prescribed naloxone, make sure you understand how to take it. Contact a health care provider if you:  Need help understanding how to take your pain medicines.  Feel your medicines are too strong.  Are concerned that your pain medicines are not working well for your pain.  Develop new symptoms or side effects when you are taking medicines. Get help right away if:  You or someone else is having symptoms of an opioid overdose. Get help even if you are not sure.  You have serious thoughts about hurting yourself or others.  You have: ? Chest pain. ? Difficulty breathing. ? A loss of consciousness. These symptoms may represent a serious problem that is an emergency. Do not wait to see if the symptoms will go away. Get medical help right away. Call your local emergency services (911 in the [place] Do not drive yourself to the hospital. If you ever feel like you may hurt yourself or others, or have thoughts about taking your own life, get help right away. You can go to your nearest emergency department or call:  Your local emergency services (911 in the [place]  A suicide crisis helpline, such as the National Suicide Prevention Lifeline   at 1-800-273-8255. This is open 24 hours a day. Summary  Opioids are drugs that are often used to treat pain. Opioids include illegal drugs, such as heroin, as well as prescription pain medicines.  An opioid overdose happens when you take too much of an opioid.  Overdoses can be intentional or accidental.  Opioid overdose is very dangerous. It is a life-threatening emergency.  If you or someone you know is experiencing an opioid overdose, get help right away. This information is not intended to replace advice given to you by your health care provider. Make sure you discuss any questions you have with your health care provider. Document Revised: 04/02/2018 Document  Reviewed: 04/02/2018 [employer] Patient Education  2021 [employer]  

## 2020-09-28 NOTE — Progress Notes (Signed)
Safety precautions to be maintained throughout the outpatient stay will include: orient to surroundings, keep bed in low position, maintain call bell within reach at all times, provide assistance with transfer out of bed and ambulation.  

## 2020-09-29 ENCOUNTER — Telehealth: Payer: Self-pay

## 2020-09-29 ENCOUNTER — Other Ambulatory Visit (HOSPITAL_COMMUNITY): Payer: Self-pay

## 2020-09-29 NOTE — Telephone Encounter (Signed)
Post IT pump refill phone call.  Patient states he is doing well.

## 2020-10-02 ENCOUNTER — Other Ambulatory Visit (HOSPITAL_COMMUNITY): Payer: Self-pay

## 2020-10-03 MED FILL — Medication: INTRATHECAL | Qty: 1 | Status: AC

## 2020-10-04 ENCOUNTER — Inpatient Hospital Stay: Payer: Medicare Other

## 2020-10-04 ENCOUNTER — Other Ambulatory Visit: Payer: Self-pay

## 2020-10-04 ENCOUNTER — Other Ambulatory Visit: Payer: Self-pay | Admitting: Oncology

## 2020-10-04 ENCOUNTER — Inpatient Hospital Stay (HOSPITAL_BASED_OUTPATIENT_CLINIC_OR_DEPARTMENT_OTHER): Payer: Medicare Other | Admitting: Oncology

## 2020-10-04 VITALS — BP 154/69 | HR 75 | Temp 97.8°F | Resp 18 | Wt 142.0 lb

## 2020-10-04 DIAGNOSIS — T402X5A Adverse effect of other opioids, initial encounter: Secondary | ICD-10-CM

## 2020-10-04 DIAGNOSIS — K5903 Drug induced constipation: Secondary | ICD-10-CM

## 2020-10-04 DIAGNOSIS — D6489 Other specified anemias: Secondary | ICD-10-CM | POA: Diagnosis not present

## 2020-10-04 DIAGNOSIS — R1013 Epigastric pain: Secondary | ICD-10-CM

## 2020-10-04 DIAGNOSIS — C9110 Chronic lymphocytic leukemia of B-cell type not having achieved remission: Secondary | ICD-10-CM

## 2020-10-04 DIAGNOSIS — G8929 Other chronic pain: Secondary | ICD-10-CM

## 2020-10-04 DIAGNOSIS — D649 Anemia, unspecified: Secondary | ICD-10-CM

## 2020-10-04 LAB — CBC WITH DIFFERENTIAL/PLATELET
Abs Immature Granulocytes: 0 10*3/uL (ref 0.00–0.07)
Band Neutrophils: 0 %
Basophils Absolute: 0 10*3/uL (ref 0.0–0.1)
Basophils Relative: 0 %
Blasts: 0 %
Eosinophils Absolute: 0 10*3/uL (ref 0.0–0.5)
Eosinophils Relative: 0 %
HCT: 19.3 % — ABNORMAL LOW (ref 39.0–52.0)
Hemoglobin: 6.2 g/dL — ABNORMAL LOW (ref 13.0–17.0)
Lymphocytes Relative: 92 %
Lymphs Abs: 14.1 10*3/uL — ABNORMAL HIGH (ref 0.7–4.0)
MCH: 33 pg (ref 26.0–34.0)
MCHC: 32.1 g/dL (ref 30.0–36.0)
MCV: 102.7 fL — ABNORMAL HIGH (ref 80.0–100.0)
Metamyelocytes Relative: 0 %
Monocytes Absolute: 0.5 10*3/uL (ref 0.1–1.0)
Monocytes Relative: 3 %
Myelocytes: 0 %
Neutro Abs: 0.8 10*3/uL — ABNORMAL LOW (ref 1.7–7.7)
Neutrophils Relative %: 5 %
Other: 0 %
Platelets: 141 10*3/uL — ABNORMAL LOW (ref 150–400)
Promyelocytes Relative: 0 %
RBC: 1.88 MIL/uL — ABNORMAL LOW (ref 4.22–5.81)
RDW: 21.4 % — ABNORMAL HIGH (ref 11.5–15.5)
Smear Review: ADEQUATE
WBC: 15.4 10*3/uL — ABNORMAL HIGH (ref 4.0–10.5)
nRBC: 0 % (ref 0.0–0.2)
nRBC: 0 /100 WBC

## 2020-10-04 LAB — COMPREHENSIVE METABOLIC PANEL
ALT: 8 U/L (ref 0–44)
AST: 11 U/L — ABNORMAL LOW (ref 15–41)
Albumin: 4.1 g/dL (ref 3.5–5.0)
Alkaline Phosphatase: 62 U/L (ref 38–126)
Anion gap: 10 (ref 5–15)
BUN: 33 mg/dL — ABNORMAL HIGH (ref 8–23)
CO2: 24 mmol/L (ref 22–32)
Calcium: 9 mg/dL (ref 8.9–10.3)
Chloride: 105 mmol/L (ref 98–111)
Creatinine, Ser: 1.23 mg/dL (ref 0.61–1.24)
GFR, Estimated: >60 mL/min (ref >60)
Glucose, Bld: 103 mg/dL — ABNORMAL HIGH (ref 70–99)
Potassium: 4.4 mmol/L (ref 3.5–5.1)
Sodium: 139 mmol/L (ref 135–145)
Total Bilirubin: 0.8 mg/dL (ref 0.3–1.2)
Total Protein: 6.3 g/dL — ABNORMAL LOW (ref 6.5–8.1)

## 2020-10-04 LAB — LACTATE DEHYDROGENASE: LDH: 104 U/L (ref 98–192)

## 2020-10-04 LAB — SAMPLE TO BLOOD BANK

## 2020-10-04 LAB — PREPARE RBC (CROSSMATCH)

## 2020-10-04 MED ORDER — SODIUM CHLORIDE 0.9% IV SOLUTION
250.0000 mL | Freq: Once | INTRAVENOUS | Status: AC
  Administered 2020-10-04: 250 mL via INTRAVENOUS
  Filled 2020-10-04: qty 250

## 2020-10-04 MED ORDER — ACETAMINOPHEN 325 MG PO TABS
650.0000 mg | ORAL_TABLET | Freq: Once | ORAL | Status: AC
  Administered 2020-10-04: 650 mg via ORAL
  Filled 2020-10-04: qty 2

## 2020-10-04 MED ORDER — DIPHENHYDRAMINE HCL 50 MG/ML IJ SOLN
25.0000 mg | Freq: Once | INTRAMUSCULAR | Status: AC
Start: 2020-10-04 — End: 2020-10-04
  Administered 2020-10-04: 25 mg via INTRAVENOUS
  Filled 2020-10-04: qty 1

## 2020-10-04 MED ORDER — PAIN MANAGEMENT IT PUMP REFILL: 1.0000 | Freq: Once | INTRATHECAL | 0 refills | Status: AC

## 2020-10-04 MED ORDER — METFORMIN HCL 1000 MG PO TABS: 1000.0000 mg | ORAL_TABLET | Freq: Every day | ORAL | 3 refills | Status: AC

## 2020-10-04 NOTE — Progress Notes (Signed)
Patient here for oncology follow-up appointment, expresses concerns of Metformin refill, abdominal pian and Left cheek spot

## 2020-10-04 NOTE — Progress Notes (Signed)
Nutrition Follow-up:  Patient with stage IV CLL.  Patient on ibrutinib.  Patient has pain pump for abdominal pain.   Met with patient today during infusion.  Patient reports that his appetite is about the same, no improvement.  Does not have much of an appetite.   Noted 24 hr recall from NP today.  ~875 calories and 33 g protein  Says that ensure is causing some diarrhea and has back off of drinking them. Pain continues to be issue.    Medications: remeron  Labs: reviewed  Anthropometrics:   Weight 142 lb today  140 lb on 4/26 138 lb on 3/31 145 lb on 11/15 162 lb on 10/6   NUTRITION DIAGNOSIS: Inadequate oral intake stable   INTERVENTION:  Provided samples of [employer] 1.4 shake, Reason shake and [employer] shake 500 calorie to try. Contact information provided    MONITORING, EVALUATION, GOAL: weight trends, intake   NEXT VISIT: Wednesday, June 22 during infusion  Calvin Chura B. Zenia Resides, [place], [place] Registered Dietitian [phone] (mobile)

## 2020-10-04 NOTE — Patient Instructions (Signed)
[facility]    Discharge Instructions: Thank you for choosing [facility] to provide your oncology and hematology care.  If you have a lab appointment with the [facility], please go directly to the [facility] and check in at the registration area.  Wear comfortable clothing and clothing appropriate for easy access to any Portacath or PICC line.   We strive to give you quality time with your provider. You may need to reschedule your appointment if you arrive late (15 or more minutes).  Arriving late affects you and other patients whose appointments are after yours.  Also, if you miss three or more appointments without notifying the office, you may be dismissed from the clinic at the provider's discretion.      For prescription refill requests, have your pharmacy contact our office and allow 72 hours for refills to be completed.    Today you received blood transfusion  Blood Transfusion, Adult, Care After This sheet gives you information about how to care for yourself after your procedure. Your doctor may also give you more specific instructions. If you have problems or questions, contact your doctor. What can I expect after the procedure? After the procedure, it is common to have:  Bruising and soreness at the IV site.  A fever or chills on the day of the procedure. This may be your body's response to the new blood cells received.  A headache. Follow these instructions at home: Insertion site care  Follow instructions from your doctor about how to take care of your insertion site. This is where an IV tube was put into your vein. Make sure you: ? Wash your hands with soap and water before and after you change your bandage (dressing). If you cannot use soap and water, use hand sanitizer. ? Change your bandage as told by your doctor.  Check your insertion site every day for signs of infection. Check for: ? Redness, swelling, or  pain. ? Bleeding from the site. ? Warmth. ? Pus or a bad smell.      General instructions  Take over-the-counter and prescription medicines only as told by your doctor.  Rest as told by your doctor.  Go back to your normal activities as told by your doctor.  Keep all follow-up visits as told by your doctor. This is important. Contact a doctor if:  You have itching or red, swollen areas of skin (hives).  You feel worried or nervous (anxious).  You feel weak after doing your normal activities.  You have redness, swelling, warmth, or pain around the insertion site.  You have blood coming from the insertion site, and the blood does not stop with pressure.  You have pus or a bad smell coming from the insertion site. Get help right away if:  You have signs of a serious reaction. This may be coming from an allergy or the body's defense system (immune system). Signs include: ? Trouble breathing or shortness of breath. ? Swelling of the face or feeling warm (flushed). ? Fever or chills. ? Head, chest, or back pain. ? Dark pee (urine) or blood in the pee. ? Widespread rash. ? Fast heartbeat. ? Feeling dizzy or light-headed. You may receive your blood transfusion in an outpatient setting. If so, you will be told whom to contact to report any reactions. These symptoms may be an emergency. Do not wait to see if the symptoms will go away. Get medical help right away. Call your  local emergency services (911 in the [place] Do not drive yourself to the hospital. Summary  Bruising and soreness at the IV site are common.  Check your insertion site every day for signs of infection.  Rest as told by your doctor. Go back to your normal activities as told by your doctor.  Get help right away if you have signs of a serious reaction. This information is not intended to replace advice given to you by your health care provider. Make sure you discuss any questions you have with your health care  provider. Document Revised: 10/08/2018 Document Reviewed: 10/08/2018 Elsevier Patient Education  2021 [place].    To help prevent nausea and vomiting after your treatment, we encourage you to take your nausea medication as directed.  BELOW ARE SYMPTOMS THAT SHOULD BE REPORTED IMMEDIATELY: . *FEVER GREATER THAN 100.4 F (38 C) OR HIGHER . *CHILLS OR SWEATING . *NAUSEA AND VOMITING THAT IS NOT CONTROLLED WITH YOUR NAUSEA MEDICATION . *UNUSUAL SHORTNESS OF BREATH . *UNUSUAL BRUISING OR BLEEDING . *URINARY PROBLEMS (pain or burning when urinating, or frequent urination) . *BOWEL PROBLEMS (unusual diarrhea, constipation, pain near the anus) . TENDERNESS IN MOUTH AND THROAT WITH OR WITHOUT PRESENCE OF ULCERS (sore throat, sores in mouth, or a toothache) . UNUSUAL RASH, SWELLING OR PAIN  . UNUSUAL VAGINAL DISCHARGE OR ITCHING   Items with * indicate a potential emergency and should be followed up as soon as possible or go to the Emergency Department if any problems should occur.  Please show the CHEMOTHERAPY ALERT CARD or IMMUNOTHERAPY ALERT CARD at check-in to the Emergency Department and triage nurse.  Should you have questions after your visit or need to cancel or reschedule your appointment, please contact [facility]  [phone] and follow the prompts.  Office hours are 8:00 a.m. to 4:30 p.m. Monday - Friday. Please note that voicemails left after 4:00 p.m. may not be returned until the following business day.  We are closed weekends and major holidays. You have access to a nurse at all times for urgent questions. Please call the main number to the clinic [phone] and follow the prompts.  For any non-urgent questions, you may also contact your provider using MyChart. We now offer e-Visits for anyone 67 and older to request care online for non-urgent symptoms. For details visit [email or web address].   Also download the MyChart app! Go to the  app store, search "MyChart", open the app, select [facility], and log in with your MyChart username and password.  Due to Covid, a mask is required upon entering the hospital/clinic. If you do not have a mask, one will be given to you upon arrival. For doctor visits, patients may have 1 support person aged 54 or older with them. For treatment visits, patients cannot have anyone with them due to current Covid guidelines and our immunocompromised population.

## 2020-10-04 NOTE — Progress Notes (Signed)
[facility] OFFICE PROGRESS NOTE  Patient Care Team: [name], MD as PCP - General (Internal Medicine) Cammie Sickle, MD as Consulting Physician (Hematology and Oncology) Borders, Kirt Boys, NP as Nurse Practitioner (Hospice and Palliative Medicine) Verlon Au, NP as Nurse Practitioner (Hematology and Oncology) Jacquelin Hawking, NP as Nurse Practitioner (Oncology) Lonell Face, NP as Nurse Practitioner (Neurosurgery) Milinda Pointer, MD as Consulting Physician (Pain Medicine)  Cancer Staging No matching staging information was found for the patient.   Oncology History Overview Note  # 2006- CLL STAGE IV; MAY 2011- WBC- 57K;Platelets-99;Hb-12/CT Bulky LN; BMBx- 80% Invol; del 11; START Benda-Ritux x4 [finished Sep 2011];   # July 2015-Progression; Sep 2015-START ibrutinib; CT scan DEC 2015- Improvement LN; Cont Ibrutinib 116m/d; NOV 2016 CT- 1-2CM LN [mild progression compared to Dec 2015];NOV 2016- FISH peripheral blood- NO MUTATIONS/CD-38 Positive; NOV 7th- CONT IBRUTINIB 2 pills/day; CT AUG 2017- STABLE;  DEC 6th PET- Mild RP LN/ Retrocrural LN  # OFF ibrutinib [? intol]- sep 2019- Jan 16th 2020; Re-start Ibrutinib; September 2020-stop ibrutinib [poor tolerance/worsening anemia]  # Jan 16th 2020- start aranesp; [place]   # MARCH 11th 2021- Gazyva x 6 finished AUG 2021.   #April May 2022-worsening anemia- BMBx- CLL; cytogenetics: 20 q del [incidental]; rFISH- 17p/11q DEL  # Early May 2022- START IBRUTINIB 420 mg/day  # SEP 2021- prostate enlargement on CT scan- UA-NEG; PSA- WNL; s/p Dr.Wolff.    # DEC 2019- PAIN CONTRACT  # PALLIATIVE CARE- 06/21/2019-  # October 2019-bone marrow biopsy [worsening anemia]-question dyserythropoietic changes/small clone of CLL;  # [facility] HEM- NEG.  SA skin infection [Oct 2016] s/p clinda -------------------------------------------------------    DIAGNOSIS: CLL  STAGE:   IV       ;GOALS: palliative  CURRENT/MOST RECENT THERAPY : Ibrutinib    CLL (chronic lymphocytic leukemia) ([facility]  07/08/2019 -  Chemotherapy   The patient had obinutuzumab (GAZYVA) 100 mg in sodium chloride 0.9 % 100 mL (0.9615 mg/mL) chemo infusion, 100 mg, Intravenous, Once, 6 of 6 cycles Administration: 100 mg (07/08/2019), 900 mg (07/09/2019), 1,000 mg (07/26/2019), 1,000 mg (08/16/2019), 1,000 mg (08/02/2019), 1,000 mg (09/13/2019), 1,000 mg (10/11/2019), 1,000 mg (11/09/2019), 1,000 mg (12/07/2019)  for chemotherapy treatment.      INTERVAL HISTORY:  James [age]MEN89y.o.  male pleasant patient above history of CLL and chronic abdominal pain of unclear etiology; severe anemia of unclear etiology is here for follow-up.  Patient was started on ibrutinib on 08/31/2020.  He admits to being compliant with the medication.  Denies nausea vomiting.  No diarrhea.  He continues to have chronic abdominal pain markedly worse.  He denies any bleeding. Reports poor appetite.   He last received a blood transfusion on 09/22/20 for hemoglobin of 7.    Review of Systems  Constitutional: Positive for malaise/fatigue and weight loss. Negative for chills and fever.  HENT: Negative for congestion and nosebleeds.   Eyes: Negative for blurred vision and double vision.  Respiratory: Negative for cough and shortness of breath.   Cardiovascular: Negative for chest pain and palpitations.  Gastrointestinal: Positive for abdominal pain, constipation, nausea and vomiting.       Poor appetite  Genitourinary: Negative for dysuria, hematuria and urgency.  Musculoskeletal: Negative for myalgias.  Skin: Negative for rash.  Neurological: Negative for dizziness, speech change, focal weakness and headaches.  Psychiatric/Behavioral: Positive for depression. The patient is not nervous/anxious and does not have insomnia.  PAST MEDICAL HISTORY :  Past Medical History:  Diagnosis Date   CLL (chronic lymphocytic leukemia)  ([facility])    Constipation    Depression    Diabetes mellitus without complication (HCC)    GERD (gastroesophageal reflux disease)    Hematuria    Hyperlipidemia    Hypertension    Therapeutic opioid induced constipation     PAST SURGICAL HISTORY :   Past Surgical History:  Procedure Laterality Date   CATARACT EXTRACTION W/ INTRAOCULAR LENS IMPLANT Bilateral    CHOLECYSTECTOMY  1983   COLONOSCOPY WITH PROPOFOL N/A 10/27/2017   Procedure: COLONOSCOPY WITH PROPOFOL;  Surgeon: Manya Silvas, MD;  [facility] END[facility];  Service: Endoscopy;  Laterality: N/A;   ESOPHAGOGASTRODUODENOSCOPY (EGD) WITH PROPOFOL N/A 11/20/2016   Procedure: ESOPHAGOGASTRODUODENOSCOPY (EGD) WITH PROPOFOL;  Surgeon: Manya Silvas, MD;  Location: [facility] ENDOSCOPY;  Service: Endoscopy;  Laterality: N/A;   ESOPHAGOGASTRODUODENOSCOPY (EGD) WITH PROPOFOL N/A 10/27/2017   Procedure: ESOPHAGOGASTRODUODENOSCOPY (EGD) WITH PROPOFOL;  Surgeon: Manya Silvas, MD;  Location: [facility] ENDOSCOPY;  Service: Endoscopy;  Laterality: N/A;   INTRATHECAL PUMP IMPLANT Left 02/07/2020   Procedure: INTRATHECAL PUMP & CATHETER IMPLANT;  Surgeon: Deetta Perla, MD;  Location: [facility] ORS;  Service: Neurosurgery;  Laterality: Left;   LOWER EXTREMITY ANGIOGRAPHY Right 05/19/2017   Procedure: LOWER EXTREMITY ANGIOGRAPHY;  Surgeon: Algernon Huxley, MD;  Location: [facility] CV LAB;  Service: Cardiovascular;  Laterality: Right;   LOWER EXTREMITY ANGIOGRAPHY Left 08/10/2018   Procedure: LOWER EXTREMITY ANGIOGRAPHY;  Surgeon: Algernon Huxley, MD;  Location: [facility] CV LAB;  Service: Cardiovascular;  Laterality: Left;    FAMILY HISTORY :   Family History  Problem Relation Age of Onset   Hypertension Sister     SOCIAL HISTORY:   Social History   Tobacco Use   Smoking status: Current Every Day Smoker    Packs/day: 0.50    Years: 47.00    Pack years: 23.50    Types: Cigarettes   Smokeless tobacco: Never Used  Scientific laboratory technician Use:  Never used  Substance Use Topics   Alcohol use: Yes    Alcohol/week: 1.0 standard drink    Types: 1 Glasses of wine per week    Comment:  almost none in last 6 months   Drug use: No    ALLERGIES:  has No Known Allergies.  MEDICATIONS:  Current Outpatient Medications  Medication Sig Dispense Refill   AMBULATORY NON FORMULARY MEDICATION Medication Name: Intrathecal pump Bupivacaine 20.0 mg/ml Fentanyl 2,000.0 mcg/ml Dose 1846.4 mcg/day     atorvastatin (LIPITOR) 10 MG tablet Take 1 tablet (10 mg total) by mouth daily. 30 tablet 11   clopidogrel (PLAVIX) 75 MG tablet Take 75 mg by mouth daily.      Ensure (ENSURE) Take 237 mLs by mouth 3 (three) times daily between meals.      folic acid (FOLVITE) 1 MG tablet Take 1 tablet (1 mg total) by mouth daily. 90 tablet 1   gabapentin (NEURONTIN) 300 MG capsule Take 3 capsules (900 mg total) by mouth at bedtime AND 1 capsule (300 mg total) 2 (two) times daily. 150 capsule 2   ibrutinib 420 MG TABS Take 420 mg by mouth daily. 28 tablet 4   metFORMIN (GLUCOPHAGE) 1000 MG tablet Take 1 tablet (1,000 mg total) by mouth daily with breakfast. 30 tablet 3   methocarbamol (ROBAXIN) 500 MG tablet Take 1 tablet (500 mg total) by mouth every 6 (six) hours as needed for muscle  spasms. 80 tablet 0   mirtazapine (REMERON) 30 MG tablet Take 1 tablet (30 mg total) by mouth at bedtime. 30 tablet 3   naloxegol oxalate (MOVANTIK) 25 MG TABS tablet Take 1 tablet (25 mg total) by mouth daily. 30 tablet 2   naloxone (NARCAN) 2 MG/2ML injection Inject 1 mL (1 mg total) into the muscle as needed for up to 2 doses (for opioid overdose). In case of emergency (overdose), inject into muscle of upper arm or leg and call 911. 2 mL 0   Oxycodone HCl 10 MG TABS Take 1 tablet (10 mg total) by mouth every 4 (four) hours as needed. Must last 30 days 180 tablet 0   [START ON 10/09/2020] Oxycodone HCl 10 MG TABS Take 1 tablet (10 mg total) by mouth every 4 (four) hours as needed. Must  last 30 days 180 tablet 0   [START ON 11/08/2020] Oxycodone HCl 10 MG TABS Take 1 tablet (10 mg total) by mouth every 4 (four) hours as needed. Must last 30 days 180 tablet 0   [START ON 12/08/2020] Oxycodone HCl 10 MG TABS Take 1 tablet (10 mg total) by mouth every 4 (four) hours as needed. Must last 30 days 180 tablet 0   pantoprazole (PROTONIX) 40 MG tablet Take 40 mg by mouth at bedtime.     No current facility-administered medications for this visit.    PHYSICAL EXAMINATION: ECOG PERFORMANCE STATUS: 1 - Symptomatic but completely ambulatory  BP (!) 154/69 (BP Location: Left Arm, Patient Position: Sitting)   Pulse 75   Temp 97.8 F (36.6 C) (Tympanic)   Resp 18   Wt 64.4 kg   SpO2 100%   BMI 21.59 kg/m   Filed Weights   10/04/20 0925  Weight: 142 lb (64.4 kg)    Physical Exam Vitals and nursing note reviewed.  Constitutional:      General: He is not in acute distress.    Appearance: He is ill-appearing.  HENT:     Head: Normocephalic and atraumatic.     Nose: No congestion or rhinorrhea.     Mouth/Throat:     Mouth: Mucous membranes are moist.     Pharynx: Oropharynx is clear.  Eyes:     General:        Right eye: No discharge.        Left eye: No discharge.     Extraocular Movements: Extraocular movements intact.     Conjunctiva/sclera: Conjunctivae normal.  Cardiovascular:     Rate and Rhythm: Normal rate and regular rhythm.     Pulses: Normal pulses.     Heart sounds: No murmur heard.   Pulmonary:     Effort: Pulmonary effort is normal.     Breath sounds: No wheezing, rhonchi or rales.  Abdominal:     General: Abdomen is flat. There is no distension.     Tenderness: There is abdominal tenderness. There is guarding.  Genitourinary:    Comments: Not examined. Musculoskeletal:        General: No swelling or deformity.     Cervical back: Neck supple.     Right lower leg: No edema.     Left lower leg: No edema.  Skin:    General: Skin is warm and dry.      Capillary Refill: Capillary refill takes 2 to 3 seconds.     Coloration: Skin is pale.  Neurological:     Mental Status: He is alert and oriented to person, place, and time.  Psychiatric:        Thought Content: Thought content normal.     Comments: Flat affect       LABORATORY DATA:  I have reviewed the data as listed    Component Value Date/Time   NA 139 10/04/2020 0851   NA 142 05/03/2014 1139   K 4.4 10/04/2020 0851   K 4.7 05/03/2014 1139   CL 105 10/04/2020 0851   CL 110 (H) 05/03/2014 1139   CO2 24 10/04/2020 0851   CO2 24 05/03/2014 1139   GLUCOSE 103 (H) 10/04/2020 0851   GLUCOSE 98 05/03/2014 1139   BUN 33 (H) 10/04/2020 0851   BUN 20 (H) 05/03/2014 1139   CREATININE 1.23 10/04/2020 0851   CREATININE 1.22 08/09/2014 1122   CALCIUM 9.0 10/04/2020 0851   CALCIUM 8.6 05/03/2014 1139   PROT 6.3 (L) 10/04/2020 0851   PROT 7.5 05/03/2014 1139   ALBUMIN 4.1 10/04/2020 0851   ALBUMIN 4.1 05/03/2014 1139   AST 11 (L) 10/04/2020 0851   AST 15 05/03/2014 1139   ALT 8 10/04/2020 0851   ALT 26 05/03/2014 1139   ALKPHOS 62 10/04/2020 0851   ALKPHOS 94 05/03/2014 1139   BILITOT 0.8 10/04/2020 0851   BILITOT 0.5 05/03/2014 1139   GFRNONAA >60 10/04/2020 0851   GFRNONAA >60 08/09/2014 1122   GFRAA 55 (L) 01/24/2020 1156   GFRAA >60 08/09/2014 1122    No results found for: SPEP, UPEP  Lab Results  Component Value Date   WBC 15.4 (H) 10/04/2020   NEUTROABS PENDING 10/04/2020   HGB 6.2 (L) 10/04/2020   HCT 19.3 (L) 10/04/2020   MCV 102.7 (H) 10/04/2020   PLT 141 (L) 10/04/2020      Chemistry      Component Value Date/Time   NA 139 10/04/2020 0851   NA 142 05/03/2014 1139   K 4.4 10/04/2020 0851   K 4.7 05/03/2014 1139   CL 105 10/04/2020 0851   CL 110 (H) 05/03/2014 1139   CO2 24 10/04/2020 0851   CO2 24 05/03/2014 1139   BUN 33 (H) 10/04/2020 0851   BUN 20 (H) 05/03/2014 1139   CREATININE 1.23 10/04/2020 0851   CREATININE 1.22 08/09/2014 1122       Component Value Date/Time   CALCIUM 9.0 10/04/2020 0851   CALCIUM 8.6 05/03/2014 1139   ALKPHOS 62 10/04/2020 0851   ALKPHOS 94 05/03/2014 1139   AST 11 (L) 10/04/2020 0851   AST 15 05/03/2014 1139   ALT 8 10/04/2020 0851   ALT 26 05/03/2014 1139   BILITOT 0.8 10/04/2020 0851   BILITOT 0.5 05/03/2014 1139       RADIOGRAPHIC STUDIES: I have personally reviewed the radiological images as listed and agreed with the findings in the report. No results found.   ASSESSMENT & PLAN:  No problem-specific Assessment & Plan notes found for this encounter.  No orders of the defined types were placed in this encounter.  1. CLL   Most recently s/p Gazyva x6 cycles [AUG 2021] 15th MARCH 2022 BONE MARROW BIOPSY- [April 2022]- RECURRENT/RELPASED CLL;CLL FISH- 11 q/17p DELETION.  On IBRUTINIB 420 mg/day tolerating fairly well; except for anemia  Requires frequent PRBC Proceed with 1 unit PRBC's today for a hemoglobin 6.2.  2. Chronic Abdominal Pain  Followed by pain management with pain pump. associated symptoms include: early satiety and poor appetite.  Work-up so far has been unremarkable Followed by Dr. Raeanne Barry Management Continues to take Oxycodone 12m, every 4 hours  for abdominal pain-reports minimal relief   3. Poor Appetite  On remeron Reports drinking 1-2 ensure's per day  He does not eat 3 meals/day -See's dietician today   4. Opioid constipation Patient taking 103m OxyIR ever 4 hours   Patient reports 2-3 bowel movements per week (waxing and waning in consistency from hard to soft). Denies hematochezia/melena  Reports using OTC miralax every night    Disposition:  1 unit PRBC today.  RTC in 2 weeks to see Dr. B with labs cbc/CMP;LDH-/hold tube- 1unit PRBC   The patient's diagnosis, an outline of the further diagnostic and laboratory studies which will be required, the recommendation for surgery, and alternatives were discussed with her and her accompanying family  members.  All questions were answered to their satisfaction.  I personally had a face to face interaction and evaluated the patient jointly with the NP Student, Mrs. SBenedetto Goad  I have reviewed her history and available records and have performed the key portions of the physical exam including general, HEENT, abdominal exam, pelvic exam with my findings confirming those documented above by the APP student.  I have discussed the case with the APP student and the patient.  I agree with the above documentation, assessment and plan which was fully formulated by me.  Counseling was completed by me.    SBenedetto Goad Student FNP  Greater than 50% was spent in counseling and coordination of care with this patient including but not limited to discussion of the relevant topics above (See A&P) including, but not limited to diagnosis and management of acute and chronic medical conditions.   All questions were answered. The patient knows to call the clinic with any problems, questions or concerns.     JJacquelin Hawking NP 10/04/2020 9:49 AM

## 2020-10-05 ENCOUNTER — Other Ambulatory Visit (HOSPITAL_COMMUNITY): Payer: Self-pay

## 2020-10-05 LAB — BPAM RBC
Blood Product Expiration Date: 202206162359
ISSUE DATE / TIME: 202206081141
Unit Type and Rh: 600

## 2020-10-05 LAB — TYPE AND SCREEN
ABO/RH(D): A POS
Antibody Screen: NEGATIVE
Unit division: 0

## 2020-10-08 ENCOUNTER — Encounter: Payer: Self-pay | Admitting: Internal Medicine

## 2020-10-16 ENCOUNTER — Telehealth: Payer: Self-pay | Admitting: Internal Medicine

## 2020-10-17 ENCOUNTER — Encounter: Payer: Self-pay | Admitting: Internal Medicine

## 2020-10-17 NOTE — Telephone Encounter (Signed)
Patients son(Nic) called requesting to move his appointment on 6/22 to 6/23 or 6/24.  Appt moved to 6/24 with Sonia Baller to accomodate.  Appointment confirmed.

## 2020-10-18 ENCOUNTER — Inpatient Hospital Stay: Payer: Medicare Other

## 2020-10-18 ENCOUNTER — Inpatient Hospital Stay: Payer: Medicare Other | Admitting: Internal Medicine

## 2020-10-20 ENCOUNTER — Inpatient Hospital Stay: Payer: Medicare Other

## 2020-10-20 ENCOUNTER — Inpatient Hospital Stay (HOSPITAL_BASED_OUTPATIENT_CLINIC_OR_DEPARTMENT_OTHER): Payer: Medicare Other | Admitting: Oncology

## 2020-10-20 ENCOUNTER — Encounter: Payer: Self-pay | Admitting: Oncology

## 2020-10-20 VITALS — BP 112/68 | HR 60 | Temp 97.3°F | Resp 18

## 2020-10-20 VITALS — BP 144/61 | HR 77 | Temp 98.7°F | Resp 20 | Wt 139.0 lb

## 2020-10-20 DIAGNOSIS — C9110 Chronic lymphocytic leukemia of B-cell type not having achieved remission: Secondary | ICD-10-CM

## 2020-10-20 DIAGNOSIS — D649 Anemia, unspecified: Secondary | ICD-10-CM

## 2020-10-20 DIAGNOSIS — R5383 Other fatigue: Secondary | ICD-10-CM

## 2020-10-20 LAB — COMPREHENSIVE METABOLIC PANEL
ALT: 7 U/L (ref 0–44)
AST: 11 U/L — ABNORMAL LOW (ref 15–41)
Albumin: 4.2 g/dL (ref 3.5–5.0)
Alkaline Phosphatase: 69 U/L (ref 38–126)
Anion gap: 9 (ref 5–15)
BUN: 30 mg/dL — ABNORMAL HIGH (ref 8–23)
CO2: 23 mmol/L (ref 22–32)
Calcium: 9.1 mg/dL (ref 8.9–10.3)
Chloride: 106 mmol/L (ref 98–111)
Creatinine, Ser: 1.23 mg/dL (ref 0.61–1.24)
GFR, Estimated: >60 mL/min (ref >60)
Glucose, Bld: 90 mg/dL (ref 70–99)
Potassium: 4.4 mmol/L (ref 3.5–5.1)
Sodium: 138 mmol/L (ref 135–145)
Total Bilirubin: 0.8 mg/dL (ref 0.3–1.2)
Total Protein: 6.5 g/dL (ref 6.5–8.1)

## 2020-10-20 LAB — CBC
HCT: 20.7 % — ABNORMAL LOW (ref 39.0–52.0)
Hemoglobin: 6.5 g/dL — ABNORMAL LOW (ref 13.0–17.0)
MCH: 32.8 pg (ref 26.0–34.0)
MCHC: 31.4 g/dL (ref 30.0–36.0)
MCV: 104.5 fL — ABNORMAL HIGH (ref 80.0–100.0)
Platelets: 193 10*3/uL (ref 150–400)
RBC: 1.98 MIL/uL — ABNORMAL LOW (ref 4.22–5.81)
RDW: 21.6 % — ABNORMAL HIGH (ref 11.5–15.5)
WBC: 20.1 10*3/uL — ABNORMAL HIGH (ref 4.0–10.5)
nRBC: 0 % (ref 0.0–0.2)

## 2020-10-20 LAB — SAMPLE TO BLOOD BANK

## 2020-10-20 LAB — LACTATE DEHYDROGENASE: LDH: 99 U/L (ref 98–192)

## 2020-10-20 LAB — PREPARE RBC (CROSSMATCH)

## 2020-10-20 MED ORDER — ACETAMINOPHEN 325 MG PO TABS
650.0000 mg | ORAL_TABLET | Freq: Once | ORAL | Status: AC
  Administered 2020-10-20: 650 mg via ORAL
  Filled 2020-10-20: qty 2

## 2020-10-20 MED ORDER — SODIUM CHLORIDE 0.9 % IV SOLN
INTRAVENOUS | Status: DC
  Filled 2020-10-20: qty 250

## 2020-10-20 MED ORDER — DIPHENHYDRAMINE HCL 25 MG PO CAPS
25.0000 mg | ORAL_CAPSULE | Freq: Once | ORAL | Status: AC
  Administered 2020-10-20: 25 mg via ORAL
  Filled 2020-10-20: qty 1

## 2020-10-20 MED ORDER — SODIUM CHLORIDE 0.9 % IV SOLN
10.0000 mg | Freq: Once | INTRAVENOUS | Status: AC
Start: 2020-10-20 — End: 2020-10-20
  Administered 2020-10-20: 10 mg via INTRAVENOUS
  Filled 2020-10-20: qty 10

## 2020-10-20 MED ORDER — SODIUM CHLORIDE 0.9% IV SOLUTION
250.0000 mL | Freq: Once | INTRAVENOUS | Status: AC
  Administered 2020-10-20: 250 mL via INTRAVENOUS
  Filled 2020-10-20: qty 250

## 2020-10-20 MED ORDER — DEXAMETHASONE 4 MG PO TABS: 4.0000 mg | ORAL_TABLET | Freq: Two times a day (BID) | ORAL | 0 refills | Status: DC

## 2020-10-20 NOTE — Progress Notes (Signed)
Nutrition Follow-up:  Patient with stage IV CLL.  Patient on ibrutinib.  Patient has pain pump for abdominal pain.    Met with patient during infusion.  Patient reports that he is not feeling good and does not have any appetite.  Likes the [employer] 1.4 shakes and feels like it sits better on stomach.  Patient received more samples from the company.  Wanting to know how to get more.    Dexamethasone added today  Medications: reviewed  Labs: reviewed  Anthropometrics:   Weight 139 lb today  142 lb on 6/8 140 lb on 4/26 138 lb on 3/31 145 lb on 11/15 162 lb on 10/6   NUTRITION DIAGNOSIS: Inadequate oral intake continues    INTERVENTION:  Discussed with patient on how to order online and via phone.  Instructions written for patient.  Patient said that son could help him order a case of shakes.  Patient to call RD if has questions     MONITORING, EVALUATION, GOAL: weight trends, intake   NEXT VISIT: to be scheduled with treatment  Taksh Hjort B. Zenia Resides, [place], [place] Registered Dietitian [phone] (mobile)

## 2020-10-20 NOTE — Progress Notes (Signed)
[facility] OFFICE PROGRESS NOTE  Patient Care Team: [name], MD as PCP - General (Internal Medicine) Cammie Sickle, MD as Consulting Physician (Hematology and Oncology) Borders, Kirt Boys, NP as Nurse Practitioner (Hospice and Palliative Medicine) Verlon Au, NP as Nurse Practitioner (Hematology and Oncology) Jacquelin Hawking, NP as Nurse Practitioner (Oncology) Lonell Face, NP as Nurse Practitioner (Neurosurgery) Milinda Pointer, MD as Consulting Physician (Pain Medicine)  Cancer Staging No matching staging information was found for the patient.   Oncology History Overview Note  # 2006- CLL STAGE IV; MAY 2011- WBC- 57K;Platelets-99;Hb-12/CT Bulky LN; BMBx- 80% Invol; del 11; START Benda-Ritux x4 [finished Sep 2011];   # July 2015-Progression; Sep 2015-START ibrutinib; CT scan DEC 2015- Improvement LN; Cont Ibrutinib 160m/d; NOV 2016 CT- 1-2CM LN [mild progression compared to Dec 2015];NOV 2016- FISH peripheral blood- NO MUTATIONS/CD-38 Positive; NOV 7th- CONT IBRUTINIB 2 pills/day; CT AUG 2017- STABLE;  DEC 6th PET- Mild RP LN/ Retrocrural LN  # OFF ibrutinib [? intol]- sep 2019- Jan 16th 2020; Re-start Ibrutinib; September 2020-stop ibrutinib [poor tolerance/worsening anemia]  # Jan 16th 2020- start aranesp; HOLD while on GLima   # MARCH 11th 2021- Gazyva x 6 finished AUG 2021.   #April May 2022-worsening anemia- BMBx- CLL; cytogenetics: 20 q del [incidental]; rFISH- 17p/11q DEL  # Early May 2022- START IBRUTINIB 420 mg/day  # SEP 2021- prostate enlargement on CT scan- UA-NEG; PSA- WNL; s/p Dr.Wolff.    # DEC 2019- PAIN CONTRACT  # PALLIATIVE CARE- 06/21/2019-  # October 2019-bone marrow biopsy [worsening anemia]-question dyserythropoietic changes/small clone of CLL;  # [facility] HEM- NEG.  SA skin infection [Oct 2016] s/p clinda -------------------------------------------------------    DIAGNOSIS: CLL  STAGE:   IV       ;GOALS: palliative  CURRENT/MOST RECENT THERAPY : Ibrutinib    [place]  [place] -  Chemotherapy   The patient had obinutuzumab (GAZYVA) 100 mg in sodium chloride 0.9 % 100 mL (0.9615 mg/mL) chemo infusion, 100 mg, Intravenous, Once, 6 of 6 cycles Administration: 100 mg (07/08/2019), 900 mg (07/09/2019), 1,000 mg (07/26/2019), 1,000 mg (08/16/2019), 1,000 mg (08/02/2019), 1,000 mg (09/13/2019), 1,000 mg (10/11/2019), 1,000 mg (11/09/2019), 1,000 mg (12/07/2019)  for chemotherapy treatment.      INTERVAL HISTORY:  James [id number].  male pleasant patient above history of CLL and chronic abdominal pain of unclear etiology; severe anemia of unclear etiology is here for follow-up.  Patient was started on ibrutinib on 08/31/2020.  He admits to being compliant with the medication.  Denies nausea vomiting.  No diarrhea.  He continues to have chronic abdominal pain that is chronic and stable.  He is followed by pain management.  Reports sleeping a lot, increased weakness and very poor appetite.  States he has no energy.  I took everything out of him today to get up to come to our clinic.  He receives blood transfusions every other week.   Review of Systems  Constitutional:  Positive for malaise/fatigue and weight loss. Negative for chills and fever.  HENT:  Negative for congestion and nosebleeds.   Eyes:  Negative for blurred vision and double vision.  Respiratory:  Negative for cough and shortness of breath.   Cardiovascular:  Negative for chest pain and palpitations.  Gastrointestinal:  Positive for abdominal pain, constipation, nausea and vomiting.       Poor appetite  Genitourinary:  Negative for dysuria, hematuria and urgency.  Musculoskeletal:  Negative  for myalgias.  Skin:  Negative for rash.  Neurological:  Negative for dizziness, speech change, focal weakness and headaches.  Psychiatric/Behavioral:  Positive for depression. The patient is not  nervous/anxious and does not have insomnia.       PAST MEDICAL HISTORY :  Past Medical History:  Diagnosis Date   CLL (chronic lymphocytic leukemia) ([place])    Constipation    Depression    Diabetes mellitus without complication (HCC)    GERD (gastroesophageal reflux disease)    Hematuria    Hyperlipidemia    Hypertension    Therapeutic opioid induced constipation     PAST SURGICAL HISTORY :   Past Surgical History:  Procedure Laterality Date   CATARACT EXTRACTION W/ INTRAOCULAR LENS IMPLANT Bilateral    CHOLECYSTECTOMY  1983   COLONOSCOPY WITH PROPOFOL N/A 10/27/2017   Procedure: COLONOSCOPY WITH PROPOFOL;  Surgeon: Manya Silvas, MD;  Location: [facility] ENDOSCOPY;  Service: Endoscopy;  Laterality: N/A;   ESOPHAGOGASTRODUODENOSCOPY (EGD) WITH PROPOFOL N/A 11/20/2016   Procedure: ESOPHAGOGASTRODUODENOSCOPY (EGD) WITH PROPOFOL;  Surgeon: Manya Silvas, MD;  Location: [facility] ENDOSCOPY;  Service: Endoscopy;  Laterality: N/A;   ESOPHAGOGASTRODUODENOSCOPY (EGD) WITH PROPOFOL N/A 10/27/2017   Procedure: ESOPHAGOGASTRODUODENOSCOPY (EGD) WITH PROPOFOL;  Surgeon: Manya Silvas, MD;  Location: [facility] ENDOSCOPY;  Service: Endoscopy;  Laterality: N/A;   INTRATHECAL PUMP IMPLANT Left 02/07/2020   Procedure: INTRATHECAL PUMP & CATHETER IMPLANT;  Surgeon: Deetta Perla, MD;  Location: [facility] ORS;  Service: Neurosurgery;  Laterality: Left;   LOWER EXTREMITY ANGIOGRAPHY Right 05/19/2017   Procedure: LOWER EXTREMITY ANGIOGRAPHY;  Surgeon: Algernon Huxley, MD;  Location: [facility] CV LAB;  Service: Cardiovascular;  Laterality: Right;   LOWER EXTREMITY ANGIOGRAPHY Left 08/10/2018   Procedure: LOWER EXTREMITY ANGIOGRAPHY;  Surgeon: Algernon Huxley, MD;  Location: [place] [facility] CV LAB;  Service: Cardiovascular;  Laterality: Left;    FAMILY HISTORY :   Family History  Problem Relation Age of Onset   Hypertension Sister     SOCIAL HISTORY:   Social History   Tobacco Use   Smoking status: Every Day     Packs/day: 0.50    Years: 47.00    Pack years: 23.50    Types: Cigarettes   Smokeless tobacco: Never  Vaping Use   Vaping Use: Never used  Substance Use Topics   Alcohol use: Yes    Alcohol/week: 1.0 standard drink    Types: 1 Glasses of wine per week    Comment:  almost none in last 6 months   Drug use: No    ALLERGIES:  has No Known Allergies.  MEDICATIONS:  Current Outpatient Medications  Medication Sig Dispense Refill   AMBULATORY NON FORMULARY MEDICATION Medication Name: Intrathecal pump Bupivacaine 20.0 mg/ml Fentanyl 2,000.0 mcg/ml Dose 1846.4 mcg/day     atorvastatin (LIPITOR) 10 MG tablet Take 1 tablet (10 mg total) by mouth daily. 30 tablet 11   clopidogrel (PLAVIX) 75 MG tablet Take 75 mg by mouth daily.      Ensure (ENSURE) Take 237 mLs by mouth 3 (three) times daily between meals.      folic acid (FOLVITE) 1 MG tablet Take 1 tablet (1 mg total) by mouth daily. 90 tablet 1   gabapentin (NEURONTIN) 300 MG capsule Take 3 capsules (900 mg total) by mouth at bedtime AND 1 capsule (300 mg total) 2 (two) times daily. 150 capsule 2   ibrutinib 420 MG TABS Take 420 mg by mouth daily. 28 tablet 4   metFORMIN (GLUCOPHAGE) 1000  MG tablet Take 1 tablet (1,000 mg total) by mouth daily with breakfast. 30 tablet 3   methocarbamol (ROBAXIN) 500 MG tablet Take 1 tablet (500 mg total) by mouth every 6 (six) hours as needed for muscle spasms. 80 tablet 0   mirtazapine (REMERON) 30 MG tablet Take 1 tablet (30 mg total) by mouth at bedtime. 30 tablet 3   naloxegol oxalate (MOVANTIK) 25 MG TABS tablet Take 1 tablet (25 mg total) by mouth daily. 30 tablet 2   naloxone (NARCAN) 2 MG/2ML injection Inject 1 mL (1 mg total) into the muscle as needed for up to 2 doses (for opioid overdose). In case of emergency (overdose), inject into muscle of upper arm or leg and call 911. 2 mL 0   Oxycodone HCl 10 MG TABS Take 1 tablet (10 mg total) by mouth every 4 (four) hours as needed. Must last 30 days 180  tablet 0   Oxycodone HCl 10 MG TABS Take 1 tablet (10 mg total) by mouth every 4 (four) hours as needed. Must last 30 days 180 tablet 0   [START ON 11/08/2020] Oxycodone HCl 10 MG TABS Take 1 tablet (10 mg total) by mouth every 4 (four) hours as needed. Must last 30 days 180 tablet 0   [START ON 12/08/2020] Oxycodone HCl 10 MG TABS Take 1 tablet (10 mg total) by mouth every 4 (four) hours as needed. Must last 30 days 180 tablet 0   pantoprazole (PROTONIX) 40 MG tablet Take 40 mg by mouth at bedtime.     No current facility-administered medications for this visit.    PHYSICAL EXAMINATION: ECOG PERFORMANCE STATUS: 1 - Symptomatic but completely ambulatory  There were no vitals taken for this visit.  There were no vitals filed for this visit.   Physical Exam Vitals and nursing note reviewed.  Constitutional:      General: He is not in acute distress.    Appearance: He is ill-appearing.  HENT:     Head: Normocephalic and atraumatic.     Nose: No congestion or rhinorrhea.     Mouth/Throat:     Mouth: Mucous membranes are moist.     Pharynx: Oropharynx is clear.  Eyes:     General:        Right eye: No discharge.        Left eye: No discharge.     Extraocular Movements: Extraocular movements intact.     Conjunctiva/sclera: Conjunctivae normal.  Cardiovascular:     Rate and Rhythm: Normal rate and regular rhythm.     Pulses: Normal pulses.     Heart sounds: No murmur heard. Pulmonary:     Effort: Pulmonary effort is normal.     Breath sounds: No wheezing, rhonchi or rales.  Abdominal:     General: Abdomen is flat. There is no distension.     Tenderness: There is abdominal tenderness. There is guarding.  Genitourinary:    Comments: Not examined. Musculoskeletal:        General: No swelling or deformity.     Cervical back: Neck supple.     Right lower leg: No edema.     Left lower leg: No edema.  Skin:    General: Skin is warm and dry.     Capillary Refill: Capillary refill  takes 2 to 3 seconds.     Coloration: Skin is pale.  Neurological:     Mental Status: He is alert and oriented to person, place, and time.  Psychiatric:  Thought Content: Thought content normal.     Comments: Flat affect      LABORATORY DATA:  I have reviewed the data as listed    Component Value Date/Time   NA 139 10/04/2020 0851   NA 142 05/03/2014 1139   K 4.4 10/04/2020 0851   K 4.7 05/03/2014 1139   CL 105 10/04/2020 0851   CL 110 (H) 05/03/2014 1139   CO2 24 10/04/2020 0851   CO2 24 05/03/2014 1139   GLUCOSE 103 (H) 10/04/2020 0851   GLUCOSE 98 05/03/2014 1139   BUN 33 (H) 10/04/2020 0851   BUN 20 (H) 05/03/2014 1139   CREATININE 1.23 10/04/2020 0851   CREATININE 1.22 08/09/2014 1122   CALCIUM 9.0 10/04/2020 0851   CALCIUM 8.6 05/03/2014 1139   PROT 6.3 (L) 10/04/2020 0851   PROT 7.5 05/03/2014 1139   ALBUMIN 4.1 10/04/2020 0851   ALBUMIN 4.1 05/03/2014 1139   AST 11 (L) 10/04/2020 0851   AST 15 05/03/2014 1139   ALT 8 10/04/2020 0851   ALT 26 05/03/2014 1139   ALKPHOS 62 10/04/2020 0851   ALKPHOS 94 05/03/2014 1139   BILITOT 0.8 10/04/2020 0851   BILITOT 0.5 05/03/2014 1139   GFRNONAA >60 10/04/2020 0851   GFRNONAA >60 08/09/2014 1122   GFRAA 55 (L) 01/24/2020 1156   GFRAA >60 08/09/2014 1122    No results found for: SPEP, UPEP  Lab Results  Component Value Date   WBC 15.4 (H) 10/04/2020   NEUTROABS 0.8 (L) 10/04/2020   HGB 6.2 (L) 10/04/2020   HCT 19.3 (L) 10/04/2020   MCV 102.7 (H) 10/04/2020   PLT 141 (L) 10/04/2020      Chemistry      Component Value Date/Time   NA 139 10/04/2020 0851   NA 142 05/03/2014 1139   K 4.4 10/04/2020 0851   K 4.7 05/03/2014 1139   CL 105 10/04/2020 0851   CL 110 (H) 05/03/2014 1139   CO2 24 10/04/2020 0851   CO2 24 05/03/2014 1139   BUN 33 (H) 10/04/2020 0851   BUN 20 (H) 05/03/2014 1139   CREATININE 1.23 10/04/2020 0851   CREATININE 1.22 08/09/2014 1122      Component Value Date/Time   CALCIUM  9.0 10/04/2020 0851   CALCIUM 8.6 05/03/2014 1139   ALKPHOS 62 10/04/2020 0851   ALKPHOS 94 05/03/2014 1139   AST 11 (L) 10/04/2020 0851   AST 15 05/03/2014 1139   ALT 8 10/04/2020 0851   ALT 26 05/03/2014 1139   BILITOT 0.8 10/04/2020 0851   BILITOT 0.5 05/03/2014 1139       RADIOGRAPHIC STUDIES: I have personally reviewed the radiological images as listed and agreed with the findings in the report. No results found.   ASSESSMENT & PLAN:  No problem-specific Assessment & Plan notes found for this encounter.  No orders of the defined types were placed in this encounter.  1. CLL   Most recently s/p Gazyva x6 cycles [AUG 2021] 15th MARCH 2022 BONE MARROW BIOPSY- [April 2022]- RECURRENT/RELPASED CLL;CLL FISH- 11 q/17p DELETION.  On IBRUTINIB 420 mg/day tolerating poorly Has been having progressive weakness and fatigue along with poor appetite. He requires frequent blood transfusions without relief of his symptoms. Proceed with 1 unit PRBC's today for a hemoglobin 6.5. Hold ibrutinib until his next visit to see if his symptoms resolved.  2. Chronic Abdominal Pain  Followed by pain management with pain pump. associated symptoms include: early satiety and poor appetite.  Work-up so  far has been unremarkable Followed by Dr. Raeanne Barry Management Continues to take Oxycodone 35m, every 4 hours for abdominal pain-reports minimal relief   3. Poor Appetite  On remeron Reports drinking 1-2 ensure's per day  He does not eat 3 meals/day We will give him 10 mg dexamethasone today during his infusion and we will add on dexamethasone 4 mg tablets twice daily x10 days. I have asked that he hold his ibrutinib until his next visit to see if his appetite and generalized malaise improve.   4. Opioid constipation Patient taking 147mOxyIR ever 4 hours   Patient reports 2-3 bowel movements per week (waxing and waning in consistency from hard to soft). Denies hematochezia/melena  Reports  using OTC miralax every night   Disposition:  Hold ibrutinib Start dexamethasone 4 mg twice daily x10 days. 1 unit PRBC today.  RTC in 2 weeks to see Dr. B with labs cbc/CMP;LDH-/hold tube- 1unit PRBC   Discussed case with Dr. BrRogue Bussingnd AlNuala Alphaoral chemotherapy pharmacist).   Greater than 50% was spent in counseling and coordination of care with this patient including but not limited to discussion of the relevant topics above (See A&P) including, but not limited to diagnosis and management of acute and chronic medical conditions.   All questions were answered. The patient knows to call the clinic with any problems, questions or concerns.    JeJacquelin HawkingNP 10/20/2020 8:45 AM

## 2020-10-20 NOTE — Patient Instructions (Signed)
[facility]  Discharge Instructions: Thank you for choosing [facility] to provide your oncology and hematology care.  If you have a lab appointment with the Cancer Center, please go directly to the Cancer Center and check in at the registration area.  Wear comfortable clothing and clothing appropriate for easy access to any Portacath or PICC line.   We strive to give you quality time with your provider. You may need to reschedule your appointment if you arrive late (15 or more minutes).  Arriving late affects you and other patients whose appointments are after yours.  Also, if you miss three or more appointments without notifying the office, you may be dismissed from the clinic at the provider's discretion.      For prescription refill requests, have your pharmacy contact our office and allow 72 hours for refills to be completed.    Today you received the following : Blood transfusion   To help prevent nausea and vomiting after your treatment, we encourage you to take your nausea medication as directed.  BELOW ARE SYMPTOMS THAT SHOULD BE REPORTED IMMEDIATELY: *FEVER GREATER THAN 100.4 F (38 C) OR HIGHER *CHILLS OR SWEATING *NAUSEA AND VOMITING THAT IS NOT CONTROLLED WITH YOUR NAUSEA MEDICATION *UNUSUAL SHORTNESS OF BREATH *UNUSUAL BRUISING OR BLEEDING *URINARY PROBLEMS (pain or burning when urinating, or frequent urination) *BOWEL PROBLEMS (unusual diarrhea, constipation, pain near the anus) TENDERNESS IN MOUTH AND THROAT WITH OR WITHOUT PRESENCE OF ULCERS (sore throat, sores in mouth, or a toothache) UNUSUAL RASH, SWELLING OR PAIN  UNUSUAL VAGINAL DISCHARGE OR ITCHING   Items with * indicate a potential emergency and should be followed up as soon as possible or go to the Emergency Department if any problems should occur.  Please show the CHEMOTHERAPY ALERT CARD or IMMUNOTHERAPY ALERT CARD at check-in to the Emergency Department and  triage nurse.  Should you have questions after your visit or need to cancel or reschedule your appointment, please contact [facility]  [phone] and follow the prompts.  Office hours are 8:00 a.m. to 4:30 p.m. Monday - Friday. Please note that voicemails left after 4:00 p.m. may not be returned until the following business day.  We are closed weekends and major holidays. You have access to a nurse at all times for urgent questions. Please call the main number to the clinic [phone] and follow the prompts.  For any non-urgent questions, you may also contact your provider using MyChart. We now offer e-Visits for anyone 18 and older to request care online for non-urgent symptoms. For details visit [email or web address].   Also download the MyChart app! Go to the app store, search "MyChart", open the app, select [facility], and log in with your MyChart username and password.  Due to Covid, a mask is required upon entering the hospital/clinic. If you do not have a mask, one will be given to you upon arrival. For doctor visits, patients may have 1 support person aged 18 or older with them. For treatment visits, patients cannot have anyone with them due to current Covid guidelines and our immunocompromised population.  

## 2020-10-20 NOTE — Progress Notes (Signed)
Patient states not feeling to good, He has low energy and no appetite.

## 2020-10-21 LAB — BPAM RBC
Blood Product Expiration Date: 202207152359
ISSUE DATE / TIME: 202206241119
Unit Type and Rh: 5100

## 2020-10-21 LAB — TYPE AND SCREEN
ABO/RH(D): A POS
Antibody Screen: NEGATIVE
Unit division: 0

## 2020-10-23 NOTE — Progress Notes (Signed)
PROVIDER NOTE: Information contained herein reflects review and annotations entered in association with encounter. Interpretation of such information and data should be left to medically-trained personnel. Information provided to patient can be located elsewhere in the medical record under "Patient Instructions". Document created using STT-dictation technology, any transcriptional errors that may result from process are unintentional.    Patient: James [name]  Service Category: Procedure  Provider: Gaspar Cola, MD  DOB: 1950/08/02  DOS: 10/24/2020  Location: [name]  MRN: [id number]  Setting: Ambulatory - outpatient  Referring Provider: [name], MD  Type: Established Patient  Specialty: Interventional Pain Management  PCP: [name], MD   Primary Reason for Visit: Interventional Pain Management Treatment. CC: Abdominal Pain  Procedure:          Intrathecal Drug Delivery System (IDDS):  Type: Reservoir Refill [phone])       Region: Abdominal Laterality: Left  Type of Pump: Medtronic Synchromed II Delivery Route: Intrathecal Type of Pain Treated: Neuropathic/Nociceptive Primary Medication Class: Opioid/opiate  Medication, Concentration, Infusion Program, & Delivery Rate: Please see scanned programming printout.   Indications: 1. Chronic pain syndrome   2. Chronic epigastric pain (1ry area of Pain)   3. Chronic abdominal pain   4. Cancer-related pain   5. CLL (chronic lymphocytic leukemia) ([place])   6. Malignant lymphoma, small lymphocytic (HCC)   7. Abdominal wall pain in epigastric region   8. Non-traumatic compression fracture of T2 thoracic vertebra, sequela   9. Non-traumatic compression fracture of T1 thoracic vertebra, sequela   10. Presence of implanted infusion pump (Medtronic)   11. Presence of intrathecal pump (Medtronic)   12. Pharmacologic therapy   13. Encounter for adjustment and management of infusion pump   14. Chronic use  of opiate for therapeutic purpose   15. Therapeutic opioid-induced constipation (OIC)    Pain Assessment: Self-Reported Pain Score: 7 /10 Clinically the patient looks like a 2/10 Reported level is inconsistent with clinical observations.        Review of the patient's program and logs revealed that he is using an average of 3 rescue doses per day.  Occasionally he will use only 2 and sometimes 4, for an average of 3/day.  Most days it looks like he is getting 3 activations per day.  This is probably responsible for his pump calculating a return appointment or we then see volumes of 15 mL left in the pump.  Currently the pump is programmed to allow for 6 activations per day, which is probably responsible for his fairly refill dates.  Pharmacotherapy Assessment  Analgesic: Oxycodone IR 28m, 1 tab PO q 4 hrs (60 mg/day of oxycodone IR) (90 MME) Highest recorded MME/day: 150 mg/day 90 day average MME/day: 632.27 mg/day, according to PMP   Monitoring: [place] PMP: PDMP reviewed during this encounter.       Pharmacotherapy: No side-effects or adverse reactions reported. Compliance: No problems identified. Effectiveness: Clinically acceptable. Plan: Refer to "POC".  UDS: No results found for: SUMMARY  Intrathecal Pump Therapy Assessment  Manufacturer: Medtronic Synchromed Type: Programmable Volume: 40 mL reservoir MRI compatibility: Yes   Drug content:  Primary Medication Class: Opioid Primary Medication: PF-Fentanyl (2000 mcg/mL)  Secondary Medication: PF-Bupivacaine (20 mg/mL)  Other Medication: None   PTM (PCA) mode:  PTM dose: 25 mcg bolus Duration of bolus: 1 minute Lockout interval: 4 hours Maximum 24-hour doses: 6   Programming:  Type: Simple continuous. See pump readout for details.   Changes:  Medication Change:  None at this point Rate Change: No change in rate  Reported side-effects or adverse reactions: None reported  Effectiveness: Described as relatively effective,  allowing for increase in activities of daily living (ADL) Clinically meaningful improvement in function (CMIF): Sustained CMIF goals met  Plan: Pump refill today  Pre-op H&P Assessment:  James [name] is a 70 y.o. (year old), male patient, seen today for interventional treatment. He  has a past surgical history that includes Cholecystectomy (1983); Esophagogastroduodenoscopy (egd) with propofol (N/A, 11/20/2016); Cataract extraction w/ intraocular lens implant (Bilateral); Lower Extremity Angiography (Right, 05/19/2017); Esophagogastroduodenoscopy (egd) with propofol (N/A, 10/27/2017); Colonoscopy with propofol (N/A, 10/27/2017); Lower Extremity Angiography (Left, 08/10/2018); and Intrathecal pump implant (Left, 02/07/2020). James [name] has a current medication list which includes the following prescription(s): AMBULATORY NON FORMULARY MEDICATION, clopidogrel, dexamethasone, ensure, folic acid, gabapentin, ibrutinib, metformin, methocarbamol, mirtazapine, naloxegol oxalate, naloxone, oxycodone hcl, [START ON 11/08/2020] oxycodone hcl, [START ON 12/08/2020] oxycodone hcl, pantoprazole, and oxycodone hcl. His primarily concern today is the Abdominal Pain  Initial Vital Signs:  Pulse/HCG Rate: 83  Temp: (!) 97.1 F (36.2 C) Resp: 14 BP: (!) 146/74 SpO2: 100 %  BMI: Estimated body mass index is 21.29 kg/m as calculated from the following:   Height as of this encounter: 5' 8" (1.727 m).   Weight as of this encounter: 140 lb (63.5 kg).  Risk Assessment: Allergies: Reviewed. He has No Known Allergies.  Allergy Precautions: None required Coagulopathies: Reviewed. None identified.  Blood-thinner therapy: None at this time Active Infection(s): Reviewed. None identified. James [name] is afebrile  Site Confirmation: James [name] was asked to confirm the procedure and laterality before marking the site Procedure checklist: Completed Consent: Before the procedure and under the influence of no sedative(s), amnesic(s), or  anxiolytics, the patient was informed of the treatment options, risks and possible complications. To fulfill our ethical and legal obligations, as recommended by the American Medical Association's Code of Ethics, I have informed the patient of my clinical impression; the nature and purpose of the treatment or procedure; the risks, benefits, and possible complications of the intervention; the alternatives, including doing nothing; the risk(s) and benefit(s) of the alternative treatment(s) or procedure(s); and the risk(s) and benefit(s) of doing nothing.  Mr. Rimel was provided with information about the general risks and possible complications associated with most interventional procedures. These include, but are not limited to: failure to achieve desired goals, infection, bleeding, organ or nerve damage, allergic reactions, paralysis, and/or death.  In addition, he was informed of those risks and possible complications associated to this particular procedure, which include, but are not limited to: damage to the implant; failure to decrease pain; local, systemic, or serious CNS infections, intraspinal abscess with possible cord compression and paralysis, or life-threatening such as meningitis; bleeding; organ damage; nerve injury or damage with subsequent sensory, motor, and/or autonomic system dysfunction, resulting in transient or permanent pain, numbness, and/or weakness of one or several areas of the body; allergic reactions, either minor or major life-threatening, such as anaphylactic or anaphylactoid reactions.  Furthermore, Mr. Mckamie was informed of those risks and complications associated with the medications. These include, but are not limited to: allergic reactions (i.e.: anaphylactic or anaphylactoid reactions); endorphine suppression; bradycardia and/or hypotension; water retention and/or peripheral vascular relaxation leading to lower extremity edema and possible stasis ulcers; respiratory  depression and/or shortness of breath; decreased metabolic rate leading to weight gain; swelling or edema; medication-induced neural toxicity; particulate matter embolism and blood vessel occlusion with resultant organ, and/or nervous  system infarction; and/or intrathecal granuloma formation with possible spinal cord compression and permanent paralysis.  Before refilling the pump Mr. Entwistle was informed that some of the medications used in the devise may not be FDA approved for such use and therefore it constitutes an off-label use of the medications.  Finally, he was informed that Medicine is not an exact science; therefore, there is also the possibility of unforeseen or unpredictable risks and/or possible complications that may result in a catastrophic outcome. The patient indicated having understood very clearly. We have given the patient no guarantees and we have made no promises. Enough time was given to the patient to ask questions, all of which were answered to the patient's satisfaction. Mr. Froman has indicated that he wanted to continue with the procedure. Attestation: I, the ordering provider, attest that I have discussed with the patient the benefits, risks, side-effects, alternatives, likelihood of achieving goals, and potential problems during recovery for the procedure that I have provided informed consent. Date  Time: 10/24/2020  1:00 PM  Pre-Procedure Preparation:  Monitoring: As per clinic protocol. Respiration, ETCO2, SpO2, BP, heart rate and rhythm monitor placed and checked for adequate function Safety Precautions: Patient was assessed for positional comfort and pressure points before starting the procedure. Time-out: I initiated and conducted the "Time-out" before starting the procedure, as per protocol. The patient was asked to participate by confirming the accuracy of the "Time Out" information. Verification of the correct person, site, and procedure were performed and confirmed by  me, the nursing staff, and the patient. "Time-out" conducted as per Joint Commission's Universal Protocol (UP.01.01.01). Time: 1302  Description of Procedure:          Position: Supine Target Area: Central-port of intrathecal pump. Approach: Anterior, 90 degree angle approach. Area Prepped: Entire Area around the pump implant. DuraPrep (Iodine Povacrylex [0.7% available iodine] and Isopropyl Alcohol, 74% w/w) Safety Precautions: Aspiration looking for blood return was conducted prior to all injections. At no point did we inject any substances, as a needle was being advanced. No attempts were made at seeking any paresthesias. Safe injection practices and needle disposal techniques used. Medications properly checked for expiration dates. SDV (single dose vial) medications used. Description of the Procedure: Protocol guidelines were followed. Two nurses trained to do implant refills were present during the entire procedure. The refill medication was checked by both healthcare providers as well as the patient. The patient was included in the "Time-out" to verify the medication. The patient was placed in position. The pump was identified. The area was prepped in the usual manner. The sterile template was positioned over the pump, making sure the side-port location matched that of the pump. Both, the pump and the template were held for stability. The needle provided in the Medtronic Kit was then introduced thru the center of the template and into the central port. The pump content was aspirated and discarded volume documented. The new medication was slowly infused into the pump, thru the filter, making sure to avoid overpressure of the device. The needle was then removed and the area cleansed, making sure to leave some of the prepping solution back to take advantage of its long term bactericidal properties. The pump was interrogated and programmed to reflect the correct medication, volume, and dosage. The  program was printed and taken to the physician for approval. Once checked and signed by the physician, a copy was provided to the patient and another scanned into the EMR. Vitals:   10/24/20 1259  BP: (!) 146/74  Pulse: 83  Resp: 14  Temp: (!) 97.1 F (36.2 C)  TempSrc: Temporal  SpO2: 100%  Weight: 140 lb (63.5 kg)  Height: 5' 8" (1.727 m)    Start Time: 1323 hrs. End Time: 1330 hrs. Materials & Medications: Medtronic Refill Kit Medication(s): Please see chart orders for details.  Imaging Guidance:          Type of Imaging Technique: None used Indication(s): N/A Exposure Time: No patient exposure Contrast: None used. Fluoroscopic Guidance: N/A Ultrasound Guidance: N/A Interpretation: N/A  Antibiotic Prophylaxis:   Anti-infectives (From admission, onward)    None      Indication(s): None identified  Post-operative Assessment:  Post-procedure Vital Signs:  Pulse/HCG Rate: 83  Temp: (!) 97.1 F (36.2 C) Resp: 14 BP: (!) 146/74 SpO2: 100 %  EBL: None  Complications: No immediate post-treatment complications observed by team, or reported by patient.  Note: The patient tolerated the entire procedure well. A repeat set of vitals were taken after the procedure and the patient was kept under observation following institutional policy, for this type of procedure. Post-procedural neurological assessment was performed, showing return to baseline, prior to discharge. The patient was provided with post-procedure discharge instructions, including a section on how to identify potential problems. Should any problems arise concerning this procedure, the patient was given instructions to immediately contact us, at any time, without hesitation. In any case, we plan to contact the patient by telephone for a follow-up status report regarding this interventional procedure.  Comments:  No additional relevant information.  Plan of Care  Orders:  Orders Placed This Encounter   Procedures   PUMP REFILL    Maintain Protocol by having two(2) healthcare providers during procedure and programming.    Scheduling Instructions:     Please refill intrathecal pump today.    Order Specific Question:   Where will this procedure be performed?    Answer:   [facility]   PUMP REFILL    Whenever possible schedule on a procedure today.    Standing Status:   Future    Standing Expiration Date:   03/22/2021    Scheduling Instructions:     Please schedule intrathecal pump refill based on pump programming. Avoid schedule intervals of more than 120 days (4 months).    Order Specific Question:   Where will this procedure be performed?    Answer:   [facility]   Informed Consent Details: Physician/Practitioner Attestation; Transcribe to consent form and obtain patient signature    Transcribe to consent form and obtain patient signature.    Order Specific Question:   Physician/Practitioner attestation of informed consent for procedure/surgical case    Answer:   I, the physician/practitioner, attest that I have discussed with the patient the benefits, risks, side effects, alternatives, likelihood of achieving goals and potential problems during recovery for the procedure that I have provided informed consent.    Order Specific Question:   Procedure    Answer:   Intrathecal pump refill    Order Specific Question:   Physician/Practitioner performing the procedure    Answer:   Attending Physician: Kathlen Brunswick. Dossie Arbour, MD & designated trained staff    Order Specific Question:   Indication/Reason    Answer:   Chronic Pain Syndrome (G89.4), presence of an intrathecal pump (Z97.8)    Chronic Opioid Analgesic:  Oxycodone IR 4m, 1 tab PO q 4 hrs (60 mg/day of oxycodone IR) (90 MME) Highest recorded MME/day: 150  mg/day 90 day average MME/day: 632.27 mg/day, according to PMP   Medications ordered for procedure: No orders of the defined types were placed in this  encounter.  Medications administered: Jarnell F. Osmon had no medications administered during this visit.  See the medical record for exact dosing, route, and time of administration.  Follow-up plan:   Return for Pump Refill (Max:35mo to decrease PCA activations from 6 to 3/day.      Interventional Therapies  Risk  Complexity Considerations:   NOTE: PLAVIX ANTICOAGULATION  (Stop: 7 days  Restart: 2 hrs)   Planned  Pending:   Decrease pump PCA activations from 6/day to 3/day. Therapeutic bilateral splanchnic nerves neurolysis #1 with alcohol  Diagnostic intrathecal catheter test under fluoroscopic guidance to determine location    Under consideration:   Possible revision of intrathecal catheter    Completed:   Diagnostic bilateral celiac plexus block x2 (06/01/2019; 11/02/2019) (50/30/30/0) (75/75/0/0)  Therapeutic bilateral celiac plexus neurolysis with phenol x1 (04/06/2020)  Diagnostic bilateral rectus abdominis trigger point injection x1 (04/06/2020) (0/0/0)  Diagnostic (ML) T12-L1 intrathecal injection of PF-fentanyl 25 mcg as an intrathecal pump trial x1 (12/14/2019)   Therapeutic  Palliative (PRN) options:   Intrathecal pump management    Recent Visits Date Type Provider Dept  09/28/20 Procedure visit NMilinda Pointer MD [facility]  08/29/20 Procedure visit NMilinda Pointer MD [facility]  07/27/20 Procedure visit NMilinda Pointer MD [facility]  Showing recent visits within past 90 days and meeting all other requirements Today's Visits Date Type Provider Dept  10/24/20 Procedure visit NMilinda Pointer MD [facility]  Showing today's visits and meeting all other requirements Future Appointments Date Type Provider Dept  11/28/20 Appointment NMilinda Pointer MD [facility]  Showing future appointments within next 90 days and meeting all other requirements Disposition: Discharge home  Discharge (Date   Time): 10/24/2020; 1345 hrs.   Primary Care Physician: HTracie Harrier MD [facility] Note by: FGaspar Cola MD Date: 10/24/2020; Time: 7:18 PM  Disclaimer:  Medicine is not an eChief Strategy Officer The only guarantee in medicine is that nothing is guaranteed. It is important to note that the decision to proceed with this intervention was based on the information collected from the patient. The Data and conclusions were drawn from the patient's questionnaire, the interview, and the physical examination. Because the information was provided in large part by the patient, it cannot be guaranteed that it has not been purposely or unconsciously manipulated. Every effort has been made to obtain as much relevant data as possible for this evaluation. It is important to note that the conclusions that lead to this procedure are derived in large part from the available data. Always take into account that the treatment will also be dependent on availability of resources and existing treatment guidelines, considered by other Pain Management Practitioners as being common knowledge and practice, at the time of the intervention. For Medico-Legal purposes, it is also important to point out that variation in procedural techniques and pharmacological choices are the acceptable norm. The indications, contraindications, technique, and results of the above procedure should only be interpreted and judged by a Board-Certified Interventional Pain Specialist with extensive familiarity and expertise in the same exact procedure and technique.

## 2020-10-24 ENCOUNTER — Encounter: Payer: Self-pay | Admitting: Pain Medicine

## 2020-10-24 ENCOUNTER — Other Ambulatory Visit: Payer: Self-pay

## 2020-10-24 ENCOUNTER — Other Ambulatory Visit (HOSPITAL_COMMUNITY): Payer: Self-pay

## 2020-10-24 ENCOUNTER — Ambulatory Visit: Payer: Medicare Other | Attending: Pain Medicine | Admitting: Pain Medicine

## 2020-10-24 VITALS — BP 146/74 | HR 83 | Temp 97.1°F | Resp 14 | Ht 68.0 in | Wt 140.0 lb

## 2020-10-24 DIAGNOSIS — X58XXXS Exposure to other specified factors, sequela: Secondary | ICD-10-CM | POA: Insufficient documentation

## 2020-10-24 DIAGNOSIS — M4854XS Collapsed vertebra, not elsewhere classified, thoracic region, sequela of fracture: Secondary | ICD-10-CM | POA: Insufficient documentation

## 2020-10-24 DIAGNOSIS — G894 Chronic pain syndrome: Secondary | ICD-10-CM | POA: Diagnosis present

## 2020-10-24 DIAGNOSIS — Z7902 Long term (current) use of antithrombotics/antiplatelets: Secondary | ICD-10-CM | POA: Diagnosis not present

## 2020-10-24 DIAGNOSIS — T402X5A Adverse effect of other opioids, initial encounter: Secondary | ICD-10-CM | POA: Insufficient documentation

## 2020-10-24 DIAGNOSIS — X58XXXA Exposure to other specified factors, initial encounter: Secondary | ICD-10-CM | POA: Diagnosis not present

## 2020-10-24 DIAGNOSIS — Z978 Presence of other specified devices: Secondary | ICD-10-CM

## 2020-10-24 DIAGNOSIS — C8590 Non-Hodgkin lymphoma, unspecified, unspecified site: Secondary | ICD-10-CM | POA: Insufficient documentation

## 2020-10-24 DIAGNOSIS — Z451 Encounter for adjustment and management of infusion pump: Secondary | ICD-10-CM | POA: Diagnosis not present

## 2020-10-24 DIAGNOSIS — Z79899 Other long term (current) drug therapy: Secondary | ICD-10-CM | POA: Insufficient documentation

## 2020-10-24 DIAGNOSIS — C8300 Small cell B-cell lymphoma, unspecified site: Secondary | ICD-10-CM

## 2020-10-24 DIAGNOSIS — Z9689 Presence of other specified functional implants: Secondary | ICD-10-CM | POA: Diagnosis not present

## 2020-10-24 DIAGNOSIS — Z79891 Long term (current) use of opiate analgesic: Secondary | ICD-10-CM

## 2020-10-24 DIAGNOSIS — G893 Neoplasm related pain (acute) (chronic): Secondary | ICD-10-CM | POA: Insufficient documentation

## 2020-10-24 DIAGNOSIS — C9110 Chronic lymphocytic leukemia of B-cell type not having achieved remission: Secondary | ICD-10-CM | POA: Diagnosis not present

## 2020-10-24 DIAGNOSIS — G8929 Other chronic pain: Secondary | ICD-10-CM

## 2020-10-24 DIAGNOSIS — R1013 Epigastric pain: Secondary | ICD-10-CM | POA: Insufficient documentation

## 2020-10-24 DIAGNOSIS — K5903 Drug induced constipation: Secondary | ICD-10-CM | POA: Insufficient documentation

## 2020-10-24 DIAGNOSIS — Z95828 Presence of other vascular implants and grafts: Secondary | ICD-10-CM

## 2020-10-24 NOTE — Progress Notes (Signed)
Safety precautions to be maintained throughout the outpatient stay will include: orient to surroundings, keep bed in low position, maintain call bell within reach at all times, provide assistance with transfer out of bed and ambulation.   There was no identifying sticker on the medication syringe from [employer]. I called the pharmacy, spoke with a pharmacist, he told me that all the syringes are processed one at a time, so there is no way the medication could not be what the label on the wrapper indicated.

## 2020-10-24 NOTE — Patient Instructions (Addendum)
____________________________________________________________________________________________  Medication Rules  Purpose: To inform patients, and their family members, of our rules and regulations.  Applies to: All patients receiving prescriptions (written or electronic).  Pharmacy of record: Pharmacy where electronic prescriptions will be sent. If written prescriptions are taken to a different pharmacy, please inform the nursing staff. The pharmacy listed in the electronic medical record should be the one where you would like electronic prescriptions to be sent.  Electronic prescriptions: In compliance with the [place] Strengthen Opioid Misuse Prevention (STOP) Act of 2017 (Session Law 2017-74/H243), effective April 29, 2018, all controlled substances must be electronically prescribed. Calling prescriptions to the pharmacy will cease to exist.  Prescription refills: Only during scheduled appointments. Applies to all prescriptions.  NOTE: The following applies primarily to controlled substances (Opioid* Pain Medications).   Type of encounter (visit): For patients receiving controlled substances, face-to-face visits are required. (Not an option or up to the patient.)  Patient's responsibilities: Pain Pills: Bring all pain pills to every appointment (except for procedure appointments). Pill Bottles: Bring pills in original pharmacy bottle. Always bring the newest bottle. Bring bottle, even if empty. Medication refills: You are responsible for knowing and keeping track of what medications you take and those you need refilled. The day before your appointment: write a list of all prescriptions that need to be refilled. The day of the appointment: give the list to the admitting nurse. Prescriptions will be written only during appointments. No prescriptions will be written on procedure days. If you forget a medication: it will not be "Called in", "Faxed", or "electronically sent". You will  need to get another appointment to get these prescribed. No early refills. Do not call asking to have your prescription filled early. Prescription Accuracy: You are responsible for carefully inspecting your prescriptions before leaving our office. Have the discharge nurse carefully go over each prescription with you, before taking them home. Make sure that your name is accurately spelled, that your address is correct. Check the name and dose of your medication to make sure it is accurate. Check the number of pills, and the written instructions to make sure they are clear and accurate. Make sure that you are given enough medication to last until your next medication refill appointment. Taking Medication: Take medication as prescribed. When it comes to controlled substances, taking less pills or less frequently than prescribed is permitted and encouraged. Never take more pills than instructed. Never take medication more frequently than prescribed.  Inform other Doctors: Always inform, all of your healthcare providers, of all the medications you take. Pain Medication from other Providers: You are not allowed to accept any additional pain medication from any other Doctor or Healthcare provider. There are two exceptions to this rule. (see below) In the event that you require additional pain medication, you are responsible for notifying us, as stated below. Cough Medicine: Often these contain an opioid, such as codeine or hydrocodone. Never accept or take cough medicine containing these opioids if you are already taking an opioid* medication. The combination may cause respiratory failure and death. Medication Agreement: You are responsible for carefully reading and following our Medication Agreement. This must be signed before receiving any prescriptions from our practice. Safely store a copy of your signed Agreement. Violations to the Agreement will result in no further prescriptions. (Additional copies of our  Medication Agreement are available upon request.) Laws, Rules, & Regulations: All patients are expected to follow all Federal and State Laws, Statutes, Rules, & Regulations. Ignorance of   the Laws does not constitute a valid excuse.  Illegal drugs and Controlled Substances: The use of illegal substances (including, but not limited to marijuana and its derivatives) and/or the illegal use of any controlled substances is strictly prohibited. Violation of this rule may result in the immediate and permanent discontinuation of any and all prescriptions being written by our practice. The use of any illegal substances is prohibited. Adopted CDC guidelines & recommendations: Target dosing levels will be at or below 60 MME/day. Use of benzodiazepines** is not recommended.  Exceptions: There are only two exceptions to the rule of not receiving pain medications from other Healthcare Providers. Exception #1 (Emergencies): In the event of an emergency (i.e.: accident requiring emergency care), you are allowed to receive additional pain medication. However, you are responsible for: As soon as you are able, call our office [phone], at any time of the day or night, and leave a message stating your name, the date and nature of the emergency, and the name and dose of the medication prescribed. In the event that your call is answered by a member of our staff, make sure to document and save the date, time, and the name of the person that took your information.  Exception #2 (Planned Surgery): In the event that you are scheduled by another doctor or dentist to have any type of surgery or procedure, you are allowed (for a period no longer than 30 days), to receive additional pain medication, for the acute post-op pain. However, in this case, you are responsible for picking up a copy of our "Post-op Pain Management for Surgeons" handout, and giving it to your surgeon or dentist. This document is available at our office, and  does not require an appointment to obtain it. Simply go to our office during business hours (Monday-Thursday from 8:00 AM to 4:00 PM) (Friday 8:00 AM to 12:00 Noon) or if you have a scheduled appointment with us, prior to your surgery, and ask for it by name. In addition, you are responsible for: calling our office [phone], at any time of the day or night, and leaving a message stating your name, name of your surgeon, type of surgery, and date of procedure or surgery. Failure to comply with your responsibilities may result in termination of therapy involving the controlled substances.  *Opioid medications include: morphine, codeine, oxycodone, oxymorphone, hydrocodone, hydromorphone, meperidine, tramadol, tapentadol, buprenorphine, fentanyl, methadone. **Benzodiazepine medications include: diazepam (Valium), alprazolam (Xanax), clonazepam (Klonopine), lorazepam (Ativan), clorazepate (Tranxene), chlordiazepoxide (Librium), estazolam (Prosom), oxazepam (Serax), temazepam (Restoril), triazolam (Halcion) (Last updated: 03/27/2020) ____________________________________________________________________________________________  ____________________________________________________________________________________________  Medication Recommendations and Reminders  Applies to: All patients receiving prescriptions (written and/or electronic).  Medication Rules & Regulations: These rules and regulations exist for your safety and that of others. They are not flexible and neither are we. Dismissing or ignoring them will be considered "non-compliance" with medication therapy, resulting in complete and irreversible termination of such therapy. (See document titled "Medication Rules" for more details.) In all conscience, because of safety reasons, we cannot continue providing a therapy where the patient does not follow instructions.  Pharmacy of record:  Definition: This is the pharmacy where your electronic  prescriptions will be sent.  We do not endorse any particular pharmacy, however, we have experienced problems with [facility] not securing enough medication supply for the community. We do not restrict you in your choice of pharmacy. However, once we write for your prescriptions, we will NOT be re-sending more prescriptions to fix restricted supply problems   created by your pharmacy, or your insurance.  The pharmacy listed in the electronic medical record should be the one where you want electronic prescriptions to be sent. If you choose to change pharmacy, simply notify our nursing staff.  Recommendations: Keep all of your pain medications in a safe place, under lock and key, even if you live alone. We will NOT replace lost, stolen, or damaged medication. After you fill your prescription, take 1 week's worth of pills and put them away in a safe place. You should keep a separate, properly labeled bottle for this purpose. The remainder should be kept in the original bottle. Use this as your primary supply, until it runs out. Once it's gone, then you know that you have 1 week's worth of medicine, and it is time to come in for a prescription refill. If you do this correctly, it is unlikely that you will ever run out of medicine. To make sure that the above recommendation works, it is very important that you make sure your medication refill appointments are scheduled at least 1 week before you run out of medicine. To do this in an effective manner, make sure that you do not leave the office without scheduling your next medication management appointment. Always ask the nursing staff to show you in your prescription , when your medication will be running out. Then arrange for the receptionist to get you a return appointment, at least 7 days before you run out of medicine. Do not wait until you have 1 or 2 pills left, to come in. This is very poor planning and does not take into consideration that we may need to  cancel appointments due to bad weather, sickness, or emergencies affecting our staff. DO NOT ACCEPT A "Partial Fill": If for any reason your pharmacy does not have enough pills/tablets to completely fill or refill your prescription, do not allow for a "partial fill". The law allows the pharmacy to complete that prescription within 72 hours, without requiring a new prescription. If they do not fill the rest of your prescription within those 72 hours, you will need a separate prescription to fill the remaining amount, which we will NOT provide. If the reason for the partial fill is your insurance, you will need to talk to the pharmacist about payment alternatives for the remaining tablets, but again, DO NOT ACCEPT A PARTIAL FILL, unless you can trust your pharmacist to obtain the remainder of the pills within 72 hours.  Prescription refills and/or changes in medication(s):  Prescription refills, and/or changes in dose or medication, will be conducted only during scheduled medication management appointments. (Applies to both, written and electronic prescriptions.) No refills on procedure days. No medication will be changed or started on procedure days. No changes, adjustments, and/or refills will be conducted on a procedure day. Doing so will interfere with the diagnostic portion of the procedure. No phone refills. No medications will be "called into the pharmacy". No Fax refills. No weekend refills. No Holliday refills. No after hours refills.  Remember:  Business hours are:  Monday to Thursday 8:00 AM to 4:00 PM Provider's Schedule: Francisco Naveira, MD - Appointments are:  Medication management: Monday and Wednesday 8:00 AM to 4:00 PM Procedure day: Tuesday and Thursday 7:30 AM to 4:00 PM Bilal Lateef, MD - Appointments are:  Medication management: Tuesday and Thursday 8:00 AM to 4:00 PM Procedure day: Monday and Wednesday 7:30 AM to 4:00 PM (Last update:  11/17/2019) ____________________________________________________________________________________________  ____________________________________________________________________________________________  CBD (cannabidiol) WARNING    Applicable to: All individuals currently taking or considering taking CBD (cannabidiol) and, more important, all patients taking opioid analgesic controlled substances (pain medication). (Example: oxycodone; oxymorphone; hydrocodone; hydromorphone; morphine; methadone; tramadol; tapentadol; fentanyl; buprenorphine; butorphanol; dextromethorphan; meperidine; codeine; etc.)  Legal status: CBD remains a Schedule I drug prohibited for any use. CBD is illegal with one exception. In the [place], CBD has a limited Food and Drug Administration (FDA) approval for the treatment of two specific types of epilepsy disorders. Only one CBD product has been approved by the FDA for this purpose: "Epidiolex". FDA is aware that some companies are marketing products containing cannabis and cannabis-derived compounds in ways that violate the Federal Food, Drug and Cosmetic Act (FD&C Act) and that may put the health and safety of consumers at risk. The FDA, a Federal agency, has not enforced the CBD status since 2018.   Legality: Some manufacturers ship CBD products nationally, which is illegal. Often such products are sold online and are therefore available throughout the country. CBD is openly sold in head shops and health food stores in some states where such sales have not been explicitly legalized. Selling unapproved products with unsubstantiated therapeutic claims is not only a violation of the law, but also can put patients at risk, as these products have not been proven to be safe or effective. Federal illegality makes it difficult to conduct research on CBD.  Reference: "FDA Regulation of Cannabis and Cannabis-Derived Products, Including Cannabidiol (CBD)" -  https://www.fda.gov/news-events/public-health-focus/fda-regulation-cannabis-and-cannabis-derived-products-including-cannabidiol-cbd  Warning: CBD is not FDA approved and has not undergo the same manufacturing controls as prescription drugs.  This means that the purity and safety of available CBD may be questionable. Most of the time, despite manufacturer's claims, it is contaminated with THC (delta-9-tetrahydrocannabinol - the chemical in marijuana responsible for the "HIGH").  When this is the case, the THC contaminant will trigger a positive urine drug screen (UDS) test for Marijuana (carboxy-THC). Because a positive UDS for any illicit substance is a violation of our medication agreement, your opioid analgesics (pain medicine) may be permanently discontinued.  MORE ABOUT CBD  General Information: CBD  is a derivative of the Marijuana (cannabis sativa) plant discovered in 1940. It is one of the 113 identified substances found in Marijuana. It accounts for up to 40% of the plant's extract. As of 2018, preliminary clinical studies on CBD included research for the treatment of anxiety, movement disorders, and pain. CBD is available and consumed in multiple forms, including inhalation of smoke or vapor, as an aerosol spray, and by mouth. It may be supplied as an oil containing CBD, capsules, dried cannabis, or as a liquid solution. CBD is thought not to be as psychoactive as THC (delta-9-tetrahydrocannabinol - the chemical in marijuana responsible for the "HIGH"). Studies suggest that CBD may interact with different biological target receptors in the body, including cannabinoid and other neurotransmitter receptors. As of 2018 the mechanism of action for its biological effects has not been determined.  Side-effects  Adverse reactions: Dry mouth, diarrhea, decreased appetite, fatigue, drowsiness, malaise, weakness, sleep disturbances, and others.  Drug interactions: CBC may interact with other medications  such as blood-thinners. (Last update: 12/04/2019) ____________________________________________________________________________________________  ____________________________________________________________________________________________  Drug Holidays (Slow)  What is a "Drug Holiday"? Drug Holiday: is the name given to the period of time during which a patient stops taking a medication(s) for the purpose of eliminating tolerance to the drug.  Benefits Improved effectiveness of opioids. Decreased opioid dose needed to achieve benefits. Improved pain with lesser dose.    What is tolerance? Tolerance: is the progressive decreased in effectiveness of a drug due to its repetitive use. With repetitive use, the body gets use to the medication and as a consequence, it loses its effectiveness. This is a common problem seen with opioid pain medications. As a result, a larger dose of the drug is needed to achieve the same effect that used to be obtained with a smaller dose.  How long should a "Drug Holiday" last? You should stay off of the pain medicine for at least 14 consecutive days. (2 weeks)  Should I stop the medicine "cold [place]"? No. You should always coordinate with your Pain Specialist so that he/she can provide you with the correct medication dose to make the transition as smoothly as possible.  How do I stop the medicine? Slowly. You will be instructed to decrease the daily amount of pills that you take by one (1) pill every seven (7) days. This is called a "slow downward taper" of your dose. For example: if you normally take four (4) pills per day, you will be asked to drop this dose to three (3) pills per day for seven (7) days, then to two (2) pills per day for seven (7) days, then to one (1) per day for seven (7) days, and at the end of those last seven (7) days, this is when the "Drug Holiday" would start.   Will I have withdrawals? By doing a "slow downward taper" like this one, it  is unlikely that you will experience any significant withdrawal symptoms. Typically, what triggers withdrawals is the sudden stop of a high dose opioid therapy. Withdrawals can usually be avoided by slowly decreasing the dose over a prolonged period of time. If you do not follow these instructions and decide to stop your medication abruptly, withdrawals may be possible.  What are withdrawals? Withdrawals: refers to the wide range of symptoms that occur after stopping or dramatically reducing opiate drugs after heavy and prolonged use. Withdrawal symptoms do not occur to patients that use low dose opioids, or those who take the medication sporadically. Contrary to benzodiazepine (example: Valium, Xanax, etc.) or alcohol withdrawals ("Delirium Tremens"), opioid withdrawals are not lethal. Withdrawals are the physical manifestation of the body getting rid of the excess receptors.  Expected Symptoms Early symptoms of withdrawal may include: Agitation Anxiety Muscle aches Increased tearing Insomnia Runny nose Sweating Yawning  Late symptoms of withdrawal may include: Abdominal cramping Diarrhea Dilated pupils Goose bumps Nausea Vomiting  Will I experience withdrawals? Due to the slow nature of the taper, it is very unlikely that you will experience any.  What is a slow taper? Taper: refers to the gradual decrease in dose.  (Last update: 11/17/2019) ____________________________________________________________________________________________   Has Narcan at home, patient and son both verbalized that they know how to use it.

## 2020-10-25 ENCOUNTER — Telehealth: Payer: Self-pay

## 2020-10-25 MED FILL — Medication: INTRATHECAL | Qty: 1 | Status: AC

## 2020-10-25 NOTE — Telephone Encounter (Signed)
Called PP no answer, unable to leave message. Mailbox is full.

## 2020-10-26 ENCOUNTER — Other Ambulatory Visit (HOSPITAL_COMMUNITY): Payer: Self-pay

## 2020-10-31 ENCOUNTER — Encounter: Payer: Medicare Other | Admitting: Pain Medicine

## 2020-11-01 ENCOUNTER — Other Ambulatory Visit: Payer: Self-pay

## 2020-11-01 ENCOUNTER — Encounter: Payer: Medicare Other | Admitting: Pain Medicine

## 2020-11-01 MED ORDER — PAIN MANAGEMENT IT PUMP REFILL: 1.0000 | Freq: Once | INTRATHECAL | 0 refills | Status: AC

## 2020-11-01 NOTE — Progress Notes (Signed)
It pump  

## 2020-11-03 ENCOUNTER — Inpatient Hospital Stay: Payer: Medicare Other

## 2020-11-03 ENCOUNTER — Other Ambulatory Visit: Payer: Self-pay

## 2020-11-03 ENCOUNTER — Inpatient Hospital Stay (HOSPITAL_BASED_OUTPATIENT_CLINIC_OR_DEPARTMENT_OTHER): Payer: Medicare Other | Admitting: Internal Medicine

## 2020-11-03 ENCOUNTER — Inpatient Hospital Stay: Payer: Medicare Other | Attending: Internal Medicine

## 2020-11-03 ENCOUNTER — Other Ambulatory Visit: Payer: Self-pay | Admitting: *Deleted

## 2020-11-03 ENCOUNTER — Encounter: Payer: Self-pay | Admitting: Internal Medicine

## 2020-11-03 VITALS — BP 140/71 | HR 67 | Temp 97.8°F | Resp 18

## 2020-11-03 DIAGNOSIS — C9110 Chronic lymphocytic leukemia of B-cell type not having achieved remission: Secondary | ICD-10-CM

## 2020-11-03 DIAGNOSIS — D649 Anemia, unspecified: Secondary | ICD-10-CM

## 2020-11-03 DIAGNOSIS — R5383 Other fatigue: Secondary | ICD-10-CM

## 2020-11-03 LAB — CBC WITH DIFFERENTIAL/PLATELET
Abs Immature Granulocytes: 0.02 10*3/uL (ref 0.00–0.07)
Basophils Absolute: 0.1 10*3/uL (ref 0.0–0.1)
Basophils Relative: 1 %
Eosinophils Absolute: 0.2 10*3/uL (ref 0.0–0.5)
Eosinophils Relative: 1 %
HCT: 23.6 % — ABNORMAL LOW (ref 39.0–52.0)
Hemoglobin: 7.5 g/dL — ABNORMAL LOW (ref 13.0–17.0)
Immature Granulocytes: 0 %
Lymphocytes Relative: 75 %
Lymphs Abs: 12.3 10*3/uL — ABNORMAL HIGH (ref 0.7–4.0)
MCH: 33 pg (ref 26.0–34.0)
MCHC: 31.8 g/dL (ref 30.0–36.0)
MCV: 104 fL — ABNORMAL HIGH (ref 80.0–100.0)
Monocytes Absolute: 1.7 10*3/uL — ABNORMAL HIGH (ref 0.1–1.0)
Monocytes Relative: 10 %
Neutro Abs: 2.2 10*3/uL (ref 1.7–7.7)
Neutrophils Relative %: 13 %
Platelets: 227 10*3/uL (ref 150–400)
RBC: 2.27 MIL/uL — ABNORMAL LOW (ref 4.22–5.81)
RDW: 24.6 % — ABNORMAL HIGH (ref 11.5–15.5)
Smear Review: NORMAL
WBC: 16.5 10*3/uL — ABNORMAL HIGH (ref 4.0–10.5)
nRBC: 0 % (ref 0.0–0.2)

## 2020-11-03 LAB — COMPREHENSIVE METABOLIC PANEL
ALT: 11 U/L (ref 0–44)
AST: 16 U/L (ref 15–41)
Albumin: 4.3 g/dL (ref 3.5–5.0)
Alkaline Phosphatase: 80 U/L (ref 38–126)
Anion gap: 12 (ref 5–15)
BUN: 28 mg/dL — ABNORMAL HIGH (ref 8–23)
CO2: 24 mmol/L (ref 22–32)
Calcium: 9.2 mg/dL (ref 8.9–10.3)
Chloride: 103 mmol/L (ref 98–111)
Creatinine, Ser: 1.21 mg/dL (ref 0.61–1.24)
GFR, Estimated: >60 mL/min (ref >60)
Glucose, Bld: 136 mg/dL — ABNORMAL HIGH (ref 70–99)
Potassium: 5.2 mmol/L — ABNORMAL HIGH (ref 3.5–5.1)
Sodium: 139 mmol/L (ref 135–145)
Total Bilirubin: 1.2 mg/dL (ref 0.3–1.2)
Total Protein: 6.9 g/dL (ref 6.5–8.1)

## 2020-11-03 LAB — SAMPLE TO BLOOD BANK

## 2020-11-03 LAB — PREPARE RBC (CROSSMATCH)

## 2020-11-03 LAB — LIPASE, BLOOD: Lipase: 24 U/L (ref 11–51)

## 2020-11-03 LAB — AMYLASE: Amylase: 16 U/L — ABNORMAL LOW (ref 28–100)

## 2020-11-03 LAB — LACTATE DEHYDROGENASE: LDH: 146 U/L (ref 98–192)

## 2020-11-03 MED ORDER — SODIUM CHLORIDE 0.9 % IV SOLN
10.0000 mg | Freq: Once | INTRAVENOUS | Status: DC
  Filled 2020-11-03: qty 1

## 2020-11-03 MED ORDER — ACETAMINOPHEN 325 MG PO TABS
650.0000 mg | ORAL_TABLET | Freq: Once | ORAL | Status: AC
  Administered 2020-11-03: 650 mg via ORAL
  Filled 2020-11-03: qty 2

## 2020-11-03 MED ORDER — DEXAMETHASONE SODIUM PHOSPHATE 10 MG/ML IJ SOLN
10.0000 mg | Freq: Once | INTRAMUSCULAR | Status: AC
  Administered 2020-11-03: 10 mg via INTRAVENOUS
  Filled 2020-11-03: qty 1

## 2020-11-03 MED ORDER — SODIUM CHLORIDE 0.9% IV SOLUTION
250.0000 mL | Freq: Once | INTRAVENOUS | Status: AC
  Administered 2020-11-03: 250 mL via INTRAVENOUS
  Filled 2020-11-03: qty 250

## 2020-11-03 MED ORDER — DIPHENHYDRAMINE HCL 25 MG PO CAPS
25.0000 mg | ORAL_CAPSULE | Freq: Once | ORAL | Status: AC
  Administered 2020-11-03: 25 mg via ORAL
  Filled 2020-11-03: qty 1

## 2020-11-03 NOTE — Patient Instructions (Signed)
[facility]  Discharge Instructions: Thank you for choosing [facility] to provide your oncology and hematology care.  If you have a lab appointment with the [facility], please go directly to the [facility] and check in at the registration area.  Wear comfortable clothing and clothing appropriate for easy access to any Portacath or PICC line.   We strive to give you quality time with your provider. You may need to reschedule your appointment if you arrive late (15 or more minutes).  Arriving late affects you and other patients whose appointments are after yours.  Also, if you miss three or more appointments without notifying the office, you may be dismissed from the clinic at the provider's discretion.      For prescription refill requests, have your pharmacy contact our office and allow 72 hours for refills to be completed.    Today you received the following : blood transfusion     To help prevent nausea and vomiting after your treatment, we encourage you to take your nausea medication as directed.  BELOW ARE SYMPTOMS THAT SHOULD BE REPORTED IMMEDIATELY: *FEVER GREATER THAN 100.4 F (38 C) OR HIGHER *CHILLS OR SWEATING *NAUSEA AND VOMITING THAT IS NOT CONTROLLED WITH YOUR NAUSEA MEDICATION *UNUSUAL SHORTNESS OF BREATH *UNUSUAL BRUISING OR BLEEDING *URINARY PROBLEMS (pain or burning when urinating, or frequent urination) *BOWEL PROBLEMS (unusual diarrhea, constipation, pain near the anus) TENDERNESS IN MOUTH AND THROAT WITH OR WITHOUT PRESENCE OF ULCERS (sore throat, sores in mouth, or a toothache) UNUSUAL RASH, SWELLING OR PAIN  UNUSUAL VAGINAL DISCHARGE OR ITCHING   Items with * indicate a potential emergency and should be followed up as soon as possible or go to the Emergency Department if any problems should occur.  Please show the CHEMOTHERAPY ALERT CARD or IMMUNOTHERAPY ALERT CARD at check-in to the Emergency Department and  triage nurse.  Should you have questions after your visit or need to cancel or reschedule your appointment, please contact [place]  [phone] and follow the prompts.  Office hours are 8:00 a.m. to 4:30 p.m. Monday - Friday. Please note that voicemails left after 4:00 p.m. may not be returned until the following business day.  We are closed weekends and major holidays. You have access to a nurse at all times for urgent questions. Please call the main number to the clinic [phone] and follow the prompts.  For any non-urgent questions, you may also contact your provider using MyChart. We now offer e-Visits for anyone 76 and older to request care online for non-urgent symptoms. For details visit [email or web address].   Also download the MyChart app! Go to the app store, search "MyChart", open the app, select [facility], and log in with your MyChart username and password.  Due to Covid, a mask is required upon entering the hospital/clinic. If you do not have a mask, one will be given to you upon arrival. For doctor visits, patients may have 1 support person aged 60 or older with them. For treatment visits, patients cannot have anyone with them due to current Covid guidelines and our immunocompromised population.

## 2020-11-03 NOTE — Progress Notes (Signed)
[facility] [place] OFFICE PROGRESS NOTE  Patient Care Team: [name], MD as PCP - General (Internal Medicine) Cammie Sickle, MD as Consulting Physician (Hematology and Oncology) Borders, Kirt Boys, NP as Nurse Practitioner (Hospice and Palliative Medicine) Verlon Au, NP as Nurse Practitioner (Hematology and Oncology) Jacquelin Hawking, NP as Nurse Practitioner (Oncology) Lonell Face, NP as Nurse Practitioner (Neurosurgery) Milinda Pointer, MD as Consulting Physician (Pain Medicine)  Cancer Staging No matching staging information was found for the patient.   Oncology History Overview Note  # 2006- CLL STAGE IV; MAY 2011- WBC- 57K;Platelets-99;Hb-12/CT Bulky LN; BMBx- 80% Invol; del 11; START Benda-Ritux x4 [finished Sep 2011];   # July 2015-Progression; Sep 2015-START ibrutinib; CT scan DEC 2015- Improvement LN; Cont Ibrutinib 150m/d; NOV 2016 CT- 1-2CM LN [mild progression compared to Dec 2015];NOV 2016- FISH peripheral blood- NO MUTATIONS/CD-38 Positive; NOV 7th- CONT IBRUTINIB 2 pills/day; CT AUG 2017- STABLE;  DEC 6th PET- Mild RP LN/ Retrocrural LN  # OFF ibrutinib [? intol]- sep 2019- Jan 16th 2020; Re-start Ibrutinib; September 2020-stop ibrutinib [poor tolerance/worsening anemia]  # Jan 16th 2020- start aranesp; HOLD while on GMineral   # MARCH 11th 2021- Gazyva x 6 finished AUG 2021.   #April May 2022-worsening anemia- BMBx- CLL; cytogenetics: 20 q del [incidental]; rFISH- 17p/11q DEL  # Early May 2022- START IBRUTINIB 420 mg/day  # SEP 2021- prostate enlargement on CT scan- UA-NEG; PSA- WNL; s/p Dr.Wolff.    # DEC 2019- PAIN CONTRACT  # PALLIATIVE CARE- 06/21/2019-  # October 2019-bone marrow biopsy [worsening anemia]-question dyserythropoietic changes/small clone of CLL;  # [facility] HEM- NEG.  SA skin infection [Oct 2016] s/p clinda -------------------------------------------------------    DIAGNOSIS:  CLL  STAGE:   IV      ;GOALS: palliative  CURRENT/MOST RECENT THERAPY : Ibrutinib    [place]  07/08/2019 -  Chemotherapy   The patient had obinutuzumab (GAZYVA) 100 mg in sodium chloride 0.9 % 100 mL (0.9615 mg/mL) chemo infusion, 100 mg, Intravenous, Once, 6 of 6 cycles Administration: 100 mg (07/08/2019), 900 mg (07/09/2019), 1,000 mg (07/26/2019), 1,000 mg (08/16/2019), 1,000 mg (08/02/2019), 1,000 mg (09/13/2019), 1,000 mg (10/11/2019), 1,000 mg (11/09/2019), 1,000 mg (12/07/2019)   for chemotherapy treatment.       INTERVAL HISTORY:  James SCHOENBERGER89y.o.  male pleasant patient above history of CLL and chronic abdominal pain of unclear etiology; severe anemia of unclear etiology is here for follow-up.  Patient was recently seen in the symptom management clinic-taken off ibrutinib given poor tolerance/worsening anemia.  Continues to have abdominal pain.  Positive for nausea.  No vomiting.  No constipation diarrhea.  He continues to have pain pump followed by pain clinic.  Review of Systems  Constitutional:  Positive for malaise/fatigue. Negative for chills, diaphoresis and fever.  HENT:  Negative for nosebleeds and sore throat.   Eyes:  Negative for double vision.  Respiratory:  Positive for shortness of breath. Negative for cough, hemoptysis, sputum production and wheezing.   Cardiovascular:  Negative for chest pain, palpitations, orthopnea and leg swelling.  Gastrointestinal:  Positive for abdominal pain, constipation and nausea. Negative for blood in stool, diarrhea, heartburn, melena and vomiting.  Genitourinary:  Negative for dysuria, frequency and urgency.  Skin: Negative.  Negative for itching and rash.  Neurological:  Negative for dizziness, tingling, focal weakness, weakness and headaches.  Endo/Heme/Allergies:  Does not bruise/bleed easily.  Psychiatric/Behavioral:  Negative for depression. The patient  is not nervous/anxious and does not have  insomnia.      PAST MEDICAL HISTORY :  Past Medical History:  Diagnosis Date  . CLL (chronic lymphocytic leukemia) ([place])   . Constipation   . Depression   . Diabetes mellitus without complication ([place])   . GERD (gastroesophageal reflux disease)   . Hematuria   . Hyperlipidemia   . Hypertension   . Therapeutic opioid induced constipation     PAST SURGICAL HISTORY :   Past Surgical History:  Procedure Laterality Date  . CATARACT EXTRACTION W/ INTRAOCULAR LENS IMPLANT Bilateral   . CHOLECYSTECTOMY  1983  . COLONOSCOPY WITH PROPOFOL N/A 10/27/2017   Procedure: COLONOSCOPY WITH PROPOFOL;  Surgeon: Manya Silvas, MD;  Location: [facility] ENDOSCOPY;  Service: Endoscopy;  Laterality: N/A;  . ESOPHAGOGASTRODUODENOSCOPY (EGD) WITH PROPOFOL N/A 11/20/2016   Procedure: ESOPHAGOGASTRODUODENOSCOPY (EGD) WITH PROPOFOL;  Surgeon: Manya Silvas, MD;  Location: [facility] ENDOSCOPY;  Service: Endoscopy;  Laterality: N/A;  . ESOPHAGOGASTRODUODENOSCOPY (EGD) WITH PROPOFOL N/A 10/27/2017   Procedure: ESOPHAGOGASTRODUODENOSCOPY (EGD) WITH PROPOFOL;  Surgeon: Manya Silvas, MD;  Location: [facility] ENDOSCOPY;  Service: Endoscopy;  Laterality: N/A;  . INTRATHECAL PUMP IMPLANT Left 02/07/2020   Procedure: INTRATHECAL PUMP & CATHETER IMPLANT;  Surgeon: Deetta Perla, MD;  Location: [facility] ORS;  Service: Neurosurgery;  Laterality: Left;  . LOWER EXTREMITY ANGIOGRAPHY Right 05/19/2017   Procedure: LOWER EXTREMITY ANGIOGRAPHY;  Surgeon: Algernon Huxley, MD;  Location: [place] CV LAB;  Service: Cardiovascular;  Laterality: Right;  . LOWER EXTREMITY ANGIOGRAPHY Left 08/10/2018   Procedure: LOWER EXTREMITY ANGIOGRAPHY;  Surgeon: Algernon Huxley, MD;  Location: [place] CV LAB;  Service: Cardiovascular;  Laterality: Left;    FAMILY HISTORY :   Family History  Problem Relation Age of Onset  . Hypertension Sister     SOCIAL HISTORY:   Social History   Tobacco Use  . Smoking status: Every Day    Packs/day: 0.50     Years: 47.00    Pack years: 23.50    Types: Cigarettes  . Smokeless tobacco: Never  Vaping Use  . Vaping Use: Never used  Substance Use Topics  . Alcohol use: Yes    Alcohol/week: 1.0 standard drink    Types: 1 Glasses of wine per week    Comment:  almost none in last 6 months  . Drug use: No    ALLERGIES:  has No Known Allergies.  MEDICATIONS:  Current Outpatient Medications  Medication Sig Dispense Refill  . AMBULATORY NON FORMULARY MEDICATION Medication Name: Intrathecal pump Bupivacaine 20.0 mg/ml Fentanyl 2,000.0 mcg/ml Dose 1846.4 mcg/day    . clopidogrel (PLAVIX) 75 MG tablet Take 75 mg by mouth daily.     Marland Kitchen dexamethasone (DECADRON) 4 MG tablet Take 1 tablet (4 mg total) by mouth 2 (two) times daily. 28 tablet 0  . Ensure (ENSURE) Take 237 mLs by mouth 3 (three) times daily between meals.     . folic acid (FOLVITE) 1 MG tablet Take 1 tablet (1 mg total) by mouth daily. 90 tablet 1  . ibrutinib 420 MG TABS Take 420 mg by mouth daily. 28 tablet 4  . metFORMIN (GLUCOPHAGE) 1000 MG tablet Take 1 tablet (1,000 mg total) by mouth daily with breakfast. 30 tablet 3  . methocarbamol (ROBAXIN) 500 MG tablet Take 1 tablet (500 mg total) by mouth every 6 (six) hours as needed for muscle spasms. 80 tablet 0  . mirtazapine (REMERON) 30 MG tablet Take 1 tablet (30 mg total)  by mouth at bedtime. 30 tablet 3  . naloxegol oxalate (MOVANTIK) 25 MG TABS tablet Take 1 tablet (25 mg total) by mouth daily. 30 tablet 2  . naloxone (NARCAN) 2 MG/2ML injection Inject 1 mL (1 mg total) into the muscle as needed for up to 2 doses (for opioid overdose). In case of emergency (overdose), inject into muscle of upper arm or leg and call 911. 2 mL 0  . Oxycodone HCl 10 MG TABS Take 1 tablet (10 mg total) by mouth every 4 (four) hours as needed. Must last 30 days 180 tablet 0  . [START ON 11/08/2020] Oxycodone HCl 10 MG TABS Take 1 tablet (10 mg total) by mouth every 4 (four) hours as needed. Must last 30 days  180 tablet 0  . [START ON 12/08/2020] Oxycodone HCl 10 MG TABS Take 1 tablet (10 mg total) by mouth every 4 (four) hours as needed. Must last 30 days 180 tablet 0  . pantoprazole (PROTONIX) 40 MG tablet Take 40 mg by mouth at bedtime.    . gabapentin (NEURONTIN) 300 MG capsule Take 3 capsules (900 mg total) by mouth at bedtime AND 1 capsule (300 mg total) 2 (two) times daily. 150 capsule 2  . Oxycodone HCl 10 MG TABS Take 1 tablet (10 mg total) by mouth every 4 (four) hours as needed. Must last 30 days 180 tablet 0   No current facility-administered medications for this visit.    PHYSICAL EXAMINATION: ECOG PERFORMANCE STATUS: 1 - Symptomatic but completely ambulatory  BP (!) 141/84 (BP Location: Left Arm, Patient Position: Sitting, Cuff Size: Normal)   Pulse (!) 110   Temp 97.9 F (36.6 C) (Tympanic)   Resp 16   Ht _0  (1.727 m)   Wt 136 lb (61.7 kg)   SpO2 100%   BMI 20.68 kg/m   Filed Weights   11/03/20 0914  Weight: 136 lb (61.7 kg)    Physical Exam Constitutional:      Comments: He is alone.  Appears pale.  HENT:     Head: Normocephalic and atraumatic.     Mouth/Throat:     Pharynx: No oropharyngeal exudate.  Eyes:     Pupils: Pupils are equal, round, and reactive to light.  Cardiovascular:     Rate and Rhythm: Normal rate and regular rhythm.  Pulmonary:     Effort: No respiratory distress.     Breath sounds: No wheezing.  Abdominal:     General: Bowel sounds are normal. There is no distension.     Palpations: Abdomen is soft. There is no mass.     Tenderness: no abdominal tenderness There is no guarding or rebound.  Musculoskeletal:        General: No tenderness. Normal range of motion.     Cervical back: Normal range of motion and neck supple.  Skin:    General: Skin is warm.     Coloration: Skin is pale.     Comments: Multiple bruises noted in bilateral upper extremity.  Neurological:     Mental Status: He is alert and oriented to person, place, and time.   Psychiatric:        Mood and Affect: Affect normal.      LABORATORY DATA:  I have reviewed the data as listed    Component Value Date/Time   NA 139 11/03/2020 0915   NA 142 05/03/2014 1139   K 5.2 (H) 11/03/2020 0915   K 4.7 05/03/2014 1139   CL 103 11/03/2020  0915   CL 110 (H) 05/03/2014 1139   CO2 24 11/03/2020 0915   CO2 24 05/03/2014 1139   GLUCOSE 136 (H) 11/03/2020 0915   GLUCOSE 98 05/03/2014 1139   BUN 28 (H) 11/03/2020 0915   BUN 20 (H) 05/03/2014 1139   CREATININE 1.21 11/03/2020 0915   CREATININE 1.22 08/09/2014 1122   CALCIUM 9.2 11/03/2020 0915   CALCIUM 8.6 05/03/2014 1139   PROT 6.9 11/03/2020 0915   PROT 7.5 05/03/2014 1139   ALBUMIN 4.3 11/03/2020 0915   ALBUMIN 4.1 05/03/2014 1139   AST 16 11/03/2020 0915   AST 15 05/03/2014 1139   ALT 11 11/03/2020 0915   ALT 26 05/03/2014 1139   ALKPHOS 80 11/03/2020 0915   ALKPHOS 94 05/03/2014 1139   BILITOT 1.2 11/03/2020 0915   BILITOT 0.5 05/03/2014 1139   GFRNONAA >60 11/03/2020 0915   GFRNONAA >60 08/09/2014 1122   GFRAA 55 (L) 01/24/2020 1156   GFRAA >60 08/09/2014 1122    No results found for: SPEP, UPEP  Lab Results  Component Value Date   WBC 16.5 (H) 11/03/2020   NEUTROABS 2.2 11/03/2020   HGB 7.5 (L) 11/03/2020   HCT 23.6 (L) 11/03/2020   MCV 104.0 (H) 11/03/2020   PLT 227 11/03/2020      Chemistry      Component Value Date/Time   NA 139 11/03/2020 0915   NA 142 05/03/2014 1139   K 5.2 (H) 11/03/2020 0915   K 4.7 05/03/2014 1139   CL 103 11/03/2020 0915   CL 110 (H) 05/03/2014 1139   CO2 24 11/03/2020 0915   CO2 24 05/03/2014 1139   BUN 28 (H) 11/03/2020 0915   BUN 20 (H) 05/03/2014 1139   CREATININE 1.21 11/03/2020 0915   CREATININE 1.22 08/09/2014 1122      Component Value Date/Time   CALCIUM 9.2 11/03/2020 0915   CALCIUM 8.6 05/03/2014 1139   ALKPHOS 80 11/03/2020 0915   ALKPHOS 94 05/03/2014 1139   AST 16 11/03/2020 0915   AST 15 05/03/2014 1139   ALT 11 11/03/2020  0915   ALT 26 05/03/2014 1139   BILITOT 1.2 11/03/2020 0915   BILITOT 0.5 05/03/2014 1139       RADIOGRAPHIC STUDIES: I have personally reviewed the radiological images as listed and agreed with the findings in the report. No results found.   ASSESSMENT & PLAN:  CLL (chronic lymphocytic leukemia) ([place]) # CLL/SLL- relapsed most recently s/p Gazyva x6 cycles [AUG 2021] 15th MARCH 2022- No evidence of active lymphoma by FDG PET scan; Stable mild mediastinal adenopathy and moderate periaortic retroperitoneal adenopathy. No significant metabolic activity in these nodes; HOWEVER BONE MARROW BIOPSY- [April 2022]- RECURRENT/RELPASED CLL;CLL FISH- 11 q/17p DELETION.   # STOPPED  IBRUTINIB 420 mg/day - tolerating poorly worsening anemia [see below]  # Severe anemia likely secondary to CLL-/CKD -hemoglobin 7.0; proceed with 1 PRBC transfusion today.    #Chronic abdominal pain-unclear etiology- worse- currently being titrated ; on pain pump as per pain management. On dex 4 mg BID; add IV dex today also work up for porphyrias.   # weight loss/depression;-STABLE continue  remeron 30 mg qhs.   #Hyperkalemia-5.1; STABLE today- /CKD-III  [baseline 1.4; Dr.Lateef]; STABLE; continue LoKelma as per Nephrology.   # DISPOSITION: add lipase/amylase today labs # 1 unit PRBC today; Dex 10 mg IVP  # follow up in 2 weeks- MD;lab- cbc/CMP;LDH-/hold tube- 1unit PRBC -Dr.B  Orders Placed This Encounter  Procedures  . CBC with Differential  Standing Status:   Future    Standing Expiration Date:   11/03/2021  . Comprehensive metabolic panel    Standing Status:   Future    Standing Expiration Date:   11/03/2021  . Lactate dehydrogenase    Standing Status:   Future    Standing Expiration Date:   11/03/2021  . Hold Tube- Blood Bank    Standing Status:   Future    Standing Expiration Date:   11/03/2021   All questions were answered. The patient knows to call the clinic with any problems, questions or concerns.       Cammie Sickle, MD 11/05/2020 8:25 PM

## 2020-11-03 NOTE — Progress Notes (Signed)
Still has significant abdominal pain that he rates a 6-7 today. He is physically leaning over with pain. State she has not been able to eat much due to pain. States he was given something last time that helped with the pain. He thinks it was a steroid.

## 2020-11-03 NOTE — Assessment & Plan Note (Addendum)
#  CLL/SLL- relapsed most recently s/p Gazyva x6 cycles [AUG 2021] 15th MARCH 2022- No evidence of active lymphoma by FDG PET scan; Stable mild mediastinal adenopathy and moderate periaortic retroperitoneal adenopathy. No significant metabolic activity in these nodes; HOWEVER BONE MARROW BIOPSY- [April 2022]- RECURRENT/RELPASED CLL;CLL FISH- 11 q/17p DELETION.   # STOPPED  IBRUTINIB 420 mg/day - tolerating poorly worsening anemia [see below]  # Severe anemia likely secondary to CLL-/CKD -hemoglobin 7.0; proceed with 1 PRBC transfusion today.   #Chronic abdominal pain-unclear etiology- worse- currently being titrated ; on pain pump as per pain management. On dex 4 mg BID; add IV dex today also work up for porphyrias.   # weight loss/depression;-STABLE continue  remeron 30 mg qhs.   #Hyperkalemia-5.1; STABLE today- /CKD-III  [baseline 1.4; Dr.Lateef]; STABLE; continue LoKelma as per Nephrology.   # DISPOSITION: add lipase/amylase today labs # 1 unit PRBC today; Dex 10 mg IVP  # follow up in 2 weeks- MD;lab- cbc/CMP;LDH-/hold tube- 1unit PRBC -Dr.B   

## 2020-11-04 LAB — TYPE AND SCREEN
ABO/RH(D): A POS
Antibody Screen: NEGATIVE
Unit division: 0

## 2020-11-04 LAB — BPAM RBC
Blood Product Expiration Date: 202207132359
ISSUE DATE / TIME: 202207081049
Unit Type and Rh: 600

## 2020-11-05 ENCOUNTER — Encounter: Payer: Self-pay | Admitting: Internal Medicine

## 2020-11-14 ENCOUNTER — Other Ambulatory Visit: Payer: Self-pay | Admitting: Internal Medicine

## 2020-11-14 DIAGNOSIS — R1084 Generalized abdominal pain: Secondary | ICD-10-CM

## 2020-11-17 ENCOUNTER — Inpatient Hospital Stay: Payer: Medicare Other

## 2020-11-17 ENCOUNTER — Encounter: Payer: Self-pay | Admitting: Internal Medicine

## 2020-11-17 ENCOUNTER — Other Ambulatory Visit: Payer: Self-pay

## 2020-11-17 ENCOUNTER — Inpatient Hospital Stay (HOSPITAL_BASED_OUTPATIENT_CLINIC_OR_DEPARTMENT_OTHER): Payer: Medicare Other | Admitting: Internal Medicine

## 2020-11-17 VITALS — BP 99/68 | HR 124 | Temp 99.3°F | Resp 18 | Ht 68.0 in | Wt 132.0 lb

## 2020-11-17 DIAGNOSIS — Z5321 Procedure and treatment not carried out due to patient leaving prior to being seen by health care provider: Secondary | ICD-10-CM | POA: Insufficient documentation

## 2020-11-17 DIAGNOSIS — E860 Dehydration: Secondary | ICD-10-CM

## 2020-11-17 DIAGNOSIS — R5382 Chronic fatigue, unspecified: Secondary | ICD-10-CM

## 2020-11-17 DIAGNOSIS — C9110 Chronic lymphocytic leukemia of B-cell type not having achieved remission: Secondary | ICD-10-CM | POA: Diagnosis not present

## 2020-11-17 DIAGNOSIS — R109 Unspecified abdominal pain: Secondary | ICD-10-CM | POA: Diagnosis not present

## 2020-11-17 DIAGNOSIS — R55 Syncope and collapse: Secondary | ICD-10-CM | POA: Diagnosis not present

## 2020-11-17 LAB — CBC WITH DIFFERENTIAL/PLATELET
Abs Immature Granulocytes: 0.03 10*3/uL (ref 0.00–0.07)
Basophils Absolute: 0 10*3/uL (ref 0.0–0.1)
Basophils Relative: 0 %
Eosinophils Absolute: 0 10*3/uL (ref 0.0–0.5)
Eosinophils Relative: 0 %
HCT: 25.1 % — ABNORMAL LOW (ref 39.0–52.0)
Hemoglobin: 8.2 g/dL — ABNORMAL LOW (ref 13.0–17.0)
Immature Granulocytes: 0 %
Lymphocytes Relative: 75 %
Lymphs Abs: 9.4 10*3/uL — ABNORMAL HIGH (ref 0.7–4.0)
MCH: 33.5 pg (ref 26.0–34.0)
MCHC: 32.7 g/dL (ref 30.0–36.0)
MCV: 102.4 fL — ABNORMAL HIGH (ref 80.0–100.0)
Monocytes Absolute: 0.5 10*3/uL (ref 0.1–1.0)
Monocytes Relative: 4 %
Neutro Abs: 2.7 10*3/uL (ref 1.7–7.7)
Neutrophils Relative %: 21 %
Platelets: 204 10*3/uL (ref 150–400)
RBC: 2.45 MIL/uL — ABNORMAL LOW (ref 4.22–5.81)
RDW: 23.9 % — ABNORMAL HIGH (ref 11.5–15.5)
Smear Review: ADEQUATE
WBC: 12.6 10*3/uL — ABNORMAL HIGH (ref 4.0–10.5)
nRBC: 0 % (ref 0.0–0.2)

## 2020-11-17 LAB — COMPREHENSIVE METABOLIC PANEL
ALT: 26 U/L (ref 0–44)
ALT: 15 U/L (ref 0–44)
AST: 23 U/L (ref 15–41)
AST: 20 U/L (ref 15–41)
Albumin: 4.5 g/dL (ref 3.5–5.0)
Albumin: 3.2 g/dL — ABNORMAL LOW (ref 3.5–5.0)
Alkaline Phosphatase: 59 U/L (ref 38–126)
Alkaline Phosphatase: 40 U/L (ref 38–126)
Anion gap: 15 (ref 5–15)
Anion gap: 6 (ref 5–15)
BUN: 39 mg/dL — ABNORMAL HIGH (ref 8–23)
BUN: 33 mg/dL — ABNORMAL HIGH (ref 8–23)
CO2: 23 mmol/L (ref 22–32)
CO2: 20 mmol/L — ABNORMAL LOW (ref 22–32)
Calcium: 9.6 mg/dL (ref 8.9–10.3)
Calcium: 7.3 mg/dL — ABNORMAL LOW (ref 8.9–10.3)
Chloride: 104 mmol/L (ref 98–111)
Chloride: 115 mmol/L — ABNORMAL HIGH (ref 98–111)
Creatinine, Ser: 1.6 mg/dL — ABNORMAL HIGH (ref 0.61–1.24)
Creatinine, Ser: 1.07 mg/dL (ref 0.61–1.24)
GFR, Estimated: 46 mL/min — ABNORMAL LOW (ref >60)
GFR, Estimated: >60 mL/min (ref >60)
Glucose, Bld: 126 mg/dL — ABNORMAL HIGH (ref 70–99)
Glucose, Bld: 113 mg/dL — ABNORMAL HIGH (ref 70–99)
Potassium: 5.3 mmol/L — ABNORMAL HIGH (ref 3.5–5.1)
Potassium: 4.4 mmol/L (ref 3.5–5.1)
Sodium: 142 mmol/L (ref 135–145)
Sodium: 141 mmol/L (ref 135–145)
Total Bilirubin: 1.4 mg/dL — ABNORMAL HIGH (ref 0.3–1.2)
Total Bilirubin: 1.5 mg/dL — ABNORMAL HIGH (ref 0.3–1.2)
Total Protein: 6.9 g/dL (ref 6.5–8.1)
Total Protein: 4.9 g/dL — ABNORMAL LOW (ref 6.5–8.1)

## 2020-11-17 LAB — CBC
HCT: 19.3 % — ABNORMAL LOW (ref 39.0–52.0)
Hemoglobin: 6.2 g/dL — ABNORMAL LOW (ref 13.0–17.0)
MCH: 34.1 pg — ABNORMAL HIGH (ref 26.0–34.0)
MCHC: 32.1 g/dL (ref 30.0–36.0)
MCV: 106 fL — ABNORMAL HIGH (ref 80.0–100.0)
Platelets: 153 10*3/uL (ref 150–400)
RBC: 1.82 MIL/uL — ABNORMAL LOW (ref 4.22–5.81)
RDW: 24.4 % — ABNORMAL HIGH (ref 11.5–15.5)
WBC: 7.2 10*3/uL (ref 4.0–10.5)
nRBC: 0 % (ref 0.0–0.2)

## 2020-11-17 LAB — SAMPLE TO BLOOD BANK

## 2020-11-17 LAB — TROPONIN I (HIGH SENSITIVITY): Troponin I (High Sensitivity): 13 ng/L (ref <18)

## 2020-11-17 LAB — LACTATE DEHYDROGENASE: LDH: 108 U/L (ref 98–192)

## 2020-11-17 MED ORDER — DEXAMETHASONE 2 MG PO TABS: 2.0000 mg | ORAL_TABLET | Freq: Two times a day (BID) | ORAL | 0 refills | Status: DC

## 2020-11-17 MED ORDER — SODIUM CHLORIDE 0.9 % IV SOLN
INTRAVENOUS | Status: DC
  Filled 2020-11-17 (×2): qty 250

## 2020-11-17 MED ORDER — DEXAMETHASONE SODIUM PHOSPHATE 10 MG/ML IJ SOLN
10.0000 mg | Freq: Once | INTRAMUSCULAR | Status: AC
  Administered 2020-11-17: 10 mg via INTRAVENOUS
  Filled 2020-11-17: qty 1

## 2020-11-17 NOTE — Patient Instructions (Signed)
[facility]  Discharge Instructions: Thank you for choosing [facility] to provide your oncology and hematology care.  If you have a lab appointment with the [facility], please go directly to the [facility] and check in at the registration area.  Wear comfortable clothing and clothing appropriate for easy access to any Portacath or PICC line.   We strive to give you quality time with your provider. You may need to reschedule your appointment if you arrive late (15 or more minutes).  Arriving late affects you and other patients whose appointments are after yours.  Also, if you miss three or more appointments without notifying the office, you may be dismissed from the clinic at the provider's discretion.      For prescription refill requests, have your pharmacy contact our office and allow 72 hours for refills to be completed.    Today you received the following chemotherapy and/or immunotherapy agents : Hydration    To help prevent nausea and vomiting after your treatment, we encourage you to take your nausea medication as directed.  BELOW ARE SYMPTOMS THAT SHOULD BE REPORTED IMMEDIATELY: *FEVER GREATER THAN 100.4 F (38 C) OR HIGHER *CHILLS OR SWEATING *NAUSEA AND VOMITING THAT IS NOT CONTROLLED WITH YOUR NAUSEA MEDICATION *UNUSUAL SHORTNESS OF BREATH *UNUSUAL BRUISING OR BLEEDING *URINARY PROBLEMS (pain or burning when urinating, or frequent urination) *BOWEL PROBLEMS (unusual diarrhea, constipation, pain near the anus) TENDERNESS IN MOUTH AND THROAT WITH OR WITHOUT PRESENCE OF ULCERS (sore throat, sores in mouth, or a toothache) UNUSUAL RASH, SWELLING OR PAIN  UNUSUAL VAGINAL DISCHARGE OR ITCHING   Items with * indicate a potential emergency and should be followed up as soon as possible or go to the Emergency Department if any problems should occur.  Please show the CHEMOTHERAPY ALERT CARD or IMMUNOTHERAPY ALERT CARD at check-in  to the Emergency Department and triage nurse.  Should you have questions after your visit or need to cancel or reschedule your appointment, please contact [facility]  [phone] and follow the prompts.  Office hours are 8:00 a.m. to 4:30 p.m. Monday - Friday. Please note that voicemails left after 4:00 p.m. may not be returned until the following business day.  We are closed weekends and major holidays. You have access to a nurse at all times for urgent questions. Please call the main number to the clinic [phone] and follow the prompts.  For any non-urgent questions, you may also contact your provider using MyChart. We now offer e-Visits for anyone 39 and older to request care online for non-urgent symptoms. For details visit [email or web address].   Also download the MyChart app! Go to the app store, search "MyChart", open the app, select [facility], and log in with your MyChart username and password.  Due to Covid, a mask is required upon entering the hospital/clinic. If you do not have a mask, one will be given to you upon arrival. For doctor visits, patients may have 1 support person aged 70 or older with them. For treatment visits, patients cannot have anyone with them due to current Covid guidelines and our immunocompromised population.

## 2020-11-17 NOTE — Progress Notes (Signed)
Should there [facility] [place] OFFICE PROGRESS NOTE  Patient Care Team: [name], MD as PCP - General (Internal Medicine) Cammie Sickle, MD as Consulting Physician (Hematology and Oncology) Borders, Kirt Boys, NP as Nurse Practitioner (Hospice and Palliative Medicine) Verlon Au, NP as Nurse Practitioner (Hematology and Oncology) Jacquelin Hawking, NP as Nurse Practitioner (Oncology) Lonell Face, NP as Nurse Practitioner (Neurosurgery) Milinda Pointer, MD as Consulting Physician (Pain Medicine)  Cancer Staging No matching staging information was found for the patient.   Oncology History Overview Note  # 2006- CLL STAGE IV; MAY 2011- WBC- 57K;Platelets-99;Hb-12/CT Bulky LN; BMBx- 80% Invol; del 11; START Benda-Ritux x4 [finished Sep 2011];   # July 2015-Progression; Sep 2015-START ibrutinib; CT scan DEC 2015- Improvement LN; Cont Ibrutinib 136m/d; NOV 2016 CT- 1-2CM LN [mild progression compared to Dec 2015];NOV 2016- FISH peripheral blood- NO MUTATIONS/CD-38 Positive; NOV 7th- CONT IBRUTINIB 2 pills/day; CT AUG 2017- STABLE;  DEC 6th PET- Mild RP LN/ Retrocrural LN  # OFF ibrutinib [? intol]- sep 2019- Jan 16th 2020; Re-start Ibrutinib; September 2020-stop ibrutinib [poor tolerance/worsening anemia]  # Jan 16th 2020- start aranesp; [place]   # MARCH 11th 2021- Gazyva x 6 finished AUG 2021.   #April May 2022-worsening anemia- BMBx- CLL; cytogenetics: 20 q del [incidental]; rFISH- 17p/11q DEL  # Early May 2022- START IBRUTINIB 420 mg/day  # SEP 2021- prostate enlargement on CT scan- UA-NEG; PSA- WNL; s/p Dr.Wolff.    # DEC 2019- PAIN CONTRACT  # PALLIATIVE CARE- 06/21/2019-  # October 2019-bone marrow biopsy [worsening anemia]-question dyserythropoietic changes/small clone of CLL;  # [facility] HEM- NEG.  SA skin infection [Oct 2016] s/p clinda -------------------------------------------------------    DIAGNOSIS:  CLL  STAGE:   IV      ;GOALS: palliative  CURRENT/MOST RECENT THERAPY : Ibrutinib    [place]) [place]  07/08/2019 -  Chemotherapy   The patient had obinutuzumab (GAZYVA) 100 mg in sodium chloride 0.9 % 100 mL (0.9615 mg/mL) chemo infusion, 100 mg, Intravenous, Once, 6 of 6 cycles Administration: 100 mg (07/08/2019), 900 mg (07/09/2019), 1,000 mg (07/26/2019), 1,000 mg (08/16/2019), 1,000 mg (08/02/2019), 1,000 mg (09/13/2019), 1,000 mg (10/11/2019), 1,000 mg (11/09/2019), 1,000 mg (12/07/2019)   for chemotherapy treatment.       INTERVAL HISTORY:  James RIDLEY89y.[id number].  male pleasant patient above history of CLL and chronic abdominal pain of unclear etiology; severe anemia of unclear etiology is here for follow-up.  Patient continues to feel poorly.  He complains of worsening abdominal pain.  Losing weight.  Dizzy spells.  Positive for nausea but no vomiting.  Appetite is poor.  Of note patient feels improved for few days after he gets IV steroids and IV fluids in the clinic.  Again he is feeling poorly.   Review of Systems  Constitutional:  Positive for malaise/fatigue and weight loss. Negative for chills, diaphoresis and fever.  HENT:  Negative for nosebleeds and sore throat.   Eyes:  Negative for double vision.  Respiratory:  Positive for shortness of breath. Negative for cough, hemoptysis, sputum production and wheezing.   Cardiovascular:  Negative for chest pain, palpitations, orthopnea and leg swelling.  Gastrointestinal:  Positive for abdominal pain, constipation and nausea. Negative for blood in stool, diarrhea, heartburn, melena and vomiting.  Genitourinary:  Negative for dysuria, frequency and urgency.  Skin: Negative.  Negative for itching and rash.  Neurological:  Positive for dizziness and weakness. Negative for tingling,  focal weakness and headaches.  Endo/Heme/Allergies:  Does not bruise/bleed easily.  Psychiatric/Behavioral:  Negative for depression.  The patient is not nervous/anxious and does not have insomnia.      PAST MEDICAL HISTORY :  Past Medical History:  Diagnosis Date   CLL (chronic lymphocytic leukemia) ([place])    Constipation    Depression    Diabetes mellitus without complication (HCC)    GERD (gastroesophageal reflux disease)    Hematuria    Hyperlipidemia    Hypertension    Therapeutic opioid induced constipation     PAST SURGICAL HISTORY :   Past Surgical History:  Procedure Laterality Date   CATARACT EXTRACTION W/ INTRAOCULAR LENS IMPLANT Bilateral    CHOLECYSTECTOMY  1983   COLONOSCOPY WITH PROPOFOL N/A 10/27/2017   Procedure: COLONOSCOPY WITH PROPOFOL;  Surgeon: Manya Silvas, MD;  Location[facility] ENDOSCOPY;  Service: Endoscopy;  Laterality: N/A;   ESOPHAGOGASTRODUODENOSCOPY (EGD) WITH PROPOFOL N/A 11/20/2016   Procedure: ESOPHAGOGASTRODUODENOSCOPY (EGD) WITH PROPOFOL;  Surgeon: Manya Silvas, MD;  Location[facility] ENDOSCOPY;  Service: Endoscopy;  Laterality: N/A;   ESOPHAGOGASTRODUODENOSCOPY (EGD) WITH PROPOFOL N/A 10/27/2017   Procedure: ESOPHAGOGASTRODUODENOSCOPY (EGD) WITH PROPOFOL;  Surgeon: Manya Silvas, MD;  Location: [facility] ENDOSCOPY;  Service: Endoscopy;  Laterality: N/A;   INTRATHECAL PUMP IMPLANT Left 02/07/2020   Procedure: INTRATHECAL PUMP & CATHETER IMPLANT;  Surgeon: Deetta Perla, MD;  Location: [facility] ORS;  Service: Neurosurgery;  Laterality: Left;   LOWER EXTREMITY ANGIOGRAPHY Right 05/19/2017   Procedure: LOWER EXTREMITY ANGIOGRAPHY;  Surgeon: Algernon Huxley, MD;  Location: [facility] CV LAB;  Service: Cardiovascular;  Laterality: Right;   LOWER EXTREMITY ANGIOGRAPHY Left 08/10/2018   Procedure: LOWER EXTREMITY ANGIOGRAPHY;  Surgeon: Algernon Huxley, MD;  Location: [place] CV LAB;  Service: Cardiovascular;  Laterality: Left;    FAMILY HISTORY :   Family History  Problem Relation Age of Onset   Hypertension Sister     SOCIAL HISTORY:   Social History   Tobacco Use   Smoking  status: Every Day    Packs/day: 0.50    Years: 47.00    Pack years: 23.50    Types: Cigarettes   Smokeless tobacco: Never  Vaping Use   Vaping Use: Never used  Substance Use Topics   Alcohol use: Yes    Alcohol/week: 1.0 standard drink    Types: 1 Glasses of wine per week    Comment:  almost none in last 6 months   Drug use: No    ALLERGIES:  has No Known Allergies.  MEDICATIONS:  Current Outpatient Medications  Medication Sig Dispense Refill   dexamethasone (DECADRON) 2 MG tablet Take 1 tablet (2 mg total) by mouth 2 (two) times daily with a meal. 30 tablet 0   AMBULATORY NON FORMULARY MEDICATION Medication Name: Intrathecal pump Bupivacaine 20.0 mg/ml Fentanyl 2,000.0 mcg/ml Dose 1846.4 mcg/day     clopidogrel (PLAVIX) 75 MG tablet Take 75 mg by mouth daily.      Ensure (ENSURE) Take 237 mLs by mouth 3 (three) times daily between meals.      folic acid (FOLVITE) 1 MG tablet Take 1 tablet (1 mg total) by mouth daily. 90 tablet 1   gabapentin (NEURONTIN) 300 MG capsule Take 3 capsules (900 mg total) by mouth at bedtime AND 1 capsule (300 mg total) 2 (two) times daily. 150 capsule 2   ibrutinib 420 MG TABS Take 420 mg by mouth daily. 28 tablet 4   metFORMIN (GLUCOPHAGE) 1000 MG tablet Take 1  tablet (1,000 mg total) by mouth daily with breakfast. 30 tablet 3   methocarbamol (ROBAXIN) 500 MG tablet Take 1 tablet (500 mg total) by mouth every 6 (six) hours as needed for muscle spasms. 80 tablet 0   mirtazapine (REMERON) 30 MG tablet Take 1 tablet (30 mg total) by mouth at bedtime. 30 tablet 3   naloxegol oxalate (MOVANTIK) 25 MG TABS tablet Take 1 tablet (25 mg total) by mouth daily. 30 tablet 2   naloxone (NARCAN) 2 MG/2ML injection Inject 1 mL (1 mg total) into the muscle as needed for up to 2 doses (for opioid overdose). In case of emergency (overdose), inject into muscle of upper arm or leg and call 911. 2 mL 0   Oxycodone HCl 10 MG TABS Take 1 tablet (10 mg total) by mouth every  4 (four) hours as needed. Must last 30 days 180 tablet 0   Oxycodone HCl 10 MG TABS Take 1 tablet (10 mg total) by mouth every 4 (four) hours as needed. Must last 30 days 180 tablet 0   Oxycodone HCl 10 MG TABS Take 1 tablet (10 mg total) by mouth every 4 (four) hours as needed. Must last 30 days 180 tablet 0   [START ON 12/08/2020] Oxycodone HCl 10 MG TABS Take 1 tablet (10 mg total) by mouth every 4 (four) hours as needed. Must last 30 days 180 tablet 0   pantoprazole (PROTONIX) 40 MG tablet Take 40 mg by mouth at bedtime.     No current facility-administered medications for this visit.    PHYSICAL EXAMINATION: ECOG PERFORMANCE STATUS: 1 - Symptomatic but completely ambulatory  BP 99/68 (BP Location: Left Arm, Patient Position: Sitting, Cuff Size: Normal)   Pulse (!) 124   Temp 99.3 F (37.4 C) (Tympanic)   Resp 18   Ht _0  (1.727 m)   Wt 132 lb (59.9 kg)   SpO2 100%   BMI 20.07 kg/m   Filed Weights   11/17/20 0845  Weight: 132 lb (59.9 kg)    Physical Exam Constitutional:      Comments: He is accompanied by his Corinne Ports.  Appears weak.  Fetal position because of abdominal pain.  Appears pale.  HENT:     Head: Normocephalic and atraumatic.     Mouth/Throat:     Pharynx: No oropharyngeal exudate.  Eyes:     Pupils: Pupils are equal, round, and reactive to light.  Cardiovascular:     Rate and Rhythm: Normal rate and regular rhythm.  Pulmonary:     Effort: No respiratory distress.     Breath sounds: No wheezing.  Abdominal:     General: Bowel sounds are normal. There is no distension.     Palpations: Abdomen is soft. There is no mass.     Tenderness: There is no abdominal tenderness. There is no guarding or rebound.     Comments: Nontender no rigidity or guarding.  Musculoskeletal:        General: No tenderness. Normal range of motion.     Cervical back: Normal range of motion and neck supple.  Skin:    General: Skin is warm.     Coloration: Skin is pale.      Comments: Multiple bruises noted in bilateral upper extremity.  Neurological:     Mental Status: He is alert and oriented to person, place, and time.  Psychiatric:        Mood and Affect: Affect normal.      LABORATORY DATA:  I have reviewed the data as listed    Component Value Date/Time   NA 142 11/17/2020 0824   NA 142 05/03/2014 1139   K 5.3 (H) 11/17/2020 0824   K 4.7 05/03/2014 1139   CL 104 11/17/2020 0824   CL 110 (H) 05/03/2014 1139   CO2 23 11/17/2020 0824   CO2 24 05/03/2014 1139   GLUCOSE 126 (H) 11/17/2020 0824   GLUCOSE 98 05/03/2014 1139   BUN 39 (H) 11/17/2020 0824   BUN 20 (H) 05/03/2014 1139   CREATININE 1.60 (H) 11/17/2020 0824   CREATININE 1.22 08/09/2014 1122   CALCIUM 9.6 11/17/2020 0824   CALCIUM 8.6 05/03/2014 1139   PROT 6.9 11/17/2020 0824   PROT 7.5 05/03/2014 1139   ALBUMIN 4.5 11/17/2020 0824   ALBUMIN 4.1 05/03/2014 1139   AST 23 11/17/2020 0824   AST 15 05/03/2014 1139   ALT 26 11/17/2020 0824   ALT 26 05/03/2014 1139   ALKPHOS 59 11/17/2020 0824   ALKPHOS 94 05/03/2014 1139   BILITOT 1.4 (H) 11/17/2020 0824   BILITOT 0.5 05/03/2014 1139   GFRNONAA 46 (L) 11/17/2020 0824   GFRNONAA >60 08/09/2014 1122   GFRAA 55 (L) 01/24/2020 1156   GFRAA >60 08/09/2014 1122    No results found for: SPEP, UPEP  Lab Results  Component Value Date   WBC 12.6 (H) 11/17/2020   NEUTROABS 2.7 11/17/2020   HGB 8.2 (L) 11/17/2020   HCT 25.1 (L) 11/17/2020   MCV 102.4 (H) 11/17/2020   PLT 204 11/17/2020      Chemistry      Component Value Date/Time   NA 142 11/17/2020 0824   NA 142 05/03/2014 1139   K 5.3 (H) 11/17/2020 0824   K 4.7 05/03/2014 1139   CL 104 11/17/2020 0824   CL 110 (H) 05/03/2014 1139   CO2 23 11/17/2020 0824   CO2 24 05/03/2014 1139   BUN 39 (H) 11/17/2020 0824   BUN 20 (H) 05/03/2014 1139   CREATININE 1.60 (H) 11/17/2020 0824   CREATININE 1.22 08/09/2014 1122      Component Value Date/Time   CALCIUM 9.6 11/17/2020  0824   CALCIUM 8.6 05/03/2014 1139   ALKPHOS 59 11/17/2020 0824   ALKPHOS 94 05/03/2014 1139   AST 23 11/17/2020 0824   AST 15 05/03/2014 1139   ALT 26 11/17/2020 0824   ALT 26 05/03/2014 1139   BILITOT 1.4 (H) 11/17/2020 0824   BILITOT 0.5 05/03/2014 1139       RADIOGRAPHIC STUDIES: I have personally reviewed the radiological images as listed and agreed with the findings in the report. No results found.   ASSESSMENT & PLAN:  CLL (chronic lymphocytic leukemia) ([place]) # CLL/SLL- relapsed most recently s/p Gazyva x6 cycles [AUG 2021] 15th MARCH 2022- No evidence of active lymphoma by FDG PET scan; Stable mild mediastinal adenopathy and moderate periaortic retroperitoneal adenopathy. No significant metabolic activity in these nodes; HOWEVER BONE MARROW BIOPSY- [April 2022]- RECURRENT/RELPASED CLL;CLL FISH- 11 q/17p DELETION.   # STOPPED  IBRUTINIB 420 mg/day - tolerating poorly worsening anemia [see below].  # Severe anemia likely secondary to CLL-/CKD -hemoglobin 8.4; HOLD blood transfusion today.   #Chronic abdominal pain-unclear etiology- WORSE- currently being titrated on pain pump as per pain management.  Question adrenal insufficiency/given intermittent hyperkalemia/and also improvement on IV dexamethasone-proceed with IV fluids/IV Dex 10 mg today.  We will plan dexamethasone 2 mg p.o. twice daily.  Again reevaluate in 1 week; check morning cortisol; ACTH baseline.   #  weight loss/depression;-worse; continue  remeron 30 mg qhs. See above.   #Hyperkalemia-5.1; STABLE today- /CKD-III  [baseline 1.4; Dr.Lateef]; STABLE; continue LoKelma as per nephrology.   # DISPOSITION:  # TODAY-2 lits if IVFs; Dex 10 mg IVP  # NP- 1 week- possible IVFs x1 hours; dex 10 mg # follow up in 2 weeks- MD;lab- cbc/CMP;LDH-/hold tube- 1unit PRBC -Dr.B  Addendum: I spoke to patient's son [name] in [place] the concern for insufficiency-and further plan to start the patient on steroids.  With  regards to moving to [place] at this time-recommend holding off given patient's ongoing acute illness/and the difficulty with the transfer of care at this time.  Patient wants to stay locally-which is quite reasonable given the patient's ongoing illness.  [name] will inform his father regarding steroid prescription/directions etc.  Orders Placed This Encounter  Procedures   CBC with Differential    Standing Status:   Future    Standing Expiration Date:   11/17/2021   Comprehensive metabolic panel    Standing Status:   Future    Standing Expiration Date:   11/17/2021   Lactate dehydrogenase    Standing Status:   Future    Standing Expiration Date:   11/17/2021   Cortisol    Standing Status:   Future    Standing Expiration Date:   11/17/2021   ACTH    Standing Status:   Future    Standing Expiration Date:   11/17/2021   Testosterone    Standing Status:   Future    Standing Expiration Date:   11/17/2021   Thyroid Panel With TSH    Standing Status:   Future    Standing Expiration Date:   11/17/2021   Hold Tube- Blood Bank    Standing Status:   Future    Standing Expiration Date:   11/17/2021   All questions were answered. The patient knows to call the clinic with any problems, questions or concerns.      Cammie Sickle, MD 11/17/2020 5:44 PM

## 2020-11-17 NOTE — Assessment & Plan Note (Addendum)
#  CLL/SLL- relapsed most recently s/p Gazyva x6 cycles [AUG 2021] 15th MARCH 2022- No evidence of active lymphoma by FDG PET scan; Stable mild mediastinal adenopathy and moderate periaortic retroperitoneal adenopathy. No significant metabolic activity in these nodes; HOWEVER BONE MARROW BIOPSY- [April 2022]- RECURRENT/RELPASED CLL;CLL FISH- 11 q/17p DELETION.   # STOPPED  IBRUTINIB 420 mg/day - tolerating poorly worsening anemia [see below].  # Severe anemia likely secondary to CLL-/CKD -hemoglobin 8.4; HOLD blood transfusion today.  #Chronic abdominal pain-unclear etiology- WORSE- currently being titrated on pain pump as per pain management.  Question adrenal insufficiency/given intermittent hyperkalemia/and also improvement on IV dexamethasone-proceed with IV fluids/IV Dex 10 mg today.  We will plan dexamethasone 2 mg p.o. twice daily.  Again reevaluate in 1 week; check morning cortisol; ACTH baseline.   # weight loss/depression;-worse; continue  remeron 30 mg qhs. See above.   #Hyperkalemia-5.1; STABLE today- /CKD-III  [baseline 1.4; Dr.Lateef]; STABLE; continue LoKelma as per nephrology.   # DISPOSITION:  # TODAY-2 lits if IVFs; Dex 10 mg IVP  # NP- 1 week- possible IVFs x1 hours; dex 10 mg # follow up in 2 weeks- MD;lab- cbc/CMP;LDH-/hold tube- 1unit PRBC -Dr.B  Addendum: I spoke to patient's son [name] in [place] the concern for insufficiency-and further plan to start the patient on steroids.  With regards to moving to [place] at this time-recommend holding off given patient's ongoing acute illness/and the difficulty with the transfer of care at this time.  Patient wants to stay locally-which is quite reasonable given the patient's ongoing illness.  [name] will inform his father regarding steroid prescription/directions etc. # 40 minutes face-to-face with the patient discussing the above plan of care; more than 50% of time spent on prognosis/ natural history; counseling and  coordination.

## 2020-11-17 NOTE — ED Triage Notes (Addendum)
Per ems pt recevied blood transfusion today and md office for leukemia. Pt denies getting chemo/radiation. Pt states was at home tonight and felt like he was going to pass out. Per ems pt with orthostasis and tachycardia on their arrival. Pt received 1l bolus ns with improvement in blood pressure and hr. Ems states h r was in 130s. Pt complains of mid abd pain, dry oral mucus membranes noted.

## 2020-11-17 NOTE — Progress Notes (Signed)
Having sever abdominal pain and weakness. States pain is at a level 9. Did take pain medication this morning with no relief. Not able to eat much. Has his step son with him today.

## 2020-11-18 ENCOUNTER — Emergency Department
Admission: EM | Admit: 2020-11-18 | Discharge: 2020-11-18 | Disposition: A | Discharge status: Home / Self Care | Payer: Medicare Other | Attending: Emergency Medicine | Admitting: Emergency Medicine

## 2020-11-21 ENCOUNTER — Inpatient Hospital Stay
Admission: EM | Admit: 2020-11-21 | Discharge: 2020-11-25 | DRG: 377 | Disposition: A | Discharge status: Home Health | Payer: Medicare Other | Attending: Internal Medicine | Admitting: Internal Medicine

## 2020-11-21 ENCOUNTER — Other Ambulatory Visit: Payer: Self-pay

## 2020-11-21 ENCOUNTER — Telehealth: Payer: Self-pay | Admitting: *Deleted

## 2020-11-21 ENCOUNTER — Emergency Department: Payer: Medicare Other

## 2020-11-21 ENCOUNTER — Other Ambulatory Visit (HOSPITAL_COMMUNITY): Payer: Self-pay

## 2020-11-21 DIAGNOSIS — Z9049 Acquired absence of other specified parts of digestive tract: Secondary | ICD-10-CM

## 2020-11-21 DIAGNOSIS — D649 Anemia, unspecified: Secondary | ICD-10-CM | POA: Diagnosis present

## 2020-11-21 DIAGNOSIS — E1151 Type 2 diabetes mellitus with diabetic peripheral angiopathy without gangrene: Secondary | ICD-10-CM | POA: Diagnosis present

## 2020-11-21 DIAGNOSIS — E2740 Unspecified adrenocortical insufficiency: Secondary | ICD-10-CM | POA: Diagnosis present

## 2020-11-21 DIAGNOSIS — E861 Hypovolemia: Principal | ICD-10-CM | POA: Diagnosis present

## 2020-11-21 DIAGNOSIS — E876 Hypokalemia: Secondary | ICD-10-CM | POA: Diagnosis present

## 2020-11-21 DIAGNOSIS — Z79899 Other long term (current) drug therapy: Secondary | ICD-10-CM

## 2020-11-21 DIAGNOSIS — Z8249 Family history of ischemic heart disease and other diseases of the circulatory system: Secondary | ICD-10-CM

## 2020-11-21 DIAGNOSIS — E273 Drug-induced adrenocortical insufficiency: Secondary | ICD-10-CM

## 2020-11-21 DIAGNOSIS — F17210 Nicotine dependence, cigarettes, uncomplicated: Secondary | ICD-10-CM | POA: Diagnosis present

## 2020-11-21 DIAGNOSIS — Z66 Do not resuscitate: Secondary | ICD-10-CM | POA: Diagnosis present

## 2020-11-21 DIAGNOSIS — Z6820 Body mass index (BMI) 20.0-20.9, adult: Secondary | ICD-10-CM

## 2020-11-21 DIAGNOSIS — F17200 Nicotine dependence, unspecified, uncomplicated: Secondary | ICD-10-CM | POA: Diagnosis present

## 2020-11-21 DIAGNOSIS — E785 Hyperlipidemia, unspecified: Secondary | ICD-10-CM | POA: Diagnosis present

## 2020-11-21 DIAGNOSIS — C9110 Chronic lymphocytic leukemia of B-cell type not having achieved remission: Secondary | ICD-10-CM | POA: Diagnosis present

## 2020-11-21 DIAGNOSIS — I129 Hypertensive chronic kidney disease with stage 1 through stage 4 chronic kidney disease, or unspecified chronic kidney disease: Secondary | ICD-10-CM | POA: Diagnosis present

## 2020-11-21 DIAGNOSIS — K219 Gastro-esophageal reflux disease without esophagitis: Secondary | ICD-10-CM | POA: Diagnosis present

## 2020-11-21 DIAGNOSIS — G894 Chronic pain syndrome: Secondary | ICD-10-CM | POA: Diagnosis present

## 2020-11-21 DIAGNOSIS — E43 Unspecified severe protein-calorie malnutrition: Secondary | ICD-10-CM | POA: Insufficient documentation

## 2020-11-21 DIAGNOSIS — K5521 Angiodysplasia of colon with hemorrhage: Principal | ICD-10-CM | POA: Diagnosis present

## 2020-11-21 DIAGNOSIS — R54 Age-related physical debility: Secondary | ICD-10-CM | POA: Diagnosis present

## 2020-11-21 DIAGNOSIS — R296 Repeated falls: Secondary | ICD-10-CM | POA: Diagnosis present

## 2020-11-21 DIAGNOSIS — K31811 Angiodysplasia of stomach and duodenum with bleeding: Secondary | ICD-10-CM | POA: Diagnosis present

## 2020-11-21 DIAGNOSIS — Z95820 Peripheral vascular angioplasty status with implants and grafts: Secondary | ICD-10-CM

## 2020-11-21 DIAGNOSIS — Z20822 Contact with and (suspected) exposure to covid-19: Secondary | ICD-10-CM | POA: Diagnosis present

## 2020-11-21 DIAGNOSIS — D62 Acute posthemorrhagic anemia: Secondary | ICD-10-CM | POA: Diagnosis present

## 2020-11-21 DIAGNOSIS — Z7902 Long term (current) use of antithrombotics/antiplatelets: Secondary | ICD-10-CM

## 2020-11-21 DIAGNOSIS — I70203 Unspecified atherosclerosis of native arteries of extremities, bilateral legs: Secondary | ICD-10-CM | POA: Diagnosis present

## 2020-11-21 DIAGNOSIS — D638 Anemia in other chronic diseases classified elsewhere: Secondary | ICD-10-CM | POA: Diagnosis present

## 2020-11-21 DIAGNOSIS — Z7984 Long term (current) use of oral hypoglycemic drugs: Secondary | ICD-10-CM

## 2020-11-21 DIAGNOSIS — I9589 Other hypotension: Secondary | ICD-10-CM | POA: Diagnosis present

## 2020-11-21 LAB — COMPREHENSIVE METABOLIC PANEL
ALT: 14 U/L (ref 0–44)
AST: 12 U/L — ABNORMAL LOW (ref 15–41)
Albumin: 3.7 g/dL (ref 3.5–5.0)
Alkaline Phosphatase: 51 U/L (ref 38–126)
Anion gap: 9 (ref 5–15)
BUN: 49 mg/dL — ABNORMAL HIGH (ref 8–23)
CO2: 22 mmol/L (ref 22–32)
Calcium: 8.1 mg/dL — ABNORMAL LOW (ref 8.9–10.3)
Chloride: 105 mmol/L (ref 98–111)
Creatinine, Ser: 1.58 mg/dL — ABNORMAL HIGH (ref 0.61–1.24)
GFR, Estimated: 46 mL/min — ABNORMAL LOW (ref >60)
Glucose, Bld: 101 mg/dL — ABNORMAL HIGH (ref 70–99)
Potassium: 3.3 mmol/L — ABNORMAL LOW (ref 3.5–5.1)
Sodium: 136 mmol/L (ref 135–145)
Total Bilirubin: 2.2 mg/dL — ABNORMAL HIGH (ref 0.3–1.2)
Total Protein: 5.5 g/dL — ABNORMAL LOW (ref 6.5–8.1)

## 2020-11-21 LAB — URINALYSIS, COMPLETE (UACMP) WITH MICROSCOPIC
Bacteria, UA: NONE SEEN
Bilirubin Urine: NEGATIVE
Glucose, UA: NEGATIVE mg/dL
Hgb urine dipstick: NEGATIVE
Ketones, ur: 5 mg/dL — AB
Leukocytes,Ua: NEGATIVE
Nitrite: NEGATIVE
Protein, ur: NEGATIVE mg/dL
Specific Gravity, Urine: 1.027 (ref 1.005–1.030)
pH: 5 (ref 5.0–8.0)

## 2020-11-21 LAB — RESP PANEL BY RT-PCR (FLU A&B, COVID) ARPGX2
Influenza A by PCR: NEGATIVE
Influenza B by PCR: NEGATIVE
SARS Coronavirus 2 by RT PCR: NEGATIVE

## 2020-11-21 LAB — CBC WITH DIFFERENTIAL/PLATELET
Abs Immature Granulocytes: 0.05 10*3/uL (ref 0.00–0.07)
Basophils Absolute: 0 10*3/uL (ref 0.0–0.1)
Basophils Relative: 0 %
Eosinophils Absolute: 0 10*3/uL (ref 0.0–0.5)
Eosinophils Relative: 0 %
HCT: 18.6 % — ABNORMAL LOW (ref 39.0–52.0)
Hemoglobin: 6.1 g/dL — ABNORMAL LOW (ref 13.0–17.0)
Immature Granulocytes: 1 %
Lymphocytes Relative: 47 %
Lymphs Abs: 4.3 10*3/uL — ABNORMAL HIGH (ref 0.7–4.0)
MCH: 34.1 pg — ABNORMAL HIGH (ref 26.0–34.0)
MCHC: 32.8 g/dL (ref 30.0–36.0)
MCV: 103.9 fL — ABNORMAL HIGH (ref 80.0–100.0)
Monocytes Absolute: 0.9 10*3/uL (ref 0.1–1.0)
Monocytes Relative: 10 %
Neutro Abs: 3.8 10*3/uL (ref 1.7–7.7)
Neutrophils Relative %: 42 %
Platelets: 115 10*3/uL — ABNORMAL LOW (ref 150–400)
RBC: 1.79 MIL/uL — ABNORMAL LOW (ref 4.22–5.81)
RDW: 23.8 % — ABNORMAL HIGH (ref 11.5–15.5)
WBC: 9.1 10*3/uL (ref 4.0–10.5)
nRBC: 0 % (ref 0.0–0.2)

## 2020-11-21 LAB — LACTIC ACID, PLASMA: Lactic Acid, Venous: 0.7 mmol/L (ref 0.5–1.9)

## 2020-11-21 LAB — PROTIME-INR
INR: 1.3 — ABNORMAL HIGH (ref 0.8–1.2)
Prothrombin Time: 16.2 seconds — ABNORMAL HIGH (ref 11.4–15.2)

## 2020-11-21 LAB — IRON AND TIBC
Iron: 84 ug/dL (ref 45–182)
Saturation Ratios: 31 % (ref 17.9–39.5)
TIBC: 269 ug/dL (ref 250–450)
UIBC: 185 ug/dL

## 2020-11-21 LAB — PREPARE RBC (CROSSMATCH)

## 2020-11-21 LAB — APTT: aPTT: 25 seconds (ref 24–36)

## 2020-11-21 MED ORDER — SODIUM CHLORIDE 0.9 % IV SOLN
10.0000 mL/h | Freq: Once | INTRAVENOUS | Status: AC
  Administered 2020-11-21: 10 mL/h via INTRAVENOUS

## 2020-11-21 MED ORDER — ONDANSETRON HCL 4 MG/2ML IJ SOLN: 4.0000 mg | Freq: Four times a day (QID) | INTRAMUSCULAR | Status: DC | PRN

## 2020-11-21 MED ORDER — PANTOPRAZOLE SODIUM 40 MG IV SOLR
40.0000 mg | Freq: Two times a day (BID) | INTRAVENOUS | Status: DC
  Administered 2020-11-22 – 2020-11-23 (×4): 40 mg via INTRAVENOUS
  Filled 2020-11-21 (×5): qty 40

## 2020-11-21 MED ORDER — MIRTAZAPINE 15 MG PO TABS
30.0000 mg | ORAL_TABLET | Freq: Every day | ORAL | Status: DC
  Administered 2020-11-21 – 2020-11-24 (×4): 30 mg via ORAL
  Filled 2020-11-21 (×4): qty 2

## 2020-11-21 MED ORDER — PANTOPRAZOLE SODIUM 40 MG IV SOLR: 40.0000 mg | INTRAVENOUS | Status: DC

## 2020-11-21 MED ORDER — ONDANSETRON HCL 4 MG PO TABS: 4.0000 mg | ORAL_TABLET | Freq: Four times a day (QID) | ORAL | Status: DC | PRN

## 2020-11-21 MED ORDER — OXYCODONE HCL 5 MG PO TABS
10.0000 mg | ORAL_TABLET | ORAL | Status: DC | PRN
  Administered 2020-11-21 – 2020-11-25 (×11): 10 mg via ORAL
  Filled 2020-11-21 (×11): qty 2

## 2020-11-21 MED ORDER — METHOCARBAMOL 500 MG PO TABS
500.0000 mg | ORAL_TABLET | Freq: Four times a day (QID) | ORAL | Status: DC | PRN
  Filled 2020-11-21: qty 1

## 2020-11-21 MED ORDER — NICOTINE 14 MG/24HR TD PT24
14.0000 mg | MEDICATED_PATCH | Freq: Every day | TRANSDERMAL | Status: DC
  Administered 2020-11-21 – 2020-11-24 (×4): 14 mg via TRANSDERMAL
  Filled 2020-11-21 (×5): qty 1

## 2020-11-21 MED ORDER — IOHEXOL 350 MG/ML SOLN
50.0000 mL | Freq: Once | INTRAVENOUS | Status: AC | PRN
  Administered 2020-11-21: 50 mL via INTRAVENOUS

## 2020-11-21 MED ORDER — PANTOPRAZOLE SODIUM 40 MG IV SOLR
40.0000 mg | Freq: Once | INTRAVENOUS | Status: AC
  Administered 2020-11-21: 40 mg via INTRAVENOUS
  Filled 2020-11-21: qty 40

## 2020-11-21 MED ORDER — POTASSIUM CHLORIDE CRYS ER 20 MEQ PO TBCR
40.0000 meq | EXTENDED_RELEASE_TABLET | Freq: Once | ORAL | Status: AC
  Administered 2020-11-21: 40 meq via ORAL
  Filled 2020-11-21: qty 2

## 2020-11-21 MED ORDER — GABAPENTIN 300 MG PO CAPS
300.0000 mg | ORAL_CAPSULE | Freq: Two times a day (BID) | ORAL | Status: DC
  Administered 2020-11-21 – 2020-11-25 (×8): 300 mg via ORAL
  Filled 2020-11-21 (×8): qty 1

## 2020-11-21 MED ORDER — FOLIC ACID 1 MG PO TABS
1.0000 mg | ORAL_TABLET | Freq: Every day | ORAL | Status: DC
  Administered 2020-11-22 – 2020-11-25 (×4): 1 mg via ORAL
  Filled 2020-11-21 (×4): qty 1

## 2020-11-21 MED ORDER — NALOXEGOL OXALATE 25 MG PO TABS
25.0000 mg | ORAL_TABLET | Freq: Every day | ORAL | Status: DC
Start: 2020-11-21 — End: 2020-11-25
  Administered 2020-11-22 – 2020-11-25 (×4): 25 mg via ORAL
  Filled 2020-11-21 (×5): qty 1

## 2020-11-21 MED ORDER — IBRUTINIB 420 MG PO TABS: 420.0000 mg | ORAL_TABLET | Freq: Every day | ORAL | Status: DC

## 2020-11-21 MED ORDER — DEXAMETHASONE 0.5 MG PO TABS
2.0000 mg | ORAL_TABLET | Freq: Two times a day (BID) | ORAL | Status: DC
  Administered 2020-11-21 – 2020-11-22 (×2): 2 mg via ORAL
  Filled 2020-11-21 (×3): qty 4

## 2020-11-21 MED ORDER — SODIUM CHLORIDE 0.9 % IV SOLN
INTRAVENOUS | Status: DC
  Administered 2020-11-23: 1000 mL via INTRAVENOUS

## 2020-11-21 MED ORDER — SODIUM CHLORIDE 0.9 % IV BOLUS
1000.0000 mL | Freq: Once | INTRAVENOUS | Status: AC
  Administered 2020-11-21: 1000 mL via INTRAVENOUS

## 2020-11-21 NOTE — Telephone Encounter (Signed)
Son called reporting that patient was visited by  friend who was concerned about patient state of health and EMS was called and patient refused to be examined or go to ER. Patient sister hen went over to see patient and again called EMS and patient let them check him and found his bp was low in 70's over 50 and patient agreed to go to ER this time. Son wanted [place] to be aware of this

## 2020-11-21 NOTE — H&P (Signed)
History and Physical    James [name] JME:[id number] DOB: January 16, 1951 DOA: 11/21/2020  PCP: [name], MD   Patient coming from: Home  I have personally briefly reviewed patient's old medical records in [place]  Chief Complaint: Weakness  HPI: James [name] is a 70 y.o. male with medical history significant for CLL on chemotherapy, opioid-induced constipation, diabetes mellitus, depression, GERD who presents to the emergency room via EMS for evaluation of weakness, dizziness, poor oral intake and lethargy.  He admits to having multiple falls over the last couple of days. He states that his appetite has been very poor and his last bowel movement was the day prior to his admission.  His stools have been brown and he denies having any nausea, no vomiting, no hematochezia, no hematemesis or melena stools.  He complains of pain in the epigastric area and left upper quadrant.  He rates his pain a 7 x 10 in intensity at its worst.  Pain is nonradiating but according to the patient it is chronic. Per EMS he was hypotensive with blood pressure 70/50 and he was transported to the emergency room for further evaluation. He complains of shortness of breath with mild exertion but denies having any chest pain, no palpitations, no diaphoresis, no lower extremity swelling, no changes in his bowel habits, no fever, no chills, no cough, no urinary symptoms. Labs show sodium 136, potassium 3.3, chloride 105, bicarb 22, glucose 101, BUN 49, creatinine 1.58, calcium 8.1, alkaline phosphatase 51, albumin 3.7, AST 12, ALT 14, total protein 5.5, total bilirubin 2.2, lactic acid 0.7, white count 9.1, hemoglobin 6.1 compared to 8.2 on 07/22, hematocrit 18.6, MCV 103.9, RDW 23.8, platelet count 115, PT 16.2, INR 1.3 Respiratory viral panel is negative Chest x-ray reviewed by me shows no evidence of acute cardiopulmonary disease. CT scan of abdomen and pelvis shows no evidence of acute bowel pathology.   No evidence of perforation.  Moderate amount of gas and fecal matter in the colon, possibly due to opioid use.  Spleen upper limits of normal in size.  Retroperitoneal lymphadenopathy, similar to the study of September, 2021. Twelve-lead EKG reviewed by me shows sinus tachycardia.  ED Course: Patient is a 70 year old male with a history of CLL on chemotherapy who presents to the ER via EMS for evaluation of weakness, lethargy, frequent falls, dizziness and poor oral intake. He also complains of pain in the epigastrium and left upper quadrant. Patient is noted to have a drop in his H&H from 8.2g/dl  on November 18, 2018 to 6.1g/dl on November 21, 2020. Stool was brown but mildly guaiac positive Patient was hypotensive upon arrival to the ER and received fluid boluses with improvement in his blood pressure. He will be transfused a unit of packed RBC. Patient will be referred to observation status for further evaluation.   Review of Systems: As per HPI otherwise all other systems reviewed and negative.    Past Medical History:  Diagnosis Date   CLL (chronic lymphocytic leukemia) (HCC)    Constipation    Depression    Diabetes mellitus without complication (HCC)    GERD (gastroesophageal reflux disease)    Hematuria    Hyperlipidemia    Hypertension    Therapeutic opioid induced constipation     Past Surgical History:  Procedure Laterality Date   CATARACT EXTRACTION W/ INTRAOCULAR LENS IMPLANT Bilateral    CHOLECYSTECTOMY  1983   COLONOSCOPY WITH PROPOFOL N/A 10/27/2017   Procedure: COLONOSCOPY WITH PROPOFOL;  Surgeon: Manya Silvas, MD;  Location: [facility] ENDOSCOPY;  Service: Endoscopy;  Laterality: N/A;   ESOPHAGOGASTRODUODENOSCOPY (EGD) WITH PROPOFOL N/A 11/20/2016   Procedure: ESOPHAGOGASTRODUODENOSCOPY (EGD) WITH PROPOFOL;  Surgeon: Manya Silvas, MD;  Location: [facility] ENDOSCOPY;  Service: Endoscopy;  Laterality: N/A;   ESOPHAGOGASTRODUODENOSCOPY (EGD) WITH PROPOFOL N/A 10/27/2017    Procedure: ESOPHAGOGASTRODUODENOSCOPY (EGD) WITH PROPOFOL;  Surgeon: Manya Silvas, MD;  Location: [facility] ENDOSCOPY;  Service: Endoscopy;  Laterality: N/A;   INTRATHECAL PUMP IMPLANT Left 02/07/2020   Procedure: INTRATHECAL PUMP & CATHETER IMPLANT;  Surgeon: Deetta Perla, MD;  Location: [facility] ORS;  Service: Neurosurgery;  Laterality: Left;   LOWER EXTREMITY ANGIOGRAPHY Right 05/19/2017   Procedure: LOWER EXTREMITY ANGIOGRAPHY;  Surgeon: Algernon Huxley, MD;  Location: [place] CV LAB;  Service: Cardiovascular;  Laterality: Right;   LOWER EXTREMITY ANGIOGRAPHY Left 08/10/2018   Procedure: LOWER EXTREMITY ANGIOGRAPHY;  Surgeon: Algernon Huxley, MD;  Location: [place] [facility] LAB;  Service: Cardiovascular;  Laterality: Left;     reports that he has been smoking cigarettes. He has a 23.50 pack-year smoking history. He has never used smokeless tobacco. He reports current alcohol use of about 1.0 standard drink of alcohol per week. He reports that he does not use drugs.  No Known Allergies  Family History  Problem Relation Age of Onset   Hypertension Sister       Prior to Admission medications   Medication Sig Start Date End Date Taking? Authorizing Provider  AMBULATORY NON FORMULARY MEDICATION Medication Name: Intrathecal pump Bupivacaine 20.0 mg/ml Fentanyl 2,000.0 mcg/ml Dose 1846.4 mcg/day    [provider]  clopidogrel (PLAVIX) 75 MG tablet Take 75 mg by mouth daily.  02/12/20   [provider]  dexamethasone (DECADRON) 2 MG tablet Take 1 tablet (2 mg total) by mouth 2 (two) times daily with a meal. 11/17/20   Cammie Sickle, MD  Ensure (ENSURE) Take 237 mLs by mouth 3 (three) times daily between meals.     [provider]  folic acid (FOLVITE) 1 MG tablet Take 1 tablet (1 mg total) by mouth daily. 11/09/19   Cammie Sickle, MD  gabapentin (NEURONTIN) 300 MG capsule Take 3 capsules (900 mg total) by mouth at bedtime AND 1 capsule (300 mg total) 2  (two) times daily. 07/31/20 10/29/20  Milinda Pointer, MD  ibrutinib 420 MG TABS Take 420 mg by mouth daily. 08/31/20   Cammie Sickle, MD  metFORMIN (GLUCOPHAGE) 1000 MG tablet Take 1 tablet (1,000 mg total) by mouth daily with breakfast. 10/04/20   Jacquelin Hawking, NP  methocarbamol (ROBAXIN) 500 MG tablet Take 1 tablet (500 mg total) by mouth every 6 (six) hours as needed for muscle spasms. 02/07/20   Lonell Face, NP  mirtazapine (REMERON) 30 MG tablet Take 1 tablet (30 mg total) by mouth at bedtime. 07/18/20   Cammie Sickle, MD  naloxegol oxalate (MOVANTIK) 25 MG TABS tablet Take 1 tablet (25 mg total) by mouth daily. 09/28/20 12/27/20  Milinda Pointer, MD  naloxone [facility]) 2 MG/2ML injection Inject 1 mL (1 mg total) into the muscle as needed for up to 2 doses (for opioid overdose). In case of emergency (overdose), inject into muscle of upper arm or leg and call 911. 03/02/20   Milinda Pointer, MD  Oxycodone HCl 10 MG TABS Take 1 tablet (10 mg total) by mouth every 4 (four) hours as needed. Must last 30 days 09/09/20 10/20/20  Milinda Pointer, MD  Oxycodone HCl 10  MG TABS Take 1 tablet (10 mg total) by mouth every 4 (four) hours as needed. Must last 30 days 10/09/20 11/08/20  Milinda Pointer, MD  Oxycodone HCl 10 MG TABS Take 1 tablet (10 mg total) by mouth every 4 (four) hours as needed. Must last 30 days 11/08/20 12/08/20  Milinda Pointer, MD  Oxycodone HCl 10 MG TABS Take 1 tablet (10 mg total) by mouth every 4 (four) hours as needed. Must last 30 days 12/08/20 01/07/21  Milinda Pointer, MD  pantoprazole (PROTONIX) 40 MG tablet Take 40 mg by mouth at bedtime. 12/24/19   [provider]    Physical Exam: Vitals:   11/21/20 1342 11/21/20 1400 11/21/20 1430 11/21/20 1441  BP:  114/69 115/68   Pulse:  95 83   Resp:  20 15   Temp:    99.1 F (37.3 C)  TempSrc:    Rectal  SpO2:  99% 99%   Weight: 62.6 kg     Height: 5\' 8"  (1.727 m)        Vitals:    11/21/20 1342 11/21/20 1400 11/21/20 1430 11/21/20 1441  BP:  114/69 115/68   Pulse:  95 83   Resp:  20 15   Temp:    99.1 F (37.3 C)  TempSrc:    Rectal  SpO2:  99% 99%   Weight: 62.6 kg     Height: 5\' 8"  (1.727 m)         Constitutional: Alert and oriented x 3 . Not in any apparent distress.  Frail and chronically ill-appearing.  Generalized pallor HEENT:      Head: Normocephalic and atraumatic.         Eyes: PERLA, EOMI, Conjunctivae are pale. Sclera is non-icteric.       Mouth/Throat: Mucous membranes are dry.       Neck: Supple with no signs of meningismus. Cardiovascular: Tachycardic. No murmurs, gallops, or rubs. 2+ symmetrical distal pulses are present . No JVD. No LE edema Respiratory: Respiratory effort normal .Lungs sounds clear bilaterally. No wheezes, crackles, or rhonchi.  Gastrointestinal: Soft, epigastric tenderness, and non distended with positive bowel sounds.  Genitourinary: No CVA tenderness. Musculoskeletal: Nontender with normal range of motion in all extremities.  Equal pulses over both forearms and lower extremities Neurologic:  Face is symmetric. Moving all extremities. No gross focal neurologic deficits . Skin: Skin is warm, dry.  No rash or ulcers Psychiatric: Mood and affect are normal    Labs on Admission: I have personally reviewed following labs and imaging studies  CBC: Recent Labs  Lab 11/17/20 0824 11/17/20 2154 11/21/20 1400  WBC 12.6* 7.2 9.1  NEUTROABS 2.7  --  3.8  HGB 8.2* 6.2* 6.1*  HCT 25.1* 19.3* 18.6*  MCV 102.4* 106.0* 103.9*  PLT 204 153 831*   Basic Metabolic Panel: Recent Labs  Lab 11/17/20 0824 11/17/20 2154 11/21/20 1400  NA 142 141 136  K 5.3* 4.4 3.3*  CL 104 115* 105  CO2 23 20* 22  GLUCOSE 126* 113* 101*  BUN 39* 33* 49*  CREATININE 1.60* 1.07 1.58*  CALCIUM 9.6 7.3* 8.1*   GFR: Estimated Creatinine Clearance: 38 mL/min (A) (by C-G formula based on SCr of 1.58 mg/dL (H)). Liver Function Tests: Recent  Labs  Lab 11/17/20 0824 11/17/20 2154 11/21/20 1400  AST 23 20 12*  ALT 26 15 14   ALKPHOS 59 40 51  BILITOT 1.4* 1.5* 2.2*  PROT 6.9 4.9* 5.5*  ALBUMIN 4.5 3.2* 3.7  No results for input(s): LIPASE, AMYLASE in the last 168 hours. No results for input(s): AMMONIA in the last 168 hours. Coagulation Profile: Recent Labs  Lab 11/21/20 1400  INR 1.3*   Cardiac Enzymes: No results for input(s): CKTOTAL, CKMB, CKMBINDEX, TROPONINI in the last 168 hours. BNP (last 3 results) No results for input(s): PROBNP in the last 8760 hours. HbA1C: No results for input(s): HGBA1C in the last 72 hours. CBG: No results for input(s): GLUCAP in the last 168 hours. Lipid Profile: No results for input(s): CHOL, HDL, LDLCALC, TRIG, CHOLHDL, LDLDIRECT in the last 72 hours. Thyroid Function Tests: No results for input(s): TSH, T4TOTAL, FREET4, T3FREE, THYROIDAB in the last 72 hours. Anemia Panel: No results for input(s): VITAMINB12, FOLATE, FERRITIN, TIBC, IRON, RETICCTPCT in the last 72 hours. Urine analysis:    Component Value Date/Time   COLORURINE YELLOW (A) 11/21/2020 1624   APPEARANCEUR HAZY (A) 11/21/2020 1624   LABSPEC 1.027 11/21/2020 1624   PHURINE 5.0 11/21/2020 1624   GLUCOSEU NEGATIVE 11/21/2020 1624   HGBUR NEGATIVE 11/21/2020 1624   BILIRUBINUR NEGATIVE 11/21/2020 1624   KETONESUR 5 (A) 11/21/2020 1624   PROTEINUR NEGATIVE 11/21/2020 1624   NITRITE NEGATIVE 11/21/2020 1624   LEUKOCYTESUR NEGATIVE 11/21/2020 1624    Radiological Exams on Admission: CT ABDOMEN PELVIS W CONTRAST  Result Date: 11/21/2020 CLINICAL DATA:  Acute nonlocalized epigastric pain. Weakness and lethargy. Question perforation. Being treated for leukemia. EXAM: CT ABDOMEN AND PELVIS WITH CONTRAST TECHNIQUE: Multidetector CT imaging of the abdomen and pelvis was performed using the standard protocol following bolus administration of intravenous contrast. CONTRAST:  23mL OMNIPAQUE IOHEXOL 350 MG/ML SOLN  COMPARISON:  01/24/2020 FINDINGS: Lower chest: Lung bases are clear.  No pleural or pericardial fluid. Hepatobiliary: Previous cholecystectomy. No significant liver parenchymal finding. Mild ductal prominence, within normal limits following cholecystectomy. Pancreas: Normal Spleen: Spleen upper limits of normal in size.  No focal lesion. Adrenals/Urinary Tract: Adrenal glands are normal. There are bilateral renal cysts. No evidence of mass or hydronephrosis. One or 2 punctate nonobstructing stones in the lower pole of the right kidney. Stomach/Bowel: Stomach is normal. Small bowel is normal. Moderate amount of gas and fecal matter in the colon without evidence of inflammatory disease or frank obstruction. Normal appearing appendix. Vascular/Lymphatic: Aortic atherosclerosis. No aneurysm. IVC is normal. Retroperitoneal adenopathy is similar to the previous study, the largest nodal mass on the left at the level of the kidney measuring 4.2 x 2.8 cm. Other retroperitoneal nodes similarly stable. Reproductive: Enlarged prostate. Other: No free fluid or air. Musculoskeletal: No fracture or focal bone lesion. Neurostimulator in place, entering at the L3-4 level and extending into the thoracic region. IMPRESSION: No evidence of acute bowel pathology. No evidence of perforation as questioned clinically. Moderate amount of gas and fecal matter in the colon, possibly due to opioid use. Spleen upper limits of normal in size. Retroperitoneal lymphadenopathy, similar to the study of September 2021. Enlarged prostate. Aortic Atherosclerosis (ICD10-I70.0). Electronically Signed   By: Nelson Chimes M.D.   On: 11/21/2020 15:26   DG Chest Port 1 View  Result Date: 11/21/2020 CLINICAL DATA:  Questionable sepsis. Weakness. Lethargy. Being treated for leukemia. EXAM: PORTABLE CHEST 1 VIEW COMPARISON:  09/12/2019 FINDINGS: The heart size and mediastinal contours are within normal limits. Ordinary age related aortic atherosclerotic  calcification. Both lungs are clear. The visualized skeletal structures are unremarkable. IMPRESSION: No active disease. Electronically Signed   By: Nelson Chimes M.D.   On: 11/21/2020 15:20  Assessment/Plan Principal Problem:   Symptomatic anemia Active Problems:   AVM (arteriovenous malformation) of duodenum, acquired with hemorrhage   Current smoker   Gastroesophageal reflux disease without esophagitis   Hypokalemia      Symptomatic anemia Most likely from acute blood loss of unclear etiology rule out peptic ulcer disease Patient has had a 2 g drop in his H&H over the last couple of days and is symptomatic as evidenced by hypotension, dizziness, tachycardia and falls at home. Obtain iron panel We will transfuse 1 unit of packed RBC Aggressive IV fluid resuscitation We will consult gastroenterology Hold Plavix     History of CLL Continue Decadron and Ibrutinib     Nicotine dependence Smoking cessation has been discussed with patient in detail We will place patient on a nicotine transdermal patch 14 mg daily     Hypokalemia Supplement potassium    Diabetes mellitus Patient is on a clear liquid diet Blood sugar checks 3 times daily  DVT prophylaxis: SCD  Code Status: DO NOT RESUSCITATE Family Communication: Greater than 50% of time was spent discussing patient's condition and plan of care with him at the bedside.  All questions and concerns have been addressed.  He verbalizes understanding and agrees with the plan.  CODE STATUS was discussed and he is a DNR. Disposition Plan: Back to previous home environment Consults called: Gastroenterology/oncology Status: Observation    Reyanna Baley MD [facility]     11/21/2020, 5:05 PM

## 2020-11-21 NOTE — ED Provider Notes (Signed)
[facility] Emergency Department Provider Note    Event Date/Time   First MD Initiated Contact with Patient 11/21/20 1348     (approximate)  I have reviewed the triage vital signs and the nursing notes.   HISTORY  Chief Complaint Altered Mental Status (Started this morning, lethargy. Leukemia treatment )    HPI James [name] is a 70 y.o. male below listed past medical history on chemotherapy last treatment being Friday presents to the ER for generalized malaise abdominal pain.  Has had poor p.o. intake.  Describing epigastric and left-sided abdominal pain.  No measured fevers at home has had chills and malaise.  EMS remodeler found the patient to be hypotensive patient refused transport.  Became increasingly weak and had repeat evaluations found to be hypotensive with systolics 74B over 50 and agreed to come to the ER.  Past Medical History:  Diagnosis Date   CLL (chronic lymphocytic leukemia) (HCC)    Constipation    Depression    Diabetes mellitus without complication (HCC)    GERD (gastroesophageal reflux disease)    Hematuria    Hyperlipidemia    Hypertension    Therapeutic opioid induced constipation    Family History  Problem Relation Age of Onset   Hypertension Sister    Past Surgical History:  Procedure Laterality Date   CATARACT EXTRACTION W/ INTRAOCULAR LENS IMPLANT Bilateral    CHOLECYSTECTOMY  1983   COLONOSCOPY WITH PROPOFOL N/A 10/27/2017   Procedure: COLONOSCOPY WITH PROPOFOL;  Surgeon: Manya Silvas, MD;  Location: [facility] ENDOSCOPY;  Service: Endoscopy;  Laterality: N/A;   ESOPHAGOGASTRODUODENOSCOPY (EGD) WITH PROPOFOL N/A 11/20/2016   Procedure: ESOPHAGOGASTRODUODENOSCOPY (EGD) WITH PROPOFOL;  Surgeon: Manya Silvas, MD;  Location: [facility] ENDOSCOPY;  Service: Endoscopy;  Laterality: N/A;   ESOPHAGOGASTRODUODENOSCOPY (EGD) WITH PROPOFOL N/A 10/27/2017   Procedure: ESOPHAGOGASTRODUODENOSCOPY (EGD) WITH PROPOFOL;  Surgeon: Manya Silvas, MD;  Location: [facility] ENDOSCOPY;  Service: Endoscopy;  Laterality: N/A;   INTRATHECAL PUMP IMPLANT Left 02/07/2020   Procedure: INTRATHECAL PUMP & CATHETER IMPLANT;  Surgeon: Deetta Perla, MD;  Location: [facility] ORS;  Service: Neurosurgery;  Laterality: Left;   LOWER EXTREMITY ANGIOGRAPHY Right 05/19/2017   Procedure: LOWER EXTREMITY ANGIOGRAPHY;  Surgeon: Algernon Huxley, MD;  Location: [place] CV LAB;  Service: Cardiovascular;  Laterality: Right;   LOWER EXTREMITY ANGIOGRAPHY Left 08/10/2018   Procedure: LOWER EXTREMITY ANGIOGRAPHY;  Surgeon: Algernon Huxley, MD;  Location: [place] CV LAB;  Service: Cardiovascular;  Laterality: Left;   Patient Active Problem List   Diagnosis Date Noted   Therapeutic opioid-induced constipation (OIC) 09/28/2020   Non-traumatic compression fracture of T2 thoracic vertebra, sequela 04/17/2020   Non-traumatic compression fracture of T1 thoracic vertebra, sequela 04/17/2020   Non-traumatic compression fracture of T3 thoracic vertebra, sequela 04/17/2020   Chronic neuropathic pain 04/11/2020   Non compliance w medication regimen 03/09/2020   History of high risk medication treatment 03/02/2020   Medication care plan discussed with patient 03/02/2020   Pain medication agreement discussed 03/02/2020   Pain medication agreement signed 03/02/2020   Opiate use 03/02/2020   Physical tolerance to opiate drug 03/02/2020   Encounter for adjustment and management of infusion pump 02/29/2020   Presence of implanted infusion pump ([employer]tronic) 02/15/2020   Presence of intrathecal pump ([employer]tronic) 02/15/2020   DDD (degenerative disc disease), thoracic 07/12/2019   DDD (degenerative disc disease), lumbosacral 07/12/2019   Lumbar facet hypertrophy (Multilevel) 07/12/2019   Cancer-related pain 07/12/2019   Elevated sed rate  07/12/2019   Abdominal wall pain in epigastric region 06/15/2019   Thoracic radiculitis 06/15/2019   Right-sided low back pain without  sciatica 06/15/2019   Bradycardia, unspecified 06/01/2019   Hypotension 06/01/2019   Chronic anticoagulation (Plavix) 05/19/2019   Vitamin D deficiency 05/19/2019   Chronic pain syndrome 05/03/2019   Pharmacologic therapy 05/03/2019   Disorder of skeletal system 05/03/2019   Problems influencing health status 05/03/2019   Chronic abdominal pain 05/03/2019   Elevated uric acid in blood 05/03/2019   Chronic prescription opiate use 05/03/2019   Enlarged prostate 38/88/2800   Folic acid deficiency 34/91/7915   Major depressive disorder, recurrent, mild ([place]) 03/08/2019   Shortness of breath 12/28/2018   Acute on chronic renal failure ([place]) 12/28/2018   SOB (shortness of breath) 12/28/2018   Portal hypertensive gastropathy ([place]) 09/25/2018   CKD stage 3 secondary to diabetes ([place]) 08/04/2018   Atherosclerosis of native arteries of the extremities with ulceration ([place]) 08/04/2018   Stage 3 chronic kidney disease ([place]) 08/04/2018   Anemia due to stage 3 chronic kidney disease ([place]) 04/15/2018   Gastroesophageal reflux disease without esophagitis 08/22/2017   Chronic epigastric pain (1ry area of Pain) 08/21/2017   Hx of adenomatous colonic polyps 08/21/2017   Atherosclerosis of native arteries of extremity with rest pain ([place]) 05/13/2017   Hyperlipidemia 05/06/2017   Atherosclerotic peripheral vascular disease with intermittent claudication ([place]) 05/06/2017   Toe cyanosis 05/06/2017   AVM (arteriovenous malformation) of duodenum, acquired with hemorrhage 12/12/2016   Thrombocytopenia ([place]) 12/12/2016   Anemia 10/16/2016   Current smoker 10/16/2016   Malignant lymphoma, small lymphocytic ([place]) 03/25/2016   Generalized abdominal pain 12/15/2015   CLL (chronic lymphocytic leukemia) ([place]) 12/26/2013   Diabetes mellitus type 2, uncomplicated ([place]) 05/69/7948   Hypertension 12/26/2013      Prior to Admission medications   Medication Sig Start Date End Date Taking? Authorizing Provider   AMBULATORY NON FORMULARY MEDICATION Medication Name: Intrathecal pump Bupivacaine 20.0 mg/ml Fentanyl 2,000.0 mcg/ml Dose 1846.4 mcg/day    [provider]  clopidogrel (PLAVIX) 75 MG tablet Take 75 mg by mouth daily.  02/12/20   [provider]  dexamethasone (DECADRON) 2 MG tablet Take 1 tablet (2 mg total) by mouth 2 (two) times daily with a meal. 11/17/20   Cammie Sickle, MD  Ensure (ENSURE) Take 237 mLs by mouth 3 (three) times daily between meals.     [provider]  folic acid (FOLVITE) 1 MG tablet Take 1 tablet (1 mg total) by mouth daily. 11/09/19   Cammie Sickle, MD  gabapentin (NEURONTIN) 300 MG capsule Take 3 capsules (900 mg total) by mouth at bedtime AND 1 capsule (300 mg total) 2 (two) times daily. 07/31/20 10/29/20  Milinda Pointer, MD  ibrutinib 420 MG TABS Take 420 mg by mouth daily. 08/31/20   Cammie Sickle, MD  metFORMIN (GLUCOPHAGE) 1000 MG tablet Take 1 tablet (1,000 mg total) by mouth daily with breakfast. 10/04/20   Jacquelin Hawking, NP  methocarbamol (ROBAXIN) 500 MG tablet Take 1 tablet (500 mg total) by mouth every 6 (six) hours as needed for muscle spasms. 02/07/20   Lonell Face, NP  mirtazapine (REMERON) 30 MG tablet Take 1 tablet (30 mg total) by mouth at bedtime. 07/18/20   Cammie Sickle, MD  naloxegol oxalate (MOVANTIK) 25 MG TABS tablet Take 1 tablet (25 mg total) by mouth daily. 09/28/20 12/27/20  Milinda Pointer, MD  naloxone [facility]) 2 MG/2ML injection Inject 1 mL (1 mg  total) into the muscle as needed for up to 2 doses (for opioid overdose). In case of emergency (overdose), inject into muscle of upper arm or leg and call 911. 03/02/20   Milinda Pointer, MD  Oxycodone HCl 10 MG TABS Take 1 tablet (10 mg total) by mouth every 4 (four) hours as needed. Must last 30 days 09/09/20 10/20/20  Milinda Pointer, MD  Oxycodone HCl 10 MG TABS Take 1 tablet (10 mg total) by mouth every 4 (four) hours as needed. Must  last 30 days 10/09/20 11/08/20  Milinda Pointer, MD  Oxycodone HCl 10 MG TABS Take 1 tablet (10 mg total) by mouth every 4 (four) hours as needed. Must last 30 days 11/08/20 12/08/20  Milinda Pointer, MD  Oxycodone HCl 10 MG TABS Take 1 tablet (10 mg total) by mouth every 4 (four) hours as needed. Must last 30 days 12/08/20 01/07/21  Milinda Pointer, MD  pantoprazole (PROTONIX) 40 MG tablet Take 40 mg by mouth at bedtime. 12/24/19   [provider]    Allergies Patient has no known allergies.    Social History Social History   Tobacco Use   Smoking status: Every Day    Packs/day: 0.50    Years: 47.00    Pack years: 23.50    Types: Cigarettes   Smokeless tobacco: Never  Vaping Use   Vaping Use: Never used  Substance Use Topics   Alcohol use: Yes    Alcohol/week: 1.0 standard drink    Types: 1 Glasses of wine per week    Comment:  almost none in last 6 months   Drug use: No    Review of Systems Patient denies headaches, rhinorrhea, blurry vision, numbness, shortness of breath, chest pain, edema, cough, abdominal pain, nausea, vomiting, diarrhea, dysuria, fevers, rashes or hallucinations unless otherwise stated above in HPI. ____________________________________________   PHYSICAL EXAM:  VITAL SIGNS: Vitals:   11/21/20 1338 11/21/20 1441  BP: 114/69   Pulse: 92   Resp: 18   Temp: 99.1 F (37.3 C) 99.1 F (37.3 C)  SpO2: 99%     Constitutional: Alert and oriented. Frail appearing Eyes: Conjunctivae are normal.  Head: Atraumatic. Nose: No congestion/rhinnorhea. Mouth/Throat: Mucous membranes are moist.   Neck: No stridor. Painless ROM.  Cardiovascular: Normal rate, regular rhythm. Grossly normal heart sounds.  Good peripheral circulation. Respiratory: Normal respiratory effort.  No retractions. Lungs CTAB. Gastrointestinal: Soft and nontender. No distention. No abdominal bruits. No CVA tenderness. Genitourinary: weakly positive, light brown  stool Musculoskeletal: No lower extremity tenderness nor edema.  No joint effusions. Neurologic:  Normal speech and language. No gross focal neurologic deficits are appreciated. No facial droop Skin:  Skin is warm, dry and intact. No rash noted. Psychiatric: Mood and affect are normal. Speech and behavior are normal.  ____________________________________________   LABS (all labs ordered are listed, but only abnormal results are displayed)  Results for orders placed or performed during the hospital encounter of 11/21/20 (from the past 24 hour(s))  Lactic acid, plasma     Status: None   Collection Time: 11/21/20  2:00 PM  Result Value Ref Range   Lactic Acid, Venous 0.7 0.5 - 1.9 mmol/L  Comprehensive metabolic panel     Status: Abnormal   Collection Time: 11/21/20  2:00 PM  Result Value Ref Range   Sodium 136 135 - 145 mmol/L   Potassium 3.3 (L) 3.5 - 5.1 mmol/L   Chloride 105 98 - 111 mmol/L   CO2 22 22 - 32  mmol/L   Glucose, Bld 101 (H) 70 - 99 mg/dL   BUN 49 (H) 8 - 23 mg/dL   Creatinine, Ser 1.58 (H) 0.61 - 1.24 mg/dL   Calcium 8.1 (L) 8.9 - 10.3 mg/dL   Total Protein 5.5 (L) 6.5 - 8.1 g/dL   Albumin 3.7 3.5 - 5.0 g/dL   AST 12 (L) 15 - 41 U/L   ALT 14 0 - 44 U/L   Alkaline Phosphatase 51 38 - 126 U/L   Total Bilirubin 2.2 (H) 0.3 - 1.2 mg/dL   GFR, Estimated 46 (L) >60 mL/min   Anion gap 9 5 - 15  CBC WITH DIFFERENTIAL     Status: Abnormal   Collection Time: 11/21/20  2:00 PM  Result Value Ref Range   WBC 9.1 4.0 - 10.5 K/uL   RBC 1.79 (L) 4.22 - 5.81 MIL/uL   Hemoglobin 6.1 (L) 13.0 - 17.0 g/dL   HCT 18.6 (L) 39.0 - 52.0 %   MCV 103.9 (H) 80.0 - 100.0 fL   MCH 34.1 (H) 26.0 - 34.0 pg   MCHC 32.8 30.0 - 36.0 g/dL   RDW 23.8 (H) 11.5 - 15.5 %   Platelets 115 (L) 150 - 400 K/uL   nRBC 0.0 0.0 - 0.2 %   Neutrophils Relative % 42 %   Neutro Abs 3.8 1.7 - 7.7 K/uL   Lymphocytes Relative 47 %   Lymphs Abs 4.3 (H) 0.7 - 4.0 K/uL   Monocytes Relative 10 %   Monocytes  Absolute 0.9 0.1 - 1.0 K/uL   Eosinophils Relative 0 %   Eosinophils Absolute 0.0 0.0 - 0.5 K/uL   Basophils Relative 0 %   Basophils Absolute 0.0 0.0 - 0.1 K/uL   Immature Granulocytes 1 %   Abs Immature Granulocytes 0.05 0.00 - 0.07 K/uL  Resp Panel by RT-PCR (Flu A&B, Covid) Nasopharyngeal Swab     Status: None   Collection Time: 11/21/20  2:00 PM   Specimen: Nasopharyngeal Swab; Nasopharyngeal(NP) swabs in vial transport medium  Result Value Ref Range   SARS Coronavirus 2 by RT PCR NEGATIVE NEGATIVE   Influenza A by PCR NEGATIVE NEGATIVE   Influenza B by PCR NEGATIVE NEGATIVE  Protime-INR     Status: Abnormal   Collection Time: 11/21/20  2:00 PM  Result Value Ref Range   Prothrombin Time 16.2 (H) 11.4 - 15.2 seconds   INR 1.3 (H) 0.8 - 1.2  APTT     Status: None   Collection Time: 11/21/20  2:00 PM  Result Value Ref Range   aPTT 25 24 - 36 seconds  Prepare RBC (crossmatch)     Status: None (Preliminary result)   Collection Time: 11/21/20  4:00 PM  Result Value Ref Range   Order Confirmation PENDING    ____________________________________________  EKG My review and personal interpretation at Time: 13:43 Indication: weakness  Rate: 100  Rhythm: sinus Axis: normal Other: normal intervals, nonspecific t wave abn ____________________________________________  RADIOLOGY  I personally reviewed all radiographic images ordered to evaluate for the above acute complaints and reviewed radiology reports and findings.  These findings were personally discussed with the patient.  Please see medical record for radiology report.  ____________________________________________   PROCEDURES  Procedure(s) performed:  .Critical Care  Date/Time: 11/21/2020 3:39 PM Performed by: Merlyn Lot, MD Authorized by: Merlyn Lot, MD   Critical care provider statement:    Critical care time (minutes):  35   Critical care time was exclusive of:  Separately  billable procedures and  treating other patients   Critical care was necessary to treat or prevent imminent or life-threatening deterioration of the following conditions:  Circulatory failure   Critical care was time spent personally by me on the following activities:  Development of treatment plan with patient or surrogate, discussions with consultants, evaluation of patient's response to treatment, examination of patient, obtaining history from patient or surrogate, ordering and performing treatments and interventions, ordering and review of laboratory studies, ordering and review of radiographic studies, pulse oximetry, re-evaluation of patient's condition and review of old charts    Critical Care performed: yes ____________________________________________   INITIAL IMPRESSION / ASSESSMENT AND PLAN / ED COURSE  Pertinent labs & imaging results that were available during my care of the patient were reviewed by me and considered in my medical decision making (see chart for details).   DDX: sepsis, dehydration, anemia, gi bleed, perforation, obstruction  NESTOR WIENEKE is a 70 y.o. who presents to the ED with presentation as described above.  Patient frail weak appearing not hypotensive upon arrival received fluids via EMS per report.  Low-grade temperature but not febrile.  Not neutropenic.  Given additional IV fluids upon arrival.  Found to be anemic.  No melena or hematochezia.  CT imaging of the abdomen ordered as he was complaining of epigastric pain shows no acute abnormality. no cough or congestion to suggest URI.  Rectal exam with light brown stool that is weakly guaiac positive.  Hemoglobin stable from previous 4 days ago but given his hypotension and frailty will order blood transfusion discussed with hospitalist for admission.  The patient was evaluated in Emergency Department today for the symptoms described in the history of present illness. He/she was evaluated in the context of the global COVID-19 pandemic,  which necessitated consideration that the patient might be at risk for infection with the SARS-CoV-2 virus that causes COVID-19. Institutional protocols and algorithms that pertain to the evaluation of patients at risk for COVID-19 are in a state of rapid change based on information released by regulatory bodies including the CDC and federal and state organizations. These policies and algorithms were followed during the patient's care in the ED.  As part of my medical decision making, I reviewed the following data within the [place] notes reviewed and incorporated, Labs reviewed, notes from prior ED visits and [place] Controlled Substance Database   ____________________________________________   FINAL CLINICAL IMPRESSION(S) / ED DIAGNOSES  Final diagnoses:  Hypotension due to hypovolemia  Anemia, unspecified type      NEW MEDICATIONS STARTED DURING THIS VISIT:  New Prescriptions   No medications on file     Note:  This document was prepared using Dragon voice recognition software and may include unintentional dictation errors.    Merlyn Lot, MD 11/21/20 [phone]

## 2020-11-21 NOTE — ED Triage Notes (Signed)
[facility] reports pt coming from home with complaints of weakness and lethargy that started this am. Pt is being treated for Leukemia.

## 2020-11-22 DIAGNOSIS — G8929 Other chronic pain: Secondary | ICD-10-CM

## 2020-11-22 DIAGNOSIS — F17210 Nicotine dependence, cigarettes, uncomplicated: Secondary | ICD-10-CM | POA: Diagnosis present

## 2020-11-22 DIAGNOSIS — Z8249 Family history of ischemic heart disease and other diseases of the circulatory system: Secondary | ICD-10-CM | POA: Diagnosis not present

## 2020-11-22 DIAGNOSIS — D649 Anemia, unspecified: Secondary | ICD-10-CM | POA: Diagnosis present

## 2020-11-22 DIAGNOSIS — D500 Iron deficiency anemia secondary to blood loss (chronic): Secondary | ICD-10-CM | POA: Diagnosis not present

## 2020-11-22 DIAGNOSIS — K31811 Angiodysplasia of stomach and duodenum with bleeding: Secondary | ICD-10-CM | POA: Diagnosis not present

## 2020-11-22 DIAGNOSIS — R5383 Other fatigue: Secondary | ICD-10-CM | POA: Diagnosis not present

## 2020-11-22 DIAGNOSIS — E876 Hypokalemia: Secondary | ICD-10-CM | POA: Diagnosis present

## 2020-11-22 DIAGNOSIS — I70203 Unspecified atherosclerosis of native arteries of extremities, bilateral legs: Secondary | ICD-10-CM | POA: Diagnosis present

## 2020-11-22 DIAGNOSIS — Z9049 Acquired absence of other specified parts of digestive tract: Secondary | ICD-10-CM | POA: Diagnosis not present

## 2020-11-22 DIAGNOSIS — C9110 Chronic lymphocytic leukemia of B-cell type not having achieved remission: Secondary | ICD-10-CM

## 2020-11-22 DIAGNOSIS — K31819 Angiodysplasia of stomach and duodenum without bleeding: Secondary | ICD-10-CM | POA: Diagnosis not present

## 2020-11-22 DIAGNOSIS — E273 Drug-induced adrenocortical insufficiency: Secondary | ICD-10-CM | POA: Diagnosis not present

## 2020-11-22 DIAGNOSIS — E875 Hyperkalemia: Secondary | ICD-10-CM

## 2020-11-22 DIAGNOSIS — R296 Repeated falls: Secondary | ICD-10-CM | POA: Diagnosis present

## 2020-11-22 DIAGNOSIS — F32A Depression, unspecified: Secondary | ICD-10-CM

## 2020-11-22 DIAGNOSIS — D62 Acute posthemorrhagic anemia: Secondary | ICD-10-CM | POA: Diagnosis present

## 2020-11-22 DIAGNOSIS — Z95820 Peripheral vascular angioplasty status with implants and grafts: Secondary | ICD-10-CM | POA: Diagnosis not present

## 2020-11-22 DIAGNOSIS — R109 Unspecified abdominal pain: Secondary | ICD-10-CM

## 2020-11-22 DIAGNOSIS — K219 Gastro-esophageal reflux disease without esophagitis: Secondary | ICD-10-CM | POA: Diagnosis present

## 2020-11-22 DIAGNOSIS — E1151 Type 2 diabetes mellitus with diabetic peripheral angiopathy without gangrene: Secondary | ICD-10-CM | POA: Diagnosis present

## 2020-11-22 DIAGNOSIS — K5521 Angiodysplasia of colon with hemorrhage: Secondary | ICD-10-CM | POA: Diagnosis present

## 2020-11-22 DIAGNOSIS — R634 Abnormal weight loss: Secondary | ICD-10-CM

## 2020-11-22 DIAGNOSIS — I129 Hypertensive chronic kidney disease with stage 1 through stage 4 chronic kidney disease, or unspecified chronic kidney disease: Secondary | ICD-10-CM | POA: Diagnosis present

## 2020-11-22 DIAGNOSIS — G894 Chronic pain syndrome: Secondary | ICD-10-CM | POA: Diagnosis present

## 2020-11-22 DIAGNOSIS — I9589 Other hypotension: Secondary | ICD-10-CM | POA: Diagnosis present

## 2020-11-22 DIAGNOSIS — Z66 Do not resuscitate: Secondary | ICD-10-CM | POA: Diagnosis present

## 2020-11-22 DIAGNOSIS — E2740 Unspecified adrenocortical insufficiency: Secondary | ICD-10-CM | POA: Diagnosis present

## 2020-11-22 DIAGNOSIS — E43 Unspecified severe protein-calorie malnutrition: Secondary | ICD-10-CM | POA: Diagnosis present

## 2020-11-22 DIAGNOSIS — Z7902 Long term (current) use of antithrombotics/antiplatelets: Secondary | ICD-10-CM | POA: Diagnosis not present

## 2020-11-22 DIAGNOSIS — Z20822 Contact with and (suspected) exposure to covid-19: Secondary | ICD-10-CM | POA: Diagnosis present

## 2020-11-22 DIAGNOSIS — Z79899 Other long term (current) drug therapy: Secondary | ICD-10-CM | POA: Diagnosis not present

## 2020-11-22 DIAGNOSIS — E785 Hyperlipidemia, unspecified: Secondary | ICD-10-CM | POA: Diagnosis present

## 2020-11-22 DIAGNOSIS — E861 Hypovolemia: Secondary | ICD-10-CM | POA: Diagnosis present

## 2020-11-22 DIAGNOSIS — R54 Age-related physical debility: Secondary | ICD-10-CM | POA: Diagnosis present

## 2020-11-22 LAB — VITAMIN D 25 HYDROXY (VIT D DEFICIENCY, FRACTURES): Vit D, 25-Hydroxy: 31.81 ng/mL (ref 30–100)

## 2020-11-22 LAB — HEMOGLOBIN AND HEMATOCRIT, BLOOD
HCT: 20.8 % — ABNORMAL LOW (ref 39.0–52.0)
HCT: 22.6 % — ABNORMAL LOW (ref 39.0–52.0)
HCT: 28.9 % — ABNORMAL LOW (ref 39.0–52.0)
Hemoglobin: 7 g/dL — ABNORMAL LOW (ref 13.0–17.0)
Hemoglobin: 7.7 g/dL — ABNORMAL LOW (ref 13.0–17.0)
Hemoglobin: 9.6 g/dL — ABNORMAL LOW (ref 13.0–17.0)

## 2020-11-22 LAB — BASIC METABOLIC PANEL
Anion gap: 6 (ref 5–15)
BUN: 37 mg/dL — ABNORMAL HIGH (ref 8–23)
CO2: 21 mmol/L — ABNORMAL LOW (ref 22–32)
Calcium: 7.9 mg/dL — ABNORMAL LOW (ref 8.9–10.3)
Chloride: 110 mmol/L (ref 98–111)
Creatinine, Ser: 1.13 mg/dL (ref 0.61–1.24)
GFR, Estimated: >60 mL/min (ref >60)
Glucose, Bld: 116 mg/dL — ABNORMAL HIGH (ref 70–99)
Potassium: 4.4 mmol/L (ref 3.5–5.1)
Sodium: 137 mmol/L (ref 135–145)

## 2020-11-22 LAB — CBC
HCT: 19.5 % — ABNORMAL LOW (ref 39.0–52.0)
Hemoglobin: 6.7 g/dL — ABNORMAL LOW (ref 13.0–17.0)
MCH: 34 pg (ref 26.0–34.0)
MCHC: 34.4 g/dL (ref 30.0–36.0)
MCV: 99 fL (ref 80.0–100.0)
Platelets: 111 10*3/uL — ABNORMAL LOW (ref 150–400)
RBC: 1.97 MIL/uL — ABNORMAL LOW (ref 4.22–5.81)
RDW: 24.2 % — ABNORMAL HIGH (ref 11.5–15.5)
WBC: 8.1 10*3/uL (ref 4.0–10.5)
nRBC: 0 % (ref 0.0–0.2)

## 2020-11-22 LAB — GLUCOSE, CAPILLARY
Glucose-Capillary: 120 mg/dL — ABNORMAL HIGH (ref 70–99)
Glucose-Capillary: 146 mg/dL — ABNORMAL HIGH (ref 70–99)
Glucose-Capillary: 140 mg/dL — ABNORMAL HIGH (ref 70–99)

## 2020-11-22 LAB — FOLATE: Folate: 38 ng/mL (ref >5.9)

## 2020-11-22 LAB — PREPARE RBC (CROSSMATCH)

## 2020-11-22 LAB — VITAMIN B12: Vitamin B-12: 315 pg/mL (ref 180–914)

## 2020-11-22 MED ORDER — BOOST / RESOURCE BREEZE PO LIQD CUSTOM: 1.0000 | Freq: Three times a day (TID) | ORAL | Status: DC

## 2020-11-22 MED ORDER — SODIUM CHLORIDE 0.9 % IV SOLN: INTRAVENOUS | Status: DC

## 2020-11-22 MED ORDER — POLYETHYLENE GLYCOL 3350 17 G PO PACK
17.0000 g | PACK | Freq: Two times a day (BID) | ORAL | Status: DC
  Administered 2020-11-23 – 2020-11-25 (×4): 17 g via ORAL
  Filled 2020-11-22 (×6): qty 1

## 2020-11-22 MED ORDER — SODIUM CHLORIDE 0.9% IV SOLUTION: Freq: Once | INTRAVENOUS | Status: DC

## 2020-11-22 MED ORDER — ADULT MULTIVITAMIN W/MINERALS CH
1.0000 | ORAL_TABLET | Freq: Every day | ORAL | Status: DC
  Administered 2020-11-23 – 2020-11-25 (×3): 1 via ORAL
  Filled 2020-11-22 (×3): qty 1

## 2020-11-22 MED ORDER — HYDROCORTISONE NA SUCCINATE PF 100 MG IJ SOLR
50.0000 mg | Freq: Three times a day (TID) | INTRAMUSCULAR | Status: DC
  Administered 2020-11-22 – 2020-11-24 (×5): 50 mg via INTRAVENOUS
  Filled 2020-11-22 (×7): qty 1

## 2020-11-22 MED ORDER — ASCORBIC ACID 500 MG PO TABS
250.0000 mg | ORAL_TABLET | Freq: Two times a day (BID) | ORAL | Status: DC
  Administered 2020-11-22 – 2020-11-25 (×6): 250 mg via ORAL
  Filled 2020-11-22 (×6): qty 1

## 2020-11-22 NOTE — Plan of Care (Signed)
  Problem: Activity: Goal: Risk for activity intolerance will decrease Outcome: Progressing   Problem: Nutrition: Goal: Adequate nutrition will be maintained Outcome: Progressing   Problem: Coping: Goal: Level of anxiety will decrease Outcome: Progressing   Problem: Elimination: Goal: Will not experience complications related to bowel motility Outcome: Completed/Met Goal: Will not experience complications related to urinary retention Outcome: Completed/Met   Problem: Pain Managment: Goal: General experience of comfort will improve Outcome: Completed/Met   Problem: Safety: Goal: Ability to remain free from injury will improve Outcome: Completed/Met   Problem: Skin Integrity: Goal: Risk for impaired skin integrity will decrease Outcome: Completed/Met

## 2020-11-22 NOTE — Progress Notes (Signed)
[facility] NAME: James [name]    MR#:  [id number]  DATE OF BIRTH:  14-Mar-1951  SUBJECTIVE:  son at bedside. Tolerating clear liquid. Had a bowel movement. No bright red blood per rectum. Patient complains of chronic abdominal pain rates 6/10 today no vomiting.  REVIEW OF SYSTEMS:   Review of Systems  Constitutional:  Negative for chills, fever and weight loss.  HENT:  Negative for ear discharge, ear pain and nosebleeds.   Eyes:  Negative for blurred vision, pain and discharge.  Respiratory:  Negative for sputum production, shortness of breath, wheezing and stridor.   Cardiovascular:  Negative for chest pain, palpitations, orthopnea and PND.  Gastrointestinal:  Positive for abdominal pain. Negative for diarrhea, nausea and vomiting.  Genitourinary:  Negative for frequency and urgency.  Musculoskeletal:  Negative for back pain and joint pain.  Neurological:  Positive for weakness. Negative for sensory change, speech change and focal weakness.  Psychiatric/Behavioral:  Negative for depression and hallucinations. The patient is not nervous/anxious.   Tolerating Diet: Tolerating PT:   DRUG ALLERGIES:  No Known Allergies  VITALS:  Blood pressure (!) 149/78, pulse 77, temperature 99 F (37.2 C), temperature source Oral, resp. rate 16, height 5\' 8"  (1.727 m), weight 62.6 kg, SpO2 100 %.  PHYSICAL EXAMINATION:   Physical Exam  GENERAL:  70 y.o.-year-old patient lying in the bed with no acute distress.  LUNGS: Normal breath sounds bilaterally, no wheezing, rales, rhonchi. No use of accessory muscles of respiration.  CARDIOVASCULAR: S1, S2 normal. No murmurs, rubs, or gallops.  ABDOMEN: Soft, mild gen tender, nondistended. Bowel sounds present. No organomegaly or mass.  EXTREMITIES: No cyanosis, clubbing or edema b/l.    NEUROLOGIC: non-focal, weak PSYCHIATRIC:  patient is alert and oriented x 3.  SKIN: No obvious rash, lesion, or  ulcer.   LABORATORY PANEL:  CBC Recent Labs  Lab 11/22/20 0441  WBC 8.1  HGB 6.7*  HCT 19.5*  PLT 111*    Chemistries  Recent Labs  Lab 11/21/20 1400 11/22/20 0441  NA 136 137  K 3.3* 4.4  CL 105 110  CO2 22 21*  GLUCOSE 101* 116*  BUN 49* 37*  CREATININE 1.58* 1.13  CALCIUM 8.1* 7.9*  AST 12*  --   ALT 14  --   ALKPHOS 51  --   BILITOT 2.2*  --    Cardiac Enzymes No results for input(s): TROPONINI in the last 168 hours. RADIOLOGY:  CT ABDOMEN PELVIS W CONTRAST  Result Date: 11/21/2020 CLINICAL DATA:  Acute nonlocalized epigastric pain. Weakness and lethargy. Question perforation. Being treated for leukemia. EXAM: CT ABDOMEN AND PELVIS WITH CONTRAST TECHNIQUE: Multidetector CT imaging of the abdomen and pelvis was performed using the standard protocol following bolus administration of intravenous contrast. CONTRAST:  66mL OMNIPAQUE IOHEXOL 350 MG/ML SOLN COMPARISON:  01/24/2020 FINDINGS: Lower chest: Lung bases are clear.  No pleural or pericardial fluid. Hepatobiliary: Previous cholecystectomy. No significant liver parenchymal finding. Mild ductal prominence, within normal limits following cholecystectomy. Pancreas: Normal Spleen: Spleen upper limits of normal in size.  No focal lesion. Adrenals/Urinary Tract: Adrenal glands are normal. There are bilateral renal cysts. No evidence of mass or hydronephrosis. One or 2 punctate nonobstructing stones in the lower pole of the right kidney. Stomach/Bowel: Stomach is normal. Small bowel is normal. Moderate amount of gas and fecal matter in the colon without evidence of inflammatory disease or frank obstruction. Normal appearing appendix. Vascular/Lymphatic: Aortic atherosclerosis.  No aneurysm. IVC is normal. Retroperitoneal adenopathy is similar to the previous study, the largest nodal mass on the left at the level of the kidney measuring 4.2 x 2.8 cm. Other retroperitoneal nodes similarly stable. Reproductive: Enlarged prostate. Other:  No free fluid or air. Musculoskeletal: No fracture or focal bone lesion. Neurostimulator in place, entering at the L3-4 level and extending into the thoracic region. IMPRESSION: No evidence of acute bowel pathology. No evidence of perforation as questioned clinically. Moderate amount of gas and fecal matter in the colon, possibly due to opioid use. Spleen upper limits of normal in size. Retroperitoneal lymphadenopathy, similar to the study of September 2021. Enlarged prostate. Aortic Atherosclerosis (ICD10-I70.0). Electronically Signed   By: Nelson Chimes M.D.   On: 11/21/2020 15:26   DG Chest Port 1 View  Result Date: 11/21/2020 CLINICAL DATA:  Questionable sepsis. Weakness. Lethargy. Being treated for leukemia. EXAM: PORTABLE CHEST 1 VIEW COMPARISON:  09/12/2019 FINDINGS: The heart size and mediastinal contours are within normal limits. Ordinary age related aortic atherosclerotic calcification. Both lungs are clear. The visualized skeletal structures are unremarkable. IMPRESSION: No active disease. Electronically Signed   By: Nelson Chimes M.D.   On: 11/21/2020 15:20   ASSESSMENT AND PLAN:  70 year old male with a history of CLL on chemotherapy who presents to the ER via EMS for evaluation of weakness, lethargy, frequent falls, dizziness and poor oral intake. He also complains of pain in the epigastrium and left upper quadrant. Patient is noted to have a drop in his H&H from 8.2g/dl  on November 18, 2018 to 6.1g/dl on November 21, 2020. Stool was brown but mildly guaiac positive Patient was hypotensive upon arrival to the ER and received fluid boluses with improvement in his blood pressure.  Symptomatic anemia --Most likely from acute blood loss of unclear etiology rule out peptic ulcer disease, vs CKD-III vs CLL --Patient has had a 2 g drop in his H&H over the last couple of days and is symptomatic as evidenced by hypotension, dizziness, tachycardia and falls at home. -- Hold Plavix   -- G.I. consultation  with Dr. Vicente Males. Plans for EGD -- IV Protonix -- currently on clear liquid -- iron studies within normal limits -- came in with hemoglobin of 6.1--- one unit blood transfusion--- 6.7-- second unit blood transfusion.  CLL chronic -- patient getting chemotherapy through cancer center. Follows with Dr. Rogue Bussing  chronic abdominal pain etiology unclear -- per outpatient oncology notes? Adrenal insufficiency -- patient currently on Solu-Medrol per Dr.B and IV fluids -- patient goes to the pain clinic and is on Intrathecal pain pump  type II diabetes, no complication -- continue sliding scale insulin   Procedures: Family communication : son at bedside Consults : oncology, G.I. CODE STATUS: DNR DVT Prophylaxis : SCD for GI w/u Level of care: Med-Surg Status is: Inpatient  Remains inpatient appropriate because:Inpatient level of care appropriate due to severity of illness  Dispo: The patient is from: Home              Anticipated d/c is to: Home              Patient currently is not medically stable to d/c.   Difficult to place patient No        TOTAL TIME TAKING CARE OF THIS PATIENT: 30 minutes.  >50% time spent on counselling and coordination of care  Note: This dictation was prepared with Dragon dictation along with smaller phrase technology. Any transcriptional errors that result from this  process are unintentional.  [name] M.D    [facility]   CC: Primary care physician; [name], MD [name], male   DOB: [birth year], 70 y.o.   MRN: [id number]

## 2020-11-22 NOTE — Consult Note (Signed)
Jonathon Bellows , MD [address], [place], [place], [place], [address] [address], [place], [place], [place], [address] Phone: [phone]  Fax: [phone]  Consultation  Referring Provider:     Dr Francine Graven Primary Care Physician:  [name], MD Primary Gastroenterologist:[facility]  Reason for Consultation:     Abdominal pain and anemia  Date of Admission:  11/21/2020 Date of Consultation:  11/22/2020         HPI:   James [name] is a 70 y.o. male previously been seen by [facility] for abdominal pain and [facility] . Has had abdominal pain for many years  EGD 10/2017 with normal esophagus, PHG, erythematous mucosa in the antrum, normal duodenum. EGD 08/2016 with normal esophagus, acute chronic gastritis - path with vascular congestion, hemorrhage, and fibrosis which may be consistentw/ PHG. Colonoscopy at the same time showed diverticulosis and small polyps./   He was commenced on carafate, PPI, referred to hepatology . He has a diagnosis of CLL and follows with Dr. Rogue Bussing in the clinic.  Has undergone chemotherapy since 2006.  In February 2021 established with palliative care.  He is on Plavix.  And oxycodone.  He is on dexamethasone.  Also has CKD.He was admitted on 11/21/2020 for weakness and poor oral intake and lethargy.  Having brown stools.  Had pain in the epigastric area and left upper quadrant.  He was hypotensive in the ER short of breath and found to have a hemoglobin of 6.1 g compared to his baseline of around 8 g.  Moderate amount of gas and fecal matter were noted in the colon on CT scan of the abdomen.  He denies any overt blood loss having brown stools denies passing hard stools but his last bowel movement was over 2 days back.  Abdominal pain today is much better not in any pain or distress.  Past Medical History:  Diagnosis Date   CLL (chronic lymphocytic leukemia) (HCC)    Constipation    Depression    Diabetes mellitus without complication (HCC)    GERD  (gastroesophageal reflux disease)    Hematuria    Hyperlipidemia    Hypertension    Therapeutic opioid induced constipation     Past Surgical History:  Procedure Laterality Date   CATARACT EXTRACTION W/ INTRAOCULAR LENS IMPLANT Bilateral    CHOLECYSTECTOMY  1983   COLONOSCOPY WITH PROPOFOL N/A 10/27/2017   Procedure: COLONOSCOPY WITH PROPOFOL;  Surgeon: Manya Silvas, MD;  Location[facility] ENDOSCOPY;  Service: Endoscopy;  Laterality: N/A;   ESOPHAGOGASTRODUODENOSCOPY (EGD) WITH PROPOFOL N/A 11/20/2016   Procedure: ESOPHAGOGASTRODUODENOSCOPY (EGD) WITH PROPOFOL;  Surgeon: Manya Silvas, MD;  Location: [facility] ENDOSCOPY;  Service: Endoscopy;  Laterality: N/A;   ESOPHAGOGASTRODUODENOSCOPY (EGD) WITH PROPOFOL N/A 10/27/2017   Procedure: ESOPHAGOGASTRODUODENOSCOPY (EGD) WITH PROPOFOL;  Surgeon: Manya Silvas, MD;  Location: [facility] ENDOSCOPY;  Service: Endoscopy;  Laterality: N/A;   INTRATHECAL PUMP IMPLANT Left 02/07/2020   Procedure: INTRATHECAL PUMP & CATHETER IMPLANT;  Surgeon: Deetta Perla, MD;  Location: [facility] ORS;  Service: Neurosurgery;  Laterality: Left;   LOWER EXTREMITY ANGIOGRAPHY Right 05/19/2017   Procedure: LOWER EXTREMITY ANGIOGRAPHY;  Surgeon: Algernon Huxley, MD;  Location: [facility] CV LAB;  Service: Cardiovascular;  Laterality: Right;   LOWER EXTREMITY ANGIOGRAPHY Left 08/10/2018   Procedure: LOWER EXTREMITY ANGIOGRAPHY;  Surgeon: Algernon Huxley, MD;  Location: [facility] CV LAB;  Service: Cardiovascular;  Laterality: Left;    Prior to Admission medications   Medication Sig  Start Date End Date Taking? Authorizing Provider  clopidogrel (PLAVIX) 75 MG tablet Take 75 mg by mouth daily.  02/12/20  Yes [provider]  dexamethasone (DECADRON) 2 MG tablet Take 1 tablet (2 mg total) by mouth 2 (two) times daily with a meal. 11/17/20  Yes Cammie Sickle, MD  folic acid (FOLVITE) 1 MG tablet Take 1 tablet (1 mg total) by mouth daily. 11/09/19  Yes Cammie Sickle, MD  ibrutinib 420 MG TABS Take 420 mg by mouth daily. 08/31/20  Yes Cammie Sickle, MD  metFORMIN (GLUCOPHAGE) 1000 MG tablet Take 1 tablet (1,000 mg total) by mouth daily with breakfast. 10/04/20  Yes Burns, Wandra Feinstein, NP  naloxegol oxalate (MOVANTIK) 25 MG TABS tablet Take 1 tablet (25 mg total) by mouth daily. 09/28/20 12/27/20 Yes Milinda Pointer, MD  naloxone [facility]) 2 MG/2ML injection Inject 1 mL (1 mg total) into the muscle as needed for up to 2 doses (for opioid overdose). In case of emergency (overdose), inject into muscle of upper arm or leg and call 911. 03/02/20  Yes Milinda Pointer, MD  Oxycodone HCl 10 MG TABS Take 1 tablet (10 mg total) by mouth every 4 (four) hours as needed. Must last 30 days 11/08/20 12/08/20 Yes Milinda Pointer, MD  pantoprazole (PROTONIX) 40 MG tablet Take 40 mg by mouth at bedtime. 12/24/19  Yes [provider]  AMBULATORY NON FORMULARY MEDICATION Medication Name: Intrathecal pump Bupivacaine 20.0 mg/ml Fentanyl 2,000.0 mcg/ml Dose 1846.4 mcg/day    [provider]  Ensure (ENSURE) Take 237 mLs by mouth 3 (three) times daily between meals.     [provider]  gabapentin (NEURONTIN) 300 MG capsule Take 3 capsules (900 mg total) by mouth at bedtime AND 1 capsule (300 mg total) 2 (two) times daily. 07/31/20 10/29/20  Milinda Pointer, MD  methocarbamol (ROBAXIN) 500 MG tablet Take 1 tablet (500 mg total) by mouth every 6 (six) hours as needed for muscle spasms. Patient not taking: Reported on 11/21/2020 02/07/20   Lonell Face, NP  mirtazapine (REMERON) 30 MG tablet Take 1 tablet (30 mg total) by mouth at bedtime. Patient not taking: Reported on 11/21/2020 07/18/20   Cammie Sickle, MD  Oxycodone HCl 10 MG TABS Take 1 tablet (10 mg total) by mouth every 4 (four) hours as needed. Must last 30 days 09/09/20 10/20/20  Milinda Pointer, MD  Oxycodone HCl 10 MG TABS Take 1 tablet (10 mg total) by mouth every 4 (four) hours as  needed. Must last 30 days 10/09/20 11/08/20  Milinda Pointer, MD  Oxycodone HCl 10 MG TABS Take 1 tablet (10 mg total) by mouth every 4 (four) hours as needed. Must last 30 days 12/08/20 01/07/21  Milinda Pointer, MD    Family History  Problem Relation Age of Onset   Hypertension Sister      Social History   Tobacco Use   Smoking status: Every Day    Packs/day: 0.50    Years: 47.00    Pack years: 23.50    Types: Cigarettes   Smokeless tobacco: Never  Vaping Use   Vaping Use: Never used  Substance Use Topics   Alcohol use: Yes    Alcohol/week: 1.0 standard drink    Types: 1 Glasses of wine per week    Comment:  almost none in last 6 months   Drug use: No    Allergies as of 11/21/2020   (No Known Allergies)    Review of Systems:  All systems reviewed and negative except where noted in HPI.   Physical Exam:  Vital signs in last 24 hours: Temp:  [97.7 F (36.5 C)-99.1 F (37.3 C)] 97.7 F (36.5 C) (07/27 0551) Pulse Rate:  [71-103] 71 (07/27 0551) Resp:  [9-20] 20 (07/27 0551) BP: (101-151)/(60-81) 140/69 (07/27 0551) SpO2:  [99 %-100 %] 100 % (07/27 0551) Weight:  [62.6 kg] 62.6 kg (07/26 1342)   General:   Pleasant, cooperative in NAD Head:  Normocephalic and atraumatic. Eyes:   No icterus.   Conjunctiva pink. PERRLA. Ears:  Normal auditory acuity. Neck:  Supple; no masses or thyroidomegaly Lungs: Respirations even and unlabored. Lungs clear to auscultation bilaterally.   No wheezes, crackles, or rhonchi.  Heart:  Regular rate and rhythm;  Without murmur, clicks, rubs or gallops Abdomen:  Soft, nondistended, nontender. Normal bowel sounds. No appreciable masses or hepatomegaly.  No rebound or guarding.  Neurologic:  Alert and oriented x3;  grossly normal neurologically. Skin:  Intact without significant lesions or rashes. Cervical Nodes:  No significant cervical adenopathy. Psych:  Alert and cooperative. Normal affect.  LAB RESULTS: Recent Labs     11/21/20 1400 11/22/20 0035 11/22/20 0441  WBC 9.1  --  8.1  HGB 6.1* 7.0* 6.7*  HCT 18.6* 20.8* 19.5*  PLT 115*  --  111*   BMET Recent Labs    11/21/20 1400 11/22/20 0441  NA 136 137  K 3.3* 4.4  CL 105 110  CO2 22 21*  GLUCOSE 101* 116*  BUN 49* 37*  CREATININE 1.58* 1.13  CALCIUM 8.1* 7.9*   LFT Recent Labs    11/21/20 1400  PROT 5.5*  ALBUMIN 3.7  AST 12*  ALT 14  ALKPHOS 51  BILITOT 2.2*   PT/INR Recent Labs    11/21/20 1400  LABPROT 16.2*  INR 1.3*    STUDIES: CT ABDOMEN PELVIS W CONTRAST  Result Date: 11/21/2020 CLINICAL DATA:  Acute nonlocalized epigastric pain. Weakness and lethargy. Question perforation. Being treated for leukemia. EXAM: CT ABDOMEN AND PELVIS WITH CONTRAST TECHNIQUE: Multidetector CT imaging of the abdomen and pelvis was performed using the standard protocol following bolus administration of intravenous contrast. CONTRAST:  43mL OMNIPAQUE IOHEXOL 350 MG/ML SOLN COMPARISON:  01/24/2020 FINDINGS: Lower chest: Lung bases are clear.  No pleural or pericardial fluid. Hepatobiliary: Previous cholecystectomy. No significant liver parenchymal finding. Mild ductal prominence, within normal limits following cholecystectomy. Pancreas: Normal Spleen: Spleen upper limits of normal in size.  No focal lesion. Adrenals/Urinary Tract: Adrenal glands are normal. There are bilateral renal cysts. No evidence of mass or hydronephrosis. One or 2 punctate nonobstructing stones in the lower pole of the right kidney. Stomach/Bowel: Stomach is normal. Small bowel is normal. Moderate amount of gas and fecal matter in the colon without evidence of inflammatory disease or frank obstruction. Normal appearing appendix. Vascular/Lymphatic: Aortic atherosclerosis. No aneurysm. IVC is normal. Retroperitoneal adenopathy is similar to the previous study, the largest nodal mass on the left at the level of the kidney measuring 4.2 x 2.8 cm. Other retroperitoneal nodes similarly  stable. Reproductive: Enlarged prostate. Other: No free fluid or air. Musculoskeletal: No fracture or focal bone lesion. Neurostimulator in place, entering at the L3-4 level and extending into the thoracic region. IMPRESSION: No evidence of acute bowel pathology. No evidence of perforation as questioned clinically. Moderate amount of gas and fecal matter in the colon, possibly due to opioid use. Spleen upper limits of normal in size. Retroperitoneal lymphadenopathy, similar to the study of  September 2021. Enlarged prostate. Aortic Atherosclerosis (ICD10-I70.0). Electronically Signed   By: Nelson Chimes M.D.   On: 11/21/2020 15:26   DG Chest Port 1 View  Result Date: 11/21/2020 CLINICAL DATA:  Questionable sepsis. Weakness. Lethargy. Being treated for leukemia. EXAM: PORTABLE CHEST 1 VIEW COMPARISON:  09/12/2019 FINDINGS: The heart size and mediastinal contours are within normal limits. Ordinary age related aortic atherosclerotic calcification. Both lungs are clear. The visualized skeletal structures are unremarkable. IMPRESSION: No active disease. Electronically Signed   By: Nelson Chimes M.D.   On: 11/21/2020 15:20      Impression / Plan:   James [name] is a 70 y.o. y/o male with a history of portal hypertensive gastropathy and CLL diagnosed in July 30, 2004 status postchemotherapy follows with Dr. Rogue Bussing on opioids, Plavix and steroids presented to the emergency room with lethargy weakness found to have a hemoglobin lower than his baseline at 6.1 g.  Has been in the past the similar range between 6 and 8 g.  MCV macrocytic.  No overt blood loss.  Iron studies are normal.  Last EGD in Jul 30, 2017 showed portal hypertensive gastropathy.  He is on opiates.  CT scan of the abdomen showed features suggestive of constipation he does have CKD .  Presently no significant abdominal pain   Impression: Anemia likely multifactorial from a combination of CKD, CLL, blood loss from portal hypertensive gastropathy although he  does not have any iron deficiency.  He is macrocytic and B12 deficiency has not been looked into.  In terms of abdominal pain could be related to constipation   Plan 1.  Check B12 folate 2.  Monitor CBC and transfuse as needed. 3.  EGD tomorrow to evaluate chronic abdominal pain. 4.  Suggest aggressive treatment of opioid-induced constipation   I have discussed alternative options, risks & benefits,  which include, but are not limited to, bleeding, infection, perforation,respiratory complication & drug reaction.  The patient agrees with this plan & written consent will be obtained.     Thank you for involving me in the care of this patient.      LOS: 0 days   Jonathon Bellows, MD  11/22/2020, 7:40 AM

## 2020-11-22 NOTE — Consult Note (Signed)
[place] CONSULT NOTE  Patient Care Team: [name], MD as PCP - General (Internal Medicine) Cammie Sickle, MD as Consulting Physician (Hematology and Oncology) Borders, Kirt Boys, NP as Nurse Practitioner (Hospice and Palliative Medicine) Verlon Au, NP as Nurse Practitioner (Hematology and Oncology) Jacquelin Hawking, NP as Nurse Practitioner (Oncology) Lonell Face, NP as Nurse Practitioner (Neurosurgery) Milinda Pointer, MD as Consulting Physician (Pain Medicine)  CHIEF COMPLAINTS/PURPOSE OF CONSULTATION: CLL/severe anemia  HISTORY OF PRESENTING ILLNESS:  James [name] 70 y.o.  male with a complicated history of CLL currently on surveillance [poor tolerance to ibrutinib]; chronic anemia; chronic abdominal pain of unclear etiology s/p pain pump is currently admitted to hospital for generalized weakness/falls. Patient was also recently diagnosed with possible adrenal insufficiency-started on dexamethasone 2 mg twice daily.  However patient noted to have worsening generalized weakness and also poor appetite.  Also noted to have worsening abdominal pain which led to further evaluation in the emergency room.  Patient was also noted to have severe anemia with hemoglobin 6.1 with platelets about 115.  CT scan of the abdomen pelvis did not show any acute bowel pathology.  Also noted to have moderate fecal matter in the colon because of opioid use.  In the interim patient is s/p 1U PRBC transfusion.  However he feels significantly no better than his admission.  Continues to feel weak.  Currently accompanied by the son.  Of note patient denies any blood in stools or black-colored stools.   Review of Systems  Constitutional:  Positive for malaise/fatigue and weight loss. Negative for chills, diaphoresis and fever.  HENT:  Negative for nosebleeds and sore throat.   Eyes:  Negative for double vision.  Respiratory:  Positive for sputum production and  shortness of breath. Negative for cough, hemoptysis and wheezing.   Cardiovascular:  Negative for chest pain, palpitations, orthopnea and leg swelling.  Gastrointestinal:  Positive for abdominal pain and nausea. Negative for blood in stool, constipation, diarrhea, heartburn, melena and vomiting.  Genitourinary:  Negative for dysuria, frequency and urgency.  Musculoskeletal:  Negative for back pain and joint pain.  Skin: Negative.  Negative for itching and rash.  Neurological:  Positive for dizziness and weakness. Negative for tingling, focal weakness and headaches.  Endo/Heme/Allergies:  Does not bruise/bleed easily.  Psychiatric/Behavioral:  Negative for depression. The patient is not nervous/anxious and does not have insomnia.     MEDICAL HISTORY:  Past Medical History:  Diagnosis Date   CLL (chronic lymphocytic leukemia) ([place])    Constipation    Depression    Diabetes mellitus without complication (HCC)    GERD (gastroesophageal reflux disease)    Hematuria    Hyperlipidemia    Hypertension    Therapeutic opioid induced constipation     SURGICAL HISTORY: Past Surgical History:  Procedure Laterality Date   CATARACT EXTRACTION W/ INTRAOCULAR LENS IMPLANT Bilateral    CHOLECYSTECTOMY  1983   COLONOSCOPY WITH PROPOFOL N/A 10/27/2017   Procedure: COLONOSCOPY WITH PROPOFOL;  Surgeon: Manya Silvas, MD;  Location: [facility] ENDOSCOPY;  Service: Endoscopy;  Laterality: N/A;   ESOPHAGOGASTRODUODENOSCOPY (EGD) WITH PROPOFOL N/A 11/20/2016   Procedure: ESOPHAGOGASTRODUODENOSCOPY (EGD) WITH PROPOFOL;  Surgeon: Manya Silvas, MD;  Location[facility] ENDOSCOPY;  Service: Endoscopy;  Laterality: N/A;   ESOPHAGOGASTRODUODENOSCOPY (EGD) WITH PROPOFOL N/A 10/27/2017   Procedure: ESOPHAGOGASTRODUODENOSCOPY (EGD) WITH PROPOFOL;  Surgeon: Manya Silvas, MD;  Location: [facility] ENDOSCOPY;  Service: Endoscopy;  Laterality: N/A;   INTRATHECAL PUMP IMPLANT Left 02/07/2020  Procedure: INTRATHECAL PUMP &  CATHETER IMPLANT;  Surgeon: Deetta Perla, MD;  Location: [facility] ORS;  Service: Neurosurgery;  Laterality: Left;   LOWER EXTREMITY ANGIOGRAPHY Right 05/19/2017   Procedure: LOWER EXTREMITY ANGIOGRAPHY;  Surgeon: Algernon Huxley, MD;  Location: [place] CV LAB;  Service: Cardiovascular;  Laterality: Right;   LOWER EXTREMITY ANGIOGRAPHY Left 08/10/2018   Procedure: LOWER EXTREMITY ANGIOGRAPHY;  Surgeon: Algernon Huxley, MD;  Location: [place] CV LAB;  Service: Cardiovascular;  Laterality: Left;    SOCIAL HISTORY: Social History   Socioeconomic History   Marital status: Married    Spouse name: [name]   Number of children: 1   Years of education: Not on file   Highest education level: Not on file  Occupational History   Occupation: retired    Comment: truck driver  Tobacco Use   Smoking status: Every Day    Packs/day: 0.50    Years: 47.00    Pack years: 23.50    Types: Cigarettes   Smokeless tobacco: Never  Vaping Use   Vaping Use: Never used  Substance and Sexual Activity   Alcohol use: Yes    Alcohol/week: 1.0 standard drink    Types: 1 Glasses of wine per week    Comment:  almost none in last 6 months   Drug use: No   Sexual activity: Never    Partners: Female  Other Topics Concern   Not on file  Social History Narrative   Not on file   Social Determinants of Health   Financial Resource Strain: Not on file  Food Insecurity: Not on file  Transportation Needs: Not on file  Physical Activity: Not on file  Stress: Not on file  Social Connections: Not on file  Intimate Partner Violence: Not on file    FAMILY HISTORY: Family History  Problem Relation Age of Onset   Hypertension Sister     ALLERGIES:  has No Known Allergies.  MEDICATIONS:  Current Facility-Administered Medications  Medication Dose Route Frequency Provider Last Rate Last Admin   0.9 %  sodium chloride infusion (Manually program via Guardrails IV Fluids)   Intravenous Once [name], MD   Held  at 11/22/20 1320   0.9 %  sodium chloride infusion   Intravenous Continuous [name], MD 50 mL/hr at 11/22/20 1724 Rate Change at 11/22/20 1724   ascorbic acid (VITAMIN C) tablet 250 mg  250 mg Oral BID [name], MD   250 mg at 11/22/20 2151   feeding supplement (BOOST / RESOURCE BREEZE) liquid 1 Container  1 Container Oral TID BM [name], MD       folic acid (FOLVITE) tablet 1 mg  1 mg Oral Daily Agbata, Tochukwu, MD   1 mg at 11/22/20 0903   gabapentin (NEURONTIN) capsule 300 mg  300 mg Oral BID Agbata, Tochukwu, MD   300 mg at 11/22/20 2150   hydrocortisone sodium succinate (SOLU-CORTEF) 100 MG injection 50 mg  50 mg Intravenous Q8H Charlaine Dalton R, MD   50 mg at 11/22/20 1744   methocarbamol (ROBAXIN) tablet 500 mg  500 mg Oral Q6H PRN Agbata, Tochukwu, MD       mirtazapine (REMERON) tablet 30 mg  30 mg Oral QHS Agbata, Tochukwu, MD   30 mg at 11/22/20 2150   [START ON 11/23/2020] multivitamin with minerals tablet 1 tablet  1 tablet Oral Daily [name], MD       naloxegol oxalate (MOVANTIK) tablet 25 mg  25 mg Oral Daily  Collier Bullock, MD   25 mg at 11/22/20 0906   nicotine (NICODERM CQ - dosed in mg/24 hours) patch 14 mg  14 mg Transdermal Daily Agbata, Tochukwu, MD   14 mg at 11/22/20 0903   ondansetron (ZOFRAN) tablet 4 mg  4 mg Oral Q6H PRN Agbata, Tochukwu, MD       Or   ondansetron (ZOFRAN) injection 4 mg  4 mg Intravenous Q6H PRN Agbata, Tochukwu, MD       oxyCODONE (Oxy IR/ROXICODONE) immediate release tablet 10 mg  10 mg Oral Q4H PRN Agbata, Tochukwu, MD   10 mg at 11/22/20 2150   pantoprazole (PROTONIX) injection 40 mg  40 mg Intravenous Q12H Agbata, Tochukwu, MD   40 mg at 11/22/20 2152   polyethylene glycol (MIRALAX / GLYCOLAX) packet 17 g  17 g Oral BID Jonathon Bellows, MD          .  PHYSICAL EXAMINATION:  Vitals:   11/22/20 1554 11/22/20 1943  BP: (!) 149/78 136/73  Pulse: 77 75  Resp: 16 18  Temp: 99 F (37.2 C) 98.1 F (36.7 C)  SpO2: 100% 100%    Filed Weights   11/21/20 1342  Weight: 138 lb (62.6 kg)    Physical Exam Vitals and nursing note reviewed.  Constitutional:      Comments: Patient resting in the bed.  Accompanied by son [name].  Appears cachectic.  Pale.  HENT:     Head: Normocephalic and atraumatic.     Mouth/Throat:     Mouth: Mucous membranes are moist.     Pharynx: No oropharyngeal exudate.  Eyes:     Pupils: Pupils are equal, round, and reactive to light.  Cardiovascular:     Rate and Rhythm: Normal rate and regular rhythm.  Pulmonary:     Effort: No respiratory distress.     Breath sounds: No wheezing.     Comments: Decreased breath sounds bilaterally at bases.  No wheeze or crackles Abdominal:     General: Bowel sounds are normal. There is no distension.     Palpations: Abdomen is soft. There is no mass.     Tenderness: There is no abdominal tenderness. There is no guarding or rebound.  Musculoskeletal:        General: No tenderness. Normal range of motion.     Cervical back: Normal range of motion and neck supple.  Skin:    General: Skin is warm.  Neurological:     Mental Status: He is alert and oriented to person, place, and time.  Psychiatric:        Mood and Affect: Affect normal.        Judgment: Judgment normal.     LABORATORY DATA:  I have reviewed the data as listed Lab Results  Component Value Date   WBC 8.1 11/22/2020   HGB 9.6 (L) 11/22/2020   HCT 28.9 (L) 11/22/2020   MCV 99.0 11/22/2020   PLT 111 (L) 11/22/2020   Recent Labs    12/07/19 0812 01/04/20 0932 01/24/20 1156 02/01/20 0940 11/17/20 0824 11/17/20 2154 11/21/20 1400 11/22/20 0441  NA 139 139 138   < > 142 141 136 137  K 5.2* 4.9 6.2*   < > 5.3* 4.4 3.3* 4.4  CL 103 102 102   < > 104 115* 105 110  CO2 25 26 26    < > 23 20* 22 21*  GLUCOSE 166* 163* 160*   < > 126* 113* 101* 116*  BUN 27* 36*  37*   < > 39* 33* 49* 37*  CREATININE 1.12 1.16 1.47*   < > 1.60* 1.07 1.58* 1.13  CALCIUM 9.3 9.2 9.2   < > 9.6  7.3* 8.1* 7.9*  GFRNONAA >60 >60 48*   < > 46* >60 46* >60  GFRAA >60 >60 55*  --   --   --   --   --   PROT 7.0  --   --    < > 6.9 4.9* 5.5*  --   ALBUMIN 4.3  --   --    < > 4.5 3.2* 3.7  --   AST 12*  --   --    < > 23 20 12*  --   ALT 11  --   --    < > 26 15 14   --   ALKPHOS 63  --   --    < > 59 40 51  --   BILITOT 0.4  --   --    < > 1.4* 1.5* 2.2*  --    < > = values in this interval not displayed.    RADIOGRAPHIC STUDIES: I have personally reviewed the radiological images as listed and agreed with the findings in the report. CT ABDOMEN PELVIS W CONTRAST  Result Date: 11/21/2020 CLINICAL DATA:  Acute nonlocalized epigastric pain. Weakness and lethargy. Question perforation. Being treated for leukemia. EXAM: CT ABDOMEN AND PELVIS WITH CONTRAST TECHNIQUE: Multidetector CT imaging of the abdomen and pelvis was performed using the standard protocol following bolus administration of intravenous contrast. CONTRAST:  48m OMNIPAQUE IOHEXOL 350 MG/ML SOLN COMPARISON:  01/24/2020 FINDINGS: Lower chest: Lung bases are clear.  No pleural or pericardial fluid. Hepatobiliary: Previous cholecystectomy. No significant liver parenchymal finding. Mild ductal prominence, within normal limits following cholecystectomy. Pancreas: Normal Spleen: Spleen upper limits of normal in size.  No focal lesion. Adrenals/Urinary Tract: Adrenal glands are normal. There are bilateral renal cysts. No evidence of mass or hydronephrosis. One or 2 punctate nonobstructing stones in the lower pole of the right kidney. Stomach/Bowel: Stomach is normal. Small bowel is normal. Moderate amount of gas and fecal matter in the colon without evidence of inflammatory disease or frank obstruction. Normal appearing appendix. Vascular/Lymphatic: Aortic atherosclerosis. No aneurysm. IVC is normal. Retroperitoneal adenopathy is similar to the previous study, the largest nodal mass on the left at the level of the kidney measuring 4.2 x 2.8 cm.  Other retroperitoneal nodes similarly stable. Reproductive: Enlarged prostate. Other: No free fluid or air. Musculoskeletal: No fracture or focal bone lesion. Neurostimulator in place, entering at the L3-4 level and extending into the thoracic region. IMPRESSION: No evidence of acute bowel pathology. No evidence of perforation as questioned clinically. Moderate amount of gas and fecal matter in the colon, possibly due to opioid use. Spleen upper limits of normal in size. Retroperitoneal lymphadenopathy, similar to the study of September 2021. Enlarged prostate. Aortic Atherosclerosis (ICD10-I70.0). Electronically Signed   By: MNelson ChimesM.D.   On: 11/21/2020 15:26   DG Chest Port 1 View  Result Date: 11/21/2020 CLINICAL DATA:  Questionable sepsis. Weakness. Lethargy. Being treated for leukemia. EXAM: PORTABLE CHEST 1 VIEW COMPARISON:  09/12/2019 FINDINGS: The heart size and mediastinal contours are within normal limits. Ordinary age related aortic atherosclerotic calcification. Both lungs are clear. The visualized skeletal structures are unremarkable. IMPRESSION: No active disease. Electronically Signed   By: MNelson ChimesM.D.   On: 11/21/2020 15:20    Symptomatic anemia #70year old male patient  with a history of CLL/relapsed bone marrow/11 P deletion 7 q. Deletion-severe hemoglobin chronic abdominal pain of unclear etiology; question adrenal insufficiency is currently admitted to hospital for worsening generalized weakness/severe anemia with hemoglobin 6.5.   #Symptomatic severe anemia-multifactorial; suspect predominant CLL/SLL involving the bone marrow BX April 2022 bone marrow biopsy.  Patient has aggressive genotype 11 P/17 q. Deletion.  However given the acute drop in hemoglobin-agree with GI work-up including EGD.  Reviewed the recommendation of GI/awaiting EGD in the morning.  We will rule out hemolysis.   #Abdominal pain chronic-s/p evaluation with pain clinic s/p pain pump.  However suspect  adrenal insufficiency given symptomatic relief post steroids.  Recommendations:   #Recommend PRBC transfusion 1 unit.  #Given the concerns for adrenal insufficiency-in the acute illness-I would recommend stress dose of steroids 50 mg hydrocortisone every 8 hours.  #Long-term plan/prognosis: I had a long discussion with the patient's son [name] regarding the overall guarded prognosis given his severe anemia/chronic progressive abdominal pain without any obvious etiology/also given underlying aggressive CLL-unable to tolerate therapy.  However currently await EGD as planned for tomorrow.  Discussed further available options from oncology standpoint.  We will discussed with son again tomorrow post EGD.  Thank you Dr.Patel for allowing me to participate in the care of your pleasant patient. Please do not hesitate to contact me with questions or concerns in the interim.  Discussed with Dr. Posey Pronto.   All questions were answered. The patient knows to call the clinic with any problems, questions or concerns.    Cammie Sickle, MD 11/22/2020 11:08 PM

## 2020-11-22 NOTE — Assessment & Plan Note (Addendum)
#  70 year old male patient with a history of CLL/relapsed bone marrow/11 P deletion 7 q. Deletion-severe hemoglobin chronic abdominal pain of unclear etiology; question adrenal insufficiency is currently admitted to hospital for worsening generalized weakness/severe anemia with hemoglobin 6.5.   #Symptomatic severe anemia-multifactorial; suspect predominant CLL/SLL involving the bone marrow BX April 2022 bone marrow biopsy.  EGD shows multiple AV malformations stomach status post argon laser.  Agree with holding aspirin Plavix [see below].  Today hemoglobin is 7.9.  Transfuse 1 unit of PRBC.  #Abdominal pain chronic-s/p evaluation with pain clinic s/p pain pump; question adrenal insufficiency - currently on stress dose of steroids-with improvement of the pain.  Recommend switching over to hydrocortisone 30 mg in the a.m; 20 mg at night.  #History of peripheral vascular disease-s/p stenting [Dr. Dew]-would recommend holding aspirin Plavix given the acute GI bleed.  Will discuss with Dr. Lucky Cowboy regarding restarting/outpatient.  The above plan of care was discussed with patient.  Also discussed with Dr. Posey Pronto.  If patient's hemoglobin is clinically stable; patient will be discharged from oncology standpoint.  Please make follow-up appointment next week in the cancer center.

## 2020-11-22 NOTE — Progress Notes (Addendum)
Initial Nutrition Assessment  DOCUMENTATION CODES:   Severe malnutrition in context of chronic illness  INTERVENTION:   Boost Breeze po TID, each supplement provides 250 kcal and 9 grams of protein  MVI po daily   Vitamin C $RemoveB'250mg'[id number]$  po BID  D[employer] [place] with diet advancement   Pt at high refeed risk; recommend monitor potassium, magnesium and phosphorus labs daily until stable  NUTRITION DIAGNOSIS:   Severe Malnutrition related to cancer and cancer related treatments as evidenced by severe fat depletion, severe muscle depletion, 17 percent weight loss in 10 months.  GOAL:   Patient will meet greater than or equal to 90% of their needs  MONITOR:   PO intake, Supplement acceptance, Labs, Weight trends, Skin, I & O's  REASON FOR ASSESSMENT:   Consult Assessment of nutrition requirement/status  ASSESSMENT:   70 y/o male with h/o anemia, CLL on chemo, depression, DM, GERD, HLD, HTN, CKD III, chronic pain syndrome, chronic abdominal pain, diverticulosis and AVM who is admitted with abdominal pain  Met with pt in room today. Pt reports intermittent poor appetite and oral intake at baseline. Pt reports that there are times when he feels good and eats well and there are times when he has no appetite. Pt reports that his oral intake has been decreased for several months pta. Pt has a h/o chronic abdominal pain and reports poor oral intake at times when he feels bloated and pain. Pt is followed by the Dietitian at the cancer center. Pt reports that he drinks [employer] 1.4 at home that he orders offline. Pt reports that he does not tolerate Boost or Ensure but he does seem to tolerate the [employer]. Pt reports that he drinks vanilla at home but he would like to try the chocolate in hospital. Pt currently on clear liquids for EGD tomorrow. Pt reports that he ate some jello and some broth today. Pt reports that he is willing to drink peach Boost Breeze while on clear liquids; RD will change to  [employer] once his diet is advanced. Per chart, pt is down 28lbs(17%) over the past 10 months; this is significant weight loss; pt is aware. Pt with widespread ecchymosis; highly suspect scurvy, will add vitamin C supplementation. Of note, pt with h/o folate, B12 and vitamin D deficiency. Pt with macrocytic anemia, will plan to check vitamin labs.   Medications reviewed and include: folic acid, solu-cortef, remeron, nicotine, protonix, miralax  Labs reviewed: K 4.4 wnl, BUN 37(H) Hgb 6.7(L), Hct 19.5(L)  NUTRITION - FOCUSED PHYSICAL EXAM:  Flowsheet Row Most Recent Value  Orbital Region Mild depletion  Upper Arm Region Severe depletion  Thoracic and Lumbar Region Severe depletion  Buccal Region Mild depletion  Temple Region Moderate depletion  Clavicle Bone Region Severe depletion  Clavicle and Acromion Bone Region Severe depletion  Scapular Bone Region Severe depletion  Dorsal Hand Severe depletion  Patellar Region Severe depletion  Anterior Thigh Region Severe depletion  Posterior Calf Region Severe depletion  Edema (RD Assessment) Mild  Hair Reviewed  Eyes Reviewed  Mouth Reviewed  Skin Reviewed  Nails Reviewed   Diet Order:   Diet Order             Diet clear liquid Room service appropriate? Yes; Fluid consistency: Thin  Diet effective now                  EDUCATION NEEDS:   Education needs have been addressed  Skin:  Skin Assessment: Reviewed RN Assessment (  ecchymosis)  Last BM:  7/27- type 6  Height:   Ht Readings from Last 1 Encounters:  11/21/20 5' 8"  (1.727 m)    Weight:   Wt Readings from Last 1 Encounters:  11/21/20 62.6 kg    Ideal Body Weight:  70 kg  BMI:  Body mass index is 20.98 kg/m.  Estimated Nutritional Needs:   Kcal:  1800-2100kcal/day  Protein:  90-105g/day  Fluid:  1.7-1.9L/day  Koleen Distance MS, RD, LDN Please refer to [facility] for RD and/or RD on-call/weekend/after hours pager

## 2020-11-23 ENCOUNTER — Inpatient Hospital Stay: Payer: Medicare Other | Admitting: Anesthesiology

## 2020-11-23 ENCOUNTER — Encounter: Payer: Self-pay | Admitting: Internal Medicine

## 2020-11-23 ENCOUNTER — Encounter
Admission: EM | Disposition: A | Discharge status: Home Health | Payer: Self-pay | Source: Home / Self Care | Attending: Internal Medicine

## 2020-11-23 DIAGNOSIS — E273 Drug-induced adrenocortical insufficiency: Secondary | ICD-10-CM | POA: Diagnosis not present

## 2020-11-23 DIAGNOSIS — K31811 Angiodysplasia of stomach and duodenum with bleeding: Secondary | ICD-10-CM

## 2020-11-23 DIAGNOSIS — R5383 Other fatigue: Secondary | ICD-10-CM

## 2020-11-23 DIAGNOSIS — E43 Unspecified severe protein-calorie malnutrition: Secondary | ICD-10-CM | POA: Insufficient documentation

## 2020-11-23 DIAGNOSIS — K31819 Angiodysplasia of stomach and duodenum without bleeding: Secondary | ICD-10-CM

## 2020-11-23 DIAGNOSIS — D649 Anemia, unspecified: Secondary | ICD-10-CM | POA: Diagnosis not present

## 2020-11-23 DIAGNOSIS — C9110 Chronic lymphocytic leukemia of B-cell type not having achieved remission: Secondary | ICD-10-CM | POA: Diagnosis not present

## 2020-11-23 DIAGNOSIS — K219 Gastro-esophageal reflux disease without esophagitis: Secondary | ICD-10-CM

## 2020-11-23 DIAGNOSIS — D500 Iron deficiency anemia secondary to blood loss (chronic): Secondary | ICD-10-CM

## 2020-11-23 HISTORY — PX: ESOPHAGOGASTRODUODENOSCOPY (EGD) WITH PROPOFOL: SHX5813

## 2020-11-23 LAB — GLUCOSE, CAPILLARY
Glucose-Capillary: 142 mg/dL — ABNORMAL HIGH (ref 70–99)
Glucose-Capillary: 138 mg/dL — ABNORMAL HIGH (ref 70–99)
Glucose-Capillary: 107 mg/dL — ABNORMAL HIGH (ref 70–99)
Glucose-Capillary: 147 mg/dL — ABNORMAL HIGH (ref 70–99)

## 2020-11-23 LAB — HEMOGLOBIN AND HEMATOCRIT, BLOOD
HCT: 25.5 % — ABNORMAL LOW (ref 39.0–52.0)
Hemoglobin: 8.5 g/dL — ABNORMAL LOW (ref 13.0–17.0)

## 2020-11-23 LAB — LACTATE DEHYDROGENASE: LDH: 103 U/L (ref 98–192)

## 2020-11-23 SURGERY — ESOPHAGOGASTRODUODENOSCOPY (EGD) WITH PROPOFOL
Anesthesia: General

## 2020-11-23 MED ORDER — PROPOFOL 500 MG/50ML IV EMUL
INTRAVENOUS | Status: AC
  Filled 2020-11-23: qty 50

## 2020-11-23 MED ORDER — KATE FARMS STANDARD 1.4 PO LIQD
325.0000 mL | Freq: Two times a day (BID) | ORAL | Status: DC
  Administered 2020-11-24 (×2): 325 mL via ORAL
  Filled 2020-11-23: qty 325

## 2020-11-23 MED ORDER — PROPOFOL 500 MG/50ML IV EMUL
INTRAVENOUS | Status: DC | PRN
  Administered 2020-11-23: 145 ug/kg/min via INTRAVENOUS

## 2020-11-23 MED ORDER — PROPOFOL 10 MG/ML IV BOLUS
INTRAVENOUS | Status: DC | PRN
  Administered 2020-11-23: 50 mg via INTRAVENOUS

## 2020-11-23 NOTE — Anesthesia Preprocedure Evaluation (Signed)
Anesthesia Evaluation  Patient identified by MRN, date of birth, ID band Patient awake  General Assessment Comment:  Denies N/V  Reviewed: Allergy & Precautions, NPO status , Patient's Chart, lab work & pertinent test results  History of Anesthesia Complications Negative for: history of anesthetic complications  Airway Mallampati: II  TM Distance: >3 FB Neck ROM: Full    Dental  (+) Edentulous Upper, Edentulous Lower   Pulmonary neg pulmonary ROS, shortness of breath, neg sleep apnea, neg COPD, Current Smoker and Patient abstained from smoking.,    Pulmonary exam normal breath sounds clear to auscultation       Cardiovascular Exercise Tolerance: Poor METShypertension, + Peripheral Vascular Disease  (-) CAD and (-) Past MI negative cardio ROS  (-) dysrhythmias  Rhythm:Regular Rate:Normal - Systolic murmurs    Neuro/Psych PSYCHIATRIC DISORDERS Depression negative neurological ROS  negative psych ROS   GI/Hepatic GERD  ,(+)     (-) substance abuse  ,   Endo/Other  diabetes  Renal/GU CRFRenal diseasenegative Renal ROS     Musculoskeletal   Abdominal   Peds  Hematology   Anesthesia Other Findings Past Medical History: No date: CLL (chronic lymphocytic leukemia) (HCC) No date: Constipation No date: Depression No date: Diabetes mellitus without complication (HCC) No date: GERD (gastroesophageal reflux disease) No date: Hematuria No date: Hyperlipidemia No date: Hypertension No date: Therapeutic opioid induced constipation  Reproductive/Obstetrics                             Anesthesia Physical Anesthesia Plan  ASA: 3  Anesthesia Plan: General   Post-op Pain Management:    Induction: Intravenous  PONV Risk Score and Plan: 1 and Ondansetron, Propofol infusion and TIVA  Airway Management Planned: Nasal Cannula  Additional Equipment: None  Intra-op Plan:   Post-operative  Plan:   Informed Consent: I have reviewed the patients History and Physical, chart, labs and discussed the procedure including the risks, benefits and alternatives for the proposed anesthesia with the patient or authorized representative who has indicated his/her understanding and acceptance.     Dental advisory given  Plan Discussed with: CRNA and Surgeon  Anesthesia Plan Comments: (Discussed risks of anesthesia with patient, including possibility of difficulty with spontaneous ventilation under anesthesia necessitating airway intervention, PONV, and rare risks such as cardiac or respiratory or neurological events. Patient understands.)        Anesthesia Quick Evaluation

## 2020-11-23 NOTE — Progress Notes (Signed)
OT Cancellation Note  Patient Details Name: James [name] MRN: [id number] DOB: 14-Dec-1950   Cancelled Treatment:    Reason Eval/Treat Not Completed: Other (comment). Pt with nursing upon attempt. Will re-attempt OT evaluation next morning as medically appropriate.   Hanley Hays, MPH, MS, OTR/L ascom [phone] 11/23/20, 4:11 PM

## 2020-11-23 NOTE — Op Note (Signed)
[facility] Gastroenterology Patient Name: James [name] Procedure Date: 11/23/2020 10:16 AM MRN: [id number] Account #: [id number] Date of Birth: January 06, 1951 Admit Type: Outpatient Age: 70 Room: [facility] ENDO ROOM 3 Gender: Male Note Status: Finalized Procedure:             Upper GI endoscopy Indications:           Iron deficiency anemia secondary to chronic blood loss Providers:             Jonathon Bellows MD, MD Referring MD:          [name], MD (Referring MD) Medicines:             Monitored Anesthesia Care Complications:         No immediate complications. Procedure:             Pre-Anesthesia Assessment:                        - Prior to the procedure, a History and Physical was                         performed, and patient medications, allergies and                         sensitivities were reviewed. The patient's tolerance                         of previous anesthesia was reviewed.                        - The risks and benefits of the procedure and the                         sedation options and risks were discussed with the                         patient. All questions were answered and informed                         consent was obtained.                        - ASA Grade Assessment: III - A patient with severe                         systemic disease.                        After obtaining informed consent, the endoscope was                         passed under direct vision. Throughout the procedure,                         the patient's blood pressure, pulse, and oxygen                         saturations were monitored continuously. The Endoscope                         was  introduced through the mouth, and advanced to the                         third part of duodenum. The upper GI endoscopy was                         accomplished with ease. The patient tolerated the                         procedure well. Findings:      The esophagus was  normal.      Multiple 5 to 8 mm angioectasias with no bleeding were found on the       greater curvature of the stomach and at the incisura. Coagulation for       bleeding prevention using argon beam at 0.5 liters/minute and 20 watts       was successful.      A single large angioectasia without bleeding was found in the duodenal       bulb. Coagulation for bleeding prevention using argon beam at 0.5       liters/minute and 20 watts was successful.      The exam was otherwise without abnormality.      The cardia and gastric fundus were normal on retroflexion. Impression:            - Normal esophagus.                        - Multiple non-bleeding angioectasias in the stomach.                         Treated with argon beam coagulation.                        - A single non-bleeding angioectasia in the duodenum.                         Treated with argon beam coagulation.                        - The examination was otherwise normal.                        - No specimens collected. Recommendation:        - Return patient to hospital ward for ongoing care.                        - Advance diet as tolerated.                        - Use Prilosec (omeprazole) 40 mg PO daily for 6 weeks. Procedure Code(s):     --- Professional ---                        [phone], Esophagogastroduodenoscopy, flexible,                         transoral; with control of bleeding, any method Diagnosis Code(s):     --- Professional ---  K31.819, Angiodysplasia of stomach and duodenum                         without bleeding                        D50.0, Iron deficiency anemia secondary to blood loss                         (chronic) CPT copyright 2019 American Medical Association. All rights reserved. The codes documented in this report are preliminary and upon coder review may  be revised to meet current compliance requirements. Jonathon Bellows, MD Jonathon Bellows MD, MD 11/23/2020 10:39:20 AM This  report has been signed electronically. Number of Addenda: 0 Note Initiated On: 11/23/2020 10:16 AM Estimated Blood Loss:  Estimated blood loss: none.      [facility]

## 2020-11-23 NOTE — Progress Notes (Signed)
OT Cancellation Note  Patient Details Name: James [name] MRN: [id number] DOB: [birth year]   Cancelled Treatment:    Reason Eval/Treat Not Completed: Patient at procedure or test/ unavailable Pt off floor at EGD. Will re-attempt as able.   Philippa [facility] 11/23/2020, 10:10 am

## 2020-11-23 NOTE — Transfer of Care (Signed)
Immediate Anesthesia Transfer of Care Note  Patient: James [name]  Procedure(s) Performed: ESOPHAGOGASTRODUODENOSCOPY (EGD) WITH PROPOFOL  Patient Location: PACU  Anesthesia Type:General  Level of Consciousness: awake and alert   Airway & Oxygen Therapy: Patient Spontanous Breathing and Patient connected to nasal cannula oxygen  Post-op Assessment: Report given to RN and Post -op Vital signs reviewed and stable  Post vital signs: Reviewed and stable  Last Vitals:  Vitals Value Taken Time  BP 97/53 11/23/20 1040  Temp 35.9 C 11/23/20 1040  Pulse 52 11/23/20 1041  Resp 8 11/23/20 1041  SpO2 100 % 11/23/20 1041  Vitals shown include unvalidated device data.  Last Pain:  Vitals:   11/23/20 0937  TempSrc: Temporal  PainSc: 5       Patients Stated Pain Goal: 0 (94/07/68 0881)  Complications: No notable events documented.

## 2020-11-23 NOTE — Progress Notes (Addendum)
[facility] [place] NAME: James [name]    MR#:  [id number]  DATE OF BIRTH:  08/11/1950  SUBJECTIVE:  son at bedside. Tolerating PO soft diet today. Has chronic abdominal pain however looks much better. Patient underwent EGD this morning. He is hoping to go home today. REVIEW OF SYSTEMS:   Review of Systems  Constitutional:  Negative for chills, fever and weight loss.  HENT:  Negative for ear discharge, ear pain and nosebleeds.   Eyes:  Negative for blurred vision, pain and discharge.  Respiratory:  Negative for sputum production, shortness of breath, wheezing and stridor.   Cardiovascular:  Negative for chest pain, palpitations, orthopnea and PND.  Gastrointestinal:  Positive for abdominal pain. Negative for diarrhea, nausea and vomiting.  Genitourinary:  Negative for frequency and urgency.  Musculoskeletal:  Negative for back pain and joint pain.  Neurological:  Positive for weakness. Negative for sensory change, speech change and focal weakness.  Psychiatric/Behavioral:  Negative for depression and hallucinations. The patient is not nervous/anxious.   Tolerating Diet:yes Tolerating PT: not needed  DRUG ALLERGIES:  No Known Allergies  VITALS:  Blood pressure (!) 153/71, pulse (!) 59, temperature (!) 97.4 F (36.3 C), temperature source Oral, resp. rate (!) 9, height 5\' 8"  (1.727 m), weight 62.6 kg, SpO2 100 %.  PHYSICAL EXAMINATION:   Physical Exam  GENERAL:  70 y.o.-year-old patient lying in the bed with no acute distress. Pallor+ thin cachectic LUNGS: Normal breath sounds bilaterally, no wheezing, rales, rhonchi. No use of accessory muscles of respiration.  CARDIOVASCULAR: S1, S2 normal. No murmurs, rubs, or gallops.  ABDOMEN: Soft, mild gen tender, nondistended. Bowel sounds present. No organomegaly or mass.  EXTREMITIES: No cyanosis, clubbing or edema b/l.    NEUROLOGIC: non-focal, weak PSYCHIATRIC:  patient is alert and  oriented x 3.  SKIN: No obvious rash, lesion, or ulcer.   LABORATORY PANEL:  CBC Recent Labs  Lab 11/22/20 0441 11/22/20 1826 11/23/20 1205  WBC 8.1  --   --   HGB 6.7*   < > 8.5*  HCT 19.5*   < > 25.5*  PLT 111*  --   --    < > = values in this interval not displayed.     Chemistries  Recent Labs  Lab 11/21/20 1400 11/22/20 0441  NA 136 137  K 3.3* 4.4  CL 105 110  CO2 22 21*  GLUCOSE 101* 116*  BUN 49* 37*  CREATININE 1.58* 1.13  CALCIUM 8.1* 7.9*  AST 12*  --   ALT 14  --   ALKPHOS 51  --   BILITOT 2.2*  --     Cardiac Enzymes No results for input(s): TROPONINI in the last 168 hours. RADIOLOGY:  No results found. ASSESSMENT AND PLAN:  70 year old male with a history of CLL on chemotherapy who presents to the ER via EMS for evaluation of weakness, lethargy, frequent falls, dizziness and poor oral intake. He also complains of pain in the epigastrium and left upper quadrant. Patient is noted to have a drop in his H&H from 8.2g/dl  on November 18, 2018 to 6.1g/dl on November 21, 2020. Stool was brown but mildly guaiac positive Patient was hypotensive upon arrival to the ER and received fluid boluses with improvement in his blood pressure.  Symptomatic anemia --Most likely from acute blood loss of unclear etiology rule out peptic ulcer disease, vs CKD-III vs CLL --Patient has had a 2 g drop in his  H&H over the last couple of days and is symptomatic as evidenced by hypotension, dizziness, tachycardia and falls at home. -- Hold Plavix   -- G.I. consultation with Dr. Vicente Males. Plans for EGD -- IV Protonix -- currently on clear liquid -- iron studies within normal limits -- came in with hemoglobin of 6.1--- one unit blood transfusion--- 6.7-- second unit blood transfusion. --7/29-- EGD showed - Normal esophagus.                        - Multiple non-bleeding angioectasias in the stomach.                        Treated with argon beam coagulation.                        - A  single non-bleeding angioectasia in the duodenum.                        Treated with argon beam coagulation.                        - The examination was otherwise normal.  CLL chronic -- patient getting chemotherapy through cancer center. Follows with Dr. Rogue Bussing  chronic abdominal pain etiology unclear -- per outpatient oncology notes? Adrenal insufficiency -- patient currently on Solu-Medrol per Dr.B and IV fluids -- patient goes to the pain clinic and is on Intrathecal pain pump  type II diabetes, no complication -- continue sliding scale insulin  Nutrition Status: Nutrition Problem: Severe Malnutrition Etiology: cancer and cancer related treatments Signs/Symptoms: severe fat depletion, severe muscle depletion, percent weight loss Percent weight loss: 17 % follow dietitian recommendations    Procedures: EGD Family communication : son at bedside Consults : oncology, G.I. CODE STATUS: DNR DVT Prophylaxis : SCD for GI w/u Level of care: Med-Surg Status is: Inpatient  Remains inpatient appropriate because:Inpatient level of care appropriate due to severity of illness  Dispo: The patient is from: Home              Anticipated d/c is to: Home              Patient currently is not medically stable to d/c.   Difficult to place patient No  will monitor for one more day hopefully discharge tomorrow morning.      TOTAL TIME TAKING CARE OF THIS PATIENT: 30 minutes.  >50% time spent on counselling and coordination of care  Note: This dictation was prepared with Dragon dictation along with smaller phrase technology. Any transcriptional errors that result from this process are unintentional.  [name] M.D    [facility]   CC: Primary care physician; James Harrier, MD [name], male   DOB: 18-Nov-1950, 70 y.o.   MRN: [id number]

## 2020-11-23 NOTE — Anesthesia Postprocedure Evaluation (Signed)
Anesthesia Post Note  Patient: James [name]  Procedure(s) Performed: ESOPHAGOGASTRODUODENOSCOPY (EGD) WITH PROPOFOL  Patient location during evaluation: Endoscopy Anesthesia Type: General Level of consciousness: awake and alert Pain management: pain level controlled Vital Signs Assessment: post-procedure vital signs reviewed and stable Respiratory status: spontaneous breathing, nonlabored ventilation, respiratory function stable and patient connected to nasal cannula oxygen Cardiovascular status: blood pressure returned to baseline and stable Postop Assessment: no apparent nausea or vomiting Anesthetic complications: no   No notable events documented.   Last Vitals:  Vitals:   11/23/20 1110 11/23/20 1144  BP: 135/63 (!) 153/71  Pulse: (!) 59 (!) 59  Resp: (!) 9   Temp:  (!) 36.3 C  SpO2: 100% 100%    Last Pain:  Vitals:   11/23/20 1230  TempSrc:   PainSc: 7                  Arita Miss

## 2020-11-23 NOTE — TOC Initial Note (Signed)
Transition of Care [facility]) - Initial/Assessment Note    Patient Details  Name: James [name] MRN: [id number] Date of Birth: 18-Nov-1950  Transition of Care [facility]) CM/SW Contact:    Eileen Stanford, LCSW Phone Number: 11/23/2020, 2:41 PM  Clinical Narrative:  Pt is alert and oriented. Pt states he is agreeable to [facility] and has no agency preference. Pt states he lives alone. Pt states his son and friend take him to his doctor appointments. Pt sees his Cancer Doctor Dr. Blenda Peals Pt states he gets his meds from [facility] on [address]. PT states he wasn't getting any in home services prior to admission. CSW has provided the referral to [facility] and they will service.                 Expected Discharge Plan: [place] Barriers to Discharge: Continued Medical Work up   Patient Goals and CMS Choice Patient states their goals for this hospitalization and ongoing recovery are:: wants to return home   Choice offered to / list presented to : Patient  Expected Discharge Plan and Services Expected Discharge Plan: [place] In-house Referral: Clinical Social Work   Post Acute Care Choice: [place] arrangements for the past 2 months: Single Family Home                           HH Arranged: PT, OT [place] Agency: [place] [facility]) Date HH Agency Contacted: 11/23/20 Time HH Agency Contacted: 1440 Representative spoke with at [place]: Corene Cornea  Prior Living Arrangements/Services Living arrangements for the past 2 months: [place] with:: Self Patient language and need for interpreter reviewed:: Yes Do you feel safe going back to the place where you live?: Yes      Need for Family Participation in Patient Care: Yes (Comment) Care giver support system in place?: Yes (comment)   Criminal Activity/Legal Involvement Pertinent to Current Situation/Hospitalization: No - Comment as needed  Activities of Daily Living Home  Assistive Devices/Equipment: None ADL Screening (condition at time of admission) Patient's cognitive ability adequate to safely complete daily activities?: Yes Is the patient deaf or have difficulty hearing?: No Does the patient have difficulty seeing, even when wearing glasses/contacts?: No Does the patient have difficulty concentrating, remembering, or making decisions?: No Patient able to express need for assistance with ADLs?: Yes Does the patient have difficulty dressing or bathing?: Yes Independently performs ADLs?: Yes (appropriate for developmental age) Does the patient have difficulty walking or climbing stairs?: Yes Weakness of Legs: Both Weakness of Arms/Hands: Both  Permission Sought/Granted Permission sought to share information with : Family Supports    Share Information with NAME: nick     Permission granted to share info w Relationship: son     Emotional Assessment Appearance:: Appears stated age Attitude/Demeanor/Rapport: Engaged Affect (typically observed): Accepting Orientation: : Oriented to Self, Oriented to Place, Oriented to  Time, Oriented to Situation Alcohol / Substance Use: Not Applicable Psych Involvement: No (comment)  Admission diagnosis:  Symptomatic anemia [D64.9] Anemia, unspecified type [D64.9] Hypotension due to hypovolemia [I95.89, E86.1] Anemia [D64.9] Patient Active Problem List   Diagnosis Date Noted   Protein-calorie malnutrition, severe 11/23/2020   Symptomatic anemia 11/21/2020   Hypokalemia 11/21/2020   Therapeutic opioid-induced constipation (OIC) 09/28/2020   Non-traumatic compression fracture of T2 thoracic vertebra, sequela 04/17/2020   Non-traumatic compression fracture of T1 thoracic vertebra, sequela 04/17/2020  Non-traumatic compression fracture of T3 thoracic vertebra, sequela 04/17/2020   Chronic neuropathic pain 04/11/2020   Non compliance w medication regimen 03/09/2020   History of high risk medication treatment  03/02/2020   Medication care plan discussed with patient 03/02/2020   Pain medication agreement discussed 03/02/2020   Pain medication agreement signed 03/02/2020   Opiate use 03/02/2020   Physical tolerance to opiate drug 03/02/2020   Encounter for adjustment and management of infusion pump 02/29/2020   Presence of implanted infusion pump (Medtronic) 02/15/2020   Presence of intrathecal pump (Medtronic) 02/15/2020   DDD (degenerative disc disease), thoracic 07/12/2019   DDD (degenerative disc disease), lumbosacral 07/12/2019   Lumbar facet hypertrophy (Multilevel) 07/12/2019   Cancer-related pain 07/12/2019   Elevated sed rate 07/12/2019   Abdominal wall pain in epigastric region 06/15/2019   Thoracic radiculitis 06/15/2019   Right-sided low back pain without sciatica 06/15/2019   Bradycardia, unspecified 06/01/2019   Hypotension 06/01/2019   Chronic anticoagulation (Plavix) 05/19/2019   Vitamin D deficiency 05/19/2019   Chronic pain syndrome 05/03/2019   Pharmacologic therapy 05/03/2019   Disorder of skeletal system 05/03/2019   Problems influencing health status 05/03/2019   Chronic abdominal pain 05/03/2019   Elevated uric acid in blood 05/03/2019   Chronic prescription opiate use 05/03/2019   Enlarged prostate 30/12/2328   Folic acid deficiency 07/62/2633   Major depressive disorder, recurrent, mild ([place]) 03/08/2019   Shortness of breath 12/28/2018   Acute on chronic renal failure ([place]) 12/28/2018   SOB (shortness of breath) 12/28/2018   Portal hypertensive gastropathy ([place]) 09/25/2018   CKD stage 3 secondary to diabetes ([place]) 08/04/2018   Atherosclerosis of native arteries of the extremities with ulceration ([place]) 08/04/2018   Stage 3 chronic kidney disease ([place]) 08/04/2018   Anemia due to stage 3 chronic kidney disease ([place]) 04/15/2018   Gastroesophageal reflux disease without esophagitis 08/22/2017   Chronic epigastric pain (1ry area of Pain) 08/21/2017   Hx of  adenomatous colonic polyps 08/21/2017   Atherosclerosis of native arteries of extremity with rest pain ([place]) 05/13/2017   Hyperlipidemia 05/06/2017   Atherosclerotic peripheral vascular disease with intermittent claudication ([place]) 05/06/2017   Toe cyanosis 05/06/2017   AVM (arteriovenous malformation) of duodenum, acquired with hemorrhage 12/12/2016   Thrombocytopenia ([place]) 12/12/2016   Anemia 10/16/2016   Current smoker 10/16/2016   Malignant lymphoma, small lymphocytic ([place]) 03/25/2016   Generalized abdominal pain 12/15/2015   CLL (chronic lymphocytic leukemia) ([place]) 12/26/2013   Diabetes mellitus type 2, uncomplicated ([place]) 35/45/6256   Hypertension 12/26/2013   PCP:  [name], MD Pharmacy:   [facility] [address]), [place] [address] Phone: [phone] Fax: [place] [address] Phone: [phone] Fax: [phone]     Social Determinants of Health (SDOH) Interventions    Readmission Risk Interventions No flowsheet data found.

## 2020-11-23 NOTE — Progress Notes (Signed)
PT Cancellation Note  Patient Details Name: James [name] MRN: [id number] DOB: [birth year]   Cancelled Treatment:    Reason Eval/Treat Not Completed: Patient at procedure or test/unavailable. Patient off the floor this morning for test. PT will continue with attempts as appropriate.   Minna Merritts, PT, MPT  Percell Locus 11/23/2020, 12:01 PM

## 2020-11-23 NOTE — Evaluation (Signed)
Physical Therapy Evaluation Patient Details Name: James [name] MRN: [id number] DOB: 1950/05/16 Today's Date: 11/23/2020   History of Present Illness  Patient is a 70 year old male with a history of CLL on chemotherapy who presents to the ER via EMS for evaluation of weakness, lethargy, frequent falls, dizziness and poor oral intake. Symptomatic anemia most likely from acute blood loss of unclear etiology.  Clinical Impression  Patient agreeable to PT. Son at the bedside. Patient lives alone and at baseline and uses a rollator intermittently for ambulation. Patient does report 4-5 falls this year.  Patient currently is demonstrating high level of mobility overall today. He is Mod I for bed mobility, supervision for transfers. Patient ambulated a lap in the hallway with rolling walker and occasional cues for safety, no loss of balance. Patient ambulated 51ft without device in the room, no increased assistance required. Patient reports mild dizziness initially with standing that subsided with increased activity. Educated patient on fall prevention strategies, using rollator for longer distance ambulation, walking for strengthening/conditioning at home. Recommend HHPT at discharge. PT will continue to follow to maximize independence and decrease risk for falls.     Follow Up Recommendations Home health PT    Equipment Recommendations  None recommended by PT    Recommendations for Other Services       Precautions / Restrictions Precautions Precautions: Fall Restrictions Weight Bearing Restrictions: No      Mobility  Bed Mobility Overal bed mobility: Modified Independent Bed Mobility: Supine to Sit     Supine to sit: Modified independent (Device/Increase time);HOB elevated          Transfers Overall transfer level: Needs assistance Equipment used: Rolling walker (2 wheeled) Transfers: Sit to/from Stand Sit to Stand: Supervision         General transfer comment:  supervision for safety  Ambulation/Gait Ambulation/Gait assistance: Min guard;Supervision Gait Distance (Feet): 175 Feet Assistive device: Rolling walker (2 wheeled) (with and without rolling walker) Gait Pattern/deviations: Step-through pattern;Decreased stance time - left Gait velocity: decreased   General Gait Details: patient ambulated a lap in hallway with rolling walker with no loss of balance. educatd patient on proper use of rolling walker and he demonstrated understanding. patient ambualted a short distance in room (20 ft ) without rolling walker without increased assistance required. encouraged patient to use rollator at home for community distance for safety. mild dizziness is reported initially with standing that subsided with increased ambulation distance  Stairs            Wheelchair Mobility    Modified Rankin (Stroke Patients Only)       Balance   Sitting-balance support: Feet supported Sitting balance-Leahy Scale: Good     Standing balance support: Bilateral upper extremity supported;No upper extremity supported Standing balance-Leahy Scale: Fair                               Pertinent Vitals/Pain Pain Assessment: No/denies pain    Home Living Family/patient expects to be discharged to:: Private residence Living Arrangements: Alone Available Help at Discharge: Friend(s);Available PRN/intermittently;Neighbor Type of Home: House Home Access: Stairs to enter   [employer] of Steps: 2 Home Layout: One level Home Equipment: [place] - 4 wheels      Prior Function Level of Independence: Independent with assistive device(s)         Comments: intermittent use of rollator for ambulation. patient reports multiple falls this year (4-5). patient has  neighbors that check in on him as well as prepare meals     Hand Dominance        Extremity/Trunk Assessment   Upper Extremity Assessment Upper Extremity Assessment: Overall WFL  for tasks assessed    Lower Extremity Assessment Lower Extremity Assessment: Overall WFL for tasks assessed (5/5 strength BLE, hip add/abd, knee extension, dorsiflexion)       Communication   Communication: No difficulties  Cognition Arousal/Alertness: Awake/alert Behavior During Therapy: WFL for tasks assessed/performed Overall Cognitive Status: Within Functional Limits for tasks assessed                                        General Comments General comments (skin integrity, edema, etc.): educated patient on routine ambulation for strength/conditioning, fall prevention strategies, safety    Exercises     Assessment/Plan    PT Assessment Patient needs continued PT services  PT Problem List Decreased activity tolerance;Decreased balance;Decreased knowledge of use of DME;Decreased mobility       PT Treatment Interventions DME instruction;Gait training;Functional mobility training;Therapeutic activities;Therapeutic exercise;Balance training;Stair training    PT Goals (Current goals can be found in the Care Plan section)  Acute Rehab PT Goals Patient Stated Goal: to go home PT Goal Formulation: With patient/family Time For Goal Achievement: 12/07/20 Potential to Achieve Goals: Good    Frequency Min 2X/week   Barriers to discharge Decreased caregiver support      Co-evaluation               AM-PAC PT "6 Clicks" Mobility  Outcome Measure Help needed turning from your back to your side while in a flat bed without using bedrails?: None Help needed moving from lying on your back to sitting on the side of a flat bed without using bedrails?: None Help needed moving to and from a bed to a chair (including a wheelchair)?: A Little Help needed standing up from a chair using your arms (e.g., wheelchair or bedside chair)?: A Little Help needed to walk in hospital room?: A Little Help needed climbing 3-5 steps with a railing? : A Little 6 Click Score:  20    End of Session Equipment Utilized During Treatment: Gait belt Activity Tolerance: Patient tolerated treatment well Patient left: in bed;with family/visitor present (son in the room) Nurse Communication: Mobility status PT Visit Diagnosis: Unsteadiness on feet (R26.81)    Time: 9163-8466 PT Time Calculation (min) (ACUTE ONLY): 34 min   Charges:   PT Evaluation $PT Eval Moderate Complexity: 1 Mod PT Treatments $Therapeutic Activity: 8-22 mins        Minna Merritts, PT, MPT   Percell Locus 11/23/2020, 1:53 PM

## 2020-11-23 NOTE — H&P (Signed)
James Bellows, MD [address], [place], [place], [place], [address] [address], [place], [place], [place], [address] Phone: [phone]  Fax: [phone]  Primary Care Physician:  [name], MD   Pre-Procedure History & Physical: HPI:  James [name] is a 70 y.o. male is here for an endoscopy    Past Medical History:  Diagnosis Date   CLL (chronic lymphocytic leukemia) ([place])    Constipation    Depression    Diabetes mellitus without complication ([place])    GERD (gastroesophageal reflux disease)    Hematuria    Hyperlipidemia    Hypertension    Therapeutic opioid induced constipation     Past Surgical History:  Procedure Laterality Date   CATARACT EXTRACTION W/ INTRAOCULAR LENS IMPLANT Bilateral    CHOLECYSTECTOMY  1983   COLONOSCOPY WITH PROPOFOL N/A 10/27/2017   Procedure: COLONOSCOPY WITH PROPOFOL;  Surgeon: Manya Silvas, MD;  Location: [facility] ENDOSCOPY;  Service: Endoscopy;  Laterality: N/A;   ESOPHAGOGASTRODUODENOSCOPY (EGD) WITH PROPOFOL N/A 11/20/2016   Procedure: ESOPHAGOGASTRODUODENOSCOPY (EGD) WITH PROPOFOL;  Surgeon: Manya Silvas, MD;  Location: [facility] ENDOSCOPY;  Service: Endoscopy;  Laterality: N/A;   ESOPHAGOGASTRODUODENOSCOPY (EGD) WITH PROPOFOL N/A 10/27/2017   Procedure: ESOPHAGOGASTRODUODENOSCOPY (EGD) WITH PROPOFOL;  Surgeon: Manya Silvas, MD;  Location: [facility] ENDOSCOPY;  Service: Endoscopy;  Laterality: N/A;   INTRATHECAL PUMP IMPLANT Left 02/07/2020   Procedure: INTRATHECAL PUMP & CATHETER IMPLANT;  Surgeon: Deetta Perla, MD;  Location: [facility] ORS;  Service: Neurosurgery;  Laterality: Left;   LOWER EXTREMITY ANGIOGRAPHY Right 05/19/2017   Procedure: LOWER EXTREMITY ANGIOGRAPHY;  Surgeon: Algernon Huxley, MD;  Location: [place] CV LAB;  Service: Cardiovascular;  Laterality: Right;   LOWER EXTREMITY ANGIOGRAPHY Left 08/10/2018   Procedure: LOWER EXTREMITY ANGIOGRAPHY;  Surgeon: Algernon Huxley, MD;  Location: [place] CV LAB;   Service: Cardiovascular;  Laterality: Left;    Prior to Admission medications   Medication Sig Start Date End Date Taking? Authorizing Provider  clopidogrel (PLAVIX) 75 MG tablet Take 75 mg by mouth daily.  02/12/20  Yes [provider]  dexamethasone (DECADRON) 2 MG tablet Take 1 tablet (2 mg total) by mouth 2 (two) times daily with a meal. 11/17/20  Yes Cammie Sickle, MD  folic acid (FOLVITE) 1 MG tablet Take 1 tablet (1 mg total) by mouth daily. 11/09/19  Yes Cammie Sickle, MD  ibrutinib 420 MG TABS Take 420 mg by mouth daily. 08/31/20  Yes Cammie Sickle, MD  metFORMIN (GLUCOPHAGE) 1000 MG tablet Take 1 tablet (1,000 mg total) by mouth daily with breakfast. 10/04/20  Yes Burns, Wandra Feinstein, NP  naloxegol oxalate (MOVANTIK) 25 MG TABS tablet Take 1 tablet (25 mg total) by mouth daily. 09/28/20 12/27/20 Yes Milinda Pointer, MD  naloxone [facility]) 2 MG/2ML injection Inject 1 mL (1 mg total) into the muscle as needed for up to 2 doses (for opioid overdose). In case of emergency (overdose), inject into muscle of upper arm or leg and call 911. 03/02/20  Yes Milinda Pointer, MD  Oxycodone HCl 10 MG TABS Take 1 tablet (10 mg total) by mouth every 4 (four) hours as needed. Must last 30 days 11/08/20 12/08/20 Yes Milinda Pointer, MD  pantoprazole (PROTONIX) 40 MG tablet Take 40 mg by mouth at bedtime. 12/24/19  Yes [provider]  AMBULATORY NON FORMULARY MEDICATION Medication Name: Intrathecal pump Bupivacaine 20.0 mg/ml Fentanyl 2,000.0 mcg/ml Dose 1846.4 mcg/day    [provider]  Ensure (  ENSURE) Take 237 mLs by mouth 3 (three) times daily between meals.     [provider]  gabapentin (NEURONTIN) 300 MG capsule Take 3 capsules (900 mg total) by mouth at bedtime AND 1 capsule (300 mg total) 2 (two) times daily. 07/31/20 10/29/20  Milinda Pointer, MD  methocarbamol (ROBAXIN) 500 MG tablet Take 1 tablet (500 mg total) by mouth every 6 (six) hours  as needed for muscle spasms. Patient not taking: Reported on 11/21/2020 02/07/20   Lonell Face, NP  mirtazapine (REMERON) 30 MG tablet Take 1 tablet (30 mg total) by mouth at bedtime. Patient not taking: Reported on 11/21/2020 07/18/20   Cammie Sickle, MD  Oxycodone HCl 10 MG TABS Take 1 tablet (10 mg total) by mouth every 4 (four) hours as needed. Must last 30 days 09/09/20 10/20/20  Milinda Pointer, MD  Oxycodone HCl 10 MG TABS Take 1 tablet (10 mg total) by mouth every 4 (four) hours as needed. Must last 30 days 10/09/20 11/08/20  Milinda Pointer, MD  Oxycodone HCl 10 MG TABS Take 1 tablet (10 mg total) by mouth every 4 (four) hours as needed. Must last 30 days 12/08/20 01/07/21  Milinda Pointer, MD    Allergies as of 11/21/2020   (No Known Allergies)    Family History  Problem Relation Age of Onset   Hypertension Sister     Social History   Socioeconomic History   Marital status: Married    Spouse name: [name]   Number of children: 1   Years of education: Not on file   Highest education level: Not on file  Occupational History   Occupation: retired    Comment: truck driver  Tobacco Use   Smoking status: Every Day    Packs/day: 0.50    Years: 47.00    Pack years: 23.50    Types: Cigarettes   Smokeless tobacco: Never  Vaping Use   Vaping Use: Never used  Substance and Sexual Activity   Alcohol use: Yes    Alcohol/week: 1.0 standard drink    Types: 1 Glasses of wine per week    Comment:  almost none in last 6 months   Drug use: No   Sexual activity: Never    Partners: Female  Other Topics Concern   Not on file  Social History Narrative   Not on file   Social Determinants of Health   Financial Resource Strain: Not on file  Food Insecurity: Not on file  Transportation Needs: Not on file  Physical Activity: Not on file  Stress: Not on file  Social Connections: Not on file  Intimate Partner Violence: Not on file    Review of Systems: See HPI,  otherwise negative ROS  Physical Exam: BP (!) 149/81   Pulse 64   Temp (!) 96.6 F (35.9 C) (Temporal)   Resp 17   Ht 5\' 8"  (1.727 m)   Wt 62.6 kg   SpO2 100%   BMI 20.98 kg/m  General:   Alert,  pleasant and cooperative in NAD Head:  Normocephalic and atraumatic. Neck:  Supple; no masses or thyromegaly. Lungs:  Clear throughout to auscultation, normal respiratory effort.    Heart:  +S1, +S2, Regular rate and rhythm, No edema. Abdomen:  Soft, nontender and nondistended. Normal bowel sounds, without guarding, and without rebound.   Neurologic:  Alert and  oriented x4;  grossly normal neurologically.  Impression/Plan: RAAD CLAYSON is here for an endoscopy  to be performed for  evaluation of  anemia and abdominal pain    Risks, benefits, limitations, and alternatives regarding endoscopy have been reviewed with the patient.  Questions have been answered.  All parties agreeable.   James Bellows, MD  11/23/2020, 10:12 AM

## 2020-11-23 NOTE — Progress Notes (Signed)
James [name]   DOB:[birth year]   ER#:[id number]    Subjective: Patient continues to complain of fatigue.  Denies any nausea vomiting.  Continues to have chronic abdominal pain slightly improved.  Has been able to walk to the bathroom.  He gets short of breath on exertion.  Objective:  Vitals:   11/23/20 1604 11/23/20 1935  BP: 140/77 (!) 122/56  Pulse: 74 65  Resp: 18 12  Temp: 98.5 F (36.9 C) 98.1 F (36.7 C)  SpO2: 100% 100%     Intake/Output Summary (Last 24 hours) at 11/23/2020 2233 Last data filed at 11/23/2020 1700 Gross per 24 hour  Intake 780 ml  Output --  Net 780 ml    Physical Exam Vitals and nursing note reviewed.  Constitutional:      Comments: Patient resting in the bed.  Alone.   HENT:     Head: Normocephalic and atraumatic.     Mouth/Throat:     Mouth: Mucous membranes are moist.     Pharynx: No oropharyngeal exudate.  Eyes:     Pupils: Pupils are equal, round, and reactive to light.  Cardiovascular:     Rate and Rhythm: Normal rate and regular rhythm.  Pulmonary:     Effort: No respiratory distress.     Breath sounds: No wheezing.     Comments: Decreased breath sounds bilaterally at bases.  No wheeze or crackles Abdominal:     General: Bowel sounds are normal. There is no distension.     Palpations: Abdomen is soft. There is no mass.     Tenderness: There is no abdominal tenderness. There is no guarding or rebound.  Musculoskeletal:        General: No tenderness. Normal range of motion.     Cervical back: Normal range of motion and neck supple.  Skin:    General: Skin is warm.  Neurological:     Mental Status: He is alert and oriented to person, place, and time.  Psychiatric:        Mood and Affect: Affect normal.        Judgment: Judgment normal.     Labs:  Lab Results  Component Value Date   WBC 8.1 11/22/2020   HGB 8.5 (L) 11/23/2020   HCT 25.5 (L) 11/23/2020   MCV 99.0 11/22/2020   PLT 111 (L) 11/22/2020   NEUTROABS 3.8 11/21/2020     Lab Results  Component Value Date   NA 137 11/22/2020   K 4.4 11/22/2020   CL 110 11/22/2020   CO2 21 (L) 11/22/2020    Studies:  No results found.  Symptomatic anemia #70 year old male patient with a history of CLL/relapsed bone marrow/11 P deletion 7 q. Deletion-severe hemoglobin chronic abdominal pain of unclear etiology; question adrenal insufficiency is currently admitted to hospital for worsening generalized weakness/severe anemia with hemoglobin 6.5.   #Symptomatic severe anemia-multifactorial; suspect predominant CLL/SLL involving the bone marrow BX April 2022 bone marrow biopsy.  Awaiting EGD this morning.  #Abdominal pain chronic-s/p evaluation with pain clinic s/p pain pump; currently on stress dose of steroids-slight improvement noted.   Addendum: Reviewed the EGD findings-multiple angiodysplasias noted in the stomach status post cautery.  Agree with holding aspirin/Plavix given active bleeding.  I reviewed the above findings with the patient's son [name] at length.  Await stability of hemoglobin/prior to discharge.  Will evaluate in the morning.  Discussed with Dr. Posey Pronto.   Cammie Sickle, MD 11/23/2020  10:33 PM

## 2020-11-24 ENCOUNTER — Inpatient Hospital Stay: Payer: Medicare Other | Admitting: Internal Medicine

## 2020-11-24 ENCOUNTER — Inpatient Hospital Stay: Payer: Medicare Other

## 2020-11-24 ENCOUNTER — Encounter: Payer: Self-pay | Admitting: Gastroenterology

## 2020-11-24 DIAGNOSIS — I739 Peripheral vascular disease, unspecified: Secondary | ICD-10-CM

## 2020-11-24 DIAGNOSIS — D649 Anemia, unspecified: Secondary | ICD-10-CM | POA: Diagnosis not present

## 2020-11-24 DIAGNOSIS — E273 Drug-induced adrenocortical insufficiency: Secondary | ICD-10-CM | POA: Diagnosis not present

## 2020-11-24 DIAGNOSIS — R109 Unspecified abdominal pain: Secondary | ICD-10-CM | POA: Diagnosis not present

## 2020-11-24 DIAGNOSIS — C9110 Chronic lymphocytic leukemia of B-cell type not having achieved remission: Secondary | ICD-10-CM | POA: Diagnosis not present

## 2020-11-24 LAB — GLUCOSE, CAPILLARY
Glucose-Capillary: 138 mg/dL — ABNORMAL HIGH (ref 70–99)
Glucose-Capillary: 128 mg/dL — ABNORMAL HIGH (ref 70–99)
Glucose-Capillary: 134 mg/dL — ABNORMAL HIGH (ref 70–99)

## 2020-11-24 LAB — HEMOGLOBIN AND HEMATOCRIT, BLOOD
HCT: 23 % — ABNORMAL LOW (ref 39.0–52.0)
HCT: 25.2 % — ABNORMAL LOW (ref 39.0–52.0)
HCT: 25.4 % — ABNORMAL LOW (ref 39.0–52.0)
Hemoglobin: 7.9 g/dL — ABNORMAL LOW (ref 13.0–17.0)
Hemoglobin: 8.5 g/dL — ABNORMAL LOW (ref 13.0–17.0)
Hemoglobin: 8.7 g/dL — ABNORMAL LOW (ref 13.0–17.0)

## 2020-11-24 LAB — PREPARE RBC (CROSSMATCH)

## 2020-11-24 LAB — HAPTOGLOBIN: Haptoglobin: 93 mg/dL (ref 34–355)

## 2020-11-24 MED ORDER — HYDROCORTISONE 10 MG PO TABS
20.0000 mg | ORAL_TABLET | Freq: Every evening | ORAL | Status: DC
  Administered 2020-11-24: 20 mg via ORAL
  Filled 2020-11-24 (×2): qty 2

## 2020-11-24 MED ORDER — SODIUM CHLORIDE 0.9% IV SOLUTION: Freq: Once | INTRAVENOUS | Status: AC

## 2020-11-24 MED ORDER — HYDROCORTISONE 10 MG PO TABS
30.0000 mg | ORAL_TABLET | Freq: Every day | ORAL | Status: DC
  Administered 2020-11-24 – 2020-11-25 (×2): 30 mg via ORAL
  Filled 2020-11-24 (×2): qty 3

## 2020-11-24 MED ORDER — PANTOPRAZOLE SODIUM 40 MG PO TBEC
40.0000 mg | DELAYED_RELEASE_TABLET | Freq: Two times a day (BID) | ORAL | Status: DC
  Administered 2020-11-24 – 2020-11-25 (×3): 40 mg via ORAL
  Filled 2020-11-24 (×3): qty 1

## 2020-11-24 NOTE — Progress Notes (Signed)
OT Cancellation Note  Patient Details Name: James [name] MRN: [id number] DOB: Nov 15, 1950   Cancelled Treatment:    Reason Eval/Treat Not Completed: Patient declined, no reason specified. Consult received, chart reviewed. Upon attempt, pt endorsing feeling better and denies need for OT after educated in role of OT. Pt reports eager to discharge home later today. Declines toileting, grooming opportunities. Note, pt set up with [facility] services already. Will re-attempt OT evaluation next date if pt is still admitted.   Hanley Hays, MPH, MS, OTR/L ascom [phone] 11/24/20, 9:41 AM

## 2020-11-24 NOTE — Progress Notes (Signed)
James James [name] James [name] at [place] NAME: James James [name] James [name]    MR#:  [id number]  DATE OF BIRTH:  09/05/1950  SUBJECTIVE:  abdominal pain somewhat better. Tolerating soft diet.  REVIEW OF SYSTEMS:   Review of Systems  Constitutional:  Negative for chills, fever and weight loss.  HENT:  Negative for ear discharge, ear pain and nosebleeds.   Eyes:  Negative for blurred vision, pain and discharge.  Respiratory:  Negative for sputum production, shortness of breath, wheezing and stridor.   Cardiovascular:  Negative for chest pain, palpitations, orthopnea and PND.  Gastrointestinal:  Positive for abdominal pain. Negative for diarrhea, nausea and vomiting.  Genitourinary:  Negative for frequency and urgency.  Musculoskeletal:  Negative for back pain and joint pain.  Neurological:  Positive for weakness. Negative for sensory change, speech change and focal weakness.  Psychiatric/Behavioral:  Negative for depression and hallucinations. The patient is not nervous/anxious.   Tolerating Diet:yes Tolerating PT: not needed  DRUG ALLERGIES:  No Known Allergies  VITALS:  Blood pressure (!) 153/69, pulse 83, temperature 97.8 F (36.6 C), temperature source Oral, resp. rate 18, height 5\' 8"  (1.727 m), weight 62.6 kg, SpO2 100 %.  PHYSICAL EXAMINATION:   Physical Exam  GENERAL:  70 y.o.-year-old patient lying in the bed with no acute distress. Pallor+ thin cachectic LUNGS: Normal breath sounds bilaterally, no wheezing, rales, rhonchi. No use of accessory muscles of respiration.  CARDIOVASCULAR: S1, S2 normal. No murmurs, rubs, or gallops.  ABDOMEN: Soft, mild gen tender, nondistended. Bowel sounds present. No organomegaly or mass.  EXTREMITIES: No cyanosis, clubbing or edema [name]/l.    NEUROLOGIC: non-focal, weak PSYCHIATRIC:  patient is alert and oriented x 3.  SKIN: No obvious rash, lesion, or ulcer.   LABORATORY PANEL:  CBC Recent Labs  Lab 11/22/20 0441  11/22/20 1826 11/24/20 0025  WBC 8.1  --   --   HGB 6.7*   < > 7.9*  HCT 19.5*   < > 23.0*  PLT 111*  --   --    < > = values in this interval not displayed.     Chemistries  Recent Labs  Lab 11/21/20 1400 11/22/20 0441  NA 136 137  K 3.3* 4.4  CL 105 110  CO2 22 21*  GLUCOSE 101* 116*  BUN 49* 37*  CREATININE 1.58* 1.13  CALCIUM 8.1* 7.9*  AST 12*  --   ALT 14  --   ALKPHOS 51  --   BILITOT 2.2*  --     Cardiac Enzymes No results for input(s): TROPONINI in the last 168 hours. RADIOLOGY:  No results found. ASSESSMENT AND PLAN:  70 year old male with a history of CLL on chemotherapy who presents to the ER via EMS for evaluation of weakness, lethargy, frequent falls, dizziness and poor oral intake. He also complains of pain in the epigastrium and left upper quadrant. Patient is noted to have a drop in his H&H from 8.2g/dl  on November 18, 2018 to 6.1g/dl on November 21, 2020. Stool was brown but mildly guaiac positive Patient was hypotensive upon arrival to the ER and received fluid boluses with improvement in his blood pressure.  Symptomatic anemia --Most likely from acute blood loss of unclear etiology rule out peptic ulcer disease, vs CKD-III vs CLL --Patient has had a 2 g drop in his H&H over the last couple of days and is symptomatic as evidenced by hypotension, dizziness, tachycardia and falls at home. -- Hold Plavix   --  G.I. consultation with Dr. Vicente [name]. Plans for EGD -- IV Protonix -- currently on clear liquid -- iron studies within normal limits -- came in with hemoglobin of 6.1--- one unit blood transfusion--- 6.7-- second unit blood transfusion-- 8.5--- 7.9--- 30 unit blood transfusion per James James [name] James [name] request --7/29-- EGD showed - Normal esophagus.                        - Multiple non-bleeding angioectasias in the stomach.                        Treated with argon beam coagulation.                        - A single non-bleeding angioectasia in the duodenum.                         Treated with argon beam coagulation.                        - The examination was otherwise normal. --- Will hold aspirin/Plavix for now--- patient informed. James [name] agrees  CLL chronic -- patient getting chemotherapy through cancer center. Follows with Dr. Rogue James [name]  chronic abdominal pain etiology unclear -- per outpatient oncology notes? Adrenal insufficiency -- patient currently on Solu-Medrol per JamesB and IV fluids -- patient goes to the pain clinic and is on Intrathecal pain pump  type II diabetes, no complication -- continue sliding scale insulin  Nutrition Status: Nutrition Problem: Severe Malnutrition Etiology: cancer and cancer related treatments Signs/Symptoms: severe fat depletion, severe muscle depletion, percent weight loss Percent weight loss: 17 % follow dietitian recommendations    Procedures: EGD Family communication : son at bedside 7/28 Consults : oncology, G.I. CODE STATUS: DNR DVT Prophylaxis : SCD for GI w/u Level of care: Med-Surg Status is: Inpatient  Remains inpatient appropriate because:Inpatient level of care appropriate due to severity of illness  Dispo: The patient is from: Home              Anticipated d/c is to: Home              Patient currently is not medically stable to d/c.   Difficult to place patient No  patient will get blood transfusion today. Will check hemoglobin tomorrow if remains stable likely discharge in a.m.     TOTAL TIME TAKING CARE OF THIS PATIENT: 25 minutes.  >50% time spent on counselling and coordination of care  Note: This dictation was prepared with Dragon dictation along with smaller phrase technology. Any transcriptional errors that result from this process are unintentional.  James James [name] James [name] M.D    [facility]   CC: Primary care physician; James James [name] Harrier, MD Patient ID: James James [name] James [name], male   DOB: 1951-03-26, 70 y.o.   MRN: [id number]

## 2020-11-24 NOTE — Progress Notes (Signed)
PHARMACIST - PHYSICIAN COMMUNICATION  DR:   [name]  CONCERNING: IV to Oral Route Change Policy  RECOMMENDATION: This patient is receiving pantoprazole 40mg   by the intravenous route.  Based on criteria approved by the Pharmacy and Therapeutics Committee, the intravenous medication(s) is/are being converted to the equivalent oral dose form(s).   DESCRIPTION: These criteria include: The patient is eating (either orally or via tube) and/or has been taking other orally administered medications for a least 24 hours The patient has no evidence of active gastrointestinal bleeding or impaired GI absorption (gastrectomy, short bowel, patient on TNA or NPO).  If you have questions about this conversion, please contact the Pharmacy Department  []   [phone] )  [facility] [x]   [phone] )  [facility] []   [phone] )  [facility] []   [phone] )  [facility] []   [phone] )  [place] Rodriguez-Guzman PharmD, BCPS 11/24/2020 11:05 AM

## 2020-11-24 NOTE — Care Management Important Message (Signed)
Important Message  Patient Details  Name: [name] MRN: [id number] Date of Birth: 1951-03-29   Medicare Important Message Given:  N/A - LOS <3 / Initial given by admissions     Juliann Pulse A Taurean Ju 11/24/2020, 9:53 AM

## 2020-11-25 ENCOUNTER — Telehealth: Payer: Self-pay | Admitting: Internal Medicine

## 2020-11-25 DIAGNOSIS — C9110 Chronic lymphocytic leukemia of B-cell type not having achieved remission: Secondary | ICD-10-CM

## 2020-11-25 DIAGNOSIS — E273 Drug-induced adrenocortical insufficiency: Secondary | ICD-10-CM

## 2020-11-25 LAB — TYPE AND SCREEN
ABO/RH(D): A POS
Antibody Screen: NEGATIVE
Unit division: 0
Unit division: 0
Unit division: 0

## 2020-11-25 LAB — GLUCOSE, CAPILLARY
Glucose-Capillary: 124 mg/dL — ABNORMAL HIGH (ref 70–99)
Glucose-Capillary: 107 mg/dL — ABNORMAL HIGH (ref 70–99)

## 2020-11-25 LAB — BPAM RBC
Blood Product Expiration Date: 202207302359
Blood Product Expiration Date: 202208022359
Blood Product Expiration Date: 202208092359
ISSUE DATE / TIME: 202207261725
ISSUE DATE / TIME: 202207271240
ISSUE DATE / TIME: 202207291546
Unit Type and Rh: 6200
Unit Type and Rh: 9500
Unit Type and Rh: 9500

## 2020-11-25 LAB — HEMOGLOBIN: Hemoglobin: 8.7 g/dL — ABNORMAL LOW (ref 13.0–17.0)

## 2020-11-25 MED ORDER — HYDROCORTISONE 10 MG PO TABS: 30.0000 mg | ORAL_TABLET | Freq: Every day | ORAL | 0 refills | Status: AC

## 2020-11-25 MED ORDER — ADULT MULTIVITAMIN W/MINERALS CH: 1.0000 | ORAL_TABLET | Freq: Every day | ORAL | 0 refills | Status: AC

## 2020-11-25 MED ORDER — HYDROCORTISONE 20 MG PO TABS: 20.0000 mg | ORAL_TABLET | Freq: Every evening | ORAL | 0 refills | Status: AC

## 2020-11-25 NOTE — Telephone Encounter (Signed)
C- Please make follow-up appointment on AUG 4th, 2022- at 8:30-MD; labs- cbc/bmp;HOLD tube; possible 1 unit PRBC.  Thanks  GB

## 2020-11-25 NOTE — Progress Notes (Signed)
James [name]   DOB:10-19-1950   FM#:[id number]    Subjective: Patient s/p EGD on 7/28-noted to have AV malformations status post argon laser.  Denies any nausea vomiting.  Abdominal pain improved.  Not resolved.  Patient feels slightly stronger. Objective:  Vitals:   11/24/20 1836 11/24/20 1921  BP: 136/84 123/69  Pulse: 67 64  Resp: 19 12  Temp: 97.7 F (36.5 C) 98.2 F (36.8 C)  SpO2: 100% 100%     Intake/Output Summary (Last 24 hours) at 11/25/2020 0012 Last data filed at 11/24/2020 1836 Gross per 24 hour  Intake 342 ml  Output --  Net 342 ml   Physical Exam Vitals and nursing note reviewed.  Constitutional:      Comments: Patient resting in the bed.  Alone.   HENT:     Head: Normocephalic and atraumatic.     Mouth/Throat:     Mouth: Mucous membranes are moist.     Pharynx: No oropharyngeal exudate.  Eyes:     Pupils: Pupils are equal, round, and reactive to light.  Cardiovascular:     Rate and Rhythm: Normal rate and regular rhythm.  Pulmonary:     Effort: No respiratory distress.     Breath sounds: No wheezing.     Comments: Decreased breath sounds bilaterally at bases.  No wheeze or crackles Abdominal:     General: Bowel sounds are normal. There is no distension.     Palpations: Abdomen is soft. There is no mass.     Tenderness: There is no abdominal tenderness. There is no guarding or rebound.  Musculoskeletal:        General: No tenderness. Normal range of motion.     Cervical back: Normal range of motion and neck supple.  Skin:    General: Skin is warm.  Neurological:     Mental Status: He is alert and oriented to person, place, and time.  Psychiatric:        Mood and Affect: Affect normal.        Judgment: Judgment normal.     Labs:  Lab Results  Component Value Date   WBC 8.1 11/22/2020   HGB 8.7 (L) 11/24/2020   HCT 25.4 (L) 11/24/2020   MCV 99.0 11/22/2020   PLT 111 (L) 11/22/2020   NEUTROABS 3.8 11/21/2020    Lab Results  Component  Value Date   NA 137 11/22/2020   K 4.4 11/22/2020   CL 110 11/22/2020   CO2 21 (L) 11/22/2020    Studies:  No results found.  Symptomatic anemia #70 year old male patient with a history of CLL/relapsed bone marrow/11 P deletion 7 q. Deletion-severe hemoglobin chronic abdominal pain of unclear etiology; question adrenal insufficiency is currently admitted to hospital for worsening generalized weakness/severe anemia with hemoglobin 6.5.   #Symptomatic severe anemia-multifactorial; suspect predominant CLL/SLL involving the bone marrow BX April 2022 bone marrow biopsy.  EGD shows multiple AV malformations stomach status post argon laser.  Agree with holding aspirin Plavix [see below].  Today hemoglobin is 7.9.  Transfuse 1 unit of PRBC.  #Abdominal pain chronic-s/p evaluation with pain clinic s/p pain pump; question adrenal insufficiency - currently on stress dose of steroids-with improvement of the pain.  Recommend switching over to hydrocortisone 30 mg in the a.m; 20 mg at night.  #History of peripheral vascular disease-s/p stenting [Dr. Dew]-would recommend holding aspirin Plavix given the acute GI bleed.  Will discuss with Dr. Lucky Cowboy regarding restarting/outpatient.  The above plan of care was discussed  with patient.  Also discussed with Dr. Posey Pronto.  If patient's hemoglobin is clinically stable; patient will be discharged from oncology standpoint.  Please make follow-up appointment next week in the cancer center.   Cammie Sickle, MD

## 2020-11-25 NOTE — Progress Notes (Signed)
James [name] to be D/C'd Home per MD order.  Discussed prescriptions and follow up appointments with the patient. Prescriptions given to patient, medication list explained in detail. Pt verbalized understanding.  Allergies as of 11/25/2020   No Known Allergies      Medication List     STOP taking these medications    clopidogrel 75 MG tablet Commonly known as: PLAVIX   dexamethasone 2 MG tablet Commonly known as: DECADRON   methocarbamol 500 MG tablet Commonly known as: Robaxin   mirtazapine 30 MG tablet Commonly known as: Remeron       TAKE these medications    AMBULATORY NON FORMULARY MEDICATION Medication Name: Intrathecal pump Bupivacaine 20.0 mg/ml Fentanyl 2,000.0 mcg/ml Dose 1846.4 mcg/day   Ensure Take 237 mLs by mouth 3 (three) times daily between meals.   folic acid 1 MG tablet Commonly known as: FOLVITE Take 1 tablet (1 mg total) by mouth daily.   gabapentin 300 MG capsule Commonly known as: Neurontin Take 3 capsules (900 mg total) by mouth at bedtime AND 1 capsule (300 mg total) 2 (two) times daily.   hydrocortisone 10 MG tablet Commonly known as: CORTEF Take 3 tablets (30 mg total) by mouth daily.   hydrocortisone 20 MG tablet Commonly known as: CORTEF Take 1 tablet (20 mg total) by mouth every evening.   Imbruvica 420 MG Tabs Generic drug: ibrutinib Take 420 mg by mouth daily.   metFORMIN 1000 MG tablet Commonly known as: GLUCOPHAGE Take 1 tablet (1,000 mg total) by mouth daily with breakfast.   multivitamin with minerals Tabs tablet Take 1 tablet by mouth daily.   naloxegol oxalate 25 MG Tabs tablet Commonly known as: Movantik Take 1 tablet (25 mg total) by mouth daily.   naloxone 2 MG/2ML injection Commonly known as: NARCAN Inject 1 mL (1 mg total) into the muscle as needed for up to 2 doses (for opioid overdose). In case of emergency (overdose), inject into muscle of upper arm or leg and call 911.   Oxycodone HCl 10 MG  Tabs Take 1 tablet (10 mg total) by mouth every 4 (four) hours as needed. Must last 30 days What changed: Another medication with the same name was removed. Continue taking this medication, and follow the directions you see here.   Oxycodone HCl 10 MG Tabs Take 1 tablet (10 mg total) by mouth every 4 (four) hours as needed. Must last 30 days Start taking on: December 08, 2020 What changed: Another medication with the same name was removed. Continue taking this medication, and follow the directions you see here.   pantoprazole 40 MG tablet Commonly known as: PROTONIX Take 40 mg by mouth at bedtime.        Vitals:   11/25/20 0402 11/25/20 0753  BP: 118/68 128/78  Pulse: (!) 57 64  Resp: 12   Temp: 98.1 F (36.7 C) 98.7 F (37.1 C)  SpO2: 100% 100%    Skin clean, dry and intact without evidence of skin break down, no evidence of skin tears noted. IV catheter discontinued intact. Site without signs and symptoms of complications. Dressing and pressure applied. Pt denies pain at this time. No complaints noted.  An After Visit Summary was printed and given to the patient. Patient escorted via [place], and D/C home via private auto.  Melville C. Deatra Ina

## 2020-11-25 NOTE — Discharge Summary (Signed)
[place] [facility] [facility] [place] NAME: James [name]    MR#:  [id number]  DATE OF BIRTH:  10/23/1950  DATE OF ADMISSION:  11/21/2020 ADMITTING PHYSICIAN: [name], MD  DATE OF DISCHARGE: 11/25/20  PRIMARY CARE PHYSICIAN: [name], MD    ADMISSION DIAGNOSIS:  Symptomatic anemia [D64.9] Anemia, unspecified type [D64.9] Hypotension due to hypovolemia [I95.89, E86.1] Anemia [D64.9]  DISCHARGE DIAGNOSIS:  acute on chronic anemia suspected slow G.I. bleed likely due to AVM noted on EGD  SECONDARY DIAGNOSIS:   Past Medical History:  Diagnosis Date   CLL (chronic lymphocytic leukemia) ([facility])    Constipation    Depression    Diabetes mellitus without complication (HCC)    GERD (gastroesophageal reflux disease)    Hematuria    Hyperlipidemia    Hypertension    Therapeutic opioid induced constipation     HOSPITAL COURSE:   70 year old male with a history of CLL on chemotherapy who presents to the ER via EMS for evaluation of weakness, lethargy, frequent falls, dizziness and poor oral intake. He also complains of pain in the epigastrium and left upper quadrant. Patient is noted to have a drop in his H&H from 8.2g/dl  on November 18, 2018 to 6.1g/dl on November 21, 2020. Stool was brown but mildly guaiac positive Patient was hypotensive upon arrival to the ER and received fluid boluses with improvement in his blood pressure.   Symptomatic anemia --Most likely from acute blood loss of unclear etiology rule out peptic ulcer disease, vs CKD-III vs CLL --Patient has had a 2 g drop in his H&H over the last couple of days and is symptomatic as evidenced by hypotension, dizziness, tachycardia and falls at home. -- Hold Plavix until further recommendations  -- G.I. consultation with Dr. Vicente Males. Plans for EGD -- IV Protonix -- on clear liquid -- iron studies within normal limits -- came in with hemoglobin of 6.1--- one unit blood transfusion--- 6.7--  second unit blood transfusion-- 8.5--- 7.9--- 30 unit blood transfusion per Dr. Sharmaine Base request--8.7 --7/29-- EGD showed - Normal esophagus.                        - Multiple non-bleeding angioectasias in the stomach.                        Treated with argon beam coagulation.                        - A single non-bleeding angioectasia in the duodenum.                        Treated with argon beam coagulation.                        - The examination was otherwise normal. --- Will hold aspirin/Plavix for now--- patient informed. Dr. B agrees --7/30--hgb 8.7   CLL chronic -- patient getting chemotherapy through cancer center. Follows with Dr. Rogue Bussing   chronic abdominal pain etiology unclear -- per outpatient oncology notes? Adrenal insufficiency -- patient currently on Solu-Medrol per Dr.B and IV fluids--changed to po solucortef -- patient goes to the pain clinic and is on Intrathecal pain pump   type II diabetes, no complication -- continue sliding scale insulin   Nutrition Status: Nutrition Problem: Severe Malnutrition Etiology: cancer and cancer related treatments Signs/Symptoms: severe  fat depletion, severe muscle depletion, percent weight loss Percent weight loss: 17 % follow dietitian recommendations       Procedures: EGD Family communication : son nick on the phone--left msg Consults : oncology, G.I. CODE STATUS: DNR DVT Prophylaxis : SCD for GI w/u Level of care: Med-Surg Status is: Inpatient   Dispo: The patient is from: Home              Anticipated d/c is to: Home today              Patient currently is  medically stable to d/c.              Difficult to place patient No   patient overall medically optimized for discharge. CONSULTS OBTAINED:    DRUG ALLERGIES:  No Known Allergies  DISCHARGE MEDICATIONS:   Allergies as of 11/25/2020   No Known Allergies      Medication List     STOP taking these medications    clopidogrel 75 MG tablet Commonly  known as: PLAVIX   dexamethasone 2 MG tablet Commonly known as: DECADRON   methocarbamol 500 MG tablet Commonly known as: Robaxin   mirtazapine 30 MG tablet Commonly known as: Remeron       TAKE these medications    AMBULATORY NON FORMULARY MEDICATION Medication Name: Intrathecal pump Bupivacaine 20.0 mg/ml Fentanyl 2,000.0 mcg/ml Dose 1846.4 mcg/day   Ensure Take 237 mLs by mouth 3 (three) times daily between meals.   folic acid 1 MG tablet Commonly known as: FOLVITE Take 1 tablet (1 mg total) by mouth daily.   gabapentin 300 MG capsule Commonly known as: Neurontin Take 3 capsules (900 mg total) by mouth at bedtime AND 1 capsule (300 mg total) 2 (two) times daily.   hydrocortisone 10 MG tablet Commonly known as: CORTEF Take 3 tablets (30 mg total) by mouth daily.   hydrocortisone 20 MG tablet Commonly known as: CORTEF Take 1 tablet (20 mg total) by mouth every evening.   Imbruvica 420 MG Tabs Generic drug: ibrutinib Take 420 mg by mouth daily.   metFORMIN 1000 MG tablet Commonly known as: GLUCOPHAGE Take 1 tablet (1,000 mg total) by mouth daily with breakfast.   multivitamin with minerals Tabs tablet Take 1 tablet by mouth daily.   naloxegol oxalate 25 MG Tabs tablet Commonly known as: Movantik Take 1 tablet (25 mg total) by mouth daily.   naloxone 2 MG/2ML injection Commonly known as: NARCAN Inject 1 mL (1 mg total) into the muscle as needed for up to 2 doses (for opioid overdose). In case of emergency (overdose), inject into muscle of upper arm or leg and call 911.   Oxycodone HCl 10 MG Tabs Take 1 tablet (10 mg total) by mouth every 4 (four) hours as needed. Must last 30 days What changed: Another medication with the same name was removed. Continue taking this medication, and follow the directions you see here.   Oxycodone HCl 10 MG Tabs Take 1 tablet (10 mg total) by mouth every 4 (four) hours as needed. Must last 30 days Start taking on: December 08, 2020 What changed: Another medication with the same name was removed. Continue taking this medication, and follow the directions you see here.   pantoprazole 40 MG tablet Commonly known as: PROTONIX Take 40 mg by mouth at bedtime.        If you experience worsening of your admission symptoms, develop shortness of breath, life threatening emergency, suicidal or homicidal thoughts you  must seek medical attention immediately by calling 911 or calling your MD immediately  if symptoms less severe.  You Must read complete instructions/literature along with all the possible adverse reactions/side effects for all the Medicines you take and that have been prescribed to you. Take any new Medicines after you have completely understood and accept all the possible adverse reactions/side effects.   Please note  You were cared for by a hospitalist during your hospital stay. If you have any questions about your discharge medications or the care you received while you were in the hospital after you are discharged, you can call the unit and asked to speak with the hospitalist on call if the hospitalist that took care of you is not available. Once you are discharged, your primary care physician will handle any further medical issues. Please note that NO REFILLS for any discharge medications will be authorized once you are discharged, as it is imperative that you return to your primary care physician (or establish a relationship with a primary care physician if you do not have one) for your aftercare needs so that they can reassess your need for medications and monitor your lab values. Today   SUBJECTIVE   no new complaints.  VITAL SIGNS:  Blood pressure 128/78, pulse 64, temperature 98.7 F (37.1 C), temperature source Oral, resp. rate 12, height 5\' 8"  (1.727 m), weight 62.6 kg, SpO2 100 %.  I/O:   Intake/Output Summary (Last 24 hours) at 11/25/2020 0936 Last data filed at 11/25/2020 0323 Gross per 24  hour  Intake 428.83 ml  Output --  Net 428.83 ml    PHYSICAL EXAMINATION:  GENERAL:  70 y.o.-year-old patient lying in the bed with no acute distress. Pallor+ thin cachectic LUNGS: Normal breath sounds bilaterally, no wheezing, rales, rhonchi. No use of accessory muscles of respiration. CARDIOVASCULAR: S1, S2 normal. No murmurs, rubs, or gallops. ABDOMEN: Soft, mild gen tender, nondistended. Bowel sounds present. No organomegaly or mass. EXTREMITIES: No cyanosis, clubbing or edema b/l.    NEUROLOGIC: non-focal, weak PSYCHIATRIC:  patient is alert and oriented x 3. SKIN: No obvious rash, lesion, or ulcer. DATA REVIEW:   CBC  Recent Labs  Lab 11/22/20 0441 11/22/20 1826 11/24/20 2344 11/25/20 0423  WBC 8.1  --   --   --   HGB 6.7*   < > 8.7* 8.7*  HCT 19.5*   < > 25.4*  --   PLT 111*  --   --   --    < > = values in this interval not displayed.    Chemistries  Recent Labs  Lab 11/21/20 1400 11/22/20 0441  NA 136 137  K 3.3* 4.4  CL 105 110  CO2 22 21*  GLUCOSE 101* 116*  BUN 49* 37*  CREATININE 1.58* 1.13  CALCIUM 8.1* 7.9*  AST 12*  --   ALT 14  --   ALKPHOS 51  --   BILITOT 2.2*  --     Microbiology Results   Recent Results (from the past 240 hour(s))  Blood culture (routine single)     Status: None (Preliminary result)   Collection Time: 11/21/20  2:00 PM   Specimen: BLOOD  Result Value Ref Range Status   Specimen Description BLOOD BLOOD RIGHT FOREARM  Final   Special Requests   Final    BOTTLES DRAWN AEROBIC AND ANAEROBIC Blood Culture adequate volume   Culture   Final    NO GROWTH 3 DAYS Performed at [facility], [address] [place], [place] [address]    Report Status PENDING  Incomplete  Resp Panel by RT-PCR (Flu A&B, Covid) Nasopharyngeal Swab     Status: None   Collection Time: 11/21/20  2:00 PM   Specimen: Nasopharyngeal Swab; Nasopharyngeal(NP) swabs in vial transport medium  Result Value Ref Range Status   SARS Coronavirus 2  by RT PCR NEGATIVE NEGATIVE Final    Comment: (NOTE) SARS-CoV-2 target nucleic acids are NOT DETECTED.  The SARS-CoV-2 RNA is generally detectable in upper respiratory specimens during the acute phase of infection. The lowest concentration of SARS-CoV-2 viral copies this assay can detect is 138 copies/mL. A negative result does not preclude SARS-Cov-2 infection and should not be used as the sole basis for treatment or other patient management decisions. A negative result may occur with  improper specimen collection/handling, submission of specimen other than nasopharyngeal swab, presence of viral mutation(s) within the areas targeted by this assay, and inadequate number of viral copies(<138 copies/mL). A negative result must be combined with clinical observations, patient history, and epidemiological information. The expected result is Negative.  Fact Sheet for Patients:  EntrepreneurPulse.com.au  Fact Sheet for Healthcare Providers:  IncredibleEmployment.be  This test is no t yet approved or cleared by the [place] FDA and  has been authorized for detection and/or diagnosis of SARS-CoV-2 by FDA under an Emergency Use Authorization (EUA). This EUA will remain  in effect (meaning this test can be used) for the duration of the COVID-19 declaration under Section 564(b)(1) of the Act, 21 U.S.C.section 360bbb-3(b)(1), unless the authorization is terminated  or revoked sooner.       Influenza A by PCR NEGATIVE NEGATIVE Final   Influenza B by PCR NEGATIVE NEGATIVE Final    Comment: (NOTE) The Xpert Xpress SARS-CoV-2/FLU/RSV plus assay is intended as an aid in the diagnosis of influenza from Nasopharyngeal swab specimens and should not be used as a sole basis for treatment. Nasal washings and aspirates are unacceptable for Xpert Xpress SARS-CoV-2/FLU/RSV testing.  Fact Sheet for Patients: EntrepreneurPulse.com.au  Fact  Sheet for Healthcare Providers: IncredibleEmployment.be  This test is not yet approved or cleared by the [place] FDA and has been authorized for detection and/or diagnosis of SARS-CoV-2 by FDA under an Emergency Use Authorization (EUA). This EUA will remain in effect (meaning this test can be used) for the duration of the COVID-19 declaration under Section 564(b)(1) of the Act, 21 U.S.C. section 360bbb-3(b)(1), unless the authorization is terminated or revoked.  Performed at [facility], [address] [place], [place] [address]   Blood culture (single)     Status: None (Preliminary result)   Collection Time: 11/21/20  2:02 PM   Specimen: BLOOD  Result Value Ref Range Status   Specimen Description BLOOD BLOOD LEFT FOREARM  Final   Special Requests   Final    BOTTLES DRAWN AEROBIC AND ANAEROBIC Blood Culture results may not be optimal due to an excessive volume of blood received in culture bottles   Culture   Final    NO GROWTH 3 DAYS Performed at [facility], [address], [place], [place] [address]    Report Status PENDING  Incomplete    RADIOLOGY:  No results found.   CODE STATUS:     Code Status Orders  (From admission, onward)           Start     Ordered   11/21/20 1646  Do not attempt resuscitation (DNR)  Continuous  Question Answer Comment  In the event of cardiac or respiratory ARREST Do not call a "code blue"   In the event of cardiac or respiratory ARREST Do not perform Intubation, CPR, defibrillation or ACLS   In the event of cardiac or respiratory ARREST Use medication by any route, position, wound care, and other measures to relive pain and suffering. May use oxygen, suction and manual treatment of airway obstruction as needed for comfort.   Comments CODE STATUS was discussed with patient and he is a DNR      11/21/20 1647           Code Status History     Date Active Date Inactive Code Status  Order ID Comments User Context   12/28/2018 1824 12/30/2018 1909 DNR [id number]  Vaughan Basta, MD ED   08/10/2018 1126 08/10/2018 1625 Full Code [id number]  Algernon Huxley, MD Inpatient   05/19/2017 1435 05/19/2017 1903 Full Code [id number]  Algernon Huxley, MD Inpatient        TOTAL TIME TAKING CARE OF THIS PATIENT: 35 minutes.    [name] M.D  [facility]    CC: Primary care physician; [name], MD

## 2020-11-27 NOTE — Addendum Note (Signed)
Addended by: Delice Bison E on: 11/27/2020 08:28 AM   Modules accepted: Orders

## 2020-11-27 NOTE — Progress Notes (Signed)
PROVIDER NOTE: Information contained herein reflects review and annotations entered in association with encounter. Interpretation of such information and data should be left to medically-trained personnel. Information provided to patient can be located elsewhere in the medical record under "Patient Instructions". Document created using STT-dictation technology, any transcriptional errors that may result from process are unintentional.    Patient: James [name]  Service Category: Procedure  Provider: Gaspar Cola, MD  DOB: Oct 07, 1950  DOS: 11/28/2020  Location: [facility]  MRN: [id number]  Setting: Ambulatory - outpatient  Referring Provider: [name], MD  Type: Established Patient  Specialty: Interventional Pain Management  PCP: [name], MD   Primary Reason for Visit: Interventional Pain Management Treatment. CC: Abdominal Pain  Procedure:          Intrathecal Drug Delivery System (IDDS):  Type: Reservoir Refill [phone]) No rate change Region: Abdominal Laterality: Left  Type of Pump: Medtronic Synchromed II Delivery Route: Intrathecal Type of Pain Treated: Neuropathic/Nociceptive Primary Medication Class: Opioid/opiate  Medication, Concentration, Infusion Program, & Delivery Rate: Please see scanned programming printout.   Indications: 1. Chronic pain syndrome   2. Chronic epigastric pain (1ry area of Pain)   3. Chronic abdominal pain   4. Cancer-related pain   5. CLL (chronic lymphocytic leukemia) ([place])   6. Malignant lymphoma, small lymphocytic (HCC)   7. Abdominal wall pain in epigastric region   8. Non-traumatic compression fracture of T2 thoracic vertebra, sequela   9. Non-traumatic compression fracture of T1 thoracic vertebra, sequela   10. Presence of implanted infusion pump (Medtronic)   11. Presence of intrathecal pump (Medtronic)   12. Pharmacologic therapy   13. Chronic use of opiate for therapeutic purpose   14. Encounter for  medication management   15. Encounter for therapeutic procedure    Pain Assessment: Self-Reported Pain Score: 6 /10             Reported level is compatible with observation.        RTCB:  Nonopioids transferred on 06/27/2020: Gabapentin  Pharmacotherapy Assessment   Analgesic: Oxycodone IR 46m, 1 tab PO q 4 hrs (60 mg/day of oxycodone IR) (90 MME) Highest recorded MME/day: 150 mg/day 90 day average MME/day: 632.27 mg/day, according to PMP   Monitoring: [place] PMP: PDMP reviewed during this encounter.       Pharmacotherapy: No side-effects or adverse reactions reported. Compliance: No problems identified. Effectiveness: Clinically acceptable. Plan: Refer to "POC". UDS: No results found for: SUMMARY   Intrathecal Pump Therapy Assessment  Manufacturer: Medtronic Synchromed Type: Programmable Volume: 40 mL reservoir MRI compatibility: Yes   Drug content:  Primary Medication Class: Opioid Primary Medication: PF-Fentanyl (2000 mcg/mL)  Secondary Medication: PF-Bupivacaine (20 mg/mL)  Other Medication: None   PTM (PCA) mode:  PTM dose: 25 mcg bolus Duration of bolus: 1 minute Lockout interval: 4 hours Maximum 24-hour doses: 6   Programming:  Type: Simple continuous with PCA. See pump readout for details.   Changes:  Medication Change: None at this point Rate Change: No change in rate  Reported side-effects or adverse reactions: None reported  Effectiveness: Described as relatively effective, allowing for increase in activities of daily living (ADL) Clinically meaningful improvement in function (CMIF): Sustained CMIF goals met  Plan: Pump refill today  Pre-op H&P Assessment:  Mr. JCuevasis a 70y.o. (year old), male patient, seen today for interventional treatment. He  has a past surgical history that includes Cholecystectomy (1983); Esophagogastroduodenoscopy (egd) with propofol (N/A, 11/20/2016); Cataract extraction  w/ intraocular lens implant (Bilateral); Lower Extremity  Angiography (Right, 05/19/2017); Esophagogastroduodenoscopy (egd) with propofol (N/A, 10/27/2017); Colonoscopy with propofol (N/A, 10/27/2017); Lower Extremity Angiography (Left, 08/10/2018); Intrathecal pump implant (Left, 02/07/2020); and Esophagogastroduodenoscopy (egd) with propofol (N/A, 11/23/2020). James [name] has a current medication list which includes the following prescription(s): AMBULATORY NON FORMULARY MEDICATION, ensure, folic acid, hydrocortisone, hydrocortisone, ibrutinib, metformin, multivitamin with minerals, naloxegol oxalate, naloxone, [START ON 12/08/2020] oxycodone hcl, [START ON 01/07/2021] oxycodone hcl, pantoprazole, and gabapentin. His primarily concern today is the Abdominal Pain  Initial Vital Signs:  Pulse/HCG Rate: (!) 136  Temp: (!) 97.1 F (36.2 C) Resp:   BP: 130/75 SpO2: 100 %  BMI: Estimated body mass index is 19.92 kg/m as calculated from the following:   Height as of this encounter: 5' 8"  (1.727 m).   Weight as of this encounter: 131 lb (59.4 kg).  Risk Assessment: Allergies: Reviewed. He has No Known Allergies.  Allergy Precautions: None required Coagulopathies: Reviewed. None identified.  Blood-thinner therapy: None at this time Active Infection(s): Reviewed. None identified. James [name] is afebrile  Site Confirmation: James [name] was asked to confirm the procedure and laterality before marking the site Procedure checklist: Completed Consent: Before the procedure and under the influence of no sedative(s), amnesic(s), or anxiolytics, the patient was informed of the treatment options, risks and possible complications. To fulfill our ethical and legal obligations, as recommended by the American Medical Association's Code of Ethics, I have informed the patient of my clinical impression; the nature and purpose of the treatment or procedure; the risks, benefits, and possible complications of the intervention; the alternatives, including doing nothing; the risk(s) and  benefit(s) of the alternative treatment(s) or procedure(s); and the risk(s) and benefit(s) of doing nothing.  James [name] was provided with information about the general risks and possible complications associated with most interventional procedures. These include, but are not limited to: failure to achieve desired goals, infection, bleeding, organ or nerve damage, allergic reactions, paralysis, and/or death.  In addition, he was informed of those risks and possible complications associated to this particular procedure, which include, but are not limited to: damage to the implant; failure to decrease pain; local, systemic, or serious CNS infections, intraspinal abscess with possible cord compression and paralysis, or life-threatening such as meningitis; bleeding; organ damage; nerve injury or damage with subsequent sensory, motor, and/or autonomic system dysfunction, resulting in transient or permanent pain, numbness, and/or weakness of one or several areas of the body; allergic reactions, either minor or major life-threatening, such as anaphylactic or anaphylactoid reactions.  Furthermore, Mr. Burdette was informed of those risks and complications associated with the medications. These include, but are not limited to: allergic reactions (i.e.: anaphylactic or anaphylactoid reactions); endorphine suppression; bradycardia and/or hypotension; water retention and/or peripheral vascular relaxation leading to lower extremity edema and possible stasis ulcers; respiratory depression and/or shortness of breath; decreased metabolic rate leading to weight gain; swelling or edema; medication-induced neural toxicity; particulate matter embolism and blood vessel occlusion with resultant organ, and/or nervous system infarction; and/or intrathecal granuloma formation with possible spinal cord compression and permanent paralysis.  Before refilling the pump Mr. Agostinelli was informed that some of the medications used in the devise  may not be FDA approved for such use and therefore it constitutes an off-label use of the medications.  Finally, he was informed that Medicine is not an exact science; therefore, there is also the possibility of unforeseen or unpredictable risks and/or possible complications that may result in a catastrophic outcome.  The patient indicated having understood very clearly. We have given the patient no guarantees and we have made no promises. Enough time was given to the patient to ask questions, all of which were answered to the patient's satisfaction. Mr. Uher has indicated that he wanted to continue with the procedure. Attestation: I, the ordering provider, attest that I have discussed with the patient the benefits, risks, side-effects, alternatives, likelihood of achieving goals, and potential problems during recovery for the procedure that I have provided informed consent. Date  Time: 11/28/2020  1:32 PM  Pre-Procedure Preparation:  Monitoring: As per clinic protocol. Respiration, ETCO2, SpO2, BP, heart rate and rhythm monitor placed and checked for adequate function Safety Precautions: Patient was assessed for positional comfort and pressure points before starting the procedure. Time-out: I initiated and conducted the "Time-out" before starting the procedure, as per protocol. The patient was asked to participate by confirming the accuracy of the "Time Out" information. Verification of the correct person, site, and procedure were performed and confirmed by me, the nursing staff, and the patient. "Time-out" conducted as per Joint Commission's Universal Protocol (UP.01.01.01). Time: 1333  Description of Procedure:          Position: Supine Target Area: Central-port of intrathecal pump. Approach: Anterior, 90 degree angle approach. Area Prepped: Entire Area around the pump implant. DuraPrep (Iodine Povacrylex [0.7% available iodine] and Isopropyl Alcohol, 74% w/w) Safety Precautions: Aspiration  looking for blood return was conducted prior to all injections. At no point did we inject any substances, as a needle was being advanced. No attempts were made at seeking any paresthesias. Safe injection practices and needle disposal techniques used. Medications properly checked for expiration dates. SDV (single dose vial) medications used. Description of the Procedure: Protocol guidelines were followed. Two nurses trained to do implant refills were present during the entire procedure. The refill medication was checked by both healthcare providers as well as the patient. The patient was included in the "Time-out" to verify the medication. The patient was placed in position. The pump was identified. The area was prepped in the usual manner. The sterile template was positioned over the pump, making sure the side-port location matched that of the pump. Both, the pump and the template were held for stability. The needle provided in the Medtronic Kit was then introduced thru the center of the template and into the central port. The pump content was aspirated and discarded volume documented. The new medication was slowly infused into the pump, thru the filter, making sure to avoid overpressure of the device. The needle was then removed and the area cleansed, making sure to leave some of the prepping solution back to take advantage of its long term bactericidal properties. The pump was interrogated and programmed to reflect the correct medication, volume, and dosage. The program was printed and taken to the physician for approval. Once checked and signed by the physician, a copy was provided to the patient and another scanned into the EMR. Vitals:   11/28/20 1330  BP: 130/75  Pulse: (!) 136  Temp: (!) 97.1 F (36.2 C)  SpO2: 100%  Weight: 131 lb (59.4 kg)  Height: 5' 8"  (1.727 m)    Start Time: 1333 hrs. End Time: 1347 hrs. Materials & Medications: Medtronic Refill Kit Medication(s): Please see chart orders  for details.  Imaging Guidance:          Type of Imaging Technique: None used Indication(s): N/A Exposure Time: No patient exposure Contrast: None used. Fluoroscopic Guidance: N/A  Ultrasound Guidance: N/A Interpretation: N/A  Antibiotic Prophylaxis:   Anti-infectives (From admission, onward)    None      Indication(s): None identified  Post-operative Assessment:  Post-procedure Vital Signs:  Pulse/HCG Rate: (!) 136  Temp: (!) 97.1 F (36.2 C) Resp:   BP: 130/75 SpO2: 100 %  EBL: None  Complications: No immediate post-treatment complications observed by team, or reported by patient.  Note: The patient tolerated the entire procedure well. A repeat set of vitals were taken after the procedure and the patient was kept under observation following institutional policy, for this type of procedure. Post-procedural neurological assessment was performed, showing return to baseline, prior to discharge. The patient was provided with post-procedure discharge instructions, including a section on how to identify potential problems. Should any problems arise concerning this procedure, the patient was given instructions to immediately contact us, at any time, without hesitation. In any case, we plan to contact the patient by telephone for a follow-up status report regarding this interventional procedure.  Comments:  No additional relevant information.  Plan of Care  Orders:  Orders Placed This Encounter  Procedures   PUMP REFILL    Maintain Protocol by having two(2) healthcare providers during procedure and programming.    Scheduling Instructions:     Please refill intrathecal pump today.    Order Specific Question:   Where will this procedure be performed?    Answer:   [facility]   PUMP REFILL    Whenever possible schedule on a procedure today.    Standing Status:   Future    Standing Expiration Date:   04/27/2021    Scheduling Instructions:     Please schedule  intrathecal pump refill based on pump programming. Avoid schedule intervals of more than 120 days (4 months).    Order Specific Question:   Where will this procedure be performed?    Answer:   [facility]   Informed Consent Details: Physician/Practitioner Attestation; Transcribe to consent form and obtain patient signature    Transcribe to consent form and obtain patient signature.    Order Specific Question:   Physician/Practitioner attestation of informed consent for procedure/surgical case    Answer:   I, the physician/practitioner, attest that I have discussed with the patient the benefits, risks, side effects, alternatives, likelihood of achieving goals and potential problems during recovery for the procedure that I have provided informed consent.    Order Specific Question:   Procedure    Answer:   Intrathecal pump refill    Order Specific Question:   Physician/Practitioner performing the procedure    Answer:   Attending Physician: Kathlen Brunswick. Dossie Arbour, MD & designated trained staff    Order Specific Question:   Indication/Reason    Answer:   Chronic Pain Syndrome (G89.4), presence of an intrathecal pump (Z97.8)    Chronic Opioid Analgesic:  Oxycodone IR 54m, 1 tab PO q 4 hrs (60 mg/day of oxycodone IR) (90 MME) Highest recorded MME/day: 150 mg/day 90 day average MME/day: 632.27 mg/day, according to PMP   Medications ordered for procedure: Meds ordered this encounter  Medications   Oxycodone HCl 10 MG TABS    Sig: Take 1 tablet (10 mg total) by mouth every 4 (four) hours as needed. Must last 30 days    Dispense:  180 tablet    Refill:  0    Not a duplicate. Do NOT delete! Dispense 1 day early if closed on fill date. Warn not to take CNS-depressants 8  hours before or after taking opioid. Do not send refill request. Renewal requires appointment.    Medications administered: Dwyne F. Mara had no medications administered during this visit.  See the medical record for  exact dosing, route, and time of administration.  Follow-up plan:   Return for Pump Refill (Max:70mo.      Interventional Therapies  Risk  Complexity Considerations:   NOTE: PLAVIX ANTICOAGULATION  (Stop: 7 days  Restart: 2 hrs)   Planned  Pending:   Decrease pump PCA activations from 6/day to 3/day. Therapeutic bilateral splanchnic nerves neurolysis #1 with alcohol  Diagnostic intrathecal catheter test under fluoroscopic guidance to determine location    Under consideration:   Possible revision of intrathecal catheter    Completed:   Diagnostic bilateral celiac plexus block x2 (06/01/2019; 11/02/2019) (50/30/30/0) (75/75/0/0)  Therapeutic bilateral celiac plexus neurolysis with phenol x1 (04/06/2020)  Diagnostic bilateral rectus abdominis trigger point injection x1 (04/06/2020) (0/0/0)  Diagnostic (ML) T12-L1 intrathecal injection of PF-fentanyl 25 mcg as an intrathecal pump trial x1 (12/14/2019)   Therapeutic  Palliative (PRN) options:   Intrathecal pump management     Recent Visits Date Type Provider Dept  10/24/20 Procedure visit NMilinda Pointer MD [facility]  09/28/20 Procedure visit NMilinda Pointer MD [facility]  Showing recent visits within past 90 days and meeting all other requirements Today's Visits Date Type Provider Dept  11/28/20 Procedure visit NMilinda Pointer MD [facility]  Showing today's visits and meeting all other requirements Future Appointments Date Type Provider Dept  12/26/20 Appointment NMilinda Pointer MD [facility]  Showing future appointments within next 90 days and meeting all other requirements Disposition: Discharge home  Discharge (Date  Time): 11/28/2020;   hrs.   Primary Care Physician: [facility]Tracie Harrier [facility] Note by: FGaspar Cola MD Date: 11/28/2020; Time: 2:12 PM  Disclaimer:  Medicine is not an eChief Strategy Officer The only  guarantee in medicine is that nothing is guaranteed. It is important to note that the decision to proceed with this intervention was based on the information collected from the patient. The Data and conclusions were drawn from the patient's questionnaire, the interview, and the physical examination. Because the information was provided in large part by the patient, it cannot be guaranteed that it has not been purposely or unconsciously manipulated. Every effort has been made to obtain as much relevant data as possible for this evaluation. It is important to note that the conclusions that lead to this procedure are derived in large part from the available data. Always take into account that the treatment will also be dependent on availability of resources and existing treatment guidelines, considered by other Pain Management Practitioners as being common knowledge and practice, at the time of the intervention. For Medico-Legal purposes, it is also important to point out that variation in procedural techniques and pharmacological choices are the acceptable norm. The indications, contraindications, technique, and results of the above procedure should only be interpreted and judged by a Board-Certified Interventional Pain Specialist with extensive familiarity and expertise in the same exact procedure and technique.

## 2020-11-27 NOTE — Addendum Note (Signed)
Addended by: Delice Bison E on: 11/27/2020 10:51 AM   Modules accepted: Orders

## 2020-11-27 NOTE — Telephone Encounter (Signed)
I put him on for Aug 5th due to time and chairs is that ok DR. B

## 2020-11-28 ENCOUNTER — Other Ambulatory Visit: Payer: Self-pay

## 2020-11-28 ENCOUNTER — Ambulatory Visit (HOSPITAL_BASED_OUTPATIENT_CLINIC_OR_DEPARTMENT_OTHER): Payer: Medicare Other | Admitting: Pain Medicine

## 2020-11-28 ENCOUNTER — Other Ambulatory Visit (HOSPITAL_COMMUNITY): Payer: Self-pay

## 2020-11-28 ENCOUNTER — Encounter: Payer: Self-pay | Admitting: Pain Medicine

## 2020-11-28 VITALS — BP 130/75 | HR 136 | Temp 97.1°F | Ht 68.0 in | Wt 131.0 lb

## 2020-11-28 DIAGNOSIS — G893 Neoplasm related pain (acute) (chronic): Secondary | ICD-10-CM

## 2020-11-28 DIAGNOSIS — M4854XS Collapsed vertebra, not elsewhere classified, thoracic region, sequela of fracture: Secondary | ICD-10-CM

## 2020-11-28 DIAGNOSIS — Z79891 Long term (current) use of opiate analgesic: Secondary | ICD-10-CM | POA: Insufficient documentation

## 2020-11-28 DIAGNOSIS — Z79899 Other long term (current) drug therapy: Secondary | ICD-10-CM

## 2020-11-28 DIAGNOSIS — J189 Pneumonia, unspecified organism: Secondary | ICD-10-CM | POA: Diagnosis not present

## 2020-11-28 DIAGNOSIS — Z95828 Presence of other vascular implants and grafts: Secondary | ICD-10-CM | POA: Insufficient documentation

## 2020-11-28 DIAGNOSIS — C8590 Non-Hodgkin lymphoma, unspecified, unspecified site: Secondary | ICD-10-CM | POA: Insufficient documentation

## 2020-11-28 DIAGNOSIS — G894 Chronic pain syndrome: Secondary | ICD-10-CM | POA: Diagnosis not present

## 2020-11-28 DIAGNOSIS — R109 Unspecified abdominal pain: Secondary | ICD-10-CM | POA: Insufficient documentation

## 2020-11-28 DIAGNOSIS — Z978 Presence of other specified devices: Secondary | ICD-10-CM | POA: Insufficient documentation

## 2020-11-28 DIAGNOSIS — C9110 Chronic lymphocytic leukemia of B-cell type not having achieved remission: Secondary | ICD-10-CM | POA: Insufficient documentation

## 2020-11-28 DIAGNOSIS — C8300 Small cell B-cell lymphoma, unspecified site: Secondary | ICD-10-CM

## 2020-11-28 DIAGNOSIS — R1013 Epigastric pain: Secondary | ICD-10-CM

## 2020-11-28 DIAGNOSIS — G8929 Other chronic pain: Secondary | ICD-10-CM

## 2020-11-28 DIAGNOSIS — A411 Sepsis due to other specified staphylococcus: Secondary | ICD-10-CM | POA: Diagnosis not present

## 2020-11-28 DIAGNOSIS — Z5189 Encounter for other specified aftercare: Secondary | ICD-10-CM | POA: Insufficient documentation

## 2020-11-28 LAB — CULTURE, BLOOD (SINGLE)
Culture: NO GROWTH
Culture: NO GROWTH
Special Requests: ADEQUATE

## 2020-11-28 MED ORDER — PAIN MANAGEMENT IT PUMP REFILL: 1.0000 | Freq: Once | INTRATHECAL | 0 refills | Status: AC

## 2020-11-28 MED ORDER — OXYCODONE HCL 10 MG PO TABS: 10.0000 mg | ORAL_TABLET | ORAL | 0 refills | Status: DC | PRN

## 2020-11-28 MED FILL — Medication: INTRATHECAL | Qty: 1 | Status: AC

## 2020-11-28 NOTE — Progress Notes (Signed)
Safety precautions to be maintained throughout the outpatient stay will include: orient to surroundings, keep bed in low position, maintain call bell within reach at all times, provide assistance with transfer out of bed and ambulation.  

## 2020-11-28 NOTE — Patient Instructions (Addendum)
____________________________________________________________________________________________ Pt stated that he has narcan at home and his son is able to give him a dose if needed   Medication Rules  Purpose: To inform patients, and their family members, of our rules and regulations.  Applies to: All patients receiving prescriptions (written or electronic).  Pharmacy of record: Pharmacy where electronic prescriptions will be sent. If written prescriptions are taken to a different pharmacy, please inform the nursing staff. The pharmacy listed in the electronic medical record should be the one where you would like electronic prescriptions to be sent.  Electronic prescriptions: In compliance with the [place] (STOP) Act of 2017 (Session Lanny Cramp [phone]), effective April 29, 2018, all controlled substances must be electronically prescribed. Calling prescriptions to the pharmacy will cease to exist.  Prescription refills: Only during scheduled appointments. Applies to all prescriptions.  NOTE: The following applies primarily to controlled substances (Opioid* Pain Medications).   Type of encounter (visit): For patients receiving controlled substances, face-to-face visits are required. (Not an option or up to the patient.)  Patient's responsibilities: Pain Pills: Bring all pain pills to every appointment (except for procedure appointments). Pill Bottles: Bring pills in original pharmacy bottle. Always bring the newest bottle. Bring bottle, even if empty. Medication refills: You are responsible for knowing and keeping track of what medications you take and those you need refilled. The day before your appointment: write a list of all prescriptions that need to be refilled. The day of the appointment: give the list to the admitting nurse. Prescriptions will be written only during appointments. No prescriptions will be written on procedure days. If you forget  a medication: it will not be "Called in", "Faxed", or "electronically sent". You will need to get another appointment to get these prescribed. No early refills. Do not call asking to have your prescription filled early. Prescription Accuracy: You are responsible for carefully inspecting your prescriptions before leaving our office. Have the discharge nurse carefully go over each prescription with you, before taking them home. Make sure that your name is accurately spelled, that your address is correct. Check the name and dose of your medication to make sure it is accurate. Check the number of pills, and the written instructions to make sure they are clear and accurate. Make sure that you are given enough medication to last until your next medication refill appointment. Taking Medication: Take medication as prescribed. When it comes to controlled substances, taking less pills or less frequently than prescribed is permitted and encouraged. Never take more pills than instructed. Never take medication more frequently than prescribed.  Inform other Doctors: Always inform, all of your healthcare providers, of all the medications you take. Pain Medication from other Providers: You are not allowed to accept any additional pain medication from any other Doctor or Healthcare provider. There are two exceptions to this rule. (see below) In the event that you require additional pain medication, you are responsible for notifying us, as stated below. Cough Medicine: Often these contain an opioid, such as codeine or hydrocodone. Never accept or take cough medicine containing these opioids if you are already taking an opioid* medication. The combination may cause respiratory failure and death. Medication Agreement: You are responsible for carefully reading and following our Medication Agreement. This must be signed before receiving any prescriptions from our practice. Safely store a copy of your signed Agreement. Violations  to the Agreement will result in no further prescriptions. (Additional copies of our Medication Agreement are available upon request.)  Laws, Rules, & Regulations: All patients are expected to follow all Federal and [employer], [employer], Rules, [employer] [facility]. Ignorance of the Laws does not constitute a valid excuse.  Illegal drugs and Controlled Substances: The use of illegal substances (including, but not limited to marijuana and its derivatives) and/or the illegal use of any controlled substances is strictly prohibited. Violation of this rule may result in the immediate and permanent discontinuation of any and all prescriptions being written by our practice. The use of any illegal substances is prohibited. Adopted CDC guidelines & recommendations: Target dosing levels will be at or below 60 MME/day. Use of benzodiazepines** is not recommended.  Exceptions: There are only two exceptions to the rule of not receiving pain medications from other Healthcare Providers. Exception #1 (Emergencies): In the event of an emergency (i.e.: accident requiring emergency care), you are allowed to receive additional pain medication. However, you are responsible for: As soon as you are able, call our office [phone], at any time of the day or night, and leave a message stating your name, the date and nature of the emergency, and the name and dose of the medication prescribed. In the event that your call is answered by a member of our staff, make sure to document and save the date, time, and the name of the person that took your information.  Exception #2 (Planned Surgery): In the event that you are scheduled by another doctor or dentist to have any type of surgery or procedure, you are allowed (for a period no longer than 30 days), to receive additional pain medication, for the acute post-op pain. However, in this case, you are responsible for picking up a copy of our "Post-op Pain Management for Surgeons" handout, and  giving it to your surgeon or dentist. This document is available at our office, and does not require an appointment to obtain it. Simply go to our office during business hours (Monday-Thursday from 8:00 AM to 4:00 PM) (Friday 8:00 AM to 12:00 Noon) or if you have a scheduled appointment with [place], prior to your surgery, and ask for it by name. In addition, you are responsible for: calling our office [phone], at any time of the day or night, and leaving a message stating your name, name of your surgeon, type of surgery, and date of procedure or surgery. Failure to comply with your responsibilities may result in termination of therapy involving the controlled substances.  *Opioid medications include: morphine, codeine, oxycodone, oxymorphone, hydrocodone, hydromorphone, meperidine, tramadol, tapentadol, buprenorphine, fentanyl, methadone. **Benzodiazepine medications include: diazepam (Valium), alprazolam (Xanax), clonazepam (Klonopine), lorazepam (Ativan), clorazepate (Tranxene), chlordiazepoxide (Librium), estazolam (Prosom), oxazepam (Serax), temazepam (Restoril), triazolam (Halcion) (Last updated: 03/27/2020) ____________________________________________________________________________________________  ____________________________________________________________________________________________  Medication Recommendations and Reminders  Applies to: All patients receiving prescriptions (written and/or electronic).  Medication Rules & Regulations: These rules and regulations exist for your safety and that of others. They are not flexible and neither are we. Dismissing or ignoring them will be considered "non-compliance" with medication therapy, resulting in complete and irreversible termination of such therapy. (See document titled "Medication Rules" for more details.) In all conscience, because of safety reasons, we cannot continue providing a therapy where the patient does not follow  instructions.  Pharmacy of record:  Definition: This is the pharmacy where your electronic prescriptions will be sent.  We do not endorse any particular pharmacy, however, we have experienced problems with [facility] not securing enough medication supply for the community. We do not restrict you in your choice  of pharmacy. However, once we write for your prescriptions, we will NOT be re-sending more prescriptions to fix restricted supply problems created by your pharmacy, or your insurance.  The pharmacy listed in the electronic medical record should be the one where you want electronic prescriptions to be sent. If you choose to change pharmacy, simply notify our nursing staff.  Recommendations: Keep all of your pain medications in a safe place, under lock and key, even if you live alone. We will NOT replace lost, stolen, or damaged medication. After you fill your prescription, take 1 week's worth of pills and put them away in a safe place. You should keep a separate, properly labeled bottle for this purpose. The remainder should be kept in the original bottle. Use this as your primary supply, until it runs out. Once it's gone, then you know that you have 1 week's worth of medicine, and it is time to come in for a prescription refill. If you do this correctly, it is unlikely that you will ever run out of medicine. To make sure that the above recommendation works, it is very important that you make sure your medication refill appointments are scheduled at least 1 week before you run out of medicine. To do this in an effective manner, make sure that you do not leave the office without scheduling your next medication management appointment. Always ask the nursing staff to show you in your prescription , when your medication will be running out. Then arrange for the receptionist to get you a return appointment, at least 7 days before you run out of medicine. Do not wait until you have 1 or 2 pills left, to  come in. This is very poor planning and does not take into consideration that we may need to cancel appointments due to bad weather, sickness, or emergencies affecting our staff. DO NOT ACCEPT A "Partial Fill": If for any reason your pharmacy does not have enough pills/tablets to completely fill or refill your prescription, do not allow for a "partial fill". The law allows the pharmacy to complete that prescription within 72 hours, without requiring a new prescription. If they do not fill the rest of your prescription within those 72 hours, you will need a separate prescription to fill the remaining amount, which we will NOT provide. If the reason for the partial fill is your insurance, you will need to talk to the pharmacist about payment alternatives for the remaining tablets, but again, DO NOT ACCEPT A PARTIAL FILL, unless you can trust your pharmacist to obtain the remainder of the pills within 72 hours.  Prescription refills and/or changes in medication(s):  Prescription refills, and/or changes in dose or medication, will be conducted only during scheduled medication management appointments. (Applies to both, written and electronic prescriptions.) No refills on procedure days. No medication will be changed or started on procedure days. No changes, adjustments, and/or refills will be conducted on a procedure day. Doing so will interfere with the diagnostic portion of the procedure. No phone refills. No medications will be "called into the pharmacy". No Fax refills. No weekend refills. No Holliday refills. No after hours refills.  Remember:  Business hours are:  Monday to Thursday 8:00 AM to 4:00 PM Provider's Schedule: Milinda Pointer, MD - Appointments are:  Medication management: Monday and Wednesday 8:00 AM to 4:00 PM Procedure day: Tuesday and Thursday 7:30 AM to 4:00 PM Gillis Santa, MD - Appointments are:  Medication management: Tuesday and Thursday 8:00 AM to 4:00 PM  Procedure day:  Monday and Wednesday 7:30 AM to 4:00 PM (Last update: 11/17/2019) ____________________________________________________________________________________________  ____________________________________________________________________________________________  CBD (cannabidiol) WARNING  Applicable to: All individuals currently taking or considering taking CBD (cannabidiol) and, more important, all patients taking opioid analgesic controlled substances (pain medication). (Example: oxycodone; oxymorphone; hydrocodone; hydromorphone; morphine; methadone; tramadol; tapentadol; fentanyl; buprenorphine; butorphanol; dextromethorphan; meperidine; codeine; etc.)  Legal status: CBD remains a Schedule I drug prohibited for any use. CBD is illegal with one exception. In the [place], CBD has a limited Transport planner (FDA) approval for the treatment of two specific types of epilepsy disorders. Only one CBD product has been approved by the FDA for this purpose: "Epidiolex". FDA is aware that some companies are marketing products containing cannabis and cannabis-derived compounds in ways that violate the [employer], Drug and Cosmetic Act [facility] Act) and that may put the health and safety of consumers at risk. The FDA, a Federal agency, has not enforced the CBD status since 2018.   Legality: Some manufacturers ship CBD products nationally, which is illegal. Often such products are sold online and are therefore available throughout the country. CBD is openly sold in head shops and health food stores in some states where such sales have not been explicitly legalized. Selling unapproved products with unsubstantiated therapeutic claims is not only a violation of the law, but also can put patients at risk, as these products have not been proven to be safe or effective. Federal illegality makes it difficult to conduct research on CBD.  Reference: "FDA Regulation of Cannabis and Cannabis-Derived Products,  Including Cannabidiol (CBD)" - [employer]  Warning: CBD is not FDA approved and has not undergo the same manufacturing controls as prescription drugs.  This means that the purity and safety of available CBD may be questionable. Most of the time, despite manufacturer's claims, it is contaminated with THC (delta-9-tetrahydrocannabinol - the chemical in marijuana responsible for the "HIGH").  When this is the case, the [facility] contaminant will trigger a positive urine drug screen (UDS) test for Marijuana (carboxy-THC). Because a positive UDS for any illicit substance is a violation of our medication agreement, your opioid analgesics (pain medicine) may be permanently discontinued.  MORE ABOUT CBD  General Information: CBD  is a derivative of the Marijuana (cannabis sativa) plant discovered in 31. It is one of the 113 identified substances found in Marijuana. It accounts for up to 40% of the plant's extract. As of 2018, preliminary clinical studies on CBD included research for the treatment of anxiety, movement disorders, and pain. CBD is available and consumed in multiple forms, including inhalation of smoke or vapor, as an aerosol spray, and by mouth. It may be supplied as an oil containing CBD, capsules, dried cannabis, or as a liquid solution. CBD is thought not to be as psychoactive as THC (delta-9-tetrahydrocannabinol - the chemical in marijuana responsible for the "HIGH"). Studies suggest that CBD may interact with different biological target receptors in the body, including cannabinoid and other neurotransmitter receptors. As of 2018 the mechanism of action for its biological effects has not been determined.  Side-effects  Adverse reactions: Dry mouth, diarrhea, decreased appetite, fatigue, drowsiness, malaise, weakness, sleep disturbances, and others.  Drug interactions: CBC may  interact with other medications such as blood-thinners. (Last update: 12/04/2019) ____________________________________________________________________________________________  ____________________________________________________________________________________________  Drug Holidays (Slow)  What is a "Drug Holiday"? Drug Holiday: is the name given to the period of time during which a patient stops taking a medication(s) for the purpose of eliminating  tolerance to the drug.  Benefits Improved effectiveness of opioids. Decreased opioid dose needed to achieve benefits. Improved pain with lesser dose.  What is tolerance? Tolerance: is the progressive decreased in effectiveness of a drug due to its repetitive use. With repetitive use, the body gets use to the medication and as a consequence, it loses its effectiveness. This is a common problem seen with opioid pain medications. As a result, a larger dose of the drug is needed to achieve the same effect that used to be obtained with a smaller dose.  How long should a "Drug Holiday" last? You should stay off of the pain medicine for at least 14 consecutive days. (2 weeks)  Should I stop the medicine "cold [place]"? No. You should always coordinate with your Pain Specialist so that he/she can provide you with the correct medication dose to make the transition as smoothly as possible.  How do I stop the medicine? Slowly. You will be instructed to decrease the daily amount of pills that you take by one (1) pill every seven (7) days. This is called a "slow downward taper" of your dose. For example: if you normally take four (4) pills per day, you will be asked to drop this dose to three (3) pills per day for seven (7) days, then to two (2) pills per day for seven (7) days, then to one (1) per day for seven (7) days, and at the end of those last seven (7) days, this is when the "Drug Holiday" would start.   Will I have withdrawals? By doing a "slow  downward taper" like this one, it is unlikely that you will experience any significant withdrawal symptoms. Typically, what triggers withdrawals is the sudden stop of a high dose opioid therapy. Withdrawals can usually be avoided by slowly decreasing the dose over a prolonged period of time. If you do not follow these instructions and decide to stop your medication abruptly, withdrawals may be possible.  What are withdrawals? Withdrawals: refers to the wide range of symptoms that occur after stopping or dramatically reducing opiate drugs after heavy and prolonged use. Withdrawal symptoms do not occur to patients that use low dose opioids, or those who take the medication sporadically. Contrary to benzodiazepine (example: Valium, Xanax, etc.) or alcohol withdrawals ("Delirium Tremens"), opioid withdrawals are not lethal. Withdrawals are the physical manifestation of the body getting rid of the excess receptors.  Expected Symptoms Early symptoms of withdrawal may include: Agitation Anxiety Muscle aches Increased tearing Insomnia Runny nose Sweating Yawning  Late symptoms of withdrawal may include: Abdominal cramping Diarrhea Dilated pupils Goose bumps Nausea Vomiting  Will I experience withdrawals? Due to the slow nature of the taper, it is very unlikely that you will experience any.  What is a slow taper? Taper: refers to the gradual decrease in dose.  (Last update: 11/17/2019) ____________________________________________________________________________________________

## 2020-11-29 ENCOUNTER — Telehealth: Payer: Self-pay

## 2020-11-29 NOTE — Telephone Encounter (Signed)
Post IT pump refill folow up call.  LM

## 2020-11-30 ENCOUNTER — Inpatient Hospital Stay
Admission: EM | Admit: 2020-11-30 | Discharge: 2020-12-05 | DRG: 871 | Disposition: A | Discharge status: Home Health | Payer: Medicare Other | Attending: Internal Medicine | Admitting: Internal Medicine

## 2020-11-30 ENCOUNTER — Emergency Department: Payer: Medicare Other

## 2020-11-30 ENCOUNTER — Other Ambulatory Visit: Payer: Self-pay

## 2020-11-30 ENCOUNTER — Other Ambulatory Visit (HOSPITAL_COMMUNITY): Payer: Self-pay

## 2020-11-30 DIAGNOSIS — I129 Hypertensive chronic kidney disease with stage 1 through stage 4 chronic kidney disease, or unspecified chronic kidney disease: Secondary | ICD-10-CM | POA: Diagnosis present

## 2020-11-30 DIAGNOSIS — F17200 Nicotine dependence, unspecified, uncomplicated: Secondary | ICD-10-CM

## 2020-11-30 DIAGNOSIS — Z66 Do not resuscitate: Secondary | ICD-10-CM | POA: Diagnosis present

## 2020-11-30 DIAGNOSIS — D696 Thrombocytopenia, unspecified: Secondary | ICD-10-CM | POA: Diagnosis present

## 2020-11-30 DIAGNOSIS — R32 Unspecified urinary incontinence: Secondary | ICD-10-CM | POA: Diagnosis present

## 2020-11-30 DIAGNOSIS — E1122 Type 2 diabetes mellitus with diabetic chronic kidney disease: Secondary | ICD-10-CM | POA: Diagnosis present

## 2020-11-30 DIAGNOSIS — Z9049 Acquired absence of other specified parts of digestive tract: Secondary | ICD-10-CM

## 2020-11-30 DIAGNOSIS — N183 Chronic kidney disease, stage 3 unspecified: Secondary | ICD-10-CM | POA: Diagnosis present

## 2020-11-30 DIAGNOSIS — G894 Chronic pain syndrome: Secondary | ICD-10-CM | POA: Diagnosis present

## 2020-11-30 DIAGNOSIS — E273 Drug-induced adrenocortical insufficiency: Secondary | ICD-10-CM | POA: Diagnosis not present

## 2020-11-30 DIAGNOSIS — R109 Unspecified abdominal pain: Secondary | ICD-10-CM | POA: Diagnosis not present

## 2020-11-30 DIAGNOSIS — I10 Essential (primary) hypertension: Secondary | ICD-10-CM | POA: Diagnosis not present

## 2020-11-30 DIAGNOSIS — J181 Lobar pneumonia, unspecified organism: Secondary | ICD-10-CM | POA: Diagnosis present

## 2020-11-30 DIAGNOSIS — Y95 Nosocomial condition: Secondary | ICD-10-CM | POA: Diagnosis present

## 2020-11-30 DIAGNOSIS — K219 Gastro-esophageal reflux disease without esophagitis: Secondary | ICD-10-CM | POA: Diagnosis present

## 2020-11-30 DIAGNOSIS — D638 Anemia in other chronic diseases classified elsewhere: Secondary | ICD-10-CM | POA: Diagnosis not present

## 2020-11-30 DIAGNOSIS — N200 Calculus of kidney: Secondary | ICD-10-CM | POA: Diagnosis present

## 2020-11-30 DIAGNOSIS — D649 Anemia, unspecified: Secondary | ICD-10-CM | POA: Diagnosis not present

## 2020-11-30 DIAGNOSIS — E785 Hyperlipidemia, unspecified: Secondary | ICD-10-CM | POA: Diagnosis present

## 2020-11-30 DIAGNOSIS — C9110 Chronic lymphocytic leukemia of B-cell type not having achieved remission: Secondary | ICD-10-CM | POA: Diagnosis present

## 2020-11-30 DIAGNOSIS — E1165 Type 2 diabetes mellitus with hyperglycemia: Secondary | ICD-10-CM | POA: Diagnosis present

## 2020-11-30 DIAGNOSIS — F17210 Nicotine dependence, cigarettes, uncomplicated: Secondary | ICD-10-CM | POA: Diagnosis present

## 2020-11-30 DIAGNOSIS — R590 Localized enlarged lymph nodes: Secondary | ICD-10-CM | POA: Diagnosis present

## 2020-11-30 DIAGNOSIS — E2740 Unspecified adrenocortical insufficiency: Secondary | ICD-10-CM | POA: Diagnosis present

## 2020-11-30 DIAGNOSIS — D631 Anemia in chronic kidney disease: Secondary | ICD-10-CM | POA: Diagnosis present

## 2020-11-30 DIAGNOSIS — J189 Pneumonia, unspecified organism: Secondary | ICD-10-CM | POA: Diagnosis present

## 2020-11-30 DIAGNOSIS — Z79899 Other long term (current) drug therapy: Secondary | ICD-10-CM

## 2020-11-30 DIAGNOSIS — N390 Urinary tract infection, site not specified: Secondary | ICD-10-CM | POA: Diagnosis present

## 2020-11-30 DIAGNOSIS — R7881 Bacteremia: Secondary | ICD-10-CM | POA: Diagnosis not present

## 2020-11-30 DIAGNOSIS — K5903 Drug induced constipation: Secondary | ICD-10-CM | POA: Diagnosis present

## 2020-11-30 DIAGNOSIS — N400 Enlarged prostate without lower urinary tract symptoms: Secondary | ICD-10-CM | POA: Diagnosis present

## 2020-11-30 DIAGNOSIS — B958 Unspecified staphylococcus as the cause of diseases classified elsewhere: Secondary | ICD-10-CM | POA: Diagnosis not present

## 2020-11-30 DIAGNOSIS — I700 Atherosclerosis of aorta: Secondary | ICD-10-CM | POA: Diagnosis present

## 2020-11-30 DIAGNOSIS — Z961 Presence of intraocular lens: Secondary | ICD-10-CM | POA: Diagnosis present

## 2020-11-30 DIAGNOSIS — D539 Nutritional anemia, unspecified: Secondary | ICD-10-CM | POA: Diagnosis present

## 2020-11-30 DIAGNOSIS — F32A Depression, unspecified: Secondary | ICD-10-CM | POA: Diagnosis present

## 2020-11-30 DIAGNOSIS — Z20822 Contact with and (suspected) exposure to covid-19: Secondary | ICD-10-CM | POA: Diagnosis present

## 2020-11-30 DIAGNOSIS — Z7984 Long term (current) use of oral hypoglycemic drugs: Secondary | ICD-10-CM

## 2020-11-30 DIAGNOSIS — Z8249 Family history of ischemic heart disease and other diseases of the circulatory system: Secondary | ICD-10-CM

## 2020-11-30 DIAGNOSIS — Z9842 Cataract extraction status, left eye: Secondary | ICD-10-CM

## 2020-11-30 DIAGNOSIS — A411 Sepsis due to other specified staphylococcus: Secondary | ICD-10-CM | POA: Diagnosis present

## 2020-11-30 DIAGNOSIS — A419 Sepsis, unspecified organism: Secondary | ICD-10-CM | POA: Diagnosis not present

## 2020-11-30 DIAGNOSIS — T402X5A Adverse effect of other opioids, initial encounter: Secondary | ICD-10-CM | POA: Diagnosis present

## 2020-11-30 DIAGNOSIS — Z9841 Cataract extraction status, right eye: Secondary | ICD-10-CM

## 2020-11-30 DIAGNOSIS — K31819 Angiodysplasia of stomach and duodenum without bleeding: Secondary | ICD-10-CM | POA: Diagnosis present

## 2020-11-30 DIAGNOSIS — G8929 Other chronic pain: Secondary | ICD-10-CM | POA: Diagnosis not present

## 2020-11-30 LAB — CBC
HCT: 27.2 % — ABNORMAL LOW (ref 39.0–52.0)
Hemoglobin: 9.1 g/dL — ABNORMAL LOW (ref 13.0–17.0)
MCH: 34.2 pg — ABNORMAL HIGH (ref 26.0–34.0)
MCHC: 33.5 g/dL (ref 30.0–36.0)
MCV: 102.3 fL — ABNORMAL HIGH (ref 80.0–100.0)
Platelets: 80 10*3/uL — ABNORMAL LOW (ref 150–400)
RBC: 2.66 MIL/uL — ABNORMAL LOW (ref 4.22–5.81)
RDW: 21.7 % — ABNORMAL HIGH (ref 11.5–15.5)
WBC: 16 10*3/uL — ABNORMAL HIGH (ref 4.0–10.5)
nRBC: 0 % (ref 0.0–0.2)

## 2020-11-30 LAB — HEPATIC FUNCTION PANEL
ALT: 12 U/L (ref 0–44)
AST: 21 U/L (ref 15–41)
Albumin: 3.7 g/dL (ref 3.5–5.0)
Alkaline Phosphatase: 71 U/L (ref 38–126)
Bilirubin, Direct: 0.9 mg/dL — ABNORMAL HIGH (ref 0.0–0.2)
Indirect Bilirubin: 1.7 mg/dL — ABNORMAL HIGH (ref 0.3–0.9)
Total Bilirubin: 2.6 mg/dL — ABNORMAL HIGH (ref 0.3–1.2)
Total Protein: 6.3 g/dL — ABNORMAL LOW (ref 6.5–8.1)

## 2020-11-30 LAB — LIPASE, BLOOD: Lipase: 20 U/L (ref 11–51)

## 2020-11-30 LAB — BASIC METABOLIC PANEL
Anion gap: 10 (ref 5–15)
BUN: 22 mg/dL (ref 8–23)
CO2: 26 mmol/L (ref 22–32)
Calcium: 8.5 mg/dL — ABNORMAL LOW (ref 8.9–10.3)
Chloride: 108 mmol/L (ref 98–111)
Creatinine, Ser: 1.23 mg/dL (ref 0.61–1.24)
GFR, Estimated: >60 mL/min (ref >60)
Glucose, Bld: 149 mg/dL — ABNORMAL HIGH (ref 70–99)
Potassium: 4 mmol/L (ref 3.5–5.1)
Sodium: 144 mmol/L (ref 135–145)

## 2020-11-30 LAB — GLUCOSE, CAPILLARY
Glucose-Capillary: 181 mg/dL — ABNORMAL HIGH (ref 70–99)
Glucose-Capillary: 242 mg/dL — ABNORMAL HIGH (ref 70–99)

## 2020-11-30 LAB — PROTIME-INR
INR: 1.4 — ABNORMAL HIGH (ref 0.8–1.2)
Prothrombin Time: 17.2 seconds — ABNORMAL HIGH (ref 11.4–15.2)

## 2020-11-30 LAB — HEMOGLOBIN A1C
Hgb A1c MFr Bld: 5.6 % (ref 4.8–5.6)
Mean Plasma Glucose: 114.02 mg/dL

## 2020-11-30 LAB — RESP PANEL BY RT-PCR (FLU A&B, COVID) ARPGX2
Influenza A by PCR: NEGATIVE
Influenza B by PCR: NEGATIVE
SARS Coronavirus 2 by RT PCR: NEGATIVE

## 2020-11-30 LAB — LACTIC ACID, PLASMA
Lactic Acid, Venous: 2.6 mmol/L (ref 0.5–1.9)
Lactic Acid, Venous: 2.5 mmol/L (ref 0.5–1.9)

## 2020-11-30 LAB — APTT: aPTT: 28 seconds (ref 24–36)

## 2020-11-30 MED ORDER — HYDROCORTISONE 10 MG PO TABS
30.0000 mg | ORAL_TABLET | Freq: Every day | ORAL | Status: DC
  Administered 2020-11-30: 30 mg via ORAL
  Filled 2020-11-30 (×2): qty 3

## 2020-11-30 MED ORDER — LACTATED RINGERS IV SOLN: INTRAVENOUS | Status: DC

## 2020-11-30 MED ORDER — HYDROCORTISONE 10 MG PO TABS: 20.0000 mg | ORAL_TABLET | Freq: Every evening | ORAL | Status: DC

## 2020-11-30 MED ORDER — VANCOMYCIN HCL IN DEXTROSE 1-5 GM/200ML-% IV SOLN: 1000.0000 mg | INTRAVENOUS | Status: DC

## 2020-11-30 MED ORDER — MORPHINE SULFATE (PF) 4 MG/ML IV SOLN
4.0000 mg | Freq: Once | INTRAVENOUS | Status: AC
  Administered 2020-11-30: 4 mg via INTRAVENOUS
  Filled 2020-11-30: qty 1

## 2020-11-30 MED ORDER — SODIUM CHLORIDE 0.9 % IV SOLN: 1.0000 g | Freq: Two times a day (BID) | INTRAVENOUS | Status: DC

## 2020-11-30 MED ORDER — PANTOPRAZOLE SODIUM 40 MG PO TBEC
40.0000 mg | DELAYED_RELEASE_TABLET | Freq: Every day | ORAL | Status: DC
  Administered 2020-11-30 – 2020-12-04 (×5): 40 mg via ORAL
  Filled 2020-11-30 (×5): qty 1

## 2020-11-30 MED ORDER — VANCOMYCIN HCL IN DEXTROSE 1-5 GM/200ML-% IV SOLN: 1000.0000 mg | Freq: Once | INTRAVENOUS | Status: DC

## 2020-11-30 MED ORDER — LACTATED RINGERS IV BOLUS
500.0000 mL | Freq: Once | INTRAVENOUS | Status: AC
  Administered 2020-11-30: 500 mL via INTRAVENOUS

## 2020-11-30 MED ORDER — ACETAMINOPHEN 325 MG PO TABS
650.0000 mg | ORAL_TABLET | Freq: Four times a day (QID) | ORAL | Status: DC | PRN
  Administered 2020-11-30: 650 mg via ORAL
  Filled 2020-11-30: qty 2

## 2020-11-30 MED ORDER — ADULT MULTIVITAMIN W/MINERALS CH
1.0000 | ORAL_TABLET | Freq: Every day | ORAL | Status: DC
  Administered 2020-12-01 – 2020-12-04 (×4): 1 via ORAL
  Filled 2020-11-30 (×4): qty 1

## 2020-11-30 MED ORDER — NALOXEGOL OXALATE 12.5 MG PO TABS
12.5000 mg | ORAL_TABLET | Freq: Every day | ORAL | Status: DC
  Administered 2020-12-01 – 2020-12-05 (×5): 12.5 mg via ORAL
  Filled 2020-11-30 (×5): qty 1

## 2020-11-30 MED ORDER — SODIUM CHLORIDE 0.9 % IV BOLUS
500.0000 mL | Freq: Once | INTRAVENOUS | Status: AC
  Administered 2020-11-30: 500 mL via INTRAVENOUS

## 2020-11-30 MED ORDER — SODIUM CHLORIDE 0.9 % IV SOLN
1.0000 g | Freq: Two times a day (BID) | INTRAVENOUS | Status: DC
  Administered 2020-11-30 – 2020-12-01 (×2): 1 g via INTRAVENOUS
  Filled 2020-11-30 (×3): qty 1

## 2020-11-30 MED ORDER — ONDANSETRON HCL 4 MG PO TABS: 4.0000 mg | ORAL_TABLET | Freq: Four times a day (QID) | ORAL | Status: DC | PRN

## 2020-11-30 MED ORDER — ACETAMINOPHEN 650 MG RE SUPP: 650.0000 mg | Freq: Four times a day (QID) | RECTAL | Status: DC | PRN

## 2020-11-30 MED ORDER — VANCOMYCIN HCL IN DEXTROSE 1-5 GM/200ML-% IV SOLN
1000.0000 mg | INTRAVENOUS | Status: DC
  Filled 2020-11-30: qty 200

## 2020-11-30 MED ORDER — GABAPENTIN 100 MG PO CAPS
200.0000 mg | ORAL_CAPSULE | Freq: Two times a day (BID) | ORAL | Status: DC
  Administered 2020-11-30 – 2020-12-05 (×10): 200 mg via ORAL
  Filled 2020-11-30 (×10): qty 2

## 2020-11-30 MED ORDER — VANCOMYCIN HCL IN DEXTROSE 1-5 GM/200ML-% IV SOLN
1000.0000 mg | Freq: Once | INTRAVENOUS | Status: AC
  Administered 2020-11-30: 1000 mg via INTRAVENOUS
  Filled 2020-11-30: qty 200

## 2020-11-30 MED ORDER — INSULIN ASPART 100 UNIT/ML IJ SOLN
0.0000 [IU] | Freq: Three times a day (TID) | INTRAMUSCULAR | Status: DC
  Administered 2020-12-01 – 2020-12-03 (×5): 1 [IU] via SUBCUTANEOUS
  Administered 2020-12-03 – 2020-12-04 (×2): 2 [IU] via SUBCUTANEOUS
  Administered 2020-12-04: 1 [IU] via SUBCUTANEOUS
  Administered 2020-12-04: 2 [IU] via SUBCUTANEOUS
  Filled 2020-11-30 (×9): qty 1

## 2020-11-30 MED ORDER — OXYCODONE HCL 5 MG PO TABS
10.0000 mg | ORAL_TABLET | Freq: Four times a day (QID) | ORAL | Status: DC | PRN
  Administered 2020-11-30 – 2020-12-05 (×11): 10 mg via ORAL
  Filled 2020-11-30 (×11): qty 2

## 2020-11-30 MED ORDER — ONDANSETRON HCL 4 MG/2ML IJ SOLN: 4.0000 mg | Freq: Four times a day (QID) | INTRAMUSCULAR | Status: DC | PRN

## 2020-11-30 MED ORDER — ONDANSETRON HCL 4 MG/2ML IJ SOLN
4.0000 mg | INTRAMUSCULAR | Status: AC
  Administered 2020-11-30: 4 mg via INTRAVENOUS
  Filled 2020-11-30: qty 2

## 2020-11-30 MED ORDER — SODIUM CHLORIDE 0.9 % IV SOLN: 1.0000 g | Freq: Once | INTRAVENOUS | Status: DC

## 2020-11-30 MED ORDER — NICOTINE 14 MG/24HR TD PT24
14.0000 mg | MEDICATED_PATCH | Freq: Every day | TRANSDERMAL | Status: DC
  Administered 2020-11-30 – 2020-12-05 (×6): 14 mg via TRANSDERMAL
  Filled 2020-11-30 (×6): qty 1

## 2020-11-30 MED ORDER — SODIUM CHLORIDE 0.9 % IV SOLN
2.0000 g | Freq: Once | INTRAVENOUS | Status: AC
  Administered 2020-11-30: 2 g via INTRAVENOUS
  Filled 2020-11-30: qty 2

## 2020-11-30 MED ORDER — IOHEXOL 350 MG/ML SOLN
75.0000 mL | Freq: Once | INTRAVENOUS | Status: AC | PRN
  Administered 2020-11-30: 75 mL via INTRAVENOUS
  Filled 2020-11-30: qty 75

## 2020-11-30 MED ORDER — FOLIC ACID 1 MG PO TABS
1.0000 mg | ORAL_TABLET | Freq: Every day | ORAL | Status: DC
  Administered 2020-12-01 – 2020-12-05 (×5): 1 mg via ORAL
  Filled 2020-11-30 (×5): qty 1

## 2020-11-30 MED ORDER — IBRUTINIB 420 MG PO TABS: 420.0000 mg | ORAL_TABLET | Freq: Every day | ORAL | Status: DC

## 2020-11-30 NOTE — Progress Notes (Addendum)
Pharmacy Antibiotic Note  James [name] is a 70 y.o. male admitted on 11/30/2020 with pneumonia/sepsis.  History significant for CLL on chemotherapy, DM, and depression. Recently discharged from acute care in July/30/2022. Pharmacy has been consulted for Vancomycin and meropenem dosing.  Plan:  Vancomycin 1000 mg IV Q 24 hrs Goal AUC 400-600 Expected AUC: 553  SCr used: 1.23  Meropenem 1gm every 12 hours  Height: 5\' 8"  (172.7 cm) Weight: 59 kg (130 lb 1.1 oz) IBW/kg (Calculated) : 68.4  Temp (24hrs), Avg:99.3 F (37.4 C), Min:99.3 F (37.4 C), Max:99.3 F (37.4 C)  Recent Labs  Lab 11/30/20 1133 11/30/20 1432  WBC 16.0*  --   CREATININE 1.23  --   LATICACIDVEN  --  2.6*    Estimated Creatinine Clearance: 46 mL/min (by C-G formula based on SCr of 1.23 mg/dL).    No Known Allergies  Antimicrobials this admission: 8/4 Vancomycin >>  8/4 Cefepime x 1 dose in ED 8/4 Meropenem >  Microbiology results: 8/4 BCx: pending  Thank you for allowing pharmacy to be a part of this patient's care.  James [name] PharmD, BCPS 11/30/2020 5:02 PM

## 2020-11-30 NOTE — Sepsis Progress Note (Signed)
Sepsis protocol is being followed by [facility]. 

## 2020-11-30 NOTE — ED Notes (Signed)
Admitting provider at bedside.

## 2020-11-30 NOTE — H&P (Addendum)
Symptom History and Physical    CRESPIN FORSTROM INO:[id number] DOB: Dec 23, 1950 DOA: 11/30/2020  PCP: [name], MD   Patient coming from: Home  I have personally briefly reviewed patient's old medical records in [place]  Chief Complaint: Arne Cleveland  HPI: RIPKEN REKOWSKI is a 70 y.o. male with medical history significant for CLL on chemotherapy, opioid-induced constipation, diabetes mellitus, depression, GERD who was recently discharged from the hospital on July 30 , 2022 following admission for symptomatic anemia.  He presents presents to the emergency room via EMS for evaluation of weakness and fever.  Per patient he has been very weak since he left the hospital but started having a fever over the last couple of days.  He denies having a cough and denies feeling dizzy or lightheaded.  He has not had any recent falls. He has abdominal pain mostly in the epigastrium which is chronic but denies having any changes in his bowel habits, no leg swelling, no shortness of breath, no urinary symptoms, no palpitations or diaphoresis. He denies having any difficulty or painful swallowing. Labs show sodium 144, potassium 4.0, chloride 108, bicarb 26, glucose 149, BUN 22, creatinine 1.23, calcium 8.5, alkaline phosphatase 71, albumin 3.7, lipase 20, AST 21, ALT 12, total protein 6.3, lactic acid 2.6, white count 16, hemoglobin 9.1, hematocrit 27.2, MCV 102.3, RDW 21.7, platelet count 80, PT 17.2, INR 1.4 Respiratory viral panel is negative Chest x-ray reviewed by me shows Left lower lobe pneumonia/aspiration pneumonia Followup PA and lateral chest X-ray is recommended in 3-4 weeks following therapy to ensure resolution and exclude underlying malignancy. CT scan of the abdomen and pelvis shows  new airspace opacity within the left lower lobe compatible with pneumonia. Stable retroperitoneal lymphadenopathy. Non obstructing right nephrolithiasis. Prostatomegaly. Aortic  atherosclerosis Twelve-lead EKG reviewed by me shows sinus tachycardia.     ED Course: Patient is a 70 year old male who was recently discharged from the hospital about 4 days ago after treatment for symptomatic anemia. He presents to the ER today for evaluation of fever and weakness and was found to have a T-max of 102.7, he was tachycardic, has elevated lactic acid level as well as leukocytosis and a left lower lobe pneumonia. He received sepsis IV fluids in the ER as well as IV antibiotics and will be admitted to the hospital for further evaluation.   Review of Systems: As per HPI otherwise all other systems reviewed and negative.    Past Medical History:  Diagnosis Date   CLL (chronic lymphocytic leukemia) (HCC)    Constipation    Depression    Diabetes mellitus without complication (HCC)    GERD (gastroesophageal reflux disease)    Hematuria    Hyperlipidemia    Hypertension    Therapeutic opioid induced constipation     Past Surgical History:  Procedure Laterality Date   CATARACT EXTRACTION W/ INTRAOCULAR LENS IMPLANT Bilateral    CHOLECYSTECTOMY  1983   COLONOSCOPY WITH PROPOFOL N/A 10/27/2017   Procedure: COLONOSCOPY WITH PROPOFOL;  Surgeon: Manya Silvas, MD;  Location: [facility] ENDOSCOPY;  Service: Endoscopy;  Laterality: N/A;   ESOPHAGOGASTRODUODENOSCOPY (EGD) WITH PROPOFOL N/A 11/20/2016   Procedure: ESOPHAGOGASTRODUODENOSCOPY (EGD) WITH PROPOFOL;  Surgeon: Manya Silvas, MD;  Location: [facility] ENDOSCOPY;  Service: Endoscopy;  Laterality: N/A;   ESOPHAGOGASTRODUODENOSCOPY (EGD) WITH PROPOFOL N/A 10/27/2017   Procedure: ESOPHAGOGASTRODUODENOSCOPY (EGD) WITH PROPOFOL;  Surgeon: Manya Silvas, MD;  Location: [facility] ENDOSCOPY;  Service: Endoscopy;  Laterality: N/A;   ESOPHAGOGASTRODUODENOSCOPY (EGD) WITH PROPOFOL  N/A 11/23/2020   Procedure: ESOPHAGOGASTRODUODENOSCOPY (EGD) WITH PROPOFOL;  Surgeon: Jonathon Bellows, MD;  Location: [facility] ENDOSCOPY;  Service: Gastroenterology;   Laterality: N/A;   INTRATHECAL PUMP IMPLANT Left 02/07/2020   Procedure: INTRATHECAL PUMP & CATHETER IMPLANT;  Surgeon: Deetta Perla, MD;  Location: [facility] ORS;  Service: Neurosurgery;  Laterality: Left;   LOWER EXTREMITY ANGIOGRAPHY Right 05/19/2017   Procedure: LOWER EXTREMITY ANGIOGRAPHY;  Surgeon: Algernon Huxley, MD;  Location: [place];  Service: Cardiovascular;  Laterality: Right;   LOWER EXTREMITY ANGIOGRAPHY Left 08/10/2018   Procedure: LOWER EXTREMITY ANGIOGRAPHY;  Surgeon: Algernon Huxley, MD;  Location: [place] [facility];  Service: Cardiovascular;  Laterality: Left;     reports that he has been smoking cigarettes. He has a 23.50 pack-year smoking history. He has never used smokeless tobacco. He reports current alcohol use of about 1.0 standard drink of alcohol per week. He reports that he does not use drugs.  No Known Allergies  Family History  Problem Relation Age of Onset   Hypertension Sister       Prior to Admission medications   Medication Sig Start Date End Date Taking? Authorizing Provider  AMBULATORY NON FORMULARY MEDICATION Medication Name: Intrathecal pump Bupivacaine 20.0 mg/ml Fentanyl 2,000.0 mcg/ml Dose 1846.4 mcg/day    [provider]  Ensure (ENSURE) Take 237 mLs by mouth 3 (three) times daily between meals.     [provider]  folic acid (FOLVITE) 1 MG tablet Take 1 tablet (1 mg total) by mouth daily. 11/09/19   Cammie Sickle, MD  gabapentin (NEURONTIN) 300 MG capsule Take 3 capsules (900 mg total) by mouth at bedtime AND 1 capsule (300 mg total) 2 (two) times daily. 07/31/20 10/29/20  Milinda Pointer, MD  hydrocortisone (CORTEF) 10 MG tablet Take 3 tablets (30 mg total) by mouth daily. 11/25/20   [name], MD  hydrocortisone (CORTEF) 20 MG tablet Take 1 tablet (20 mg total) by mouth every evening. 11/25/20   [name], MD  ibrutinib 420 MG TABS Take 420 mg by mouth daily. 08/31/20   Cammie Sickle, MD  metFORMIN  (GLUCOPHAGE) 1000 MG tablet Take 1 tablet (1,000 mg total) by mouth daily with breakfast. 10/04/20   Jacquelin Hawking, NP  Multiple Vitamin (MULTIVITAMIN WITH MINERALS) TABS tablet Take 1 tablet by mouth daily. 11/25/20   [name], MD  naloxegol oxalate (MOVANTIK) 25 MG TABS tablet Take 1 tablet (25 mg total) by mouth daily. 09/28/20 12/27/20  Milinda Pointer, MD  naloxone [facility]) 2 MG/2ML injection Inject 1 mL (1 mg total) into the muscle as needed for up to 2 doses (for opioid overdose). In case of emergency (overdose), inject into muscle of upper arm or leg and call 911. 03/02/20   Milinda Pointer, MD  Oxycodone HCl 10 MG TABS Take 1 tablet (10 mg total) by mouth every 4 (four) hours as needed. Must last 30 days 12/08/20 01/07/21  Milinda Pointer, MD  Oxycodone HCl 10 MG TABS Take 1 tablet (10 mg total) by mouth every 4 (four) hours as needed. Must last 30 days 01/07/21 02/06/21  Milinda Pointer, MD  pantoprazole (PROTONIX) 40 MG tablet Take 40 mg by mouth at bedtime. 12/24/19   [provider]    Physical Exam: Vitals:   11/30/20 1415 11/30/20 1435 11/30/20 1500 11/30/20 1530  BP: 123/69 (!) 133/95 133/60 (!) 118/59  Pulse: 99 65 90 83  Resp: 10 16 15 19   Temp:      TempSrc:  SpO2: 96% 100% 93% 95%  Weight:      Height:         Vitals:   11/30/20 1415 11/30/20 1435 11/30/20 1500 11/30/20 1530  BP: 123/69 (!) 133/95 133/60 (!) 118/59  Pulse: 99 65 90 83  Resp: 10 16 15 19   Temp:      TempSrc:      SpO2: 96% 100% 93% 95%  Weight:      Height:          Constitutional: Alert and oriented x 3 . Not in any apparent distress.  Chronically ill-appearing.  Generalized pallor HEENT:      Head: Normocephalic and atraumatic.         Eyes: PERLA, EOMI, Conjunctivae are normal. Sclera is non-icteric.       Mouth/Throat: Mucous membranes are moist.       Neck: Supple with no signs of meningismus. Cardiovascular: Tachycardic. No murmurs, gallops, or rubs. 2+  symmetrical distal pulses are present . No JVD. No LE edema Respiratory: Respiratory effort normal .rhonchi left lung base . No wheezes or crackles Gastrointestinal: Soft,  tender in the epigastrium, and non distended with positive bowel sounds.  Genitourinary: No CVA tenderness. Musculoskeletal: Nontender with normal range of motion in all extremities. No cyanosis, or erythema of extremities. Neurologic:  Face is symmetric. Moving all extremities. No gross focal neurologic deficits .  Generalized weakness Skin: Skin is warm, dry.  Ecchymosis over both forearms Psychiatric: Mood and affect are normal    Labs on Admission: I have personally reviewed following labs and imaging studies  CBC: Recent Labs  Lab 11/24/20 0025 11/24/20 1149 11/24/20 2344 11/25/20 0423 11/30/20 1133  WBC  --   --   --   --  16.0*  HGB 7.9* 8.5* 8.7* 8.7* 9.1*  HCT 23.0* 25.2* 25.4*  --  27.2*  MCV  --   --   --   --  102.3*  PLT  --   --   --   --  80*   Basic Metabolic Panel: Recent Labs  Lab 11/30/20 1133  NA 144  K 4.0  CL 108  CO2 26  GLUCOSE 149*  BUN 22  CREATININE 1.23  CALCIUM 8.5*   GFR: Estimated Creatinine Clearance: 46 mL/min (by C-G formula based on SCr of 1.23 mg/dL). Liver Function Tests: Recent Labs  Lab 11/30/20 1432  AST 21  ALT 12  ALKPHOS 71  BILITOT 2.6*  PROT 6.3*  ALBUMIN 3.7   Recent Labs  Lab 11/30/20 1432  LIPASE 20   No results for input(s): AMMONIA in the last 168 hours. Coagulation Profile: Recent Labs  Lab 11/30/20 1432  INR 1.4*   Cardiac Enzymes: No results for input(s): CKTOTAL, CKMB, CKMBINDEX, TROPONINI in the last 168 hours. BNP (last 3 results) No results for input(s): PROBNP in the last 8760 hours. HbA1C: No results for input(s): HGBA1C in the last 72 hours. CBG: Recent Labs  Lab 11/24/20 0740 11/24/20 1136 11/24/20 1651 11/25/20 0628 11/25/20 0751  GLUCAP 138* 128* 134* 124* 107*   Lipid Profile: No results for input(s):  CHOL, HDL, LDLCALC, TRIG, CHOLHDL, LDLDIRECT in the last 72 hours. Thyroid Function Tests: No results for input(s): TSH, T4TOTAL, FREET4, T3FREE, THYROIDAB in the last 72 hours. Anemia Panel: No results for input(s): VITAMINB12, FOLATE, FERRITIN, TIBC, IRON, RETICCTPCT in the last 72 hours. Urine analysis:    Component Value Date/Time   COLORURINE YELLOW (A) 11/21/2020 1624   APPEARANCEUR HAZY (A)  11/21/2020 1624   LABSPEC 1.027 11/21/2020 1624   PHURINE 5.0 11/21/2020 1624   GLUCOSEU NEGATIVE 11/21/2020 1624   HGBUR NEGATIVE 11/21/2020 1624   BILIRUBINUR NEGATIVE 11/21/2020 1624   KETONESUR 5 (A) 11/21/2020 1624   PROTEINUR NEGATIVE 11/21/2020 1624   NITRITE NEGATIVE 11/21/2020 1624   LEUKOCYTESUR NEGATIVE 11/21/2020 1624    Radiological Exams on Admission: CT ABDOMEN PELVIS W CONTRAST  Result Date: 11/30/2020 CLINICAL DATA:  Epigastric pain for 5 days.  Generalized weakness EXAM: CT ABDOMEN AND PELVIS WITH CONTRAST TECHNIQUE: Multidetector CT imaging of the abdomen and pelvis was performed using the standard protocol following bolus administration of intravenous contrast. CONTRAST:  17mL OMNIPAQUE IOHEXOL 350 MG/ML SOLN COMPARISON:  11/21/2020 FINDINGS: Lower chest: New mixed density airspace opacity within the left lower lobe. Right lung bases clear. Heart size is normal. Hepatobiliary: Status post cholecystectomy. Similar degree of intrahepatic biliary dilatation. No focal liver lesion is identified. Pancreas: Unremarkable. No pancreatic ductal dilatation or surrounding inflammatory changes. Spleen: Spleen is upper limits of normal in size without focal lesion. Adrenals/Urinary Tract: Unremarkable adrenal glands. Stable bilateral renal cysts. Two tiny right-sided renal stones without hydronephrosis. No left-sided renal stone or hydronephrosis. Urinary bladder is mildly distended but appears otherwise unremarkable. Stomach/Bowel: Stomach is within normal limits. Appendix appears normal. No  evidence of bowel wall thickening, distention, or inflammatory changes. Vascular/Lymphatic: Advanced atherosclerosis throughout the aortoiliac axis with bi-iliac stents. No aneurysm. Similar degree of retroperitoneal lymphadenopathy. Reference left para-aortic nodal mass measuring approximately 4.3 x 2.4 cm (series 3, image 36), unchanged. Reproductive: Prostatomegaly. Other: No free fluid. No abdominopelvic fluid collection. No pneumoperitoneum. No abdominal wall hernia. Musculoskeletal: No acute or significant osseous findings. Neurostimulator is again seen entering at the L3-4 level and extending into the thoracic region, beyond the field of view. IMPRESSION: 1. New airspace opacity within the left lower lobe compatible with pneumonia. 2. Stable retroperitoneal lymphadenopathy. 3. Nonobstructing right nephrolithiasis. 4. Prostatomegaly. 5. Aortic atherosclerosis (ICD10-I70.0). Electronically Signed   By: Davina Poke D.O.   On: 11/30/2020 15:31   DG Chest Port 1 View  Result Date: 11/30/2020 CLINICAL DATA:  Questionable sepsis. EXAM: PORTABLE CHEST 1 VIEW COMPARISON:  Chest x-ray 11/21/2020, CT chest 01/24/2020 FINDINGS: The heart size and mediastinal contours are unchanged. Aortic calcification. Interval development of patchy airspace opacity of the left lower lobe. No pulmonary edema. No pleural effusion. No pneumothorax. No acute osseous abnormality. Degenerative changes of bilateral acromioclavicular joints. IMPRESSION: Left lower lobe pneumonia/aspiration pneumonia Followup PA and lateral chest X-ray is recommended in 3-4 weeks following therapy to ensure resolution and exclude underlying malignancy. Electronically Signed   By: Iven Finn M.D.   On: 11/30/2020 15:34     Assessment/Plan Principal Problem:   Sepsis ([place]) Active Problems:   Hypertension   Anemia due to stage 3 chronic kidney disease ([place])   CKD stage 3 secondary to diabetes [facility])   Current smoker   Chronic pain syndrome    Adrenal insufficiency due to cancer therapy ([place])   Depression   HCAP (healthcare-associated pneumonia)       Sepsis from healthcare associated pneumonia (POA) As evidenced by fever with a T-max of 102.7, tachycardia, lactic acidosis, marked leukocytosis and chest x-ray findings of left lower lobe pneumonia.. Continue aggressive IV fluid resuscitation Place patient empirically on meropenem and vancomycin Trend lactate levels Speech therapy consult for swallow function evaluation Follow-up results of blood cultures     History of CLL Continue Decadron and Ibrutinib  Nicotine dependence Smoking cessation has been discussed with patient in detail We will place patient on a nicotine transdermal patch 14 mg daily        Adrenal insufficiency Continue scheduled hydrocortisone       Diabetes mellitus with complications of stage III chronic kidney disease Place patient on consistent carbohydrate diet Glycemic control with sliding scale insulin Renal function is stable   Chronic pain syndrome Continue oxycodone and gabapentin    Thrombocytopenia Most likely related to history of CLL With no evidence of bleeding Monitor platelet count closely  DVT prophylaxis: SCD  Code Status: DO NOT RESUSCITATE Family Communication: Greater than 50% of time was spent discussing patient's condition and plan of care with him at the bedside.  All questions and concerns have been addressed.  He verbalizes understanding and agrees with the plan.  CODE STATUS was discussed and he is a DNR.  He lists his son as his healthcare power of attorney. Disposition Plan: Back to previous home environment Consults called: Oncology Status: At the time of admission, it appears that the appropriate admission status for this patient is inpatient. This is judged to be reasonable and necessary in order to provide the required intensity of service to ensure the patient's safety given the presenting  symptoms, physical exam findings, and initial radiographic and laboratory data in the context of their comorbid conditions. Patient requires inpatient status due to high intensity of service, high risk for further deterioration and high frequency of surveillance required.    Collier Bullock MD [facility]     11/30/2020, 4:52 PM

## 2020-11-30 NOTE — ED Triage Notes (Addendum)
Pt arrives via ems from home with c/o generalized abd pain x 5 years, foul urine odor with cloudy appearance. 322ml NS administered by ems prior to arrival. Pt a&o x 4 pt appears pale and weak. Pt seems to get slightly confused when answering a couple questions in triage. Pt reported that the current month is may. . Denies SOB, NVD.   Seen here 7/26-7/30 for symptomatic anemia.  18g left AC started by EMS  Cbg 137 T- 102.7 oral HR 107 96% RA 32co2 12 RR

## 2020-11-30 NOTE — ED Notes (Signed)
Lab contacted for lactic repeat. Report to [place], Therapist, sports. Patient awaiting transport to floor.

## 2020-11-30 NOTE — ED Provider Notes (Signed)
[facility] Emergency Department Provider Note   ____________________________________________   Event Date/Time   First MD Initiated Contact with Patient 11/30/20 1356     (approximate)  I have reviewed the triage vital signs and the nursing notes.   HISTORY  Chief Complaint Urinary Tract Infection, Fever, and Fatigue    HPI James [name] is a 69 y.o. male with a history of chronic leukemia, diabetes hypertension hyperlipidemia and recent evaluation for anemia and AVM  Patient reports that he has had intermittent abdominal pain located in the mid upper abdomen for 5 days, but also reports that it is an issue that been off and on for him for at least 5 years.  But he also noticed his urine was cloudy smelled funny and that he urinated on himself and had some incontinence which is atypical  Triage reported the patient initially was confused, however he is noted to be well oriented reports its August 2022.  Denies any headache or head injury.  Does report though that he felt fatigued a little weak and perhaps a little confused earlier today.  No chest pain no trouble breathing.  Has not been vomiting does report mild nausea.  Previous cholecystectomy   Past Medical History:  Diagnosis Date   CLL (chronic lymphocytic leukemia) ([place])    Constipation    Depression    Diabetes mellitus without complication (HCC)    GERD (gastroesophageal reflux disease)    Hematuria    Hyperlipidemia    Hypertension    Therapeutic opioid induced constipation     Patient Active Problem List   Diagnosis Date Noted   Sepsis ([place]) 11/30/2020   Adrenal insufficiency due to cancer therapy ([place])    Protein-calorie malnutrition, severe 11/23/2020   Symptomatic anemia 11/21/2020   Hypokalemia 11/21/2020   Therapeutic opioid-induced constipation (OIC) 09/28/2020   Non-traumatic compression fracture of T2 thoracic vertebra, sequela 04/17/2020   Non-traumatic compression  fracture of T1 thoracic vertebra, sequela 04/17/2020   Non-traumatic compression fracture of T3 thoracic vertebra, sequela 04/17/2020   Chronic neuropathic pain 04/11/2020   Non compliance w medication regimen 03/09/2020   History of high risk medication treatment 03/02/2020   Medication care plan discussed with patient 03/02/2020   Pain medication agreement discussed 03/02/2020   Pain medication agreement signed 03/02/2020   Opiate use 03/02/2020   Physical tolerance to opiate drug 03/02/2020   Encounter for adjustment and management of infusion pump 02/29/2020   Presence of implanted infusion pump (Medtronic) 02/15/2020   Presence of intrathecal pump (Medtronic) 02/15/2020   DDD (degenerative disc disease), thoracic 07/12/2019   DDD (degenerative disc disease), lumbosacral 07/12/2019   Lumbar facet hypertrophy ([place]) 07/12/2019   Cancer-related pain 07/12/2019   Elevated sed rate 07/12/2019   Abdominal wall pain in epigastric region 06/15/2019   Thoracic radiculitis 06/15/2019   Right-sided low back pain without sciatica 06/15/2019   Bradycardia, unspecified 06/01/2019   Hypotension 06/01/2019   Chronic anticoagulation (Plavix) 05/19/2019   Vitamin D deficiency 05/19/2019   Chronic pain syndrome 05/03/2019   Pharmacologic therapy 05/03/2019   Disorder of skeletal system 05/03/2019   Problems influencing health status 05/03/2019   Chronic abdominal pain 05/03/2019   Elevated uric acid in blood 05/03/2019   Chronic prescription opiate use 05/03/2019   Enlarged prostate 16/01/9603   Folic acid deficiency 54/12/8117   Major depressive disorder, recurrent, mild ([place]) 03/08/2019   Shortness of breath 12/28/2018   Acute on chronic renal failure ([place]) 12/28/2018   SOB (  shortness of breath) 12/28/2018   Portal hypertensive gastropathy ([place]) 09/25/2018   CKD stage 3 secondary to diabetes ([place]) 08/04/2018   Atherosclerosis of native arteries of the extremities with ulceration  ([place]) 08/04/2018   Stage 3 chronic kidney disease ([place]) 08/04/2018   Anemia due to stage 3 chronic kidney disease ([place]) 04/15/2018   Gastroesophageal reflux disease without esophagitis 08/22/2017   Chronic epigastric pain (1ry area of Pain) 08/21/2017   Hx of adenomatous colonic polyps 08/21/2017   Atherosclerosis of native arteries of extremity with rest pain ([place]) 05/13/2017   Hyperlipidemia 05/06/2017   Atherosclerotic peripheral vascular disease with intermittent claudication ([place]) 05/06/2017   Toe cyanosis 05/06/2017   AVM (arteriovenous malformation) of duodenum, acquired with hemorrhage 12/12/2016   Thrombocytopenia ([place]) 12/12/2016   Anemia 10/16/2016   Current smoker 10/16/2016   Malignant lymphoma, small lymphocytic ([place]) 03/25/2016   Generalized abdominal pain 12/15/2015   CLL (chronic lymphocytic leukemia) ([place]) 12/26/2013   Diabetes mellitus type 2, uncomplicated ([place]) 85/46/2703   Hypertension 12/26/2013    Past Surgical History:  Procedure Laterality Date   CATARACT EXTRACTION W/ INTRAOCULAR LENS IMPLANT Bilateral    CHOLECYSTECTOMY  1983   COLONOSCOPY WITH PROPOFOL N/A 10/27/2017   Procedure: COLONOSCOPY WITH PROPOFOL;  Surgeon: Manya Silvas, MD;  Location: [facility] ENDOSCOPY;  Service: Endoscopy;  Laterality: N/A;   ESOPHAGOGASTRODUODENOSCOPY (EGD) WITH PROPOFOL N/A 11/20/2016   Procedure: ESOPHAGOGASTRODUODENOSCOPY (EGD) WITH PROPOFOL;  Surgeon: Manya Silvas, MD;  Location: [facility] ENDOSCOPY;  Service: Endoscopy;  Laterality: N/A;   ESOPHAGOGASTRODUODENOSCOPY (EGD) WITH PROPOFOL N/A 10/27/2017   Procedure: ESOPHAGOGASTRODUODENOSCOPY (EGD) WITH PROPOFOL;  Surgeon: Manya Silvas, MD;  Location: [facility] ENDOSCOPY;  Service: Endoscopy;  Laterality: N/A;   ESOPHAGOGASTRODUODENOSCOPY (EGD) WITH PROPOFOL N/A 11/23/2020   Procedure: ESOPHAGOGASTRODUODENOSCOPY (EGD) WITH PROPOFOL;  Surgeon: Jonathon Bellows, MD;  Location: [facility] ENDOSCOPY;  Service: Gastroenterology;  Laterality:  N/A;   INTRATHECAL PUMP IMPLANT Left 02/07/2020   Procedure: INTRATHECAL PUMP & CATHETER IMPLANT;  Surgeon: Deetta Perla, MD;  Location: [facility] ORS;  Service: Neurosurgery;  Laterality: Left;   LOWER EXTREMITY ANGIOGRAPHY Right 05/19/2017   Procedure: LOWER EXTREMITY ANGIOGRAPHY;  Surgeon: Algernon Huxley, MD;  Location: [place] CV LAB;  Service: Cardiovascular;  Laterality: Right;   LOWER EXTREMITY ANGIOGRAPHY Left 08/10/2018   Procedure: LOWER EXTREMITY ANGIOGRAPHY;  Surgeon: Algernon Huxley, MD;  Location: [place] CV LAB;  Service: Cardiovascular;  Laterality: Left;    Prior to Admission medications   Medication Sig Start Date End Date Taking? Authorizing Provider  folic acid (FOLVITE) 1 MG tablet Take 1 tablet (1 mg total) by mouth daily. 11/09/19  Yes Cammie Sickle, MD  gabapentin (NEURONTIN) 300 MG capsule Take 3 capsules (900 mg total) by mouth at bedtime AND 1 capsule (300 mg total) 2 (two) times daily. 07/31/20 11/30/20 Yes Milinda Pointer, MD  hydrocortisone (CORTEF) 10 MG tablet Take 3 tablets (30 mg total) by mouth daily. 11/25/20  Yes [name], MD  hydrocortisone (CORTEF) 20 MG tablet Take 1 tablet (20 mg total) by mouth every evening. 11/25/20  Yes [name], MD  ibrutinib 420 MG TABS Take 420 mg by mouth daily. 08/31/20  Yes Cammie Sickle, MD  metFORMIN (GLUCOPHAGE) 1000 MG tablet Take 1 tablet (1,000 mg total) by mouth daily with breakfast. 10/04/20  Yes Burns, Wandra Feinstein, NP  Multiple Vitamin (MULTIVITAMIN WITH MINERALS) TABS tablet Take 1 tablet by mouth daily. 11/25/20  Yes [name], MD  naloxegol oxalate (MOVANTIK) 25 MG TABS tablet  Take 1 tablet (25 mg total) by mouth daily. 09/28/20 12/27/20 Yes Milinda Pointer, MD  pantoprazole (PROTONIX) 40 MG tablet Take 40 mg by mouth at bedtime. 12/24/19  Yes [provider]  AMBULATORY NON FORMULARY MEDICATION Medication Name: Intrathecal pump Bupivacaine 20.0 mg/ml Fentanyl 2,000.0 mcg/ml Dose 1846.4  mcg/day    [provider]  Ensure (ENSURE) Take 237 mLs by mouth 3 (three) times daily between meals.     [provider]  naloxone [facility]) 2 MG/2ML injection Inject 1 mL (1 mg total) into the muscle as needed for up to 2 doses (for opioid overdose). In case of emergency (overdose), inject into muscle of upper arm or leg and call 911. 03/02/20   Milinda Pointer, MD  Oxycodone HCl 10 MG TABS Take 1 tablet (10 mg total) by mouth every 4 (four) hours as needed. Must last 30 days 12/08/20 01/07/21  Milinda Pointer, MD  Oxycodone HCl 10 MG TABS Take 1 tablet (10 mg total) by mouth every 4 (four) hours as needed. Must last 30 days 01/07/21 02/06/21  Milinda Pointer, MD    Allergies Patient has no known allergies.  Family History  Problem Relation Age of Onset   Hypertension Sister     Social History Social History   Tobacco Use   Smoking status: Every Day    Packs/day: 0.50    Years: 47.00    Pack years: 23.50    Types: Cigarettes   Smokeless tobacco: Never  Vaping Use   Vaping Use: Never used  Substance Use Topics   Alcohol use: Yes    Alcohol/week: 1.0 standard drink    Types: 1 Glasses of wine per week    Comment:  almost none in last 6 months   Drug use: No    Review of Systems Constitutional: No fever/chills but reports fatigue felt slightly confused earlier today Eyes: No visual changes. ENT: No sore throat. Cardiovascular: Denies chest pain. Respiratory: Denies shortness of breath.  Slight cough. Gastrointestinal: No abdominal pain except in his mid upper abdomen which she reports is actually been present for 4 or more years but will come and go with waves of pain off and on, recently did have bleeding in his stomach as well while in hospital but no further.   Genitourinary: Negative for dysuria. Musculoskeletal: Negative for back pain. Skin: Negative for rash. Neurological: Negative for headaches, areas of focal weakness or  numbness.    ____________________________________________   PHYSICAL EXAM:  VITAL SIGNS: ED Triage Vitals  Enc Vitals Group     BP 11/30/20 1108 123/79     Pulse Rate 11/30/20 1108 (!) 119     Resp 11/30/20 1108 14     Temp 11/30/20 1108 99.3 F (37.4 C)     Temp Source 11/30/20 1108 Oral     SpO2 11/30/20 1108 97 %     Weight 11/30/20 1115 130 lb 1.1 oz (59 kg)     Height 11/30/20 1115 5\' 8"  (1.727 m)     Head Circumference --      Peak Flow --      Pain Score 11/30/20 1115 8     Pain Loc --      Pain Edu? --      Excl. in [place]? --     Constitutional: Alert and oriented. Well appearing and in no acute distress.  Appears chronically ill but in no distress.  Is fatigued. Eyes: Conjunctivae are normal. Head: Atraumatic. Nose: No congestion/rhinnorhea. Mouth/Throat: Mucous membranes  are slightly dry. Neck: No stridor.  Cardiovascular: Normal rate, regular rhythm. Grossly normal heart sounds.  Good peripheral circulation. Respiratory: Normal respiratory effort.  No retractions. Lungs CTAB except slightly diminished in the bases perhaps slightly more in the left lower. Gastrointestinal: Soft and nontender except across the epigastrium and left upper quadrant where he reports moderate pain. No distention. Musculoskeletal: No lower extremity tenderness nor edema. Neurologic:  Normal speech and language. No gross focal neurologic deficits are appreciated.  Skin:  Skin is warm, dry and intact. No rash noted. Psychiatric: Mood and affect are normal. Speech and behavior are normal.  ____________________________________________   LABS (all labs ordered are listed, but only abnormal results are displayed)  Labs Reviewed  BASIC METABOLIC PANEL - Abnormal; Notable for the following components:      Result Value   Glucose, Bld 149 (*)    Calcium 8.5 (*)    All other components within normal limits  CBC - Abnormal; Notable for the following components:   WBC 16.0 (*)    RBC 2.66  (*)    Hemoglobin 9.1 (*)    HCT 27.2 (*)    MCV 102.3 (*)    MCH 34.2 (*)    RDW 21.7 (*)    Platelets 80 (*)    All other components within normal limits  LACTIC ACID, PLASMA - Abnormal; Notable for the following components:   Lactic Acid, Venous 2.6 (*)    All other components within normal limits  PROTIME-INR - Abnormal; Notable for the following components:   Prothrombin Time 17.2 (*)    INR 1.4 (*)    All other components within normal limits  HEPATIC FUNCTION PANEL - Abnormal; Notable for the following components:   Total Protein 6.3 (*)    Total Bilirubin 2.6 (*)    Bilirubin, Direct 0.9 (*)    Indirect Bilirubin 1.7 (*)    All other components within normal limits  RESP PANEL BY RT-PCR (FLU A&B, COVID) ARPGX2  CULTURE, BLOOD (ROUTINE X 2)  CULTURE, BLOOD (ROUTINE X 2)  URINE CULTURE  APTT  LIPASE, BLOOD  URINALYSIS, COMPLETE (UACMP) WITH MICROSCOPIC  HEMOGLOBIN A1C  LACTIC ACID, PLASMA  CBG MONITORING, ED   ____________________________________________  EKG  Reviewed inter by me at 1259 heart rate 120 QRS 89 QTc 430 Sinus tachycardia, ST segment abnormalities suggestive of possible lateral ischemia ____________________________________________  RADIOLOGY  CT ABDOMEN PELVIS W CONTRAST  Result Date: 11/30/2020 CLINICAL DATA:  Epigastric pain for 5 days.  Generalized weakness EXAM: CT ABDOMEN AND PELVIS WITH CONTRAST TECHNIQUE: Multidetector CT imaging of the abdomen and pelvis was performed using the standard protocol following bolus administration of intravenous contrast. CONTRAST:  19mL OMNIPAQUE IOHEXOL 350 MG/ML SOLN COMPARISON:  11/21/2020 FINDINGS: Lower chest: New mixed density airspace opacity within the left lower lobe. Right lung bases clear. Heart size is normal. Hepatobiliary: Status post cholecystectomy. Similar degree of intrahepatic biliary dilatation. No focal liver lesion is identified. Pancreas: Unremarkable. No pancreatic ductal dilatation or  surrounding inflammatory changes. Spleen: Spleen is upper limits of normal in size without focal lesion. Adrenals/Urinary Tract: Unremarkable adrenal glands. Stable bilateral renal cysts. Two tiny right-sided renal stones without hydronephrosis. No left-sided renal stone or hydronephrosis. Urinary bladder is mildly distended but appears otherwise unremarkable. Stomach/Bowel: Stomach is within normal limits. Appendix appears normal. No evidence of bowel wall thickening, distention, or inflammatory changes. Vascular/Lymphatic: Advanced atherosclerosis throughout the aortoiliac axis with bi-iliac stents. No aneurysm. Similar degree of retroperitoneal lymphadenopathy. Reference left para-aortic  nodal mass measuring approximately 4.3 x 2.4 cm (series 3, image 36), unchanged. Reproductive: Prostatomegaly. Other: No free fluid. No abdominopelvic fluid collection. No pneumoperitoneum. No abdominal wall hernia. Musculoskeletal: No acute or significant osseous findings. Neurostimulator is again seen entering at the L3-4 level and extending into the thoracic region, beyond the field of view. IMPRESSION: 1. New airspace opacity within the left lower lobe compatible with pneumonia. 2. Stable retroperitoneal lymphadenopathy. 3. Nonobstructing right nephrolithiasis. 4. Prostatomegaly. 5. Aortic atherosclerosis (ICD10-I70.0). Electronically Signed   By: Davina Poke D.O.   On: 11/30/2020 15:31   DG Chest Port 1 View  Result Date: 11/30/2020 CLINICAL DATA:  Questionable sepsis. EXAM: PORTABLE CHEST 1 VIEW COMPARISON:  Chest x-ray 11/21/2020, CT chest 01/24/2020 FINDINGS: The heart size and mediastinal contours are unchanged. Aortic calcification. Interval development of patchy airspace opacity of the left lower lobe. No pulmonary edema. No pleural effusion. No pneumothorax. No acute osseous abnormality. Degenerative changes of bilateral acromioclavicular joints. IMPRESSION: Left lower lobe pneumonia/aspiration pneumonia  Followup PA and lateral chest X-ray is recommended in 3-4 weeks following therapy to ensure resolution and exclude underlying malignancy. Electronically Signed   By: Iven Finn M.D.   On: 11/30/2020 15:34     Imaging studies reviewed chest x-ray notable for left lower lobe pneumonia.  Additionally, CT scan abdomen pelvis reviewed notable for left lower lobe pneumonia.  Right renal nephrolithiasis.  Enlarged prostate. ____________________________________________   PROCEDURES  Procedure(s) performed: None  Procedures  Critical Care performed: Yes, see critical care note(s)  CRITICAL CARE Performed by: Delman Kitten   Total critical care time: 30 minutes  Critical care time was exclusive of separately billable procedures and treating other patients.  Critical care was necessary to treat or prevent imminent or life-threatening deterioration.  Critical care was time spent personally by me on the following activities: development of treatment plan with patient and/or surrogate as well as nursing, discussions with consultants, evaluation of patient's response to treatment, examination of patient, obtaining history from patient or surrogate, ordering and performing treatments and interventions, ordering and review of laboratory studies, ordering and review of radiographic studies, pulse oximetry and re-evaluation of patient's condition.  ____________________________________________   INITIAL IMPRESSION / ASSESSMENT AND PLAN / ED COURSE  Pertinent labs & imaging results that were available during my care of the patient were reviewed by me and considered in my medical decision making (see chart for details).   Patient presents with high suspicion for possible sepsis.  Tachycardic on arrival with heart rate 120, in addition elevated white count reports new urinary incontinence also epigastric pain though this seems to be quite chronic in nature, as well as a cough.  Had recent inpatient stay  and operative procedure including endoscopy.  On review of chest x-ray it appears he has pneumonia, this may certainly represent healthcare associated pneumonia and/or aspiration.  Minimally elevated lactic acid.  Patient's vital signs heart rate improving after fluids.  He was initially noted to have some slight confusion but I do not find this to be the case on my evaluation.  He denies any acute central neurologic symptoms or headache.  He does meet sepsis criteria, broad-spectrum antibiotics ordered.  No evidence to suggest septic shock however.  ----------------------------------------- 4:16 PM on 11/30/2020 ----------------------------------------- Case and care discussed with Dr. Francine Graven, will admit the patient for further care and management.  Discussed with the patient is resting comfortably at this time does report he started to feel somewhat improved and he is alert nontoxic  now with normal vital signs.  Patient being admitted for further care and management for concerns of pneumonia, urinalysis however still pending    EKG does demonstrate some abnormality  ____________________________________________   FINAL CLINICAL IMPRESSION(S) / ED DIAGNOSES  Final diagnoses:  HCAP (healthcare-associated pneumonia)  Sepsis, due to unspecified organism, unspecified whether acute organ dysfunction present [facility])        Note:  This document was prepared using Dragon voice recognition software and may include unintentional dictation errors       Delman Kitten, MD 11/30/20 1642

## 2020-11-30 NOTE — Progress Notes (Signed)
PHARMACY -  BRIEF ANTIBIOTIC NOTE   Pharmacy has received consult(s) for Vancomycin and Cefepime from an ED provider.  The patient's profile has been reviewed for ht/wt/allergies/indication/available labs.    One time order(s) placed for  Cefepime 2gm Vancomycin 1000mg   Further antibiotics/pharmacy consults should be ordered by admitting physician if indicated.                       Thank you, Harlowe Dowler Rodriguez-Guzman PharmD, BCPS 11/30/2020 3:43 PM

## 2020-11-30 NOTE — ED Notes (Signed)
Patient transported to inpatient unit by RN.

## 2020-11-30 NOTE — Sepsis Progress Note (Signed)
Notified bedside nurse of need to draw repeat lactic acid. 

## 2020-11-30 NOTE — ED Notes (Signed)
Patient transported to CT 

## 2020-11-30 NOTE — ED Notes (Signed)
Patient is resting comfortably. Patient updated on POC.

## 2020-11-30 NOTE — Progress Notes (Signed)
CODE SEPSIS - PHARMACY COMMUNICATION  **Broad Spectrum Antibiotics should be administered within 1 hour of Sepsis diagnosis**  Time Code Sepsis Called/Page Received: 1542  Antibiotics Ordered:  Cefepime Vancomycin  Time of 1st antibiotic administration: 1608  Anali Cabanilla Rodriguez-Guzman PharmD, BCPS 11/30/2020 4:48 PM

## 2020-11-30 NOTE — ED Notes (Signed)
Dr. Jacqualine Code at bedside. Patient ok to have ice chips per Dr. Jacqualine Code, ice chips provided.

## 2020-11-30 NOTE — Progress Notes (Signed)
Pt in restroom, advised pt that urine sample is needed to use urinal, pt agreed, a&ox4, denies pain at present, pt requesting something to drink, given water and diet [place], iv fluids infusing without difficulty to LAC, visitor at bedside, pt advised of  ibrutinib being nonformulary for hospital and visitor stated he would bring tomorrow, pt agreed, call light within reach, bed low and locked, will continue to monitor

## 2020-12-01 ENCOUNTER — Inpatient Hospital Stay: Payer: Medicare Other

## 2020-12-01 ENCOUNTER — Inpatient Hospital Stay: Payer: Medicare Other | Admitting: Internal Medicine

## 2020-12-01 DIAGNOSIS — C9110 Chronic lymphocytic leukemia of B-cell type not having achieved remission: Secondary | ICD-10-CM | POA: Diagnosis not present

## 2020-12-01 DIAGNOSIS — R109 Unspecified abdominal pain: Secondary | ICD-10-CM | POA: Diagnosis not present

## 2020-12-01 DIAGNOSIS — E273 Drug-induced adrenocortical insufficiency: Secondary | ICD-10-CM | POA: Diagnosis not present

## 2020-12-01 DIAGNOSIS — G8929 Other chronic pain: Secondary | ICD-10-CM

## 2020-12-01 DIAGNOSIS — J181 Lobar pneumonia, unspecified organism: Secondary | ICD-10-CM | POA: Diagnosis not present

## 2020-12-01 DIAGNOSIS — D638 Anemia in other chronic diseases classified elsewhere: Secondary | ICD-10-CM

## 2020-12-01 DIAGNOSIS — A419 Sepsis, unspecified organism: Secondary | ICD-10-CM | POA: Diagnosis not present

## 2020-12-01 DIAGNOSIS — R7881 Bacteremia: Secondary | ICD-10-CM | POA: Diagnosis not present

## 2020-12-01 LAB — BLOOD CULTURE ID PANEL (REFLEXED) - BCID2

## 2020-12-01 LAB — URINALYSIS, COMPLETE (UACMP) WITH MICROSCOPIC
Bacteria, UA: NONE SEEN
Bilirubin Urine: NEGATIVE
Glucose, UA: NEGATIVE mg/dL
Hgb urine dipstick: NEGATIVE
Ketones, ur: 5 mg/dL — AB
Leukocytes,Ua: NEGATIVE
Nitrite: NEGATIVE
Protein, ur: NEGATIVE mg/dL
Specific Gravity, Urine: 1.045 — ABNORMAL HIGH (ref 1.005–1.030)
pH: 5 (ref 5.0–8.0)

## 2020-12-01 LAB — BASIC METABOLIC PANEL
Anion gap: 6 (ref 5–15)
BUN: 27 mg/dL — ABNORMAL HIGH (ref 8–23)
CO2: 27 mmol/L (ref 22–32)
Calcium: 8 mg/dL — ABNORMAL LOW (ref 8.9–10.3)
Chloride: 106 mmol/L (ref 98–111)
Creatinine, Ser: 1.09 mg/dL (ref 0.61–1.24)
GFR, Estimated: >60 mL/min (ref >60)
Glucose, Bld: 150 mg/dL — ABNORMAL HIGH (ref 70–99)
Potassium: 3.7 mmol/L (ref 3.5–5.1)
Sodium: 139 mmol/L (ref 135–145)

## 2020-12-01 LAB — CORTISOL-AM, BLOOD: Cortisol - AM: 46.6 ug/dL — ABNORMAL HIGH (ref 6.7–22.6)

## 2020-12-01 LAB — CBC
HCT: 22.3 % — ABNORMAL LOW (ref 39.0–52.0)
Hemoglobin: 7.3 g/dL — ABNORMAL LOW (ref 13.0–17.0)
MCH: 33.5 pg (ref 26.0–34.0)
MCHC: 32.7 g/dL (ref 30.0–36.0)
MCV: 102.3 fL — ABNORMAL HIGH (ref 80.0–100.0)
Platelets: 59 10*3/uL — ABNORMAL LOW (ref 150–400)
RBC: 2.18 MIL/uL — ABNORMAL LOW (ref 4.22–5.81)
RDW: 21.4 % — ABNORMAL HIGH (ref 11.5–15.5)
WBC: 10.4 10*3/uL (ref 4.0–10.5)
nRBC: 0 % (ref 0.0–0.2)

## 2020-12-01 LAB — PROCALCITONIN: Procalcitonin: 24.94 ng/mL

## 2020-12-01 LAB — GLUCOSE, CAPILLARY
Glucose-Capillary: 125 mg/dL — ABNORMAL HIGH (ref 70–99)
Glucose-Capillary: 130 mg/dL — ABNORMAL HIGH (ref 70–99)
Glucose-Capillary: 114 mg/dL — ABNORMAL HIGH (ref 70–99)
Glucose-Capillary: 155 mg/dL — ABNORMAL HIGH (ref 70–99)

## 2020-12-01 LAB — MRSA NEXT GEN BY PCR, NASAL: MRSA by PCR Next Gen: NOT DETECTED

## 2020-12-01 LAB — PROTIME-INR
INR: 1.7 — ABNORMAL HIGH (ref 0.8–1.2)
Prothrombin Time: 20.4 seconds — ABNORMAL HIGH (ref 11.4–15.2)

## 2020-12-01 MED ORDER — SODIUM CHLORIDE 0.9 % IV SOLN
2.0000 g | INTRAVENOUS | Status: DC
  Administered 2020-12-01 – 2020-12-03 (×3): 2 g via INTRAVENOUS
  Filled 2020-12-01 (×2): qty 2
  Filled 2020-12-01 (×2): qty 20

## 2020-12-01 MED ORDER — AZITHROMYCIN 250 MG PO TABS
500.0000 mg | ORAL_TABLET | Freq: Every day | ORAL | Status: AC
  Administered 2020-12-01 – 2020-12-05 (×5): 500 mg via ORAL
  Filled 2020-12-01 (×6): qty 2

## 2020-12-01 MED ORDER — HYDROCORTISONE NA SUCCINATE PF 100 MG IJ SOLR
50.0000 mg | Freq: Two times a day (BID) | INTRAMUSCULAR | Status: DC
  Administered 2020-12-01 – 2020-12-04 (×7): 50 mg via INTRAVENOUS
  Filled 2020-12-01 (×8): qty 1

## 2020-12-01 MED ORDER — BOOST / RESOURCE BREEZE PO LIQD CUSTOM
1.0000 | Freq: Three times a day (TID) | ORAL | Status: DC
  Administered 2020-12-02 – 2020-12-04 (×6): 1 via ORAL

## 2020-12-01 MED ORDER — KATE FARMS STANDARD 1.4 PO LIQD
325.0000 mL | Freq: Three times a day (TID) | ORAL | Status: DC
  Administered 2020-12-01 – 2020-12-04 (×5): 325 mL via ORAL
  Filled 2020-12-01: qty 325

## 2020-12-01 NOTE — Progress Notes (Signed)
PHARMACY - PHYSICIAN COMMUNICATION CRITICAL VALUE ALERT - BLOOD CULTURE IDENTIFICATION (BCID)  James [name] is an 70 y.o. male who presented to [facility] on 11/30/2020 with a chief complaint of fever and weakness  Assessment:  8/4 blood culture (single set), both bottles with GPC, BCID = MSSE.  On antibiotics for possible PNA.   Name of physician (or Provider) Contacted: Dr Leslye Peer  Current antibiotics: vancomycin and meropenem  Changes to prescribed antibiotics recommended:  Recommendations accepted by provider, antibiotics to ceftriaxone and azithromycin.  ID to see patient  Results for orders placed or performed during the hospital encounter of 11/30/20  Blood Culture ID Panel (Reflexed) (Collected: 11/30/2020  2:32 PM)  Result Value Ref Range   Enterococcus faecalis NOT DETECTED NOT DETECTED   Enterococcus Faecium NOT DETECTED NOT DETECTED   Listeria monocytogenes NOT DETECTED NOT DETECTED   Staphylococcus species DETECTED (A) NOT DETECTED   Staphylococcus aureus (BCID) NOT DETECTED NOT DETECTED   Staphylococcus epidermidis DETECTED (A) NOT DETECTED   Staphylococcus lugdunensis NOT DETECTED NOT DETECTED   Streptococcus species NOT DETECTED NOT DETECTED   Streptococcus agalactiae NOT DETECTED NOT DETECTED   Streptococcus pneumoniae NOT DETECTED NOT DETECTED   Streptococcus pyogenes NOT DETECTED NOT DETECTED   A.calcoaceticus-baumannii NOT DETECTED NOT DETECTED   Bacteroides fragilis NOT DETECTED NOT DETECTED   Enterobacterales NOT DETECTED NOT DETECTED   Enterobacter cloacae complex NOT DETECTED NOT DETECTED   Escherichia coli NOT DETECTED NOT DETECTED   Klebsiella aerogenes NOT DETECTED NOT DETECTED   Klebsiella oxytoca NOT DETECTED NOT DETECTED   Klebsiella pneumoniae NOT DETECTED NOT DETECTED   Proteus species NOT DETECTED NOT DETECTED   Salmonella species NOT DETECTED NOT DETECTED   Serratia marcescens NOT DETECTED NOT DETECTED   Haemophilus influenzae NOT DETECTED NOT  DETECTED   Neisseria meningitidis NOT DETECTED NOT DETECTED   Pseudomonas aeruginosa NOT DETECTED NOT DETECTED   Stenotrophomonas maltophilia NOT DETECTED NOT DETECTED   Candida albicans NOT DETECTED NOT DETECTED   Candida auris NOT DETECTED NOT DETECTED   Candida glabrata NOT DETECTED NOT DETECTED   Candida krusei NOT DETECTED NOT DETECTED   Candida parapsilosis NOT DETECTED NOT DETECTED   Candida tropicalis NOT DETECTED NOT DETECTED   Cryptococcus neoformans/gattii NOT DETECTED NOT DETECTED   Methicillin resistance mecA/C NOT DETECTED NOT DETECTED   Doreene Eland, PharmD, BCPS.   Work Cell: [id number] 12/01/2020 11:21 AM

## 2020-12-01 NOTE — Progress Notes (Addendum)
[name], male   DOB: 1951-04-10, 70 y.o.   MRN: [id number] [facility] PROGRESS NOTE  KHALEN STYER WVP:[id number] DOB: 1950/10/27 DOA: 11/30/2020 PCP: [name], MD  HPI/Subjective: Patient not feeling well at all.  Admitted yesterday with pneumonia and started on aggressive antibiotics.  Some cough.  Slight shortness of breath.  Feels weak.  Caregiver concerned about him living by himself.  Initial blood culture positive for staph epidermis.  Patient does have an intrathecal pain pump.  Objective: Vitals:   12/01/20 0409 12/01/20 0755  BP: 107/63 101/63  Pulse: 92 79  Resp: 18 16  Temp: 98.2 F (36.8 C) 98.8 F (37.1 C)  SpO2: 93% 97%    Intake/Output Summary (Last 24 hours) at 12/01/2020 1555 Last data filed at 12/01/2020 0656 Gross per 24 hour  Intake 938.73 ml  Output 200 ml  Net 738.73 ml   Filed Weights   11/30/20 1115  Weight: 59 kg    ROS: Review of Systems  Respiratory:  Positive for cough and shortness of breath.   Cardiovascular:  Negative for chest pain.  Gastrointestinal:  Negative for abdominal pain, nausea and vomiting.  Exam: Physical Exam HENT:     Head: Normocephalic.     Mouth/Throat:     Pharynx: No oropharyngeal exudate.  Eyes:     General: Lids are normal.     Conjunctiva/sclera: Conjunctivae normal.     Pupils: Pupils are equal, round, and reactive to light.  Cardiovascular:     Rate and Rhythm: Normal rate and regular rhythm.     Heart sounds: Normal heart sounds, S1 normal and S2 normal.  Pulmonary:     Breath sounds: Examination of the right-lower field reveals decreased breath sounds and rhonchi. Examination of the left-lower field reveals decreased breath sounds and rhonchi. Decreased breath sounds and rhonchi present. No wheezing or rales.  Abdominal:     Palpations: Abdomen is soft.     Tenderness: There is no abdominal tenderness.  Musculoskeletal:     Right lower leg: No swelling.     Left lower  leg: No swelling.  Skin:    General: Skin is warm.     Findings: No rash.  Neurological:     Mental Status: He is alert and oriented to person, place, and time.     Data Reviewed: Basic Metabolic Panel: Recent Labs  Lab 11/30/20 1133 12/01/20 0443  NA 144 139  K 4.0 3.7  CL 108 106  CO2 26 27  GLUCOSE 149* 150*  BUN 22 27*  CREATININE 1.23 1.09  CALCIUM 8.5* 8.0*   Liver Function Tests: Recent Labs  Lab 11/30/20 1432  AST 21  ALT 12  ALKPHOS 71  BILITOT 2.6*  PROT 6.3*  ALBUMIN 3.7   Recent Labs  Lab 11/30/20 1432  LIPASE 20   CBC: Recent Labs  Lab 11/24/20 2344 11/25/20 0423 11/30/20 1133 12/01/20 0443  WBC  --   --  16.0* 10.4  HGB 8.7* 8.7* 9.1* 7.3*  HCT 25.4*  --  27.2* 22.3*  MCV  --   --  102.3* 102.3*  PLT  --   --  80* 59*     CBG: Recent Labs  Lab 11/25/20 0751 11/30/20 1809 11/30/20 2115 12/01/20 0756 12/01/20 1157  GLUCAP 107* 181* 242* 125* 130*    Recent Results (from the past 240 hour(s))  Blood Culture (routine x 2)     Status: None (Preliminary result)   Collection Time:  11/30/20  2:32 PM   Specimen: BLOOD  Result Value Ref Range Status   Specimen Description BLOOD LEFT ANTECUBITAL  Final   Special Requests   Final    BOTTLES DRAWN AEROBIC AND ANAEROBIC Blood Culture adequate volume   Culture  Setup Time   Final    Organism ID to follow GRAM POSITIVE COCCI IN BOTH AEROBIC AND ANAEROBIC BOTTLES CRITICAL RESULT CALLED TO, READ BACK BY AND VERIFIED WITH: CHARLES SHANLEVER AT 1010 12/01/20.PMF Performed at [facility], [address] [place], [place] [address]    Culture [facility] POSITIVE COCCI  Final   Report Status PENDING  Incomplete  Resp Panel by RT-PCR (Flu A&B, Covid) Nasopharyngeal Swab     Status: None   Collection Time: 11/30/20  2:32 PM   Specimen: Nasopharyngeal Swab; Nasopharyngeal(NP) swabs in vial transport medium  Result Value Ref Range Status   SARS Coronavirus 2 by RT PCR NEGATIVE NEGATIVE  Final    Comment: (NOTE) SARS-CoV-2 target nucleic acids are NOT DETECTED.  The SARS-CoV-2 RNA is generally detectable in upper respiratory specimens during the acute phase of infection. The lowest concentration of SARS-CoV-2 viral copies this assay can detect is 138 copies/mL. A negative result does not preclude SARS-Cov-2 infection and should not be used as the sole basis for treatment or other patient management decisions. A negative result may occur with  improper specimen collection/handling, submission of specimen other than nasopharyngeal swab, presence of viral mutation(s) within the areas targeted by this assay, and inadequate number of viral copies(<138 copies/mL). A negative result must be combined with clinical observations, patient history, and epidemiological information. The expected result is Negative.  Fact Sheet for Patients:  EntrepreneurPulse.com.au  Fact Sheet for Healthcare Providers:  IncredibleEmployment.be  This test is no t yet approved or cleared by the [place] FDA and  has been authorized for detection and/or diagnosis of SARS-CoV-2 by FDA under an Emergency Use Authorization (EUA). This EUA will remain  in effect (meaning this test can be used) for the duration of the COVID-19 declaration under Section 564(b)(1) of the Act, 21 U.S.C.section 360bbb-3(b)(1), unless the authorization is terminated  or revoked sooner.       Influenza A by PCR NEGATIVE NEGATIVE Final   Influenza B by PCR NEGATIVE NEGATIVE Final    Comment: (NOTE) The Xpert Xpress SARS-CoV-2/FLU/RSV plus assay is intended as an aid in the diagnosis of influenza from Nasopharyngeal swab specimens and should not be used as a sole basis for treatment. Nasal washings and aspirates are unacceptable for Xpert Xpress SARS-CoV-2/FLU/RSV testing.  Fact Sheet for Patients: EntrepreneurPulse.com.au  Fact Sheet for Healthcare  Providers: IncredibleEmployment.be  This test is not yet approved or cleared by the [place] FDA and has been authorized for detection and/or diagnosis of SARS-CoV-2 by FDA under an Emergency Use Authorization (EUA). This EUA will remain in effect (meaning this test can be used) for the duration of the COVID-19 declaration under Section 564(b)(1) of the Act, 21 U.S.C. section 360bbb-3(b)(1), unless the authorization is terminated or revoked.  Performed at [facility], [address] [place], [place] [address]   Blood Culture ID Panel (Reflexed)     Status: Abnormal   Collection Time: 11/30/20  2:32 PM  Result Value Ref Range Status   Enterococcus faecalis NOT DETECTED NOT DETECTED Final   Enterococcus Faecium NOT DETECTED NOT DETECTED Final   Listeria monocytogenes NOT DETECTED NOT DETECTED Final   Staphylococcus species DETECTED (A) NOT DETECTED Final  Comment: CRITICAL RESULT CALLED TO, READ BACK BY AND VERIFIED WITH: [facility] AT 1010 12/01/20.PMF    Staphylococcus aureus (BCID) NOT DETECTED NOT DETECTED Final   Staphylococcus epidermidis DETECTED (A) NOT DETECTED Final    Comment: CRITICAL RESULT CALLED TO, READ BACK BY AND VERIFIED WITH: CHARLES SHANLEVER AT 1010 12/01/20.PMF    Staphylococcus lugdunensis NOT DETECTED NOT DETECTED Final   Streptococcus species NOT DETECTED NOT DETECTED Final   Streptococcus agalactiae NOT DETECTED NOT DETECTED Final   Streptococcus pneumoniae NOT DETECTED NOT DETECTED Final   Streptococcus pyogenes NOT DETECTED NOT DETECTED Final   A.calcoaceticus-baumannii NOT DETECTED NOT DETECTED Final   Bacteroides fragilis NOT DETECTED NOT DETECTED Final   Enterobacterales NOT DETECTED NOT DETECTED Final   Enterobacter cloacae complex NOT DETECTED NOT DETECTED Final   Escherichia coli NOT DETECTED NOT DETECTED Final   Klebsiella aerogenes NOT DETECTED NOT DETECTED Final   Klebsiella oxytoca NOT DETECTED NOT  DETECTED Final   Klebsiella pneumoniae NOT DETECTED NOT DETECTED Final   Proteus species NOT DETECTED NOT DETECTED Final   Salmonella species NOT DETECTED NOT DETECTED Final   Serratia marcescens NOT DETECTED NOT DETECTED Final   Haemophilus influenzae NOT DETECTED NOT DETECTED Final   Neisseria meningitidis NOT DETECTED NOT DETECTED Final   Pseudomonas aeruginosa NOT DETECTED NOT DETECTED Final   Stenotrophomonas maltophilia NOT DETECTED NOT DETECTED Final   Candida albicans NOT DETECTED NOT DETECTED Final   Candida auris NOT DETECTED NOT DETECTED Final   Candida glabrata NOT DETECTED NOT DETECTED Final   Candida krusei NOT DETECTED NOT DETECTED Final   Candida parapsilosis NOT DETECTED NOT DETECTED Final   Candida tropicalis NOT DETECTED NOT DETECTED Final   Cryptococcus neoformans/gattii NOT DETECTED NOT DETECTED Final   Methicillin resistance mecA/C NOT DETECTED NOT DETECTED Final    Comment: Performed at [facility], [address] [place], [place] [address]     Studies: CT ABDOMEN PELVIS W CONTRAST  Result Date: 11/30/2020 CLINICAL DATA:  Epigastric pain for 5 days.  Generalized weakness EXAM: CT ABDOMEN AND PELVIS WITH CONTRAST TECHNIQUE: Multidetector CT imaging of the abdomen and pelvis was performed using the standard protocol following bolus administration of intravenous contrast. CONTRAST:  62mL OMNIPAQUE IOHEXOL 350 MG/ML SOLN COMPARISON:  11/21/2020 FINDINGS: Lower chest: New mixed density airspace opacity within the left lower lobe. Right lung bases clear. Heart size is normal. Hepatobiliary: Status post cholecystectomy. Similar degree of intrahepatic biliary dilatation. No focal liver lesion is identified. Pancreas: Unremarkable. No pancreatic ductal dilatation or surrounding inflammatory changes. Spleen: Spleen is upper limits of normal in size without focal lesion. Adrenals/Urinary Tract: Unremarkable adrenal glands. Stable bilateral renal cysts. Two tiny  right-sided renal stones without hydronephrosis. No left-sided renal stone or hydronephrosis. Urinary bladder is mildly distended but appears otherwise unremarkable. Stomach/Bowel: Stomach is within normal limits. Appendix appears normal. No evidence of bowel wall thickening, distention, or inflammatory changes. Vascular/Lymphatic: Advanced atherosclerosis throughout the aortoiliac axis with bi-iliac stents. No aneurysm. Similar degree of retroperitoneal lymphadenopathy. Reference left para-aortic nodal mass measuring approximately 4.3 x 2.4 cm (series 3, image 36), unchanged. Reproductive: Prostatomegaly. Other: No free fluid. No abdominopelvic fluid collection. No pneumoperitoneum. No abdominal wall hernia. Musculoskeletal: No acute or significant osseous findings. Neurostimulator is again seen entering at the L3-4 level and extending into the thoracic region, beyond the field of view. IMPRESSION: 1. New airspace opacity within the left lower lobe compatible with pneumonia. 2. Stable retroperitoneal lymphadenopathy. 3. Nonobstructing right nephrolithiasis. 4. Prostatomegaly. 5. Aortic  atherosclerosis (ICD10-I70.0). Electronically Signed   By: Davina Poke D.O.   On: 11/30/2020 15:31   DG Chest Port 1 View  Result Date: 11/30/2020 CLINICAL DATA:  Questionable sepsis. EXAM: PORTABLE CHEST 1 VIEW COMPARISON:  Chest x-ray 11/21/2020, CT chest 01/24/2020 FINDINGS: The heart size and mediastinal contours are unchanged. Aortic calcification. Interval development of patchy airspace opacity of the left lower lobe. No pulmonary edema. No pleural effusion. No pneumothorax. No acute osseous abnormality. Degenerative changes of bilateral acromioclavicular joints. IMPRESSION: Left lower lobe pneumonia/aspiration pneumonia Followup PA and lateral chest X-ray is recommended in 3-4 weeks following therapy to ensure resolution and exclude underlying malignancy. Electronically Signed   By: Iven Finn M.D.   On:  11/30/2020 15:34    Scheduled Meds:  azithromycin  500 mg Oral Daily   feeding supplement  1 Container Oral TID BM   feeding supplement (KATE FARMS STANDARD 1.4)  325 mL Oral TID BM   folic acid  1 mg Oral Daily   gabapentin  200 mg Oral BID   hydrocortisone sod succinate (SOLU-CORTEF) inj  50 mg Intravenous Q12H   ibrutinib  420 mg Oral Daily   insulin aspart  0-9 Units Subcutaneous TID WC   multivitamin with minerals  1 tablet Oral Daily   naloxegol oxalate  12.5 mg Oral Daily   nicotine  14 mg Transdermal Daily   pantoprazole  40 mg Oral QHS   Continuous Infusions:  cefTRIAXone (ROCEPHIN)  IV     Brief history: Patient admitted on 11/30/2020 with weakness and fever.  Past medical history of CLL on chemotherapy, opioid-induced constipation, chronic abdominal pain, adrenal insufficiency, anemia of chronic disease.  Patient was found to have clinical sepsis and left lower lobar pneumonia and started on antibiotics.  I changed antibiotics to Rocephin and Zithromax.  Blood culture grew out staph epidermidis.  I repeated blood cultures.  Since the patient has a chronic intrathecal pain pump I did get infectious disease specialist to see the patient.  I started stress dose steroids with his history of adrenal insufficiency.  Likely will end up needing a blood transfusion during the hospital course.  Assessment/Plan:  Clinical sepsis, present on admission with left lobar pneumonia.  Fever of 102.7, tachycardia, lactic acidosis and leukocytosis.  Patient was started on meropenem and vancomycin by admitting physician.  Case discussed with infectious disease specialist and pharmacist and will switch antibiotics to Rocephin and Zithromax. Staph epidermidis growing in 2 blood culture bottles.  Unfortunately they only sent 1 set from the ER.  Unsure if this is a real infection or contamination.  Repeat blood cultures.  Antibiotic should cover.  Since the patient does have an intrathecal pump I will get  infectious disease specialist to see patient. Chronic abdominal pain on intrathecal pump. Adrenal insufficiency switch Cortef over the hydrocortisone for right now. CLL on ibrutinib.  Case discussed with Dr. Rogue Bussing hematology Anemia of chronic disease with recent GI bleed (acute blood loss anemia) and cauterization of angiodysplasias.  Hemoglobin did come down with IV fluid hydration.  Discontinue IV fluid hydration.  Type and cross tomorrow morning.  If hemoglobin goes down further will need a transfusion.        Code Status:     Code Status Orders  (From admission, onward)           Start     Ordered   11/30/20 1617  Do not attempt resuscitation (DNR)  Continuous       Question Answer  Comment  In the event of cardiac or respiratory ARREST Do not call a "code blue"   In the event of cardiac or respiratory ARREST Do not perform Intubation, CPR, defibrillation or ACLS   In the event of cardiac or respiratory ARREST Use medication by any route, position, wound care, and other measures to relive pain and suffering. May use oxygen, suction and manual treatment of airway obstruction as needed for comfort.   Comments CODE STATUS was discussed with patient and he is a DNR      11/30/20 1623           Code Status History     Date Active Date Inactive Code Status Order ID Comments User Context   11/21/2020 1647 11/25/2020 1824 DNR [id number]  Collier Bullock, MD ED   12/28/2018 1824 12/30/2018 1909 DNR [id number]  Vaughan Basta, MD ED   08/10/2018 1126 08/10/2018 1625 Full Code [id number]  Algernon Huxley, MD Inpatient   05/19/2017 1435 05/19/2017 1903 Full Code [id number]  Algernon Huxley, MD Inpatient      Family Communication: Spoke with caregiver at the bedside and son on the phone Disposition Plan: Status is: Inpatient    Dispo: The patient is from: Home              Anticipated d/c is to: To be determined based on progress              Patient currently being treated for  clinical sepsis with IV antibiotics.   Difficult to place patient.  No.  Consultants: Oncology  Antibiotics: Rocephin and Zithromax  Time spent: 28 minutes  [place]

## 2020-12-01 NOTE — Consult Note (Signed)
NAME: James [name]  DOB: 1950/11/24  MRN: [id number]  Date/Time: 12/01/2020 1:43 PM  REQUESTING PROVIDER: Dr.Wieting Subjective:  REASON FOR CONSULT: bacteremia ?chart reviewed, some history from patient James [name] is a 70 y.o. male with a history of CLL, hypertension, diabetes mellitus, chronic pain syndrome, abdominal pump with intrathecal catheter placement in the lumbar area on 02/07/2020 for chronic abdominal pain Presents to the ED on 11/30/2020 with generalized weakness and fever of 2 days duration.  He has had abdominal pain for 5 years.  He was recently in the hospital between 726 until 11/25/2020 for weakness and falls and dizziness and found to have anemia and underwent EGD which showed angiodysplasia in the duodenum.  He was given blood transfusion and was discharged home  On 11/28/20 the the pain pump was accessed and the pain medicine was refilled in the pain management clinic.  Patient with history of CLL which was diagnosed in 2006.  He initially started Bendamustine and rituximab.  In 2050 there was progression and he was started on ibrutinib.  He was on [place] which is an anti-CD20 monoclonal antibody in 2021.  He had a repeat bone marrow biopsy for worsening anemia in April 2022 and that showed CLL.  With 17 P/L q. deletion.  He was restarted on ibrutinib for 20 mg/day in early May 2022 .    In the ED vitals temperature of 99.3, BP 130/75, heart rate of 136. Labs hemoglobin of 9.1, platelet 80, WBC 16 and creatinine 1.23.  Blood culture was sent and he was started on Vanco and meropenem. CT abdomen was done and it showed Spleen at the upper limits of normal, advanced atherosclerosis throughout the aortoiliac axis with biiliac stents, retroperitoneal lymphadenopathy with left para-aortic nodal mass measuring 4.3 to 2.4 cm which was unchanged.  There was prostatomegaly.  There was a new airspace opacity within the left lower lobe compatible with pneumonia.  There was nonobstructing  right nephrolithiasis. I am seeing the patient as blood culture positive for staph epidermidis   Past Medical History:  Diagnosis Date   CLL (chronic lymphocytic leukemia) ([place])    Constipation    Depression    Diabetes mellitus without complication ([place])    GERD (gastroesophageal reflux disease)    Hematuria    Hyperlipidemia    Hypertension    Therapeutic opioid induced constipation     Past Surgical History:  Procedure Laterality Date   CATARACT EXTRACTION W/ INTRAOCULAR LENS IMPLANT Bilateral    CHOLECYSTECTOMY  1983   COLONOSCOPY WITH PROPOFOL N/A 10/27/2017   Procedure: COLONOSCOPY WITH PROPOFOL;  Surgeon: Manya Silvas, MD;  Location: [facility] ENDOSCOPY;  Service: Endoscopy;  Laterality: N/A;   ESOPHAGOGASTRODUODENOSCOPY (EGD) WITH PROPOFOL N/A 11/20/2016   Procedure: ESOPHAGOGASTRODUODENOSCOPY (EGD) WITH PROPOFOL;  Surgeon: Manya Silvas, MD;  Location[facility] END[facility]COPY;  Service: Endoscopy;  Laterality: N/A;   ESOPHAGOGASTRODUODENOSCOPY (EGD) WITH PROPOFOL N/A 10/27/2017   Procedure: ESOPHAGOGASTRODUODENOSCOPY (EGD) WITH PROPOFOL;  Surgeon: Manya Silvas, MD;  Location: [facility] ENDOSCOPY;  Service: Endoscopy;  Laterality: N/A;   ESOPHAGOGASTRODUODENOSCOPY (EGD) WITH PROPOFOL N/A 11/23/2020   Procedure: ESOPHAGOGASTRODUODENOSCOPY (EGD) WITH PROPOFOL;  Surgeon: Jonathon Bellows, MD;  Location: [facility] ENDOSCOPY;  Service: Gastroenterology;  Laterality: N/A;   INTRATHECAL PUMP IMPLANT Left 02/07/2020   Procedure: INTRATHECAL PUMP & CATHETER IMPLANT;  Surgeon: Deetta Perla, MD;  Location: [facility] ORS;  Service: Neurosurgery;  Laterality: Left;   LOWER EXTREMITY ANGIOGRAPHY Right 05/19/2017   Procedure: LOWER EXTREMITY ANGIOGRAPHY;  Surgeon: Algernon Huxley,  MD;  Location: [place] CV LAB;  Service: Cardiovascular;  Laterality: Right;   LOWER EXTREMITY ANGIOGRAPHY Left 08/10/2018   Procedure: LOWER EXTREMITY ANGIOGRAPHY;  Surgeon: Algernon Huxley, MD;  Location: [place] CV LAB;  Service:  Cardiovascular;  Laterality: Left;    Social History   Socioeconomic History   Marital status: Married    Spouse name: [name]   Number of children: 1   Years of education: Not on file   Highest education level: Not on file  Occupational History   Occupation: retired    Comment: truck driver  Tobacco Use   Smoking status: Every Day    Packs/day: 0.50    Years: 47.00    Pack years: 23.50    Types: Cigarettes   Smokeless tobacco: Never  Vaping Use   Vaping Use: Never used  Substance and Sexual Activity   Alcohol use: Yes    Alcohol/week: 1.0 standard drink    Types: 1 Glasses of wine per week    Comment:  almost none in last 6 months   Drug use: No   Sexual activity: Never    Partners: Female  Other Topics Concern   Not on file  Social History Narrative   Not on file   Social Determinants of Health   Financial Resource Strain: Not on file  Food Insecurity: Not on file  Transportation Needs: Not on file  Physical Activity: Not on file  Stress: Not on file  Social Connections: Not on file  Intimate Partner Violence: Not on file    Family History  Problem Relation Age of Onset   Hypertension Sister    No Known Allergies I? Current Facility-Administered Medications  Medication Dose Route Frequency Provider Last Rate Last Admin   acetaminophen (TYLENOL) tablet 650 mg  650 mg Oral Q6H PRN Agbata, Tochukwu, MD   650 mg at 11/30/20 1956   Or   acetaminophen (TYLENOL) suppository 650 mg  650 mg Rectal Q6H PRN Agbata, Tochukwu, MD       azithromycin (ZITHROMAX) tablet 500 mg  500 mg Oral Daily Wieting, Richard, MD   500 mg at 12/01/20 1225   cefTRIAXone (ROCEPHIN) 2 g in sodium chloride 0.9 % 100 mL IVPB  2 g Intravenous Q24H Wieting, Richard, MD       feeding supplement (BOOST / RESOURCE BREEZE) liquid 1 Container  1 Container Oral TID BM Wieting, Richard, MD       feeding supplement (KATE FARMS STANDARD 1.4) liquid 325 mL  325 mL Oral TID BM Wieting, Richard, MD        folic acid (FOLVITE) tablet 1 mg  1 mg Oral Daily Agbata, Tochukwu, MD   1 mg at 12/01/20 0926   gabapentin (NEURONTIN) capsule 200 mg  200 mg Oral BID Agbata, Tochukwu, MD   200 mg at 12/01/20 8756   hydrocortisone sodium succinate (SOLU-CORTEF) 100 MG injection 50 mg  50 mg Intravenous Q12H Wieting, Richard, MD   50 mg at 12/01/20 4332   ibrutinib TABS 420 mg  420 mg Oral Daily Agbata, Tochukwu, MD       insulin aspart (novoLOG) injection 0-9 Units  0-9 Units Subcutaneous TID WC Agbata, Tochukwu, MD   1 Units at 12/01/20 1225   multivitamin with minerals tablet 1 tablet  1 tablet Oral Daily Agbata, Tochukwu, MD   1 tablet at 12/01/20 0926   naloxegol oxalate (MOVANTIK) tablet 12.5 mg  12.5 mg Oral Daily Agbata, Tochukwu, MD   12.5 mg at  12/01/20 0926   nicotine (NICODERM CQ - dosed in mg/24 hours) patch 14 mg  14 mg Transdermal Daily Agbata, Tochukwu, MD   14 mg at 12/01/20 0926   ondansetron (ZOFRAN) tablet 4 mg  4 mg Oral Q6H PRN Agbata, Tochukwu, MD       Or   ondansetron (ZOFRAN) injection 4 mg  4 mg Intravenous Q6H PRN Agbata, Tochukwu, MD       oxyCODONE (Oxy IR/ROXICODONE) immediate release tablet 10 mg  10 mg Oral Q6H PRN Athena Masse, MD   10 mg at 11/30/20 2028   pantoprazole (PROTONIX) EC tablet 40 mg  40 mg Oral QHS Agbata, Tochukwu, MD   40 mg at 11/30/20 2210     Abtx:  Anti-infectives (From admission, onward)    Start     Dose/Rate Route Frequency Ordered Stop   12/01/20 1600  vancomycin (VANCOCIN) IVPB 1000 mg/200 mL premix  Status:  Discontinued        1,000 mg 200 mL/hr over 60 Minutes Intravenous Every 24 hours 11/30/20 1707 11/30/20 1708   12/01/20 1600  vancomycin (VANCOCIN) IVPB 1000 mg/200 mL premix  Status:  Discontinued        1,000 mg 200 mL/hr over 60 Minutes Intravenous Every 24 hours 11/30/20 1708 12/01/20 1056   12/01/20 1600  cefTRIAXone (ROCEPHIN) 2 g in sodium chloride 0.9 % 100 mL IVPB        2 g 200 mL/hr over 30 Minutes Intravenous Every 24 hours  12/01/20 1056     12/01/20 1200  azithromycin (ZITHROMAX) tablet 500 mg        500 mg Oral Daily 12/01/20 1056 12/06/20 0959   11/30/20 2200  meropenem (MERREM) 1 g in sodium chloride 0.9 % 100 mL IVPB  Status:  Discontinued        1 g 200 mL/hr over 30 Minutes Intravenous Every 12 hours 11/30/20 1707 11/30/20 1708   11/30/20 2200  meropenem (MERREM) 1 g in sodium chloride 0.9 % 100 mL IVPB  Status:  Discontinued        1 g 200 mL/hr over 30 Minutes Intravenous Every 12 hours 11/30/20 1708 12/01/20 1056   11/30/20 1630  vancomycin (VANCOCIN) IVPB 1000 mg/200 mL premix  Status:  Discontinued        1,000 mg 200 mL/hr over 60 Minutes Intravenous  Once 11/30/20 1623 11/30/20 1626   11/30/20 1630  meropenem (MERREM) 1 g in sodium chloride 0.9 % 100 mL IVPB  Status:  Discontinued        1 g 200 mL/hr over 30 Minutes Intravenous  Once 11/30/20 1623 11/30/20 1625   11/30/20 1545  vancomycin (VANCOCIN) IVPB 1000 mg/200 mL premix        1,000 mg 200 mL/hr over 60 Minutes Intravenous  Once 11/30/20 1532 11/30/20 1812   11/30/20 1545  ceFEPIme (MAXIPIME) 2 g in sodium chloride 0.9 % 100 mL IVPB        2 g 200 mL/hr over 30 Minutes Intravenous  Once 11/30/20 1532 11/30/20 1638       REVIEW OF SYSTEMS:  Const: negative fever, negative chills, + weight loss Eyes: negative diplopia or visual changes, negative eye pain ENT: negative coryza, negative sore throat Resp: negative cough, hemoptysis, dyspnea Cards: negative for chest pain, palpitations, lower extremity edema GU: negative for frequency, dysuria and hematuria GI: Negative for abdominal pain, diarrhea, bleeding, constipation Skin: negative for rash and pruritus Heme:  easy bruising  MS: generalized weakness Neurolo:dizziness,  falls Psych: negative for feelings of anxiety, depression  Endocrine: negative for thyroid, diabetes Allergy/Immunology- negative for any medication or food allergies ?  Objective:  VITALS:  BP 101/63 (BP  Location: Right Arm)   Pulse 79   Temp 98.8 F (37.1 C) (Oral)   Resp 16   Ht _0  (1.727 m)   Wt 59 kg   SpO2 97%   BMI 19.78 kg/m  PHYSICAL EXAM:  General: Alert, cooperative, no distress, chronically ill, pale  Head: Normocephalic, without obvious abnormality, atraumatic. Eyes: Conjunctivae clear, anicteric sclerae. Pupils are equal ENT Nares normal. No drainage or sinus tenderness. Lips, mucosa, and tongue normal. No Thrush edetulous Neck: Supple, symmetrical, no adenopathy, thyroid: non tender no carotid bruit and no JVD. Back: No CVA tenderness. Lungs: b/l air entry- few crepts  base Heart: Regular rate and rhythm, no murmur, rub or gallop. Abdomen: Soft, non-tender,not distended. Bowel sounds normal. No masses Pump felt left lower quadrant- no tenderness or erythema Lumbar scar Extremities: multiple scabs, bruises Skin: as above Lymph: Cervical, supraclavicular normal. Neurologic: Grossly non-focal Pertinent Labs Lab Results CBC    Component Value Date/Time   WBC 10.4 12/01/2020 0443   RBC 2.18 (L) 12/01/2020 0443   HGB 7.3 (L) 12/01/2020 0443   HGB 11.4 (L) 08/09/2014 1122   HCT 22.3 (L) 12/01/2020 0443   HCT 35.6 (L) 08/09/2014 1122   PLT 59 (L) 12/01/2020 0443   PLT 95 (L) 08/09/2014 1122   MCV 102.3 (H) 12/01/2020 0443   MCV 100 08/09/2014 1122   MCH 33.5 12/01/2020 0443   MCHC 32.7 12/01/2020 0443   RDW 21.4 (H) 12/01/2020 0443   RDW 18.5 (H) 08/09/2014 1122   LYMPHSABS 4.3 (H) 11/21/2020 1400   LYMPHSABS 6.5 (H) 08/09/2014 1122   MONOABS 0.9 11/21/2020 1400   MONOABS 1.1 (H) 08/09/2014 1122   EOSABS 0.0 11/21/2020 1400   EOSABS 0.1 08/09/2014 1122   BASOSABS 0.0 11/21/2020 1400   BASOSABS 0.1 08/09/2014 1122    CMP Latest Ref Rng & Units 12/01/2020 11/30/2020 11/22/2020  Glucose 70 - 99 mg/dL 150(H) 149(H) 116(H)  BUN 8 - 23 mg/dL 27(H) 22 37(H)  Creatinine 0.61 - 1.24 mg/dL 1.09 1.23 1.13  Sodium 135 - 145 mmol/L 139 144 137  Potassium 3.5 -  5.1 mmol/L 3.7 4.0 4.4  Chloride 98 - 111 mmol/L 106 108 110  CO2 22 - 32 mmol/L 27 26 21(L)  Calcium 8.9 - 10.3 mg/dL 8.0(L) 8.5(L) 7.9(L)  Total Protein 6.5 - 8.1 g/dL - 6.3(L) -  Total Bilirubin 0.3 - 1.2 mg/dL - 2.6(H) -  Alkaline Phos 38 - 126 U/L - 71 -  AST 15 - 41 U/L - 21 -  ALT 0 - 44 U/L - 12 -      Microbiology: Recent Results (from the past 240 hour(s))  Blood culture (routine single)     Status: None   Collection Time: 11/21/20  2:00 PM   Specimen: BLOOD  Result Value Ref Range Status   Specimen Description BLOOD BLOOD RIGHT FOREARM  Final   Special Requests   Final    BOTTLES DRAWN AEROBIC AND ANAEROBIC Blood Culture adequate volume   Culture   Final    NO GROWTH 7 DAYS Performed at [facility], [address] [place], [place] [address]    Report Status 11/28/2020 FINAL  Final  Resp Panel by RT-PCR (Flu A&B, Covid) Nasopharyngeal Swab     Status: None   Collection Time: 11/21/20  2:00 PM   Specimen: Nasopharyngeal Swab; Nasopharyngeal(NP) swabs in vial transport medium  Result Value Ref Range Status   SARS Coronavirus 2 by RT PCR NEGATIVE NEGATIVE Final    Comment: (NOTE) SARS-CoV-2 target nucleic acids are NOT DETECTED.  The SARS-CoV-2 RNA is generally detectable in upper respiratory specimens during the acute phase of infection. The lowest concentration of SARS-CoV-2 viral copies this assay can detect is 138 copies/mL. A negative result does not preclude SARS-Cov-2 infection and should not be used as the sole basis for treatment or other patient management decisions. A negative result may occur with  improper specimen collection/handling, submission of specimen other than nasopharyngeal swab, presence of viral mutation(s) within the areas targeted by this assay, and inadequate number of viral copies(<138 copies/mL). A negative result must be combined with clinical observations, patient history, and epidemiological information. The expected  result is Negative.  Fact Sheet for Patients:  EntrepreneurPulse.com.au  Fact Sheet for Healthcare Providers:  IncredibleEmployment.be  This test is no t yet approved or cleared by the [place] FDA and  has been authorized for detection and/or diagnosis of SARS-CoV-2 by FDA under an Emergency Use Authorization (EUA). This EUA will remain  in effect (meaning this test can be used) for the duration of the COVID-19 declaration under Section 564(b)(1) of the Act, 21 U.S.C.section 360bbb-3(b)(1), unless the authorization is terminated  or revoked sooner.       Influenza A by PCR NEGATIVE NEGATIVE Final   Influenza B by PCR NEGATIVE NEGATIVE Final    Comment: (NOTE) The Xpert Xpress SARS-CoV-2/FLU/RSV plus assay is intended as an aid in the diagnosis of influenza from Nasopharyngeal swab specimens and should not be used as a sole basis for treatment. Nasal washings and aspirates are unacceptable for Xpert Xpress SARS-CoV-2/FLU/RSV testing.  Fact Sheet for Patients: EntrepreneurPulse.com.au  Fact Sheet for Healthcare Providers: IncredibleEmployment.be  This test is not yet approved or cleared by the [place] FDA and has been authorized for detection and/or diagnosis of SARS-CoV-2 by FDA under an Emergency Use Authorization (EUA). This EUA will remain in effect (meaning this test can be used) for the duration of the COVID-19 declaration under Section 564(b)(1) of the Act, 21 U.S.C. section 360bbb-3(b)(1), unless the authorization is terminated or revoked.  Performed at [facility], [address] [place], [place] [address]   Blood culture (single)     Status: None   Collection Time: 11/21/20  2:02 PM   Specimen: BLOOD  Result Value Ref Range Status   Specimen Description BLOOD BLOOD LEFT FOREARM  Final   Special Requests   Final    BOTTLES DRAWN AEROBIC AND ANAEROBIC Blood Culture  results may not be optimal due to an excessive volume of blood received in culture bottles   Culture   Final    NO GROWTH 7 DAYS Performed at [facility], [address] [place], [place] [address]    Report Status 11/28/2020 FINAL  Final  Blood Culture (routine x 2)     Status: None (Preliminary result)   Collection Time: 11/30/20  2:32 PM   Specimen: BLOOD  Result Value Ref Range Status   Specimen Description BLOOD LEFT ANTECUBITAL  Final   Special Requests   Final    BOTTLES DRAWN AEROBIC AND ANAEROBIC Blood Culture adequate volume   Culture  Setup Time   Final    Organism ID to follow GRAM POSITIVE COCCI IN BOTH AEROBIC AND ANAEROBIC BOTTLES CRITICAL RESULT CALLED TO, READ BACK  BY AND VERIFIED WITH: [facility] AT 1010 12/01/20.PMF Performed at [facility], [address] [place], [place] [address]    Culture [facility] POSITIVE COCCI  Final   Report Status PENDING  Incomplete  Resp Panel by RT-PCR (Flu A&B, Covid) Nasopharyngeal Swab     Status: None   Collection Time: 11/30/20  2:32 PM   Specimen: Nasopharyngeal Swab; Nasopharyngeal(NP) swabs in vial transport medium  Result Value Ref Range Status   SARS Coronavirus 2 by RT PCR NEGATIVE NEGATIVE Final    Comment: (NOTE) SARS-CoV-2 target nucleic acids are NOT DETECTED.  The SARS-CoV-2 RNA is generally detectable in upper respiratory specimens during the acute phase of infection. The lowest concentration of SARS-CoV-2 viral copies this assay can detect is 138 copies/mL. A negative result does not preclude SARS-Cov-2 infection and should not be used as the sole basis for treatment or other patient management decisions. A negative result may occur with  improper specimen collection/handling, submission of specimen other than nasopharyngeal swab, presence of viral mutation(s) within the areas targeted by this assay, and inadequate number of viral copies(<138 copies/mL). A negative result must be combined  with clinical observations, patient history, and epidemiological information. The expected result is Negative.  Fact Sheet for Patients:  EntrepreneurPulse.com.au  Fact Sheet for Healthcare Providers:  IncredibleEmployment.be  This test is no t yet approved or cleared by the [place] FDA and  has been authorized for detection and/or diagnosis of SARS-CoV-2 by FDA under an Emergency Use Authorization (EUA). This EUA will remain  in effect (meaning this test can be used) for the duration of the COVID-19 declaration under Section 564(b)(1) of the Act, 21 U.S.C.section 360bbb-3(b)(1), unless the authorization is terminated  or revoked sooner.       Influenza A by PCR NEGATIVE NEGATIVE Final   Influenza B by PCR NEGATIVE NEGATIVE Final    Comment: (NOTE) The Xpert Xpress SARS-CoV-2/FLU/RSV plus assay is intended as an aid in the diagnosis of influenza from Nasopharyngeal swab specimens and should not be used as a sole basis for treatment. Nasal washings and aspirates are unacceptable for Xpert Xpress SARS-CoV-2/FLU/RSV testing.  Fact Sheet for Patients: EntrepreneurPulse.com.au  Fact Sheet for Healthcare Providers: IncredibleEmployment.be  This test is not yet approved or cleared by the [place] FDA and has been authorized for detection and/or diagnosis of SARS-CoV-2 by FDA under an Emergency Use Authorization (EUA). This EUA will remain in effect (meaning this test can be used) for the duration of the COVID-19 declaration under Section 564(b)(1) of the Act, 21 U.S.C. section 360bbb-3(b)(1), unless the authorization is terminated or revoked.  Performed at [facility], [address] [place], [place] [address]   Blood Culture ID Panel (Reflexed)     Status: Abnormal   Collection Time: 11/30/20  2:32 PM  Result Value Ref Range Status   Enterococcus faecalis NOT DETECTED NOT DETECTED  Final   Enterococcus Faecium NOT DETECTED NOT DETECTED Final   Listeria monocytogenes NOT DETECTED NOT DETECTED Final   Staphylococcus species DETECTED (A) NOT DETECTED Final    Comment: CRITICAL RESULT CALLED TO, READ BACK BY AND VERIFIED WITH: CHARLES SHANLEVER AT 1010 12/01/20.PMF    Staphylococcus aureus (BCID) NOT DETECTED NOT DETECTED Final   Staphylococcus epidermidis DETECTED (A) NOT DETECTED Final    Comment: CRITICAL RESULT CALLED TO, READ BACK BY AND VERIFIED WITH: CHARLES SHANLEVER AT 1010 12/01/20.PMF    Staphylococcus lugdunensis NOT DETECTED NOT DETECTED Final   Streptococcus species NOT DETECTED NOT  DETECTED Final   Streptococcus agalactiae NOT DETECTED NOT DETECTED Final   Streptococcus pneumoniae NOT DETECTED NOT DETECTED Final   Streptococcus pyogenes NOT DETECTED NOT DETECTED Final   A.calcoaceticus-baumannii NOT DETECTED NOT DETECTED Final   Bacteroides fragilis NOT DETECTED NOT DETECTED Final   Enterobacterales NOT DETECTED NOT DETECTED Final   Enterobacter cloacae complex NOT DETECTED NOT DETECTED Final   Escherichia coli NOT DETECTED NOT DETECTED Final   Klebsiella aerogenes NOT DETECTED NOT DETECTED Final   Klebsiella oxytoca NOT DETECTED NOT DETECTED Final   Klebsiella pneumoniae NOT DETECTED NOT DETECTED Final   Proteus species NOT DETECTED NOT DETECTED Final   Salmonella species NOT DETECTED NOT DETECTED Final   Serratia marcescens NOT DETECTED NOT DETECTED Final   Haemophilus influenzae NOT DETECTED NOT DETECTED Final   Neisseria meningitidis NOT DETECTED NOT DETECTED Final   Pseudomonas aeruginosa NOT DETECTED NOT DETECTED Final   Stenotrophomonas maltophilia NOT DETECTED NOT DETECTED Final   Candida albicans NOT DETECTED NOT DETECTED Final   Candida auris NOT DETECTED NOT DETECTED Final   Candida glabrata NOT DETECTED NOT DETECTED Final   Candida krusei NOT DETECTED NOT DETECTED Final   Candida parapsilosis NOT DETECTED NOT DETECTED Final   Candida  tropicalis NOT DETECTED NOT DETECTED Final   Cryptococcus neoformans/gattii NOT DETECTED NOT DETECTED Final   Methicillin resistance mecA/C NOT DETECTED NOT DETECTED Final    Comment: Performed at [facility], [address] [place], [place] [address]    IMAGING RESULTS:  I have personally reviewed the films ? Impression/Recommendation 70 year old male with history of CLL on ibrutinib, anemia, chronic epigastric pain, presence of an pain pump with intrathecal catheter in the lumbar area, accession of the pain pump on 11/28/2020 presents with weakness and fever.  Staph  epidermidis bacteremia 2 out of 2 bottles.  This could be a contaminant but he does have multiple scabs and wounds and also his pain pump was accessed 2 days ago Left lower lobe pneumonia Both of which can be treated with ceftriaxone. ? _CLL- on ibrutinib  Thrombocytopenia Anemia  __AKI improved ________________________________________________ Discussed with patient, requesting provider Note:  This document was prepared using Dragon voice recognition software and may include unintentional dictation errors.

## 2020-12-01 NOTE — Evaluation (Addendum)
Clinical/Bedside Swallow Evaluation Patient Details  Name: James [name] MRN: [id number] Date of Birth: January 10, 1951  Today's Date: 12/01/2020 Time: SLP Start Time (ACUTE ONLY): 15 SLP Stop Time (ACUTE ONLY): 1030 SLP Time Calculation (min) (ACUTE ONLY): 50 min  Past Medical History:  Past Medical History:  Diagnosis Date   CLL (chronic lymphocytic leukemia) ([place])    Constipation    Depression    Diabetes mellitus without complication (HCC)    GERD (gastroesophageal reflux disease)    Hematuria    Hyperlipidemia    Hypertension    Therapeutic opioid induced constipation    Past Surgical History:  Past Surgical History:  Procedure Laterality Date   CATARACT EXTRACTION W/ INTRAOCULAR LENS IMPLANT Bilateral    CHOLECYSTECTOMY  1983   COLONOSCOPY WITH PROPOFOL N/A 10/27/2017   Procedure: COLONOSCOPY WITH PROPOFOL;  Surgeon: Manya Silvas, MD;  Location: [facility] ENDOSCOPY;  Service: Endoscopy;  Laterality: N/A;   ESOPHAGOGASTRODUODENOSCOPY (EGD) WITH PROPOFOL N/A 11/20/2016   Procedure: ESOPHAGOGASTRODUODENOSCOPY (EGD) WITH PROPOFOL;  Surgeon: Manya Silvas, MD;  Location: [facility] ENDOSCOPY;  Service: Endoscopy;  Laterality: N/A;   ESOPHAGOGASTRODUODENOSCOPY (EGD) WITH PROPOFOL N/A 10/27/2017   Procedure: ESOPHAGOGASTRODUODENOSCOPY (EGD) WITH PROPOFOL;  Surgeon: Manya Silvas, MD;  Location: [facility] ENDOSCOPY;  Service: Endoscopy;  Laterality: N/A;   ESOPHAGOGASTRODUODENOSCOPY (EGD) WITH PROPOFOL N/A 11/23/2020   Procedure: ESOPHAGOGASTRODUODENOSCOPY (EGD) WITH PROPOFOL;  Surgeon: Jonathon Bellows, MD;  Location: [facility] ENDOSCOPY;  Service: Gastroenterology;  Laterality: N/A;   INTRATHECAL PUMP IMPLANT Left 02/07/2020   Procedure: INTRATHECAL PUMP & CATHETER IMPLANT;  Surgeon: Deetta Perla, MD;  Location: [facility] ORS;  Service: Neurosurgery;  Laterality: Left;   LOWER EXTREMITY ANGIOGRAPHY Right 05/19/2017   Procedure: LOWER EXTREMITY ANGIOGRAPHY;  Surgeon: Algernon Huxley, MD;  Location: [place] CV LAB;  Service: Cardiovascular;  Laterality: Right;   LOWER EXTREMITY ANGIOGRAPHY Left 08/10/2018   Procedure: LOWER EXTREMITY ANGIOGRAPHY;  Surgeon: Algernon Huxley, MD;  Location: [place] CV LAB;  Service: Cardiovascular;  Laterality: Left;   HPI:  Pt is a 70 y.o. male with medical history significant for CLL on chemotherapy, opioid-induced constipation, diabetes mellitus, depression, GERD who was recently discharged from the hospital on July 30 , 2022 following admission for symptomatic anemia.  He presents presents to the emergency room via EMS for evaluation of weakness and fever.  Per patient he has been very weak since he left the hospital but started having a fever over the last couple of days.  He denies having a cough and denies feeling dizzy or lightheaded.  He has not had any recent falls.  He has abdominal pain mostly in the epigastrium which is chronic but denies having any changes in his bowel habits, no leg swelling, no shortness of breath, no urinary symptoms, no palpitations or diaphoresis.  He denies having any difficulty or painful swallowing.  CXR: Left lower lobe pneumonia -- last CXR on 11/21/2020 was clear.  Noted Dietician assessment -- Severe malnutrition in context of chronic illness.   Assessment / Plan / Recommendation Clinical Impression  Pt appears to present w/ adequate oropharyngeal phase swallow function w/ No oropharyngeal phase dysphagia noted, No neuromuscular deficits noted. Pt consumed po trials w/ No overt, clinical s/s of aspiration during po trials. Pt appears at reduced risk for aspiration following general aspiration precautions. He is Edentulous at Baseline -- not wearing his Dentures often when eating foods.   During po trials, pt consumed all consistencies w/ no overt coughing, decline in vocal quality,  or change in respiratory presentation during/post trials. Oral phase appeared [facility] w/ timely bolus management, mashing/gumming of softened solid  foods, and control of bolus propulsion for A-P transfer for swallowing. Oral clearing achieved w/ all trial consistencies. OM Exam appeared [facility] w/ no unilateral weakness noted. Speech Clear. Pt fed self w/ setup support.   Discussed w/ pt and friend/caregiver present different food options, food prep for ease of mastication d/t Edentulous status, and the practice of frequent mini meals during the day -- f/u w/ Dietician necessary.  Recommend a Regular consistency diet w/ well-Cut meats/foods, moistened foods; Thin liquids VIA CUP - pt does not use straws at home. Recommend general aspiration and REFLUX precautions, Pills WHOLE in Puree for safer, easier swallowing as pt described Larger pills causing difficulty to swallow. Education given on Pills in Puree; food consistencies and easy to eat options; general aspiration precautions. Recommend Dietician f/u for support(baseline for pt). NSG to reconsult if any new needs arise. NSG agreed. SLP Visit Diagnosis: Dysphagia, unspecified (R13.10)    Aspiration Risk   (reduced following general aspiration precautions)    Diet Recommendation  Regular consistency diet w/ well-Cut meats/foods, moistened foods; Thin liquids VIA CUP - pt does not use straws at home. Recommend general aspiration and REFLUX precautions  Medication Administration: Other (Comment) Pills WHOLE in Puree for safer, easier swallowing as pt described Larger pills causing difficulty to swallow.   Other  Recommendations Recommended Consults:  (Dietician f/u) Oral Care Recommendations: Oral care BID;Oral care before and after PO;Patient independent with oral care Other Recommendations:  (n/a)   Follow up Recommendations None      Frequency and Duration  (n/a)   (n/a)       Prognosis Prognosis for Safe Diet Advancement: Good Barriers to Reach Goals: Motivation;Time post onset;Severity of deficits (see Dietician's note)      Swallow Study   General Date of Onset: 11/30/20 HPI:  Pt is a 70 y.o. male with medical history significant for CLL on chemotherapy, opioid-induced constipation, diabetes mellitus, depression, GERD who was recently discharged from the hospital on July 30 , 2022 following admission for symptomatic anemia.  He presents presents to the emergency room via EMS for evaluation of weakness and fever.  Per patient he has been very weak since he left the hospital but started having a fever over the last couple of days.  He denies having a cough and denies feeling dizzy or lightheaded.  He has not had any recent falls.  He has abdominal pain mostly in the epigastrium which is chronic but denies having any changes in his bowel habits, no leg swelling, no shortness of breath, no urinary symptoms, no palpitations or diaphoresis.  He denies having any difficulty or painful swallowing.  CXR: Left lower lobe pneumonia -- last CXR on 11/21/2020 was clear.  Noted Dietician assessment -- Severe malnutrition in context of chronic illness. Type of Study: Bedside Swallow Evaluation Previous Swallow Assessment: none Diet Prior to this Study: Regular;Thin liquids Temperature Spikes Noted: No (wbc 10.4) Respiratory Status: Room air History of Recent Intubation: No Behavior/Cognition: Alert;Cooperative;Pleasant mood Oral Cavity Assessment: Dry (min) Oral Care Completed by SLP: Yes Oral Cavity - Dentition: Edentulous (does not wear his Dentures often at home) Vision: Functional for self-feeding Self-Feeding Abilities: Able to feed self;Needs assist;Needs set up Patient Positioning: Upright in bed (needed min positioning support) Baseline Vocal Quality: Normal Volitional Cough: Strong Volitional Swallow: Able to elicit    Oral/Motor/Sensory Function Overall Oral Motor/Sensory Function: Within functional  limits   Ice Chips Ice chips: Within functional limits Presentation: Spoon (fed; 3 trials)   Thin Liquid Thin Liquid: Within functional limits Presentation: Cup;Self Fed (~4  ozs+)    Nectar Thick Nectar Thick Liquid: Not tested   Honey Thick Honey Thick Liquid: Not tested   Puree Puree: Within functional limits Presentation: Self Fed;Spoon (6 trials)   Solid     Solid: Within functional limits (grossly - edentulous) Presentation: Self Fed (5 trials)         Orinda Kenner, MS, CCC-SLP Speech Language Pathologist Rehab Services [phone] Roanna Reaves 12/01/2020,1:11 PM

## 2020-12-01 NOTE — Progress Notes (Signed)
Mobility Specialist - Progress Note   12/01/20 1600  Mobility  Activity Refused mobility  Mobility performed by Mobility specialist    Pt politely declined mobility this date, no reason specified. States he just finished working with PT and would like to rest at this time. Will attempt session another date/time.    Kathee Delton Mobility Specialist 12/01/20, 4:39 PM

## 2020-12-01 NOTE — Progress Notes (Signed)
Initial Nutrition Assessment  DOCUMENTATION CODES:  Severe malnutrition in context of chronic illness  INTERVENTION:  Add Anda [employer] 1.4 po TID, each supplement provides 455 kcal and 20 grams of protein.   Add Boost Breeze po TID, each supplement provides 250 kcal and 9 grams of protein.  Continue MVI with minerals daily.  NUTRITION DIAGNOSIS:  Severe Malnutrition related to chronic illness, cancer and cancer related treatments as evidenced by severe fat depletion, severe muscle depletion, percent weight loss.  GOAL:  Patient will meet greater than or equal to 90% of their needs  MONITOR:  PO intake, Supplement acceptance, Labs, Weight trends, I & O's  REASON FOR ASSESSMENT:  Malnutrition Screening Tool    ASSESSMENT:  70 yo male with a PMH of  anemia, CLL on chemo, depression, T2DM, GERD, HLD, HTN, CKD stage 3, chronic pain syndrome, chronic abdominal pain, diverticulosis, AVM, and recent admission for abdominal pain who presents with sepsis.  Of note, pt followed by outpatient RD at the cancer center.  Pt with limited PO intake and poor appetite at baseline given history of chronic abdominal pain. Takes Anda [employer] at home. Willing to drink it in the hospital. RD to also add Boost Breeze TID for extra calories, as pt does not like/cannot tolerate Boost/Ensure supplements.  Per Epic, pt has lost ~8 lbs (5.6%) in the last week, which is significant and severe for the time frame.  On exam, pt with mostly severe depletions throughout muscle and fat stores.  Recommend adding [employer] TID, Boost Breeze TID, and continuing MVI with minerals daily.  Medications: reviewed; folic acid, SSI, MVI with minerals, Protonix, LR @ 125 ml/hr via IV, Merrem BID via IV, Vancomycin via IV  Labs: reviewed; 107-242 (H), Folate 38 (WNL), Vitamin B12 315 (WNL), Vitamin D 31.81 (WNL) HbA1c: 5.6% (11/2020)  NUTRITION - FOCUSED PHYSICAL EXAM: Flowsheet Row Most Recent Value  Orbital Region  Moderate depletion  Upper Arm Region Severe depletion  Thoracic and Lumbar Region Severe depletion  Buccal Region Mild depletion  Temple Region Moderate depletion  Clavicle Bone Region Severe depletion  Clavicle and Acromion Bone Region Severe depletion  Scapular Bone Region Severe depletion  Dorsal Hand Severe depletion  Patellar Region Severe depletion  Anterior Thigh Region Severe depletion  Posterior Calf Region Severe depletion  Edema (RD Assessment) None  Hair Reviewed  Eyes Reviewed  Mouth Reviewed  Skin Reviewed  Nails Reviewed   Diet Order:   Diet Order             Diet Carb Modified Fluid consistency: Thin; Room service appropriate? Yes  Diet effective now                  EDUCATION NEEDS:  No education needs have been identified at this time  Skin:  Skin Assessment: Reviewed RN Assessment  Last BM:  PTA/unknown  Height:  Ht Readings from Last 1 Encounters:  11/30/20 5\' 8"  (1.727 m)   Weight:  Wt Readings from Last 1 Encounters:  11/30/20 59 kg   BMI:  Body mass index is 19.78 kg/m.  Estimated Nutritional Needs:  Kcal:  1950-2150 Protein:  90-105 grams Fluid:  >1.95 L  Derrel Nip, RD, LDN (she/her/hers) Registered Dietitian I After-Hours/Weekend Pager # in [place]

## 2020-12-01 NOTE — Evaluation (Signed)
Occupational Therapy Evaluation Patient Details Name: James [name] MRN: [id number] DOB: 01-Dec-1950 Today's Date: 12/01/2020    History of Present Illness Pt is a 70 y/o M admitted on 11/30/20 with c/c of weakness & fever. Pt was recently d/c from hospital 4 days prior after tx for symptomatic anemia. Pt found to have L lower lobe PNA. Pt is being treated for sepsis from [facility]. PMH: CLL on chemotherapy, opioid induced constipation, DM, depression, GERD   Clinical Impression   Pt was seen for OT evaluation this date. Prior to hospital admission, pt reports being independent with basic ADL, taking tub baths with no difficulty get up from the tub. Pt lives alone and reports his neighbor and friend help with meals, laundry, and transportation. He endorses 3-4 falls in past 30mo 2/2 his "legs give out." Pt reports 6/10 abdominal pain. Currently pt also demonstrates impairments in  strength, activity tolerance, balance, and safety as described below (See OT problem list) which functionally limit his ability to perform ADL/self-care tasks. When asked situational safety questions, pt remains consistent with answer that he would call his neighbor for help. Required cues for phone number to call in case of emergency. Pt currently requires supervision for safety for ADL and mobility. Pt would benefit from skilled OT services to address noted impairments and functional limitations (see below for any additional details) in order to maximize safety and independence while minimizing falls risk and caregiver burden. Upon hospital discharge, recommend [facility] [place] and 24/7 supervision assist for ADL and mobility at home to maximize pt safety and return to functional independence during meaningful occupations of daily life. High risk for readmission and falls.     Follow Up Recommendations  Home health OT;Supervision/Assistance - 24 hour    Equipment Recommendations  Tub/shower seat    Recommendations for Other Services        Precautions / Restrictions Precautions Precautions: Fall Restrictions Weight Bearing Restrictions: No      Mobility Bed Mobility Overal bed mobility: Needs Assistance Bed Mobility: Sit to Supine     Supine to sit: Modified independent (Device/Increase time);HOB elevated (with bed rails) Sit to supine: Modified independent (Device/Increase time)        Transfers Overall transfer level: Needs assistance Equipment used: None Transfers: Sit to/from Stand Sit to Stand: Supervision         General transfer comment: SBA to stand from recliner and transfer back to bed 2 steps away    Balance Overall balance assessment: Needs assistance Sitting-balance support: Feet supported;Bilateral upper extremity supported Sitting balance-Leahy Scale: Good     Standing balance support: No upper extremity supported Standing balance-Leahy Scale: Fair                             ADL either performed or assessed with clinical judgement   ADL Overall ADL's : Needs assistance/impaired                                       General ADL Comments: Pt currently requires supervision for safety for seated and STS ADL tasks     Vision         Perception     Praxis      Pertinent Vitals/Pain Pain Assessment: 0-10 Pain Score: 6  Pain Location: abdominal pain, pt reports chronic, no change wiht positioning Pain Descriptors / Indicators: Aching  Pain Intervention(s): Limited activity within patient's tolerance;Monitored during session;Repositioned     Hand Dominance     Extremity/Trunk Assessment Upper Extremity Assessment Upper Extremity Assessment: Overall WFL for tasks assessed   Lower Extremity Assessment Lower Extremity Assessment: Generalized weakness       Communication Communication Communication:  (unsure if pt has poor memory or word finding issues)   Cognition Arousal/Alertness: Awake/alert Behavior During Therapy: WFL for tasks  assessed/performed Overall Cognitive Status: Difficult to assess                                 General Comments: Unsure if pt has word finding issues or impaired cognition. Pt oriented to hospital & month but not year nor situation. Pt also with difficulty recalling the year he was born.   General Comments      Exercises     Shoulder Instructions      Home Living Family/patient expects to be discharged to:: Private residence Living Arrangements: Alone Available Help at Discharge: Friend(s);Available PRN/intermittently;Neighbor Type of Home: House Home Access: Stairs to enter [employer] of Steps: 2 Entrance Stairs-Rails: Right;Left (wideset) Home Layout: One level                   Additional Comments: rollator      Prior Functioning/Environment Level of Independence: Independent with assistive device(s);Needs assistance  Gait / Transfers Assistance Needed: Ambulatory with rollator, hx of falls, reports he drives to store & back (1.5 miles down the road) otherwise he doesn't drive. ADL's / Homemaking Assistance Needed: Pt reports his neighbor and another friend bring him food, do his laundry, mow the yard, and take him to doctor appointments.   Comments: Ambulatory with rollator, hx of falls, reports he drives to store & back (1.5 miles down the road) otherwise he doesn't drive.        OT Problem List: Decreased strength;Pain;Decreased cognition;Decreased safety awareness;Decreased activity tolerance;Impaired balance (sitting and/or standing);Decreased knowledge of use of DME or AE      OT Treatment/Interventions: Self-care/ADL training;Therapeutic exercise;Therapeutic activities;DME and/or AE instruction;Energy conservation;Patient/family education;Balance training;Cognitive remediation/compensation    OT Goals(Current goals can be found in the care plan section) Acute Rehab OT Goals Patient Stated Goal: go home soon OT Goal Formulation:  With patient Time For Goal Achievement: 12/15/20 Potential to Achieve Goals: Good ADL Goals Pt Will Transfer to Toilet: with supervision;ambulating;regular height toilet (LRAD PRN) Additional ADL Goal #1: Pt will perform daily morning ADL routine with supervision and PRN VC for safety, LRAD PRN. Additional ADL Goal #2: Pt will verbalize plan to implement at least 1 learned falls prevention strategy with visual/memory aide.  OT Frequency: Min 2X/week   Barriers to D/C:            Co-evaluation              AM-PAC OT "6 Clicks" Daily Activity     Outcome Measure Help from another person eating meals?: None Help from another person taking care of personal grooming?: A Little Help from another person toileting, which includes using toliet, bedpan, or urinal?: A Little Help from another person bathing (including washing, rinsing, drying)?: A Little Help from another person to put on and taking off regular upper body clothing?: None Help from another person to put on and taking off regular lower body clothing?: A Little 6 Click Score: 20   End of Session    Activity Tolerance: Patient tolerated  treatment well Patient left: in bed;with call bell/phone within reach;with bed alarm set  OT Visit Diagnosis: Other abnormalities of gait and mobility (R26.89);Repeated falls (R29.6);Muscle weakness (generalized) (M62.81)                Time: 5670-1410 OT Time Calculation (min): 17 min Charges:  OT General Charges $OT Visit: 1 Visit OT Evaluation $OT Eval Moderate Complexity: 1 Mod OT Treatments $Self Care/Home Management : 8-22 mins  Hanley Hays, MPH, MS, OTR/L ascom [phone] 12/01/20, 4:02 PM

## 2020-12-01 NOTE — TOC Initial Note (Addendum)
Transition of Care [facility]) - Initial/Assessment Note    Patient Details  Name: James [name] MRN: [id number] Date of Birth: 1951-01-01  Transition of Care [facility]) CM/SW Contact:    Candie Chroman, LCSW Phone Number: 12/01/2020, 8:18 AM  Clinical Narrative:  Readmission prevention screen was completed by [facility] team on 7/28: "Pt is alert and oriented. Pt states he is agreeable to [facility] and has no agency preference. Pt states he lives alone. Pt states his son and friend take him to his doctor appointments. Pt sees his Cancer Doctor Dr. Blenda Peals Pt states he gets his meds from [facility] [address]. PT states he wasn't getting any in home services prior to admission. CSW has provided the referral to [facility] and they will service." Discharge home health orders are for PT and OT. Sent secure email to [facility] representative to confirm these are the services he is receiving.                              8:47 am: [facility] representative confirmed PT and OT.  10:54 am: Per speech, friend/caregiver at bedside has concerns about patient's living situation and ability to care for himself. This CSW working remote today. Tried calling in the room but no answer. Sent secure chat to MD requesting PT and OT orders.  Expected Discharge Plan: [place] Barriers to Discharge: Continued Medical Work up   Patient Goals and CMS Choice     Choice offered to / list presented to : NA  Expected Discharge Plan and Services Expected Discharge Plan: [place] Acute Care Choice: Resumption of Svcs/PTA Provider Living arrangements for the past 2 months: [place] Arranged: PT, OT [place] Agency: [place] ([place]) Date [place]: 12/01/20   Representative spoke with at [place]: [place] email to [employer]  Prior Living Arrangements/Services Living arrangements for the past 2 months: [place] with:: Self Patient language and need for interpreter reviewed:: Yes Do you feel safe going back to the place where you live?: Yes      Need for Family Participation in Patient Care: Yes (Comment)   Current home services: Home OT, Home PT Criminal Activity/Legal Involvement Pertinent to Current Situation/Hospitalization: No - Comment as needed  Activities of Daily Living Home Assistive Devices/Equipment: Gilford Rile (specify type) ADL Screening (condition at time of admission) Is the patient deaf or have difficulty hearing?: No Does the patient have difficulty seeing, even when wearing glasses/contacts?: No Does the patient have difficulty concentrating, remembering, or making decisions?: No Does the patient have difficulty dressing or bathing?: Yes Does the patient have difficulty walking or climbing stairs?: Yes  Permission Sought/Granted Permission sought to share information with : Investment banker, corporate granted to share info w AGENCY: [facility]        Emotional Assessment Appearance:: Appears stated age     Orientation: : Oriented to Self, Oriented to Place, Oriented to  Time, Oriented to Situation Alcohol / Substance Use: Not Applicable Psych Involvement: No (comment)  Admission diagnosis:  HCAP (healthcare-associated pneumonia) [J18.9] Sepsis ([place]) [A41.9] Sepsis, due to unspecified organism, unspecified whether acute organ dysfunction present ([place]) [A41.9]  Patient Active Problem List   Diagnosis Date Noted   Sepsis ([place]) 11/30/2020   Depression    HCAP (healthcare-associated pneumonia)    Adrenal insufficiency due to cancer therapy ([place])    Protein-calorie malnutrition, severe 11/23/2020   Symptomatic anemia 11/21/2020   Hypokalemia 11/21/2020   Therapeutic opioid-induced constipation (OIC) 09/28/2020   Non-traumatic compression fracture of T2 thoracic vertebra, sequela 04/17/2020   Non-traumatic compression  fracture of T1 thoracic vertebra, sequela 04/17/2020   Non-traumatic compression fracture of T3 thoracic vertebra, sequela 04/17/2020   Chronic neuropathic pain 04/11/2020   Non compliance w medication regimen 03/09/2020   History of high risk medication treatment 03/02/2020   Medication care plan discussed with patient 03/02/2020   Pain medication agreement discussed 03/02/2020   Pain medication agreement signed 03/02/2020   Opiate use 03/02/2020   Physical tolerance to opiate drug 03/02/2020   Encounter for adjustment and management of infusion pump 02/29/2020   Presence of implanted infusion pump (Medtronic) 02/15/2020   Presence of intrathecal pump (Medtronic) 02/15/2020   DDD (degenerative disc disease), thoracic 07/12/2019   DDD (degenerative disc disease), lumbosacral 07/12/2019   Lumbar facet hypertrophy ([place]) 07/12/2019   Cancer-related pain 07/12/2019   Elevated sed rate 07/12/2019   Abdominal wall pain in epigastric region 06/15/2019   Thoracic radiculitis 06/15/2019   Right-sided low back pain without sciatica 06/15/2019   Bradycardia, unspecified 06/01/2019   Hypotension 06/01/2019   Chronic anticoagulation (Plavix) 05/19/2019   Vitamin D deficiency 05/19/2019   Chronic pain syndrome 05/03/2019   Pharmacologic therapy 05/03/2019   Disorder of skeletal system 05/03/2019   Problems influencing health status 05/03/2019   Chronic abdominal pain 05/03/2019   Elevated uric acid in blood 05/03/2019   Chronic prescription opiate use 05/03/2019   Enlarged prostate 31/49/7026   Folic acid deficiency 37/85/8850   Major depressive disorder, recurrent, mild ([place]) 03/08/2019   Shortness of breath 12/28/2018   Acute on chronic renal failure ([place]) 12/28/2018   SOB (shortness of breath) 12/28/2018   Portal hypertensive gastropathy ([place]) 09/25/2018   CKD stage 3 secondary to diabetes ([place]) 08/04/2018   Atherosclerosis of native arteries of the extremities with ulceration  ([place]) 08/04/2018   Stage 3 chronic kidney disease ([place]) 08/04/2018   Anemia due to stage 3 chronic kidney disease ([place]) 04/15/2018   Gastroesophageal reflux disease without esophagitis 08/22/2017   Chronic epigastric pain (1ry area of Pain) 08/21/2017   Hx of adenomatous colonic polyps 08/21/2017   Atherosclerosis of native arteries of extremity with rest pain ([place]) 05/13/2017   Hyperlipidemia 05/06/2017   Atherosclerotic peripheral vascular disease with intermittent claudication ([place]) 05/06/2017   Toe cyanosis 05/06/2017   AVM (arteriovenous malformation) of duodenum, acquired with hemorrhage 12/12/2016   Thrombocytopenia ([place]) 12/12/2016   Anemia 10/16/2016   Current smoker 10/16/2016   Malignant lymphoma, small lymphocytic ([place]) 03/25/2016   Generalized abdominal pain 12/15/2015   CLL (chronic lymphocytic leukemia) ([place]) 12/26/2013   Diabetes mellitus type 2, uncomplicated ([place]) 27/74/1287   Hypertension 12/26/2013   PCP:  [name], MD Pharmacy:   [facility] [address]), [place] [address] Phone: [phone] Fax: [place] [address] [place] [address] Phone: [phone] Fax: [phone]     Social Determinants of Health (SDOH) Interventions    Readmission Risk Interventions Readmission Risk Prevention Plan 12/01/2020  Transportation Screening Complete  Medication Review (RN Care Manager) Complete  PCP or Specialist  appointment within 3-5 days of discharge Complete  HRI or [place] Complete  SW Recovery Care/Counseling Consult Complete  [place] Not Applicable  Some recent data might be hidden

## 2020-12-01 NOTE — Evaluation (Signed)
Physical Therapy Evaluation Patient Details Name: James [name] MRN: [id number] DOB: 1950/06/16 Today's Date: 12/01/2020   History of Present Illness  Pt is a 70 y/o M admitted on 11/30/20 with c/c of weakness & fever. Pt was recently d/c from hospital 4 days prior after tx for symptomatic anemia. Pt found to have L lower lobe PNA. Pt is being treated for sepsis from HCAP. PMH: CLL on chemotherapy, opioid induced constipation, DM, depression, GERD  Clinical Impression  Pt seen for PT evaluation with pt demonstrating impaired cognition but reports he lives alone. Per chart pt also with hx of falls. Pt is able to ambulate 1 lap around nurses station with RW & supervision but does bump doorframe with AD when returning to room. Pt would benefit from 24 hr supervision upon d/c as pt has hx of falling & presents with impaired cognition. Will continue to follow pt acutely to progress gait with LRAD & address balance, endurance, & stair negotiation.     Follow Up Recommendations Home health PT;Supervision/Assistance - 24 hour    Equipment Recommendations  Rolling walker with 5" wheels    Recommendations for Other Services       Precautions / Restrictions Precautions Precautions: Fall Restrictions Weight Bearing Restrictions: No      Mobility  Bed Mobility Overal bed mobility: Needs Assistance Bed Mobility: Supine to Sit     Supine to sit: Modified independent (Device/Increase time);HOB elevated (with bed rails)          Transfers Overall transfer level: Needs assistance Equipment used: Rolling walker (2 wheeled) Transfers: Sit to/from Stand Sit to Stand: Supervision            Ambulation/Gait Ambulation/Gait assistance: Supervision Gait Distance (Feet): 175 Feet Assistive device: Rolling walker (2 wheeled) Gait Pattern/deviations: Decreased step length - right;Decreased step length - left;Decreased stride length Gait velocity: slightly decreased   General Gait Details:  Pt requiring cuing for pathfinding around nurses station as pt will veer down corridors. Pt occaisonally bumping doorframe when entering room.  Stairs            Wheelchair Mobility    Modified Rankin (Stroke Patients Only)       Balance Overall balance assessment: Needs assistance Sitting-balance support: Feet supported;Bilateral upper extremity supported Sitting balance-Leahy Scale: Good     Standing balance support: Bilateral upper extremity supported Standing balance-Leahy Scale: Fair                               Pertinent Vitals/Pain Pain Assessment: No/denies pain    Home Living Family/patient expects to be discharged to:: Private residence Living Arrangements: Alone Available Help at Discharge: Friend(s);Available PRN/intermittently;Neighbor Type of Home: House Home Access: Stairs to enter Entrance Stairs-Rails: Right;Left (wideset) Entrance Stairs-Number of Steps: 2 Home Layout: One level   Additional Comments: rollator    Prior Function Level of Independence: Independent with assistive device(s)         Comments: Ambulatory with rollator, hx of falls, reports he drives to store & back (1.5 miles down the road) otherwise he doesn't drive.     Hand Dominance        Extremity/Trunk Assessment   Upper Extremity Assessment Upper Extremity Assessment: Overall WFL for tasks assessed    Lower Extremity Assessment Lower Extremity Assessment: Generalized weakness       Communication   Communication:  (unsure if pt has poor memory or word finding issues)  Cognition Arousal/Alertness:  Awake/alert Behavior During Therapy: WFL for tasks assessed/performed Overall Cognitive Status: Difficult to assess                                 General Comments: Unsure if pt has word finding issues or impaired cognition. Pt oriented to hospital & month but not year nor situation. Pt also with difficulty recalling the year he was  born.      General Comments General comments (skin integrity, edema, etc.): denies SOB during mobility    Exercises     Assessment/Plan    PT Assessment Patient needs continued PT services  PT Problem List Decreased activity tolerance;Decreased balance;Decreased knowledge of use of DME;Decreased mobility;Decreased safety awareness;Decreased cognition       PT Treatment Interventions DME instruction;Gait training;Functional mobility training;Therapeutic activities;Therapeutic exercise;Balance training;Stair training;Cognitive remediation;Patient/family education;Neuromuscular re-education    PT Goals (Current goals can be found in the Care Plan section)  Acute Rehab PT Goals Patient Stated Goal: none stated PT Goal Formulation: With patient Time For Goal Achievement: 12/15/20 Potential to Achieve Goals: Fair    Frequency Min 2X/week   Barriers to discharge Decreased caregiver support      Co-evaluation               AM-PAC PT "6 Clicks" Mobility  Outcome Measure Help needed turning from your back to your side while in a flat bed without using bedrails?: None Help needed moving from lying on your back to sitting on the side of a flat bed without using bedrails?: A Little Help needed moving to and from a bed to a chair (including a wheelchair)?: A Little Help needed standing up from a chair using your arms (e.g., wheelchair or bedside chair)?: A Little Help needed to walk in hospital room?: A Little Help needed climbing 3-5 steps with a railing? : A Little 6 Click Score: 19    End of Session   Activity Tolerance: Patient tolerated treatment well Patient left: with chair alarm set;in chair;with call bell/phone within reach Nurse Communication: Mobility status PT Visit Diagnosis: Unsteadiness on feet (R26.81);History of falling (Z91.81)    Time: 1761-6073 PT Time Calculation (min) (ACUTE ONLY): 14 min   Charges:   PT Evaluation $PT Eval Low Complexity: [place], PT, DPT 12/01/20, 3:51 PM   Waunita Schooner 12/01/2020, 3:49 PM

## 2020-12-02 ENCOUNTER — Telehealth: Payer: Self-pay | Admitting: Internal Medicine

## 2020-12-02 DIAGNOSIS — D631 Anemia in chronic kidney disease: Secondary | ICD-10-CM

## 2020-12-02 DIAGNOSIS — J189 Pneumonia, unspecified organism: Secondary | ICD-10-CM | POA: Diagnosis not present

## 2020-12-02 DIAGNOSIS — A419 Sepsis, unspecified organism: Secondary | ICD-10-CM | POA: Diagnosis not present

## 2020-12-02 DIAGNOSIS — I10 Essential (primary) hypertension: Secondary | ICD-10-CM

## 2020-12-02 DIAGNOSIS — G894 Chronic pain syndrome: Secondary | ICD-10-CM

## 2020-12-02 DIAGNOSIS — N183 Chronic kidney disease, stage 3 unspecified: Secondary | ICD-10-CM | POA: Diagnosis not present

## 2020-12-02 DIAGNOSIS — E273 Drug-induced adrenocortical insufficiency: Secondary | ICD-10-CM | POA: Diagnosis not present

## 2020-12-02 LAB — CBC WITH DIFFERENTIAL/PLATELET
Abs Immature Granulocytes: 0.28 10*3/uL — ABNORMAL HIGH (ref 0.00–0.07)
Basophils Absolute: 0 10*3/uL (ref 0.0–0.1)
Basophils Relative: 0 %
Eosinophils Absolute: 0 10*3/uL (ref 0.0–0.5)
Eosinophils Relative: 0 %
HCT: 22.1 % — ABNORMAL LOW (ref 39.0–52.0)
Hemoglobin: 7.1 g/dL — ABNORMAL LOW (ref 13.0–17.0)
Immature Granulocytes: 2 %
Lymphocytes Relative: 39 %
Lymphs Abs: 5.1 10*3/uL — ABNORMAL HIGH (ref 0.7–4.0)
MCH: 33.2 pg (ref 26.0–34.0)
MCHC: 32.1 g/dL (ref 30.0–36.0)
MCV: 103.3 fL — ABNORMAL HIGH (ref 80.0–100.0)
Monocytes Absolute: 0.3 10*3/uL (ref 0.1–1.0)
Monocytes Relative: 3 %
Neutro Abs: 7.3 10*3/uL (ref 1.7–7.7)
Neutrophils Relative %: 56 %
Platelets: 56 10*3/uL — ABNORMAL LOW (ref 150–400)
RBC: 2.14 MIL/uL — ABNORMAL LOW (ref 4.22–5.81)
RDW: 21.1 % — ABNORMAL HIGH (ref 11.5–15.5)
Smear Review: NORMAL
WBC: 13.1 10*3/uL — ABNORMAL HIGH (ref 4.0–10.5)
nRBC: 0 % (ref 0.0–0.2)

## 2020-12-02 LAB — URINE CULTURE: Culture: NO GROWTH

## 2020-12-02 LAB — BASIC METABOLIC PANEL
Anion gap: 8 (ref 5–15)
BUN: 27 mg/dL — ABNORMAL HIGH (ref 8–23)
CO2: 26 mmol/L (ref 22–32)
Calcium: 8.2 mg/dL — ABNORMAL LOW (ref 8.9–10.3)
Chloride: 105 mmol/L (ref 98–111)
Creatinine, Ser: 1.13 mg/dL (ref 0.61–1.24)
GFR, Estimated: >60 mL/min (ref >60)
Glucose, Bld: 147 mg/dL — ABNORMAL HIGH (ref 70–99)
Potassium: 3.9 mmol/L (ref 3.5–5.1)
Sodium: 139 mmol/L (ref 135–145)

## 2020-12-02 LAB — GLUCOSE, CAPILLARY
Glucose-Capillary: 122 mg/dL — ABNORMAL HIGH (ref 70–99)
Glucose-Capillary: 116 mg/dL — ABNORMAL HIGH (ref 70–99)
Glucose-Capillary: 111 mg/dL — ABNORMAL HIGH (ref 70–99)
Glucose-Capillary: 121 mg/dL — ABNORMAL HIGH (ref 70–99)

## 2020-12-02 LAB — PREPARE RBC (CROSSMATCH)

## 2020-12-02 MED ORDER — SODIUM CHLORIDE 0.9% IV SOLUTION: Freq: Once | INTRAVENOUS | Status: AC

## 2020-12-02 MED ORDER — SODIUM CHLORIDE 0.9 % IV SOLN
200.0000 mg | INTRAVENOUS | Status: AC
  Administered 2020-12-02 – 2020-12-04 (×3): 200 mg via INTRAVENOUS
  Filled 2020-12-02 (×3): qty 10

## 2020-12-02 NOTE — Plan of Care (Signed)

## 2020-12-02 NOTE — Assessment & Plan Note (Addendum)
#  70 year old male patient with a history of CLL/relapsed bone marrow/11 P deletion 7 q. Deletion/ chronic abdominal pain of unclear etiology-s/p pain pump; question adrenal insufficiency is currently admitted to hospital for worsening generalized weakness-CT scan abdomen pelvis shows left lower lobe pneumonia  #CLL-most recently on ibrutinib.  Recommend discontinuation of ibrutinib given acute infection [see below].  Will consider alternate tyrosine kinase inhibitor-given patient's   #Infectious disease: Left lower lobe pneumonia-on broad-spectrum antibiotics; staph epidermis bacteremia-[recent pain pump access]-thought to be contaminant.  Defer to ID for antibiotic recommendations.  #Symptomatic severe anemia-multifactorial; suspect predominant CLL/SLL involving the bone marrow BX April 2022 bone marrow biopsy. July 2022- EGD shows multiple AV malformations stomach status post argon laser.  Agree with holding aspirin Plavix [see below].  S/p iron infusions x3.  #Abdominal pain chronic-s/p evaluation with pain clinic s/p pain pump; question adrenal insufficiency currently on hydrocortisone-recommend home dose of hydrocortisone at discharge.  # DNR/DNI  Disposition: Patient is clinically stable from medical oncology standpoint.  We will make follow-up appointments in the next couple of weeks. Discussed with Dr.Patel.

## 2020-12-02 NOTE — Telephone Encounter (Signed)
I spoke to patient's son Duanne Duchesne him of patient's ongoing issues/admission to hospital.  Reminded the son that unfortunately overall prognosis is guarded.  However we will continue current care.    A/J-please look into Acalabrutinib-given patient's AV malformation/GI bleed while on Ibrutinib.  Patient is currently admitted to hospital.  Thanks GB

## 2020-12-02 NOTE — Consult Note (Signed)
[place] CONSULT NOTE  Patient Care Team: [name], MD as PCP - General (Internal Medicine) Cammie Sickle, MD as Consulting Physician (Hematology and Oncology) Borders, Kirt Boys, NP as Nurse Practitioner (Hospice and Palliative Medicine) Verlon Au, NP as Nurse Practitioner (Hematology and Oncology) Jacquelin Hawking, NP as Nurse Practitioner (Oncology) Lonell Face, NP as Nurse Practitioner (Neurosurgery) Milinda Pointer, MD as Consulting Physician (Pain Medicine)  CHIEF COMPLAINTS/PURPOSE OF CONSULTATION: CLL/anemia  HISTORY OF PRESENTING ILLNESS:  James [name] 70 y.o.  male with a history of CLL on ibrutinib; severe anemia chronic abdominal pain; question adrenal insufficiency on hydrocortisone is currently admitted to hospital for generalized weakness/chills difficulty breathing on exertion.  Patient was recently admitted to hospital for anemia noted to have AV malformations status post EGD.  Patient discharged for blood transfusion.  However patient since discharge continues to complain of generalized weakness intermittent low-grade fevers.  Complains of worsening shortness of breath.   A CT scan abdomen pelvis showed-left lower lung pneumonia/infiltrate.  Patient admitted hospital for further work-up/treatment.   Review of Systems  Constitutional:  Positive for chills, fever, malaise/fatigue and weight loss. Negative for diaphoresis.  HENT:  Negative for nosebleeds and sore throat.   Eyes:  Negative for double vision.  Respiratory:  Positive for shortness of breath. Negative for cough, hemoptysis, sputum production and wheezing.   Cardiovascular:  Negative for chest pain, palpitations, orthopnea and leg swelling.  Gastrointestinal:  Positive for abdominal pain and nausea. Negative for blood in stool, constipation, diarrhea, heartburn, melena and vomiting.  Genitourinary:  Negative for dysuria, frequency and urgency.   Musculoskeletal:  Positive for back pain and joint pain.  Skin: Negative.  Negative for itching and rash.  Neurological:  Positive for weakness. Negative for dizziness, tingling, focal weakness and headaches.  Endo/Heme/Allergies:  Bruises/bleeds easily.  Psychiatric/Behavioral:  Negative for depression. The patient is not nervous/anxious and does not have insomnia.     MEDICAL HISTORY:  Past Medical History:  Diagnosis Date   CLL (chronic lymphocytic leukemia) ([place])    Constipation    Depression    Diabetes mellitus without complication (HCC)    GERD (gastroesophageal reflux disease)    Hematuria    Hyperlipidemia    Hypertension    Therapeutic opioid induced constipation     SURGICAL HISTORY: Past Surgical History:  Procedure Laterality Date   CATARACT EXTRACTION W/ INTRAOCULAR LENS IMPLANT Bilateral    CHOLECYSTECTOMY  1983   COLONOSCOPY WITH PROPOFOL N/A 10/27/2017   Procedure: COLONOSCOPY WITH PROPOFOL;  Surgeon: Manya Silvas, MD;  Location: [facility] ENDOSCOPY;  Service: Endoscopy;  Laterality: N/A;   ESOPHAGOGASTRODUODENOSCOPY (EGD) WITH PROPOFOL N/A 11/20/2016   Procedure: ESOPHAGOGASTRODUODENOSCOPY (EGD) WITH PROPOFOL;  Surgeon: Manya Silvas, MD;  Location: [facility] ENDOSCOPY;  Service: Endoscopy;  Laterality: N/A;   ESOPHAGOGASTRODUODENOSCOPY (EGD) WITH PROPOFOL N/A 10/27/2017   Procedure: ESOPHAGOGASTRODUODENOSCOPY (EGD) WITH PROPOFOL;  Surgeon: Manya Silvas, MD;  Location: [facility] ENDOSCOPY;  Service: Endoscopy;  Laterality: N/A;   ESOPHAGOGASTRODUODENOSCOPY (EGD) WITH PROPOFOL N/A 11/23/2020   Procedure: ESOPHAGOGASTRODUODENOSCOPY (EGD) WITH PROPOFOL;  Surgeon: Jonathon Bellows, MD;  Location: [facility] ENDOSCOPY;  Service: Gastroenterology;  Laterality: N/A;   INTRATHECAL PUMP IMPLANT Left 02/07/2020   Procedure: INTRATHECAL PUMP & CATHETER IMPLANT;  Surgeon: Deetta Perla, MD;  Location: [facility] ORS;  Service: Neurosurgery;  Laterality: Left;   LOWER EXTREMITY ANGIOGRAPHY Right  05/19/2017   Procedure: LOWER EXTREMITY ANGIOGRAPHY;  Surgeon: Algernon Huxley, MD;  Location: [place] CV LAB;  Service: Cardiovascular;  Laterality: Right;   LOWER EXTREMITY ANGIOGRAPHY Left 08/10/2018   Procedure: LOWER EXTREMITY ANGIOGRAPHY;  Surgeon: Algernon Huxley, MD;  Location: [place] CV LAB;  Service: Cardiovascular;  Laterality: Left;    SOCIAL HISTORY: Social History   Socioeconomic History   Marital status: Married    Spouse name: James [name]   Number of children: 1   Years of education: Not on file   Highest education level: Not on file  Occupational History   Occupation: retired    Comment: truck driver  Tobacco Use   Smoking status: Every Day    Packs/day: 0.50    Years: 47.00    Pack years: 23.50    Types: Cigarettes   Smokeless tobacco: Never  Vaping Use   Vaping Use: Never used  Substance and Sexual Activity   Alcohol use: Yes    Alcohol/week: 1.0 standard drink    Types: 1 Glasses of wine per week    Comment:  almost none in last 6 months   Drug use: No   Sexual activity: Never    Partners: Female  Other Topics Concern   Not on file  Social History Narrative   Not on file   Social Determinants of Health   Financial Resource Strain: Not on file  Food Insecurity: Not on file  Transportation Needs: Not on file  Physical Activity: Not on file  Stress: Not on file  Social Connections: Not on file  Intimate Partner Violence: Not on file    FAMILY HISTORY: Family History  Problem Relation Age of Onset   Hypertension Sister     ALLERGIES:  has No Known Allergies.  MEDICATIONS:  Current Facility-Administered Medications  Medication Dose Route Frequency Provider Last Rate Last Admin   acetaminophen (TYLENOL) tablet 650 mg  650 mg Oral Q6H PRN Agbata, Tochukwu, MD   650 mg at 11/30/20 1956   Or   acetaminophen (TYLENOL) suppository 650 mg  650 mg Rectal Q6H PRN Agbata, Tochukwu, MD       azithromycin (ZITHROMAX) tablet 500 mg  500 mg Oral  Daily Wieting, Richard, MD   500 mg at 12/01/20 1225   cefTRIAXone (ROCEPHIN) 2 g in sodium chloride 0.9 % 100 mL IVPB  2 g Intravenous Q24H Loletha Grayer, MD   Stopped at 12/01/20 1802   feeding supplement (BOOST / RESOURCE BREEZE) liquid 1 Container  1 Container Oral TID BM Wieting, Richard, MD       feeding supplement (KATE FARMS STANDARD 1.4) liquid 325 mL  325 mL Oral TID BM Wieting, Richard, MD   325 mL at 94/85/46 2703   folic acid (FOLVITE) tablet 1 mg  1 mg Oral Daily Agbata, Tochukwu, MD   1 mg at 12/01/20 0926   gabapentin (NEURONTIN) capsule 200 mg  200 mg Oral BID Agbata, Tochukwu, MD   200 mg at 12/01/20 2130   hydrocortisone sodium succinate (SOLU-CORTEF) 100 MG injection 50 mg  50 mg Intravenous Q12H Wieting, Richard, MD   50 mg at 12/01/20 2134   ibrutinib TABS 420 mg  420 mg Oral Daily Agbata, Tochukwu, MD       insulin aspart (novoLOG) injection 0-9 Units  0-9 Units Subcutaneous TID WC Agbata, Tochukwu, MD   1 Units at 12/01/20 1225   multivitamin with minerals tablet 1 tablet  1 tablet Oral Daily Agbata, Tochukwu, MD   1 tablet at 12/01/20 0926   naloxegol oxalate (MOVANTIK) tablet 12.5 mg  12.5 mg Oral Daily Agbata, Tochukwu,  MD   12.5 mg at 12/01/20 0926   nicotine (NICODERM CQ - dosed in mg/24 hours) patch 14 mg  14 mg Transdermal Daily Agbata, Tochukwu, MD   14 mg at 12/01/20 0926   ondansetron (ZOFRAN) tablet 4 mg  4 mg Oral Q6H PRN Agbata, Tochukwu, MD       Or   ondansetron (ZOFRAN) injection 4 mg  4 mg Intravenous Q6H PRN Agbata, Tochukwu, MD       oxyCODONE (Oxy IR/ROXICODONE) immediate release tablet 10 mg  10 mg Oral Q6H PRN Athena Masse, MD   10 mg at 11/30/20 2028   pantoprazole (PROTONIX) EC tablet 40 mg  40 mg Oral QHS Agbata, Tochukwu, MD   40 mg at 12/01/20 2130      .  PHYSICAL EXAMINATION:  Vitals:   12/02/20 0412 12/02/20 0738  BP: 121/77 123/74  Pulse: 77 64  Resp: 20 16  Temp: 97.6 F (36.4 C) 98 F (36.7 C)  SpO2: 99% 96%   Filed  Weights   11/30/20 1115  Weight: 130 lb 1.1 oz (59 kg)    Physical Exam Vitals and nursing note reviewed.  Constitutional:      Comments: Patient resting in the bed; Alone.  Thin cachectic appearing male patient  HENT:     Head: Normocephalic and atraumatic.     Mouth/Throat:     Mouth: Mucous membranes are moist.     Pharynx: No oropharyngeal exudate.  Eyes:     Pupils: Pupils are equal, round, and reactive to light.  Cardiovascular:     Rate and Rhythm: Normal rate and regular rhythm.  Pulmonary:     Effort: No respiratory distress.     Breath sounds: No wheezing.     Comments: Decreased breath sounds bilaterally at bases.  No wheeze or crackles Abdominal:     General: Bowel sounds are normal. There is no distension.     Palpations: Abdomen is soft. There is no mass.     Tenderness: There is no abdominal tenderness. There is no guarding or rebound.  Musculoskeletal:        General: No tenderness. Normal range of motion.     Cervical back: Normal range of motion and neck supple.  Skin:    General: Skin is warm.  Neurological:     Mental Status: He is alert and oriented to person, place, and time.  Psychiatric:        Mood and Affect: Affect normal.        Judgment: Judgment normal.     LABORATORY DATA:  I have reviewed the data as listed Lab Results  Component Value Date   WBC 13.1 (H) 12/02/2020   HGB 7.1 (L) 12/02/2020   HCT 22.1 (L) 12/02/2020   MCV 103.3 (H) 12/02/2020   PLT 56 (L) 12/02/2020   Recent Labs    12/07/19 0812 01/04/20 0932 01/24/20 1156 02/01/20 0940 11/17/20 2154 11/21/20 1400 11/22/20 0441 11/30/20 1133 11/30/20 1432 12/01/20 0443 12/02/20 0503  NA 139 139 138   < > 141 136   < > 144  --  139 139  K 5.2* 4.9 6.2*   < > 4.4 3.3*   < > 4.0  --  3.7 3.9  CL 103 102 102   < > 115* 105   < > 108  --  106 105  CO2 _0 < > 20* 22   < > 26  --  27 26  GLUCOSE 166* 163* 160*   < > 113* 101*   < > 149*  --  150* 147*  BUN 27* 36*  37*   < > 33* 49*   < > 22  --  27* 27*  CREATININE 1.12 1.16 1.47*   < > 1.07 1.58*   < > 1.23  --  1.09 1.13  CALCIUM 9.3 9.2 9.2   < > 7.3* 8.1*   < > 8.5*  --  8.0* 8.2*  GFRNONAA >60 >60 48*   < > >60 46*   < > >60  --  >60 >60  GFRAA >60 >60 55*  --   --   --   --   --   --   --   --   PROT 7.0  --   --    < > 4.9* 5.5*  --   --  6.3*  --   --   ALBUMIN 4.3  --   --    < > 3.2* 3.7  --   --  3.7  --   --   AST 12*  --   --    < > 20 12*  --   --  21  --   --   ALT 11  --   --    < > 15 14  --   --  12  --   --   ALKPHOS 63  --   --    < > 40 51  --   --  71  --   --   BILITOT 0.4  --   --    < > 1.5* 2.2*  --   --  2.6*  --   --   BILIDIR  --   --   --   --   --   --   --   --  0.9*  --   --   IBILI  --   --   --   --   --   --   --   --  1.7*  --   --    < > = values in this interval not displayed.    RADIOGRAPHIC STUDIES: I have personally reviewed the radiological images as listed and agreed with the findings in the report. CT ABDOMEN PELVIS W CONTRAST  Result Date: 11/30/2020 CLINICAL DATA:  Epigastric pain for 5 days.  Generalized weakness EXAM: CT ABDOMEN AND PELVIS WITH CONTRAST TECHNIQUE: Multidetector CT imaging of the abdomen and pelvis was performed using the standard protocol following bolus administration of intravenous contrast. CONTRAST:  41m OMNIPAQUE IOHEXOL 350 MG/ML SOLN COMPARISON:  11/21/2020 FINDINGS: Lower chest: New mixed density airspace opacity within the left lower lobe. Right lung bases clear. Heart size is normal. Hepatobiliary: Status post cholecystectomy. Similar degree of intrahepatic biliary dilatation. No focal liver lesion is identified. Pancreas: Unremarkable. No pancreatic ductal dilatation or surrounding inflammatory changes. Spleen: Spleen is upper limits of normal in size without focal lesion. Adrenals/Urinary Tract: Unremarkable adrenal glands. Stable bilateral renal cysts. Two tiny right-sided renal stones without hydronephrosis. No left-sided renal  stone or hydronephrosis. Urinary bladder is mildly distended but appears otherwise unremarkable. Stomach/Bowel: Stomach is within normal limits. Appendix appears normal. No evidence of bowel wall thickening, distention, or inflammatory changes. Vascular/Lymphatic: Advanced atherosclerosis throughout the aortoiliac axis with bi-iliac stents. No aneurysm. Similar degree of retroperitoneal lymphadenopathy. Reference left para-aortic nodal mass measuring approximately 4.3 x 2.4 cm (series 3,  image 36), unchanged. Reproductive: Prostatomegaly. Other: No free fluid. No abdominopelvic fluid collection. No pneumoperitoneum. No abdominal wall hernia. Musculoskeletal: No acute or significant osseous findings. Neurostimulator is again seen entering at the L3-4 level and extending into the thoracic region, beyond the field of view. IMPRESSION: 1. New airspace opacity within the left lower lobe compatible with pneumonia. 2. Stable retroperitoneal lymphadenopathy. 3. Nonobstructing right nephrolithiasis. 4. Prostatomegaly. 5. Aortic atherosclerosis (ICD10-I70.0). Electronically Signed   By: Davina Poke D.O.   On: 11/30/2020 15:31   CT ABDOMEN PELVIS W CONTRAST  Result Date: 11/21/2020 CLINICAL DATA:  Acute nonlocalized epigastric pain. Weakness and lethargy. Question perforation. Being treated for leukemia. EXAM: CT ABDOMEN AND PELVIS WITH CONTRAST TECHNIQUE: Multidetector CT imaging of the abdomen and pelvis was performed using the standard protocol following bolus administration of intravenous contrast. CONTRAST:  76m OMNIPAQUE IOHEXOL 350 MG/ML SOLN COMPARISON:  01/24/2020 FINDINGS: Lower chest: Lung bases are clear.  No pleural or pericardial fluid. Hepatobiliary: Previous cholecystectomy. No significant liver parenchymal finding. Mild ductal prominence, within normal limits following cholecystectomy. Pancreas: Normal Spleen: Spleen upper limits of normal in size.  No focal lesion. Adrenals/Urinary Tract: Adrenal  glands are normal. There are bilateral renal cysts. No evidence of mass or hydronephrosis. One or 2 punctate nonobstructing stones in the lower pole of the right kidney. Stomach/Bowel: Stomach is normal. Small bowel is normal. Moderate amount of gas and fecal matter in the colon without evidence of inflammatory disease or frank obstruction. Normal appearing appendix. Vascular/Lymphatic: Aortic atherosclerosis. No aneurysm. IVC is normal. Retroperitoneal adenopathy is similar to the previous study, the largest nodal mass on the left at the level of the kidney measuring 4.2 x 2.8 cm. Other retroperitoneal nodes similarly stable. Reproductive: Enlarged prostate. Other: No free fluid or air. Musculoskeletal: No fracture or focal bone lesion. Neurostimulator in place, entering at the L3-4 level and extending into the thoracic region. IMPRESSION: No evidence of acute bowel pathology. No evidence of perforation as questioned clinically. Moderate amount of gas and fecal matter in the colon, possibly due to opioid use. Spleen upper limits of normal in size. Retroperitoneal lymphadenopathy, similar to the study of September 2021. Enlarged prostate. Aortic Atherosclerosis (ICD10-I70.0). Electronically Signed   By: MNelson ChimesM.D.   On: 11/21/2020 15:26   DG Chest Port 1 View  Result Date: 11/30/2020 CLINICAL DATA:  Questionable sepsis. EXAM: PORTABLE CHEST 1 VIEW COMPARISON:  Chest x-ray 11/21/2020, CT chest 01/24/2020 FINDINGS: The heart size and mediastinal contours are unchanged. Aortic calcification. Interval development of patchy airspace opacity of the left lower lobe. No pulmonary edema. No pleural effusion. No pneumothorax. No acute osseous abnormality. Degenerative changes of bilateral acromioclavicular joints. IMPRESSION: Left lower lobe pneumonia/aspiration pneumonia Followup PA and lateral chest X-ray is recommended in 3-4 weeks following therapy to ensure resolution and exclude underlying malignancy.  Electronically Signed   By: MIven FinnM.D.   On: 11/30/2020 15:34   DG Chest Port 1 View  Result Date: 11/21/2020 CLINICAL DATA:  Questionable sepsis. Weakness. Lethargy. Being treated for leukemia. EXAM: PORTABLE CHEST 1 VIEW COMPARISON:  09/12/2019 FINDINGS: The heart size and mediastinal contours are within normal limits. Ordinary age related aortic atherosclerotic calcification. Both lungs are clear. The visualized skeletal structures are unremarkable. IMPRESSION: No active disease. Electronically Signed   By: MNelson ChimesM.D.   On: 11/21/2020 15:20    CLL (chronic lymphocytic leukemia) ([place] #70year old male patient with a history of CLL/relapsed bone marrow/11 P deletion 7 q. Deletion/ chronic abdominal  pain of unclear etiology-s/p pain pump; question adrenal insufficiency is currently admitted to hospital for worsening generalized weakness-CT scan abdomen pelvis shows left lower lobe pneumonia  #CLL-most recently on ibrutinib.  Recommend discontinuation of ibrutinib given acute infection [see below].  May need to consider alternate tyrosine kinase inhibitor down the line.  #Infectious disease: Left lower lobe pneumonia-on broad-spectrum antibiotics; staph epidermis bacteremia-[recent pain pump access]-on antibiotics per ID.  Although clinically less likely-it is to note that there is a high risk of fungal infections secondary to ibrutinib/underlying immunosuppressed state from CLL.   #Symptomatic severe anemia-multifactorial; suspect predominant CLL/SLL involving the bone marrow BX April 2022 bone marrow biopsy. July 2022- EGD shows multiple AV malformations stomach status post argon laser.  Agree with holding aspirin Plavix [see below].  Order IV iron infusion.  Transfuse PRBC to keep hemoglobin above 7-8.   #Abdominal pain chronic-s/p evaluation with pain clinic s/p pain pump; question adrenal insufficiency currently on hydrocortisone stress dose of steroids.  # DNR/DNI  Thank you  Dr. Leslye Peer for allowing me to participate in the care of your pleasant patient. Please do not hesitate to contact me with questions or concerns in the interim.  # I reviewed the blood work- with the patient in detail; also reviewed the imaging independently [as summarized above]; and with the patient in detail.   All questions were answered. The patient knows to call the clinic with any problems, questions or concerns.    Cammie Sickle, MD 12/02/2020 7:53 AM

## 2020-12-02 NOTE — Progress Notes (Signed)
[name], male   DOB: 1950/07/07, 70 y.o.   MRN: [id number] [facility] PROGRESS NOTE  James [name] QMV:[id number] DOB: 10-22-1950 DOA: 11/30/2020 PCP: [name], MD  Brief history: Patient admitted on 11/30/2020 with weakness and fever.  Past medical history of CLL on chemotherapy, opioid-induced constipation, chronic abdominal pain, adrenal insufficiency, anemia of chronic disease.  Patient was found to have clinical sepsis and left lower lobar pneumonia and started on antibiotics.  I changed antibiotics to Rocephin and Zithromax.  Blood culture grew out staph epidermidis.  I repeated blood cultures.  Since the patient has a chronic intrathecal pain pump I did get infectious disease specialist to see the patient.  Started on stress steroids for the cause of his history of adrenal insufficiency.  Ordered 1 unit of PRBC due to hemoglobin of 7.1 today.  Patient also has intrathecal pain pump in place.  Recently had GI bleed.  Oncology is thinking about changing ibrutinib therapy.  Assessment/Plan:  Clinical sepsis, present on admission with left lobar pneumonia.  Fever of 102.7, tachycardia, lactic acidosis and leukocytosis.  Patient was started on meropenem and vancomycin by admitting physician.  Case discussed with infectious disease specialist and pharmacist and will switch antibiotics to Rocephin and Zithromax. Staph epidermidis growing in 2 blood culture bottles.  Unfortunately they only sent 1 set from the ER.  Unsure if this is a real infection or contamination.  Repeat blood cultures remain negative so far.  Antibiotic should cover.  Since the patient does have an intrathecal pump I will get infectious disease specialist to see patient. Chronic abdominal pain on intrathecal pump. Adrenal insufficiency switch Cortef over the hydrocortisone for right now. CLL on ibrutinib.  Case discussed with Dr. Rogue Bussing hematology Anemia of chronic disease with recent GI bleed  (acute blood loss anemia) and cauterization of angiodysplasias.  Hemoglobin at 7.1 today.  Ordered 1 unit of PRBC.  No obvious bleeding at this time.  HPI/Subjective: Patient was seen and examined today.  No new complaints but continues to feel very weak and lethargic.  Denies any obvious bleeding.  Continue to have some cough and shortness of breath.  Objective: Vitals:   12/02/20 1117 12/02/20 1138  BP: 125/72 123/70  Pulse: 77 75  Resp: 16 16  Temp: 98.6 F (37 C) 98.1 F (36.7 C)  SpO2: 97% 98%    Intake/Output Summary (Last 24 hours) at 12/02/2020 1510 Last data filed at 12/02/2020 1123 Gross per 24 hour  Intake 700 ml  Output --  Net 700 ml    Filed Weights   11/30/20 1115  Weight: 59 kg   Exam: General.  Chronically ill-appearing, lethargic elderly man, in no acute distress. Pulmonary.  Lungs clear bilaterally, normal respiratory effort. CV.  Regular rate and rhythm, no JVD, rub or murmur. Abdomen.  Soft, nontender, nondistended, BS positive. CNS.  Alert and oriented x3.  No focal neurologic deficit. Extremities.  No edema, no cyanosis, pulses intact and symmetrical. Psychiatry.  Judgment and insight appears impaired.  Data Reviewed: Basic Metabolic Panel: Recent Labs  Lab 11/30/20 1133 12/01/20 0443 12/02/20 0503  NA 144 139 139  K 4.0 3.7 3.9  CL 108 106 105  CO2 26 27 26   GLUCOSE 149* 150* 147*  BUN 22 27* 27*  CREATININE 1.23 1.09 1.13  CALCIUM 8.5* 8.0* 8.2*    Liver Function Tests: Recent Labs  Lab 11/30/20 1432  AST 21  ALT 12  ALKPHOS 71  BILITOT 2.6*  PROT 6.3*  ALBUMIN 3.7    Recent Labs  Lab 11/30/20 1432  LIPASE 20    CBC: Recent Labs  Lab 11/30/20 1133 12/01/20 0443 12/02/20 0503  WBC 16.0* 10.4 13.1*  NEUTROABS  --   --  7.3  HGB 9.1* 7.3* 7.1*  HCT 27.2* 22.3* 22.1*  MCV 102.3* 102.3* 103.3*  PLT 80* 59* 56*    CBG: Recent Labs  Lab 12/01/20 1157 12/01/20 1610 12/01/20 2134 12/02/20 0738 12/02/20 1151   GLUCAP 130* 114* 155* 122* 116*     Recent Results (from the past 240 hour(s))  Blood Culture (routine x 2)     Status: None (Preliminary result)   Collection Time: 11/30/20  2:32 PM   Specimen: BLOOD  Result Value Ref Range Status   Specimen Description   Final    BLOOD LEFT ANTECUBITAL Performed at [facility], [address] [place], [place] [address]    Special Requests   Final    BOTTLES DRAWN AEROBIC AND ANAEROBIC Blood Culture adequate volume Performed at [facility], [address] [place], [place] [address]    Culture  Setup Time   Final    Organism ID to follow Pump Back CRITICAL RESULT CALLED TO, READ BACK BY AND VERIFIED WITH: CHARLES SHANLEVER AT 1010 12/01/20.PMF Performed at [facility], [address] [place], [place] [address]    Culture   Final    Lonell Grandchild POSITIVE COCCI CULTURE REINCUBATED FOR BETTER GROWTH Performed at [facility], [place] [address], [place], [place] [address]    Report Status PENDING  Incomplete  Resp Panel by RT-PCR (Flu A&B, Covid) Nasopharyngeal Swab     Status: None   Collection Time: 11/30/20  2:32 PM   Specimen: Nasopharyngeal Swab; Nasopharyngeal(NP) swabs in vial transport medium  Result Value Ref Range Status   SARS Coronavirus 2 by RT PCR NEGATIVE NEGATIVE Final    Comment: (NOTE) SARS-CoV-2 target nucleic acids are NOT DETECTED.  The SARS-CoV-2 RNA is generally detectable in upper respiratory specimens during the acute phase of infection. The lowest concentration of SARS-CoV-2 viral copies this assay can detect is 138 copies/mL. A negative result does not preclude SARS-Cov-2 infection and should not be used as the sole basis for treatment or other patient management decisions. A negative result may occur with  improper specimen collection/handling, submission of specimen other than nasopharyngeal swab, presence of viral mutation(s) within  the areas targeted by this assay, and inadequate number of viral copies(<138 copies/mL). A negative result must be combined with clinical observations, patient history, and epidemiological information. The expected result is Negative.  Fact Sheet for Patients:  EntrepreneurPulse.com.au  Fact Sheet for Healthcare Providers:  IncredibleEmployment.be  This test is no t yet approved or cleared by the [place] FDA and  has been authorized for detection and/or diagnosis of SARS-CoV-2 by FDA under an Emergency Use Authorization (EUA). This EUA will remain  in effect (meaning this test can be used) for the duration of the COVID-19 declaration under Section 564(b)(1) of the Act, 21 U.S.C.section 360bbb-3(b)(1), unless the authorization is terminated  or revoked sooner.       Influenza A by PCR NEGATIVE NEGATIVE Final   Influenza B by PCR NEGATIVE NEGATIVE Final    Comment: (NOTE) The Xpert Xpress SARS-CoV-2/FLU/RSV plus assay is intended as an aid in the diagnosis of influenza from Nasopharyngeal swab specimens and should not be used as a sole basis  for treatment. Nasal washings and aspirates are unacceptable for Xpert Xpress SARS-CoV-2/FLU/RSV testing.  Fact Sheet for Patients: EntrepreneurPulse.com.au  Fact Sheet for Healthcare Providers: IncredibleEmployment.be  This test is not yet approved or cleared by the [place] FDA and has been authorized for detection and/or diagnosis of SARS-CoV-2 by FDA under an Emergency Use Authorization (EUA). This EUA will remain in effect (meaning this test can be used) for the duration of the COVID-19 declaration under Section 564(b)(1) of the Act, 21 U.S.C. section 360bbb-3(b)(1), unless the authorization is terminated or revoked.  Performed at [facility], [address] [place], [place] [address]   Blood Culture ID Panel (Reflexed)     Status:  Abnormal   Collection Time: 11/30/20  2:32 PM  Result Value Ref Range Status   Enterococcus faecalis NOT DETECTED NOT DETECTED Final   Enterococcus Faecium NOT DETECTED NOT DETECTED Final   Listeria monocytogenes NOT DETECTED NOT DETECTED Final   Staphylococcus species DETECTED (A) NOT DETECTED Final    Comment: CRITICAL RESULT CALLED TO, READ BACK BY AND VERIFIED WITH: CHARLES SHANLEVER AT 1010 12/01/20.PMF    Staphylococcus aureus (BCID) NOT DETECTED NOT DETECTED Final   Staphylococcus epidermidis DETECTED (A) NOT DETECTED Final    Comment: CRITICAL RESULT CALLED TO, READ BACK BY AND VERIFIED WITH: CHARLES SHANLEVER AT 1010 12/01/20.PMF    Staphylococcus lugdunensis NOT DETECTED NOT DETECTED Final   Streptococcus species NOT DETECTED NOT DETECTED Final   Streptococcus agalactiae NOT DETECTED NOT DETECTED Final   Streptococcus pneumoniae NOT DETECTED NOT DETECTED Final   Streptococcus pyogenes NOT DETECTED NOT DETECTED Final   A.calcoaceticus-baumannii NOT DETECTED NOT DETECTED Final   Bacteroides fragilis NOT DETECTED NOT DETECTED Final   Enterobacterales NOT DETECTED NOT DETECTED Final   Enterobacter cloacae complex NOT DETECTED NOT DETECTED Final   Escherichia coli NOT DETECTED NOT DETECTED Final   Klebsiella aerogenes NOT DETECTED NOT DETECTED Final   Klebsiella oxytoca NOT DETECTED NOT DETECTED Final   Klebsiella pneumoniae NOT DETECTED NOT DETECTED Final   Proteus species NOT DETECTED NOT DETECTED Final   Salmonella species NOT DETECTED NOT DETECTED Final   Serratia marcescens NOT DETECTED NOT DETECTED Final   Haemophilus influenzae NOT DETECTED NOT DETECTED Final   Neisseria meningitidis NOT DETECTED NOT DETECTED Final   Pseudomonas aeruginosa NOT DETECTED NOT DETECTED Final   Stenotrophomonas maltophilia NOT DETECTED NOT DETECTED Final   Candida albicans NOT DETECTED NOT DETECTED Final   Candida auris NOT DETECTED NOT DETECTED Final   Candida glabrata NOT DETECTED NOT  DETECTED Final   Candida krusei NOT DETECTED NOT DETECTED Final   Candida parapsilosis NOT DETECTED NOT DETECTED Final   Candida tropicalis NOT DETECTED NOT DETECTED Final   Cryptococcus neoformans/gattii NOT DETECTED NOT DETECTED Final   Methicillin resistance mecA/C NOT DETECTED NOT DETECTED Final    Comment: Performed at [facility], [address] [place], [place] [address]  Urine Culture     Status: None   Collection Time: 12/01/20  6:00 AM   Specimen: In/Out Cath Urine  Result Value Ref Range Status   Specimen Description   Final    IN/OUT CATH URINE Performed at [facility], [address] [place], [place] [address]    Special Requests   Final    NONE Performed at [facility], [address], [place], [place] [address]    Culture   Final    NO GROWTH Performed at [facility], [address],  [place] [address]    Report Status 12/02/2020 FINAL  Final  CULTURE, BLOOD (ROUTINE X 2) w Reflex to ID Panel     Status: None (Preliminary result)   Collection Time: 12/01/20 11:12 AM   Specimen: BLOOD  Result Value Ref Range Status   Specimen Description BLOOD RIGHT ANTECUBITAL  Final   Special Requests   Final    BOTTLES DRAWN AEROBIC AND ANAEROBIC Blood Culture adequate volume   Culture   Final    NO GROWTH < 24 HOURS Performed at [facility], [address], [place], [place] [address]    Report Status PENDING  Incomplete  CULTURE, BLOOD (ROUTINE X 2) w Reflex to ID Panel     Status: None (Preliminary result)   Collection Time: 12/01/20 11:23 AM   Specimen: BLOOD LEFT HAND  Result Value Ref Range Status   Specimen Description BLOOD LEFT HAND  Final   Special Requests   Final    BOTTLES DRAWN AEROBIC AND ANAEROBIC Blood Culture adequate volume   Culture   Final    NO GROWTH < 24 HOURS Performed at [facility], [address], [place], [place] [address]    Report Status PENDING  Incomplete  MRSA Next  Gen by PCR, Nasal     Status: None   Collection Time: 12/01/20  6:49 PM   Specimen: Nasal Mucosa; Nasal Swab  Result Value Ref Range Status   MRSA by PCR Next Gen NOT DETECTED NOT DETECTED Final    Comment: (NOTE) The GeneXpert MRSA Assay (FDA approved for NASAL specimens only), is one component of a comprehensive MRSA colonization surveillance program. It is not intended to diagnose MRSA infection nor to guide or monitor treatment for MRSA infections. Test performance is not FDA approved in patients less than 59 years old. Performed at [facility], [address], [place], [place] [address]       Studies: No results found.  Scheduled Meds:  azithromycin  500 mg Oral Daily   feeding supplement  1 Container Oral TID BM   feeding supplement (KATE FARMS STANDARD 1.4)  325 mL Oral TID BM   folic acid  1 mg Oral Daily   gabapentin  200 mg Oral BID   hydrocortisone sod succinate (SOLU-CORTEF) inj  50 mg Intravenous Q12H   insulin aspart  0-9 Units Subcutaneous TID WC   multivitamin with minerals  1 tablet Oral Daily   naloxegol oxalate  12.5 mg Oral Daily   nicotine  14 mg Transdermal Daily   pantoprazole  40 mg Oral QHS   Continuous Infusions:  cefTRIAXone (ROCEPHIN)  IV Stopped (12/01/20 1802)   iron sucrose 200 mg (12/02/20 1150)     Code Status:     Code Status Orders  (From admission, onward)           Start     Ordered   11/30/20 1617  Do not attempt resuscitation (DNR)  Continuous       Question Answer Comment  In the event of cardiac or respiratory ARREST Do not call a "code blue"   In the event of cardiac or respiratory ARREST Do not perform Intubation, CPR, defibrillation or ACLS   In the event of cardiac or respiratory ARREST Use medication by any route, position, wound care, and other measures to relive pain and suffering. May use oxygen, suction and manual treatment of airway obstruction as needed for comfort.   Comments CODE STATUS was  discussed with patient and he  is a DNR      11/30/20 1623           Code Status History     Date Active Date Inactive Code Status Order ID Comments User Context   11/21/2020 1517 11/25/2020 1824 DNR [id number]  Collier Bullock, MD ED   12/28/2018 1824 12/30/2018 1909 DNR [id number]  Vaughan Basta, MD ED   08/10/2018 1126 08/10/2018 1625 Full Code [id number]  Algernon Huxley, MD Inpatient   05/19/2017 1435 05/19/2017 1903 Full Code [id number]  Algernon Huxley, MD Inpatient      Family Communication: Caregiver at bedside Disposition Plan: Status is: Inpatient    Dispo: The patient is from: Home              Anticipated d/c is to: Home with home health              patient currently being treated for clinical sepsis with IV antibiotics.   Difficult to place patient.  No.  Consultants: Oncology  Antibiotics: Rocephin and Zithromax  Time spent: 38 minutes  This record has been created using Systems analyst. Errors have been sought and corrected,but may not always be located. Such creation errors do not reflect on the standard of care.   [facility]

## 2020-12-03 DIAGNOSIS — E273 Drug-induced adrenocortical insufficiency: Secondary | ICD-10-CM | POA: Diagnosis not present

## 2020-12-03 DIAGNOSIS — N183 Chronic kidney disease, stage 3 unspecified: Secondary | ICD-10-CM | POA: Diagnosis not present

## 2020-12-03 DIAGNOSIS — A419 Sepsis, unspecified organism: Secondary | ICD-10-CM | POA: Diagnosis not present

## 2020-12-03 DIAGNOSIS — D631 Anemia in chronic kidney disease: Secondary | ICD-10-CM | POA: Diagnosis not present

## 2020-12-03 LAB — GLUCOSE, CAPILLARY
Glucose-Capillary: 131 mg/dL — ABNORMAL HIGH (ref 70–99)
Glucose-Capillary: 133 mg/dL — ABNORMAL HIGH (ref 70–99)
Glucose-Capillary: 163 mg/dL — ABNORMAL HIGH (ref 70–99)
Glucose-Capillary: 182 mg/dL — ABNORMAL HIGH (ref 70–99)

## 2020-12-03 LAB — CBC
HCT: 25.1 % — ABNORMAL LOW (ref 39.0–52.0)
Hemoglobin: 8.4 g/dL — ABNORMAL LOW (ref 13.0–17.0)
MCH: 32.8 pg (ref 26.0–34.0)
MCHC: 33.5 g/dL (ref 30.0–36.0)
MCV: 98 fL (ref 80.0–100.0)
Platelets: 61 10*3/uL — ABNORMAL LOW (ref 150–400)
RBC: 2.56 MIL/uL — ABNORMAL LOW (ref 4.22–5.81)
RDW: 20.7 % — ABNORMAL HIGH (ref 11.5–15.5)
WBC: 11.9 10*3/uL — ABNORMAL HIGH (ref 4.0–10.5)
nRBC: 0 % (ref 0.0–0.2)

## 2020-12-03 LAB — BASIC METABOLIC PANEL
Anion gap: 6 (ref 5–15)
BUN: 25 mg/dL — ABNORMAL HIGH (ref 8–23)
CO2: 28 mmol/L (ref 22–32)
Calcium: 8.4 mg/dL — ABNORMAL LOW (ref 8.9–10.3)
Chloride: 107 mmol/L (ref 98–111)
Creatinine, Ser: 0.83 mg/dL (ref 0.61–1.24)
GFR, Estimated: >60 mL/min (ref >60)
Glucose, Bld: 158 mg/dL — ABNORMAL HIGH (ref 70–99)
Potassium: 3.8 mmol/L (ref 3.5–5.1)
Sodium: 141 mmol/L (ref 135–145)

## 2020-12-03 LAB — CULTURE, BLOOD (ROUTINE X 2): Special Requests: ADEQUATE

## 2020-12-03 MED ORDER — SODIUM CHLORIDE 0.9% FLUSH
10.0000 mL | Freq: Two times a day (BID) | INTRAVENOUS | Status: DC
  Administered 2020-12-03 – 2020-12-05 (×4): 10 mL

## 2020-12-03 MED ORDER — SODIUM CHLORIDE 0.9% FLUSH: 10.0000 mL | INTRAVENOUS | Status: DC | PRN

## 2020-12-03 NOTE — Progress Notes (Signed)
PROGRESS NOTE    James [name]  MEQ:[id number] DOB: [birth year] DOA: 11/30/2020 PCP: [name], MD (Confirm with patient/family/NH records and if not entered, this HAS to be entered at [facility] point of entry. "No PCP" if truly none.)   Chief Complaint  Patient presents with   Urinary Tract Infection   Fever   Fatigue    Brief Narrative:  T70-year-old gentleman with prior history of CLL on chemotherapy, chronic abdominal pain and constipation, anemia of chronic disease, adrenal insufficiency presented with sepsis and left lower lobe pneumonia.  He was started on broad-spectrum IV antibiotics and transition to Rocephin and Zithromax.  Hospital course complicated by staph epidermidis blood cultures, contamination versus actual infection.  Assessment & Plan:   Principal Problem:   Sepsis ([place]) Active Problems:   Hypertension   Anemia due to stage 3 chronic kidney disease (HCC)   CKD stage 3 secondary to diabetes (HCC)   Symptomatic anemia   Current smoker   Chronic pain syndrome   Anemia of chronic disease   Adrenal insufficiency due to cancer therapy ([place])   Depression   HCAP (healthcare-associated pneumonia)   Lobar pneumonia ([place])   Sepsis secondary to left lower lobe pneumonia Started on broad-spectrum IV antibiotics and transition to Rocephin and Zithromax. Pt on RA with good sats.     Staph epidermidis bacteremia Sensitivities are pending.  ID consulted by Dr Reesa Chew yesterday. Repeat blood cultures negative so far. On IV Rocephin,    Chronic abdominal pain on intrathecal pump.   Adrenal insufficiency Currently on hydrocortisone Plan to slowly wean the dose.    CLL on ibrutinib Further recommendations as per hematology and oncology.   Anemia of chronic disease S/p 1 unit of PRBC transfusion Hemoglobin is around 8.4.   Thrombocytopenia Platelets around 61,000 and stable.   Hypertension Blood pressure parameters are optimal at this  time.   DVT prophylaxis: scd's Code Status: DNR Family Communication:NONE AT BEDSIDE.  Disposition:   Status is: Inpatient  Remains inpatient appropriate because:Ongoing diagnostic testing needed not appropriate for outpatient work up and IV treatments appropriate due to intensity of illness or inability to take PO  Dispo: The patient is from: Home              Anticipated d/c is to: Home              Patient currently is not medically stable to d/c.   Difficult to place patient No       Consultants:  Oncology.   Procedures: none.   Antimicrobials:  Antibiotics Given (last 72 hours)     Date/Time Action Medication Dose Rate   11/30/20 1608 New Bag/Given   ceFEPIme (MAXIPIME) 2 g in sodium chloride 0.9 % 100 mL IVPB 2 g 200 mL/hr   11/30/20 1712 New Bag/Given   vancomycin (VANCOCIN) IVPB 1000 mg/200 mL premix 1,000 mg 200 mL/hr   11/30/20 2212 New Bag/Given   meropenem (MERREM) 1 g in sodium chloride 0.9 % 100 mL IVPB 1 g 200 mL/hr   12/01/20 0938 New Bag/Given   meropenem (MERREM) 1 g in sodium chloride 0.9 % 100 mL IVPB 1 g 200 mL/hr   12/01/20 1225 Given   azithromycin (ZITHROMAX) tablet 500 mg 500 mg    12/01/20 1726 New Bag/Given   cefTRIAXone (ROCEPHIN) 2 g in sodium chloride 0.9 % 100 mL IVPB 2 g 200 mL/hr   12/02/20 0903 Given   azithromycin (ZITHROMAX) tablet 500 mg 500 mg  12/02/20 1607 New Bag/Given   cefTRIAXone (ROCEPHIN) 2 g in sodium chloride 0.9 % 100 mL IVPB 2 g 200 mL/hr   12/03/20 9518 Given   azithromycin (ZITHROMAX) tablet 500 mg 500 mg          Subjective: Comfortable, not in distress.   Objective: Vitals:   12/02/20 1530 12/02/20 1948 12/03/20 0434 12/03/20 0806  BP: 132/73 115/63 (!) 141/67 (!) 141/71  Pulse: 73 66 69 64  Resp: 16 16 16 16   Temp: 97.8 F (36.6 C) 98.1 F (36.7 C) 98 F (36.7 C) 98 F (36.7 C)  TempSrc: Oral Oral Oral Oral  SpO2: 99% 100% 100% 100%  Weight:      Height:        Intake/Output Summary  (Last 24 hours) at 12/03/2020 1120 Last data filed at 12/03/2020 8416 Gross per 24 hour  Intake 472.65 ml  Output 0 ml  Net 472.65 ml   Filed Weights   11/30/20 1115  Weight: 59 kg    Examination:  General exam: Appears calm and comfortable  Respiratory system: Clear to auscultation. Respiratory effort normal. Cardiovascular system: S1 & S2 heard, RRR. No JVD, No pedal edema. Gastrointestinal system: Abdomen is nondistended, soft and nontender.  Normal bowel sounds heard. Central nervous system: Alert and oriented. No focal neurological deficits. Extremities: Symmetric 5 x 5 power. Skin: No rashes, lesions or ulcers Psychiatry:  Mood & affect appropriate.     Data Reviewed: I have personally reviewed following labs and imaging studies  CBC: Recent Labs  Lab 11/30/20 1133 12/01/20 0443 12/02/20 0503 12/03/20 0456  WBC 16.0* 10.4 13.1* 11.9*  NEUTROABS  --   --  7.3  --   HGB 9.1* 7.3* 7.1* 8.4*  HCT 27.2* 22.3* 22.1* 25.1*  MCV 102.3* 102.3* 103.3* 98.0  PLT 80* 59* 56* 61*    Basic Metabolic Panel: Recent Labs  Lab 11/30/20 1133 12/01/20 0443 12/02/20 0503 12/03/20 0456  NA 144 139 139 141  K 4.0 3.7 3.9 3.8  CL 108 106 105 107  CO2 26 27 26 28   GLUCOSE 149* 150* 147* 158*  BUN 22 27* 27* 25*  CREATININE 1.23 1.09 1.13 0.83  CALCIUM 8.5* 8.0* 8.2* 8.4*    GFR: Estimated Creatinine Clearance: 68.1 mL/min (by C-G formula based on SCr of 0.83 mg/dL).  Liver Function Tests: Recent Labs  Lab 11/30/20 1432  AST 21  ALT 12  ALKPHOS 71  BILITOT 2.6*  PROT 6.3*  ALBUMIN 3.7    CBG: Recent Labs  Lab 12/02/20 0738 12/02/20 1151 12/02/20 1714 12/02/20 1955 12/03/20 0805  GLUCAP 122* 116* 111* 121* 131*     Recent Results (from the past 240 hour(s))  Blood Culture (routine x 2)     Status: None (Preliminary result)   Collection Time: 11/30/20  2:32 PM   Specimen: BLOOD  Result Value Ref Range Status   Specimen Description   Final    BLOOD  LEFT ANTECUBITAL Performed at [facility], [address], [place], [place] [address]    Special Requests   Final    BOTTLES DRAWN AEROBIC AND ANAEROBIC Blood Culture adequate volume Performed at [facility], [address] [place], [place] [address]    Culture  Setup Time   Final    Organism ID to follow GRAM POSITIVE COCCI IN BOTH AEROBIC AND ANAEROBIC BOTTLES CRITICAL RESULT CALLED TO, READ BACK BY AND VERIFIED WITH: CHARLES SHANLEVER AT 1010 12/01/20.PMF Performed at [facility],  [place], [place] [address]    Culture   Final    CULTURE REINCUBATED FOR BETTER GROWTH STAPHYLOCOCCUS HOMINIS    Report Status PENDING  Incomplete  Resp Panel by RT-PCR (Flu A&B, Covid) Nasopharyngeal Swab     Status: None   Collection Time: 11/30/20  2:32 PM   Specimen: Nasopharyngeal Swab; Nasopharyngeal(NP) swabs in vial transport medium  Result Value Ref Range Status   SARS Coronavirus 2 by RT PCR NEGATIVE NEGATIVE Final    Comment: (NOTE) SARS-CoV-2 target nucleic acids are NOT DETECTED.  The SARS-CoV-2 RNA is generally detectable in upper respiratory specimens during the acute phase of infection. The lowest concentration of SARS-CoV-2 viral copies this assay can detect is 138 copies/mL. A negative result does not preclude SARS-Cov-2 infection and should not be used as the sole basis for treatment or other patient management decisions. A negative result may occur with  improper specimen collection/handling, submission of specimen other than nasopharyngeal swab, presence of viral mutation(s) within the areas targeted by this assay, and inadequate number of viral copies(<138 copies/mL). A negative result must be combined with clinical observations, patient history, and epidemiological information. The expected result is Negative.  Fact Sheet for Patients:  EntrepreneurPulse.com.au  Fact Sheet for Healthcare Providers:   IncredibleEmployment.be  This test is no t yet approved or cleared by the [place] FDA and  has been authorized for detection and/or diagnosis of SARS-CoV-2 by FDA under an Emergency Use Authorization (EUA). This EUA will remain  in effect (meaning this test can be used) for the duration of the COVID-19 declaration under Section 564(b)(1) of the Act, 21 U.S.C.section 360bbb-3(b)(1), unless the authorization is terminated  or revoked sooner.       Influenza A by PCR NEGATIVE NEGATIVE Final   Influenza B by PCR NEGATIVE NEGATIVE Final    Comment: (NOTE) The Xpert Xpress SARS-CoV-2/FLU/RSV plus assay is intended as an aid in the diagnosis of influenza from Nasopharyngeal swab specimens and should not be used as a sole basis for treatment. Nasal washings and aspirates are unacceptable for Xpert Xpress SARS-CoV-2/FLU/RSV testing.  Fact Sheet for Patients: EntrepreneurPulse.com.au  Fact Sheet for Healthcare Providers: IncredibleEmployment.be  This test is not yet approved or cleared by the [place] FDA and has been authorized for detection and/or diagnosis of SARS-CoV-2 by FDA under an Emergency Use Authorization (EUA). This EUA will remain in effect (meaning this test can be used) for the duration of the COVID-19 declaration under Section 564(b)(1) of the Act, 21 U.S.C. section 360bbb-3(b)(1), unless the authorization is terminated or revoked.  Performed at [facility], [address] [place], [place] [address]   Blood Culture ID Panel (Reflexed)     Status: Abnormal   Collection Time: 11/30/20  2:32 PM  Result Value Ref Range Status   Enterococcus faecalis NOT DETECTED NOT DETECTED Final   Enterococcus Faecium NOT DETECTED NOT DETECTED Final   Listeria monocytogenes NOT DETECTED NOT DETECTED Final   Staphylococcus species DETECTED (A) NOT DETECTED Final    Comment: CRITICAL RESULT CALLED TO, READ  BACK BY AND VERIFIED WITH: CHARLES SHANLEVER AT 1010 12/01/20.PMF    Staphylococcus aureus (BCID) NOT DETECTED NOT DETECTED Final   Staphylococcus epidermidis DETECTED (A) NOT DETECTED Final    Comment: CRITICAL RESULT CALLED TO, READ BACK BY AND VERIFIED WITH: CHARLES SHANLEVER AT 1010 12/01/20.PMF    Staphylococcus lugdunensis NOT DETECTED NOT DETECTED Final   Streptococcus species NOT DETECTED NOT DETECTED Final   Streptococcus  agalactiae NOT DETECTED NOT DETECTED Final   Streptococcus pneumoniae NOT DETECTED NOT DETECTED Final   Streptococcus pyogenes NOT DETECTED NOT DETECTED Final   A.calcoaceticus-baumannii NOT DETECTED NOT DETECTED Final   Bacteroides fragilis NOT DETECTED NOT DETECTED Final   Enterobacterales NOT DETECTED NOT DETECTED Final   Enterobacter cloacae complex NOT DETECTED NOT DETECTED Final   Escherichia coli NOT DETECTED NOT DETECTED Final   Klebsiella aerogenes NOT DETECTED NOT DETECTED Final   Klebsiella oxytoca NOT DETECTED NOT DETECTED Final   Klebsiella pneumoniae NOT DETECTED NOT DETECTED Final   Proteus species NOT DETECTED NOT DETECTED Final   Salmonella species NOT DETECTED NOT DETECTED Final   Serratia marcescens NOT DETECTED NOT DETECTED Final   Haemophilus influenzae NOT DETECTED NOT DETECTED Final   Neisseria meningitidis NOT DETECTED NOT DETECTED Final   Pseudomonas aeruginosa NOT DETECTED NOT DETECTED Final   Stenotrophomonas maltophilia NOT DETECTED NOT DETECTED Final   Candida albicans NOT DETECTED NOT DETECTED Final   Candida auris NOT DETECTED NOT DETECTED Final   Candida glabrata NOT DETECTED NOT DETECTED Final   Candida krusei NOT DETECTED NOT DETECTED Final   Candida parapsilosis NOT DETECTED NOT DETECTED Final   Candida tropicalis NOT DETECTED NOT DETECTED Final   Cryptococcus neoformans/gattii NOT DETECTED NOT DETECTED Final   Methicillin resistance mecA/C NOT DETECTED NOT DETECTED Final    Comment: Performed at [facility], [address], [place], [place] [address]  Urine Culture     Status: None   Collection Time: 12/01/20  6:00 AM   Specimen: In/Out Cath Urine  Result Value Ref Range Status   Specimen Description   Final    IN/OUT CATH URINE Performed at [facility] [address] [place], [place] [address]    Special Requests   Final    NONE Performed at [facility], [address], [place], [place] [address]    Culture   Final    NO GROWTH Performed at [facility], [address] [place], [place] [address]    Report Status 12/02/2020 FINAL  Final  CULTURE, BLOOD (ROUTINE X 2) w Reflex to ID Panel     Status: None (Preliminary result)   Collection Time: 12/01/20 11:12 AM   Specimen: BLOOD  Result Value Ref Range Status   Specimen Description BLOOD RIGHT ANTECUBITAL  Final   Special Requests   Final    BOTTLES DRAWN AEROBIC AND ANAEROBIC Blood Culture adequate volume   Culture   Final    NO GROWTH 2 DAYS Performed at [facility], [address], [place], [place] [address]    Report Status PENDING  Incomplete  CULTURE, BLOOD (ROUTINE X 2) w Reflex to ID Panel     Status: None (Preliminary result)   Collection Time: 12/01/20 11:23 AM   Specimen: BLOOD LEFT HAND  Result Value Ref Range Status   Specimen Description BLOOD LEFT HAND  Final   Special Requests   Final    BOTTLES DRAWN AEROBIC AND ANAEROBIC Blood Culture adequate volume   Culture   Final    NO GROWTH 2 DAYS Performed at [facility], [address], [place], [place] [address]    Report Status PENDING  Incomplete  MRSA Next Gen by PCR, Nasal     Status: None   Collection Time: 12/01/20  6:49 PM   Specimen: Nasal Mucosa; Nasal Swab  Result Value Ref Range Status   MRSA by PCR Next Gen NOT DETECTED NOT  DETECTED Final    Comment: (NOTE) The GeneXpert MRSA Assay (FDA approved for NASAL specimens only), is one component of a comprehensive MRSA colonization surveillance program. It  is not intended to diagnose MRSA infection nor to guide or monitor treatment for MRSA infections. Test performance is not FDA approved in patients less than 70 years old. Performed at [facility], [address], [place], [place] [address]          Radiology Studies: No results found.      Scheduled Meds:  azithromycin  500 mg Oral Daily   feeding supplement  1 Container Oral TID BM   feeding supplement (KATE FARMS STANDARD 1.4)  325 mL Oral TID BM   folic acid  1 mg Oral Daily   gabapentin  200 mg Oral BID   hydrocortisone sod succinate (SOLU-CORTEF) inj  50 mg Intravenous Q12H   insulin aspart  0-9 Units Subcutaneous TID WC   multivitamin with minerals  1 tablet Oral Daily   naloxegol oxalate  12.5 mg Oral Daily   nicotine  14 mg Transdermal Daily   pantoprazole  40 mg Oral QHS   Continuous Infusions:  cefTRIAXone (ROCEPHIN)  IV 2 g (12/02/20 1607)   iron sucrose 200 mg (12/03/20 0831)     LOS: 3 days       Hosie Poisson, MD [facility]   To contact the attending provider between 7A-7P or the covering provider during after hours 7P-7A, please log into the web site [email or web address] and access using universal [place] password for that web site. If you do not have the password, please call the hospital operator.  12/03/2020, 11:20 AM

## 2020-12-04 ENCOUNTER — Other Ambulatory Visit (HOSPITAL_COMMUNITY): Payer: Self-pay

## 2020-12-04 ENCOUNTER — Telehealth: Payer: Self-pay | Admitting: Pharmacy Technician

## 2020-12-04 ENCOUNTER — Encounter: Payer: Self-pay | Admitting: Internal Medicine

## 2020-12-04 DIAGNOSIS — B958 Unspecified staphylococcus as the cause of diseases classified elsewhere: Secondary | ICD-10-CM

## 2020-12-04 DIAGNOSIS — J181 Lobar pneumonia, unspecified organism: Secondary | ICD-10-CM | POA: Diagnosis not present

## 2020-12-04 DIAGNOSIS — R7881 Bacteremia: Secondary | ICD-10-CM | POA: Diagnosis not present

## 2020-12-04 DIAGNOSIS — J189 Pneumonia, unspecified organism: Secondary | ICD-10-CM | POA: Diagnosis not present

## 2020-12-04 DIAGNOSIS — A419 Sepsis, unspecified organism: Secondary | ICD-10-CM | POA: Diagnosis not present

## 2020-12-04 DIAGNOSIS — D638 Anemia in other chronic diseases classified elsewhere: Secondary | ICD-10-CM | POA: Diagnosis not present

## 2020-12-04 DIAGNOSIS — D649 Anemia, unspecified: Secondary | ICD-10-CM | POA: Diagnosis not present

## 2020-12-04 DIAGNOSIS — E273 Drug-induced adrenocortical insufficiency: Secondary | ICD-10-CM | POA: Diagnosis not present

## 2020-12-04 LAB — TYPE AND SCREEN
ABO/RH(D): A POS
Antibody Screen: NEGATIVE
Unit division: 0

## 2020-12-04 LAB — BPAM RBC
Blood Product Expiration Date: 202208202359
ISSUE DATE / TIME: 202208061111
Unit Type and Rh: 6200

## 2020-12-04 LAB — GLUCOSE, CAPILLARY
Glucose-Capillary: 129 mg/dL — ABNORMAL HIGH (ref 70–99)
Glucose-Capillary: 193 mg/dL — ABNORMAL HIGH (ref 70–99)
Glucose-Capillary: 169 mg/dL — ABNORMAL HIGH (ref 70–99)
Glucose-Capillary: 159 mg/dL — ABNORMAL HIGH (ref 70–99)

## 2020-12-04 MED ORDER — HYDROCORTISONE 10 MG PO TABS
20.0000 mg | ORAL_TABLET | Freq: Every evening | ORAL | Status: DC
  Administered 2020-12-04: 20 mg via ORAL
  Filled 2020-12-04 (×2): qty 2

## 2020-12-04 MED ORDER — CEFUROXIME AXETIL 500 MG PO TABS
500.0000 mg | ORAL_TABLET | Freq: Two times a day (BID) | ORAL | Status: DC
  Administered 2020-12-04 – 2020-12-05 (×2): 500 mg via ORAL
  Filled 2020-12-04 (×3): qty 1

## 2020-12-04 MED ORDER — HYDROCORTISONE 10 MG PO TABS
30.0000 mg | ORAL_TABLET | Freq: Every day | ORAL | Status: DC
  Administered 2020-12-05: 30 mg via ORAL
  Filled 2020-12-04 (×2): qty 3

## 2020-12-04 NOTE — Progress Notes (Signed)
Physical Therapy Treatment Patient Details Name: James [name] MRN: [id number] DOB: January 01, 1951 Today's Date: 12/04/2020    History of Present Illness Pt is a 70 y/o M admitted on 11/30/20 with c/c of weakness & fever. Pt was recently d/c from hospital 4 days prior after tx for symptomatic anemia. Pt found to have L lower lobe PNA. Pt is being treated for sepsis from HCAP. PMH: CLL on chemotherapy, opioid induced constipation, DM, depression, GERD    PT Comments    Patient alert, agreeable to PT with encouragement. Pt demonstrated good progression towards goals, a bit impulsive with ambulation; tended to lift RW from floor, and ambulated ~48ft without it. No LOB, but RW encouraged to maximize safety at this time. Bed mobility performed modI, and pt requested to return to bed after ambulation, did not want to sit up in recliner. ~67ft with CGA and RW, pt declined ambulation out in the hallway.The patient would benefit from further skilled PT intervention to continue to progress towards goals. Recommendation remains appropriate.     Follow Up Recommendations  Home health PT;Supervision/Assistance - 24 hour     Equipment Recommendations  Rolling walker with 5" wheels    Recommendations for Other Services       Precautions / Restrictions Precautions Precautions: Fall Restrictions Weight Bearing Restrictions: No    Mobility  Bed Mobility Overal bed mobility: Needs Assistance Bed Mobility: Sit to Supine;Supine to Sit     Supine to sit: Modified independent (Device/Increase time) Sit to supine: Modified independent (Device/Increase time)        Transfers Overall transfer level: Needs assistance Equipment used: Rolling walker (2 wheeled) Transfers: Sit to/from Stand Sit to Stand: Supervision         General transfer comment: cued for hand placement  Ambulation/Gait Ambulation/Gait assistance: Supervision;Min guard Gait Distance (Feet): 50 Feet Assistive device: Rolling  walker (2 wheeled);None       General Gait Details: pt did not want to leave room, but able to ambulate three laps, 1 lap without RW impulsively. no LOB, pt often lifts RW   Stairs             Wheelchair Mobility    Modified Rankin (Stroke Patients Only)       Balance Overall balance assessment: Needs assistance Sitting-balance support: Feet supported;Bilateral upper extremity supported Sitting balance-Leahy Scale: Good     Standing balance support: No upper extremity supported Standing balance-Leahy Scale: Good                              Cognition Arousal/Alertness: Awake/alert Behavior During Therapy: WFL for tasks assessed/performed Overall Cognitive Status: Within Functional Limits for tasks assessed                                 General Comments: able to perform tasks/hold conversation with no immediate deficits in cognition noted. some difficulty with word finding      Exercises      General Comments General comments (skin integrity, edema, etc.): initallly reported light headedness upon sitting, decreased with time and mobility      Pertinent Vitals/Pain Pain Assessment: Faces Faces Pain Scale: Hurts a little bit Pain Location: abdominal pain, pt reports chronic, no change wiht positioning Pain Intervention(s): Limited activity within patient's tolerance;Monitored during session    Home Living  Prior Function            PT Goals (current goals can now be found in the care plan section) Progress towards PT goals: Progressing toward goals    Frequency    Min 2X/week      PT Plan Current plan remains appropriate    Co-evaluation              AM-PAC PT "6 Clicks" Mobility   Outcome Measure  Help needed turning from your back to your side while in a flat bed without using bedrails?: None Help needed moving from lying on your back to sitting on the side of a flat bed  without using bedrails?: None Help needed moving to and from a bed to a chair (including a wheelchair)?: None Help needed standing up from a chair using your arms (e.g., wheelchair or bedside chair)?: None Help needed to walk in hospital room?: None Help needed climbing 3-5 steps with a railing? : A Little 6 Click Score: 23    End of Session Equipment Utilized During Treatment: Gait belt Activity Tolerance: Patient tolerated treatment well Patient left: in bed;with call bell/phone within reach;with bed alarm set   PT Visit Diagnosis: Unsteadiness on feet (R26.81);History of falling (Z91.81)     Time: 7867-5449 PT Time Calculation (min) (ACUTE ONLY): 12 min  Charges:  $Therapeutic Exercise: 8-22 mins                    Lieutenant Diego PT, DPT 2:14 PM,12/04/20

## 2020-12-04 NOTE — Progress Notes (Signed)
ID Pt is feeling except for chronic abdominal pain Patient Vitals for the past 24 hrs:  BP Temp Temp src Pulse Resp SpO2  12/04/20 1946 (!) 156/78 98.6 F (37 C) Oral 65 10 100 %  12/04/20 1659 -- 97.6 F (36.4 C) Oral 69 20 100 %  12/04/20 1500 (!) 169/72 97.8 F (36.6 C) Oral 61 18 --  12/04/20 0758 (!) 154/73 97.9 F (36.6 C) Oral (!) 53 19 100 %  12/04/20 0413 (!) 144/81 97.7 F (36.5 C) -- (!) 48 16 99 %    Awake and alert Chronically ill Chest b/l air entry Hs1s2 Abd soft Pain pump in the left lower quadrant.  Site clean with no erythema. Cns non focal  Current medicines Azithromycin Cefuroxime Hydrocortisone 20 mgIn the evening and 30 mg in the morning  insulin sliding scale Movantik Protonix Folic acid Gabapentin   Labs CBC Latest Ref Rng & Units 12/03/2020 12/02/2020 12/01/2020  WBC 4.0 - 10.5 K/uL 11.9(H) 13.1(H) 10.4  Hemoglobin 13.0 - 17.0 g/dL 8.4(L) 7.1(L) 7.3(L)  Hematocrit 39.0 - 52.0 % 25.1(L) 22.1(L) 22.3(L)  Platelets 150 - 400 K/uL 61(L) 56(L) 59(L)     CMP Latest Ref Rng & Units 12/03/2020 12/02/2020 12/01/2020  Glucose 70 - 99 mg/dL 158(H) 147(H) 150(H)  BUN 8 - 23 mg/dL 25(H) 27(H) 27(H)  Creatinine 0.61 - 1.24 mg/dL 0.83 1.13 1.09  Sodium 135 - 145 mmol/L 141 139 139  Potassium 3.5 - 5.1 mmol/L 3.8 3.9 3.7  Chloride 98 - 111 mmol/L 107 105 106  CO2 22 - 32 mmol/L 28 26 27   Calcium 8.9 - 10.3 mg/dL 8.4(L) 8.2(L) 8.0(L)  Total Protein 6.5 - 8.1 g/dL - - -  Total Bilirubin 0.3 - 1.2 mg/dL - - -  Alkaline Phos 38 - 126 U/L - - -  AST 15 - 41 U/L - - -  ALT 0 - 44 U/L - - -   Micro 11/30/2020 Blood culture Staph epidermidis and Staph hominis  8 /5/22 repeat blood cultures no growth  Impression/recommendation 70 year old male with history of CLL on ibrutinib, anemia, chronic epigastric pain presented with weakness and fever.  Staph epidermidis and Staph hominis in the blood cultures are contaminants.  Hence need not be treated.  Left lower lobe  pneumonia has been on IV ceftriaxone and azithromycin.  We will just need another day of antibiotic to complete 5 days.  CLL on ibrutinib  Thrombocytopenia and anemia  AKA is improved  Discussed the management with the patient and the hospitalist.  ID will sign off.  Call if needed.

## 2020-12-04 NOTE — Progress Notes (Signed)
James [name] at [place] NAME: James [name]    MR#:  [id number]  DATE OF BIRTH:  November 20, 1950  SUBJECTIVE:  laying in bed comfortably. Complains of chronic abdominal pain. Able to tolerate PO diet. Had good bowel movement. Nonbloody discharge.  REVIEW OF SYSTEMS:   Review of Systems  Constitutional:  Negative for chills, fever and weight loss.  HENT:  Negative for ear discharge, ear pain and nosebleeds.   Eyes:  Negative for blurred vision, pain and discharge.  Respiratory:  Negative for sputum production, shortness of breath, wheezing and stridor.   Cardiovascular:  Negative for chest pain, palpitations, orthopnea and PND.  Gastrointestinal:  Negative for abdominal pain, diarrhea, nausea and vomiting.  Genitourinary:  Negative for frequency and urgency.  Musculoskeletal:  Negative for back pain and joint pain.  Neurological:  Positive for weakness. Negative for sensory change, speech change and focal weakness.  Psychiatric/Behavioral:  Negative for depression and hallucinations. The patient is not nervous/anxious.   Tolerating Diet:yes Tolerating PT: HHPT  DRUG ALLERGIES:  No Known Allergies  VITALS:  Blood pressure (!) 154/73, pulse (!) 53, temperature 97.9 F (36.6 C), temperature source Oral, resp. rate 19, height 5\' 8"  (1.727 m), weight 59 kg, SpO2 100 %.  PHYSICAL EXAMINATION:   Physical Exam  GENERAL:  70 y.o.-year-old patient lying in the bed with no acute distress.  Pallor+ LUNGS: Normal breath sounds bilaterally, no wheezing, rales, rhonchi. No use of accessory muscles of respiration.  CARDIOVASCULAR: S1, S2 normal. No murmurs, rubs, or gallops.  ABDOMEN: Soft, nontender, nondistended. Bowel sounds present. No organomegaly or mass.  EXTREMITIES: No cyanosis, clubbing or edema b/l.    NEUROLOGIC: nonfocal PSYCHIATRIC:  patient is alert and oriented x 3.  SKIN: No obvious rash, lesion, or ulcer.   LABORATORY PANEL:   CBC Recent Labs  Lab 12/03/20 0456  WBC 11.9*  HGB 8.4*  HCT 25.1*  PLT 61*    Chemistries  Recent Labs  Lab 11/30/20 1432 12/01/20 0443 12/03/20 0456  NA  --    < > 141  K  --    < > 3.8  CL  --    < > 107  CO2  --    < > 28  GLUCOSE  --    < > 158*  BUN  --    < > 25*  CREATININE  --    < > 0.83  CALCIUM  --    < > 8.4*  AST 21  --   --   ALT 12  --   --   ALKPHOS 71  --   --   BILITOT 2.6*  --   --    < > = values in this interval not displayed.   Cardiac Enzymes No results for input(s): TROPONINI in the last 168 hours. RADIOLOGY:  No results found. ASSESSMENT AND PLAN:  70 year old gentleman with prior history of CLL on chemotherapy, chronic abdominal pain and constipation, anemia of chronic disease, adrenal insufficiency presented with sepsis and left lower lobe pneumonia.  Sepsis secondary left lower lobe pneumonia -- symptoms improved. -- white count normal. Afebrile. Patient on room air with good sats -- change to oral antibiotic for total five days.  Staph epidermis bacteremia -- appears contamination. -- discussed with infectious disease-- treat pneumonia only -- repeat blood cultures negative  chronic abdominal pain unclear etiology -- patient is on intrathecal pain pump follows with Dr. Dossie Arbour at the pain clinic  history of adrenal insufficiency -- continue hydrocortisone  CLL -- on Ibrutinib-- managed by Dr. Rogue Bussing  anemia of chronic disease -- status post one unit blood transfusion -- hemoglobin 8.4  generalized weakness. Physical therapy recommends home health PT  discussed at length with patient's son James [name] on the phone. Patient does not meet rehab criteria. Options about private caregivers/sitter and/or assisted living discussed with patient's son. Social worker to do patient's son and discuss if there is any other options for discharge planning.  Procedures: Family communication : James [name] Consults : ID, oncology CODE  STATUS: DNR DVT Prophylaxis : SCD Level of care: Med-Surg Status is: Inpatient  Remains inpatient appropriate because:Unsafe d/c plan  Dispo: The patient is from: Home              Anticipated d/c is to: Home              Patient currently is medically stable to d/c.   Difficult to place patient No  continue to monitor one more day. If remains stable discharged home with home health PT tomorrow. Patient son James [name] is aware.      TOTAL TIME TAKING CARE OF THIS PATIENT: 25 minutes.  >50% time spent on counselling and coordination of care  Note: This dictation was prepared with Dragon dictation along with smaller phrase technology. Any transcriptional errors that result from this process are unintentional.  [name] M.D    [facility]   CC: Primary care physician; [name], MD [name], male   DOB: [birth year], 70 y.o.   MRN: [id number]

## 2020-12-04 NOTE — Progress Notes (Signed)
James [name]   DOB:11-16-1950   MW#:[id number]    Subjective: Patient denies any nausea vomiting.  Denies any blood in the stools or black or stools.  Continues to complain of generalized weakness.  No fevers or chills.  Objective:  Vitals:   12/04/20 1659 12/04/20 1946  BP:  (!) 156/78  Pulse: 69 65  Resp: 20 10  Temp: 97.6 F (36.4 C) 98.6 F (37 C)  SpO2: 100% 100%     Intake/Output Summary (Last 24 hours) at 12/04/2020 2325 Last data filed at 12/04/2020 2129 Gross per 24 hour  Intake 200 ml  Output 1700 ml  Net -1500 ml    Physical Exam Vitals and nursing note reviewed.  Constitutional:      Comments: Patient resting in the bed/.   Pale appearing.    HENT:     Head: Normocephalic and atraumatic.     Mouth/Throat:     Mouth: Mucous membranes are moist.     Pharynx: No oropharyngeal exudate.  Eyes:     Pupils: Pupils are equal, round, and reactive to light.  Cardiovascular:     Rate and Rhythm: Normal rate and regular rhythm.  Pulmonary:     Effort: No respiratory distress.     Breath sounds: No wheezing.     Comments: Decreased breath sounds bilaterally at bases.  No wheeze or crackles Abdominal:     General: Bowel sounds are normal. There is no distension.     Palpations: Abdomen is soft. There is no mass.     Tenderness: There is no abdominal tenderness. There is no guarding or rebound.  Musculoskeletal:        General: No tenderness. Normal range of motion.  Skin:    General: Skin is warm.  Neurological:     Mental Status: He is alert and oriented to person, place, and time.  Psychiatric:        Mood and Affect: Affect normal.        Judgment: Judgment normal.     Labs:  Lab Results  Component Value Date   WBC 11.9 (H) 12/03/2020   HGB 8.4 (L) 12/03/2020   HCT 25.1 (L) 12/03/2020   MCV 98.0 12/03/2020   PLT 61 (L) 12/03/2020   NEUTROABS 7.3 12/02/2020    Lab Results  Component Value Date   NA 141 12/03/2020   K 3.8 12/03/2020   CL 107  12/03/2020   CO2 28 12/03/2020    Studies:  No results found.  CLL (chronic lymphocytic leukemia) ([place]) #70 year old male patient with a history of CLL/relapsed bone marrow/11 P deletion 7 q. Deletion/ chronic abdominal pain of unclear etiology-s/p pain pump; question adrenal insufficiency is currently admitted to hospital for worsening generalized weakness-CT scan abdomen pelvis shows left lower lobe pneumonia  #CLL-most recently on ibrutinib.  Recommend discontinuation of ibrutinib given acute infection [see below].  Will consider alternate tyrosine kinase inhibitor-given patient's   #Infectious disease: Left lower lobe pneumonia-on broad-spectrum antibiotics; staph epidermis bacteremia-[recent pain pump access]-thought to be contaminant.  Defer to ID for antibiotic recommendations.  #Symptomatic severe anemia-multifactorial; suspect predominant CLL/SLL involving the bone marrow BX April 2022 bone marrow biopsy. July 2022- EGD shows multiple AV malformations stomach status post argon laser.  Agree with holding aspirin Plavix [see below].  S/p iron infusions x3.  #Abdominal pain chronic-s/p evaluation with pain clinic s/p pain pump; question adrenal insufficiency currently on hydrocortisone-recommend home dose of hydrocortisone at discharge.  # DNR/DNI  Disposition: Patient is clinically  stable from medical oncology standpoint.  We will make follow-up appointments in the next couple of weeks. Discussed with Dr.Patel.    Cammie Sickle, MD 12/04/2020  11:25 PM

## 2020-12-04 NOTE — Telephone Encounter (Signed)
Oral Oncology Patient Advocate Encounter  Prior Authorization for Calqu[employer] has been approved.    PA# [id number] Effective dates: 12/04/20 through 12/04/21  Patients co-pay is $730.11.  Patient has a grant through [employer] to cover copay.  Oral Oncology Clinic will continue to follow.   [place] Patient [place] Phone [phone] Fax [phone] 12/04/2020 9:52 AM

## 2020-12-04 NOTE — TOC Progression Note (Signed)
Transition of Care [facility] - Progression Note    Patient Details  Name: James [name] MRN: [id number] Date of Birth: Jan 09, 1951  Transition of Care [facility]) CM/SW Contact  Beverly Sessions, RN Phone Number: 12/04/2020, 4:38 PM  Clinical Narrative:    Spoke with son [name].  He is aware that plan is for patient to discharge tomorrow with resumption of home health services.   Emailed ALF list and PCS list to some for additional resources   Family friend Gerald Stabs to transport tomorrow    Expected Discharge Plan: [place] Barriers to Discharge: Continued Medical Work up  Expected Discharge Plan and Services Expected Discharge Plan: [place] Choice: Resumption of Svcs/PTA Provider Living arrangements for the past 2 months: Single Family Home                           HH Arranged: PT, OT [place] Agency: [facility] ([place]) Date [place]: 12/01/20   Representative spoke with at [place]: Sent secure email to [place] (SDOH) Interventions    Readmission Risk Interventions Readmission Risk Prevention Plan 12/01/2020  Transportation Screening Complete  Medication Review Press photographer) Complete  PCP or Specialist appointment within 3-5 days of discharge Complete  HRI or [place] Complete  SW Recovery Care/Counseling Consult Complete  [place] Not Applicable  Some recent data might be hidden

## 2020-12-04 NOTE — Telephone Encounter (Signed)
Oral Oncology Patient Advocate Encounter   Received notification from [facility] that prior authorization for [employer] is required.   PA submitted on CoverMyMeds Key [id number]  Status is pending   Oral Oncology Clinic will continue to follow.  [place] Patient [place] Phone [phone] Fax [phone] 12/04/2020 9:23 AM

## 2020-12-04 NOTE — Progress Notes (Signed)
Occupational Therapy Treatment Patient Details Name: James [name] MRN: [id number] DOB: 06/05/1950 Today's Date: 12/04/2020    History of present illness Pt is a 70 y/o M admitted on 11/30/20 with c/c of weakness & fever. Pt was recently d/c from hospital 4 days prior after tx for symptomatic anemia. Pt found to have L lower lobe PNA. Pt is being treated for sepsis from HCAP. PMH: CLL on chemotherapy, opioid induced constipation, DM, depression, GERD   OT comments  Pt seen for OT tx this date. Pt performed bed mobility with modified independence. Tolerated sitting EOB >68min for seated UB and LB bathing with set up and supervision. Good dynamic sitting balance. Denied significant fatigue and no increase in chronic abdominal pain with task. Pt educated in positioning for ADL and energy conservation strategies to support safety/falls prevention at home. Pt verbalized understanding. Continues to benefit from skilled OT services, recommend HHOT at discharge.   Follow Up Recommendations  Home health OT;Supervision/Assistance - 24 hour    Equipment Recommendations  Tub/shower seat    Recommendations for Other Services      Precautions / Restrictions Precautions Precautions: Fall Restrictions Weight Bearing Restrictions: No       Mobility Bed Mobility Overal bed mobility: Needs Assistance Bed Mobility: Sit to Supine;Supine to Sit     Supine to sit: Modified independent (Device/Increase time) Sit to supine: Modified independent (Device/Increase time)             Balance Overall balance assessment: Needs assistance Sitting-balance support: Feet supported;Feet unsupported;No upper extremity supported Sitting balance-Leahy Scale: Good                                  ADL either performed or assessed with clinical judgement   ADL Overall ADL's : Needs assistance/impaired     Grooming: Sitting;Set up   Upper Body Bathing: Sitting;Supervision/ safety;Set up    Lower Body Bathing: Sit to/from stand;Sitting/lateral leans;Set up;Supervison/ safety                               Vision       Perception     Praxis      Cognition Arousal/Alertness: Awake/alert Behavior During Therapy: WFL for tasks assessed/performed Overall Cognitive Status: Within Functional Limits for tasks assessed                                         Exercises     Shoulder Instructions       General Comments     Pertinent Vitals/ Pain       Pain Assessment: Faces Faces Pain Scale: Hurts a little bit Pain Location: abdominal pain, pt reports chronic, no change with positioning Pain Descriptors / Indicators: Aching Pain Intervention(s): Limited activity within patient's tolerance;Monitored during session;Repositioned  Home Living                                          Prior Functioning/Environment              Frequency  Min 2X/week        Progress Toward Goals  OT Goals(current goals can now be found in the care  plan section)  Progress towards OT goals: Progressing toward goals  Acute Rehab OT Goals Patient Stated Goal: go home soon OT Goal Formulation: With patient Time For Goal Achievement: 12/15/20 Potential to Achieve Goals: Good  Plan Discharge plan remains appropriate;Frequency remains appropriate    Co-evaluation                 AM-PAC OT "6 Clicks" Daily Activity     Outcome Measure   Help from another person eating meals?: None Help from another person taking care of personal grooming?: A Little Help from another person toileting, which includes using toliet, bedpan, or urinal?: A Little Help from another person bathing (including washing, rinsing, drying)?: A Little Help from another person to put on and taking off regular upper body clothing?: None Help from another person to put on and taking off regular lower body clothing?: A Little 6 Click Score: 20    End  of Session    OT Visit Diagnosis: Other abnormalities of gait and mobility (R26.89);Repeated falls (R29.6);Muscle weakness (generalized) (M62.81)   Activity Tolerance Patient tolerated treatment well   Patient Left in bed;with call bell/phone within reach;with bed alarm set   Nurse Communication          Time: 1446-1510 OT Time Calculation (min): 24 min  Charges: OT General Charges $OT Visit: 1 Visit OT Treatments $Self Care/Home Management : 23-37 mins  Hanley Hays, MPH, MS, OTR/L ascom [phone] 12/04/20, 3:14 PM

## 2020-12-04 NOTE — Telephone Encounter (Signed)
Oral Oncology Patient Advocate Encounter   Was successful in securing patient an $9,300 grant from [facility]) to provide copayment coverage for [employer].  This will keep the out of pocket expense at $0.     The billing information is as follows and has been shared with [place].   Member ID: [id number] Group ID: [id number] RxBin: [id number] Dates of Eligibility: 12/04/20 through 12/03/21  Fund:  [place] Patient [place] Phone [phone] Fax [phone] 12/04/2020 10:27 AM

## 2020-12-05 ENCOUNTER — Telehealth: Payer: Self-pay | Admitting: Pharmacist

## 2020-12-05 DIAGNOSIS — G894 Chronic pain syndrome: Secondary | ICD-10-CM | POA: Diagnosis not present

## 2020-12-05 DIAGNOSIS — A419 Sepsis, unspecified organism: Secondary | ICD-10-CM | POA: Diagnosis not present

## 2020-12-05 DIAGNOSIS — D638 Anemia in other chronic diseases classified elsewhere: Secondary | ICD-10-CM | POA: Diagnosis not present

## 2020-12-05 DIAGNOSIS — E1122 Type 2 diabetes mellitus with diabetic chronic kidney disease: Secondary | ICD-10-CM | POA: Diagnosis not present

## 2020-12-05 LAB — GLUCOSE, CAPILLARY
Glucose-Capillary: 158 mg/dL — ABNORMAL HIGH (ref 70–99)
Glucose-Capillary: 111 mg/dL — ABNORMAL HIGH (ref 70–99)

## 2020-12-05 NOTE — Plan of Care (Signed)
Pt adequate for discharge

## 2020-12-05 NOTE — Care Management Important Message (Signed)
Important Message  Patient Details  Name: James [name] MRN: [id number] Date of Birth: [birth year]   Medicare Important Message Given:  Yes     Dannette Barbara 12/05/2020, 10:46 AM

## 2020-12-05 NOTE — TOC Transition Note (Signed)
Transition of Care [facility]) - CM/SW Discharge Note   Patient Details  Name: James [name] MRN: [id number] Date of Birth: [birth year]  [facility] of [facility]) CM/SW Contact:  Beverly Sessions, RN Phone Number: 12/05/2020, 10:33 AM   Clinical Narrative:     Patient to discharge home today James [name] with [place] notified of discharge  Confirmed with patient that he wishes to return home at discharge Patient was receptive to resources to potentially use for the future.  Provided ALF and PCS list.  He is aware that I also emailed his son resources as well  Patient confirms that his friend James [name] will be picking up at discharge     Barriers to Discharge: Continued Medical Work up   Patient Goals and CMS Choice     Choice offered to / list presented to : NA  Discharge Placement                       Discharge Plan and Services     Post Acute Care Choice: Resumption of Svcs/PTA Provider                    HH Arranged: PT, OT [place] Agency: [place] ([place]) Date HH Agency Contacted: 12/01/20   Representative spoke with at [place]: Sent secure email to [place] (SDOH) Interventions     Readmission Risk Interventions Readmission Risk Prevention Plan 12/01/2020  Transportation Screening Complete  Medication Review Press photographer) Complete  PCP or Specialist appointment within 3-5 days of discharge Complete  HRI or [place] Complete  SW Recovery Care/Counseling Consult Complete  [place] Not Applicable  Some recent data might be hidden

## 2020-12-05 NOTE — Discharge Summary (Signed)
[place] [facility] [place] NAME: James [name]    MR#:  [id number]  DATE OF BIRTH:  [birth year]  DATE OF ADMISSION:  11/30/2020 ADMITTING PHYSICIAN: Collier Bullock, MD  DATE OF DISCHARGE: 12/05/2020  PRIMARY CARE PHYSICIAN: [name], MD    ADMISSION DIAGNOSIS:  HCAP (healthcare-associated pneumonia) [J18.9] Sepsis ([place]) [A41.9] Sepsis, due to unspecified organism, unspecified whether acute organ dysfunction present ([place]) [A41.9]  DISCHARGE DIAGNOSIS:  Sepsis POA--resolved Pneumonia   SECONDARY DIAGNOSIS:   Past Medical History:  Diagnosis Date   CLL (chronic lymphocytic leukemia) ([place])    Constipation    Depression    Diabetes mellitus without complication (HCC)    GERD (gastroesophageal reflux disease)    Hematuria    Hyperlipidemia    Hypertension    Therapeutic opioid induced constipation     HOSPITAL COURSE:  70 year old gentleman with prior history of CLL on chemotherapy, chronic abdominal pain and constipation, anemia of chronic disease, adrenal insufficiency presented with sepsis and left lower lobe pneumonia.   Sepsis secondary left lower lobe pneumonia -- symptoms improved. -- white count normal. Afebrile. Patient on room air with good sats -- completed oral antibiotic for total five days.   Staph epidermis/hominis bacteremia -- appears contamination. -- discussed with infectious disease-- treat pneumonia only -- repeat blood cultures negative   chronic abdominal pain unclear etiology -- patient is on intrathecal pain pump follows with Dr. Dossie Arbour at the pain clinic   history of adrenal insufficiency -- continue hydrocortisone   CLL -- on Ibrutinib-- managed by Dr. Rogue Bussing   anemia of chronic disease -- status post one unit blood transfusion -- hemoglobin 8.4   generalized weakness. Physical therapy recommends home health PT   discussed at length with patient's son James [name] on the phone. Patient  does not meet rehab criteria. Options about private caregivers/sitter and/or assisted living discussed with patient's son. Social worker has given patient's son ALF list   Procedures: Family communication : James [name] 12/04/2020 Consults : ID, oncology CODE STATUS: DNR DVT Prophylaxis : SCD Level of care: Med-Surg Status is: Inpatient     Dispo: The patient is from: Home              Anticipated d/c is to: Home              Patient currently is medically stable to d/c.              Difficult to place patient No   D/c today with resumption of HH services CONSULTS OBTAINED:  Treatment Team:  Tsosie Billing, MD  DRUG ALLERGIES:  No Known Allergies  DISCHARGE MEDICATIONS:   Allergies as of 12/05/2020   No Known Allergies      Medication List     TAKE these medications    AMBULATORY NON FORMULARY MEDICATION Medication Name: Intrathecal pump Bupivacaine 20.0 mg/ml Fentanyl 2,000.0 mcg/ml Dose 1846.4 mcg/day   Ensure Take 237 mLs by mouth 3 (three) times daily between meals.   folic acid 1 MG tablet Commonly known as: FOLVITE Take 1 tablet (1 mg total) by mouth daily.   gabapentin 300 MG capsule Commonly known as: Neurontin Take 3 capsules (900 mg total) by mouth at bedtime AND 1 capsule (300 mg total) 2 (two) times daily.   hydrocortisone 10 MG tablet Commonly known as: CORTEF Take 3 tablets (30 mg total) by mouth daily.   hydrocortisone 20 MG tablet Commonly known as: CORTEF Take 1 tablet (20 mg  total) by mouth every evening.   Imbruvica 420 MG Tabs Generic drug: ibrutinib Take 420 mg by mouth daily.   metFORMIN 1000 MG tablet Commonly known as: GLUCOPHAGE Take 1 tablet (1,000 mg total) by mouth daily with breakfast.   multivitamin with minerals Tabs tablet Take 1 tablet by mouth daily.   naloxegol oxalate 25 MG Tabs tablet Commonly known as: Movantik Take 1 tablet (25 mg total) by mouth daily.   naloxone 2 MG/2ML injection Commonly known  as: NARCAN Inject 1 mL (1 mg total) into the muscle as needed for up to 2 doses (for opioid overdose). In case of emergency (overdose), inject into muscle of upper arm or leg and call 911.   Oxycodone HCl 10 MG Tabs Take 1 tablet (10 mg total) by mouth every 4 (four) hours as needed. Must last 30 days Start taking on: January 07, 2021 What changed: Another medication with the same name was removed. Continue taking this medication, and follow the directions you see here.   pantoprazole 40 MG tablet Commonly known as: PROTONIX Take 40 mg by mouth at bedtime.        If you experience worsening of your admission symptoms, develop shortness of breath, life threatening emergency, suicidal or homicidal thoughts you must seek medical attention immediately by calling 911 or calling your MD immediately  if symptoms less severe.  You Must read complete instructions/literature along with all the possible adverse reactions/side effects for all the Medicines you take and that have been prescribed to you. Take any new Medicines after you have completely understood and accept all the possible adverse reactions/side effects.   Please note  You were cared for by a hospitalist during your hospital stay. If you have any questions about your discharge medications or the care you received while you were in the hospital after you are discharged, you can call the unit and asked to speak with the hospitalist on call if the hospitalist that took care of you is not available. Once you are discharged, your primary care physician will handle any further medical issues. Please note that NO REFILLS for any discharge medications will be authorized once you are discharged, as it is imperative that you return to your primary care physician (or establish a relationship with a primary care physician if you do not have one) for your aftercare needs so that they can reassess your need for medications and monitor your lab  values. Today   SUBJECTIVE   Eating ok. Chronic abd pain  VITAL SIGNS:  Blood pressure (!) 164/64, pulse (!) 53, temperature 98.6 F (37 C), resp. rate 18, height 5\' 8"  (1.727 m), weight 59 kg, SpO2 98 %.  I/O:   Intake/Output Summary (Last 24 hours) at 12/05/2020 0836 Last data filed at 12/04/2020 2129 Gross per 24 hour  Intake 200 ml  Output 950 ml  Net -750 ml    PHYSICAL EXAMINATION:  GENERAL:  70 y.o.-year-old patient lying in the bed with no acute distress.  LUNGS: Normal breath sounds bilaterally, no wheezing, rales,rhonchi or crepitation. No use of accessory muscles of respiration.  CARDIOVASCULAR: S1, S2 normal. No murmurs, rubs, or gallops.  ABDOMEN: Soft, non-tender, non-distended. Bowel sounds present. No organomegaly or mass.  EXTREMITIES: No pedal edema, cyanosis, or clubbing.  NEUROLOGIC: non focal PSYCHIATRIC: The patient is alert and oriented x 3.  SKIN: No obvious rash, lesion, or ulcer.   DATA REVIEW:   CBC  Recent Labs  Lab 12/03/20 0456  WBC 11.9*  HGB 8.4*  HCT 25.1*  PLT 61*    Chemistries  Recent Labs  Lab 11/30/20 1432 12/01/20 0443 12/03/20 0456  NA  --    < > 141  K  --    < > 3.8  CL  --    < > 107  CO2  --    < > 28  GLUCOSE  --    < > 158*  BUN  --    < > 25*  CREATININE  --    < > 0.83  CALCIUM  --    < > 8.4*  AST 21  --   --   ALT 12  --   --   ALKPHOS 71  --   --   BILITOT 2.6*  --   --    < > = values in this interval not displayed.    Microbiology Results   Recent Results (from the past 240 hour(s))  Blood Culture (routine x 2)     Status: Abnormal   Collection Time: 11/30/20  2:32 PM   Specimen: BLOOD  Result Value Ref Range Status   Specimen Description   Final    BLOOD LEFT ANTECUBITAL Performed at [facility], [address] [place], [place] [address]    Special Requests   Final    BOTTLES DRAWN AEROBIC AND ANAEROBIC Blood Culture adequate volume Performed at [facility], [address] [place], [place] [address]    Culture  Setup Time   Final    Organism ID to follow GRAM POSITIVE COCCI IN BOTH AEROBIC AND ANAEROBIC BOTTLES CRITICAL RESULT CALLED TO, READ BACK BY AND VERIFIED WITH: CHARLES SHANLEVER AT 1010 12/01/20.PMF Performed at [facility], [address] [place],  [address]    Culture (A)  Final    STAPHYLOCOCCUS EPIDERMIDIS STAPHYLOCOCCUS HOMINIS THE SIGNIFICANCE OF ISOLATING THIS ORGANISM FROM A SINGLE VENIPUNCTURE CANNOT BE PREDICTED WITHOUT FURTHER CLINICAL AND CULTURE CORRELATION. SUSCEPTIBILITIES AVAILABLE ONLY ON REQUEST. Performed at [facility], [place] [address], [place],  [address]    Report Status 12/03/2020 FINAL  Final  Resp Panel by RT-PCR (Flu A&B, Covid) Nasopharyngeal Swab     Status: None   Collection Time: 11/30/20  2:32 PM   Specimen: Nasopharyngeal Swab; Nasopharyngeal(NP) swabs in vial transport medium  Result Value Ref Range Status   SARS Coronavirus 2 by RT PCR NEGATIVE NEGATIVE Final    Comment: (NOTE) SARS-CoV-2 target nucleic acids are NOT DETECTED.  The SARS-CoV-2 RNA is generally detectable in upper respiratory specimens during the acute phase of infection. The lowest concentration of SARS-CoV-2 viral copies this assay can detect is 138 copies/mL. A negative result does not preclude SARS-Cov-2 infection and should not be used as the sole basis for treatment or other patient management decisions. A negative result may occur with  improper specimen collection/handling, submission of specimen other than nasopharyngeal swab, presence of viral mutation(s) within the areas targeted by this assay, and inadequate number of viral copies(<138 copies/mL). A negative result must be combined with clinical observations, patient history, and epidemiological information. The expected result is Negative.  Fact Sheet for Patients:  EntrepreneurPulse.com.au  Fact Sheet for Healthcare  Providers:  IncredibleEmployment.be  This test is no t yet approved or cleared by the [place] FDA and  has been authorized for detection and/or diagnosis of SARS-CoV-2 by FDA under an Emergency Use Authorization (EUA). This EUA will remain  in effect (meaning this test can be  used) for the duration of the COVID-19 declaration under Section 564(b)(1) of the Act, 21 U.S.C.section 360bbb-3(b)(1), unless the authorization is terminated  or revoked sooner.       Influenza A by PCR NEGATIVE NEGATIVE Final   Influenza B by PCR NEGATIVE NEGATIVE Final    Comment: (NOTE) The Xpert Xpress SARS-CoV-2/FLU/RSV plus assay is intended as an aid in the diagnosis of influenza from Nasopharyngeal swab specimens and should not be used as a sole basis for treatment. Nasal washings and aspirates are unacceptable for Xpert Xpress SARS-CoV-2/FLU/RSV testing.  Fact Sheet for Patients: EntrepreneurPulse.com.au  Fact Sheet for Healthcare Providers: IncredibleEmployment.be  This test is not yet approved or cleared by the [place] FDA and has been authorized for detection and/or diagnosis of SARS-CoV-2 by FDA under an Emergency Use Authorization (EUA). This EUA will remain in effect (meaning this test can be used) for the duration of the COVID-19 declaration under Section 564(b)(1) of the Act, 21 U.S.C. section 360bbb-3(b)(1), unless the authorization is terminated or revoked.  Performed at [facility], [address] [place], [place] [address]   Blood Culture ID Panel (Reflexed)     Status: Abnormal   Collection Time: 11/30/20  2:32 PM  Result Value Ref Range Status   Enterococcus faecalis NOT DETECTED NOT DETECTED Final   Enterococcus Faecium NOT DETECTED NOT DETECTED Final   Listeria monocytogenes NOT DETECTED NOT DETECTED Final   Staphylococcus species DETECTED (A) NOT DETECTED Final    Comment: CRITICAL RESULT CALLED  TO, READ BACK BY AND VERIFIED WITH: CHARLES SHANLEVER AT 1010 12/01/20.PMF    Staphylococcus aureus (BCID) NOT DETECTED NOT DETECTED Final   Staphylococcus epidermidis DETECTED (A) NOT DETECTED Final    Comment: CRITICAL RESULT CALLED TO, READ BACK BY AND VERIFIED WITH: CHARLES SHANLEVER AT 1010 12/01/20.PMF    Staphylococcus lugdunensis NOT DETECTED NOT DETECTED Final   Streptococcus species NOT DETECTED NOT DETECTED Final   Streptococcus agalactiae NOT DETECTED NOT DETECTED Final   Streptococcus pneumoniae NOT DETECTED NOT DETECTED Final   Streptococcus pyogenes NOT DETECTED NOT DETECTED Final   A.calcoaceticus-baumannii NOT DETECTED NOT DETECTED Final   Bacteroides fragilis NOT DETECTED NOT DETECTED Final   Enterobacterales NOT DETECTED NOT DETECTED Final   Enterobacter cloacae complex NOT DETECTED NOT DETECTED Final   Escherichia coli NOT DETECTED NOT DETECTED Final   Klebsiella aerogenes NOT DETECTED NOT DETECTED Final   Klebsiella oxytoca NOT DETECTED NOT DETECTED Final   Klebsiella pneumoniae NOT DETECTED NOT DETECTED Final   Proteus species NOT DETECTED NOT DETECTED Final   Salmonella species NOT DETECTED NOT DETECTED Final   Serratia marcescens NOT DETECTED NOT DETECTED Final   Haemophilus influenzae NOT DETECTED NOT DETECTED Final   Neisseria meningitidis NOT DETECTED NOT DETECTED Final   Pseudomonas aeruginosa NOT DETECTED NOT DETECTED Final   Stenotrophomonas maltophilia NOT DETECTED NOT DETECTED Final   Candida albicans NOT DETECTED NOT DETECTED Final   Candida auris NOT DETECTED NOT DETECTED Final   Candida glabrata NOT DETECTED NOT DETECTED Final   Candida krusei NOT DETECTED NOT DETECTED Final   Candida parapsilosis NOT DETECTED NOT DETECTED Final   Candida tropicalis NOT DETECTED NOT DETECTED Final   Cryptococcus neoformans/gattii NOT DETECTED NOT DETECTED Final   Methicillin resistance mecA/C NOT DETECTED NOT DETECTED Final    Comment: Performed at [facility], [address] [place], [place] [address]  Urine Culture     Status: None   Collection Time: 12/01/20  6:00 AM  Specimen: In/Out Cath Urine  Result Value Ref Range Status   Specimen Description   Final    IN/OUT CATH URINE Performed at [facility], [address] [place], [place] [address]    Special Requests   Final    NONE Performed at [facility], [address], [place], [place] [address]    Culture   Final    NO GROWTH Performed at [facility], [place] [address] [place], [place] [address]    Report Status 12/02/2020 FINAL  Final  CULTURE, BLOOD (ROUTINE X 2) w Reflex to ID Panel     Status: None (Preliminary result)   Collection Time: 12/01/20 11:12 AM   Specimen: BLOOD  Result Value Ref Range Status   Specimen Description BLOOD RIGHT ANTECUBITAL  Final   Special Requests   Final    BOTTLES DRAWN AEROBIC AND ANAEROBIC Blood Culture adequate volume   Culture   Final    NO GROWTH 4 DAYS Performed at [facility], [address] [place], [place] [address]    Report Status PENDING  Incomplete  CULTURE, BLOOD (ROUTINE X 2) w Reflex to ID Panel     Status: None (Preliminary result)   Collection Time: 12/01/20 11:23 AM   Specimen: BLOOD LEFT HAND  Result Value Ref Range Status   Specimen Description BLOOD LEFT HAND  Final   Special Requests   Final    BOTTLES DRAWN AEROBIC AND ANAEROBIC Blood Culture adequate volume   Culture   Final    NO GROWTH 4 DAYS Performed at [facility], [address] [place], [place] [address]    Report Status PENDING  Incomplete  MRSA Next Gen by PCR, Nasal     Status: None   Collection Time: 12/01/20  6:49 PM   Specimen: Nasal Mucosa; Nasal Swab  Result Value Ref Range Status   MRSA by PCR Next Gen NOT DETECTED NOT DETECTED Final    Comment: (NOTE) The GeneXpert MRSA Assay (FDA approved for NASAL specimens only), is one component of a comprehensive MRSA colonization  surveillance program. It is not intended to diagnose MRSA infection nor to guide or monitor treatment for MRSA infections. Test performance is not FDA approved in patients less than 56 years old. Performed at [facility], [address] [place], [place] [address]     RADIOLOGY:  No results found.   CODE STATUS:     Code Status Orders  (From admission, onward)           Start     Ordered   11/30/20 1617  Do not attempt resuscitation (DNR)  Continuous       Question Answer Comment  In the event of cardiac or respiratory ARREST Do not call a "code blue"   In the event of cardiac or respiratory ARREST Do not perform Intubation, CPR, defibrillation or ACLS   In the event of cardiac or respiratory ARREST Use medication by any route, position, wound care, and other measures to relive pain and suffering. May use oxygen, suction and manual treatment of airway obstruction as needed for comfort.   Comments CODE STATUS was discussed with patient and he is a DNR      11/30/20 1623           Code Status History     Date Active Date Inactive Code Status Order ID Comments User Context   11/21/2020 1647 11/25/2020 1824 DNR [id number]  Collier Bullock, MD ED  12/28/2018 1824 12/30/2018 1909 DNR [id number]  Vaughan Basta, MD ED   08/10/2018 1126 08/10/2018 1625 Full Code [id number]  Algernon Huxley, MD Inpatient   05/19/2017 1435 05/19/2017 1903 Full Code [id number]  Algernon Huxley, MD Inpatient        TOTAL TIME TAKING CARE OF THIS PATIENT: 40 minutes.    [name] M.D  [facility]    CC: Primary care physician; [name], MD

## 2020-12-05 NOTE — Telephone Encounter (Signed)
Oral Oncology Pharmacist Encounter  Received new prescription for Calquence (acalabrutinib) for the treatment of CLL, planned duration until disease progression or unacceptable drug toxicity. Patient is switching from ibrutinib to acalabrutinib due to his AV malformation/GI bleed.  Prescription dose and frequency assessed.   Current medication list in Epic reviewed, one DDIs with acalabrutinib identified: - Pantoprazole: PPIs may decrease the concentrations of acalabrutinib. PPIs should be discontinued, if acid suppression is needed, H2 antagonist and antacids can be used f separated appropriately.   Evaluated chart and no patient barriers to medication adherence identified.   Prescription has been e-scribed to the [facility] for benefits analysis and approval.  Oral Oncology Clinic will continue to follow for insurance authorization, copayment issues, initial counseling and start date.   Darl Pikes, PharmD, BCPS, BCOP, CPP Hematology/Oncology Clinical Pharmacist Practitioner [facility]/HP/AP [facility] [phone]  12/05/2020 1:29 PM

## 2020-12-06 LAB — CULTURE, BLOOD (ROUTINE X 2)
Culture: NO GROWTH
Culture: NO GROWTH
Special Requests: ADEQUATE
Special Requests: ADEQUATE

## 2020-12-08 ENCOUNTER — Other Ambulatory Visit: Payer: Self-pay | Admitting: *Deleted

## 2020-12-08 ENCOUNTER — Telehealth: Payer: Self-pay | Admitting: *Deleted

## 2020-12-08 DIAGNOSIS — C9110 Chronic lymphocytic leukemia of B-cell type not having achieved remission: Secondary | ICD-10-CM

## 2020-12-08 NOTE — Telephone Encounter (Signed)
Incoming call from patient's son. Patient needs follow-up for lab/md/ possible aranesp vs 1 unit of blood next Friday 12/15/20. Patient unable to come until next Friday.  Patient's son given apts from next Friday 8/19 at 915 am.

## 2020-12-15 ENCOUNTER — Inpatient Hospital Stay: Payer: Medicare Other | Admitting: Internal Medicine

## 2020-12-15 ENCOUNTER — Emergency Department: Payer: Medicare Other

## 2020-12-15 ENCOUNTER — Inpatient Hospital Stay: Payer: Medicare Other

## 2020-12-15 ENCOUNTER — Inpatient Hospital Stay
Admission: EM | Admit: 2020-12-15 | Discharge: 2020-12-20 | DRG: 682 | Disposition: A | Discharge status: Home / Self Care | Payer: Medicare Other | Attending: Internal Medicine | Admitting: Internal Medicine

## 2020-12-15 ENCOUNTER — Other Ambulatory Visit: Payer: Self-pay

## 2020-12-15 DIAGNOSIS — M5137 Other intervertebral disc degeneration, lumbosacral region: Secondary | ICD-10-CM | POA: Diagnosis present

## 2020-12-15 DIAGNOSIS — E872 Acidosis, unspecified: Secondary | ICD-10-CM | POA: Diagnosis present

## 2020-12-15 DIAGNOSIS — E2740 Unspecified adrenocortical insufficiency: Secondary | ICD-10-CM | POA: Diagnosis present

## 2020-12-15 DIAGNOSIS — G894 Chronic pain syndrome: Secondary | ICD-10-CM | POA: Diagnosis present

## 2020-12-15 DIAGNOSIS — R531 Weakness: Secondary | ICD-10-CM

## 2020-12-15 DIAGNOSIS — N179 Acute kidney failure, unspecified: Principal | ICD-10-CM | POA: Diagnosis present

## 2020-12-15 DIAGNOSIS — Z7984 Long term (current) use of oral hypoglycemic drugs: Secondary | ICD-10-CM

## 2020-12-15 DIAGNOSIS — F32A Depression, unspecified: Secondary | ICD-10-CM | POA: Diagnosis present

## 2020-12-15 DIAGNOSIS — R627 Adult failure to thrive: Secondary | ICD-10-CM | POA: Diagnosis present

## 2020-12-15 DIAGNOSIS — N1831 Chronic kidney disease, stage 3a: Secondary | ICD-10-CM | POA: Diagnosis present

## 2020-12-15 DIAGNOSIS — R1013 Epigastric pain: Secondary | ICD-10-CM | POA: Diagnosis present

## 2020-12-15 DIAGNOSIS — E1122 Type 2 diabetes mellitus with diabetic chronic kidney disease: Secondary | ICD-10-CM | POA: Diagnosis present

## 2020-12-15 DIAGNOSIS — E43 Unspecified severe protein-calorie malnutrition: Secondary | ICD-10-CM | POA: Diagnosis present

## 2020-12-15 DIAGNOSIS — R55 Syncope and collapse: Secondary | ICD-10-CM | POA: Diagnosis not present

## 2020-12-15 DIAGNOSIS — J189 Pneumonia, unspecified organism: Secondary | ICD-10-CM | POA: Diagnosis present

## 2020-12-15 DIAGNOSIS — C9110 Chronic lymphocytic leukemia of B-cell type not having achieved remission: Secondary | ICD-10-CM | POA: Diagnosis present

## 2020-12-15 DIAGNOSIS — Z515 Encounter for palliative care: Secondary | ICD-10-CM | POA: Insufficient documentation

## 2020-12-15 DIAGNOSIS — N183 Chronic kidney disease, stage 3 unspecified: Secondary | ICD-10-CM

## 2020-12-15 DIAGNOSIS — R7989 Other specified abnormal findings of blood chemistry: Secondary | ICD-10-CM

## 2020-12-15 DIAGNOSIS — Z8249 Family history of ischemic heart disease and other diseases of the circulatory system: Secondary | ICD-10-CM

## 2020-12-15 DIAGNOSIS — E119 Type 2 diabetes mellitus without complications: Secondary | ICD-10-CM

## 2020-12-15 DIAGNOSIS — D696 Thrombocytopenia, unspecified: Secondary | ICD-10-CM | POA: Diagnosis present

## 2020-12-15 DIAGNOSIS — D539 Nutritional anemia, unspecified: Secondary | ICD-10-CM | POA: Diagnosis present

## 2020-12-15 DIAGNOSIS — E869 Volume depletion, unspecified: Secondary | ICD-10-CM | POA: Diagnosis present

## 2020-12-15 DIAGNOSIS — I129 Hypertensive chronic kidney disease with stage 1 through stage 4 chronic kidney disease, or unspecified chronic kidney disease: Secondary | ICD-10-CM | POA: Diagnosis present

## 2020-12-15 DIAGNOSIS — E785 Hyperlipidemia, unspecified: Secondary | ICD-10-CM | POA: Diagnosis present

## 2020-12-15 DIAGNOSIS — K219 Gastro-esophageal reflux disease without esophagitis: Secondary | ICD-10-CM | POA: Diagnosis present

## 2020-12-15 DIAGNOSIS — F17210 Nicotine dependence, cigarettes, uncomplicated: Secondary | ICD-10-CM | POA: Diagnosis present

## 2020-12-15 DIAGNOSIS — A0839 Other viral enteritis: Secondary | ICD-10-CM | POA: Diagnosis not present

## 2020-12-15 DIAGNOSIS — Z681 Body mass index (BMI) 19 or less, adult: Secondary | ICD-10-CM

## 2020-12-15 DIAGNOSIS — Z20822 Contact with and (suspected) exposure to covid-19: Secondary | ICD-10-CM | POA: Diagnosis present

## 2020-12-15 DIAGNOSIS — I10 Essential (primary) hypertension: Secondary | ICD-10-CM | POA: Diagnosis present

## 2020-12-15 DIAGNOSIS — M5134 Other intervertebral disc degeneration, thoracic region: Secondary | ICD-10-CM | POA: Diagnosis present

## 2020-12-15 DIAGNOSIS — Z66 Do not resuscitate: Secondary | ICD-10-CM | POA: Diagnosis present

## 2020-12-15 DIAGNOSIS — D631 Anemia in chronic kidney disease: Secondary | ICD-10-CM | POA: Diagnosis present

## 2020-12-15 DIAGNOSIS — Z79899 Other long term (current) drug therapy: Secondary | ICD-10-CM

## 2020-12-15 DIAGNOSIS — E273 Drug-induced adrenocortical insufficiency: Secondary | ICD-10-CM | POA: Diagnosis present

## 2020-12-15 DIAGNOSIS — R197 Diarrhea, unspecified: Secondary | ICD-10-CM

## 2020-12-15 HISTORY — DX: Acidosis, unspecified: E87.20

## 2020-12-15 HISTORY — DX: Acidosis: E87.2

## 2020-12-15 LAB — CBC
HCT: 32.1 % — ABNORMAL LOW (ref 39.0–52.0)
Hemoglobin: 10.1 g/dL — ABNORMAL LOW (ref 13.0–17.0)
MCH: 33.4 pg (ref 26.0–34.0)
MCHC: 31.5 g/dL (ref 30.0–36.0)
MCV: 106.3 fL — ABNORMAL HIGH (ref 80.0–100.0)
Platelets: 211 10*3/uL (ref 150–400)
RBC: 3.02 MIL/uL — ABNORMAL LOW (ref 4.22–5.81)
RDW: 20.1 % — ABNORMAL HIGH (ref 11.5–15.5)
WBC: 17.9 10*3/uL — ABNORMAL HIGH (ref 4.0–10.5)
nRBC: 0 % (ref 0.0–0.2)

## 2020-12-15 LAB — URINALYSIS, COMPLETE (UACMP) WITH MICROSCOPIC
Bacteria, UA: NONE SEEN
Bilirubin Urine: NEGATIVE
Glucose, UA: NEGATIVE mg/dL
Hgb urine dipstick: NEGATIVE
Ketones, ur: 5 mg/dL — AB
Nitrite: NEGATIVE
Protein, ur: NEGATIVE mg/dL
Specific Gravity, Urine: 1.018 (ref 1.005–1.030)
pH: 6 (ref 5.0–8.0)

## 2020-12-15 LAB — RESP PANEL BY RT-PCR (FLU A&B, COVID) ARPGX2
Influenza A by PCR: NEGATIVE
Influenza B by PCR: NEGATIVE
SARS Coronavirus 2 by RT PCR: NEGATIVE

## 2020-12-15 LAB — COMPREHENSIVE METABOLIC PANEL
ALT: 24 U/L (ref 0–44)
AST: 21 U/L (ref 15–41)
Albumin: 3.9 g/dL (ref 3.5–5.0)
Alkaline Phosphatase: 101 U/L (ref 38–126)
Anion gap: 18 — ABNORMAL HIGH (ref 5–15)
BUN: 28 mg/dL — ABNORMAL HIGH (ref 8–23)
CO2: 22 mmol/L (ref 22–32)
Calcium: 8.8 mg/dL — ABNORMAL LOW (ref 8.9–10.3)
Chloride: 100 mmol/L (ref 98–111)
Creatinine, Ser: 1.74 mg/dL — ABNORMAL HIGH (ref 0.61–1.24)
GFR, Estimated: 41 mL/min — ABNORMAL LOW (ref >60)
Glucose, Bld: 91 mg/dL (ref 70–99)
Potassium: 4.4 mmol/L (ref 3.5–5.1)
Sodium: 140 mmol/L (ref 135–145)
Total Bilirubin: 1.8 mg/dL — ABNORMAL HIGH (ref 0.3–1.2)
Total Protein: 6.7 g/dL (ref 6.5–8.1)

## 2020-12-15 LAB — LACTIC ACID, PLASMA
Lactic Acid, Venous: 3 mmol/L (ref 0.5–1.9)
Lactic Acid, Venous: 1.3 mmol/L (ref 0.5–1.9)

## 2020-12-15 LAB — TROPONIN I (HIGH SENSITIVITY): Troponin I (High Sensitivity): 10 ng/L (ref <18)

## 2020-12-15 LAB — LIPASE, BLOOD: Lipase: 22 U/L (ref 11–51)

## 2020-12-15 MED ORDER — SODIUM CHLORIDE 0.9 % IV BOLUS
500.0000 mL | Freq: Once | INTRAVENOUS | Status: AC
  Administered 2020-12-15: 500 mL via INTRAVENOUS

## 2020-12-15 MED ORDER — LACTATED RINGERS IV SOLN: INTRAVENOUS | Status: DC

## 2020-12-15 MED ORDER — ACETAMINOPHEN 650 MG RE SUPP: 650.0000 mg | Freq: Four times a day (QID) | RECTAL | Status: DC | PRN

## 2020-12-15 MED ORDER — ADULT MULTIVITAMIN W/MINERALS CH
1.0000 | ORAL_TABLET | Freq: Every day | ORAL | Status: DC
  Administered 2020-12-15 – 2020-12-20 (×6): 1 via ORAL
  Filled 2020-12-15 (×6): qty 1

## 2020-12-15 MED ORDER — ENOXAPARIN SODIUM 30 MG/0.3ML IJ SOSY
30.0000 mg | PREFILLED_SYRINGE | INTRAMUSCULAR | Status: DC
  Administered 2020-12-15: 30 mg via SUBCUTANEOUS
  Filled 2020-12-15: qty 0.3

## 2020-12-15 MED ORDER — HYDROCORTISONE 10 MG PO TABS
30.0000 mg | ORAL_TABLET | Freq: Every day | ORAL | Status: DC
  Administered 2020-12-16 – 2020-12-20 (×5): 30 mg via ORAL
  Filled 2020-12-15 (×5): qty 3

## 2020-12-15 MED ORDER — ONDANSETRON HCL 4 MG PO TABS: 4.0000 mg | ORAL_TABLET | Freq: Four times a day (QID) | ORAL | Status: DC | PRN

## 2020-12-15 MED ORDER — ACETAMINOPHEN 325 MG PO TABS
650.0000 mg | ORAL_TABLET | Freq: Four times a day (QID) | ORAL | Status: DC | PRN
  Administered 2020-12-19: 22:00:00 650 mg via ORAL
  Filled 2020-12-15: qty 2

## 2020-12-15 MED ORDER — ENSURE ENLIVE PO LIQD
237.0000 mL | Freq: Three times a day (TID) | ORAL | Status: DC
  Administered 2020-12-17: 10:00:00 237 mL via ORAL

## 2020-12-15 MED ORDER — MORPHINE SULFATE (PF) 4 MG/ML IV SOLN
4.0000 mg | Freq: Once | INTRAVENOUS | Status: AC
  Administered 2020-12-15: 4 mg via INTRAVENOUS
  Filled 2020-12-15: qty 1

## 2020-12-15 MED ORDER — NALOXEGOL OXALATE 25 MG PO TABS
25.0000 mg | ORAL_TABLET | Freq: Every day | ORAL | Status: DC
  Administered 2020-12-15 – 2020-12-20 (×6): 25 mg via ORAL
  Filled 2020-12-15 (×7): qty 1

## 2020-12-15 MED ORDER — ONDANSETRON HCL 4 MG/2ML IJ SOLN
4.0000 mg | Freq: Once | INTRAMUSCULAR | Status: AC
  Administered 2020-12-15: 4 mg via INTRAVENOUS
  Filled 2020-12-15: qty 2

## 2020-12-15 MED ORDER — OXYCODONE HCL 5 MG PO TABS
10.0000 mg | ORAL_TABLET | Freq: Four times a day (QID) | ORAL | Status: DC | PRN
  Administered 2020-12-15 – 2020-12-20 (×10): 10 mg via ORAL
  Filled 2020-12-15 (×10): qty 2

## 2020-12-15 MED ORDER — FOLIC ACID 1 MG PO TABS
1.0000 mg | ORAL_TABLET | Freq: Every day | ORAL | Status: DC
  Administered 2020-12-16 – 2020-12-20 (×5): 1 mg via ORAL
  Filled 2020-12-15 (×5): qty 1

## 2020-12-15 MED ORDER — ONDANSETRON HCL 4 MG/2ML IJ SOLN: 4.0000 mg | Freq: Four times a day (QID) | INTRAMUSCULAR | Status: DC | PRN

## 2020-12-15 MED ORDER — PANTOPRAZOLE SODIUM 40 MG PO TBEC
40.0000 mg | DELAYED_RELEASE_TABLET | Freq: Every day | ORAL | Status: DC
  Administered 2020-12-15 – 2020-12-19 (×5): 40 mg via ORAL
  Filled 2020-12-15 (×5): qty 1

## 2020-12-15 MED ORDER — ENOXAPARIN SODIUM 40 MG/0.4ML IJ SOSY: 40.0000 mg | PREFILLED_SYRINGE | INTRAMUSCULAR | Status: DC

## 2020-12-15 MED ORDER — GABAPENTIN 300 MG PO CAPS
300.0000 mg | ORAL_CAPSULE | Freq: Two times a day (BID) | ORAL | Status: DC
  Administered 2020-12-15 – 2020-12-20 (×11): 300 mg via ORAL
  Filled 2020-12-15 (×11): qty 1

## 2020-12-15 MED ORDER — HYDROCORTISONE 10 MG PO TABS
20.0000 mg | ORAL_TABLET | Freq: Every evening | ORAL | Status: DC
  Administered 2020-12-15 – 2020-12-19 (×5): 20 mg via ORAL
  Filled 2020-12-15 (×7): qty 2

## 2020-12-15 NOTE — ED Triage Notes (Addendum)
Pt comes via EMs with c/o syncopal episode today. Pt states pt c/o abdominal pain. Pt is nonambulatory.  Pt is on pain pump per EMs. Last doses at 10pm  Stage 4 leukemia. Pt appears pale. Pt states 9/1- pain. Pt is hunched over at this time in wheelchair.  HR-120 130/90 CBG-99 02-100% RA

## 2020-12-15 NOTE — Progress Notes (Signed)
[place]  [phone] [phone]   Name: James [name] Date: 12/15/2020 MRN: [id number]  DOB: 10-29-1950  Patient Care Team: [name], MD as PCP - General (Internal Medicine) Cammie Sickle, MD as Consulting Physician (Hematology and Oncology) Shaleigh Laubscher, Kirt Boys, NP as Nurse Practitioner (Hospice and Palliative Medicine) Verlon Au, NP as Nurse Practitioner (Hematology and Oncology) Jacquelin Hawking, NP as Nurse Practitioner (Oncology) Lonell Face, NP as Nurse Practitioner (Neurosurgery) Milinda Pointer, MD as Consulting Physician (Pain Medicine)    REASON FOR CONSULTATION: James [name] is a 70 y.o. male with multiple medical problems including stage IV CLL (initially diagnosed in 2006) most recently treated with obinutuzumab.  Patient has had severe and chronic abdominal pain of unclear etiology.  He has had extensive work-up including referral to [facility].  He has been followed by interventional pain management and is status post intrathecal pain pump (placed 02/07/2020).  Patient was hospitalized 11/21/2020 to 11/25/2020 with symptomatic anemia thought secondary to slow GI bleed with AVM noted on EGD.  He was again hospitalized 11/30/2020 to 12/05/2020 with sepsis from HCAP.  Patient is now readmitted 12/15/2020 with syncope and AKI.  He was referred to palliative care to help address goals and manage ongoing symptoms.  SOCIAL HISTORY:     reports that he has been smoking cigarettes. He has a 23.50 pack-year smoking history. He has never used smokeless tobacco. He reports current alcohol use of about 1.0 standard drink per week. He reports that he does not use drugs.  Patient is married and lives at home with his wife.  He has a son in [place].  Patient formally worked as a Administrator.  ADVANCE DIRECTIVES:  Not on file  CODE STATUS: DNR/DNI (MOST form completed on 07/08/19  PAST MEDICAL  HISTORY: Past Medical History:  Diagnosis Date   CLL (chronic lymphocytic leukemia) ([place])    Constipation    Depression    Diabetes mellitus without complication (HCC)    GERD (gastroesophageal reflux disease)    Hematuria    Hyperlipidemia    Hypertension    Lactic acidosis 12/15/2020   Therapeutic opioid induced constipation     PAST SURGICAL HISTORY:  Past Surgical History:  Procedure Laterality Date   CATARACT EXTRACTION W/ INTRAOCULAR LENS IMPLANT Bilateral    CHOLECYSTECTOMY  1983   COLONOSCOPY WITH PROPOFOL N/A 10/27/2017   Procedure: COLONOSCOPY WITH PROPOFOL;  Surgeon: Manya Silvas, MD;  Location: [facility] ENDOSCOPY;  Service: Endoscopy;  Laterality: N/A;   ESOPHAGOGASTRODUODENOSCOPY (EGD) WITH PROPOFOL N/A 11/20/2016   Procedure: ESOPHAGOGASTRODUODENOSCOPY (EGD) WITH PROPOFOL;  Surgeon: Manya Silvas, MD;  Location: [facility] ENDOSCOPY;  Service: Endoscopy;  Laterality: N/A;   ESOPHAGOGASTRODUODENOSCOPY (EGD) WITH PROPOFOL N/A 10/27/2017   Procedure: ESOPHAGOGASTRODUODENOSCOPY (EGD) WITH PROPOFOL;  Surgeon: Manya Silvas, MD;  Location[facility] ENDOSCOPY;  Service: Endoscopy;  Laterality: N/A;   ESOPHAGOGASTRODUODENOSCOPY (EGD) WITH PROPOFOL N/A 11/23/2020   Procedure: ESOPHAGOGASTRODUODENOSCOPY (EGD) WITH PROPOFOL;  Surgeon: Jonathon Bellows, MD;  Location: [facility] ENDOSCOPY;  Service: Gastroenterology;  Laterality: N/A;   INTRATHECAL PUMP IMPLANT Left 02/07/2020   Procedure: INTRATHECAL PUMP & CATHETER IMPLANT;  Surgeon: Deetta Perla, MD;  Location: [facility] ORS;  Service: Neurosurgery;  Laterality: Left;   LOWER EXTREMITY ANGIOGRAPHY Right 05/19/2017   Procedure: LOWER EXTREMITY ANGIOGRAPHY;  Surgeon: Algernon Huxley, MD;  Location: [place] CV LAB;  Service: Cardiovascular;  Laterality: Right;   LOWER EXTREMITY ANGIOGRAPHY Left 08/10/2018   Procedure:  LOWER EXTREMITY ANGIOGRAPHY;  Surgeon: Algernon Huxley, MD;  Location: [place] CV LAB;  Service: Cardiovascular;  Laterality: Left;     HEMATOLOGY/ONCOLOGY HISTORY:  Oncology History Overview Note  # 2006- CLL STAGE IV; MAY 2011- WBC- 57K;Platelets-99;Hb-12/CT Bulky LN; BMBx- 80% Invol; del 11; START Benda-Ritux x4 [finished Sep 2011];   # July 2015-Progression; Sep 2015-START ibrutinib; CT scan DEC 2015- Improvement LN; Cont Ibrutinib 184m/d; NOV 2016 CT- 1-2CM LN [mild progression compared to Dec 2015];NOV 2016- FISH peripheral blood- NO MUTATIONS/CD-38 Positive; NOV 7th- CONT IBRUTINIB 2 pills/day; CT AUG 2017- STABLE;  DEC 6th PET- Mild RP LN/ Retrocrural LN  # OFF ibrutinib [? intol]- sep 2019- Jan 16th 2020; Re-start Ibrutinib; September 2020-stop ibrutinib [poor tolerance/worsening anemia]  # Jan 16th 2020- start aranesp; HOLD while on [place]   # MARCH 11th 2021- Gazyva x 6 finished AUG 2021.   #April May 2022-worsening anemia- BMBx- CLL; cytogenetics: 20 q del [incidental]; rFISH- 17p/11q DEL  # Early May 2022- START IBRUTINIB 420 mg/day  # SEP 2021- prostate enlargement on CT scan- UA-NEG; PSA- WNL; s/p Dr.Wolff.    # DEC 2019- PAIN CONTRACT  # PALLIATIVE CARE- 06/21/2019-  # October 2019-bone marrow biopsy [worsening anemia]-question dyserythropoietic changes/small clone of CLL;  # [facility] HEM- NEG.  SA skin infection [Oct 2016] s/p clinda -------------------------------------------------------    DIAGNOSIS: CLL  STAGE:   IV      ;GOALS: palliative  CURRENT/MOST RECENT THERAPY : Ibrutinib    [place]) [place]  07/08/2019 -  Chemotherapy   The patient had obinutuzumab (GAZYVA) 100 mg in sodium chloride 0.9 % 100 mL (0.9615 mg/mL) chemo infusion, 100 mg, Intravenous, Once, 6 of 6 cycles Administration: 100 mg (07/08/2019), 900 mg (07/09/2019), 1,000 mg (07/26/2019), 1,000 mg (08/16/2019), 1,000 mg (08/02/2019), 1,000 mg (09/13/2019), 1,000 mg (10/11/2019), 1,000 mg (11/09/2019), 1,000 mg (12/07/2019)   for chemotherapy treatment.       ALLERGIES:  has No Known  Allergies.  MEDICATIONS:  Current Facility-Administered Medications  Medication Dose Route Frequency Provider Last Rate Last Admin   acetaminophen (TYLENOL) tablet 650 mg  650 mg Oral Q6H PRN Agbata, Tochukwu, MD       Or   acetaminophen (TYLENOL) suppository 650 mg  650 mg Rectal Q6H PRN Agbata, Tochukwu, MD       enoxaparin (LOVENOX) injection 30 mg  30 mg Subcutaneous Q24H WLaqueta CarinaB, RPH       feeding supplement (ENSURE ENLIVE / ENSURE PLUS) liquid 237 mL  237 mL Oral TID BM Agbata, Tochukwu, MD       [START ON 84/49/6759]folic acid (FOLVITE) tablet 1 mg  1 mg Oral Daily Agbata, Tochukwu, MD       gabapentin (NEURONTIN) capsule 300 mg  300 mg Oral BID Agbata, Tochukwu, MD       hydrocortisone (CORTEF) tablet 20 mg  20 mg Oral QPM Agbata, Tochukwu, MD       [START ON 12/16/2020] hydrocortisone (CORTEF) tablet 30 mg  30 mg Oral Daily Agbata, Tochukwu, MD       lactated ringers infusion   Intravenous Continuous Agbata, Tochukwu, MD       multivitamin with minerals tablet 1 tablet  1 tablet Oral Daily Agbata, Tochukwu, MD       naloxegol oxalate (MOVANTIK) tablet 25 mg  25 mg Oral Daily Agbata, Tochukwu, MD       ondansetron (ZOFRAN) tablet 4 mg  4 mg Oral Q6H PRN ACollier Bullock MD  Or   ondansetron (ZOFRAN) injection 4 mg  4 mg Intravenous Q6H PRN Agbata, Tochukwu, MD       pantoprazole (PROTONIX) EC tablet 40 mg  40 mg Oral QHS Agbata, Tochukwu, MD       Current Outpatient Medications  Medication Sig Dispense Refill   dexamethasone (DECADRON) 2 MG tablet Take 1 tablet by mouth 2 (two) times daily.     hydrocortisone (CORTEF) 10 MG tablet Take 3 tablets by mouth daily.     [START ON 01/07/2021] Oxycodone HCl 10 MG TABS Take 1 tablet (10 mg total) by mouth every 4 (four) hours as needed. Must last 30 days 180 tablet 0   AMBULATORY NON FORMULARY MEDICATION Medication Name: Intrathecal pump Bupivacaine 20.0 mg/ml Fentanyl 2,000.0 mcg/ml Dose 1846.4 mcg/day     Ensure (ENSURE)  Take 237 mLs by mouth 3 (three) times daily between meals.      folic acid (FOLVITE) 1 MG tablet Take 1 tablet (1 mg total) by mouth daily. 90 tablet 1   gabapentin (NEURONTIN) 300 MG capsule Take 3 capsules (900 mg total) by mouth at bedtime AND 1 capsule (300 mg total) 2 (two) times daily. 150 capsule 2   hydrocortisone (CORTEF) 10 MG tablet Take 3 tablets (30 mg total) by mouth daily. 30 tablet 0   hydrocortisone (CORTEF) 20 MG tablet Take 1 tablet (20 mg total) by mouth every evening. 30 tablet 0   metFORMIN (GLUCOPHAGE) 1000 MG tablet Take 1 tablet (1,000 mg total) by mouth daily with breakfast. 30 tablet 3   Multiple Vitamin (MULTIVITAMIN WITH MINERALS) TABS tablet Take 1 tablet by mouth daily. 30 tablet 0   naloxegol oxalate (MOVANTIK) 25 MG TABS tablet Take 1 tablet (25 mg total) by mouth daily. 30 tablet 2   naloxone (NARCAN) 2 MG/2ML injection Inject 1 mL (1 mg total) into the muscle as needed for up to 2 doses (for opioid overdose). In case of emergency (overdose), inject into muscle of upper arm or leg and call 911. 2 mL 0   pantoprazole (PROTONIX) 40 MG tablet Take 40 mg by mouth at bedtime.      VITAL SIGNS: BP 114/78   Pulse 96   Temp 98.1 F (36.7 C) (Oral)   Resp 17   Ht 5' 8"  (1.727 m)   Wt 110 lb 3.7 oz (50 kg)   SpO2 100%   BMI 16.76 kg/m  Filed Weights   12/15/20 1450  Weight: 110 lb 3.7 oz (50 kg)    Estimated body mass index is 16.76 kg/m as calculated from the following:   Height as of this encounter: 5' 8"  (1.727 m).   Weight as of this encounter: 110 lb 3.7 oz (50 kg).  LABS: CBC:    Component Value Date/Time   WBC 17.9 (H) 12/15/2020 1019   HGB 10.1 (L) 12/15/2020 1019   HGB 11.4 (L) 08/09/2014 1122   HCT 32.1 (L) 12/15/2020 1019   HCT 35.6 (L) 08/09/2014 1122   PLT 211 12/15/2020 1019   PLT 95 (L) 08/09/2014 1122   MCV 106.3 (H) 12/15/2020 1019   MCV 100 08/09/2014 1122   NEUTROABS 7.3 12/02/2020 0503   NEUTROABS 4.3 08/09/2014 1122    LYMPHSABS 5.1 (H) 12/02/2020 0503   LYMPHSABS 6.5 (H) 08/09/2014 1122   MONOABS 0.3 12/02/2020 0503   MONOABS 1.1 (H) 08/09/2014 1122   EOSABS 0.0 12/02/2020 0503   EOSABS 0.1 08/09/2014 1122   BASOSABS 0.0 12/02/2020 0503   BASOSABS 0.1 08/09/2014  1122   Comprehensive Metabolic Panel:    Component Value Date/Time   NA 140 12/15/2020 1019   NA 142 05/03/2014 1139   K 4.4 12/15/2020 1019   K 4.7 05/03/2014 1139   CL 100 12/15/2020 1019   CL 110 (H) 05/03/2014 1139   CO2 22 12/15/2020 1019   CO2 24 05/03/2014 1139   BUN 28 (H) 12/15/2020 1019   BUN 20 (H) 05/03/2014 1139   CREATININE 1.74 (H) 12/15/2020 1019   CREATININE 1.22 08/09/2014 1122   GLUCOSE 91 12/15/2020 1019   GLUCOSE 98 05/03/2014 1139   CALCIUM 8.8 (L) 12/15/2020 1019   CALCIUM 8.6 05/03/2014 1139   AST 21 12/15/2020 1019   AST 15 05/03/2014 1139   ALT 24 12/15/2020 1019   ALT 26 05/03/2014 1139   ALKPHOS 101 12/15/2020 1019   ALKPHOS 94 05/03/2014 1139   BILITOT 1.8 (H) 12/15/2020 1019   BILITOT 0.5 05/03/2014 1139   PROT 6.7 12/15/2020 1019   PROT 7.5 05/03/2014 1139   ALBUMIN 3.9 12/15/2020 1019   ALBUMIN 4.1 05/03/2014 1139    RADIOGRAPHIC STUDIES: CT ABDOMEN PELVIS W CONTRAST  Result Date: 11/30/2020 CLINICAL DATA:  Epigastric pain for 5 days.  Generalized weakness EXAM: CT ABDOMEN AND PELVIS WITH CONTRAST TECHNIQUE: Multidetector CT imaging of the abdomen and pelvis was performed using the standard protocol following bolus administration of intravenous contrast. CONTRAST:  49m OMNIPAQUE IOHEXOL 350 MG/ML SOLN COMPARISON:  11/21/2020 FINDINGS: Lower chest: New mixed density airspace opacity within the left lower lobe. Right lung bases clear. Heart size is normal. Hepatobiliary: Status post cholecystectomy. Similar degree of intrahepatic biliary dilatation. No focal liver lesion is identified. Pancreas: Unremarkable. No pancreatic ductal dilatation or surrounding inflammatory changes. Spleen: Spleen is  upper limits of normal in size without focal lesion. Adrenals/Urinary Tract: Unremarkable adrenal glands. Stable bilateral renal cysts. Two tiny right-sided renal stones without hydronephrosis. No left-sided renal stone or hydronephrosis. Urinary bladder is mildly distended but appears otherwise unremarkable. Stomach/Bowel: Stomach is within normal limits. Appendix appears normal. No evidence of bowel wall thickening, distention, or inflammatory changes. Vascular/Lymphatic: Advanced atherosclerosis throughout the aortoiliac axis with bi-iliac stents. No aneurysm. Similar degree of retroperitoneal lymphadenopathy. Reference left para-aortic nodal mass measuring approximately 4.3 x 2.4 cm (series 3, image 36), unchanged. Reproductive: Prostatomegaly. Other: No free fluid. No abdominopelvic fluid collection. No pneumoperitoneum. No abdominal wall hernia. Musculoskeletal: No acute or significant osseous findings. Neurostimulator is again seen entering at the L3-4 level and extending into the thoracic region, beyond the field of view. IMPRESSION: 1. New airspace opacity within the left lower lobe compatible with pneumonia. 2. Stable retroperitoneal lymphadenopathy. 3. Nonobstructing right nephrolithiasis. 4. Prostatomegaly. 5. Aortic atherosclerosis (ICD10-I70.0). Electronically Signed   By: NDavina PokeD.O.   On: 11/30/2020 15:31   CT ABDOMEN PELVIS W CONTRAST  Result Date: 11/21/2020 CLINICAL DATA:  Acute nonlocalized epigastric pain. Weakness and lethargy. Question perforation. Being treated for leukemia. EXAM: CT ABDOMEN AND PELVIS WITH CONTRAST TECHNIQUE: Multidetector CT imaging of the abdomen and pelvis was performed using the standard protocol following bolus administration of intravenous contrast. CONTRAST:  543mOMNIPAQUE IOHEXOL 350 MG/ML SOLN COMPARISON:  01/24/2020 FINDINGS: Lower chest: Lung bases are clear.  No pleural or pericardial fluid. Hepatobiliary: Previous cholecystectomy. No significant  liver parenchymal finding. Mild ductal prominence, within normal limits following cholecystectomy. Pancreas: Normal Spleen: Spleen upper limits of normal in size.  No focal lesion. Adrenals/Urinary Tract: Adrenal glands are normal. There are bilateral renal cysts. No evidence  of mass or hydronephrosis. One or 2 punctate nonobstructing stones in the lower pole of the right kidney. Stomach/Bowel: Stomach is normal. Small bowel is normal. Moderate amount of gas and fecal matter in the colon without evidence of inflammatory disease or frank obstruction. Normal appearing appendix. Vascular/Lymphatic: Aortic atherosclerosis. No aneurysm. IVC is normal. Retroperitoneal adenopathy is similar to the previous study, the largest nodal mass on the left at the level of the kidney measuring 4.2 x 2.8 cm. Other retroperitoneal nodes similarly stable. Reproductive: Enlarged prostate. Other: No free fluid or air. Musculoskeletal: No fracture or focal bone lesion. Neurostimulator in place, entering at the L3-4 level and extending into the thoracic region. IMPRESSION: No evidence of acute bowel pathology. No evidence of perforation as questioned clinically. Moderate amount of gas and fecal matter in the colon, possibly due to opioid use. Spleen upper limits of normal in size. Retroperitoneal lymphadenopathy, similar to the study of September 2021. Enlarged prostate. Aortic Atherosclerosis (ICD10-I70.0). Electronically Signed   By: Nelson Chimes M.D.   On: 11/21/2020 15:26   DG Chest Port 1 View  Result Date: 12/15/2020 CLINICAL DATA:  Follow-up pneumonia.  Syncope.  History of leukemia EXAM: PORTABLE CHEST 1 VIEW COMPARISON:  11/30/2020 FINDINGS: Normal heart size. Atherosclerotic calcification of the aortic knob. Interval resolution of previously seen left lower lobe pneumonia. No focal airspace consolidation, pleural effusion, or pneumothorax. IMPRESSION: No acute cardiopulmonary abnormality. Electronically Signed   By: Davina Poke D.O.   On: 12/15/2020 11:24   DG Chest Port 1 View  Result Date: 11/30/2020 CLINICAL DATA:  Questionable sepsis. EXAM: PORTABLE CHEST 1 VIEW COMPARISON:  Chest x-ray 11/21/2020, CT chest 01/24/2020 FINDINGS: The heart size and mediastinal contours are unchanged. Aortic calcification. Interval development of patchy airspace opacity of the left lower lobe. No pulmonary edema. No pleural effusion. No pneumothorax. No acute osseous abnormality. Degenerative changes of bilateral acromioclavicular joints. IMPRESSION: Left lower lobe pneumonia/aspiration pneumonia Followup PA and lateral chest X-ray is recommended in 3-4 weeks following therapy to ensure resolution and exclude underlying malignancy. Electronically Signed   By: Iven Finn M.D.   On: 11/30/2020 15:34   DG Chest Port 1 View  Result Date: 11/21/2020 CLINICAL DATA:  Questionable sepsis. Weakness. Lethargy. Being treated for leukemia. EXAM: PORTABLE CHEST 1 VIEW COMPARISON:  09/12/2019 FINDINGS: The heart size and mediastinal contours are within normal limits. Ordinary age related aortic atherosclerotic calcification. Both lungs are clear. The visualized skeletal structures are unremarkable. IMPRESSION: No active disease. Electronically Signed   By: Nelson Chimes M.D.   On: 11/21/2020 15:20    PERFORMANCE STATUS (ECOG) : 2 - Symptomatic, <50% confined to bed  Review of Systems Unless otherwise noted, a complete review of systems is negative.  Physical Exam General: NAD Cardiovascular: regular rate and rhythm Pulmonary: Unlabored Extremities: no edema, no joint deformities Skin: no rashes Neurological: Weakness but otherwise nonfocal  IMPRESSION: Patient is well-known to me from the clinic.  He was seen in the ER.  Patient admits to doing poorly.  This is his third hospitalization in the past month with each resulting in further decline.  Patient has continued to live alone but has limited support.  His son has been encouraging  patient to move in with him at his house in [place].  Patient has previously been reluctant to do so but now says that he thinks that he will likely decide to move to [place].  Unfortunately, patient is likely limited in regards to cancer treatment at this  point due to poor performance status and intolerability.  We discussed the option of supportive care and hospice and patient verbalized agreement.   I also called patient's son who also verbalized understanding that his father is doing poorly.  Son would be interested in patient moving in with him.  Also discussed the option of hospice care even if patient moves to [place].  Son seemed receptive of the idea of getting hospice involved.  Patient is pending evaluation by Dr. Rogue Bussing who also plans to speak with patient's son.  PLAN: -Continue current scope of treatment -Consider hospice involvement at time of discharge.  Hospice can coordinate transferring services to [place] if patient ultimately moves in with his son -DNR/DNI  Case and plan discussed with Dr. Rogue Bussing  Time Total: 60 minutes  Visit consisted of counseling and education dealing with the complex and emotionally intense issues of symptom management and palliative care in the setting of serious and potentially life-threatening illness.Greater than 50%  of this time was spent counseling and coordinating care related to the above assessment and plan.  Signed by: Altha Harm, PhD, NP-C

## 2020-12-15 NOTE — Progress Notes (Signed)
   12/15/20 1802  Vitals  Temp 98 F (36.7 C)  Temp Source Oral  BP 136/79  MAP (mmHg) 96  BP Location Left Arm  BP Method Automatic  Patient Position (if appropriate) Lying  Pulse Rate 95  Pulse Rate Source Dinamap  ECG Heart Rate 95  Resp 15  Oxygen Therapy  SpO2 100 %  O2 Device Room Air  Pain Assessment  Pain Scale 0-10  Pain Score 0  MEWS Score  MEWS Temp 0  MEWS Systolic 0  MEWS Pulse 0  MEWS RR 0  MEWS LOC 0  MEWS Score 0  MEWS Score Color Green   Admitted to 1C from ED, settled into bed with no complaints.

## 2020-12-15 NOTE — H&P (Signed)
History and Physical    James [name] WPY:[id number] DOB: 18-Jan-1951 DOA: 12/15/2020  PCP: [name], MD   Patient coming from: Home  I have personally briefly reviewed patient's old medical records in [place]  Chief Complaint: Weakness  HPI: James [name] is a 70 y.o. male with medical history significant for CLL on chemotherapy, opioid-induced constipation, diabetes mellitus, depression, GERD who was recently discharged from the hospital on December 05, 2020 following admission for sepsis from community-acquired pneumonia.   He presents to the ER via EMS for evaluation of weakness and abdominal pain and even though the triage notes that he had a syncopal episode, patient denies this. He states that he has been unable to ambulate and is very weak.  He complains of feeling dizzy and lightheaded but has not had any falls.  His oral intake is poor. He has abdominal pain mostly in the epigastrium which is chronic but denies having any changes in his bowel habits, no leg swelling, no shortness of breath, no urinary symptoms, no palpitations or diaphoresis. He denies having any difficulty or painful swallowing. Labs show sodium 140, potassium 4.4, chloride 100, bicarb 22, glucose 91, BUN 28, creatinine 1.74 above a baseline of 0.83, calcium 8.8, alkaline phosphatase 101, albumin 3.9, lipase 22, AST 21, ALT 24, total protein 6.7, lactic acid 3.0 >> 1.3, white count 17.9, hemoglobin 10.1, hematocrit 32.1, MCV 106, RDW 20.1, platelet count 211 Urinalysis is sterile Chest x-ray shows no evidence of acute cardiopulmonary disease Twelve-lead EKG reviewed by me shows sinus tachycardia with minimal ST depression in the lateral leads.    ED Course: Patient is a 70 year old male who presents to the emergency room for evaluation of weakness and difficulty ambulating.  He denies having any falls or loss of consciousness.  Patient lives alone and has increasing difficulty taking care of  himself. Labs reveal worsening of his renal function from a baseline serum creatinine of 0.83-1.74. He also had elevated lactic acid level shows a downward trend. He will be referred to observation status for further evaluation.   Review of Systems: As per HPI otherwise all other systems reviewed and negative.    Past Medical History:  Diagnosis Date   CLL (chronic lymphocytic leukemia) (HCC)    Constipation    Depression    Diabetes mellitus without complication (HCC)    GERD (gastroesophageal reflux disease)    Hematuria    Hyperlipidemia    Hypertension    Therapeutic opioid induced constipation     Past Surgical History:  Procedure Laterality Date   CATARACT EXTRACTION W/ INTRAOCULAR LENS IMPLANT Bilateral    CHOLECYSTECTOMY  1983   COLONOSCOPY WITH PROPOFOL N/A 10/27/2017   Procedure: COLONOSCOPY WITH PROPOFOL;  Surgeon: Manya Silvas, MD;  Location: [facility] ENDOSCOPY;  Service: Endoscopy;  Laterality: N/A;   ESOPHAGOGASTRODUODENOSCOPY (EGD) WITH PROPOFOL N/A 11/20/2016   Procedure: ESOPHAGOGASTRODUODENOSCOPY (EGD) WITH PROPOFOL;  Surgeon: Manya Silvas, MD;  Location: [facility] ENDOSCOPY;  Service: Endoscopy;  Laterality: N/A;   ESOPHAGOGASTRODUODENOSCOPY (EGD) WITH PROPOFOL N/A 10/27/2017   Procedure: ESOPHAGOGASTRODUODENOSCOPY (EGD) WITH PROPOFOL;  Surgeon: Manya Silvas, MD;  Location[facility] ENDOSCOPY;  Service: Endoscopy;  Laterality: N/A;   ESOPHAGOGASTRODUODENOSCOPY (EGD) WITH PROPOFOL N/A 11/23/2020   Procedure: ESOPHAGOGASTRODUODENOSCOPY (EGD) WITH PROPOFOL;  Surgeon: Jonathon Bellows, MD;  Location: [facility] ENDOSCOPY;  Service: Gastroenterology;  Laterality: N/A;   INTRATHECAL PUMP IMPLANT Left 02/07/2020   Procedure: INTRATHECAL PUMP & CATHETER IMPLANT;  Surgeon: Deetta Perla, MD;  Location: [facility] ORS;  Service: Neurosurgery;  Laterality: Left;   LOWER EXTREMITY ANGIOGRAPHY Right 05/19/2017   Procedure: LOWER EXTREMITY ANGIOGRAPHY;  Surgeon: Algernon Huxley, MD;  Location: [facility] CV LAB;  Service: Cardiovascular;  Laterality: Right;   LOWER EXTREMITY ANGIOGRAPHY Left 08/10/2018   Procedure: LOWER EXTREMITY ANGIOGRAPHY;  Surgeon: Algernon Huxley, MD;  Location: [place] CV LAB;  Service: Cardiovascular;  Laterality: Left;     reports that he has been smoking cigarettes. He has a 23.50 pack-year smoking history. He has never used smokeless tobacco. He reports current alcohol use of about 1.0 standard drink per week. He reports that he does not use drugs.  No Known Allergies  Family History  Problem Relation Age of Onset   Hypertension Sister       Prior to Admission medications   Medication Sig Start Date End Date Taking? Authorizing Provider  dexamethasone (DECADRON) 2 MG tablet Take 1 tablet by mouth 2 (two) times daily. 11/17/20  Yes [provider]  hydrocortisone (CORTEF) 10 MG tablet Take 3 tablets by mouth daily. 11/25/20  Yes [provider]  AMBULATORY NON FORMULARY MEDICATION Medication Name: Intrathecal pump Bupivacaine 20.0 mg/ml Fentanyl 2,000.0 mcg/ml Dose 1846.4 mcg/day    [provider]  Ensure (ENSURE) Take 237 mLs by mouth 3 (three) times daily between meals.     [provider]  folic acid (FOLVITE) 1 MG tablet Take 1 tablet (1 mg total) by mouth daily. 11/09/19   Cammie Sickle, MD  gabapentin (NEURONTIN) 300 MG capsule Take 3 capsules (900 mg total) by mouth at bedtime AND 1 capsule (300 mg total) 2 (two) times daily. 07/31/20 11/30/20  Milinda Pointer, MD  hydrocortisone (CORTEF) 10 MG tablet Take 3 tablets (30 mg total) by mouth daily. 11/25/20   [name], MD  hydrocortisone (CORTEF) 20 MG tablet Take 1 tablet (20 mg total) by mouth every evening. 11/25/20   [name], MD  metFORMIN (GLUCOPHAGE) 1000 MG tablet Take 1 tablet (1,000 mg total) by mouth daily with breakfast. 10/04/20   Jacquelin Hawking, NP  Multiple Vitamin (MULTIVITAMIN WITH MINERALS) TABS tablet Take 1 tablet by mouth daily.  11/25/20   [name], MD  naloxegol oxalate (MOVANTIK) 25 MG TABS tablet Take 1 tablet (25 mg total) by mouth daily. 09/28/20 12/27/20  Milinda Pointer, MD  naloxone [facility]) 2 MG/2ML injection Inject 1 mL (1 mg total) into the muscle as needed for up to 2 doses (for opioid overdose). In case of emergency (overdose), inject into muscle of upper arm or leg and call 911. 03/02/20   Milinda Pointer, MD  Oxycodone HCl 10 MG TABS Take 1 tablet (10 mg total) by mouth every 4 (four) hours as needed. Must last 30 days 01/07/21 02/06/21  Milinda Pointer, MD  pantoprazole (PROTONIX) 40 MG tablet Take 40 mg by mouth at bedtime. 12/24/19   [provider]    Physical Exam: Vitals:   12/15/20 0949 12/15/20 1141 12/15/20 1300  BP: 115/72 106/72 114/62  Pulse: (!) 110 (!) 103 98  Resp: 16 12   Temp: 98.1 F (36.7 C)    TempSrc: Oral    SpO2: 100% 100% 100%     Vitals:   12/15/20 0949 12/15/20 1141 12/15/20 1300  BP: 115/72 106/72 114/62  Pulse: (!) 110 (!) 103 98  Resp: 16 12   Temp: 98.1 F (36.7 C)    TempSrc: Oral    SpO2: 100% 100% 100%      Constitutional: Alert  and oriented x 3 . Not in any apparent distress.  Chronically ill-appearing HEENT:      Head: Normocephalic and atraumatic.         Eyes: PERLA, EOMI, Conjunctivae pallor. Sclera is non-icteric.       Mouth/Throat: Mucous membranes are dry       Neck: Supple with no signs of meningismus. Cardiovascular: Tachycardic. No murmurs, gallops, or rubs. 2+ symmetrical distal pulses are present . No JVD. No LE edema Respiratory: Respiratory effort normal .Lungs sounds clear bilaterally. No wheezes, crackles, or rhonchi.  Gastrointestinal: Soft, epigastric tenderness, and non distended with positive bowel sounds.  Genitourinary: No CVA tenderness. Musculoskeletal: Nontender with normal range of motion in all extremities. No cyanosis, or erythema of extremities. Neurologic:  Face is symmetric. Moving all extremities. No  gross focal neurologic deficits .  Generalized weakness Skin: Skin is warm, dry.  Ecchymosis on forearms Psychiatric: Mood and affect are normal    Labs on Admission: I have personally reviewed following labs and imaging studies  CBC: Recent Labs  Lab 12/15/20 1019  WBC 17.9*  HGB 10.1*  HCT 32.1*  MCV 106.3*  PLT 562   Basic Metabolic Panel: Recent Labs  Lab 12/15/20 1019  NA 140  K 4.4  CL 100  CO2 22  GLUCOSE 91  BUN 28*  CREATININE 1.74*  CALCIUM 8.8*   GFR: CrCl cannot be calculated (Unknown ideal weight.). Liver Function Tests: Recent Labs  Lab 12/15/20 1019  AST 21  ALT 24  ALKPHOS 101  BILITOT 1.8*  PROT 6.7  ALBUMIN 3.9   Recent Labs  Lab 12/15/20 1019  LIPASE 22   No results for input(s): AMMONIA in the last 168 hours. Coagulation Profile: No results for input(s): INR, PROTIME in the last 168 hours. Cardiac Enzymes: No results for input(s): CKTOTAL, CKMB, CKMBINDEX, TROPONINI in the last 168 hours. BNP (last 3 results) No results for input(s): PROBNP in the last 8760 hours. HbA1C: No results for input(s): HGBA1C in the last 72 hours. CBG: No results for input(s): GLUCAP in the last 168 hours. Lipid Profile: No results for input(s): CHOL, HDL, LDLCALC, TRIG, CHOLHDL, LDLDIRECT in the last 72 hours. Thyroid Function Tests: No results for input(s): TSH, T4TOTAL, FREET4, T3FREE, THYROIDAB in the last 72 hours. Anemia Panel: No results for input(s): VITAMINB12, FOLATE, FERRITIN, TIBC, IRON, RETICCTPCT in the last 72 hours. Urine analysis:    Component Value Date/Time   COLORURINE YELLOW (A) 12/15/2020 1019   APPEARANCEUR HAZY (A) 12/15/2020 1019   LABSPEC 1.018 12/15/2020 1019   PHURINE 6.0 12/15/2020 1019   GLUCOSEU NEGATIVE 12/15/2020 1019   HGBUR NEGATIVE 12/15/2020 1019   BILIRUBINUR NEGATIVE 12/15/2020 1019   KETONESUR 5 (A) 12/15/2020 1019   PROTEINUR NEGATIVE 12/15/2020 1019   NITRITE NEGATIVE 12/15/2020 1019   LEUKOCYTESUR  TRACE (A) 12/15/2020 1019    Radiological Exams on Admission: DG Chest Port 1 View  Result Date: 12/15/2020 CLINICAL DATA:  Follow-up pneumonia.  Syncope.  History of leukemia EXAM: PORTABLE CHEST 1 VIEW COMPARISON:  11/30/2020 FINDINGS: Normal heart size. Atherosclerotic calcification of the aortic knob. Interval resolution of previously seen left lower lobe pneumonia. No focal airspace consolidation, pleural effusion, or pneumothorax. IMPRESSION: No acute cardiopulmonary abnormality. Electronically Signed   By: Davina Poke D.O.   On: 12/15/2020 11:24     Assessment/Plan Principal Problem:   Syncope and collapse Active Problems:   CLL (chronic lymphocytic leukemia) (HCC)   Diabetes mellitus type 2, uncomplicated ([place])  Hypertension   Anemia due to stage 3 chronic kidney disease (HCC)   AKI (acute kidney injury) ([place])   Abdominal wall pain in epigastric region   Adrenal insufficiency due to cancer therapy ([place])   Depression     Acute kidney injury Most likely secondary to poor oral intake At baseline patient has a serum creatinine of 0.8 reported on admission it is 1.7 Hydrate patient and repeat renal parameters in a.m. Hold metformin         Nicotine dependence Smoking cessation has been discussed with patient in detail We will place patient on a nicotine transdermal patch 14 mg daily         Adrenal insufficiency Continue scheduled hydrocortisone       Diabetes mellitus with complications of stage III chronic kidney disease Place patient on consistent carbohydrate diet Blood sugar checks with meals.  No insulin coverage for now since patient is euglycemic and is at high risk for hypoglycemia.      Chronic pain syndrome Patient has an intrathecal bupivacaine pump which we will continue during this admission Continue gabapentin        Lactic acidosis Secondary to metformin use No evidence of an acute infectious process Lactic acid level is currently  within normal limits     Anemia of chronic kidney disease H&H is stable      Physical deconditioning/multiple readmissions Patient with a history of CLL who has had 3 hospitalizations in the last 1 month and has shown a progressive decline Will request palliative care consult    DVT prophylaxis: Lovenox  Code Status: DO NOT RESUSCITATE Family Communication: Greater than 50% of time was spent discussing patient's condition and plan of care with him at the bedside.  All questions and concerns have been addressed.  He lists his son as his healthcare power of attorney.  CODE STATUS was discussed and he is a DNR. Disposition Plan: Back to previous home environment Consults called: Palliative care consult Status: Observation    Gyanna Jarema MD [facility]     12/15/2020, 1:23 PM

## 2020-12-15 NOTE — ED Notes (Signed)
Attempted to assist pt to bedside toilet, pt became unsteady on his feet and started to fall. This RN grabbed pt under both arms and then staff x3 assisted pt back to bed - pt did not hit the floor, appears comfortable in ED stretcher with regular and unlabored respirations.   Unable to obtain orthostatic vitals for this reason at present time.  Stretcher locked in low position with side rails up x2, call light in reach. High fall precautions in place including use of bed alarm. Continuous cardiac and pulse ox monitoring resumed.

## 2020-12-15 NOTE — ED Notes (Signed)
Lab called to obtain repeat lactate due to pt being difficult stick. State they will send someone.

## 2020-12-15 NOTE — Plan of Care (Signed)
Palliative: Thank you for this consult. Unfortunately due to high volume of consults there will be a delay in a Palliative Provider seeing this patient. Palliative Medicine will return to service on 8/22 and will see patient at that time.   No charge Quinn Axe, NP Palliative Medicine Please call Palliative Medicine team phone with any questions [phone]. For individual providers please see AMION

## 2020-12-15 NOTE — ED Notes (Signed)
Informed RN bed assigned 

## 2020-12-15 NOTE — Assessment & Plan Note (Deleted)
#  CLL/SLL- relapsed most recently s/p Gazyva x6 cycles [AUG 2021] 15th MARCH 2022- No evidence of active lymphoma by FDG PET scan; Stable mild mediastinal adenopathy and moderate periaortic retroperitoneal adenopathy. No significant metabolic activity in these nodes; HOWEVER BONE MARROW BIOPSY- [April 2022]- RECURRENT/RELPASED CLL;CLL FISH- 11 q/17p DELETION.   # STOPPED  IBRUTINIB 420 mg/day - tolerating poorly worsening anemia [see below].  # Severe anemia likely secondary to CLL-/CKD -hemoglobin 8.4; HOLD blood transfusion today.  #Chronic abdominal pain-unclear etiology- WORSE- currently being titrated on pain pump as per pain management.  Question adrenal insufficiency/given intermittent hyperkalemia/and also improvement on IV dexamethasone-proceed with IV fluids/IV Dex 10 mg today.  We will plan dexamethasone 2 mg p.o. twice daily.  Again reevaluate in 1 week; check morning cortisol; ACTH baseline.   # weight loss/depression;-worse; continue  remeron 30 mg qhs. See above.   #Hyperkalemia-5.1; STABLE today- /CKD-III  [baseline 1.4; Dr.Lateef]; STABLE; continue LoKelma as per nephrology.   # DISPOSITION:  # TODAY-2 lits if IVFs; Dex 10 mg IVP  # NP- 1 week- possible IVFs x1 hours; dex 10 mg # follow up in 2 weeks- MD;lab- cbc/CMP;LDH-/hold tube- 1unit PRBC -Dr.B  Addendum: I spoke to patient's son [name] in [place] the concern for insufficiency-and further plan to start the patient on steroids.  With regards to moving to [place] at this time-recommend holding off given patient's ongoing acute illness/and the difficulty with the transfer of care at this time.  Patient wants to stay locally-which is quite reasonable given the patient's ongoing illness.  [name] will inform his father regarding steroid prescription/directions etc. # 40 minutes face-to-face with the patient discussing the above plan of care; more than 50% of time spent on prognosis/ natural history; counseling and  coordination.

## 2020-12-15 NOTE — ED Provider Notes (Signed)
[facility] Emergency Department Provider Note   ____________________________________________    I have reviewed the triage vital signs and the nursing notes.   HISTORY  Chief Complaint Loss of Consciousness     HPI James [name] is a 70 y.o. male with history of CLL, diabetes, chronic abdominal pain, possible adrenal insufficiency who presents with complaints of abdominal pain, generalized weakness.  Patient was recently discharged from the hospital on August 9, for treatment for sepsis/pneumonia.  Patient reports that he had been doing somewhat better but reports his weakness has increased significantly, he continues to have chronic abdominal pain.  He does that he has a pain pump but it is not helping him.  He has had multiple CT scans to evaluate his chronic abdominal pain.  Past Medical History:  Diagnosis Date   CLL (chronic lymphocytic leukemia) ([place])    Constipation    Depression    Diabetes mellitus without complication (HCC)    GERD (gastroesophageal reflux disease)    Hematuria    Hyperlipidemia    Hypertension    Therapeutic opioid induced constipation     Patient Active Problem List   Diagnosis Date Noted   Lobar pneumonia ([place])    Sepsis ([place]) 11/30/2020   Depression    Pneumonia due to infectious organism    Adrenal insufficiency due to cancer therapy ([place])    Protein-calorie malnutrition, severe 11/23/2020   Anemia of chronic disease 11/21/2020   Hypokalemia 11/21/2020   Therapeutic opioid-induced constipation (OIC) 09/28/2020   Non-traumatic compression fracture of T2 thoracic vertebra, sequela 04/17/2020   Non-traumatic compression fracture of T1 thoracic vertebra, sequela 04/17/2020   Non-traumatic compression fracture of T3 thoracic vertebra, sequela 04/17/2020   Chronic neuropathic pain 04/11/2020   Non compliance w medication regimen 03/09/2020   History of high risk medication treatment 03/02/2020   Medication  care plan discussed with patient 03/02/2020   Pain medication agreement discussed 03/02/2020   Pain medication agreement signed 03/02/2020   Opiate use 03/02/2020   Physical tolerance to opiate drug 03/02/2020   Encounter for adjustment and management of infusion pump 02/29/2020   Presence of implanted infusion pump (Medtronic) 02/15/2020   Presence of intrathecal pump (Medtronic) 02/15/2020   DDD (degenerative disc disease), thoracic 07/12/2019   DDD (degenerative disc disease), lumbosacral 07/12/2019   Lumbar facet hypertrophy ([place]) 07/12/2019   Cancer-related pain 07/12/2019   Elevated sed rate 07/12/2019   Abdominal wall pain in epigastric region 06/15/2019   Thoracic radiculitis 06/15/2019   Right-sided low back pain without sciatica 06/15/2019   Bradycardia, unspecified 06/01/2019   Hypotension 06/01/2019   Chronic anticoagulation (Plavix) 05/19/2019   Vitamin D deficiency 05/19/2019   Chronic pain syndrome 05/03/2019   Pharmacologic therapy 05/03/2019   Disorder of skeletal system 05/03/2019   Problems influencing health status 05/03/2019   Chronic abdominal pain 05/03/2019   Elevated uric acid in blood 05/03/2019   Chronic prescription opiate use 05/03/2019   Enlarged prostate 24/12/7351   Folic acid deficiency 29/92/4268   Major depressive disorder, recurrent, mild ([place]) 03/08/2019   Shortness of breath 12/28/2018   Acute on chronic renal failure ([place]) 12/28/2018   SOB (shortness of breath) 12/28/2018   Portal hypertensive gastropathy ([place]) 09/25/2018   CKD stage 3 secondary to diabetes ([place]) 08/04/2018   Atherosclerosis of native arteries of the extremities with ulceration ([place]) 08/04/2018   Stage 3 chronic kidney disease ([place]) 08/04/2018   Anemia due to stage 3 chronic kidney disease ([place]) 04/15/2018  Gastroesophageal reflux disease without esophagitis 08/22/2017   Chronic epigastric pain (1ry area of Pain) 08/21/2017   Hx of adenomatous colonic polyps  08/21/2017   Atherosclerosis of native arteries of extremity with rest pain ([place]) 05/13/2017   Hyperlipidemia 05/06/2017   Atherosclerotic peripheral vascular disease with intermittent claudication ([place]) 05/06/2017   Toe cyanosis 05/06/2017   AVM (arteriovenous malformation) of duodenum, acquired with hemorrhage 12/12/2016   Thrombocytopenia ([place]) 12/12/2016   Symptomatic anemia 10/16/2016   Current smoker 10/16/2016   Malignant lymphoma, small lymphocytic ([place]) 03/25/2016   Generalized abdominal pain 12/15/2015   CLL (chronic lymphocytic leukemia) ([place]) 12/26/2013   Diabetes mellitus type 2, uncomplicated ([place]) 26/37/8588   Hypertension 12/26/2013    Past Surgical History:  Procedure Laterality Date   CATARACT EXTRACTION W/ INTRAOCULAR LENS IMPLANT Bilateral    CHOLECYSTECTOMY  1983   COLONOSCOPY WITH PROPOFOL N/A 10/27/2017   Procedure: COLONOSCOPY WITH PROPOFOL;  Surgeon: Manya Silvas, MD;  Location: [facility] ENDOSCOPY;  Service: Endoscopy;  Laterality: N/A;   ESOPHAGOGASTRODUODENOSCOPY (EGD) WITH PROPOFOL N/A 11/20/2016   Procedure: ESOPHAGOGASTRODUODENOSCOPY (EGD) WITH PROPOFOL;  Surgeon: Manya Silvas, MD;  Location: [facility] ENDOSCOPY;  Service: Endoscopy;  Laterality: N/A;   ESOPHAGOGASTRODUODENOSCOPY (EGD) WITH PROPOFOL N/A 10/27/2017   Procedure: ESOPHAGOGASTRODUODENOSCOPY (EGD) WITH PROPOFOL;  Surgeon: Manya Silvas, MD;  Location: [facility] ENDOSCOPY;  Service: Endoscopy;  Laterality: N/A;   ESOPHAGOGASTRODUODENOSCOPY (EGD) WITH PROPOFOL N/A 11/23/2020   Procedure: ESOPHAGOGASTRODUODENOSCOPY (EGD) WITH PROPOFOL;  Surgeon: Jonathon Bellows, MD;  Location: [facility] ENDOSCOPY;  Service: Gastroenterology;  Laterality: N/A;   INTRATHECAL PUMP IMPLANT Left 02/07/2020   Procedure: INTRATHECAL PUMP & CATHETER IMPLANT;  Surgeon: Deetta Perla, MD;  Location: [facility] ORS;  Service: Neurosurgery;  Laterality: Left;   LOWER EXTREMITY ANGIOGRAPHY Right 05/19/2017   Procedure: LOWER EXTREMITY ANGIOGRAPHY;   Surgeon: Algernon Huxley, MD;  Location: [place] CV LAB;  Service: Cardiovascular;  Laterality: Right;   LOWER EXTREMITY ANGIOGRAPHY Left 08/10/2018   Procedure: LOWER EXTREMITY ANGIOGRAPHY;  Surgeon: Algernon Huxley, MD;  Location: [place] CV LAB;  Service: Cardiovascular;  Laterality: Left;    Prior to Admission medications   Medication Sig Start Date End Date Taking? Authorizing Provider  dexamethasone (DECADRON) 2 MG tablet Take 1 tablet by mouth 2 (two) times daily. 11/17/20  Yes [provider]  hydrocortisone (CORTEF) 10 MG tablet Take 3 tablets by mouth daily. 11/25/20  Yes [provider]  AMBULATORY NON FORMULARY MEDICATION Medication Name: Intrathecal pump Bupivacaine 20.0 mg/ml Fentanyl 2,000.0 mcg/ml Dose 1846.4 mcg/day    [provider]  Ensure (ENSURE) Take 237 mLs by mouth 3 (three) times daily between meals.     [provider]  folic acid (FOLVITE) 1 MG tablet Take 1 tablet (1 mg total) by mouth daily. 11/09/19   Cammie Sickle, MD  gabapentin (NEURONTIN) 300 MG capsule Take 3 capsules (900 mg total) by mouth at bedtime AND 1 capsule (300 mg total) 2 (two) times daily. 07/31/20 11/30/20  Milinda Pointer, MD  hydrocortisone (CORTEF) 10 MG tablet Take 3 tablets (30 mg total) by mouth daily. 11/25/20   [name], MD  hydrocortisone (CORTEF) 20 MG tablet Take 1 tablet (20 mg total) by mouth every evening. 11/25/20   [name], MD  metFORMIN (GLUCOPHAGE) 1000 MG tablet Take 1 tablet (1,000 mg total) by mouth daily with breakfast. 10/04/20   Jacquelin Hawking, NP  Multiple Vitamin (MULTIVITAMIN WITH MINERALS) TABS tablet Take 1 tablet by mouth daily. 11/25/20   Posey Pronto,  Sona, MD  naloxegol oxalate (MOVANTIK) 25 MG TABS tablet Take 1 tablet (25 mg total) by mouth daily. 09/28/20 12/27/20  Milinda Pointer, MD  naloxone [facility]) 2 MG/2ML injection Inject 1 mL (1 mg total) into the muscle as needed for up to 2 doses (for opioid overdose). In case  of emergency (overdose), inject into muscle of upper arm or leg and call 911. 03/02/20   Milinda Pointer, MD  Oxycodone HCl 10 MG TABS Take 1 tablet (10 mg total) by mouth every 4 (four) hours as needed. Must last 30 days 01/07/21 02/06/21  Milinda Pointer, MD  pantoprazole (PROTONIX) 40 MG tablet Take 40 mg by mouth at bedtime. 12/24/19   [provider]     Allergies Patient has no known allergies.  Family History  Problem Relation Age of Onset   Hypertension Sister     Social History Social History   Tobacco Use   Smoking status: Every Day    Packs/day: 0.50    Years: 47.00    Pack years: 23.50    Types: Cigarettes   Smokeless tobacco: Never  Vaping Use   Vaping Use: Never used  Substance Use Topics   Alcohol use: Yes    Alcohol/week: 1.0 standard drink    Types: 1 Glasses of wine per week    Comment:  almost none in last 6 months   Drug use: No    Review of Systems  Constitutional: No fever/chills Eyes: No visual changes.  ENT: No sore throat. Cardiovascular: Denies chest pain. Respiratory: Denies shortness of breath. Gastrointestinal: As above Genitourinary: Negative for dysuria. Musculoskeletal: Negative for back pain. Skin: Negative for rash. Neurological: Negative for headaches    ____________________________________________   PHYSICAL EXAM:  VITAL SIGNS: ED Triage Vitals  Enc Vitals Group     BP 12/15/20 0949 115/72     Pulse Rate 12/15/20 0949 (!) 110     Resp 12/15/20 0949 16     Temp 12/15/20 0949 98.1 F (36.7 C)     Temp Source 12/15/20 0949 Oral     SpO2 12/15/20 0949 100 %     Weight --      Height --      Head Circumference --      Peak Flow --      Pain Score 12/15/20 0940 9     Pain Loc --      Pain Edu? --      Excl. in [place]? --     Constitutional: Alert and oriented.  Eyes: Conjunctivae are normal.   Nose: No congestion/rhinnorhea. Mouth/Throat: Mucous membranes are moist.    Cardiovascular: Normal rate,  regular rhythm. Grossly normal heart sounds.  Good peripheral circulation. Respiratory: Normal respiratory effort.  No retractions. Lungs CTAB. Gastrointestinal: Soft and nontender. No distention.  No CVA tenderness. Musculoskeletal: Warm and well perfused Neurologic:  Normal speech and language. No gross focal neurologic deficits are appreciated.  Skin:  Skin is warm, dry and intact. No rash noted. Psychiatric: Mood and affect are normal. Speech and behavior are normal.  ____________________________________________   LABS (all labs ordered are listed, but only abnormal results are displayed)  Labs Reviewed  CBC - Abnormal; Notable for the following components:      Result Value   WBC 17.9 (*)    RBC 3.02 (*)    Hemoglobin 10.1 (*)    HCT 32.1 (*)    MCV 106.3 (*)    RDW 20.1 (*)    All other components  within normal limits  URINALYSIS, COMPLETE (UACMP) WITH MICROSCOPIC - Abnormal; Notable for the following components:   Color, Urine YELLOW (*)    APPearance HAZY (*)    Ketones, ur 5 (*)    Leukocytes,Ua TRACE (*)    All other components within normal limits  COMPREHENSIVE METABOLIC PANEL - Abnormal; Notable for the following components:   BUN 28 (*)    Creatinine, Ser 1.74 (*)    Calcium 8.8 (*)    Total Bilirubin 1.8 (*)    GFR, Estimated 41 (*)    Anion gap 18 (*)    All other components within normal limits  LACTIC ACID, PLASMA - Abnormal; Notable for the following components:   Lactic Acid, Venous 3.0 (*)    All other components within normal limits  CULTURE, BLOOD (SINGLE)  RESP PANEL BY RT-PCR (FLU A&B, COVID) [id number]  LACTIC ACID, PLASMA  LIPASE, BLOOD  CBG MONITORING, ED  TROPONIN I (HIGH SENSITIVITY)   ____________________________________________  EKG  ED ECG REPORT I, Lavonia Drafts, the attending physician, personally viewed and interpreted this ECG.  Date: 12/15/2020  Rhythm: Sinus tachycardia QRS Axis: normal Intervals: normal ST/T Wave  abnormalities: Nonspecific changes Narrative Interpretation: no evidence of acute ischemia  ____________________________________________  RADIOLOGY  Chest x-ray read by me, no evidence of pneumonia ____________________________________________   PROCEDURES  Procedure(s) performed: No  Procedures   Critical Care performed: No ____________________________________________   INITIAL IMPRESSION / ASSESSMENT AND PLAN / ED COURSE  Pertinent labs & imaging results that were available during my care of the patient were reviewed by me and considered in my medical decision making (see chart for details).   Patient presents with chronic abdominal pain, generalized fatigue and weakness.  Has had numerous imaging studies for his abdominal pain, did not see a benefit to repeating at this time  Work notable for elevated white blood cell count, could be related to steroid use/CLL, however lactic is also elevated  Patient is mildly dehydrated, will give IV fluids.  Discussed with Dr. Rogue Bussing who recommends blood cultures and admission given patient's multiple comorbidities and rapid decline  Discussed with Dr. Francine Graven of the hospitalist service for admission    ____________________________________________   FINAL CLINICAL IMPRESSION(S) / ED DIAGNOSES  Final diagnoses:  Weakness  CLL (chronic lymphocytic leukemia) ([place])  Elevated lactic acid level        Note:  This document was prepared using Dragon voice recognition software and may include unintentional dictation errors.    Lavonia Drafts, MD 12/15/20 1306

## 2020-12-15 NOTE — ED Notes (Signed)
Pt assisted with urinal. Pt back in bed, both side rails up

## 2020-12-15 NOTE — ED Notes (Signed)
To room due to bed alarm sounding, pt sitting on edge of bed with legs between rails attempting to use urinal. Assisted pt with use of urinal, changed bed linens, assisted back to bed. Stretcher locked in low position with side rails up x2, call light in reach, bed alarm in use.

## 2020-12-15 NOTE — Progress Notes (Deleted)
Should there cone [place] OFFICE PROGRESS NOTE  Patient Care Team: [name], MD as PCP - General (Internal Medicine) Cammie Sickle, MD as Consulting Physician (Hematology and Oncology) Borders, Kirt Boys, NP as Nurse Practitioner (Hospice and Palliative Medicine) Verlon Au, NP as Nurse Practitioner (Hematology and Oncology) Jacquelin Hawking, NP as Nurse Practitioner (Oncology) Lonell Face, NP as Nurse Practitioner (Neurosurgery) Milinda Pointer, MD as Consulting Physician (Pain Medicine)  Cancer Staging No matching staging information was found for the patient.   Oncology History Overview Note  # 2006- CLL STAGE IV; MAY 2011- WBC- 57K;Platelets-99;Hb-12/CT Bulky LN; BMBx- 80% Invol; del 11; START Benda-Ritux x4 [finished Sep 2011];   # July 2015-Progression; Sep 2015-START ibrutinib; CT scan DEC 2015- Improvement LN; Cont Ibrutinib 114m/d; NOV 2016 CT- 1-2CM LN [mild progression compared to Dec 2015];NOV 2016- FISH peripheral blood- NO MUTATIONS/CD-38 Positive; NOV 7th- CONT IBRUTINIB 2 pills/day; CT AUG 2017- STABLE;  DEC 6th PET- Mild RP LN/ Retrocrural LN  # OFF ibrutinib [? intol]- sep 2019- Jan 16th 2020; Re-start Ibrutinib; September 2020-stop ibrutinib [poor tolerance/worsening anemia]  # Jan 16th 2020- start aranesp; [place]   # MARCH 11th 2021- Gazyva x 6 finished AUG 2021.   #April May 2022-worsening anemia- BMBx- CLL; cytogenetics: 20 q del [incidental]; rFISH- 17p/11q DEL  # Early May 2022- START IBRUTINIB 420 mg/day  # SEP 2021- prostate enlargement on CT scan- UA-NEG; PSA- WNL; s/p Dr.Wolff.    # DEC 2019- PAIN CONTRACT  # PALLIATIVE CARE- 06/21/2019-  # October 2019-bone marrow biopsy [worsening anemia]-question dyserythropoietic changes/small clone of CLL;  # [facility] HEM- NEG.  SA skin infection [Oct 2016] s/p clinda -------------------------------------------------------    DIAGNOSIS:  CLL  STAGE:   IV      ;GOALS: palliative  CURRENT/MOST RECENT THERAPY : Ibrutinib    CLL (chronic lymphocytic leukemia) ([facility]  07/08/2019 -  Chemotherapy   The patient had obinutuzumab (GAZYVA) 100 mg in sodium chloride 0.9 % 100 mL (0.9615 mg/mL) chemo infusion, 100 mg, Intravenous, Once, 6 of 6 cycles Administration: 100 mg (07/08/2019), 900 mg (07/09/2019), 1,000 mg (07/26/2019), 1,000 mg (08/16/2019), 1,000 mg (08/02/2019), 1,000 mg (09/13/2019), 1,000 mg (10/11/2019), 1,000 mg (11/09/2019), 1,000 mg (12/07/2019)  for chemotherapy treatment.      INTERVAL HISTORY:  James [id number].  male pleasant patient above history of CLL and chronic abdominal pain of unclear etiology; severe anemia of unclear etiology is here for follow-up.  Patient continues to feel poorly.  He complains of worsening abdominal pain.  Losing weight.  Dizzy spells.  Positive for nausea but no vomiting.  Appetite is poor.  Of note patient feels improved for few days after he gets IV steroids and IV fluids in the clinic.  Again he is feeling poorly.   Review of Systems  Constitutional:  Positive for malaise/fatigue and weight loss. Negative for chills, diaphoresis and fever.  HENT:  Negative for nosebleeds and sore throat.   Eyes:  Negative for double vision.  Respiratory:  Positive for shortness of breath. Negative for cough, hemoptysis, sputum production and wheezing.   Cardiovascular:  Negative for chest pain, palpitations, orthopnea and leg swelling.  Gastrointestinal:  Positive for abdominal pain, constipation and nausea. Negative for blood in stool, diarrhea, heartburn, melena and vomiting.  Genitourinary:  Negative for dysuria, frequency and urgency.  Skin: Negative.  Negative for itching and rash.  Neurological:  Positive for dizziness and weakness. Negative for tingling, focal weakness  and headaches.  Endo/Heme/Allergies:  Does not bruise/bleed easily.  Psychiatric/Behavioral:  Negative for depression. The  patient is not nervous/anxious and does not have insomnia.      PAST MEDICAL HISTORY :  Past Medical History:  Diagnosis Date  . CLL (chronic lymphocytic leukemia) ([place])   . Constipation   . Depression   . Diabetes mellitus without complication ([place])   . GERD (gastroesophageal reflux disease)   . Hematuria   . Hyperlipidemia   . Hypertension   . Therapeutic opioid induced constipation     PAST SURGICAL HISTORY :   Past Surgical History:  Procedure Laterality Date  . CATARACT EXTRACTION W/ INTRAOCULAR LENS IMPLANT Bilateral   . CHOLECYSTECTOMY  1983  . COLONOSCOPY WITH PROPOFOL N/A 10/27/2017   Procedure: COLONOSCOPY WITH PROPOFOL;  Surgeon: Manya Silvas, MD;  Location: [facility] ENDOSCOPY;  Service: Endoscopy;  Laterality: N/A;  . ESOPHAGOGASTRODUODENOSCOPY (EGD) WITH PROPOFOL N/A 11/20/2016   Procedure: ESOPHAGOGASTRODUODENOSCOPY (EGD) WITH PROPOFOL;  Surgeon: Manya Silvas, MD;  Location: [facility] ENDOSCOPY;  Service: Endoscopy;  Laterality: N/A;  . ESOPHAGOGASTRODUODENOSCOPY (EGD) WITH PROPOFOL N/A 10/27/2017   Procedure: ESOPHAGOGASTRODUODENOSCOPY (EGD) WITH PROPOFOL;  Surgeon: Manya Silvas, MD;  Location[facility] ENDOSCOPY;  Service: Endoscopy;  Laterality: N/A;  . ESOPHAGOGASTRODUODENOSCOPY (EGD) WITH PROPOFOL N/A 11/23/2020   Procedure: ESOPHAGOGASTRODUODENOSCOPY (EGD) WITH PROPOFOL;  Surgeon: Jonathon Bellows, MD;  Location: [facility] ENDOSCOPY;  Service: Gastroenterology;  Laterality: N/A;  . INTRATHECAL PUMP IMPLANT Left 02/07/2020   Procedure: INTRATHECAL PUMP & CATHETER IMPLANT;  Surgeon: Deetta Perla, MD;  Location: [facility] ORS;  Service: Neurosurgery;  Laterality: Left;  . LOWER EXTREMITY ANGIOGRAPHY Right 05/19/2017   Procedure: LOWER EXTREMITY ANGIOGRAPHY;  Surgeon: Algernon Huxley, MD;  Location: [place] CV LAB;  Service: Cardiovascular;  Laterality: Right;  . LOWER EXTREMITY ANGIOGRAPHY Left 08/10/2018   Procedure: LOWER EXTREMITY ANGIOGRAPHY;  Surgeon: Algernon Huxley, MD;   Location: [place] CV LAB;  Service: Cardiovascular;  Laterality: Left;    FAMILY HISTORY :   Family History  Problem Relation Age of Onset  . Hypertension Sister     SOCIAL HISTORY:   Social History   Tobacco Use  . Smoking status: Every Day    Packs/day: 0.50    Years: 47.00    Pack years: 23.50    Types: Cigarettes  . Smokeless tobacco: Never  Vaping Use  . Vaping Use: Never used  Substance Use Topics  . Alcohol use: Yes    Alcohol/week: 1.0 standard drink    Types: 1 Glasses of wine per week    Comment:  almost none in last 6 months  . Drug use: No    ALLERGIES:  has No Known Allergies.  MEDICATIONS:  Current Outpatient Medications  Medication Sig Dispense Refill  . AMBULATORY NON FORMULARY MEDICATION Medication Name: Intrathecal pump Bupivacaine 20.0 mg/ml Fentanyl 2,000.0 mcg/ml Dose 1846.4 mcg/day    . Ensure (ENSURE) Take 237 mLs by mouth 3 (three) times daily between meals.     . folic acid (FOLVITE) 1 MG tablet Take 1 tablet (1 mg total) by mouth daily. 90 tablet 1  . gabapentin (NEURONTIN) 300 MG capsule Take 3 capsules (900 mg total) by mouth at bedtime AND 1 capsule (300 mg total) 2 (two) times daily. 150 capsule 2  . hydrocortisone (CORTEF) 10 MG tablet Take 3 tablets (30 mg total) by mouth daily. 30 tablet 0  . hydrocortisone (CORTEF) 20 MG tablet Take 1 tablet (20 mg total) by mouth every evening. [place]  tablet 0  . metFORMIN (GLUCOPHAGE) 1000 MG tablet Take 1 tablet (1,000 mg total) by mouth daily with breakfast. 30 tablet 3  . Multiple Vitamin (MULTIVITAMIN WITH MINERALS) TABS tablet Take 1 tablet by mouth daily. 30 tablet 0  . naloxegol oxalate (MOVANTIK) 25 MG TABS tablet Take 1 tablet (25 mg total) by mouth daily. 30 tablet 2  . naloxone (NARCAN) 2 MG/2ML injection Inject 1 mL (1 mg total) into the muscle as needed for up to 2 doses (for opioid overdose). In case of emergency (overdose), inject into muscle of upper arm or leg and call 911. 2 mL 0   . [START ON 01/07/2021] Oxycodone HCl 10 MG TABS Take 1 tablet (10 mg total) by mouth every 4 (four) hours as needed. Must last 30 days 180 tablet 0  . pantoprazole (PROTONIX) 40 MG tablet Take 40 mg by mouth at bedtime.     No current facility-administered medications for this visit.    PHYSICAL EXAMINATION: ECOG PERFORMANCE STATUS: 1 - Symptomatic but completely ambulatory  There were no vitals taken for this visit.  There were no vitals filed for this visit.   Physical Exam Constitutional:      Comments: He is accompanied by his James [name].  Appears weak.  Fetal position because of abdominal pain.  Appears pale.  HENT:     Head: Normocephalic and atraumatic.     Mouth/Throat:     Pharynx: No oropharyngeal exudate.  Eyes:     Pupils: Pupils are equal, round, and reactive to light.  Cardiovascular:     Rate and Rhythm: Normal rate and regular rhythm.  Pulmonary:     Effort: No respiratory distress.     Breath sounds: No wheezing.  Abdominal:     General: Bowel sounds are normal. There is no distension.     Palpations: Abdomen is soft. There is no mass.     Tenderness: There is no abdominal tenderness. There is no guarding or rebound.     Comments: Nontender no rigidity or guarding.  Musculoskeletal:        General: No tenderness. Normal range of motion.     Cervical back: Normal range of motion and neck supple.  Skin:    General: Skin is warm.     Coloration: Skin is pale.     Comments: Multiple bruises noted in bilateral upper extremity.  Neurological:     Mental Status: He is alert and oriented to person, place, and time.  Psychiatric:        Mood and Affect: Affect normal.      LABORATORY DATA:  I have reviewed the data as listed    Component Value Date/Time   NA 141 12/03/2020 0456   NA 142 05/03/2014 1139   K 3.8 12/03/2020 0456   K 4.7 05/03/2014 1139   CL 107 12/03/2020 0456   CL 110 (H) 05/03/2014 1139   CO2 28 12/03/2020 0456   CO2 24  05/03/2014 1139   GLUCOSE 158 (H) 12/03/2020 0456   GLUCOSE 98 05/03/2014 1139   BUN 25 (H) 12/03/2020 0456   BUN 20 (H) 05/03/2014 1139   CREATININE 0.83 12/03/2020 0456   CREATININE 1.22 08/09/2014 1122   CALCIUM 8.4 (L) 12/03/2020 0456   CALCIUM 8.6 05/03/2014 1139   PROT 6.3 (L) 11/30/2020 1432   PROT 7.5 05/03/2014 1139   ALBUMIN 3.7 11/30/2020 1432   ALBUMIN 4.1 05/03/2014 1139   AST 21 11/30/2020 1432   AST 15 05/03/2014 1139  ALT 12 11/30/2020 1432   ALT 26 05/03/2014 1139   ALKPHOS 71 11/30/2020 1432   ALKPHOS 94 05/03/2014 1139   BILITOT 2.6 (H) 11/30/2020 1432   BILITOT 0.5 05/03/2014 1139   GFRNONAA >60 12/03/2020 0456   GFRNONAA >60 08/09/2014 1122   GFRAA 55 (L) 01/24/2020 1156   GFRAA >60 08/09/2014 1122    No results found for: SPEP, UPEP  Lab Results  Component Value Date   WBC 11.9 (H) 12/03/2020   NEUTROABS 7.3 12/02/2020   HGB 8.4 (L) 12/03/2020   HCT 25.1 (L) 12/03/2020   MCV 98.0 12/03/2020   PLT 61 (L) 12/03/2020      Chemistry      Component Value Date/Time   NA 141 12/03/2020 0456   NA 142 05/03/2014 1139   K 3.8 12/03/2020 0456   K 4.7 05/03/2014 1139   CL 107 12/03/2020 0456   CL 110 (H) 05/03/2014 1139   CO2 28 12/03/2020 0456   CO2 24 05/03/2014 1139   BUN 25 (H) 12/03/2020 0456   BUN 20 (H) 05/03/2014 1139   CREATININE 0.83 12/03/2020 0456   CREATININE 1.22 08/09/2014 1122      Component Value Date/Time   CALCIUM 8.4 (L) 12/03/2020 0456   CALCIUM 8.6 05/03/2014 1139   ALKPHOS 71 11/30/2020 1432   ALKPHOS 94 05/03/2014 1139   AST 21 11/30/2020 1432   AST 15 05/03/2014 1139   ALT 12 11/30/2020 1432   ALT 26 05/03/2014 1139   BILITOT 2.6 (H) 11/30/2020 1432   BILITOT 0.5 05/03/2014 1139       RADIOGRAPHIC STUDIES: I have personally reviewed the radiological images as listed and agreed with the findings in the report. No results found.   ASSESSMENT & PLAN:  No problem-specific Assessment & Plan notes found for this  encounter.  No orders of the defined types were placed in this encounter.  All questions were answered. The patient knows to call the clinic with any problems, questions or concerns.      Cammie Sickle, MD 12/15/2020 8:38 AM

## 2020-12-16 DIAGNOSIS — D631 Anemia in chronic kidney disease: Secondary | ICD-10-CM | POA: Diagnosis present

## 2020-12-16 DIAGNOSIS — F17210 Nicotine dependence, cigarettes, uncomplicated: Secondary | ICD-10-CM | POA: Diagnosis present

## 2020-12-16 DIAGNOSIS — Z515 Encounter for palliative care: Secondary | ICD-10-CM | POA: Diagnosis not present

## 2020-12-16 DIAGNOSIS — M5134 Other intervertebral disc degeneration, thoracic region: Secondary | ICD-10-CM | POA: Diagnosis present

## 2020-12-16 DIAGNOSIS — E2740 Unspecified adrenocortical insufficiency: Secondary | ICD-10-CM | POA: Diagnosis present

## 2020-12-16 DIAGNOSIS — A0839 Other viral enteritis: Secondary | ICD-10-CM | POA: Diagnosis not present

## 2020-12-16 DIAGNOSIS — Z681 Body mass index (BMI) 19 or less, adult: Secondary | ICD-10-CM | POA: Diagnosis not present

## 2020-12-16 DIAGNOSIS — R627 Adult failure to thrive: Secondary | ICD-10-CM | POA: Diagnosis not present

## 2020-12-16 DIAGNOSIS — Z66 Do not resuscitate: Secondary | ICD-10-CM | POA: Diagnosis present

## 2020-12-16 DIAGNOSIS — A09 Infectious gastroenteritis and colitis, unspecified: Secondary | ICD-10-CM | POA: Diagnosis not present

## 2020-12-16 DIAGNOSIS — J189 Pneumonia, unspecified organism: Secondary | ICD-10-CM | POA: Diagnosis present

## 2020-12-16 DIAGNOSIS — M5137 Other intervertebral disc degeneration, lumbosacral region: Secondary | ICD-10-CM | POA: Diagnosis present

## 2020-12-16 DIAGNOSIS — N1831 Chronic kidney disease, stage 3a: Secondary | ICD-10-CM | POA: Diagnosis present

## 2020-12-16 DIAGNOSIS — K219 Gastro-esophageal reflux disease without esophagitis: Secondary | ICD-10-CM | POA: Diagnosis present

## 2020-12-16 DIAGNOSIS — N179 Acute kidney failure, unspecified: Secondary | ICD-10-CM | POA: Diagnosis present

## 2020-12-16 DIAGNOSIS — Z20822 Contact with and (suspected) exposure to covid-19: Secondary | ICD-10-CM | POA: Diagnosis present

## 2020-12-16 DIAGNOSIS — I129 Hypertensive chronic kidney disease with stage 1 through stage 4 chronic kidney disease, or unspecified chronic kidney disease: Secondary | ICD-10-CM | POA: Diagnosis present

## 2020-12-16 DIAGNOSIS — Z79899 Other long term (current) drug therapy: Secondary | ICD-10-CM | POA: Diagnosis not present

## 2020-12-16 DIAGNOSIS — Z8249 Family history of ischemic heart disease and other diseases of the circulatory system: Secondary | ICD-10-CM | POA: Diagnosis not present

## 2020-12-16 DIAGNOSIS — E1122 Type 2 diabetes mellitus with diabetic chronic kidney disease: Secondary | ICD-10-CM | POA: Diagnosis present

## 2020-12-16 DIAGNOSIS — E785 Hyperlipidemia, unspecified: Secondary | ICD-10-CM | POA: Diagnosis present

## 2020-12-16 DIAGNOSIS — C9110 Chronic lymphocytic leukemia of B-cell type not having achieved remission: Secondary | ICD-10-CM | POA: Diagnosis present

## 2020-12-16 DIAGNOSIS — G894 Chronic pain syndrome: Secondary | ICD-10-CM | POA: Diagnosis present

## 2020-12-16 DIAGNOSIS — E43 Unspecified severe protein-calorie malnutrition: Secondary | ICD-10-CM | POA: Diagnosis present

## 2020-12-16 DIAGNOSIS — E872 Acidosis: Secondary | ICD-10-CM | POA: Diagnosis present

## 2020-12-16 DIAGNOSIS — Z7984 Long term (current) use of oral hypoglycemic drugs: Secondary | ICD-10-CM | POA: Diagnosis not present

## 2020-12-16 DIAGNOSIS — F32A Depression, unspecified: Secondary | ICD-10-CM | POA: Diagnosis present

## 2020-12-16 DIAGNOSIS — R55 Syncope and collapse: Secondary | ICD-10-CM | POA: Diagnosis present

## 2020-12-16 LAB — BASIC METABOLIC PANEL
Anion gap: 8 (ref 5–15)
BUN: 27 mg/dL — ABNORMAL HIGH (ref 8–23)
CO2: 27 mmol/L (ref 22–32)
Calcium: 8.3 mg/dL — ABNORMAL LOW (ref 8.9–10.3)
Chloride: 103 mmol/L (ref 98–111)
Creatinine, Ser: 1.39 mg/dL — ABNORMAL HIGH (ref 0.61–1.24)
GFR, Estimated: 54 mL/min — ABNORMAL LOW (ref >60)
Glucose, Bld: 104 mg/dL — ABNORMAL HIGH (ref 70–99)
Potassium: 4.3 mmol/L (ref 3.5–5.1)
Sodium: 138 mmol/L (ref 135–145)

## 2020-12-16 LAB — IRON AND TIBC
Iron: 174 ug/dL (ref 45–182)
Saturation Ratios: 86 % — ABNORMAL HIGH (ref 17.9–39.5)
TIBC: 203 ug/dL — ABNORMAL LOW (ref 250–450)
UIBC: 29 ug/dL

## 2020-12-16 LAB — GASTROINTESTINAL PANEL BY PCR, STOOL (REPLACES STOOL CULTURE)

## 2020-12-16 LAB — CBC
HCT: 20.1 % — ABNORMAL LOW (ref 39.0–52.0)
Hemoglobin: 6.5 g/dL — ABNORMAL LOW (ref 13.0–17.0)
MCH: 33.5 pg (ref 26.0–34.0)
MCHC: 32.3 g/dL (ref 30.0–36.0)
MCV: 103.6 fL — ABNORMAL HIGH (ref 80.0–100.0)
Platelets: 127 10*3/uL — ABNORMAL LOW (ref 150–400)
RBC: 1.94 MIL/uL — ABNORMAL LOW (ref 4.22–5.81)
RDW: 19.9 % — ABNORMAL HIGH (ref 11.5–15.5)
WBC: 7 10*3/uL (ref 4.0–10.5)
nRBC: 0 % (ref 0.0–0.2)

## 2020-12-16 LAB — GLUCOSE, CAPILLARY
Glucose-Capillary: 89 mg/dL (ref 70–99)
Glucose-Capillary: 104 mg/dL — ABNORMAL HIGH (ref 70–99)
Glucose-Capillary: 164 mg/dL — ABNORMAL HIGH (ref 70–99)
Glucose-Capillary: 102 mg/dL — ABNORMAL HIGH (ref 70–99)

## 2020-12-16 LAB — PREPARE RBC (CROSSMATCH)

## 2020-12-16 LAB — C DIFFICILE QUICK SCREEN W PCR REFLEX
C Diff antigen: NEGATIVE
C Diff interpretation: NOT DETECTED
C Diff toxin: NEGATIVE

## 2020-12-16 LAB — FERRITIN: Ferritin: 1063 ng/mL — ABNORMAL HIGH (ref 24–336)

## 2020-12-16 MED ORDER — LOPERAMIDE HCL 2 MG PO CAPS: 2.0000 mg | ORAL_CAPSULE | ORAL | Status: DC | PRN

## 2020-12-16 MED ORDER — INSULIN ASPART 100 UNIT/ML IJ SOLN
0.0000 [IU] | Freq: Three times a day (TID) | INTRAMUSCULAR | Status: DC
  Administered 2020-12-16 – 2020-12-19 (×6): 1 [IU] via SUBCUTANEOUS
  Filled 2020-12-16 (×6): qty 1

## 2020-12-16 MED ORDER — SODIUM CHLORIDE 0.9% IV SOLUTION: Freq: Once | INTRAVENOUS | Status: DC

## 2020-12-16 MED ORDER — INSULIN ASPART 100 UNIT/ML IJ SOLN: 0.0000 [IU] | Freq: Every day | INTRAMUSCULAR | Status: DC

## 2020-12-16 MED ORDER — ENOXAPARIN SODIUM 40 MG/0.4ML IJ SOSY: 40.0000 mg | PREFILLED_SYRINGE | INTRAMUSCULAR | Status: DC

## 2020-12-16 NOTE — Assessment & Plan Note (Addendum)
-   baseline creatinine ~ 0.8 - 1 - patient presents with increase in creat >0.3 mg/dL above baseline, creat increase >1.5x baseline presumed to have occurred within past 7 days PTA -Creatinine 1.74 on admission - Etiology presumed prerenal/volume depletion due to history of poor oral intake prior to admission - Creatinine resolved back to normal with fluids.  Discontinue fluids on 12/18/2020

## 2020-12-16 NOTE — Progress Notes (Signed)
Progress Note    James [name]   CBS:[id number]  DOB: 12-14-1950  DOA: 12/15/2020     0  PCP: [name], MD  Initial CC: weakness  Hospital Course: James [name] is a 69 year old male with PMH stage IV CLL, depression, DM II, hypertension, hyperlipidemia, GERD who presented to the hospital with generalized weakness.  He was recently hospitalized earlier in August for CAP. In general he has had an overall progressive decline with recurrent illnesses and having difficulty recovering from each per oncology. He has had worsening oral intake and associated lightheadedness/dizziness at home. He is admitted for further evaluation with oncology and evaluation by palliative care with consideration for pursuing hospice.  Interval History:  Seen in his room this morning.  No family present.  He was resting in bed comfortably but definitely deconditioned and weak appearing.  ROS: Constitutional: negative for chills and fevers, Respiratory: negative for cough and sputum, Cardiovascular: negative for chest pain, and Gastrointestinal: negative for abdominal pain  Assessment & Plan: * Failure to thrive in adult - History of stage IV CLL (dx 2006) - overall decline recently and has had recurrent hospitalizations and has been referred to [facility] for chronic idiopathic abdominal pain.  He also has an intrathecal pain pump placed on 02/07/2020. -Evaluated by oncology on admission.  Recommendation is to consider involving hospice at this time given overall progressive decline - continue supportive care for now; appreciate palliative care involvement   AKI (acute kidney injury) ([place]) - baseline creatinine ~ 0.8 - 1 - patient presents with increase in creat >0.3 mg/dL above baseline, creat increase >1.5x baseline presumed to have occurred within past 7 days PTA -Creatinine 1.74 on admission - Etiology presumed prerenal/volume depletion due to history of poor oral intake prior to admission -  Creatinine improving with fluids   Macrocytic anemia - MCV 103, Hgb 6.5 g/dL on admission - discussed with oncology; will transfuse 1 unit PRBC - check iron stores; if low will give IV iron as well   Adrenal insufficiency due to cancer therapy ([place]) - Continue hydrocortisone  Diabetes mellitus type 2, uncomplicated (H[facility]) - W4Y 6.5% on 11/30/2020 - Continue SSI and CBG monitoring  CLL (chronic lymphocytic leukemia) ([place]) - see failure to thrive     Old records reviewed in assessment of this patient  Antimicrobials:   DVT prophylaxis: SCD   Code Status:   Code Status: DNR Family Communication:   Disposition Plan: Status is: Inpatient  Remains inpatient appropriate because:Unsafe d/c plan and Inpatient level of care appropriate due to severity of illness  Dispo: The patient is from: Home              Anticipated d/c is to:  pending palliative care consult              Patient currently is not medically stable to d/c.   Difficult to place patient No  Risk of unplanned readmission score:     Objective: Blood pressure 114/66, pulse 62, temperature 97.8 F (36.6 C), temperature source Oral, resp. rate 20, height 5\' 8"  (1.727 m), weight 50 kg, SpO2 97 %.  Examination: General appearance: cooperative, fatigued, no distress, and chronically ill appearing Head: Normocephalic, without obvious abnormality, atraumatic Eyes:  EOMI Lungs: clear to auscultation bilaterally Heart: regular rate and rhythm and S1, S2 normal Abdomen: soft, NT, ND, BS present Extremities:  no edema Skin:  senile purpura Neurologic: Grossly normal  Consultants:    Procedures:    Data Reviewed: I  have personally reviewed following labs and imaging studies Results for orders placed or performed during the hospital encounter of 12/15/20 (from the past 24 hour(s))  CBC     Status: Abnormal   Collection Time: 12/16/20  4:58 AM  Result Value Ref Range   WBC 7.0 4.0 - 10.5 K/uL   RBC 1.94 (L)  4.22 - 5.81 MIL/uL   Hemoglobin 6.5 (L) 13.0 - 17.0 g/dL   HCT 20.1 (L) 39.0 - 52.0 %   MCV 103.6 (H) 80.0 - 100.0 fL   MCH 33.5 26.0 - 34.0 pg   MCHC 32.3 30.0 - 36.0 g/dL   RDW 19.9 (H) 11.5 - 15.5 %   Platelets 127 (L) 150 - 400 K/uL   nRBC 0.0 0.0 - 0.2 %  Basic metabolic panel     Status: Abnormal   Collection Time: 12/16/20  4:58 AM  Result Value Ref Range   Sodium 138 135 - 145 mmol/L   Potassium 4.3 3.5 - 5.1 mmol/L   Chloride 103 98 - 111 mmol/L   CO2 27 22 - 32 mmol/L   Glucose, Bld 104 (H) 70 - 99 mg/dL   BUN 27 (H) 8 - 23 mg/dL   Creatinine, Ser 1.39 (H) 0.61 - 1.24 mg/dL   Calcium 8.3 (L) 8.9 - 10.3 mg/dL   GFR, Estimated 54 (L) >60 mL/min   Anion gap 8 5 - 15  Glucose, capillary     Status: None   Collection Time: 12/16/20  7:46 AM  Result Value Ref Range   Glucose-Capillary 89 70 - 99 mg/dL  Glucose, capillary     Status: Abnormal   Collection Time: 12/16/20 11:32 AM  Result Value Ref Range   Glucose-Capillary 104 (H) 70 - 99 mg/dL  Prepare RBC (crossmatch)     Status: None (Preliminary result)   Collection Time: 12/16/20 12:46 PM  Result Value Ref Range   Order Confirmation PENDING     Recent Results (from the past 240 hour(s))  Blood culture (single)     Status: None (Preliminary result)   Collection Time: 12/15/20 10:18 AM   Specimen: BLOOD  Result Value Ref Range Status   Specimen Description BLOOD BLOOD LEFT FOREARM  Final   Special Requests   Final    BOTTLES DRAWN AEROBIC AND ANAEROBIC Blood Culture results may not be optimal due to an inadequate volume of blood received in culture bottles   Culture   Final    NO GROWTH < 24 HOURS Performed at [facility], [address], [place], [place] [address]    Report Status PENDING  Incomplete  Resp Panel by RT-PCR (Flu A&B, Covid) Nasopharyngeal Swab     Status: None   Collection Time: 12/15/20 12:04 PM   Specimen: Nasopharyngeal Swab; Nasopharyngeal(NP) swabs in vial transport medium   Result Value Ref Range Status   SARS Coronavirus 2 by RT PCR NEGATIVE NEGATIVE Final    Comment: (NOTE) SARS-CoV-2 target nucleic acids are NOT DETECTED.  The SARS-CoV-2 RNA is generally detectable in upper respiratory specimens during the acute phase of infection. The lowest concentration of SARS-CoV-2 viral copies this assay can detect is 138 copies/mL. A negative result does not preclude SARS-Cov-2 infection and should not be used as the sole basis for treatment or other patient management decisions. A negative result may occur with  improper specimen collection/handling, submission of specimen other than nasopharyngeal swab, presence of viral mutation(s) within the areas targeted by this assay, and inadequate number of viral  copies(<138 copies/mL). A negative result must be combined with clinical observations, patient history, and epidemiological information. The expected result is Negative.  Fact Sheet for Patients:  EntrepreneurPulse.com.au  Fact Sheet for Healthcare Providers:  IncredibleEmployment.be  This test is no t yet approved or cleared by the [place] FDA and  has been authorized for detection and/or diagnosis of SARS-CoV-2 by FDA under an Emergency Use Authorization (EUA). This EUA will remain  in effect (meaning this test can be used) for the duration of the COVID-19 declaration under Section 564(b)(1) of the Act, 21 U.S.C.section 360bbb-3(b)(1), unless the authorization is terminated  or revoked sooner.       Influenza A by PCR NEGATIVE NEGATIVE Final   Influenza B by PCR NEGATIVE NEGATIVE Final    Comment: (NOTE) The Xpert Xpress SARS-CoV-2/FLU/RSV plus assay is intended as an aid in the diagnosis of influenza from Nasopharyngeal swab specimens and should not be used as a sole basis for treatment. Nasal washings and aspirates are unacceptable for Xpert Xpress SARS-CoV-2/FLU/RSV testing.  Fact Sheet for  Patients: EntrepreneurPulse.com.au  Fact Sheet for Healthcare Providers: IncredibleEmployment.be  This test is not yet approved or cleared by the [place] FDA and has been authorized for detection and/or diagnosis of SARS-CoV-2 by FDA under an Emergency Use Authorization (EUA). This EUA will remain in effect (meaning this test can be used) for the duration of the COVID-19 declaration under Section 564(b)(1) of the Act, 21 U.S.C. section 360bbb-3(b)(1), unless the authorization is terminated or revoked.  Performed at [facility], [address] [place], [place] [address]      Radiology Studies: [facility] Chest [place] 1 View  Result Date: 12/15/2020 CLINICAL DATA:  Follow-up pneumonia.  Syncope.  History of leukemia EXAM: PORTABLE CHEST 1 VIEW COMPARISON:  11/30/2020 FINDINGS: Normal heart size. Atherosclerotic calcification of the aortic knob. Interval resolution of previously seen left lower lobe pneumonia. No focal airspace consolidation, pleural effusion, or pneumothorax. IMPRESSION: No acute cardiopulmonary abnormality. Electronically Signed   By: Davina Poke D.O.   On: 12/15/2020 11:24   DG Chest Port 1 View  Final Result      Scheduled Meds:  sodium chloride   Intravenous Once   feeding supplement  237 mL Oral TID BM   folic acid  1 mg Oral Daily   gabapentin  300 mg Oral BID   hydrocortisone  20 mg Oral QPM   hydrocortisone  30 mg Oral Daily   insulin aspart  0-5 Units Subcutaneous QHS   insulin aspart  0-6 Units Subcutaneous TID WC   multivitamin with minerals  1 tablet Oral Daily   naloxegol oxalate  25 mg Oral Daily   pantoprazole  40 mg Oral QHS   PRN Meds: acetaminophen **OR** acetaminophen, ondansetron **OR** ondansetron (ZOFRAN) IV, oxyCODONE Continuous Infusions:  lactated ringers 100 mL/hr at 12/16/20 0609     LOS: 0 days  Time spent: Greater than 50% of the 35 minute visit was spent in  counseling/coordination of care for the patient as laid out in the A&P.   Dwyane Dee, MD [facility] 12/16/2020, 2:11 PM

## 2020-12-16 NOTE — Assessment & Plan Note (Addendum)
-   MCV 103, Hgb 6.5 g/dL on admission - discussed with oncology; will transfuse 1 unit PRBC on 8/21 -Iron stores adequate.  Holding off on iron repletion - Hemoglobin remaining stable

## 2020-12-16 NOTE — Assessment & Plan Note (Addendum)
-   History of stage IV CLL (dx 2006) - overall decline recently and has had recurrent hospitalizations and has been referred to [facility] for chronic idiopathic abdominal pain.  He also has an intrathecal pain pump placed on 02/07/2020. -Evaluated by oncology on admission.  Recommendation is to consider involving hospice at this time given overall progressive decline - continue supportive care for now; appreciate palliative care involvement -Tentative plan at this time is for discharge home on Wednesday and then he plans to go stay with his son in [place].

## 2020-12-16 NOTE — Assessment & Plan Note (Signed)
-   A1c 5.6% on 11/30/2020 - Continue SSI and CBG monitoring

## 2020-12-16 NOTE — TOC Progression Note (Signed)
Transition of Care [facility]) - Progression Note    Patient Details  Name: James [name] MRN: [id number] Date of Birth: 1950/09/26  Transition of Care [facility]) CM/SW [facility], RN Phone Number: 12/16/2020, 11:20 AM  Clinical Narrative:   Spoke to care team, they would like palliative medicine to continue discussions with patient and family on Monday for home hospice vs. Hospice house.  Patient states he would like to go home, but he and son are aware of care teams recommendation.  They would like to discuss further with team on Monday as well.  TOC contact information given, TOC to follow to discharge.           Expected Discharge Plan and Services                                                 Social Determinants of Health (SDOH) Interventions    Readmission Risk Interventions Readmission Risk Prevention Plan 12/01/2020  Transportation Screening Complete  Medication Review ([place]) Complete  PCP or Specialist appointment within 3-5 days of discharge Complete  HRI or [place] Complete  SW Recovery Care/Counseling Consult Complete  [place] Not Applicable  Some recent data might be hidden

## 2020-12-16 NOTE — Assessment & Plan Note (Signed)
-   Continue hydrocortisone

## 2020-12-16 NOTE — Assessment & Plan Note (Signed)
-   see failure to thrive

## 2020-12-16 NOTE — Hospital Course (Signed)
James [name] is a 70 year old male with PMH stage IV CLL, depression, DM II, hypertension, hyperlipidemia, GERD who presented to the hospital with generalized weakness.  He was recently hospitalized earlier in August for CAP. In general he has had an overall progressive decline with recurrent illnesses and having difficulty recovering from each per oncology. He has had worsening oral intake and associated lightheadedness/dizziness at home. He is admitted for further evaluation with oncology and evaluation by palliative care with consideration for pursuing hospice.

## 2020-12-16 NOTE — Progress Notes (Signed)
PHARMACIST - PHYSICIAN COMMUNICATION  CONCERNING:  Enoxaparin (Lovenox) for DVT Prophylaxis    RECOMMENDATION: Patient was prescribed enoxaparin 30mg  q24 hours for VTE prophylaxis.   Filed Weights   12/15/20 1450  Weight: 50 kg (110 lb 3.7 oz)    Body mass index is 16.76 kg/m.  Estimated Creatinine Clearance: 34.5 mL/min (A) (by C-G formula based on SCr of 1.39 mg/dL (H)).   Patient is candidate for enoxaparin 40mg  every 24 hours based on CrCl >57ml/min and Weight >45kg  DESCRIPTION: Pharmacy has adjusted enoxaparin dose per [facility] policy.  Patient is now receiving enoxaparin 40 mg every 24 hours   Benita Gutter 12/16/2020 7:55 AM

## 2020-12-16 NOTE — Consult Note (Signed)
[place] NOTE  Patient Care Team: [name], MD as PCP - General (Internal Medicine) Cammie Sickle, MD as Consulting Physician (Hematology and Oncology) Borders, Kirt Boys, NP as Nurse Practitioner (Hospice and Palliative Medicine) Verlon Au, NP as Nurse Practitioner (Hematology and Oncology) Jacquelin Hawking, NP as Nurse Practitioner (Oncology) Lonell Face, NP as Nurse Practitioner (Neurosurgery) Milinda Pointer, MD as Consulting Physician (Pain Medicine)  CHIEF COMPLAINTS/PURPOSE OF CONSULTATION: CLL/anemia  HISTORY OF PRESENTING ILLNESS:  James [name] 70 y.o.  male with multiple medical problems including CLL/constipation/diabetes depression/chronic abdominal pain on pain pump/possible adrenal insufficiency on hydrocortisone is currently admitted for presyncope/generalized weakness.  Of note patient was recently discharged in the hospital about 10 days ago after being treated with antibiotics for sepsis/community-acquired pneumonia.  On admission emergency room at this visit patient noted to have hemoglobin of 6.5.  White count of 17.9.  Chest x-ray no acute process.  Lactic acid was slightly elevated however improved with hydration.  Otherwise patient denies any fevers or chills.  Shortness of breath on exertion.  Poor appetite.  Weight loss.  Chronic abdominal pain.     Review of Systems  Constitutional:  Positive for malaise/fatigue and weight loss. Negative for chills, diaphoresis and fever.  HENT:  Negative for nosebleeds and sore throat.   Eyes:  Negative for double vision.  Respiratory:  Positive for shortness of breath. Negative for cough, hemoptysis, sputum production and wheezing.   Cardiovascular:  Negative for chest pain, palpitations, orthopnea and leg swelling.  Gastrointestinal:  Positive for abdominal pain, nausea and vomiting. Negative for blood in stool, constipation, diarrhea, heartburn and melena.   Genitourinary:  Negative for dysuria, frequency and urgency.  Musculoskeletal:  Positive for back pain. Negative for joint pain.  Skin: Negative.  Negative for itching and rash.  Neurological:  Positive for dizziness and weakness. Negative for tingling, focal weakness and headaches.  Endo/Heme/Allergies:  Does not bruise/bleed easily.  Psychiatric/Behavioral:  Negative for depression. The patient is not nervous/anxious and does not have insomnia.     MEDICAL HISTORY:  Past Medical History:  Diagnosis Date   CLL (chronic lymphocytic leukemia) ([place])    Constipation    Depression    Diabetes mellitus without complication (HCC)    GERD (gastroesophageal reflux disease)    Hematuria    Hyperlipidemia    Hypertension    Lactic acidosis 12/15/2020   Therapeutic opioid induced constipation     SURGICAL HISTORY: Past Surgical History:  Procedure Laterality Date   CATARACT EXTRACTION W/ INTRAOCULAR LENS IMPLANT Bilateral    CHOLECYSTECTOMY  1983   COLONOSCOPY WITH PROPOFOL N/A 10/27/2017   Procedure: COLONOSCOPY WITH PROPOFOL;  Surgeon: Manya Silvas, MD;  Location[facility] ENDOSCOPY;  Service: Endoscopy;  Laterality: N/A;   ESOPHAGOGASTRODUODENOSCOPY (EGD) WITH PROPOFOL N/A 11/20/2016   Procedure: ESOPHAGOGASTRODUODENOSCOPY (EGD) WITH PROPOFOL;  Surgeon: Manya Silvas, MD;  Location: [facility] ENDOSCOPY;  Service: Endoscopy;  Laterality: N/A;   ESOPHAGOGASTRODUODENOSCOPY (EGD) WITH PROPOFOL N/A 10/27/2017   Procedure: ESOPHAGOGASTRODUODENOSCOPY (EGD) WITH PROPOFOL;  Surgeon: Manya Silvas, MD;  Location: [facility] ENDOSCOPY;  Service: Endoscopy;  Laterality: N/A;   ESOPHAGOGASTRODUODENOSCOPY (EGD) WITH PROPOFOL N/A 11/23/2020   Procedure: ESOPHAGOGASTRODUODENOSCOPY (EGD) WITH PROPOFOL;  Surgeon: Jonathon Bellows, MD;  Location: [facility] ENDOSCOPY;  Service: Gastroenterology;  Laterality: N/A;   INTRATHECAL PUMP IMPLANT Left 02/07/2020   Procedure: INTRATHECAL PUMP & CATHETER IMPLANT;  Surgeon: Deetta Perla, MD;  Location: [facility] ORS;  Service: Neurosurgery;  Laterality: Left;  LOWER EXTREMITY ANGIOGRAPHY Right 05/19/2017   Procedure: LOWER EXTREMITY ANGIOGRAPHY;  Surgeon: Algernon Huxley, MD;  Location: [place] CV LAB;  Service: Cardiovascular;  Laterality: Right;   LOWER EXTREMITY ANGIOGRAPHY Left 08/10/2018   Procedure: LOWER EXTREMITY ANGIOGRAPHY;  Surgeon: Algernon Huxley, MD;  Location: [place] CV LAB;  Service: Cardiovascular;  Laterality: Left;    SOCIAL HISTORY: Social History   Socioeconomic History   Marital status: Married    Spouse name: [name]   Number of children: 1   Years of education: Not on file   Highest education level: Not on file  Occupational History   Occupation: retired    Comment: truck driver  Tobacco Use   Smoking status: Every Day    Packs/day: 0.50    Years: 47.00    Pack years: 23.50    Types: Cigarettes   Smokeless tobacco: Never  Vaping Use   Vaping Use: Never used  Substance and Sexual Activity   Alcohol use: Yes    Alcohol/week: 1.0 standard drink    Types: 1 Glasses of wine per week    Comment:  almost none in last 6 months   Drug use: No   Sexual activity: Never    Partners: Female  Other Topics Concern   Not on file  Social History Narrative   Not on file   Social Determinants of Health   Financial Resource Strain: Not on file  Food Insecurity: Not on file  Transportation Needs: Not on file  Physical Activity: Not on file  Stress: Not on file  Social Connections: Not on file  Intimate Partner Violence: Not on file    FAMILY HISTORY: Family History  Problem Relation Age of Onset   Hypertension Sister     ALLERGIES:  has No Known Allergies.  MEDICATIONS:  Current Facility-Administered Medications  Medication Dose Route Frequency Provider Last Rate Last Admin   0.9 %  sodium chloride infusion (Manually program via Guardrails IV Fluids)   Intravenous Once Dwyane Dee, MD       acetaminophen (TYLENOL) tablet  650 mg  650 mg Oral Q6H PRN Agbata, Tochukwu, MD       Or   acetaminophen (TYLENOL) suppository 650 mg  650 mg Rectal Q6H PRN Agbata, Tochukwu, MD       feeding supplement (ENSURE ENLIVE / ENSURE PLUS) liquid 237 mL  237 mL Oral TID BM Agbata, Tochukwu, MD       folic acid (FOLVITE) tablet 1 mg  1 mg Oral Daily Agbata, Tochukwu, MD   1 mg at 12/16/20 1287   gabapentin (NEURONTIN) capsule 300 mg  300 mg Oral BID Agbata, Tochukwu, MD   300 mg at 12/16/20 2222   hydrocortisone (CORTEF) tablet 20 mg  20 mg Oral QPM Agbata, Tochukwu, MD   20 mg at 12/16/20 1750   hydrocortisone (CORTEF) tablet 30 mg  30 mg Oral Daily Agbata, Tochukwu, MD   30 mg at 12/16/20 8676   insulin aspart (novoLOG) injection 0-5 Units  0-5 Units Subcutaneous QHS Dwyane Dee, MD       insulin aspart (novoLOG) injection 0-6 Units  0-6 Units Subcutaneous TID WC Dwyane Dee, MD   1 Units at 12/16/20 1750   lactated ringers infusion   Intravenous Continuous Agbata, Tochukwu, MD 100 mL/hr at 12/16/20 2133 New Bag at 12/16/20 2133   loperamide (IMODIUM) capsule 2 mg  2 mg Oral Q4H PRN Dwyane Dee, MD       multivitamin with minerals tablet  1 tablet  1 tablet Oral Daily Agbata, Tochukwu, MD   1 tablet at 12/16/20 0904   naloxegol oxalate (MOVANTIK) tablet 25 mg  25 mg Oral Daily Agbata, Tochukwu, MD   25 mg at 12/16/20 0904   ondansetron (ZOFRAN) tablet 4 mg  4 mg Oral Q6H PRN Agbata, Tochukwu, MD       Or   ondansetron (ZOFRAN) injection 4 mg  4 mg Intravenous Q6H PRN Agbata, Tochukwu, MD       oxyCODONE (Oxy IR/ROXICODONE) immediate release tablet 10 mg  10 mg Oral Q6H PRN Athena Masse, MD   10 mg at 12/16/20 0904   pantoprazole (PROTONIX) EC tablet 40 mg  40 mg Oral QHS Agbata, Tochukwu, MD   40 mg at 12/16/20 2222      .  PHYSICAL EXAMINATION:  Vitals:   12/16/20 2006 12/16/20 2133  BP: 112/73 117/72  Pulse:  63  Resp: 16 16  Temp:  98.4 F (36.9 C)  SpO2: 100% 100%   Filed Weights   12/15/20 1450   Weight: 110 lb 3.7 oz (50 kg)    Physical Exam Vitals and nursing note reviewed.  Constitutional:      Comments: Patient resting in the bed.  Cachectic appearing.  Alone.    HENT:     Head: Normocephalic and atraumatic.     Mouth/Throat:     Mouth: Mucous membranes are moist.     Pharynx: No oropharyngeal exudate.  Eyes:     Pupils: Pupils are equal, round, and reactive to light.  Cardiovascular:     Rate and Rhythm: Normal rate and regular rhythm.  Pulmonary:     Effort: No respiratory distress.     Breath sounds: No wheezing.     Comments: Decreased breath sounds bilaterally at bases.  No wheeze or crackles Abdominal:     General: Bowel sounds are normal. There is no distension.     Palpations: Abdomen is soft. There is no mass.     Tenderness: There is no abdominal tenderness. There is no guarding or rebound.  Musculoskeletal:        General: No tenderness. Normal range of motion.     Cervical back: Normal range of motion and neck supple.  Skin:    General: Skin is warm.  Neurological:     Mental Status: He is alert and oriented to person, place, and time.  Psychiatric:        Mood and Affect: Affect normal.        Judgment: Judgment normal.     LABORATORY DATA:  I have reviewed the data as listed Lab Results  Component Value Date   WBC 7.0 12/16/2020   HGB 6.5 (L) 12/16/2020   HCT 20.1 (L) 12/16/2020   MCV 103.6 (H) 12/16/2020   PLT 127 (L) 12/16/2020   Recent Labs    01/04/20 0932 01/24/20 1156 02/01/20 0940 11/21/20 1400 11/22/20 0441 11/30/20 1432 12/01/20 0443 12/03/20 0456 12/15/20 1019 12/16/20 0458  NA 139 138   < > 136   < >  --    < > 141 140 138  K 4.9 6.2*   < > 3.3*   < >  --    < > 3.8 4.4 4.3  CL 102 102   < > 105   < >  --    < > 107 100 103  CO2 26 26   < > 22   < >  --    < >  28 22 27   GLUCOSE 163* 160*   < > 101*   < >  --    < > 158* 91 104*  BUN 36* 37*   < > 49*   < >  --    < > 25* 28* 27*  CREATININE 1.16 1.47*   < >  1.58*   < >  --    < > 0.83 1.74* 1.39*  CALCIUM 9.2 9.2   < > 8.1*   < >  --    < > 8.4* 8.8* 8.3*  GFRNONAA >60 48*   < > 46*   < >  --    < > >60 41* 54*  GFRAA >60 55*  --   --   --   --   --   --   --   --   PROT  --   --    < > 5.5*  --  6.3*  --   --  6.7  --   ALBUMIN  --   --    < > 3.7  --  3.7  --   --  3.9  --   AST  --   --    < > 12*  --  21  --   --  21  --   ALT  --   --    < > 14  --  12  --   --  24  --   ALKPHOS  --   --    < > 51  --  71  --   --  101  --   BILITOT  --   --    < > 2.2*  --  2.6*  --   --  1.8*  --   BILIDIR  --   --   --   --   --  0.9*  --   --   --   --   IBILI  --   --   --   --   --  1.7*  --   --   --   --    < > = values in this interval not displayed.    RADIOGRAPHIC STUDIES: I have personally reviewed the radiological images as listed and agreed with the findings in the report. CT ABDOMEN PELVIS W CONTRAST  Result Date: 11/30/2020 CLINICAL DATA:  Epigastric pain for 5 days.  Generalized weakness EXAM: CT ABDOMEN AND PELVIS WITH CONTRAST TECHNIQUE: Multidetector CT imaging of the abdomen and pelvis was performed using the standard protocol following bolus administration of intravenous contrast. CONTRAST:  63mL OMNIPAQUE IOHEXOL 350 MG/ML SOLN COMPARISON:  11/21/2020 FINDINGS: Lower chest: New mixed density airspace opacity within the left lower lobe. Right lung bases clear. Heart size is normal. Hepatobiliary: Status post cholecystectomy. Similar degree of intrahepatic biliary dilatation. No focal liver lesion is identified. Pancreas: Unremarkable. No pancreatic ductal dilatation or surrounding inflammatory changes. Spleen: Spleen is upper limits of normal in size without focal lesion. Adrenals/Urinary Tract: Unremarkable adrenal glands. Stable bilateral renal cysts. Two tiny right-sided renal stones without hydronephrosis. No left-sided renal stone or hydronephrosis. Urinary bladder is mildly distended but appears otherwise unremarkable. Stomach/Bowel:  Stomach is within normal limits. Appendix appears normal. No evidence of bowel wall thickening, distention, or inflammatory changes. Vascular/Lymphatic: Advanced atherosclerosis throughout the aortoiliac axis with bi-iliac stents. No aneurysm. Similar degree of retroperitoneal lymphadenopathy. Reference left para-aortic nodal mass measuring approximately 4.3 x 2.4 cm (series  3, image 36), unchanged. Reproductive: Prostatomegaly. Other: No free fluid. No abdominopelvic fluid collection. No pneumoperitoneum. No abdominal wall hernia. Musculoskeletal: No acute or significant osseous findings. Neurostimulator is again seen entering at the L3-4 level and extending into the thoracic region, beyond the field of view. IMPRESSION: 1. New airspace opacity within the left lower lobe compatible with pneumonia. 2. Stable retroperitoneal lymphadenopathy. 3. Nonobstructing right nephrolithiasis. 4. Prostatomegaly. 5. Aortic atherosclerosis (ICD10-I70.0). Electronically Signed   By: Davina Poke D.O.   On: 11/30/2020 15:31   CT ABDOMEN PELVIS W CONTRAST  Result Date: 11/21/2020 CLINICAL DATA:  Acute nonlocalized epigastric pain. Weakness and lethargy. Question perforation. Being treated for leukemia. EXAM: CT ABDOMEN AND PELVIS WITH CONTRAST TECHNIQUE: Multidetector CT imaging of the abdomen and pelvis was performed using the standard protocol following bolus administration of intravenous contrast. CONTRAST:  24mL OMNIPAQUE IOHEXOL 350 MG/ML SOLN COMPARISON:  01/24/2020 FINDINGS: Lower chest: Lung bases are clear.  No pleural or pericardial fluid. Hepatobiliary: Previous cholecystectomy. No significant liver parenchymal finding. Mild ductal prominence, within normal limits following cholecystectomy. Pancreas: Normal Spleen: Spleen upper limits of normal in size.  No focal lesion. Adrenals/Urinary Tract: Adrenal glands are normal. There are bilateral renal cysts. No evidence of mass or hydronephrosis. One or 2 punctate  nonobstructing stones in the lower pole of the right kidney. Stomach/Bowel: Stomach is normal. Small bowel is normal. Moderate amount of gas and fecal matter in the colon without evidence of inflammatory disease or frank obstruction. Normal appearing appendix. Vascular/Lymphatic: Aortic atherosclerosis. No aneurysm. IVC is normal. Retroperitoneal adenopathy is similar to the previous study, the largest nodal mass on the left at the level of the kidney measuring 4.2 x 2.8 cm. Other retroperitoneal nodes similarly stable. Reproductive: Enlarged prostate. Other: No free fluid or air. Musculoskeletal: No fracture or focal bone lesion. Neurostimulator in place, entering at the L3-4 level and extending into the thoracic region. IMPRESSION: No evidence of acute bowel pathology. No evidence of perforation as questioned clinically. Moderate amount of gas and fecal matter in the colon, possibly due to opioid use. Spleen upper limits of normal in size. Retroperitoneal lymphadenopathy, similar to the study of September 2021. Enlarged prostate. Aortic Atherosclerosis (ICD10-I70.0). Electronically Signed   By: Nelson Chimes M.D.   On: 11/21/2020 15:26   DG Chest Port 1 View  Result Date: 12/15/2020 CLINICAL DATA:  Follow-up pneumonia.  Syncope.  History of leukemia EXAM: PORTABLE CHEST 1 VIEW COMPARISON:  11/30/2020 FINDINGS: Normal heart size. Atherosclerotic calcification of the aortic knob. Interval resolution of previously seen left lower lobe pneumonia. No focal airspace consolidation, pleural effusion, or pneumothorax. IMPRESSION: No acute cardiopulmonary abnormality. Electronically Signed   By: Davina Poke D.O.   On: 12/15/2020 11:24   DG Chest Port 1 View  Result Date: 11/30/2020 CLINICAL DATA:  Questionable sepsis. EXAM: PORTABLE CHEST 1 VIEW COMPARISON:  Chest x-ray 11/21/2020, CT chest 01/24/2020 FINDINGS: The heart size and mediastinal contours are unchanged. Aortic calcification. Interval development of  patchy airspace opacity of the left lower lobe. No pulmonary edema. No pleural effusion. No pneumothorax. No acute osseous abnormality. Degenerative changes of bilateral acromioclavicular joints. IMPRESSION: Left lower lobe pneumonia/aspiration pneumonia Followup PA and lateral chest X-ray is recommended in 3-4 weeks following therapy to ensure resolution and exclude underlying malignancy. Electronically Signed   By: Iven Finn M.D.   On: 11/30/2020 15:34   DG Chest Port 1 View  Result Date: 11/21/2020 CLINICAL DATA:  Questionable sepsis. Weakness. Lethargy. Being treated for leukemia. EXAM:  PORTABLE CHEST 1 VIEW COMPARISON:  09/12/2019 FINDINGS: The heart size and mediastinal contours are within normal limits. Ordinary age related aortic atherosclerotic calcification. Both lungs are clear. The visualized skeletal structures are unremarkable. IMPRESSION: No active disease. Electronically Signed   By: Nelson Chimes M.D.   On: 11/21/2020 39:87    #70 year old male patient with a history of CLL/relapsed bone marrow/11 P deletion 7 q. Deletion/ chronic abdominal pain of unclear etiology-s/p pain pump; question adrenal insufficiency is currently admitted to hospital for worsening generalized weakness/presyncope.  #CLL-progressive/s/p multiple lines of therapy most recently on ibrutinib.  Currently off any therapy.  See discussion below  #Symptomatic severe anemia-multifactorial; suspect predominant CLL/SLL-July 2022- EGD shows multiple AV malformations stomach status post argon laser.  Hb 6.5.  #Abdominal pain chronic-s/p evaluation with pain clinic s/p pain pump; question adrenal insufficiency currently on hydrocortisone.  CT scan on admission no acute process.  # DNR/DNI  PLAN:   #Given the severe anemia-recommend 1 unit of PRBC transfusion.  Also will plan IV Venofer start tomorrow.  #I had a long discussion the patient and his son regarding the patient's poor prognosis given the progressive  malignancy/declining performance status-as manifested by multiple admission to hospital [3rd admissions in the last 1 month].  Discussed with the son the patient unfortunately does not have any reserves to withstand any subsequent lines of therapy.  #Introduced hospice philosophy the patient and son.  They are interested however no decisions are made.  Patient's son, [name] wants patient to be moved to [place] to live with him.  However [name] understands challenges/possible gaps in patient's care during transition at this phase of patient's illness.  [name] is inquired about hospice at the facility as patient cannot live by himself.  Patient does not have good social support at home.  Discussed with [employer]. On 8/22 we will revisit patient/son regarding the next step in management.   Thank you Dr.Girguis for allowing me to participate in the care of your pleasant patient. Please do not hesitate to contact me with questions or concerns in the interim.  Discussed with Dr.Girguis/care management team.      Cammie Sickle, MD 12/16/2020 11:54 PM

## 2020-12-17 DIAGNOSIS — R197 Diarrhea, unspecified: Secondary | ICD-10-CM

## 2020-12-17 DIAGNOSIS — R627 Adult failure to thrive: Secondary | ICD-10-CM | POA: Diagnosis not present

## 2020-12-17 DIAGNOSIS — A09 Infectious gastroenteritis and colitis, unspecified: Secondary | ICD-10-CM | POA: Diagnosis not present

## 2020-12-17 DIAGNOSIS — N179 Acute kidney failure, unspecified: Secondary | ICD-10-CM | POA: Diagnosis not present

## 2020-12-17 LAB — CBC WITH DIFFERENTIAL/PLATELET
Abs Immature Granulocytes: 0 10*3/uL (ref 0.00–0.07)
Basophils Absolute: 0 10*3/uL (ref 0.0–0.1)
Basophils Relative: 0 %
Eosinophils Absolute: 0 10*3/uL (ref 0.0–0.5)
Eosinophils Relative: 0 %
HCT: 22.8 % — ABNORMAL LOW (ref 39.0–52.0)
Hemoglobin: 7.7 g/dL — ABNORMAL LOW (ref 13.0–17.0)
Lymphocytes Relative: 48 %
Lymphs Abs: 4 10*3/uL (ref 0.7–4.0)
MCH: 33.3 pg (ref 26.0–34.0)
MCHC: 33.8 g/dL (ref 30.0–36.0)
MCV: 98.7 fL (ref 80.0–100.0)
Monocytes Absolute: 0.3 10*3/uL (ref 0.1–1.0)
Monocytes Relative: 3 %
Neutro Abs: 4.1 10*3/uL (ref 1.7–7.7)
Neutrophils Relative %: 49 %
Platelets: 122 10*3/uL — ABNORMAL LOW (ref 150–400)
RBC: 2.31 MIL/uL — ABNORMAL LOW (ref 4.22–5.81)
RDW: 21 % — ABNORMAL HIGH (ref 11.5–15.5)
Smear Review: NORMAL
WBC: 8.4 10*3/uL (ref 4.0–10.5)
nRBC: 0 % (ref 0.0–0.2)

## 2020-12-17 LAB — TYPE AND SCREEN
ABO/RH(D): A POS
Antibody Screen: NEGATIVE
Unit division: 0

## 2020-12-17 LAB — MAGNESIUM: Magnesium: 2 mg/dL (ref 1.7–2.4)

## 2020-12-17 LAB — GLUCOSE, CAPILLARY
Glucose-Capillary: 121 mg/dL — ABNORMAL HIGH (ref 70–99)
Glucose-Capillary: 175 mg/dL — ABNORMAL HIGH (ref 70–99)
Glucose-Capillary: 142 mg/dL — ABNORMAL HIGH (ref 70–99)
Glucose-Capillary: 162 mg/dL — ABNORMAL HIGH (ref 70–99)

## 2020-12-17 LAB — COMPREHENSIVE METABOLIC PANEL
ALT: 13 U/L (ref 0–44)
AST: 8 U/L — ABNORMAL LOW (ref 15–41)
Albumin: 3 g/dL — ABNORMAL LOW (ref 3.5–5.0)
Alkaline Phosphatase: 65 U/L (ref 38–126)
Anion gap: 5 (ref 5–15)
BUN: 21 mg/dL (ref 8–23)
CO2: 29 mmol/L (ref 22–32)
Calcium: 8.5 mg/dL — ABNORMAL LOW (ref 8.9–10.3)
Chloride: 103 mmol/L (ref 98–111)
Creatinine, Ser: 1.02 mg/dL (ref 0.61–1.24)
GFR, Estimated: >60 mL/min (ref >60)
Glucose, Bld: 130 mg/dL — ABNORMAL HIGH (ref 70–99)
Potassium: 4.1 mmol/L (ref 3.5–5.1)
Sodium: 137 mmol/L (ref 135–145)
Total Bilirubin: 1 mg/dL (ref 0.3–1.2)
Total Protein: 5.2 g/dL — ABNORMAL LOW (ref 6.5–8.1)

## 2020-12-17 LAB — BPAM RBC
Blood Product Expiration Date: 202208262359
ISSUE DATE / TIME: 202208201707
Unit Type and Rh: 600

## 2020-12-17 NOTE — Progress Notes (Signed)
James [name]   DOB:1950/06/18   TG#:[id number]    Subjective: Abdominal pain improved.  No nausea no vomiting.  Feels tired.  S/p unit of PRBC transfusion uneventful.  Objective:  Vitals:   12/04/20 1659 12/04/20 1946  BP:  (!) 156/78  Pulse: 69 65  Resp: 20 10  Temp: 97.6 F (36.4 C) 98.6 F (37 C)  SpO2: 100% 100%     Intake/Output Summary (Last 24 hours) at 12/04/2020 2325 Last data filed at 12/04/2020 2129 Gross per 24 hour  Intake 200 ml  Output 1700 ml  Net -1500 ml    Physical Exam Vitals and nursing note reviewed.  Constitutional:      Comments: Patient resting in the bed.    HENT:     Head: Normocephalic and atraumatic.     Mouth/Throat:     Mouth: Mucous membranes are moist.     Pharynx: No oropharyngeal exudate.  Eyes:     Pupils: Pupils are equal, round, and reactive to light.  Cardiovascular:     Rate and Rhythm: Normal rate and regular rhythm.  Pulmonary:     Effort: No respiratory distress.     Breath sounds: No wheezing.     Comments: Decreased breath sounds bilaterally at bases.  No wheeze or crackles Abdominal:     General: Bowel sounds are normal. There is no distension.     Palpations: Abdomen is soft. There is no mass.     Tenderness: There is no abdominal tenderness. There is no guarding or rebound.  Musculoskeletal:        General: No tenderness. Normal range of motion.     Cervical back: Normal range of motion and neck supple.  Skin:    General: Skin is warm.  Neurological:     Mental Status: He is alert and oriented to person, place, and time.  Psychiatric:        Mood and Affect: Affect normal.        Judgment: Judgment normal.       Labs:  Lab Results  Component Value Date   WBC 11.9 (H) 12/03/2020   HGB 8.4 (L) 12/03/2020   HCT 25.1 (L) 12/03/2020   MCV 98.0 12/03/2020   PLT 61 (L) 12/03/2020   NEUTROABS 7.3 12/02/2020    Lab Results  Component Value Date   NA 141 12/03/2020   K 3.8 12/03/2020   CL 107 12/03/2020    CO2 28 12/03/2020    Studies:  No results found.  CLL (chronic lymphocytic leukemia) ([place]) #70 year old male patient with a history of CLL/relapsed bone marrow/11 P deletion 7 q. Deletion/ chronic abdominal pain of unclear etiology-s/p pain pump; question adrenal insufficiency is currently admitted to hospital for worsening generalized weakness.  #Symptomatic severe anemia-multifactorial; suspect predominant CLL/SLL involving the bone marrow BX April 2022 bone marrow biopsy. July 2022- EGD shows multiple AV malformations stomach status post argon laser.  Hb 6.5.  S/p PRBC transfusion hemoglobin greater than 7.  Hold off IV iron as iron studies are adequate.  #Abdominal pain chronic-s/p evaluation with pain clinic s/p pain pump; question adrenal insufficiency currently on hydrocortisone.   # DNR/DNI  #I had a long discussion with the patient regarding the subsequent plan of care.  Patient wants to continue current scope of therapy including PRBC transfusion as needed/and also consider initiation of TKI like alcabrutinib if performance status improves.  Patient interested in moving to to [place] to live with his son, James [name].  We will await  palliative care evaluation in the morning tomorrow.

## 2020-12-17 NOTE — Assessment & Plan Note (Signed)
-   GI panel positive for Norovirus - cdiff negative - imodium PRN - supportive care and contact precautions

## 2020-12-17 NOTE — Assessment & Plan Note (Signed)
-   Pain pump in place in left lower abdomen.

## 2020-12-17 NOTE — Progress Notes (Signed)
Progress Note    FIN HUPP   DJS:[id number]  DOB: 11/25/1950  DOA: 12/15/2020     1  PCP: [name], MD  Initial CC: weakness  Hospital Course: [name] is a 70 year old male with PMH stage IV CLL, depression, DM II, hypertension, hyperlipidemia, GERD who presented to the hospital with generalized weakness.  He was recently hospitalized earlier in August for CAP. In general he has had an overall progressive decline with recurrent illnesses and having difficulty recovering from each per oncology. He has had worsening oral intake and associated lightheadedness/dizziness at home. He is admitted for further evaluation with oncology and evaluation by palliative care with consideration for pursuing hospice.  Interval History:  No events overnight.  ROS: Constitutional: negative for chills and fevers, Respiratory: negative for cough and sputum, Cardiovascular: negative for chest pain, and Gastrointestinal: negative for abdominal pain  Assessment & Plan: * Failure to thrive in adult - History of stage IV CLL (dx 2006) - overall decline recently and has had recurrent hospitalizations and has been referred to [facility] for chronic idiopathic abdominal pain.  He also has an intrathecal pain pump placed on 02/07/2020. -Evaluated by oncology on admission.  Recommendation is to consider involving hospice at this time given overall progressive decline - continue supportive care for now; appreciate palliative care involvement   AKI (acute kidney injury) ([place]) - baseline creatinine ~ 0.8 - 1 - patient presents with increase in creat >0.3 mg/dL above baseline, creat increase >1.5x baseline presumed to have occurred within past 7 days PTA -Creatinine 1.74 on admission - Etiology presumed prerenal/volume depletion due to history of poor oral intake prior to admission - Creatinine improving with fluids   Macrocytic anemia - MCV 103, Hgb 6.5 g/dL on admission - discussed with  oncology; will transfuse 1 unit PRBC on 8/21 -Iron stores adequate.  Holding off on iron repletion - Hemoglobin improved this morning, 7.7 g/dL (up from prior 6.5 g/dL) - check iron stores; if low will give IV iron as well   Adrenal insufficiency due to cancer therapy (HCC) - Continue hydrocortisone  Diarrhea - GI panel positive for Norovirus - cdiff negative - imodium PRN - supportive care and contact precautions   Diabetes mellitus type 2, uncomplicated (HCC) - Y8F 0.2% on 11/30/2020 - Continue SSI and CBG monitoring  Abdominal wall pain in epigastric region - Pain pump in place in left lower abdomen.  CLL (chronic lymphocytic leukemia) ([place]) - see failure to thrive    Old records reviewed in assessment of this patient  Antimicrobials:   DVT prophylaxis: SCD   Code Status:   Code Status: DNR Family Communication:   Disposition Plan: Status is: Inpatient  Remains inpatient appropriate because:Unsafe d/c plan and Inpatient level of care appropriate due to severity of illness  Dispo: The patient is from: Home              Anticipated d/c is to:  pending palliative care consult              Patient currently is not medically stable to d/c.   Difficult to place patient No  Risk of unplanned readmission score: Unplanned Admission- Pilot do not use: 36.56   Objective: Blood pressure 114/66, pulse 70, temperature 98.4 F (36.9 C), temperature source Oral, resp. rate 16, height 5\' 8"  (1.727 m), weight 50 kg, SpO2 97 %.  Examination: General appearance: cooperative, fatigued, no distress, and chronically ill appearing Head: Normocephalic, without obvious abnormality, atraumatic Eyes:  EOMI Lungs: clear to auscultation bilaterally Heart: regular rate and rhythm and S1, S2 normal Abdomen: soft, NT, ND, BS present Extremities:  no edema Skin:  senile purpura Neurologic: Grossly normal  Consultants:    Procedures:    Data Reviewed: I have personally reviewed  following labs and imaging studies Results for orders placed or performed during the hospital encounter of 12/15/20 (from the past 24 hour(s))  Glucose, capillary     Status: Abnormal   Collection Time: 12/16/20  4:18 PM  Result Value Ref Range   Glucose-Capillary 164 (H) 70 - 99 mg/dL  Glucose, capillary     Status: Abnormal   Collection Time: 12/16/20 10:28 PM  Result Value Ref Range   Glucose-Capillary 102 (H) 70 - 99 mg/dL  CBC with Differential/Platelet     Status: Abnormal   Collection Time: 12/17/20  5:14 AM  Result Value Ref Range   WBC 8.4 4.0 - 10.5 K/uL   RBC 2.31 (L) 4.22 - 5.81 MIL/uL   Hemoglobin 7.7 (L) 13.0 - 17.0 g/dL   HCT 22.8 (L) 39.0 - 52.0 %   MCV 98.7 80.0 - 100.0 fL   MCH 33.3 26.0 - 34.0 pg   MCHC 33.8 30.0 - 36.0 g/dL   RDW 21.0 (H) 11.5 - 15.5 %   Platelets 122 (L) 150 - 400 K/uL   nRBC 0.0 0.0 - 0.2 %   Neutrophils Relative % 49 %   Neutro Abs 4.1 1.7 - 7.7 K/uL   Lymphocytes Relative 48 %   Lymphs Abs 4.0 0.7 - 4.0 K/uL   Monocytes Relative 3 %   Monocytes Absolute 0.3 0.1 - 1.0 K/uL   Eosinophils Relative 0 %   Eosinophils Absolute 0.0 0.0 - 0.5 K/uL   Basophils Relative 0 %   Basophils Absolute 0.0 0.0 - 0.1 K/uL   WBC Morphology MORPHOLOGY UNREMARKABLE    RBC Morphology MIXED RBC POPULATION    Smear Review Normal platelet morphology    Abs Immature Granulocytes 0.00 0.00 - 0.07 K/uL  Magnesium     Status: None   Collection Time: 12/17/20  5:14 AM  Result Value Ref Range   Magnesium 2.0 1.7 - 2.4 mg/dL  Comprehensive metabolic panel     Status: Abnormal   Collection Time: 12/17/20  5:14 AM  Result Value Ref Range   Sodium 137 135 - 145 mmol/L   Potassium 4.1 3.5 - 5.1 mmol/L   Chloride 103 98 - 111 mmol/L   CO2 29 22 - 32 mmol/L   Glucose, Bld 130 (H) 70 - 99 mg/dL   BUN 21 8 - 23 mg/dL   Creatinine, Ser 1.02 0.61 - 1.24 mg/dL   Calcium 8.5 (L) 8.9 - 10.3 mg/dL   Total Protein 5.2 (L) 6.5 - 8.1 g/dL   Albumin 3.0 (L) 3.5 - 5.0 g/dL    AST 8 (L) 15 - 41 U/L   ALT 13 0 - 44 U/L   Alkaline Phosphatase 65 38 - 126 U/L   Total Bilirubin 1.0 0.3 - 1.2 mg/dL   GFR, Estimated >60 >60 mL/min   Anion gap 5 5 - 15  Glucose, capillary     Status: Abnormal   Collection Time: 12/17/20  7:44 AM  Result Value Ref Range   Glucose-Capillary 121 (H) 70 - 99 mg/dL  Glucose, capillary     Status: Abnormal   Collection Time: 12/17/20 12:10 PM  Result Value Ref Range   Glucose-Capillary 175 (H) 70 - 99 mg/dL  Recent Results (from the past 240 hour(s))  Blood culture (single)     Status: None (Preliminary result)   Collection Time: 12/15/20 10:18 AM   Specimen: BLOOD  Result Value Ref Range Status   Specimen Description BLOOD BLOOD LEFT FOREARM  Final   Special Requests   Final    BOTTLES DRAWN AEROBIC AND ANAEROBIC Blood Culture results may not be optimal due to an inadequate volume of blood received in culture bottles   Culture   Final    NO GROWTH 2 DAYS Performed at [facility], [address], [place], [place] [address]    Report Status PENDING  Incomplete  Resp Panel by RT-PCR (Flu A&B, Covid) Nasopharyngeal Swab     Status: None   Collection Time: 12/15/20 12:04 PM   Specimen: Nasopharyngeal Swab; Nasopharyngeal(NP) swabs in vial transport medium  Result Value Ref Range Status   SARS Coronavirus 2 by RT PCR NEGATIVE NEGATIVE Final    Comment: (NOTE) SARS-CoV-2 target nucleic acids are NOT DETECTED.  The SARS-CoV-2 RNA is generally detectable in upper respiratory specimens during the acute phase of infection. The lowest concentration of SARS-CoV-2 viral copies this assay can detect is 138 copies/mL. A negative result does not preclude SARS-Cov-2 infection and should not be used as the sole basis for treatment or other patient management decisions. A negative result may occur with  improper specimen collection/handling, submission of specimen other than nasopharyngeal swab, presence of viral mutation(s)  within the areas targeted by this assay, and inadequate number of viral copies(<138 copies/mL). A negative result must be combined with clinical observations, patient history, and epidemiological information. The expected result is Negative.  Fact Sheet for Patients:  EntrepreneurPulse.com.au  Fact Sheet for Healthcare Providers:  IncredibleEmployment.be  This test is no t yet approved or cleared by the [place] FDA and  has been authorized for detection and/or diagnosis of SARS-CoV-2 by FDA under an Emergency Use Authorization (EUA). This EUA will remain  in effect (meaning this test can be used) for the duration of the COVID-19 declaration under Section 564(b)(1) of the Act, 21 U.S.C.section 360bbb-3(b)(1), unless the authorization is terminated  or revoked sooner.       Influenza A by PCR NEGATIVE NEGATIVE Final   Influenza B by PCR NEGATIVE NEGATIVE Final    Comment: (NOTE) The Xpert Xpress SARS-CoV-2/FLU/RSV plus assay is intended as an aid in the diagnosis of influenza from Nasopharyngeal swab specimens and should not be used as a sole basis for treatment. Nasal washings and aspirates are unacceptable for Xpert Xpress SARS-CoV-2/FLU/RSV testing.  Fact Sheet for Patients: EntrepreneurPulse.com.au  Fact Sheet for Healthcare Providers: IncredibleEmployment.be  This test is not yet approved or cleared by the [place] FDA and has been authorized for detection and/or diagnosis of SARS-CoV-2 by FDA under an Emergency Use Authorization (EUA). This EUA will remain in effect (meaning this test can be used) for the duration of the COVID-19 declaration under Section 564(b)(1) of the Act, 21 U.S.C. section 360bbb-3(b)(1), unless the authorization is terminated or revoked.  Performed at [facility], [place], [place] [address]   C Difficile Quick Screen w PCR reflex      Status: None   Collection Time: 12/16/20 10:15 AM   Specimen: STOOL  Result Value Ref Range Status   C Diff antigen NEGATIVE NEGATIVE Final   C Diff toxin NEGATIVE NEGATIVE Final   C Diff interpretation No C. difficile detected.  Final    Comment: Performed  at [facility], [address] [place], [place] [address]  Gastrointestinal Panel by PCR , Stool     Status: Abnormal   Collection Time: 12/16/20 10:15 AM   Specimen: Stool  Result Value Ref Range Status   Campylobacter species NOT DETECTED NOT DETECTED Final   Plesimonas shigelloides NOT DETECTED NOT DETECTED Final   Salmonella species NOT DETECTED NOT DETECTED Final   Yersinia enterocolitica NOT DETECTED NOT DETECTED Final   Vibrio species NOT DETECTED NOT DETECTED Final   Vibrio cholerae NOT DETECTED NOT DETECTED Final   Enteroaggregative E coli (EAEC) NOT DETECTED NOT DETECTED Final   Enteropathogenic E coli (EPEC) NOT DETECTED NOT DETECTED Final   Enterotoxigenic E coli (ETEC) NOT DETECTED NOT DETECTED Final   Shiga like toxin producing E coli (STEC) NOT DETECTED NOT DETECTED Final   Shigella/Enteroinvasive E coli (EIEC) NOT DETECTED NOT DETECTED Final   Cryptosporidium NOT DETECTED NOT DETECTED Final   Cyclospora cayetanensis NOT DETECTED NOT DETECTED Final   Entamoeba histolytica NOT DETECTED NOT DETECTED Final   Giardia lamblia NOT DETECTED NOT DETECTED Final   Adenovirus F40/41 NOT DETECTED NOT DETECTED Final   Astrovirus NOT DETECTED NOT DETECTED Final   Norovirus GI/GII DETECTED (A) NOT DETECTED Final    Comment: RESULT CALLED TO, READ BACK BY AND VERIFIED WITH: JOY BEEMAN AT 1823 12/16/20.PMF    Rotavirus A NOT DETECTED NOT DETECTED Final   Sapovirus (I, II, IV, and V) NOT DETECTED NOT DETECTED Final    Comment: Performed at [facility], [address], [place], [place] [address]     Radiology Studies: No results found. DG Chest Port 1 View  Final Result      Scheduled Meds:  sodium  chloride   Intravenous Once   feeding supplement  237 mL Oral TID BM   folic acid  1 mg Oral Daily   gabapentin  300 mg Oral BID   hydrocortisone  20 mg Oral QPM   hydrocortisone  30 mg Oral Daily   insulin aspart  0-5 Units Subcutaneous QHS   insulin aspart  0-6 Units Subcutaneous TID WC   multivitamin with minerals  1 tablet Oral Daily   naloxegol oxalate  25 mg Oral Daily   pantoprazole  40 mg Oral QHS   PRN Meds: acetaminophen **OR** acetaminophen, loperamide, ondansetron **OR** ondansetron (ZOFRAN) IV, oxyCODONE Continuous Infusions:  lactated ringers 100 mL/hr at 12/16/20 2133     LOS: 1 day  Time spent: Greater than 50% of the 35 minute visit was spent in counseling/coordination of care for the patient as laid out in the A&P.   Dwyane Dee, MD [facility] 12/17/2020, 3:27 PM

## 2020-12-18 DIAGNOSIS — R627 Adult failure to thrive: Secondary | ICD-10-CM | POA: Diagnosis not present

## 2020-12-18 DIAGNOSIS — Z515 Encounter for palliative care: Secondary | ICD-10-CM | POA: Diagnosis not present

## 2020-12-18 LAB — CBC WITH DIFFERENTIAL/PLATELET
Abs Immature Granulocytes: 0.02 10*3/uL (ref 0.00–0.07)
Basophils Absolute: 0.1 10*3/uL (ref 0.0–0.1)
Basophils Relative: 1 %
Eosinophils Absolute: 0 10*3/uL (ref 0.0–0.5)
Eosinophils Relative: 0 %
HCT: 23.5 % — ABNORMAL LOW (ref 39.0–52.0)
Hemoglobin: 7.8 g/dL — ABNORMAL LOW (ref 13.0–17.0)
Immature Granulocytes: 0 %
Lymphocytes Relative: 69 %
Lymphs Abs: 5.7 10*3/uL — ABNORMAL HIGH (ref 0.7–4.0)
MCH: 32.8 pg (ref 26.0–34.0)
MCHC: 33.2 g/dL (ref 30.0–36.0)
MCV: 98.7 fL (ref 80.0–100.0)
Monocytes Absolute: 0.6 10*3/uL (ref 0.1–1.0)
Monocytes Relative: 7 %
Neutro Abs: 1.9 10*3/uL (ref 1.7–7.7)
Neutrophils Relative %: 23 %
Platelets: 111 10*3/uL — ABNORMAL LOW (ref 150–400)
RBC: 2.38 MIL/uL — ABNORMAL LOW (ref 4.22–5.81)
RDW: 20.3 % — ABNORMAL HIGH (ref 11.5–15.5)
Smear Review: NORMAL
WBC: 8.3 10*3/uL (ref 4.0–10.5)
nRBC: 0 % (ref 0.0–0.2)

## 2020-12-18 LAB — COMPREHENSIVE METABOLIC PANEL
ALT: 11 U/L (ref 0–44)
AST: 10 U/L — ABNORMAL LOW (ref 15–41)
Albumin: 2.9 g/dL — ABNORMAL LOW (ref 3.5–5.0)
Alkaline Phosphatase: 59 U/L (ref 38–126)
Anion gap: 3 — ABNORMAL LOW (ref 5–15)
BUN: 19 mg/dL (ref 8–23)
CO2: 31 mmol/L (ref 22–32)
Calcium: 8.4 mg/dL — ABNORMAL LOW (ref 8.9–10.3)
Chloride: 104 mmol/L (ref 98–111)
Creatinine, Ser: 1.05 mg/dL (ref 0.61–1.24)
GFR, Estimated: >60 mL/min (ref >60)
Glucose, Bld: 133 mg/dL — ABNORMAL HIGH (ref 70–99)
Potassium: 4.2 mmol/L (ref 3.5–5.1)
Sodium: 138 mmol/L (ref 135–145)
Total Bilirubin: 0.4 mg/dL (ref 0.3–1.2)
Total Protein: 5 g/dL — ABNORMAL LOW (ref 6.5–8.1)

## 2020-12-18 LAB — GLUCOSE, CAPILLARY
Glucose-Capillary: 133 mg/dL — ABNORMAL HIGH (ref 70–99)
Glucose-Capillary: 156 mg/dL — ABNORMAL HIGH (ref 70–99)
Glucose-Capillary: 163 mg/dL — ABNORMAL HIGH (ref 70–99)
Glucose-Capillary: 148 mg/dL — ABNORMAL HIGH (ref 70–99)

## 2020-12-18 LAB — MAGNESIUM: Magnesium: 1.9 mg/dL (ref 1.7–2.4)

## 2020-12-18 NOTE — Progress Notes (Signed)
[place]  [phone] [phone]   Name: James [name] Date: 12/18/2020 MRN: [id number]  DOB: 1951-03-09  Patient Care Team: [name], MD as PCP - General (Internal Medicine) Cammie Sickle, MD as Consulting Physician (Hematology and Oncology) Jonell Krontz, Kirt Boys, NP as Nurse Practitioner (Hospice and Palliative Medicine) Verlon Au, NP as Nurse Practitioner (Hematology and Oncology) Jacquelin Hawking, NP as Nurse Practitioner (Oncology) Lonell Face, NP as Nurse Practitioner (Neurosurgery) Milinda Pointer, MD as Consulting Physician (Pain Medicine)    REASON FOR CONSULTATION: James [name] is a 70 y.o. male with multiple medical problems including stage IV CLL (initially diagnosed in 2006) most recently treated with obinutuzumab.  Patient has had severe and chronic abdominal pain of unclear etiology.  He has had extensive work-up including referral to [facility].  He has been followed by interventional pain management and is status post intrathecal pain pump (placed 02/07/2020).  Patient was hospitalized 11/21/2020 to 11/25/2020 with symptomatic anemia thought secondary to slow GI bleed with AVM noted on EGD.  He was again hospitalized 11/30/2020 to 12/05/2020 with sepsis from HCAP.  Patient is now readmitted 12/15/2020 with syncope and AKI.  He was referred to palliative care to help address goals and manage ongoing symptoms.  CODE STATUS: DNR/DNI (MOST form completed on 07/08/19  PAST MEDICAL HISTORY: Past Medical History:  Diagnosis Date   CLL (chronic lymphocytic leukemia) ([place])    Constipation    Depression    Diabetes mellitus without complication (HCC)    GERD (gastroesophageal reflux disease)    Hematuria    Hyperlipidemia    Hypertension    Lactic acidosis 12/15/2020   Therapeutic opioid induced constipation     PAST SURGICAL HISTORY:  Past Surgical History:  Procedure Laterality  Date   CATARACT EXTRACTION W/ INTRAOCULAR LENS IMPLANT Bilateral    CHOLECYSTECTOMY  1983   COLONOSCOPY WITH PROPOFOL N/A 10/27/2017   Procedure: COLONOSCOPY WITH PROPOFOL;  Surgeon: Manya Silvas, MD;  Location: [facility] ENDOSCOPY;  Service: Endoscopy;  Laterality: N/A;   ESOPHAGOGASTRODUODENOSCOPY (EGD) WITH PROPOFOL N/A 11/20/2016   Procedure: ESOPHAGOGASTRODUODENOSCOPY (EGD) WITH PROPOFOL;  Surgeon: Manya Silvas, MD;  Location[facility] ENDOSCOPY;  Service: Endoscopy;  Laterality: N/A;   ESOPHAGOGASTRODUODENOSCOPY (EGD) WITH PROPOFOL N/A 10/27/2017   Procedure: ESOPHAGOGASTRODUODENOSCOPY (EGD) WITH PROPOFOL;  Surgeon: Manya Silvas, MD;  Location: [facility] ENDOSCOPY;  Service: Endoscopy;  Laterality: N/A;   ESOPHAGOGASTRODUODENOSCOPY (EGD) WITH PROPOFOL N/A 11/23/2020   Procedure: ESOPHAGOGASTRODUODENOSCOPY (EGD) WITH PROPOFOL;  Surgeon: Jonathon Bellows, MD;  Location: [facility] ENDOSCOPY;  Service: Gastroenterology;  Laterality: N/A;   INTRATHECAL PUMP IMPLANT Left 02/07/2020   Procedure: INTRATHECAL PUMP & CATHETER IMPLANT;  Surgeon: Deetta Perla, MD;  Location: [facility] ORS;  Service: Neurosurgery;  Laterality: Left;   LOWER EXTREMITY ANGIOGRAPHY Right 05/19/2017   Procedure: LOWER EXTREMITY ANGIOGRAPHY;  Surgeon: Algernon Huxley, MD;  Location: [place] CV LAB;  Service: Cardiovascular;  Laterality: Right;   LOWER EXTREMITY ANGIOGRAPHY Left 08/10/2018   Procedure: LOWER EXTREMITY ANGIOGRAPHY;  Surgeon: Algernon Huxley, MD;  Location: [place] CV LAB;  Service: Cardiovascular;  Laterality: Left;    HEMATOLOGY/ONCOLOGY HISTORY:  Oncology History Overview Note  # 2006- CLL STAGE IV; MAY 2011- WBC- 57K;Platelets-99;Hb-12/CT Bulky LN; BMBx- 80% Invol; del 11; START Benda-Ritux x4 [finished Sep 2011];   # July 2015-Progression; Sep 2015-START ibrutinib; CT scan DEC 2015- Improvement LN; Cont Ibrutinib 176m/d; NOV 2016 CT- 1-2CM LN [mild progression compared to  Dec 2015];NOV 2016- FISH peripheral blood- NO  MUTATIONS/CD-38 Positive; NOV 7th- CONT IBRUTINIB 2 pills/day; CT AUG 2017- STABLE;  DEC 6th PET- Mild RP LN/ Retrocrural LN  # OFF ibrutinib [? intol]- sep 2019- Jan 16th 2020; Re-start Ibrutinib; September 2020-stop ibrutinib [poor tolerance/worsening anemia]  # Jan 16th 2020- start aranesp; HOLD while on [place].   # MARCH 11th 2021- Gazyva x 6 finished AUG 2021.   #April May 2022-worsening anemia- BMBx- CLL; cytogenetics: 20 q del [incidental]; rFISH- 17p/11q DEL  # Early May 2022- START IBRUTINIB 420 mg/day  # SEP 2021- prostate enlargement on CT scan- UA-NEG; PSA- WNL; s/p Dr.Wolff.    # DEC 2019- PAIN CONTRACT  # PALLIATIVE CARE- 06/21/2019-  # October 2019-bone marrow biopsy [worsening anemia]-question dyserythropoietic changes/small clone of CLL;  # [facility] HEM- NEG.  SA skin infection [Oct 2016] s/p clinda -------------------------------------------------------    DIAGNOSIS: CLL  STAGE:   IV      ;GOALS: palliative  CURRENT/MOST RECENT THERAPY : Ibrutinib    CLL (chronic lymphocytic leukemia) ([place])  07/08/2019 -  Chemotherapy   The patient had obinutuzumab (GAZYVA) 100 mg in sodium chloride 0.9 % 100 mL (0.9615 mg/mL) chemo infusion, 100 mg, Intravenous, Once, 6 of 6 cycles Administration: 100 mg (07/08/2019), 900 mg (07/09/2019), 1,000 mg (07/26/2019), 1,000 mg (08/16/2019), 1,000 mg (08/02/2019), 1,000 mg (09/13/2019), 1,000 mg (10/11/2019), 1,000 mg (11/09/2019), 1,000 mg (12/07/2019)  for chemotherapy treatment.      ALLERGIES:  has No Known Allergies.  MEDICATIONS:  Current Facility-Administered Medications  Medication Dose Route Frequency Provider Last Rate Last Admin   0.9 %  sodium chloride infusion (Manually program via Guardrails IV Fluids)   Intravenous Once Dwyane Dee, MD       acetaminophen (TYLENOL) tablet 650 mg  650 mg Oral Q6H PRN Agbata, Tochukwu, MD       Or   acetaminophen (TYLENOL) suppository 650 mg  650 mg Rectal Q6H PRN Agbata, Tochukwu,  MD       feeding supplement (ENSURE ENLIVE / ENSURE PLUS) liquid 237 mL  237 mL Oral TID BM Agbata, Tochukwu, MD   237 mL at 16/10/96 0454   folic acid (FOLVITE) tablet 1 mg  1 mg Oral Daily Agbata, Tochukwu, MD   1 mg at 12/18/20 1011   gabapentin (NEURONTIN) capsule 300 mg  300 mg Oral BID Agbata, Tochukwu, MD   300 mg at 12/18/20 1011   hydrocortisone (CORTEF) tablet 20 mg  20 mg Oral QPM Agbata, Tochukwu, MD   20 mg at 12/17/20 1834   hydrocortisone (CORTEF) tablet 30 mg  30 mg Oral Daily Agbata, Tochukwu, MD   30 mg at 12/18/20 1012   insulin aspart (novoLOG) injection 0-5 Units  0-5 Units Subcutaneous QHS Dwyane Dee, MD       insulin aspart (novoLOG) injection 0-6 Units  0-6 Units Subcutaneous TID WC Dwyane Dee, MD   1 Units at 12/17/20 1338   lactated ringers infusion   Intravenous Continuous Dwyane Dee, MD 75 mL/hr at 12/18/20 1017 Infusion Verify at 12/18/20 1017   loperamide (IMODIUM) capsule 2 mg  2 mg Oral Q4H PRN Dwyane Dee, MD       multivitamin with minerals tablet 1 tablet  1 tablet Oral Daily Agbata, Tochukwu, MD   1 tablet at 12/18/20 1011   naloxegol oxalate (MOVANTIK) tablet 25 mg  25 mg Oral Daily Agbata, Tochukwu, MD   25 mg at 12/18/20 1011   ondansetron (ZOFRAN) tablet 4 mg  4 mg Oral Q6H PRN Agbata, Tochukwu, MD       Or   ondansetron (ZOFRAN) injection 4 mg  4 mg Intravenous Q6H PRN Agbata, Tochukwu, MD       oxyCODONE (Oxy IR/ROXICODONE) immediate release tablet 10 mg  10 mg Oral Q6H PRN Athena Masse, MD   10 mg at 12/18/20 1015   pantoprazole (PROTONIX) EC tablet 40 mg  40 mg Oral QHS Agbata, Tochukwu, MD   40 mg at 12/17/20 2147    VITAL SIGNS: BP 115/67 (BP Location: Left Arm)   Pulse 67   Temp 98.2 F (36.8 C) (Oral)   Resp 18   Ht _0  (1.727 m)   Wt 110 lb 3.7 oz (50 kg)   SpO2 99%   BMI 16.76 kg/m  Filed Weights   12/15/20 1450  Weight: 110 lb 3.7 oz (50 kg)    Estimated body mass index is 16.76 kg/m as calculated from the  following:   Height as of this encounter: _1  (1.727 m).   Weight as of this encounter: 110 lb 3.7 oz (50 kg).  LABS: CBC:    Component Value Date/Time   WBC 8.3 12/18/2020 0532   HGB 7.8 (L) 12/18/2020 0532   HGB 11.4 (L) 08/09/2014 1122   HCT 23.5 (L) 12/18/2020 0532   HCT 35.6 (L) 08/09/2014 1122   PLT 111 (L) 12/18/2020 0532   PLT 95 (L) 08/09/2014 1122   MCV 98.7 12/18/2020 0532   MCV 100 08/09/2014 1122   NEUTROABS 1.9 12/18/2020 0532   NEUTROABS 4.3 08/09/2014 1122   LYMPHSABS 5.7 (H) 12/18/2020 0532   LYMPHSABS 6.5 (H) 08/09/2014 1122   MONOABS 0.6 12/18/2020 0532   MONOABS 1.1 (H) 08/09/2014 1122   EOSABS 0.0 12/18/2020 0532   EOSABS 0.1 08/09/2014 1122   BASOSABS 0.1 12/18/2020 0532   BASOSABS 0.1 08/09/2014 1122   Comprehensive Metabolic Panel:    Component Value Date/Time   NA 138 12/18/2020 0532   NA 142 05/03/2014 1139   K 4.2 12/18/2020 0532   K 4.7 05/03/2014 1139   CL 104 12/18/2020 0532   CL 110 (H) 05/03/2014 1139   CO2 31 12/18/2020 0532   CO2 24 05/03/2014 1139   BUN 19 12/18/2020 0532   BUN 20 (H) 05/03/2014 1139   CREATININE 1.05 12/18/2020 0532   CREATININE 1.22 08/09/2014 1122   GLUCOSE 133 (H) 12/18/2020 0532   GLUCOSE 98 05/03/2014 1139   CALCIUM 8.4 (L) 12/18/2020 0532   CALCIUM 8.6 05/03/2014 1139   AST 10 (L) 12/18/2020 0532   AST 15 05/03/2014 1139   ALT 11 12/18/2020 0532   ALT 26 05/03/2014 1139   ALKPHOS 59 12/18/2020 0532   ALKPHOS 94 05/03/2014 1139   BILITOT 0.4 12/18/2020 0532   BILITOT 0.5 05/03/2014 1139   PROT 5.0 (L) 12/18/2020 0532   PROT 7.5 05/03/2014 1139   ALBUMIN 2.9 (L) 12/18/2020 0532   ALBUMIN 4.1 05/03/2014 1139    RADIOGRAPHIC STUDIES: CT ABDOMEN PELVIS W CONTRAST  Result Date: 11/30/2020 CLINICAL DATA:  Epigastric pain for 5 days.  Generalized weakness EXAM: CT ABDOMEN AND PELVIS WITH CONTRAST TECHNIQUE: Multidetector CT imaging of the abdomen and pelvis was performed using the standard protocol  following bolus administration of intravenous contrast. CONTRAST:  22m OMNIPAQUE IOHEXOL 350 MG/ML SOLN COMPARISON:  11/21/2020 FINDINGS: Lower chest: New mixed density airspace opacity within the left lower lobe. Right lung bases clear. Heart size is normal. Hepatobiliary: Status post cholecystectomy.  Similar degree of intrahepatic biliary dilatation. No focal liver lesion is identified. Pancreas: Unremarkable. No pancreatic ductal dilatation or surrounding inflammatory changes. Spleen: Spleen is upper limits of normal in size without focal lesion. Adrenals/Urinary Tract: Unremarkable adrenal glands. Stable bilateral renal cysts. Two tiny right-sided renal stones without hydronephrosis. No left-sided renal stone or hydronephrosis. Urinary bladder is mildly distended but appears otherwise unremarkable. Stomach/Bowel: Stomach is within normal limits. Appendix appears normal. No evidence of bowel wall thickening, distention, or inflammatory changes. Vascular/Lymphatic: Advanced atherosclerosis throughout the aortoiliac axis with bi-iliac stents. No aneurysm. Similar degree of retroperitoneal lymphadenopathy. Reference left para-aortic nodal mass measuring approximately 4.3 x 2.4 cm (series 3, image 36), unchanged. Reproductive: Prostatomegaly. Other: No free fluid. No abdominopelvic fluid collection. No pneumoperitoneum. No abdominal wall hernia. Musculoskeletal: No acute or significant osseous findings. Neurostimulator is again seen entering at the L3-4 level and extending into the thoracic region, beyond the field of view. IMPRESSION: 1. New airspace opacity within the left lower lobe compatible with pneumonia. 2. Stable retroperitoneal lymphadenopathy. 3. Nonobstructing right nephrolithiasis. 4. Prostatomegaly. 5. Aortic atherosclerosis (ICD10-I70.0). Electronically Signed   By: Davina Poke D.O.   On: 11/30/2020 15:31   CT ABDOMEN PELVIS W CONTRAST  Result Date: 11/21/2020 CLINICAL DATA:  Acute  nonlocalized epigastric pain. Weakness and lethargy. Question perforation. Being treated for leukemia. EXAM: CT ABDOMEN AND PELVIS WITH CONTRAST TECHNIQUE: Multidetector CT imaging of the abdomen and pelvis was performed using the standard protocol following bolus administration of intravenous contrast. CONTRAST:  35m OMNIPAQUE IOHEXOL 350 MG/ML SOLN COMPARISON:  01/24/2020 FINDINGS: Lower chest: Lung bases are clear.  No pleural or pericardial fluid. Hepatobiliary: Previous cholecystectomy. No significant liver parenchymal finding. Mild ductal prominence, within normal limits following cholecystectomy. Pancreas: Normal Spleen: Spleen upper limits of normal in size.  No focal lesion. Adrenals/Urinary Tract: Adrenal glands are normal. There are bilateral renal cysts. No evidence of mass or hydronephrosis. One or 2 punctate nonobstructing stones in the lower pole of the right kidney. Stomach/Bowel: Stomach is normal. Small bowel is normal. Moderate amount of gas and fecal matter in the colon without evidence of inflammatory disease or frank obstruction. Normal appearing appendix. Vascular/Lymphatic: Aortic atherosclerosis. No aneurysm. IVC is normal. Retroperitoneal adenopathy is similar to the previous study, the largest nodal mass on the left at the level of the kidney measuring 4.2 x 2.8 cm. Other retroperitoneal nodes similarly stable. Reproductive: Enlarged prostate. Other: No free fluid or air. Musculoskeletal: No fracture or focal bone lesion. Neurostimulator in place, entering at the L3-4 level and extending into the thoracic region. IMPRESSION: No evidence of acute bowel pathology. No evidence of perforation as questioned clinically. Moderate amount of gas and fecal matter in the colon, possibly due to opioid use. Spleen upper limits of normal in size. Retroperitoneal lymphadenopathy, similar to the study of September 2021. Enlarged prostate. Aortic Atherosclerosis (ICD10-I70.0). Electronically Signed   By:  MNelson ChimesM.D.   On: 11/21/2020 15:26   DG Chest Port 1 View  Result Date: 12/15/2020 CLINICAL DATA:  Follow-up pneumonia.  Syncope.  History of leukemia EXAM: PORTABLE CHEST 1 VIEW COMPARISON:  11/30/2020 FINDINGS: Normal heart size. Atherosclerotic calcification of the aortic knob. Interval resolution of previously seen left lower lobe pneumonia. No focal airspace consolidation, pleural effusion, or pneumothorax. IMPRESSION: No acute cardiopulmonary abnormality. Electronically Signed   By: NDavina PokeD.O.   On: 12/15/2020 11:24   DG Chest Port 1 View  Result Date: 11/30/2020 CLINICAL DATA:  Questionable sepsis. EXAM: PORTABLE CHEST  1 VIEW COMPARISON:  Chest x-ray 11/21/2020, CT chest 01/24/2020 FINDINGS: The heart size and mediastinal contours are unchanged. Aortic calcification. Interval development of patchy airspace opacity of the left lower lobe. No pulmonary edema. No pleural effusion. No pneumothorax. No acute osseous abnormality. Degenerative changes of bilateral acromioclavicular joints. IMPRESSION: Left lower lobe pneumonia/aspiration pneumonia Followup PA and lateral chest X-ray is recommended in 3-4 weeks following therapy to ensure resolution and exclude underlying malignancy. Electronically Signed   By: Iven Finn M.D.   On: 11/30/2020 15:34   DG Chest Port 1 View  Result Date: 11/21/2020 CLINICAL DATA:  Questionable sepsis. Weakness. Lethargy. Being treated for leukemia. EXAM: PORTABLE CHEST 1 VIEW COMPARISON:  09/12/2019 FINDINGS: The heart size and mediastinal contours are within normal limits. Ordinary age related aortic atherosclerotic calcification. Both lungs are clear. The visualized skeletal structures are unremarkable. IMPRESSION: No active disease. Electronically Signed   By: Nelson Chimes M.D.   On: 11/21/2020 15:20    PERFORMANCE STATUS (ECOG) : 2 - Symptomatic, <50% confined to bed  Review of Systems Unless otherwise noted, a complete review of systems is  negative.  Physical Exam General: NAD Cardiovascular: regular rate and rhythm Pulmonary: Unlabored Extremities: no edema, no joint deformities Skin: no rashes Neurological: Weakness but otherwise nonfocal  IMPRESSION: Follow-up visit.  Weekend notes reviewed.  Patient denies significant symptomatic burden today.  Again discussed general goals of treatment.  Patient verbalized feeling that more treatment would be unlikely to do much good.  We again discussed the option of hospice services.  Patient is undecided.  He does say that he intends to move with his son in [place] but is unclear of the timing of this.  Patient thinks that he may return home first and then move in with his son after a week or two.  I called his son who was in a meeting.  Son says that he will call me back later today.  PLAN: -Continue current scope of treatment -Dispo unclear.  Will speak to family  Case and plan discussed with Dr. Rogue Bussing  Time Total: 25 minutes  Visit consisted of counseling and education dealing with the complex and emotionally intense issues of symptom management and palliative care in the setting of serious and potentially life-threatening illness.Greater than 50%  of this time was spent counseling and coordinating care related to the above assessment and plan.  Signed by: Altha Harm, PhD, NP-C

## 2020-12-18 NOTE — TOC Progression Note (Signed)
Transition of Care [facility]) - Progression Note    Patient Details  Name: James [name] MRN: [id number] Date of Birth: 1950/09/17  Transition of Care [facility]) CM/SW [place], RN Phone Number: 12/18/2020, 1:59 PM  Clinical Narrative:   Palliative consult on chart, son is considering taking patient home with him, but he would like to talk to [facility] from Palliative prior to making any final decisions.  Josh notified by RNDM.  Corinne Ports was at bedside and wanted to know the disposition, as he cares for patient at home.  Information was relayed that son will speak to care team and reach out to [place].  As per [name], patient's son, it is acceptable to discuss patient's condition with Gerald Stabs as well as he is patient's caregiver.  [name] states he will contact RNCM after speaking with Palliative.  TOC contact information given, TOC to follow to discharge.         Expected Discharge Plan and Services                                                 Social Determinants of Health (SDOH) Interventions    Readmission Risk Interventions Readmission Risk Prevention Plan 12/01/2020  Transportation Screening Complete  Medication Review ([place]) Complete  PCP or Specialist appointment within 3-5 days of discharge Complete  HRI or [place] Complete  SW Recovery Care/Counseling Consult Complete  [place] Not Applicable  Some recent data might be hidden

## 2020-12-18 NOTE — Progress Notes (Signed)
Progress Note    James [name]   YJE:[id number]  DOB: 04-15-1951  DOA: 12/15/2020     2  PCP: [name], MD  Initial CC: weakness  Hospital Course: James [name] is a 70 year old male with PMH stage IV CLL, depression, DM II, hypertension, hyperlipidemia, GERD who presented to the hospital with generalized weakness.  He was recently hospitalized earlier in August for CAP. In general he has had an overall progressive decline with recurrent illnesses and having difficulty recovering from each per oncology. He has had worsening oral intake and associated lightheadedness/dizziness at home. He is admitted for further evaluation with oncology and evaluation by palliative care with consideration for pursuing hospice.  Interval History:  No events overnight.  States that the frequency of his diarrhea has been improving some since yesterday.  Otherwise feels about the same; does endorse that his energy feels a little better after having received blood on admission.  ROS: Constitutional: negative for chills and fevers, Respiratory: negative for cough and sputum, Cardiovascular: negative for chest pain, and Gastrointestinal: negative for abdominal pain  Assessment & Plan: * Failure to thrive in adult - History of stage IV CLL (dx 2006) - overall decline recently and has had recurrent hospitalizations and has been referred to [facility] for chronic idiopathic abdominal pain.  He also has an intrathecal pain pump placed on 02/07/2020. -Evaluated by oncology on admission.  Recommendation is to consider involving hospice at this time given overall progressive decline - continue supportive care for now; appreciate palliative care involvement   AKI (acute kidney injury) ([place]) - baseline creatinine ~ 0.8 - 1 - patient presents with increase in creat >0.3 mg/dL above baseline, creat increase >1.5x baseline presumed to have occurred within past 7 days PTA -Creatinine 1.74 on admission - Etiology  presumed prerenal/volume depletion due to history of poor oral intake prior to admission - Creatinine resolved back to normal with fluids.  Discontinue fluids on 12/18/2020   Macrocytic anemia - MCV 103, Hgb 6.5 g/dL on admission - discussed with oncology; will transfuse 1 unit PRBC on 8/21 -Iron stores adequate.  Holding off on iron repletion - Hemoglobin remaining stable  Adrenal insufficiency due to cancer therapy (HCC) - Continue hydrocortisone  Diarrhea - GI panel positive for Norovirus - cdiff negative - imodium PRN - supportive care and contact precautions   Diabetes mellitus type 2, uncomplicated (HCC) - O3Z 8.5% on 11/30/2020 - Continue SSI and CBG monitoring  Abdominal wall pain in epigastric region - Pain pump in place in left lower abdomen.  CLL (chronic lymphocytic leukemia) ([place]) - see failure to thrive    Old records reviewed in assessment of this patient  Antimicrobials:   DVT prophylaxis: SCD   Code Status:   Code Status: DNR Family Communication:   Disposition Plan: Status is: Inpatient  Remains inpatient appropriate because:Unsafe d/c plan and Inpatient level of care appropriate due to severity of illness  Dispo: The patient is from: Home              Anticipated d/c is to:  pending palliative care consult              Patient currently is not medically stable to d/c.   Difficult to place patient No  Risk of unplanned readmission score: Unplanned Admission- Pilot do not use: 37.04   Objective: Blood pressure 115/67, pulse 67, temperature 98.2 F (36.8 C), temperature source Oral, resp. rate 18, height 5\' 8"  (1.727 m), weight 50 kg, SpO2  99 %.  Examination: General appearance: cooperative, fatigued, no distress, and chronically ill appearing Head: Normocephalic, without obvious abnormality, atraumatic Eyes:  EOMI Lungs: clear to auscultation bilaterally Heart: regular rate and rhythm and S1, S2 normal Abdomen: soft, NT, ND, BS  present Extremities:  no edema Skin:  senile purpura Neurologic: Grossly normal  Consultants:  Oncology Palliative care   Procedures:    Data Reviewed: I have personally reviewed following labs and imaging studies Results for orders placed or performed during the hospital encounter of 12/15/20 (from the past 24 hour(s))  Glucose, capillary     Status: Abnormal   Collection Time: 12/17/20  4:50 PM  Result Value Ref Range   Glucose-Capillary 142 (H) 70 - 99 mg/dL  Glucose, capillary     Status: Abnormal   Collection Time: 12/17/20  9:53 PM  Result Value Ref Range   Glucose-Capillary 162 (H) 70 - 99 mg/dL  CBC with Differential/Platelet     Status: Abnormal   Collection Time: 12/18/20  5:32 AM  Result Value Ref Range   WBC 8.3 4.0 - 10.5 K/uL   RBC 2.38 (L) 4.22 - 5.81 MIL/uL   Hemoglobin 7.8 (L) 13.0 - 17.0 g/dL   HCT 23.5 (L) 39.0 - 52.0 %   MCV 98.7 80.0 - 100.0 fL   MCH 32.8 26.0 - 34.0 pg   MCHC 33.2 30.0 - 36.0 g/dL   RDW 20.3 (H) 11.5 - 15.5 %   Platelets 111 (L) 150 - 400 K/uL   nRBC 0.0 0.0 - 0.2 %   Neutrophils Relative % 23 %   Neutro Abs 1.9 1.7 - 7.7 K/uL   Lymphocytes Relative 69 %   Lymphs Abs 5.7 (H) 0.7 - 4.0 K/uL   Monocytes Relative 7 %   Monocytes Absolute 0.6 0.1 - 1.0 K/uL   Eosinophils Relative 0 %   Eosinophils Absolute 0.0 0.0 - 0.5 K/uL   Basophils Relative 1 %   Basophils Absolute 0.1 0.0 - 0.1 K/uL   RBC Morphology MORPHOLOGY UNREMARKABLE    Smear Review Normal platelet morphology    Immature Granulocytes 0 %   Abs Immature Granulocytes 0.02 0.00 - 0.07 K/uL  Magnesium     Status: None   Collection Time: 12/18/20  5:32 AM  Result Value Ref Range   Magnesium 1.9 1.7 - 2.4 mg/dL  Comprehensive metabolic panel     Status: Abnormal   Collection Time: 12/18/20  5:32 AM  Result Value Ref Range   Sodium 138 135 - 145 mmol/L   Potassium 4.2 3.5 - 5.1 mmol/L   Chloride 104 98 - 111 mmol/L   CO2 31 22 - 32 mmol/L   Glucose, Bld 133 (H) 70 -  99 mg/dL   BUN 19 8 - 23 mg/dL   Creatinine, Ser 1.05 0.61 - 1.24 mg/dL   Calcium 8.4 (L) 8.9 - 10.3 mg/dL   Total Protein 5.0 (L) 6.5 - 8.1 g/dL   Albumin 2.9 (L) 3.5 - 5.0 g/dL   AST 10 (L) 15 - 41 U/L   ALT 11 0 - 44 U/L   Alkaline Phosphatase 59 38 - 126 U/L   Total Bilirubin 0.4 0.3 - 1.2 mg/dL   GFR, Estimated >60 >60 mL/min   Anion gap 3 (L) 5 - 15  Glucose, capillary     Status: Abnormal   Collection Time: 12/18/20  9:28 AM  Result Value Ref Range   Glucose-Capillary 133 (H) 70 - 99 mg/dL  Glucose, capillary  Status: Abnormal   Collection Time: 12/18/20 12:08 PM  Result Value Ref Range   Glucose-Capillary 156 (H) 70 - 99 mg/dL    Recent Results (from the past 240 hour(s))  Blood culture (single)     Status: None (Preliminary result)   Collection Time: 12/15/20 10:18 AM   Specimen: BLOOD  Result Value Ref Range Status   Specimen Description BLOOD BLOOD LEFT FOREARM  Final   Special Requests   Final    BOTTLES DRAWN AEROBIC AND ANAEROBIC Blood Culture results may not be optimal due to an inadequate volume of blood received in culture bottles   Culture   Final    NO GROWTH 3 DAYS Performed at [facility], [address], [place], [place] [address]    Report Status PENDING  Incomplete  Resp Panel by RT-PCR (Flu A&B, Covid) Nasopharyngeal Swab     Status: None   Collection Time: 12/15/20 12:04 PM   Specimen: Nasopharyngeal Swab; Nasopharyngeal(NP) swabs in vial transport medium  Result Value Ref Range Status   SARS Coronavirus 2 by RT PCR NEGATIVE NEGATIVE Final    Comment: (NOTE) SARS-CoV-2 target nucleic acids are NOT DETECTED.  The SARS-CoV-2 RNA is generally detectable in upper respiratory specimens during the acute phase of infection. The lowest concentration of SARS-CoV-2 viral copies this assay can detect is 138 copies/mL. A negative result does not preclude SARS-Cov-2 infection and should not be used as the sole basis for treatment or other  patient management decisions. A negative result may occur with  improper specimen collection/handling, submission of specimen other than nasopharyngeal swab, presence of viral mutation(s) within the areas targeted by this assay, and inadequate number of viral copies(<138 copies/mL). A negative result must be combined with clinical observations, patient history, and epidemiological information. The expected result is Negative.  Fact Sheet for Patients:  EntrepreneurPulse.com.au  Fact Sheet for Healthcare Providers:  IncredibleEmployment.be  This test is no t yet approved or cleared by the [place] FDA and  has been authorized for detection and/or diagnosis of SARS-CoV-2 by FDA under an Emergency Use Authorization (EUA). This EUA will remain  in effect (meaning this test can be used) for the duration of the COVID-19 declaration under Section 564(b)(1) of the Act, 21 U.S.C.section 360bbb-3(b)(1), unless the authorization is terminated  or revoked sooner.       Influenza A by PCR NEGATIVE NEGATIVE Final   Influenza B by PCR NEGATIVE NEGATIVE Final    Comment: (NOTE) The Xpert Xpress SARS-CoV-2/FLU/RSV plus assay is intended as an aid in the diagnosis of influenza from Nasopharyngeal swab specimens and should not be used as a sole basis for treatment. Nasal washings and aspirates are unacceptable for Xpert Xpress SARS-CoV-2/FLU/RSV testing.  Fact Sheet for Patients: EntrepreneurPulse.com.au  Fact Sheet for Healthcare Providers: IncredibleEmployment.be  This test is not yet approved or cleared by the [place] FDA and has been authorized for detection and/or diagnosis of SARS-CoV-2 by FDA under an Emergency Use Authorization (EUA). This EUA will remain in effect (meaning this test can be used) for the duration of the COVID-19 declaration under Section 564(b)(1) of the Act, 21 U.S.C. section  360bbb-3(b)(1), unless the authorization is terminated or revoked.  Performed at [facility], [place],  [address]   C Difficile Quick Screen w PCR reflex     Status: None   Collection Time: 12/16/20 10:15 AM   Specimen: STOOL  Result Value Ref Range Status   C Diff antigen NEGATIVE  NEGATIVE Final   C Diff toxin NEGATIVE NEGATIVE Final   C Diff interpretation No C. difficile detected.  Final    Comment: Performed at [facility], [address] [place], [place] [address]  Gastrointestinal Panel by PCR , Stool     Status: Abnormal   Collection Time: 12/16/20 10:15 AM   Specimen: Stool  Result Value Ref Range Status   Campylobacter species NOT DETECTED NOT DETECTED Final   Plesimonas shigelloides NOT DETECTED NOT DETECTED Final   Salmonella species NOT DETECTED NOT DETECTED Final   Yersinia enterocolitica NOT DETECTED NOT DETECTED Final   Vibrio species NOT DETECTED NOT DETECTED Final   Vibrio cholerae NOT DETECTED NOT DETECTED Final   Enteroaggregative E coli (EAEC) NOT DETECTED NOT DETECTED Final   Enteropathogenic E coli (EPEC) NOT DETECTED NOT DETECTED Final   Enterotoxigenic E coli (ETEC) NOT DETECTED NOT DETECTED Final   Shiga like toxin producing E coli (STEC) NOT DETECTED NOT DETECTED Final   Shigella/Enteroinvasive E coli (EIEC) NOT DETECTED NOT DETECTED Final   Cryptosporidium NOT DETECTED NOT DETECTED Final   Cyclospora cayetanensis NOT DETECTED NOT DETECTED Final   Entamoeba histolytica NOT DETECTED NOT DETECTED Final   Giardia lamblia NOT DETECTED NOT DETECTED Final   Adenovirus F40/41 NOT DETECTED NOT DETECTED Final   Astrovirus NOT DETECTED NOT DETECTED Final   Norovirus GI/GII DETECTED (A) NOT DETECTED Final    Comment: RESULT CALLED TO, READ BACK BY AND VERIFIED WITH: JOY BEEMAN AT 1823 12/16/20.PMF    Rotavirus A NOT DETECTED NOT DETECTED Final   Sapovirus (I, II, IV, and V) NOT DETECTED NOT DETECTED Final    Comment:  Performed at [facility], [address] [place], [place] [address]     Radiology Studies: No results found. DG Chest Port 1 View  Final Result      Scheduled Meds:  sodium chloride   Intravenous Once   feeding supplement  237 mL Oral TID BM   folic acid  1 mg Oral Daily   gabapentin  300 mg Oral BID   hydrocortisone  20 mg Oral QPM   hydrocortisone  30 mg Oral Daily   insulin aspart  0-5 Units Subcutaneous QHS   insulin aspart  0-6 Units Subcutaneous TID WC   multivitamin with minerals  1 tablet Oral Daily   naloxegol oxalate  25 mg Oral Daily   pantoprazole  40 mg Oral QHS   PRN Meds: acetaminophen **OR** acetaminophen, loperamide, ondansetron **OR** ondansetron (ZOFRAN) IV, oxyCODONE Continuous Infusions:     LOS: 2 days  Time spent: Greater than 50% of the 35 minute visit was spent in counseling/coordination of care for the patient as laid out in the A&P.   Dwyane Dee, MD [facility] 12/18/2020, 4:11 PM

## 2020-12-18 NOTE — Progress Notes (Signed)
James [name]   DOB:[birth year]   HW#:[id number]    Subjective: No acute events overnight.  Continues to complain of generalized weakness.  No nausea no vomiting.  Chronic mild abdominal pain not any worse.  Objective:  Vitals:   12/18/20 1206 12/18/20 1641  BP: 115/67 138/72  Pulse: 67 76  Resp: 18 18  Temp: 98.2 F (36.8 C) 98.6 F (37 C)  SpO2: 99% 100%     Intake/Output Summary (Last 24 hours) at 12/18/2020 2212 Last data filed at 12/18/2020 1930 Gross per 24 hour  Intake 1645.81 ml  Output 1550 ml  Net 95.81 ml    Physical Exam Vitals and nursing note reviewed.  Constitutional:      Comments: Pale /cachectic appearing male patient resting alone.  No acute distress.    HENT:     Head: Normocephalic and atraumatic.     Mouth/Throat:     Mouth: Mucous membranes are moist.     Pharynx: No oropharyngeal exudate.  Eyes:     Pupils: Pupils are equal, round, and reactive to light.  Cardiovascular:     Rate and Rhythm: Normal rate and regular rhythm.  Pulmonary:     Effort: No respiratory distress.     Breath sounds: No wheezing.     Comments: Decreased breath sounds bilaterally at bases.  No wheeze or crackles Abdominal:     General: Bowel sounds are normal. There is no distension.     Palpations: Abdomen is soft. There is no mass.     Tenderness: There is no abdominal tenderness. There is no guarding or rebound.  Musculoskeletal:        General: No tenderness. Normal range of motion.     Cervical back: Normal range of motion and neck supple.  Skin:    General: Skin is warm.  Neurological:     Mental Status: He is alert and oriented to person, place, and time.  Psychiatric:        Mood and Affect: Affect normal.        Judgment: Judgment normal.     Labs:  Lab Results  Component Value Date   WBC 8.3 12/18/2020   HGB 7.8 (L) 12/18/2020   HCT 23.5 (L) 12/18/2020   MCV 98.7 12/18/2020   PLT 111 (L) 12/18/2020   NEUTROABS 1.9 12/18/2020    Lab Results   Component Value Date   NA 138 12/18/2020   K 4.2 12/18/2020   CL 104 12/18/2020   CO2 31 12/18/2020    Studies:  No results found.  CLL (chronic lymphocytic leukemia) ([place]) #70 year old male patient with a history of CLL/relapsed bone marrow/11 P deletion 7 q. Deletion/ chronic abdominal pain of unclear etiology-s/p pain pump; question adrenal insufficiency is currently admitted to hospital for worsening generalized weakness.   #Symptomatic severe anemia-multifactorial; suspect predominant CLL/SLL involving the bone marrow BX April 2022 bone marrow biopsy. July 2022- EGD shows multiple AV malformations stomach status post argon laser.  Hb 6.5.  S/p PRBC transfusion hemoglobin greater than 7.  Hold off IV iron as iron studies are adequate.  Most recent hemoglobin 7.8; plan 1 unit of PRBC transfusion tomorrow if plan for discharge.   #Abdominal pain chronic-s/p evaluation with pain clinic s/p pain pump; question adrenal insufficiency currently on hydrocortisone.    # DNR/DNI  #Had a long discussion with the patient-that with current supportive care patient life expectancy is approximately 6 months or so.  However if patient chooses hospice-life estimated in order of  1 to 2 months.  Patient states that he would continue his current scope of treatment including blood transfusions/supportive care/and also if strong enough consider treatment for CLL.  He expressed interest in moving to continue to live with his son.  I had a long discussion with the son, [name] regarding the above life expectancy estimates.  He understands about estimates purely based on current clinical scenario.  [name] is interested in moving his father to live with him in [place].  I had a long discussion with [name] regarding the logistical challenges involved transfer of care with the patient is sick/needing care on a weekly basis.  [name] will contact-his local cancer center in [facility];  and inform us of the referral fax number-for [place] to fax the referral.  [name] also understands that if patient gets "sick" in between clinic appointments , pt might end up in the hospital where he could establish care with oncology as inpatient, which unfortunately is not ideal situation, but many more practical.   For now patient is clinically stable from oncology standpoint-to be discharged/based upon recommendations from physical therapy/social work.  We will coordinate with Josh.   # 40 minutes face-to-face with the patient discussing the above plan of care; more than 50% of time spent on prognosis/ natural history; counseling and coordination.   Cammie Sickle, MD 12/18/2020  10:12 PM

## 2020-12-19 DIAGNOSIS — R627 Adult failure to thrive: Secondary | ICD-10-CM | POA: Diagnosis not present

## 2020-12-19 DIAGNOSIS — Z515 Encounter for palliative care: Secondary | ICD-10-CM | POA: Diagnosis not present

## 2020-12-19 LAB — COMPREHENSIVE METABOLIC PANEL
ALT: 33 U/L (ref 0–44)
AST: 27 U/L (ref 15–41)
Albumin: 3 g/dL — ABNORMAL LOW (ref 3.5–5.0)
Alkaline Phosphatase: 59 U/L (ref 38–126)
Anion gap: 6 (ref 5–15)
BUN: 17 mg/dL (ref 8–23)
CO2: 31 mmol/L (ref 22–32)
Calcium: 8.4 mg/dL — ABNORMAL LOW (ref 8.9–10.3)
Chloride: 105 mmol/L (ref 98–111)
Creatinine, Ser: 0.84 mg/dL (ref 0.61–1.24)
GFR, Estimated: >60 mL/min (ref >60)
Glucose, Bld: 110 mg/dL — ABNORMAL HIGH (ref 70–99)
Potassium: 3.9 mmol/L (ref 3.5–5.1)
Sodium: 142 mmol/L (ref 135–145)
Total Bilirubin: 0.7 mg/dL (ref 0.3–1.2)
Total Protein: 5 g/dL — ABNORMAL LOW (ref 6.5–8.1)

## 2020-12-19 LAB — CBC WITH DIFFERENTIAL/PLATELET
Abs Immature Granulocytes: 0.02 10*3/uL (ref 0.00–0.07)
Basophils Absolute: 0.1 10*3/uL (ref 0.0–0.1)
Basophils Relative: 1 %
Eosinophils Absolute: 0 10*3/uL (ref 0.0–0.5)
Eosinophils Relative: 0 %
HCT: 23.6 % — ABNORMAL LOW (ref 39.0–52.0)
Hemoglobin: 7.8 g/dL — ABNORMAL LOW (ref 13.0–17.0)
Immature Granulocytes: 0 %
Lymphocytes Relative: 75 %
Lymphs Abs: 5.9 10*3/uL — ABNORMAL HIGH (ref 0.7–4.0)
MCH: 33.2 pg (ref 26.0–34.0)
MCHC: 33.1 g/dL (ref 30.0–36.0)
MCV: 100.4 fL — ABNORMAL HIGH (ref 80.0–100.0)
Monocytes Absolute: 0.5 10*3/uL (ref 0.1–1.0)
Monocytes Relative: 6 %
Neutro Abs: 1.5 10*3/uL — ABNORMAL LOW (ref 1.7–7.7)
Neutrophils Relative %: 18 %
Platelets: 108 10*3/uL — ABNORMAL LOW (ref 150–400)
RBC: 2.35 MIL/uL — ABNORMAL LOW (ref 4.22–5.81)
RDW: 19.9 % — ABNORMAL HIGH (ref 11.5–15.5)
Smear Review: NORMAL
WBC: 7.9 10*3/uL (ref 4.0–10.5)
nRBC: 0 % (ref 0.0–0.2)

## 2020-12-19 LAB — GLUCOSE, CAPILLARY
Glucose-Capillary: 90 mg/dL (ref 70–99)
Glucose-Capillary: 166 mg/dL — ABNORMAL HIGH (ref 70–99)
Glucose-Capillary: 154 mg/dL — ABNORMAL HIGH (ref 70–99)
Glucose-Capillary: 149 mg/dL — ABNORMAL HIGH (ref 70–99)

## 2020-12-19 LAB — MAGNESIUM: Magnesium: 1.9 mg/dL (ref 1.7–2.4)

## 2020-12-19 MED ORDER — KATE FARMS STANDARD 1.4 PO LIQD
325.0000 mL | Freq: Three times a day (TID) | ORAL | Status: DC
  Administered 2020-12-19 – 2020-12-20 (×2): 325 mL via ORAL
  Filled 2020-12-19: qty 325

## 2020-12-19 NOTE — Care Management Important Message (Signed)
Important Message  Patient Details  Name: QUAY SIMKIN MRN: [id number] Date of Birth: Sep 15, 1950   Medicare Important Message Given:  Other (see comment)  Patient is in an isolation room so I called his room [phone]) to review the Important Message from Medicare but there was no answer.  Will try again.  Juliann Pulse A Mercedes Fort 12/19/2020, 2:18 PM

## 2020-12-19 NOTE — Evaluation (Addendum)
Physical Therapy Evaluation  Patient Details Name: James [name] MRN: [id number] DOB: Apr 05, 1951 Today's Date: 12/19/2020   History of Present Illness  70 year old male with PMH stage IV CLL, depression, DM II, hypertension, hyperlipidemia, GERD who presented to the hospital with generalized weakness. He has had worsening oral intake and lightheadedness/dizziness at home, but denies any falls. He was recently hospitalized earlier in August for CAP.  Clinical Impression  Pt alert, seated in recliner working with OT beginning of session.Pt states PLOF is mod-I with rollator for household mobility, independent with ADLs, but requires assistance for driving, mowing the lawn, and prepping for food.   Pt demonstrated sit <> stand with supervision without AD. Pt stands statically without sway or LOB, but prefers to have BUE support. Pt ambulated w/ min-guard short distance without AD, absent signs of instability. A RW is recommended for future ambulation to improve gait speed and stability as needed. Vitals were assessed due to momentary symptoms of dizziness when working with OT. Pt was asymptomatic throughout treatment with PT (see below for values). Skilled PT intervention is indicated to address deficits in function, mobility, and to return to PLOF as able. HHPT w/ 24-assist is recommended at discharge.   VITALS: (seated) BP: 132/92, HR 90; (standing) BP: 97/67; (seated): BP: 138/78; (standing): BP: 99/70, HR: 75, (standing after 2 min) BP: 91/72     Follow Up Recommendations Home health PT;Supervision/Assistance - 24 hour    Equipment Recommendations  Rolling walker with 5" wheels    Recommendations for Other Services       Precautions / Restrictions Precautions Precautions: Fall Restrictions Weight Bearing Restrictions: No      Mobility  Bed Mobility    General bed mobility comments: Pt seated in recliner working with OT beginning of shift    Transfers Overall transfer level:  Needs assistance Equipment used: None Transfers: Sit to/from Stand Sit to Stand: Supervision         General transfer comment: cues for hand placement  Ambulation/Gait Ambulation/Gait assistance: Min guard Gait Distance (Feet): 30 Feet Assistive device: None Gait Pattern/deviations: Decreased stride length Gait velocity: decreased   General Gait Details: Decreased stride length with lack of trunk rotation, & arm swing. Following ambulation, BP checked in standing 97/67 pt remained asymptomatic  Stairs            Wheelchair Mobility    Modified Rankin (Stroke Patients Only)       Balance Overall balance assessment: Needs assistance Sitting-balance support: Feet supported;No upper extremity supported Sitting balance-Leahy Scale: Good Sitting balance - Comments: good dynamic sitting balance at EOB   Standing balance support: No upper extremity supported;During functional activity Standing balance-Leahy Scale: Good Standing balance comment: Pt is able to tolerate min to mod challenge in standing                             Pertinent Vitals/Pain Pain Assessment: No/denies pain    Home Living Family/patient expects to be discharged to:: Private residence Living Arrangements: Alone Available Help at Discharge: Friend(s);Available PRN/intermittently;Neighbor Type of Home: House Home Access: Stairs to enter Entrance Stairs-Rails: Psychiatric nurse of Steps: 2 Home Layout: One level Home Equipment: Environmental consultant - 4 wheels      Prior Function Level of Independence: Needs assistance   Gait / Transfers Assistance Needed: ambulates w/ rollator for household mobility  ADL's / Homemaking Assistance Needed: Pt is indep with ADLs, friends provide food, groceries, and provide  assistance for laundry, and mowing  Comments: Pt does not drive unless it's close to home     Hand Dominance        Extremity/Trunk Assessment   Upper Extremity  Assessment Upper Extremity Assessment: Overall WFL for tasks assessed    Lower Extremity Assessment Lower Extremity Assessment: Overall WFL for tasks assessed       Communication   Communication: No difficulties  Cognition Arousal/Alertness: Awake/alert Behavior During Therapy: WFL for tasks assessed/performed Overall Cognitive Status: Within Functional Limits for tasks assessed                                 General Comments: AOx3,able to follow simple commands easily      General Comments     Exercises Other Exercises Other Exercises: Orthostatic testing: (seated) BP: 132/92, (standing) BP: 99/70, (standing + 2 min): BP: 91/72   Assessment/Plan    PT Assessment Patient needs continued PT services  PT Problem List Decreased activity tolerance;Decreased balance;Decreased mobility;Decreased strength;Decreased range of motion       PT Treatment Interventions DME instruction;Gait training;Functional mobility training;Therapeutic activities;Therapeutic exercise;Balance training;Stair training;Cognitive remediation;Patient/family education;Neuromuscular re-education    PT Goals (Current goals can be found in the Care Plan section)  Acute Rehab PT Goals Patient Stated Goal: To go home PT Goal Formulation: With patient Time For Goal Achievement: 01/02/21 Potential to Achieve Goals: Fair    Frequency Min 2X/week   Barriers to discharge Decreased caregiver support      Co-evaluation               AM-PAC PT "6 Clicks" Mobility  Outcome Measure Help needed turning from your back to your side while in a flat bed without using bedrails?: None Help needed moving from lying on your back to sitting on the side of a flat bed without using bedrails?: None Help needed moving to and from a bed to a chair (including a wheelchair)?: None Help needed standing up from a chair using your arms (e.g., wheelchair or bedside chair)?: None Help needed to walk in  hospital room?: A Little Help needed climbing 3-5 steps with a railing? : A Little 6 Click Score: 22    End of Session Equipment Utilized During Treatment: Gait belt Activity Tolerance: Patient tolerated treatment well Patient left: in chair;with call bell/phone within reach;with chair alarm set Nurse Communication: Mobility status;Other (comment) (Changes in vitals) PT Visit Diagnosis: Unsteadiness on feet (R26.81);History of falling (Z91.81);Muscle weakness (generalized) (M62.81)    Time: 9476-5465 PT Time Calculation (min) (ACUTE ONLY): 21 min   Charges:            [employer], SPT    This entire session was performed under direct supervision and direction of a licensed therapist. I have personally read, edited and approve of the note as written.   Lieutenant Diego PT, DPT 12:15 PM,12/19/20

## 2020-12-19 NOTE — Progress Notes (Signed)
Progress Note    James [name]   BTD:[id number]  DOB: 05-08-1950  DOA: 12/15/2020     3  PCP: [name], MD  Initial CC: weakness  Hospital Course: James [name] is a 70 year old male with PMH stage IV CLL, depression, DM II, hypertension, hyperlipidemia, GERD who presented to the hospital with generalized weakness.  He was recently hospitalized earlier in August for CAP. In general he has had an overall progressive decline with recurrent illnesses and having difficulty recovering from each per oncology. He has had worsening oral intake and associated lightheadedness/dizziness at home. He is admitted for further evaluation with oncology and evaluation by palliative care with consideration for pursuing hospice.  Interval History:  No events overnight.  Feels about the same.  Pain mostly controlled.  He states that he wishes to go home tomorrow and then plans to go live with his son in [place] after that.  ROS: Constitutional: negative for chills and fevers, Respiratory: negative for cough and sputum, Cardiovascular: negative for chest pain, and Gastrointestinal: negative for abdominal pain  Assessment & Plan: * Failure to thrive in adult - History of stage IV CLL (dx 2006) - overall decline recently and has had recurrent hospitalizations and has been referred to [facility] for chronic idiopathic abdominal pain.  He also has an intrathecal pain pump placed on 02/07/2020. -Evaluated by oncology on admission.  Recommendation is to consider involving hospice at this time given overall progressive decline - continue supportive care for now; appreciate palliative care involvement -Tentative plan at this time is for discharge home on Wednesday and then he plans to go stay with his son in [place].   AKI (acute kidney injury) ([place]) - baseline creatinine ~ 0.8 - 1 - patient presents with increase in creat >0.3 mg/dL above baseline, creat increase >1.5x baseline presumed to have occurred  within past 7 days PTA -Creatinine 1.74 on admission - Etiology presumed prerenal/volume depletion due to history of poor oral intake prior to admission - Creatinine resolved back to normal with fluids.  Discontinue fluids on 12/18/2020   Macrocytic anemia - MCV 103, Hgb 6.5 g/dL on admission - discussed with oncology; will transfuse 1 unit PRBC on 8/21 -Iron stores adequate.  Holding off on iron repletion - Hemoglobin remaining stable  Adrenal insufficiency due to cancer therapy (HCC) - Continue hydrocortisone  Diarrhea - GI panel positive for Norovirus - cdiff negative - imodium PRN - supportive care and contact precautions   Diabetes mellitus type 2, uncomplicated (HCC) - X6I 6.8% on 11/30/2020 - Continue SSI and CBG monitoring  Abdominal wall pain in epigastric region - Pain pump in place in left lower abdomen.  CLL (chronic lymphocytic leukemia) ([place]) - see failure to thrive    Old records reviewed in assessment of this patient  Antimicrobials:   DVT prophylaxis: SCD   Code Status:   Code Status: DNR Family Communication:   Disposition Plan: Status is: Inpatient  Remains inpatient appropriate because:Unsafe d/c plan and Inpatient level of care appropriate due to severity of illness  Dispo: The patient is from: Home              Anticipated d/c is to: Home on 12/20/2020              Patient currently is medically stable to d/c.   Difficult to place patient No  Risk of unplanned readmission score: Unplanned Admission- Pilot do not use: 37.08   Objective: Blood pressure 121/65, pulse 69, temperature 98 F (  36.7 C), resp. rate 16, height 5\' 8"  (1.727 m), weight 50 kg, SpO2 100 %.  Examination: General appearance: cooperative, fatigued, no distress, and chronically ill appearing Head: Normocephalic, without obvious abnormality, atraumatic Eyes:  EOMI Lungs: clear to auscultation bilaterally Heart: regular rate and rhythm and S1, S2 normal Abdomen: soft, NT,  ND, BS present Extremities:  no edema Skin:  senile purpura Neurologic: Grossly normal  Consultants:  Oncology Palliative care   Procedures:    Data Reviewed: I have personally reviewed following labs and imaging studies Results for orders placed or performed during the hospital encounter of 12/15/20 (from the past 24 hour(s))  Glucose, capillary     Status: Abnormal   Collection Time: 12/18/20  4:42 PM  Result Value Ref Range   Glucose-Capillary 163 (H) 70 - 99 mg/dL  Glucose, capillary     Status: Abnormal   Collection Time: 12/18/20  9:31 PM  Result Value Ref Range   Glucose-Capillary 148 (H) 70 - 99 mg/dL  CBC with Differential/Platelet     Status: Abnormal   Collection Time: 12/19/20  5:20 AM  Result Value Ref Range   WBC 7.9 4.0 - 10.5 K/uL   RBC 2.35 (L) 4.22 - 5.81 MIL/uL   Hemoglobin 7.8 (L) 13.0 - 17.0 g/dL   HCT 23.6 (L) 39.0 - 52.0 %   MCV 100.4 (H) 80.0 - 100.0 fL   MCH 33.2 26.0 - 34.0 pg   MCHC 33.1 30.0 - 36.0 g/dL   RDW 19.9 (H) 11.5 - 15.5 %   Platelets 108 (L) 150 - 400 K/uL   nRBC 0.0 0.0 - 0.2 %   Neutrophils Relative % 18 %   Neutro Abs 1.5 (L) 1.7 - 7.7 K/uL   Lymphocytes Relative 75 %   Lymphs Abs 5.9 (H) 0.7 - 4.0 K/uL   Monocytes Relative 6 %   Monocytes Absolute 0.5 0.1 - 1.0 K/uL   Eosinophils Relative 0 %   Eosinophils Absolute 0.0 0.0 - 0.5 K/uL   Basophils Relative 1 %   Basophils Absolute 0.1 0.0 - 0.1 K/uL   WBC Morphology MORPHOLOGY UNREMARKABLE    RBC Morphology MORPHOLOGY UNREMARKABLE    Smear Review Normal platelet morphology    Immature Granulocytes 0 %   Abs Immature Granulocytes 0.02 0.00 - 0.07 K/uL  Magnesium     Status: None   Collection Time: 12/19/20  5:20 AM  Result Value Ref Range   Magnesium 1.9 1.7 - 2.4 mg/dL  Comprehensive metabolic panel     Status: Abnormal   Collection Time: 12/19/20  5:20 AM  Result Value Ref Range   Sodium 142 135 - 145 mmol/L   Potassium 3.9 3.5 - 5.1 mmol/L   Chloride 105 98 - 111  mmol/L   CO2 31 22 - 32 mmol/L   Glucose, Bld 110 (H) 70 - 99 mg/dL   BUN 17 8 - 23 mg/dL   Creatinine, Ser 0.84 0.61 - 1.24 mg/dL   Calcium 8.4 (L) 8.9 - 10.3 mg/dL   Total Protein 5.0 (L) 6.5 - 8.1 g/dL   Albumin 3.0 (L) 3.5 - 5.0 g/dL   AST 27 15 - 41 U/L   ALT 33 0 - 44 U/L   Alkaline Phosphatase 59 38 - 126 U/L   Total Bilirubin 0.7 0.3 - 1.2 mg/dL   GFR, Estimated >60 >60 mL/min   Anion gap 6 5 - 15  Glucose, capillary     Status: None   Collection Time: 12/19/20  8:10 AM  Result Value Ref Range   Glucose-Capillary 90 70 - 99 mg/dL  Glucose, capillary     Status: Abnormal   Collection Time: 12/19/20 12:44 PM  Result Value Ref Range   Glucose-Capillary 166 (H) 70 - 99 mg/dL    Recent Results (from the past 240 hour(s))  Blood culture (single)     Status: None (Preliminary result)   Collection Time: 12/15/20 10:18 AM   Specimen: BLOOD  Result Value Ref Range Status   Specimen Description BLOOD BLOOD LEFT FOREARM  Final   Special Requests   Final    BOTTLES DRAWN AEROBIC AND ANAEROBIC Blood Culture results may not be optimal due to an inadequate volume of blood received in culture bottles   Culture   Final    NO GROWTH 4 DAYS Performed at [facility], [address] [place], [place] [address]    Report Status PENDING  Incomplete  Resp Panel by RT-PCR (Flu A&B, Covid) Nasopharyngeal Swab     Status: None   Collection Time: 12/15/20 12:04 PM   Specimen: Nasopharyngeal Swab; Nasopharyngeal(NP) swabs in vial transport medium  Result Value Ref Range Status   SARS Coronavirus 2 by RT PCR NEGATIVE NEGATIVE Final    Comment: (NOTE) SARS-CoV-2 target nucleic acids are NOT DETECTED.  The SARS-CoV-2 RNA is generally detectable in upper respiratory specimens during the acute phase of infection. The lowest concentration of SARS-CoV-2 viral copies this assay can detect is 138 copies/mL. A negative result does not preclude SARS-Cov-2 infection and should not be used as  the sole basis for treatment or other patient management decisions. A negative result may occur with  improper specimen collection/handling, submission of specimen other than nasopharyngeal swab, presence of viral mutation(s) within the areas targeted by this assay, and inadequate number of viral copies(<138 copies/mL). A negative result must be combined with clinical observations, patient history, and epidemiological information. The expected result is Negative.  Fact Sheet for Patients:  EntrepreneurPulse.com.au  Fact Sheet for Healthcare Providers:  IncredibleEmployment.be  This test is no t yet approved or cleared by the [place] FDA and  has been authorized for detection and/or diagnosis of SARS-CoV-2 by FDA under an Emergency Use Authorization (EUA). This EUA will remain  in effect (meaning this test can be used) for the duration of the COVID-19 declaration under Section 564(b)(1) of the Act, 21 U.S.C.section 360bbb-3(b)(1), unless the authorization is terminated  or revoked sooner.       Influenza A by PCR NEGATIVE NEGATIVE Final   Influenza B by PCR NEGATIVE NEGATIVE Final    Comment: (NOTE) The Xpert Xpress SARS-CoV-2/FLU/RSV plus assay is intended as an aid in the diagnosis of influenza from Nasopharyngeal swab specimens and should not be used as a sole basis for treatment. Nasal washings and aspirates are unacceptable for Xpert Xpress SARS-CoV-2/FLU/RSV testing.  Fact Sheet for Patients: EntrepreneurPulse.com.au  Fact Sheet for Healthcare Providers: IncredibleEmployment.be  This test is not yet approved or cleared by the [place] FDA and has been authorized for detection and/or diagnosis of SARS-CoV-2 by FDA under an Emergency Use Authorization (EUA). This EUA will remain in effect (meaning this test can be used) for the duration of the COVID-19 declaration under Section 564(b)(1) of  the Act, 21 U.S.C. section 360bbb-3(b)(1), unless the authorization is terminated or revoked.  Performed at [facility], [address], [place],  [address]   C Difficile Quick Screen w PCR reflex     Status: None  Collection Time: 12/16/20 10:15 AM   Specimen: STOOL  Result Value Ref Range Status   C Diff antigen NEGATIVE NEGATIVE Final   C Diff toxin NEGATIVE NEGATIVE Final   C Diff interpretation No C. difficile detected.  Final    Comment: Performed at [facility], [address] [place], [place] [address]  Gastrointestinal Panel by PCR , Stool     Status: Abnormal   Collection Time: 12/16/20 10:15 AM   Specimen: Stool  Result Value Ref Range Status   Campylobacter species NOT DETECTED NOT DETECTED Final   Plesimonas shigelloides NOT DETECTED NOT DETECTED Final   Salmonella species NOT DETECTED NOT DETECTED Final   Yersinia enterocolitica NOT DETECTED NOT DETECTED Final   Vibrio species NOT DETECTED NOT DETECTED Final   Vibrio cholerae NOT DETECTED NOT DETECTED Final   Enteroaggregative E coli (EAEC) NOT DETECTED NOT DETECTED Final   Enteropathogenic E coli (EPEC) NOT DETECTED NOT DETECTED Final   Enterotoxigenic E coli (ETEC) NOT DETECTED NOT DETECTED Final   Shiga like toxin producing E coli (STEC) NOT DETECTED NOT DETECTED Final   Shigella/Enteroinvasive E coli (EIEC) NOT DETECTED NOT DETECTED Final   Cryptosporidium NOT DETECTED NOT DETECTED Final   Cyclospora cayetanensis NOT DETECTED NOT DETECTED Final   Entamoeba histolytica NOT DETECTED NOT DETECTED Final   Giardia lamblia NOT DETECTED NOT DETECTED Final   Adenovirus F40/41 NOT DETECTED NOT DETECTED Final   Astrovirus NOT DETECTED NOT DETECTED Final   Norovirus GI/GII DETECTED (A) NOT DETECTED Final    Comment: RESULT CALLED TO, READ BACK BY AND VERIFIED WITH: JOY BEEMAN AT 1823 12/16/20.PMF    Rotavirus A NOT DETECTED NOT DETECTED Final   Sapovirus (I, II, IV, and V) NOT DETECTED NOT  DETECTED Final    Comment: Performed at [facility], [address], [place], [place] [address]     Radiology Studies: No results found. DG Chest Port 1 View  Final Result      Scheduled Meds:  sodium chloride   Intravenous Once   feeding supplement (KATE [employer]ARMS STANDARD 1.4)  325 mL Oral TID BM   folic acid  1 mg Oral Daily   gabapentin  300 mg Oral BID   hydrocortisone  20 mg Oral QPM   hydrocortisone  30 mg Oral Daily   insulin aspart  0-5 Units Subcutaneous QHS   insulin aspart  0-6 Units Subcutaneous TID WC   multivitamin with minerals  1 tablet Oral Daily   naloxegol oxalate  25 mg Oral Daily   pantoprazole  40 mg Oral QHS   PRN Meds: acetaminophen **OR** acetaminophen, loperamide, ondansetron **OR** ondansetron (ZOFRAN) IV, oxyCODONE Continuous Infusions:     LOS: 3 days  Time spent: Greater than 50% of the 35 minute visit was spent in counseling/coordination of care for the patient as laid out in the A&P.   Dwyane Dee, MD [facility] 12/19/2020, 1:21 PM

## 2020-12-19 NOTE — Progress Notes (Signed)
Initial Nutrition Assessment  DOCUMENTATION CODES:  Severe malnutrition in context of chronic illness  INTERVENTION:  Liberalize diet to regular for poor PO intake, weight loss, and underweight BMI Change supplements to [employer] TID to provide 455 kcal and 20g of protein per serving Continue MVI with minerals daily Request new measured weight  NUTRITION DIAGNOSIS:  Severe Malnutrition (in the context of chronic illness) related to cancer and cancer related treatments as evidenced by severe fat depletion, severe muscle depletion.  GOAL:  Patient will meet greater than or equal to 90% of their needs  MONITOR:  PO intake, Supplement acceptance, Weight trends  REASON FOR ASSESSMENT:  Consult, Malnutrition Screening Tool Assessment of nutrition requirement/status  ASSESSMENT:  70 y.o. male with history of stage IV CLL, CKD3, GERD, DM type 2, HLD, HTN, and chronic abdominal pain presented to ED with worsening abdominal pain and weakness. Several recent admissions and followed by RD in the past.  Family currently working with palliative care to determine dc disposition. Deciding between home with hospice and hospice home.   Pt resting in bedside chair at the time of visit. Reports that appetite has been fair this admission, thinks he is eating as much as he would be at home. Pt reiterates the fact that he does not tolerate ensure or boost supplements but is agreeable to [employer]. Prefers the vanilla flavor. Significant muscle and fat deficits noted on exam, particularly to the lower extremities. Pt reports that he ambulates with a walker at baseline.   Pt does report feeling constipated, believes that his last BM was Sunday (8/21). No bowel regimen in place at this time. Will mention to MD and RN.  Nutritionally Relevant Medications: Scheduled Meds:  feeding supplement  237 mL Oral TID BM   folic acid  1 mg Oral Daily   insulin aspart  0-5 Units Subcutaneous QHS   insulin aspart   0-6 Units Subcutaneous TID WC   multivitamin with minerals  1 tablet Oral Daily   naloxegol oxalate  25 mg Oral Daily   pantoprazole  40 mg Oral QHS   PRN Meds: loperamide, ondansetron   Labs Reviewed: HgbA1c 5.6% (8/4)  NUTRITION - FOCUSED PHYSICAL EXAM: Flowsheet Row Most Recent Value  Orbital Region Mild depletion  Upper Arm Region Severe depletion  Thoracic and Lumbar Region Severe depletion  Buccal Region Mild depletion  Temple Region Mild depletion  Clavicle Bone Region Moderate depletion  Clavicle and Acromion Bone Region Severe depletion  Scapular Bone Region Severe depletion  Dorsal Hand Moderate depletion  Patellar Region Severe depletion  Anterior Thigh Region Severe depletion  Posterior Calf Region Severe depletion  Edema (RD Assessment) None  Hair Reviewed  Eyes Reviewed  Mouth Reviewed  Skin Reviewed  Nails Reviewed   Diet Order:   Diet Order             Diet regular Room service appropriate? Yes; Fluid consistency: Thin  Diet effective now                   EDUCATION NEEDS:  No education needs have been identified at this time  Skin:  Skin Assessment: Reviewed RN Assessment  Last BM:  8/21  Height:  Ht Readings from Last 1 Encounters:  12/15/20 5\' 8"  (1.727 m)    Weight:  Wt Readings from Last 1 Encounters:  12/15/20 50 kg    Ideal Body Weight:  70 kg  BMI:  Body mass index is 16.76 kg/m.  Estimated Nutritional  Needs:  Kcal:  1800-2000 kcal/d Protein:  90-100 g/d Fluid:  1.8-2 L/d   Ranell Patrick, RD, LDN Clinical Dietitian [place]

## 2020-12-19 NOTE — Progress Notes (Signed)
[place]  [phone] [phone]   Name: VIRL COBLE Date: 12/19/2020 MRN: [id number]  DOB: 01-27-1951  Patient Care Team: [name], MD as PCP - General (Internal Medicine) Cammie Sickle, MD as Consulting Physician (Hematology and Oncology) Quaniya Damas, Kirt Boys, NP as Nurse Practitioner (Hospice and Palliative Medicine) Verlon Au, NP as Nurse Practitioner (Hematology and Oncology) Jacquelin Hawking, NP as Nurse Practitioner (Oncology) Lonell Face, NP as Nurse Practitioner (Neurosurgery) Milinda Pointer, MD as Consulting Physician (Pain Medicine)    REASON FOR CONSULTATION: James [name] is a 71 y.o. male with multiple medical problems including stage IV CLL (initially diagnosed in 2006) most recently treated with obinutuzumab.  Patient has had severe and chronic abdominal pain of unclear etiology.  He has had extensive work-up including referral to [facility].  He has been followed by interventional pain management and is status post intrathecal pain pump (placed 02/07/2020).  Patient was hospitalized 11/21/2020 to 11/25/2020 with symptomatic anemia thought secondary to slow GI bleed with AVM noted on EGD.  He was again hospitalized 11/30/2020 to 12/05/2020 with sepsis from HCAP.  Patient is now readmitted 12/15/2020 with syncope and AKI.  He was referred to palliative care to help address goals and manage ongoing symptoms.  CODE STATUS: DNR/DNI (MOST form completed on 07/08/19)  PAST MEDICAL HISTORY: Past Medical History:  Diagnosis Date   CLL (chronic lymphocytic leukemia) ([place])    Constipation    Depression    Diabetes mellitus without complication (HCC)    GERD (gastroesophageal reflux disease)    Hematuria    Hyperlipidemia    Hypertension    Lactic acidosis 12/15/2020   Therapeutic opioid induced constipation     PAST SURGICAL HISTORY:  Past Surgical History:  Procedure Laterality  Date   CATARACT EXTRACTION W/ INTRAOCULAR LENS IMPLANT Bilateral    CHOLECYSTECTOMY  1983   COLONOSCOPY WITH PROPOFOL N/A 10/27/2017   Procedure: COLONOSCOPY WITH PROPOFOL;  Surgeon: Manya Silvas, MD;  Location: [facility] ENDOSCOPY;  Service: Endoscopy;  Laterality: N/A;   ESOPHAGOGASTRODUODENOSCOPY (EGD) WITH PROPOFOL N/A 11/20/2016   Procedure: ESOPHAGOGASTRODUODENOSCOPY (EGD) WITH PROPOFOL;  Surgeon: Manya Silvas, MD;  Location: [facility] ENDOSCOPY;  Service: Endoscopy;  Laterality: N/A;   ESOPHAGOGASTRODUODENOSCOPY (EGD) WITH PROPOFOL N/A 10/27/2017   Procedure: ESOPHAGOGASTRODUODENOSCOPY (EGD) WITH PROPOFOL;  Surgeon: Manya Silvas, MD;  Location: [facility] ENDOSCOPY;  Service: Endoscopy;  Laterality: N/A;   ESOPHAGOGASTRODUODENOSCOPY (EGD) WITH PROPOFOL N/A 11/23/2020   Procedure: ESOPHAGOGASTRODUODENOSCOPY (EGD) WITH PROPOFOL;  Surgeon: Jonathon Bellows, MD;  Location: [facility] ENDOSCOPY;  Service: Gastroenterology;  Laterality: N/A;   INTRATHECAL PUMP IMPLANT Left 02/07/2020   Procedure: INTRATHECAL PUMP & CATHETER IMPLANT;  Surgeon: Deetta Perla, MD;  Location: [facility] ORS;  Service: Neurosurgery;  Laterality: Left;   LOWER EXTREMITY ANGIOGRAPHY Right 05/19/2017   Procedure: LOWER EXTREMITY ANGIOGRAPHY;  Surgeon: Algernon Huxley, MD;  Location: [place] CV LAB;  Service: Cardiovascular;  Laterality: Right;   LOWER EXTREMITY ANGIOGRAPHY Left 08/10/2018   Procedure: LOWER EXTREMITY ANGIOGRAPHY;  Surgeon: Algernon Huxley, MD;  Location: [place] CV LAB;  Service: Cardiovascular;  Laterality: Left;    HEMATOLOGY/ONCOLOGY HISTORY:  Oncology History Overview Note  # 2006- CLL STAGE IV; MAY 2011- WBC- 57K;Platelets-99;Hb-12/CT Bulky LN; BMBx- 80% Invol; del 11; START Benda-Ritux x4 [finished Sep 2011];   # July 2015-Progression; Sep 2015-START ibrutinib; CT scan DEC 2015- Improvement LN; Cont Ibrutinib 149m/d; NOV 2016 CT- 1-2CM LN [mild progression compared to  Dec 2015];NOV 2016- FISH peripheral blood- NO  MUTATIONS/CD-38 Positive; NOV 7th- CONT IBRUTINIB 2 pills/day; CT AUG 2017- STABLE;  DEC 6th PET- Mild RP LN/ Retrocrural LN  # OFF ibrutinib [? intol]- sep 2019- Jan 16th 2020; Re-start Ibrutinib; September 2020-stop ibrutinib [poor tolerance/worsening anemia]  # Jan 16th 2020- start aranesp; HOLD while on [place].   # MARCH 11th 2021- Gazyva x 6 finished AUG 2021.   #April May 2022-worsening anemia- BMBx- CLL; cytogenetics: 20 q del [incidental]; rFISH- 17p/11q DEL  # Early May 2022- START IBRUTINIB 420 mg/day  # SEP 2021- prostate enlargement on CT scan- UA-NEG; PSA- WNL; s/p Dr.Wolff.    # DEC 2019- PAIN CONTRACT  # PALLIATIVE CARE- 06/21/2019-  # October 2019-bone marrow biopsy [worsening anemia]-question dyserythropoietic changes/small clone of CLL;  # [facility] HEM- NEG.  SA skin infection [Oct 2016] s/p clinda -------------------------------------------------------    DIAGNOSIS: CLL  STAGE:   IV      ;GOALS: palliative  CURRENT/MOST RECENT THERAPY : Ibrutinib    CLL (chronic lymphocytic leukemia) ([place])  07/08/2019 -  Chemotherapy   The patient had obinutuzumab (GAZYVA) 100 mg in sodium chloride 0.9 % 100 mL (0.9615 mg/mL) chemo infusion, 100 mg, Intravenous, Once, 6 of 6 cycles Administration: 100 mg (07/08/2019), 900 mg (07/09/2019), 1,000 mg (07/26/2019), 1,000 mg (08/16/2019), 1,000 mg (08/02/2019), 1,000 mg (09/13/2019), 1,000 mg (10/11/2019), 1,000 mg (11/09/2019), 1,000 mg (12/07/2019)  for chemotherapy treatment.      ALLERGIES:  has No Known Allergies.  MEDICATIONS:  Current Facility-Administered Medications  Medication Dose Route Frequency Provider Last Rate Last Admin   0.9 %  sodium chloride infusion (Manually program via Guardrails IV Fluids)   Intravenous Once Dwyane Dee, MD       acetaminophen (TYLENOL) tablet 650 mg  650 mg Oral Q6H PRN Agbata, Tochukwu, MD       Or   acetaminophen (TYLENOL) suppository 650 mg  650 mg Rectal Q6H PRN Agbata, Tochukwu,  MD       feeding supplement (KATE FARMS STANDARD 1.4) liquid 325 mL  325 mL Oral TID BM Dwyane Dee, MD       folic acid (FOLVITE) tablet 1 mg  1 mg Oral Daily Agbata, Tochukwu, MD   1 mg at 12/19/20 0834   gabapentin (NEURONTIN) capsule 300 mg  300 mg Oral BID Agbata, Tochukwu, MD   300 mg at 12/19/20 4709   hydrocortisone (CORTEF) tablet 20 mg  20 mg Oral QPM Agbata, Tochukwu, MD   20 mg at 12/18/20 1721   hydrocortisone (CORTEF) tablet 30 mg  30 mg Oral Daily Agbata, Tochukwu, MD   30 mg at 12/19/20 6283   insulin aspart (novoLOG) injection 0-5 Units  0-5 Units Subcutaneous QHS Dwyane Dee, MD       insulin aspart (novoLOG) injection 0-6 Units  0-6 Units Subcutaneous TID WC Dwyane Dee, MD   1 Units at 12/19/20 1407   loperamide (IMODIUM) capsule 2 mg  2 mg Oral Q4H PRN Dwyane Dee, MD       multivitamin with minerals tablet 1 tablet  1 tablet Oral Daily Agbata, Tochukwu, MD   1 tablet at 12/19/20 0834   naloxegol oxalate (MOVANTIK) tablet 25 mg  25 mg Oral Daily Agbata, Tochukwu, MD   25 mg at 12/19/20 0834   ondansetron (ZOFRAN) tablet 4 mg  4 mg Oral Q6H PRN Agbata, Tochukwu, MD       Or   ondansetron (ZOFRAN) injection 4 mg  4 mg Intravenous  Q6H PRN Agbata, Tochukwu, MD       oxyCODONE (Oxy IR/ROXICODONE) immediate release tablet 10 mg  10 mg Oral Q6H PRN Athena Masse, MD   10 mg at 12/19/20 0836   pantoprazole (PROTONIX) EC tablet 40 mg  40 mg Oral QHS Agbata, Tochukwu, MD   40 mg at 12/18/20 2156    VITAL SIGNS: BP 138/75 (BP Location: Right Arm)   Pulse 65   Temp 97.8 F (36.6 C)   Resp 18   Ht $R'5\' 8"'Eu$  (1.727 m)   Wt 110 lb 3.7 oz (50 kg)   SpO2 100%   BMI 16.76 kg/m  Filed Weights   12/15/20 1450  Weight: 110 lb 3.7 oz (50 kg)    Estimated body mass index is 16.76 kg/m as calculated from the following:   Height as of this encounter: $RemoveBeforeD'5\' 8"'PuRAAPRXduAQUw$  (1.727 m).   Weight as of this encounter: 110 lb 3.7 oz (50 kg).  LABS: CBC:    Component Value Date/Time   WBC  7.9 12/19/2020 0520   HGB 7.8 (L) 12/19/2020 0520   HGB 11.4 (L) 08/09/2014 1122   HCT 23.6 (L) 12/19/2020 0520   HCT 35.6 (L) 08/09/2014 1122   PLT 108 (L) 12/19/2020 0520   PLT 95 (L) 08/09/2014 1122   MCV 100.4 (H) 12/19/2020 0520   MCV 100 08/09/2014 1122   NEUTROABS 1.5 (L) 12/19/2020 0520   NEUTROABS 4.3 08/09/2014 1122   LYMPHSABS 5.9 (H) 12/19/2020 0520   LYMPHSABS 6.5 (H) 08/09/2014 1122   MONOABS 0.5 12/19/2020 0520   MONOABS 1.1 (H) 08/09/2014 1122   EOSABS 0.0 12/19/2020 0520   EOSABS 0.1 08/09/2014 1122   BASOSABS 0.1 12/19/2020 0520   BASOSABS 0.1 08/09/2014 1122   Comprehensive Metabolic Panel:    Component Value Date/Time   NA 142 12/19/2020 0520   NA 142 05/03/2014 1139   K 3.9 12/19/2020 0520   K 4.7 05/03/2014 1139   CL 105 12/19/2020 0520   CL 110 (H) 05/03/2014 1139   CO2 31 12/19/2020 0520   CO2 24 05/03/2014 1139   BUN 17 12/19/2020 0520   BUN 20 (H) 05/03/2014 1139   CREATININE 0.84 12/19/2020 0520   CREATININE 1.22 08/09/2014 1122   GLUCOSE 110 (H) 12/19/2020 0520   GLUCOSE 98 05/03/2014 1139   CALCIUM 8.4 (L) 12/19/2020 0520   CALCIUM 8.6 05/03/2014 1139   AST 27 12/19/2020 0520   AST 15 05/03/2014 1139   ALT 33 12/19/2020 0520   ALT 26 05/03/2014 1139   ALKPHOS 59 12/19/2020 0520   ALKPHOS 94 05/03/2014 1139   BILITOT 0.7 12/19/2020 0520   BILITOT 0.5 05/03/2014 1139   PROT 5.0 (L) 12/19/2020 0520   PROT 7.5 05/03/2014 1139   ALBUMIN 3.0 (L) 12/19/2020 0520   ALBUMIN 4.1 05/03/2014 1139    RADIOGRAPHIC STUDIES: CT ABDOMEN PELVIS W CONTRAST  Result Date: 11/30/2020 CLINICAL DATA:  Epigastric pain for 5 days.  Generalized weakness EXAM: CT ABDOMEN AND PELVIS WITH CONTRAST TECHNIQUE: Multidetector CT imaging of the abdomen and pelvis was performed using the standard protocol following bolus administration of intravenous contrast. CONTRAST:  52mL OMNIPAQUE IOHEXOL 350 MG/ML SOLN COMPARISON:  11/21/2020 FINDINGS: Lower chest: New mixed  density airspace opacity within the left lower lobe. Right lung bases clear. Heart size is normal. Hepatobiliary: Status post cholecystectomy. Similar degree of intrahepatic biliary dilatation. No focal liver lesion is identified. Pancreas: Unremarkable. No pancreatic ductal dilatation or surrounding inflammatory changes. Spleen: Spleen is upper  limits of normal in size without focal lesion. Adrenals/Urinary Tract: Unremarkable adrenal glands. Stable bilateral renal cysts. Two tiny right-sided renal stones without hydronephrosis. No left-sided renal stone or hydronephrosis. Urinary bladder is mildly distended but appears otherwise unremarkable. Stomach/Bowel: Stomach is within normal limits. Appendix appears normal. No evidence of bowel wall thickening, distention, or inflammatory changes. Vascular/Lymphatic: Advanced atherosclerosis throughout the aortoiliac axis with bi-iliac stents. No aneurysm. Similar degree of retroperitoneal lymphadenopathy. Reference left para-aortic nodal mass measuring approximately 4.3 x 2.4 cm (series 3, image 36), unchanged. Reproductive: Prostatomegaly. Other: No free fluid. No abdominopelvic fluid collection. No pneumoperitoneum. No abdominal wall hernia. Musculoskeletal: No acute or significant osseous findings. Neurostimulator is again seen entering at the L3-4 level and extending into the thoracic region, beyond the field of view. IMPRESSION: 1. New airspace opacity within the left lower lobe compatible with pneumonia. 2. Stable retroperitoneal lymphadenopathy. 3. Nonobstructing right nephrolithiasis. 4. Prostatomegaly. 5. Aortic atherosclerosis (ICD10-I70.0). Electronically Signed   By: Davina Poke D.O.   On: 11/30/2020 15:31   CT ABDOMEN PELVIS W CONTRAST  Result Date: 11/21/2020 CLINICAL DATA:  Acute nonlocalized epigastric pain. Weakness and lethargy. Question perforation. Being treated for leukemia. EXAM: CT ABDOMEN AND PELVIS WITH CONTRAST TECHNIQUE: Multidetector CT  imaging of the abdomen and pelvis was performed using the standard protocol following bolus administration of intravenous contrast. CONTRAST:  65mL OMNIPAQUE IOHEXOL 350 MG/ML SOLN COMPARISON:  01/24/2020 FINDINGS: Lower chest: Lung bases are clear.  No pleural or pericardial fluid. Hepatobiliary: Previous cholecystectomy. No significant liver parenchymal finding. Mild ductal prominence, within normal limits following cholecystectomy. Pancreas: Normal Spleen: Spleen upper limits of normal in size.  No focal lesion. Adrenals/Urinary Tract: Adrenal glands are normal. There are bilateral renal cysts. No evidence of mass or hydronephrosis. One or 2 punctate nonobstructing stones in the lower pole of the right kidney. Stomach/Bowel: Stomach is normal. Small bowel is normal. Moderate amount of gas and fecal matter in the colon without evidence of inflammatory disease or frank obstruction. Normal appearing appendix. Vascular/Lymphatic: Aortic atherosclerosis. No aneurysm. IVC is normal. Retroperitoneal adenopathy is similar to the previous study, the largest nodal mass on the left at the level of the kidney measuring 4.2 x 2.8 cm. Other retroperitoneal nodes similarly stable. Reproductive: Enlarged prostate. Other: No free fluid or air. Musculoskeletal: No fracture or focal bone lesion. Neurostimulator in place, entering at the L3-4 level and extending into the thoracic region. IMPRESSION: No evidence of acute bowel pathology. No evidence of perforation as questioned clinically. Moderate amount of gas and fecal matter in the colon, possibly due to opioid use. Spleen upper limits of normal in size. Retroperitoneal lymphadenopathy, similar to the study of September 2021. Enlarged prostate. Aortic Atherosclerosis (ICD10-I70.0). Electronically Signed   By: Nelson Chimes M.D.   On: 11/21/2020 15:26   DG Chest Port 1 View  Result Date: 12/15/2020 CLINICAL DATA:  Follow-up pneumonia.  Syncope.  History of leukemia EXAM:  PORTABLE CHEST 1 VIEW COMPARISON:  11/30/2020 FINDINGS: Normal heart size. Atherosclerotic calcification of the aortic knob. Interval resolution of previously seen left lower lobe pneumonia. No focal airspace consolidation, pleural effusion, or pneumothorax. IMPRESSION: No acute cardiopulmonary abnormality. Electronically Signed   By: Davina Poke D.O.   On: 12/15/2020 11:24   DG Chest Port 1 View  Result Date: 11/30/2020 CLINICAL DATA:  Questionable sepsis. EXAM: PORTABLE CHEST 1 VIEW COMPARISON:  Chest x-ray 11/21/2020, CT chest 01/24/2020 FINDINGS: The heart size and mediastinal contours are unchanged. Aortic calcification. Interval development of patchy airspace  opacity of the left lower lobe. No pulmonary edema. No pleural effusion. No pneumothorax. No acute osseous abnormality. Degenerative changes of bilateral acromioclavicular joints. IMPRESSION: Left lower lobe pneumonia/aspiration pneumonia Followup PA and lateral chest X-ray is recommended in 3-4 weeks following therapy to ensure resolution and exclude underlying malignancy. Electronically Signed   By: Iven Finn M.D.   On: 11/30/2020 15:34   DG Chest Port 1 View  Result Date: 11/21/2020 CLINICAL DATA:  Questionable sepsis. Weakness. Lethargy. Being treated for leukemia. EXAM: PORTABLE CHEST 1 VIEW COMPARISON:  09/12/2019 FINDINGS: The heart size and mediastinal contours are within normal limits. Ordinary age related aortic atherosclerotic calcification. Both lungs are clear. The visualized skeletal structures are unremarkable. IMPRESSION: No active disease. Electronically Signed   By: Nelson Chimes M.D.   On: 11/21/2020 15:20    PERFORMANCE STATUS (ECOG) : 2 - Symptomatic, <50% confined to bed  Review of Systems Unless otherwise noted, a complete review of systems is negative.  Physical Exam General: NAD Cardiovascular: regular rate and rhythm Pulmonary: Unlabored Extremities: no edema, no joint deformities Skin: no  rashes Neurological: Weakness but otherwise nonfocal  IMPRESSION: Follow-up visit.    Patient denies any acute symptoms or concerns.  Again discussed patient's goals and plans for disposition.  He confirms his intent to move to [place] to live with his son.  He thinks he might return home first prior to his son picking him up this weekend.  I called and spoke with his son.  Son is currently searching for medical care in [place] including oncology.  I also suggested that he locate a pain provider that can manage patient's intrathecal pain pump.  Son confirms that they are not currently interested in hospice and plan to continue seeking cancer/medical care after patient moves to [place]: -Continue current scope of treatment -Patient planning to move to [place] and continue seeking cancer treatment   Case and plan discussed with Dr. Rogue Bussing  Time Total: 20 minutes  Visit consisted of counseling and education dealing with the complex and emotionally intense issues of symptom management and palliative care in the setting of serious and potentially life-threatening illness.Greater than 50%  of this time was spent counseling and coordinating care related to the above assessment and plan.  Signed by: Altha Harm, PhD, NP-C

## 2020-12-19 NOTE — Evaluation (Addendum)
Occupational Therapy Evaluation Patient Details Name: James [name] MRN: [id number] DOB: 15-Jan-1951 Today's Date: 12/19/2020    History of Present Illness 70 year old male with PMH stage IV CLL, depression, DM II, hypertension, hyperlipidemia, GERD who presented to the hospital with generalized weakness. He has had worsening oral intake and lightheadedness/dizziness at home, but denies any falls. He was recently hospitalized earlier in August for CAP.   Clinical Impression   Pt seen for OT evaluation this date. At baseline, pt is mod-independent in all ADLs and functional mobility with rollator, living in a 1-story home alone. Per chart review, neighbor and friend assist with IADLs (bringing food, doing laundry, mowing the yard, and going to doctor appointments). Pt reports that he and family are discussing whether or not he should move to [place] to live with son. Pt currently presents with decreased strength and activity tolerance. Due to these functional impairments, pt requires SET-UP assist for seated UB ADLs, SUPERVISION for seated LB dressing, SUPERVISION for functional mobility of short household distances with RW, and SUPERVISION for [facility] transfers. At end of session, pt left seated in recliner with physical therapy. Pt would benefit from additional skilled OT services to maximize return to PLOF and minimize risk of future falls, injury, caregiver burden, and readmission. Upon discharge, recommend [place] services.    Follow Up Recommendations  Home health OT;Supervision/Assistance - 24 hour    Equipment Recommendations  None recommended by OT       Precautions / Restrictions Precautions Precautions: Fall Restrictions Weight Bearing Restrictions: No      Mobility Bed Mobility Overal bed mobility: Needs Assistance Bed Mobility: Supine to Sit     Supine to sit: Modified independent (Device/Increase time);HOB elevated     General bed mobility comments: Able to perform with ease     Transfers Overall transfer level: Needs assistance Equipment used: Rolling walker (2 wheeled) Transfers: Sit to/from Stand Sit to Stand: Supervision         General transfer comment: cues for hand placement    Balance Overall balance assessment: Needs assistance Sitting-balance support: No upper extremity supported;Feet supported Sitting balance-Leahy Scale: Good Sitting balance - Comments: good dynamic sitting balance at EOB   Standing balance support: Bilateral upper extremity supported;During functional activity Standing balance-Leahy Scale: Good Standing balance comment: SUPERVISION in setting of pt c/o of mild dizziness following initial change in position                           ADL either performed or assessed with clinical judgement   ADL Overall ADL's : Needs assistance/impaired                 Upper Body Dressing : Set up;Sitting Upper Body Dressing Details (indicate cue type and reason): to don/doff hospital gown. Able to untie gown independently Lower Body Dressing: Set up;Supervision/safety;Sitting/lateral leans Lower Body Dressing Details (indicate cue type and reason): to don/doff socks Toilet Transfer: Supervision/safety;Set up;Ambulation;BSC;RW           Functional mobility during ADLs: Supervision/safety;Rolling walker        Pertinent Vitals/Pain Pain Assessment: No/denies pain        Extremity/Trunk Assessment Upper Extremity Assessment Upper Extremity Assessment: Overall WFL for tasks assessed   Lower Extremity Assessment Lower Extremity Assessment: Generalized weakness       Communication Communication Communication: No difficulties (unsure if pt has poor memory or word finding issues)   Cognition Arousal/Alertness: Awake/alert Behavior During Therapy: [facility]  for tasks assessed/performed Overall Cognitive Status: Within Functional Limits for tasks assessed                                      General Comments  Initallly reported light headedness upon sitting, decreased with time and mobility            Home Living Family/patient expects to be discharged to:: Private residence Living Arrangements: Alone Available Help at Discharge: Friend(s);Available PRN/intermittently;Neighbor (Son lives in [place]. Pt and son are discussing having pt live with son) Type of Home: House Home Access: Stairs to enter [employer] of Steps: 2 Entrance Stairs-Rails: Right;Left (wideset) Home Layout: One level     Bathroom Shower/Tub: Tub/shower unit;Other (comment) (spongebathes at baseline)         Home Equipment: Walker - 4 wheels          Prior Functioning/Environment Level of Independence: Needs assistance  Gait / Transfers Assistance Needed: Using rollator for household mobility ADL's / Homemaking Assistance Needed: Pt reports he is is independent with ADLs. Per chart review, neighbor and friend assists with IADLs (bringing food, doing laundry, mowing the yard, and going to doctor appointments).            OT Problem List: Decreased strength;Pain;Decreased cognition;Decreased safety awareness;Decreased activity tolerance;Impaired balance (sitting and/or standing);Decreased knowledge of use of DME or AE      OT Treatment/Interventions: Self-care/ADL training;Therapeutic exercise;Therapeutic activities;DME and/or AE instruction;Energy conservation;Patient/family education;Balance training;Cognitive remediation/compensation    OT Goals(Current goals can be found in the care plan section) Acute Rehab OT Goals Patient Stated Goal: go home soon OT Goal Formulation: With patient Time For Goal Achievement: 01/02/21 Potential to Achieve Goals: Good ADL Goals Pt Will Perform Grooming: with modified independence;standing Pt Will Transfer to Toilet: with modified independence;ambulating;regular height toilet Pt Will Perform Toileting - Clothing Manipulation and hygiene: with  modified independence;sitting/lateral leans  OT Frequency: Min 2X/week    AM-PAC OT "6 Clicks" Daily Activity     Outcome Measure Help from another person eating meals?: None Help from another person taking care of personal grooming?: A Little Help from another person toileting, which includes using toliet, bedpan, or urinal?: A Little Help from another person bathing (including washing, rinsing, drying)?: A Little Help from another person to put on and taking off regular upper body clothing?: A Little Help from another person to put on and taking off regular lower body clothing?: A Little 6 Click Score: 19   End of Session Equipment Utilized During Treatment: Rolling walker Nurse Communication: Mobility status  Activity Tolerance: Patient tolerated treatment well Patient left: in chair;with call bell/phone within reach;Other (comment) (with physical therapy)  OT Visit Diagnosis: Muscle weakness (generalized) (M62.81);History of falling (Z91.81)                Time: 4742-5956 OT Time Calculation (min): 23 min Charges:  OT General Charges $OT Visit: 1 Visit OT Evaluation $OT Eval Moderate Complexity: 1 Mod OT Treatments $Self Care/Home Management : 8-22 mins  Fredirick Maudlin, OTR/L [place]

## 2020-12-19 NOTE — TOC Progression Note (Signed)
Transition of Care [facility]) - Progression Note    Patient Details  Name: James [name] MRN: [id number] Date of Birth: [birth year]  Transition of Care [facility] CM/SW [employer], RN Phone Number: 12/19/2020, 3:03 PM  Clinical Narrative:   RNCM spoke to son, [name] today.  [name] states he has spoken to his father and he and his father agree that patient should move to [place], [place] to live with [name].  [name] plans to transport his father to [place] this coming weekend.  Patient's son is looking into an oncologist in that area, and states the nearest hospital is [facility].  Patient states he has spoken to MD, but not yet spoken to palliative care.  Message left for palliative care to call patient's son.  TOC contact information given, TOC to follow to discharge.       Expected Discharge Plan and Services                                                 Social Determinants of Health (SDOH) Interventions    Readmission Risk Interventions Readmission Risk Prevention Plan 12/19/2020 12/01/2020  Transportation Screening Complete Complete  Medication Review [employer]) Complete Complete  PCP or Specialist appointment within 3-5 days of discharge Complete Complete  HRI or Home Care Consult Complete Complete  SW Recovery Care/Counseling Consult Not Complete Complete  SW Consult Not Complete Comments RNCM assigned to patient -  Palliative Care Screening Complete Not [place] - Not Applicable  Some recent data might be hidden

## 2020-12-20 ENCOUNTER — Other Ambulatory Visit (INDEPENDENT_AMBULATORY_CARE_PROVIDER_SITE_OTHER): Payer: Self-pay | Admitting: Vascular Surgery

## 2020-12-20 LAB — CULTURE, BLOOD (SINGLE): Culture: NO GROWTH

## 2020-12-20 LAB — GLUCOSE, CAPILLARY: Glucose-Capillary: 104 mg/dL — ABNORMAL HIGH (ref 70–99)

## 2020-12-20 MED ORDER — GABAPENTIN 300 MG PO CAPS: 300.0000 mg | ORAL_CAPSULE | Freq: Two times a day (BID) | ORAL | 1 refills | Status: AC

## 2020-12-20 NOTE — Progress Notes (Signed)
LASTER APPLING   DOB:1950/12/16   NW#:[id number]    Subjective: No acute events overnight.  No nausea or vomiting.  No blood in stools or black or stools.  Continues to have mild shortness of breath on exertion.  No worsening abdominal pain.  Objective:  Vitals:   12/20/20 0532 12/20/20 0738  BP: 122/67 133/76  Pulse: (!) 55 60  Resp: 18 16  Temp: (!) 97.3 F (36.3 C) 98 F (36.7 C)  SpO2: 100% 99%     Intake/Output Summary (Last 24 hours) at 12/20/2020 2304 Last data filed at 12/20/2020 0033 Gross per 24 hour  Intake --  Output 400 ml  Net -400 ml    Physical Exam Vitals and nursing note reviewed.  Constitutional:      Comments: Frail-appearing male patient pale.  No acute distress.  Ambulating independently.  HENT:     Head: Normocephalic and atraumatic.     Mouth/Throat:     Pharynx: Oropharynx is clear.  Eyes:     Extraocular Movements: Extraocular movements intact.     Pupils: Pupils are equal, round, and reactive to light.  Cardiovascular:     Rate and Rhythm: Normal rate and regular rhythm.  Pulmonary:     Comments: Decreased breath sounds bilaterally.  Abdominal:     Palpations: Abdomen is soft.  Musculoskeletal:        General: Normal range of motion.     Cervical back: Normal range of motion.  Skin:    General: Skin is warm.  Neurological:     General: No focal deficit present.     Mental Status: He is alert and oriented to person, place, and time.  Psychiatric:        Behavior: Behavior normal.        Judgment: Judgment normal.     Labs:  Lab Results  Component Value Date   WBC 7.9 12/19/2020   HGB 7.8 (L) 12/19/2020   HCT 23.6 (L) 12/19/2020   MCV 100.4 (H) 12/19/2020   PLT 108 (L) 12/19/2020   NEUTROABS 1.5 (L) 12/19/2020    Lab Results  Component Value Date   NA 142 12/19/2020   K 3.9 12/19/2020   CL 105 12/19/2020   CO2 31 12/19/2020    Studies:  No results found.  CLL (chronic lymphocytic leukemia) ([place]) #70 year old male  patient with a history of CLL/relapsed bone marrow/11 P deletion 7 q. Deletion/ chronic abdominal pain of unclear etiology-s/p pain pump; question adrenal insufficiency is currently admitted to hospital for worsening generalized weakness.   #Symptomatic severe on the weekend.; suspect predominant CLL/SLL involving the bone marrow BX April 2022 bone marrow biopsy. July 2022- EGD shows multiple AV malformations stomach status post argon laser.  Hb 6.5.  S/p PRBC transfusion hemoglobin 7.8. Hold off IV iron as iron studies are adequate.     #Abdominal pain chronic-s/p evaluation with pain clinic s/p pain pump; question adrenal insufficiency currently on hydrocortisone.    # DNR/DNI  #After lengthy discussions over the last few days-with patient and son, [name] are in agreement to continue current scope of therapy/and if patient's performance status improves-consider treatment for CLL.  Patient/son declined hospice services.  As per the patient, [name] arriving from [place] on 8/26-with the plan to relocate to [place] over the weekend.  Discussed importance of establishing care ASAP locally-however given the patient's "fragile clinical status"; and multiple other comorbidities [pain pump for abdominal pain]; adrenal sufficiency is quite possible that patient might have to go  to the emergency room when sick-establish care.  I waiting to hear from son regarding referral information.  Discussed with Dr. Avon Gully; also discussed with Billey Chang, palliative care.   Cammie Sickle, MD 12/20/2020  11:04 PM

## 2020-12-20 NOTE — Care Management Important Message (Signed)
Important Message  Patient Details  Name: James [name] MRN: [id number] Date of Birth: Nov 08, 1950   Medicare Important Message Given:  Other (see comment)  Patient is in an isolation room so I called his room [phone]) but there was no answer.  Tried calling son, Vladislav Axelson ([phone]) with whom he plans to reside with but no answer.  Will try again.  Juliann Pulse A Scott Vanderveer 12/20/2020, 9:53 AM

## 2020-12-20 NOTE — Discharge Summary (Signed)
Physician Discharge Summary  James [name] JOA:[id number] DOB: 1950/08/28 DOA: 12/15/2020  PCP: [name], MD  Admit date: 12/15/2020 Discharge date: 12/20/2020  Admitted From: Home Disposition: Home with son  Recommendations for Outpatient Follow-up:  Follow up with PCP in 1-2 weeks Please obtain BMP/CBC in one week  Home Health: None Equipment/Devices: None  Discharge Condition: Stable CODE STATUS: DNR Diet recommendation: As tolerated  Brief/Interim Summary: James [name] is a 70 year old male with PMH stage IV CLL, depression, DM II, hypertension, hyperlipidemia, GERD who presented to the hospital with generalized weakness.  He was recently hospitalized earlier in August for CAP. In general he has had an overall progressive decline with recurrent illnesses and having difficulty recovering from each per oncology. He has had worsening oral intake and associated lightheadedness/dizziness at home. He is admitted for further evaluation with oncology and evaluation by palliative care with consideration for pursuing hospice.    Failure to thrive in adult, improving - History of stage IV CLL (dx 2006) -Ongoing decline, has had recurrent hospitalizations and has been referred to [facility] for chronic idiopathic abdominal pain.  He also has an intrathecal pain pump placed on 02/07/2020. -Oncology recommending palliative care discussion, discharge with supportive care outpatient, moving in with his son in [place]  AKI (acute kidney injury) ([place]), RESOLVED -Likely prerenal given poor p.o. intake, improving with IV fluids and advancing diet   Symptomatic macrocytic anemia - MCV 103, Hgb 6.5 g/dL on admission - Status post transfusion 8/21 x 1 - Stable  Diarrhea - GI panel positive for Norovirus -Continue supportive care  Diabetes mellitus type 2, uncomplicated (HCC) - S0F 0.9% on 11/30/2020 -Continue to follow outpatient, increase p.o. intake as discussed   Abdominal wall pain in  epigastric region - Pain pump in place in left lower abdomen.   CLL (chronic lymphocytic leukemia) ([address]) - see failure to thrive    Discharge Instructions  Discharge Instructions     Diet Carb Modified   Complete by: As directed    Increase activity slowly   Complete by: As directed       Allergies as of 12/20/2020   No Known Allergies      Medication List     STOP taking these medications    dexamethasone 2 MG tablet Commonly known as: DECADRON       TAKE these medications    AMBULATORY NON FORMULARY MEDICATION Medication Name: Intrathecal pump Bupivacaine 20.0 mg/ml Fentanyl 2,000.0 mcg/ml Dose 1846.4 mcg/day   Ensure Take 237 mLs by mouth 3 (three) times daily between meals.   folic acid 1 MG tablet Commonly known as: FOLVITE Take 1 tablet (1 mg total) by mouth daily.   gabapentin 300 MG capsule Commonly known as: Neurontin Take 1 capsule (300 mg total) by mouth 2 (two) times daily. What changed: See the new instructions.   hydrocortisone 10 MG tablet Commonly known as: CORTEF Take 3 tablets (30 mg total) by mouth daily. What changed: Another medication with the same name was removed. Continue taking this medication, and follow the directions you see here.   hydrocortisone 20 MG tablet Commonly known as: CORTEF Take 1 tablet (20 mg total) by mouth every evening. What changed: Another medication with the same name was removed. Continue taking this medication, and follow the directions you see here.   metFORMIN 1000 MG tablet Commonly known as: GLUCOPHAGE Take 1 tablet (1,000 mg total) by mouth daily with breakfast.   multivitamin with minerals Tabs tablet Take 1 tablet by  mouth daily.   naloxegol oxalate 25 MG Tabs tablet Commonly known as: Movantik Take 1 tablet (25 mg total) by mouth daily.   naloxone 2 MG/2ML injection Commonly known as: NARCAN Inject 1 mL (1 mg total) into the muscle as needed for up to 2 doses (for opioid overdose). In  case of emergency (overdose), inject into muscle of upper arm or leg and call 911.   Oxycodone HCl 10 MG Tabs Take 1 tablet (10 mg total) by mouth every 4 (four) hours as needed. Must last 30 days Start taking on: January 07, 2021   pantoprazole 40 MG tablet Commonly known as: PROTONIX Take 40 mg by mouth at bedtime.        No Known Allergies  Consultations: Oncology  Procedures/Studies: CT ABDOMEN PELVIS W CONTRAST  Result Date: 11/30/2020 CLINICAL DATA:  Epigastric pain for 5 days.  Generalized weakness EXAM: CT ABDOMEN AND PELVIS WITH CONTRAST TECHNIQUE: Multidetector CT imaging of the abdomen and pelvis was performed using the standard protocol following bolus administration of intravenous contrast. CONTRAST:  24mL OMNIPAQUE IOHEXOL 350 MG/ML SOLN COMPARISON:  11/21/2020 FINDINGS: Lower chest: New mixed density airspace opacity within the left lower lobe. Right lung bases clear. Heart size is normal. Hepatobiliary: Status post cholecystectomy. Similar degree of intrahepatic biliary dilatation. No focal liver lesion is identified. Pancreas: Unremarkable. No pancreatic ductal dilatation or surrounding inflammatory changes. Spleen: Spleen is upper limits of normal in size without focal lesion. Adrenals/Urinary Tract: Unremarkable adrenal glands. Stable bilateral renal cysts. Two tiny right-sided renal stones without hydronephrosis. No left-sided renal stone or hydronephrosis. Urinary bladder is mildly distended but appears otherwise unremarkable. Stomach/Bowel: Stomach is within normal limits. Appendix appears normal. No evidence of bowel wall thickening, distention, or inflammatory changes. Vascular/Lymphatic: Advanced atherosclerosis throughout the aortoiliac axis with bi-iliac stents. No aneurysm. Similar degree of retroperitoneal lymphadenopathy. Reference left para-aortic nodal mass measuring approximately 4.3 x 2.4 cm (series 3, image 36), unchanged. Reproductive: Prostatomegaly. Other:  No free fluid. No abdominopelvic fluid collection. No pneumoperitoneum. No abdominal wall hernia. Musculoskeletal: No acute or significant osseous findings. Neurostimulator is again seen entering at the L3-4 level and extending into the thoracic region, beyond the field of view. IMPRESSION: 1. New airspace opacity within the left lower lobe compatible with pneumonia. 2. Stable retroperitoneal lymphadenopathy. 3. Nonobstructing right nephrolithiasis. 4. Prostatomegaly. 5. Aortic atherosclerosis (ICD10-I70.0). Electronically Signed   By: Davina Poke D.O.   On: 11/30/2020 15:31   CT ABDOMEN PELVIS W CONTRAST  Result Date: 11/21/2020 CLINICAL DATA:  Acute nonlocalized epigastric pain. Weakness and lethargy. Question perforation. Being treated for leukemia. EXAM: CT ABDOMEN AND PELVIS WITH CONTRAST TECHNIQUE: Multidetector CT imaging of the abdomen and pelvis was performed using the standard protocol following bolus administration of intravenous contrast. CONTRAST:  4mL OMNIPAQUE IOHEXOL 350 MG/ML SOLN COMPARISON:  01/24/2020 FINDINGS: Lower chest: Lung bases are clear.  No pleural or pericardial fluid. Hepatobiliary: Previous cholecystectomy. No significant liver parenchymal finding. Mild ductal prominence, within normal limits following cholecystectomy. Pancreas: Normal Spleen: Spleen upper limits of normal in size.  No focal lesion. Adrenals/Urinary Tract: Adrenal glands are normal. There are bilateral renal cysts. No evidence of mass or hydronephrosis. One or 2 punctate nonobstructing stones in the lower pole of the right kidney. Stomach/Bowel: Stomach is normal. Small bowel is normal. Moderate amount of gas and fecal matter in the colon without evidence of inflammatory disease or frank obstruction. Normal appearing appendix. Vascular/Lymphatic: Aortic atherosclerosis. No aneurysm. IVC is normal. Retroperitoneal adenopathy is similar to  the previous study, the largest nodal mass on the left at the level of  the kidney measuring 4.2 x 2.8 cm. Other retroperitoneal nodes similarly stable. Reproductive: Enlarged prostate. Other: No free fluid or air. Musculoskeletal: No fracture or focal bone lesion. Neurostimulator in place, entering at the L3-4 level and extending into the thoracic region. IMPRESSION: No evidence of acute bowel pathology. No evidence of perforation as questioned clinically. Moderate amount of gas and fecal matter in the colon, possibly due to opioid use. Spleen upper limits of normal in size. Retroperitoneal lymphadenopathy, similar to the study of September 2021. Enlarged prostate. Aortic Atherosclerosis (ICD10-I70.0). Electronically Signed   By: Nelson Chimes M.D.   On: 11/21/2020 15:26   DG Chest Port 1 View  Result Date: 12/15/2020 CLINICAL DATA:  Follow-up pneumonia.  Syncope.  History of leukemia EXAM: PORTABLE CHEST 1 VIEW COMPARISON:  11/30/2020 FINDINGS: Normal heart size. Atherosclerotic calcification of the aortic knob. Interval resolution of previously seen left lower lobe pneumonia. No focal airspace consolidation, pleural effusion, or pneumothorax. IMPRESSION: No acute cardiopulmonary abnormality. Electronically Signed   By: Davina Poke D.O.   On: 12/15/2020 11:24   DG Chest Port 1 View  Result Date: 11/30/2020 CLINICAL DATA:  Questionable sepsis. EXAM: PORTABLE CHEST 1 VIEW COMPARISON:  Chest x-ray 11/21/2020, CT chest 01/24/2020 FINDINGS: The heart size and mediastinal contours are unchanged. Aortic calcification. Interval development of patchy airspace opacity of the left lower lobe. No pulmonary edema. No pleural effusion. No pneumothorax. No acute osseous abnormality. Degenerative changes of bilateral acromioclavicular joints. IMPRESSION: Left lower lobe pneumonia/aspiration pneumonia Followup PA and lateral chest X-ray is recommended in 3-4 weeks following therapy to ensure resolution and exclude underlying malignancy. Electronically Signed   By: Iven Finn M.D.   On:  11/30/2020 15:34   DG Chest Port 1 View  Result Date: 11/21/2020 CLINICAL DATA:  Questionable sepsis. Weakness. Lethargy. Being treated for leukemia. EXAM: PORTABLE CHEST 1 VIEW COMPARISON:  09/12/2019 FINDINGS: The heart size and mediastinal contours are within normal limits. Ordinary age related aortic atherosclerotic calcification. Both lungs are clear. The visualized skeletal structures are unremarkable. IMPRESSION: No active disease. Electronically Signed   By: Nelson Chimes M.D.   On: 11/21/2020 15:20     Subjective: No acute issues or events overnight denies nausea vomiting diarrhea constipation headache fevers chills or chest pain   Discharge Exam: Vitals:   12/20/20 0532 12/20/20 0738  BP: 122/67 133/76  Pulse: (!) 55 60  Resp: 18 16  Temp: (!) 97.3 F (36.3 C) 98 F (36.7 C)  SpO2: 100% 99%   Vitals:   12/19/20 1952 12/20/20 0128 12/20/20 0532 12/20/20 0738  BP: (!) 138/94 (!) 133/58 122/67 133/76  Pulse: 90 61 (!) 55 60  Resp: 16 18 18 16   Temp: 98.7 F (37.1 C) 98.4 F (36.9 C) (!) 97.3 F (36.3 C) 98 F (36.7 C)  TempSrc:    Oral  SpO2: 96% 100% 100% 99%  Weight:      Height:        General: Pt is alert, awake, not in acute distress Cardiovascular: RRR, S1/S2 +, no rubs, no gallops Respiratory: CTA bilaterally, no wheezing, no rhonchi Abdominal: Soft, NT, ND, bowel sounds + Extremities: no edema, no cyanosis   The results of significant diagnostics from this hospitalization (including imaging, microbiology, ancillary and laboratory) are listed below for reference.     Microbiology: Recent Results (from the past 240 hour(s))  Blood culture (single)  Status: None   Collection Time: 12/15/20 10:18 AM   Specimen: BLOOD  Result Value Ref Range Status   Specimen Description BLOOD BLOOD LEFT FOREARM  Final   Special Requests   Final    BOTTLES DRAWN AEROBIC AND ANAEROBIC Blood Culture results may not be optimal due to an inadequate volume of blood  received in culture bottles   Culture   Final    NO GROWTH 5 DAYS Performed at [facility], [address] [place], [place] [address]    Report Status 12/20/2020 FINAL  Final  Resp Panel by RT-PCR (Flu A&B, Covid) Nasopharyngeal Swab     Status: None   Collection Time: 12/15/20 12:04 PM   Specimen: Nasopharyngeal Swab; Nasopharyngeal(NP) swabs in vial transport medium  Result Value Ref Range Status   SARS Coronavirus 2 by RT PCR NEGATIVE NEGATIVE Final    Comment: (NOTE) SARS-CoV-2 target nucleic acids are NOT DETECTED.  The SARS-CoV-2 RNA is generally detectable in upper respiratory specimens during the acute phase of infection. The lowest concentration of SARS-CoV-2 viral copies this assay can detect is 138 copies/mL. A negative result does not preclude SARS-Cov-2 infection and should not be used as the sole basis for treatment or other patient management decisions. A negative result may occur with  improper specimen collection/handling, submission of specimen other than nasopharyngeal swab, presence of viral mutation(s) within the areas targeted by this assay, and inadequate number of viral copies(<138 copies/mL). A negative result must be combined with clinical observations, patient history, and epidemiological information. The expected result is Negative.  Fact Sheet for Patients:  EntrepreneurPulse.com.au  Fact Sheet for Healthcare Providers:  IncredibleEmployment.be  This test is no t yet approved or cleared by the [place] FDA and  has been authorized for detection and/or diagnosis of SARS-CoV-2 by FDA under an Emergency Use Authorization (EUA). This EUA will remain  in effect (meaning this test can be used) for the duration of the COVID-19 declaration under Section 564(b)(1) of the Act, 21 U.S.C.section 360bbb-3(b)(1), unless the authorization is terminated  or revoked sooner.       Influenza A by PCR NEGATIVE  NEGATIVE Final   Influenza B by PCR NEGATIVE NEGATIVE Final    Comment: (NOTE) The Xpert Xpress SARS-CoV-2/FLU/RSV plus assay is intended as an aid in the diagnosis of influenza from Nasopharyngeal swab specimens and should not be used as a sole basis for treatment. Nasal washings and aspirates are unacceptable for Xpert Xpress SARS-CoV-2/FLU/RSV testing.  Fact Sheet for Patients: EntrepreneurPulse.com.au  Fact Sheet for Healthcare Providers: IncredibleEmployment.be  This test is not yet approved or cleared by the [place] FDA and has been authorized for detection and/or diagnosis of SARS-CoV-2 by FDA under an Emergency Use Authorization (EUA). This EUA will remain in effect (meaning this test can be used) for the duration of the COVID-19 declaration under Section 564(b)(1) of the Act, 21 U.S.C. section 360bbb-3(b)(1), unless the authorization is terminated or revoked.  Performed at [facility], [place], [place] [address]   C Difficile Quick Screen w PCR reflex     Status: None   Collection Time: 12/16/20 10:15 AM   Specimen: STOOL  Result Value Ref Range Status   C Diff antigen NEGATIVE NEGATIVE Final   C Diff toxin NEGATIVE NEGATIVE Final   C Diff interpretation No C. difficile detected.  Final    Comment: Performed at [facility], [address], [place],  [address]  Gastrointestinal Panel by PCR ,  Stool     Status: Abnormal   Collection Time: 12/16/20 10:15 AM   Specimen: Stool  Result Value Ref Range Status   Campylobacter species NOT DETECTED NOT DETECTED Final   Plesimonas shigelloides NOT DETECTED NOT DETECTED Final   Salmonella species NOT DETECTED NOT DETECTED Final   Yersinia enterocolitica NOT DETECTED NOT DETECTED Final   Vibrio species NOT DETECTED NOT DETECTED Final   Vibrio cholerae NOT DETECTED NOT DETECTED Final   Enteroaggregative E coli (EAEC) NOT DETECTED NOT DETECTED  Final   Enteropathogenic E coli (EPEC) NOT DETECTED NOT DETECTED Final   Enterotoxigenic E coli (ETEC) NOT DETECTED NOT DETECTED Final   Shiga like toxin producing E coli (STEC) NOT DETECTED NOT DETECTED Final   Shigella/Enteroinvasive E coli (EIEC) NOT DETECTED NOT DETECTED Final   Cryptosporidium NOT DETECTED NOT DETECTED Final   Cyclospora cayetanensis NOT DETECTED NOT DETECTED Final   Entamoeba histolytica NOT DETECTED NOT DETECTED Final   Giardia lamblia NOT DETECTED NOT DETECTED Final   Adenovirus F40/41 NOT DETECTED NOT DETECTED Final   Astrovirus NOT DETECTED NOT DETECTED Final   Norovirus GI/GII DETECTED (A) NOT DETECTED Final    Comment: RESULT CALLED TO, READ BACK BY AND VERIFIED WITH: JOY BEEMAN AT 1823 12/16/20.PMF    Rotavirus A NOT DETECTED NOT DETECTED Final   Sapovirus (I, II, IV, and V) NOT DETECTED NOT DETECTED Final    Comment: Performed at [facility], [address] [place], [place] [address]     Labs: BNP (last 3 results) No results for input(s): BNP in the last 8760 hours. Basic Metabolic Panel: Recent Labs  Lab 12/15/20 1019 12/16/20 0458 12/17/20 0514 12/18/20 0532 12/19/20 0520  NA 140 138 137 138 142  K 4.4 4.3 4.1 4.2 3.9  CL 100 103 103 104 105  CO2 22 27 29 31 31   GLUCOSE 91 104* 130* 133* 110*  BUN 28* 27* 21 19 17   CREATININE 1.74* 1.39* 1.02 1.05 0.84  CALCIUM 8.8* 8.3* 8.5* 8.4* 8.4*  MG  --   --  2.0 1.9 1.9   Liver Function Tests: Recent Labs  Lab 12/15/20 1019 12/17/20 0514 12/18/20 0532 12/19/20 0520  AST 21 8* 10* 27  ALT 24 13 11  33  ALKPHOS 101 65 59 59  BILITOT 1.8* 1.0 0.4 0.7  PROT 6.7 5.2* 5.0* 5.0*  ALBUMIN 3.9 3.0* 2.9* 3.0*   Recent Labs  Lab 12/15/20 1019  LIPASE 22   No results for input(s): AMMONIA in the last 168 hours. CBC: Recent Labs  Lab 12/15/20 1019 12/16/20 0458 12/17/20 0514 12/18/20 0532 12/19/20 0520  WBC 17.9* 7.0 8.4 8.3 7.9  NEUTROABS  --   --  4.1 1.9 1.5*  HGB 10.1*  6.5* 7.7* 7.8* 7.8*  HCT 32.1* 20.1* 22.8* 23.5* 23.6*  MCV 106.3* 103.6* 98.7 98.7 100.4*  PLT 211 127* 122* 111* 108*   Cardiac Enzymes: No results for input(s): CKTOTAL, CKMB, CKMBINDEX, TROPONINI in the last 168 hours. BNP: Invalid input(s): POCBNP CBG: Recent Labs  Lab 12/19/20 0810 12/19/20 1244 12/19/20 1658 12/19/20 2207 12/20/20 0740  GLUCAP 90 166* 154* 149* 104*   D-Dimer No results for input(s): DDIMER in the last 72 hours. Hgb A1c No results for input(s): HGBA1C in the last 72 hours. Lipid Profile No results for input(s): CHOL, HDL, LDLCALC, TRIG, CHOLHDL, LDLDIRECT in the last 72 hours. Thyroid function studies No results for input(s): TSH, T4TOTAL, T3FREE, THYROIDAB in the last 72 hours.  Invalid input(s):  FREET3 Anemia work up No results for input(s): VITAMINB12, FOLATE, FERRITIN, TIBC, IRON, RETICCTPCT in the last 72 hours. Urinalysis    Component Value Date/Time   COLORURINE YELLOW (A) 12/15/2020 1019   APPEARANCEUR HAZY (A) 12/15/2020 1019   LABSPEC 1.018 12/15/2020 1019   PHURINE 6.0 12/15/2020 1019   GLUCOSEU NEGATIVE 12/15/2020 1019   HGBUR NEGATIVE 12/15/2020 1019   BILIRUBINUR NEGATIVE 12/15/2020 1019   KETONESUR 5 (A) 12/15/2020 1019   PROTEINUR NEGATIVE 12/15/2020 1019   NITRITE NEGATIVE 12/15/2020 1019   LEUKOCYTESUR TRACE (A) 12/15/2020 1019   Sepsis Labs Invalid input(s): PROCALCITONIN,  WBC,  LACTICIDVEN Microbiology Recent Results (from the past 240 hour(s))  Blood culture (single)     Status: None   Collection Time: 12/15/20 10:18 AM   Specimen: BLOOD  Result Value Ref Range Status   Specimen Description BLOOD BLOOD LEFT FOREARM  Final   Special Requests   Final    BOTTLES DRAWN AEROBIC AND ANAEROBIC Blood Culture results may not be optimal due to an inadequate volume of blood received in culture bottles   Culture   Final    NO GROWTH 5 DAYS Performed at [facility], [address] [place], [place] [address]     Report Status 12/20/2020 FINAL  Final  Resp Panel by RT-PCR (Flu A&B, Covid) Nasopharyngeal Swab     Status: None   Collection Time: 12/15/20 12:04 PM   Specimen: Nasopharyngeal Swab; Nasopharyngeal(NP) swabs in vial transport medium  Result Value Ref Range Status   SARS Coronavirus 2 by RT PCR NEGATIVE NEGATIVE Final    Comment: (NOTE) SARS-CoV-2 target nucleic acids are NOT DETECTED.  The SARS-CoV-2 RNA is generally detectable in upper respiratory specimens during the acute phase of infection. The lowest concentration of SARS-CoV-2 viral copies this assay can detect is 138 copies/mL. A negative result does not preclude SARS-Cov-2 infection and should not be used as the sole basis for treatment or other patient management decisions. A negative result may occur with  improper specimen collection/handling, submission of specimen other than nasopharyngeal swab, presence of viral mutation(s) within the areas targeted by this assay, and inadequate number of viral copies(<138 copies/mL). A negative result must be combined with clinical observations, patient history, and epidemiological information. The expected result is Negative.  Fact Sheet for Patients:  EntrepreneurPulse.com.au  Fact Sheet for Healthcare Providers:  IncredibleEmployment.be  This test is no t yet approved or cleared by the [place] FDA and  has been authorized for detection and/or diagnosis of SARS-CoV-2 by FDA under an Emergency Use Authorization (EUA). This EUA will remain  in effect (meaning this test can be used) for the duration of the COVID-19 declaration under Section 564(b)(1) of the Act, 21 U.S.C.section 360bbb-3(b)(1), unless the authorization is terminated  or revoked sooner.       Influenza A by PCR NEGATIVE NEGATIVE Final   Influenza B by PCR NEGATIVE NEGATIVE Final    Comment: (NOTE) The Xpert Xpress SARS-CoV-2/FLU/RSV plus assay is intended as an aid in  the diagnosis of influenza from Nasopharyngeal swab specimens and should not be used as a sole basis for treatment. Nasal washings and aspirates are unacceptable for Xpert Xpress SARS-CoV-2/FLU/RSV testing.  Fact Sheet for Patients: EntrepreneurPulse.com.au  Fact Sheet for Healthcare Providers: IncredibleEmployment.be  This test is not yet approved or cleared by the [place] FDA and has been authorized for detection and/or diagnosis of SARS-CoV-2 by FDA under an Emergency Use Authorization (EUA). This EUA will remain in effect (  meaning this test can be used) for the duration of the COVID-19 declaration under Section 564(b)(1) of the Act, 21 U.S.C. section 360bbb-3(b)(1), unless the authorization is terminated or revoked.  Performed at [facility] [address], [place], [place] [address]   C Difficile Quick Screen w PCR reflex     Status: None   Collection Time: 12/16/20 10:15 AM   Specimen: STOOL  Result Value Ref Range Status   C Diff antigen NEGATIVE NEGATIVE Final   C Diff toxin NEGATIVE NEGATIVE Final   C Diff interpretation No C. difficile detected.  Final    Comment: Performed at [facility], [address] [place], [place] [address]  Gastrointestinal Panel by PCR , Stool     Status: Abnormal   Collection Time: 12/16/20 10:15 AM   Specimen: Stool  Result Value Ref Range Status   Campylobacter species NOT DETECTED NOT DETECTED Final   Plesimonas shigelloides NOT DETECTED NOT DETECTED Final   Salmonella species NOT DETECTED NOT DETECTED Final   Yersinia enterocolitica NOT DETECTED NOT DETECTED Final   Vibrio species NOT DETECTED NOT DETECTED Final   Vibrio cholerae NOT DETECTED NOT DETECTED Final   Enteroaggregative E coli (EAEC) NOT DETECTED NOT DETECTED Final   Enteropathogenic E coli (EPEC) NOT DETECTED NOT DETECTED Final   Enterotoxigenic E coli (ETEC) NOT DETECTED NOT DETECTED Final   Shiga like  toxin producing E coli (STEC) NOT DETECTED NOT DETECTED Final   Shigella/Enteroinvasive E coli (EIEC) NOT DETECTED NOT DETECTED Final   Cryptosporidium NOT DETECTED NOT DETECTED Final   Cyclospora cayetanensis NOT DETECTED NOT DETECTED Final   Entamoeba histolytica NOT DETECTED NOT DETECTED Final   Giardia lamblia NOT DETECTED NOT DETECTED Final   Adenovirus F40/41 NOT DETECTED NOT DETECTED Final   Astrovirus NOT DETECTED NOT DETECTED Final   Norovirus GI/GII DETECTED (A) NOT DETECTED Final    Comment: RESULT CALLED TO, READ BACK BY AND VERIFIED WITH: JOY BEEMAN AT 1823 12/16/20.PMF    Rotavirus A NOT DETECTED NOT DETECTED Final   Sapovirus (I, II, IV, and V) NOT DETECTED NOT DETECTED Final    Comment: Performed at [facility], [address] [place], [place] [address]     Time coordinating discharge: Over 30 minutes  SIGNED:   Little Ishikawa, DO [facility] 12/20/2020, 1:19 PM Pager   If 7PM-7AM, please contact night-coverage [email or web address]

## 2020-12-20 NOTE — Care Management Important Message (Signed)
Important Message  Patient Details  Name: MAJOUR FREI MRN: [id number] Date of Birth: Aug 04, 1950   Medicare Important Message Given:  Yes  Late entry:  Esgar Barnick returned my call while I was in a meeting and he's in agreement with today's discharge as I had left the reason for my call.  Doris Cheadle his father well and thanked for his time.    Juliann Pulse A Ellason Segar 12/20/2020, 1:06 PM

## 2020-12-25 ENCOUNTER — Telehealth (INDEPENDENT_AMBULATORY_CARE_PROVIDER_SITE_OTHER): Payer: Self-pay | Admitting: Vascular Surgery

## 2020-12-25 ENCOUNTER — Other Ambulatory Visit (HOSPITAL_COMMUNITY): Payer: Self-pay

## 2020-12-25 MED FILL — Medication: INTRATHECAL | Qty: 1 | Status: AC

## 2020-12-25 NOTE — Telephone Encounter (Signed)
Patient came into office requesting a medication refill of Clopidogrel.  Patient states that he tried to get a refill but was told he needed permission from his provider to get a refill.  Patient was last seen on 08/22/2020 with Dr. Lucky Cowboy and has a follow up on 02/16/2021.  Please advise.

## 2020-12-25 NOTE — Progress Notes (Signed)
PROVIDER NOTE: Information contained herein reflects review and annotations entered in association with encounter. Interpretation of such information and data should be left to medically-trained personnel. Information provided to patient can be located elsewhere in the medical record under "Patient Instructions". Document created using STT-dictation technology, any transcriptional errors that may result from process are unintentional.    Patient: James [name]  Service Category: Procedure  Provider: Gaspar Cola, MD  DOB: 1950/07/07  DOS: 12/26/2020  Location: [facility]  MRN: [id number]  Setting: Ambulatory - outpatient  Referring Provider: [name], MD  Type: Established Patient  Specialty: Interventional Pain Management  PCP: [name], MD   Primary Reason for Visit: Interventional Pain Management Treatment. CC: Abdominal Pain   Procedure:          Type: Management of infusion pump - Reservoir Refill [phone]). No rate change   Indications: 1. Cancer-related pain   2. Chronic pain syndrome   3. Chronic epigastric pain (1ry area of Pain)   4. Chronic abdominal pain   5. CLL (chronic lymphocytic leukemia) ([place])   6. Malignant lymphoma, small lymphocytic ([facility])   7. Abdominal wall pain in epigastric region   8. Presence of implanted infusion pump (Medtronic)   9. Presence of intrathecal pump (Medtronic)   10. Encounter for adjustment and management of infusion pump   11. Pharmacologic therapy   12. Chronic use of opiate for therapeutic purpose   13. Encounter for medication management   14. Encounter for therapeutic procedure   15. Non-traumatic compression fracture of T1 thoracic vertebra, sequela   16. Non-traumatic compression fracture of T2 thoracic vertebra, sequela    Pain Assessment: Self-Reported Pain Score: 5 /10             Reported level is compatible with observation.         RTCB: 05/07/2021  Pharmacotherapy Assessment    Analgesic: Oxycodone IR $RemoveBefo'10mg'PwPVQLeSQSL$ , 1 tab PO q 4 hrs (60 mg/day of oxycodone IR) (90 MME) Highest recorded MME/day: 150 mg/day 90 day average MME/day: 632.27 mg/day, according to PMP   Monitoring: [place] PMP: PDMP reviewed during this encounter.       Pharmacotherapy: No side-effects or adverse reactions reported. Compliance: No problems identified. Effectiveness: Clinically acceptable. Plan: Refer to "POC". UDS: No results found for: SUMMARY      IDDS (Intrathecal Drug Delivery System):   Device:  Manufacturer: Medtronic Synchromed II Model: [id number] Serial No.: [id number] H Type: Programmable  Volume: 40 mL reservoir Catheter Placement: Intrathecal Catheter Level: T7-8 MRI compatibility: Yes   Implant Details:  Implant Date: 02/07/2020 Implanter: Deetta Perla, MD [facility] Neurosurgery) Intrathecal Access: L3-4 Delivery Route: Intrathecal Site: Abdominal Laterality: Left  Content:  1ry Medication Class: Opioid 1ry Medication (Concentration): PF-Fentanyl (2000 mcg/mL)  2ry Medication (Concentration): PF-Bupivacaine (20 mg/mL)  3ry Medication (Concentration): None  PTM (PCA) mode:  PTM dose: 25 mcg bolus Duration of bolus: 1 minute Lockout interval: 4 hours Maximum 24-hour doses: 6  Programming:  Type: Simple continuous with PCA.  Medication, Concentration, Infusion Program, & Delivery Rate: For up-to-date details please see most recent scanned programming printout.     Changes:  Medication Change: None at this point Rate Change: No change in rate  Reported side-effects or adverse reactions: None reported  Effectiveness: Described as relatively effective, allowing for increase in activities of daily living (ADL) Clinically meaningful improvement in function (CMIF): Sustained CMIF goals met  Plan: Pump refill today   Pre-op H&P Assessment:  Mr. Kiel is a  70 y.o. (year old), male patient, seen today for interventional treatment. He  has a past surgical history that includes  Cholecystectomy (1983); Esophagogastroduodenoscopy (egd) with propofol (N/A, 11/20/2016); Cataract extraction w/ intraocular lens implant (Bilateral); Lower Extremity Angiography (Right, 05/19/2017); Esophagogastroduodenoscopy (egd) with propofol (N/A, 10/27/2017); Colonoscopy with propofol (N/A, 10/27/2017); Lower Extremity Angiography (Left, 08/10/2018); Intrathecal pump implant (Left, 02/07/2020); and Esophagogastroduodenoscopy (egd) with propofol (N/A, 11/23/2020). Mr. Marques has a current medication list which includes the following prescription(s): AMBULATORY NON FORMULARY MEDICATION, clopidogrel, ensure, folic acid, gabapentin, hydrocortisone, hydrocortisone, metformin, multivitamin with minerals, naloxone, [START ON 01/07/2021] oxycodone hcl, pantoprazole, naloxegol oxalate, [START ON 02/06/2021] oxycodone hcl, [START ON 03/08/2021] oxycodone hcl, and [START ON 04/07/2021] oxycodone hcl. His primarily concern today is the Abdominal Pain  Initial Vital Signs:  Pulse/HCG Rate: 98  Temp: 97.6 F (36.4 C) Resp: 16 BP: (!) 119/55 SpO2: 100 %  BMI: Estimated body mass index is 21.29 kg/m as calculated from the following:   Height as of this encounter: 5' 8"  (1.727 m).   Weight as of this encounter: 140 lb (63.5 kg).  Risk Assessment: Allergies: Reviewed. He has No Known Allergies.  Allergy Precautions: None required Coagulopathies: Reviewed. None identified.  Blood-thinner therapy: None at this time Active Infection(s): Reviewed. None identified. Mr. Labell is afebrile  Site Confirmation: Mr. Wafer was asked to confirm the procedure and laterality before marking the site Procedure checklist: Completed Consent: Before the procedure and under the influence of no sedative(s), amnesic(s), or anxiolytics, the patient was informed of the treatment options, risks and possible complications. To fulfill our ethical and legal obligations, as recommended by the American Medical Association's Code of Ethics, I  have informed the patient of my clinical impression; the nature and purpose of the treatment or procedure; the risks, benefits, and possible complications of the intervention; the alternatives, including doing nothing; the risk(s) and benefit(s) of the alternative treatment(s) or procedure(s); and the risk(s) and benefit(s) of doing nothing.  Mr. Barbier was provided with information about the general risks and possible complications associated with most interventional procedures. These include, but are not limited to: failure to achieve desired goals, infection, bleeding, organ or nerve damage, allergic reactions, paralysis, and/or death.  In addition, he was informed of those risks and possible complications associated to this particular procedure, which include, but are not limited to: damage to the implant; failure to decrease pain; local, systemic, or serious CNS infections, intraspinal abscess with possible cord compression and paralysis, or life-threatening such as meningitis; bleeding; organ damage; nerve injury or damage with subsequent sensory, motor, and/or autonomic system dysfunction, resulting in transient or permanent pain, numbness, and/or weakness of one or several areas of the body; allergic reactions, either minor or major life-threatening, such as anaphylactic or anaphylactoid reactions.  Furthermore, Mr. Onstott was informed of those risks and complications associated with the medications. These include, but are not limited to: allergic reactions (i.e.: anaphylactic or anaphylactoid reactions); endorphine suppression; bradycardia and/or hypotension; water retention and/or peripheral vascular relaxation leading to lower extremity edema and possible stasis ulcers; respiratory depression and/or shortness of breath; decreased metabolic rate leading to weight gain; swelling or edema; medication-induced neural toxicity; particulate matter embolism and blood vessel occlusion with resultant organ,  and/or nervous system infarction; and/or intrathecal granuloma formation with possible spinal cord compression and permanent paralysis.  Before refilling the pump Mr. Kathan was informed that some of the medications used in the devise may not be FDA approved for such use and therefore it constitutes an  off-label use of the medications.  Finally, he was informed that Medicine is not an exact science; therefore, there is also the possibility of unforeseen or unpredictable risks and/or possible complications that may result in a catastrophic outcome. The patient indicated having understood very clearly. We have given the patient no guarantees and we have made no promises. Enough time was given to the patient to ask questions, all of which were answered to the patient's satisfaction. Mr. Weatherall has indicated that he wanted to continue with the procedure. Attestation: I, the ordering provider, attest that I have discussed with the patient the benefits, risks, side-effects, alternatives, likelihood of achieving goals, and potential problems during recovery for the procedure that I have provided informed consent. Date  Time: 12/26/2020 12:45 PM  Pre-Procedure Preparation:  Monitoring: As per clinic protocol. Respiration, ETCO2, SpO2, BP, heart rate and rhythm monitor placed and checked for adequate function Safety Precautions: Patient was assessed for positional comfort and pressure points before starting the procedure. Time-out: I initiated and conducted the "Time-out" before starting the procedure, as per protocol. The patient was asked to participate by confirming the accuracy of the "Time Out" information. Verification of the correct person, site, and procedure were performed and confirmed by me, the nursing staff, and the patient. "Time-out" conducted as per Joint Commission's Universal Protocol (UP.01.01.01). Time: 1306  Description of Procedure:          Position: Supine Target Area: Central-port of  intrathecal pump. Approach: Anterior, 90 degree angle approach. Area Prepped: Entire Area around the pump implant. DuraPrep (Iodine Povacrylex [0.7% available iodine] and Isopropyl Alcohol, 74% w/w) Safety Precautions: Aspiration looking for blood return was conducted prior to all injections. At no point did we inject any substances, as a needle was being advanced. No attempts were made at seeking any paresthesias. Safe injection practices and needle disposal techniques used. Medications properly checked for expiration dates. SDV (single dose vial) medications used. Description of the Procedure: Protocol guidelines were followed. Two nurses trained to do implant refills were present during the entire procedure. The refill medication was checked by both healthcare providers as well as the patient. The patient was included in the "Time-out" to verify the medication. The patient was placed in position. The pump was identified. The area was prepped in the usual manner. The sterile template was positioned over the pump, making sure the side-port location matched that of the pump. Both, the pump and the template were held for stability. The needle provided in the Medtronic Kit was then introduced thru the center of the template and into the central port. The pump content was aspirated and discarded volume documented. The new medication was slowly infused into the pump, thru the filter, making sure to avoid overpressure of the device. The needle was then removed and the area cleansed, making sure to leave some of the prepping solution back to take advantage of its long term bactericidal properties. The pump was interrogated and programmed to reflect the correct medication, volume, and dosage. The program was printed and taken to the physician for approval. Once checked and signed by the physician, a copy was provided to the patient and another scanned into the EMR. Vitals:   12/26/20 1244 12/26/20 1245  BP:  (!)  119/55  Pulse:  98  Resp: 16 16  Temp:  97.6 F (36.4 C)  SpO2:  100%  Weight: 140 lb (63.5 kg) 140 lb (63.5 kg)  Height: 5' 8"  (1.727 m) 5' 8"  (1.727 m)  Start Time: 1306 hrs. End Time: 1319 hrs. Materials & Medications: Medtronic Refill Kit Medication(s): Please see chart orders for details.  Imaging Guidance:          Type of Imaging Technique: None used Indication(s): N/A Exposure Time: No patient exposure Contrast: None used. Fluoroscopic Guidance: N/A Ultrasound Guidance: N/A Interpretation: N/A  Antibiotic Prophylaxis:   Anti-infectives (From admission, onward)    None      Indication(s): None identified  Post-operative Assessment:  Post-procedure Vital Signs:  Pulse/HCG Rate: 98  Temp: 97.6 F (36.4 C) Resp: 16 BP: (!) 119/55 SpO2: 100 %  EBL: None  Complications: No immediate post-treatment complications observed by team, or reported by patient.  Note: The patient tolerated the entire procedure well. A repeat set of vitals were taken after the procedure and the patient was kept under observation following institutional policy, for this type of procedure. Post-procedural neurological assessment was performed, showing return to baseline, prior to discharge. The patient was provided with post-procedure discharge instructions, including a section on how to identify potential problems. Should any problems arise concerning this procedure, the patient was given instructions to immediately contact us, at any time, without hesitation. In any case, we plan to contact the patient by telephone for a follow-up status report regarding this interventional procedure.  Comments:  No additional relevant information.  Plan of Care  Orders:  Orders Placed This Encounter  Procedures   PUMP REFILL    Maintain Protocol by having two(2) healthcare providers during procedure and programming.    Scheduling Instructions:     Please refill intrathecal pump today.    Order  Specific Question:   Where will this procedure be performed?    Answer:   [facility]   PUMP REFILL    Whenever possible schedule on a procedure today.    Standing Status:   Future    Standing Expiration Date:   05/25/2021    Scheduling Instructions:     Please schedule intrathecal pump refill based on pump programming. Avoid schedule intervals of more than 120 days (4 months).    Order Specific Question:   Where will this procedure be performed?    Answer:   [facility]   CT THORACIC SPINE WO CONTRAST    Please assist in the exact localization of the intrathecal catheter tip.  Please describe the exact level.  Catheter dislodgment/migration suspected.    Standing Status:   Future    Standing Expiration Date:   01/26/2021    Scheduling Instructions:     Imaging must be done as soon as possible. Inform patient that order will expire within 30 days and I will not renew it.    Order Specific Question:   Preferred imaging location?    Answer:   [facility]    Order Specific Question:   Call Results- Best Contact Number?    Answer:   [phone] ([facility])    Order Specific Question:   Radiology Contrast Protocol - do NOT remove file path    Answer:   \\[email or web address]\CTProtocols.pdf   CT LUMBAR SPINE WO CONTRAST    Please assist in the exact localization of the intrathecal catheter tip.  Please describe the exact level.  Catheter dislodgment/migration suspected.    Standing Status:   Future    Standing Expiration Date:   01/26/2021    Scheduling Instructions:     Imaging must be done as soon as possible. Inform patient that order will expire within 30  days and I will not renew it.    Order Specific Question:   Preferred imaging location?    Answer:   [facility]    Order Specific Question:   Call Results- Best Contact Number?    Answer:   [phone] ([facility])    Order Specific Question:   Radiology Contrast Protocol - do  NOT remove file path    Answer:   \\[email or web address]\CTProtocols.pdf   ToxASSURE Select 13 (MW), Urine    Volume: 30 ml(s). Minimum 3 ml of urine is needed. Document temperature of fresh sample. Indications: Long term (current) use of opiate analgesic [phone])    Order Specific Question:   Release to patient    Answer:   Immediate   Informed Consent Details: Physician/Practitioner Attestation; Transcribe to consent form and obtain patient signature    Transcribe to consent form and obtain patient signature.    Order Specific Question:   Physician/Practitioner attestation of informed consent for procedure/surgical case    Answer:   I, the physician/practitioner, attest that I have discussed with the patient the benefits, risks, side effects, alternatives, likelihood of achieving goals and potential problems during recovery for the procedure that I have provided informed consent.    Order Specific Question:   Procedure    Answer:   Intrathecal pump refill    Order Specific Question:   Physician/Practitioner performing the procedure    Answer:   Attending Physician: Kathlen Brunswick. Dossie Arbour, MD & designated trained staff    Order Specific Question:   Indication/Reason    Answer:   Chronic Pain Syndrome (G89.4), presence of an intrathecal pump (Z97.8)    Chronic Opioid Analgesic:  Oxycodone IR 58m, 1 tab PO q 4 hrs (60 mg/day of oxycodone IR) (90 MME) Highest recorded MME/day: 150 mg/day 90 day average MME/day: 632.27 mg/day, according to PMP   Medications ordered for procedure: Meds ordered this encounter  Medications   naloxone (NARCAN) nasal spray 4 mg/0.1 mL    Sig: Place 1 spray into the nose as needed for up to 365 doses (for opioid-induced respiratory depresssion). In case of emergency (overdose), spray once into each nostril. If no response within 3 minutes, repeat application and call [phone]    Dispense:  1 each    Refill:  0    Instruct patient in proper use of device.    naloxegol oxalate (MOVANTIK) 25 MG TABS tablet    Sig: Take 1 tablet (25 mg total) by mouth daily.    Dispense:  30 tablet    Refill:  11    Fill one day early if pharmacy is closed on scheduled refill date. Generic permitted. Do not send renewal requests. Void any older duplicate prescription or refill(s) that may be on file.   Oxycodone HCl 10 MG TABS    Sig: Take 1 tablet (10 mg total) by mouth every 4 (four) hours as needed. Must last 30 days    Dispense:  180 tablet    Refill:  0    Not a duplicate. Do NOT delete! Dispense 1 day early if closed on fill date. Warn not to take CNS-depressants 8 hours before or after taking opioid. Do not send refill request. Renewal requires appointment.   Oxycodone HCl 10 MG TABS    Sig: Take 1 tablet (10 mg total) by mouth every 4 (four) hours as needed. Must last 30 days    Dispense:  180 tablet    Refill:  0    Not a  duplicate. Do NOT delete! Dispense 1 day early if closed on fill date. Warn not to take CNS-depressants 8 hours before or after taking opioid. Do not send refill request. Renewal requires appointment.   Oxycodone HCl 10 MG TABS    Sig: Take 1 tablet (10 mg total) by mouth every 4 (four) hours as needed. Must last 30 days    Dispense:  180 tablet    Refill:  0    Not a duplicate. Do NOT delete! Dispense 1 day early if closed on fill date. Warn not to take CNS-depressants 8 hours before or after taking opioid. Do not send refill request. Renewal requires appointment.    Medications administered: Amish F. Olaes had no medications administered during this visit.  See the medical record for exact dosing, route, and time of administration.  Follow-up plan:   Return for Pump Refill (Max:56mo.       Interventional Therapies  Risk  Complexity Considerations:   NOTE: PLAVIX ANTICOAGULATION  (Stop: 7 days  Restart: 2 hrs)   Planned  Pending:   Decrease pump PCA activations from 6/day to 3/day. Therapeutic bilateral splanchnic nerves  neurolysis #1 with alcohol  Diagnostic intrathecal catheter test under fluoroscopic guidance to determine location    Under consideration:   Possible revision of intrathecal catheter    Completed:   Diagnostic bilateral celiac plexus block x2 (06/01/2019; 11/02/2019) (50/30/30/0) (75/75/0/0)  Therapeutic bilateral celiac plexus neurolysis with phenol x1 (04/06/2020)  Diagnostic bilateral rectus abdominis trigger point injection x1 (04/06/2020) (0/0/0)  Diagnostic (ML) T12-L1 intrathecal injection of PF-fentanyl 25 mcg as an intrathecal pump trial x1 (12/14/2019)   Therapeutic  Palliative (PRN) options:   Intrathecal pump management      Recent Visits Date Type Provider Dept  11/28/20 Procedure visit NMilinda Pointer MD [facility]  10/24/20 Procedure visit NMilinda Pointer MD [facility]  09/28/20 Procedure visit NMilinda Pointer MD [facility]  Showing recent visits within past 90 days and meeting all other requirements Today's Visits Date Type Provider Dept  12/26/20 Procedure visit NMilinda Pointer MD [facility]  Showing today's visits and meeting all other requirements Future Appointments No visits were found meeting these conditions. Showing future appointments within next 90 days and meeting all other requirements Disposition: Discharge home  Discharge (Date  Time): 12/26/2020; 1324 hrs.   Primary Care Physician: HTracie Harrier MD [facility] Note by: FGaspar Cola MD Date: 12/26/2020; Time: 1:45 PM  Disclaimer:  Medicine is not an eChief Strategy Officer The only guarantee in medicine is that nothing is guaranteed. It is important to note that the decision to proceed with this intervention was based on the information collected from the patient. The Data and conclusions were drawn from the patient's questionnaire, the interview, and the physical examination. Because the information was  provided in large part by the patient, it cannot be guaranteed that it has not been purposely or unconsciously manipulated. Every effort has been made to obtain as much relevant data as possible for this evaluation. It is important to note that the conclusions that lead to this procedure are derived in large part from the available data. Always take into account that the treatment will also be dependent on availability of resources and existing treatment guidelines, considered by other Pain Management Practitioners as being common knowledge and practice, at the time of the intervention. For Medico-Legal purposes, it is also important to point out that variation in procedural techniques and pharmacological  choices are the acceptable norm. The indications, contraindications, technique, and results of the above procedure should only be interpreted and judged by a Board-Certified Interventional Pain Specialist with extensive familiarity and expertise in the same exact procedure and technique.

## 2020-12-25 NOTE — Telephone Encounter (Signed)
I called the pt an made him aware that his Rx has been called into [facility] .

## 2020-12-26 ENCOUNTER — Ambulatory Visit: Payer: Medicare Other | Attending: Pain Medicine | Admitting: Pain Medicine

## 2020-12-26 ENCOUNTER — Encounter: Payer: Self-pay | Admitting: Pain Medicine

## 2020-12-26 ENCOUNTER — Other Ambulatory Visit: Payer: Self-pay

## 2020-12-26 ENCOUNTER — Telehealth: Payer: Self-pay | Admitting: *Deleted

## 2020-12-26 VITALS — BP 119/55 | HR 98 | Temp 97.6°F | Resp 16 | Ht 68.0 in | Wt 140.0 lb

## 2020-12-26 DIAGNOSIS — Z451 Encounter for adjustment and management of infusion pump: Secondary | ICD-10-CM | POA: Diagnosis not present

## 2020-12-26 DIAGNOSIS — G893 Neoplasm related pain (acute) (chronic): Secondary | ICD-10-CM | POA: Diagnosis not present

## 2020-12-26 DIAGNOSIS — Z5189 Encounter for other specified aftercare: Secondary | ICD-10-CM

## 2020-12-26 DIAGNOSIS — Z978 Presence of other specified devices: Secondary | ICD-10-CM | POA: Insufficient documentation

## 2020-12-26 DIAGNOSIS — C8300 Small cell B-cell lymphoma, unspecified site: Secondary | ICD-10-CM

## 2020-12-26 DIAGNOSIS — Z79899 Other long term (current) drug therapy: Secondary | ICD-10-CM | POA: Insufficient documentation

## 2020-12-26 DIAGNOSIS — C9110 Chronic lymphocytic leukemia of B-cell type not having achieved remission: Secondary | ICD-10-CM | POA: Insufficient documentation

## 2020-12-26 DIAGNOSIS — K5903 Drug induced constipation: Secondary | ICD-10-CM

## 2020-12-26 DIAGNOSIS — Z95828 Presence of other vascular implants and grafts: Secondary | ICD-10-CM

## 2020-12-26 DIAGNOSIS — M4854XS Collapsed vertebra, not elsewhere classified, thoracic region, sequela of fracture: Secondary | ICD-10-CM | POA: Diagnosis not present

## 2020-12-26 DIAGNOSIS — Z79891 Long term (current) use of opiate analgesic: Secondary | ICD-10-CM | POA: Diagnosis not present

## 2020-12-26 DIAGNOSIS — M5134 Other intervertebral disc degeneration, thoracic region: Secondary | ICD-10-CM | POA: Insufficient documentation

## 2020-12-26 DIAGNOSIS — G894 Chronic pain syndrome: Secondary | ICD-10-CM | POA: Insufficient documentation

## 2020-12-26 DIAGNOSIS — G8929 Other chronic pain: Secondary | ICD-10-CM

## 2020-12-26 DIAGNOSIS — C8590 Non-Hodgkin lymphoma, unspecified, unspecified site: Secondary | ICD-10-CM | POA: Insufficient documentation

## 2020-12-26 DIAGNOSIS — R1013 Epigastric pain: Secondary | ICD-10-CM | POA: Insufficient documentation

## 2020-12-26 DIAGNOSIS — T402X5A Adverse effect of other opioids, initial encounter: Secondary | ICD-10-CM

## 2020-12-26 MED ORDER — OXYCODONE HCL 10 MG PO TABS: 10.0000 mg | ORAL_TABLET | ORAL | 0 refills | Status: DC | PRN

## 2020-12-26 MED ORDER — NALOXONE HCL 4 MG/0.1ML NA LIQD
1.0000 | NASAL | 0 refills | Status: AC | PRN
Start: 2020-12-26 — End: 2021-12-26

## 2020-12-26 MED ORDER — NALOXEGOL OXALATE 25 MG PO TABS: 25.0000 mg | ORAL_TABLET | Freq: Every day | ORAL | 11 refills | Status: AC

## 2020-12-26 NOTE — Telephone Encounter (Signed)
Per Dr. Rogue Bussing - patient needs to be evaluated in [facility] in the next few days with lab/[facility]/ possible 1 unit of blood.  Colette- pls arrange and contact sonMerrilee Seashore with apts.

## 2020-12-26 NOTE — Progress Notes (Signed)
Safety precautions to be maintained throughout the outpatient stay will include: orient to surroundings, keep bed in low position, maintain call bell within reach at all times, provide assistance with transfer out of bed and ambulation.  

## 2020-12-26 NOTE — Patient Instructions (Addendum)
Opioid Overdose Opioids are drugs that are often used to treat pain. Opioids include illegal drugs, such as heroin, as well as prescription pain medicines, such as codeine, morphine, hydrocodone, oxycodone, and fentanyl. An opioid overdose happens when you take too much of an opioid. An overdose may be intentional or accidentaland can happen with any type of opioid. The effects of an overdose can be mild, dangerous, or even deadly. Opioidoverdose is a medical emergency. What are the causes? This condition may be caused by: Taking too much of an opioid on purpose. Taking too much of an opioid by accident. Using two or more substances that contain opioids at the same time. Taking an opioid with a substance that affects your heart, breathing, or blood pressure. These include alcohol, tranquilizers, sleeping pills, illegal drugs, and some over-the-counter medicines. This condition may also happen due to an error made by: A health care provider who prescribes a medicine. The pharmacist who fills the prescription order. What increases the risk? This condition is more likely in: Children. They may be attracted to colorful pills. Because of a child's small size, even a small amount of a drug can be dangerous. Older people. They may be taking many different drugs. Older people may have difficulty reading labels or remembering when they last took their medicine. They may also be more sensitive to the effects of opioids. People with chronic medical conditions, especially heart, liver, kidney, or neurological diseases. People who take an opioid for a long period of time. People who use: Illegal drugs. IV heroin is especially dangerous. Other substances, including alcohol, while using an opioid. People who have: A history of drug or alcohol abuse. Certain mental health conditions. A history of previous drug overdoses. People who take opioids that are not prescribed for them. What are the signs or  symptoms? Symptoms of this condition depend on the type of opioid and the amount that was taken. Common symptoms include: Sleepiness or difficulty waking from sleep. Decrease in attention. Confusion. Slurred speech. Slowed breathing and a slow pulse (bradycardia). Nausea and vomiting. Abnormally small pupils. Signs and symptoms that require emergency treatment include: Cold, clammy, and pale skin. Blue lips and fingernails. Vomiting. Gurgling sounds in the throat. A pulse that is very slow or difficult to detect. Breathing that is very irregular, slow, noisy, or difficult to detect. Limp body. Inability to respond to speech or be awakened from sleep (stupor). Seizures. How is this diagnosed? This condition is diagnosed based on your symptoms and medical history. It is important to tell your health care provider: About all of the opioids that you took. When you took the opioids. Whether you were drinking alcohol or using marijuana, cocaine, or other drugs. Your health care provider will do a physical exam. This exam may include: Checking and monitoring your heart rate and rhythm, breathing rate, temperature, and blood pressure (vital signs). Measuring oxygen levels in your blood. Checking for abnormally small pupils. You may also have blood tests or urine tests. You may have X-rays if you arehaving severe breathing problems. How is this treated? This condition requires immediate medical treatment and hospitalization. Treatment is given in the hospital intensive care (ICU) setting. Supporting your vital signs and your breathing is the first step in treating an opioid overdose. Treatment may also include: Giving salts and minerals (electrolytes) along with fluids through an IV. Inserting a breathing tube (endotracheal tube) in your airway to help you breathe if you cannot breathe on your own or you are in   danger of not being able to breathe on your own. Giving oxygen through a small  tube under your nose. Passing a tube through your nose and into your stomach (nasogastric tube, or NG tube) to empty your stomach. Giving medicines that: Increase your blood pressure. Relieve nausea and vomiting. Relieve abdominal pain and cramping. Reverse the effects of the opioid (naloxone). Monitoring your heart and oxygen levels. Ongoing counseling and mental health support if you intentionally overdosed or used an illegal drug. Follow these instructions at home:  Medicines Take over-the-counter and prescription medicines only as told by your health care provider. Always ask your health care provider about possible side effects and interactions of any new medicine that you start taking. Keep a list of all the medicines that you take, including over-the-counter medicines. Bring this list with you to all your medical visits. General instructions Drink enough fluid to keep your urine pale yellow. Keep all follow-up visits as told by your health care provider. This is important. How is this prevented? Read the drug inserts that come with your opioid pain medicines. Take medicines only as told by your health care provider. Do not take more medicine than you are told. Do not take medicines more frequently than you are told. Do not drink alcohol or take sedatives when taking opioids. Do not use illegal or recreational drugs, including cocaine, ecstasy, and marijuana. Do not take opioid medicines that are not prescribed for you. Store all medicines in safety containers that are out of the reach of children. Get help if you are struggling with: Alcohol or drug use. Depression or another mental health problem. Thoughts of hurting yourself or another person. Keep the phone number of your local poison control center near your phone or in your mobile phone. In the [place] the hotline of the National Poison Control Center is (800) 222-1222. If you were prescribed naloxone, make sure you understand  how to take it. Contact a health care provider if you: Need help understanding how to take your pain medicines. Feel your medicines are too strong. Are concerned that your pain medicines are not working well for your pain. Develop new symptoms or side effects when you are taking medicines. Get help right away if: You or someone else is having symptoms of an opioid overdose. Get help even if you are not sure. You have serious thoughts about hurting yourself or others. You have: Chest pain. Difficulty breathing. A loss of consciousness. These symptoms may represent a serious problem that is an emergency. Do not wait to see if the symptoms will go away. Get medical help right away. Call your local emergency services (911 in the [place] Do not drive yourself to the hospital. If you ever feel like you may hurt yourself or others, or have thoughts about taking your own life, get help right away. You can go to your nearest emergency department or call: Your local emergency services (911 in the [place] A suicide crisis helpline, such as the National Suicide Prevention Lifeline at 1-800-273-8255. This is open 24 hours a day. Summary Opioids are drugs that are often used to treat pain. Opioids include illegal drugs, such as heroin, as well as prescription pain medicines. An opioid overdose happens when you take too much of an opioid. Overdoses can be intentional or accidental. Opioid overdose is very dangerous. It is a life-threatening emergency. If you or someone you know is experiencing an opioid overdose, get help right away. This information is not intended to replace advice   given to you by your health care provider. Make sure you discuss any questions you have with your healthcare provider. Document Revised: 04/02/2018 Document Reviewed: 04/02/2018 Elsevier Patient Education  2022 [place]. ____________________________________________________________________________________________  Medication Rules  Purpose: To inform patients, and their family members, of our rules and regulations.  Applies to: All patients receiving prescriptions (written or electronic).  Pharmacy of record: Pharmacy where electronic prescriptions will be sent. If written prescriptions are taken to a different pharmacy, please inform the nursing staff. The pharmacy listed in the electronic medical record should be the one where you would like electronic prescriptions to be sent.  Electronic prescriptions: In compliance with the [place] (STOP) Act of 2017 (Session Lanny Cramp [phone]), effective April 29, 2018, all controlled substances must be electronically prescribed. Calling prescriptions to the pharmacy will cease to exist.  Prescription refills: Only during scheduled appointments. Applies to all prescriptions.  NOTE: The following applies primarily to controlled substances (Opioid* Pain Medications).   Type of encounter (visit): For patients receiving controlled substances, face-to-face visits are required. (Not an option or up to the patient.)  Patient's responsibilities: Pain Pills: Bring all pain pills to every appointment (except for procedure appointments). Pill Bottles: Bring pills in original pharmacy bottle. Always bring the newest bottle. Bring bottle, even if empty. Medication refills: You are responsible for knowing and keeping track of what medications you take and those you need refilled. The day before your appointment: write a list of all prescriptions that need to be refilled. The day of the appointment: give the list to the admitting nurse. Prescriptions will be written only during appointments. No prescriptions will be written on procedure days. If you forget a medication: it will not be "Called in", "Faxed", or "electronically sent". You  will need to get another appointment to get these prescribed. No early refills. Do not call asking to have your prescription filled early. Prescription Accuracy: You are responsible for carefully inspecting your prescriptions before leaving our office. Have the discharge nurse carefully go over each prescription with you, before taking them home. Make sure that your name is accurately spelled, that your address is correct. Check the name and dose of your medication to make sure it is accurate. Check the number of pills, and the written instructions to make sure they are clear and accurate. Make sure that you are given enough medication to last until your next medication refill appointment. Taking Medication: Take medication as prescribed. When it comes to controlled substances, taking less pills or less frequently than prescribed is permitted and encouraged. Never take more pills than instructed. Never take medication more frequently than prescribed.  Inform other Doctors: Always inform, all of your healthcare providers, of all the medications you take. Pain Medication from other Providers: You are not allowed to accept any additional pain medication from any other Doctor or Healthcare provider. There are two exceptions to this rule. (see below) In the event that you require additional pain medication, you are responsible for notifying us, as stated below. Cough Medicine: Often these contain an opioid, such as codeine or hydrocodone. Never accept or take cough medicine containing these opioids if you are already taking an opioid* medication. The combination may cause respiratory failure and death. Medication Agreement: You are responsible for carefully reading and following our Medication Agreement. This must be signed before receiving any prescriptions from our practice. Safely store a copy of your signed Agreement. Violations to the Agreement will result in no  further prescriptions. (Additional copies of  our Medication Agreement are available upon request.) Laws, Rules, & Regulations: All patients are expected to follow all Federal and [employer], [employer], Rules, [employer] [facility]. Ignorance of the Laws does not constitute a valid excuse.  Illegal drugs and Controlled Substances: The use of illegal substances (including, but not limited to marijuana and its derivatives) and/or the illegal use of any controlled substances is strictly prohibited. Violation of this rule may result in the immediate and permanent discontinuation of any and all prescriptions being written by our practice. The use of any illegal substances is prohibited. Adopted CDC guidelines & recommendations: Target dosing levels will be at or below 60 MME/day. Use of benzodiazepines** is not recommended.  Exceptions: There are only two exceptions to the rule of not receiving pain medications from other Healthcare Providers. Exception #1 (Emergencies): In the event of an emergency (i.e.: accident requiring emergency care), you are allowed to receive additional pain medication. However, you are responsible for: As soon as you are able, call our office [phone], at any time of the day or night, and leave a message stating your name, the date and nature of the emergency, and the name and dose of the medication prescribed. In the event that your call is answered by a member of our staff, make sure to document and save the date, time, and the name of the person that took your information.  Exception #2 (Planned Surgery): In the event that you are scheduled by another doctor or dentist to have any type of surgery or procedure, you are allowed (for a period no longer than 30 days), to receive additional pain medication, for the acute post-op pain. However, in this case, you are responsible for picking up a copy of our "Post-op Pain Management for Surgeons" handout, and giving it to your surgeon or dentist. This document is available at our office,  and does not require an appointment to obtain it. Simply go to our office during business hours (Monday-Thursday from 8:00 AM to 4:00 PM) (Friday 8:00 AM to 12:00 Noon) or if you have a scheduled appointment with [place], prior to your surgery, and ask for it by name. In addition, you are responsible for: calling our office [phone], at any time of the day or night, and leaving a message stating your name, name of your surgeon, type of surgery, and date of procedure or surgery. Failure to comply with your responsibilities may result in termination of therapy involving the controlled substances.  *Opioid medications include: morphine, codeine, oxycodone, oxymorphone, hydrocodone, hydromorphone, meperidine, tramadol, tapentadol, buprenorphine, fentanyl, methadone. **Benzodiazepine medications include: diazepam (Valium), alprazolam (Xanax), clonazepam (Klonopine), lorazepam (Ativan), clorazepate (Tranxene), chlordiazepoxide (Librium), estazolam (Prosom), oxazepam (Serax), temazepam (Restoril), triazolam (Halcion) (Last updated: 03/27/2020) ____________________________________________________________________________________________  ____________________________________________________________________________________________  Medication Recommendations and Reminders  Applies to: All patients receiving prescriptions (written and/or electronic).  Medication Rules & Regulations: These rules and regulations exist for your safety and that of others. They are not flexible and neither are we. Dismissing or ignoring them will be considered "non-compliance" with medication therapy, resulting in complete and irreversible termination of such therapy. (See document titled "Medication Rules" for more details.) In all conscience, because of safety reasons, we cannot continue providing a therapy where the patient does not follow instructions.  Pharmacy of record:  Definition: This is the pharmacy where your electronic  prescriptions will be sent.  We do not endorse any particular pharmacy, however, we have experienced problems with [facility] not securing enough  medication supply for the community. We do not restrict you in your choice of pharmacy. However, once we write for your prescriptions, we will NOT be re-sending more prescriptions to fix restricted supply problems created by your pharmacy, or your insurance.  The pharmacy listed in the electronic medical record should be the one where you want electronic prescriptions to be sent. If you choose to change pharmacy, simply notify our nursing staff.  Recommendations: Keep all of your pain medications in a safe place, under lock and key, even if you live alone. We will NOT replace lost, stolen, or damaged medication. After you fill your prescription, take 1 week's worth of pills and put them away in a safe place. You should keep a separate, properly labeled bottle for this purpose. The remainder should be kept in the original bottle. Use this as your primary supply, until it runs out. Once it's gone, then you know that you have 1 week's worth of medicine, and it is time to come in for a prescription refill. If you do this correctly, it is unlikely that you will ever run out of medicine. To make sure that the above recommendation works, it is very important that you make sure your medication refill appointments are scheduled at least 1 week before you run out of medicine. To do this in an effective manner, make sure that you do not leave the office without scheduling your next medication management appointment. Always ask the nursing staff to show you in your prescription , when your medication will be running out. Then arrange for the receptionist to get you a return appointment, at least 7 days before you run out of medicine. Do not wait until you have 1 or 2 pills left, to come in. This is very poor planning and does not take into consideration that we may need to  cancel appointments due to bad weather, sickness, or emergencies affecting our staff. DO NOT ACCEPT A "Partial Fill": If for any reason your pharmacy does not have enough pills/tablets to completely fill or refill your prescription, do not allow for a "partial fill". The law allows the pharmacy to complete that prescription within 72 hours, without requiring a new prescription. If they do not fill the rest of your prescription within those 72 hours, you will need a separate prescription to fill the remaining amount, which we will NOT provide. If the reason for the partial fill is your insurance, you will need to talk to the pharmacist about payment alternatives for the remaining tablets, but again, DO NOT ACCEPT A PARTIAL FILL, unless you can trust your pharmacist to obtain the remainder of the pills within 72 hours.  Prescription refills and/or changes in medication(s):  Prescription refills, and/or changes in dose or medication, will be conducted only during scheduled medication management appointments. (Applies to both, written and electronic prescriptions.) No refills on procedure days. No medication will be changed or started on procedure days. No changes, adjustments, and/or refills will be conducted on a procedure day. Doing so will interfere with the diagnostic portion of the procedure. No phone refills. No medications will be "called into the pharmacy". No Fax refills. No weekend refills. No Holliday refills. No after hours refills.  Remember:  Business hours are:  Monday to Thursday 8:00 AM to 4:00 PM Provider's Schedule: Milinda Pointer, MD - Appointments are:  Medication management: Monday and Wednesday 8:00 AM to 4:00 PM Procedure day: Tuesday and Thursday 7:30 AM to 4:00 PM Gillis Santa, MD -  Appointments are:  Medication management: Tuesday and Thursday 8:00 AM to 4:00 PM Procedure day: Monday and Wednesday 7:30 AM to 4:00 PM (Last update:  11/17/2019) ____________________________________________________________________________________________  ____________________________________________________________________________________________  CBD (cannabidiol) WARNING  Applicable to: All individuals currently taking or considering taking CBD (cannabidiol) and, more important, all patients taking opioid analgesic controlled substances (pain medication). (Example: oxycodone; oxymorphone; hydrocodone; hydromorphone; morphine; methadone; tramadol; tapentadol; fentanyl; buprenorphine; butorphanol; dextromethorphan; meperidine; codeine; etc.)  Legal status: CBD remains a Schedule I drug prohibited for any use. CBD is illegal with one exception. In the [place], CBD has a limited Transport planner (FDA) approval for the treatment of two specific types of epilepsy disorders. Only one CBD product has been approved by the FDA for this purpose: "Epidiolex". FDA is aware that some companies are marketing products containing cannabis and cannabis-derived compounds in ways that violate the [employer], Drug and Cosmetic Act [facility] Act) and that may put the health and safety of consumers at risk. The FDA, a Federal agency, has not enforced the CBD status since 2018.   Legality: Some manufacturers ship CBD products nationally, which is illegal. Often such products are sold online and are therefore available throughout the country. CBD is openly sold in head shops and health food stores in some states where such sales have not been explicitly legalized. Selling unapproved products with unsubstantiated therapeutic claims is not only a violation of the law, but also can put patients at risk, as these products have not been proven to be safe or effective. Federal illegality makes it difficult to conduct research on CBD.  Reference: "FDA Regulation of Cannabis and Cannabis-Derived Products, Including Cannabidiol (CBD)" -  [employer]  Warning: CBD is not FDA approved and has not undergo the same manufacturing controls as prescription drugs.  This means that the purity and safety of available CBD may be questionable. Most of the time, despite manufacturer's claims, it is contaminated with THC (delta-9-tetrahydrocannabinol - the chemical in marijuana responsible for the "HIGH").  When this is the case, the [facility] contaminant will trigger a positive urine drug screen (UDS) test for Marijuana (carboxy-THC). Because a positive UDS for any illicit substance is a violation of our medication agreement, your opioid analgesics (pain medicine) may be permanently discontinued.  MORE ABOUT CBD  General Information: CBD  is a derivative of the Marijuana (cannabis sativa) plant discovered in 55. It is one of the 113 identified substances found in Marijuana. It accounts for up to 40% of the plant's extract. As of 2018, preliminary clinical studies on CBD included research for the treatment of anxiety, movement disorders, and pain. CBD is available and consumed in multiple forms, including inhalation of smoke or vapor, as an aerosol spray, and by mouth. It may be supplied as an oil containing CBD, capsules, dried cannabis, or as a liquid solution. CBD is thought not to be as psychoactive as THC (delta-9-tetrahydrocannabinol - the chemical in marijuana responsible for the "HIGH"). Studies suggest that CBD may interact with different biological target receptors in the body, including cannabinoid and other neurotransmitter receptors. As of 2018 the mechanism of action for its biological effects has not been determined.  Side-effects  Adverse reactions: Dry mouth, diarrhea, decreased appetite, fatigue, drowsiness, malaise, weakness, sleep disturbances, and others.  Drug interactions: CBC may interact with other medications  such as blood-thinners. (Last update: 12/04/2019) ____________________________________________________________________________________________  ____________________________________________________________________________________________  Drug Holidays (Slow)  What is a "Drug Holiday"? Drug Holiday: is the name given to the period of time  during which a patient stops taking a medication(s) for the purpose of eliminating tolerance to the drug.  Benefits Improved effectiveness of opioids. Decreased opioid dose needed to achieve benefits. Improved pain with lesser dose.  What is tolerance? Tolerance: is the progressive decreased in effectiveness of a drug due to its repetitive use. With repetitive use, the body gets use to the medication and as a consequence, it loses its effectiveness. This is a common problem seen with opioid pain medications. As a result, a larger dose of the drug is needed to achieve the same effect that used to be obtained with a smaller dose.  How long should a "Drug Holiday" last? You should stay off of the pain medicine for at least 14 consecutive days. (2 weeks)  Should I stop the medicine "cold [place]"? No. You should always coordinate with your Pain Specialist so that he/she can provide you with the correct medication dose to make the transition as smoothly as possible.  How do I stop the medicine? Slowly. You will be instructed to decrease the daily amount of pills that you take by one (1) pill every seven (7) days. This is called a "slow downward taper" of your dose. For example: if you normally take four (4) pills per day, you will be asked to drop this dose to three (3) pills per day for seven (7) days, then to two (2) pills per day for seven (7) days, then to one (1) per day for seven (7) days, and at the end of those last seven (7) days, this is when the "Drug Holiday" would start.   Will I have withdrawals? By doing a "slow downward taper" like this one, it  is unlikely that you will experience any significant withdrawal symptoms. Typically, what triggers withdrawals is the sudden stop of a high dose opioid therapy. Withdrawals can usually be avoided by slowly decreasing the dose over a prolonged period of time. If you do not follow these instructions and decide to stop your medication abruptly, withdrawals may be possible.  What are withdrawals? Withdrawals: refers to the wide range of symptoms that occur after stopping or dramatically reducing opiate drugs after heavy and prolonged use. Withdrawal symptoms do not occur to patients that use low dose opioids, or those who take the medication sporadically. Contrary to benzodiazepine (example: Valium, Xanax, etc.) or alcohol withdrawals ("Delirium Tremens"), opioid withdrawals are not lethal. Withdrawals are the physical manifestation of the body getting rid of the excess receptors.  Expected Symptoms Early symptoms of withdrawal may include: Agitation Anxiety Muscle aches Increased tearing Insomnia Runny nose Sweating Yawning  Late symptoms of withdrawal may include: Abdominal cramping Diarrhea Dilated pupils Goose bumps Nausea Vomiting  Will I experience withdrawals? Due to the slow nature of the taper, it is very unlikely that you will experience any.  What is a slow taper? Taper: refers to the gradual decrease in dose.  (Last update: 11/17/2019) ____________________________________________________________________________________________

## 2020-12-26 NOTE — Telephone Encounter (Signed)
Patient son called reporting that he is going to move [place] him this Saturday and would like for patient to see Dr B before he moves. Please advise

## 2020-12-27 ENCOUNTER — Telehealth: Payer: Self-pay

## 2020-12-27 NOTE — Telephone Encounter (Signed)
Post IT pump refill phone call.  Mailbox is full.  Patient left PTC controller here at clinic and I wanted to let him know but was unable to leave a message.

## 2020-12-28 ENCOUNTER — Inpatient Hospital Stay: Payer: Medicare Other

## 2020-12-28 ENCOUNTER — Inpatient Hospital Stay (HOSPITAL_BASED_OUTPATIENT_CLINIC_OR_DEPARTMENT_OTHER): Payer: Medicare Other | Admitting: Hospice and Palliative Medicine

## 2020-12-28 ENCOUNTER — Other Ambulatory Visit: Payer: Self-pay | Admitting: *Deleted

## 2020-12-28 ENCOUNTER — Other Ambulatory Visit: Payer: Self-pay

## 2020-12-28 ENCOUNTER — Inpatient Hospital Stay: Payer: Medicare Other | Attending: Internal Medicine

## 2020-12-28 VITALS — BP 163/86 | HR 96 | Temp 97.6°F | Resp 18 | Wt 138.0 lb

## 2020-12-28 DIAGNOSIS — N343 Urethral syndrome, unspecified: Secondary | ICD-10-CM

## 2020-12-28 DIAGNOSIS — C9110 Chronic lymphocytic leukemia of B-cell type not having achieved remission: Secondary | ICD-10-CM | POA: Diagnosis not present

## 2020-12-28 DIAGNOSIS — Z79899 Other long term (current) drug therapy: Secondary | ICD-10-CM | POA: Insufficient documentation

## 2020-12-28 DIAGNOSIS — D649 Anemia, unspecified: Secondary | ICD-10-CM | POA: Diagnosis present

## 2020-12-28 DIAGNOSIS — R5382 Chronic fatigue, unspecified: Secondary | ICD-10-CM

## 2020-12-28 LAB — BASIC METABOLIC PANEL
Anion gap: 9 (ref 5–15)
BUN: 20 mg/dL (ref 8–23)
CO2: 25 mmol/L (ref 22–32)
Calcium: 8.6 mg/dL — ABNORMAL LOW (ref 8.9–10.3)
Chloride: 110 mmol/L (ref 98–111)
Creatinine, Ser: 1.1 mg/dL (ref 0.61–1.24)
GFR, Estimated: >60 mL/min (ref >60)
Glucose, Bld: 190 mg/dL — ABNORMAL HIGH (ref 70–99)
Potassium: 4.9 mmol/L (ref 3.5–5.1)
Sodium: 144 mmol/L (ref 135–145)

## 2020-12-28 LAB — CBC WITH DIFFERENTIAL/PLATELET
Abs Immature Granulocytes: 0.03 10*3/uL (ref 0.00–0.07)
Basophils Absolute: 0 10*3/uL (ref 0.0–0.1)
Basophils Relative: 0 %
Eosinophils Absolute: 0 10*3/uL (ref 0.0–0.5)
Eosinophils Relative: 0 %
HCT: 26.2 % — ABNORMAL LOW (ref 39.0–52.0)
Hemoglobin: 7.9 g/dL — ABNORMAL LOW (ref 13.0–17.0)
Immature Granulocytes: 0 %
Lymphocytes Relative: 59 %
Lymphs Abs: 6.2 10*3/uL — ABNORMAL HIGH (ref 0.7–4.0)
MCH: 32 pg (ref 26.0–34.0)
MCHC: 30.2 g/dL (ref 30.0–36.0)
MCV: 106.1 fL — ABNORMAL HIGH (ref 80.0–100.0)
Monocytes Absolute: 1.3 10*3/uL — ABNORMAL HIGH (ref 0.1–1.0)
Monocytes Relative: 12 %
Neutro Abs: 3 10*3/uL (ref 1.7–7.7)
Neutrophils Relative %: 29 %
Platelets: 158 10*3/uL (ref 150–400)
RBC: 2.47 MIL/uL — ABNORMAL LOW (ref 4.22–5.81)
RDW: 20.6 % — ABNORMAL HIGH (ref 11.5–15.5)
Smear Review: NORMAL
WBC: 10.6 10*3/uL — ABNORMAL HIGH (ref 4.0–10.5)
nRBC: 0 % (ref 0.0–0.2)

## 2020-12-28 LAB — SAMPLE TO BLOOD BANK

## 2020-12-28 LAB — URINALYSIS, COMPLETE (UACMP) WITH MICROSCOPIC
Bilirubin Urine: NEGATIVE
Glucose, UA: NEGATIVE mg/dL
Ketones, ur: NEGATIVE mg/dL
Nitrite: POSITIVE — AB
Protein, ur: 100 mg/dL — AB
Specific Gravity, Urine: 1.016 (ref 1.005–1.030)
WBC, UA: >50 WBC/hpf — ABNORMAL HIGH (ref 0–5)
pH: 5 (ref 5.0–8.0)

## 2020-12-28 LAB — PREPARE RBC (CROSSMATCH)

## 2020-12-28 MED ORDER — ACETAMINOPHEN 325 MG PO TABS
650.0000 mg | ORAL_TABLET | Freq: Once | ORAL | Status: AC
  Administered 2020-12-28: 650 mg via ORAL
  Filled 2020-12-28: qty 2

## 2020-12-28 MED ORDER — CEPHALEXIN 500 MG PO CAPS: 500.0000 mg | ORAL_CAPSULE | Freq: Two times a day (BID) | ORAL | 0 refills | Status: AC

## 2020-12-28 MED ORDER — DIPHENHYDRAMINE HCL 25 MG PO CAPS
25.0000 mg | ORAL_CAPSULE | Freq: Once | ORAL | Status: AC
  Administered 2020-12-28: 25 mg via ORAL
  Filled 2020-12-28: qty 1

## 2020-12-28 MED ORDER — SODIUM CHLORIDE 0.9% IV SOLUTION
250.0000 mL | Freq: Once | INTRAVENOUS | Status: AC
  Administered 2020-12-28: 250 mL via INTRAVENOUS
  Filled 2020-12-28: qty 250

## 2020-12-28 NOTE — Progress Notes (Signed)
Here for follow-up. States that he is moving to [place] this weekend. He wanted to make sure he didn't need a blood transfusion before the move.

## 2020-12-28 NOTE — Addendum Note (Signed)
Addended by: Altha Harm R on: 12/28/2020 12:25 PM   Modules accepted: Orders

## 2020-12-28 NOTE — Progress Notes (Addendum)
[facility]  [phone] [phone]   Name: James [name] Date: 12/28/2020 MRN: [id number]  DOB: [birth year]  Patient Care Team: [name], MD as PCP - General (Internal Medicine) Cammie Sickle, MD as Consulting Physician (Hematology and Oncology) Mercer Peifer, Kirt Boys, NP as Nurse Practitioner (Hospice and Palliative Medicine) Verlon Au, NP as Nurse Practitioner (Hematology and Oncology) Jacquelin Hawking, NP as Nurse Practitioner (Oncology) Lonell Face, NP as Nurse Practitioner (Neurosurgery) Milinda Pointer, MD as Consulting Physician (Pain Medicine)    REASON FOR CONSULTATION: James [name] is a 70 y.o. male with multiple medical problems including stage IV CLL (initially diagnosed in 2006) most recently treated with obinutuzumab.  Patient has had severe and chronic abdominal pain of unclear etiology.  He has had extensive work-up including referral to [facility].  He has been followed by interventional pain management and is status post intrathecal pain pump (placed 02/07/2020).  Patient was hospitalized 11/21/2020 to 11/25/2020 with symptomatic anemia thought secondary to slow GI bleed with AVM noted on EGD.  He was again hospitalized 11/30/2020 to 12/05/2020 with sepsis from HCAP.  Patient was again readmitted 12/15/2020 - 12/20/2020 with syncope and AKI.  He was referred to palliative care to help address goals and manage ongoing symptoms.  SOCIAL HISTORY:     reports that he has been smoking cigarettes. He has a 23.50 pack-year smoking history. He has never used smokeless tobacco. He reports current alcohol use of about 1.0 standard drink per week. He reports that he does not use drugs.   Patient is married and lives at home with his wife.  He has a son in [place].  Patient formally worked as a Administrator.  ADVANCE DIRECTIVES:  Does not have  CODE STATUS: DNR/DNI (MOST form completed on  07/08/19  PAST MEDICAL HISTORY: Past Medical History:  Diagnosis Date   CLL (chronic lymphocytic leukemia) ([place])    Constipation    Depression    Diabetes mellitus without complication (HCC)    GERD (gastroesophageal reflux disease)    Hematuria    Hyperlipidemia    Hypertension    Lactic acidosis 12/15/2020   Therapeutic opioid induced constipation     PAST SURGICAL HISTORY:  Past Surgical History:  Procedure Laterality Date   CATARACT EXTRACTION W/ INTRAOCULAR LENS IMPLANT Bilateral    CHOLECYSTECTOMY  1983   COLONOSCOPY WITH PROPOFOL N/A 10/27/2017   Procedure: COLONOSCOPY WITH PROPOFOL;  Surgeon: Manya Silvas, MD;  Location: [facility] ENDOSCOPY;  Service: Endoscopy;  Laterality: N/A;   ESOPHAGOGASTRODUODENOSCOPY (EGD) WITH PROPOFOL N/A 11/20/2016   Procedure: ESOPHAGOGASTRODUODENOSCOPY (EGD) WITH PROPOFOL;  Surgeon: Manya Silvas, MD;  Location: [facility] ENDOSCOPY;  Service: Endoscopy;  Laterality: N/A;   ESOPHAGOGASTRODUODENOSCOPY (EGD) WITH PROPOFOL N/A 10/27/2017   Procedure: ESOPHAGOGASTRODUODENOSCOPY (EGD) WITH PROPOFOL;  Surgeon: Manya Silvas, MD;  Location: [facility] ENDOSCOPY;  Service: Endoscopy;  Laterality: N/A;   ESOPHAGOGASTRODUODENOSCOPY (EGD) WITH PROPOFOL N/A 11/23/2020   Procedure: ESOPHAGOGASTRODUODENOSCOPY (EGD) WITH PROPOFOL;  Surgeon: Jonathon Bellows, MD;  Location: [facility] ENDOSCOPY;  Service: Gastroenterology;  Laterality: N/A;   INTRATHECAL PUMP IMPLANT Left 02/07/2020   Procedure: INTRATHECAL PUMP & CATHETER IMPLANT;  Surgeon: Deetta Perla, MD;  Location: [facility] ORS;  Service: Neurosurgery;  Laterality: Left;   LOWER EXTREMITY ANGIOGRAPHY Right 05/19/2017   Procedure: LOWER EXTREMITY ANGIOGRAPHY;  Surgeon: Algernon Huxley, MD;  Location: [facility] CV LAB;  Service: Cardiovascular;  Laterality: Right;   LOWER EXTREMITY ANGIOGRAPHY Left 08/10/2018  Procedure: LOWER EXTREMITY ANGIOGRAPHY;  Surgeon: Algernon Huxley, MD;  Location: [facility] CV LAB;  Service:  Cardiovascular;  Laterality: Left;    HEMATOLOGY/ONCOLOGY HISTORY:  Oncology History Overview Note  # 2006- CLL STAGE IV; MAY 2011- WBC- 57K;Platelets-99;Hb-12/CT Bulky LN; BMBx- 80% Invol; del 11; START Benda-Ritux x4 [finished Sep 2011];   # July 2015-Progression; Sep 2015-START ibrutinib; CT scan DEC 2015- Improvement LN; Cont Ibrutinib $RemoveBefore'140mg'XTzgyEUavEdLh$ /d; NOV 2016 CT- 1-2CM LN [mild progression compared to Dec 2015];NOV 2016- FISH peripheral blood- NO MUTATIONS/CD-38 Positive; NOV 7th- CONT IBRUTINIB 2 pills/day; CT AUG 2017- STABLE;  DEC 6th PET- Mild RP LN/ Retrocrural LN  # OFF ibrutinib [? intol]- sep 2019- Jan 16th 2020; Re-start Ibrutinib; September 2020-stop ibrutinib [poor tolerance/worsening anemia]  # Jan 16th 2020- start aranesp; HOLD while on [place].   # MARCH 11th 2021- Gazyva x 6 finished AUG 2021.   #April May 2022-worsening anemia- BMBx- CLL; cytogenetics: 20 q del [incidental]; rFISH- 17p/11q DEL  # Early May 2022- START IBRUTINIB 420 mg/day  # SEP 2021- prostate enlargement on CT scan- UA-NEG; PSA- WNL; s/p Dr.Wolff.    # DEC 2019- PAIN CONTRACT  # PALLIATIVE CARE- 06/21/2019-  # October 2019-bone marrow biopsy [worsening anemia]-question dyserythropoietic changes/small clone of CLL;  # [facility] HEM- NEG.  SA skin infection [Oct 2016] s/p clinda -------------------------------------------------------    DIAGNOSIS: CLL  STAGE:   IV      ;GOALS: palliative  CURRENT/MOST RECENT THERAPY : Ibrutinib    CLL (chronic lymphocytic leukemia) ([place])  07/08/2019 -  Chemotherapy   The patient had obinutuzumab (GAZYVA) 100 mg in sodium chloride 0.9 % 100 mL (0.9615 mg/mL) chemo infusion, 100 mg, Intravenous, Once, 6 of 6 cycles Administration: 100 mg (07/08/2019), 900 mg (07/09/2019), 1,000 mg (07/26/2019), 1,000 mg (08/16/2019), 1,000 mg (08/02/2019), 1,000 mg (09/13/2019), 1,000 mg (10/11/2019), 1,000 mg (11/09/2019), 1,000 mg (12/07/2019)  for chemotherapy treatment.       ALLERGIES:  has No Known Allergies.  MEDICATIONS:  Current Outpatient Medications  Medication Sig Dispense Refill   AMBULATORY NON FORMULARY MEDICATION Medication Name: Intrathecal pump Bupivacaine 20.0 mg/ml Fentanyl 2,000.0 mcg/ml Dose 1846.4 mcg/day     clopidogrel (PLAVIX) 75 MG tablet Take 1 tablet by mouth once daily 90 tablet 0   Ensure (ENSURE) Take 237 mLs by mouth 3 (three) times daily between meals.      folic acid (FOLVITE) 1 MG tablet Take 1 tablet (1 mg total) by mouth daily. 90 tablet 1   gabapentin (NEURONTIN) 300 MG capsule Take 1 capsule (300 mg total) by mouth 2 (two) times daily. 60 capsule 1   metFORMIN (GLUCOPHAGE) 1000 MG tablet Take 1 tablet (1,000 mg total) by mouth daily with breakfast. 30 tablet 3   Multiple Vitamin (MULTIVITAMIN WITH MINERALS) TABS tablet Take 1 tablet by mouth daily. 30 tablet 0   naloxegol oxalate (MOVANTIK) 25 MG TABS tablet Take 1 tablet (25 mg total) by mouth daily. 30 tablet 11   naloxone (NARCAN) nasal spray 4 mg/0.1 mL Place 1 spray into the nose as needed for up to 365 doses (for opioid-induced respiratory depresssion). In case of emergency (overdose), spray once into each nostril. If no response within 3 minutes, repeat application and call [phone]. 1 each 0   [START ON 01/07/2021] Oxycodone HCl 10 MG TABS Take 1 tablet (10 mg total) by mouth every 4 (four) hours as needed. Must last 30 days 180 tablet 0   pantoprazole (PROTONIX) 40 MG tablet Take 40 mg  by mouth at bedtime.     hydrocortisone (CORTEF) 10 MG tablet Take 3 tablets (30 mg total) by mouth daily. (Patient not taking: Reported on 12/28/2020) 30 tablet 0   hydrocortisone (CORTEF) 20 MG tablet Take 1 tablet (20 mg total) by mouth every evening. (Patient not taking: Reported on 12/28/2020) 30 tablet 0   [START ON 02/06/2021] Oxycodone HCl 10 MG TABS Take 1 tablet (10 mg total) by mouth every 4 (four) hours as needed. Must last 30 days 180 tablet 0   [START ON 03/08/2021] Oxycodone HCl  10 MG TABS Take 1 tablet (10 mg total) by mouth every 4 (four) hours as needed. Must last 30 days 180 tablet 0   [START ON 04/07/2021] Oxycodone HCl 10 MG TABS Take 1 tablet (10 mg total) by mouth every 4 (four) hours as needed. Must last 30 days 180 tablet 0   No current facility-administered medications for this visit.   Facility-Administered Medications Ordered in Other Visits  Medication Dose Route Frequency Provider Last Rate Last Admin   0.9 %  sodium chloride infusion (Manually program via Guardrails IV Fluids)  250 mL Intravenous Once Cammie Sickle, MD       acetaminophen (TYLENOL) tablet 650 mg  650 mg Oral Once Cammie Sickle, MD       diphenhydrAMINE (BENADRYL) capsule 25 mg  25 mg Oral Once Cammie Sickle, MD        VITAL SIGNS: BP (!) 163/86   Pulse 96   Temp 97.6 F (36.4 C) (Tympanic)   Resp 18   Wt 138 lb (62.6 kg)   BMI 20.98 kg/m  Filed Weights   12/28/20 0943  Weight: 138 lb (62.6 kg)    Estimated body mass index is 20.98 kg/m as calculated from the following:   Height as of 12/26/20: _0  (1.727 m).   Weight as of this encounter: 138 lb (62.6 kg).  LABS: CBC:    Component Value Date/Time   WBC 10.6 (H) 12/28/2020 0854   HGB 7.9 (L) 12/28/2020 0854   HGB 11.4 (L) 08/09/2014 1122   HCT 26.2 (L) 12/28/2020 0854   HCT 35.6 (L) 08/09/2014 1122   PLT 158 12/28/2020 0854   PLT 95 (L) 08/09/2014 1122   MCV 106.1 (H) 12/28/2020 0854   MCV 100 08/09/2014 1122   NEUTROABS 3.0 12/28/2020 0854   NEUTROABS 4.3 08/09/2014 1122   LYMPHSABS 6.2 (H) 12/28/2020 0854   LYMPHSABS 6.5 (H) 08/09/2014 1122   MONOABS 1.3 (H) 12/28/2020 0854   MONOABS 1.1 (H) 08/09/2014 1122   EOSABS 0.0 12/28/2020 0854   EOSABS 0.1 08/09/2014 1122   BASOSABS 0.0 12/28/2020 0854   BASOSABS 0.1 08/09/2014 1122   Comprehensive Metabolic Panel:    Component Value Date/Time   NA 144 12/28/2020 0854   NA 142 05/03/2014 1139   K 4.9 12/28/2020 0854   K 4.7  05/03/2014 1139   CL 110 12/28/2020 0854   CL 110 (H) 05/03/2014 1139   CO2 25 12/28/2020 0854   CO2 24 05/03/2014 1139   BUN 20 12/28/2020 0854   BUN 20 (H) 05/03/2014 1139   CREATININE 1.10 12/28/2020 0854   CREATININE 1.22 08/09/2014 1122   GLUCOSE 190 (H) 12/28/2020 0854   GLUCOSE 98 05/03/2014 1139   CALCIUM 8.6 (L) 12/28/2020 0854   CALCIUM 8.6 05/03/2014 1139   AST 27 12/19/2020 0520   AST 15 05/03/2014 1139   ALT 33 12/19/2020 0520   ALT 26 05/03/2014 1139  ALKPHOS 59 12/19/2020 0520   ALKPHOS 94 05/03/2014 1139   BILITOT 0.7 12/19/2020 0520   BILITOT 0.5 05/03/2014 1139   PROT 5.0 (L) 12/19/2020 0520   PROT 7.5 05/03/2014 1139   ALBUMIN 3.0 (L) 12/19/2020 0520   ALBUMIN 4.1 05/03/2014 1139    RADIOGRAPHIC STUDIES: CT ABDOMEN PELVIS W CONTRAST  Result Date: 11/30/2020 CLINICAL DATA:  Epigastric pain for 5 days.  Generalized weakness EXAM: CT ABDOMEN AND PELVIS WITH CONTRAST TECHNIQUE: Multidetector CT imaging of the abdomen and pelvis was performed using the standard protocol following bolus administration of intravenous contrast. CONTRAST:  27mL OMNIPAQUE IOHEXOL 350 MG/ML SOLN COMPARISON:  11/21/2020 FINDINGS: Lower chest: New mixed density airspace opacity within the left lower lobe. Right lung bases clear. Heart size is normal. Hepatobiliary: Status post cholecystectomy. Similar degree of intrahepatic biliary dilatation. No focal liver lesion is identified. Pancreas: Unremarkable. No pancreatic ductal dilatation or surrounding inflammatory changes. Spleen: Spleen is upper limits of normal in size without focal lesion. Adrenals/Urinary Tract: Unremarkable adrenal glands. Stable bilateral renal cysts. Two tiny right-sided renal stones without hydronephrosis. No left-sided renal stone or hydronephrosis. Urinary bladder is mildly distended but appears otherwise unremarkable. Stomach/Bowel: Stomach is within normal limits. Appendix appears normal. No evidence of bowel wall  thickening, distention, or inflammatory changes. Vascular/Lymphatic: Advanced atherosclerosis throughout the aortoiliac axis with bi-iliac stents. No aneurysm. Similar degree of retroperitoneal lymphadenopathy. Reference left para-aortic nodal mass measuring approximately 4.3 x 2.4 cm (series 3, image 36), unchanged. Reproductive: Prostatomegaly. Other: No free fluid. No abdominopelvic fluid collection. No pneumoperitoneum. No abdominal wall hernia. Musculoskeletal: No acute or significant osseous findings. Neurostimulator is again seen entering at the L3-4 level and extending into the thoracic region, beyond the field of view. IMPRESSION: 1. New airspace opacity within the left lower lobe compatible with pneumonia. 2. Stable retroperitoneal lymphadenopathy. 3. Nonobstructing right nephrolithiasis. 4. Prostatomegaly. 5. Aortic atherosclerosis (ICD10-I70.0). Electronically Signed   By: Davina Poke D.O.   On: 11/30/2020 15:31   DG Chest Port 1 View  Result Date: 12/15/2020 CLINICAL DATA:  Follow-up pneumonia.  Syncope.  History of leukemia EXAM: PORTABLE CHEST 1 VIEW COMPARISON:  11/30/2020 FINDINGS: Normal heart size. Atherosclerotic calcification of the aortic knob. Interval resolution of previously seen left lower lobe pneumonia. No focal airspace consolidation, pleural effusion, or pneumothorax. IMPRESSION: No acute cardiopulmonary abnormality. Electronically Signed   By: Davina Poke D.O.   On: 12/15/2020 11:24   DG Chest Port 1 View  Result Date: 11/30/2020 CLINICAL DATA:  Questionable sepsis. EXAM: PORTABLE CHEST 1 VIEW COMPARISON:  Chest x-ray 11/21/2020, CT chest 01/24/2020 FINDINGS: The heart size and mediastinal contours are unchanged. Aortic calcification. Interval development of patchy airspace opacity of the left lower lobe. No pulmonary edema. No pleural effusion. No pneumothorax. No acute osseous abnormality. Degenerative changes of bilateral acromioclavicular joints. IMPRESSION: Left  lower lobe pneumonia/aspiration pneumonia Followup PA and lateral chest X-ray is recommended in 3-4 weeks following therapy to ensure resolution and exclude underlying malignancy. Electronically Signed   By: Iven Finn M.D.   On: 11/30/2020 15:34     PERFORMANCE STATUS (ECOG) : 1 - Symptomatic but completely ambulatory  Review of Systems Unless otherwise noted, a complete review of systems is negative.  Physical Exam General: NAD Pulmonary: Clear anterior/posterior fields Cardiac: RRR Abdomen: soft, nontender, + bowel sounds GU: no suprapubic tenderness Extremities: no edema, no joint deformities Skin: no rashes Neurological: Weakness but otherwise nonfocal  IMPRESSION: Post hospital follow-up.  Patient reports that he is doing  well.  He denies any significant changes or concerns.  No changes to pain.  Patient saw Dr. Dossie Arbour on 8/30 and intrathecal pump was refilled.  Patient plans to try to establish care locally in [place] when he moves there this weekend.  Labs reviewed.  Hemoglobin low but stable.  Discussed with Dr. Rogue Bussing and we will proceed with transfusion today.  Again, patient plans to move to [place] and will establish care locally with hematology/oncology.  Hospice has also been discussed as an option but patient would prefer to continue transfusions for now.  Patient complained of dysuria since discharging from hospital. No fever or chills or other urinary symptoms. UA grossly positive for UTI. Will send for culture and sensitivities.  Will start empirically on antibiotic.  PLAN: -Continue current scope of treatment -Transfuse 1 unit packed red blood cells today -Start Keflex 500 mg every 12 hours x7 days -Patient moving to [place] with plan to transfer care -Follow-up as needed  Case and plan discussed with Dr. Rogue Bussing  Patient expressed understanding and was in agreement with this plan. He also understands that He can call the clinic at any time with any  questions, concerns, or complaints.     Time Total: 15 minutes  Visit consisted of counseling and education dealing with the complex and emotionally intense issues of symptom management and palliative care in the setting of serious and potentially life-threatening illness.Greater than 50%  of this time was spent counseling and coordinating care related to the above assessment and plan.  Signed by: Altha Harm, PhD, NP-C

## 2020-12-28 NOTE — Patient Instructions (Signed)
[facility] ONCOLOGY   Discharge Instructions: Thank you for choosing [facility] to provide your oncology and hematology care.  If you have a lab appointment with the [facility], please go directly to the [facility] and check in at the registration area.  We strive to give you quality time with your provider. You may need to reschedule your appointment if you arrive late (15 or more minutes).  Arriving late affects you and other patients whose appointments are after yours.  Also, if you miss three or more appointments without notifying the office, you may be dismissed from the clinic at the provider's discretion.      For prescription refill requests, have your pharmacy contact our office and allow 72 hours for refills to be completed.    Today you received the following: Blood Transfusion.      To help prevent nausea and vomiting after your treatment, we encourage you to take your nausea medication as directed.  BELOW ARE SYMPTOMS THAT SHOULD BE REPORTED IMMEDIATELY: *FEVER GREATER THAN 100.4 F (38 C) OR HIGHER *CHILLS OR SWEATING *NAUSEA AND VOMITING THAT IS NOT CONTROLLED WITH YOUR NAUSEA MEDICATION *UNUSUAL SHORTNESS OF BREATH *UNUSUAL BRUISING OR BLEEDING *URINARY PROBLEMS (pain or burning when urinating, or frequent urination) *BOWEL PROBLEMS (unusual diarrhea, constipation, pain near the anus) TENDERNESS IN MOUTH AND THROAT WITH OR WITHOUT PRESENCE OF ULCERS (sore throat, sores in mouth, or a toothache) UNUSUAL RASH, SWELLING OR PAIN  UNUSUAL VAGINAL DISCHARGE OR ITCHING   Items with * indicate a potential emergency and should be followed up as soon as possible or go to the Emergency Department if any problems should occur.  Please show the CHEMOTHERAPY ALERT CARD or IMMUNOTHERAPY ALERT CARD at check-in to the Emergency Department and triage nurse.  Should you have questions after your visit or need to cancel or reschedule your  appointment, please contact [facility]  [phone] and follow the prompts.  Office hours are 8:00 a.m. to 4:30 p.m. Monday - Friday. Please note that voicemails left after 4:00 p.m. may not be returned until the following business day.  We are closed weekends and major holidays. You have access to a nurse at all times for urgent questions. Please call the main number to the clinic [phone] and follow the prompts.  For any non-urgent questions, you may also contact your provider using MyChart. We now offer e-Visits for anyone 30 and older to request care online for non-urgent symptoms. For details visit [email or web address].   Also download the MyChart app! Go to the app store, search "MyChart", open the app, select [facility], and log in with your MyChart username and password.  Due to Covid, a mask is required upon entering the hospital/clinic. If you do not have a mask, one will be given to you upon arrival. For doctor visits, patients may have 1 support person aged 61 or older with them. For treatment visits, patients cannot have anyone with them due to current Covid guidelines and our immunocompromised population.

## 2020-12-29 LAB — BPAM RBC
Blood Product Expiration Date: 202209202359
ISSUE DATE / TIME: 202209011137
Unit Type and Rh: 6200

## 2020-12-29 LAB — TYPE AND SCREEN
ABO/RH(D): A POS
Antibody Screen: NEGATIVE
Unit division: 0

## 2020-12-29 LAB — THYROID PANEL WITH TSH
Free Thyroxine Index: 1.6 (ref 1.2–4.9)
T3 Uptake Ratio: 28 % (ref 24–39)
T4, Total: 5.6 ug/dL (ref 4.5–12.0)
TSH: 0.5 u[IU]/mL (ref 0.450–4.500)

## 2020-12-30 ENCOUNTER — Telehealth: Payer: Self-pay | Admitting: Hospice and Palliative Medicine

## 2020-12-30 LAB — TOXASSURE SELECT 13 (MW), URINE

## 2020-12-30 LAB — URINE CULTURE: Culture: >=100000 — AB

## 2020-12-30 MED ORDER — SULFAMETHOXAZOLE-TRIMETHOPRIM 800-160 MG PO TABS: 1.0000 | ORAL_TABLET | Freq: Two times a day (BID) | ORAL | 0 refills | Status: AC

## 2020-12-30 NOTE — Telephone Encounter (Signed)
Urine culture + CITROBACTER FREUNDII that is resistant to cephalosporins. Called and spoke with patient/son. Will switch abx to Bactrim DS x 7 days. New Rx sent to [facility] [address] in [place], [place].

## 2021-01-02 ENCOUNTER — Telehealth: Payer: Self-pay

## 2021-01-12 ENCOUNTER — Telehealth: Payer: Self-pay | Admitting: Pain Medicine

## 2021-01-12 NOTE — Telephone Encounter (Signed)
[name] [phone] ask someone to call about James [name] per vmail 4-13 01-11-21

## 2021-01-12 NOTE — Telephone Encounter (Signed)
[phone] 

## 2021-01-12 NOTE — Telephone Encounter (Signed)
James [name] states that the physicians typically do not take pump patients who had their pumps put in by another physician.  She was going to check with the physicians and call us back.

## 2021-01-16 ENCOUNTER — Telehealth: Payer: Self-pay

## 2021-01-16 MED ORDER — PAIN MANAGEMENT IT PUMP REFILL: 1.0000 | Freq: Once | INTRATHECAL | 0 refills | Status: AC

## 2021-01-22 NOTE — Progress Notes (Deleted)
Patient is no-show to pump refill.

## 2021-01-23 ENCOUNTER — Ambulatory Visit: Payer: Medicare Other | Attending: Pain Medicine | Admitting: Pain Medicine

## 2021-01-23 ENCOUNTER — Other Ambulatory Visit: Payer: Self-pay

## 2021-01-23 DIAGNOSIS — G894 Chronic pain syndrome: Secondary | ICD-10-CM

## 2021-01-23 DIAGNOSIS — G893 Neoplasm related pain (acute) (chronic): Secondary | ICD-10-CM

## 2021-01-23 DIAGNOSIS — Z95828 Presence of other vascular implants and grafts: Secondary | ICD-10-CM

## 2021-01-23 DIAGNOSIS — G8929 Other chronic pain: Secondary | ICD-10-CM

## 2021-01-23 DIAGNOSIS — Z451 Encounter for adjustment and management of infusion pump: Secondary | ICD-10-CM

## 2021-01-23 DIAGNOSIS — Z79899 Other long term (current) drug therapy: Secondary | ICD-10-CM

## 2021-01-23 DIAGNOSIS — Z978 Presence of other specified devices: Secondary | ICD-10-CM

## 2021-01-23 DIAGNOSIS — R1013 Epigastric pain: Secondary | ICD-10-CM

## 2021-01-23 DIAGNOSIS — C8300 Small cell B-cell lymphoma, unspecified site: Secondary | ICD-10-CM

## 2021-01-23 DIAGNOSIS — C9110 Chronic lymphocytic leukemia of B-cell type not having achieved remission: Secondary | ICD-10-CM

## 2021-01-23 MED ORDER — PAIN MANAGEMENT IT PUMP REFILL: 1.0000 | Freq: Once | INTRATHECAL | 0 refills | Status: AC

## 2021-01-23 MED FILL — Medication: INTRATHECAL | Qty: 1 | Status: CN

## 2021-01-29 ENCOUNTER — Other Ambulatory Visit: Payer: Self-pay

## 2021-01-29 MED ORDER — PAIN MANAGEMENT IT PUMP REFILL: 1.0000 | Freq: Once | INTRATHECAL | 0 refills | Status: AC

## 2021-01-29 NOTE — Progress Notes (Signed)
PROVIDER NOTE: Information contained herein reflects review and annotations entered in association with encounter. Interpretation of such information and data should be left to medically-trained personnel. Information provided to patient can be located elsewhere in the medical record under "Patient Instructions". Document created using STT-dictation technology, any transcriptional errors that may result from process are unintentional.    Patient: James [name]  Service Category: Procedure  Provider: Gaspar Cola, MD  DOB: 1950/07/01  DOS: 01/30/2021  Location: [facility]  MRN: [id number]  Setting: Ambulatory - outpatient  Referring Provider: [name], MD  Type: Established Patient  Specialty: Interventional Pain Management  PCP: [name], MD   Primary Reason for Visit: Interventional Pain Management Treatment. CC: Abdominal Pain   Procedure:          Type: Management of infusion pump - Reservoir Refill [phone]). No rate change.    Indications: 1. Cancer-related pain   2. Chronic pain syndrome   3. Chronic epigastric pain (1ry area of Pain)   4. Chronic abdominal pain   5. CLL (chronic lymphocytic leukemia) ([place])   6. Malignant lymphoma, small lymphocytic (H[facility])   7. Abdominal wall pain in epigastric region   8. Presence of implanted infusion pump (Medtronic)   9. Presence of intrathecal pump (Medtronic)   10. Encounter for adjustment and management of infusion pump   11. Pharmacologic therapy   12. Chronic use of opiate for therapeutic purpose   13. Encounter for medication management   14. Encounter for therapeutic procedure    Pain Assessment: Self-Reported Pain Score: 5 /10             Reported level is compatible with observation.        Today we were able to get him scheduled into his new pain clinic: scheduled in [place] [address] [place] [address] [phone]  February 28, 2021 @ 0900.  Please arrive 0830  Dr. Truman Hayward   Today we will be sending his medication refills to his new pharmacy in [place].    Pharmacotherapy Assessment   Analgesic: Oxycodone IR 72m, 1 tab PO q 4 hrs (60 mg/day of oxycodone IR) (90 MME) Highest recorded MME/day: 150 mg/day 90 day average MME/day: 632.27 mg/day, according to PMP   Monitoring: [place] PMP: PDMP not reviewed this encounter.       Pharmacotherapy: No side-effects or adverse reactions reported. Compliance: No problems identified. Effectiveness: Clinically acceptable. Plan: Refer to "POC". UDS:  Summary  Date Value Ref Range Status  12/26/2020 Note  Final    Comment:    ==================================================================== [employer] Select 13 (MW) ==================================================================== Test                             Result       Flag       Units  Drug Present and Declared for Prescription Verification   Oxycodone                      4722         EXPECTED   ng/mg creat   Oxymorphone                    3390         EXPECTED   ng/mg creat   Noroxycodone  3610         EXPECTED   ng/mg creat   Noroxymorphone                 737          EXPECTED   ng/mg creat    Sources of oxycodone are scheduled prescription medications.    Oxymorphone, noroxycodone, and noroxymorphone are expected    metabolites of oxycodone. Oxymorphone is also available as a    scheduled prescription medication.    Fentanyl                       42           EXPECTED   ng/mg creat   Norfentanyl                    154          EXPECTED   ng/mg creat    Source of fentanyl is a scheduled prescription medication, including    IV, patch, and transmucosal formulations. Norfentanyl is an expected    metabolite of fentanyl.  Drug Present not Declared for Prescription Verification   Carboxy-THC                    >1020        UNEXPECTED ng/mg creat    Carboxy-THC is a metabolite of  tetrahydrocannabinol (THC). Source of    THC is most commonly herbal marijuana or marijuana-based products,    but THC is also present in a scheduled prescription medication.    Trace amounts of THC can be present in hemp and cannabidiol (CBD)    products. This test is not intended to distinguish between delta-9-    tetrahydrocannabinol, the predominant form of THC in most herbal or    marijuana-based products, and delta-8-tetrahydrocannabinol.  ==================================================================== Test                      Result    Flag   Units      Ref Range   Creatinine              98               mg/dL      >=20 ==================================================================== Declared Medications:  The flagging and interpretation on this report are based on the  following declared medications.  Unexpected results may arise from  inaccuracies in the declared medications.   **Note: The testing scope of this panel includes these medications:   Fentanyl  Oxycodone   **Note: The testing scope of this panel does not include the  following reported medications:   Bupivacaine  Clopidogrel (Plavix)  Folic Acid  Gabapentin (Neurontin)  Hydrocortisone (Cortef)  Metformin (Glucophage)  Multivitamin  Naloxegol (Movantik)  Naloxone (Narcan)  Pantoprazole (Protonix)  Supplement ==================================================================== For clinical consultation, please call [phone]. ====================================================================          IDDS (Intrathecal Drug Delivery System):   Device:  Manufacturer: Medtronic Synchromed II Model: [id number] Serial No.: [id number] Type: Programmable  Volume: 40 mL reservoir Catheter Placement: Intrathecal Catheter Level: T7-8 MRI compatibility: Yes   Implant Details:  Implant Date: 02/07/2020 Implanter: Deetta Perla, MD [facility]) Intrathecal Access: L3-4 Delivery Route:  Intrathecal Site: Abdominal Laterality: Left  Content:  1ry Medication Class: Opioid 1ry Medication (Concentration): PF-Fentanyl (2000 mcg/mL)  2ry Medication (Concentration): PF-Bupivacaine (20 mg/mL)  3ry Medication (Concentration): None  PTM (PCA) mode:  PTM dose: 25 mcg bolus Duration of bolus: 1 minute Lockout interval: 4 hours Maximum 24-hour doses: 6  Programming:  Type: Simple continuous with PCA.  Medication, Concentration, Infusion Program, & Delivery Rate: For up-to-date details please see most recent scanned programming printout.    Changes:  Medication Change: None at this point Rate Change: No change in rate  Reported side-effects or adverse reactions: None reported  Effectiveness: Described as relatively effective, allowing for increase in activities of daily living (ADL) Clinically meaningful improvement in function (CMIF): Sustained CMIF goals met  Plan: Pump refill today   Pre-op H&P Assessment:  Mr. Shrum is a 70 y.o. (year old), male patient, seen today for interventional treatment. He  has a past surgical history that includes Cholecystectomy (1983); Esophagogastroduodenoscopy (egd) with propofol (N/A, 11/20/2016); Cataract extraction w/ intraocular lens implant (Bilateral); Lower Extremity Angiography (Right, 05/19/2017); Esophagogastroduodenoscopy (egd) with propofol (N/A, 10/27/2017); Colonoscopy with propofol (N/A, 10/27/2017); Lower Extremity Angiography (Left, 08/10/2018); Intrathecal pump implant (Left, 02/07/2020); and Esophagogastroduodenoscopy (egd) with propofol (N/A, 11/23/2020). Mr. Fendley has a current medication list which includes the following prescription(s): AMBULATORY NON FORMULARY MEDICATION, cephalexin, clopidogrel, ensure, folic acid, gabapentin, hydrocortisone, hydrocortisone, metformin, multivitamin with minerals, naloxegol oxalate, naloxone, pantoprazole, sulfamethoxazole-trimethoprim, [START ON 02/06/2021] oxycodone hcl, [START ON 03/08/2021]  oxycodone hcl, and [START ON 04/07/2021] oxycodone hcl. His primarily concern today is the Abdominal Pain  Initial Vital Signs:  Pulse/HCG Rate: 99  Temp: (!) 97.1 F (36.2 C) Resp: 16 BP: (!) 125/48 SpO2: 99 %  BMI: Estimated body mass index is 22.66 kg/m as calculated from the following:   Height as of this encounter: _0  (1.727 m).   Weight as of this encounter: 149 lb (67.6 kg).  Risk Assessment: Allergies: Reviewed. He has No Known Allergies.  Allergy Precautions: None required Coagulopathies: Reviewed. None identified.  Blood-thinner therapy: None at this time Active Infection(s): Reviewed. None identified. Mr. Kirby is afebrile  Site Confirmation: Mr. Simkins was asked to confirm the procedure and laterality before marking the site Procedure checklist: Completed Consent: Before the procedure and under the influence of no sedative(s), amnesic(s), or anxiolytics, the patient was informed of the treatment options, risks and possible complications. To fulfill our ethical and legal obligations, as recommended by the American Medical Association's Code of Ethics, I have informed the patient of my clinical impression; the nature and purpose of the treatment or procedure; the risks, benefits, and possible complications of the intervention; the alternatives, including doing nothing; the risk(s) and benefit(s) of the alternative treatment(s) or procedure(s); and the risk(s) and benefit(s) of doing nothing.  Mr. Kolenovic was provided with information about the general risks and possible complications associated with most interventional procedures. These include, but are not limited to: failure to achieve desired goals, infection, bleeding, organ or nerve damage, allergic reactions, paralysis, and/or death.  In addition, he was informed of those risks and possible complications associated to this particular procedure, which include, but are not limited to: damage to the implant; failure to  decrease pain; local, systemic, or serious CNS infections, intraspinal abscess with possible cord compression and paralysis, or life-threatening such as meningitis; bleeding; organ damage; nerve injury or damage with subsequent sensory, motor, and/or autonomic system dysfunction, resulting in transient or permanent pain, numbness, and/or weakness of one or several areas of the body; allergic reactions, either minor or major life-threatening, such as anaphylactic or anaphylactoid reactions.  Furthermore, Mr. Slocumb was informed of those risks and complications associated with the medications. These include, but are  not limited to: allergic reactions (i.e.: anaphylactic or anaphylactoid reactions); endorphine suppression; bradycardia and/or hypotension; water retention and/or peripheral vascular relaxation leading to lower extremity edema and possible stasis ulcers; respiratory depression and/or shortness of breath; decreased metabolic rate leading to weight gain; swelling or edema; medication-induced neural toxicity; particulate matter embolism and blood vessel occlusion with resultant organ, and/or nervous system infarction; and/or intrathecal granuloma formation with possible spinal cord compression and permanent paralysis.  Before refilling the pump Mr. Kaltenbach was informed that some of the medications used in the devise may not be FDA approved for such use and therefore it constitutes an off-label use of the medications.  Finally, he was informed that Medicine is not an exact science; therefore, there is also the possibility of unforeseen or unpredictable risks and/or possible complications that may result in a catastrophic outcome. The patient indicated having understood very clearly. We have given the patient no guarantees and we have made no promises. Enough time was given to the patient to ask questions, all of which were answered to the patient's satisfaction. Mr. Teixeira has indicated that he wanted to  continue with the procedure. Attestation: I, the ordering provider, attest that I have discussed with the patient the benefits, risks, side-effects, alternatives, likelihood of achieving goals, and potential problems during recovery for the procedure that I have provided informed consent. Date  Time: 01/30/2021 12:37 PM  Pre-Procedure Preparation:  Monitoring: As per clinic protocol. Respiration, ETCO2, SpO2, BP, heart rate and rhythm monitor placed and checked for adequate function Safety Precautions: Patient was assessed for positional comfort and pressure points before starting the procedure. Time-out: I initiated and conducted the "Time-out" before starting the procedure, as per protocol. The patient was asked to participate by confirming the accuracy of the "Time Out" information. Verification of the correct person, site, and procedure were performed and confirmed by me, the nursing staff, and the patient. "Time-out" conducted as per Joint Commission's Universal Protocol (UP.01.01.01). Time: 1307  Description of Procedure:          Position: Supine Target Area: Central-port of intrathecal pump. Approach: Anterior, 90 degree angle approach. Area Prepped: Entire Area around the pump implant. DuraPrep (Iodine Povacrylex [0.7% available iodine] and Isopropyl Alcohol, 74% w/w) Safety Precautions: Aspiration looking for blood return was conducted prior to all injections. At no point did we inject any substances, as a needle was being advanced. No attempts were made at seeking any paresthesias. Safe injection practices and needle disposal techniques used. Medications properly checked for expiration dates. SDV (single dose vial) medications used. Description of the Procedure: Protocol guidelines were followed. Two nurses trained to do implant refills were present during the entire procedure. The refill medication was checked by both healthcare providers as well as the patient. The patient was included  in the "Time-out" to verify the medication. The patient was placed in position. The pump was identified. The area was prepped in the usual manner. The sterile template was positioned over the pump, making sure the side-port location matched that of the pump. Both, the pump and the template were held for stability. The needle provided in the Medtronic Kit was then introduced thru the center of the template and into the central port. The pump content was aspirated and discarded volume documented. The new medication was slowly infused into the pump, thru the filter, making sure to avoid overpressure of the device. The needle was then removed and the area cleansed, making sure to leave some of the prepping solution back  to take advantage of its long term bactericidal properties. The pump was interrogated and programmed to reflect the correct medication, volume, and dosage. The program was printed and taken to the physician for approval. Once checked and signed by the physician, a copy was provided to the patient and another scanned into the EMR. Vitals:   01/30/21 1235  BP: (!) 125/48  Pulse: 99  Resp: 16  Temp: (!) 97.1 F (36.2 C)  TempSrc: Temporal  SpO2: 99%  Weight: 149 lb (67.6 kg)  Height: _0  (1.727 m)    Start Time: 1307 hrs. End Time: 1318 hrs. Materials & Medications: Medtronic Refill Kit Medication(s): Please see chart orders for details.  Imaging Guidance:          Type of Imaging Technique: None used Indication(s): N/A Exposure Time: No patient exposure Contrast: None used. Fluoroscopic Guidance: N/A Ultrasound Guidance: N/A Interpretation: N/A  Antibiotic Prophylaxis:   Anti-infectives (From admission, onward)    None      Indication(s): None identified  Post-operative Assessment:  Post-procedure Vital Signs:  Pulse/HCG Rate: 99  Temp: (!) 97.1 F (36.2 C) Resp: 16 BP: (!) 125/48 SpO2: 99 %  EBL: None  Complications: No immediate post-treatment  complications observed by team, or reported by patient.  Note: The patient tolerated the entire procedure well. A repeat set of vitals were taken after the procedure and the patient was kept under observation following institutional policy, for this type of procedure. Post-procedural neurological assessment was performed, showing return to baseline, prior to discharge. The patient was provided with post-procedure discharge instructions, including a section on how to identify potential problems. Should any problems arise concerning this procedure, the patient was given instructions to immediately contact us, at any time, without hesitation. In any case, we plan to contact the patient by telephone for a follow-up status report regarding this interventional procedure.  Comments:  No additional relevant information.  Plan of Care  Orders:  Orders Placed This Encounter  Procedures   PUMP REFILL    Maintain Protocol by having two(2) healthcare providers during procedure and programming.    Scheduling Instructions:     Please refill intrathecal pump today.    Order Specific Question:   Where will this procedure be performed?    Answer:   [facility]   PUMP REFILL    Whenever possible schedule on a procedure today.    Standing Status:   Future    Standing Expiration Date:   06/02/2021    Scheduling Instructions:     Please schedule intrathecal pump refill based on pump programming. Avoid schedule intervals of more than 120 days (4 months).    Order Specific Question:   Where will this procedure be performed?    Answer:   [facility]   Informed Consent Details: Physician/Practitioner Attestation; Transcribe to consent form and obtain patient signature    Transcribe to consent form and obtain patient signature.    Order Specific Question:   Physician/Practitioner attestation of informed consent for procedure/surgical case    Answer:   I, the physician/practitioner, attest that I have  discussed with the patient the benefits, risks, side effects, alternatives, likelihood of achieving goals and potential problems during recovery for the procedure that I have provided informed consent.    Order Specific Question:   Procedure    Answer:   Intrathecal pump refill    Order Specific Question:   Physician/Practitioner performing the procedure    Answer:   Attending  Physician: Kathlen Brunswick. Dossie Arbour, MD & designated trained staff    Order Specific Question:   Indication/Reason    Answer:   Chronic Pain Syndrome (G89.4), presence of an intrathecal pump (Z97.8)    Chronic Opioid Analgesic:  Oxycodone IR 67m, 1 tab PO q 4 hrs (60 mg/day of oxycodone IR) (90 MME) Highest recorded MME/day: 150 mg/day 90 day average MME/day: 632.27 mg/day, according to PMP   Medications ordered for procedure: Meds ordered this encounter  Medications   Oxycodone HCl 10 MG TABS    Sig: Take 1 tablet (10 mg total) by mouth every 4 (four) hours as needed. Must last 30 days    Dispense:  180 tablet    Refill:  0    Not a duplicate. Do NOT delete! Dispense 1 day early if closed on fill date. Warn not to take CNS-depressants 8 hours before or after taking opioid. Do not send refill request. Renewal requires appointment.   Oxycodone HCl 10 MG TABS    Sig: Take 1 tablet (10 mg total) by mouth every 4 (four) hours as needed. Must last 30 days    Dispense:  180 tablet    Refill:  0    Not a duplicate. Do NOT delete! Dispense 1 day early if closed on fill date. Warn not to take CNS-depressants 8 hours before or after taking opioid. Do not send refill request. Renewal requires appointment.   Oxycodone HCl 10 MG TABS    Sig: Take 1 tablet (10 mg total) by mouth every 4 (four) hours as needed. Must last 30 days    Dispense:  180 tablet    Refill:  0    Not a duplicate. Do NOT delete! Dispense 1 day early if closed on fill date. Warn not to take CNS-depressants 8 hours before or after taking opioid. Do not send  refill request. Renewal requires appointment.    Medications administered: Semisi F. Delmont had no medications administered during this visit.  See the medical record for exact dosing, route, and time of administration.  Follow-up plan:   Return for Pump Refill (Max:312mo       Interventional Therapies  Risk  Complexity Considerations:   NOTE: PLAVIX ANTICOAGULATION  (Stop: 7 days  Restart: 2 hrs)   Planned  Pending:   Decrease pump PCA activations from 6/day to 3/day. Therapeutic bilateral splanchnic nerves neurolysis #1 with alcohol  Diagnostic intrathecal catheter test under fluoroscopic guidance to determine location    Under consideration:   Possible revision of intrathecal catheter    Completed:   Diagnostic bilateral celiac plexus block x2 (06/01/2019; 11/02/2019) (50/30/30/0) (75/75/0/0)  Therapeutic bilateral celiac plexus neurolysis with phenol x1 (04/06/2020)  Diagnostic bilateral rectus abdominis trigger point injection x1 (04/06/2020) (0/0/0)  Diagnostic (ML) T12-L1 intrathecal injection of PF-fentanyl 25 mcg as an intrathecal pump trial x1 (12/14/2019)   Therapeutic  Palliative (PRN) options:   Intrathecal pump management       Recent Visits Date Type Provider Dept  12/26/20 Procedure visit NaMilinda PointerMD [facility]  11/28/20 Procedure visit NaMilinda PointerMD [facility]  Showing recent visits within past 90 days and meeting all other requirements Today's Visits Date Type Provider Dept  01/30/21 Procedure visit NaMilinda PointerMD [facility]  Showing today's visits and meeting all other requirements Future Appointments Date Type Provider Dept  02/27/21 Appointment NaMilinda PointerMD [facility]  Showing future appointments within next 90 days and meeting all other requirements Disposition: Discharge  home  Discharge (Date  Time): 01/30/2021; 1431 hrs.   Primary Care Physician: [name],  MD Location: [facility] Note by: Gaspar Cola, MD Date: 01/30/2021; Time: 2:33 PM  Disclaimer:  Medicine is not an Chief Strategy Officer. The only guarantee in medicine is that nothing is guaranteed. It is important to note that the decision to proceed with this intervention was based on the information collected from the patient. The Data and conclusions were drawn from the patient's questionnaire, the interview, and the physical examination. Because the information was provided in large part by the patient, it cannot be guaranteed that it has not been purposely or unconsciously manipulated. Every effort has been made to obtain as much relevant data as possible for this evaluation. It is important to note that the conclusions that lead to this procedure are derived in large part from the available data. Always take into account that the treatment will also be dependent on availability of resources and existing treatment guidelines, considered by other Pain Management Practitioners as being common knowledge and practice, at the time of the intervention. For Medico-Legal purposes, it is also important to point out that variation in procedural techniques and pharmacological choices are the acceptable norm. The indications, contraindications, technique, and results of the above procedure should only be interpreted and judged by a Board-Certified Interventional Pain Specialist with extensive familiarity and expertise in the same exact procedure and technique.

## 2021-01-30 ENCOUNTER — Other Ambulatory Visit: Payer: Self-pay

## 2021-01-30 ENCOUNTER — Encounter: Payer: Self-pay | Admitting: Pain Medicine

## 2021-01-30 ENCOUNTER — Ambulatory Visit: Payer: Medicare Other | Attending: Pain Medicine | Admitting: Pain Medicine

## 2021-01-30 VITALS — BP 125/48 | HR 99 | Temp 97.1°F | Resp 16 | Ht 68.0 in | Wt 149.0 lb

## 2021-01-30 DIAGNOSIS — Z79899 Other long term (current) drug therapy: Secondary | ICD-10-CM | POA: Insufficient documentation

## 2021-01-30 DIAGNOSIS — C9110 Chronic lymphocytic leukemia of B-cell type not having achieved remission: Secondary | ICD-10-CM | POA: Diagnosis present

## 2021-01-30 DIAGNOSIS — Z978 Presence of other specified devices: Secondary | ICD-10-CM | POA: Diagnosis present

## 2021-01-30 DIAGNOSIS — C8300 Small cell B-cell lymphoma, unspecified site: Secondary | ICD-10-CM | POA: Diagnosis present

## 2021-01-30 DIAGNOSIS — Z95828 Presence of other vascular implants and grafts: Secondary | ICD-10-CM | POA: Insufficient documentation

## 2021-01-30 DIAGNOSIS — Z79891 Long term (current) use of opiate analgesic: Secondary | ICD-10-CM | POA: Diagnosis present

## 2021-01-30 DIAGNOSIS — G8929 Other chronic pain: Secondary | ICD-10-CM | POA: Insufficient documentation

## 2021-01-30 DIAGNOSIS — Z451 Encounter for adjustment and management of infusion pump: Secondary | ICD-10-CM | POA: Insufficient documentation

## 2021-01-30 DIAGNOSIS — M4854XS Collapsed vertebra, not elsewhere classified, thoracic region, sequela of fracture: Secondary | ICD-10-CM | POA: Diagnosis present

## 2021-01-30 DIAGNOSIS — G894 Chronic pain syndrome: Secondary | ICD-10-CM | POA: Insufficient documentation

## 2021-01-30 DIAGNOSIS — R1013 Epigastric pain: Secondary | ICD-10-CM | POA: Insufficient documentation

## 2021-01-30 DIAGNOSIS — R109 Unspecified abdominal pain: Secondary | ICD-10-CM | POA: Insufficient documentation

## 2021-01-30 DIAGNOSIS — Z5189 Encounter for other specified aftercare: Secondary | ICD-10-CM | POA: Insufficient documentation

## 2021-01-30 DIAGNOSIS — G893 Neoplasm related pain (acute) (chronic): Secondary | ICD-10-CM | POA: Insufficient documentation

## 2021-01-30 MED ORDER — PAIN MANAGEMENT IT PUMP REFILL: 1.0000 | Freq: Once | INTRATHECAL | 0 refills | Status: AC

## 2021-01-30 MED ORDER — OXYCODONE HCL 10 MG PO TABS: 10.0000 mg | ORAL_TABLET | ORAL | 0 refills | Status: AC | PRN

## 2021-01-30 MED ORDER — OXYCODONE HCL 10 MG PO TABS: 10.0000 mg | ORAL_TABLET | ORAL | 0 refills | Status: DC | PRN

## 2021-01-30 NOTE — Patient Instructions (Addendum)
Opioid Overdose Opioids are drugs that are often used to treat pain. Opioids include illegal drugs, such as heroin, as well as prescription pain medicines, such as codeine, morphine, hydrocodone, and fentanyl. An opioid overdose happens when you take too much of an opioid. An overdose may be intentional or accidental and can happen with any type of opioid. The effects of an overdose can be mild, dangerous, or even deadly. Opioid overdose is a medical emergency. What are the causes? This condition may be caused by: Taking too much of an opioid on purpose. Taking too much of an opioid by accident. Using two or more substances that contain opioids at the same time. Taking an opioid with a substance that affects your heart, breathing, or blood pressure. These include alcohol, tranquilizers, sleeping pills, illegal drugs, and some over-the-counter medicines. This condition may also happen due to an error made by: A health care provider who prescribes a medicine. The pharmacist who fills the prescription. What increases the risk? This condition is more likely in: Children. They may be attracted to colorful pills. Because of a child's small size, even a small amount of a medicine can be dangerous. Older people. They may be taking many different medicines. Older people may have difficulty reading labels or remembering when they last took their medicines. They may also be more sensitive to the effects of opioids. People with chronic medical conditions, especially heart, liver, kidney, or neurological diseases. People who take an opioid for a long period of time. People who take opioids and use illegal drugs, such as heroin, or other substances, such as alcohol. People who: Have a history of drug or alcohol abuse. Have certain mental health conditions. Have a history of previous drug overdoses. People who take opioids that are not prescribed for them. What are the signs or symptoms? Symptoms of this  condition depend on the type of opioid and the amount that was taken. Common symptoms include: Sleepiness or difficulty waking from sleep. Confusion. Slurred speech. Slowed breathing and a slow pulse (bradycardia). Nausea and vomiting. Abnormally small pupils. Signs and symptoms that require emergency treatment include: Cold, clammy, and pale skin. Blue lips and fingernails. Vomiting. Gurgling sounds in the throat. A pulse that is very slow or difficult to detect. Breathing that is very irregular, slow, noisy, or difficult to detect. Inability to respond to speech or be awakened from sleep (stupor). Seizures. How is this diagnosed? This condition is diagnosed based on your symptoms and medical history. It is important to tell your health care provider: About all of the opioids that you took. When you took the opioids. Whether you were drinking alcohol or using marijuana, cocaine, or other drugs. Your health care provider will do a physical exam. This exam may include: Checking and monitoring your heart rate and rhythm, breathing rate, temperature, and blood pressure. Measuring oxygen levels in your blood. Checking for abnormally small pupils. You may also have blood tests or urine tests. You may have X-rays if you are having severe breathing problems. How is this treated? This condition requires immediate medical treatment and hospitalization. Reversing the effects of the opioid is the first step in treatment. If you have a Narcan kit or naloxone, use it right away. Follow your health care provider's instructions. A friend or family member can also help you with this. The rest of your treatment will be given in the hospital intensive care (ICU). Treatment in the hospital may include: Giving salts and minerals (electrolytes) along with fluids through   an IV. Inserting a breathing tube (endotracheal tube) in your airway to help you breathe if you cannot breathe on your own or you are in  danger of not being able to breathe on your own. Giving oxygen through a small tube under your nose. Passing a tube through your nose and into your stomach (nasogastric tube, or NG tube) to empty your stomach. Giving medicines that: Increase your blood pressure. Relieve nausea and vomiting. Relieve abdominal pain and cramping. Reverse the effects of the opioid (naloxone). Monitoring your heart and oxygen levels. Ongoing counseling and mental health support if you intentionally overdosed or used an illegal drug. Follow these instructions at home: Medicines Take over-the-counter and prescription medicines only as told by your health care provider. Always ask your health care provider about possible side effects and interactions of any new medicine that you start taking. Keep a list of all the medicines that you take, including over-the-counter medicines. Bring this list with you to all your medical visits. General instructions Drink enough fluid to keep your urine pale yellow. Keep all follow-up visits. This is important. How is this prevented? Read the drug inserts that come with your opioid pain medicines. Take medicines only as told by your health care provider. Do not take more medicine than you are told. Do not take medicines more frequently than you are told. Do not drink alcohol or take sedatives when taking opioids. Do not use illegal or recreational drugs, including cocaine, ecstasy, and marijuana. Do not take opioid medicines that are not prescribed for you. Store all medicines in safety containers that are out of the reach of children. Get help if you are struggling with: Alcohol or drug use. Depression or another mental health problem. Thoughts of hurting yourself or another person. Keep the phone number of your local poison control center near your phone or in your mobile phone. In the [place] the hotline of the [facility] is 205 113 9322. If you were  prescribed naloxone, make sure you understand how to take it. Contact a health care provider if: You need help understanding how to take your pain medicines. You feel your medicines are too strong. You are concerned that your pain medicines are not working well for your pain. You develop new symptoms or side effects when you are taking medicines. Get help right away if: You or someone else is having symptoms of an opioid overdose. Get help even if you are not sure. You have thoughts about hurting yourself or others. You have: Chest pain. Difficulty breathing. A loss of consciousness. These symptoms may represent a serious problem that is an emergency. Do not wait to see if the symptoms will go away. Get medical help right away. Call your local emergency services (911 in the [place] Do not drive yourself to the hospital. If you ever feel like you may hurt yourself or others, or have thoughts about taking your own life, get help right away. You can go to your nearest emergency department or: Call your local emergency services (911 in the [place] Call a suicide crisis helpline, such as the [facility] at 309-291-6397. This is open 24 hours a day in the [place] Text the Crisis Text Line at 502-675-8022 (in the [place] Summary Opioids are drugs that are often used to treat pain. Opioids include illegal drugs, such as heroin, as well as prescription pain medicines. An opioid overdose happens when you take too much of an opioid. Overdoses can be intentional or  accidental. Opioid overdose is very dangerous. It is a life-threatening emergency. If you or someone you know is experiencing an opioid overdose, get help right away. This information is not intended to replace advice given to you by your health care provider. Make sure you discuss any questions you have with your health care provider. Document Revised: 07/26/2020 Document Reviewed: 07/26/2020 Elsevier Patient Education  [facility] [address] [place] [address] [phone]  February 28, 2021 @ 0900.  Please arrive 0830  Dr Truman Hayward   All oxycodone Rx's cancelled that were sent to [facility].  [place], [place]  [address]

## 2021-02-01 MED FILL — Medication: INTRATHECAL | Qty: 1 | Status: AC

## 2021-02-15 ENCOUNTER — Telehealth: Payer: Self-pay

## 2021-02-15 NOTE — Telephone Encounter (Signed)
Pt's son said he spoke with Cecille Rubin regarding a pain doctor for his father in [place]. He says that he has not received an email with the information yet.

## 2021-02-15 NOTE — Telephone Encounter (Signed)
Called [facility] to let them know that Mr Cordrey has not received any new patient forms via email.  I confirmed that they have [email or web address].  they are going to resend the forms to James [name], Mr Filler son.    I have also left a voicemail with James [name] letting them know what email they had on file.  Asked him to call back if this is incorrect or if they do not get the new patient forms.    The clinic is;  [facility] [address] [place]  [address] [phone]  Appt; Dr Truman Hayward February 28, 2021 @ 0900 to arrive 0830

## 2021-02-16 ENCOUNTER — Encounter (INDEPENDENT_AMBULATORY_CARE_PROVIDER_SITE_OTHER): Payer: Medicare Other

## 2021-02-16 ENCOUNTER — Ambulatory Visit (INDEPENDENT_AMBULATORY_CARE_PROVIDER_SITE_OTHER): Payer: Medicare Other | Admitting: Vascular Surgery

## 2021-02-26 NOTE — Progress Notes (Signed)
PROVIDER NOTE: Information contained herein reflects review and annotations entered in association with encounter. Interpretation of such information and data should be left to medically-trained personnel. Information provided to patient can be located elsewhere in the medical record under "Patient Instructions". Document created using STT-dictation technology, any transcriptional errors that may result from process are unintentional.    Patient: James [name]  Service Category: Procedure Provider: Gaspar Cola, MD DOB: May 06, 1950 DOS: 02/27/2021 Location: [facility] MRN: [id number] Setting: Ambulatory - outpatient Referring Provider: [name], MD Type: Established Patient Specialty: Interventional Pain Management PCP: [name], MD  Primary Reason for Visit: Interventional Pain Management Treatment. CC: Abdominal Pain  Procedure:          Type: Management of Intrathecal Drug Delivery System (IDDS) - Reservoir Refill [phone]). No rate change.  Indications: 1. Cancer-related pain   2. Chronic epigastric pain (1ry area of Pain)   3. Chronic abdominal pain   4. CLL (chronic lymphocytic leukemia) ([place])   5. Malignant lymphoma, small lymphocytic (HCC)   6. Abdominal wall pain in epigastric region   7. Chronic pain syndrome   8. Presence of intrathecal pump (Medtronic)   9. Pharmacologic therapy   10. Encounter for adjustment and management of infusion pump   11. Chronic use of opiate for therapeutic purpose   12. Encounter for medication management   13. Non-traumatic compression fracture of T2 thoracic vertebra, sequela   14. Non-traumatic compression fracture of T1 thoracic vertebra, sequela    Pain Assessment: Self-Reported Pain Score: 6 /10             Reported level is compatible with observation.        RTCB: 06/06/2021  Note: Although the patient was present in the office for the intrathecal pump refill, I conducted the refill by virtual  visit through my staff, secondary to the fact that I had recently tested positive for COVID and was in isolation.   IDDS (Intrathecal Drug Delivery System):   Device:  Manufacturer: Medtronic Synchromed II Model: [id number] Serial No.: [id number] Type: Programmable  Volume: 40 mL reservoir Catheter Placement: Intrathecal Catheter Level: T6-7 MRI compatibility: Yes   Implant Details:  Implant Date: 02/07/2020 Implanter: Deetta Perla, MD [facility]) Intrathecal Access: L3-4 Delivery Route: Intrathecal Site: Abdominal Laterality: Left  Content:  1ry Medication Class: Opioid 1ry Medication (Concentration): PF-Fentanyl (2000 mcg/mL)  2ry Medication (Concentration): PF-Bupivacaine (20 mg/mL)  3ry Medication (Concentration): None  PTM (PCA) mode:  PTM dose: 25 mcg bolus Duration of bolus: 1 minute Lockout interval: 4 hours Maximum 24-hour doses: 6  Programming:  Type: Simple continuous with PCA.  Medication, Concentration, Infusion Program, & Delivery Rate: For up-to-date details please see most recent scanned programming printout.     Changes:  Medication Change: None at this point Rate Change: No change in rate  Reported side-effects or adverse reactions: None reported  Effectiveness: Described as relatively effective, allowing for increase in activities of daily living (ADL) Clinically meaningful improvement in function (CMIF): Sustained CMIF goals met  Plan: Pump refill today   Pharmacotherapy Assessment   Analgesic: Oxycodone IR 83m, 1 tab PO q 4 hrs (60 mg/day of oxycodone IR) (90 MME) Highest recorded MME/day: 150 mg/day 90 day average MME/day: 632.27 mg/day, according to PMP   Monitoring: [place] PMP: PDMP not reviewed this encounter.       Pharmacotherapy: No side-effects or adverse reactions reported. Compliance: No problems identified. Effectiveness: Clinically acceptable. Plan: Refer to "POC". UDS:  Summary  Date Value Ref Range Status  12/26/2020  Note  Final    Comment:    ==================================================================== [employer] Select 13 (MW) ==================================================================== Test                             Result       Flag       Units  Drug Present and Declared for Prescription Verification   Oxycodone                      4722         EXPECTED   ng/mg creat   Oxymorphone                    3390         EXPECTED   ng/mg creat   Noroxycodone                   3610         EXPECTED   ng/mg creat   Noroxymorphone                 737          EXPECTED   ng/mg creat    Sources of oxycodone are scheduled prescription medications.    Oxymorphone, noroxycodone, and noroxymorphone are expected    metabolites of oxycodone. Oxymorphone is also available as a    scheduled prescription medication.    Fentanyl                       42           EXPECTED   ng/mg creat   Norfentanyl                    154          EXPECTED   ng/mg creat    Source of fentanyl is a scheduled prescription medication, including    IV, patch, and transmucosal formulations. Norfentanyl is an expected    metabolite of fentanyl.  Drug Present not Declared for Prescription Verification   Carboxy-THC                    >1020        UNEXPECTED ng/mg creat    Carboxy-THC is a metabolite of tetrahydrocannabinol (THC). Source of    THC is most commonly herbal marijuana or marijuana-based products,    but THC is also present in a scheduled prescription medication.    Trace amounts of THC can be present in hemp and cannabidiol (CBD)    products. This test is not intended to distinguish between delta-9-    tetrahydrocannabinol, the predominant form of THC in most herbal or    marijuana-based products, and delta-8-tetrahydrocannabinol.  ==================================================================== Test                      Result    Flag   Units      Ref Range   Creatinine              98                mg/dL      >=20 ==================================================================== Declared Medications:  The flagging and interpretation on this report are based on the  following declared medications.  Unexpected results may arise from  inaccuracies in the declared medications.   **  Note: The testing scope of this panel includes these medications:   Fentanyl  Oxycodone   **Note: The testing scope of this panel does not include the  following reported medications:   Bupivacaine  Clopidogrel (Plavix)  Folic Acid  Gabapentin (Neurontin)  Hydrocortisone (Cortef)  Metformin (Glucophage)  Multivitamin  Naloxegol (Movantik)  Naloxone (Narcan)  Pantoprazole (Protonix)  Supplement ==================================================================== For clinical consultation, please call [phone]. ====================================================================      Pre-op H&P Assessment:  Mr. Berberian is a 70 y.o. (year old), male patient, seen today for interventional treatment. He  has a past surgical history that includes Cholecystectomy (1983); Esophagogastroduodenoscopy (egd) with propofol (N/A, 11/20/2016); Cataract extraction w/ intraocular lens implant (Bilateral); Lower Extremity Angiography (Right, 05/19/2017); Esophagogastroduodenoscopy (egd) with propofol (N/A, 10/27/2017); Colonoscopy with propofol (N/A, 10/27/2017); Lower Extremity Angiography (Left, 08/10/2018); Intrathecal pump implant (Left, 02/07/2020); and Esophagogastroduodenoscopy (egd) with propofol (N/A, 11/23/2020). Mr. Northup has a current medication list which includes the following prescription(s): AMBULATORY NON FORMULARY MEDICATION, clopidogrel, ensure, folic acid, gabapentin, hydrocortisone, hydrocortisone, metformin, multivitamin with minerals, naloxegol oxalate, naloxone, [START ON 03/08/2021] oxycodone hcl, [START ON 04/07/2021] oxycodone hcl, pantoprazole, cephalexin, [START ON 05/07/2021] oxycodone hcl,  sulfamethoxazole-trimethoprim, and venclexta. His primarily concern today is the Abdominal Pain  Initial Vital Signs:  Pulse/HCG Rate: 86  Temp: (!) 97.2 F (36.2 C) Resp: 16 BP: 128/69 SpO2: 100 %  BMI: Estimated body mass index is 21.29 kg/m as calculated from the following:   Height as of this encounter: $RemoveBeforeD'5\' 8"'aMUkNKvWgKjzsH$  (1.727 m).   Weight as of this encounter: 140 lb (63.5 kg).  Risk Assessment: Allergies: Reviewed. He has No Known Allergies.  Allergy Precautions: None required Coagulopathies: Reviewed. None identified.  Blood-thinner therapy: None at this time Active Infection(s): Reviewed. None identified. Mr. Alcindor is afebrile  Site Confirmation: Mr. Gibbons was asked to confirm the procedure and laterality before marking the site Procedure checklist: Completed Consent: Before the procedure and under the influence of no sedative(s), amnesic(s), or anxiolytics, the patient was informed of the treatment options, risks and possible complications. To fulfill our ethical and legal obligations, as recommended by the American Medical Association's Code of Ethics, I have informed the patient of my clinical impression; the nature and purpose of the treatment or procedure; the risks, benefits, and possible complications of the intervention; the alternatives, including doing nothing; the risk(s) and benefit(s) of the alternative treatment(s) or procedure(s); and the risk(s) and benefit(s) of doing nothing.  Mr. Kuiken was provided with information about the general risks and possible complications associated with most interventional procedures. These include, but are not limited to: failure to achieve desired goals, infection, bleeding, organ or nerve damage, allergic reactions, paralysis, and/or death.  In addition, he was informed of those risks and possible complications associated to this particular procedure, which include, but are not limited to: damage to the implant; failure to decrease pain;  local, systemic, or serious CNS infections, intraspinal abscess with possible cord compression and paralysis, or life-threatening such as meningitis; bleeding; organ damage; nerve injury or damage with subsequent sensory, motor, and/or autonomic system dysfunction, resulting in transient or permanent pain, numbness, and/or weakness of one or several areas of the body; allergic reactions, either minor or major life-threatening, such as anaphylactic or anaphylactoid reactions.  Furthermore, Mr. Shipes was informed of those risks and complications associated with the medications. These include, but are not limited to: allergic reactions (i.e.: anaphylactic or anaphylactoid reactions); endorphine suppression; bradycardia and/or hypotension; water retention and/or peripheral vascular relaxation leading to lower  extremity edema and possible stasis ulcers; respiratory depression and/or shortness of breath; decreased metabolic rate leading to weight gain; swelling or edema; medication-induced neural toxicity; particulate matter embolism and blood vessel occlusion with resultant organ, and/or nervous system infarction; and/or intrathecal granuloma formation with possible spinal cord compression and permanent paralysis.  Before refilling the pump Mr. Statler was informed that some of the medications used in the devise may not be FDA approved for such use and therefore it constitutes an off-label use of the medications.  Finally, he was informed that Medicine is not an exact science; therefore, there is also the possibility of unforeseen or unpredictable risks and/or possible complications that may result in a catastrophic outcome. The patient indicated having understood very clearly. We have given the patient no guarantees and we have made no promises. Enough time was given to the patient to ask questions, all of which were answered to the patient's satisfaction. Mr. Sheppard has indicated that he wanted to continue with the  procedure. Attestation: I, the ordering provider, attest that I have discussed with the patient the benefits, risks, side-effects, alternatives, likelihood of achieving goals, and potential problems during recovery for the procedure that I have provided informed consent. Date  Time: 02/27/2021 12:51 PM  Pre-Procedure Preparation:  Monitoring: As per clinic protocol. Respiration, ETCO2, SpO2, BP, heart rate and rhythm monitor placed and checked for adequate function Safety Precautions: Patient was assessed for positional comfort and pressure points before starting the procedure. Time-out: I initiated and conducted the "Time-out" before starting the procedure, as per protocol. The patient was asked to participate by confirming the accuracy of the "Time Out" information. Verification of the correct person, site, and procedure were performed and confirmed by me, the nursing staff, and the patient. "Time-out" conducted as per Joint Commission's Universal Protocol (UP.01.01.01). Time: 1252  Description of Procedure:          Position: Supine Target Area: Central-port of intrathecal pump. Approach: Anterior, 90 degree angle approach. Area Prepped: Entire Area around the pump implant. DuraPrep (Iodine Povacrylex [0.7% available iodine] and Isopropyl Alcohol, 74% w/w) Safety Precautions: Aspiration looking for blood return was conducted prior to all injections. At no point did we inject any substances, as a needle was being advanced. No attempts were made at seeking any paresthesias. Safe injection practices and needle disposal techniques used. Medications properly checked for expiration dates. SDV (single dose vial) medications used. Description of the Procedure: Protocol guidelines were followed. Two nurses trained to do implant refills were present during the entire procedure. The refill medication was checked by both healthcare providers as well as the patient. The patient was included in the "Time-out"  to verify the medication. The patient was placed in position. The pump was identified. The area was prepped in the usual manner. The sterile template was positioned over the pump, making sure the side-port location matched that of the pump. Both, the pump and the template were held for stability. The needle provided in the Medtronic Kit was then introduced thru the center of the template and into the central port. The pump content was aspirated and discarded volume documented. The new medication was slowly infused into the pump, thru the filter, making sure to avoid overpressure of the device. The needle was then removed and the area cleansed, making sure to leave some of the prepping solution back to take advantage of its long term bactericidal properties. The pump was interrogated and programmed to reflect the correct medication, volume, and dosage. The  program was printed and taken to the physician for approval. Once checked and signed by the physician, a copy was provided to the patient and another scanned into the EMR.  Vitals:   02/27/21 1249  BP: 128/69  Pulse: 86  Resp: 16  Temp: (!) 97.2 F (36.2 C)  TempSrc: Temporal  SpO2: 100%  Weight: 140 lb (63.5 kg)  Height: $Remove'5\' 8"'GhdNGeG$  (1.727 m)    Start Time: 1304 hrs. End Time: 1313 hrs. Materials & Medications: Medtronic Refill Kit Medication(s): Please see chart orders for details.  Imaging Guidance:          Type of Imaging Technique: None used Indication(s): N/A Exposure Time: No patient exposure Contrast: None used. Fluoroscopic Guidance: N/A Ultrasound Guidance: N/A Interpretation: N/A  Antibiotic Prophylaxis:   Anti-infectives (From admission, onward)    None      Indication(s): None identified  Post-operative Assessment:  Post-procedure Vital Signs:  Pulse/HCG Rate: 86  Temp: (!) 97.2 F (36.2 C) Resp: 16 BP: 128/69 SpO2: 100 %  EBL: None  Complications: No immediate post-treatment complications observed by team,  or reported by patient.  Note: The patient tolerated the entire procedure well. A repeat set of vitals were taken after the procedure and the patient was kept under observation following institutional policy, for this type of procedure. Post-procedural neurological assessment was performed, showing return to baseline, prior to discharge. The patient was provided with post-procedure discharge instructions, including a section on how to identify potential problems. Should any problems arise concerning this procedure, the patient was given instructions to immediately contact us, at any time, without hesitation. In any case, we plan to contact the patient by telephone for a follow-up status report regarding this interventional procedure.  Comments:  No additional relevant information.  Plan of Care  Orders:  Orders Placed This Encounter  Procedures   PUMP REFILL    Maintain Protocol by having two(2) healthcare providers during procedure and programming.    Scheduling Instructions:     Please refill intrathecal pump today.    Order Specific Question:   Where will this procedure be performed?    Answer:   [facility]   PUMP REFILL    Whenever possible schedule on a procedure today.    Standing Status:   Future    Standing Expiration Date:   06/26/2021    Scheduling Instructions:     Please schedule intrathecal pump refill based on pump programming. Avoid schedule intervals of more than 120 days (4 months).    Order Specific Question:   Where will this procedure be performed?    Answer:   [facility]   Informed Consent Details: Physician/Practitioner Attestation; Transcribe to consent form and obtain patient signature    Transcribe to consent form and obtain patient signature.    Order Specific Question:   Physician/Practitioner attestation of informed consent for procedure/surgical case    Answer:   I, the physician/practitioner, attest that I have discussed with the patient the  benefits, risks, side effects, alternatives, likelihood of achieving goals and potential problems during recovery for the procedure that I have provided informed consent.    Order Specific Question:   Procedure    Answer:   Intrathecal pump refill    Order Specific Question:   Physician/Practitioner performing the procedure    Answer:   Attending Physician: Kathlen Brunswick. Dossie Arbour, MD & designated trained staff    Order Specific Question:   Indication/Reason    Answer:   Chronic  Pain Syndrome (G89.4), presence of an intrathecal pump (Z97.8)    Chronic Opioid Analgesic:  Oxycodone IR 26m, 1 tab PO q 4 hrs (60 mg/day of oxycodone IR) (90 MME) Highest recorded MME/day: 150 mg/day 90 day average MME/day: 632.27 mg/day, according to PMP   Medications ordered for procedure: Meds ordered this encounter  Medications   Oxycodone HCl 10 MG TABS    Sig: Take 1 tablet (10 mg total) by mouth every 4 (four) hours as needed. Must last 30 days    Dispense:  180 tablet    Refill:  0    DO NOT: delete (not duplicate); no partial-fill (will deny script to complete), no refill request (F/U required). DISPENSE: 1 day early if closed on fill date. WARN: No CNS-depressants within 8 hrs of med.    Medications administered: Timtohy F. Heffington had no medications administered during this visit.  See the medical record for exact dosing, route, and time of administration.  Follow-up plan:   Return for Pump Refill (Max:374mo     Interventional Therapies  Risk  Complexity Considerations:   NOTE: PLAVIX ANTICOAGULATION  (Stop: 7 days  Restart: 2 hrs)   Planned  Pending:   Decrease pump PCA activations from 6/day to 3/day. Therapeutic bilateral splanchnic nerves neurolysis #1 with alcohol  Diagnostic intrathecal catheter test under fluoroscopic guidance to determine location    Under consideration:   Possible revision of intrathecal catheter    Completed:   Diagnostic bilateral celiac plexus block x2  (06/01/2019; 11/02/2019) (50/30/30/0) (75/75/0/0)  Therapeutic bilateral celiac plexus neurolysis with phenol x1 (04/06/2020)  Diagnostic bilateral rectus abdominis trigger point injection x1 (04/06/2020) (0/0/0)  Diagnostic (ML) T12-L1 intrathecal injection of PF-fentanyl 25 mcg as an intrathecal pump trial x1 (12/14/2019)   Therapeutic  Palliative (PRN) options:   Intrathecal pump management       Recent Visits Date Type Provider Dept  01/30/21 Procedure visit NaMilinda PointerMD [facility]  12/26/20 Procedure visit NaMilinda PointerMD [facility]  Showing recent visits within past 90 days and meeting all other requirements Today's Visits Date Type Provider Dept  02/27/21 Procedure visit NaMilinda PointerMD [facility]  Showing today's visits and meeting all other requirements Future Appointments No visits were found meeting these conditions. Showing future appointments within next 90 days and meeting all other requirements Disposition: Discharge home  Discharge (Date  Time): 02/27/2021; 1335 hrs.   Primary Care Physician: HaTracie HarrierMD [facility] Note by: FrGaspar ColaMD Date: 02/27/2021; Time: 2:31 PM  Disclaimer:  Medicine is not an exChief Strategy OfficerThe only guarantee in medicine is that nothing is guaranteed. It is important to note that the decision to proceed with this intervention was based on the information collected from the patient. The Data and conclusions were drawn from the patient's questionnaire, the interview, and the physical examination. Because the information was provided in large part by the patient, it cannot be guaranteed that it has not been purposely or unconsciously manipulated. Every effort has been made to obtain as much relevant data as possible for this evaluation. It is important to note that the conclusions that lead to this procedure are derived in large part from  the available data. Always take into account that the treatment will also be dependent on availability of resources and existing treatment guidelines, considered by other Pain Management Practitioners as being common knowledge and practice, at the time of the intervention. For Medico-Legal purposes, it  is also important to point out that variation in procedural techniques and pharmacological choices are the acceptable norm. The indications, contraindications, technique, and results of the above procedure should only be interpreted and judged by a Board-Certified Interventional Pain Specialist with extensive familiarity and expertise in the same exact procedure and technique.

## 2021-02-27 ENCOUNTER — Ambulatory Visit: Payer: Medicare Other | Attending: Pain Medicine | Admitting: Pain Medicine

## 2021-02-27 ENCOUNTER — Other Ambulatory Visit: Payer: Self-pay

## 2021-02-27 ENCOUNTER — Encounter: Payer: Self-pay | Admitting: Pain Medicine

## 2021-02-27 VITALS — BP 128/69 | HR 86 | Temp 97.2°F | Resp 16 | Ht 68.0 in | Wt 140.0 lb

## 2021-02-27 DIAGNOSIS — Z79891 Long term (current) use of opiate analgesic: Secondary | ICD-10-CM

## 2021-02-27 DIAGNOSIS — Z978 Presence of other specified devices: Secondary | ICD-10-CM

## 2021-02-27 DIAGNOSIS — Z451 Encounter for adjustment and management of infusion pump: Secondary | ICD-10-CM

## 2021-02-27 DIAGNOSIS — Z79899 Other long term (current) drug therapy: Secondary | ICD-10-CM | POA: Diagnosis not present

## 2021-02-27 DIAGNOSIS — G894 Chronic pain syndrome: Secondary | ICD-10-CM

## 2021-02-27 DIAGNOSIS — C9110 Chronic lymphocytic leukemia of B-cell type not having achieved remission: Secondary | ICD-10-CM

## 2021-02-27 DIAGNOSIS — R1013 Epigastric pain: Secondary | ICD-10-CM

## 2021-02-27 DIAGNOSIS — M4854XS Collapsed vertebra, not elsewhere classified, thoracic region, sequela of fracture: Secondary | ICD-10-CM

## 2021-02-27 DIAGNOSIS — C8300 Small cell B-cell lymphoma, unspecified site: Secondary | ICD-10-CM | POA: Diagnosis not present

## 2021-02-27 DIAGNOSIS — G8929 Other chronic pain: Secondary | ICD-10-CM

## 2021-02-27 DIAGNOSIS — G893 Neoplasm related pain (acute) (chronic): Secondary | ICD-10-CM | POA: Diagnosis present

## 2021-02-27 MED ORDER — OXYCODONE HCL 10 MG PO TABS: 10.0000 mg | ORAL_TABLET | ORAL | 0 refills | Status: AC | PRN

## 2021-02-27 NOTE — Progress Notes (Signed)
Nursing Pain Medication Assessment:  Safety precautions to be maintained throughout the outpatient stay will include: orient to surroundings, keep bed in low position, maintain call bell within reach at all times, provide assistance with transfer out of bed and ambulation.  Medication Inspection Compliance: Pill count conducted under aseptic conditions, in front of the patient. Neither the pills nor the bottle was removed from the patient's sight at any time. Once count was completed pills were immediately returned to the patient in their original bottle.  Medication: Oxycodone IR Pill/Patch Count:  29 of 180 pills remain Pill/Patch Appearance: Markings consistent with prescribed medication Bottle Appearance: Standard pharmacy container. Clearly labeled. Filled Date: 65 / 11 / 2022 Last Medication intake:  Today

## 2021-02-27 NOTE — Patient Instructions (Addendum)
____________________________________________________________________________________________  Medication Rules  Purpose: To inform patients, and their family members, of our rules and regulations.  Applies to: All patients receiving prescriptions (written or electronic).  Pharmacy of record: Pharmacy where electronic prescriptions will be sent. If written prescriptions are taken to a different pharmacy, please inform the nursing staff. The pharmacy listed in the electronic medical record should be the one where you would like electronic prescriptions to be sent.  Electronic prescriptions: In compliance with the [place] Strengthen Opioid Misuse Prevention (STOP) Act of 2017 (Session Law 2017-74/H243), effective April 29, 2018, all controlled substances must be electronically prescribed. Calling prescriptions to the pharmacy will cease to exist.  Prescription refills: Only during scheduled appointments. Applies to all prescriptions.  NOTE: The following applies primarily to controlled substances (Opioid* Pain Medications).   Type of encounter (visit): For patients receiving controlled substances, face-to-face visits are required. (Not an option or up to the patient.)  Patient's responsibilities: Pain Pills: Bring all pain pills to every appointment (except for procedure appointments). Pill Bottles: Bring pills in original pharmacy bottle. Always bring the newest bottle. Bring bottle, even if empty. Medication refills: You are responsible for knowing and keeping track of what medications you take and those you need refilled. The day before your appointment: write a list of all prescriptions that need to be refilled. The day of the appointment: give the list to the admitting nurse. Prescriptions will be written only during appointments. No prescriptions will be written on procedure days. If you forget a medication: it will not be "Called in", "Faxed", or "electronically sent". You will  need to get another appointment to get these prescribed. No early refills. Do not call asking to have your prescription filled early. Prescription Accuracy: You are responsible for carefully inspecting your prescriptions before leaving our office. Have the discharge nurse carefully go over each prescription with you, before taking them home. Make sure that your name is accurately spelled, that your address is correct. Check the name and dose of your medication to make sure it is accurate. Check the number of pills, and the written instructions to make sure they are clear and accurate. Make sure that you are given enough medication to last until your next medication refill appointment. Taking Medication: Take medication as prescribed. When it comes to controlled substances, taking less pills or less frequently than prescribed is permitted and encouraged. Never take more pills than instructed. Never take medication more frequently than prescribed.  Inform other Doctors: Always inform, all of your healthcare providers, of all the medications you take. Pain Medication from other Providers: You are not allowed to accept any additional pain medication from any other Doctor or Healthcare provider. There are two exceptions to this rule. (see below) In the event that you require additional pain medication, you are responsible for notifying us, as stated below. Cough Medicine: Often these contain an opioid, such as codeine or hydrocodone. Never accept or take cough medicine containing these opioids if you are already taking an opioid* medication. The combination may cause respiratory failure and death. Medication Agreement: You are responsible for carefully reading and following our Medication Agreement. This must be signed before receiving any prescriptions from our practice. Safely store a copy of your signed Agreement. Violations to the Agreement will result in no further prescriptions. (Additional copies of our  Medication Agreement are available upon request.) Laws, Rules, & Regulations: All patients are expected to follow all Federal and State Laws, Statutes, Rules, & Regulations. Ignorance of   the Laws does not constitute a valid excuse.  Illegal drugs and Controlled Substances: The use of illegal substances (including, but not limited to marijuana and its derivatives) and/or the illegal use of any controlled substances is strictly prohibited. Violation of this rule may result in the immediate and permanent discontinuation of any and all prescriptions being written by our practice. The use of any illegal substances is prohibited. Adopted CDC guidelines & recommendations: Target dosing levels will be at or below 60 MME/day. Use of benzodiazepines** is not recommended.  Exceptions: There are only two exceptions to the rule of not receiving pain medications from other Healthcare Providers. Exception #1 (Emergencies): In the event of an emergency (i.e.: accident requiring emergency care), you are allowed to receive additional pain medication. However, you are responsible for: As soon as you are able, call our office [phone], at any time of the day or night, and leave a message stating your name, the date and nature of the emergency, and the name and dose of the medication prescribed. In the event that your call is answered by a member of our staff, make sure to document and save the date, time, and the name of the person that took your information.  Exception #2 (Planned Surgery): In the event that you are scheduled by another doctor or dentist to have any type of surgery or procedure, you are allowed (for a period no longer than 30 days), to receive additional pain medication, for the acute post-op pain. However, in this case, you are responsible for picking up a copy of our "Post-op Pain Management for Surgeons" handout, and giving it to your surgeon or dentist. This document is available at our office, and  does not require an appointment to obtain it. Simply go to our office during business hours (Monday-Thursday from 8:00 AM to 4:00 PM) (Friday 8:00 AM to 12:00 Noon) or if you have a scheduled appointment with us, prior to your surgery, and ask for it by name. In addition, you are responsible for: calling our office [phone], at any time of the day or night, and leaving a message stating your name, name of your surgeon, type of surgery, and date of procedure or surgery. Failure to comply with your responsibilities may result in termination of therapy involving the controlled substances. Medication Agreement Violation. Following the above rules, including your responsibilities will help you in avoiding a Medication Agreement Violation ("Breaking your Pain Medication Contract").  *Opioid medications include: morphine, codeine, oxycodone, oxymorphone, hydrocodone, hydromorphone, meperidine, tramadol, tapentadol, buprenorphine, fentanyl, methadone. **Benzodiazepine medications include: diazepam (Valium), alprazolam (Xanax), clonazepam (Klonopine), lorazepam (Ativan), clorazepate (Tranxene), chlordiazepoxide (Librium), estazolam (Prosom), oxazepam (Serax), temazepam (Restoril), triazolam (Halcion) (Last updated: 01/24/2021) ____________________________________________________________________________________________  ____________________________________________________________________________________________  Medication Recommendations and Reminders  Applies to: All patients receiving prescriptions (written and/or electronic).  Medication Rules & Regulations: These rules and regulations exist for your safety and that of others. They are not flexible and neither are we. Dismissing or ignoring them will be considered "non-compliance" with medication therapy, resulting in complete and irreversible termination of such therapy. (See document titled "Medication Rules" for more details.) In all conscience,  because of safety reasons, we cannot continue providing a therapy where the patient does not follow instructions.  Pharmacy of record:  Definition: This is the pharmacy where your electronic prescriptions will be sent.  We do not endorse any particular pharmacy, however, we have experienced problems with [facility] not securing enough medication supply for the community. We do not restrict you   in your choice of pharmacy. However, once we write for your prescriptions, we will NOT be re-sending more prescriptions to fix restricted supply problems created by your pharmacy, or your insurance.  The pharmacy listed in the electronic medical record should be the one where you want electronic prescriptions to be sent. If you choose to change pharmacy, simply notify our nursing staff.  Recommendations: Keep all of your pain medications in a safe place, under lock and key, even if you live alone. We will NOT replace lost, stolen, or damaged medication. After you fill your prescription, take 1 week's worth of pills and put them away in a safe place. You should keep a separate, properly labeled bottle for this purpose. The remainder should be kept in the original bottle. Use this as your primary supply, until it runs out. Once it's gone, then you know that you have 1 week's worth of medicine, and it is time to come in for a prescription refill. If you do this correctly, it is unlikely that you will ever run out of medicine. To make sure that the above recommendation works, it is very important that you make sure your medication refill appointments are scheduled at least 1 week before you run out of medicine. To do this in an effective manner, make sure that you do not leave the office without scheduling your next medication management appointment. Always ask the nursing staff to show you in your prescription , when your medication will be running out. Then arrange for the receptionist to get you a return appointment,  at least 7 days before you run out of medicine. Do not wait until you have 1 or 2 pills left, to come in. This is very poor planning and does not take into consideration that we may need to cancel appointments due to bad weather, sickness, or emergencies affecting our staff. DO NOT ACCEPT A "Partial Fill": If for any reason your pharmacy does not have enough pills/tablets to completely fill or refill your prescription, do not allow for a "partial fill". The law allows the pharmacy to complete that prescription within 72 hours, without requiring a new prescription. If they do not fill the rest of your prescription within those 72 hours, you will need a separate prescription to fill the remaining amount, which we will NOT provide. If the reason for the partial fill is your insurance, you will need to talk to the pharmacist about payment alternatives for the remaining tablets, but again, DO NOT ACCEPT A PARTIAL FILL, unless you can trust your pharmacist to obtain the remainder of the pills within 72 hours.  Prescription refills and/or changes in medication(s):  Prescription refills, and/or changes in dose or medication, will be conducted only during scheduled medication management appointments. (Applies to both, written and electronic prescriptions.) No refills on procedure days. No medication will be changed or started on procedure days. No changes, adjustments, and/or refills will be conducted on a procedure day. Doing so will interfere with the diagnostic portion of the procedure. No phone refills. No medications will be "called into the pharmacy". No Fax refills. No weekend refills. No Holliday refills. No after hours refills.  Remember:  Business hours are:  Monday to Thursday 8:00 AM to 4:00 PM Provider's Schedule: Milinda Pointer, MD - Appointments are:  Medication management: Monday and Wednesday 8:00 AM to 4:00 PM Procedure day: Tuesday and Thursday 7:30 AM to 4:00 PM Gillis Santa, MD -  Appointments are:  Medication management: Tuesday and Thursday 8:00  AM to 4:00 PM Procedure day: Monday and Wednesday 7:30 AM to 4:00 PM (Last update: 11/17/2019) ____________________________________________________________________________________________  ____________________________________________________________________________________________  CBD (cannabidiol) & Delta-8 (Delta-8 tetrahydrocannabinol) WARNING  Intro: Cannabidiol (CBD) and tetrahydrocannabinol (THC), are two natural compounds found in plants of the Cannabis genus. They can both be extracted from hemp or cannabis. Hemp and cannabis come from the Cannabis sativa plant. Both compounds interact with your body's endocannabinoid system, but they have very different effects. CBD does not produce the high sensation associated with cannabis. Delta-8 tetrahydrocannabinol, also known as delta-8 THC, is a psychoactive substance found in the Cannabis sativa plant, of which marijuana and hemp are two varieties. THC is responsible for the high associated with the illicit use of marijuana.  Applicable to: All individuals currently taking or considering taking CBD (cannabidiol) and, more important, all patients taking opioid analgesic controlled substances (pain medication). (Example: oxycodone; oxymorphone; hydrocodone; hydromorphone; morphine; methadone; tramadol; tapentadol; fentanyl; buprenorphine; butorphanol; dextromethorphan; meperidine; codeine; etc.)  Legal status: CBD remains a Schedule I drug prohibited for any use. CBD is illegal with one exception. In the [place], CBD has a limited Food and Drug Administration (FDA) approval for the treatment of two specific types of epilepsy disorders. Only one CBD product has been approved by the FDA for this purpose: "Epidiolex". FDA is aware that some companies are marketing products containing cannabis and cannabis-derived compounds in ways that violate the Federal Food, Drug and Cosmetic Act  (FD&C Act) and that may put the health and safety of consumers at risk. The FDA, a Federal agency, has not enforced the CBD status since 2018.   Legality: Some manufacturers ship CBD products nationally, which is illegal. Often such products are sold online and are therefore available throughout the country. CBD is openly sold in head shops and health food stores in some states where such sales have not been explicitly legalized. Selling unapproved products with unsubstantiated therapeutic claims is not only a violation of the law, but also can put patients at risk, as these products have not been proven to be safe or effective. Federal illegality makes it difficult to conduct research on CBD.  Reference: "FDA Regulation of Cannabis and Cannabis-Derived Products, Including Cannabidiol (CBD)" - https://www.fda.gov/news-events/public-health-focus/fda-regulation-cannabis-and-cannabis-derived-products-including-cannabidiol-cbd  Warning: CBD is not FDA approved and has not undergo the same manufacturing controls as prescription drugs.  This means that the purity and safety of available CBD may be questionable. Most of the time, despite manufacturer's claims, it is contaminated with THC (delta-9-tetrahydrocannabinol - the chemical in marijuana responsible for the "HIGH").  When this is the case, the THC contaminant will trigger a positive urine drug screen (UDS) test for Marijuana (carboxy-THC). Because a positive UDS for any illicit substance is a violation of our medication agreement, your opioid analgesics (pain medicine) may be permanently discontinued. The FDA recently put out a warning about 5 things that everyone should be aware of regarding Delta-8 THC: Delta-8 THC products have not been evaluated or approved by the FDA for safe use and may be marketed in ways that put the public health at risk. The FDA has received adverse event reports involving delta-8 THC-containing products. Delta-8 THC has  psychoactive and intoxicating effects. Delta-8 THC manufacturing often involve use of potentially harmful chemicals to create the concentrations of delta-8 THC claimed in the marketplace. The final delta-8 THC product may have potentially harmful by-products (contaminants) due to the chemicals used in the process. Manufacturing of delta-8 THC products may occur in uncontrolled or unsanitary settings, which may   lead to the presence of unsafe contaminants or other potentially harmful substances. Delta-8 THC products should be kept out of the reach of children and pets.  MORE ABOUT CBD  General Information: CBD was discovered in 1940 and it is a derivative of the cannabis sativa genus plants (Marijuana and Hemp). It is one of the 113 identified substances found in Marijuana. It accounts for up to 40% of the plant's extract. As of 2018, preliminary clinical studies on CBD included research for the treatment of anxiety, movement disorders, and pain. CBD is available and consumed in multiple forms, including inhalation of smoke or vapor, as an aerosol spray, and by mouth. It may be supplied as an oil containing CBD, capsules, dried cannabis, or as a liquid solution. CBD is thought not to be as psychoactive as THC (delta-9-tetrahydrocannabinol - the chemical in marijuana responsible for the "HIGH"). Studies suggest that CBD may interact with different biological target receptors in the body, including cannabinoid and other neurotransmitter receptors. As of 2018 the mechanism of action for its biological effects has not been determined.  Side-effects  Adverse reactions: Dry mouth, diarrhea, decreased appetite, fatigue, drowsiness, malaise, weakness, sleep disturbances, and others.  Drug interactions: CBC may interact with other medications such as blood-thinners. (Last update: 01/26/2021) ____________________________________________________________________________________________   ____________________________________________________________________________________________  Drug Holidays (Slow)  What is a "Drug Holiday"? Drug Holiday: is the name given to the period of time during which a patient stops taking a medication(s) for the purpose of eliminating tolerance to the drug.  Benefits Improved effectiveness of opioids. Decreased opioid dose needed to achieve benefits. Improved pain with lesser dose.  What is tolerance? Tolerance: is the progressive decreased in effectiveness of a drug due to its repetitive use. With repetitive use, the body gets use to the medication and as a consequence, it loses its effectiveness. This is a common problem seen with opioid pain medications. As a result, a larger dose of the drug is needed to achieve the same effect that used to be obtained with a smaller dose.  How long should a "Drug Holiday" last? You should stay off of the pain medicine for at least 14 consecutive days. (2 weeks)  Should I stop the medicine "cold turkey"? No. You should always coordinate with your Pain Specialist so that he/she can provide you with the correct medication dose to make the transition as smoothly as possible.  How do I stop the medicine? Slowly. You will be instructed to decrease the daily amount of pills that you take by one (1) pill every seven (7) days. This is called a "slow downward taper" of your dose. For example: if you normally take four (4) pills per day, you will be asked to drop this dose to three (3) pills per day for seven (7) days, then to two (2) pills per day for seven (7) days, then to one (1) per day for seven (7) days, and at the end of those last seven (7) days, this is when the "Drug Holiday" would start.   Will I have withdrawals? By doing a "slow downward taper" like this one, it is unlikely that you will experience any significant withdrawal symptoms. Typically, what triggers withdrawals is the sudden stop of a high dose  opioid therapy. Withdrawals can usually be avoided by slowly decreasing the dose over a prolonged period of time. If you do not follow these instructions and decide to stop your medication abruptly, withdrawals may be possible.  What are withdrawals? Withdrawals: refers to   the wide range of symptoms that occur after stopping or dramatically reducing opiate drugs after heavy and prolonged use. Withdrawal symptoms do not occur to patients that use low dose opioids, or those who take the medication sporadically. Contrary to benzodiazepine (example: Valium, Xanax, etc.) or alcohol withdrawals ("Delirium Tremens"), opioid withdrawals are not lethal. Withdrawals are the physical manifestation of the body getting rid of the excess receptors.  Expected Symptoms Early symptoms of withdrawal may include: Agitation Anxiety Muscle aches Increased tearing Insomnia Runny nose Sweating Yawning  Late symptoms of withdrawal may include: Abdominal cramping Diarrhea Dilated pupils Goose bumps Nausea Vomiting  Will I experience withdrawals? Due to the slow nature of the taper, it is very unlikely that you will experience any.  What is a slow taper? Taper: refers to the gradual decrease in dose.  (Last update: 11/17/2019) ____________________________________________________________________________________________   Opioid Overdose Opioids are drugs that are often used to treat pain. Opioids include illegal drugs, such as heroin, as well as prescription pain medicines, such as codeine, morphine, hydrocodone, and fentanyl. An opioid overdose happens when you take too much of an opioid. An overdose may be intentional or accidental and can happen with any type of opioid. The effects of an overdose can be mild, dangerous, or even deadly. Opioid overdose is a medical emergency. What are the causes? This condition may be caused by: Taking too much of an opioid on purpose. Taking too much of an opioid by  accident. Using two or more substances that contain opioids at the same time. Taking an opioid with a substance that affects your heart, breathing, or blood pressure. These include alcohol, tranquilizers, sleeping pills, illegal drugs, and some over-the-counter medicines. This condition may also happen due to an error made by: A health care provider who prescribes a medicine. The pharmacist who fills the prescription. What increases the risk? This condition is more likely in: Children. They may be attracted to colorful pills. Because of a child's small size, even a small amount of a medicine can be dangerous. Older people. They may be taking many different medicines. Older people may have difficulty reading labels or remembering when they last took their medicines. They may also be more sensitive to the effects of opioids. People with chronic medical conditions, especially heart, liver, kidney, or neurological diseases. People who take an opioid for a long period of time. People who take opioids and use illegal drugs, such as heroin, or other substances, such as alcohol. People who: Have a history of drug or alcohol abuse. Have certain mental health conditions. Have a history of previous drug overdoses. People who take opioids that are not prescribed for them. What are the signs or symptoms? Symptoms of this condition depend on the type of opioid and the amount that was taken. Common symptoms include: Sleepiness or difficulty waking from sleep. Confusion. Slurred speech. Slowed breathing and a slow pulse (bradycardia). Nausea and vomiting. Abnormally small pupils. Signs and symptoms that require emergency treatment include: Cold, clammy, and pale skin. Blue lips and fingernails. Vomiting. Gurgling sounds in the throat. A pulse that is very slow or difficult to detect. Breathing that is very irregular, slow, noisy, or difficult to detect. Inability to respond to speech or be awakened  from sleep (stupor). Seizures. How is this diagnosed? This condition is diagnosed based on your symptoms and medical history. It is important to tell your health care provider: About all of the opioids that you took. When you took the opioids. Whether you were   drinking alcohol or using marijuana, cocaine, or other drugs. Your health care provider will do a physical exam. This exam may include: Checking and monitoring your heart rate and rhythm, breathing rate, temperature, and blood pressure. Measuring oxygen levels in your blood. Checking for abnormally small pupils. You may also have blood tests or urine tests. You may have X-rays if you are having severe breathing problems. How is this treated? This condition requires immediate medical treatment and hospitalization. Reversing the effects of the opioid is the first step in treatment. If you have a Narcan kit or naloxone, use it right away. Follow your health care provider's instructions. A friend or family member can also help you with this. The rest of your treatment will be given in the hospital intensive care (ICU). Treatment in the hospital may include: Giving salts and minerals (electrolytes) along with fluids through an IV. Inserting a breathing tube (endotracheal tube) in your airway to help you breathe if you cannot breathe on your own or you are in danger of not being able to breathe on your own. Giving oxygen through a small tube under your nose. Passing a tube through your nose and into your stomach (nasogastric tube, or NG tube) to empty your stomach. Giving medicines that: Increase your blood pressure. Relieve nausea and vomiting. Relieve abdominal pain and cramping. Reverse the effects of the opioid (naloxone). Monitoring your heart and oxygen levels. Ongoing counseling and mental health support if you intentionally overdosed or used an illegal drug. Follow these instructions at home: Medicines Take over-the-counter and  prescription medicines only as told by your health care provider. Always ask your health care provider about possible side effects and interactions of any new medicine that you start taking. Keep a list of all the medicines that you take, including over-the-counter medicines. Bring this list with you to all your medical visits. General instructions Drink enough fluid to keep your urine pale yellow. Keep all follow-up visits. This is important. How is this prevented? Read the drug inserts that come with your opioid pain medicines. Take medicines only as told by your health care provider. Do not take more medicine than you are told. Do not take medicines more frequently than you are told. Do not drink alcohol or take sedatives when taking opioids. Do not use illegal or recreational drugs, including cocaine, ecstasy, and marijuana. Do not take opioid medicines that are not prescribed for you. Store all medicines in safety containers that are out of the reach of children. Get help if you are struggling with: Alcohol or drug use. Depression or another mental health problem. Thoughts of hurting yourself or another person. Keep the phone number of your local poison control center near your phone or in your mobile phone. In the [place] the hotline of the [facility] is 670-684-7376. If you were prescribed naloxone, make sure you understand how to take it. Contact a health care provider if: You need help understanding how to take your pain medicines. You feel your medicines are too strong. You are concerned that your pain medicines are not working well for your pain. You develop new symptoms or side effects when you are taking medicines. Get help right away if: You or someone else is having symptoms of an opioid overdose. Get help even if you are not sure. You have thoughts about hurting yourself or others. You have: Chest pain. Difficulty breathing. A loss of  consciousness. These symptoms may represent a serious problem that is an  emergency. Do not wait to see if the symptoms will go away. Get medical help right away. Call your local emergency services (911 in the [place] Do not drive yourself to the hospital. If you ever feel like you may hurt yourself or others, or have thoughts about taking your own life, get help right away. You can go to your nearest emergency department or: Call your local emergency services (911 in the [place] Call a suicide crisis helpline, such as the [place] at 308-816-1316. This is open 24 hours a day in the [place] Text the Crisis Text Line at (763)803-9005 (in the [employer] Summary Opioids are drugs that are often used to treat pain. Opioids include illegal drugs, such as heroin, as well as prescription pain medicines. An opioid overdose happens when you take too much of an opioid. Overdoses can be intentional or accidental. Opioid overdose is very dangerous. It is a life-threatening emergency. If you or someone you know is experiencing an opioid overdose, get help right away. This information is not intended to replace advice given to you by your health care provider. Make sure you discuss any questions you have with your health care provider. Document Revised: 07/26/2020 Document Reviewed: 07/26/2020 Elsevier Patient Education  [employer].

## 2021-02-28 ENCOUNTER — Telehealth: Payer: Self-pay | Admitting: *Deleted

## 2021-02-28 NOTE — Telephone Encounter (Signed)
No problems post IT pump refill. 

## 2021-03-03 MED FILL — Medication: INTRATHECAL | Qty: 1 | Status: AC

## 2021-08-21 IMAGING — CT NM PET TUM IMG RESTAG (PS) SKULL BASE T - THIGH
10 series · 24 of 25 positions shown · non-contrast
Comparison: CT 01/24/2020

CLINICAL DATA: Subsequent treatment strategy for chronic
lymphocytic leukemia.

EXAM:
NUCLEAR MEDICINE PET SKULL BASE TO THIGH
TECHNIQUE: 7.8 mCi F-18 FDG was injected intravenously. Full-ring PET imaging
was performed from the skull base to thigh after the radiotracer. CT
data was obtained and used for attenuation correction and anatomic
localization.
Fasting blood glucose: 76 mg/dl

[Series 3: ct wb 5.0 b30f · axial · 5.0mm · 0.98mm/px · z∈[-202,+665]mm · 3 of 290 slices shown]
[im 1/290]
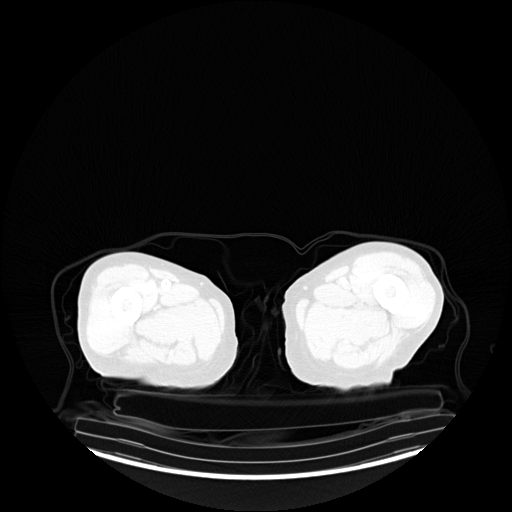
[im 145/290]
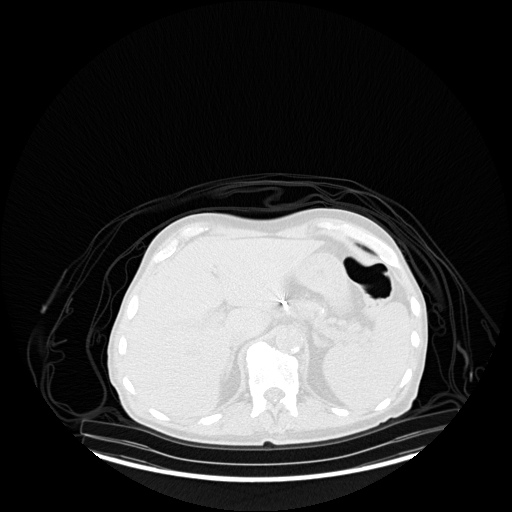
[im 290/290  brain]
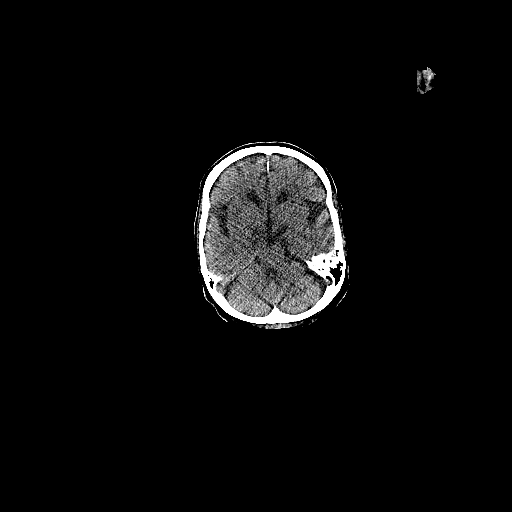

[Series 5: pet wb uncorrected (nac) · axial · 5.0mm · 4.07mm/px · z∈[-202,+665]mm · 3 of 290 slices shown]
[im 1/290]
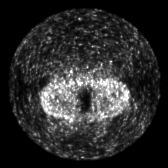
[im 145/290]
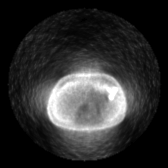
[im 290/290]
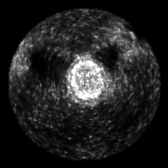

[Series 6: pet wb (ac) · axial · 5.0mm · 2.72mm/px · z∈[-202,+665]mm · 3 of 290 slices shown]
[im 1/290]
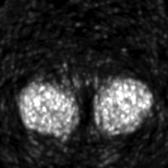
[im 145/290]
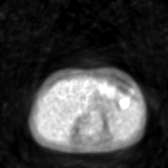
[im 290/290]
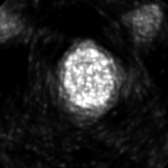

[Series 603: fused axial · 3 of 288 slices shown]
[im 1/288]
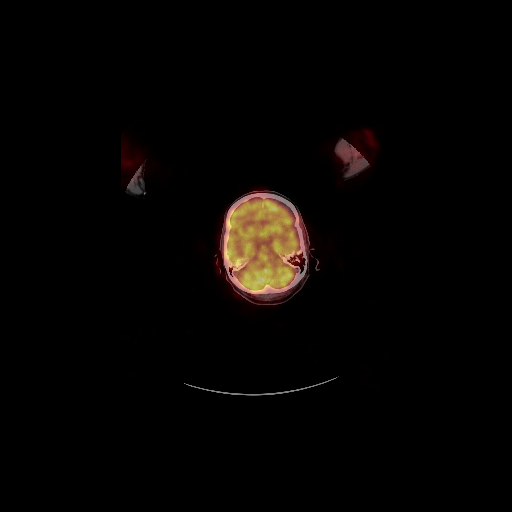
[im 96/288]
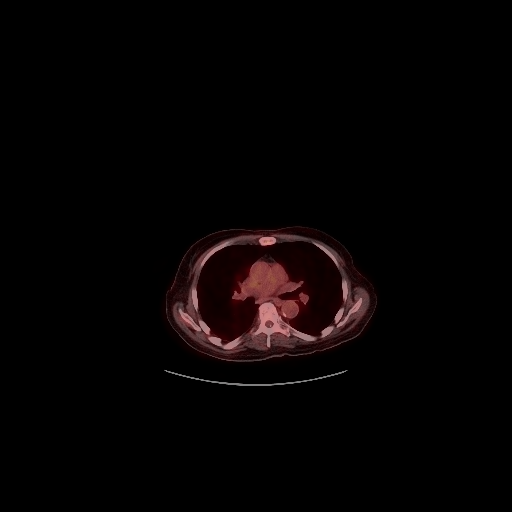
[im 192/288]
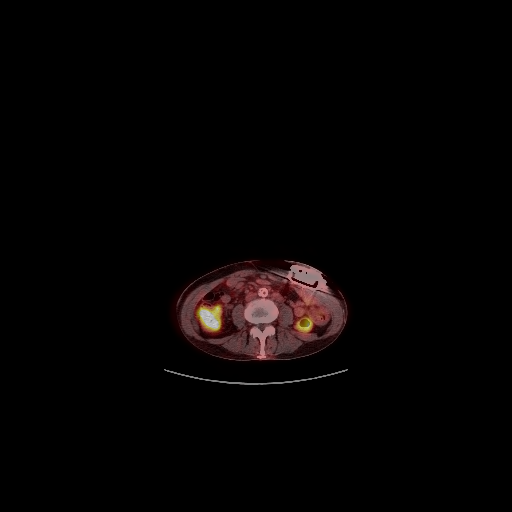

[Series 604: fused coronal · 2 of 118 slices shown]
[im 1/118]
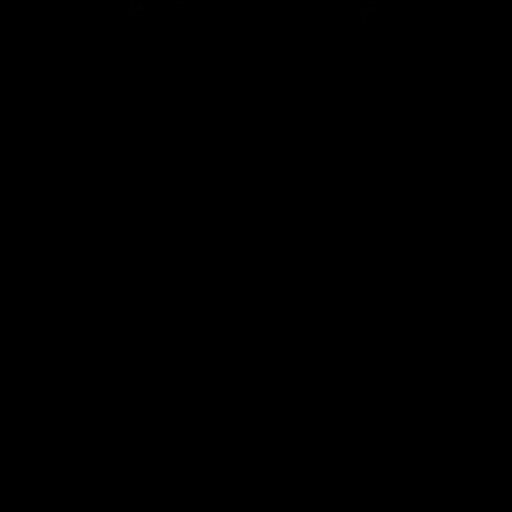
[im 118/118]
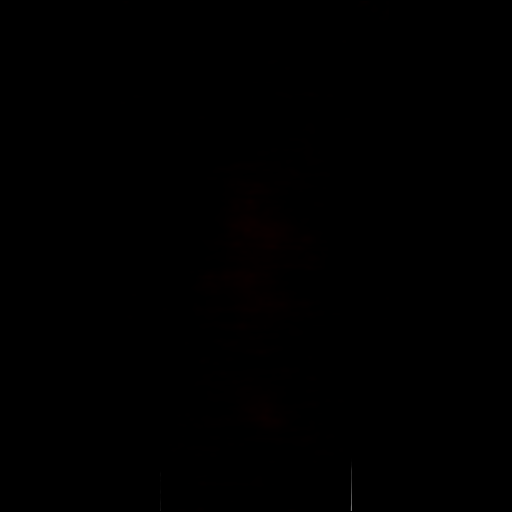

[Series 605: fused sagittal · 2 of 143 slices shown]
[im 1/143]
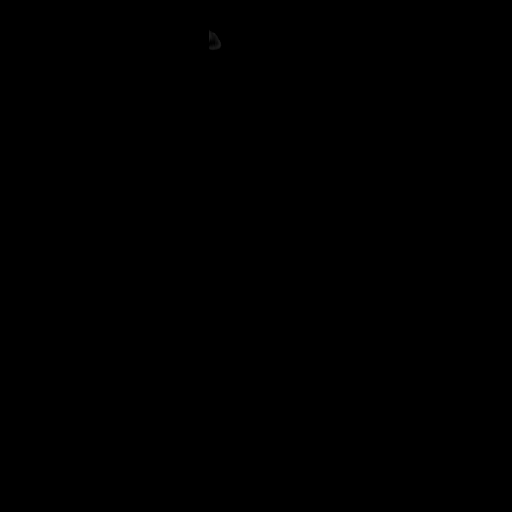
[im 143/143]
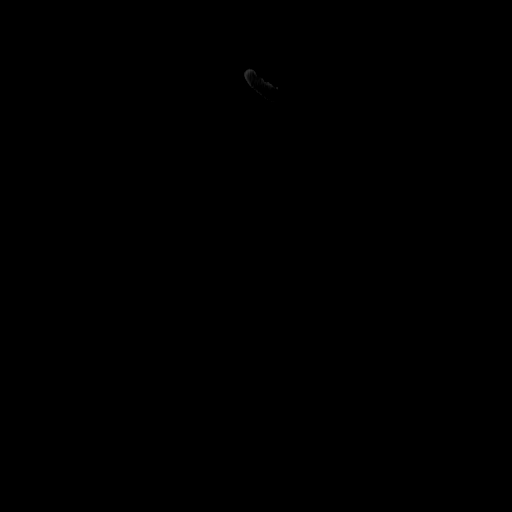

[Series 606: pet axial · 4 of 288 slices shown]
[im 1/288]
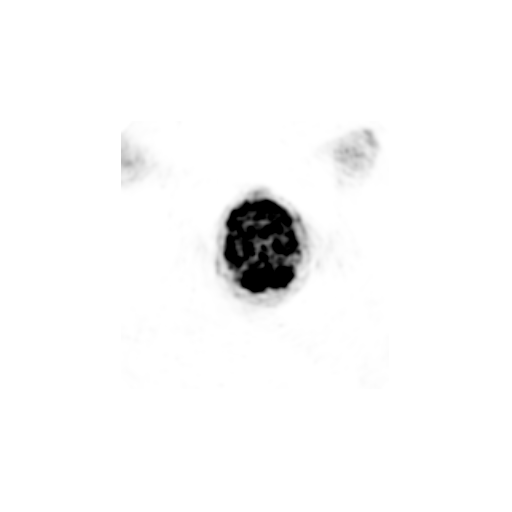
[im 96/288]
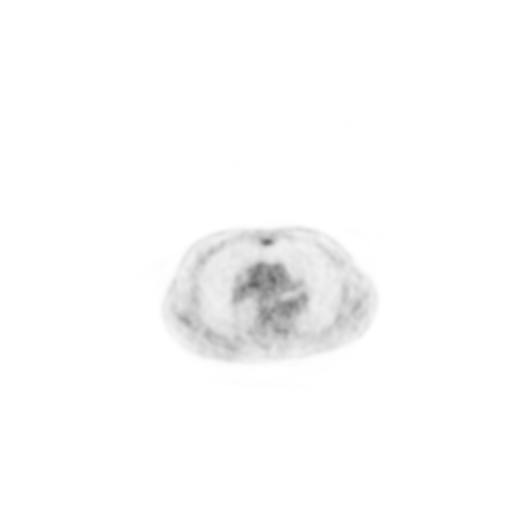
[im 192/288]
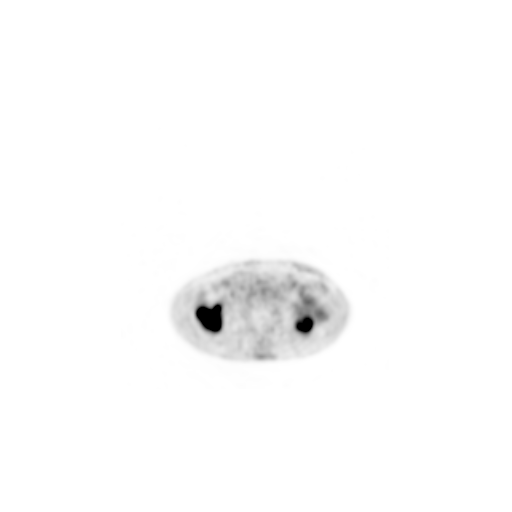
[im 288/288]
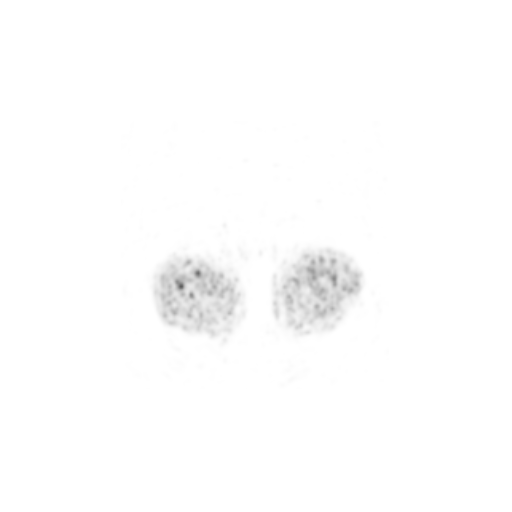

[Series 607: pet coronal · 1 of 103 slices shown]
[im 1/103]
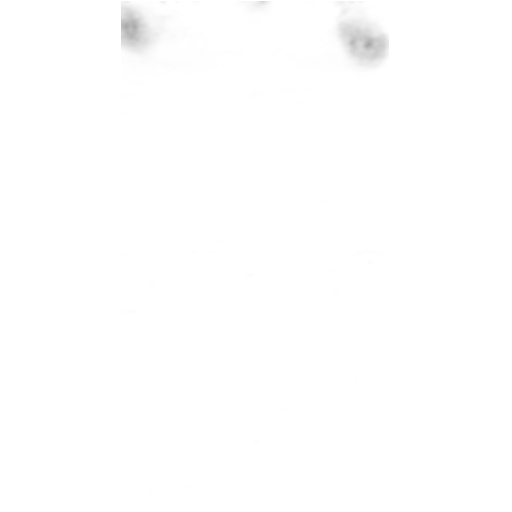

[Series 608: pet sagittal · 2 of 152 slices shown]
[im 1/152]
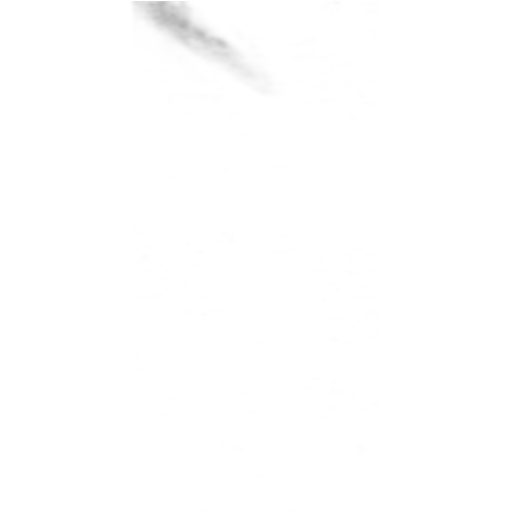
[im 152/152]
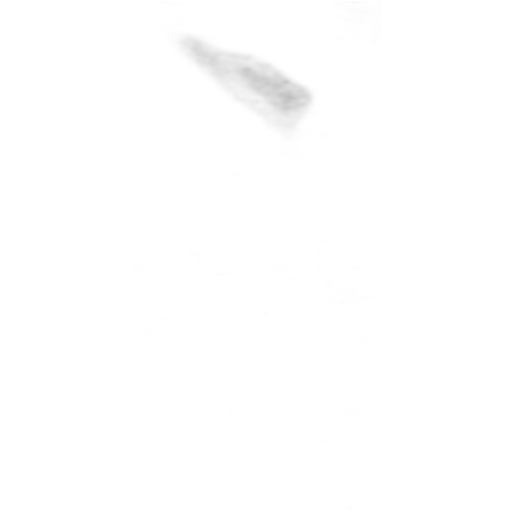

[Series 1394: results mm oncology reading · 1.0mm · 0.89mm/px · 1 of 4 slices shown]
[im 1/4]
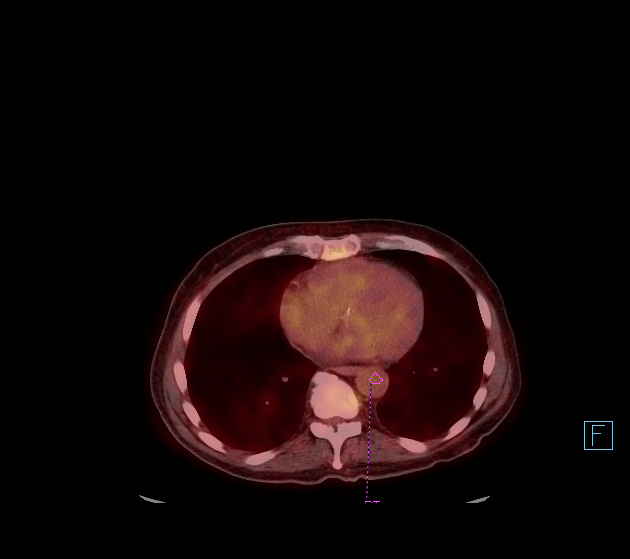

[24 of 25 positions shown; findings below may reference images not displayed]

FINDINGS: Mediastinal blood pool activity: SUV max

Liver activity: SUV max

NECK: Mild supraclavicular adenopathy unchanged from prior. No
significant metabolic activity.

Incidental CT findings: none

CHEST: Mild mediastinal adenopathy similar prior. Example precarinal
node measures 12 mm unchanged. No significant metabolic activity
(SUV max equal 1.7).

Incidental CT findings: No suspicious pulmonary non

ABDOMEN/PELVIS: spleen is normal volume and normal metabolic
activity.

Periaortic retroperitoneal adenopathy again demonstrated. Lymph node
complex LEFT aorta at the level of the kidneys measures 32 mm
compared to 33 mm (image 175). Metabolic activity similar to blood
pool (SUV max equal 1.6). No new adenopathy.

No pelvic or inguinal lymphadenopathy.

No abnormal activity in the liver.

Incidental CT findings: Spinal stimulation generator pack in the
LEFT lower quadrant.

SKELETON: No aggressive osseous lesion.

Incidental CT findings: none
IMPRESSION: 1. No evidence of active lymphoma by FDG PET scan.
2. Stable mild mediastinal adenopathy and moderate periaortic
retroperitoneal adenopathy. No significant metabolic activity in
these nodes.
3. No evidence of new adenopathy.
4. Normal spleen with normal metabolic activity.
5. No skeletal metastasis.
6. No interval change from comparison CT 01/24/2020.

## 2022-02-25 ENCOUNTER — Encounter (INDEPENDENT_AMBULATORY_CARE_PROVIDER_SITE_OTHER): Payer: Self-pay

## 2022-04-29 DEATH — deceased

## 2024-01-12 ENCOUNTER — Encounter: Payer: Self-pay | Admitting: Internal Medicine
